# Patient Record
Sex: Female | Born: 1945 | Race: White | Hispanic: No | Marital: Married | Smoking: Never smoker
Health system: Southern US, Community
[De-identification: ages and names within clinical notes are randomized; demographics above are authoritative.]

## PROBLEM LIST (undated history)

## (undated) DIAGNOSIS — E039 Hypothyroidism, unspecified: Secondary | ICD-10-CM

## (undated) DIAGNOSIS — I10 Essential (primary) hypertension: Secondary | ICD-10-CM

## (undated) DIAGNOSIS — H269 Unspecified cataract: Secondary | ICD-10-CM

## (undated) DIAGNOSIS — H35039 Hypertensive retinopathy, unspecified eye: Secondary | ICD-10-CM

## (undated) DIAGNOSIS — M810 Age-related osteoporosis without current pathological fracture: Secondary | ICD-10-CM

## (undated) DIAGNOSIS — N200 Calculus of kidney: Secondary | ICD-10-CM

## (undated) DIAGNOSIS — M1990 Unspecified osteoarthritis, unspecified site: Secondary | ICD-10-CM

## (undated) HISTORY — PX: OTHER SURGICAL HISTORY: SHX169

## (undated) HISTORY — PX: TUBAL LIGATION: SHX77

## (undated) HISTORY — DX: Unspecified cataract: H26.9

## (undated) HISTORY — PX: CATARACT EXTRACTION: SUR2

## (undated) HISTORY — PX: EYE SURGERY: SHX253

## (undated) HISTORY — PX: ABDOMINAL HYSTERECTOMY: SHX81

## (undated) HISTORY — DX: Age-related osteoporosis without current pathological fracture: M81.0

## (undated) HISTORY — PX: COLONOSCOPY: SHX174

## (undated) HISTORY — DX: Hypertensive retinopathy, unspecified eye: H35.039

---

## 1970-10-11 HISTORY — PX: CHOLECYSTECTOMY: SHX55

## 2003-09-17 DIAGNOSIS — R319 Hematuria, unspecified: Secondary | ICD-10-CM | POA: Insufficient documentation

## 2004-12-04 DIAGNOSIS — N951 Menopausal and female climacteric states: Secondary | ICD-10-CM | POA: Insufficient documentation

## 2004-12-04 DIAGNOSIS — M8000XD Age-related osteoporosis with current pathological fracture, unspecified site, subsequent encounter for fracture with routine healing: Secondary | ICD-10-CM | POA: Insufficient documentation

## 2011-05-17 ENCOUNTER — Other Ambulatory Visit: Payer: Self-pay | Admitting: Urology

## 2011-05-17 DIAGNOSIS — N200 Calculus of kidney: Secondary | ICD-10-CM

## 2011-05-31 ENCOUNTER — Other Ambulatory Visit: Payer: Self-pay | Admitting: Urology

## 2011-05-31 DIAGNOSIS — N200 Calculus of kidney: Secondary | ICD-10-CM

## 2011-06-01 ENCOUNTER — Other Ambulatory Visit: Payer: Self-pay | Admitting: Urology

## 2011-06-01 ENCOUNTER — Encounter (HOSPITAL_COMMUNITY): Payer: BC Managed Care – PPO

## 2011-06-01 LAB — CBC
Hemoglobin: 12.3 g/dL (ref 12.0–15.0)
MCH: 29.6 pg (ref 26.0–34.0)
MCV: 87.7 fL (ref 78.0–100.0)
Platelets: 223 10*3/uL (ref 150–400)
RBC: 4.15 MIL/uL (ref 3.87–5.11)
WBC: 7.3 10*3/uL (ref 4.0–10.5)

## 2011-06-01 LAB — SURGICAL PCR SCREEN
MRSA, PCR: POSITIVE — AB
Staphylococcus aureus: POSITIVE — AB

## 2011-06-01 LAB — BASIC METABOLIC PANEL
CO2: 27 mEq/L (ref 19–32)
Calcium: 10 mg/dL (ref 8.4–10.5)
Chloride: 108 mEq/L (ref 96–112)
Glucose, Bld: 67 mg/dL — ABNORMAL LOW (ref 70–99)
Sodium: 144 mEq/L (ref 135–145)

## 2011-06-07 ENCOUNTER — Ambulatory Visit (HOSPITAL_COMMUNITY)
Admission: RE | Admit: 2011-06-07 | Discharge: 2011-06-07 | Disposition: A | Discharge status: Home / Self Care | Payer: BC Managed Care – PPO | Source: Ambulatory Visit | Attending: Urology | Admitting: Urology

## 2011-06-07 ENCOUNTER — Ambulatory Visit (HOSPITAL_COMMUNITY): Payer: BC Managed Care – PPO

## 2011-06-07 ENCOUNTER — Inpatient Hospital Stay (HOSPITAL_COMMUNITY)
Admission: RE | Admit: 2011-06-07 | Discharge: 2011-06-10 | DRG: 305 | Disposition: A | Discharge status: Home / Self Care | Payer: BC Managed Care – PPO | Source: Ambulatory Visit | Attending: Urology | Admitting: Urology

## 2011-06-07 DIAGNOSIS — N200 Calculus of kidney: Secondary | ICD-10-CM

## 2011-06-07 DIAGNOSIS — E7800 Pure hypercholesterolemia, unspecified: Secondary | ICD-10-CM | POA: Diagnosis present

## 2011-06-07 DIAGNOSIS — R339 Retention of urine, unspecified: Secondary | ICD-10-CM | POA: Diagnosis not present

## 2011-06-07 DIAGNOSIS — I10 Essential (primary) hypertension: Secondary | ICD-10-CM | POA: Diagnosis present

## 2011-06-07 DIAGNOSIS — Z01810 Encounter for preprocedural cardiovascular examination: Secondary | ICD-10-CM

## 2011-06-07 DIAGNOSIS — Z01812 Encounter for preprocedural laboratory examination: Secondary | ICD-10-CM

## 2011-06-07 LAB — BASIC METABOLIC PANEL
BUN: 14 mg/dL (ref 6–23)
Chloride: 108 mEq/L (ref 96–112)
GFR calc Af Amer: >60 mL/min (ref >60)
GFR calc non Af Amer: >60 mL/min (ref >60)
Potassium: 4.1 mEq/L (ref 3.5–5.1)
Sodium: 138 mEq/L (ref 135–145)

## 2011-06-07 LAB — TYPE AND SCREEN
ABO/RH(D): A POS
Antibody Screen: NEGATIVE

## 2011-06-07 LAB — ABO/RH: ABO/RH(D): A POS

## 2011-06-07 LAB — CBC
HCT: 29.4 % — ABNORMAL LOW (ref 36.0–46.0)
MCHC: 34.7 g/dL (ref 30.0–36.0)
Platelets: 185 10*3/uL (ref 150–400)
RDW: 13 % (ref 11.5–15.5)
WBC: 12.1 10*3/uL — ABNORMAL HIGH (ref 4.0–10.5)

## 2011-06-07 MED ORDER — IOHEXOL 300 MG/ML  SOLN
100.0000 mL | Freq: Once | INTRAMUSCULAR | Status: AC | PRN
  Administered 2011-06-07: 60 mL via INTRATHECAL

## 2011-06-08 ENCOUNTER — Ambulatory Visit (HOSPITAL_COMMUNITY): Payer: BC Managed Care – PPO

## 2011-06-08 LAB — BASIC METABOLIC PANEL
Chloride: 105 mEq/L (ref 96–112)
GFR calc Af Amer: >60 mL/min (ref >60)
GFR calc non Af Amer: 51 mL/min — ABNORMAL LOW (ref >60)
Glucose, Bld: 197 mg/dL — ABNORMAL HIGH (ref 70–99)
Potassium: 4.5 mEq/L (ref 3.5–5.1)
Sodium: 137 mEq/L (ref 135–145)

## 2011-06-08 LAB — CBC
Hemoglobin: 8.9 g/dL — ABNORMAL LOW (ref 12.0–15.0)
MCHC: 35.6 g/dL (ref 30.0–36.0)
WBC: 9.1 10*3/uL (ref 4.0–10.5)

## 2011-06-25 ENCOUNTER — Other Ambulatory Visit: Payer: Self-pay | Admitting: Urology

## 2011-06-25 ENCOUNTER — Encounter (HOSPITAL_COMMUNITY): Payer: BC Managed Care – PPO

## 2011-06-25 LAB — BASIC METABOLIC PANEL
CO2: 26 mEq/L (ref 19–32)
Glucose, Bld: 72 mg/dL (ref 70–99)
Potassium: 3.9 mEq/L (ref 3.5–5.1)
Sodium: 144 mEq/L (ref 135–145)

## 2011-06-25 LAB — CBC
HCT: 24.7 % — ABNORMAL LOW (ref 36.0–46.0)
Hemoglobin: 8.1 g/dL — ABNORMAL LOW (ref 12.0–15.0)
MCH: 28.2 pg (ref 26.0–34.0)
RBC: 2.87 MIL/uL — ABNORMAL LOW (ref 3.87–5.11)

## 2011-06-30 ENCOUNTER — Ambulatory Visit (HOSPITAL_COMMUNITY)
Admission: RE | Admit: 2011-06-30 | Discharge: 2011-06-30 | Disposition: A | Discharge status: Home / Self Care | Payer: BC Managed Care – PPO | Source: Ambulatory Visit | Attending: Urology | Admitting: Urology

## 2011-06-30 ENCOUNTER — Ambulatory Visit (HOSPITAL_COMMUNITY): Payer: BC Managed Care – PPO

## 2011-06-30 DIAGNOSIS — N200 Calculus of kidney: Secondary | ICD-10-CM | POA: Insufficient documentation

## 2011-06-30 DIAGNOSIS — I10 Essential (primary) hypertension: Secondary | ICD-10-CM | POA: Insufficient documentation

## 2011-06-30 DIAGNOSIS — N1330 Unspecified hydronephrosis: Secondary | ICD-10-CM | POA: Insufficient documentation

## 2011-06-30 DIAGNOSIS — Z01812 Encounter for preprocedural laboratory examination: Secondary | ICD-10-CM | POA: Insufficient documentation

## 2011-06-30 LAB — TYPE AND SCREEN: Antibody Screen: NEGATIVE

## 2011-07-01 NOTE — Op Note (Signed)
NAME:  LATANZA, PFEFFERKORN NO.:  [id number]  MEDICAL RECORD NO.:  [id number]  LOCATION:  DAYL                         FACILITY:  [facility]  PHYSICIAN:  Valetta Fuller, MD    DATE OF BIRTH:  Oct 25, 1945  DATE OF PROCEDURE: DATE OF DISCHARGE:                              OPERATIVE REPORT   PREOPERATIVE DIAGNOSIS:  Left renal calculi.  POSTOPERATIVE DIAGNOSIS:  Left renal calculi.  PROCEDURE PERFORMED:  Second-look nephroscopy with percutaneous nephrolithotomy by a holmium laser lithotripsy.  SURGEON:  Valetta Fuller, MD  ANESTHESIA:  General.  INDICATIONS:  Ms. Bowler is 65 years of age.  The patient was sent to see [place] for further evaluation and treatment of multiple left renal calculi.  The largest stone was approximately 2 cm in her left renal pelvis with mild hydronephrosis.  She had additional stone burden as well.  We felt that given the amount of stone burden she had, she would be a better candidate for percutaneous approach versus lithotripsy.  The patient underwent a percutaneous nephrolithotomy approximately 3 weeks ago.  At the time of surgery, we were able to debulk the stone considerably.  There was some migration of the stone into an upper pole position and unfortunately access was limited and visualization poor near the completion of the procedure.  We felt it would be better to complete the procedure at a later date.  The nephrostomy tube was left and the patient was able to be discharged home.  She does have a double- J stent and as well as a nephrostomy tube that has been plugged for the last several weeks.  Clinically, she has done rather well.  Followup imaging revealed primarily a single, approximately 10 cm fragment now in the upper aspect of the kidney along with a tiny stone in the lower pole.  She presents now for attempted completion of her definitive stone management.  TECHNIQUE AND FINDINGS:  The patient was brought to the operating  room. She had successful induction of general endotracheal anesthesia and was then placed in a prone position and prepped draped in the usual manner. All extremities carefully padded.  She had PAS compression boots and received perioperative gentamicin.  She was prepped draped in this usual manner.  Appropriate surgical time-out was performed.  The nephrostomy tube was removed.  The nephrostomy track was then visualized with a flexible cystoscope.  Visualization was excellent.  In the upper aspect of the pelvis, there was an adherent fragment measuring approximately 10 mm.  A smaller 3 mm stone was encountered in the lower pole, which was basket extracted.  The largest stone was broken up into innumerable pieces utilizing a holmium laser lithotriptor fiber.  The largest 6 to 8 fragments were basket extracted and at the completion of the procedure, it appeared that she only had some tiny 1-2 mm fragments.  The double-J stent was left indwelling. Her nephrostomy tube was removed.  There was no active bleeding or other complications and the patient was brought to recovery room in stable condition.     Valetta Fuller, MD     DSG/MEDQ  D:  06/30/2011  T:  06/30/2011  Job:  [id number]  Electronically Signed by Barron Alvine M.D. on 07/01/2011 04:52:57 PM

## 2011-07-01 NOTE — Op Note (Signed)
NAME:  Meredith Shaw, Meredith Shaw NO.:  [id number]  MEDICAL RECORD NO.:  [id number]  LOCATION:  1442                         FACILITY:  [facility]  PHYSICIAN:  Valetta Fuller, MD    DATE OF BIRTH:  [birth year]  DATE OF PROCEDURE:  06/07/2011 DATE OF DISCHARGE:                              OPERATIVE REPORT   PREOPERATIVE DIAGNOSIS:  Multiple left renal calculi.  POSTOPERATIVE DIAGNOSIS:  Multiple left renal calculi.  PROCEDURE PERFORMED:  Left percutaneous nephrolithotomy.  SURGEON:  Valetta Fuller, MD  ASSISTANT:  Jodi Marble. Fredia Sorrow, MD, from Interventional Radiology.  ANESTHESIA:  General endotracheal.  INDICATIONS:  Meredith Shaw is 65 years of age.  She was sent to see [place] for discussion with regard to management options for multiple left renal calculi.  The patient originally had evaluation at [facility]. She had been complaining of intermittent left flank pain.  A CT at that time showed approximately a 1 x 2 cm stone in her left renal pelvis with mild hydronephrosis and an additional 1 cm stone in the lower pole of her kidney.  Overall, she was felt to have approximately 3 cm stone burden in the left kidney with minimal dilation and some intermittent discomfort.  We discussed with her the options for management.  We felt that the procedure that had the best opportunity to render her stone- free was a percutaneous approach.  We discussed that along with the options of lithotripsy plus or minus double-J stent.  After discussion, she elected to proceed with a percutaneous approach.  She appeared to understand the advantages and disadvantages, potential complications of this.  The morning of the surgery, the patient went to Interventional Radiology where access was obtained.  Dr. Fredia Sorrow did have some difficulty obtaining proper access and there was some extravasation of contrast.  Nephrostomy tube did, however, appeared to be well positioned.  The patient  received perioperative antibiotics with ciprofloxacin and placement of PAS compression boots.  TECHNIQUE AND FINDINGS:  The patient was brought to the operating room where she had successful induction of general endotracheal anesthesia. Foley catheter was placed.  The patient was then placed in the prone position, taking great care to pad all extremities.  She was then prepped and draped in the usual manner.  [facility] then came in to the operating room.  Dr. Fredia Sorrow placed a wire down to the bladder.  A peel-away sheath was placed and a second safety wire was placed.  Fascial dilating balloon was then utilized and the access sheath was placed into the left kidney.  With that, a fair amount of blood was noted emanating from the access sheath.  For that reason, the balloon was reinserted.  This was held with tamponade pressure for several minutes and the sheath was then advanced further.  With deflation of the balloon, there did not appear to be any ongoing significant bleeding, but just some oozing.  The nephroscope was then placed.  The guidewire was followed and we initially encountered the stone in the lower pole.  This was broken up with the lithotrite instrument and pieces were removed with a three-pronged grasper.  The larger stone in the renal pelvis  was then encountered.  The access was just beneath the stone.  Some torquing of the scope and access sheath was necessary.  We were able to debulk the stone, but a portion of the stone did appear to migrate into a more upper pole position and access was not obtainable.  We felt this stone probably could be accessed with flexible instrumentation, but at that point there was a moderate amount of oozing and bleeding and visualization was poor.  For that reason, we felt that that would be better dealt with another day either by lithotripsy or second look.  Over one of the safety wires down to her bladder, a double-J stent  was placed.  A nephrostomy tube was then utilized which was a 20-French Councill tip Foley.  There was some extravasation of contrast on the nephrogram, but the stent and nephrostomy tube appeared to be well positioned.  There did not appear to be any significant ongoing bleeding.  The nephrostomy tube was secured to her skin and hooked up to gravity drainage.  The patient was brought to recovery room in stable condition.  Estimated blood loss was 700 cc.  No other complications or problems occurred.     Valetta Fuller, MD     DSG/MEDQ  D:  06/08/2011  T:  06/09/2011  Job:  [id number]  Electronically Signed by Barron Alvine M.D. on 07/01/2011 04:55:49 PM

## 2011-07-04 NOTE — H&P (Signed)
  NAME:  Meredith Shaw, Meredith Shaw NO.:  [id number]  MEDICAL RECORD NO.:  [id number]  LOCATION:  DAYL                         FACILITY:  [facility]  PHYSICIAN:  Valetta Fuller, MD    DATE OF BIRTH:  04/24/1946  DATE OF ADMISSION:  06/30/2011 DATE OF DISCHARGE:  06/30/2011                             HISTORY & PHYSICAL   ADMISSION DIAGNOSIS:  Left renal calculi.  HISTORY OF PRESENT ILLNESS:  Meredith Shaw was sent to [place] from an outside physician in clinic for consideration for percutaneous management of multiple left renal calculi.  She is 65 years of age.  The patient was noted to have multiple left renal calculi, the largest was approximately 2 cm in size.  We felt the stone burden would be best approached with the percutaneous approach and she accepted that recommendation.  The patient had placement of a percutaneous nephrostomy tube by Interventional Radiology earlier today and is going to undergo a percutaneous nephrolithotomy with admission to the hospital for routine postoperative care.  She has had some mild intermittent left flank discomfort.  She has had no other systemic complaints.  PAST MEDICAL HISTORY:  Notable for; 1. Hypertension. 2. Hypothyroidism.  MEDICATIONS:  Include Vicodin, Levothroid, metoprolol, and variety of supplements which have been on hold.  ALLERGIES:  PENICILLIN, which results in rash.  SOCIAL HISTORY:  Noncontributory.  REVIEW OF SYSTEMS:  Positive for mild left intermittent flank pain, mild bladder overactivity.  No other systemic complaints.  PHYSICAL EXAMINATION:  GENERAL:  The patient is well-developed, well- nourished female in no acute distress.  She is afebrile. VITAL SIGNS:  Her blood pressure was 186/98 with a pulse of 59, respiratory rate was 16. NECK:  Showed no gross JVD. RESPIRATORY:  Effort was normal. HEART:  Regular rate and rhythm. ABDOMEN:  Obese, but soft, nontender.  Left side nephrostomy tube in good  position. EXTREMITIES:  No gross edema.  ASSESSMENT:  Multiple left renal calculi.  The patient has had a percutaneous nephrostomy tube placed.  She will undergo attempted definitive stone management today with admission to the hospital for routine postoperative care.     Valetta Fuller, MD     DSG/MEDQ  D:  07/02/2011  T:  07/02/2011  Job:  [id number]  Electronically Signed by Barron Alvine M.D. on 07/04/2011 07:25:22 PM

## 2011-07-04 NOTE — Discharge Summary (Signed)
  NAME:  Meredith Shaw, Meredith Shaw NO.:  [id number]  MEDICAL RECORD NO.:  [id number]  LOCATION:  1442                         FACILITY:  [facility]  PHYSICIAN:  Valetta Fuller, MD    DATE OF BIRTH:  [birth year]  DATE OF ADMISSION:  06/07/2011 DATE OF DISCHARGE:  06/10/2011                              DISCHARGE SUMMARY   DISCHARGE DIAGNOSES: 1. Left renal calculi (multiple). 2. Acute urinary retention (postoperative). 3. Hypertension.  PROCEDURES PERFORMED: 1. Placement of percutaneous left nephrostomy tube by Interventional     Radiology on June 07, 2011. 2. Left percutaneous nephrolithotomy on June 07, 2011.  HOSPITAL COURSE:  The patient had placement of a percutaneous left nephrostomy tube by Dr. Irish Lack from Interventional Radiology. The patient was subsequently taken to the operating room for attempted left percutaneous nephrolithotomy.  The patient had multiple left renal calculi.  Smaller stones were dealt with effectively.  The largest stone was partially fragmented, but fragment in the upper aspect was unable to be completely dealt with at the initial setting.  We did not feel that visualization with flexible instruments would be feasible due to bleeding and therefore, we felt that she would require a second-look procedure to definitively manage her situation.  The nephrostomy tube was placed.  The patient had a relatively uneventful postoperative course other than ongoing issues with urinary retention.  On postoperative day 1, she still complained of some flank discomfort, but remained afebrile with stable vital signs.  Postoperative hemoglobin was 8.9.  She continued to have excellent urinary output.  Follow-up CT revealed approximately a residual 10-mm fragment in her left kidney. The patient's nephrostomy tube was clamped and she was eventually able to tolerate that.  The patient unfortunately was unable to void successfully after removal of her  Foley catheter.  Flomax was added and the patient was continued on intermittent catheterization.  She continued to not be able to void successfully and therefore, we elected to discharge her on June 10, 2011 with an indwelling Foley catheter with plans to perform a voiding trial in our office in a few days and also to schedule her for a return visit for a second look to definitively manage the residual stone fragment.  DISPOSITION:  The patient was discharged home.  She is to resume normal medications.  She will take Vicodin for pain management.  Arrangements will be made for both follow-up in our office for voiding trial as well as for additional definitive surgical intervention.     Valetta Fuller, MD    DSG/MEDQ  D:  07/02/2011  T:  07/02/2011  Job:  [id number]  Electronically Signed by Barron Alvine M.D. on 07/04/2011 07:25:10 PM

## 2012-05-10 ENCOUNTER — Other Ambulatory Visit (HOSPITAL_COMMUNITY): Payer: Self-pay | Admitting: Orthopaedic Surgery

## 2012-05-16 DIAGNOSIS — N3020 Other chronic cystitis without hematuria: Secondary | ICD-10-CM | POA: Diagnosis not present

## 2012-05-16 DIAGNOSIS — N200 Calculus of kidney: Secondary | ICD-10-CM | POA: Diagnosis not present

## 2012-05-30 ENCOUNTER — Encounter (HOSPITAL_COMMUNITY): Payer: Self-pay | Admitting: Pharmacy Technician

## 2012-06-05 ENCOUNTER — Ambulatory Visit (HOSPITAL_COMMUNITY)
Admission: RE | Admit: 2012-06-05 | Discharge: 2012-06-05 | Disposition: A | Discharge status: Home / Self Care | Payer: BC Managed Care – PPO | Source: Ambulatory Visit | Attending: Orthopaedic Surgery | Admitting: Orthopaedic Surgery

## 2012-06-05 ENCOUNTER — Encounter (HOSPITAL_COMMUNITY): Payer: Self-pay

## 2012-06-05 ENCOUNTER — Encounter (HOSPITAL_COMMUNITY)
Admission: RE | Admit: 2012-06-05 | Discharge: 2012-06-05 | Disposition: A | Discharge status: Home / Self Care | Payer: BC Managed Care – PPO | Source: Ambulatory Visit | Attending: Orthopaedic Surgery | Admitting: Orthopaedic Surgery

## 2012-06-05 DIAGNOSIS — I10 Essential (primary) hypertension: Secondary | ICD-10-CM | POA: Diagnosis not present

## 2012-06-05 DIAGNOSIS — M1710 Unilateral primary osteoarthritis, unspecified knee: Secondary | ICD-10-CM | POA: Diagnosis not present

## 2012-06-05 DIAGNOSIS — Z01810 Encounter for preprocedural cardiovascular examination: Secondary | ICD-10-CM | POA: Diagnosis not present

## 2012-06-05 DIAGNOSIS — Z01812 Encounter for preprocedural laboratory examination: Secondary | ICD-10-CM | POA: Insufficient documentation

## 2012-06-05 DIAGNOSIS — Z01818 Encounter for other preprocedural examination: Secondary | ICD-10-CM | POA: Diagnosis not present

## 2012-06-05 HISTORY — DX: Unspecified osteoarthritis, unspecified site: M19.90

## 2012-06-05 HISTORY — DX: Calculus of kidney: N20.0

## 2012-06-05 HISTORY — DX: Hypothyroidism, unspecified: E03.9

## 2012-06-05 HISTORY — DX: Essential (primary) hypertension: I10

## 2012-06-05 LAB — URINALYSIS, ROUTINE W REFLEX MICROSCOPIC
Glucose, UA: NEGATIVE mg/dL
Ketones, ur: NEGATIVE mg/dL
Nitrite: NEGATIVE
Specific Gravity, Urine: 1.026 (ref 1.005–1.030)
pH: 5 (ref 5.0–8.0)

## 2012-06-05 LAB — URINE MICROSCOPIC-ADD ON

## 2012-06-05 LAB — CBC
HCT: 36 % (ref 36.0–46.0)
Hemoglobin: 12.7 g/dL (ref 12.0–15.0)
MCV: 86.5 fL (ref 78.0–100.0)
RBC: 4.16 MIL/uL (ref 3.87–5.11)
RDW: 13.1 % (ref 11.5–15.5)
WBC: 6.7 10*3/uL (ref 4.0–10.5)

## 2012-06-05 LAB — BASIC METABOLIC PANEL
CO2: 26 mEq/L (ref 19–32)
Chloride: 102 mEq/L (ref 96–112)
Creatinine, Ser: 1.14 mg/dL — ABNORMAL HIGH (ref 0.50–1.10)
GFR calc Af Amer: 57 mL/min — ABNORMAL LOW (ref >90)
Potassium: 4.3 mEq/L (ref 3.5–5.1)
Sodium: 137 mEq/L (ref 135–145)

## 2012-06-05 LAB — PROTIME-INR: INR: 1 (ref 0.00–1.49)

## 2012-06-05 LAB — SURGICAL PCR SCREEN
MRSA, PCR: NEGATIVE
Staphylococcus aureus: NEGATIVE

## 2012-06-05 NOTE — Pre-Procedure Instructions (Signed)
PREOP CBC, BMET, PT, UA, T/S, EKG, CXR WERE DONE TODAY AT [facility]. PREOP TEACHING WAS DONE USING TEACH BACK METHOD.

## 2012-06-05 NOTE — Pre-Procedure Instructions (Signed)
SHERRY AT DR. C. BLACKMAN'S OFFICE NOTIFIED TO ASK DR. BLACKMAN TO REVIEW PT'S URINALYSIS AND BMET REPORTS IN EPIC FOR ABNORMAL'S.

## 2012-06-05 NOTE — Patient Instructions (Signed)
YOUR SURGERY IS SCHEDULED ON:   Friday  8/30 AT 2:36 PM  REPORT TO [place] [facility] AT:  12:00 PM      PHONE # FOR SHORT STAY IS [phone]  DO NOT EAT ANYTHING AFTER MIDNIGHT THE NIGHT BEFORE YOUR SURGERY.   NO FOOD, NO CHEWING GUM, NO MINTS, NO CANDIES, NO CHEWING TOBACCO.  YOU MAY HAVE CLEAR LIQUIDS TO DRINK FROM MIDNIGHT UNTIL 8:30 AM DAY OF SURGERY--LIKE WATER, APPLE JUICE, SODA.  NOTHING TO DRINK AFTER 8:30 AM THE DAY OF SURGERY.  PLEASE TAKE THE FOLLOWING MEDICATIONS THE AM OF YOUR SURGERY WITH A FEW SIPS OF WATER:  LEVOTHYROXINE, METOPROLOL.    IF YOU USE INHALERS--USE YOUR INHALERS THE AM OF YOUR SURGERY AND BRING INHALERS TO THE HOSPITAL -TAKE TO SURGERY.    IF YOU ARE DIABETIC:  DO NOT TAKE ANY DIABETIC MEDICATIONS THE AM OF YOUR SURGERY.  IF YOU TAKE INSULIN IN THE EVENINGS--PLEASE ONLY TAKE 1/2 NORMAL EVENING DOSE THE NIGHT BEFORE YOUR SURGERY.  NO INSULIN THE AM OF YOUR SURGERY.  IF YOU HAVE SLEEP APNEA AND USE CPAP OR BIPAP--PLEASE BRING THE MASK --NOT THE MACHINE-NOT THE TUBING   -JUST THE MASK. DO NOT BRING VALUABLES, MONEY, CREDIT CARDS.  CONTACT LENS, DENTURES / PARTIALS, GLASSES SHOULD NOT BE WORN TO SURGERY AND IN MOST CASES-HEARING AIDS WILL NEED TO BE REMOVED.  BRING YOUR GLASSES CASE, ANY EQUIPMENT NEEDED FOR YOUR CONTACT LENS. FOR PATIENTS ADMITTED TO THE HOSPITAL--CHECK OUT TIME THE DAY OF DISCHARGE IS 11:00 AM.  ALL INPATIENT ROOMS ARE PRIVATE - WITH BATHROOM, TELEPHONE, TELEVISION AND WIFI INTERNET. IF YOU ARE BEING DISCHARGED THE SAME DAY OF YOUR SURGERY--YOU CAN NOT DRIVE YOURSELF HOME--AND SHOULD NOT GO HOME ALONE BY TAXI OR BUS.  NO DRIVING OR OPERATING MACHINERY FOR 24 HOURS FOLLOWING ANESTHESIA / PAIN MEDICATIONS.                            SPECIAL INSTRUCTIONS:  CHLORHEXIDINE SOAP SHOWER (other brand names are Betasept and Hibiclens ) PLEASE SHOWER WITH CHLORHEXIDINE THE NIGHT BEFORE YOUR SURGERY AND THE AM OF YOUR SURGERY. DO NOT USE  CHLORHEXIDINE ON YOUR FACE OR PRIVATE AREAS--YOU MAY USE YOUR NORMAL SOAP THOSE AREAS AND YOUR NORMAL SHAMPOO.  WOMEN SHOULD AVOID SHAVING UNDER ARMS AND SHAVING LEGS 48 HOURS BEFORE USING CHLORHEXIDINE TO AVOID SKIN IRRITATION.  DO NOT USE IF ALLERGIC TO CHLORHEXIDINE.  PLEASE READ OVER ANY  FACT SHEETS THAT YOU WERE GIVEN: MRSA INFORMATION, BLOOD TRANSFUSION INFORMATION

## 2012-06-09 ENCOUNTER — Inpatient Hospital Stay (HOSPITAL_COMMUNITY): Payer: BC Managed Care – PPO | Admitting: Anesthesiology

## 2012-06-09 ENCOUNTER — Encounter (HOSPITAL_COMMUNITY): Payer: Self-pay | Admitting: Anesthesiology

## 2012-06-09 ENCOUNTER — Encounter (HOSPITAL_COMMUNITY): Payer: Self-pay | Admitting: *Deleted

## 2012-06-09 ENCOUNTER — Inpatient Hospital Stay (HOSPITAL_COMMUNITY)
Admission: RE | Admit: 2012-06-09 | Discharge: 2012-06-12 | DRG: 209 | Disposition: A | Discharge status: Home / Self Care | Payer: BC Managed Care – PPO | Source: Ambulatory Visit | Attending: Orthopaedic Surgery | Admitting: Orthopaedic Surgery

## 2012-06-09 ENCOUNTER — Inpatient Hospital Stay (HOSPITAL_COMMUNITY): Payer: BC Managed Care – PPO

## 2012-06-09 ENCOUNTER — Encounter (HOSPITAL_COMMUNITY)
Admission: RE | Disposition: A | Discharge status: Home / Self Care | Payer: Self-pay | Source: Ambulatory Visit | Attending: Orthopaedic Surgery

## 2012-06-09 DIAGNOSIS — D62 Acute posthemorrhagic anemia: Secondary | ICD-10-CM | POA: Diagnosis not present

## 2012-06-09 DIAGNOSIS — E119 Type 2 diabetes mellitus without complications: Secondary | ICD-10-CM | POA: Diagnosis present

## 2012-06-09 DIAGNOSIS — M179 Osteoarthritis of knee, unspecified: Secondary | ICD-10-CM

## 2012-06-09 DIAGNOSIS — I10 Essential (primary) hypertension: Secondary | ICD-10-CM | POA: Diagnosis present

## 2012-06-09 DIAGNOSIS — E039 Hypothyroidism, unspecified: Secondary | ICD-10-CM | POA: Diagnosis present

## 2012-06-09 DIAGNOSIS — K08109 Complete loss of teeth, unspecified cause, unspecified class: Secondary | ICD-10-CM | POA: Diagnosis present

## 2012-06-09 DIAGNOSIS — Z4789 Encounter for other orthopedic aftercare: Secondary | ICD-10-CM | POA: Diagnosis not present

## 2012-06-09 DIAGNOSIS — M1710 Unilateral primary osteoarthritis, unspecified knee: Secondary | ICD-10-CM | POA: Diagnosis not present

## 2012-06-09 DIAGNOSIS — Z96659 Presence of unspecified artificial knee joint: Secondary | ICD-10-CM | POA: Diagnosis not present

## 2012-06-09 DIAGNOSIS — IMO0002 Reserved for concepts with insufficient information to code with codable children: Secondary | ICD-10-CM | POA: Diagnosis not present

## 2012-06-09 DIAGNOSIS — Z79899 Other long term (current) drug therapy: Secondary | ICD-10-CM | POA: Diagnosis not present

## 2012-06-09 DIAGNOSIS — Z87442 Personal history of urinary calculi: Secondary | ICD-10-CM | POA: Diagnosis not present

## 2012-06-09 DIAGNOSIS — M25569 Pain in unspecified knee: Secondary | ICD-10-CM | POA: Diagnosis not present

## 2012-06-09 DIAGNOSIS — G8918 Other acute postprocedural pain: Secondary | ICD-10-CM | POA: Diagnosis not present

## 2012-06-09 HISTORY — PX: TOTAL KNEE ARTHROPLASTY: SHX125

## 2012-06-09 LAB — GLUCOSE, CAPILLARY
Glucose-Capillary: 133 mg/dL — ABNORMAL HIGH (ref 70–99)
Glucose-Capillary: 167 mg/dL — ABNORMAL HIGH (ref 70–99)

## 2012-06-09 LAB — TYPE AND SCREEN: ABO/RH(D): A POS

## 2012-06-09 SURGERY — ARTHROPLASTY, KNEE, TOTAL
Anesthesia: General | Site: Knee | Laterality: Right | Wound class: Clean

## 2012-06-09 MED ORDER — PROPOFOL 10 MG/ML IV EMUL
INTRAVENOUS | Status: DC | PRN
  Administered 2012-06-09: 200 mg via INTRAVENOUS

## 2012-06-09 MED ORDER — SUCCINYLCHOLINE CHLORIDE 20 MG/ML IJ SOLN
INTRAMUSCULAR | Status: DC | PRN
  Administered 2012-06-09: 100 mg via INTRAVENOUS

## 2012-06-09 MED ORDER — OXYCODONE HCL 5 MG PO TABS
5.0000 mg | ORAL_TABLET | ORAL | Status: DC | PRN
  Administered 2012-06-10 – 2012-06-12 (×11): 10 mg via ORAL
  Filled 2012-06-09 (×11): qty 2

## 2012-06-09 MED ORDER — INSULIN ASPART 100 UNIT/ML ~~LOC~~ SOLN
0.0000 [IU] | Freq: Three times a day (TID) | SUBCUTANEOUS | Status: DC
  Administered 2012-06-12: 3 [IU] via SUBCUTANEOUS

## 2012-06-09 MED ORDER — SODIUM CHLORIDE 0.9 % IV SOLN
INTRAVENOUS | Status: DC
  Administered 2012-06-09 – 2012-06-11 (×3): via INTRAVENOUS

## 2012-06-09 MED ORDER — MORPHINE SULFATE 2 MG/ML IJ SOLN: 2.0000 mg | INTRAMUSCULAR | Status: DC | PRN

## 2012-06-09 MED ORDER — LISINOPRIL 20 MG PO TABS
20.0000 mg | ORAL_TABLET | Freq: Every day | ORAL | Status: DC
  Administered 2012-06-09 – 2012-06-12 (×4): 20 mg via ORAL
  Filled 2012-06-09 (×5): qty 1

## 2012-06-09 MED ORDER — PHENOL 1.4 % MT LIQD: 1.0000 | OROMUCOSAL | Status: DC | PRN

## 2012-06-09 MED ORDER — MIDAZOLAM HCL 5 MG/5ML IJ SOLN
INTRAMUSCULAR | Status: DC | PRN
  Administered 2012-06-09: 2 mg via INTRAVENOUS

## 2012-06-09 MED ORDER — CLINDAMYCIN PHOSPHATE 600 MG/50ML IV SOLN
600.0000 mg | Freq: Four times a day (QID) | INTRAVENOUS | Status: AC
  Administered 2012-06-09 – 2012-06-10 (×2): 600 mg via INTRAVENOUS
  Filled 2012-06-09 (×2): qty 50

## 2012-06-09 MED ORDER — LABETALOL HCL 5 MG/ML IV SOLN
INTRAVENOUS | Status: DC | PRN
  Administered 2012-06-09 (×2): 5 mg via INTRAVENOUS

## 2012-06-09 MED ORDER — ROPIVACAINE HCL 5 MG/ML IJ SOLN
INTRAMUSCULAR | Status: DC | PRN
  Administered 2012-06-09: 30 mL

## 2012-06-09 MED ORDER — ONDANSETRON HCL 4 MG/2ML IJ SOLN
4.0000 mg | Freq: Four times a day (QID) | INTRAMUSCULAR | Status: DC | PRN
  Filled 2012-06-09: qty 2

## 2012-06-09 MED ORDER — SODIUM CHLORIDE 0.9 % IR SOLN
Status: DC | PRN
  Administered 2012-06-09: 3000 mL

## 2012-06-09 MED ORDER — METHOCARBAMOL 500 MG PO TABS
500.0000 mg | ORAL_TABLET | Freq: Four times a day (QID) | ORAL | Status: DC | PRN
  Administered 2012-06-10 – 2012-06-12 (×7): 500 mg via ORAL
  Filled 2012-06-09 (×7): qty 1

## 2012-06-09 MED ORDER — METOCLOPRAMIDE HCL 10 MG PO TABS: 5.0000 mg | ORAL_TABLET | Freq: Three times a day (TID) | ORAL | Status: DC | PRN

## 2012-06-09 MED ORDER — METOCLOPRAMIDE HCL 5 MG/ML IJ SOLN
5.0000 mg | Freq: Three times a day (TID) | INTRAMUSCULAR | Status: DC | PRN
  Administered 2012-06-09 – 2012-06-10 (×2): 10 mg via INTRAVENOUS
  Filled 2012-06-09 (×2): qty 2

## 2012-06-09 MED ORDER — ONDANSETRON HCL 4 MG PO TABS: 4.0000 mg | ORAL_TABLET | Freq: Four times a day (QID) | ORAL | Status: DC | PRN

## 2012-06-09 MED ORDER — LACTATED RINGERS IV SOLN
INTRAVENOUS | Status: DC
  Administered 2012-06-09 (×2): via INTRAVENOUS
  Administered 2012-06-09: 1000 mL via INTRAVENOUS

## 2012-06-09 MED ORDER — HYDROMORPHONE HCL PF 1 MG/ML IJ SOLN
0.2500 mg | INTRAMUSCULAR | Status: DC | PRN
  Administered 2012-06-09 (×2): 0.5 mg via INTRAVENOUS

## 2012-06-09 MED ORDER — LEVOTHYROXINE SODIUM 125 MCG PO TABS
125.0000 ug | ORAL_TABLET | Freq: Every day | ORAL | Status: DC
  Administered 2012-06-10 – 2012-06-12 (×3): 125 ug via ORAL
  Filled 2012-06-09 (×5): qty 1

## 2012-06-09 MED ORDER — LIDOCAINE HCL (CARDIAC) 20 MG/ML IV SOLN
INTRAVENOUS | Status: DC | PRN
  Administered 2012-06-09: 60 mg via INTRAVENOUS

## 2012-06-09 MED ORDER — NALOXONE HCL 0.4 MG/ML IJ SOLN: 0.4000 mg | INTRAMUSCULAR | Status: DC | PRN

## 2012-06-09 MED ORDER — METHOCARBAMOL 100 MG/ML IJ SOLN
500.0000 mg | Freq: Four times a day (QID) | INTRAVENOUS | Status: DC | PRN
  Administered 2012-06-09: 500 mg via INTRAVENOUS
  Filled 2012-06-09 (×2): qty 5

## 2012-06-09 MED ORDER — ALUM & MAG HYDROXIDE-SIMETH 200-200-20 MG/5ML PO SUSP: 30.0000 mL | ORAL | Status: DC | PRN

## 2012-06-09 MED ORDER — ONDANSETRON HCL 4 MG/2ML IJ SOLN
INTRAMUSCULAR | Status: DC | PRN
  Administered 2012-06-09: 4 mg via INTRAVENOUS

## 2012-06-09 MED ORDER — ONDANSETRON HCL 4 MG/2ML IJ SOLN
4.0000 mg | Freq: Four times a day (QID) | INTRAMUSCULAR | Status: DC | PRN
  Administered 2012-06-10: 4 mg via INTRAVENOUS

## 2012-06-09 MED ORDER — DIPHENHYDRAMINE HCL 12.5 MG/5ML PO ELIX: 12.5000 mg | ORAL_SOLUTION | ORAL | Status: DC | PRN

## 2012-06-09 MED ORDER — DIPHENHYDRAMINE HCL 50 MG/ML IJ SOLN: 12.5000 mg | Freq: Four times a day (QID) | INTRAMUSCULAR | Status: DC | PRN

## 2012-06-09 MED ORDER — FENTANYL CITRATE 0.05 MG/ML IJ SOLN
INTRAMUSCULAR | Status: DC | PRN
  Administered 2012-06-09 (×5): 50 ug via INTRAVENOUS

## 2012-06-09 MED ORDER — ZOLPIDEM TARTRATE 5 MG PO TABS: 5.0000 mg | ORAL_TABLET | Freq: Every evening | ORAL | Status: DC | PRN

## 2012-06-09 MED ORDER — CLINDAMYCIN PHOSPHATE 900 MG/50ML IV SOLN
900.0000 mg | INTRAVENOUS | Status: AC
  Administered 2012-06-09: 900 mg via INTRAVENOUS

## 2012-06-09 MED ORDER — LORATADINE 10 MG PO TABS
10.0000 mg | ORAL_TABLET | Freq: Every day | ORAL | Status: DC
  Administered 2012-06-09 – 2012-06-11 (×3): 10 mg via ORAL
  Filled 2012-06-09 (×4): qty 1

## 2012-06-09 MED ORDER — LACTATED RINGERS IV SOLN
INTRAVENOUS | Status: DC
  Administered 2012-06-09: 18:00:00 via INTRAVENOUS

## 2012-06-09 MED ORDER — MORPHINE SULFATE (PF) 1 MG/ML IV SOLN
INTRAVENOUS | Status: DC
  Administered 2012-06-09: 5 mg via INTRAVENOUS
  Administered 2012-06-09: 2 mg via INTRAVENOUS
  Administered 2012-06-09: 1 mg via INTRAVENOUS
  Administered 2012-06-10: 13:00:00 via INTRAVENOUS
  Administered 2012-06-10: 7 mg via INTRAVENOUS
  Filled 2012-06-09: qty 25

## 2012-06-09 MED ORDER — KETOROLAC TROMETHAMINE 15 MG/ML IJ SOLN
7.5000 mg | Freq: Four times a day (QID) | INTRAMUSCULAR | Status: AC
  Administered 2012-06-09 – 2012-06-10 (×4): 7.5 mg via INTRAVENOUS
  Filled 2012-06-09 (×3): qty 1

## 2012-06-09 MED ORDER — MIDAZOLAM HCL 2 MG/2ML IJ SOLN
1.0000 mg | INTRAMUSCULAR | Status: DC | PRN
  Administered 2012-06-09: 2 mg via INTRAVENOUS

## 2012-06-09 MED ORDER — VITAMIN D3 25 MCG (1000 UNIT) PO TABS
5000.0000 [IU] | ORAL_TABLET | Freq: Every day | ORAL | Status: DC
  Administered 2012-06-10 – 2012-06-12 (×3): 5000 [IU] via ORAL
  Filled 2012-06-09 (×4): qty 5

## 2012-06-09 MED ORDER — METOPROLOL TARTRATE 100 MG PO TABS
100.0000 mg | ORAL_TABLET | Freq: Two times a day (BID) | ORAL | Status: DC
  Administered 2012-06-09 – 2012-06-12 (×7): 100 mg via ORAL
  Filled 2012-06-09 (×8): qty 1

## 2012-06-09 MED ORDER — FENTANYL CITRATE 0.05 MG/ML IJ SOLN
50.0000 ug | INTRAMUSCULAR | Status: DC | PRN
  Administered 2012-06-09: 50 ug via INTRAVENOUS

## 2012-06-09 MED ORDER — SODIUM CHLORIDE 0.9 % IJ SOLN: 9.0000 mL | INTRAMUSCULAR | Status: DC | PRN

## 2012-06-09 MED ORDER — METFORMIN HCL 500 MG PO TABS
1000.0000 mg | ORAL_TABLET | Freq: Two times a day (BID) | ORAL | Status: DC
  Administered 2012-06-10 – 2012-06-12 (×5): 1000 mg via ORAL
  Filled 2012-06-09 (×8): qty 2

## 2012-06-09 MED ORDER — 0.9 % SODIUM CHLORIDE (POUR BTL) OPTIME
TOPICAL | Status: DC | PRN
  Administered 2012-06-09: 1000 mL

## 2012-06-09 MED ORDER — MORPHINE SULFATE (PF) 1 MG/ML IV SOLN
INTRAVENOUS | Status: AC
  Filled 2012-06-09: qty 25

## 2012-06-09 MED ORDER — DOCUSATE SODIUM 100 MG PO CAPS
100.0000 mg | ORAL_CAPSULE | Freq: Two times a day (BID) | ORAL | Status: DC
  Administered 2012-06-09 – 2012-06-12 (×5): 100 mg via ORAL
  Filled 2012-06-09 (×5): qty 1

## 2012-06-09 MED ORDER — ASPIRIN EC 325 MG PO TBEC
325.0000 mg | DELAYED_RELEASE_TABLET | Freq: Two times a day (BID) | ORAL | Status: DC
  Administered 2012-06-10 – 2012-06-12 (×4): 325 mg via ORAL
  Filled 2012-06-09 (×8): qty 1

## 2012-06-09 MED ORDER — VITAMIN D3 125 MCG (5000 UT) PO CAPS: 5000.0000 mg | ORAL_CAPSULE | Freq: Every day | ORAL | Status: DC

## 2012-06-09 MED ORDER — KETOROLAC TROMETHAMINE 15 MG/ML IJ SOLN
INTRAMUSCULAR | Status: AC
  Filled 2012-06-09: qty 1

## 2012-06-09 MED ORDER — ACETAMINOPHEN 650 MG RE SUPP: 650.0000 mg | Freq: Four times a day (QID) | RECTAL | Status: DC | PRN

## 2012-06-09 MED ORDER — ACETAMINOPHEN 325 MG PO TABS
650.0000 mg | ORAL_TABLET | Freq: Four times a day (QID) | ORAL | Status: DC | PRN
  Administered 2012-06-11: 650 mg via ORAL
  Filled 2012-06-09: qty 2

## 2012-06-09 MED ORDER — HYDROMORPHONE HCL PF 1 MG/ML IJ SOLN
INTRAMUSCULAR | Status: AC
  Filled 2012-06-09: qty 1

## 2012-06-09 MED ORDER — DIPHENHYDRAMINE HCL 12.5 MG/5ML PO ELIX: 12.5000 mg | ORAL_SOLUTION | Freq: Four times a day (QID) | ORAL | Status: DC | PRN

## 2012-06-09 MED ORDER — MENTHOL 3 MG MT LOZG: 1.0000 | LOZENGE | OROMUCOSAL | Status: DC | PRN

## 2012-06-09 MED ORDER — ACETAMINOPHEN 10 MG/ML IV SOLN
INTRAVENOUS | Status: DC | PRN
  Administered 2012-06-09: 1000 mg via INTRAVENOUS

## 2012-06-09 SURGICAL SUPPLY — 62 items
BAG SPEC THK2 15X12 ZIP CLS (MISCELLANEOUS) ×1
BAG ZIPLOCK 12X15 (MISCELLANEOUS) ×2 IMPLANT
BANDAGE ELASTIC 6 VELCRO ST LF (GAUZE/BANDAGES/DRESSINGS) ×2 IMPLANT
BANDAGE ESMARK 6X9 LF (GAUZE/BANDAGES/DRESSINGS) ×1 IMPLANT
BLADE SAG 18X100X1.27 (BLADE) ×2 IMPLANT
BLADE SAW SGTL 13.0X1.19X90.0M (BLADE) ×2 IMPLANT
BLADE SURG SZ11 CARB STEEL (BLADE) ×2 IMPLANT
BNDG CMPR 9X6 STRL LF SNTH (GAUZE/BANDAGES/DRESSINGS) ×1
BNDG ESMARK 6X9 LF (GAUZE/BANDAGES/DRESSINGS) ×2
BOWL SMART MIX CTS (DISPOSABLE) ×2 IMPLANT
CEMENT HV SMART SET (Cement) ×4 IMPLANT
CLOTH BEACON ORANGE TIMEOUT ST (SAFETY) ×2 IMPLANT
CUFF TOURN SGL QUICK 34 (TOURNIQUET CUFF) ×2
CUFF TRNQT CYL 34X4X40X1 (TOURNIQUET CUFF) ×1 IMPLANT
DRAPE EXTREMITY T 121X128X90 (DRAPE) ×2 IMPLANT
DRAPE LG THREE QUARTER DISP (DRAPES) ×2 IMPLANT
DRAPE POUCH INSTRU U-SHP 10X18 (DRAPES) ×2 IMPLANT
DRAPE U-SHAPE 47X51 STRL (DRAPES) ×2 IMPLANT
DRSG PAD ABDOMINAL 8X10 ST (GAUZE/BANDAGES/DRESSINGS) ×2 IMPLANT
DURAPREP 26ML APPLICATOR (WOUND CARE) ×2 IMPLANT
ELECT REM PT RETURN 9FT ADLT (ELECTROSURGICAL) ×2
ELECTRODE REM PT RTRN 9FT ADLT (ELECTROSURGICAL) ×1 IMPLANT
EVACUATOR 1/8 PVC DRAIN (DRAIN) ×2 IMPLANT
FACESHIELD LNG OPTICON STERILE (SAFETY) ×10 IMPLANT
GAUZE XEROFORM 5X9 LF (GAUZE/BANDAGES/DRESSINGS) ×2 IMPLANT
GLOVE BIO SURGEON STRL SZ7.5 (GLOVE) ×2 IMPLANT
GLOVE BIOGEL PI IND STRL 7.5 (GLOVE) ×1 IMPLANT
GLOVE BIOGEL PI IND STRL 8 (GLOVE) ×1 IMPLANT
GLOVE BIOGEL PI INDICATOR 7.5 (GLOVE) ×1
GLOVE BIOGEL PI INDICATOR 8 (GLOVE) ×1
GLOVE ECLIPSE 7.0 STRL STRAW (GLOVE) ×2 IMPLANT
GOWN STRL REIN XL XLG (GOWN DISPOSABLE) ×4 IMPLANT
HANDPIECE INTERPULSE COAX TIP (DISPOSABLE) ×2
IMMOBILIZER KNEE 20 (SOFTGOODS) ×2
IMMOBILIZER KNEE 20 THIGH 36 (SOFTGOODS) IMPLANT
KIT BASIN OR (CUSTOM PROCEDURE TRAY) ×2 IMPLANT
MARKER SPHERE PSV REFLC THRD 5 (MARKER) ×6 IMPLANT
NS IRRIG 1000ML POUR BTL (IV SOLUTION) ×2 IMPLANT
PACK TOTAL JOINT (CUSTOM PROCEDURE TRAY) ×2 IMPLANT
PADDING CAST COTTON 6X4 STRL (CAST SUPPLIES) ×3 IMPLANT
PIN SCHANZ 4MM 130MM (PIN) ×8 IMPLANT
POSITIONER SURGICAL ARM (MISCELLANEOUS) ×2 IMPLANT
SET HNDPC FAN SPRY TIP SCT (DISPOSABLE) ×1 IMPLANT
SET PAD KNEE POSITIONER (MISCELLANEOUS) ×2 IMPLANT
SPONGE GAUZE 4X4 12PLY (GAUZE/BANDAGES/DRESSINGS) ×2 IMPLANT
SPONGE LAP 18X18 X RAY DECT (DISPOSABLE) ×2 IMPLANT
STAPLER VISISTAT 35W (STAPLE) ×2 IMPLANT
STRIP CLOSURE SKIN 1/2X4 (GAUZE/BANDAGES/DRESSINGS) IMPLANT
SUCTION FRAZIER 12FR DISP (SUCTIONS) ×2 IMPLANT
SUT ETHILON 3 0 PS 1 (SUTURE) ×2 IMPLANT
SUT VIC AB 0 CT1 27 (SUTURE) ×4
SUT VIC AB 0 CT1 27XBRD ANTBC (SUTURE) ×2 IMPLANT
SUT VIC AB 1 CT1 27 (SUTURE) ×2
SUT VIC AB 1 CT1 27XBRD ANTBC (SUTURE) ×3 IMPLANT
SUT VIC AB 2-0 CT1 27 (SUTURE) ×4
SUT VIC AB 2-0 CT1 TAPERPNT 27 (SUTURE) ×2 IMPLANT
SUT VLOC 180 0 24IN GS25 (SUTURE) ×2 IMPLANT
TOWEL OR 17X26 10 PK STRL BLUE (TOWEL DISPOSABLE) ×4 IMPLANT
TOWEL OR NON WOVEN STRL DISP B (DISPOSABLE) ×2 IMPLANT
TRAY FOLEY CATH 14FRSI W/METER (CATHETERS) ×2 IMPLANT
WATER STERILE IRR 1500ML POUR (IV SOLUTION) ×3 IMPLANT
WRAP KNEE MAXI GEL POST OP (GAUZE/BANDAGES/DRESSINGS) ×2 IMPLANT

## 2012-06-09 NOTE — H&P (Signed)
Meredith Shaw is an 66 y.o. female.   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  Her pain is daily and worse with activities.  Her mobility is limited.  She wishes to proceed with a right total knee replacement given the failure of conservative treatment.  She understands the risks of blood loss, nerve injury, DVT, infection and fracture.  The goals are decreased pain and improved mobility.  Past Medical History  Diagnosis Date  . Hypothyroidism   . Arthritis     OA AND PAIN BOTH KNEES -RIGHT KNEE HURTS WORSE THAN LEFT  . Hypertension   . Diabetes mellitus     ORAL MEDICATION - NO INSULIN  . Kidney stones     NONE AT PRESENT TIME THAT PT IS AWARE OF    Past Surgical History  Procedure Date  . Percutaneous nephrolithotomy   . 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 Abdominal hysterectomy   . Eye surgery     RIGHT CATARACT EXTRACTON WITH LENS IMPLANT  . Cholecystectomy 1972    History reviewed. No pertinent family history. Social History:  reports that she has never smoked. She has never used smokeless tobacco. She reports that she does not drink alcohol or use illicit drugs.  Allergies:  Allergies  Allergen Reactions  . Penicillins Itching and Rash    Medications Prior to Admission  Medication Sig Dispense Refill  . cetirizine (ZYRTEC) 10 MG tablet Take 10 mg by mouth at bedtime.      . Cholecalciferol (VITAMIN D3) 5000 UNITS CAPS Take 5,000 mg by mouth daily.      . fish oil-omega-3 fatty acids 1000 MG capsule Take 1 g by mouth daily.      Marland Kitchen levothyroxine (SYNTHROID, LEVOTHROID) 125 MCG tablet Take 125 mcg by mouth daily before breakfast.      . lisinopril (PRINIVIL,ZESTRIL) 20 MG tablet Take 20 mg by mouth daily.      . metFORMIN (GLUCOPHAGE) 1000 MG tablet Take 1,000 mg by mouth 2 (two) times daily with a meal.      . metoprolol (LOPRESSOR) 100 MG tablet Take 100 mg by mouth 2 (two) times daily.         Results for orders placed during the hospital encounter of 06/09/12 (from the past 48 hour(s))  GLUCOSE, CAPILLARY     Status: Abnormal   Collection Time   06/09/12 12:30 PM      Component Value Range Comment   Glucose-Capillary 133 (*) 70 - 99 mg/dL    Comment 1 Documented in Chart      No results found.  Review of Systems  All other systems reviewed and are negative.    Blood pressure 172/74, pulse 51, temperature 97.5 F (36.4 C), resp. rate 20, SpO2 100.00%. Physical Exam  Constitutional: She is oriented to person, place, and time. She appears well-developed and well-nourished.  HENT:  Head: Normocephalic and atraumatic.  Eyes: EOM are normal. Pupils are equal, round, and reactive to light.  Neck: Normal range of motion. Neck supple.  Cardiovascular: Normal rate and regular rhythm.   Respiratory: Effort normal and breath sounds normal.  GI: Soft. Bowel sounds are normal.  Musculoskeletal:       Right knee: She exhibits decreased range of motion, swelling, abnormal alignment and bony tenderness. tenderness found. Medial joint line and lateral joint line tenderness noted.  Neurological: She is alert and oriented to person,  place, and time.  Skin: Skin is warm and dry.  Psychiatric: She has a normal mood and affect.     Assessment/Plan End-stage arthritis right knee 1) to the OR today for a right total knee replacement  BLACKMAN,CHRISTOPHER Y 06/09/2012, 2:13 PM

## 2012-06-09 NOTE — Preoperative (Signed)
Beta Blockers   Reason not to administer Beta Blockers:took lopressor this am 

## 2012-06-09 NOTE — Anesthesia Procedure Notes (Signed)
Anesthesia Regional Block:  Femoral nerve block  Pre-Anesthetic Checklist: ,, timeout performed, Correct Patient, Correct Site, Correct Laterality, Correct Procedure, Correct Position, site marked, Risks and benefits discussed,  Surgical consent,  Pre-op evaluation,  At surgeon's request and post-op pain management  Laterality: Right  Prep: chloraprep       Needles:  Injection technique: Single-shot  Needle Type: Stimiplex     Needle Length: 15cm 15 cm Needle Gauge: 21 G    Additional Needles:  Procedures: ultrasound guided and nerve stimulator Femoral nerve block  Nerve Stimulator or Paresthesia:  Response: 0.5 mA,   Additional Responses:   Narrative:  Start time: 06/09/2012 2:15 PM End time: 06/09/2012 2:25 PM  Events: blood aspirated  Performed by: Personally  Anesthesiologist: Gaetano Hawthorne MD  Additional Notes: Patient tolerated the procedure well without complications  Femoral nerve block

## 2012-06-09 NOTE — Anesthesia Postprocedure Evaluation (Signed)
  Anesthesia Post-op Note  Patient: Meredith Shaw  Procedure(s) Performed: Procedure(s) (LRB): TOTAL KNEE ARTHROPLASTY (Right)  Patient Location: PACU  Anesthesia Type: GA combined with regional for post-op pain  Level of Consciousness: awake and alert   Airway and Oxygen Therapy: Patient Spontanous Breathing  Post-op Pain: mild  Post-op Assessment: Post-op Vital signs reviewed, Patient's Cardiovascular Status Stable, Respiratory Function Stable, Patent Airway and No signs of Nausea or vomiting  Post-op Vital Signs: stable  Complications: No apparent anesthesia complications

## 2012-06-09 NOTE — Brief Op Note (Signed)
06/09/2012  5:05 PM  PATIENT:  Meredith Shaw  66 y.o. female  PRE-OPERATIVE DIAGNOSIS:  Severe Osteoarthritis/Degenerative Joint Disease Right Knee  POST-OPERATIVE DIAGNOSIS:  Severe Osteoarthritis/Degenerative Joint Disease Right Knee  PROCEDURE:  Procedure(s) (LRB): TOTAL KNEE ARTHROPLASTY (Right)  SURGEON:  Surgeon(s) and Role:    * Kathryne Hitch, MD - Primary  PHYSICIAN ASSISTANT:   ASSISTANTS: Maud Deed, PA-C    ANESTHESIA:   regional and general  EBL:  Total I/O In: 2800 [I.V.:2800] Out: 350 [Urine:250; Blood:100]  BLOOD ADMINISTERED:none  LOCAL MEDICATIONS USED:  NONE  SPECIMEN:  No Specimen  DISPOSITION OF SPECIMEN:  N/A  COUNTS:  YES  TOURNIQUET:   Total Tourniquet Time Documented: Thigh (Right) - 76 minutes  DICTATION: .Other Dictation: Dictation Number [id number]  PLAN OF CARE: Admit to inpatient   PATIENT DISPOSITION:  PACU - hemodynamically stable.   Delay start of Pharmacological VTE agent (>24hrs) due to surgical blood loss or risk of bleeding: no

## 2012-06-09 NOTE — Progress Notes (Signed)
Patients heart rate fluctuating around 60 bpm.  Patient takes 100 mg lopressor BID.  Called Dr. Ophelia Charter to ask if lopressor should be given tonight or held, he advised to give tonight's dose as it was ordered.  Med given, will continue to monitor.

## 2012-06-09 NOTE — Transfer of Care (Signed)
Immediate Anesthesia Transfer of Care Note  Patient: Meredith Shaw  Procedure(s) Performed: Procedure(s) (LRB): TOTAL KNEE ARTHROPLASTY (Right)  Patient Location: PACU  Anesthesia Type: General  Level of Consciousness: sedated  Airway & Oxygen Therapy: Patient Spontanous Breathing and Patient connected to face mask oxygen  Post-op Assessment: Report given to PACU RN and Post -op Vital signs reviewed and stable  Post vital signs: Reviewed and stable  Complications: No apparent anesthesia complications

## 2012-06-09 NOTE — Anesthesia Preprocedure Evaluation (Addendum)
Anesthesia Evaluation  Patient identified by MRN, date of birth, ID band Patient awake    Reviewed: Allergy & Precautions, H&P , NPO status , Patient's Chart, lab work & pertinent test results, reviewed documented beta blocker date and time   Airway Mallampati: II TM Distance: >3 FB Neck ROM: full    Dental  (+) Edentulous Upper and Edentulous Lower   Pulmonary neg pulmonary ROS,  breath sounds clear to auscultation  Pulmonary exam normal       Cardiovascular Exercise Tolerance: Good hypertension, Pt. on medications and Pt. on home beta blockers negative cardio ROS  Rhythm:regular Rate:Normal     Neuro/Psych negative neurological ROS  negative psych ROS   GI/Hepatic negative GI ROS, Neg liver ROS,   Endo/Other  negative endocrine ROSWell Controlled, Type 2, Oral Hypoglycemic AgentsHypothyroidism   Renal/GU negative Renal ROS  negative genitourinary   Musculoskeletal   Abdominal   Peds  Hematology negative hematology ROS (+)   Anesthesia Other Findings   Reproductive/Obstetrics negative OB ROS                          Anesthesia Physical Anesthesia Plan  ASA: III  Anesthesia Plan: General   Post-op Pain Management:    Induction: Intravenous  Airway Management Planned: Oral ETT  Additional Equipment:   Intra-op Plan:   Post-operative Plan: Extubation in OR  Informed Consent: I have reviewed the patients History and Physical, chart, labs and discussed the procedure including the risks, benefits and alternatives for the proposed anesthesia with the patient or authorized representative who has indicated his/her understanding and acceptance.   Dental Advisory Given  Plan Discussed with: CRNA and Surgeon  Anesthesia Plan Comments:        Anesthesia Quick Evaluation

## 2012-06-10 LAB — GLUCOSE, CAPILLARY
Glucose-Capillary: 119 mg/dL — ABNORMAL HIGH (ref 70–99)
Glucose-Capillary: 162 mg/dL — ABNORMAL HIGH (ref 70–99)

## 2012-06-10 LAB — CBC
HCT: 27.5 % — ABNORMAL LOW (ref 36.0–46.0)
MCHC: 35.6 g/dL (ref 30.0–36.0)
Platelets: 205 10*3/uL (ref 150–400)
RDW: 13 % (ref 11.5–15.5)
WBC: 7.6 10*3/uL (ref 4.0–10.5)

## 2012-06-10 LAB — BASIC METABOLIC PANEL
BUN: 15 mg/dL (ref 6–23)
Chloride: 105 mEq/L (ref 96–112)
GFR calc Af Amer: 73 mL/min — ABNORMAL LOW (ref >90)
GFR calc non Af Amer: 63 mL/min — ABNORMAL LOW (ref >90)
Potassium: 4.5 mEq/L (ref 3.5–5.1)
Sodium: 139 mEq/L (ref 135–145)

## 2012-06-10 NOTE — Progress Notes (Signed)
Subjective: 1 Day Post-Op Procedure(s) (LRB): TOTAL KNEE ARTHROPLASTY (Right) Patient reports pain as moderate.  Asymptomatic acute blood loss anemia from surgery.  Objective: Vital signs in last 24 hours: Temp:  [97.4 F (36.3 C)-98.2 F (36.8 C)] 97.8 F (36.6 C) (08/31 0523) Pulse Rate:  [50-61] 50  (08/31 0523) Resp:  [10-20] 12  (08/31 0800) BP: (129-189)/(74-115) 151/86 mmHg (08/31 0523) SpO2:  [94 %-100 %] 100 % (08/31 0800) Weight:  [77.5 kg (170 lb 13.7 oz)] 77.5 kg (170 lb 13.7 oz) (08/30 2035)  Intake/Output from previous day: 08/30 0701 - 08/31 0700 In: 3736.3 [I.V.:3686.3; IV Piggyback:50] Out: 1625 [Urine:1175; Drains:350; Blood:100] Intake/Output this shift:     [facility] 06/10/12 0535  HGB 9.8*    Basename 06/10/12 0535  WBC 7.6  RBC 3.17*  HCT 27.5*  PLT 205    Basename 06/10/12 0535  NA 139  K 4.5  CL 105  CO2 27  BUN 15  CREATININE 0.93  GLUCOSE 138*  CALCIUM 8.6   No results found for this basename: LABPT:2,INR:2 in the last 72 hours  Sensation intact distally Intact pulses distally Dorsiflexion/Plantar flexion intact Incision: dressing C/D/I Compartment soft  Assessment/Plan: 1 Day Post-Op Procedure(s) (LRB): TOTAL KNEE ARTHROPLASTY (Right) Up with therapy  Solon Alban Y 06/10/2012, 9:11 AM

## 2012-06-10 NOTE — Evaluation (Signed)
Occupational Therapy Evaluation Patient Details [name] MRN: [id number] DOB: [birth year] Today's Date: 06/10/2012 Time: 1445-1500 OT Time Calculation (min): 15 min  OT Assessment / Plan / Recommendation Clinical Impression  Pt is recovering from L TKA.  Progressing well post op day 1.  All education completed.  No further OT needs.    OT Assessment  Patient does not need any further OT services    Follow Up Recommendations  No OT follow up    Barriers to Discharge      Equipment Recommendations  None recommended by OT    Recommendations for Other Services    Frequency       Precautions / Restrictions Precautions Precautions: Knee;Fall Required Braces or Orthoses: Knee Immobilizer - Right Knee Immobilizer - Right: Discontinue once straight leg raise with < 10 degree lag Restrictions Weight Bearing Restrictions: No Other Position/Activity Restrictions: WBAT   Pertinent Vitals/Pain     ADL  Eating/Feeding: Performed;Independent Where Assessed - Eating/Feeding: Chair Grooming: Performed;Wash/dry hands Where Assessed - Grooming: Unsupported standing Upper Body Bathing: Simulated;Set up Where Assessed - Upper Body Bathing: Unsupported sitting Lower Body Bathing: Minimal assistance;Simulated Where Assessed - Lower Body Bathing: Supported sit to stand Upper Body Dressing: Simulated;Set up Where Assessed - Upper Body Dressing: Unsupported sitting Lower Body Dressing: Performed;Minimal assistance Where Assessed - Lower Body Dressing: Supported sit to stand Toilet Transfer: Simulated;Minimal assistance Toilet Transfer Method: Sit to Barista: Raised toilet seat with arms (or 3-in-1 over toilet) Toileting - Clothing Manipulation and Hygiene: Performed;Supervision/safety Where Assessed - Toileting Clothing Manipulation and Hygiene: Sit to stand from 3-in-1 or toilet Equipment Used: Knee Immobilizer;Rolling walker;Gait  belt Transfers/Ambulation Related to ADLs: min assist to ambulate to bathroom ADL Comments: Pt has all equipment at home from caring for her mother who is now deceased.  Instructed pt in use and availability of AE for LB ADL.    OT Diagnosis:    OT Problem List:   OT Treatment Interventions:     OT Goals    Visit Information  Last OT Received On: 06/10/12 Assistance Needed: +1    Subjective Data  Subjective: "I have had some trouble with vomiting." Patient Stated Goal: Home with intermittent assist of family.   Prior Functioning  Vision/Perception  Home Living Lives With: Spouse Available Help at Discharge: Family Type of Home: House Home Access: Stairs to enter Secretary/administrator of Steps: 2 Entrance Stairs-Rails: None Home Layout: One level Bathroom Shower/Tub: Walk-in Contractor: Handicapped height Bathroom Accessibility: Yes How Accessible: Accessible via walker Home Adaptive Equipment: Walker - rolling;Wheelchair - manual;Straight cane;Bedside commode/3-in-1;Grab bars around toilet;Grab bars in shower;Reacher Prior Function Level of Independence: Independent Able to Take Stairs?: Yes Driving: Yes 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) Musician: No difficulties Dominant Hand: Right      Cognition  Overall Cognitive Status: Appears within functional limits for tasks assessed/performed Arousal/Alertness: Awake/alert Orientation Level: Appears intact for tasks assessed Behavior During Session: [facility] for tasks performed    Extremity/Trunk Assessment Right Upper Extremity Assessment RUE ROM/Strength/Tone: Within functional levels Left Upper Extremity Assessment LUE ROM/Strength/Tone: Within functional levels Right Lower Extremity Assessment RLE ROM/Strength/Tone: Deficits RLE ROM/Strength/Tone Deficits: able to perform SLR against gravity, however with some lag, therefore kept KI on for first session.  RLE  Sensation: WFL - Light Touch Left Lower Extremity Assessment LLE ROM/Strength/Tone: WFL for tasks assessed LLE Sensation: WFL - Light Touch Trunk Assessment Trunk Assessment: Normal   Mobility  Shoulder Instructions  Bed Mobility Bed Mobility: Not assessed Supine to Sit: 4: Min assist;HOB elevated Sitting - Scoot to Edge of Bed: 4: Min guard Details for Bed Mobility Assistance: Min assist for RLE out of bed with cues for hand placement on bed to self assist.  Transfers Transfers: Sit to Stand;Stand to Sit Sit to Stand: 4: Min assist;With upper extremity assist;From elevated surface;With armrests;From bed;From chair/3-in-1 Stand to Sit: 4: Min assist;With upper extremity assist;With armrests;To chair/3-in-1 Details for Transfer Assistance: Performed x 2 in order to use 3in1 in restroom with cues for hand placement and LE management when sitting/standing.        Exercise     Balance     End of Session OT - End of Session Activity Tolerance: Patient tolerated treatment well Patient left: in chair;with call bell/phone within reach;with family/visitor present  GO     Evern Bio 06/10/2012, 3:48 PM [phone]

## 2012-06-10 NOTE — Progress Notes (Signed)
Physical Therapy Treatment Patient Details [name] MRN: [id number] DOB: 27-Dec-1945 Today's Date: 06/10/2012 Time: 1308-6578 PT Time Calculation (min): 25 min  PT Assessment / Plan / Recommendation Comments on Treatment Session  Pt progressing very well with ambulation and exercises.  Can perform SLR against gravity and has approx 70-80 deg knee flex.     Follow Up Recommendations  Home health PT    Barriers to Discharge None      Equipment Recommendations  None recommended by OT    Recommendations for Other Services OT consult  Frequency 7X/week   Plan Discharge plan remains appropriate    Precautions / Restrictions Precautions Precautions: Knee;Fall Required Braces or Orthoses: Knee Immobilizer - Right Knee Immobilizer - Right: Discontinue once straight leg raise with < 10 degree lag Restrictions Weight Bearing Restrictions: No Other Position/Activity Restrictions: WBAT   Pertinent Vitals/Pain 4/10 with ambulation, ice applied    Mobility  Bed Mobility Bed Mobility: Supine to Sit;Sit to Supine Supine to Sit: 4: Min guard;HOB flat Sitting - Scoot to Edge of Bed: 4: Min guard Sit to Supine: 4: Min guard;HOB flat Details for Bed Mobility Assistance: Min/guard to ensure safety of RLE into and out of bed.  Min cues for hand placement and technique.  Transfers Transfers: Sit to Stand;Stand to Sit Sit to Stand: 4: Min guard;From elevated surface;With upper extremity assist;From bed Stand to Sit: 4: Min guard;With upper extremity assist;To bed Details for Transfer Assistance: Min cues for hand placement and LE management.  Ambulation/Gait Ambulation/Gait Assistance: 4: Min assist Ambulation Distance (Feet): 100 Feet Assistive device: Rolling walker Ambulation/Gait Assistance Details: Min cues for not stepping too far inside of RW and to maintain upright posture.  Gait Pattern: Step-to pattern;Decreased stride length;Trunk flexed Gait velocity:  decreased Stairs: No Wheelchair Mobility Wheelchair Mobility: No    Exercises Total Joint Exercises Ankle Circles/Pumps: AROM;Both;20 reps Quad Sets: AROM;Right;10 reps Short Arc QuadBarbaraann Boys;Right;10 reps Heel Slides: AAROM;Right;10 reps Hip ABduction/ADduction: AROM;Right;10 reps Straight Leg Raises: AROM;Right;10 reps   PT Diagnosis: Difficulty walking;Generalized weakness;Acute pain  PT Problem List: Decreased strength;Decreased range of motion;Decreased activity tolerance;Decreased balance;Decreased mobility;Decreased knowledge of use of DME;Pain PT Treatment Interventions: DME instruction;Gait training;Stair training;Functional mobility training;Therapeutic activities;Balance training;Therapeutic exercise;Patient/family education   PT Goals Acute Rehab PT Goals PT Goal Formulation: With patient Time For Goal Achievement: 06/13/12 Potential to Achieve Goals: Good Pt will go Supine/Side to Sit: with supervision PT Goal: Supine/Side to Sit - Progress: Progressing toward goal Pt will go Sit to Supine/Side: with supervision PT Goal: Sit to Supine/Side - Progress: Progressing toward goal Pt will go Sit to Stand: with supervision PT Goal: Sit to Stand - Progress: Progressing toward goal Pt will Ambulate: 51 - 150 feet;with supervision;with least restrictive assistive device PT Goal: Ambulate - Progress: Progressing toward goal Pt will Go Up / Down Stairs: 1-2 stairs;with supervision;with least restrictive assistive device PT Goal: Up/Down Stairs - Progress: Goal set today Pt will Perform Home Exercise Program: with supervision, verbal cues required/provided PT Goal: Perform Home Exercise Program - Progress: Progressing toward goal  Visit Information  Last PT Received On: 06/10/12 Assistance Needed: +1    Subjective Data  Subjective: I got sick again after lunch Patient Stated Goal: to get moving   Cognition  Overall Cognitive Status: Appears within functional limits for  tasks assessed/performed Arousal/Alertness: Awake/alert Orientation Level: Appears intact for tasks assessed Behavior During Session: [facility] for tasks performed    Balance     End of Session PT -  End of Session Equipment Utilized During Treatment: Right knee immobilizer Activity Tolerance: Patient tolerated treatment well Patient left: in bed;with call bell/phone within reach;with family/visitor present Nurse Communication: Mobility status   GP     Page, Meribeth Mattes 06/10/2012, 5:07 PM

## 2012-06-10 NOTE — Progress Notes (Signed)
Called MD as pt vomited after taking am BP medicines (Lopressor, Lisinopril). Annell Greening, MD ordered medicines re-given after antiemetic.

## 2012-06-10 NOTE — Evaluation (Signed)
Physical Therapy Evaluation Patient Details [name] MRN: [id number] DOB: 1946/08/31 Today's Date: 06/10/2012 Time: 1220-1247 PT Time Calculation (min): 27 min  PT Assessment / Plan / Recommendation Clinical Impression  Pt presents s/p R TKA POD 1 with decreased strength, ROM and mobiltiy.  Tolerated OOB and ambulation in hallway very well with min assist for safety.  Pt will benefit from skilled PT in acute venue to address deficits.  PT recommends HHPT for follow up therapy at D/C to return pt to prior level of functioning.     PT Assessment  Patient needs continued PT services    Follow Up Recommendations  Home health PT    Barriers to Discharge None      Equipment Recommendations  None recommended by PT    Recommendations for Other Services OT consult   Frequency 7X/week    Precautions / Restrictions Precautions Precautions: Knee Required Braces or Orthoses: Knee Immobilizer - Right Knee Immobilizer - Right: Discontinue once straight leg raise with < 10 degree lag Restrictions Weight Bearing Restrictions: No Other Position/Activity Restrictions: WBAT   Pertinent Vitals/Pain 5/10 following ambulation.  Ice packs applied, repositioned.       Mobility  Bed Mobility Bed Mobility: Supine to Sit;Sitting - Scoot to Edge of Bed Supine to Sit: 4: Min assist;HOB elevated Sitting - Scoot to [place] of Bed: 4: Min guard Details for Bed Mobility Assistance: Min assist for RLE out of bed with cues for hand placement on bed to self assist.  Transfers Transfers: Sit to Stand;Stand to Sit Sit to Stand: 4: Min assist;With upper extremity assist;From elevated surface;With armrests;From bed;From chair/3-in-1 Stand to Sit: 4: Min assist;With upper extremity assist;With armrests;To chair/3-in-1 Details for Transfer Assistance: Performed x 2 in order to use 3in1 in restroom with cues for hand placement and LE management when sitting/standing.  Ambulation/Gait Ambulation/Gait  Assistance: 4: Min assist Ambulation Distance (Feet): 90 Feet (another 10' to restroom. ) Assistive device: Rolling walker Ambulation/Gait Assistance Details: Cues for sequencing/technique with RW and to maintain upright posture.  No c/o dizziness or nausea.  Gait Pattern: Step-to pattern;Decreased stride length;Trunk flexed Gait velocity: decreased Stairs: No Wheelchair Mobility Wheelchair Mobility: No    Exercises     PT Diagnosis: Difficulty walking;Generalized weakness;Acute pain  PT Problem List: Decreased strength;Decreased range of motion;Decreased activity tolerance;Decreased balance;Decreased mobility;Decreased knowledge of use of DME;Pain PT Treatment Interventions: DME instruction;Gait training;Stair training;Functional mobility training;Therapeutic activities;Balance training;Therapeutic exercise;Patient/family education   PT Goals Acute Rehab PT Goals PT Goal Formulation: With patient Time For Goal Achievement: 06/13/12 Potential to Achieve Goals: Good Pt will go Supine/Side to Sit: with supervision PT Goal: Supine/Side to Sit - Progress: Goal set today Pt will go Sit to Supine/Side: with supervision PT Goal: Sit to Supine/Side - Progress: Goal set today Pt will go Sit to Stand: with supervision PT Goal: Sit to Stand - Progress: Goal set today Pt will Ambulate: 51 - 150 feet;with supervision;with least restrictive assistive device PT Goal: Ambulate - Progress: Goal set today Pt will Go Up / Down Stairs: 1-2 stairs;with supervision;with least restrictive assistive device PT Goal: Up/Down Stairs - Progress: Goal set today Pt will Perform Home Exercise Program: with supervision, verbal cues required/provided PT Goal: Perform Home Exercise Program - Progress: Goal set today  Visit Information  Last PT Received On: 06/10/12 Assistance Needed: +1    Subjective Data  Subjective: I'm feeling better now Patient Stated Goal: to get moving   Prior Functioning  Home  Living Lives With:  Spouse Available Help at Discharge: Family Type of Home: House Home Access: Stairs to enter [employer] of Steps: 2 Entrance Stairs-Rails: None Home Layout: One level Bathroom Shower/Tub: Walk-in Contractor: Handicapped height Home Adaptive Equipment: Walker - rolling;Wheelchair - manual;Straight cane;Bedside commode/3-in-1;Grab bars around toilet Prior Function Level of Independence: Independent Able to Take Stairs?: Yes Vocation: Retired Musician: No difficulties    Cognition  Overall Cognitive Status: Appears within functional limits for tasks assessed/performed Arousal/Alertness: Awake/alert Orientation Level: Appears intact for tasks assessed Behavior During Session: Meredith Shaw for tasks performed    Extremity/Trunk Assessment Right Lower Extremity Assessment RLE ROM/Strength/Tone: Deficits RLE ROM/Strength/Tone Deficits: able to perform SLR against gravity, however with some lag, therefore kept KI on for first session.  RLE Sensation: WFL - Light Touch Left Lower Extremity Assessment LLE ROM/Strength/Tone: WFL for tasks assessed LLE Sensation: WFL - Light Touch Trunk Assessment Trunk Assessment: Normal   Balance    End of Session PT - End of Session Equipment Utilized During Treatment: Right knee immobilizer Activity Tolerance: Patient tolerated treatment well Patient left: in chair;with call bell/phone within reach;with family/visitor present Nurse Communication: Mobility status  GP     Meredith Shaw 06/10/2012, 1:49 PM

## 2012-06-10 NOTE — Op Note (Signed)
NAME:  Meredith Shaw, Meredith Shaw NO.:  [id number]  MEDICAL RECORD NO.:  [id number]  LOCATION:  1605                         FACILITY:  [facility]  PHYSICIAN:  Vanita Panda. Magnus Ivan, M.D.DATE OF BIRTH:  09-13-1946  DATE OF PROCEDURE:  06/09/2012 DATE OF DISCHARGE:                              OPERATIVE REPORT   PREOPERATIVE DIAGNOSIS:  Severe end-stage arthritis and degenerative joint disease, right knee.  POSTOPERATIVE DIAGNOSIS:  Severe end-stage arthritis and degenerative joint disease, right knee.  PROCEDURE:  Right total knee arthroplasty.  IMPLANTS:  Stryker triathlon knee with size 3 tibial tray, size 3 femur, 9 mm polyethylene insert, 32 mm patellar button.  SURGEON:  Vanita Panda. Magnus Ivan, M.D.  ASSISTANT:  Wende Neighbors, P.A. who was present and assistance needed throughout the entire case.  ANESTHESIA: 1. Right leg femoral nerve block. 2. General.  TOURNIQUET TIME:  73 minutes.  ESTIMATED BLOOD LOSS:  Less than 200 mL.  COMPLICATIONS:  None.  INDICATIONS:  Ms. Crispen is a 66 year old female with end-stage arthritis involving her right knee.  She has become very painful as it does affect her activities of daily living and quality of life.  Her mobility is limited due to the daily pain.  She has failed injections, anti-inflammatories, rest and time.  She wished to proceed with a total knee arthroplasty given her poor function.  The risks and benefits of surgery were explained to her in great detail, and she did wish to proceed with surgery.  PROCEDURE IN DETAIL:  After informed consent was obtained and appropriate right knee was marked, and anesthesia was obtained as a femoral nerve block.  She was then brought into the operating room, placed on the operative table.  General anesthesia was then obtained. Her right leg was prepped and draped with DuraPrep and sterile drapes including sterile stockinette.  A time-out was called.  She  was identified as the correct patient, correct right knee.  I used an Esmarch wrap for the leg, and tourniquet was inflated to 3 mm of pressure.  A midline incision was then directly made over the patella and carried down to the tibial tubercle and proximally and distally.  I then performed a medial parapatellar arthrotomy, and in the knee joint. There was significant osteophytes throughout the knee and completely worn down cartilage throughout.  First I decided to start with the tibia.  I referenced the first ray and alignment of the tibia using the tibial alignment guide.  I was able to place the cutting block, taken to affect varus and valgus as well as a 0 slope and alignment.  I placed my cutting block and then took 9 mm off the high side using the oscillating saw.  Next, I sized for a size 3 femur after placing the my distal femoral cutting block.  I resected 10 mm.  I then trialed a spacer block for 9 mm and had her at full extension.  She did start with a flexion contracture of at least 5 degrees.  After my distal femoral cut, I set my forward 1 guide based on the epicondylar axis.  I sized this for a size 3, right femur.  I then made  my anterior and posterior cuts, followed by chamfer cuts.  I then made my femoral box cut and drilled lugs for size 3 femur.  I changed my attention back to the tibia and chose a size 3 tibial tray.  We pinned this into place, and then made my keel punched for this with all the trial components in place, I put the knee through range of motion.  I was very pleased with the alignment overall.  I then made a cut 10 mm off of the patella and drilled holes for patella, 32 patellar button.  I then removed all trial components. We copiously irrigated the knee with normal saline solution and then cemented the real size 3 tibia followed by the real size 3 femur.  We placed a fix bearing 9 mm polyethylene, followed by the 32 mm patellar button.  Once the  cement had hardened, we let the tourniquet down and hemostasis was obtained with electrocautery.  I placed a medium Hemovac drain in the arthrotomy, and closed the arthrotomy with interrupted #1 Vicryl suture followed by a running 0 V-Loc suture.  Subcutaneous tissue closed with interrupted 2-0 nylon and staples were used to close the skin.  Well-padded sterile dressings were applied.  She was awakened, extubated, and taken to recovery room in stable condition.  All final counts were correct.  There were no complications noted.     Vanita Panda. Magnus Ivan, M.D.     CYB/MEDQ  D:  06/09/2012  T:  06/10/2012  Job:  [id number]

## 2012-06-11 LAB — CBC
HCT: 28 % — ABNORMAL LOW (ref 36.0–46.0)
Hemoglobin: 10.1 g/dL — ABNORMAL LOW (ref 12.0–15.0)
MCH: 31.2 pg (ref 26.0–34.0)
MCHC: 36.1 g/dL — ABNORMAL HIGH (ref 30.0–36.0)

## 2012-06-11 NOTE — Progress Notes (Signed)
Physical Therapy Treatment Patient Details [name] MRN: [id number] DOB: 02-24-1946 Today's Date: 06/11/2012 Time: 1914-7829 PT Time Calculation (min): 24 min  PT Assessment / Plan / Recommendation Comments on Treatment Session  Pt doing very well with stair training this pm.  States she can use an entrance with no steps, however wanted to practice in case of using stairs and to get into shower.     Follow Up Recommendations  Home health PT    Barriers to Discharge        Equipment Recommendations  None recommended by OT    Recommendations for Other Services    Frequency 7X/week   Plan Discharge plan remains appropriate    Precautions / Restrictions Precautions Precautions: Knee;Fall Required Braces or Orthoses: Knee Immobilizer - Right Knee Immobilizer - Right: Other (comment) Restrictions Weight Bearing Restrictions: No Other Position/Activity Restrictions: WBAT   Pertinent Vitals/Pain 5/10    Mobility  Bed Mobility Bed Mobility: Not assessed Transfers Transfers: Sit to Stand;Stand to Sit Sit to Stand: 4: Min guard;With armrests;From chair/3-in-1 Stand to Sit: 4: Min guard;With armrests;To chair/3-in-1 Details for Transfer Assistance: min cues for hand placement.  Ambulation/Gait Ambulation/Gait Assistance: 5: Supervision Ambulation Distance (Feet): 120 Feet Assistive device: Rolling walker Ambulation/Gait Assistance Details: Min cues for posture.  Doing well with sequencing/technique.  Gait Pattern: Step-to pattern;Decreased stride length;Trunk flexed Gait velocity: decreased Stairs: Yes Stairs Assistance: 4: Min assist Stairs Assistance Details (indicate cue type and reason): cues for sequencing/technique with RW.  Also educated daughter on how to assist pt.  Stair Management Technique: No rails;Step to pattern;Backwards;Forwards;With walker Number of Stairs: 2  Wheelchair Mobility Wheelchair Mobility: No    Exercises Total Joint  Exercises Short Arc Quad: AAROM;Right;10 reps Heel Slides: AAROM;Right;10 reps Hip ABduction/ADduction: AROM;Right;10 reps   PT Diagnosis:    PT Problem List:   PT Treatment Interventions:     PT Goals Acute Rehab PT Goals PT Goal Formulation: With patient Time For Goal Achievement: 06/13/12 Potential to Achieve Goals: Good Pt will go Sit to Stand: with supervision PT Goal: Sit to Stand - Progress: Progressing toward goal Pt will Ambulate: 51 - 150 feet;with modified independence;with least restrictive assistive device PT Goal: Ambulate - Progress: Progressing toward goal Pt will Go Up / Down Stairs: 1-2 stairs;with supervision;with least restrictive assistive device PT Goal: Up/Down Stairs - Progress: Progressing toward goal Pt will Perform Home Exercise Program: with supervision, verbal cues required/provided PT Goal: Perform Home Exercise Program - Progress: Progressing toward goal  Visit Information  Last PT Received On: 06/11/12 Assistance Needed: +1    Subjective Data  Subjective: I will practice the stairs.  Patient Stated Goal: to get moving   Cognition  Overall Cognitive Status: Appears within functional limits for tasks assessed/performed Arousal/Alertness: Awake/alert Orientation Level: Appears intact for tasks assessed Behavior During Session: [facility] for tasks performed    Balance     End of Session PT - End of Session Activity Tolerance: Patient tolerated treatment well Patient left: in chair;with call bell/phone within reach Nurse Communication: Mobility status   GP     Page, Meribeth Mattes 06/11/2012, 2:04 PM

## 2012-06-11 NOTE — Progress Notes (Signed)
Physical Therapy Treatment Patient Details [name] MRN: [id number] DOB: 1946-04-20 Today's Date: 06/11/2012 Time: 4098-1191 PT Time Calculation (min): 30 min  PT Assessment / Plan / Recommendation Comments on Treatment Session  Pt continues to do very well with ambulation and exercises.  Somewhat increased c/o pain today.  Will practice stairs in pm for possible D/C today/tomorrow.     Follow Up Recommendations  Home health PT    Barriers to Discharge        Equipment Recommendations  None recommended by OT    Recommendations for Other Services    Frequency 7X/week   Plan Discharge plan remains appropriate    Precautions / Restrictions Precautions Precautions: Knee;Fall Knee Immobilizer - Right: Other (comment) (D/C'd KI, due to pt performs SLR ) Restrictions Weight Bearing Restrictions: No Other Position/Activity Restrictions: WBAT   Pertinent Vitals/Pain 6/10    Mobility  Bed Mobility Bed Mobility: Supine to Sit;Sit to Supine Supine to Sit: 4: Min guard;HOB flat;With rails Sit to Supine: 4: Min assist;HOB flat Details for Bed Mobility Assistance: Performed sup to sit x 2 in order to get to chair with min/guard to get RLE out of bed, somewhat increased assist to get RLE into bed with cues for technique.  Transfers Transfers: Sit to Stand;Stand to Sit Sit to Stand: 4: Min guard;From elevated surface;From bed Stand to Sit: 4: Min guard;With upper extremity assist;With armrests;To chair/3-in-1 Details for Transfer Assistance: cues for hand placement and LE management when sitting/standing.  Ambulation/Gait Ambulation/Gait Assistance: 5: Supervision Ambulation Distance (Feet): 100 Feet Assistive device: Rolling walker Ambulation/Gait Assistance Details: Min cues for posture Gait Pattern: Step-to pattern;Decreased stride length;Trunk flexed Gait velocity: decreased    Exercises Total Joint Exercises Ankle Circles/Pumps: AROM;Both;20 reps Quad Sets:  AROM;Right;10 reps Straight Leg Raises: AROM;Right;10 reps   PT Diagnosis:    PT Problem List:   PT Treatment Interventions:     PT Goals Acute Rehab PT Goals PT Goal Formulation: With patient Time For Goal Achievement: 06/13/12 Potential to Achieve Goals: Good Pt will go Supine/Side to Sit: with supervision PT Goal: Supine/Side to Sit - Progress: Progressing toward goal Pt will go Sit to Supine/Side: with supervision PT Goal: Sit to Supine/Side - Progress: Progressing toward goal Pt will go Sit to Stand: with supervision PT Goal: Sit to Stand - Progress: Progressing toward goal Pt will Ambulate: 51 - 150 feet;with modified independence;with least restrictive assistive device PT Goal: Ambulate - Progress: Updated due to goal met Pt will Perform Home Exercise Program: with supervision, verbal cues required/provided PT Goal: Perform Home Exercise Program - Progress: Progressing toward goal  Visit Information  Last PT Received On: 06/11/12 Assistance Needed: +1    Subjective Data  Subjective: I'm hurting a little more today Patient Stated Goal: to get moving   Cognition  Overall Cognitive Status: Appears within functional limits for tasks assessed/performed Arousal/Alertness: Awake/alert Orientation Level: Appears intact for tasks assessed Behavior During Session: [facility] for tasks performed    Balance     End of Session PT - End of Session Activity Tolerance: Patient tolerated treatment well Patient left: in chair;with call bell/phone within reach Nurse Communication: Mobility status CPM Right Knee CPM Right Knee: Off   GP     Page, Meribeth Mattes 06/11/2012, 9:25 AM

## 2012-06-11 NOTE — Progress Notes (Signed)
06/11/12 In to see patient to discuss PT recommendation for home health PT.  Pt selected [facility], referral called to [facility] with [facility] and requested paperwork faxed. Jim Like RN CCM [facility].

## 2012-06-11 NOTE — Progress Notes (Signed)
Subjective: 2 Days Post-Op Procedure(s) (LRB): TOTAL KNEE ARTHROPLASTY (Right) Patient reports pain as moderate.    Objective: Vital signs in last 24 hours: Temp:  [97.6 F (36.4 C)-98.8 F (37.1 C)] 98.6 F (37 C) (09/01 0615) Pulse Rate:  [58-65] 65  (09/01 0615) Resp:  [16-18] 18  (09/01 0615) BP: (147-184)/(73-96) 181/80 mmHg (09/01 0615) SpO2:  [99 %-100 %] 100 % (09/01 0615)  Intake/Output from previous day: 08/31 0701 - 09/01 0700 In: 1895.3 [P.O.:360; I.V.:1535.3] Out: 2700 [Urine:1950; Emesis/NG output:750] Intake/Output this shift: Total I/O In: -  Out: 300 [Urine:300]   Basename 06/11/12 0448 06/10/12 0535  HGB 10.1* 9.8*    Basename 06/11/12 0448 06/10/12 0535  WBC 7.6 7.6  RBC 3.24* 3.17*  HCT 28.0* 27.5*  PLT 204 205    Basename 06/10/12 0535  NA 139  K 4.5  CL 105  CO2 27  BUN 15  CREATININE 0.93  GLUCOSE 138*  CALCIUM 8.6   No results found for this basename: LABPT:2,INR:2 in the last 72 hours  Neurologically intact  Assessment/Plan: 2 Days Post-Op Procedure(s) (LRB): TOTAL KNEE ARTHROPLASTY (Right) Up with therapy dressing change, d/c   IV.   Jaydan Meidinger C 06/11/2012, 8:06 AM

## 2012-06-12 LAB — CBC
Hemoglobin: 9.6 g/dL — ABNORMAL LOW (ref 12.0–15.0)
MCH: 30.3 pg (ref 26.0–34.0)
MCV: 86.1 fL (ref 78.0–100.0)
RBC: 3.17 MIL/uL — ABNORMAL LOW (ref 3.87–5.11)

## 2012-06-12 LAB — GLUCOSE, CAPILLARY

## 2012-06-12 MED ORDER — ASPIRIN 325 MG PO TBEC: 325.0000 mg | DELAYED_RELEASE_TABLET | Freq: Two times a day (BID) | ORAL | Status: AC

## 2012-06-12 MED ORDER — OXYCODONE-ACETAMINOPHEN 5-325 MG PO TABS: 1.0000 | ORAL_TABLET | ORAL | Status: AC | PRN

## 2012-06-12 MED ORDER — METHOCARBAMOL 500 MG PO TABS: 500.0000 mg | ORAL_TABLET | Freq: Four times a day (QID) | ORAL | Status: AC | PRN

## 2012-06-12 NOTE — Progress Notes (Signed)
Utilization review completed.  

## 2012-06-12 NOTE — Discharge Summary (Signed)
Patient ID: Meredith Shaw MRN: [id number] DOB/AGE: 1946-10-09 66 y.o.  Admit date: 06/09/2012 Discharge date: 06/12/2012  Admission Diagnoses:  Principal Problem:  *Degenerative arthritis of knee   Discharge Diagnoses:  Same  Past Medical History  Diagnosis Date  . Hypothyroidism   . Arthritis     OA AND PAIN BOTH KNEES -RIGHT KNEE HURTS WORSE THAN LEFT  . Hypertension   . Diabetes mellitus     ORAL MEDICATION - NO INSULIN  . Kidney stones     NONE AT PRESENT TIME THAT PT IS AWARE OF    Surgeries: Procedure(s): TOTAL KNEE ARTHROPLASTY on 06/09/2012   Consultants:    Discharged Condition: Improved  Hospital Course: Meredith Shaw is an 66 y.o. female who was admitted 06/09/2012 for operative treatment ofDegenerative arthritis of knee. Patient has severe unremitting pain that affects sleep, daily activities, and work/hobbies. After pre-op clearance the patient was taken to the operating room on 06/09/2012 and underwent  Procedure(s): TOTAL KNEE ARTHROPLASTY.    Patient was given perioperative antibiotics: Anti-infectives     Start     Dose/Rate Route Frequency Ordered Stop   06/09/12 2100   clindamycin (CLEOCIN) IVPB 600 mg        600 mg 100 mL/hr over 30 Minutes Intravenous Every 6 hours 06/09/12 1856 06/10/12 0352   06/09/12 1213   clindamycin (CLEOCIN) IVPB 900 mg        900 mg 100 mL/hr over 30 Minutes Intravenous 60 min pre-op 06/09/12 1213 06/09/12 1500           Patient was given sequential compression devices, early ambulation, and chemoprophylaxis to prevent DVT.  Patient benefited maximally from hospital stay and there were no complications.    Recent vital signs: Patient Vitals for the past 24 hrs:  BP Temp Temp src Pulse Resp SpO2  06/12/12 0530 155/52 mmHg 98.5 F (36.9 C) Oral 71  18  99 %  06/11/12 2150 170/84 mmHg 98.5 F (36.9 C) Oral 71  18  99 %  06/11/12 0846 - - - - 16  100 %     Recent laboratory studies:  Basename 06/12/12 0353  06/11/12 0448 06/10/12 0535  WBC 9.0 7.6 --  HGB 9.6* 10.1* --  HCT 27.3* 28.0* --  PLT 199 204 --  NA -- -- 139  K -- -- 4.5  CL -- -- 105  CO2 -- -- 27  BUN -- -- 15  CREATININE -- -- 0.93  GLUCOSE -- -- 138*  INR -- -- --  CALCIUM -- -- 8.6     Discharge Medications:   Medication List  As of 06/12/2012  8:24 AM   TAKE these medications         aspirin 325 MG EC tablet   Take 1 tablet (325 mg total) by mouth 2 (two) times daily.      cetirizine 10 MG tablet   Commonly known as: ZYRTEC   Take 10 mg by mouth at bedtime.      fish oil-omega-3 fatty acids 1000 MG capsule   Take 1 g by mouth daily.      levothyroxine 125 MCG tablet   Commonly known as: SYNTHROID, LEVOTHROID   Take 125 mcg by mouth daily before breakfast.      lisinopril 20 MG tablet   Commonly known as: PRINIVIL,ZESTRIL   Take 20 mg by mouth daily.      metFORMIN 1000 MG tablet   Commonly known as: GLUCOPHAGE   Take  1,000 mg by mouth 2 (two) times daily with a meal.      methocarbamol 500 MG tablet   Commonly known as: ROBAXIN   Take 1 tablet (500 mg total) by mouth every 6 (six) hours as needed.      metoprolol 100 MG tablet   Commonly known as: LOPRESSOR   Take 100 mg by mouth 2 (two) times daily.      oxyCODONE-acetaminophen 5-325 MG per tablet   Commonly known as: PERCOCET/ROXICET   Take 1-2 tablets by mouth every 4 (four) hours as needed for pain.      Vitamin D3 5000 UNITS Caps   Take 5,000 mg by mouth daily.            Diagnostic Studies: Dg Chest 2 View  06/05/2012  *RADIOLOGY REPORT*  Clinical Data: 66 year old female preoperative study for knee surgery.  Hypertension.  CHEST - 2 VIEW  Comparison: CT abdomen 06/08/2011.  Findings: Lung volumes within normal limits.  Cardiac size and mediastinal contours are within normal limits.  Visualized tracheal air column is within normal limits.  The lungs are clear.  No pneumothorax or effusion. No acute osseous abnormality identified.   IMPRESSION: No acute cardiopulmonary abnormality.   Original Report Authenticated By: Harley Hallmark, M.D.    X-ray Knee Right Port  06/09/2012  *RADIOLOGY REPORT*  Clinical Data: Post, right total knee arthroplasty.  PORTABLE RIGHT KNEE - 1-2 VIEW 06/09/2012 1724 hours:  Comparison: None.  Findings: Right total knee arthroplasty with anatomic alignment. No acute complicating features.  Surgical drain in place.  IMPRESSION: Anatomic alignment post right total knee arthroplasty without acute complicating features.   Original Report Authenticated By: Arnell Sieving, M.D.     Disposition: 01-Home or Self Care  Discharge Orders    Future Orders Please Complete By Expires   Diet - low sodium heart healthy      Call MD / Call 911      Comments:   If you experience chest pain or shortness of breath, CALL 911 and be transported to the hospital emergency room.  If you develope a fever above 101 F, pus (white drainage) or increased drainage or redness at the wound, or calf pain, call your surgeon's office.   Constipation Prevention      Comments:   Drink plenty of fluids.  Prune juice may be helpful.  You may use a stool softener, such as Colace (over the counter) 100 mg twice a day.  Use MiraLax (over the counter) for constipation as needed.   Increase activity slowly as tolerated      Discharge instructions      Comments:   Increase your activities as comfort allows. You can get your actual incision wet starting 06/14/12, then daily dry dressing. Expect swelling; ice as needed.   Discharge patient         Follow-up Information    Follow up with Kathryne Hitch, MD in 2 weeks.   Contact information:   [facility] [address] [place] [address] [phone]           Signed: Kathryne Hitch 06/12/2012, 8:24 AM

## 2012-06-12 NOTE — Progress Notes (Signed)
Pt to d/c home. AVS reviewed. Pt capable of verbalizing medications and follow-up appointments. Remains hemodynamically stable. No signs and symptoms of distress. Educated pt to return to ER in the case of SOB, dizziness, or chest pain.   

## 2012-06-12 NOTE — Progress Notes (Signed)
Physical Therapy Treatment Patient Details Name: JERICA CREEGAN MRN: [id number] DOB: [birth year] Today's Date: 06/12/2012 Time:  -     PT Assessment / Plan / Recommendation Comments on Treatment Session       Follow Up Recommendations  Home health PT    Barriers to Discharge        Equipment Recommendations  None recommended by OT    Recommendations for Other Services OT consult  Frequency 7X/week   Plan Discharge plan remains appropriate    Precautions / Restrictions Precautions Precautions: Knee;Fall Restrictions Weight Bearing Restrictions: No Other Position/Activity Restrictions: WBAT   Pertinent Vitals/Pain 4/10; premedicated, ice pack provided    Mobility  Bed Mobility Bed Mobility: Not assessed Transfers Transfers: Sit to Stand;Stand to Sit Sit to Stand: 5: Supervision Stand to Sit: 5: Supervision Details for Transfer Assistance: min cues for hand placement.  Ambulation/Gait Ambulation/Gait Assistance: 5: Supervision Assistive device: Rolling walker Gait Pattern: Step-to pattern Gait velocity: decreased Stairs: No    Exercises Total Joint Exercises Ankle Circles/Pumps: AROM;Both;20 reps Quad Sets: AROM;20 reps;Supine;Both Heel Slides: AAROM;20 reps;Right;Supine Straight Leg Raises: AAROM;AROM;20 reps;Supine;Right Long Arc Quad: AAROM;AROM;10 reps;Supine;Right   PT Diagnosis:    PT Problem List:   PT Treatment Interventions:     PT Goals Acute Rehab PT Goals PT Goal Formulation: With patient Time For Goal Achievement: 06/13/12 Potential to Achieve Goals: Good Pt will go Supine/Side to Sit: with supervision Pt will go Sit to Supine/Side: with supervision Pt will go Sit to Stand: with supervision Pt will Ambulate: 51 - 150 feet;with modified independence;with least restrictive assistive device Pt will Go Up / Down Stairs: 1-2 stairs;with supervision;with least restrictive assistive device Pt will Perform Home Exercise Program: with supervision,  verbal cues required/provided  Visit Information  Last PT Received On: 06/12/12 Assistance Needed: +1    Subjective Data  Subjective: I'm going home today Patient Stated Goal: to get moving   Cognition  Overall Cognitive Status: Appears within functional limits for tasks assessed/performed Arousal/Alertness: Awake/alert Orientation Level: Appears intact for tasks assessed Behavior During Session: [facility] for tasks performed    Balance     End of Session PT - End of Session Activity Tolerance: Patient tolerated treatment well Patient left: in chair;with call bell/phone within reach Nurse Communication: Mobility status   GP     Timtohy Broski 06/12/2012, 1:06 PM

## 2012-06-12 NOTE — Progress Notes (Signed)
Patient ID: Meredith Shaw, female   DOB: 08-22-1946, 66 y.o.   MRN: [id number] Looks great.  Will discharge to home today.

## 2012-06-15 ENCOUNTER — Encounter (HOSPITAL_COMMUNITY): Payer: Self-pay | Admitting: Orthopaedic Surgery

## 2012-07-17 ENCOUNTER — Ambulatory Visit: Payer: BC Managed Care – PPO | Attending: Orthopaedic Surgery | Admitting: Physical Therapy

## 2012-07-17 DIAGNOSIS — IMO0001 Reserved for inherently not codable concepts without codable children: Secondary | ICD-10-CM | POA: Insufficient documentation

## 2012-07-17 DIAGNOSIS — M25569 Pain in unspecified knee: Secondary | ICD-10-CM | POA: Diagnosis not present

## 2012-07-17 DIAGNOSIS — M25669 Stiffness of unspecified knee, not elsewhere classified: Secondary | ICD-10-CM | POA: Insufficient documentation

## 2012-07-17 DIAGNOSIS — R5381 Other malaise: Secondary | ICD-10-CM | POA: Insufficient documentation

## 2012-07-17 DIAGNOSIS — Z96659 Presence of unspecified artificial knee joint: Secondary | ICD-10-CM | POA: Diagnosis not present

## 2012-07-19 ENCOUNTER — Ambulatory Visit: Payer: BC Managed Care – PPO | Admitting: *Deleted

## 2012-07-19 DIAGNOSIS — IMO0001 Reserved for inherently not codable concepts without codable children: Secondary | ICD-10-CM | POA: Diagnosis not present

## 2012-07-24 ENCOUNTER — Ambulatory Visit: Payer: BC Managed Care – PPO | Admitting: *Deleted

## 2012-07-24 DIAGNOSIS — IMO0001 Reserved for inherently not codable concepts without codable children: Secondary | ICD-10-CM | POA: Diagnosis not present

## 2012-07-26 ENCOUNTER — Encounter: Payer: BC Managed Care – PPO | Admitting: *Deleted

## 2012-07-28 ENCOUNTER — Ambulatory Visit: Payer: BC Managed Care – PPO | Admitting: *Deleted

## 2012-07-28 DIAGNOSIS — IMO0001 Reserved for inherently not codable concepts without codable children: Secondary | ICD-10-CM | POA: Diagnosis not present

## 2012-07-31 ENCOUNTER — Ambulatory Visit: Payer: BC Managed Care – PPO | Admitting: *Deleted

## 2012-07-31 DIAGNOSIS — IMO0001 Reserved for inherently not codable concepts without codable children: Secondary | ICD-10-CM | POA: Diagnosis not present

## 2012-08-03 ENCOUNTER — Ambulatory Visit: Payer: BC Managed Care – PPO | Admitting: Physical Therapy

## 2012-08-03 DIAGNOSIS — IMO0001 Reserved for inherently not codable concepts without codable children: Secondary | ICD-10-CM | POA: Diagnosis not present

## 2012-08-08 ENCOUNTER — Ambulatory Visit: Payer: BC Managed Care – PPO | Admitting: Physical Therapy

## 2012-08-08 DIAGNOSIS — IMO0001 Reserved for inherently not codable concepts without codable children: Secondary | ICD-10-CM | POA: Diagnosis not present

## 2012-08-10 ENCOUNTER — Ambulatory Visit: Payer: BC Managed Care – PPO | Admitting: Physical Therapy

## 2012-08-10 DIAGNOSIS — IMO0001 Reserved for inherently not codable concepts without codable children: Secondary | ICD-10-CM | POA: Diagnosis not present

## 2012-08-14 ENCOUNTER — Ambulatory Visit: Payer: BC Managed Care – PPO | Attending: Orthopaedic Surgery | Admitting: Physical Therapy

## 2012-08-14 DIAGNOSIS — M25569 Pain in unspecified knee: Secondary | ICD-10-CM | POA: Insufficient documentation

## 2012-08-14 DIAGNOSIS — IMO0001 Reserved for inherently not codable concepts without codable children: Secondary | ICD-10-CM | POA: Insufficient documentation

## 2012-08-14 DIAGNOSIS — Z96659 Presence of unspecified artificial knee joint: Secondary | ICD-10-CM | POA: Insufficient documentation

## 2012-08-14 DIAGNOSIS — R5381 Other malaise: Secondary | ICD-10-CM | POA: Insufficient documentation

## 2012-08-14 DIAGNOSIS — M25669 Stiffness of unspecified knee, not elsewhere classified: Secondary | ICD-10-CM | POA: Insufficient documentation

## 2012-08-16 ENCOUNTER — Ambulatory Visit: Payer: BC Managed Care – PPO | Admitting: Physical Therapy

## 2012-08-21 ENCOUNTER — Ambulatory Visit: Payer: BC Managed Care – PPO | Admitting: Physical Therapy

## 2012-08-22 DIAGNOSIS — R5381 Other malaise: Secondary | ICD-10-CM | POA: Diagnosis not present

## 2012-08-22 DIAGNOSIS — E785 Hyperlipidemia, unspecified: Secondary | ICD-10-CM | POA: Diagnosis not present

## 2012-08-22 DIAGNOSIS — I10 Essential (primary) hypertension: Secondary | ICD-10-CM | POA: Diagnosis not present

## 2012-08-22 DIAGNOSIS — R5383 Other fatigue: Secondary | ICD-10-CM | POA: Diagnosis not present

## 2012-08-22 DIAGNOSIS — E119 Type 2 diabetes mellitus without complications: Secondary | ICD-10-CM | POA: Diagnosis not present

## 2012-08-24 ENCOUNTER — Ambulatory Visit: Payer: BC Managed Care – PPO | Admitting: Physical Therapy

## 2012-12-26 ENCOUNTER — Other Ambulatory Visit: Payer: Self-pay | Admitting: Nurse Practitioner

## 2012-12-26 ENCOUNTER — Other Ambulatory Visit: Payer: Self-pay | Admitting: *Deleted

## 2012-12-26 DIAGNOSIS — E039 Hypothyroidism, unspecified: Secondary | ICD-10-CM

## 2012-12-26 MED ORDER — LEVOTHYROXINE SODIUM 137 MCG PO TABS: 137.0000 ug | ORAL_TABLET | Freq: Every day | ORAL | Status: DC

## 2012-12-26 NOTE — Telephone Encounter (Signed)
Opened in error

## 2012-12-26 NOTE — Telephone Encounter (Signed)
Patient needs to be seen to recheck blood work!

## 2013-01-24 ENCOUNTER — Other Ambulatory Visit: Payer: Self-pay | Admitting: *Deleted

## 2013-01-24 NOTE — Telephone Encounter (Signed)
Last A1C 11/13 

## 2013-01-26 MED ORDER — METFORMIN HCL 1000 MG PO TABS: 1000.0000 mg | ORAL_TABLET | Freq: Two times a day (BID) | ORAL | Status: DC

## 2013-02-12 DIAGNOSIS — M25569 Pain in unspecified knee: Secondary | ICD-10-CM | POA: Diagnosis not present

## 2013-02-12 DIAGNOSIS — M1710 Unilateral primary osteoarthritis, unspecified knee: Secondary | ICD-10-CM | POA: Diagnosis not present

## 2013-02-13 ENCOUNTER — Other Ambulatory Visit: Payer: Self-pay

## 2013-02-13 MED ORDER — LISINOPRIL 20 MG PO TABS: 20.0000 mg | ORAL_TABLET | Freq: Every day | ORAL | Status: DC

## 2013-02-26 ENCOUNTER — Other Ambulatory Visit: Payer: Self-pay

## 2013-02-26 MED ORDER — METFORMIN HCL 1000 MG PO TABS: 1000.0000 mg | ORAL_TABLET | Freq: Two times a day (BID) | ORAL | Status: DC

## 2013-03-06 ENCOUNTER — Other Ambulatory Visit: Payer: Self-pay | Admitting: *Deleted

## 2013-03-06 MED ORDER — CHLORTHALIDONE 25 MG PO TABS: 25.0000 mg | ORAL_TABLET | Freq: Every day | ORAL | Status: DC

## 2013-03-30 ENCOUNTER — Other Ambulatory Visit: Payer: Self-pay

## 2013-03-30 MED ORDER — METOPROLOL TARTRATE 100 MG PO TABS: 100.0000 mg | ORAL_TABLET | Freq: Two times a day (BID) | ORAL | Status: DC

## 2013-04-26 ENCOUNTER — Other Ambulatory Visit: Payer: Self-pay

## 2013-04-26 NOTE — Telephone Encounter (Signed)
Last seen 08/13/13  FPW 

## 2013-04-27 MED ORDER — LISINOPRIL 20 MG PO TABS: 20.0000 mg | ORAL_TABLET | Freq: Every day | ORAL | Status: DC

## 2013-05-04 ENCOUNTER — Other Ambulatory Visit: Payer: Self-pay | Admitting: *Deleted

## 2013-05-04 NOTE — Telephone Encounter (Signed)
Patient has been notified at last refill that NTBS before refilled again. Please advise.

## 2013-05-21 ENCOUNTER — Other Ambulatory Visit: Payer: Self-pay | Admitting: *Deleted

## 2013-05-21 DIAGNOSIS — E039 Hypothyroidism, unspecified: Secondary | ICD-10-CM

## 2013-05-21 MED ORDER — LEVOTHYROXINE SODIUM 137 MCG PO TABS: 137.0000 ug | ORAL_TABLET | Freq: Every day | ORAL | Status: DC

## 2013-05-21 MED ORDER — LISINOPRIL 20 MG PO TABS: 20.0000 mg | ORAL_TABLET | Freq: Every day | ORAL | Status: DC

## 2013-05-21 NOTE — Telephone Encounter (Signed)
LAST THYROID 11/13. LAST OV 1/14.

## 2013-06-04 ENCOUNTER — Other Ambulatory Visit: Payer: Self-pay | Admitting: *Deleted

## 2013-06-04 MED ORDER — METFORMIN HCL 1000 MG PO TABS: 1000.0000 mg | ORAL_TABLET | Freq: Two times a day (BID) | ORAL | Status: DC

## 2013-06-04 NOTE — Telephone Encounter (Signed)
Patient was notified at last refill that NTBS. Please advise 

## 2013-06-25 ENCOUNTER — Other Ambulatory Visit: Payer: Self-pay

## 2013-06-25 DIAGNOSIS — E039 Hypothyroidism, unspecified: Secondary | ICD-10-CM

## 2013-06-25 NOTE — Telephone Encounter (Signed)
Patient needs to be seen. Has exceeded time since last visit. Needs to bring all medications to next appointment.   

## 2013-06-25 NOTE — Telephone Encounter (Signed)
Last seen 10/23/12  FP[facility]   Last thyroid labs 09/01/12

## 2013-06-26 NOTE — Telephone Encounter (Signed)
Patient aware you need to see a pcp

## 2013-06-28 IMAGING — CR DG KNEE 1-2V PORT*R*
1 series · 2 of 2 positions shown · non-contrast
Comparison: None.

CLINICAL DATA: Post, right total knee arthroplasty.

PORTABLE RIGHT KNEE - 1-2 VIEW [DATE]/3917 1432 hours:

[Series 1: AP · right · 2 of 2 slices shown]
[im 1/2]
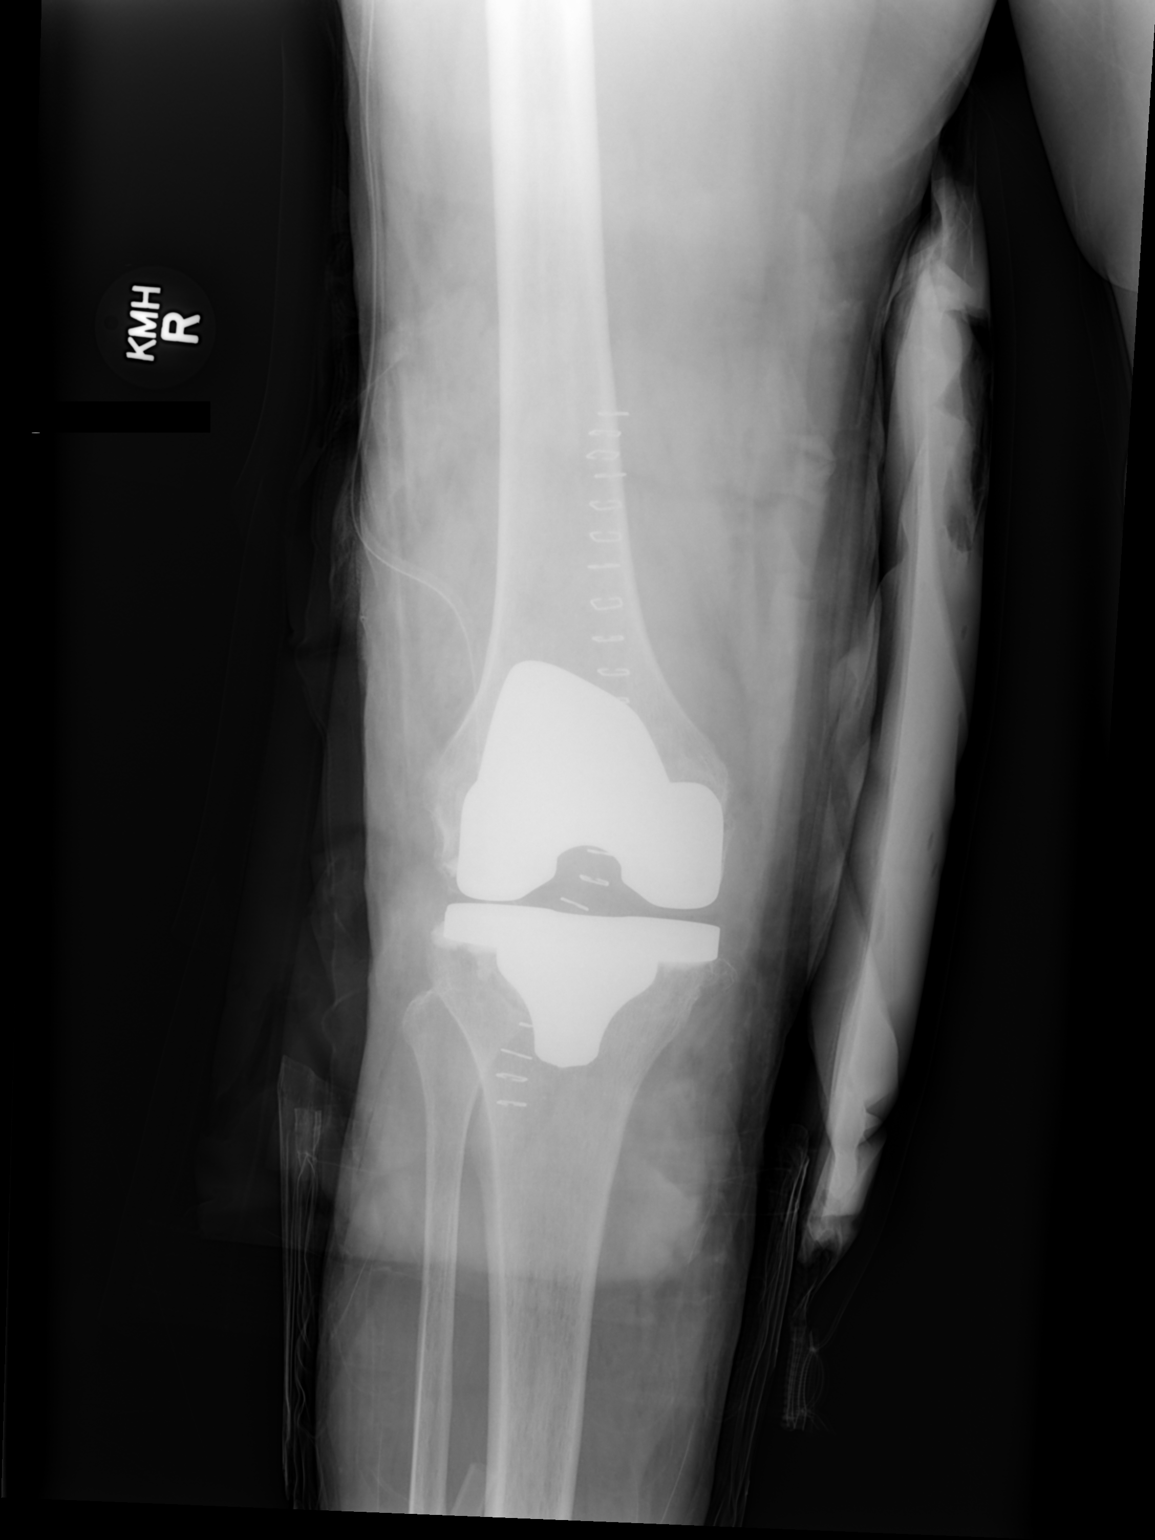
[im 2/2]
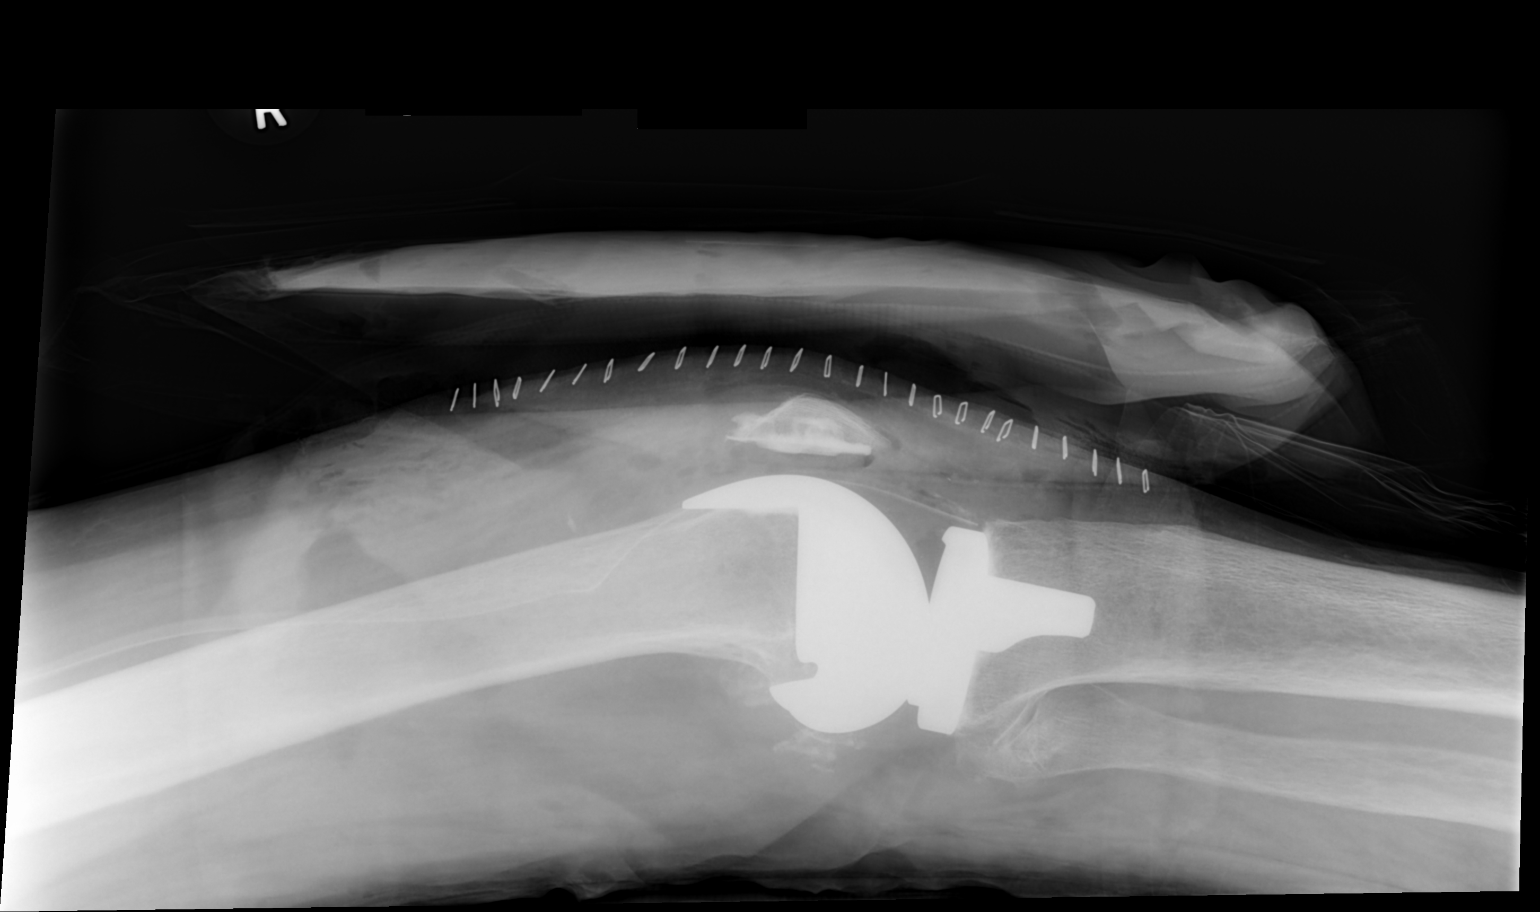

[2 of 2 positions shown; findings below may reference images not displayed]

FINDINGS: Right total knee arthroplasty with anatomic alignment.
No acute complicating features.  Surgical drain in place.
IMPRESSION: Anatomic alignment post right total knee arthroplasty without acute
complicating features.

## 2013-07-03 ENCOUNTER — Encounter: Payer: Self-pay | Admitting: Family Medicine

## 2013-07-03 ENCOUNTER — Ambulatory Visit (INDEPENDENT_AMBULATORY_CARE_PROVIDER_SITE_OTHER): Payer: BC Managed Care – PPO | Admitting: Family Medicine

## 2013-07-03 VITALS — BP 141/86 | HR 76 | Temp 97.3°F | Wt 176.8 lb

## 2013-07-03 DIAGNOSIS — E890 Postprocedural hypothyroidism: Secondary | ICD-10-CM | POA: Insufficient documentation

## 2013-07-03 DIAGNOSIS — E119 Type 2 diabetes mellitus without complications: Secondary | ICD-10-CM | POA: Insufficient documentation

## 2013-07-03 DIAGNOSIS — IMO0002 Reserved for concepts with insufficient information to code with codable children: Secondary | ICD-10-CM | POA: Diagnosis not present

## 2013-07-03 DIAGNOSIS — M129 Arthropathy, unspecified: Secondary | ICD-10-CM | POA: Insufficient documentation

## 2013-07-03 DIAGNOSIS — M1710 Unilateral primary osteoarthritis, unspecified knee: Secondary | ICD-10-CM | POA: Diagnosis not present

## 2013-07-03 DIAGNOSIS — I10 Essential (primary) hypertension: Secondary | ICD-10-CM

## 2013-07-03 DIAGNOSIS — E559 Vitamin D deficiency, unspecified: Secondary | ICD-10-CM | POA: Diagnosis not present

## 2013-07-03 DIAGNOSIS — M1990 Unspecified osteoarthritis, unspecified site: Secondary | ICD-10-CM

## 2013-07-03 DIAGNOSIS — E039 Hypothyroidism, unspecified: Secondary | ICD-10-CM | POA: Diagnosis not present

## 2013-07-03 DIAGNOSIS — M179 Osteoarthritis of knee, unspecified: Secondary | ICD-10-CM

## 2013-07-03 DIAGNOSIS — J309 Allergic rhinitis, unspecified: Secondary | ICD-10-CM

## 2013-07-03 DIAGNOSIS — E785 Hyperlipidemia, unspecified: Secondary | ICD-10-CM | POA: Diagnosis not present

## 2013-07-03 DIAGNOSIS — J302 Other seasonal allergic rhinitis: Secondary | ICD-10-CM

## 2013-07-03 DIAGNOSIS — I152 Hypertension secondary to endocrine disorders: Secondary | ICD-10-CM | POA: Insufficient documentation

## 2013-07-03 DIAGNOSIS — N200 Calculus of kidney: Secondary | ICD-10-CM | POA: Insufficient documentation

## 2013-07-03 DIAGNOSIS — E1159 Type 2 diabetes mellitus with other circulatory complications: Secondary | ICD-10-CM | POA: Insufficient documentation

## 2013-07-03 LAB — POCT GLYCOSYLATED HEMOGLOBIN (HGB A1C): Hemoglobin A1C: 8.7

## 2013-07-03 MED ORDER — CETIRIZINE HCL 10 MG PO TABS: 10.0000 mg | ORAL_TABLET | Freq: Every day | ORAL | Status: DC

## 2013-07-03 MED ORDER — LEVOTHYROXINE SODIUM 137 MCG PO TABS: 137.0000 ug | ORAL_TABLET | Freq: Every day | ORAL | Status: DC

## 2013-07-03 MED ORDER — METOPROLOL TARTRATE 100 MG PO TABS: 100.0000 mg | ORAL_TABLET | Freq: Two times a day (BID) | ORAL | Status: DC

## 2013-07-03 MED ORDER — METFORMIN HCL 1000 MG PO TABS: 1000.0000 mg | ORAL_TABLET | Freq: Two times a day (BID) | ORAL | Status: DC

## 2013-07-03 MED ORDER — LISINOPRIL 20 MG PO TABS: 20.0000 mg | ORAL_TABLET | Freq: Every day | ORAL | Status: DC

## 2013-07-03 MED ORDER — CHLORTHALIDONE 25 MG PO TABS: 25.0000 mg | ORAL_TABLET | Freq: Every day | ORAL | Status: DC

## 2013-07-03 NOTE — Progress Notes (Signed)
Patient ID: Meredith Shaw, female   DOB: 15-Jun-1946, 67 y.o.   MRN: [id number] SUBJECTIVE: CC: Chief Complaint  Patient presents with  . Follow-up    follow up chronic problems   . Medication Refill    needs all refill     HPI: Patient is here for follow up of Diabetes Mellitus/hypertension/hypothyroid: Symptoms evaluated: Denies Nocturia ,Denies Urinary Frequency , denies Blurred vision ,deniesDizziness,denies.Dysuria,denies paresthesias, denies extremity pain or ulcers.Marland Kitchendenies chest pain. has had an annual eye exam. do check the feet. Does check CBGs. Average CBG:120 plus Denies episodes of hypoglycemia. Does have an emergency hypoglycemic plan. admits toCompliance with medications. Denies Problems with medications.  Doing well.  Past Medical History  Diagnosis Date  . Arthritis     OA AND PAIN BOTH KNEES -RIGHT KNEE HURTS WORSE THAN LEFT  . Diabetes mellitus     ORAL MEDICATION - NO INSULIN  . Hypertension   . Kidney stones     NONE AT PRESENT TIME THAT PT IS AWARE OF  . Hypothyroidism    Past Surgical History  Procedure Laterality Date  . Percutaneous nephrolithotomy    . Growth removed from thyroid    . Abdominal hysterectomy    . Eye surgery      RIGHT CATARACT EXTRACTON WITH LENS IMPLANT  . Cholecystectomy  1972  . Total knee arthroplasty  06/09/2012    Procedure: TOTAL KNEE ARTHROPLASTY;  Surgeon: Kathryne Hitch, MD;  Location: [facility];  Service: Orthopedics;  Laterality: Right;  Right Total Knee Arthroplasty   History   Social History  . Marital Status: Married    Spouse Name: N/A    Number of Children: N/A  . Years of Education: N/A   Occupational History  . Not on file.   Social History Main Topics  . Smoking status: Never Smoker   . Smokeless tobacco: Never Used  . Alcohol Use: No  . Drug Use: No  . Sexual Activity: Not on file   Other Topics Concern  . Not on file   Social History Narrative  . No narrative on file   Family  History  Problem Relation Age of Onset  . Stroke Father   . Heart disease Father    Current Outpatient Prescriptions on File Prior to Visit  Medication Sig Dispense Refill  . cetirizine (ZYRTEC) 10 MG tablet Take 10 mg by mouth at bedtime.      . chlorthalidone (HYGROTON) 25 MG tablet Take 1 tablet (25 mg total) by mouth daily.  30 tablet  0  . Cholecalciferol (VITAMIN D3) 5000 UNITS CAPS Take 5,000 mg by mouth daily.      . fish oil-omega-3 fatty acids 1000 MG capsule Take 1 g by mouth daily.      Marland Kitchen levothyroxine (SYNTHROID, LEVOTHROID) 137 MCG tablet Take 1 tablet (137 mcg total) by mouth daily.  30 tablet  0  . lisinopril (PRINIVIL,ZESTRIL) 20 MG tablet Take 1 tablet (20 mg total) by mouth daily.  30 tablet  0  . metFORMIN (GLUCOPHAGE) 1000 MG tablet Take 1 tablet (1,000 mg total) by mouth 2 (two) times daily with a meal.  60 tablet  0  . metoprolol (LOPRESSOR) 100 MG tablet Take 1 tablet (100 mg total) by mouth 2 (two) times daily.  60 tablet  0   No current facility-administered medications on file prior to visit.   Allergies  Allergen Reactions  . Penicillins Itching and Rash    There is no immunization history on file  for this patient. Prior to Admission medications   Medication Sig Start Date End Date Taking? Authorizing Provider  cetirizine (ZYRTEC) 10 MG tablet Take 10 mg by mouth at bedtime.   Yes Historical Provider, MD  chlorthalidone (HYGROTON) 25 MG tablet Take 1 tablet (25 mg total) by mouth daily. 03/06/13  Yes Ileana Ladd, MD  Cholecalciferol (VITAMIN D3) 5000 UNITS CAPS Take 5,000 mg by mouth daily.   Yes Historical Provider, MD  fish oil-omega-3 fatty acids 1000 MG capsule Take 1 g by mouth daily.   Yes Historical Provider, MD  levothyroxine (SYNTHROID, LEVOTHROID) 137 MCG tablet Take 1 tablet (137 mcg total) by mouth daily. 05/21/13  Yes Ileana Ladd, MD  lisinopril (PRINIVIL,ZESTRIL) 20 MG tablet Take 1 tablet (20 mg total) by mouth daily. 05/21/13  Yes Ileana Ladd, MD  metFORMIN (GLUCOPHAGE) 1000 MG tablet Take 1 tablet (1,000 mg total) by mouth 2 (two) times daily with a meal. 06/04/13  Yes Mary-Margaret Daphine Deutscher, FNP  metoprolol (LOPRESSOR) 100 MG tablet Take 1 tablet (100 mg total) by mouth 2 (two) times daily. 03/30/13  Yes Ileana Ladd, MD    ROS: As above in the HPI. All other systems are stable or negative.  OBJECTIVE: APPEARANCE:  Patient in no acute distress.The patient appeared well nourished and normally developed. Acyanotic. Waist: VITAL SIGNS:BP 141/86  Pulse 76  Temp(Src) 97.3 F (36.3 C) (Oral)  Wt 176 lb 12.8 oz (80.196 kg)  BMI 27.68 kg/m2  WF SKIN: warm and  Dry without overt rashes, tattoos and scars  HEAD and Neck: without JVD, Head and scalp: normal Eyes:No scleral icterus. Fundi normal, eye movements normal. Ears: Auricle normal, canal normal, Tympanic membranes normal, insufflation normal. Nose: normal Throat: normal Neck & thyroid: normal  CHEST & LUNGS: Chest wall: normal Lungs: Clear  CVS: Reveals the PMI to be normally located. Regular rhythm, First and Second Heart sounds are normal,  absence of murmurs, rubs or gallops. Peripheral vasculature: Radial pulses: normal Dorsal pedis pulses: normal Posterior pulses: normal  ABDOMEN:  Appearance: normal Benign, no organomegaly, no masses, no Abdominal Aortic enlargement. No Guarding , no rebound. No Bruits. Bowel sounds: normal  RECTAL: N/A GU: N/A  EXTREMETIES: nonedematous.  MUSCULOSKELETAL:  Spine: normal Joints: intact  NEUROLOGIC: oriented to time,place and person; nonfocal. Strength is normal Sensory is normal Reflexes are normal Cranial Nerves are normal. Results for orders placed during the hospital encounter of 06/09/12  GLUCOSE, CAPILLARY      Result Value Range   Glucose-Capillary 133 (*) 70 - 99 mg/dL   Comment 1 Documented in Chart    GLUCOSE, CAPILLARY      Result Value Range   Glucose-Capillary 149 (*) 70 - 99  mg/dL   Comment 1 Documented in Chart     Comment 2 Notify RN    GLUCOSE, CAPILLARY      Result Value Range   Glucose-Capillary 150 (*) 70 - 99 mg/dL   Comment 1 Notify RN     Comment 2 Documented in Chart    CBC      Result Value Range   WBC 7.6  4.0 - 10.5 K/uL   RBC 3.17 (*) 3.87 - 5.11 MIL/uL   Hemoglobin 9.8 (*) 12.0 - 15.0 g/dL   HCT 40.9 (*) 81.1 - 91.4 %   MCV 86.8  78.0 - 100.0 fL   MCH 30.9  26.0 - 34.0 pg   MCHC 35.6  30.0 - 36.0 g/dL   RDW  13.0  11.5 - 15.5 %   Platelets 205  150 - 400 K/uL  BASIC METABOLIC PANEL      Result Value Range   Sodium 139  135 - 145 mEq/L   Potassium 4.5  3.5 - 5.1 mEq/L   Chloride 105  96 - 112 mEq/L   CO2 27  19 - 32 mEq/L   Glucose, Bld 138 (*) 70 - 99 mg/dL   BUN 15  6 - 23 mg/dL   Creatinine, Ser 1.61  0.50 - 1.10 mg/dL   Calcium 8.6  8.4 - 09.6 mg/dL   GFR calc non Af Amer 63 (*) >90 mL/min   GFR calc Af Amer 73 (*) >90 mL/min  GLUCOSE, CAPILLARY      Result Value Range   Glucose-Capillary 167 (*) 70 - 99 mg/dL  GLUCOSE, CAPILLARY      Result Value Range   Glucose-Capillary 119 (*) 70 - 99 mg/dL  GLUCOSE, CAPILLARY      Result Value Range   Glucose-Capillary 162 (*) 70 - 99 mg/dL  CBC      Result Value Range   WBC 7.6  4.0 - 10.5 K/uL   RBC 3.24 (*) 3.87 - 5.11 MIL/uL   Hemoglobin 10.1 (*) 12.0 - 15.0 g/dL   HCT 04.5 (*) 40.9 - 81.1 %   MCV 86.4  78.0 - 100.0 fL   MCH 31.2  26.0 - 34.0 pg   MCHC 36.1 (*) 30.0 - 36.0 g/dL   RDW 91.4  78.2 - 95.6 %   Platelets 204  150 - 400 K/uL  GLUCOSE, CAPILLARY      Result Value Range   Glucose-Capillary 136 (*) 70 - 99 mg/dL  GLUCOSE, CAPILLARY      Result Value Range   Glucose-Capillary 152 (*) 70 - 99 mg/dL  GLUCOSE, CAPILLARY      Result Value Range   Glucose-Capillary 171 (*) 70 - 99 mg/dL  CBC      Result Value Range   WBC 9.0  4.0 - 10.5 K/uL   RBC 3.17 (*) 3.87 - 5.11 MIL/uL   Hemoglobin 9.6 (*) 12.0 - 15.0 g/dL   HCT 21.3 (*) 08.6 - 57.8 %   MCV 86.1  78.0 -  100.0 fL   MCH 30.3  26.0 - 34.0 pg   MCHC 35.2  30.0 - 36.0 g/dL   RDW 46.9  62.9 - 52.8 %   Platelets 199  150 - 400 K/uL  GLUCOSE, CAPILLARY      Result Value Range   Glucose-Capillary 157 (*) 70 - 99 mg/dL  GLUCOSE, CAPILLARY      Result Value Range   Glucose-Capillary 151 (*) 70 - 99 mg/dL   Comment 1 Notify RN     Comment 2 Documented in Chart      ASSESSMENT: Unspecified hypothyroidism - Plan: levothyroxine (SYNTHROID, LEVOTHROID) 137 MCG tablet, TSH  Arthritis  Degenerative arthritis of knee  Type II or unspecified type diabetes mellitus without mention of complication, not stated as uncontrolled - Plan: metFORMIN (GLUCOPHAGE) 1000 MG tablet, POCT glycosylated hemoglobin (Hb A1C), POCT UA - Microalbumin, CMP14+EGFR  Kidney stones  Hypothyroidism  Hypertension - Plan: lisinopril (PRINIVIL,ZESTRIL) 20 MG tablet, metoprolol (LOPRESSOR) 100 MG tablet, chlorthalidone (HYGROTON) 25 MG tablet, CMP14+EGFR  Seasonal allergic rhinitis - Plan: cetirizine (ZYRTEC) 10 MG tablet  Unspecified vitamin D deficiency - Plan: Vit D  25 hydroxy (rtn osteoporosis monitoring)  HLD (hyperlipidemia) - Plan: NMR, lipoprofile  PLAN: Orders Placed This  Encounter  Procedures  . CMP14+EGFR  . NMR, lipoprofile  . TSH  . Vit D  25 hydroxy (rtn osteoporosis monitoring)  . POCT glycosylated hemoglobin (Hb A1C)  . POCT UA - Microalbumin    Meds ordered this encounter  Medications  . lisinopril (PRINIVIL,ZESTRIL) 20 MG tablet    Sig: Take 1 tablet (20 mg total) by mouth daily.    Dispense:  30 tablet    Refill:  11    Now due for office visit.  Marland Kitchen cetirizine (ZYRTEC) 10 MG tablet    Sig: Take 1 tablet (10 mg total) by mouth at bedtime.    Dispense:  30 tablet    Refill:  5  . metoprolol (LOPRESSOR) 100 MG tablet    Sig: Take 1 tablet (100 mg total) by mouth 2 (two) times daily.    Dispense:  60 tablet    Refill:  11  . metFORMIN (GLUCOPHAGE) 1000 MG tablet    Sig: Take 1 tablet  (1,000 mg total) by mouth 2 (two) times daily with a meal.    Dispense:  60 tablet    Refill:  11    Needs glucose level drawn before next refill  . chlorthalidone (HYGROTON) 25 MG tablet    Sig: Take 1 tablet (25 mg total) by mouth daily.    Dispense:  30 tablet    Refill:  11    Must be seen before next refill  . levothyroxine (SYNTHROID, LEVOTHROID) 137 MCG tablet    Sig: Take 1 tablet (137 mcg total) by mouth daily.    Dispense:  30 tablet    Refill:  11    Past due for follow up. Needs to be seen before this Rx runs out.    Order Specific Question:  Supervising Provider    Answer:  Ernestina Penna [[id number]]    Diet exercise and keep active. Annual eye exam. Daily footcare. Regularity of appointments emphasized to monitor diseases and medications.  Return in about 4 months (around 11/02/2013) for Recheck medical problems.  Hiroto Saltzman P. [place] Charon, M.D.

## 2013-07-05 DIAGNOSIS — R82998 Other abnormal findings in urine: Secondary | ICD-10-CM | POA: Diagnosis not present

## 2013-07-05 DIAGNOSIS — N200 Calculus of kidney: Secondary | ICD-10-CM | POA: Diagnosis not present

## 2013-07-05 DIAGNOSIS — N3020 Other chronic cystitis without hematuria: Secondary | ICD-10-CM | POA: Diagnosis not present

## 2013-07-05 LAB — VITAMIN D 25 HYDROXY (VIT D DEFICIENCY, FRACTURES): Vit D, 25-Hydroxy: 47 ng/mL (ref 30.0–100.0)

## 2013-07-05 LAB — CMP14+EGFR
ALT: 26 IU/L (ref 0–32)
AST: 22 IU/L (ref 0–40)
Albumin/Globulin Ratio: 1.8 (ref 1.1–2.5)
Albumin: 4.1 g/dL (ref 3.6–4.8)
Alkaline Phosphatase: 71 IU/L (ref 39–117)
BUN/Creatinine Ratio: 20 (ref 11–26)
BUN: 29 mg/dL — ABNORMAL HIGH (ref 8–27)
CO2: 22 mmol/L (ref 18–29)
Calcium: 9.8 mg/dL (ref 8.6–10.2)
Chloride: 99 mmol/L (ref 97–108)
Creatinine, Ser: 1.43 mg/dL — ABNORMAL HIGH (ref 0.57–1.00)
GFR calc Af Amer: 44 mL/min/{1.73_m2} — ABNORMAL LOW (ref >59)
GFR calc non Af Amer: 38 mL/min/{1.73_m2} — ABNORMAL LOW (ref >59)
Globulin, Total: 2.3 g/dL (ref 1.5–4.5)
Glucose: 335 mg/dL — ABNORMAL HIGH (ref 65–99)
Potassium: 5 mmol/L (ref 3.5–5.2)
Sodium: 140 mmol/L (ref 134–144)
Total Bilirubin: 0.2 mg/dL (ref 0.0–1.2)
Total Protein: 6.4 g/dL (ref 6.0–8.5)

## 2013-07-05 LAB — NMR, LIPOPROFILE
Cholesterol: 214 mg/dL — ABNORMAL HIGH (ref <200)
HDL Cholesterol by NMR: 36 mg/dL — ABNORMAL LOW (ref >=40)
HDL Particle Number: 25.8 umol/L — ABNORMAL LOW (ref >=30.5)
LDL Particle Number: 1984 nmol/L — ABNORMAL HIGH (ref <1000)
LDL Size: 19.5 nm — ABNORMAL LOW (ref >20.5)
LP-IR Score: 77 — ABNORMAL HIGH (ref <=45)
Small LDL Particle Number: 1604 nmol/L — ABNORMAL HIGH (ref <=527)
Triglycerides by NMR: 456 mg/dL — ABNORMAL HIGH (ref <150)

## 2013-07-05 LAB — TSH: TSH: 1.38 u[IU]/mL (ref 0.450–4.500)

## 2013-07-12 ENCOUNTER — Telehealth: Payer: Self-pay | Admitting: Family Medicine

## 2013-07-12 NOTE — Telephone Encounter (Signed)
Pt aware of labs  

## 2013-07-13 ENCOUNTER — Ambulatory Visit: Payer: Self-pay | Admitting: Family Medicine

## 2013-07-26 ENCOUNTER — Ambulatory Visit: Payer: BC Managed Care – PPO

## 2013-08-29 DIAGNOSIS — M25569 Pain in unspecified knee: Secondary | ICD-10-CM | POA: Diagnosis not present

## 2013-08-29 DIAGNOSIS — M1710 Unilateral primary osteoarthritis, unspecified knee: Secondary | ICD-10-CM | POA: Diagnosis not present

## 2014-02-01 ENCOUNTER — Other Ambulatory Visit: Payer: Self-pay | Admitting: Family Medicine

## 2014-02-04 NOTE — Telephone Encounter (Signed)
Call patient : Prescription refilled & sent to pharmacy in [facility]. 

## 2014-02-04 NOTE — Telephone Encounter (Signed)
Last seen 07/03/13  FP[facility]

## 2014-02-26 ENCOUNTER — Other Ambulatory Visit (HOSPITAL_COMMUNITY): Payer: Self-pay | Admitting: Orthopaedic Surgery

## 2014-03-27 ENCOUNTER — Encounter (HOSPITAL_COMMUNITY): Payer: Self-pay | Admitting: Pharmacy Technician

## 2014-04-02 ENCOUNTER — Encounter (HOSPITAL_COMMUNITY)
Admission: RE | Admit: 2014-04-02 | Discharge: 2014-04-02 | Disposition: A | Discharge status: Home / Self Care | Payer: BC Managed Care – PPO | Source: Ambulatory Visit | Attending: Orthopaedic Surgery | Admitting: Orthopaedic Surgery

## 2014-04-02 ENCOUNTER — Encounter (HOSPITAL_COMMUNITY): Payer: Self-pay

## 2014-04-02 ENCOUNTER — Ambulatory Visit (HOSPITAL_COMMUNITY)
Admission: RE | Admit: 2014-04-02 | Discharge: 2014-04-02 | Disposition: A | Discharge status: Home / Self Care | Payer: BC Managed Care – PPO | Source: Ambulatory Visit | Attending: Anesthesiology | Admitting: Anesthesiology

## 2014-04-02 DIAGNOSIS — Z01812 Encounter for preprocedural laboratory examination: Secondary | ICD-10-CM | POA: Insufficient documentation

## 2014-04-02 DIAGNOSIS — E039 Hypothyroidism, unspecified: Secondary | ICD-10-CM | POA: Diagnosis not present

## 2014-04-02 DIAGNOSIS — Z01818 Encounter for other preprocedural examination: Secondary | ICD-10-CM | POA: Diagnosis not present

## 2014-04-02 DIAGNOSIS — Z79899 Other long term (current) drug therapy: Secondary | ICD-10-CM | POA: Diagnosis not present

## 2014-04-02 DIAGNOSIS — IMO0002 Reserved for concepts with insufficient information to code with codable children: Secondary | ICD-10-CM | POA: Diagnosis not present

## 2014-04-02 DIAGNOSIS — M1710 Unilateral primary osteoarthritis, unspecified knee: Secondary | ICD-10-CM | POA: Insufficient documentation

## 2014-04-02 DIAGNOSIS — E119 Type 2 diabetes mellitus without complications: Secondary | ICD-10-CM | POA: Diagnosis not present

## 2014-04-02 DIAGNOSIS — I10 Essential (primary) hypertension: Secondary | ICD-10-CM | POA: Diagnosis not present

## 2014-04-02 LAB — URINE MICROSCOPIC-ADD ON

## 2014-04-02 LAB — CBC
HEMATOCRIT: 35.4 % — AB (ref 36.0–46.0)
HEMOGLOBIN: 12.2 g/dL (ref 12.0–15.0)
MCH: 30.7 pg (ref 26.0–34.0)
MCHC: 34.5 g/dL (ref 30.0–36.0)
MCV: 88.9 fL (ref 78.0–100.0)
Platelets: 215 10*3/uL (ref 150–400)
RBC: 3.98 MIL/uL (ref 3.87–5.11)
RDW: 13.4 % (ref 11.5–15.5)
WBC: 7.2 10*3/uL (ref 4.0–10.5)

## 2014-04-02 LAB — URINALYSIS, ROUTINE W REFLEX MICROSCOPIC
Bilirubin Urine: NEGATIVE
GLUCOSE, UA: NEGATIVE mg/dL
Ketones, ur: NEGATIVE mg/dL
Nitrite: NEGATIVE
Protein, ur: 100 mg/dL — AB
SPECIFIC GRAVITY, URINE: 1.025 (ref 1.005–1.030)
Urobilinogen, UA: 0.2 mg/dL (ref 0.0–1.0)
pH: 5 (ref 5.0–8.0)

## 2014-04-02 LAB — BASIC METABOLIC PANEL
BUN: 26 mg/dL — AB (ref 6–23)
CHLORIDE: 106 meq/L (ref 96–112)
CO2: 24 mEq/L (ref 19–32)
Calcium: 9.5 mg/dL (ref 8.4–10.5)
Creatinine, Ser: 1.3 mg/dL — ABNORMAL HIGH (ref 0.50–1.10)
GFR calc non Af Amer: 41 mL/min — ABNORMAL LOW (ref >90)
GFR, EST AFRICAN AMERICAN: 48 mL/min — AB (ref >90)
Glucose, Bld: 154 mg/dL — ABNORMAL HIGH (ref 70–99)
Potassium: 5 mEq/L (ref 3.7–5.3)
Sodium: 143 mEq/L (ref 137–147)

## 2014-04-02 LAB — TYPE AND SCREEN
ABO/RH(D): A POS
Antibody Screen: NEGATIVE

## 2014-04-02 LAB — SURGICAL PCR SCREEN
MRSA, PCR: NEGATIVE
Staphylococcus aureus: NEGATIVE

## 2014-04-02 LAB — APTT: aPTT: 28 seconds (ref 24–37)

## 2014-04-02 LAB — PROTIME-INR
INR: 0.96 (ref 0.00–1.49)
Prothrombin Time: 12.6 seconds (ref 11.6–15.2)

## 2014-04-02 LAB — ABO/RH: ABO/RH(D): A POS

## 2014-04-02 NOTE — Pre-Procedure Instructions (Signed)
Meredith FiremanMarie S Shaw  04/02/2014   Your procedure is scheduled on: Tuesday, April 09, 2014  [facility] at 10:15 AM.  Call this number if you have problems the morning of surgery: [phone]   Remember:   Do not eat food or drink liquids after midnight Monday, April 08, 2014   Take these medicines the morning of surgery with A SIP OF WATER: cetirizine (ZYRTEC), levothyroxine (SYNTHROID), metoprolol (LOPRESSOR). Stop taking Aspirin, vitamins and herbal medications. Do not take any NSAIDs ie: Ibuprofen, Advil, Naproxen or any medication containing Aspirin. Stop 5 days prior to procedure (Thursday 04/04/14).  Do not wear jewelry, make-up or nail polish.  Do not wear lotions, powders, or perfumes. You may wear deodorant.  Do not shave 48 hours prior to surgery.   Do not bring valuables to the hospital.  [facility] not [facility]  for any belongings or valuables.               Contacts, dentures or bridgework may not be worn into surgery.  Leave suitcase in the car. After surgery it may be brought to your room.  For patients admitted to the hospital, discharge time is determined by your treatment team.               Patients discharged the day of surgery will not be allowed to drive home.  Name and phone number of your driver:   Special Instructions:  Special Instructions:Special Instructions: [facility] - Pre[facility]  Before surgery, you can play an important role.  Because skin is not sterile, your skin needs to be as free of germs as possible.  You can reduce the number of germs on you skin by washing with CHG (chlorahexidine gluconate) soap before surgery.  CHG is an antiseptic cleaner which kills germs and bonds with the skin to continue killing germs even after washing.  Please DO NOT use if you have an allergy to CHG or antibacterial soaps.  If your skin becomes reddened/irritated stop using the CHG and inform your nurse when you arrive at Short  Stay.  Do not shave (including legs and underarms) for at least 48 hours prior to the first CHG shower.  You may shave your face.  Please follow these instructions carefully:   1.  Shower with CHG Soap the night before surgery and the morning of Surgery.  2.  If you choose to wash your hair, wash your hair first as usual with your normal shampoo.  3.  After you shampoo, rinse your hair and body thoroughly to remove the Shampoo.  4.  Use CHG as you would any other liquid soap.  You can apply chg directly  to the skin and wash gently with scrungie or a clean washcloth.  5.  Apply the CHG Soap to your body ONLY FROM THE NECK DOWN.  Do not use on open wounds or open sores.  Avoid contact with your eyes, ears, mouth and genitals (private parts).  Wash genitals (private parts) with your normal soap.  6.  Wash thoroughly, paying special attention to the area where your surgery will be performed.  7.  Thoroughly rinse your body with warm water from the neck down.  8.  DO NOT shower/wash with your normal soap after using and rinsing off the CHG Soap.  9.  Pat yourself dry with a clean towel.            10.  Wear clean pajamas.  11.  Place clean sheets on your bed the night of your first shower and do not sleep with pets.  Day of Surgery  Do not apply any lotions  the morning of surgery.  Please wear clean clothes to the hospital/surgery center.   Please read over the following fact sheets that you were given: Pain Booklet, Coughing and Deep Breathing, Blood Transfusion Information, Total Joint Packet, MRSA Information and Surgical Site Infection Prevention

## 2014-04-02 NOTE — Progress Notes (Signed)
Pt denies SOB, chest pain, and being under the care of a cardiologist. Pt denies having a chest x ray and EKG within the last year. Pt denies having a stress test, echo, and cardiac cath. Spoke with Lauren at Dr. Eliberto IvoryBlackman's office to make MD aware that pt UA was abnormal.

## 2014-04-08 MED ORDER — CLINDAMYCIN PHOSPHATE 900 MG/50ML IV SOLN
900.0000 mg | INTRAVENOUS | Status: AC
  Administered 2014-04-09: 900 mg via INTRAVENOUS
  Filled 2014-04-08: qty 50

## 2014-04-09 ENCOUNTER — Encounter (HOSPITAL_COMMUNITY): Payer: Self-pay | Admitting: Surgery

## 2014-04-09 ENCOUNTER — Encounter (HOSPITAL_COMMUNITY)
Admission: RE | Disposition: A | Discharge status: Home Health | Payer: Self-pay | Source: Ambulatory Visit | Attending: Orthopaedic Surgery

## 2014-04-09 ENCOUNTER — Inpatient Hospital Stay (HOSPITAL_COMMUNITY): Payer: BC Managed Care – PPO | Admitting: Certified Registered Nurse Anesthetist

## 2014-04-09 ENCOUNTER — Inpatient Hospital Stay (HOSPITAL_COMMUNITY): Payer: BC Managed Care – PPO

## 2014-04-09 ENCOUNTER — Encounter (HOSPITAL_COMMUNITY): Payer: BC Managed Care – PPO | Admitting: Certified Registered Nurse Anesthetist

## 2014-04-09 ENCOUNTER — Inpatient Hospital Stay (HOSPITAL_COMMUNITY)
Admission: RE | Admit: 2014-04-09 | Discharge: 2014-04-11 | DRG: 470 | Disposition: A | Discharge status: Home Health | Payer: BC Managed Care – PPO | Source: Ambulatory Visit | Attending: Orthopaedic Surgery | Admitting: Orthopaedic Surgery

## 2014-04-09 DIAGNOSIS — Z96659 Presence of unspecified artificial knee joint: Secondary | ICD-10-CM | POA: Diagnosis not present

## 2014-04-09 DIAGNOSIS — M25569 Pain in unspecified knee: Secondary | ICD-10-CM | POA: Diagnosis not present

## 2014-04-09 DIAGNOSIS — E039 Hypothyroidism, unspecified: Secondary | ICD-10-CM | POA: Diagnosis present

## 2014-04-09 DIAGNOSIS — M1712 Unilateral primary osteoarthritis, left knee: Secondary | ICD-10-CM

## 2014-04-09 DIAGNOSIS — I10 Essential (primary) hypertension: Secondary | ICD-10-CM | POA: Diagnosis present

## 2014-04-09 DIAGNOSIS — N289 Disorder of kidney and ureter, unspecified: Secondary | ICD-10-CM | POA: Diagnosis present

## 2014-04-09 DIAGNOSIS — Z96652 Presence of left artificial knee joint: Secondary | ICD-10-CM

## 2014-04-09 DIAGNOSIS — Z471 Aftercare following joint replacement surgery: Secondary | ICD-10-CM | POA: Diagnosis not present

## 2014-04-09 DIAGNOSIS — M1710 Unilateral primary osteoarthritis, unspecified knee: Principal | ICD-10-CM | POA: Diagnosis present

## 2014-04-09 DIAGNOSIS — E119 Type 2 diabetes mellitus without complications: Secondary | ICD-10-CM | POA: Diagnosis present

## 2014-04-09 DIAGNOSIS — M2540 Effusion, unspecified joint: Secondary | ICD-10-CM | POA: Diagnosis present

## 2014-04-09 DIAGNOSIS — D62 Acute posthemorrhagic anemia: Secondary | ICD-10-CM | POA: Diagnosis not present

## 2014-04-09 DIAGNOSIS — Z880 Allergy status to penicillin: Secondary | ICD-10-CM | POA: Diagnosis not present

## 2014-04-09 DIAGNOSIS — G8918 Other acute postprocedural pain: Secondary | ICD-10-CM | POA: Diagnosis not present

## 2014-04-09 DIAGNOSIS — J438 Other emphysema: Secondary | ICD-10-CM | POA: Diagnosis present

## 2014-04-09 HISTORY — PX: TOTAL KNEE ARTHROPLASTY: SHX125

## 2014-04-09 LAB — GLUCOSE, CAPILLARY
GLUCOSE-CAPILLARY: 299 mg/dL — AB (ref 70–99)
Glucose-Capillary: 229 mg/dL — ABNORMAL HIGH (ref 70–99)
Glucose-Capillary: 151 mg/dL — ABNORMAL HIGH (ref 70–99)
Glucose-Capillary: 171 mg/dL — ABNORMAL HIGH (ref 70–99)
Glucose-Capillary: 230 mg/dL — ABNORMAL HIGH (ref 70–99)

## 2014-04-09 SURGERY — ARTHROPLASTY, KNEE, TOTAL
Anesthesia: General | Site: Knee | Laterality: Left

## 2014-04-09 MED ORDER — DOCUSATE SODIUM 100 MG PO CAPS
100.0000 mg | ORAL_CAPSULE | Freq: Two times a day (BID) | ORAL | Status: DC
  Administered 2014-04-09 – 2014-04-11 (×4): 100 mg via ORAL
  Filled 2014-04-09 (×5): qty 1

## 2014-04-09 MED ORDER — ONDANSETRON HCL 4 MG/2ML IJ SOLN
4.0000 mg | Freq: Four times a day (QID) | INTRAMUSCULAR | Status: DC | PRN
  Administered 2014-04-09: 4 mg via INTRAVENOUS
  Filled 2014-04-09: qty 2

## 2014-04-09 MED ORDER — EPHEDRINE SULFATE 50 MG/ML IJ SOLN
INTRAMUSCULAR | Status: DC | PRN
  Administered 2014-04-09 (×2): 5 mg via INTRAVENOUS
  Administered 2014-04-09: 10 mg via INTRAVENOUS

## 2014-04-09 MED ORDER — SODIUM CHLORIDE 0.9 % IV SOLN
INTRAVENOUS | Status: DC
  Administered 2014-04-09 – 2014-04-10 (×2): via INTRAVENOUS

## 2014-04-09 MED ORDER — PROPOFOL 10 MG/ML IV BOLUS
INTRAVENOUS | Status: DC | PRN
  Administered 2014-04-09: 150 mg via INTRAVENOUS
  Administered 2014-04-09: 50 mg via INTRAVENOUS

## 2014-04-09 MED ORDER — ONDANSETRON HCL 4 MG PO TABS
4.0000 mg | ORAL_TABLET | Freq: Four times a day (QID) | ORAL | Status: DC | PRN
  Administered 2014-04-11: 4 mg via ORAL
  Filled 2014-04-09: qty 1

## 2014-04-09 MED ORDER — NEOSTIGMINE METHYLSULFATE 10 MG/10ML IV SOLN
INTRAVENOUS | Status: AC
  Filled 2014-04-09: qty 1

## 2014-04-09 MED ORDER — MIDAZOLAM HCL 2 MG/2ML IJ SOLN
INTRAMUSCULAR | Status: AC
  Administered 2014-04-09: 2 mg
  Filled 2014-04-09: qty 2

## 2014-04-09 MED ORDER — INSULIN ASPART 100 UNIT/ML ~~LOC~~ SOLN
0.0000 [IU] | Freq: Every day | SUBCUTANEOUS | Status: DC
  Administered 2014-04-09: 3 [IU] via SUBCUTANEOUS
  Administered 2014-04-10: 2 [IU] via SUBCUTANEOUS

## 2014-04-09 MED ORDER — PANTOPRAZOLE SODIUM 40 MG PO TBEC
40.0000 mg | DELAYED_RELEASE_TABLET | Freq: Every day | ORAL | Status: DC
  Administered 2014-04-10 – 2014-04-11 (×2): 40 mg via ORAL
  Filled 2014-04-09 (×2): qty 1

## 2014-04-09 MED ORDER — LACTATED RINGERS IV SOLN
INTRAVENOUS | Status: DC
  Administered 2014-04-09: 11:00:00 via INTRAVENOUS

## 2014-04-09 MED ORDER — MIDAZOLAM HCL 2 MG/2ML IJ SOLN: 2.0000 mg | Freq: Once | INTRAMUSCULAR | Status: DC

## 2014-04-09 MED ORDER — ROCURONIUM BROMIDE 50 MG/5ML IV SOLN
INTRAVENOUS | Status: AC
  Filled 2014-04-09: qty 1

## 2014-04-09 MED ORDER — DIPHENHYDRAMINE HCL 12.5 MG/5ML PO ELIX: 12.5000 mg | ORAL_SOLUTION | ORAL | Status: DC | PRN

## 2014-04-09 MED ORDER — PHENYLEPHRINE 40 MCG/ML (10ML) SYRINGE FOR IV PUSH (FOR BLOOD PRESSURE SUPPORT)
PREFILLED_SYRINGE | INTRAVENOUS | Status: AC
  Filled 2014-04-09: qty 10

## 2014-04-09 MED ORDER — METOCLOPRAMIDE HCL 5 MG/ML IJ SOLN
5.0000 mg | Freq: Three times a day (TID) | INTRAMUSCULAR | Status: DC | PRN
  Administered 2014-04-09: 10 mg via INTRAVENOUS
  Filled 2014-04-09: qty 2

## 2014-04-09 MED ORDER — FENTANYL CITRATE 0.05 MG/ML IJ SOLN
INTRAMUSCULAR | Status: AC
  Administered 2014-04-09: 100 ug via INTRAVENOUS
  Filled 2014-04-09: qty 2

## 2014-04-09 MED ORDER — INSULIN ASPART 100 UNIT/ML ~~LOC~~ SOLN
0.0000 [IU] | Freq: Three times a day (TID) | SUBCUTANEOUS | Status: DC
Start: 2014-04-09 — End: 2014-04-11
  Administered 2014-04-10 – 2014-04-11 (×3): 5 [IU] via SUBCUTANEOUS

## 2014-04-09 MED ORDER — DEXTROSE 5 % IV SOLN
500.0000 mg | Freq: Four times a day (QID) | INTRAVENOUS | Status: DC | PRN
  Filled 2014-04-09: qty 5

## 2014-04-09 MED ORDER — ALUM & MAG HYDROXIDE-SIMETH 200-200-20 MG/5ML PO SUSP: 30.0000 mL | ORAL | Status: DC | PRN

## 2014-04-09 MED ORDER — GLYCOPYRROLATE 0.2 MG/ML IJ SOLN
INTRAMUSCULAR | Status: DC | PRN
  Administered 2014-04-09: 0.2 mg via INTRAVENOUS

## 2014-04-09 MED ORDER — METHOCARBAMOL 500 MG PO TABS
500.0000 mg | ORAL_TABLET | Freq: Four times a day (QID) | ORAL | Status: DC | PRN
  Administered 2014-04-09 – 2014-04-11 (×6): 500 mg via ORAL
  Filled 2014-04-09 (×5): qty 1

## 2014-04-09 MED ORDER — ASPIRIN EC 325 MG PO TBEC
325.0000 mg | DELAYED_RELEASE_TABLET | Freq: Two times a day (BID) | ORAL | Status: DC
  Administered 2014-04-09 – 2014-04-11 (×4): 325 mg via ORAL
  Filled 2014-04-09 (×6): qty 1

## 2014-04-09 MED ORDER — ONDANSETRON HCL 4 MG/2ML IJ SOLN
INTRAMUSCULAR | Status: DC | PRN
  Administered 2014-04-09: 4 mg via INTRAVENOUS

## 2014-04-09 MED ORDER — ARTIFICIAL TEARS OP OINT
TOPICAL_OINTMENT | OPHTHALMIC | Status: DC | PRN
  Administered 2014-04-09: 1 via OPHTHALMIC

## 2014-04-09 MED ORDER — VITAMIN D3 125 MCG (5000 UT) PO CAPS
5000.0000 mg | ORAL_CAPSULE | Freq: Every day | ORAL | Status: DC
Start: 2014-04-09 — End: 2014-04-09

## 2014-04-09 MED ORDER — LEVOTHYROXINE SODIUM 137 MCG PO TABS
137.0000 ug | ORAL_TABLET | Freq: Every day | ORAL | Status: DC
  Administered 2014-04-10 – 2014-04-11 (×2): 137 ug via ORAL
  Filled 2014-04-09 (×3): qty 1

## 2014-04-09 MED ORDER — PHENYLEPHRINE HCL 10 MG/ML IJ SOLN
INTRAMUSCULAR | Status: DC | PRN
  Administered 2014-04-09 (×2): 40 ug via INTRAVENOUS
  Administered 2014-04-09: 80 ug via INTRAVENOUS

## 2014-04-09 MED ORDER — ACETAMINOPHEN 325 MG PO TABS: 650.0000 mg | ORAL_TABLET | Freq: Four times a day (QID) | ORAL | Status: DC | PRN

## 2014-04-09 MED ORDER — POLYETHYLENE GLYCOL 3350 17 G PO PACK: 17.0000 g | PACK | Freq: Every day | ORAL | Status: DC | PRN

## 2014-04-09 MED ORDER — MIDAZOLAM HCL 2 MG/2ML IJ SOLN
INTRAMUSCULAR | Status: AC
  Filled 2014-04-09: qty 2

## 2014-04-09 MED ORDER — LIDOCAINE HCL (CARDIAC) 20 MG/ML IV SOLN
INTRAVENOUS | Status: DC | PRN
  Administered 2014-04-09 (×2): 50 mg via INTRAVENOUS

## 2014-04-09 MED ORDER — METHOCARBAMOL 500 MG PO TABS
ORAL_TABLET | ORAL | Status: AC
  Filled 2014-04-09: qty 1

## 2014-04-09 MED ORDER — SODIUM CHLORIDE 0.9 % IR SOLN
Status: DC | PRN
  Administered 2014-04-09: 3000 mL
  Administered 2014-04-09: 1000 mL

## 2014-04-09 MED ORDER — VITAMIN D3 25 MCG (1000 UNIT) PO TABS
5000.0000 [IU] | ORAL_TABLET | Freq: Every day | ORAL | Status: DC
  Administered 2014-04-10 – 2014-04-11 (×2): 5000 [IU] via ORAL
  Filled 2014-04-09 (×2): qty 5

## 2014-04-09 MED ORDER — PHENOL 1.4 % MT LIQD: 1.0000 | OROMUCOSAL | Status: DC | PRN

## 2014-04-09 MED ORDER — BUPIVACAINE HCL (PF) 0.25 % IJ SOLN
INTRAMUSCULAR | Status: AC
  Filled 2014-04-09: qty 30

## 2014-04-09 MED ORDER — FENTANYL CITRATE 0.05 MG/ML IJ SOLN
100.0000 ug | Freq: Once | INTRAMUSCULAR | Status: AC
  Administered 2014-04-09: 100 ug via INTRAVENOUS

## 2014-04-09 MED ORDER — ARTIFICIAL TEARS OP OINT
TOPICAL_OINTMENT | OPHTHALMIC | Status: AC
  Filled 2014-04-09: qty 3.5

## 2014-04-09 MED ORDER — PROPOFOL 10 MG/ML IV BOLUS
INTRAVENOUS | Status: AC
  Filled 2014-04-09: qty 20

## 2014-04-09 MED ORDER — FENTANYL CITRATE 0.05 MG/ML IJ SOLN
INTRAMUSCULAR | Status: AC
  Filled 2014-04-09: qty 5

## 2014-04-09 MED ORDER — METFORMIN HCL 500 MG PO TABS
1000.0000 mg | ORAL_TABLET | Freq: Two times a day (BID) | ORAL | Status: DC
  Administered 2014-04-09 – 2014-04-11 (×4): 1000 mg via ORAL
  Filled 2014-04-09 (×6): qty 2

## 2014-04-09 MED ORDER — METOPROLOL TARTRATE 100 MG PO TABS
100.0000 mg | ORAL_TABLET | Freq: Two times a day (BID) | ORAL | Status: DC
  Administered 2014-04-09 – 2014-04-11 (×4): 100 mg via ORAL
  Filled 2014-04-09 (×5): qty 1

## 2014-04-09 MED ORDER — MENTHOL 3 MG MT LOZG: 1.0000 | LOZENGE | OROMUCOSAL | Status: DC | PRN

## 2014-04-09 MED ORDER — GLYCOPYRROLATE 0.2 MG/ML IJ SOLN
INTRAMUSCULAR | Status: AC
  Filled 2014-04-09: qty 3

## 2014-04-09 MED ORDER — LACTATED RINGERS IV SOLN
INTRAVENOUS | Status: DC | PRN
  Administered 2014-04-09 (×2): via INTRAVENOUS

## 2014-04-09 MED ORDER — HYDROMORPHONE HCL PF 1 MG/ML IJ SOLN
1.0000 mg | INTRAMUSCULAR | Status: DC | PRN
  Administered 2014-04-09 (×2): 1 mg via INTRAVENOUS
  Filled 2014-04-09 (×2): qty 1

## 2014-04-09 MED ORDER — ACETAMINOPHEN 650 MG RE SUPP: 650.0000 mg | Freq: Four times a day (QID) | RECTAL | Status: DC | PRN

## 2014-04-09 MED ORDER — CLINDAMYCIN PHOSPHATE 600 MG/50ML IV SOLN
600.0000 mg | Freq: Four times a day (QID) | INTRAVENOUS | Status: AC
  Administered 2014-04-09 (×2): 600 mg via INTRAVENOUS
  Filled 2014-04-09 (×2): qty 50

## 2014-04-09 MED ORDER — FENTANYL CITRATE 0.05 MG/ML IJ SOLN
INTRAMUSCULAR | Status: DC | PRN
  Administered 2014-04-09: 100 ug via INTRAVENOUS
  Administered 2014-04-09: 50 ug via INTRAVENOUS

## 2014-04-09 MED ORDER — LIDOCAINE HCL (CARDIAC) 20 MG/ML IV SOLN
INTRAVENOUS | Status: AC
  Filled 2014-04-09: qty 5

## 2014-04-09 MED ORDER — FENTANYL CITRATE 0.05 MG/ML IJ SOLN
INTRAMUSCULAR | Status: AC
  Filled 2014-04-09: qty 2

## 2014-04-09 MED ORDER — OXYCODONE HCL 5 MG PO TABS
5.0000 mg | ORAL_TABLET | ORAL | Status: DC | PRN
  Administered 2014-04-09: 5 mg via ORAL
  Administered 2014-04-10 – 2014-04-11 (×12): 10 mg via ORAL
  Filled 2014-04-09 (×12): qty 2

## 2014-04-09 MED ORDER — ONDANSETRON HCL 4 MG/2ML IJ SOLN
INTRAMUSCULAR | Status: AC
  Filled 2014-04-09: qty 2

## 2014-04-09 MED ORDER — FENTANYL CITRATE 0.05 MG/ML IJ SOLN
25.0000 ug | INTRAMUSCULAR | Status: DC | PRN
  Administered 2014-04-09 (×2): 25 ug via INTRAVENOUS

## 2014-04-09 MED ORDER — STERILE WATER FOR IRRIGATION IR SOLN
Status: DC | PRN
  Administered 2014-04-09: 1000 mL

## 2014-04-09 MED ORDER — METOCLOPRAMIDE HCL 10 MG PO TABS: 5.0000 mg | ORAL_TABLET | Freq: Three times a day (TID) | ORAL | Status: DC | PRN

## 2014-04-09 MED ORDER — TRANEXAMIC ACID 100 MG/ML IV SOLN
1000.0000 mg | INTRAVENOUS | Status: AC
  Administered 2014-04-09: 1000 mg via INTRAVENOUS
  Filled 2014-04-09: qty 10

## 2014-04-09 MED ORDER — OXYCODONE HCL 5 MG PO TABS
ORAL_TABLET | ORAL | Status: AC
  Filled 2014-04-09: qty 1

## 2014-04-09 SURGICAL SUPPLY — 63 items
BANDAGE ELASTIC 6 VELCRO ST LF (GAUZE/BANDAGES/DRESSINGS) ×4 IMPLANT
BANDAGE ESMARK 6X9 LF (GAUZE/BANDAGES/DRESSINGS) ×1 IMPLANT
BLADE SAG 18X100X1.27 (BLADE) ×3 IMPLANT
BNDG CMPR 9X6 STRL LF SNTH (GAUZE/BANDAGES/DRESSINGS) ×1
BNDG ESMARK 6X9 LF (GAUZE/BANDAGES/DRESSINGS) ×3
BOWL SMART MIX CTS (DISPOSABLE) ×2 IMPLANT
CEMENT BONE SIMPLEX SPEEDSET (Cement) ×4 IMPLANT
CLOSURE WOUND 1/2 X4 (GAUZE/BANDAGES/DRESSINGS) ×2
COVER SURGICAL LIGHT HANDLE (MISCELLANEOUS) ×3 IMPLANT
CUFF TOURNIQUET SINGLE 34IN LL (TOURNIQUET CUFF) ×3 IMPLANT
CUFF TOURNIQUET SINGLE 44IN (TOURNIQUET CUFF) IMPLANT
DRAPE INCISE IOBAN 66X45 STRL (DRAPES) ×3 IMPLANT
DRAPE ORTHO SPLIT 77X108 STRL (DRAPES) ×6
DRAPE PROXIMA HALF (DRAPES) ×3 IMPLANT
DRAPE SURG ORHT 6 SPLT 77X108 (DRAPES) ×2 IMPLANT
DRAPE U-SHAPE 47X51 STRL (DRAPES) ×3 IMPLANT
DRSG PAD ABDOMINAL 8X10 ST (GAUZE/BANDAGES/DRESSINGS) ×1 IMPLANT
DURAPREP 26ML APPLICATOR (WOUND CARE) ×3 IMPLANT
ELECT REM PT RETURN 9FT ADLT (ELECTROSURGICAL) ×3
ELECTRODE REM PT RTRN 9FT ADLT (ELECTROSURGICAL) ×1 IMPLANT
EVACUATOR 1/8 PVC DRAIN (DRAIN) ×2 IMPLANT
FACESHIELD WRAPAROUND (MASK) ×15 IMPLANT
FACESHIELD WRAPAROUND OR TEAM (MASK) ×3 IMPLANT
GAUZE XEROFORM 1X8 LF (GAUZE/BANDAGES/DRESSINGS) ×3 IMPLANT
GLOVE BIO SURGEON STRL SZ8 (GLOVE) ×6 IMPLANT
GLOVE BIOGEL PI IND STRL 8 (GLOVE) ×1 IMPLANT
GLOVE BIOGEL PI INDICATOR 8 (GLOVE) ×2
GLOVE ECLIPSE 7.0 STRL STRAW (GLOVE) ×3 IMPLANT
GLOVE ORTHO TXT STRL SZ7.5 (GLOVE) ×5 IMPLANT
GOWN STRL REUS W/ TWL LRG LVL3 (GOWN DISPOSABLE) ×2 IMPLANT
GOWN STRL REUS W/ TWL XL LVL3 (GOWN DISPOSABLE) ×4 IMPLANT
GOWN STRL REUS W/TWL LRG LVL3 (GOWN DISPOSABLE) ×6
GOWN STRL REUS W/TWL XL LVL3 (GOWN DISPOSABLE) ×12
HANDPIECE INTERPULSE COAX TIP (DISPOSABLE) ×3
IMMOBILIZER KNEE 22 UNIV (SOFTGOODS) IMPLANT
KIT BASIN OR (CUSTOM PROCEDURE TRAY) ×3 IMPLANT
KIT ROOM TURNOVER OR (KITS) ×3 IMPLANT
KNEE/VIT E POLY LINER LEVEL 1B ×2 IMPLANT
MANIFOLD NEPTUNE II (INSTRUMENTS) ×3 IMPLANT
NS IRRIG 1000ML POUR BTL (IV SOLUTION) ×3 IMPLANT
PACK TOTAL JOINT (CUSTOM PROCEDURE TRAY) ×3 IMPLANT
PAD ABD 8X10 STRL (GAUZE/BANDAGES/DRESSINGS) ×2 IMPLANT
PAD ARMBOARD 7.5X6 YLW CONV (MISCELLANEOUS) ×4 IMPLANT
PADDING CAST COTTON 6X4 STRL (CAST SUPPLIES) ×3 IMPLANT
SET HNDPC FAN SPRY TIP SCT (DISPOSABLE) IMPLANT
SET PAD KNEE POSITIONER (MISCELLANEOUS) ×3 IMPLANT
SPONGE GAUZE 4X4 12PLY (GAUZE/BANDAGES/DRESSINGS) ×3 IMPLANT
SPONGE GAUZE 4X4 12PLY STER LF (GAUZE/BANDAGES/DRESSINGS) ×2 IMPLANT
STAPLER VISISTAT 35W (STAPLE) IMPLANT
STRIP CLOSURE SKIN 1/2X4 (GAUZE/BANDAGES/DRESSINGS) ×4 IMPLANT
SUCTION FRAZIER TIP 10 FR DISP (SUCTIONS) ×2 IMPLANT
SUT VIC AB 0 CT1 27 (SUTURE) ×6
SUT VIC AB 0 CT1 27XBRD ANBCTR (SUTURE) IMPLANT
SUT VIC AB 1 CT1 27 (SUTURE) ×9
SUT VIC AB 1 CT1 27XBRD ANBCTR (SUTURE) ×2 IMPLANT
SUT VIC AB 2-0 CT1 27 (SUTURE) ×6
SUT VIC AB 2-0 CT1 TAPERPNT 27 (SUTURE) ×2 IMPLANT
SUT VIC AB 4-0 PS2 27 (SUTURE) ×1 IMPLANT
TOWEL OR 17X24 6PK STRL BLUE (TOWEL DISPOSABLE) ×3 IMPLANT
TOWEL OR 17X26 10 PK STRL BLUE (TOWEL DISPOSABLE) ×3 IMPLANT
TRAY FOLEY CATH 16FRSI W/METER (SET/KITS/TRAYS/PACK) IMPLANT
WATER STERILE IRR 1000ML POUR (IV SOLUTION) ×4 IMPLANT
WRAP KNEE MAXI GEL POST OP (GAUZE/BANDAGES/DRESSINGS) ×3 IMPLANT

## 2014-04-09 NOTE — Anesthesia Preprocedure Evaluation (Addendum)
Anesthesia Evaluation  Patient identified by MRN, date of birth, ID band Patient awake    Reviewed: Allergy & Precautions, H&P , NPO status , Patient's Chart, lab work & pertinent test results  Airway Mallampati: II      Dental   Pulmonary neg pulmonary ROS,          Cardiovascular hypertension,     Neuro/Psych    GI/Hepatic Neg liver ROS,   Endo/Other  diabetesHypothyroidism   Renal/GU Renal disease     Musculoskeletal   Abdominal   Peds  Hematology   Anesthesia Other Findings   Reproductive/Obstetrics                          Anesthesia Physical Anesthesia Plan  ASA: III  Anesthesia Plan: General   Post-op Pain Management:    Induction: Intravenous  Airway Management Planned: Oral ETT  Additional Equipment:   Intra-op Plan:   Post-operative Plan: Extubation in OR  Informed Consent: I have reviewed the patients History and Physical, chart, labs and discussed the procedure including the risks, benefits and alternatives for the proposed anesthesia with the patient or authorized representative who has indicated his/her understanding and acceptance.   Dental advisory given  Plan Discussed with: Anesthesiologist and CRNA  Anesthesia Plan Comments:         Anesthesia Quick Evaluation

## 2014-04-09 NOTE — Brief Op Note (Signed)
04/09/2014  3:08 PM  PATIENT:  Meredith Shaw  68 y.o. female  PRE-OPERATIVE DIAGNOSIS:  Osteoarthritis left knee  POST-OPERATIVE DIAGNOSIS:  Osteoarthritis left knee  PROCEDURE:  Procedure(s): LEFT TOTAL KNEE ARTHROPLASTY (Left)  SURGEON:  Surgeon(s) and Role:    * Kathryne Hitchhristopher Y Blackman, MD - Primary  ASSISTANTS: Audry Piliyler Mohr, PA S2   ANESTHESIA:   regional and general  EBL:  Total I/O In: 1500 [I.V.:1500] Out: 50 [Blood:50]  BLOOD ADMINISTERED:none  DRAINS: (medium) Hemovact drain(s) in the knee joint with  Suction Open   LOCAL MEDICATIONS USED:  NONE  SPECIMEN:  No Specimen  DISPOSITION OF SPECIMEN:  N/A  COUNTS:  YES  TOURNIQUET:   Total Tourniquet Time Documented: Thigh (Left) - 56 minutes Total: Thigh (Left) - 56 minutes   DICTATION: .Other Dictation: Dictation Number [id number]  PLAN OF CARE: Admit to inpatient   PATIENT DISPOSITION:  PACU - hemodynamically stable.   Delay start of Pharmacological VTE agent (>24hrs) due to surgical blood loss or risk of bleeding: no

## 2014-04-09 NOTE — H&P (Signed)
TOTAL KNEE ADMISSION H&P  Patient is being admitted for left total knee arthroplasty.  Subjective:  Chief Complaint:left knee pain.  HPI: Meredith Shaw, 68 y.o. female, has a history of pain and functional disability in the left knee due to arthritis and has failed non-surgical conservative treatments for greater than 12 weeks to includeNSAID's and/or analgesics, corticosteriod injections, supervised PT with diminished ADL's post treatment, use of assistive devices and activity modification.  Onset of symptoms was gradual, starting 3 years ago with gradually worsening course since that time. The patient noted no past surgery on the left knee(s).  Patient currently rates pain in the left knee(s) at 9 out of 10 with activity. Patient has night pain, worsening of pain with activity and weight bearing, pain that interferes with activities of daily living, pain with passive range of motion, crepitus and joint swelling.  Patient has evidence of subchondral sclerosis, periarticular osteophytes and joint space narrowing by imaging studies. There is no active infection.  Patient Active Problem List   Diagnosis Date Noted  . Arthritis of left knee 04/09/2014  . Arthritis   . Type II or unspecified type diabetes mellitus without mention of complication, not stated as uncontrolled   . Hypertension   . Kidney stones   . Hypothyroidism   . Degenerative arthritis of knee 06/09/2012   Past Medical History  Diagnosis Date  . Arthritis     OA AND PAIN BOTH KNEES -RIGHT KNEE HURTS WORSE THAN LEFT  . Diabetes mellitus     ORAL MEDICATION - NO INSULIN  . Hypertension   . Kidney stones     NONE AT PRESENT TIME THAT PT IS AWARE OF  . Hypothyroidism     Past Surgical History  Procedure Laterality Date  . Percutaneous nephrolithotomy    . Growth removed from thyroid    . Abdominal hysterectomy    . Eye surgery      RIGHT CATARACT EXTRACTON WITH LENS IMPLANT  . Cholecystectomy  1972  . Total knee  arthroplasty  06/09/2012    Procedure: TOTAL KNEE ARTHROPLASTY;  Surgeon: Kathryne Hitchhristopher Y Jacquan Savas, MD;  Location: [facility];  Service: Orthopedics;  Laterality: Right;  Right Total Knee Arthroplasty  . Tubal ligation    . Colonoscopy      Prescriptions prior to admission  Medication Sig Dispense Refill  . cetirizine (ZYRTEC) 10 MG tablet Take 10 mg by mouth daily.      . Cholecalciferol (VITAMIN D3) 5000 UNITS CAPS Take 5,000 mg by mouth daily.      Marland Kitchen. levothyroxine (SYNTHROID, LEVOTHROID) 137 MCG tablet Take 1 tablet (137 mcg total) by mouth daily.  30 tablet  11  . metFORMIN (GLUCOPHAGE) 1000 MG tablet Take 1 tablet (1,000 mg total) by mouth 2 (two) times daily with a meal.  60 tablet  11  . metoprolol (LOPRESSOR) 100 MG tablet Take 1 tablet (100 mg total) by mouth 2 (two) times daily.  60 tablet  11   Allergies  Allergen Reactions  . Penicillins Itching and Rash    History  Substance Use Topics  . Smoking status: Never Smoker   . Smokeless tobacco: Never Used  . Alcohol Use: No    Family History  Problem Relation Age of Onset  . Stroke Father   . Heart disease Father      Review of Systems  Musculoskeletal: Positive for joint pain.  All other systems reviewed and are negative.   Objective:  Physical Exam  Constitutional: She is  oriented to person, place, and time. She appears well-developed and well-nourished.  HENT:  Head: Normocephalic and atraumatic.  Eyes: EOM are normal. Pupils are equal, round, and reactive to light.  Neck: Normal range of motion. Neck supple.  Cardiovascular: Normal rate and regular rhythm.   Respiratory: Effort normal and breath sounds normal.  GI: Soft. Bowel sounds are normal.  Musculoskeletal:       Left knee: She exhibits decreased range of motion and effusion. Tenderness found. Medial joint line and lateral joint line tenderness noted.  Neurological: She is alert and oriented to person, place, and time.  Skin: Skin is warm and dry.   Psychiatric: She has a normal mood and affect.    Vital signs in last 24 hours: Temp:  [97.5 F (36.4 C)] 97.5 F (36.4 C) (06/30 1028) Pulse Rate:  [63] 63 (06/30 1028) Resp:  [18] 18 (06/30 1028) BP: (157)/(81) 157/81 mmHg (06/30 1028) SpO2:  [100 %] 100 % (06/30 1028)  Labs:   Estimated body mass index is 27.68 kg/(m^2) as calculated from the following:   Height as of 04/02/14: 5\' 7"  (1.702 m).   Weight as of 07/03/13: 80.196 kg (176 lb 12.8 oz).   Imaging Review Plain radiographs demonstrate severe degenerative joint disease of the left knee(s). The overall alignment ismild varus. The bone quality appears to be good for age and reported activity level.  Assessment/Plan:  End stage arthritis, left knee   The patient history, physical examination, clinical judgment of the provider and imaging studies are consistent with end stage degenerative joint disease of the left knee(s) and total knee arthroplasty is deemed medically necessary. The treatment options including medical management, injection therapy arthroscopy and arthroplasty were discussed at length. The risks and benefits of total knee arthroplasty were presented and reviewed. The risks due to aseptic loosening, infection, stiffness, patella tracking problems, thromboembolic complications and other imponderables were discussed. The patient acknowledged the explanation, agreed to proceed with the plan and consent was signed. Patient is being admitted for inpatient treatment for surgery, pain control, PT, OT, prophylactic antibiotics, VTE prophylaxis, progressive ambulation and ADL's and discharge planning. The patient is planning to be discharged home with home health services

## 2014-04-09 NOTE — Anesthesia Postprocedure Evaluation (Signed)
  Anesthesia Post-op Note  Patient: Meredith Shaw  Procedure(s) Performed: Procedure(s): LEFT TOTAL KNEE ARTHROPLASTY (Left)  Patient Location: PACU  Anesthesia Type:GA combined with regional for post-op pain  Level of Consciousness: awake, alert , oriented and patient cooperative  Airway and Oxygen Therapy: Patient Spontanous Breathing and Patient connected to nasal cannula oxygen  Post-op Pain: mild  Post-op Assessment: Post-op Vital signs reviewed, Patient's Cardiovascular Status Stable, Respiratory Function Stable, Patent Airway, No signs of Nausea or vomiting and Pain level controlled  Post-op Vital Signs: Reviewed and stable  Last Vitals:  Filed Vitals:   04/09/14 1624  BP:   Pulse: 67  Temp: 36.3 C  Resp: 13    Complications: No apparent anesthesia complications

## 2014-04-09 NOTE — Progress Notes (Signed)
Orthopedic Tech Progress Note Patient Details:  Jan FiremanMarie S Till 08-08-1946 [id number]  CPM Left Knee CPM Left Knee: On Left Knee Flexion (Degrees): 60 Left Knee Extension (Degrees): 0 Additional Comments: applied ohf to bed   Jennye MoccasinHughes, Anthony Craig 04/09/2014, 5:59 PM

## 2014-04-09 NOTE — Transfer of Care (Signed)
Immediate Anesthesia Transfer of Care Note  Patient: Meredith Shaw  Procedure(s) Performed: Procedure(s): LEFT TOTAL KNEE ARTHROPLASTY (Left)  Patient Location: PACU  Anesthesia Type:General  Level of Consciousness: awake, alert , oriented and patient cooperative  Airway & Oxygen Therapy: Patient Spontanous Breathing and Patient connected to nasal cannula oxygen  Post-op Assessment: Report given to PACU RN and Post -op Vital signs reviewed and stable  Post vital signs: Reviewed and stable  Complications: No apparent anesthesia complications

## 2014-04-09 NOTE — Anesthesia Procedure Notes (Addendum)
Procedure Name: LMA Insertion Date/Time: 04/09/2014 1:20 PM Performed by: Angelica PouSMITH, JENNIFER PIZZICARA Pre-anesthesia Checklist: Patient identified, Patient being monitored, Emergency Drugs available, Timeout performed and Suction available Patient Re-evaluated:Patient Re-evaluated prior to inductionOxygen Delivery Method: Circle system utilized Preoxygenation: Pre-oxygenation with 100% oxygen Intubation Type: IV induction LMA: LMA inserted and LMA with gastric port inserted LMA Size: 4.0 Number of attempts: 1 Placement Confirmation: breath sounds checked- equal and bilateral and positive ETCO2 Tube secured with: Tape Dental Injury: Teeth and Oropharynx as per pre-operative assessment     Anesthesia Regional Block:   Narrative:    Anesthesia Regional Block:  Femoral nerve block  Pre-Anesthetic Checklist: ,, timeout performed, Correct Patient, Correct Site, Correct Laterality, Correct Procedure, Correct Position, site marked, Risks and benefits discussed,  Surgical consent,  Pre-op evaluation,  At surgeon's request and post-op pain management  Laterality: Left  Prep: chloraprep       Needles:   Needle Type: Stimulator Needle - 80          Additional Needles:  Procedures: Doppler guided and nerve stimulator Femoral nerve block  Nerve Stimulator or Paresthesia:  Response: 0.5 mA,   Additional Responses:   Narrative:  Start time: 04/09/2014 12:30 PM End time: 04/09/2014 12:45 PM Injection made incrementally with aspirations every 5 mL.  Performed by: Personally  Anesthesiologist: Dr. Randa EvensEdwards

## 2014-04-09 NOTE — Plan of Care (Signed)
Problem: Consults Goal: Diagnosis- Total Joint Replacement Primary Total Knee Left     

## 2014-04-10 ENCOUNTER — Encounter (HOSPITAL_COMMUNITY): Payer: Self-pay | Admitting: Orthopaedic Surgery

## 2014-04-10 LAB — GLUCOSE, CAPILLARY
GLUCOSE-CAPILLARY: 205 mg/dL — AB (ref 70–99)
Glucose-Capillary: 222 mg/dL — ABNORMAL HIGH (ref 70–99)
Glucose-Capillary: 229 mg/dL — ABNORMAL HIGH (ref 70–99)
Glucose-Capillary: 227 mg/dL — ABNORMAL HIGH (ref 70–99)

## 2014-04-10 LAB — CBC
HCT: 32.2 % — ABNORMAL LOW (ref 36.0–46.0)
HEMOGLOBIN: 11 g/dL — AB (ref 12.0–15.0)
MCH: 30.8 pg (ref 26.0–34.0)
MCHC: 34.2 g/dL (ref 30.0–36.0)
MCV: 90.2 fL (ref 78.0–100.0)
Platelets: 201 10*3/uL (ref 150–400)
RBC: 3.57 MIL/uL — AB (ref 3.87–5.11)
RDW: 13.9 % (ref 11.5–15.5)
WBC: 9.2 10*3/uL (ref 4.0–10.5)

## 2014-04-10 LAB — BASIC METABOLIC PANEL
BUN: 25 mg/dL — AB (ref 6–23)
CO2: 23 meq/L (ref 19–32)
CREATININE: 1.3 mg/dL — AB (ref 0.50–1.10)
Calcium: 8.4 mg/dL (ref 8.4–10.5)
Chloride: 103 mEq/L (ref 96–112)
GFR calc Af Amer: 48 mL/min — ABNORMAL LOW (ref >90)
GFR calc non Af Amer: 41 mL/min — ABNORMAL LOW (ref >90)
Glucose, Bld: 225 mg/dL — ABNORMAL HIGH (ref 70–99)
POTASSIUM: 6 meq/L — AB (ref 3.7–5.3)
Sodium: 140 mEq/L (ref 137–147)

## 2014-04-10 NOTE — Progress Notes (Signed)
Occupational Therapy Evaluation and Discharge Patient Details [name] MRN: [id number] DOB: 09/01/1946 Today's Date: 04/10/2014    History of Present Illness Meredith Shaw is a 68 y.o. Female s/p L TKA. Pt with Hx of R TKA in 2013.    Clinical Impression   PTA pt lived at home with her husband and was independent with ADLs and functional mobility. Pt recalled compensatory techniques she utilized during her previous TKA. Education and training completed for fall prevention, energy conservation, and environmental modifications to increase safety and independence with ADLs with pt teach back. Pt requires assist for LB ADLs, however reports that her husband will assist. Pt has no further acute OT needs.     Follow Up Recommendations  No OT follow up;Supervision/Assistance - 24 hour    Equipment Recommendations  3 in 1 bedside comode       Precautions / Restrictions Precautions Precautions: Fall;Knee Precaution Booklet Issued: Yes (comment) Precaution Comments: Educated pt on incorporating precautions into ADLs Restrictions Weight Bearing Restrictions: No LLE Weight Bearing: Weight bearing as tolerated      Mobility Bed Mobility Overal bed mobility: Needs Assistance Bed Mobility: Supine to Sit     Supine to sit: Supervision;HOB elevated     General bed mobility comments: Not assessed; pt sitting in recliner before/after OT session.  Transfers Overall transfer level: Needs assistance Equipment used: Rolling walker (2 wheeled) Transfers: Sit to/from Stand Sit to Stand: Supervision         General transfer comment: Supervision for safety, however good hand placement and setup of RW for transfers.     Balance Overall balance assessment: Needs assistance Sitting-balance support: No upper extremity supported Sitting balance-Leahy Scale: Good     Standing balance support: Single extremity supported Standing balance-Leahy Scale: Poor                               ADL Overall ADL's : Needs assistance/impaired Eating/Feeding: Independent;Sitting   Grooming: Supervision/safety;Standing   Upper Body Bathing: Set up;Sitting   Lower Body Bathing: Minimal assistance;Sit to/from stand   Upper Body Dressing : Set up;Sitting   Lower Body Dressing: Moderate assistance;Sit to/from stand   Toilet Transfer: Supervision/safety;Ambulation;RW;BSC Toilet Transfer Details (indicate cue type and reason): BSC over toilet Toileting- Clothing Manipulation and Hygiene: Supervision/safety;Sit to/from stand   Tub/ Shower Transfer: Walk-in shower;Supervision/safety;Ambulation;3 in 1;Grab bars;Rolling walker   Functional mobility during ADLs: Supervision/safety;Rolling walker General ADL Comments: Pt requires Min/Mod (A) for LB ADLs however reports that her husband will assist until she is able. Pt overall at Supervision level for functional mobility. Educated pt with pt teach back on fall prevention, energy conservation, and environmental modifications for increased independence and safety with ADLs. Pt has no further OT concerns.      Vision  Pt reports no change from baseline.   No apparent visual deficits.                  Perception Perception Perception Tested?: No   Praxis Praxis Praxis tested?: Within functional limits    Pertinent Vitals/Pain NAD, no c/o pain. Pt reports stiffness in R knee.      Hand Dominance Right   Extremity/Trunk Assessment Upper Extremity Assessment Upper Extremity Assessment: Overall WFL for tasks assessed   Lower Extremity Assessment Lower Extremity Assessment: Defer to PT evaluation    Cervical / Trunk Assessment Cervical / Trunk Assessment: Normal   Communication Communication Communication: No difficulties  Cognition Arousal/Alertness: Awake/alert Behavior During Therapy: WFL for tasks assessed/performed Overall Cognitive Status: Within Functional Limits for tasks assessed                                 Home Living Family/patient expects to be discharged to:: Private residence Living Arrangements: Spouse/significant other Available Help at Discharge: Family;Available 24 hours/day Type of Home: House Home Access: Level entry     Home Layout: One level     Bathroom Shower/Tub: Walk-in Pensions consultantshower;Curtain   Bathroom Toilet: Handicapped height Bathroom Accessibility: Yes How Accessible: Accessible via walker Home Equipment: Walker - standard;Grab bars - tub/shower          Prior Functioning/Environment Level of Independence: Independent                                       End of Session Equipment Utilized During Treatment: Gait belt;Rolling walker CPM Left Knee CPM Left Knee: Off  Activity Tolerance: Patient tolerated treatment well Patient left: in chair;with call bell/phone within reach   Time: 1034-1050 OT Time Calculation (min): 16 min Charges:  OT General Charges $OT Visit: 1 Procedure OT Evaluation $Initial OT Evaluation Tier I: 1 Procedure OT Treatments $Self Care/Home Management : 8-22 mins  Rae LipsMiller, Ngoc Detjen M [phone] 04/10/2014, 10:55 AM

## 2014-04-10 NOTE — Op Note (Signed)
NAME:  Meredith Shaw, Meredith Shaw              ACCOUNT NO.:  [id number]  MEDICAL RECORD NO.:  [id number]  LOCATION:  [id number]                        FACILITY:  [facility]  PHYSICIAN:  Vanita PandaChristopher Y. Magnus IvanBlackman, M.D.DATE OF BIRTH:  Aug 12, 1946  DATE OF PROCEDURE:  04/09/2014 DATE OF DISCHARGE:                              OPERATIVE REPORT   PREOPERATIVE DIAGNOSIS:  Severe end-stage arthritis and degenerative joint disease, left knee.  POSTOPERATIVE DIAGNOSIS:  Severe end-stage arthritis and degenerative joint disease, left knee.  PROCEDURE:  Left total knee arthroplasty.  IMPLANTS:  Stryker triathlon knee with size 3 femur, size 3 tibial tray, size 9 mm polyethylene insert, size 29 patellar button.  SURGEON:  Vanita PandaChristopher Y. Magnus IvanBlackman, M.D.  ASSISTANT:  Audry Piliyler Mohr, physician assistant student.  ANESTHESIA:  ANESTHESIA: 1. Left leg femoral nerve block. 2. General.  TOURNIQUET TIME:  60 minutes.  BLOOD LOSS:  Less than 100 mL.  ANTIBIOTICS:  900 mg IV clindamycin.  COMPLICATIONS:  None.  INDICATIONS:  Ms. Lyman BishopLawrence is a 68 year old female with severe debilitating end-stage arthritis involving her left knee.  She is well known to me.  She has had a right total knee arthroplasty 2 years ago and now with the failure of conservative treatment on her left knee. Her daily pain had decreased quality of life and decreased mobility. She wished proceed with a total knee arthroplasty on the left side.  She understands the risk of acute blood loss anemia, nerve and vessel injury, fracture, infection, and DVT.  She understands the goals are decreased pain, improved mobility, and overall improved quality of life.  PROCEDURE IN DETAIL:  After informed consent was obtained, appropriate left leg was marked, anesthesia was obtained through femoral nerve block.  She was then brought to the operating room, placed supine on the operating table.  General anesthesia was then obtained.  A nonsterile tourniquet  placed around her upper left leg, and her left leg was prepped and draped from the thigh down the ankle with DuraPrep and sterile drapes including sterile stockinette.  Time-out was called to identify correct patient, correct left knee.  We then used an Esmarch to wrap out the leg and tourniquet was inflated to 300 mm of pressure.  We then made a midline incision directly over the patella and carried this proximally and distally.  We dissected down the knee joint and performed a medial parapatellar arthrotomy.  Found a large joint effusion in her knee and significant osteophytes.  We inverted the patella and flexed the knee, removed debris from the knee including loose bodies and remnants from the medial and lateral meniscus, remnants of ACL, PCL, and osteophytes from throughout.  We then addressed the tibia 1st using an extramedullary cutting guide for the tibia, taking out for 0 slope and taking about 9 mm off the high side.  We made our tibial cut.  We then turned attention to the femur.  We used the intramedullary guide for the femur and made our distal femoral cut based off of 5 degrees left and a 10 mm cut.  We made this resection as well and then brought the knee back down in extension.  We placed a 9 mm extension block and I  felt I needed to take 2 more mm off the tibia which we did.  I was then please with her extension gap with those 2 cuts.  We went back to the femur, we put the femoral sizing guide based on the epicondylar axis and chose a size 3 femur with a 4 and 1 cutting block on the femur and made our anterior and posterior cuts followed by our chamfer cuts.  We then made our femoral box cut.  We then back to the tibia and trialed a size for a size 3 tibia.  We made our tibial keel cut and then with the trial tibia and trial size 3 femur, we trialed a 9 mm polyethylene insert.  I was pleased with the range of motion and  stability with that.  We then made our patellar cut  and trialed for size 29 patella after making holes for this.  With all trial components in place, I was pleased with stability and the motion.  I then removed all trial components and copiously irrigated the knee with normal saline solution using pulsatile lavage. We then dried the knee and removed debris from the knee.  We then cemented the real Stryker triathlon fixed bearing tibial tray, size 3, followed by the real size 3 femur.  We removed cement debris and then placed the real 9 mm fix bearing polyethylene insert.  We then cemented the patellar button.  Once all the cement had dried, we let the tourniquet down.  Hemostasis was obtained with electrocautery.  We irrigated the knee again with normal saline solution using pulsatile lavage.  We then placed a medium Hemovac in the arthrotomy and closed the arthrotomy with interrupted #1 Vicryl suture followed by 0 Vicryl in the deep tissue, 2-0 Vicryl in subcutaneous tissue, and interrupted staples on the skin.  Xeroform and well-padded sterile dressing was applied.  All drapes were removed.  She was awakened, extubated, and taken to recovery room stable condition.  All final counts were correct. There were no complications noted.     Vanita Pandahristopher Y. Magnus IvanBlackman, M.D.     CYB/MEDQ  D:  04/09/2014  T:  04/10/2014  Job:  [id number]

## 2014-04-10 NOTE — Care Management Note (Signed)
CARE MANAGEMENT NOTE 04/10/2014  Patient:  Meredith Shaw,Meredith Shaw   Account Number:  [id number]  Date Initiated:  04/10/2014  Documentation initiated by:  Vance PeperBRADY,Infinity Jeffords  Subjective/Objective Assessment:   68 yr old female Shaw/p left total knee arthroplasty.     Action/Plan:   Case manager spoke with patient concerning home heath and DME needs at discharge. Choice offered. Patient states she has used [facility] in the past, will do so now. [facility]. Patient has RW and 3in1.   Anticipated DC Date:  04/11/2014   Anticipated DC Plan:  HOME W HOME HEALTH SERVICES      DC Planning Services  CM consult      [facility]  [facility]  DURABLE MEDICAL EQUIPMENT   Choice offered to / List presented to:  C-1 Patient   DME arranged  CPM      DME agency  [employer]     HH arranged  HH-2 PT      HH agency  [facility]   Status of service:  Completed, signed off

## 2014-04-10 NOTE — Progress Notes (Signed)
Physical Therapy Treatment Patient Details [name] Pote MRN: [id number] DOB: May 19, 1946 Today'Shaw Date: 04/10/2014    History of Present Illness Meredith Shaw. Corzine is a 68 y.o. Female Shaw/p L TKA. Pt with Hx of R TKA in 2013.     PT Comments    Patient motivated and cooperative with therapy. Increased ambulation distance this PM with 1 episode of nausea towards end of session, (-) emesis. Continues to require VC for safety with RW during ambulation/transfers. Provided HEP handout and educated pt on knee precautions/safety. CPM donned in bed post treatment. Pt would benefit from continued skilled therapy to maximize independence prior to discharge.   Follow Up Recommendations  Home health PT;Supervision/Assistance - 24 hour     Equipment Recommendations  Rolling walker with 5" wheels;3in1 (PT)    Recommendations for Other Services       Precautions / Restrictions Precautions Precautions: Knee;Fall Precaution Booklet Issued: Yes (comment) Precaution Comments: Administered HEP/knee precautions handout. Restrictions Weight Bearing Restrictions: No LLE Weight Bearing: Weight bearing as tolerated    Mobility  Bed Mobility               General bed mobility comments: Sitting in recliner upon PT arrival.  Transfers Overall transfer level: Needs assistance Equipment used: Rolling walker (2 wheeled) Transfers: Sit to/from Stand Sit to Stand: Supervision         General transfer comment: Supervision for safety and IV management. Transferred on/off raised toilet seat with supervision. Transferred to EOB with cues for safety.  Ambulation/Gait Ambulation/Gait assistance: Min guard Ambulation Distance (Feet): 140 Feet Assistive device: Rolling walker (2 wheeled) Gait Pattern/deviations: Step-to pattern;Decreased stance time - left   Gait velocity interpretation: Below normal speed for age/gender General Gait Details: Cues to increase knee flexion during swing phase  of gait on LLE for more normalized gait pattern; VC for gait sequencing and RW managment as pt with tendency to pick up RW.   Stairs            Wheelchair Mobility    Modified Rankin (Stroke Patients Only)       Balance Overall balance assessment: Needs assistance Sitting-balance support: No upper extremity supported Sitting balance-Leahy Scale: Good     Standing balance support: Bilateral upper extremity supported;During functional activity Standing balance-Leahy Scale: Poor                      Cognition Arousal/Alertness: Awake/alert Behavior During Therapy: WFL for tasks assessed/performed Overall Cognitive Status: Within Functional Limits for tasks assessed                      Exercises Total Joint Exercises Ankle Circles/Pumps: Both;15 reps Quad Sets: Left;10 reps;Seated (3-5 sec hold) Long Arc Quad: Left;15 reps (Required cues to reduce compensatory technique - pt with tendency to utilize trunk lean posteriorly during exercise.) Knee Flexion: 10 reps;Left (Holding for 30 sec at end range.)    General Comments        Pertinent Vitals/Pain 5/10 pain in L knee. Pt requesting pain medication post treatment and RN, Chari ManningBecca, made aware.    Home Living                      Prior Function            PT Goals (current goals can now be found in the care plan section) Progress towards PT goals: Progressing toward goals    Frequency  7X/week  PT Plan Current plan remains appropriate    Co-evaluation             End of Session Equipment Utilized During Treatment: Gait belt Activity Tolerance: Patient tolerated treatment well;Other (comment) ((+) nausea present towards end of treatment session. (-) emesis. Resolved.) Patient left: in bed;with call bell/phone within reach;with bed alarm set;with family/visitor present     Time: 1225-1313 PT Time Calculation (min): 48 min  Charges:  $Gait Training: 8-22 mins $Therapeutic  Exercise: 8-22 mins $Self Care/Home Management: 8-22                    G CodesAlvie Heidelberg:      Folan, Dorian Duval A 04/10/2014, 2:04 PM Alvie HeidelbergShauna Folan, PT, DPT [phone]

## 2014-04-10 NOTE — Progress Notes (Signed)
Subjective: 1 Day Post-Op Procedure(s) (LRB): LEFT TOTAL KNEE ARTHROPLASTY (Left) Patient reports pain as moderate.  Has been in CPM.  Objective: Vital signs in last 24 hours: Temp:  [97.4 F (36.3 C)-97.7 F (36.5 C)] 97.7 F (36.5 C) (07/01 0445) Pulse Rate:  [52-81] 63 (07/01 0445) Resp:  [8-18] 16 (07/01 0445) BP: (130-167)/(66-95) 135/81 mmHg (07/01 0445) SpO2:  [93 %-100 %] 100 % (07/01 0445)  Intake/Output from previous day: 06/30 0701 - 07/01 0700 In: 2328.8 [P.O.:280; I.V.:2048.8] Out: 460 [Urine:200; Drains:210; Blood:50] Intake/Output this shift:     Recent Labs  04/10/14 0600  HGB 11.0*    Recent Labs  04/10/14 0600  WBC 9.2  RBC 3.57*  HCT 32.2*  PLT 201    Recent Labs  04/10/14 0600  NA 140  K 6.0*  CL 103  CO2 23  BUN 25*  CREATININE 1.30*  GLUCOSE 225*  CALCIUM 8.4   No results found for this basename: LABPT, INR,  in the last 72 hours  Intact pulses distally Dorsiflexion/Plantar flexion intact Incision: dressing C/D/I Compartment soft  Assessment/Plan: 1 Day Post-Op Procedure(s) (LRB): LEFT TOTAL KNEE ARTHROPLASTY (Left) Up with therapy  Tamberly Pomplun Y 04/10/2014, 7:08 AM

## 2014-04-10 NOTE — Evaluation (Signed)
Physical Therapy Evaluation Patient Details [name] MRN: [id number] DOB: 1946/07/19 Today's Date: 04/10/2014   History of Present Illness  pt presents with L TKA.    Clinical Impression  Pt moving great and very motivated.  Pt will need RW and 3-in-1 at D/C.  Will continue to follow.      Follow Up Recommendations Home health PT;Supervision/Assistance - 24 hour    Equipment Recommendations  Rolling walker with 5" wheels;3in1 (PT)    Recommendations for Other Services       Precautions / Restrictions Precautions Precautions: Fall;Knee Precaution Booklet Issued: Yes (comment) Restrictions Weight Bearing Restrictions: Yes LLE Weight Bearing: Weight bearing as tolerated      Mobility  Bed Mobility Overal bed mobility: Needs Assistance Bed Mobility: Supine to Sit     Supine to sit: Supervision;HOB elevated     General bed mobility comments: pt utilizes bed rails to bring trunk up to sitting.  pt states after her last TKA she slept in lift chair initially.    Transfers Overall transfer level: Needs assistance Equipment used: Rolling walker (2 wheeled) Transfers: Sit to/from Stand Sit to Stand: Min guard         General transfer comment: Demos good use of UEs.    Ambulation/Gait Ambulation/Gait assistance: Min guard Ambulation Distance (Feet): 100 Feet Assistive device: Rolling walker (2 wheeled) Gait Pattern/deviations: Step-to pattern;Decreased step length - right;Decreased stance time - left     General Gait Details: cues for gait sequencing and use of RW.    Stairs            Wheelchair Mobility    Modified Rankin (Stroke Patients Only)       Balance Overall balance assessment: Needs assistance Sitting-balance support: No upper extremity supported Sitting balance-Leahy Scale: Good     Standing balance support: Single extremity supported Standing balance-Leahy Scale: Poor                               Pertinent  Vitals/Pain 3-4/10 Premedicated.      Home Living Family/patient expects to be discharged to:: Private residence Living Arrangements: Spouse/significant other Available Help at Discharge: Family;Available 24 hours/day Type of Home: House Home Access: Level entry     Home Layout: One level Home Equipment: Walker - standard      Prior Function Level of Independence: Independent               Hand Dominance        Extremity/Trunk Assessment   Upper Extremity Assessment: Defer to OT evaluation           Lower Extremity Assessment: LLE deficits/detail   LLE Deficits / Details: Limited by post-op pain.  pt able to perform leg lifts, however with extensor lag.    Cervical / Trunk Assessment: Normal  Communication   Communication: No difficulties  Cognition Arousal/Alertness: Awake/alert Behavior During Therapy: WFL for tasks assessed/performed Overall Cognitive Status: Within Functional Limits for tasks assessed                      General Comments      Exercises Total Joint Exercises Ankle Circles/Pumps: AROM;Both;10 reps Straight Leg Raises: AROM;Left;5 reps Long Arc Quad: AROM;Left;10 reps Knee Flexion: AROM;Left;10 reps Goniometric ROM: AROM ~ 10 - 65      Assessment/Plan    PT Assessment Patient needs continued PT services  PT Diagnosis Abnormality of gait;Acute pain  PT Problem List Decreased strength;Decreased balance;Decreased mobility;Decreased knowledge of use of DME;Decreased range of motion;Decreased activity tolerance;Pain  PT Treatment Interventions DME instruction;Gait training;Stair training;Functional mobility training;Therapeutic activities;Therapeutic exercise;Balance training;Patient/family education   PT Goals (Current goals can be found in the Care Plan section) Acute Rehab PT Goals Patient Stated Goal: Back to work PT Goal Formulation: With patient Time For Goal Achievement: 04/17/14 Potential to Achieve Goals: Good     Frequency 7X/week   Barriers to discharge        Co-evaluation               End of Session Equipment Utilized During Treatment: Gait belt Activity Tolerance: Patient tolerated treatment well Patient left: in chair;with call bell/phone within reach Nurse Communication: Mobility status         Time: [id number] PT Time Calculation (min): 31 min   Charges:   PT Evaluation $Initial PT Evaluation Tier I: 1 Procedure PT Treatments $Gait Training: 8-22 mins $Therapeutic Exercise: 8-22 mins   PT G CodesSunny Schlein:          Meredith Shaw F, [place]PT [phone] 04/10/2014, 9:18 AM

## 2014-04-11 LAB — CBC
HCT: 28.5 % — ABNORMAL LOW (ref 36.0–46.0)
Hemoglobin: 9.6 g/dL — ABNORMAL LOW (ref 12.0–15.0)
MCH: 30 pg (ref 26.0–34.0)
MCHC: 33.7 g/dL (ref 30.0–36.0)
MCV: 89.1 fL (ref 78.0–100.0)
Platelets: 177 10*3/uL (ref 150–400)
RBC: 3.2 MIL/uL — ABNORMAL LOW (ref 3.87–5.11)
RDW: 13.9 % (ref 11.5–15.5)
WBC: 10.3 10*3/uL (ref 4.0–10.5)

## 2014-04-11 LAB — GLUCOSE, CAPILLARY: Glucose-Capillary: 244 mg/dL — ABNORMAL HIGH (ref 70–99)

## 2014-04-11 MED ORDER — ASPIRIN 325 MG PO TBEC: 325.0000 mg | DELAYED_RELEASE_TABLET | Freq: Two times a day (BID) | ORAL | Status: DC

## 2014-04-11 MED ORDER — OXYCODONE-ACETAMINOPHEN 5-325 MG PO TABS: 1.0000 | ORAL_TABLET | ORAL | Status: DC | PRN

## 2014-04-11 NOTE — Discharge Summary (Signed)
Patient ID: Meredith Shaw MRN: [id number] DOB/AGE: 68-Sep-1947 68 y.o.  Admit date: 04/09/2014 Discharge date: 04/11/2014  Admission Diagnoses:  Principal Problem:   Arthritis of left knee Active Problems:   Status post total knee replacement   Discharge Diagnoses:  Same  Past Medical History  Diagnosis Date  . Arthritis     OA AND PAIN BOTH KNEES -RIGHT KNEE HURTS WORSE THAN LEFT  . Diabetes mellitus     ORAL MEDICATION - NO INSULIN  . Hypertension   . Kidney stones     NONE AT PRESENT TIME THAT PT IS AWARE OF  . Hypothyroidism     Surgeries: Procedure(s): LEFT TOTAL KNEE ARTHROPLASTY on 04/09/2014   Consultants:    Discharged Condition: Improved  Hospital Course: Meredith FiremanMarie S Hobbs is an 68 y.o. female who was admitted 04/09/2014 for operative treatment ofArthritis of left knee. Patient has severe unremitting pain that affects sleep, daily activities, and work/hobbies. After pre-op clearance the patient was taken to the operating room on 04/09/2014 and underwent  Procedure(s): LEFT TOTAL KNEE ARTHROPLASTY.    Patient was given perioperative antibiotics: Anti-infectives   Start     Dose/Rate Route Frequency Ordered Stop   04/09/14 1700  clindamycin (CLEOCIN) IVPB 600 mg     600 mg 100 mL/hr over 30 Minutes Intravenous Every 6 hours 04/09/14 1649 04/09/14 2322   04/09/14 0600  clindamycin (CLEOCIN) IVPB 900 mg     900 mg 100 mL/hr over 30 Minutes Intravenous On call to O.R. 04/08/14 1419 04/09/14 1312       Patient was given sequential compression devices, early ambulation, and chemoprophylaxis to prevent DVT.  Patient benefited maximally from hospital stay and there were no complications.    Recent vital signs: Patient Vitals for the past 24 hrs:  BP Temp Temp src Pulse Resp SpO2 Height Weight  04/11/14 0503 145/65 mmHg 98.1 F (36.7 C) Oral 72 16 100 % - -  04/11/14 0400 - - - - 18 - - -  04/11/14 0220 - - - - - - 5' 7.01" (1.702 m) 81.194 kg (179 lb)   04/11/14 0000 - - - - 17 - - -  04/10/14 2020 123/63 mmHg 98.1 F (36.7 C) Oral 70 16 100 % - -  04/10/14 2000 - - - - 18 - - -  04/10/14 1305 107/62 mmHg 97.9 F (36.6 C) - 63 - 100 % - -     Recent laboratory studies:  Recent Labs  04/10/14 0600 04/11/14 0400  WBC 9.2 10.3  HGB 11.0* 9.6*  HCT 32.2* 28.5*  PLT 201 177  NA 140  --   K 6.0*  --   CL 103  --   CO2 23  --   BUN 25*  --   CREATININE 1.30*  --   GLUCOSE 225*  --   CALCIUM 8.4  --      Discharge Medications:     Medication List         aspirin 325 MG EC tablet  Take 1 tablet (325 mg total) by mouth 2 (two) times daily after a meal.     cetirizine 10 MG tablet  Commonly known as:  ZYRTEC  Take 10 mg by mouth daily.     levothyroxine 137 MCG tablet  Commonly known as:  SYNTHROID, LEVOTHROID  Take 1 tablet (137 mcg total) by mouth daily.     metFORMIN 1000 MG tablet  Commonly known as:  GLUCOPHAGE  Take 1 tablet (  1,000 mg total) by mouth 2 (two) times daily with a meal.     metoprolol 100 MG tablet  Commonly known as:  LOPRESSOR  Take 1 tablet (100 mg total) by mouth 2 (two) times daily.     oxyCODONE-acetaminophen 5-325 MG per tablet  Commonly known as:  ROXICET  Take 1-2 tablets by mouth every 4 (four) hours as needed for severe pain.     Vitamin D3 5000 UNITS Caps  Take 5,000 mg by mouth daily.        Diagnostic Studies: Dg Chest 2 View  04/02/2014   CLINICAL DATA:  Hypertension.  EXAM: CHEST  2 VIEW  COMPARISON:  June 05, 2012.  FINDINGS: The heart size and mediastinal contours are within normal limits. Both lungs are clear. No pneumothorax or pleural effusion is noted. The visualized skeletal structures are unremarkable.  IMPRESSION: No acute cardiopulmonary abnormality seen.   Electronically Signed   By: Roque Lias M.D.   On: 04/02/2014 15:50   Dg Knee Left Port  04/09/2014   CLINICAL DATA:  post-op total knee; in pacu  EXAM: PORTABLE LEFT KNEE - 1-2 VIEW  COMPARISON:  None.   FINDINGS: Sequelae of recent left total knee arthroplasty in place. The distal femoral and proximal tibial components appear or well seated and articulate normally with 1 another. Skin staples overlie the knee. Scattered soft tissue emphysema compatible with recent surgery. Surgical drain in place. No periprosthetic fracture or other complication.  IMPRESSION: Sequelae of recent left total knee arthroplasty without complication.   Electronically Signed   By: Rise Mu M.D.   On: 04/09/2014 15:51    Disposition: 01-Home or Self Care      Discharge Instructions   Call MD / Call 911    Complete by:  As directed   If you experience chest pain or shortness of breath, CALL 911 and be transported to the hospital emergency room.  If you develope a fever above 101 F, pus (white drainage) or increased drainage or redness at the wound, or calf pain, call your surgeon's office.     Constipation Prevention    Complete by:  As directed   Drink plenty of fluids.  Prune juice may be helpful.  You may use a stool softener, such as Colace (over the counter) 100 mg twice a day.  Use MiraLax (over the counter) for constipation as needed.     Diet - low sodium heart healthy    Complete by:  As directed      Discharge instructions    Complete by:  As directed   Increase activities as comfort allows. Work on bending your knee. Ice and elevation for swelling. Pump your feet several times during the day for circulation. You can get your current dressing wet in the shower. You can start getting your actual incision wet in the shower starting 04/15/14; then new dry dressing daily     Discharge patient    Complete by:  As directed      Increase activity slowly as tolerated    Complete by:  As directed            Follow-up Information   Follow up with [facility]. (Someone from [facility] will contact you concerning start date and time for physical therapy.)    Contact  information:   [address] [place] [address] [phone]       Follow up with Kathryne Hitch, MD In 2 weeks.  Specialty:  Orthopedic Surgery   Contact information:   [address] [place] [address] [phone]        Signed: Kathryne HitchBLACKMAN,Eugean Arnott Y 04/11/2014, 7:05 AM

## 2014-04-11 NOTE — Progress Notes (Signed)
Physical Therapy Treatment Patient Details Name: Jan FiremanMarie S Gaby MRN: [id number] DOB: 1945/12/13 Today's Date: 04/11/2014    History of Present Illness Jan FiremanMarie S. Grunden is a 68 y.o. Female s/p L TKA. Pt with Hx of R TKA in 2013.     PT Comments    Pt moving well today and at most required only guarding during mobility.  Pt able to perform 3-in-1 transfer without A and performed own peri care.  Pt ready for D/C from PT stand point.  Will continue to follow while on acute.    Follow Up Recommendations  Home health PT;Supervision/Assistance - 24 hour     Equipment Recommendations  Rolling walker with 5" wheels;3in1 (PT)    Recommendations for Other Services       Precautions / Restrictions Precautions Precautions: Knee;Fall Restrictions Weight Bearing Restrictions: Yes LLE Weight Bearing: Weight bearing as tolerated    Mobility  Bed Mobility Overal bed mobility: Needs Assistance Bed Mobility: Supine to Sit     Supine to sit: Min assist     General bed mobility comments: pt needs A with L LE only.    Transfers Overall transfer level: Needs assistance Equipment used: Rolling walker (2 wheeled) Transfers: Sit to/from Stand Sit to Stand: Supervision            Ambulation/Gait Ambulation/Gait assistance: Min guard Ambulation Distance (Feet): 150 Feet Assistive device: Rolling walker (2 wheeled) Gait Pattern/deviations: Step-through pattern;Decreased step length - right;Decreased stance time - left;Decreased stride length     General Gait Details: pt with one instance of L knee instability 2/2 to pain, however pt able to maintain balance with use of UEs on RW.     Stairs            Wheelchair Mobility    Modified Rankin (Stroke Patients Only)       Balance                                    Cognition Arousal/Alertness: Awake/alert Behavior During Therapy: WFL for tasks assessed/performed Overall Cognitive Status: Within  Functional Limits for tasks assessed                      Exercises Total Joint Exercises Long Arc Quad: AROM;Left;10 reps Knee Flexion: AROM;Left;10 reps Goniometric ROM: 10 - 75    General Comments        Pertinent Vitals/Pain 6/10.  RN gave pain meds.    Home Living                      Prior Function            PT Goals (current goals can now be found in the care plan section) Acute Rehab PT Goals Time For Goal Achievement: 04/17/14 Potential to Achieve Goals: Good Progress towards PT goals: Progressing toward goals    Frequency  7X/week    PT Plan Current plan remains appropriate    Co-evaluation             End of Session   Activity Tolerance: Patient tolerated treatment well Patient left: in chair;with call bell/phone within reach     Time: 0800-0841 PT Time Calculation (min): 41 min  Charges:  $Gait Training: 23-37 mins $Therapeutic Exercise: 8-22 mins                    G Codes:  Sunny SchleinRitenour, Agastya Meister F, [place] CarolinaPT [phone] 04/11/2014, 9:02 AM

## 2014-04-11 NOTE — Progress Notes (Signed)
Subjective: 2 Days Post-Op Procedure(s) (LRB): LEFT TOTAL KNEE ARTHROPLASTY (Left) Patient reports pain as mild.  Working well with therapy.  Asymptomatic acute blood loss anemia.  Objective: Vital signs in last 24 hours: Temp:  [97.9 F (36.6 C)-98.1 F (36.7 C)] 98.1 F (36.7 C) (07/02 0503) Pulse Rate:  [63-72] 72 (07/02 0503) Resp:  [16-18] 16 (07/02 0503) BP: (107-145)/(62-65) 145/65 mmHg (07/02 0503) SpO2:  [100 %] 100 % (07/02 0503) Weight:  [81.194 kg (179 lb)] 81.194 kg (179 lb) (07/02 0220)  Intake/Output from previous day: 07/01 0701 - 07/02 0700 In: 720 [P.O.:720] Out: -  Intake/Output this shift:     Recent Labs  04/10/14 0600 04/11/14 0400  HGB 11.0* 9.6*    Recent Labs  04/10/14 0600 04/11/14 0400  WBC 9.2 10.3  RBC 3.57* 3.20*  HCT 32.2* 28.5*  PLT 201 177    Recent Labs  04/10/14 0600  NA 140  K 6.0*  CL 103  CO2 23  BUN 25*  CREATININE 1.30*  GLUCOSE 225*  CALCIUM 8.4   No results found for this basename: LABPT, INR,  in the last 72 hours  Sensation intact distally Intact pulses distally Dorsiflexion/Plantar flexion intact Incision: no drainage No cellulitis present Compartment soft  Assessment/Plan: 2 Days Post-Op Procedure(s) (LRB): LEFT TOTAL KNEE ARTHROPLASTY (Left) Discharge home with home health  BLACKMAN,CHRISTOPHER Y 04/11/2014, 7:01 AM

## 2014-05-01 ENCOUNTER — Ambulatory Visit: Payer: BC Managed Care – PPO | Attending: Orthopaedic Surgery | Admitting: Physical Therapy

## 2014-05-01 DIAGNOSIS — M25669 Stiffness of unspecified knee, not elsewhere classified: Secondary | ICD-10-CM | POA: Diagnosis not present

## 2014-05-01 DIAGNOSIS — IMO0001 Reserved for inherently not codable concepts without codable children: Secondary | ICD-10-CM | POA: Insufficient documentation

## 2014-05-01 DIAGNOSIS — R5381 Other malaise: Secondary | ICD-10-CM | POA: Diagnosis not present

## 2014-05-01 DIAGNOSIS — E119 Type 2 diabetes mellitus without complications: Secondary | ICD-10-CM | POA: Insufficient documentation

## 2014-05-01 DIAGNOSIS — R269 Unspecified abnormalities of gait and mobility: Secondary | ICD-10-CM | POA: Diagnosis not present

## 2014-05-01 DIAGNOSIS — Z96659 Presence of unspecified artificial knee joint: Secondary | ICD-10-CM | POA: Diagnosis not present

## 2014-05-01 DIAGNOSIS — M25569 Pain in unspecified knee: Secondary | ICD-10-CM | POA: Insufficient documentation

## 2014-05-01 DIAGNOSIS — I10 Essential (primary) hypertension: Secondary | ICD-10-CM | POA: Insufficient documentation

## 2014-05-03 ENCOUNTER — Ambulatory Visit: Payer: BC Managed Care – PPO | Admitting: *Deleted

## 2014-05-03 DIAGNOSIS — IMO0001 Reserved for inherently not codable concepts without codable children: Secondary | ICD-10-CM | POA: Diagnosis not present

## 2014-05-07 ENCOUNTER — Ambulatory Visit: Payer: BC Managed Care – PPO | Admitting: Physical Therapy

## 2014-05-07 DIAGNOSIS — IMO0001 Reserved for inherently not codable concepts without codable children: Secondary | ICD-10-CM | POA: Diagnosis not present

## 2014-05-09 ENCOUNTER — Ambulatory Visit: Payer: BC Managed Care – PPO | Admitting: Physical Therapy

## 2014-05-09 DIAGNOSIS — IMO0001 Reserved for inherently not codable concepts without codable children: Secondary | ICD-10-CM | POA: Diagnosis not present

## 2014-05-14 ENCOUNTER — Ambulatory Visit: Payer: BC Managed Care – PPO | Attending: Orthopaedic Surgery | Admitting: Physical Therapy

## 2014-05-14 DIAGNOSIS — M25569 Pain in unspecified knee: Secondary | ICD-10-CM | POA: Insufficient documentation

## 2014-05-14 DIAGNOSIS — I10 Essential (primary) hypertension: Secondary | ICD-10-CM | POA: Insufficient documentation

## 2014-05-14 DIAGNOSIS — E119 Type 2 diabetes mellitus without complications: Secondary | ICD-10-CM | POA: Diagnosis not present

## 2014-05-14 DIAGNOSIS — Z96659 Presence of unspecified artificial knee joint: Secondary | ICD-10-CM | POA: Diagnosis not present

## 2014-05-14 DIAGNOSIS — R269 Unspecified abnormalities of gait and mobility: Secondary | ICD-10-CM | POA: Insufficient documentation

## 2014-05-14 DIAGNOSIS — M25669 Stiffness of unspecified knee, not elsewhere classified: Secondary | ICD-10-CM | POA: Insufficient documentation

## 2014-05-14 DIAGNOSIS — IMO0001 Reserved for inherently not codable concepts without codable children: Secondary | ICD-10-CM | POA: Diagnosis not present

## 2014-05-14 DIAGNOSIS — R5381 Other malaise: Secondary | ICD-10-CM | POA: Diagnosis not present

## 2014-05-16 ENCOUNTER — Ambulatory Visit: Payer: BC Managed Care – PPO | Admitting: Physical Therapy

## 2014-05-16 DIAGNOSIS — IMO0001 Reserved for inherently not codable concepts without codable children: Secondary | ICD-10-CM | POA: Diagnosis not present

## 2014-05-17 ENCOUNTER — Encounter: Payer: Self-pay | Admitting: Family Medicine

## 2014-05-17 ENCOUNTER — Ambulatory Visit (INDEPENDENT_AMBULATORY_CARE_PROVIDER_SITE_OTHER): Payer: BC Managed Care – PPO | Admitting: Family Medicine

## 2014-05-17 VITALS — BP 140/76 | HR 62 | Temp 97.6°F | Ht 67.0 in | Wt 172.0 lb

## 2014-05-17 DIAGNOSIS — I10 Essential (primary) hypertension: Secondary | ICD-10-CM

## 2014-05-17 DIAGNOSIS — E039 Hypothyroidism, unspecified: Secondary | ICD-10-CM

## 2014-05-17 DIAGNOSIS — E119 Type 2 diabetes mellitus without complications: Secondary | ICD-10-CM | POA: Diagnosis not present

## 2014-05-17 LAB — POCT GLYCOSYLATED HEMOGLOBIN (HGB A1C): HEMOGLOBIN A1C: 7

## 2014-05-17 NOTE — Progress Notes (Signed)
   Subjective:    Patient ID: Meredith Shaw, female    DOB: 1945-11-01, 68 y.o.   MRN: [id number]  HPI 68 year old female here to followup diabetes. She had her second knee replacement done on June 30 and is currently undergoing physical therapy. She does monitor her sugars at home and generally those of been in the 120 range on random checking. She takes metformin 1000 mg twice a day with no side effects. She is also on lisinopril 20 mg She asked about her thyroid. On review of her chart TSH was checked 2 months ago and was within normal limits but since she's here with will count this is her yearly followup hypothyroid, status post thyroidectomy    Review of Systems  Constitutional: Negative.   HENT: Negative.   Eyes: Negative.   Respiratory: Negative.   Cardiovascular: Negative.   Gastrointestinal: Negative.   Endocrine: Negative.   Genitourinary: Negative.   Musculoskeletal: Positive for myalgias.       Bilateral thumb pain  Skin: Rash: not true rash but healing intertrigo.  Hematological: Negative.   Psychiatric/Behavioral: Negative.        Objective:   Physical Exam  Constitutional: She is oriented to person, place, and time. She appears well-developed and well-nourished.  Eyes: Conjunctivae and EOM are normal.  Neck: Normal range of motion. Neck supple.  Cardiovascular: Normal rate, regular rhythm and normal heart sounds.   Pulmonary/Chest: Effort normal and breath sounds normal.  Abdominal: Soft. Bowel sounds are normal.  Musculoskeletal: Normal range of motion.  Neurological: She is alert and oriented to person, place, and time. She has normal reflexes.  Skin: Skin is warm and dry.  Psychiatric: She has a normal mood and affect. Her behavior is normal. Thought content normal.   BP 140/76  Pulse 62  Temp(Src) 97.6 F (36.4 C) (Oral)  Ht 5\' 7"  (1.702 m)  Wt 172 lb (78.019 kg)  BMI 26.93 kg/m2        Assessment & Plan:  1. Essential hypertension Continue  ACE and beta blpcker  2. Type II or unspecified type diabetes mellitus without mention of complication, not stated as uncontrolled A1C was elevated post hospital check.  Not checked here in 10 months - POCT glycosylated hemoglobin (Hb A1C)  3. Unspecified hypothyroidism  - TSH   Meredith KusterStephen M Pascal Stiggers MD

## 2014-05-17 NOTE — Patient Instructions (Signed)

## 2014-05-18 LAB — TSH: TSH: 4.25 u[IU]/mL (ref 0.450–4.500)

## 2014-05-21 ENCOUNTER — Telehealth: Payer: Self-pay | Admitting: Family Medicine

## 2014-05-21 ENCOUNTER — Ambulatory Visit: Payer: BC Managed Care – PPO | Admitting: Physical Therapy

## 2014-05-21 DIAGNOSIS — IMO0001 Reserved for inherently not codable concepts without codable children: Secondary | ICD-10-CM | POA: Diagnosis not present

## 2014-05-21 NOTE — Telephone Encounter (Signed)
Message copied by Azalee CourseFULP, ASHLEY on Tue May 21, 2014 10:09 AM ------      Message from: Frederica KusterMILLER, STEPHEN M      Created: Mon May 20, 2014  7:10 AM       TSH is good; stay on same dose thyroid ------

## 2014-05-23 ENCOUNTER — Ambulatory Visit: Payer: BC Managed Care – PPO | Admitting: Physical Therapy

## 2014-05-23 DIAGNOSIS — IMO0001 Reserved for inherently not codable concepts without codable children: Secondary | ICD-10-CM | POA: Diagnosis not present

## 2014-05-28 ENCOUNTER — Ambulatory Visit: Payer: BC Managed Care – PPO | Admitting: *Deleted

## 2014-05-28 DIAGNOSIS — IMO0001 Reserved for inherently not codable concepts without codable children: Secondary | ICD-10-CM | POA: Diagnosis not present

## 2014-05-30 ENCOUNTER — Ambulatory Visit: Payer: BC Managed Care – PPO | Admitting: *Deleted

## 2014-05-30 DIAGNOSIS — IMO0001 Reserved for inherently not codable concepts without codable children: Secondary | ICD-10-CM | POA: Diagnosis not present

## 2014-06-04 ENCOUNTER — Ambulatory Visit: Payer: BC Managed Care – PPO | Admitting: Physical Therapy

## 2014-06-04 DIAGNOSIS — IMO0001 Reserved for inherently not codable concepts without codable children: Secondary | ICD-10-CM | POA: Diagnosis not present

## 2014-06-06 ENCOUNTER — Ambulatory Visit: Payer: BC Managed Care – PPO | Admitting: Physical Therapy

## 2014-06-06 DIAGNOSIS — IMO0001 Reserved for inherently not codable concepts without codable children: Secondary | ICD-10-CM | POA: Diagnosis not present

## 2014-06-11 ENCOUNTER — Ambulatory Visit: Payer: BC Managed Care – PPO | Attending: Orthopaedic Surgery | Admitting: Physical Therapy

## 2014-06-11 DIAGNOSIS — M25569 Pain in unspecified knee: Secondary | ICD-10-CM | POA: Insufficient documentation

## 2014-06-11 DIAGNOSIS — R5381 Other malaise: Secondary | ICD-10-CM | POA: Insufficient documentation

## 2014-06-11 DIAGNOSIS — I10 Essential (primary) hypertension: Secondary | ICD-10-CM | POA: Insufficient documentation

## 2014-06-11 DIAGNOSIS — E119 Type 2 diabetes mellitus without complications: Secondary | ICD-10-CM | POA: Diagnosis not present

## 2014-06-11 DIAGNOSIS — Z96659 Presence of unspecified artificial knee joint: Secondary | ICD-10-CM | POA: Insufficient documentation

## 2014-06-11 DIAGNOSIS — R269 Unspecified abnormalities of gait and mobility: Secondary | ICD-10-CM | POA: Insufficient documentation

## 2014-06-11 DIAGNOSIS — M25669 Stiffness of unspecified knee, not elsewhere classified: Secondary | ICD-10-CM | POA: Diagnosis not present

## 2014-06-11 DIAGNOSIS — IMO0001 Reserved for inherently not codable concepts without codable children: Secondary | ICD-10-CM | POA: Insufficient documentation

## 2014-06-13 ENCOUNTER — Ambulatory Visit: Payer: BC Managed Care – PPO | Admitting: Physical Therapy

## 2014-06-13 DIAGNOSIS — IMO0001 Reserved for inherently not codable concepts without codable children: Secondary | ICD-10-CM | POA: Diagnosis not present

## 2014-06-18 ENCOUNTER — Ambulatory Visit: Payer: BC Managed Care – PPO | Admitting: *Deleted

## 2014-06-18 DIAGNOSIS — IMO0001 Reserved for inherently not codable concepts without codable children: Secondary | ICD-10-CM | POA: Diagnosis not present

## 2014-06-20 ENCOUNTER — Ambulatory Visit: Payer: BC Managed Care – PPO | Admitting: Physical Therapy

## 2014-06-20 DIAGNOSIS — IMO0001 Reserved for inherently not codable concepts without codable children: Secondary | ICD-10-CM | POA: Diagnosis not present

## 2014-07-16 ENCOUNTER — Telehealth: Payer: Self-pay | Admitting: Family Medicine

## 2014-07-16 ENCOUNTER — Other Ambulatory Visit: Payer: Self-pay | Admitting: Family Medicine

## 2014-07-26 ENCOUNTER — Other Ambulatory Visit: Payer: Self-pay | Admitting: Nurse Practitioner

## 2014-08-05 ENCOUNTER — Other Ambulatory Visit: Payer: Self-pay | Admitting: Nurse Practitioner

## 2014-08-12 ENCOUNTER — Other Ambulatory Visit: Payer: Self-pay | Admitting: Family Medicine

## 2014-08-12 MED ORDER — LEVOTHYROXINE SODIUM 137 MCG PO TABS: 137.0000 ug | ORAL_TABLET | Freq: Every day | ORAL | Status: DC

## 2014-08-12 MED ORDER — LISINOPRIL 20 MG PO TABS: 20.0000 mg | ORAL_TABLET | Freq: Every day | ORAL | Status: DC

## 2014-08-12 MED ORDER — METOPROLOL TARTRATE 100 MG PO TABS: 100.0000 mg | ORAL_TABLET | Freq: Two times a day (BID) | ORAL | Status: DC

## 2014-08-12 MED ORDER — METFORMIN HCL 1000 MG PO TABS: 1000.0000 mg | ORAL_TABLET | Freq: Two times a day (BID) | ORAL | Status: DC

## 2014-08-12 NOTE — Telephone Encounter (Signed)
done

## 2014-08-17 DIAGNOSIS — Z23 Encounter for immunization: Secondary | ICD-10-CM | POA: Diagnosis not present

## 2014-10-15 ENCOUNTER — Ambulatory Visit: Payer: BC Managed Care – PPO | Admitting: Family Medicine

## 2014-10-24 ENCOUNTER — Other Ambulatory Visit: Payer: Self-pay | Admitting: Family Medicine

## 2014-11-11 ENCOUNTER — Other Ambulatory Visit: Payer: Self-pay | Admitting: Family Medicine

## 2014-11-12 NOTE — Telephone Encounter (Signed)
Last seen 05/17/14 Dr Hyacinth MeekerMiller   Last Hgb A1C 05/17/14  7.0%

## 2014-12-13 ENCOUNTER — Other Ambulatory Visit: Payer: Self-pay | Admitting: Family Medicine

## 2014-12-17 ENCOUNTER — Encounter: Payer: Self-pay | Admitting: Family Medicine

## 2014-12-17 ENCOUNTER — Ambulatory Visit (INDEPENDENT_AMBULATORY_CARE_PROVIDER_SITE_OTHER): Payer: Medicare Other | Admitting: Family Medicine

## 2014-12-17 VITALS — BP 109/73 | HR 61 | Temp 96.9°F | Ht 67.0 in | Wt 167.0 lb

## 2014-12-17 DIAGNOSIS — E119 Type 2 diabetes mellitus without complications: Secondary | ICD-10-CM

## 2014-12-17 DIAGNOSIS — Z23 Encounter for immunization: Secondary | ICD-10-CM

## 2014-12-17 DIAGNOSIS — E039 Hypothyroidism, unspecified: Secondary | ICD-10-CM

## 2014-12-17 DIAGNOSIS — I10 Essential (primary) hypertension: Secondary | ICD-10-CM

## 2014-12-17 DIAGNOSIS — M1990 Unspecified osteoarthritis, unspecified site: Secondary | ICD-10-CM

## 2014-12-17 LAB — POCT GLYCOSYLATED HEMOGLOBIN (HGB A1C): Hemoglobin A1C: 9.9

## 2014-12-17 MED ORDER — METFORMIN HCL 1000 MG PO TABS: 1000.0000 mg | ORAL_TABLET | Freq: Two times a day (BID) | ORAL | Status: DC

## 2014-12-17 MED ORDER — LISINOPRIL 20 MG PO TABS: 20.0000 mg | ORAL_TABLET | Freq: Every day | ORAL | Status: DC

## 2014-12-17 MED ORDER — METOPROLOL TARTRATE 100 MG PO TABS: 100.0000 mg | ORAL_TABLET | Freq: Two times a day (BID) | ORAL | Status: DC

## 2014-12-17 MED ORDER — LEVOTHYROXINE SODIUM 137 MCG PO TABS: 137.0000 ug | ORAL_TABLET | Freq: Every day | ORAL | Status: DC

## 2014-12-17 NOTE — Progress Notes (Signed)
   Subjective:    Patient ID: Meredith Shaw, female    DOB: Jul 01, 1946, 69 y.o.   MRN: [id number]  HPI  69 year old female who is here to follow-up diabetes hypertension and hypothyroid. She recently retired from work and seems to be happy with that. Certainly her blood pressure is much improved. She is checking her blood sugars at home and most of the time fasting sugars are less than 1:30. She denies any foot symptoms. She is now status post bilateral total knee replacements and does well from that standpoint.   Patient Active Problem List   Diagnosis Date Noted  . Arthritis of left knee 04/09/2014  . Status post total knee replacement 04/09/2014  . Arthritis   . Diabetes   . Hypertension   . Kidney stones   . Hypothyroidism   . Degenerative arthritis of knee 06/09/2012   Outpatient Encounter Prescriptions as of 12/17/2014  Medication Sig  . Cholecalciferol (VITAMIN D3) 5000 UNITS CAPS Take 5,000 mg by mouth daily.  Marland Kitchen levothyroxine (SYNTHROID, LEVOTHROID) 137 MCG tablet Take 1 tablet (137 mcg total) by mouth daily before breakfast.  . lisinopril (PRINIVIL,ZESTRIL) 20 MG tablet TAKE ONE (1) TABLET EACH DAY  . metFORMIN (GLUCOPHAGE) 1000 MG tablet Take 1 tablet (1,000 mg total) by mouth 2 (two) times daily with a meal.  . metoprolol (LOPRESSOR) 100 MG tablet Take 1 tablet (100 mg total) by mouth 2 (two) times daily.  . [DISCONTINUED] cetirizine (ZYRTEC) 10 MG tablet Take 10 mg by mouth daily.  . [DISCONTINUED] metFORMIN (GLUCOPHAGE) 1000 MG tablet TAKE ONE TABLET BY MOUTH TWICE DAILY  . [DISCONTINUED] oxyCODONE-acetaminophen (ROXICET) 5-325 MG per tablet Take 1-2 tablets by mouth every 4 (four) hours as needed for severe pain.    Review of Systems     Objective:   Physical Exam   HEENT: Pupils are equal round reactive throat is clear Neck supple without thyromegaly or bruits Chest: Lungs are clear to percussion and auscultation Heart: Normal S1-S2 regular rate and  rhythm Abdomen: Soft without organomegaly Extremities: Feet are normal to gross inspection, pulses are present and symmetric, sensation to monofilament is intact BP 109/73 mmHg  Pulse 61  Temp(Src) 96.9 F (36.1 C) (Oral)  Ht $R'5\' 7"'bJ$  (1.702 m)  Wt 167 lb (75.751 kg)  BMI 26.15 kg/m2       Assessment & Plan:  1. Type 2 diabetes mellitus without complication  - POCT glycosylated hemoglobin (Hb A1C) - POCT UA - Microalbumin  2. Hypothyroidism, unspecified hypothyroidism type  3. Essential hypertension  - Lipid panel - CMP14+EGFR  4. Arthritis   Wardell Honour MD

## 2014-12-17 NOTE — Progress Notes (Signed)
Cost for Zostavax for patient would be $221. Patient declined. Will check cost at future visit.

## 2014-12-17 NOTE — Patient Instructions (Signed)

## 2014-12-18 LAB — CMP14+EGFR
A/G RATIO: 1.5 (ref 1.1–2.5)
ALK PHOS: 130 IU/L — AB (ref 39–117)
ALT: 11 IU/L (ref 0–32)
AST: 11 IU/L (ref 0–40)
Albumin: 3.9 g/dL (ref 3.6–4.8)
BUN/Creatinine Ratio: 21 (ref 11–26)
BUN: 34 mg/dL — ABNORMAL HIGH (ref 8–27)
Bilirubin Total: 0.3 mg/dL (ref 0.0–1.2)
CALCIUM: 9.8 mg/dL (ref 8.7–10.3)
CHLORIDE: 103 mmol/L (ref 97–108)
CO2: 20 mmol/L (ref 18–29)
CREATININE: 1.63 mg/dL — AB (ref 0.57–1.00)
GFR calc Af Amer: 37 mL/min/{1.73_m2} — ABNORMAL LOW (ref >59)
GFR calc non Af Amer: 32 mL/min/{1.73_m2} — ABNORMAL LOW (ref >59)
GLOBULIN, TOTAL: 2.6 g/dL (ref 1.5–4.5)
GLUCOSE: 368 mg/dL — AB (ref 65–99)
Potassium: 5.3 mmol/L — ABNORMAL HIGH (ref 3.5–5.2)
Sodium: 140 mmol/L (ref 134–144)
Total Protein: 6.5 g/dL (ref 6.0–8.5)

## 2014-12-18 LAB — LIPID PANEL
CHOL/HDL RATIO: 7 ratio — AB (ref 0.0–4.4)
CHOLESTEROL TOTAL: 231 mg/dL — AB (ref 100–199)
HDL: 33 mg/dL — ABNORMAL LOW (ref >39)
LDL Calculated: 126 mg/dL — ABNORMAL HIGH (ref 0–99)
TRIGLYCERIDES: 358 mg/dL — AB (ref 0–149)
VLDL Cholesterol Cal: 72 mg/dL — ABNORMAL HIGH (ref 5–40)

## 2014-12-26 ENCOUNTER — Other Ambulatory Visit: Payer: Self-pay | Admitting: Family Medicine

## 2015-02-07 DIAGNOSIS — N390 Urinary tract infection, site not specified: Secondary | ICD-10-CM | POA: Diagnosis not present

## 2015-02-07 DIAGNOSIS — N3020 Other chronic cystitis without hematuria: Secondary | ICD-10-CM | POA: Diagnosis not present

## 2015-02-07 DIAGNOSIS — R351 Nocturia: Secondary | ICD-10-CM | POA: Diagnosis not present

## 2015-02-07 DIAGNOSIS — N200 Calculus of kidney: Secondary | ICD-10-CM | POA: Diagnosis not present

## 2015-03-12 DIAGNOSIS — N3020 Other chronic cystitis without hematuria: Secondary | ICD-10-CM | POA: Diagnosis not present

## 2015-03-12 DIAGNOSIS — N200 Calculus of kidney: Secondary | ICD-10-CM | POA: Diagnosis not present

## 2015-03-12 DIAGNOSIS — N390 Urinary tract infection, site not specified: Secondary | ICD-10-CM | POA: Diagnosis not present

## 2015-03-12 DIAGNOSIS — N1330 Unspecified hydronephrosis: Secondary | ICD-10-CM | POA: Diagnosis not present

## 2015-03-12 DIAGNOSIS — R351 Nocturia: Secondary | ICD-10-CM | POA: Diagnosis not present

## 2015-03-26 DIAGNOSIS — N200 Calculus of kidney: Secondary | ICD-10-CM | POA: Diagnosis not present

## 2015-03-26 DIAGNOSIS — K5730 Diverticulosis of large intestine without perforation or abscess without bleeding: Secondary | ICD-10-CM | POA: Diagnosis not present

## 2015-03-26 DIAGNOSIS — N1330 Unspecified hydronephrosis: Secondary | ICD-10-CM | POA: Diagnosis not present

## 2015-04-08 DIAGNOSIS — N1330 Unspecified hydronephrosis: Secondary | ICD-10-CM | POA: Diagnosis not present

## 2015-04-29 ENCOUNTER — Other Ambulatory Visit: Payer: Self-pay | Admitting: Family Medicine

## 2015-05-09 ENCOUNTER — Other Ambulatory Visit: Payer: Self-pay | Admitting: Family Medicine

## 2015-06-12 ENCOUNTER — Ambulatory Visit (INDEPENDENT_AMBULATORY_CARE_PROVIDER_SITE_OTHER): Payer: Medicare Other

## 2015-06-12 ENCOUNTER — Encounter: Payer: Self-pay | Admitting: Family Medicine

## 2015-06-12 ENCOUNTER — Ambulatory Visit (INDEPENDENT_AMBULATORY_CARE_PROVIDER_SITE_OTHER): Payer: Medicare Other | Admitting: Family Medicine

## 2015-06-12 VITALS — BP 139/84 | HR 53 | Temp 97.5°F | Ht 67.0 in | Wt 165.0 lb

## 2015-06-12 DIAGNOSIS — E119 Type 2 diabetes mellitus without complications: Secondary | ICD-10-CM | POA: Diagnosis not present

## 2015-06-12 DIAGNOSIS — E039 Hypothyroidism, unspecified: Secondary | ICD-10-CM

## 2015-06-12 DIAGNOSIS — Z780 Asymptomatic menopausal state: Secondary | ICD-10-CM | POA: Diagnosis not present

## 2015-06-12 LAB — POCT GLYCOSYLATED HEMOGLOBIN (HGB A1C): Hemoglobin A1C: 6.6

## 2015-06-12 LAB — POCT UA - MICROALBUMIN: Microalbumin Ur, POC: 100 mg/L

## 2015-06-12 NOTE — Progress Notes (Signed)
   Subjective:    Patient ID: Meredith Shaw, female    DOB: 10/13/1945, 69 y.o.   MRN: [id number]  HPI 69 year old female who is here to follow-up diabetes, hypertension, and hypothyroidism. Her last A1c was 9.9 and we suggested better dietary compliance. She checks her sugars at home only once a week and tells me that generally they are less than 140. She denies burning tingling or pain in her lower extremities she does have based on labs from June some degree of chronic kidney disease.  Patient Active Problem List   Diagnosis Date Noted  . Arthritis of left knee 04/09/2014  . Status post total knee replacement 04/09/2014  . Arthritis   . Diabetes   . Hypertension   . Kidney stones   . Hypothyroidism   . Degenerative arthritis of knee 06/09/2012   Outpatient Encounter Prescriptions as of 06/12/2015  Medication Sig  . Cholecalciferol (VITAMIN D3) 5000 UNITS CAPS Take 5,000 mg by mouth daily.  Marland Kitchen levothyroxine (SYNTHROID, LEVOTHROID) 137 MCG tablet TAKE ONE (1) TABLET EACH DAY  . lisinopril (PRINIVIL,ZESTRIL) 20 MG tablet TAKE ONE (1) TABLET EACH DAY  . metFORMIN (GLUCOPHAGE) 1000 MG tablet Take 1 tablet (1,000 mg total) by mouth 2 (two) times daily with a meal.  . metoprolol (LOPRESSOR) 100 MG tablet TAKE ONE TABLET BY MOUTH TWICE DAILY  . [DISCONTINUED] lisinopril (PRINIVIL,ZESTRIL) 20 MG tablet Take 1 tablet (20 mg total) by mouth daily.   No facility-administered encounter medications on file as of 06/12/2015.      Review of Systems  Constitutional: Negative.   HENT: Negative.   Eyes: Negative.   Respiratory: Negative.   Cardiovascular: Negative.   Gastrointestinal: Negative.   Endocrine: Negative.   Genitourinary: Negative.   Hematological: Negative.   Psychiatric/Behavioral: Negative.        Objective:   Physical Exam  Constitutional: She appears well-developed and well-nourished.  Cardiovascular: Normal rate and regular rhythm.   Pulmonary/Chest: Effort normal and  breath sounds normal.          Assessment & Plan:  1. Type 2 diabetes mellitus without complication A1c is pending. If still elevated would probably and another medication to the metformin. - POCT glycosylated hemoglobin (Hb A1C) - POCT UA - Microalbumin  2. Hypothyroidism, unspecified hypothyroidism type TSH was last checked 1 year ago we need to do that today - TSH  3. Post-menopausal She denies symptoms in her hips. She has had 2 knee replacements and does well from that. She does not take calcium or or vitamin D - DG Bone Density; Future Frederica Kuster MD

## 2015-06-12 NOTE — Addendum Note (Signed)
Addended by: Prescott Gum on: 06/12/2015 10:41 AM   Modules accepted: Orders

## 2015-06-13 LAB — MICROALBUMIN, URINE: MICROALBUM., U, RANDOM: 171.5 ug/mL

## 2015-06-13 LAB — TSH: TSH: 0.131 u[IU]/mL — ABNORMAL LOW (ref 0.450–4.500)

## 2015-07-28 ENCOUNTER — Other Ambulatory Visit: Payer: Self-pay | Admitting: Family Medicine

## 2015-08-04 ENCOUNTER — Other Ambulatory Visit: Payer: Self-pay | Admitting: Family Medicine

## 2015-08-28 ENCOUNTER — Ambulatory Visit (INDEPENDENT_AMBULATORY_CARE_PROVIDER_SITE_OTHER): Payer: Medicare Other

## 2015-08-28 DIAGNOSIS — Z23 Encounter for immunization: Secondary | ICD-10-CM

## 2015-09-25 DIAGNOSIS — N390 Urinary tract infection, site not specified: Secondary | ICD-10-CM | POA: Diagnosis not present

## 2015-09-25 DIAGNOSIS — N200 Calculus of kidney: Secondary | ICD-10-CM | POA: Diagnosis not present

## 2015-11-12 ENCOUNTER — Telehealth: Payer: Self-pay | Admitting: Family Medicine

## 2015-11-26 NOTE — Telephone Encounter (Signed)
scheduled

## 2015-12-22 DIAGNOSIS — H5203 Hypermetropia, bilateral: Secondary | ICD-10-CM | POA: Diagnosis not present

## 2015-12-22 DIAGNOSIS — E119 Type 2 diabetes mellitus without complications: Secondary | ICD-10-CM | POA: Diagnosis not present

## 2015-12-23 LAB — HM DIABETES EYE EXAM

## 2016-01-24 ENCOUNTER — Other Ambulatory Visit: Payer: Self-pay | Admitting: Family Medicine

## 2016-01-26 NOTE — Telephone Encounter (Signed)
Last seen 06/12/15  Dr Hyacinth MeekerMiller

## 2016-01-27 MED ORDER — METOPROLOL TARTRATE 100 MG PO TABS: 100.0000 mg | ORAL_TABLET | Freq: Two times a day (BID) | ORAL | Status: DC

## 2016-01-27 MED ORDER — LISINOPRIL 20 MG PO TABS: ORAL_TABLET | ORAL | Status: DC

## 2016-01-27 NOTE — Addendum Note (Signed)
Addended by: Quay BurowPOTTER, JANICE K on: 01/27/2016 08:32 AM   Modules accepted: Orders

## 2016-01-27 NOTE — Addendum Note (Signed)
Addended by: Quay BurowPOTTER, Erinn Huskins K on: 01/27/2016 08:37 AM   Modules accepted: Orders

## 2016-01-31 ENCOUNTER — Other Ambulatory Visit: Payer: Self-pay | Admitting: Family Medicine

## 2016-02-02 ENCOUNTER — Encounter: Payer: Medicare Other | Admitting: *Deleted

## 2016-02-02 DIAGNOSIS — Z1231 Encounter for screening mammogram for malignant neoplasm of breast: Secondary | ICD-10-CM | POA: Diagnosis not present

## 2016-02-02 LAB — HM MAMMOGRAPHY

## 2016-02-02 NOTE — Telephone Encounter (Signed)
Last seen 06/12/15  Dr Miller 

## 2016-02-02 NOTE — Telephone Encounter (Signed)
This patient needs an appointment for follow-up. Please schedule this.

## 2016-02-03 DIAGNOSIS — H35031 Hypertensive retinopathy, right eye: Secondary | ICD-10-CM | POA: Diagnosis not present

## 2016-02-11 ENCOUNTER — Encounter: Payer: Self-pay | Admitting: *Deleted

## 2016-02-14 ENCOUNTER — Encounter (HOSPITAL_COMMUNITY): Payer: Self-pay | Admitting: *Deleted

## 2016-02-14 ENCOUNTER — Emergency Department (HOSPITAL_COMMUNITY)
Admission: EM | Admit: 2016-02-14 | Discharge: 2016-02-14 | Disposition: A | Discharge status: Home / Self Care | Payer: Medicare Other | Attending: Emergency Medicine | Admitting: Emergency Medicine

## 2016-02-14 ENCOUNTER — Emergency Department (HOSPITAL_COMMUNITY): Payer: Medicare Other

## 2016-02-14 DIAGNOSIS — R0789 Other chest pain: Secondary | ICD-10-CM | POA: Insufficient documentation

## 2016-02-14 DIAGNOSIS — I129 Hypertensive chronic kidney disease with stage 1 through stage 4 chronic kidney disease, or unspecified chronic kidney disease: Secondary | ICD-10-CM | POA: Diagnosis not present

## 2016-02-14 DIAGNOSIS — E1122 Type 2 diabetes mellitus with diabetic chronic kidney disease: Secondary | ICD-10-CM | POA: Diagnosis not present

## 2016-02-14 DIAGNOSIS — Z7984 Long term (current) use of oral hypoglycemic drugs: Secondary | ICD-10-CM | POA: Diagnosis not present

## 2016-02-14 DIAGNOSIS — N189 Chronic kidney disease, unspecified: Secondary | ICD-10-CM | POA: Diagnosis not present

## 2016-02-14 DIAGNOSIS — R110 Nausea: Secondary | ICD-10-CM | POA: Diagnosis not present

## 2016-02-14 DIAGNOSIS — R0602 Shortness of breath: Secondary | ICD-10-CM | POA: Insufficient documentation

## 2016-02-14 DIAGNOSIS — R0781 Pleurodynia: Secondary | ICD-10-CM | POA: Diagnosis not present

## 2016-02-14 DIAGNOSIS — Z7982 Long term (current) use of aspirin: Secondary | ICD-10-CM | POA: Insufficient documentation

## 2016-02-14 DIAGNOSIS — E039 Hypothyroidism, unspecified: Secondary | ICD-10-CM | POA: Diagnosis not present

## 2016-02-14 DIAGNOSIS — Z79899 Other long term (current) drug therapy: Secondary | ICD-10-CM | POA: Insufficient documentation

## 2016-02-14 DIAGNOSIS — M545 Low back pain: Secondary | ICD-10-CM | POA: Insufficient documentation

## 2016-02-14 DIAGNOSIS — Z880 Allergy status to penicillin: Secondary | ICD-10-CM | POA: Diagnosis not present

## 2016-02-14 DIAGNOSIS — R079 Chest pain, unspecified: Secondary | ICD-10-CM | POA: Diagnosis present

## 2016-02-14 DIAGNOSIS — Z87442 Personal history of urinary calculi: Secondary | ICD-10-CM | POA: Insufficient documentation

## 2016-02-14 LAB — CBC
HEMATOCRIT: 38.2 % (ref 36.0–46.0)
Hemoglobin: 12.7 g/dL (ref 12.0–15.0)
MCH: 30.3 pg (ref 26.0–34.0)
MCHC: 33.2 g/dL (ref 30.0–36.0)
MCV: 91.2 fL (ref 78.0–100.0)
Platelets: 225 10*3/uL (ref 150–400)
RBC: 4.19 MIL/uL (ref 3.87–5.11)
RDW: 13.9 % (ref 11.5–15.5)
WBC: 7.6 10*3/uL (ref 4.0–10.5)

## 2016-02-14 LAB — BASIC METABOLIC PANEL
Anion gap: 15 (ref 5–15)
BUN: 33 mg/dL — AB (ref 6–20)
CHLORIDE: 106 mmol/L (ref 101–111)
CO2: 18 mmol/L — AB (ref 22–32)
CREATININE: 1.73 mg/dL — AB (ref 0.44–1.00)
Calcium: 9.5 mg/dL (ref 8.9–10.3)
GFR calc Af Amer: 34 mL/min — ABNORMAL LOW (ref >60)
GFR calc non Af Amer: 29 mL/min — ABNORMAL LOW (ref >60)
GLUCOSE: 222 mg/dL — AB (ref 65–99)
POTASSIUM: 4.4 mmol/L (ref 3.5–5.1)
SODIUM: 139 mmol/L (ref 135–145)

## 2016-02-14 LAB — I-STAT TROPONIN, ED: Troponin i, poc: 0 ng/mL (ref 0.00–0.08)

## 2016-02-14 LAB — TROPONIN I

## 2016-02-14 NOTE — ED Notes (Signed)
Pt reports sharp mid chest pains x 1 week, pain increases when breathing. Denies recent cough. Has occ sob. Airway intact and ekg done at triage.

## 2016-02-14 NOTE — Discharge Instructions (Signed)
Chest Wall Pain  Please schedule appt to arrange stress testing with primary care doctor. Come back to ED for passing out, coughing blood, worsening chest pain, vomiting.    Chest wall pain is pain in or around the bones and muscles of your chest. Sometimes, an injury causes this pain. Sometimes, the cause may not be known. This pain may take several weeks or longer to get better. HOME CARE Pay attention to any changes in your symptoms. Take these actions to help with your pain:  Rest as told by your doctor.  Avoid activities that cause pain. Try not to use your chest, belly (abdominal), or side muscles to lift heavy things.  If directed, apply ice to the painful area:  Put ice in a plastic bag.  Place a towel between your skin and the bag.  Leave the ice on for 20 minutes, 2-3 times per day.  Take over-the-counter and prescription medicines only as told by your doctor.  Do not use tobacco products, including cigarettes, chewing tobacco, and e-cigarettes. If you need help quitting, ask your doctor.  Keep all follow-up visits as told by your doctor. This is important. GET HELP IF:  You have a fever.  Your chest pain gets worse.  You have new symptoms. GET HELP RIGHT AWAY IF:  You feel sick to your stomach (nauseous) or you throw up (vomit).  You feel sweaty or light-headed.  You have a cough with phlegm (sputum) or you cough up blood.  You are short of breath.   This information is not intended to replace advice given to you by your health care provider. Make sure you discuss any questions you have with your health care provider.   Document Released: 03/15/2008 Document Revised: 06/18/2015 Document Reviewed: 12/23/2014 Elsevier Interactive Patient Education [employer]2016 [employer]

## 2016-02-14 NOTE — ED Notes (Signed)
MD at bedside. 

## 2016-02-14 NOTE — ED Provider Notes (Signed)
CSN: [id number]     Arrival date & time 02/14/16  1330 History   First MD Initiated Contact with Patient 02/14/16 1633     Chief Complaint  Patient presents with  . Chest Pain     (Consider location/radiation/quality/duration/timing/severity/associated sxs/prior Treatment) Patient is a 70 y.o. female presenting with chest pain. The history is provided by the patient.  Chest Pain Pain location:  Substernal area Pain quality: sharp   Pain radiates to the back: yes (not migrating, not tearing)   Onset quality:  Gradual Duration: 2 weeks. Timing:  Constant Progression:  Unchanged Chronicity:  New Context: breathing and at rest   Context comment:  Awakens her up at night Ineffective treatments: no exertional pain. Associated symptoms: back pain, nausea (rarely) and shortness of breath   Associated symptoms: no abdominal pain, no claudication, no cough, no diaphoresis, no dysphagia, no fatigue, no fever, no headache, no heartburn, no lower extremity edema, no near-syncope, no palpitations, no syncope, not vomiting and no weakness   Associated symptoms comment:  No hemoptysis or sputum Risk factors: diabetes mellitus and hypertension   Risk factors: no coronary artery disease, no high cholesterol, not female, not obese, no prior DVT/PE and no smoking   Mother had cabg in her 69s  Past Medical History  Diagnosis Date  . Arthritis     OA AND PAIN BOTH KNEES -RIGHT KNEE HURTS WORSE THAN LEFT  . Diabetes mellitus     ORAL MEDICATION - NO INSULIN  . Hypertension   . Kidney stones     NONE AT PRESENT TIME THAT PT IS AWARE OF  . Hypothyroidism    Past Surgical History  Procedure Laterality Date  . Percutaneous nephrolithotomy    . Growth removed from thyroid    . Abdominal hysterectomy    . Eye surgery      RIGHT CATARACT EXTRACTON WITH LENS IMPLANT  . Cholecystectomy  1972  . Total knee arthroplasty  06/09/2012    Procedure: TOTAL KNEE ARTHROPLASTY;  Surgeon: Kathryne Hitch,  MD;  Location: [facility];  Service: Orthopedics;  Laterality: Right;  Right Total Knee Arthroplasty  . Tubal ligation    . Colonoscopy    . Total knee arthroplasty Left 04/09/2014    Procedure: LEFT TOTAL KNEE ARTHROPLASTY;  Surgeon: Kathryne Hitch, MD;  Location: [facility];  Service: Orthopedics;  Laterality: Left;   Family History  Problem Relation Age of Onset  . Stroke Father   . Heart disease Father    Social History  Substance Use Topics  . Smoking status: Never Smoker   . Smokeless tobacco: Never Used  . Alcohol Use: No   OB History    No data available     Review of Systems  Constitutional: Negative for fever, chills, diaphoresis and fatigue.  HENT: Negative for congestion and trouble swallowing.   Respiratory: Positive for shortness of breath. Negative for cough and wheezing.   Cardiovascular: Positive for chest pain. Negative for palpitations, claudication, syncope and near-syncope.  Gastrointestinal: Positive for nausea (rarely). Negative for heartburn, vomiting and abdominal pain.  Musculoskeletal: Positive for back pain.  Skin: Negative for rash.  Neurological: Negative for syncope, weakness and headaches.  All other systems reviewed and are negative.     Allergies  Penicillins  Home Medications   Prior to Admission medications   Medication Sig Start Date End Date Taking? Authorizing Provider  aspirin 81 MG tablet Take 81 mg by mouth daily.   Yes Historical Provider, MD  Cholecalciferol (VITAMIN D3) 5000 UNITS CAPS Take 5,000 mg by mouth daily.   Yes Historical Provider, MD  levothyroxine (SYNTHROID, LEVOTHROID) 137 MCG tablet TAKE ONE (1) TABLET EACH DAY 08/04/15  Yes Frederica Kuster, MD  lisinopril (PRINIVIL,ZESTRIL) 20 MG tablet TAKE ONE (1) TABLET EACH DAY 01/27/16  Yes Frederica Kuster, MD  metFORMIN (GLUCOPHAGE) 1000 MG tablet TAKE ONE TABLET BY MOUTH TWICE DAILY 02/02/16  Yes Ernestina Penna, MD  metoprolol (LOPRESSOR) 100 MG tablet Take 1 tablet (100  mg total) by mouth 2 (two) times daily. 01/27/16  Yes Frederica Kuster, MD   BP 162/94 mmHg  Pulse 62  Temp(Src) 97.8 F (36.6 C) (Oral)  Resp 18  Ht 5\' 7"  (1.702 m)  Wt 78.472 kg  BMI 27.09 kg/m2  SpO2 97% Physical Exam  Constitutional: She is oriented to person, place, and time. She appears well-developed and well-nourished. No distress.  HENT:  Head: Normocephalic and atraumatic.  Nose: Nose normal.  Eyes: Conjunctivae are normal. Pupils are equal, round, and reactive to light.  No conj pallor  Neck: Normal range of motion. Neck supple. No tracheal deviation present.  Cardiovascular: Normal rate, regular rhythm and normal heart sounds.   No murmur heard. Pulmonary/Chest: Effort normal and breath sounds normal. No respiratory distress. She has no rales. She exhibits tenderness (bialteral ribs and sternum).  Abdominal: Soft. Bowel sounds are normal. She exhibits no distension and no mass. There is no tenderness.  Musculoskeletal: Normal range of motion. She exhibits no edema.  No lower extremity edema, calf tenderness, warmth, erythema or palpable cords   Neurological: She is alert and oriented to person, place, and time.  Skin: Skin is warm and dry. No rash noted.  Psychiatric: She has a normal mood and affect.  Nursing note and vitals reviewed.   ED Course  Procedures (including critical care time) Labs Review Labs Reviewed  BASIC METABOLIC PANEL - Abnormal; Notable for the following:    CO2 18 (*)    Glucose, Bld 222 (*)    BUN 33 (*)    Creatinine, Ser 1.73 (*)    GFR calc non Af Amer 29 (*)    GFR calc Af Amer 34 (*)    All other components within normal limits  CBC  TROPONIN I  Rosezena Sensor, ED    Imaging Review Dg Chest 2 View  02/14/2016  CLINICAL DATA:  Central chest pain.  Shortness of breath for 1 week. EXAM: CHEST  2 VIEW COMPARISON:  04/02/2014 FINDINGS: Mild elevation of the right hemidiaphragm is unchanged. Both lungs are clear. Heart and  mediastinum are within normal limits. The trachea is midline. Again noted are surgical clips in the lower neck. No pleural effusions. Degenerative changes in the thoracic spine. IMPRESSION: No active cardiopulmonary disease. Electronically Signed   By: Richarda Overlie M.D.   On: 02/14/2016 14:10   I have personally reviewed and evaluated these images and lab results as part of my medical decision-making.   EKG Interpretation   Date/Time:  Saturday Feb 14 2016 13:35:55 EDT Ventricular Rate:  64 PR Interval:  168 QRS Duration: 76 QT Interval:  416 QTC Calculation: 429 R Axis:   11 Text Interpretation:  Normal sinus rhythm Cannot rule out Anterior infarct  , age undetermined Abnormal ECG Confirmed by RAY MD, Duwayne Heck [phone]) on  02/14/2016 5:05:55 PM      MDM   Final diagnoses:  Pleuritic chest pain  CKD (chronic kidney disease), unspecified stage  Repeat sig significant past medical history of diabetes and hypertension emergency department for evaluation of sternal chest pain after she had a mammogram 2 weeks ago. Pain is worse with the palpation move her arms consider an MSK source as diagnosis of exclusion. HEART score four at a minimum. EKG without ischemic insult delta troponin negative recommended admission for stress testing the patient declined and would prefer to have outpatient testing with her PCP. Chest x-ray without wide mediastinum or pneumonia or pneumothorax. Doubt dissection as source of pain, as pain resolved and mediastinum wnl.  I considered PE given pleurisy. Wells score 0. Ordered CTA however patient is unable to get a contrast load due to her kidney disease. No hypoxia or tachypnea hemoptysis or signs of DVT. Patient refuses admission for further PE workup. I had extensive discussions with the patient regarding recommendation of admission. She is aware of the risk of missed ACS and pulmonary embolism.  SHe has good PCP f/u. She will return to ED for worsening of condition.      6:56 PM no chest pain. VSS.  Feeling very well.  Reaffirms that she does not want to be admitted and wants to only have outpt f/u/    Sofie RowerMike Davonte Siebenaler, MD 02/14/16 1900  Margarita Grizzleanielle Ray, MD 02/14/16 2157

## 2016-02-19 ENCOUNTER — Encounter: Payer: Self-pay | Admitting: Family Medicine

## 2016-02-19 ENCOUNTER — Ambulatory Visit (INDEPENDENT_AMBULATORY_CARE_PROVIDER_SITE_OTHER): Payer: Medicare Other | Admitting: Family Medicine

## 2016-02-19 VITALS — BP 188/90 | HR 55 | Temp 98.0°F | Ht 67.0 in | Wt 174.0 lb

## 2016-02-19 DIAGNOSIS — I10 Essential (primary) hypertension: Secondary | ICD-10-CM

## 2016-02-19 DIAGNOSIS — R079 Chest pain, unspecified: Secondary | ICD-10-CM

## 2016-02-19 MED ORDER — PREDNISONE 10 MG PO TABS: ORAL_TABLET | ORAL | Status: DC

## 2016-02-19 MED ORDER — AMLODIPINE BESYLATE 5 MG PO TABS: 5.0000 mg | ORAL_TABLET | Freq: Every day | ORAL | Status: DC

## 2016-02-19 NOTE — Progress Notes (Signed)
   Subjective:    Patient ID: Meredith Shaw, female    DOB: 09/09/1946, 70 y.o.   MRN: [id number]  HPI [facility] She want last Saturday and Dx was CKD and Pleuritic chest pain. Chest soreness is better but she also saw optometry and was told that she had hypertensive retinopathy. Blood pressures have been elevated. Today is 188/90 here at home pressures of been 172/90 range and in the emergency room her pressure was 162/94. Current medications include lisinopril and metoprolol.      Patient Active Problem List   Diagnosis Date Noted  . Arthritis of left knee 04/09/2014  . Status post total knee replacement 04/09/2014  . Arthritis   . Diabetes (HCC)   . Hypertension   . Kidney stones   . Hypothyroidism   . Degenerative arthritis of knee 06/09/2012   Outpatient Encounter Prescriptions as of 02/19/2016  Medication Sig  . aspirin 81 MG tablet Take 81 mg by mouth daily.  . Cholecalciferol (VITAMIN D3) 5000 UNITS CAPS Take 2,000 mg by mouth daily.   Marland Kitchen. levothyroxine (SYNTHROID, LEVOTHROID) 137 MCG tablet TAKE ONE (1) TABLET EACH DAY  . lisinopril (PRINIVIL,ZESTRIL) 20 MG tablet TAKE ONE (1) TABLET EACH DAY  . metFORMIN (GLUCOPHAGE) 1000 MG tablet TAKE ONE TABLET BY MOUTH TWICE DAILY  . metoprolol (LOPRESSOR) 100 MG tablet Take 1 tablet (100 mg total) by mouth 2 (two) times daily.   No facility-administered encounter medications on file as of 02/19/2016.      Review of Systems  Constitutional: Negative.   HENT: Negative.   Eyes: Negative.   Respiratory: Positive for chest tightness (soreness  - better).   Cardiovascular: Negative.   Gastrointestinal: Negative.   Endocrine: Negative.   Genitourinary: Negative.   Musculoskeletal: Negative.   Skin: Negative.   Allergic/Immunologic: Negative.   Neurological: Negative.   Hematological: Negative.   Psychiatric/Behavioral: Negative.        Objective:   Physical Exam  Constitutional:  She appears well-developed and well-nourished.  Cardiovascular: Normal rate, regular rhythm and normal heart sounds.   Pulmonary/Chest: Effort normal and breath sounds normal. She exhibits no tenderness.  Psychiatric: She has a normal mood and affect.    BP 188/90 mmHg  Pulse 55  Temp(Src) 98 F (36.7 C) (Oral)  Ht 5\' 7"  (1.702 m)  Wt 174 lb (78.926 kg)  BMI 27.25 kg/m2       Assessment & Plan:  1. Essential hypertension Blood pressure has not been well controlled. Will add amlodipine 5 mg and follow  2. Chest pain, unspecified chest pain type Chest pain felt to be pleuritic it is improving I will give her a short course of prednisone 4040 4030 2010 mg on consecutive days  Frederica KusterStephen M Marybell Robards MD

## 2016-03-04 ENCOUNTER — Other Ambulatory Visit: Payer: Self-pay | Admitting: Family Medicine

## 2016-04-04 ENCOUNTER — Other Ambulatory Visit: Payer: Self-pay | Admitting: Family Medicine

## 2016-04-19 DIAGNOSIS — E119 Type 2 diabetes mellitus without complications: Secondary | ICD-10-CM | POA: Diagnosis not present

## 2016-04-19 DIAGNOSIS — H35031 Hypertensive retinopathy, right eye: Secondary | ICD-10-CM | POA: Diagnosis not present

## 2016-04-21 ENCOUNTER — Ambulatory Visit (INDEPENDENT_AMBULATORY_CARE_PROVIDER_SITE_OTHER): Payer: Medicare Other | Admitting: Family Medicine

## 2016-04-21 ENCOUNTER — Encounter: Payer: Self-pay | Admitting: Family Medicine

## 2016-04-21 VITALS — BP 139/79 | HR 63 | Temp 97.0°F | Ht 67.0 in | Wt 173.8 lb

## 2016-04-21 DIAGNOSIS — E039 Hypothyroidism, unspecified: Secondary | ICD-10-CM | POA: Diagnosis not present

## 2016-04-21 DIAGNOSIS — E119 Type 2 diabetes mellitus without complications: Secondary | ICD-10-CM

## 2016-04-21 DIAGNOSIS — I10 Essential (primary) hypertension: Secondary | ICD-10-CM | POA: Diagnosis not present

## 2016-04-21 LAB — BAYER DCA HB A1C WAIVED: HB A1C (BAYER DCA - WAIVED): 9 % — ABNORMAL HIGH (ref <7.0)

## 2016-04-21 NOTE — Progress Notes (Signed)
Subjective:    Patient ID: Meredith Shaw, female    DOB: 09-21-1946, 70 y.o.   MRN: [id number]  HPI  Patient is here today for a follow up on diabetes, hypertension, and hypothyroidism.Sugars have been doing better. Blood pressure has come down nicely since initiation of amlodipine. She has no specific complaints today  Review of Systems  Constitutional: Negative.   HENT: Negative.   Eyes: Negative.   Respiratory: Negative.   Cardiovascular: Negative.   Gastrointestinal: Negative.   Endocrine: Negative.   Genitourinary: Negative.   Musculoskeletal: Negative.   Skin: Negative.   Allergic/Immunologic: Negative.   Neurological: Negative.   Hematological: Negative.   Psychiatric/Behavioral: Negative.    Depression screen [facility] 2/9 04/21/2016 02/19/2016 12/17/2014  Decreased Interest 0 0 3  Down, Depressed, Hopeless 0 0 0  PHQ - 2 Score 0 0 3  Altered sleeping - - 0  Tired, decreased energy - - 0  Change in appetite - - 0  Feeling bad or failure about yourself  - - 0  Trouble concentrating - - 0  Moving slowly or fidgety/restless - - 0  Suicidal thoughts - - 0  PHQ-9 Score - - 3        Patient Active Problem List   Diagnosis Date Noted  . Chest pain 02/19/2016  . Arthritis of left knee 04/09/2014  . Status post total knee replacement 04/09/2014  . Arthritis   . Diabetes ([place])   . Hypertension   . Kidney stones   . Hypothyroidism   . Degenerative arthritis of knee 06/09/2012   Outpatient Encounter Prescriptions as of 04/21/2016  Medication Sig  . amLODipine (NORVASC) 5 MG tablet Take 1 tablet (5 mg total) by mouth daily.  Marland Kitchen aspirin 81 MG tablet Take 81 mg by mouth daily.  . Cholecalciferol (VITAMIN D3) 5000 UNITS CAPS Take 2,000 mg by mouth daily.   Marland Kitchen levothyroxine (SYNTHROID, LEVOTHROID) 137 MCG tablet TAKE ONE (1) TABLET EACH DAY  . lisinopril (PRINIVIL,ZESTRIL) 20 MG tablet TAKE ONE (1) TABLET EACH DAY  . metFORMIN (GLUCOPHAGE) 1000 MG tablet TAKE ONE TABLET BY  MOUTH TWICE DAILY  . metoprolol (LOPRESSOR) 100 MG tablet Take 1 tablet (100 mg total) by mouth 2 (two) times daily.  . [DISCONTINUED] predniSONE (DELTASONE) 10 MG tablet Take 22m daily for 4 days, then 382mx 1 day, then 2055m 1 day, then 42m7m1 day.   No facility-administered encounter medications on file as of 04/21/2016.       Objective:   Physical Exam  Constitutional: She is oriented to person, place, and time. She appears well-developed and well-nourished.  Cardiovascular: Normal rate, regular rhythm, normal heart sounds and intact distal pulses.   Pulmonary/Chest: Effort normal and breath sounds normal.  Abdominal: Soft.  Neurological: She is alert and oriented to person, place, and time.  Psychiatric: She has a normal mood and affect. Her behavior is normal.   BP 139/79 mmHg  Pulse 63  Temp(Src) 97 F (36.1 C) (Oral)  Ht 5' 7"  (1.702 m)  Wt 173 lb 12.8 oz (78.835 kg)  BMI 27.21 kg/m2        Assessment & Plan:  1. Essential hypertension Blood pressure is near normal. Continue on same drug regimen - Lipid panel - CMP14+EGFR  2. Hypothyroidism, unspecified hypothyroidism type As H was last checked some time ago. She continues on 137 g thyroid replacement - Thyroid Panel With TSH  3. Type 2 diabetes mellitus without complication, without long-term current  use of insulin (HCC) Last A1c was 6.6 10 months ago - Bayer DCA Hb A1c Waived - Lipid panel - Microalbumin / creatinine urine ratio Wardell Honour MD

## 2016-04-22 LAB — CMP14+EGFR
A/G RATIO: 1.7 (ref 1.2–2.2)
ALK PHOS: 73 IU/L (ref 39–117)
ALT: 12 IU/L (ref 0–32)
AST: 12 IU/L (ref 0–40)
Albumin: 4 g/dL (ref 3.6–4.8)
BILIRUBIN TOTAL: 0.2 mg/dL (ref 0.0–1.2)
BUN/Creatinine Ratio: 20 (ref 12–28)
BUN: 28 mg/dL — ABNORMAL HIGH (ref 8–27)
CALCIUM: 9.4 mg/dL (ref 8.7–10.3)
CHLORIDE: 102 mmol/L (ref 96–106)
CO2: 19 mmol/L (ref 18–29)
Creatinine, Ser: 1.37 mg/dL — ABNORMAL HIGH (ref 0.57–1.00)
GFR calc Af Amer: 45 mL/min/{1.73_m2} — ABNORMAL LOW (ref >59)
GFR, EST NON AFRICAN AMERICAN: 39 mL/min/{1.73_m2} — AB (ref >59)
GLOBULIN, TOTAL: 2.4 g/dL (ref 1.5–4.5)
Glucose: 204 mg/dL — ABNORMAL HIGH (ref 65–99)
POTASSIUM: 4.6 mmol/L (ref 3.5–5.2)
SODIUM: 140 mmol/L (ref 134–144)
Total Protein: 6.4 g/dL (ref 6.0–8.5)

## 2016-04-22 LAB — THYROID PANEL WITH TSH
FREE THYROXINE INDEX: 3.3 (ref 1.2–4.9)
T3 UPTAKE RATIO: 33 % (ref 24–39)
T4 TOTAL: 10.1 ug/dL (ref 4.5–12.0)
TSH: 2.69 u[IU]/mL (ref 0.450–4.500)

## 2016-04-22 LAB — MICROALBUMIN / CREATININE URINE RATIO
Creatinine, Urine: 79.8 mg/dL
MICROALB/CREAT RATIO: 383.1 mg/g{creat} — AB (ref 0.0–30.0)
MICROALBUM., U, RANDOM: 305.7 ug/mL

## 2016-04-22 LAB — LIPID PANEL
CHOL/HDL RATIO: 6.7 ratio — AB (ref 0.0–4.4)
Cholesterol, Total: 221 mg/dL — ABNORMAL HIGH (ref 100–199)
HDL: 33 mg/dL — ABNORMAL LOW (ref >39)
LDL CALC: 119 mg/dL — AB (ref 0–99)
TRIGLYCERIDES: 346 mg/dL — AB (ref 0–149)
VLDL Cholesterol Cal: 69 mg/dL — ABNORMAL HIGH (ref 5–40)

## 2016-04-27 ENCOUNTER — Other Ambulatory Visit: Payer: Self-pay | Admitting: Family Medicine

## 2016-05-10 ENCOUNTER — Encounter: Payer: Self-pay | Admitting: *Deleted

## 2016-08-06 ENCOUNTER — Other Ambulatory Visit: Payer: Self-pay | Admitting: Family Medicine

## 2016-08-11 ENCOUNTER — Other Ambulatory Visit: Payer: Self-pay | Admitting: Family Medicine

## 2016-08-26 ENCOUNTER — Ambulatory Visit (INDEPENDENT_AMBULATORY_CARE_PROVIDER_SITE_OTHER): Payer: Medicare Other

## 2016-08-26 DIAGNOSIS — Z23 Encounter for immunization: Secondary | ICD-10-CM

## 2016-09-09 ENCOUNTER — Other Ambulatory Visit: Payer: Self-pay | Admitting: Family Medicine

## 2016-10-12 ENCOUNTER — Other Ambulatory Visit: Payer: Self-pay | Admitting: Family Medicine

## 2016-10-15 ENCOUNTER — Telehealth: Payer: Self-pay | Admitting: Pharmacist

## 2016-10-15 NOTE — Telephone Encounter (Signed)
Call to make follow up appt with PCP / Dr Hyacinth MeekerMiller.  No ans but LM on VM to call office to make appt.

## 2016-10-19 DIAGNOSIS — L97521 Non-pressure chronic ulcer of other part of left foot limited to breakdown of skin: Secondary | ICD-10-CM | POA: Diagnosis not present

## 2016-11-02 DIAGNOSIS — L97422 Non-pressure chronic ulcer of left heel and midfoot with fat layer exposed: Secondary | ICD-10-CM | POA: Diagnosis not present

## 2016-11-03 ENCOUNTER — Other Ambulatory Visit: Payer: Self-pay | Admitting: Family Medicine

## 2016-11-17 ENCOUNTER — Other Ambulatory Visit: Payer: Self-pay | Admitting: Family Medicine

## 2016-11-18 DIAGNOSIS — L97421 Non-pressure chronic ulcer of left heel and midfoot limited to breakdown of skin: Secondary | ICD-10-CM | POA: Diagnosis not present

## 2016-12-09 DIAGNOSIS — B351 Tinea unguium: Secondary | ICD-10-CM | POA: Diagnosis not present

## 2016-12-09 DIAGNOSIS — L84 Corns and callosities: Secondary | ICD-10-CM | POA: Diagnosis not present

## 2016-12-09 DIAGNOSIS — E1142 Type 2 diabetes mellitus with diabetic polyneuropathy: Secondary | ICD-10-CM | POA: Diagnosis not present

## 2016-12-20 ENCOUNTER — Other Ambulatory Visit: Payer: Self-pay | Admitting: Family Medicine

## 2017-01-20 ENCOUNTER — Other Ambulatory Visit: Payer: Self-pay | Admitting: Family Medicine

## 2017-01-20 NOTE — Telephone Encounter (Signed)
Go ahead and refill medications but let patient know she needs to come in prior to next refill

## 2017-01-20 NOTE — Telephone Encounter (Signed)
Patient aware and virilizes understanding.

## 2017-02-16 ENCOUNTER — Other Ambulatory Visit: Payer: Self-pay | Admitting: Family Medicine

## 2017-02-17 DIAGNOSIS — B351 Tinea unguium: Secondary | ICD-10-CM | POA: Diagnosis not present

## 2017-02-17 DIAGNOSIS — L84 Corns and callosities: Secondary | ICD-10-CM | POA: Diagnosis not present

## 2017-02-17 DIAGNOSIS — E1142 Type 2 diabetes mellitus with diabetic polyneuropathy: Secondary | ICD-10-CM | POA: Diagnosis not present

## 2017-02-17 DIAGNOSIS — M79676 Pain in unspecified toe(s): Secondary | ICD-10-CM | POA: Diagnosis not present

## 2017-03-16 ENCOUNTER — Ambulatory Visit (INDEPENDENT_AMBULATORY_CARE_PROVIDER_SITE_OTHER): Payer: Medicare Other | Admitting: *Deleted

## 2017-03-16 VITALS — BP 152/81 | HR 59 | Ht 65.75 in | Wt 172.0 lb

## 2017-03-16 DIAGNOSIS — Z1211 Encounter for screening for malignant neoplasm of colon: Secondary | ICD-10-CM

## 2017-03-16 DIAGNOSIS — Z0000 Encounter for general adult medical examination without abnormal findings: Secondary | ICD-10-CM

## 2017-03-16 NOTE — Patient Instructions (Addendum)
  [name] , Thank you for taking time to come for your Medicare Wellness Visit. I appreciate your ongoing commitment to your health goals. Please review the following plan we discussed and let me know if I can assist you in the future.   These are the goals we discussed: Goals    . Exercise 3x per week (30 min per time)          Continue to walk to for at least 30 minutes daily. Try to increase to 45 minutes if as tolerated.       Move slowly to avoid falls Keep physical appointment with Rex Krasarol Vincent, MD on 04/01/17 Consider Shingrix (shingles vaccine) and check on the cost at your next visit. Return stool specimen card Will need a tetanus update if you have a dirty injury like a dog bite or a dirty cut.  Bring a copy of your living will so that we can scan it into your chart.   This is a list of the screening recommended for you and due dates:  Health Maintenance  Topic Date Due  .  Hepatitis C: One time screening is recommended by Center for Disease Control  (CDC) for  adults born from [birth year] through 1965.   1946/05/09  . Stool Blood Test  06/08/1996  . Pneumonia vaccines (2 of 2 - PPSV23) 12/17/2015  . Complete foot exam   06/11/2016  . Hemoglobin A1C  10/22/2016  . Eye exam for diabetics  12/22/2016  . Flu Shot  05/11/2017  . Mammogram  02/01/2018  . Tetanus Vaccine  02/09/2019  . DEXA scan (bone density measurement)  Completed

## 2017-03-16 NOTE — Progress Notes (Signed)
Subjective:   Meredith Shaw is a 71 y.o. female who presents for an Initial Medicare Annual Wellness Visit. Mrs. Fortner lives at home with her husband. They have one daughter and one son and 4 grandchildren. She enjoys reading and walks around her yard daily. She was a patient of Dr Rondel Baton but will be transitioning to Dr Oswaldo Done.   Review of Systems     Cardiac Risk Factors include: advanced age (>57men, >78 women);diabetes mellitus   Other systems negative    Reports that her health is about the same as last year.  Objective:    Today's Vitals   03/16/17 1026 03/16/17 1027  BP: (!) 159/89 (!) 152/81  Pulse: 60 (!) 59  Weight: 172 lb (78 kg)   Height: 5' 5.75" (1.67 m)    Body mass index is 27.97 kg/m.   Current Medications (verified) Outpatient Encounter Prescriptions as of 03/16/2017  Medication Sig  . amLODipine (NORVASC) 5 MG tablet TAKE ONE (1) TABLET EACH DAY  . aspirin 81 MG tablet Take 81 mg by mouth daily.  . Cholecalciferol (VITAMIN D3) 5000 UNITS CAPS Take 2,000 mg by mouth daily.   Marland Kitchen levothyroxine (SYNTHROID, LEVOTHROID) 137 MCG tablet TAKE ONE (1) TABLET EACH DAY  . lisinopril (PRINIVIL,ZESTRIL) 20 MG tablet TAKE ONE (1) TABLET EACH DAY  . metFORMIN (GLUCOPHAGE) 1000 MG tablet TAKE ONE TABLET BY MOUTH TWICE DAILY  . metoprolol (LOPRESSOR) 100 MG tablet TAKE ONE TABLET BY MOUTH TWICE DAILY   No facility-administered encounter medications on file as of 03/16/2017.     Allergies (verified) Penicillins   History: Past Medical History:  Diagnosis Date  . Arthritis    OA AND PAIN BOTH KNEES -RIGHT KNEE HURTS WORSE THAN LEFT  . Diabetes mellitus    ORAL MEDICATION - NO INSULIN  . Hypertension   . Hypothyroidism   . Kidney stones    NONE AT PRESENT TIME THAT PT IS AWARE OF   Past Surgical History:  Procedure Laterality Date  . ABDOMINAL HYSTERECTOMY    . CHOLECYSTECTOMY  1972  . COLONOSCOPY    . EYE SURGERY     RIGHT CATARACT EXTRACTON WITH LENS  IMPLANT  . GROWTH REMOVED FROM THYROID    . PERCUTANEOUS NEPHROLITHOTOMY    . TOTAL KNEE ARTHROPLASTY  06/09/2012   Procedure: TOTAL KNEE ARTHROPLASTY;  Surgeon: Kathryne Hitch, MD;  Location: [facility];  Service: Orthopedics;  Laterality: Right;  Right Total Knee Arthroplasty  . TOTAL KNEE ARTHROPLASTY Left 04/09/2014   Procedure: LEFT TOTAL KNEE ARTHROPLASTY;  Surgeon: Kathryne Hitch, MD;  Location: [facility];  Service: Orthopedics;  Laterality: Left;  . TUBAL LIGATION     Family History  Problem Relation Age of Onset  . Heart disease Mother   . Stroke Father   . Heart disease Father   . Healthy Daughter   . Healthy Son    Social History   Occupational History  . Not on file.   Social History Main Topics  . Smoking status: Never Smoker  . Smokeless tobacco: Never Used  . Alcohol use No  . Drug use: No  . Sexual activity: Yes    Tobacco Counseling No tobacco use  Activities of Daily Living In your present state of health, do you have any difficulty performing the following activities: 03/16/2017  Hearing? N  Vision? N  Difficulty concentrating or making decisions? N  Walking or climbing stairs? N  Dressing or bathing? N  Doing errands, shopping? N  Preparing Food and eating ? N  Using the Toilet? N  In the past six months, have you accidently leaked urine? N  Do you have problems with loss of bowel control? N  Managing your Medications? N  Managing your Finances? N  Housekeeping or managing your Housekeeping? N  Some recent data might be hidden    Immunizations and Health Maintenance Immunization History  Administered Date(s) Administered  . Influenza, High Dose Seasonal PF 08/26/2016  . Influenza,inj,Quad PF,36+ Mos 08/28/2015  . Pneumococcal Conjugate-13 12/17/2014   Health Maintenance Due  Topic Date Due  . Hepatitis C Screening  1946/08/22  . COLON CANCER SCREENING ANNUAL FOBT  06/08/1996  . PNA vac Low Risk Adult (2 of 2 - PPSV23) 12/17/2015    . FOOT EXAM  06/11/2016  . HEMOGLOBIN A1C  10/22/2016  . OPHTHALMOLOGY EXAM  12/22/2016    Patient Care Team: Johna Sheriff, MD as PCP - General (Pediatrics) Kathryne Hitch, MD as Consulting Physician (Orthopedic Surgery) Barron Alvine, MD as Consulting Physician (Urology) Kerri Perches, MD as Attending Physician (Optometry)   Reports no hospitalizations, ER visits, or surgeries within the past year.    Assessment:   This is a routine wellness examination for Meredith Shaw.   Hearing/Vision screen No deficit noted during visit.  Eye exam within the past year.   Dietary issues and exercise activities discussed: Current Exercise Habits: Home exercise routine, Type of exercise: walking, Time (Minutes): 30, Frequency (Times/Week): 7, Weekly Exercise (Minutes/Week): 210, Intensity: Mild, Exercise limited by: None identified  Goals    . Exercise 3x per week (30 min per time)          Continue to walk to for at least 30 minutes daily. Try to increase to 45 minutes if as tolerated.      Depression Screen PHQ 2/9 Scores 04/21/2016 02/19/2016 12/17/2014  PHQ - 2 Score 0 0 3  PHQ- 9 Score - - 3    Fall Risk Fall Risk  04/21/2016 02/19/2016 12/17/2014  Falls in the past year? No No No  Patient moves slowly to avoid falls  Cognitive Function: MMSE - Mini Mental State Exam 03/16/2017  Orientation to time 5  Orientation to Place 5  Registration 3  Attention/ Calculation 5  Recall 3  Language- name 2 objects 2  Language- repeat 1  Language- follow 3 step command 3  Language- read & follow direction 1  Write a sentence 1  Copy design 1  Total score 30    Normal exam    Screening Tests Health Maintenance  Topic Date Due  . Hepatitis C Screening  October 23, 1945  . COLON CANCER SCREENING ANNUAL FOBT  06/08/1996  . PNA vac Low Risk Adult (2 of 2 - PPSV23) 12/17/2015  . FOOT EXAM  06/11/2016  . HEMOGLOBIN A1C  10/22/2016  . OPHTHALMOLOGY EXAM  12/22/2016  . INFLUENZA  VACCINE  05/11/2017  . MAMMOGRAM  02/01/2018  . TETANUS/TDAP  02/09/2019  . DEXA SCAN  Completed   No documentation of pneumovax but patient recalls having two pneumonia vaccines.   Plan:   FOBT given today. Return at next visit. Requested copy of Advance Directives Annual physical scheduled with Dr Oswaldo Done for 04/01/17. Will need A1C, microalbumin, and other routine labs Update Tdap if dog bite or dirty wound, otherwise it is due next year Shingrix would cost $164 today. Patient will check on cost at next visit. Recent eye exam with Dr Santiago Bumpers. We will request records  Declined  mammogram for now. Last one was 02/2016. Recommendations are for ever 2 years. Dexa due after 06/11/17    I have personally reviewed and noted the following in the patient's chart:   . Medical and social history . Use of alcohol, tobacco or illicit drugs  . Current medications and supplements . Functional ability and status . Nutritional status . Physical activity . Advanced directives . List of other physicians . Hospitalizations, surgeries, and ER visits in previous 12 months . Vitals . Screenings to include cognitive, depression, and falls . Referrals and appointments  In addition, I have reviewed and discussed with patient certain preventive protocols, quality metrics, and best practice recommendations. A written personalized care plan for preventive services as well as general preventive health recommendations were provided to patient.     Demetrios LollKristen Illene Sweeting, RN  03/16/2017   I have reviewed and agree with the above AWV documentation.   Rex Krasarol Vincent, MD [facility]

## 2017-04-01 ENCOUNTER — Ambulatory Visit (INDEPENDENT_AMBULATORY_CARE_PROVIDER_SITE_OTHER): Payer: Medicare Other | Admitting: Pediatrics

## 2017-04-01 ENCOUNTER — Encounter: Payer: Self-pay | Admitting: Pediatrics

## 2017-04-01 VITALS — BP 180/87 | HR 60 | Temp 97.5°F | Ht 65.75 in | Wt 174.8 lb

## 2017-04-01 DIAGNOSIS — I10 Essential (primary) hypertension: Secondary | ICD-10-CM | POA: Diagnosis not present

## 2017-04-01 DIAGNOSIS — Z0000 Encounter for general adult medical examination without abnormal findings: Secondary | ICD-10-CM | POA: Diagnosis not present

## 2017-04-01 DIAGNOSIS — E1169 Type 2 diabetes mellitus with other specified complication: Secondary | ICD-10-CM | POA: Diagnosis not present

## 2017-04-01 DIAGNOSIS — E119 Type 2 diabetes mellitus without complications: Secondary | ICD-10-CM

## 2017-04-01 DIAGNOSIS — E785 Hyperlipidemia, unspecified: Secondary | ICD-10-CM

## 2017-04-01 DIAGNOSIS — E039 Hypothyroidism, unspecified: Secondary | ICD-10-CM

## 2017-04-01 LAB — BAYER DCA HB A1C WAIVED: HB A1C: 7.2 % — AB (ref <7.0)

## 2017-04-01 NOTE — Progress Notes (Signed)
  Subjective:   Patient ID: Meredith Shaw, female    DOB: 12/05/1945, 71 y.o.   MRN: [id number] CC: Annual Exam  HPI: Meredith Shaw is a 71 y.o. female presenting for Annual Exam feeling well overall Trying to regularly walk No CP or SOB  HTN: says she checks at home but doesn't remember what the numbers are but thinks they were much better  DM2: checks BGLs at home, 120s-130s in the morning  Fam hx: no colon ca or breast ca in family  Relevant past medical, surgical, family and social history reviewed. Allergies and medications reviewed and updated. History  Smoking Status  . Never Smoker  Smokeless Tobacco  . Never Used   ROS: All systems neg other than what is in HPI  Objective:    BP (!) 180/87   Pulse 60   Temp 97.5 F (36.4 C) (Oral)   Ht 5' 5.75" (1.67 m)   Wt 174 lb 12.8 oz (79.3 kg)   BMI 28.43 kg/m   Wt Readings from Last 3 Encounters:  04/01/17 174 lb 12.8 oz (79.3 kg)  03/16/17 172 lb (78 kg)  04/21/16 173 lb 12.8 oz (78.8 kg)   Taken manually 160/85 BP   Gen: NAD, alert, cooperative with exam, NCAT EYES: EOMI, no conjunctival injection, or no icterus ENT:  TMs pearly gray b/l, OP without erythema LYMPH: no cervical LAD CV: NRRR, normal S1/S2, no murmur Resp: CTABL, no wheezes, normal WOB Abd: +BS, soft, NT, mildly distended. no guarding Ext: No edema, warm Neuro: Alert and oriented, strength equal b/l UE and LE, coordination grossly normal MSK: normal muscle bulk  Declined breast exam  Assessment & Plan:  Meredith Shaw was seen today for annual exam.  Diagnoses and all orders for this visit:  Encounter for preventive health examination -     Hepatitis C antibody  Type 2 diabetes mellitus without complication, without long-term current use of insulin (HCC) A1c improved, 7.2 Cont metformin -     Bayer DCA Hb A1c Waived -     Microalbumin / creatinine urine ratio  Essential hypertension Elevated today Cont current meds Check at home,  bring cuff and meds back to clinic -     CMP14+EGFR  Hypothyroidism, unspecified type Stable, check TSH, cont levothyroxine -     TSH  Hyperlipidemia associated with type 2 diabetes mellitus (HCC) Check lipid panel, will need statin with DM2 -     Lipid panel   Follow up plan: Return in about 4 weeks (around 04/29/2017) for htn f/u. Meredith Found, MD [place]

## 2017-04-01 NOTE — Patient Instructions (Signed)
Check blood pressure at home twice a day, bring cuff to clinic next visit

## 2017-04-02 LAB — LIPID PANEL
CHOL/HDL RATIO: 6.5 ratio — AB (ref 0.0–4.4)
Cholesterol, Total: 215 mg/dL — ABNORMAL HIGH (ref 100–199)
HDL: 33 mg/dL — ABNORMAL LOW (ref >39)
LDL Calculated: 123 mg/dL — ABNORMAL HIGH (ref 0–99)
TRIGLYCERIDES: 297 mg/dL — AB (ref 0–149)
VLDL Cholesterol Cal: 59 mg/dL — ABNORMAL HIGH (ref 5–40)

## 2017-04-02 LAB — TSH: TSH: 1.21 u[IU]/mL (ref 0.450–4.500)

## 2017-04-02 LAB — CMP14+EGFR
A/G RATIO: 1.6 (ref 1.2–2.2)
ALK PHOS: 74 IU/L (ref 39–117)
ALT: 11 IU/L (ref 0–32)
AST: 12 IU/L (ref 0–40)
Albumin: 4.2 g/dL (ref 3.5–4.8)
BILIRUBIN TOTAL: 0.2 mg/dL (ref 0.0–1.2)
BUN/Creatinine Ratio: 24 (ref 12–28)
BUN: 35 mg/dL — ABNORMAL HIGH (ref 8–27)
CHLORIDE: 106 mmol/L (ref 96–106)
CO2: 20 mmol/L (ref 20–29)
Calcium: 9.6 mg/dL (ref 8.7–10.3)
Creatinine, Ser: 1.46 mg/dL — ABNORMAL HIGH (ref 0.57–1.00)
GFR calc Af Amer: 42 mL/min/{1.73_m2} — ABNORMAL LOW (ref >59)
GFR calc non Af Amer: 36 mL/min/{1.73_m2} — ABNORMAL LOW (ref >59)
GLOBULIN, TOTAL: 2.6 g/dL (ref 1.5–4.5)
Glucose: 173 mg/dL — ABNORMAL HIGH (ref 65–99)
POTASSIUM: 4.7 mmol/L (ref 3.5–5.2)
SODIUM: 142 mmol/L (ref 134–144)
Total Protein: 6.8 g/dL (ref 6.0–8.5)

## 2017-04-02 LAB — MICROALBUMIN / CREATININE URINE RATIO
Creatinine, Urine: 74.5 mg/dL
MICROALB/CREAT RATIO: 294.1 mg/g{creat} — AB (ref 0.0–30.0)
Microalbumin, Urine: 219.1 ug/mL

## 2017-04-02 LAB — HEPATITIS C ANTIBODY: Hep C Virus Ab: <0.1 s/co ratio (ref 0.0–0.9)

## 2017-04-04 ENCOUNTER — Other Ambulatory Visit: Payer: Self-pay | Admitting: *Deleted

## 2017-04-04 MED ORDER — PRAVASTATIN SODIUM 40 MG PO TABS: 40.0000 mg | ORAL_TABLET | Freq: Every day | ORAL | 1 refills | Status: DC

## 2017-04-19 DIAGNOSIS — I83893 Varicose veins of bilateral lower extremities with other complications: Secondary | ICD-10-CM | POA: Diagnosis not present

## 2017-04-19 DIAGNOSIS — I83813 Varicose veins of bilateral lower extremities with pain: Secondary | ICD-10-CM | POA: Diagnosis not present

## 2017-04-20 ENCOUNTER — Other Ambulatory Visit: Payer: Self-pay | Admitting: Family Medicine

## 2017-04-25 DIAGNOSIS — I83813 Varicose veins of bilateral lower extremities with pain: Secondary | ICD-10-CM | POA: Diagnosis not present

## 2017-04-28 DIAGNOSIS — I83813 Varicose veins of bilateral lower extremities with pain: Secondary | ICD-10-CM | POA: Diagnosis not present

## 2017-04-28 DIAGNOSIS — I8312 Varicose veins of left lower extremity with inflammation: Secondary | ICD-10-CM | POA: Diagnosis not present

## 2017-04-28 DIAGNOSIS — I8311 Varicose veins of right lower extremity with inflammation: Secondary | ICD-10-CM | POA: Diagnosis not present

## 2017-05-04 ENCOUNTER — Ambulatory Visit (INDEPENDENT_AMBULATORY_CARE_PROVIDER_SITE_OTHER): Payer: Medicare Other | Admitting: Pediatrics

## 2017-05-04 ENCOUNTER — Encounter (INDEPENDENT_AMBULATORY_CARE_PROVIDER_SITE_OTHER): Payer: Self-pay

## 2017-05-04 ENCOUNTER — Encounter: Payer: Self-pay | Admitting: Pediatrics

## 2017-05-04 VITALS — BP 175/88 | HR 55 | Temp 97.1°F | Ht 65.75 in | Wt 172.6 lb

## 2017-05-04 DIAGNOSIS — I10 Essential (primary) hypertension: Secondary | ICD-10-CM

## 2017-05-04 MED ORDER — AMLODIPINE BESYLATE 10 MG PO TABS: 10.0000 mg | ORAL_TABLET | Freq: Every day | ORAL | 1 refills | Status: DC

## 2017-05-04 NOTE — Progress Notes (Signed)
  Subjective:   Patient ID: Meredith Shaw, female    DOB: [birth year], 71 y.o.   MRN: [id number] CC: Blood Pressure Check (4 weeks)  HPI: Meredith Shaw is a 71 y.o. female presenting for Blood Pressure Check (4 weeks)  BPs at home 120s/80s at lowest, past few days 140s-150s.70s-80s  No HA, no CP, no SOB  Taking meds daily  Relevant past medical, surgical, family and social history reviewed. Allergies and medications reviewed and updated. History  Smoking Status  . Never Smoker  Smokeless Tobacco  . Never Used   ROS: Per HPI   Objective:    BP (!) 175/88   Pulse (!) 55   Temp (!) 97.1 F (36.2 C) (Oral)   Ht 5' 5.75" (1.67 m)   Wt 172 lb 9.6 oz (78.3 kg)   BMI 28.07 kg/m   Wt Readings from Last 3 Encounters:  05/04/17 172 lb 9.6 oz (78.3 kg)  04/01/17 174 lb 12.8 oz (79.3 kg)  03/16/17 172 lb (78 kg)    Gen: NAD, alert, cooperative with exam, NCAT EYES: EOMI, no conjunctival injection, or no icterus ENT: OP without erythema CV: NRRR, normal S1/S2 Resp: CTABL, no wheezes, normal WOB Abd: +BS, soft, NTND. no guarding or organomegaly Ext: No edema, warm Neuro: Alert and oriented  Assessment & Plan:  Meredith Shaw was seen today for HTN  Diagnoses and all orders for this visit:  Essential hypertension Elevated today Increase amlodipine to 10mg  Cont other meds -     amLODipine (NORVASC) 10 MG tablet; Take 1 tablet (10 mg total) by mouth daily.   Follow up plan: Return in about 2 months (around 07/05/2017). for med f/u, DM2 f/u Rex Krasarol Youssef Footman, MD [facility]

## 2017-05-24 ENCOUNTER — Other Ambulatory Visit: Payer: Self-pay | Admitting: Family Medicine

## 2017-06-02 DIAGNOSIS — I83893 Varicose veins of bilateral lower extremities with other complications: Secondary | ICD-10-CM | POA: Diagnosis not present

## 2017-06-02 DIAGNOSIS — I83813 Varicose veins of bilateral lower extremities with pain: Secondary | ICD-10-CM | POA: Diagnosis not present

## 2017-07-02 ENCOUNTER — Other Ambulatory Visit: Payer: Self-pay | Admitting: Pediatrics

## 2017-07-05 ENCOUNTER — Encounter: Payer: Self-pay | Admitting: Pediatrics

## 2017-07-05 ENCOUNTER — Ambulatory Visit (INDEPENDENT_AMBULATORY_CARE_PROVIDER_SITE_OTHER): Payer: Medicare Other | Admitting: Pediatrics

## 2017-07-05 VITALS — BP 140/84 | HR 67 | Temp 97.5°F | Ht 65.75 in | Wt 176.8 lb

## 2017-07-05 DIAGNOSIS — E039 Hypothyroidism, unspecified: Secondary | ICD-10-CM | POA: Diagnosis not present

## 2017-07-05 DIAGNOSIS — N183 Chronic kidney disease, stage 3 unspecified: Secondary | ICD-10-CM

## 2017-07-05 DIAGNOSIS — I10 Essential (primary) hypertension: Secondary | ICD-10-CM

## 2017-07-05 DIAGNOSIS — Z23 Encounter for immunization: Secondary | ICD-10-CM

## 2017-07-05 DIAGNOSIS — E119 Type 2 diabetes mellitus without complications: Secondary | ICD-10-CM | POA: Diagnosis not present

## 2017-07-05 LAB — BAYER DCA HB A1C WAIVED: HB A1C: 8.1 % — AB (ref <7.0)

## 2017-07-05 NOTE — Progress Notes (Signed)
  Subjective:   Patient ID: Meredith Shaw, female    DOB: 1946/08/28, 71 y.o.   MRN: [id number] CC: Follow-up (2 month) med problems  HPI: Meredith Shaw is a 71 y.o. female presenting for Follow-up (2 month)  HTN: no CP or SOB Checks daily, BPs at home 110s-130s/70s-80s  DM2: on metformin Checks BGLs some, in 100s  HLD: taking cholesterol  Daily, no s/e  Wants to come back to get flu shot with husband later on this month  CKD: follows with Dr Isabel Caprice in [place]  Relevant past medical, surgical, family and social history reviewed. Allergies and medications reviewed and updated. History  Smoking Status  . Never Smoker  Smokeless Tobacco  . Never Used   ROS: Per HPI   Objective:    BP 140/84   Pulse 67   Temp (!) 97.5 F (36.4 C) (Oral)   Ht 5' 5.75" (1.67 m)   Wt 176 lb 12.8 oz (80.2 kg)   BMI 28.75 kg/m   Wt Readings from Last 3 Encounters:  07/05/17 176 lb 12.8 oz (80.2 kg)  05/04/17 172 lb 9.6 oz (78.3 kg)  04/01/17 174 lb 12.8 oz (79.3 kg)    Gen: NAD, alert, cooperative with exam, NCAT EYES: EOMI, no conjunctival injection, or no icterus ENT:  TMs pearly gray b/l, OP without erythema LYMPH: no cervical LAD CV: NRRR, normal S1/S2, no murmur, distal pulses 2+ b/l Resp: CTABL, no wheezes, normal WOB Abd: +BS, NTND. no guarding Ext: No edema, warm Neuro: Alert and oriented, strength equal b/l UE and LE, coordination grossly normal MSK: normal muscle bulk   Assessment & Plan:  Autie was seen today for follow-up multiple med problems  Diagnoses and all orders for this visit:  Type 2 diabetes mellitus without complication, without long-term current use of insulin (HCC) Cont metformin, check A1c -     Bayer DCA Hb A1c Waived -     Basic Metabolic Panel  Stage 3 chronic kidney disease Follows with nephrology -     Basic Metabolic Panel  Need for prophylactic vaccination against Streptococcus pneumoniae (pneumococcus) -     Pneumococcal  polysaccharide vaccine 23-valent greater than or equal to 2yo subcutaneous/IM  Essential hypertension Improved with recheck, much better at home No s/e Always has some elevation in clinic  Hypothyroidism, unspecified type Stable, cont current dose  Follow up plan: 3 mo Rex Kras, MD [place]

## 2017-07-06 LAB — BASIC METABOLIC PANEL
BUN/Creatinine Ratio: 17 (ref 12–28)
BUN: 23 mg/dL (ref 8–27)
CO2: 22 mmol/L (ref 20–29)
CREATININE: 1.36 mg/dL — AB (ref 0.57–1.00)
Calcium: 9.5 mg/dL (ref 8.7–10.3)
Chloride: 103 mmol/L (ref 96–106)
GFR, EST AFRICAN AMERICAN: 45 mL/min/{1.73_m2} — AB (ref >59)
GFR, EST NON AFRICAN AMERICAN: 39 mL/min/{1.73_m2} — AB (ref >59)
Glucose: 223 mg/dL — ABNORMAL HIGH (ref 65–99)
Potassium: 5.1 mmol/L (ref 3.5–5.2)
SODIUM: 142 mmol/L (ref 134–144)

## 2017-07-09 MED ORDER — SITAGLIPTIN PHOSPHATE 50 MG PO TABS: 50.0000 mg | ORAL_TABLET | Freq: Every day | ORAL | 3 refills | Status: DC

## 2017-07-09 NOTE — Addendum Note (Signed)
Addended by: Johna Sheriff on: 07/09/2017 09:27 AM   Modules accepted: Orders

## 2017-07-13 DIAGNOSIS — I8311 Varicose veins of right lower extremity with inflammation: Secondary | ICD-10-CM | POA: Diagnosis not present

## 2017-07-15 DIAGNOSIS — I8311 Varicose veins of right lower extremity with inflammation: Secondary | ICD-10-CM | POA: Diagnosis not present

## 2017-07-27 DIAGNOSIS — I83891 Varicose veins of right lower extremities with other complications: Secondary | ICD-10-CM | POA: Diagnosis not present

## 2017-07-27 DIAGNOSIS — I8311 Varicose veins of right lower extremity with inflammation: Secondary | ICD-10-CM | POA: Diagnosis not present

## 2017-08-09 ENCOUNTER — Other Ambulatory Visit: Payer: Self-pay | Admitting: Pediatrics

## 2017-08-09 DIAGNOSIS — I10 Essential (primary) hypertension: Secondary | ICD-10-CM

## 2017-08-10 DIAGNOSIS — I83812 Varicose veins of left lower extremities with pain: Secondary | ICD-10-CM | POA: Diagnosis not present

## 2017-08-10 DIAGNOSIS — I8312 Varicose veins of left lower extremity with inflammation: Secondary | ICD-10-CM | POA: Diagnosis not present

## 2017-08-16 ENCOUNTER — Ambulatory Visit (INDEPENDENT_AMBULATORY_CARE_PROVIDER_SITE_OTHER): Payer: Medicare Other

## 2017-08-16 DIAGNOSIS — Z23 Encounter for immunization: Secondary | ICD-10-CM | POA: Diagnosis not present

## 2017-08-17 DIAGNOSIS — S91301A Unspecified open wound, right foot, initial encounter: Secondary | ICD-10-CM | POA: Diagnosis not present

## 2017-08-25 DIAGNOSIS — I83891 Varicose veins of right lower extremities with other complications: Secondary | ICD-10-CM | POA: Diagnosis not present

## 2017-08-25 DIAGNOSIS — I8311 Varicose veins of right lower extremity with inflammation: Secondary | ICD-10-CM | POA: Diagnosis not present

## 2017-09-24 DIAGNOSIS — I83892 Varicose veins of left lower extremities with other complications: Secondary | ICD-10-CM | POA: Diagnosis not present

## 2017-09-24 DIAGNOSIS — I8312 Varicose veins of left lower extremity with inflammation: Secondary | ICD-10-CM | POA: Diagnosis not present

## 2017-09-27 DIAGNOSIS — I8312 Varicose veins of left lower extremity with inflammation: Secondary | ICD-10-CM | POA: Diagnosis not present

## 2017-10-03 ENCOUNTER — Other Ambulatory Visit: Payer: Self-pay | Admitting: Pediatrics

## 2017-10-12 DIAGNOSIS — I8312 Varicose veins of left lower extremity with inflammation: Secondary | ICD-10-CM | POA: Diagnosis not present

## 2017-10-12 DIAGNOSIS — I83892 Varicose veins of left lower extremities with other complications: Secondary | ICD-10-CM | POA: Diagnosis not present

## 2017-10-17 ENCOUNTER — Other Ambulatory Visit: Payer: Self-pay | Admitting: Pediatrics

## 2017-10-24 DIAGNOSIS — I8312 Varicose veins of left lower extremity with inflammation: Secondary | ICD-10-CM | POA: Diagnosis not present

## 2017-10-24 DIAGNOSIS — I83892 Varicose veins of left lower extremities with other complications: Secondary | ICD-10-CM | POA: Diagnosis not present

## 2017-11-04 DIAGNOSIS — I8312 Varicose veins of left lower extremity with inflammation: Secondary | ICD-10-CM | POA: Diagnosis not present

## 2017-11-21 DIAGNOSIS — I83892 Varicose veins of left lower extremities with other complications: Secondary | ICD-10-CM | POA: Diagnosis not present

## 2017-11-21 DIAGNOSIS — I8312 Varicose veins of left lower extremity with inflammation: Secondary | ICD-10-CM | POA: Diagnosis not present

## 2017-12-16 DIAGNOSIS — I8311 Varicose veins of right lower extremity with inflammation: Secondary | ICD-10-CM | POA: Diagnosis not present

## 2017-12-26 DIAGNOSIS — N200 Calculus of kidney: Secondary | ICD-10-CM | POA: Diagnosis not present

## 2017-12-26 DIAGNOSIS — N3020 Other chronic cystitis without hematuria: Secondary | ICD-10-CM | POA: Diagnosis not present

## 2017-12-30 ENCOUNTER — Telehealth: Payer: Self-pay | Admitting: Pediatrics

## 2017-12-30 NOTE — Telephone Encounter (Signed)
Appt made

## 2017-12-30 NOTE — Telephone Encounter (Signed)
Needs to be seen. Can you get records from Urology? Where is the pain? Fevers? Getting worse?

## 2017-12-31 ENCOUNTER — Encounter: Payer: Self-pay | Admitting: Family

## 2017-12-31 ENCOUNTER — Ambulatory Visit (INDEPENDENT_AMBULATORY_CARE_PROVIDER_SITE_OTHER): Payer: Medicare Other | Admitting: Family

## 2017-12-31 VITALS — BP 165/81 | HR 60 | Temp 97.3°F | Ht 66.0 in | Wt 172.0 lb

## 2017-12-31 DIAGNOSIS — N3001 Acute cystitis with hematuria: Secondary | ICD-10-CM | POA: Diagnosis not present

## 2017-12-31 DIAGNOSIS — R300 Dysuria: Secondary | ICD-10-CM

## 2017-12-31 MED ORDER — CEFTRIAXONE SODIUM 1 G IJ SOLR
500.0000 mg | Freq: Once | INTRAMUSCULAR | Status: AC
  Administered 2017-12-31: 500 mg via INTRAMUSCULAR

## 2017-12-31 NOTE — Progress Notes (Signed)
   Subjective:    Patient ID: Meredith Shaw, female    DOB: 06-01-1946, 72 y.o.   MRN: [id number]  PT presents to the office today with right flank pain and UTI. PT states she went to her Urologists for this pain on 12/26/17 , because it was very similar to the pain she has had with a kidney stone. She reports she had a CT scan that was negative for a stone. They did a urine culture that was positive [facility] She reports her pain was a 9 out 10, but is now a 4 out 10.  Urinary Tract Infection   This is a new problem. The current episode started in the past 7 days. The problem has been waxing and waning. The quality of the pain is described as burning. The pain is at a severity of 4/10. The pain is mild. There has been no fever. Associated symptoms include flank pain, frequency, nausea and urgency. Pertinent negatives include no hematuria, hesitancy or vomiting. She has tried antibiotics for the symptoms.  Flank Pain       Review of Systems  Gastrointestinal: Positive for nausea. Negative for vomiting.  Genitourinary: Positive for flank pain, frequency and urgency. Negative for hematuria and hesitancy.  All other systems reviewed and are negative.      Objective:   Physical Exam  Constitutional: She is oriented to person, place, and time. She appears well-developed and well-nourished. No distress.  HENT:  Head: Normocephalic and atraumatic.  Right Ear: External ear normal.  Mouth/Throat: Oropharynx is clear and moist.  Eyes: Pupils are equal, round, and reactive to light.  Neck: Normal range of motion. Neck supple. No thyromegaly present.  Cardiovascular: Normal rate, regular rhythm, normal heart sounds and intact distal pulses.  No murmur heard. Pulmonary/Chest: Effort normal and breath sounds normal. No respiratory distress. She has no wheezes.  Abdominal: Soft. Bowel sounds are normal. She exhibits no distension. There is tenderness (mild tenderness).    Musculoskeletal: Normal range of motion. She exhibits no edema or tenderness.  Neurological: She is alert and oriented to person, place, and time.  Skin: Skin is warm and dry.  Psychiatric: She has a normal mood and affect. Her behavior is normal. Judgment and thought content normal.  Vitals reviewed.     BP (!) 165/81   Pulse 60   Temp (!) 97.3 F (36.3 C) (Oral)   Ht 5\' 6"  (1.676 m)   Wt 172 lb (78 kg)   BMI 27.76 kg/m      Assessment & Plan:  1. Dysuria - Urinalysis  2. Acute cystitis with hematuria Continue Macrobid Will given Rocephin today  Force fluids RTO if symptoms worsen or do not improve - cefTRIAXone (ROCEPHIN) injection 500 mg   Jannifer Rodneyhristy Isa Hitz, FNP

## 2017-12-31 NOTE — Patient Instructions (Signed)

## 2018-01-02 ENCOUNTER — Other Ambulatory Visit: Payer: Self-pay | Admitting: Pediatrics

## 2018-01-02 LAB — URINALYSIS
Bilirubin, UA: NEGATIVE
GLUCOSE, UA: NEGATIVE
NITRITE UA: NEGATIVE
Specific Gravity, UA: 1.025 (ref 1.005–1.030)
UUROB: 0.2 mg/dL (ref 0.2–1.0)
pH, UA: 5.5 (ref 5.0–7.5)

## 2018-01-02 NOTE — Telephone Encounter (Signed)
Overdue med problem follow up, needs to be seen

## 2018-01-17 ENCOUNTER — Other Ambulatory Visit: Payer: Self-pay | Admitting: Pediatrics

## 2018-02-03 ENCOUNTER — Other Ambulatory Visit: Payer: Self-pay | Admitting: Pediatrics

## 2018-02-03 DIAGNOSIS — I10 Essential (primary) hypertension: Secondary | ICD-10-CM

## 2018-02-08 HISTORY — PX: OTHER SURGICAL HISTORY: SHX169

## 2018-02-09 ENCOUNTER — Inpatient Hospital Stay (HOSPITAL_COMMUNITY)
Admission: EM | Admit: 2018-02-09 | Discharge: 2018-02-14 | DRG: 481 | Disposition: A | Discharge status: Skilled Nursing Facility | Payer: Medicare Other | Attending: Internal Medicine | Admitting: Internal Medicine

## 2018-02-09 ENCOUNTER — Emergency Department (HOSPITAL_COMMUNITY): Payer: Medicare Other

## 2018-02-09 ENCOUNTER — Other Ambulatory Visit: Payer: Self-pay

## 2018-02-09 ENCOUNTER — Encounter (HOSPITAL_COMMUNITY): Payer: Self-pay

## 2018-02-09 DIAGNOSIS — S72002A Fracture of unspecified part of neck of left femur, initial encounter for closed fracture: Secondary | ICD-10-CM | POA: Diagnosis not present

## 2018-02-09 DIAGNOSIS — Z7982 Long term (current) use of aspirin: Secondary | ICD-10-CM | POA: Diagnosis not present

## 2018-02-09 DIAGNOSIS — E890 Postprocedural hypothyroidism: Secondary | ICD-10-CM | POA: Diagnosis present

## 2018-02-09 DIAGNOSIS — S72142A Displaced intertrochanteric fracture of left femur, initial encounter for closed fracture: Secondary | ICD-10-CM | POA: Diagnosis not present

## 2018-02-09 DIAGNOSIS — I10 Essential (primary) hypertension: Secondary | ICD-10-CM

## 2018-02-09 DIAGNOSIS — Z7984 Long term (current) use of oral hypoglycemic drugs: Secondary | ICD-10-CM | POA: Diagnosis not present

## 2018-02-09 DIAGNOSIS — T148XXA Other injury of unspecified body region, initial encounter: Secondary | ICD-10-CM | POA: Diagnosis not present

## 2018-02-09 DIAGNOSIS — S7292XA Unspecified fracture of left femur, initial encounter for closed fracture: Secondary | ICD-10-CM | POA: Diagnosis not present

## 2018-02-09 DIAGNOSIS — N183 Chronic kidney disease, stage 3 unspecified: Secondary | ICD-10-CM | POA: Diagnosis present

## 2018-02-09 DIAGNOSIS — B9562 Methicillin resistant Staphylococcus aureus infection as the cause of diseases classified elsewhere: Secondary | ICD-10-CM | POA: Diagnosis not present

## 2018-02-09 DIAGNOSIS — R739 Hyperglycemia, unspecified: Secondary | ICD-10-CM

## 2018-02-09 DIAGNOSIS — L8990 Pressure ulcer of unspecified site, unspecified stage: Secondary | ICD-10-CM

## 2018-02-09 DIAGNOSIS — Y92007 Garden or yard of unspecified non-institutional (private) residence as the place of occurrence of the external cause: Secondary | ICD-10-CM

## 2018-02-09 DIAGNOSIS — E039 Hypothyroidism, unspecified: Secondary | ICD-10-CM | POA: Diagnosis present

## 2018-02-09 DIAGNOSIS — R402413 Glasgow coma scale score 13-15, at hospital admission: Secondary | ICD-10-CM | POA: Diagnosis present

## 2018-02-09 DIAGNOSIS — N390 Urinary tract infection, site not specified: Secondary | ICD-10-CM | POA: Diagnosis present

## 2018-02-09 DIAGNOSIS — E782 Mixed hyperlipidemia: Secondary | ICD-10-CM | POA: Diagnosis not present

## 2018-02-09 DIAGNOSIS — I129 Hypertensive chronic kidney disease with stage 1 through stage 4 chronic kidney disease, or unspecified chronic kidney disease: Secondary | ICD-10-CM | POA: Diagnosis present

## 2018-02-09 DIAGNOSIS — E1122 Type 2 diabetes mellitus with diabetic chronic kidney disease: Secondary | ICD-10-CM | POA: Diagnosis not present

## 2018-02-09 DIAGNOSIS — Z79899 Other long term (current) drug therapy: Secondary | ICD-10-CM

## 2018-02-09 DIAGNOSIS — Z96653 Presence of artificial knee joint, bilateral: Secondary | ICD-10-CM | POA: Diagnosis present

## 2018-02-09 DIAGNOSIS — S79919A Unspecified injury of unspecified hip, initial encounter: Secondary | ICD-10-CM | POA: Diagnosis not present

## 2018-02-09 DIAGNOSIS — D62 Acute posthemorrhagic anemia: Secondary | ICD-10-CM | POA: Diagnosis not present

## 2018-02-09 DIAGNOSIS — Z880 Allergy status to penicillin: Secondary | ICD-10-CM

## 2018-02-09 DIAGNOSIS — I152 Hypertension secondary to endocrine disorders: Secondary | ICD-10-CM | POA: Diagnosis present

## 2018-02-09 DIAGNOSIS — M1990 Unspecified osteoarthritis, unspecified site: Secondary | ICD-10-CM | POA: Diagnosis not present

## 2018-02-09 DIAGNOSIS — I700 Atherosclerosis of aorta: Secondary | ICD-10-CM | POA: Diagnosis not present

## 2018-02-09 DIAGNOSIS — S72002S Fracture of unspecified part of neck of left femur, sequela: Secondary | ICD-10-CM | POA: Diagnosis not present

## 2018-02-09 DIAGNOSIS — W1830XA Fall on same level, unspecified, initial encounter: Secondary | ICD-10-CM | POA: Diagnosis present

## 2018-02-09 DIAGNOSIS — E1165 Type 2 diabetes mellitus with hyperglycemia: Secondary | ICD-10-CM | POA: Diagnosis not present

## 2018-02-09 DIAGNOSIS — E1159 Type 2 diabetes mellitus with other circulatory complications: Secondary | ICD-10-CM | POA: Diagnosis present

## 2018-02-09 DIAGNOSIS — S72009A Fracture of unspecified part of neck of unspecified femur, initial encounter for closed fracture: Secondary | ICD-10-CM | POA: Diagnosis present

## 2018-02-09 MED ORDER — FENTANYL CITRATE (PF) 100 MCG/2ML IJ SOLN
50.0000 ug | INTRAMUSCULAR | Status: DC | PRN
  Administered 2018-02-10: 50 ug via INTRAVENOUS
  Filled 2018-02-09: qty 2

## 2018-02-09 MED ORDER — SODIUM CHLORIDE 0.9 % IV SOLN
INTRAVENOUS | Status: DC
  Administered 2018-02-10 – 2018-02-12 (×2): via INTRAVENOUS

## 2018-02-09 MED ORDER — ONDANSETRON HCL 4 MG/2ML IJ SOLN
4.0000 mg | Freq: Once | INTRAMUSCULAR | Status: AC
  Administered 2018-02-10: 4 mg via INTRAVENOUS
  Filled 2018-02-09: qty 2

## 2018-02-09 NOTE — ED Provider Notes (Signed)
[facility] EMERGENCY DEPARTMENT Provider Note   CSN: [id number] Arrival date & time: 02/09/18  2253     History   Chief Complaint Chief Complaint  Patient presents with  . Fall    HPI Meredith Shaw is a 72 y.o. female.  The history is provided by the patient.  Fall  This is a new problem. Episode onset: Just prior to arrival. The problem occurs constantly. The problem has not changed since onset.Pertinent negatives include no chest pain, no abdominal pain, no headaches and no shortness of breath. The symptoms are aggravated by walking. The symptoms are relieved by rest. She has tried rest for the symptoms. The treatment provided mild relief.  pt presents after Mechanical fall.  She reports she was trying to vacuum in her car when she fell landing on her left hip.  No LOC.  No headache.  No neck pain.  No back pain.  Denies chest pain or abdominal pain. She felt well just prior to this happening She has had bilateral knee arthroplasty by Dr. Allie Bossier previously  Past Medical History:  Diagnosis Date  . Arthritis    OA AND PAIN BOTH KNEES -RIGHT KNEE HURTS WORSE THAN LEFT  . Diabetes mellitus    ORAL MEDICATION - NO INSULIN  . Hypertension   . Hypothyroidism   . Kidney stones    NONE AT PRESENT TIME THAT PT IS AWARE OF    Patient Active Problem List   Diagnosis Date Noted  . Chest pain 02/19/2016  . Arthritis of left knee 04/09/2014  . Status post total knee replacement 04/09/2014  . Arthropathy   . Diabetes (HCC)   . Hypertension   . Kidney stones   . Hypothyroidism   . Degenerative arthritis of knee 06/09/2012  . Flatulence, eructation, and gas pain 12/24/2005  . Abdominal or pelvic swelling, mass, or lump, other specified site 12/24/2005  . Osteoporosis 12/04/2004  . Symptomatic menopausal or female climacteric states 12/04/2004  . Arthropathy, lower leg 04/21/2004  . Hematuria 09/17/2003  . Other long term (current) drug therapy  07/05/2002  . Hyperlipidemia, mixed 03/01/2000    Past Surgical History:  Procedure Laterality Date  . ABDOMINAL HYSTERECTOMY    . CHOLECYSTECTOMY  1972  . COLONOSCOPY    . EYE SURGERY     RIGHT CATARACT EXTRACTON WITH LENS IMPLANT  . GROWTH REMOVED FROM THYROID    . PERCUTANEOUS NEPHROLITHOTOMY    . TOTAL KNEE ARTHROPLASTY  06/09/2012   Procedure: TOTAL KNEE ARTHROPLASTY;  Surgeon: Kathryne Hitch, MD;  Location: [facility] ORS;  Service: Orthopedics;  Laterality: Right;  Right Total Knee Arthroplasty  . TOTAL KNEE ARTHROPLASTY Left 04/09/2014   Procedure: LEFT TOTAL KNEE ARTHROPLASTY;  Surgeon: Kathryne Hitch, MD;  Location: [facility] OR;  Service: Orthopedics;  Laterality: Left;  . TUBAL LIGATION       OB History   None      Home Medications    Prior to Admission medications   Medication Sig Start Date End Date Taking? Authorizing Provider  amLODipine (NORVASC) 10 MG tablet TAKE ONE (1) TABLET EACH DAY 02/03/18   Johna Sheriff, MD  aspirin 81 MG tablet Take 81 mg by mouth daily.    [provider]  cholecalciferol (VITAMIN D) 1000 units tablet Take 1,000 Units by mouth daily.    [provider]  levothyroxine (SYNTHROID, LEVOTHROID) 137 MCG tablet TAKE ONE (1) TABLET EACH DAY 01/18/18   Junie Spencer, FNP  lisinopril (PRINIVIL,ZESTRIL) 20 MG tablet TAKE ONE (1) TABLET EACH DAY 01/18/18   Jannifer Rodney A, FNP  metFORMIN (GLUCOPHAGE) 1000 MG tablet TAKE ONE TABLET BY MOUTH TWICE DAILY 01/18/18   Jannifer Rodney A, FNP  metoprolol tartrate (LOPRESSOR) 100 MG tablet TAKE ONE TABLET BY MOUTH TWICE DAILY 05/24/17   Johna Sheriff, MD  pravastatin (PRAVACHOL) 40 MG tablet TAKE ONE TABLET DAILY AT BEDTIME 10/05/17   Johna Sheriff, MD    Family History Family History  Problem Relation Age of Onset  . Heart disease Mother   . Stroke Father   . Heart disease Father   . Healthy Daughter   . Healthy Son     Social History Social History   Tobacco Use    . Smoking status: Never Smoker  . Smokeless tobacco: Never Used  Substance Use Topics  . Alcohol use: No  . Drug use: No     Allergies   Penicillins   Review of Systems Review of Systems  Constitutional: Negative for fever.  Respiratory: Negative for shortness of breath.   Cardiovascular: Negative for chest pain.  Gastrointestinal: Negative for abdominal pain, diarrhea and vomiting.  Musculoskeletal: Positive for arthralgias. Negative for back pain and neck pain.  Neurological: Negative for headaches.  All other systems reviewed and are negative.    Physical Exam Updated Vital Signs BP (!) 175/94   Pulse 83   Temp 98.6 F (37 C) (Oral)   Resp 18   Ht 1.702 m ( )   Wt 77.1 kg (170 lb)   SpO2 99%   BMI 26.63 kg/m   Physical Exam CONSTITUTIONAL: elderly but no acute distress HEAD: Normocephalic/atraumatic EYES: EOMI ENMT: Mucous membranes moist NECK: supple no meningeal signs SPINE/BACK: No cervical spine tenderness CV: S1/S2 noted, no murmurs/rubs/gallops noted LUNGS: Lungs are clear to auscultation bilaterally, no apparent distress ABDOMEN: soft, nontender GU:no cva tenderness NEURO: Pt is awake/alert/appropriate, moves all extremitiesx4.  No facial droop.  Full range of motion of right leg.  Movement of left leg is limited due to hip pain.  She is able to wiggle toes on left foot without difficulty.  No sensory deficit EXTREMITIES: pulses normal/equal, left lower extremity is shortened and externally rotated.  Significant tenderness with range of motion left hip.  All other extremities/joints palpated/ranged and nontender SKIN: warm, color normal PSYCH: no abnormalities of mood noted, alert and oriented to situation   ED Treatments / Results  Labs (all labs ordered are listed, but only abnormal results are displayed) Labs Reviewed  BASIC METABOLIC PANEL - Abnormal; Notable for the following components:      Result Value   CO2 21 (*)    Glucose, Bld  330 (*)    Creatinine, Ser 1.23 (*)    GFR calc non Af Amer 43 (*)    GFR calc Af Amer 50 (*)    All other components within normal limits  CBC WITH DIFFERENTIAL/PLATELET - Abnormal; Notable for the following components:   WBC 16.7 (*)    Neutro Abs 14.5 (*)    All other components within normal limits  PROTIME-INR  CBG MONITORING, ED  TYPE AND SCREEN    EKG EKG Interpretation  Date/Time:  Friday Feb 10 2018 00:51:57 EDT Ventricular Rate:  77 PR Interval:    QRS Duration: 102 QT Interval:  424 QTC Calculation: 480 R Axis:   2 Text Interpretation:  Sinus rhythm Low voltage, precordial leads Interpretation limited secondary to artifact Confirmed by Zadie Rhine (  [id number]) on 02/10/2018 1:00:01 AM   Radiology Dg Chest 1 View  Result Date: 02/10/2018 CLINICAL DATA:  Hip fracture EXAM: CHEST  1 VIEW COMPARISON:  02/14/2016 FINDINGS: No focal opacity or pleural effusion. Borderline cardiomegaly. Mild aortic atherosclerosis. No pneumothorax. IMPRESSION: No active disease. Electronically Signed   By: Jasmine Pang M.D.   On: 02/10/2018 00:37   Dg Hip Unilat With Pelvis 2-3 Views Left  Result Date: 02/10/2018 CLINICAL DATA:  Left hip pain EXAM: DG HIP (WITH OR WITHOUT PELVIS) 2-3V LEFT COMPARISON:  None. FINDINGS: Calcified phleboliths in the pelvis. The SI joints are non widened. Mild right SI joint degenerative change. Pubic symphysis and rami appear intact. Both femoral heads project in joint. Acute comminuted left intertrochanteric fracture with displaced lesser trochanter fracture fragment. Moderate arthritis of the left hip with bony remodeling at the femoral head and neck junction. IMPRESSION: Acute comminuted and displaced left intertrochanteric fracture. Electronically Signed   By: Jasmine Pang M.D.   On: 02/10/2018 00:36    Procedures Procedures    Medications Ordered in ED Medications  0.9 %  sodium chloride infusion ( Intravenous New Bag/Given 02/10/18 0037)  fentaNYL  (SUBLIMAZE) injection 50 mcg (50 mcg Intravenous Given 02/10/18 0047)  ondansetron (ZOFRAN) injection 4 mg (4 mg Intravenous Given 02/10/18 0037)     Initial Impression / Assessment and Plan / ED Course  I have reviewed the triage vital signs and the nursing notes.  Pertinent labs & imaging results that were available during my care of the patient were reviewed by me and considered in my medical decision making (see chart for details).     11:44 PM Strong suspicion for left hip fracture.  Imaging pending 2:14 AM Discussed with Dr. Lajoyce Corners, with orthopedics.  We discussed the findings of left intertrochanteric fracture.  Patient is neurovascularly intact.  A physician from [place] orth[facility] will see patient later this morning.  We will keep patient n.p.o.  Unknown time for operative management.  Discussed with Dr. Toniann Fail for medical admission She is noted to have hyperglycemia, no anion gap, this will need to be addressed Final Clinical Impressions(s) / ED Diagnoses   Final diagnoses:  Closed left hip fracture, initial encounter [facility])  Hyperglycemia    ED Discharge Orders    None       Zadie Rhine, MD 02/10/18 [phone]

## 2018-02-09 NOTE — ED Triage Notes (Signed)
Pt arrives today from home, fell this PM outside, tripped on slippers. Unwittnessed, denies LOC, presents with R hip pain and R hip rotation and shortning. 22 In LAC, received 250 of NS en route. Pt is alert and oriented x4,

## 2018-02-10 ENCOUNTER — Encounter (HOSPITAL_COMMUNITY): Payer: Self-pay | Admitting: Internal Medicine

## 2018-02-10 ENCOUNTER — Other Ambulatory Visit: Payer: Self-pay

## 2018-02-10 ENCOUNTER — Inpatient Hospital Stay (HOSPITAL_COMMUNITY): Payer: Medicare Other | Admitting: Anesthesiology

## 2018-02-10 ENCOUNTER — Inpatient Hospital Stay (HOSPITAL_COMMUNITY): Payer: Medicare Other

## 2018-02-10 ENCOUNTER — Encounter (HOSPITAL_COMMUNITY)
Admission: EM | Disposition: A | Discharge status: Skilled Nursing Facility | Payer: Self-pay | Source: Home / Self Care | Attending: Internal Medicine

## 2018-02-10 DIAGNOSIS — S72002A Fracture of unspecified part of neck of left femur, initial encounter for closed fracture: Secondary | ICD-10-CM | POA: Diagnosis not present

## 2018-02-10 DIAGNOSIS — E1122 Type 2 diabetes mellitus with diabetic chronic kidney disease: Secondary | ICD-10-CM | POA: Diagnosis not present

## 2018-02-10 DIAGNOSIS — N390 Urinary tract infection, site not specified: Secondary | ICD-10-CM | POA: Diagnosis not present

## 2018-02-10 DIAGNOSIS — S72009A Fracture of unspecified part of neck of unspecified femur, initial encounter for closed fracture: Secondary | ICD-10-CM | POA: Diagnosis present

## 2018-02-10 DIAGNOSIS — Z7984 Long term (current) use of oral hypoglycemic drugs: Secondary | ICD-10-CM | POA: Diagnosis not present

## 2018-02-10 DIAGNOSIS — B9562 Methicillin resistant Staphylococcus aureus infection as the cause of diseases classified elsewhere: Secondary | ICD-10-CM | POA: Diagnosis present

## 2018-02-10 DIAGNOSIS — S72142A Displaced intertrochanteric fracture of left femur, initial encounter for closed fracture: Secondary | ICD-10-CM | POA: Diagnosis not present

## 2018-02-10 DIAGNOSIS — Z7982 Long term (current) use of aspirin: Secondary | ICD-10-CM | POA: Diagnosis not present

## 2018-02-10 DIAGNOSIS — S72142D Displaced intertrochanteric fracture of left femur, subsequent encounter for closed fracture with routine healing: Secondary | ICD-10-CM | POA: Diagnosis not present

## 2018-02-10 DIAGNOSIS — E1165 Type 2 diabetes mellitus with hyperglycemia: Secondary | ICD-10-CM | POA: Diagnosis present

## 2018-02-10 DIAGNOSIS — Y92007 Garden or yard of unspecified non-institutional (private) residence as the place of occurrence of the external cause: Secondary | ICD-10-CM | POA: Diagnosis not present

## 2018-02-10 DIAGNOSIS — W1830XA Fall on same level, unspecified, initial encounter: Secondary | ICD-10-CM | POA: Diagnosis present

## 2018-02-10 DIAGNOSIS — R739 Hyperglycemia, unspecified: Secondary | ICD-10-CM | POA: Diagnosis present

## 2018-02-10 DIAGNOSIS — Z880 Allergy status to penicillin: Secondary | ICD-10-CM | POA: Diagnosis not present

## 2018-02-10 DIAGNOSIS — L8990 Pressure ulcer of unspecified site, unspecified stage: Secondary | ICD-10-CM

## 2018-02-10 DIAGNOSIS — N183 Chronic kidney disease, stage 3 unspecified: Secondary | ICD-10-CM | POA: Diagnosis present

## 2018-02-10 DIAGNOSIS — E039 Hypothyroidism, unspecified: Secondary | ICD-10-CM | POA: Diagnosis not present

## 2018-02-10 DIAGNOSIS — Z79899 Other long term (current) drug therapy: Secondary | ICD-10-CM | POA: Diagnosis not present

## 2018-02-10 DIAGNOSIS — Z7401 Bed confinement status: Secondary | ICD-10-CM | POA: Diagnosis not present

## 2018-02-10 DIAGNOSIS — S72002S Fracture of unspecified part of neck of left femur, sequela: Secondary | ICD-10-CM | POA: Diagnosis not present

## 2018-02-10 DIAGNOSIS — I10 Essential (primary) hypertension: Secondary | ICD-10-CM | POA: Diagnosis not present

## 2018-02-10 DIAGNOSIS — E782 Mixed hyperlipidemia: Secondary | ICD-10-CM | POA: Diagnosis not present

## 2018-02-10 DIAGNOSIS — I129 Hypertensive chronic kidney disease with stage 1 through stage 4 chronic kidney disease, or unspecified chronic kidney disease: Secondary | ICD-10-CM | POA: Diagnosis not present

## 2018-02-10 DIAGNOSIS — I700 Atherosclerosis of aorta: Secondary | ICD-10-CM | POA: Diagnosis not present

## 2018-02-10 DIAGNOSIS — M2550 Pain in unspecified joint: Secondary | ICD-10-CM | POA: Diagnosis not present

## 2018-02-10 DIAGNOSIS — D62 Acute posthemorrhagic anemia: Secondary | ICD-10-CM | POA: Diagnosis not present

## 2018-02-10 DIAGNOSIS — R402413 Glasgow coma scale score 13-15, at hospital admission: Secondary | ICD-10-CM | POA: Diagnosis present

## 2018-02-10 DIAGNOSIS — Z96653 Presence of artificial knee joint, bilateral: Secondary | ICD-10-CM | POA: Diagnosis present

## 2018-02-10 DIAGNOSIS — M1990 Unspecified osteoarthritis, unspecified site: Secondary | ICD-10-CM | POA: Diagnosis not present

## 2018-02-10 DIAGNOSIS — W19XXXD Unspecified fall, subsequent encounter: Secondary | ICD-10-CM | POA: Diagnosis not present

## 2018-02-10 HISTORY — PX: INTRAMEDULLARY (IM) NAIL INTERTROCHANTERIC: SHX5875

## 2018-02-10 LAB — BASIC METABOLIC PANEL
ANION GAP: 14 (ref 5–15)
Anion gap: 12 (ref 5–15)
BUN: 20 mg/dL (ref 6–20)
BUN: 19 mg/dL (ref 6–20)
CHLORIDE: 104 mmol/L (ref 101–111)
CO2: 24 mmol/L (ref 22–32)
CO2: 21 mmol/L — AB (ref 22–32)
Calcium: 9.3 mg/dL (ref 8.9–10.3)
Calcium: 8.9 mg/dL (ref 8.9–10.3)
Chloride: 105 mmol/L (ref 101–111)
Creatinine, Ser: 1.23 mg/dL — ABNORMAL HIGH (ref 0.44–1.00)
Creatinine, Ser: 1.2 mg/dL — ABNORMAL HIGH (ref 0.44–1.00)
GFR calc Af Amer: 51 mL/min — ABNORMAL LOW (ref >60)
GFR, EST AFRICAN AMERICAN: 50 mL/min — AB (ref >60)
GFR, EST NON AFRICAN AMERICAN: 44 mL/min — AB (ref >60)
GFR, EST NON AFRICAN AMERICAN: 43 mL/min — AB (ref >60)
GLUCOSE: 308 mg/dL — AB (ref 65–99)
Glucose, Bld: 330 mg/dL — ABNORMAL HIGH (ref 65–99)
POTASSIUM: 4.8 mmol/L (ref 3.5–5.1)
Potassium: 4.2 mmol/L (ref 3.5–5.1)
SODIUM: 140 mmol/L (ref 135–145)
Sodium: 140 mmol/L (ref 135–145)

## 2018-02-10 LAB — CBC
HCT: 33.2 % — ABNORMAL LOW (ref 36.0–46.0)
Hemoglobin: 11.6 g/dL — ABNORMAL LOW (ref 12.0–15.0)
MCH: 30 pg (ref 26.0–34.0)
MCHC: 34.9 g/dL (ref 30.0–36.0)
MCV: 85.8 fL (ref 78.0–100.0)
PLATELETS: 222 10*3/uL (ref 150–400)
RBC: 3.87 MIL/uL (ref 3.87–5.11)
RDW: 13.3 % (ref 11.5–15.5)
WBC: 13.1 10*3/uL — ABNORMAL HIGH (ref 4.0–10.5)

## 2018-02-10 LAB — CBC WITH DIFFERENTIAL/PLATELET
BASOS ABS: 0 10*3/uL (ref 0.0–0.1)
BASOS PCT: 0 %
EOS ABS: 0 10*3/uL (ref 0.0–0.7)
Eosinophils Relative: 0 %
HEMATOCRIT: 37.5 % (ref 36.0–46.0)
HEMOGLOBIN: 13.1 g/dL (ref 12.0–15.0)
Lymphocytes Relative: 8 %
Lymphs Abs: 1.3 10*3/uL (ref 0.7–4.0)
MCH: 30 pg (ref 26.0–34.0)
MCHC: 34.9 g/dL (ref 30.0–36.0)
MCV: 86 fL (ref 78.0–100.0)
MONOS PCT: 5 %
Monocytes Absolute: 0.8 10*3/uL (ref 0.1–1.0)
NEUTROS ABS: 14.5 10*3/uL — AB (ref 1.7–7.7)
NEUTROS PCT: 87 %
Platelets: 219 10*3/uL (ref 150–400)
RBC: 4.36 MIL/uL (ref 3.87–5.11)
RDW: 13.2 % (ref 11.5–15.5)
WBC: 16.7 10*3/uL — AB (ref 4.0–10.5)

## 2018-02-10 LAB — TYPE AND SCREEN
ABO/RH(D): A POS
ANTIBODY SCREEN: NEGATIVE

## 2018-02-10 LAB — URINALYSIS, ROUTINE W REFLEX MICROSCOPIC
BILIRUBIN URINE: NEGATIVE
Glucose, UA: >=500 mg/dL — AB
KETONES UR: 5 mg/dL — AB
NITRITE: NEGATIVE
PROTEIN: 100 mg/dL — AB
Specific Gravity, Urine: 1.015 (ref 1.005–1.030)
pH: 5 (ref 5.0–8.0)

## 2018-02-10 LAB — GLUCOSE, CAPILLARY
GLUCOSE-CAPILLARY: 174 mg/dL — AB (ref 65–99)
GLUCOSE-CAPILLARY: 280 mg/dL — AB (ref 65–99)
Glucose-Capillary: 321 mg/dL — ABNORMAL HIGH (ref 65–99)
Glucose-Capillary: 256 mg/dL — ABNORMAL HIGH (ref 65–99)

## 2018-02-10 LAB — PROTIME-INR
INR: 0.98
PROTHROMBIN TIME: 12.9 s (ref 11.4–15.2)

## 2018-02-10 LAB — SURGICAL PCR SCREEN
MRSA, PCR: NEGATIVE
Staphylococcus aureus: NEGATIVE

## 2018-02-10 SURGERY — FIXATION, FRACTURE, INTERTROCHANTERIC, WITH INTRAMEDULLARY ROD
Anesthesia: General | Laterality: Left

## 2018-02-10 MED ORDER — METOCLOPRAMIDE HCL 5 MG PO TABS: 5.0000 mg | ORAL_TABLET | Freq: Three times a day (TID) | ORAL | Status: DC | PRN

## 2018-02-10 MED ORDER — METHOCARBAMOL 1000 MG/10ML IJ SOLN
500.0000 mg | Freq: Four times a day (QID) | INTRAVENOUS | Status: DC | PRN
  Filled 2018-02-10: qty 5

## 2018-02-10 MED ORDER — ACETAMINOPHEN 325 MG PO TABS: 325.0000 mg | ORAL_TABLET | Freq: Four times a day (QID) | ORAL | Status: DC | PRN

## 2018-02-10 MED ORDER — DEXAMETHASONE SODIUM PHOSPHATE 10 MG/ML IJ SOLN
INTRAMUSCULAR | Status: DC | PRN
  Administered 2018-02-10: 5 mg via INTRAVENOUS

## 2018-02-10 MED ORDER — CEFAZOLIN SODIUM 1 G IJ SOLR
INTRAMUSCULAR | Status: AC
  Filled 2018-02-10: qty 20

## 2018-02-10 MED ORDER — ROCURONIUM BROMIDE 50 MG/5ML IV SOLN
INTRAVENOUS | Status: AC
  Filled 2018-02-10: qty 1

## 2018-02-10 MED ORDER — ALUM & MAG HYDROXIDE-SIMETH 200-200-20 MG/5ML PO SUSP: 30.0000 mL | ORAL | Status: DC | PRN

## 2018-02-10 MED ORDER — ONDANSETRON HCL 4 MG/2ML IJ SOLN: 4.0000 mg | Freq: Four times a day (QID) | INTRAMUSCULAR | Status: DC | PRN

## 2018-02-10 MED ORDER — SCOPOLAMINE 1 MG/3DAYS TD PT72
MEDICATED_PATCH | TRANSDERMAL | Status: AC
  Filled 2018-02-10: qty 1

## 2018-02-10 MED ORDER — HYDROMORPHONE HCL 2 MG/ML IJ SOLN: 0.2500 mg | INTRAMUSCULAR | Status: DC | PRN

## 2018-02-10 MED ORDER — SUGAMMADEX SODIUM 200 MG/2ML IV SOLN
INTRAVENOUS | Status: AC
  Filled 2018-02-10: qty 2

## 2018-02-10 MED ORDER — MORPHINE SULFATE (PF) 2 MG/ML IV SOLN
1.0000 mg | INTRAVENOUS | Status: DC | PRN
  Administered 2018-02-10: 1 mg via INTRAVENOUS
  Administered 2018-02-10: 0.5 mg via INTRAVENOUS
  Administered 2018-02-10 (×3): 1 mg via INTRAVENOUS
  Filled 2018-02-10 (×5): qty 1

## 2018-02-10 MED ORDER — AMLODIPINE BESYLATE 10 MG PO TABS
10.0000 mg | ORAL_TABLET | Freq: Every day | ORAL | Status: DC
  Administered 2018-02-10 – 2018-02-14 (×5): 10 mg via ORAL
  Filled 2018-02-10: qty 2
  Filled 2018-02-10 (×4): qty 1

## 2018-02-10 MED ORDER — OXYCODONE HCL 5 MG PO TABS
10.0000 mg | ORAL_TABLET | ORAL | Status: DC | PRN
  Administered 2018-02-13: 10 mg via ORAL

## 2018-02-10 MED ORDER — EPHEDRINE SULFATE-NACL 50-0.9 MG/10ML-% IV SOSY
PREFILLED_SYRINGE | INTRAVENOUS | Status: DC | PRN
  Administered 2018-02-10 (×2): 10 mg via INTRAVENOUS
  Administered 2018-02-10 (×4): 5 mg via INTRAVENOUS
  Administered 2018-02-10: 10 mg via INTRAVENOUS

## 2018-02-10 MED ORDER — MIDAZOLAM HCL 2 MG/2ML IJ SOLN
INTRAMUSCULAR | Status: DC | PRN
  Administered 2018-02-10: 2 mg via INTRAVENOUS

## 2018-02-10 MED ORDER — MORPHINE SULFATE (PF) 4 MG/ML IV SOLN
0.5000 mg | INTRAVENOUS | Status: DC | PRN
  Administered 2018-02-10: 0.52 mg via INTRAVENOUS
  Filled 2018-02-10: qty 1

## 2018-02-10 MED ORDER — ASPIRIN EC 325 MG PO TBEC
325.0000 mg | DELAYED_RELEASE_TABLET | Freq: Every day | ORAL | Status: DC
  Administered 2018-02-11 – 2018-02-14 (×4): 325 mg via ORAL
  Filled 2018-02-10 (×4): qty 1

## 2018-02-10 MED ORDER — ONDANSETRON HCL 4 MG PO TABS: 4.0000 mg | ORAL_TABLET | Freq: Four times a day (QID) | ORAL | Status: DC | PRN

## 2018-02-10 MED ORDER — CEFAZOLIN SODIUM-DEXTROSE 2-3 GM-%(50ML) IV SOLR
INTRAVENOUS | Status: DC | PRN
  Administered 2018-02-10: 2 g via INTRAVENOUS

## 2018-02-10 MED ORDER — METOPROLOL TARTRATE 100 MG PO TABS
100.0000 mg | ORAL_TABLET | Freq: Two times a day (BID) | ORAL | Status: DC
  Administered 2018-02-10 – 2018-02-14 (×7): 100 mg via ORAL
  Filled 2018-02-10 (×6): qty 1
  Filled 2018-02-10: qty 4
  Filled 2018-02-10: qty 1

## 2018-02-10 MED ORDER — PANTOPRAZOLE SODIUM 40 MG PO TBEC
40.0000 mg | DELAYED_RELEASE_TABLET | Freq: Every day | ORAL | Status: DC
  Administered 2018-02-10 – 2018-02-14 (×5): 40 mg via ORAL
  Filled 2018-02-10 (×5): qty 1

## 2018-02-10 MED ORDER — PROPOFOL 10 MG/ML IV BOLUS
INTRAVENOUS | Status: DC | PRN
  Administered 2018-02-10: 120 mg via INTRAVENOUS

## 2018-02-10 MED ORDER — ROCURONIUM BROMIDE 10 MG/ML (PF) SYRINGE
PREFILLED_SYRINGE | INTRAVENOUS | Status: DC | PRN
  Administered 2018-02-10: 40 mg via INTRAVENOUS

## 2018-02-10 MED ORDER — ADULT MULTIVITAMIN W/MINERALS CH
1.0000 | ORAL_TABLET | Freq: Every day | ORAL | Status: DC
  Administered 2018-02-11 – 2018-02-14 (×4): 1 via ORAL
  Filled 2018-02-10 (×4): qty 1

## 2018-02-10 MED ORDER — LEVOTHYROXINE SODIUM 25 MCG PO TABS
137.0000 ug | ORAL_TABLET | Freq: Every day | ORAL | Status: DC
  Administered 2018-02-10 – 2018-02-14 (×5): 137 ug via ORAL
  Filled 2018-02-10 (×5): qty 1

## 2018-02-10 MED ORDER — METOCLOPRAMIDE HCL 5 MG/ML IJ SOLN: 5.0000 mg | Freq: Three times a day (TID) | INTRAMUSCULAR | Status: DC | PRN

## 2018-02-10 MED ORDER — ONDANSETRON HCL 4 MG/2ML IJ SOLN
INTRAMUSCULAR | Status: AC
  Filled 2018-02-10: qty 2

## 2018-02-10 MED ORDER — OXYCODONE HCL 5 MG PO TABS
5.0000 mg | ORAL_TABLET | ORAL | Status: DC | PRN
  Administered 2018-02-11 (×4): 10 mg via ORAL
  Administered 2018-02-12: 5 mg via ORAL
  Administered 2018-02-12 (×2): 10 mg via ORAL
  Administered 2018-02-12: 5 mg via ORAL
  Administered 2018-02-13: 10 mg via ORAL
  Filled 2018-02-10 (×5): qty 2
  Filled 2018-02-10 (×2): qty 1
  Filled 2018-02-10 (×3): qty 2

## 2018-02-10 MED ORDER — SCOPOLAMINE 1 MG/3DAYS TD PT72
MEDICATED_PATCH | TRANSDERMAL | Status: DC | PRN
  Administered 2018-02-10: 1 via TRANSDERMAL

## 2018-02-10 MED ORDER — DOCUSATE SODIUM 100 MG PO CAPS
100.0000 mg | ORAL_CAPSULE | Freq: Two times a day (BID) | ORAL | Status: DC
  Administered 2018-02-10 – 2018-02-14 (×8): 100 mg via ORAL
  Filled 2018-02-10 (×8): qty 1

## 2018-02-10 MED ORDER — MIDAZOLAM HCL 2 MG/2ML IJ SOLN
INTRAMUSCULAR | Status: AC
  Filled 2018-02-10: qty 2

## 2018-02-10 MED ORDER — MENTHOL 3 MG MT LOZG: 1.0000 | LOZENGE | OROMUCOSAL | Status: DC | PRN

## 2018-02-10 MED ORDER — LIDOCAINE 2% (20 MG/ML) 5 ML SYRINGE
INTRAMUSCULAR | Status: DC | PRN
  Administered 2018-02-10: 80 mg via INTRAVENOUS

## 2018-02-10 MED ORDER — SUGAMMADEX SODIUM 200 MG/2ML IV SOLN
INTRAVENOUS | Status: DC | PRN
  Administered 2018-02-10: 200 mg via INTRAVENOUS

## 2018-02-10 MED ORDER — DEXAMETHASONE SODIUM PHOSPHATE 10 MG/ML IJ SOLN
INTRAMUSCULAR | Status: AC
  Filled 2018-02-10: qty 1

## 2018-02-10 MED ORDER — FENTANYL CITRATE (PF) 250 MCG/5ML IJ SOLN
INTRAMUSCULAR | Status: DC | PRN
  Administered 2018-02-10: 50 ug via INTRAVENOUS
  Administered 2018-02-10: 100 ug via INTRAVENOUS

## 2018-02-10 MED ORDER — GLYCOPYRROLATE 0.2 MG/ML IV SOSY
PREFILLED_SYRINGE | INTRAVENOUS | Status: DC | PRN
  Administered 2018-02-10: .2 mg via INTRAVENOUS

## 2018-02-10 MED ORDER — HYDRALAZINE HCL 20 MG/ML IJ SOLN: 5.0000 mg | INTRAMUSCULAR | Status: DC | PRN

## 2018-02-10 MED ORDER — INSULIN ASPART 100 UNIT/ML ~~LOC~~ SOLN
0.0000 [IU] | SUBCUTANEOUS | Status: DC
  Administered 2018-02-10: 5 [IU] via SUBCUTANEOUS
  Administered 2018-02-10: 2 [IU] via SUBCUTANEOUS
  Administered 2018-02-10 (×2): 7 [IU] via SUBCUTANEOUS
  Administered 2018-02-11 (×2): 3 [IU] via SUBCUTANEOUS
  Administered 2018-02-11 (×2): 7 [IU] via SUBCUTANEOUS
  Administered 2018-02-11: 5 [IU] via SUBCUTANEOUS
  Administered 2018-02-11: 3 [IU] via SUBCUTANEOUS
  Administered 2018-02-12: 5 [IU] via SUBCUTANEOUS
  Administered 2018-02-12 – 2018-02-13 (×9): 3 [IU] via SUBCUTANEOUS
  Administered 2018-02-13: 2 [IU] via SUBCUTANEOUS
  Administered 2018-02-13 – 2018-02-14 (×2): 3 [IU] via SUBCUTANEOUS
  Administered 2018-02-14: 2 [IU] via SUBCUTANEOUS
  Administered 2018-02-14: 1 [IU] via SUBCUTANEOUS
  Administered 2018-02-14: 3 [IU] via SUBCUTANEOUS
  Administered 2018-02-14: 2 [IU] via SUBCUTANEOUS

## 2018-02-10 MED ORDER — PHENOL 1.4 % MT LIQD: 1.0000 | OROMUCOSAL | Status: DC | PRN

## 2018-02-10 MED ORDER — PROMETHAZINE HCL 25 MG/ML IJ SOLN: 6.2500 mg | INTRAMUSCULAR | Status: DC | PRN

## 2018-02-10 MED ORDER — SODIUM CHLORIDE 0.9 % IV SOLN
1.0000 g | INTRAVENOUS | Status: DC
  Administered 2018-02-10 – 2018-02-12 (×3): 1 g via INTRAVENOUS
  Filled 2018-02-10 (×2): qty 10
  Filled 2018-02-10: qty 1

## 2018-02-10 MED ORDER — ONDANSETRON HCL 4 MG/2ML IJ SOLN
INTRAMUSCULAR | Status: DC | PRN
  Administered 2018-02-10: 4 mg via INTRAVENOUS

## 2018-02-10 MED ORDER — DEXTROSE 5 % IV SOLN
INTRAVENOUS | Status: DC | PRN
  Administered 2018-02-10: 75 ug/min via INTRAVENOUS

## 2018-02-10 MED ORDER — HYDROMORPHONE HCL 2 MG/ML IJ SOLN: 0.5000 mg | INTRAMUSCULAR | Status: DC | PRN

## 2018-02-10 MED ORDER — EPHEDRINE 5 MG/ML INJ
INTRAVENOUS | Status: AC
  Filled 2018-02-10: qty 10

## 2018-02-10 MED ORDER — LACTATED RINGERS IV SOLN
INTRAVENOUS | Status: DC
  Administered 2018-02-10 (×2): via INTRAVENOUS

## 2018-02-10 MED ORDER — PHENYLEPHRINE 40 MCG/ML (10ML) SYRINGE FOR IV PUSH (FOR BLOOD PRESSURE SUPPORT)
PREFILLED_SYRINGE | INTRAVENOUS | Status: AC
  Filled 2018-02-10: qty 10

## 2018-02-10 MED ORDER — FENTANYL CITRATE (PF) 250 MCG/5ML IJ SOLN
INTRAMUSCULAR | Status: AC
  Filled 2018-02-10: qty 5

## 2018-02-10 MED ORDER — METHOCARBAMOL 500 MG PO TABS
500.0000 mg | ORAL_TABLET | Freq: Four times a day (QID) | ORAL | Status: DC | PRN
Start: 2018-02-10 — End: 2018-02-14
  Administered 2018-02-11 – 2018-02-13 (×8): 500 mg via ORAL
  Filled 2018-02-10 (×8): qty 1

## 2018-02-10 SURGICAL SUPPLY — 47 items
BIT DRILL LAG SCREW (DRILL) IMPLANT
BLADE SURG 15 STRL LF DISP TIS (BLADE) ×1 IMPLANT
BLADE SURG 15 STRL SS (BLADE) ×3
BNDG GAUZE ELAST 4 BULKY (GAUZE/BANDAGES/DRESSINGS) ×3 IMPLANT
COVER PERINEAL POST (MISCELLANEOUS) ×3 IMPLANT
COVER SURGICAL LIGHT HANDLE (MISCELLANEOUS) ×3 IMPLANT
DRAPE STERI IOBAN 125X83 (DRAPES) ×3 IMPLANT
DRILL LAG SCREW (DRILL) ×3
DRSG MEPILEX BORDER 4X4 (GAUZE/BANDAGES/DRESSINGS) ×2 IMPLANT
DRSG MEPILEX BORDER 4X8 (GAUZE/BANDAGES/DRESSINGS) ×3 IMPLANT
DRSG PAD ABDOMINAL 8X10 ST (GAUZE/BANDAGES/DRESSINGS) ×6 IMPLANT
DURAPREP 26ML APPLICATOR (WOUND CARE) ×3 IMPLANT
ELECT REM PT RETURN 9FT ADLT (ELECTROSURGICAL) ×3
ELECTRODE REM PT RTRN 9FT ADLT (ELECTROSURGICAL) ×1 IMPLANT
FACESHIELD WRAPAROUND (MASK) ×3 IMPLANT
FACESHIELD WRAPAROUND OR TEAM (MASK) ×1 IMPLANT
GAUZE XEROFORM 5X9 LF (GAUZE/BANDAGES/DRESSINGS) ×3 IMPLANT
GLOVE BIO SURGEON STRL SZ8 (GLOVE) ×3 IMPLANT
GLOVE BIOGEL PI IND STRL 8 (GLOVE) ×1 IMPLANT
GLOVE BIOGEL PI INDICATOR 8 (GLOVE) ×2
GLOVE ORTHO TXT STRL SZ7.5 (GLOVE) ×3 IMPLANT
GOWN STRL REUS W/ TWL LRG LVL3 (GOWN DISPOSABLE) ×1 IMPLANT
GOWN STRL REUS W/ TWL XL LVL3 (GOWN DISPOSABLE) ×2 IMPLANT
GOWN STRL REUS W/TWL LRG LVL3 (GOWN DISPOSABLE) ×3
GOWN STRL REUS W/TWL XL LVL3 (GOWN DISPOSABLE) ×6
GUIDEPIN 3.2X17.5 THRD DISP (PIN) ×2 IMPLANT
HIP FRA NAIL LAG SCREW 10.5X90 (Orthopedic Implant) ×3 IMPLANT
HIP FRAC NAIL LEFT 11X360MM (Orthopedic Implant) ×3 IMPLANT
KIT BASIN OR (CUSTOM PROCEDURE TRAY) ×3 IMPLANT
KIT TURNOVER KIT B (KITS) ×3 IMPLANT
LINER BOOT UNIVERSAL DISP (MISCELLANEOUS) ×3 IMPLANT
MANIFOLD NEPTUNE II (INSTRUMENTS) ×3 IMPLANT
NAIL HIP FRAC LEFT 11X360MM (Orthopedic Implant) IMPLANT
NS IRRIG 1000ML POUR BTL (IV SOLUTION) ×3 IMPLANT
PACK GENERAL/GYN (CUSTOM PROCEDURE TRAY) ×3 IMPLANT
PAD ARMBOARD 7.5X6 YLW CONV (MISCELLANEOUS) ×6 IMPLANT
PAD CAST 4YDX4 CTTN HI CHSV (CAST SUPPLIES) ×2 IMPLANT
PADDING CAST COTTON 4X4 STRL (CAST SUPPLIES) ×6
SCREW LAG HIP FRA NAIL 10.5X90 (Orthopedic Implant) IMPLANT
STAPLER VISISTAT 35W (STAPLE) ×3 IMPLANT
SUT VIC AB 0 CT1 27 (SUTURE) ×6
SUT VIC AB 0 CT1 27XBRD ANBCTR (SUTURE) ×2 IMPLANT
SUT VIC AB 2-0 CT1 27 (SUTURE) ×6
SUT VIC AB 2-0 CT1 TAPERPNT 27 (SUTURE) ×2 IMPLANT
TOWEL OR 17X24 6PK STRL BLUE (TOWEL DISPOSABLE) ×3 IMPLANT
TOWEL OR 17X26 10 PK STRL BLUE (TOWEL DISPOSABLE) ×3 IMPLANT
WATER STERILE IRR 1000ML POUR (IV SOLUTION) ×3 IMPLANT

## 2018-02-10 NOTE — Anesthesia Procedure Notes (Signed)
Procedure Name: Intubation Date/Time: 02/10/2018 3:12 PM Performed by: Valda Favia, CRNA Pre-anesthesia Checklist: Patient identified, Emergency Drugs available, Suction available and Patient being monitored Patient Re-evaluated:Patient Re-evaluated prior to induction Oxygen Delivery Method: Circle System Utilized Preoxygenation: Pre-oxygenation with 100% oxygen Induction Type: IV induction Ventilation: Mask ventilation without difficulty and Oral airway inserted - appropriate to patient size Laryngoscope Size: Mac and 4 Grade View: Grade II Tube type: Oral Tube size: 7.0 mm Number of attempts: 1 Airway Equipment and Method: Stylet and Oral airway Placement Confirmation: ETT inserted through vocal cords under direct vision,  positive ETCO2 and breath sounds checked- equal and bilateral Secured at: 21 cm Tube secured with: Tape Dental Injury: Teeth and Oropharynx as per pre-operative assessment

## 2018-02-10 NOTE — H&P (Addendum)
Progress Note         PROGRESS NOTE  Meredith Shaw QIO:[id number] DOB: 05-19-1946 DOA: 02/09/2018 PCP: Johna Sheriff, MD  HPI/Recap of past 24 hours:   HPI: Meredith Shaw is a 73 y.o. female with history of diabetes mellitus type 2, hypertension, hyperlipidemia, hypothyroidism and a fall at home while trying to clean her yard.  Patient denies hitting her head or losing consciousness.  Since patient had persistent pain in the left hip patient came to the ER.  Denies any chest pain shortness of breath or palpitations.  She underwent right hip surgery today     Assessment/Plan: Principal Problem:   Closed left hip fracture (HCC) Active Problems:   Hypertension   Hypothyroidism   Hyperlipidemia, mixed   CKD (chronic kidney disease) stage 3, GFR 30-59 ml/min (HCC)   Controlled type 2 diabetes mellitus with hyperglycemia (HCC)   Hip fracture (HCC)   Pressure injury of skin     1. Left hip with displaced intertrochanteric proximal femur fracture status post mechanical fall -patient is status post left hip surgery with screws and rods placement 2. Hypothyroidism on Synthroid. 3. Hypertension on metoprolol which be continued along with amlodipine.  Will hold lisinopril and continue after surgery.  PRN IV hydralazine. 4. Leukocytosis could be from stress on likely UTI.  Patient is on ceftriaxone.  Follow urine cultures.  Follow CBC. 5. Possible chronic kidney disease stage III follow metabolic panel. 6. Diabetes mellitus type 2 uncontrolled with hyperglycemia -for now I have kept patient on fluids and sliding scale coverage.  Closely follow CBGs.  Follow metabolic panel.  May need long-acting insulin if sugars tend to be remain high.      DVT prophylaxis: SCDs in anticipation of procedure. Code Status: Full code. Family Communication: Discussed with patient. Disposition Plan: Likely SNF. Consults called: Orthopedics   Procedures:  Left hip  surgery  Antimicrobials:  none  Objective: Vitals:   02/10/18 1700 02/10/18 1711 02/10/18 1737 02/10/18 2039  BP: (!) 152/86  (!) 143/89 (!) 115/57  Pulse: 85 83 85 92  Resp: (!) Temp:  98.4 F (36.9 C) (!) 97.3 F (36.3 C) 98 F (36.7 C)  TempSrc:   Oral Oral  SpO2: 99% 98% 97% 96%  Weight:      Height:        Intake/Output Summary (Last 24 hours) at 02/10/2018 2149 Last data filed at 02/10/2018 1628 Gross per 24 hour  Intake 1162.5 ml  Output 100 ml  Net 1062.5 ml   Filed Weights   02/09/18 2253 02/10/18 1329  Weight: 77.1 kg (170 lb) 77.1 kg (170 lb)   Body mass index is 26.62 kg/m.  Exam:   Eyes: Anicteric no pallor.  ENMT: No discharge from the ears eyes nose or mouth. Neck: No mass palpated no JVD appreciated. Respiratory: No rhonchi or crepitations. Cardiovascular: S1-S2 heard no murmurs appreciated. Abdomen: Soft nontender bowel sounds present. Musculoskeletal: No edema.  Pain on moving left hip. Skin: No rash. Neurologic: Alert awake oriented to time place and person.  Moves all extremities.   Psychiatric: Appears normal.  Awake alert and oriented she is sipping on ginger ale    Data Reviewed: CBC: Recent Labs  Lab 02/10/18 0028 02/10/18 0506  WBC 16.7* 13.1*  NEUTROABS 14.5*  --   HGB 13.1 11.6*  HCT 37.5 33.2*  MCV 86.0 85.8  PLT 219 222   Basic Metabolic Panel: Recent Labs  Lab 02/10/18  0028 02/10/18 0506  NA 140 140  K 4.2 4.8  CL 105 104  CO2 21* 24  GLUCOSE 330* 308*  BUN 20 19  CREATININE 1.23* 1.20*  CALCIUM 9.3 8.9   GFR: Estimated Creatinine Clearance: 46 mL/min (A) (by C-G formula based on SCr of 1.2 mg/dL (H)). Liver Function Tests: No results for input(s): AST, ALT, ALKPHOS, BILITOT, PROT, ALBUMIN in the last 168 hours. No results for input(s): LIPASE, AMYLASE in the last 168 hours. No results for input(s): AMMONIA in the last 168 hours. Coagulation Profile: Recent Labs  Lab 02/10/18 0028  INR 0.98    Cardiac Enzymes: No results for input(s): CKTOTAL, CKMB, CKMBINDEX, TROPONINI in the last 168 hours. BNP (last 3 results) No results for input(s): PROBNP in the last 8760 hours. HbA1C: No results for input(s): HGBA1C in the last 72 hours. CBG: Recent Labs  Lab 02/10/18 0409 02/10/18 1205 02/10/18 1741 02/10/18 2041  GLUCAP 321* 174* 256* 280*   Lipid Profile: No results for input(s): CHOL, HDL, LDLCALC, TRIG, CHOLHDL, LDLDIRECT in the last 72 hours. Thyroid Function Tests: No results for input(s): TSH, T4TOTAL, FREET4, T3FREE, THYROIDAB in the last 72 hours. Anemia Panel: No results for input(s): VITAMINB12, FOLATE, FERRITIN, TIBC, IRON, RETICCTPCT in the last 72 hours. Urine analysis:    Component Value Date/Time   COLORURINE YELLOW 02/10/2018 0533   APPEARANCEUR HAZY (A) 02/10/2018 0533   APPEARANCEUR Clear 12/31/2017 0829   LABSPEC 1.015 02/10/2018 0533   PHURINE 5.0 02/10/2018 0533   GLUCOSEU >=500 (A) 02/10/2018 0533   HGBUR MODERATE (A) 02/10/2018 0533   BILIRUBINUR NEGATIVE 02/10/2018 0533   BILIRUBINUR Negative 12/31/2017 0829   KETONESUR 5 (A) 02/10/2018 0533   PROTEINUR 100 (A) 02/10/2018 0533   UROBILINOGEN 0.2 04/02/2014 1424   NITRITE NEGATIVE 02/10/2018 0533   LEUKOCYTESUR LARGE (A) 02/10/2018 0533   LEUKOCYTESUR 2+ (A) 12/31/2017 0829   Sepsis Labs: (procalcitonin:4,lacticidven:4)  ) Recent Results (from the past 240 hour(s))  Surgical pcr screen     Status: None   Collection Time: 02/10/18  5:33 AM  Result Value Ref Range Status   MRSA, PCR NEGATIVE NEGATIVE Final   Staphylococcus aureus NEGATIVE NEGATIVE Final    Comment: (NOTE) The Xpert SA Assay (FDA approved for NASAL specimens in patients 73 years of age and older), is one component of a comprehensive surveillance program. It is not intended to diagnose infection nor to guide or monitor treatment. Performed at [facility], [address], [place], [place] [address]        Studies: Dg Chest 1 View  Result Date: 02/10/2018 CLINICAL DATA:  Hip fracture EXAM: CHEST  1 VIEW COMPARISON:  02/14/2016 FINDINGS: No focal opacity or pleural effusion. Borderline cardiomegaly. Mild aortic atherosclerosis. No pneumothorax. IMPRESSION: No active disease. Electronically Signed   By: Jasmine Pang M.D.   On: 02/10/2018 00:37   Pelvis Portable  Result Date: 02/10/2018 CLINICAL DATA:  71 year old female status post left femur ORIF for intertrochanteric fracture. EXAM: PORTABLE PELVIS 1-2 VIEWS COMPARISON:  Intraoperative images 15 54 hours today and earlier. FINDINGS: Portable AP supine views at 1637 hours. Left femur intramedullary rod redemonstrated with proximal interlocking dynamic hip screw. These traverse the comminuted left femur intertrochanteric fracture with stable appearing alignment to the intraoperative images. Superimposed left total knee arthroplasty. No new No acute osseous abnormality identified. Postoperative changes to the left hip and thigh soft tissues. IMPRESSION: 1. Left femur ORIF with no adverse hardware features. 2. No new osseous  abnormality identified. Electronically Signed   By: Odessa Fleming M.D.   On: 02/10/2018 18:55   Dg C-arm 1-60 Min  Result Date: 02/10/2018 CLINICAL DATA:  Left hip fracture fixation. EXAM: DG C-ARM 61-120 MIN; LEFT FEMUR 2 VIEWS COMPARISON:  Left femur x-rays dated Feb 09, 2018. FLUOROSCOPY TIME:  Fluoroscopy Time:  1 minutes, 36 seconds. FINDINGS: Multiple intraoperative x-rays demonstrate interval cephalomedullary rod fixation of the left intertrochanteric femur fracture. The lesser trochanter remains displaced. Alignment is otherwise anatomic. Moderate left hip osteoarthritis again noted. IMPRESSION: Left intertrochanteric femur fracture ORIF. Electronically Signed   By: Obie Dredge M.D.   On: 02/10/2018 16:20   Dg Hip Unilat With Pelvis 2-3 Views Left  Result Date: 02/10/2018 CLINICAL DATA:  Left hip pain EXAM: DG HIP (WITH OR  WITHOUT PELVIS) 2-3V LEFT COMPARISON:  None. FINDINGS: Calcified phleboliths in the pelvis. The SI joints are non widened. Mild right SI joint degenerative change. Pubic symphysis and rami appear intact. Both femoral heads project in joint. Acute comminuted left intertrochanteric fracture with displaced lesser trochanter fracture fragment. Moderate arthritis of the left hip with bony remodeling at the femoral head and neck junction. IMPRESSION: Acute comminuted and displaced left intertrochanteric fracture. Electronically Signed   By: Jasmine Pang M.D.   On: 02/10/2018 00:36   Dg Femur Min 2 Views Left  Result Date: 02/10/2018 CLINICAL DATA:  Left hip fracture fixation. EXAM: DG C-ARM 61-120 MIN; LEFT FEMUR 2 VIEWS COMPARISON:  Left femur x-rays dated Feb 09, 2018. FLUOROSCOPY TIME:  Fluoroscopy Time:  1 minutes, 36 seconds. FINDINGS: Multiple intraoperative x-rays demonstrate interval cephalomedullary rod fixation of the left intertrochanteric femur fracture. The lesser trochanter remains displaced. Alignment is otherwise anatomic. Moderate left hip osteoarthritis again noted. IMPRESSION: Left intertrochanteric femur fracture ORIF. Electronically Signed   By: Obie Dredge M.D.   On: 02/10/2018 16:20    Scheduled Meds: . amLODipine  10 mg Oral Daily  . [START ON 02/11/2018] aspirin EC  325 mg Oral Q breakfast  . docusate sodium  100 mg Oral BID  . insulin aspart  0-9 Units Subcutaneous Q4H  . levothyroxine  137 mcg Oral QAC breakfast  . metoprolol tartrate  100 mg Oral BID  . [START ON 02/11/2018] multivitamin with minerals  1 tablet Oral Daily  . pantoprazole  40 mg Oral Daily    Continuous Infusions: . sodium chloride 75 mL/hr at 02/10/18 0037  . cefTRIAXone (ROCEPHIN)  IV Stopped (02/10/18 1045)  . lactated ringers 10 mL/hr at 02/10/18 1816  . methocarbamol (ROBAXIN)  IV       LOS: 0 days     Myrtie Neither, MD [facility]  To reach me or the doctor on call, go  to: [email or web address] Password [email or web address]  02/10/2018, 9:49 PM

## 2018-02-10 NOTE — Anesthesia Preprocedure Evaluation (Signed)
Anesthesia Evaluation  Patient identified by MRN, date of birth, ID band Patient awake    Reviewed: Allergy & Precautions, NPO status , Patient's Chart, lab work & pertinent test results  Airway Mallampati: II  TM Distance: >3 FB Neck ROM: Full    Dental no notable dental hx.    Pulmonary neg pulmonary ROS,    Pulmonary exam normal breath sounds clear to auscultation       Cardiovascular hypertension, negative cardio ROS Normal cardiovascular exam Rhythm:Regular Rate:Normal     Neuro/Psych negative neurological ROS  negative psych ROS   GI/Hepatic negative GI ROS, Neg liver ROS,   Endo/Other  diabetesHypothyroidism   Renal/GU negative Renal ROS  negative genitourinary   Musculoskeletal negative musculoskeletal ROS (+)   Abdominal   Peds negative pediatric ROS (+)  Hematology  (+) anemia ,   Anesthesia Other Findings   Reproductive/Obstetrics negative OB ROS                             Anesthesia Physical Anesthesia Plan  ASA: II  Anesthesia Plan: General   Post-op Pain Management:    Induction: Intravenous  PONV Risk Score and Plan: 3 and Ondansetron, Dexamethasone and Treatment may vary due to age or medical condition  Airway Management Planned: Oral ETT  Additional Equipment:   Intra-op Plan:   Post-operative Plan: Extubation in OR  Informed Consent: I have reviewed the patients History and Physical, chart, labs and discussed the procedure including the risks, benefits and alternatives for the proposed anesthesia with the patient or authorized representative who has indicated his/her understanding and acceptance.   Dental advisory given  Plan Discussed with: CRNA and Surgeon  Anesthesia Plan Comments:         Anesthesia Quick Evaluation

## 2018-02-10 NOTE — Progress Notes (Signed)
Inpatient Diabetes Program Recommendations  AACE/ADA: New Consensus Statement on Inpatient Glycemic Control (2015)  Target Ranges:  Prepandial:   less than 140 mg/dL      Peak postprandial:   less than 180 mg/dL (1-2 hours)      Critically ill patients:  140 - 180 mg/dL   Lab Results  Component Value Date   GLUCAP 321 (H) 02/10/2018   HGBA1C 6.6 06/12/2015   Review of Glycemic Control  Diabetes history: DM 2 Outpatient Diabetes medications: Metformin 1000 mg BID Current orders for Inpatient glycemic control: Novolog Sensitive Correction 0-9 units Q4 hours  Inpatient Diabetes Program Recommendations:    Note am glucose not loaded in Epic for 8 am. RN covered 321 glucose with Novolog 7 units. Glucose trends decreased to 174 mg/dl at lunchtime. Will watch trends on current trend. Once patient is on a diet may need titration of insulin.  Thanks,  Christena Deem RN, MSN, BC-ADM, [facility] Inpatient Diabetes Coordinator Team Pager [phone] (8a-5p)

## 2018-02-10 NOTE — Brief Op Note (Signed)
02/10/2018  4:20 PM  PATIENT:  Meredith Shaw  72 y.o. female  PRE-OPERATIVE DIAGNOSIS:  LEFT HIP FRACTURE  POST-OPERATIVE DIAGNOSIS:  LEFT HIP FRACTURE  PROCEDURE:  Procedure(s): INTRAMEDULLARY (IM) NAIL INTERTROCHANTRIC (Left)  SURGEON:  Surgeon(s) and Role:    Kathryne Hitch, MD - Primary  PHYSICIAN ASSISTANT: Rexene Edison, PA-C  ANESTHESIA:   general  EBL:  100 mL   COUNTS:  YES  DICTATION: .Other Dictation: Dictation Number [id number]  PLAN OF CARE: Admit to inpatient   PATIENT DISPOSITION:  PACU - hemodynamically stable.   Delay start of Pharmacological VTE agent (>24hrs) due to surgical blood loss or risk of bleeding: no

## 2018-02-10 NOTE — Progress Notes (Signed)
PROGRESS NOTE  Meredith Shaw ZOX:[id number] DOB: 02/11/1946 DOA: 02/09/2018 PCP: Johna Sheriff, MD  HPI/Recap of past 24 hours:  Shaw:BJYNW S Lawrenceis a 72 y.o.femalewithhistory of diabetes mellitus type 2, hypertension, hyperlipidemia, hypothyroidism and a fall at home while trying to clean her yard. Patient denies hitting her head or losing consciousness. Since patient had persistent pain in the left hip patient came to the ER. Denies any chest pain shortness of breath or palpitations.  She underwent right hip surgery today      Assessment/Plan: Principal Problem:   Closed left hip fracture (HCC) Active Problems:   Hypertension   Hypothyroidism   Hyperlipidemia, mixed   CKD (chronic kidney disease) stage 3, GFR 30-59 ml/min (HCC)   Controlled type 2 diabetes mellitus with hyperglycemia (HCC)   Hip fracture (HCC)   Pressure injury of skin   1. Left hip with displaced intertrochanteric proximal femur fracture status post mechanical fall-patient is status post left hip surgery with screws and rods placement 2. Hypothyroidism on Synthroid. 3. Hypertension on metoprolol which be continued along with amlodipine. Will hold lisinopril and continue after surgery. PRN IV hydralazine. 4. Leukocytosis could be from stress on likely UTI. Patient is on ceftriaxone. Follow urine cultures. Follow CBC. 5. Possible chronic kidney disease stage III follow metabolic panel. 6. Diabetes mellitus type 2 uncontrolled with hyperglycemia-for now I have kept patient on fluids and sliding scale coverage. Closely follow CBGs. Follow metabolic panel. May need long-acting insulin if sugars tend to be remain high.        DVT prophylaxis:SCDs in anticipation of procedure. Code Status:Full code. Family Communication:Discussed with patient. Disposition Plan:Likely SNF. Consults called:Orthopedics   Procedures:  Left hip  surgery  Antimicrobials:  none      Objective: Vitals:   02/10/18 1700 02/10/18 1711 02/10/18 1737 02/10/18 2039  BP: (!) 152/86  (!) 143/89 (!) 115/57  Pulse: 85 83 85 92  Resp: (!) Temp:  98.4 F (36.9 C) (!) 97.3 F (36.3 C) 98 F (36.7 C)  TempSrc:   Oral Oral  SpO2: 99% 98% 97% 96%  Weight:      Height:        Intake/Output Summary (Last 24 hours) at 02/10/2018 2205 Last data filed at 02/10/2018 1628 Gross per 24 hour  Intake 1162.5 ml  Output 100 ml  Net 1062.5 ml   Filed Weights   02/09/18 2253 02/10/18 1329  Weight: 77.1 kg (170 lb) 77.1 kg (170 lb)   Body mass index is 26.62 kg/m.  Exam:    Eyes:Anicteric no pallor. ENMT:No discharge from the ears eyes nose or mouth. Neck:No mass palpated no JVD appreciated. Respiratory:No rhonchi or crepitations. Cardiovascular:S1-S2 heard no murmurs appreciated. Abdomen:Soft nontender bowel sounds present. Musculoskeletal:No edema. Pain on moving left hip. Skin:No rash. Neurologic:Alert awake oriented to time place and person. Moves all extremities.  Psychiatric:Appears normal.  Awake alert and oriented she is sipping on ginger ale      Data Reviewed: CBC: Recent Labs  Lab 02/10/18 0028 02/10/18 0506  WBC 16.7* 13.1*  NEUTROABS 14.5*  --   HGB 13.1 11.6*  HCT 37.5 33.2*  MCV 86.0 85.8  PLT 219 222   Basic Metabolic Panel: Recent Labs  Lab 02/10/18 0028 02/10/18 0506  NA 140 140  K 4.2 4.8  CL 105 104  CO2 21* 24  GLUCOSE 330* 308*  BUN 20 19  CREATININE 1.23* 1.20*  CALCIUM 9.3 8.9   GFR: Estimated  Creatinine Clearance: 46 mL/min (A) (by C-G formula based on SCr of 1.2 mg/dL (H)). Liver Function Tests: No results for input(s): AST, ALT, ALKPHOS, BILITOT, PROT, ALBUMIN in the last 168 hours. No results for input(s): LIPASE, AMYLASE in the last 168 hours. No results for input(s): AMMONIA in the last 168 hours. Coagulation Profile: Recent Labs  Lab  02/10/18 0028  INR 0.98   Cardiac Enzymes: No results for input(s): CKTOTAL, CKMB, CKMBINDEX, TROPONINI in the last 168 hours. BNP (last 3 results) No results for input(s): PROBNP in the last 8760 hours. HbA1C: No results for input(s): HGBA1C in the last 72 hours. CBG: Recent Labs  Lab 02/10/18 0409 02/10/18 1205 02/10/18 1741 02/10/18 2041  GLUCAP 321* 174* 256* 280*   Lipid Profile: No results for input(s): CHOL, HDL, LDLCALC, TRIG, CHOLHDL, LDLDIRECT in the last 72 hours. Thyroid Function Tests: No results for input(s): TSH, T4TOTAL, FREET4, T3FREE, THYROIDAB in the last 72 hours. Anemia Panel: No results for input(s): VITAMINB12, FOLATE, FERRITIN, TIBC, IRON, RETICCTPCT in the last 72 hours. Urine analysis:    Component Value Date/Time   COLORURINE YELLOW 02/10/2018 0533   APPEARANCEUR HAZY (A) 02/10/2018 0533   APPEARANCEUR Clear 12/31/2017 0829   LABSPEC 1.015 02/10/2018 0533   PHURINE 5.0 02/10/2018 0533   GLUCOSEU >=500 (A) 02/10/2018 0533   HGBUR MODERATE (A) 02/10/2018 0533   BILIRUBINUR NEGATIVE 02/10/2018 0533   BILIRUBINUR Negative 12/31/2017 0829   KETONESUR 5 (A) 02/10/2018 0533   PROTEINUR 100 (A) 02/10/2018 0533   UROBILINOGEN 0.2 04/02/2014 1424   NITRITE NEGATIVE 02/10/2018 0533   LEUKOCYTESUR LARGE (A) 02/10/2018 0533   LEUKOCYTESUR 2+ (A) 12/31/2017 0829   Sepsis Labs: (procalcitonin:4,lacticidven:4)  ) Recent Results (from the past 240 hour(s))  Surgical pcr screen     Status: None   Collection Time: 02/10/18  5:33 AM  Result Value Ref Range Status   MRSA, PCR NEGATIVE NEGATIVE Final   Staphylococcus aureus NEGATIVE NEGATIVE Final    Comment: (NOTE) The Xpert SA Assay (FDA approved for NASAL specimens in patients 55 years of age and older), is one component of a comprehensive surveillance program. It is not intended to diagnose infection nor to guide or monitor treatment. Performed at [facility], [address]  [place], [place] [address]       Studies: Dg Chest 1 View  Result Date: 02/10/2018 CLINICAL DATA:  Hip fracture EXAM: CHEST  1 VIEW COMPARISON:  02/14/2016 FINDINGS: No focal opacity or pleural effusion. Borderline cardiomegaly. Mild aortic atherosclerosis. No pneumothorax. IMPRESSION: No active disease. Electronically Signed   By: Jasmine Pang M.D.   On: 02/10/2018 00:37   Pelvis Portable  Result Date: 02/10/2018 CLINICAL DATA:  72 year old female status post left femur ORIF for intertrochanteric fracture. EXAM: PORTABLE PELVIS 1-2 VIEWS COMPARISON:  Intraoperative images 15 54 hours today and earlier. FINDINGS: Portable AP supine views at 1637 hours. Left femur intramedullary rod redemonstrated with proximal interlocking dynamic hip screw. These traverse the comminuted left femur intertrochanteric fracture with stable appearing alignment to the intraoperative images. Superimposed left total knee arthroplasty. No new No acute osseous abnormality identified. Postoperative changes to the left hip and thigh soft tissues. IMPRESSION: 1. Left femur ORIF with no adverse hardware features. 2. No new osseous abnormality identified. Electronically Signed   By: Odessa Fleming M.D.   On: 02/10/2018 18:55   Dg C-arm 1-60 Min  Result Date: 02/10/2018 CLINICAL DATA:  Left hip fracture fixation. EXAM: DG C-ARM 61-120 MIN; LEFT  FEMUR 2 VIEWS COMPARISON:  Left femur x-rays dated Feb 09, 2018. FLUOROSCOPY TIME:  Fluoroscopy Time:  1 minutes, 36 seconds. FINDINGS: Multiple intraoperative x-rays demonstrate interval cephalomedullary rod fixation of the left intertrochanteric femur fracture. The lesser trochanter remains displaced. Alignment is otherwise anatomic. Moderate left hip osteoarthritis again noted. IMPRESSION: Left intertrochanteric femur fracture ORIF. Electronically Signed   By: Obie Dredge M.D.   On: 02/10/2018 16:20   Dg Hip Unilat With Pelvis 2-3 Views Left  Result Date: 02/10/2018 CLINICAL DATA:  Left hip pain  EXAM: DG HIP (WITH OR WITHOUT PELVIS) 2-3V LEFT COMPARISON:  None. FINDINGS: Calcified phleboliths in the pelvis. The SI joints are non widened. Mild right SI joint degenerative change. Pubic symphysis and rami appear intact. Both femoral heads project in joint. Acute comminuted left intertrochanteric fracture with displaced lesser trochanter fracture fragment. Moderate arthritis of the left hip with bony remodeling at the femoral head and neck junction. IMPRESSION: Acute comminuted and displaced left intertrochanteric fracture. Electronically Signed   By: Jasmine Pang M.D.   On: 02/10/2018 00:36   Dg Femur Min 2 Views Left  Result Date: 02/10/2018 CLINICAL DATA:  Left hip fracture fixation. EXAM: DG C-ARM 61-120 MIN; LEFT FEMUR 2 VIEWS COMPARISON:  Left femur x-rays dated Feb 09, 2018. FLUOROSCOPY TIME:  Fluoroscopy Time:  1 minutes, 36 seconds. FINDINGS: Multiple intraoperative x-rays demonstrate interval cephalomedullary rod fixation of the left intertrochanteric femur fracture. The lesser trochanter remains displaced. Alignment is otherwise anatomic. Moderate left hip osteoarthritis again noted. IMPRESSION: Left intertrochanteric femur fracture ORIF. Electronically Signed   By: Obie Dredge M.D.   On: 02/10/2018 16:20    Scheduled Meds: . amLODipine  10 mg Oral Daily  . [START ON 02/11/2018] aspirin EC  325 mg Oral Q breakfast  . docusate sodium  100 mg Oral BID  . insulin aspart  0-9 Units Subcutaneous Q4H  . levothyroxine  137 mcg Oral QAC breakfast  . metoprolol tartrate  100 mg Oral BID  . [START ON 02/11/2018] multivitamin with minerals  1 tablet Oral Daily  . pantoprazole  40 mg Oral Daily    Continuous Infusions: . sodium chloride 75 mL/hr at 02/10/18 0037  . cefTRIAXone (ROCEPHIN)  IV Stopped (02/10/18 1045)  . lactated ringers 10 mL/hr at 02/10/18 1816  . methocarbamol (ROBAXIN)  IV       LOS: 0 days     Myrtie Neither, MD [facility]  To reach me or the doctor on  call, go to: [email or web address] Password [email or web address]  02/10/2018, 10:05 PM

## 2018-02-10 NOTE — Consult Note (Signed)
Reason for Consult: Left hip fracture Referring Physician: EDP Meredith Shaw is an 72 y.o. female.  HPI: The patient is a very pleasant 72 year old female well-known to me.  I performed total knee replacements on her in the past.  Unfortunately she sustained a mechanical fall last evening in her yard landing directly on her left hip.  She had severe pain and inability to ambulate.  She was taken to the [facility] emergency room and found to have intertrochanteric hip fracture.  She is admitted to medicine service and I was consulted as a request from the patient's family since I had taken care of her knees before.  She does report severe left hip pain and denies any other injuries.  She denies any syncopal episode or loss of consciousness.  This was accidental mechanical fall.  Past Medical History:  Diagnosis Date  . Arthritis    OA AND PAIN BOTH KNEES -RIGHT KNEE HURTS WORSE THAN LEFT  . Diabetes mellitus    ORAL MEDICATION - NO INSULIN  . Hypertension   . Hypothyroidism   . Kidney stones    NONE AT PRESENT TIME THAT PT IS AWARE OF    Past Surgical History:  Procedure Laterality Date  . ABDOMINAL HYSTERECTOMY    . CHOLECYSTECTOMY  1972  . COLONOSCOPY    . EYE SURGERY     RIGHT CATARACT EXTRACTON WITH LENS IMPLANT  . GROWTH REMOVED FROM THYROID    . PERCUTANEOUS NEPHROLITHOTOMY    . TOTAL KNEE ARTHROPLASTY  06/09/2012   Procedure: TOTAL KNEE ARTHROPLASTY;  Surgeon: Mcarthur Rossetti, MD;  Location: [facility];  Service: Orthopedics;  Laterality: Right;  Right Total Knee Arthroplasty  . TOTAL KNEE ARTHROPLASTY Left 04/09/2014   Procedure: LEFT TOTAL KNEE ARTHROPLASTY;  Surgeon: Mcarthur Rossetti, MD;  Location: [place];  Service: Orthopedics;  Laterality: Left;  . TUBAL LIGATION      Family History  Problem Relation Age of Onset  . Heart disease Mother   . Stroke Father   . Heart disease Father   . Healthy Daughter   . Healthy Son     Social History:  reports that she  has never smoked. She has never used smokeless tobacco. She reports that she does not drink alcohol or use drugs.  Allergies:  Allergies  Allergen Reactions  . Penicillins Itching and Rash    Medications: I have reviewed the patient's current medications.  Results for orders placed or performed during the hospital encounter of 02/09/18 (from the past 48 hour(s))  Basic metabolic panel     Status: Abnormal   Collection Time: 02/10/18 12:28 AM  Result Value Ref Range   Sodium 140 135 - 145 mmol/L   Potassium 4.2 3.5 - 5.1 mmol/L   Chloride 105 101 - 111 mmol/L   CO2 21 (L) 22 - 32 mmol/L   Glucose, Bld 330 (H) 65 - 99 mg/dL   BUN 20 6 - 20 mg/dL   Creatinine, Ser 1.23 (H) 0.44 - 1.00 mg/dL   Calcium 9.3 8.9 - 10.3 mg/dL   GFR calc non Af Amer 43 (L) >60 mL/min   GFR calc Af Amer 50 (L) >60 mL/min    Comment: (NOTE) The eGFR has been calculated using the CKD EPI equation. This calculation has not been validated in all clinical situations. eGFR's persistently <60 mL/min signify possible Chronic Kidney Disease.    Anion gap 14 5 - 15    Comment: Performed at [facility],  [address], [place], [place] [address]  CBC WITH DIFFERENTIAL     Status: Abnormal   Collection Time: 02/10/18 12:28 AM  Result Value Ref Range   WBC 16.7 (H) 4.0 - 10.5 K/uL   RBC 4.36 3.87 - 5.11 MIL/uL   Hemoglobin 13.1 12.0 - 15.0 g/dL   HCT 37.5 36.0 - 46.0 %   MCV 86.0 78.0 - 100.0 fL   MCH 30.0 26.0 - 34.0 pg   MCHC 34.9 30.0 - 36.0 g/dL   RDW 13.2 11.5 - 15.5 %   Platelets 219 150 - 400 K/uL   Neutrophils Relative % 87 %   Neutro Abs 14.5 (H) 1.7 - 7.7 K/uL   Lymphocytes Relative 8 %   Lymphs Abs 1.3 0.7 - 4.0 K/uL   Monocytes Relative 5 %   Monocytes Absolute 0.8 0.1 - 1.0 K/uL   Eosinophils Relative 0 %   Eosinophils Absolute 0.0 0.0 - 0.7 K/uL   Basophils Relative 0 %   Basophils Absolute 0.0 0.0 - 0.1 K/uL    Comment: Performed at [facility], [place] [address],  [place], [place] [address]  Protime-INR     Status: None   Collection Time: 02/10/18 12:28 AM  Result Value Ref Range   Prothrombin Time 12.9 11.4 - 15.2 seconds   INR 0.98     Comment: Performed at [facility] [address], [place], [place] [address]  Type and screen [place]     Status: None   Collection Time: 02/10/18 12:28 AM  Result Value Ref Range   ABO/RH(D) A POS    Antibody Screen NEG    Sample Expiration      02/13/2018 Performed at [facility], [place] [address], [place], [place] [address]   Glucose, capillary     Status: Abnormal   Collection Time: 02/10/18  4:09 AM  Result Value Ref Range   Glucose-Capillary 321 (H) 65 - 99 mg/dL  CBC     Status: Abnormal   Collection Time: 02/10/18  5:06 AM  Result Value Ref Range   WBC 13.1 (H) 4.0 - 10.5 K/uL   RBC 3.87 3.87 - 5.11 MIL/uL   Hemoglobin 11.6 (L) 12.0 - 15.0 g/dL   HCT 33.2 (L) 36.0 - 46.0 %   MCV 85.8 78.0 - 100.0 fL   MCH 30.0 26.0 - 34.0 pg   MCHC 34.9 30.0 - 36.0 g/dL   RDW 13.3 11.5 - 15.5 %   Platelets 222 150 - 400 K/uL    Comment: Performed at [facility], [place] [address] [place],  [address]  Basic metabolic panel     Status: Abnormal   Collection Time: 02/10/18  5:06 AM  Result Value Ref Range   Sodium 140 135 - 145 mmol/L   Potassium 4.8 3.5 - 5.1 mmol/L   Chloride 104 101 - 111 mmol/L   CO2 24 22 - 32 mmol/L   Glucose, Bld 308 (H) 65 - 99 mg/dL   BUN 19 6 - 20 mg/dL   Creatinine, Ser 1.20 (H) 0.44 - 1.00 mg/dL   Calcium 8.9 8.9 - 10.3 mg/dL   GFR calc non Af Amer 44 (L) >60 mL/min   GFR calc Af Amer 51 (L) >60 mL/min    Comment: (NOTE) The eGFR has been calculated using the CKD EPI equation. This calculation has not been validated in all clinical situations. eGFR's persistently <60 mL/min signify possible Chronic Kidney Disease.  Anion gap 12 5 - 15    Comment: Performed at [facility] [address], [place], [place] [address]  Surgical  pcr screen     Status: None   Collection Time: 02/10/18  5:33 AM  Result Value Ref Range   MRSA, PCR NEGATIVE NEGATIVE   Staphylococcus aureus NEGATIVE NEGATIVE    Comment: (NOTE) The Xpert SA Assay (FDA approved for NASAL specimens in patients 44 years of age and older), is one component of a comprehensive surveillance program. It is not intended to diagnose infection nor to guide or monitor treatment. Performed at [facility], [place] [address] [place], [place] [address]   Urinalysis, Routine w reflex microscopic     Status: Abnormal   Collection Time: 02/10/18  5:33 AM  Result Value Ref Range   Color, Urine YELLOW YELLOW   APPearance HAZY (A) CLEAR   Specific Gravity, Urine 1.015 1.005 - 1.030   pH 5.0 5.0 - 8.0   Glucose, UA >=500 (A) NEGATIVE mg/dL   Hgb urine dipstick MODERATE (A) NEGATIVE   Bilirubin Urine NEGATIVE NEGATIVE   Ketones, ur 5 (A) NEGATIVE mg/dL   Protein, ur 100 (A) NEGATIVE mg/dL   Nitrite NEGATIVE NEGATIVE   Leukocytes, UA LARGE (A) NEGATIVE   RBC / HPF 21-50 0 - 5 RBC/hpf   WBC, UA >50 (H) 0 - 5 WBC/hpf   Bacteria, UA RARE (A) NONE SEEN   Squamous Epithelial / LPF 0-5 0 - 5    Comment: Please note change in reference range. Performed at [facility], [place] [address] [place], [place] [address]   Glucose, capillary     Status: Abnormal   Collection Time: 02/10/18 12:05 PM  Result Value Ref Range   Glucose-Capillary 174 (H) 65 - 99 mg/dL    Dg Chest 1 View  Result Date: 02/10/2018 CLINICAL DATA:  Hip fracture EXAM: CHEST  1 VIEW COMPARISON:  02/14/2016 FINDINGS: No focal opacity or pleural effusion. Borderline cardiomegaly. Mild aortic atherosclerosis. No pneumothorax. IMPRESSION: No active disease. Electronically Signed   By: Donavan Foil M.D.   On: 02/10/2018 00:37   Dg Hip Unilat With Pelvis 2-3 Views Left  Result Date: 02/10/2018 CLINICAL DATA:  Left hip pain EXAM: DG HIP (WITH OR WITHOUT PELVIS) 2-3V LEFT COMPARISON:  None. FINDINGS:  Calcified phleboliths in the pelvis. The SI joints are non widened. Mild right SI joint degenerative change. Pubic symphysis and rami appear intact. Both femoral heads project in joint. Acute comminuted left intertrochanteric fracture with displaced lesser trochanter fracture fragment. Moderate arthritis of the left hip with bony remodeling at the femoral head and neck junction. IMPRESSION: Acute comminuted and displaced left intertrochanteric fracture. Electronically Signed   By: Donavan Foil M.D.   On: 02/10/2018 00:36    Review of Systems  All other systems reviewed and are negative.  Blood pressure (!) 162/87, pulse 79, temperature 98.4 F (36.9 C), temperature source Oral, resp. rate 19, height 5' 7.01" (1.702 m), weight 170 lb (77.1 kg), SpO2 98 %. Physical Exam  Constitutional: She is oriented to person, place, and time. She appears well-developed and well-nourished.  HENT:  Head: Normocephalic and atraumatic.  Neck: Normal range of motion.  Cardiovascular: Normal rate.  Respiratory: Effort normal.  GI: Soft. Bowel sounds are normal.  Musculoskeletal:       Left hip: She exhibits decreased range of motion, decreased strength, tenderness and deformity.  Neurological: She is oriented to person, place, and time.  Skin: Skin is warm and dry.  Psychiatric: She has a normal mood and affect.    Assessment/Plan: Left hip with displaced intertrochanteric proximal femur fracture  The plan will be to proceed to surgery today for surgical fixation of her left proximal femur fracture.  I talked to her and her family in length about the surgery.  Her risk and benefits discussion was had and informed consent was obtained.  I showed the x-rays to the family as well and the scrub of the surgery involves with a hip screw and rod.  Hopefully we can can stabilize the fracture to the point we can allow early weightbearing.  All questions concerns were answered and addressed.  Mcarthur Rossetti 02/10/2018, 2:49 PM

## 2018-02-10 NOTE — H&P (Signed)
History and Physical    Meredith Shaw WUJ:[id number] DOB: [birth year] DOA: 02/09/2018  PCP: Johna Sheriff, MD  Patient coming from: Home.  Chief Complaint: Fall.  HPI: Meredith Shaw is a 72 y.o. female with history of diabetes mellitus type 2, hypertension, hyperlipidemia, hypothyroidism and a fall at home while trying to clean her cough.  Patient denies hitting her head or losing consciousness.  Since patient had persistent pain in the left hip patient came to the ER.  Denies any chest pain shortness of breath or palpitations.  ED Course: In the ER patient's x-rays showed left hip fracture Dr. Lajoyce Corners on-call orthopedic surgeon has been consulted.  Labs show elevated blood sugar and leukocytosis.  UA shows features concerning for UTI.  Review of Systems: As per HPI, rest all negative.   Past Medical History:  Diagnosis Date  . Arthritis    OA AND PAIN BOTH KNEES -RIGHT KNEE HURTS WORSE THAN LEFT  . Diabetes mellitus    ORAL MEDICATION - NO INSULIN  . Hypertension   . Hypothyroidism   . Kidney stones    NONE AT PRESENT TIME THAT PT IS AWARE OF    Past Surgical History:  Procedure Laterality Date  . ABDOMINAL HYSTERECTOMY    . CHOLECYSTECTOMY  1972  . COLONOSCOPY    . EYE SURGERY     RIGHT CATARACT EXTRACTON WITH LENS IMPLANT  . GROWTH REMOVED FROM THYROID    . PERCUTANEOUS NEPHROLITHOTOMY    . TOTAL KNEE ARTHROPLASTY  06/09/2012   Procedure: TOTAL KNEE ARTHROPLASTY;  Surgeon: Kathryne Hitch, MD;  Location: [facility];  Service: Orthopedics;  Laterality: Right;  Right Total Knee Arthroplasty  . TOTAL KNEE ARTHROPLASTY Left 04/09/2014   Procedure: LEFT TOTAL KNEE ARTHROPLASTY;  Surgeon: Kathryne Hitch, MD;  Location: [facility];  Service: Orthopedics;  Laterality: Left;  . TUBAL LIGATION       reports that she has never smoked. She has never used smokeless tobacco. She reports that she does not drink alcohol or use drugs.  Allergies  Allergen Reactions  .  Penicillins Itching and Rash    Family History  Problem Relation Age of Onset  . Heart disease Mother   . Stroke Father   . Heart disease Father   . Healthy Daughter   . Healthy Son     Prior to Admission medications   Medication Sig Start Date End Date Taking? Authorizing Provider  amLODipine (NORVASC) 10 MG tablet TAKE ONE (1) TABLET EACH DAY 02/03/18  Yes Johna Sheriff, MD  aspirin 81 MG tablet Take 81 mg by mouth daily.   Yes [provider]  cholecalciferol (VITAMIN D) 1000 units tablet Take 1,000 Units by mouth daily.   Yes [provider]  levothyroxine (SYNTHROID, LEVOTHROID) 137 MCG tablet TAKE ONE (1) TABLET EACH DAY 01/18/18  Yes Hawks, Christy A, FNP  lisinopril (PRINIVIL,ZESTRIL) 20 MG tablet TAKE ONE (1) TABLET EACH DAY 01/18/18  Yes Hawks, Christy A, FNP  metFORMIN (GLUCOPHAGE) 1000 MG tablet TAKE ONE TABLET BY MOUTH TWICE DAILY 01/18/18  Yes Jannifer Rodney A, FNP  metoprolol tartrate (LOPRESSOR) 100 MG tablet TAKE ONE TABLET BY MOUTH TWICE DAILY 05/24/17  Yes Johna Sheriff, MD  pravastatin (PRAVACHOL) 40 MG tablet TAKE ONE TABLET DAILY AT BEDTIME 10/05/17  Yes Johna Sheriff, MD    Physical Exam: Vitals:   02/10/18 0145 02/10/18 0200 02/10/18 0215 02/10/18 0230  BP: 122/76 140/79 137/84 (!) 172/89  Pulse: 71  76 74 80  Resp: Temp:      TempSrc:      SpO2: 96% 91% 97% 97%  Weight:      Height:          Constitutional: Moderately built and nourished. Vitals:   02/10/18 0145 02/10/18 0200 02/10/18 0215 02/10/18 0230  BP: 122/76 140/79 137/84 (!) 172/89  Pulse: 71 76 74 80  Resp: Temp:      TempSrc:      SpO2: 96% 91% 97% 97%  Weight:      Height:       Eyes: Anicteric no pallor.  ENMT: No discharge from the ears eyes nose or mouth. Neck: No mass palpated no JVD appreciated. Respiratory: No rhonchi or crepitations. Cardiovascular: S1-S2 heard no murmurs appreciated. Abdomen: Soft nontender bowel sounds  present. Musculoskeletal: No edema.  Pain on moving left hip. Skin: No rash. Neurologic: Alert awake oriented to time place and person.  Moves all extremities.   Psychiatric: Appears normal.   Labs on Admission: I have personally reviewed following labs and imaging studies  CBC: Recent Labs  Lab 02/10/18 0028  WBC 16.7*  NEUTROABS 14.5*  HGB 13.1  HCT 37.5  MCV 86.0  PLT 219   Basic Metabolic Panel: Recent Labs  Lab 02/10/18 0028  NA 140  K 4.2  CL 105  CO2 21*  GLUCOSE 330*  BUN 20  CREATININE 1.23*  CALCIUM 9.3   GFR: Estimated Creatinine Clearance: 44.9 mL/min (A) (by C-G formula based on SCr of 1.23 mg/dL (H)). Liver Function Tests: No results for input(s): AST, ALT, ALKPHOS, BILITOT, PROT, ALBUMIN in the last 168 hours. No results for input(s): LIPASE, AMYLASE in the last 168 hours. No results for input(s): AMMONIA in the last 168 hours. Coagulation Profile: Recent Labs  Lab 02/10/18 0028  INR 0.98   Cardiac Enzymes: No results for input(s): CKTOTAL, CKMB, CKMBINDEX, TROPONINI in the last 168 hours. BNP (last 3 results) No results for input(s): PROBNP in the last 8760 hours. HbA1C: No results for input(s): HGBA1C in the last 72 hours. CBG: No results for input(s): GLUCAP in the last 168 hours. Lipid Profile: No results for input(s): CHOL, HDL, LDLCALC, TRIG, CHOLHDL, LDLDIRECT in the last 72 hours. Thyroid Function Tests: No results for input(s): TSH, T4TOTAL, FREET4, T3FREE, THYROIDAB in the last 72 hours. Anemia Panel: No results for input(s): VITAMINB12, FOLATE, FERRITIN, TIBC, IRON, RETICCTPCT in the last 72 hours. Urine analysis:    Component Value Date/Time   COLORURINE YELLOW 04/02/2014 1424   APPEARANCEUR Clear 12/31/2017 0829   LABSPEC 1.025 04/02/2014 1424   PHURINE 5.0 04/02/2014 1424   GLUCOSEU Negative 12/31/2017 0829   HGBUR LARGE (A) 04/02/2014 1424   BILIRUBINUR Negative 12/31/2017 0829   KETONESUR NEGATIVE 04/02/2014 1424    PROTEINUR 2+ (A) 12/31/2017 0829   PROTEINUR 100 (A) 04/02/2014 1424   UROBILINOGEN 0.2 04/02/2014 1424   NITRITE Negative 12/31/2017 0829   NITRITE NEGATIVE 04/02/2014 1424   LEUKOCYTESUR 2+ (A) 12/31/2017 0829   Sepsis Labs: (procalcitonin:4,lacticidven:4) )No results found for this or any previous visit (from the past 240 hour(s)).   Radiological Exams on Admission: Dg Chest 1 View  Result Date: 02/10/2018 CLINICAL DATA:  Hip fracture EXAM: CHEST  1 VIEW COMPARISON:  02/14/2016 FINDINGS: No focal opacity or pleural effusion. Borderline cardiomegaly. Mild aortic atherosclerosis. No pneumothorax. IMPRESSION: No active disease. Electronically Signed   By: Adrian Prows.D.  On: 02/10/2018 00:37   Dg Hip Unilat With Pelvis 2-3 Views Left  Result Date: 02/10/2018 CLINICAL DATA:  Left hip pain EXAM: DG HIP (WITH OR WITHOUT PELVIS) 2-3V LEFT COMPARISON:  None. FINDINGS: Calcified phleboliths in the pelvis. The SI joints are non widened. Mild right SI joint degenerative change. Pubic symphysis and rami appear intact. Both femoral heads project in joint. Acute comminuted left intertrochanteric fracture with displaced lesser trochanter fracture fragment. Moderate arthritis of the left hip with bony remodeling at the femoral head and neck junction. IMPRESSION: Acute comminuted and displaced left intertrochanteric fracture. Electronically Signed   By: Jasmine Pang M.D.   On: 02/10/2018 00:36    EKG: Independently reviewed.  Normal sinus rhythm.  Assessment/Plan Principal Problem:   Closed left hip fracture (HCC) Active Problems:   Hypertension   Hypothyroidism   Hyperlipidemia, mixed   CKD (chronic kidney disease) stage 3, GFR 30-59 ml/min (HCC)   Controlled type 2 diabetes mellitus with hyperglycemia (HCC)   Hip fracture (HCC)    1. Left hip fracture status post mechanical fall -patient is at moderate risk for intermediate risk procedure.  Would keep patient n.p.o. and on pain  relief medication in anticipation of surgery per Dr. Lajoyce Corners on-call orthopedic surgeon was notified. 2. Hypothyroidism on Synthroid. 3. Hypertension on metoprolol which be continued along with amlodipine.  Will hold lisinopril and continue after surgery.  PRN IV hydralazine. 4. Leukocytosis could be from stress on likely UTI.  Patient is on ceftriaxone.  Follow urine cultures.  Follow CBC. 5. Possible chronic kidney disease stage III follow metabolic panel. 6. Diabetes mellitus type 2 uncontrolled with hyperglycemia -for now I have kept patient on fluids and sliding scale coverage.  Closely follow CBGs.  Follow metabolic panel.  May need long-acting insulin if sugars tend to be remain high.   DVT prophylaxis: SCDs in anticipation of procedure. Code Status: Full code. Family Communication: Discussed with patient. Disposition Plan: Likely SNF. Consults called: Orthopedics. Admission status: Inpatient.   Eduard Clos MD [facility] Pager [phone].  If 7PM-7AM, please contact night-coverage [email or web address] Password [email or web address]  02/10/2018, 3:51 AM

## 2018-02-10 NOTE — Transfer of Care (Signed)
Immediate Anesthesia Transfer of Care Note  Patient: Meredith Shaw  Procedure(s) Performed: INTRAMEDULLARY (IM) NAIL INTERTROCHANTRIC (Left )  Patient Location: PACU  Anesthesia Type:General  Level of Consciousness: awake, alert  and oriented  Airway & Oxygen Therapy: Patient Spontanous Breathing and Patient connected to nasal cannula oxygen  Post-op Assessment: Report given to RN and Post -op Vital signs reviewed and stable  Post vital signs: Reviewed and stable  Last Vitals:  Vitals Value Taken Time  BP 157/94 02/10/2018  4:31 PM  Temp 37.1 C 02/10/2018  4:30 PM  Pulse 81 02/10/2018  4:37 PM  Resp 22 02/10/2018  4:37 PM  SpO2 100 % 02/10/2018  4:37 PM  Vitals shown include unvalidated device data.  Last Pain:  Vitals:   02/10/18 1630  TempSrc:   PainSc: 0-No pain      Patients Stated Pain Goal: 0 (02/10/18 0700)  Complications: No apparent anesthesia complications

## 2018-02-10 NOTE — Progress Notes (Signed)
Initial Nutrition Assessment  DOCUMENTATION CODES:   Not applicable  INTERVENTION:   -MVI daily -RD will follow for diet advancement and add supplement as appropriate  NUTRITION DIAGNOSIS:   Increased nutrient needs related to post-op healing as evidenced by estimated needs.  GOAL:   Patient will meet greater than or equal to 90% of their needs  MONITOR:   Diet advancement, Labs, Weight trends, Skin, I & O's  REASON FOR ASSESSMENT:   Consult Assessment of nutrition requirement/status  ASSESSMENT:   Meredith Shaw is a 72 y.o. female with history of diabetes mellitus type 2, hypertension, hyperlipidemia, hypothyroidism and a fall at home while trying to clean her cough.  Patient denies hitting her head or losing consciousness.  Since patient had persistent pain in the left hip patient came to the ER.  Denies any chest pain shortness of breath or palpitations.  Pt admitted with closed lt hip fx. Pt currently NPO for surgery to be done later this afternoon.   Spoke with pt and family members at bedside. Pt reports good appetite PTA, usually consuming 3 meals per day (Breakfast: grits, eggs, and toast, Supper: chicken pot pie or soup and sandwich). Pt denies any weight loss; reports UBW is around 170-175#, which is consistent with wt hx. She denies any changes in mobility prior to hospitalization.   Pt understanding of rationale of NPO diet order. Discussed potential for diet advancement after surgery and importance of good meal intake to promote healing. Pt with no nutritional concerns at this time, but expressed appreciation for visit.   No recent Hgb A1c available (Hgb A1c: 6.6 on 06/12/2015). PTA DM medications 1000 mg metformin BID.   Labs reviewed: CBGS: 321 (inpatient orders for glycemic control are 0-9 units insulin aspart every 4 hours).   NUTRITION - FOCUSED PHYSICAL EXAM:    Most Recent Value  Orbital Region  No depletion  Upper Arm Region  No depletion  Thoracic  and Lumbar Region  No depletion  Buccal Region  No depletion  Temple Region  No depletion  Clavicle Bone Region  No depletion  Clavicle and Acromion Bone Region  No depletion  Scapular Bone Region  No depletion  Dorsal Hand  No depletion  Patellar Region  No depletion  Anterior Thigh Region  No depletion  Posterior Calf Region  No depletion  Edema (RD Assessment)  None  Hair  Reviewed  Eyes  Reviewed  Mouth  Reviewed  Skin  Reviewed  Nails  Reviewed       Diet Order:   Diet Order    None      EDUCATION NEEDS:   Education needs have been addressed  Skin:  Skin Assessment: Skin Integrity Issues: Skin Integrity Issues:: Stage I Stage I: sacrum  Last BM:  02/09/18  Height:   Ht Readings from Last 1 Encounters:  02/09/18  (1.702 m)    Weight:   Wt Readings from Last 1 Encounters:  02/09/18 170 lb (77.1 kg)    Ideal Body Weight:  61.4 kg  BMI:  Body mass index is 26.63 kg/m.  Estimated Nutritional Needs:   Kcal:  1700-1900  Protein:  85-100 grams  Fluid:  1.7-1.9 L    Meredith Shaw, RD, LDN, CDE Pager: [phone] After hours Pager: [phone]

## 2018-02-11 LAB — CBC
HCT: 27.2 % — ABNORMAL LOW (ref 36.0–46.0)
HEMATOCRIT: 25.8 % — AB (ref 36.0–46.0)
Hemoglobin: 9.2 g/dL — ABNORMAL LOW (ref 12.0–15.0)
Hemoglobin: 8.6 g/dL — ABNORMAL LOW (ref 12.0–15.0)
MCH: 28.9 pg (ref 26.0–34.0)
MCH: 29.4 pg (ref 26.0–34.0)
MCHC: 33.8 g/dL (ref 30.0–36.0)
MCHC: 33.3 g/dL (ref 30.0–36.0)
MCV: 85.5 fL (ref 78.0–100.0)
MCV: 88.1 fL (ref 78.0–100.0)
PLATELETS: 203 10*3/uL (ref 150–400)
PLATELETS: 201 10*3/uL (ref 150–400)
RBC: 3.18 MIL/uL — ABNORMAL LOW (ref 3.87–5.11)
RBC: 2.93 MIL/uL — ABNORMAL LOW (ref 3.87–5.11)
RDW: 13.5 % (ref 11.5–15.5)
RDW: 13.8 % (ref 11.5–15.5)
WBC: 10.5 10*3/uL (ref 4.0–10.5)
WBC: 11.9 10*3/uL — AB (ref 4.0–10.5)

## 2018-02-11 LAB — BASIC METABOLIC PANEL
Anion gap: 14 (ref 5–15)
BUN: 19 mg/dL (ref 6–20)
CALCIUM: 8.5 mg/dL — AB (ref 8.9–10.3)
CO2: 20 mmol/L — ABNORMAL LOW (ref 22–32)
CREATININE: 1.38 mg/dL — AB (ref 0.44–1.00)
Chloride: 103 mmol/L (ref 101–111)
GFR calc Af Amer: 43 mL/min — ABNORMAL LOW (ref >60)
GFR, EST NON AFRICAN AMERICAN: 37 mL/min — AB (ref >60)
GLUCOSE: 301 mg/dL — AB (ref 65–99)
Potassium: 4.7 mmol/L (ref 3.5–5.1)
SODIUM: 137 mmol/L (ref 135–145)

## 2018-02-11 LAB — GLUCOSE, CAPILLARY
GLUCOSE-CAPILLARY: 275 mg/dL — AB (ref 65–99)
GLUCOSE-CAPILLARY: 240 mg/dL — AB (ref 65–99)
Glucose-Capillary: 227 mg/dL — ABNORMAL HIGH (ref 65–99)
Glucose-Capillary: 233 mg/dL — ABNORMAL HIGH (ref 65–99)
Glucose-Capillary: 242 mg/dL — ABNORMAL HIGH (ref 65–99)
Glucose-Capillary: 309 mg/dL — ABNORMAL HIGH (ref 65–99)
Glucose-Capillary: 333 mg/dL — ABNORMAL HIGH (ref 65–99)

## 2018-02-11 MED ORDER — SODIUM CHLORIDE 0.9 % IV BOLUS
500.0000 mL | Freq: Once | INTRAVENOUS | Status: AC
  Administered 2018-02-11: 500 mL via INTRAVENOUS

## 2018-02-11 NOTE — Progress Notes (Signed)
02/11/18 1329  PT Visit Information  Last PT Received On 02/11/18  Assistance Needed +2  PT/OT/SLP Co-Evaluation/Treatment Yes  Reason for Co-Treatment For patient/therapist safety;To address functional/ADL transfers  PT goals addressed during session Mobility/safety with mobility;Balance;Proper use of DME  History of Present Illness Pt is a 72 year old woman admitted after falling while cleaning out her car, resulting in L hip fx. Pt underwent IM nail. PMH: B TKAs, HTN, DM, CKD, hypothyroidism.  Precautions  Precautions Fall  Restrictions  Weight Bearing Restrictions Yes  LLE Weight Bearing WBAT  Home Living  Family/patient expects to be discharged to: Private residence  Living Arrangements Spouse/significant other  Available Help at Discharge Family;Available 24 hours/day  Type of Home House  Home Access Stairs to enter  Entrance Stairs-Number of Steps 2  Entrance Stairs-Rails None  Home Layout One level  Bathroom Shower/Tub Walk-in shower  Bathroom Toilet Handicapped height  Home Equipment Walker - 2 wheels;BSC;Shower seat;Cane - single point;Hand held shower head;Other (comment);Grab bars - toilet;Grab bars - tub/shower;Adaptive equipment;Wheelchair - manual (lift chair )  IT trainer  Prior Function  Level of Independence Independent  Communication  Communication No difficulties  Pain Assessment  Pain Assessment 0-10  Pain Score 7  Pain Location LLE   Pain Descriptors / Indicators Aching  Pain Intervention(s) Limited activity within patient's tolerance;Monitored during session;Repositioned  Cognition  Arousal/Alertness Awake/alert  Behavior During Therapy WFL for tasks assessed/performed  Overall Cognitive Status Within Functional Limits for tasks assessed  Upper Extremity Assessment  Upper Extremity Assessment Defer to OT evaluation  Lower Extremity Assessment  Lower Extremity Assessment LLE deficits/detail  LLE Deficits / Details Very  limited weightbearing on LLE secondary to pain. Deficits consistent with post op pain and weakness.   Cervical / Trunk Assessment  Cervical / Trunk Assessment Normal  Bed Mobility  General bed mobility comments pt received in chair  Transfers  Overall transfer level Needs assistance  Equipment used Rolling walker (2 wheeled)  Transfers Sit to/from Stand;Stand Pivot Transfers  Sit to Stand +2 physical assistance;Max assist  Stand pivot transfers +2 physical assistance;Min assist  General transfer comment pt needing 2 person assist to rise and steady, assist to move walker and verbal cues for technique as pt took pivotal steps to [facility] and back to chair, max assist to control descent. Pt shaky throughout transfer.   Ambulation/Gait  Gait velocity Deferred.   Balance  Overall balance assessment Needs assistance  Sitting-balance support No upper extremity supported  Sitting balance-Leahy Scale Fair  Standing balance support Bilateral upper extremity supported;During functional activity  Standing balance-Leahy Scale Poor  Standing balance comment unable to release walker in standing  PT - End of Session  Equipment Utilized During Treatment Gait belt  Activity Tolerance Patient limited by pain  Patient left in chair;with call bell/phone within reach  Nurse Communication Mobility status  PT Assessment  PT Recommendation/Assessment Patient needs continued PT services  PT Visit Diagnosis Other abnormalities of gait and mobility (R26.89);Difficulty in walking, not elsewhere classified (R26.2);Pain  Pain - Right/Left Left  Pain - part of body Leg  PT Problem List Decreased strength;Decreased balance;Decreased range of motion;Decreased activity tolerance;Decreased mobility;Decreased knowledge of use of DME;Decreased knowledge of precautions;Pain  PT Plan  PT Frequency (ACUTE ONLY) Min 3X/week  PT Treatment/Interventions (ACUTE ONLY) Gait training;DME instruction;Functional mobility  training;Therapeutic exercise;Therapeutic activities;Balance training;Patient/family education  AM-PAC PT "6 Clicks" Daily Activity Outcome Measure  Difficulty turning over in bed (including adjusting bedclothes, sheets and blankets)?  1  Difficulty moving from lying on back to sitting on the side of the bed?  1  Difficulty sitting down on and standing up from a chair with arms (e.g., wheelchair, bedside commode, etc,.)? 1  Help needed moving to and from a bed to chair (including a wheelchair)? 3  Help needed walking in hospital room? 2  Help needed climbing 3-5 steps with a railing?  1  6 Click Score 9  Mobility G Code  CL  PT Recommendation  Follow Up Recommendations SNF;Supervision for mobility/OOB  PT equipment None recommended by PT  Individuals Consulted  Consulted and Agree with Results and Recommendations Patient  Acute Rehab PT Goals  Patient Stated Goal to return home  PT Goal Formulation With patient  Time For Goal Achievement 02/25/18  Potential to Achieve Goals Fair  PT Time Calculation  PT Start Time (ACUTE ONLY) 1014  PT Stop Time (ACUTE ONLY) 1039  PT Time Calculation (min) (ACUTE ONLY) 25 min  PT General Charges  $$ ACUTE PT VISIT 1 Visit  PT Evaluation  $PT Eval Moderate Complexity 1 Mod  Written Expression  Dominant Hand Right   Pt s/p surgery above with deficits below. Pt requiring max A +2 for sit<>Stand transfers and min A +2 for stand pivot with RW this session. Pt shaky throughout mobility and mobility limited secondary to pain. Pt wanting to go home at d/c, however, given current mobility deficits feel pt is at an increased risk for falls. Recommend SNF at d/c to increase independence and safety with mobility. Will continue to follow acutely to maximize functional mobility independence and safety.   Gladys Damme, PT, DPT  Acute Rehabilitation Services  Pager: [phone]

## 2018-02-11 NOTE — Anesthesia Postprocedure Evaluation (Signed)
Anesthesia Post Note  Patient: Meredith Shaw  Procedure(s) Performed: INTRAMEDULLARY (IM) NAIL INTERTROCHANTRIC (Left )     Patient location during evaluation: PACU Anesthesia Type: General Level of consciousness: awake and alert Pain management: pain level controlled Vital Signs Assessment: post-procedure vital signs reviewed and stable Respiratory status: spontaneous breathing, nonlabored ventilation, respiratory function stable and patient connected to nasal cannula oxygen Cardiovascular status: blood pressure returned to baseline and stable Postop Assessment: no apparent nausea or vomiting Anesthetic complications: no    Last Vitals:  Vitals:   02/11/18 0433 02/11/18 0748  BP: 139/83 124/71  Pulse: (!) 103 (!) 101  Resp:  16  Temp: 36.8 C 37.2 C  SpO2: 95% 96%    Last Pain:  Vitals:   02/11/18 0905  TempSrc:   PainSc: 4                  [name]

## 2018-02-11 NOTE — Progress Notes (Signed)
Orthopedic Tech Progress Note Patient Details:  Meredith Shaw 1946-04-28 [id number]  Patient ID: Meredith Shaw, female   DOB: October 18, 1945, 72 y.o.   MRN: [id number] Pt cant have ohf due to age restrictions.  Trinna Post 02/11/2018, 7:12 PM

## 2018-02-11 NOTE — Progress Notes (Signed)
Pt's recent BP readings are 82/53 and 86/58. Pt is asymptomatic. MD X. Blount was notified. Per MD - to administer 500cc of Normal Saline. RN will continue to monitor.

## 2018-02-11 NOTE — Progress Notes (Signed)
Patient ID: Meredith Shaw, female   DOB: 1946/05/24, 72 y.o.   MRN: [id number] Postoperative day 1 left hip intramedullary fixation for intertrochanteric hip fracture.  Patient is sitting up in bed without complaints this morning.  Physical therapy progressive ambulation discharge planning as per therapy recommendations.

## 2018-02-11 NOTE — Op Note (Signed)
[name], Meredith Shaw MEDICAL RECORD ZO:[id number] ACCOUNT [id number] DATE OF BIRTH:08/24/1946 FACILITY: [facility] LOCATION: [facility] PHYSICIAN:Candence Sease Aretha Parrot, MD  OPERATIVE REPORT  DATE OF PROCEDURE:  02/10/2018  PREOPERATIVE DIAGNOSIS:  Left hip intertrochanteric proximal femur fracture.  POSTOPERATIVE DIAGNOSIS:  Left hip intertrochanteric proximal femur fracture.  PROCEDURE:  Open reduction and internal fixation of left intertrochanteric hip fracture using intramedullary rod and hip screw.  IMPLANTS:  [employer] Affixus intramedullary nail system with 11 x 360 femoral nail and a 90 mm lag screw.  SURGEON:  Vanita Panda. Manson Passey, MD  ASSISTANT:  Salli Real, PA-C  ANESTHESIA:  General.  ANTIBIOTICS:  2 g IV Ancef.  ESTIMATED BLOOD LOSS:  100 mL.  COMPLICATIONS:  None.  INDICATIONS:  The patient is a 72 year old active female who sustained an unfortunate mechanical fall in her yard yesterday evening.  She denies any syncopal episodes or loss of consciousness.  She had an inability to ambulate after this and was brought  to the [facility] Emergency Room.  X-rays did confirm a comminuted left intertrochanteric proximal femur fracture.  She was admitted to the Medicine Service and cleared for surgery.  She is now presenting for definitive fixation of this fracture.  I had  a long and thorough discussion with her.  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  She understands that hopefully we will just stabilize the  fracture with a rod screw and at a later date can always perform a hip replacement, only if needed.  She said her hip pain has not been significant until last evening following the fracture.  With this surgery, she understands the risk of acute blood  loss anemia, as well as nerve and vessel injury, further fracture, as well as hardware failure and  nonunion.  DESCRIPTION OF PROCEDURE:  After informed consent was obtained for which left hip was marked, she was brought to the operating room where general anesthesia was obtained while she was on her stretcher.  She was then placed supine on the fracture  operating table with a perineal post in place, her left operative leg in in-line skeletal traction and the right operative leg in a well-leg holder with appropriate padding.  We then assessed her fracture under direct fluoroscopy and with traction and  internal rotation reduced it in what we felt was as adequate a position as possible, verifying this in the AP and lateral planes.  We then went back to the AP plane, and keeping an intramedullary nail and its box sterile, we placed this over the femoral  canal.  I was able to get my length off of this, choosing an 11 mm x 360 femoral nail for a left femur.  We then passed this off the back table sterilely.  The left hip was prepped and draped with DuraPrep and sterile drapes.  A time-out was called to  identify correct patient and correct left hip.  We then made an incision just proximal to the tip of the greater trochanter and dissected down to the tip of the greater trochanter.  I could easily palpate this, and under direct fluoroscopy, placed a  temporary guide pin from the tip of the greater trochanter down past the lesser trochanter.  We verified its placement in the AP and lateral planes.  I then used the initiating reamer to open up the femoral canal and removed the pin after that.  We then  placed the long femoral nail down the femoral shaft in the canal in an antegrade fashion without needing to ream due to a wide femoral canal.  We verified its place under direct fluoroscopic guidance.  Then, using the outrigger guide, we made a separate  lateral incision over the midthigh and put a temporary guide pin from the lateral cortex of the femur, traversing the fracture into the femoral head and what we  felt was an adequate position.  We then took a measurement off this, and we drilled for the  depth for a 90 mm lag screw.  We placed a lag screw without difficulty and let some traction off the bed and placed the compression component.  We then secured it from above.  We removed the outrigger guide and all instrumentation.  I put the hip through  internal and external rotation  under direct fluoroscopy, and we were pleased with the reduction.  On one lateral view, I do feel that we were just slightly more anterior than it would normally have, but hopefully it felt like it was a good bone stock  that will be fine.  We then irrigated the soft tissues with normal saline solution.  I closed the deep tissue of both incisions with 0 Vicryl, followed by 2-0 Vicryl subcutaneous tissue and interrupted staples on the skin.  Xeroform and a well-padded  sterile dressing were applied.  She was taken off the fracture table, awakened, extubated, and taken to recovery room in stable condition.  All final counts were correct.  There were no complications noted.    Postoperatively, we will allow attempts at weightbearing as tolerated with early mobilization.    Of note, Edward Jolly, PA-C, assisted the entire case.  The assistant was crucial in facilitating all aspects of this case.  LN/NUANCE  D:02/10/2018 T:02/10/2018 JOB:[id number]

## 2018-02-11 NOTE — Progress Notes (Signed)
Pt's BP 113/60.

## 2018-02-11 NOTE — Evaluation (Signed)
Occupational Therapy Evaluation Patient Details [name] MRN: [id number] DOB: [birth year] Today's Date: 02/11/2018    History of Present Illness Pt is a 72 year old woman admitted after falling while cleaning out her car, resulting in L hip fx. Pt underwent IM nail. PMH: B TKAs, HTN, DM, CKD, hypothyroidism.   Clinical Impression   Pt was independent prior to admission. Currently, pt requires 2 person assist for mobility and set up to total assist for ADL. Recommending continued rehab in SNF upon discharge. Will follow acutely.    Follow Up Recommendations  SNF;Supervision/Assistance - 24 hour    Equipment Recommendations  None recommended by OT    Recommendations for Other Services       Precautions / Restrictions Precautions Precautions: Fall Restrictions Weight Bearing Restrictions: Yes LLE Weight Bearing: Weight bearing as tolerated      Mobility Bed Mobility               General bed mobility comments: pt received in chair  Transfers Overall transfer level: Needs assistance Equipment used: Rolling walker (2 wheeled) Transfers: Sit to/from Stand;Stand Pivot Transfers Sit to Stand: +2 physical assistance;Max assist Stand pivot transfers: +2 physical assistance;Min assist       General transfer comment: pt needing 2 person assist to rise and steady, assist to move walker and verbal cues for technique as pt took pivotal steps to [facility] and back to chair, max assist to control descent     Balance Overall balance assessment: Needs assistance   Sitting balance-Leahy Scale: Fair       Standing balance-Leahy Scale: Poor Standing balance comment: unable to release walker in standing                           ADL either performed or assessed with clinical judgement   ADL Overall ADL's : Needs assistance/impaired Eating/Feeding: Independent;Sitting   Grooming: Set up;Sitting   Upper Body Bathing: Set up;Sitting   Lower Body Bathing:  Total assistance;Sit to/from stand   Upper Body Dressing : Set up;Sitting   Lower Body Dressing: Total assistance;Sit to/from stand   Toilet Transfer: +2 for physical assistance;Minimal assistance;Stand-pivot;BSC;RW   Toileting- Clothing Manipulation and Hygiene: Total assistance;+2 for physical assistance;Sit to/from stand               Vision Baseline Vision/History: Wears glasses Wears Glasses: At all times Patient Visual Report: No change from baseline       Perception     Praxis      Pertinent Vitals/Pain Pain Assessment: 0-10 Pain Score: 7  Pain Location: LLE  Pain Descriptors / Indicators: Aching Pain Intervention(s): Monitored during session;Ice applied;Repositioned     Hand Dominance Right   Extremity/Trunk Assessment Upper Extremity Assessment Upper Extremity Assessment: Overall WFL for tasks assessed(shakey)   Lower Extremity Assessment Lower Extremity Assessment: Defer to PT evaluation       Communication Communication Communication: No difficulties   Cognition Arousal/Alertness: Awake/alert Behavior During Therapy: WFL for tasks assessed/performed Overall Cognitive Status: Within Functional Limits for tasks assessed                                     General Comments       Exercises     Shoulder Instructions      Home Living Family/patient expects to be discharged to:: Private residence Living Arrangements: Spouse/significant other Available  Help at Discharge: Family;Available 24 hours/day Type of Home: House Home Access: Stairs to enter [employer] of Steps: 2 Entrance Stairs-Rails: None Home Layout: One level     Bathroom Shower/Tub: Producer, television/film/video: Handicapped height     Home Equipment: Environmental consultant - 2 wheels;Bedside commode;Shower seat;Cane - single point;Hand held shower head;Other (comment);Grab bars - toilet;Grab bars - tub/shower;Adaptive equipment;Wheelchair - manual(lift  chair) Adaptive Equipment: Ship broker        Prior Functioning/Environment Level of Independence: Independent                 OT Problem List: Decreased strength;Decreased activity tolerance;Impaired balance (sitting and/or standing);Decreased safety awareness;Decreased knowledge of use of DME or AE;Pain;Obesity      OT Treatment/Interventions: Self-care/ADL training;DME and/or AE instruction;Cognitive remediation/compensation;Patient/family education;Balance training;Therapeutic activities    OT Goals(Current goals can be found in the care plan section) Acute Rehab OT Goals Patient Stated Goal: to return home OT Goal Formulation: With patient Time For Goal Achievement: 02/25/18 Potential to Achieve Goals: Good ADL Goals Pt Will Perform Grooming: standing;with min assist Pt Will Perform Lower Body Bathing: with min assist;with adaptive equipment;sit to/from stand Pt Will Perform Lower Body Dressing: sit to/from stand;with min assist;with adaptive equipment Pt Will Transfer to Toilet: with min assist;ambulating;bedside commode Pt Will Perform Toileting - Clothing Manipulation and hygiene: with min assist;sit to/from stand  OT Frequency: Min 2X/week   Barriers to D/C:            Co-evaluation PT/OT/SLP Co-Evaluation/Treatment: Yes Reason for Co-Treatment: For patient/therapist safety   OT goals addressed during session: ADL's and self-care;Proper use of Adaptive equipment and DME      AM-PAC PT "6 Clicks" Daily Activity     Outcome Measure Help from another person eating meals?: None Help from another person taking care of personal grooming?: A Little Help from another person toileting, which includes using toliet, bedpan, or urinal?: Total Help from another person bathing (including washing, rinsing, drying)?: Total Help from another person to put on and taking off regular upper body clothing?: A Little Help from another person to put on and taking off regular  lower body clothing?: Total 6 Click Score: 13   End of Session Equipment Utilized During Treatment: Gait belt;Rolling walker  Activity Tolerance: Patient limited by pain Patient left: in chair;with call bell/phone within reach  OT Visit Diagnosis: Unsteadiness on feet (R26.81);Pain Pain - Right/Left: Left Pain - part of body: Hip                Time: 1610-9604 OT Time Calculation (min): 25 min Charges:  OT General Charges $OT Visit: 1 Visit OT Evaluation $OT Eval Moderate Complexity: 1 Mod G-Codes:     03-03-18 Martie Round, OTR/L Pager: [phone]  Iran Planas, [place] Bailiff 03/03/2018, 11:18 AM

## 2018-02-11 NOTE — Progress Notes (Signed)
[facility]                                                                              Patient DSreya Froio  Shanik Bawa, is a 72 y.o. female, DOB - 11/14/1945, AVW:[id number]  Admit date - 02/09/2018   Admitting Physician Eduard Clos, MD  Outpatient Primary MD for the patient is Johna Sheriff, MD  Outpatient specialists:   LOS - 1  days    Chief Complaint  Patient presents with  . Fall       Brief summary  MAURISHA MONGEAU a 72 y.o.femalewithhistory of diabetes mellitus type 2, hypertension, hyperlipidemia, hypothyroidism presenting left knee pain after mechanical fall during which she sustained displaced left trochanteric femoral fracture for which reason she is admitted   Assessment & Plan    Principal Problem:   Closed left hip fracture (HCC) Active Problems:   Hypertension   Hypothyroidism   Hyperlipidemia, mixed   CKD (chronic kidney disease) stage 3, GFR 30-59 ml/min (HCC)   Controlled type 2 diabetes mellitus with hyperglycemia (HCC)   Hip fracture (HCC)   Pressure injury of skin   #1 left hip displaced intertrochanteric proximal femur fracture: Status post left hip surgery with screws  and rods placement 02/10/2018 Routine postoperative care Supportive care Orthopedist consulted  #2 hypertension: Continue with hypertensive regimen   #3 UTI: Antibiotics   #4 type 2 diabetes: Glycemic control      Code Status: Full code DVT Prophylaxis:  SCD's Family Communication: Discussed in detail with the patient, all imaging results, lab results explained to the patient Disposition Plan: Possible SNF  Time Spent in minutes  35  minutes  Procedures:  Hip surgery 02/10/2018  Consultants:   Orthopedist  Antimicrobials:      Medications  Scheduled Meds: . amLODipine  10 mg Oral Daily  . aspirin EC  325 mg Oral Q breakfast  . docusate sodium  100 mg Oral BID  . insulin aspart  0-9 Units Subcutaneous Q4H  .  levothyroxine  137 mcg Oral QAC breakfast  . metoprolol tartrate  100 mg Oral BID  . multivitamin with minerals  1 tablet Oral Daily  . pantoprazole  40 mg Oral Daily   Continuous Infusions: . sodium chloride 75 mL/hr at 02/10/18 0037  . cefTRIAXone (ROCEPHIN)  IV Stopped (02/11/18 0935)  . lactated ringers 10 mL/hr at 02/10/18 1816  . methocarbamol (ROBAXIN)  IV     PRN Meds:.acetaminophen, alum & mag hydroxide-simeth, hydrALAZINE, HYDROmorphone (DILAUDID) injection, menthol-cetylpyridinium **OR** phenol, methocarbamol **OR** methocarbamol (ROBAXIN)  IV, metoCLOPramide **OR** metoCLOPramide (REGLAN) injection, morphine injection, ondansetron **OR** ondansetron (ZOFRAN) IV, oxyCODONE, oxyCODONE   Antibiotics   Anti-infectives (From admission, onward)   Start     Dose/Rate Route Frequency Ordered Stop   02/10/18 0900  cefTRIAXone (ROCEPHIN) 1 g in sodium chloride 0.9 % 100 mL IVPB     1 g 200 mL/hr over 30 Minutes Intravenous Every 24 hours 02/10/18 0809          Subjective:   Alia Parsley was seen and examined today.  Patient denies dizziness, chest pain, shortness of breath, no fever  or chills Objective:   Vitals:   02/11/18 0034 02/11/18 0433 02/11/18 0748 02/11/18 1142  BP: 108/68 139/83 124/71 110/74  Pulse: (!) 106 (!) 103 (!) 101 (!) 102  Resp:   16 18  Temp: 98.6 F (37 C) 98.3 F (36.8 C) 98.9 F (37.2 C) 98.2 F (36.8 C)  TempSrc: Oral Oral Oral Oral  SpO2: 99% 95% 96% 97%  Weight:      Height:        Intake/Output Summary (Last 24 hours) at 02/11/2018 1228 Last data filed at 02/11/2018 0935 Gross per 24 hour  Intake 1890 ml  Output 625 ml  Net 1265 ml     Wt Readings from Last 3 Encounters:  02/10/18 77.1 kg (170 lb)  12/31/17 78 kg (172 lb)  07/05/17 80.2 kg (176 lb 12.8 oz)     Exam  General: NAD  HEENT: NCAT,  PERRL,MMM  Neck: SUPPLE, (-) JVD  Cardiovascular: RRR, (-) GALLOP, (-) MURMUR  Respiratory: CTA  Gastrointestinal: SOFT,  (-) DISTENSION, BS(+), (_) TENDERNESS  Ext: (-) CYANOSIS, (-) EDEMA  Neuro: A, OX 3  Skin:(-) RASH  Psych:NORMAL AFFECT/MOOD   Data Reviewed:  I have personally reviewed following labs and imaging studies  Micro Results Recent Results (from the past 240 hour(s))  Surgical pcr screen     Status: None   Collection Time: 02/10/18  5:33 AM  Result Value Ref Range Status   MRSA, PCR NEGATIVE NEGATIVE Final   Staphylococcus aureus NEGATIVE NEGATIVE Final    Comment: (NOTE) The Xpert SA Assay (FDA approved for NASAL specimens in patients 63 years of age and older), is one component of a comprehensive surveillance program. It is not intended to diagnose infection nor to guide or monitor treatment. Performed at [facility], [address], [place], [place] [address]   Culture, Urine     Status: None (Preliminary result)   Collection Time: 02/10/18 10:07 AM  Result Value Ref Range Status   Specimen Description URINE, RANDOM  Final   Special Requests NONE  Final   Culture   Final    CULTURE REINCUBATED FOR BETTER GROWTH Performed at [facility], [address] [place], [place] [address]    Report Status PENDING  Incomplete    Radiology Reports Dg Chest 1 View  Result Date: 02/10/2018 CLINICAL DATA:  Hip fracture EXAM: CHEST  1 VIEW COMPARISON:  02/14/2016 FINDINGS: No focal opacity or pleural effusion. Borderline cardiomegaly. Mild aortic atherosclerosis. No pneumothorax. IMPRESSION: No active disease. Electronically Signed   By: Jasmine Pang M.D.   On: 02/10/2018 00:37   Pelvis Portable  Result Date: 02/10/2018 CLINICAL DATA:  72 year old female status post left femur ORIF for intertrochanteric fracture. EXAM: PORTABLE PELVIS 1-2 VIEWS COMPARISON:  Intraoperative images 15 54 hours today and earlier. FINDINGS: Portable AP supine views at 1637 hours. Left femur intramedullary rod redemonstrated with proximal interlocking dynamic hip screw. These traverse the  comminuted left femur intertrochanteric fracture with stable appearing alignment to the intraoperative images. Superimposed left total knee arthroplasty. No new No acute osseous abnormality identified. Postoperative changes to the left hip and thigh soft tissues. IMPRESSION: 1. Left femur ORIF with no adverse hardware features. 2. No new osseous abnormality identified. Electronically Signed   By: Odessa Fleming M.D.   On: 02/10/2018 18:55   Dg C-arm 1-60 Min  Result Date: 02/10/2018 CLINICAL DATA:  Left hip fracture fixation. EXAM: DG C-ARM 61-120 MIN; LEFT FEMUR 2 VIEWS COMPARISON:  Left femur x-rays dated Feb 09, 2018. FLUOROSCOPY TIME:  Fluoroscopy Time:  1 minutes, 36 seconds. FINDINGS: Multiple intraoperative x-rays demonstrate interval cephalomedullary rod fixation of the left intertrochanteric femur fracture. The lesser trochanter remains displaced. Alignment is otherwise anatomic. Moderate left hip osteoarthritis again noted. IMPRESSION: Left intertrochanteric femur fracture ORIF. Electronically Signed   By: Obie Dredge M.D.   On: 02/10/2018 16:20   Dg Hip Unilat With Pelvis 2-3 Views Left  Result Date: 02/10/2018 CLINICAL DATA:  Left hip pain EXAM: DG HIP (WITH OR WITHOUT PELVIS) 2-3V LEFT COMPARISON:  None. FINDINGS: Calcified phleboliths in the pelvis. The SI joints are non widened. Mild right SI joint degenerative change. Pubic symphysis and rami appear intact. Both femoral heads project in joint. Acute comminuted left intertrochanteric fracture with displaced lesser trochanter fracture fragment. Moderate arthritis of the left hip with bony remodeling at the femoral head and neck junction. IMPRESSION: Acute comminuted and displaced left intertrochanteric fracture. Electronically Signed   By: Jasmine Pang M.D.   On: 02/10/2018 00:36   Dg Femur Min 2 Views Left  Result Date: 02/10/2018 CLINICAL DATA:  Left hip fracture fixation. EXAM: DG C-ARM 61-120 MIN; LEFT FEMUR 2 VIEWS COMPARISON:  Left femur  x-rays dated Feb 09, 2018. FLUOROSCOPY TIME:  Fluoroscopy Time:  1 minutes, 36 seconds. FINDINGS: Multiple intraoperative x-rays demonstrate interval cephalomedullary rod fixation of the left intertrochanteric femur fracture. The lesser trochanter remains displaced. Alignment is otherwise anatomic. Moderate left hip osteoarthritis again noted. IMPRESSION: Left intertrochanteric femur fracture ORIF. Electronically Signed   By: Obie Dredge M.D.   On: 02/10/2018 16:20    Lab Data:  CBC: Recent Labs  Lab 02/10/18 0028 02/10/18 0506 02/11/18 0548  WBC 16.7* 13.1* 10.5  NEUTROABS 14.5*  --   --   HGB 13.1 11.6* 9.2*  HCT 37.5 33.2* 27.2*  MCV 86.0 85.8 85.5  PLT 219 222 203   Basic Metabolic Panel: Recent Labs  Lab 02/10/18 0028 02/10/18 0506 02/11/18 0548  NA 140 140 137  K 4.2 4.8 4.7  CL 105 104 103  CO2 21* 24 20*  GLUCOSE 330* 308* 301*  BUN CREATININE 1.23* 1.20* 1.38*  CALCIUM 9.3 8.9 8.5*   GFR: Estimated Creatinine Clearance: 40 mL/min (A) (by C-G formula based on SCr of 1.38 mg/dL (H)). Liver Function Tests: No results for input(s): AST, ALT, ALKPHOS, BILITOT, PROT, ALBUMIN in the last 168 hours. No results for input(s): LIPASE, AMYLASE in the last 168 hours. No results for input(s): AMMONIA in the last 168 hours. Coagulation Profile: Recent Labs  Lab 02/10/18 0028  INR 0.98   Cardiac Enzymes: No results for input(s): CKTOTAL, CKMB, CKMBINDEX, TROPONINI in the last 168 hours. BNP (last 3 results) No results for input(s): PROBNP in the last 8760 hours. HbA1C: No results for input(s): HGBA1C in the last 72 hours. CBG: Recent Labs  Lab 02/10/18 2041 02/11/18 0038 02/11/18 0437 02/11/18 0746 02/11/18 1140  GLUCAP 280* 333* 309* 242* 275*   Lipid Profile: No results for input(s): CHOL, HDL, LDLCALC, TRIG, CHOLHDL, LDLDIRECT in the last 72 hours. Thyroid Function Tests: No results for input(s): TSH, T4TOTAL, FREET4, T3FREE, THYROIDAB in the  last 72 hours. Anemia Panel: No results for input(s): VITAMINB12, FOLATE, FERRITIN, TIBC, IRON, RETICCTPCT in the last 72 hours. Urine analysis:    Component Value Date/Time   COLORURINE YELLOW 02/10/2018 0533   APPEARANCEUR HAZY (A) 02/10/2018 0533   APPEARANCEUR Clear 12/31/2017 0829   LABSPEC 1.015  02/10/2018 0533   PHURINE 5.0 02/10/2018 0533   GLUCOSEU >=500 (A) 02/10/2018 0533   HGBUR MODERATE (A) 02/10/2018 0533   BILIRUBINUR NEGATIVE 02/10/2018 0533   BILIRUBINUR Negative 12/31/2017 0829   KETONESUR 5 (A) 02/10/2018 0533   PROTEINUR 100 (A) 02/10/2018 0533   UROBILINOGEN 0.2 04/02/2014 1424   NITRITE NEGATIVE 02/10/2018 0533   LEUKOCYTESUR LARGE (A) 02/10/2018 0533   LEUKOCYTESUR 2+ (A) 12/31/2017 0829     Jackie Plum M.D. [facility] 02/11/2018, 12:28 PM  Pager: [phone] Between 7am to 7pm - call Pager - [phone]  After 7pm go to [email or web address] - password [email or web address]  Call night coverage person covering after 7pm

## 2018-02-12 ENCOUNTER — Encounter (HOSPITAL_COMMUNITY): Payer: Self-pay | Admitting: *Deleted

## 2018-02-12 LAB — CBC
HCT: 25 % — ABNORMAL LOW (ref 36.0–46.0)
Hemoglobin: 8.4 g/dL — ABNORMAL LOW (ref 12.0–15.0)
MCH: 29.5 pg (ref 26.0–34.0)
MCHC: 33.6 g/dL (ref 30.0–36.0)
MCV: 87.7 fL (ref 78.0–100.0)
Platelets: 176 10*3/uL (ref 150–400)
RBC: 2.85 MIL/uL — ABNORMAL LOW (ref 3.87–5.11)
RDW: 13.8 % (ref 11.5–15.5)
WBC: 12 10*3/uL — ABNORMAL HIGH (ref 4.0–10.5)

## 2018-02-12 LAB — URINE CULTURE: Culture: >=100000 — AB

## 2018-02-12 LAB — BASIC METABOLIC PANEL
Anion gap: 9 (ref 5–15)
BUN: 29 mg/dL — AB (ref 6–20)
CHLORIDE: 106 mmol/L (ref 101–111)
CO2: 24 mmol/L (ref 22–32)
Calcium: 8.4 mg/dL — ABNORMAL LOW (ref 8.9–10.3)
Creatinine, Ser: 1.87 mg/dL — ABNORMAL HIGH (ref 0.44–1.00)
GFR calc non Af Amer: 26 mL/min — ABNORMAL LOW (ref >60)
GFR, EST AFRICAN AMERICAN: 30 mL/min — AB (ref >60)
Glucose, Bld: 225 mg/dL — ABNORMAL HIGH (ref 65–99)
POTASSIUM: 3.9 mmol/L (ref 3.5–5.1)
Sodium: 139 mmol/L (ref 135–145)

## 2018-02-12 LAB — GLUCOSE, CAPILLARY
GLUCOSE-CAPILLARY: 237 mg/dL — AB (ref 65–99)
Glucose-Capillary: 222 mg/dL — ABNORMAL HIGH (ref 65–99)
Glucose-Capillary: 281 mg/dL — ABNORMAL HIGH (ref 65–99)
Glucose-Capillary: 227 mg/dL — ABNORMAL HIGH (ref 65–99)
Glucose-Capillary: 222 mg/dL — ABNORMAL HIGH (ref 65–99)

## 2018-02-12 MED ORDER — INSULIN DETEMIR 100 UNIT/ML ~~LOC~~ SOLN
10.0000 [IU] | Freq: Every day | SUBCUTANEOUS | Status: DC
  Administered 2018-02-12 – 2018-02-13 (×2): 10 [IU] via SUBCUTANEOUS
  Filled 2018-02-12 (×3): qty 0.1

## 2018-02-12 MED ORDER — SODIUM CHLORIDE 0.9 % IV SOLN
500.0000 mg | INTRAVENOUS | Status: DC
  Administered 2018-02-12 – 2018-02-13 (×2): 500 mg via INTRAVENOUS
  Filled 2018-02-12 (×3): qty 500

## 2018-02-12 NOTE — Progress Notes (Signed)
ANTIBIOTIC CONSULT NOTE - INITIAL  Pharmacy Consult for Vanco Indication: MRSA UTI  Allergies  Allergen Reactions  . Penicillins Itching and Rash    Patient Measurements: Height: 5' 7.01" (170.2 cm) Weight: 170 lb (77.1 kg) IBW/kg (Calculated) : 61.62 Adjusted Body Weight:    Vital Signs: Temp: 98.6 F (37 C) (05/05 1305) Temp Source: Oral (05/05 1305) BP: 121/93 (05/05 1305) Pulse Rate: 70 (05/05 1305) Intake/Output from previous day: 05/04 0701 - 05/05 0700 In: 100 [IV Piggyback:100] Out: -  Intake/Output from this shift: Total I/O In: 100 [IV Piggyback:100] Out: -   Labs: Recent Labs    02/10/18 0506 02/11/18 0548 02/11/18 2113 02/12/18 0304  WBC 13.1* 10.5 11.9* 12.0*  HGB 11.6* 9.2* 8.6* 8.4*  PLT 222 203 201 176  CREATININE 1.20* 1.38*  --  1.87*   Estimated Creatinine Clearance: 29.5 mL/min (A) (by C-G formula based on SCr of 1.87 mg/dL (H)). No results for input(s): VANCOTROUGH, VANCOPEAK, VANCORANDOM, GENTTROUGH, GENTPEAK, GENTRANDOM, TOBRATROUGH, TOBRAPEAK, TOBRARND, AMIKACINPEAK, AMIKACINTROU, AMIKACIN in the last 72 hours.   Microbiology:   Medical History: Past Medical History:  Diagnosis Date  . Arthritis    OA AND PAIN BOTH KNEES -RIGHT KNEE HURTS WORSE THAN LEFT  . Diabetes mellitus    ORAL MEDICATION - NO INSULIN  . Hypertension   . Hypothyroidism   . Kidney stones    NONE AT PRESENT TIME THAT PT IS AWARE OF   Assessment: CC/HPI: Fall with L knee pain> displaced left trochanteric femoral fracture. UA shows features concerning for UTI.  PMH: DM, HTN, HLD, hypothyroid, CKD,   Significant events: Status post left hip surgery with screws  and rods placement 02/10/2018  ID: UTI. Afebrile. WBC 12. Discussed with Dr. Julio Sicks. Scr 1.87 with CrCl 29. Symptomatic vs Asymptomatic? Colonized?  - 5/3: UCx: >100,000 MRSA  Vanco 5/5>>  Goal of Therapy:  Vancomycin trough level 10-15 mcg/ml  Plan:  D/c Rocephin Vancomycin  IV q  24h. Short course recommended 3-5 days if treatment continued   Meredith Shaw S. Merilynn Finland, PharmD, BCPS Clinical Staff Pharmacist Pager [phone]  Meredith Shaw 02/12/2018,2:51 PM

## 2018-02-12 NOTE — NC FL2 (Signed)
[place] MEDICAID FL2 LEVEL OF CARE SCREENING TOOL     IDENTIFICATION  Patient [name] Birthdate: May 05, 1946 Sex: female Admission Date (Current Location): 02/09/2018  [facility] and [place] Number:  Producer, television/film/video and Address:  [facility], [address], [place], [place] [address]      Provider Number: [id number]  Attending Physician Name and Address:  Jackie Plum, MD  Relative Name and Phone Number:       Current Level of Care: Hospital Recommended Level of Care:   Prior Approval Number:    Date Approved/Denied:   PASRR Number: [id number]  Discharge Plan: SNF    Current Diagnoses: Patient Active Problem List   Diagnosis Date Noted  . Closed left hip fracture (HCC) 02/10/2018  . CKD (chronic kidney disease) stage 3, GFR 30-59 ml/min (HCC) 02/10/2018  . Controlled type 2 diabetes mellitus with hyperglycemia (HCC) 02/10/2018  . Hip fracture (HCC) 02/10/2018  . Pressure injury of skin 02/10/2018  . Chest pain 02/19/2016  . Arthritis of left knee 04/09/2014  . Status post total knee replacement 04/09/2014  . Arthropathy   . Diabetes (HCC)   . Hypertension   . Kidney stones   . Hypothyroidism   . Degenerative arthritis of knee 06/09/2012  . Flatulence, eructation, and gas pain 12/24/2005  . Abdominal or pelvic swelling, mass, or lump, other specified site 12/24/2005  . Osteoporosis 12/04/2004  . Symptomatic menopausal or female climacteric states 12/04/2004  . Arthropathy, lower leg 04/21/2004  . Hematuria 09/17/2003  . Other long term (current) drug therapy 07/05/2002  . Hyperlipidemia, mixed 03/01/2000    Orientation RESPIRATION BLADDER Height & Weight     Situation, Place, Time, Self  Normal Continent Weight: 170 lb (77.1 kg) Height:  5' 7.01" (170.2 cm)  BEHAVIORAL SYMPTOMS/MOOD NEUROLOGICAL BOWEL NUTRITION STATUS      Continent Diet(heart healthy/carb modified, thin liquids)  AMBULATORY STATUS  COMMUNICATION OF NEEDS Skin   Extensive Assist Verbally PU Stage and Appropriate Care, Surgical wounds(Closed incision left hip, silicone dressing) PU Stage 1 Dressing: (Mid sacrum, foam dressing)                     Personal Care Assistance Level of Assistance  Bathing, Feeding, Dressing Bathing Assistance: Maximum assistance Feeding assistance: Independent Dressing Assistance: Maximum assistance     Functional Limitations Info  Sight, Hearing, Speech Sight Info: Adequate Hearing Info: Adequate Speech Info: Adequate    SPECIAL CARE FACTORS FREQUENCY  PT (By licensed PT), OT (By licensed OT)     PT Frequency: 3x OT Frequency: 3x            Contractures Contractures Info: Not present    Additional Factors Info  Code Status, Allergies Code Status Info: Full Code Allergies Info: Penicillins           Current Medications (02/12/2018):  This is the current hospital active medication list Current Facility-Administered Medications  Medication Dose Route Frequency Provider Last Rate Last Dose  . 0.9 %  sodium chloride infusion   Intravenous Continuous Kathryne Hitch, MD 75 mL/hr at 02/12/18 0423    . acetaminophen (TYLENOL) tablet 325-650 mg  325-650 mg Oral Q6H PRN Kathryne Hitch, MD      . alum & mag hydroxide-simeth (MAALOX/MYLANTA) 200-200-20 MG/5ML suspension 30 mL  30 mL Oral Q4H PRN Kathryne Hitch, MD      . amLODipine (NORVASC) tablet 10 mg  10 mg Oral Daily Magnus Ivan,  Vanita Panda, MD   10 mg at 02/12/18 0905  . aspirin EC tablet 325 mg  325 mg Oral Q breakfast Kathryne Hitch, MD   325 mg at 02/12/18 7829  . cefTRIAXone (ROCEPHIN) 1 g in sodium chloride 0.9 % 100 mL IVPB  1 g Intravenous Q24H Kathryne Hitch, MD   Stopped at 02/12/18 0935  . docusate sodium (COLACE) capsule 100 mg  100 mg Oral BID Kathryne Hitch, MD   100 mg at 02/12/18 0905  . hydrALAZINE (APRESOLINE) injection 5 mg  5 mg Intravenous Q4H PRN  Kathryne Hitch, MD      . HYDROmorphone (DILAUDID) injection 0.5-1 mg  0.5-1 mg Intravenous Q4H PRN Kathryne Hitch, MD      . insulin aspart (novoLOG) injection 0-9 Units  0-9 Units Subcutaneous Q4H Kathryne Hitch, MD   5 Units at 02/12/18 [phone]  . lactated ringers infusion   Intravenous Continuous Kathryne Hitch, MD 10 mL/hr at 02/10/18 1816    . levothyroxine (SYNTHROID, LEVOTHROID) tablet 137 mcg  137 mcg Oral QAC breakfast Kathryne Hitch, MD   137 mcg at 02/12/18 0631  . menthol-cetylpyridinium (CEPACOL) lozenge 3 mg  1 lozenge Oral PRN Kathryne Hitch, MD       Or  . phenol (CHLORASEPTIC) mouth spray 1 spray  1 spray Mouth/Throat PRN Kathryne Hitch, MD      . methocarbamol (ROBAXIN) tablet 500 mg  500 mg Oral Q6H PRN Kathryne Hitch, MD   500 mg at 02/12/18 1033   Or  . methocarbamol (ROBAXIN) 500 mg in dextrose 5 % 50 mL IVPB  500 mg Intravenous Q6H PRN Kathryne Hitch, MD      . metoCLOPramide (REGLAN) tablet 5-10 mg  5-10 mg Oral Q8H PRN Kathryne Hitch, MD       Or  . metoCLOPramide (REGLAN) injection 5-10 mg  5-10 mg Intravenous Q8H PRN Kathryne Hitch, MD      . metoprolol tartrate (LOPRESSOR) tablet 100 mg  100 mg Oral BID Kathryne Hitch, MD   100 mg at 02/12/18 0905  . morphine 2 MG/ML injection 1 mg  1 mg Intravenous Q2H PRN Kathryne Hitch, MD   1 mg at 02/10/18 1816  . multivitamin with minerals tablet 1 tablet  1 tablet Oral Daily Kathryne Hitch, MD   1 tablet at 02/12/18 3086  . ondansetron (ZOFRAN) tablet 4 mg  4 mg Oral Q6H PRN Kathryne Hitch, MD       Or  . ondansetron [facility]) injection 4 mg  4 mg Intravenous Q6H PRN Kathryne Hitch, MD      . oxyCODONE (Oxy IR/ROXICODONE) immediate release tablet 10-15 mg  10-15 mg Oral Q4H PRN Kathryne Hitch, MD      . oxyCODONE (Oxy IR/ROXICODONE) immediate release tablet 5-10 mg  5-10 mg Oral  Q4H PRN Kathryne Hitch, MD   5 mg at 02/12/18 1034  . pantoprazole (PROTONIX) EC tablet 40 mg  40 mg Oral Daily Kathryne Hitch, MD   40 mg at 02/12/18 5784     Discharge Medications: Please see discharge summary for a list of discharge medications.  Relevant Imaging Results:  Relevant Lab Results:   Additional Information SSN; [id number]  Maree Krabbe, LCSW

## 2018-02-12 NOTE — Progress Notes (Signed)
[facility]                                                                              Patient Demographics  Meredith Shaw, is a 72 y.o. female, DOB - Jul 22, 1946, WUJ:[id number]  Admit date - 02/09/2018   Admitting Physician Eduard Clos, MD  Outpatient Primary MD for the patient is Johna Sheriff, MD  Outpatient specialists:   LOS - 2  days    Chief Complaint  Patient presents with  . Fall       Brief summary  Meredith Shaw a 72 y.o.femalewithhistory of diabetes mellitus type 2, hypertension, hyperlipidemia, hypothyroidism presenting left knee pain after mechanical fall during which she sustained displaced left trochanteric femoral fracture for which reason she is admitted   Assessment & Plan    Principal Problem:   Closed left hip fracture (HCC) Active Problems:   Hypertension   Hypothyroidism   Hyperlipidemia, mixed   CKD (chronic kidney disease) stage 3, GFR 30-59 ml/min (HCC)   Controlled type 2 diabetes mellitus with hyperglycemia (HCC)   Hip fracture (HCC)   Pressure injury of skin   #1 left hip displaced intertrochanteric proximal femur fracture: Status post left hip surgery with screws  and rods placement 02/10/2018 Routine postoperative care Supportive care Orthopedist consulted  #2 hypertension: Continue with hypertensive regimen   #3 UTI: Antibiotics   #4 type 2 diabetes: Glycemic control      Code Status: Full code DVT Prophylaxis:  SCD's Family Communication: Discussed in detail with the patient, all imaging results, lab results explained to the patient Disposition Plan: Possible SNF  Time Spent in minutes  35  minutes  Procedures:  Hip surgery 02/10/2018  Consultants:   Orthopedist  Antimicrobials:      Medications  Scheduled Meds: . amLODipine  10 mg Oral Daily  . aspirin EC  325 mg Oral Q breakfast  . docusate sodium  100 mg Oral BID  . insulin aspart  0-9 Units Subcutaneous Q4H  .  levothyroxine  137 mcg Oral QAC breakfast  . metoprolol tartrate  100 mg Oral BID  . multivitamin with minerals  1 tablet Oral Daily  . pantoprazole  40 mg Oral Daily   Continuous Infusions: . sodium chloride 75 mL/hr at 02/12/18 0423  . cefTRIAXone (ROCEPHIN)  IV Stopped (02/12/18 0935)  . lactated ringers 10 mL/hr at 02/10/18 1816  . methocarbamol (ROBAXIN)  IV     PRN Meds:.acetaminophen, alum & mag hydroxide-simeth, hydrALAZINE, HYDROmorphone (DILAUDID) injection, menthol-cetylpyridinium **OR** phenol, methocarbamol **OR** methocarbamol (ROBAXIN)  IV, metoCLOPramide **OR** metoCLOPramide (REGLAN) injection, morphine injection, ondansetron **OR** ondansetron (ZOFRAN) IV, oxyCODONE, oxyCODONE   Antibiotics   Anti-infectives (From admission, onward)   Start     Dose/Rate Route Frequency Ordered Stop   02/10/18 0900  cefTRIAXone (ROCEPHIN) 1 g in sodium chloride 0.9 % 100 mL IVPB     1 g 200 mL/hr over 30 Minutes Intravenous Every 24 hours 02/10/18 0809          Subjective:   Meredith Shaw was seen and examined today.  Patient denies dizziness, chest pain, shortness of breath, no fever  or chills Objective:   Vitals:   02/11/18 2219 02/12/18 0411 02/12/18 1022 02/12/18 1305  BP: 113/60 129/72  (!) 121/93  Pulse: 69 84  70  Resp:    18  Temp:  98.3 F (36.8 C)  98.6 F (37 C)  TempSrc:  Oral  Oral  SpO2:  93% 97% 91%  Weight:      Height:        Intake/Output Summary (Last 24 hours) at 02/12/2018 1414 Last data filed at 02/12/2018 0935 Gross per 24 hour  Intake 100 ml  Output -  Net 100 ml     Wt Readings from Last 3 Encounters:  02/10/18 77.1 kg (170 lb)  12/31/17 78 kg (172 lb)  07/05/17 80.2 kg (176 lb 12.8 oz)     Exam  General: NAD  HEENT: NCAT,  PERRL,MMM  Neck: SUPPLE, (-) JVD  Cardiovascular: RRR, (-) GALLOP, (-) MURMUR  Respiratory: CTA  Gastrointestinal: SOFT, (-) DISTENSION, BS(+), (_) TENDERNESS  Ext: (-) CYANOSIS, (-) EDEMA  Neuro:  A, OX 3  Skin:(-) RASH  Psych:NORMAL AFFECT/MOOD   Data Reviewed:  I have personally reviewed following labs and imaging studies  Micro Results Recent Results (from the past 240 hour(s))  Surgical pcr screen     Status: None   Collection Time: 02/10/18  5:33 AM  Result Value Ref Range Status   MRSA, PCR NEGATIVE NEGATIVE Final   Staphylococcus aureus NEGATIVE NEGATIVE Final    Comment: (NOTE) The Xpert SA Assay (FDA approved for NASAL specimens in patients 25 years of age and older), is one component of a comprehensive surveillance program. It is not intended to diagnose infection nor to guide or monitor treatment. Performed at [facility], [address], [place], [place] [address]   Culture, Urine     Status: Abnormal   Collection Time: 02/10/18 10:07 AM  Result Value Ref Range Status   Specimen Description URINE, RANDOM  Final   Special Requests   Final    NONE Performed at [facility], [address] [place], [place] [address]    Culture (A)  Final    >=100,000 COLONIES/mL METHICILLIN RESISTANT STAPHYLOCOCCUS AUREUS   Report Status 02/12/2018 FINAL  Final   Organism ID, Bacteria METHICILLIN RESISTANT STAPHYLOCOCCUS AUREUS (A)  Final      Susceptibility   Methicillin resistant staphylococcus aureus - MIC*    CIPROFLOXACIN >=8 RESISTANT Resistant     GENTAMICIN <=0.5 SENSITIVE Sensitive     NITROFURANTOIN <=16 SENSITIVE Sensitive     OXACILLIN >=4 RESISTANT Resistant     TETRACYCLINE <=1 SENSITIVE Sensitive     VANCOMYCIN 1 SENSITIVE Sensitive     TRIMETH/SULFA <=10 SENSITIVE Sensitive     CLINDAMYCIN >=8 RESISTANT Resistant     RIFAMPIN <=0.5 SENSITIVE Sensitive     Inducible Clindamycin NEGATIVE Sensitive     * >=100,000 COLONIES/mL METHICILLIN RESISTANT STAPHYLOCOCCUS AUREUS    Radiology Reports Dg Chest 1 View  Result Date: 02/10/2018 CLINICAL DATA:  Hip fracture EXAM: CHEST  1 VIEW COMPARISON:  02/14/2016 FINDINGS: No focal opacity or pleural  effusion. Borderline cardiomegaly. Mild aortic atherosclerosis. No pneumothorax. IMPRESSION: No active disease. Electronically Signed   By: Jasmine Pang M.D.   On: 02/10/2018 00:37   Pelvis Portable  Result Date: 02/10/2018 CLINICAL DATA:  72 year old female status post left femur ORIF for intertrochanteric fracture. EXAM: PORTABLE PELVIS 1-2 VIEWS COMPARISON:  Intraoperative images 15 54 hours today and earlier. FINDINGS: Portable AP supine views  at 1637 hours. Left femur intramedullary rod redemonstrated with proximal interlocking dynamic hip screw. These traverse the comminuted left femur intertrochanteric fracture with stable appearing alignment to the intraoperative images. Superimposed left total knee arthroplasty. No new No acute osseous abnormality identified. Postoperative changes to the left hip and thigh soft tissues. IMPRESSION: 1. Left femur ORIF with no adverse hardware features. 2. No new osseous abnormality identified. Electronically Signed   By: Odessa Fleming M.D.   On: 02/10/2018 18:55   Dg C-arm 1-60 Min  Result Date: 02/10/2018 CLINICAL DATA:  Left hip fracture fixation. EXAM: DG C-ARM 61-120 MIN; LEFT FEMUR 2 VIEWS COMPARISON:  Left femur x-rays dated Feb 09, 2018. FLUOROSCOPY TIME:  Fluoroscopy Time:  1 minutes, 36 seconds. FINDINGS: Multiple intraoperative x-rays demonstrate interval cephalomedullary rod fixation of the left intertrochanteric femur fracture. The lesser trochanter remains displaced. Alignment is otherwise anatomic. Moderate left hip osteoarthritis again noted. IMPRESSION: Left intertrochanteric femur fracture ORIF. Electronically Signed   By: Obie Dredge M.D.   On: 02/10/2018 16:20   Dg Hip Unilat With Pelvis 2-3 Views Left  Result Date: 02/10/2018 CLINICAL DATA:  Left hip pain EXAM: DG HIP (WITH OR WITHOUT PELVIS) 2-3V LEFT COMPARISON:  None. FINDINGS: Calcified phleboliths in the pelvis. The SI joints are non widened. Mild right SI joint degenerative change. Pubic  symphysis and rami appear intact. Both femoral heads project in joint. Acute comminuted left intertrochanteric fracture with displaced lesser trochanter fracture fragment. Moderate arthritis of the left hip with bony remodeling at the femoral head and neck junction. IMPRESSION: Acute comminuted and displaced left intertrochanteric fracture. Electronically Signed   By: Jasmine Pang M.D.   On: 02/10/2018 00:36   Dg Femur Min 2 Views Left  Result Date: 02/10/2018 CLINICAL DATA:  Left hip fracture fixation. EXAM: DG C-ARM 61-120 MIN; LEFT FEMUR 2 VIEWS COMPARISON:  Left femur x-rays dated Feb 09, 2018. FLUOROSCOPY TIME:  Fluoroscopy Time:  1 minutes, 36 seconds. FINDINGS: Multiple intraoperative x-rays demonstrate interval cephalomedullary rod fixation of the left intertrochanteric femur fracture. The lesser trochanter remains displaced. Alignment is otherwise anatomic. Moderate left hip osteoarthritis again noted. IMPRESSION: Left intertrochanteric femur fracture ORIF. Electronically Signed   By: Obie Dredge M.D.   On: 02/10/2018 16:20    Lab Data:  CBC: Recent Labs  Lab 02/10/18 0028 02/10/18 0506 02/11/18 0548 02/11/18 2113 02/12/18 0304  WBC 16.7* 13.1* 10.5 11.9* 12.0*  NEUTROABS 14.5*  --   --   --   --   HGB 13.1 11.6* 9.2* 8.6* 8.4*  HCT 37.5 33.2* 27.2* 25.8* 25.0*  MCV 86.0 85.8 85.5 88.1 87.7  PLT 219 222 203 201 176   Basic Metabolic Panel: Recent Labs  Lab 02/10/18 0028 02/10/18 0506 02/11/18 0548 02/12/18 0304  NA 140 140 137 139  K 4.2 4.8 4.7 3.9  CL 105 104 103 106  CO2 21* 24 20* 24  GLUCOSE 330* 308* 301* 225*  BUN 29*  CREATININE 1.23* 1.20* 1.38* 1.87*  CALCIUM 9.3 8.9 8.5* 8.4*   GFR: Estimated Creatinine Clearance: 29.5 mL/min (A) (by C-G formula based on SCr of 1.87 mg/dL (H)). Liver Function Tests: No results for input(s): AST, ALT, ALKPHOS, BILITOT, PROT, ALBUMIN in the last 168 hours. No results for input(s): LIPASE, AMYLASE in the last  168 hours. No results for input(s): AMMONIA in the last 168 hours. Coagulation Profile: Recent Labs  Lab 02/10/18 0028  INR 0.98   Cardiac Enzymes: No results for input(s):  CKTOTAL, CKMB, CKMBINDEX, TROPONINI in the last 168 hours. BNP (last 3 results) No results for input(s): PROBNP in the last 8760 hours. HbA1C: No results for input(s): HGBA1C in the last 72 hours. CBG: Recent Labs  Lab 02/11/18 1950 02/11/18 2353 02/12/18 0414 02/12/18 0805 02/12/18 1210  GLUCAP 240* 227* 222* 281* 237*   Lipid Profile: No results for input(s): CHOL, HDL, LDLCALC, TRIG, CHOLHDL, LDLDIRECT in the last 72 hours. Thyroid Function Tests: No results for input(s): TSH, T4TOTAL, FREET4, T3FREE, THYROIDAB in the last 72 hours. Anemia Panel: No results for input(s): VITAMINB12, FOLATE, FERRITIN, TIBC, IRON, RETICCTPCT in the last 72 hours. Urine analysis:    Component Value Date/Time   COLORURINE YELLOW 02/10/2018 0533   APPEARANCEUR HAZY (A) 02/10/2018 0533   APPEARANCEUR Clear 12/31/2017 0829   LABSPEC 1.015 02/10/2018 0533   PHURINE 5.0 02/10/2018 0533   GLUCOSEU >=500 (A) 02/10/2018 0533   HGBUR MODERATE (A) 02/10/2018 0533   BILIRUBINUR NEGATIVE 02/10/2018 0533   BILIRUBINUR Negative 12/31/2017 0829   KETONESUR 5 (A) 02/10/2018 0533   PROTEINUR 100 (A) 02/10/2018 0533   UROBILINOGEN 0.2 04/02/2014 1424   NITRITE NEGATIVE 02/10/2018 0533   LEUKOCYTESUR LARGE (A) 02/10/2018 0533   LEUKOCYTESUR 2+ (A) 12/31/2017 0829     Jackie Plum M.D. [facility] 02/12/2018, 2:14 PM  Pager: [phone] Between 7am to 7pm - call Pager - [phone]  After 7pm go to [email or web address] - password [email or web address]  Call night coverage person covering after 7pm

## 2018-02-12 NOTE — Clinical Social Work Note (Signed)
Clinical Social Work Assessment  Patient Details  [name] MRN: [id number] Date of Birth: 1946/07/08  Date of referral:  02/12/18               Reason for consult:  Facility Placement                Permission sought to share information with:  Family Supports Permission granted to share information::  Yes, Verbal Permission Granted  Name::     [name]  Agency::  [facility], [place]  Relationship::  Daughter  Contact Information:  [phone]  Housing/Transportation Living arrangements for the past 2 months:  Single Family Home Source of Information:  Patient, Adult Children Patient Interpreter Needed:  None Criminal Activity/Legal Involvement Pertinent to Current Situation/Hospitalization:  No - Comment as needed Significant Relationships:  Adult Children, Other Family Members, Spouse Lives with:  Spouse Do you feel safe going back to the place where you live?  No Need for family participation in patient care:  No (Coment)  Care giving concerns:  Pt is alert and oriented. Pt lives at home with spouse.    Social Worker assessment / plan:  CSW spoke with pt at bedside. Pt states her spouse is not going to be able to care for her alone. Pt is agreeable to SNF at d/c. Many family members at bedside including daughter [name]) and son Dorise Hiss) who are the main individuals assisting in pt's care--per pt. Per pt and pt's family they would prefer [facility] or [facility]. Family knows staff members at [facility] and will reach out to them. CSW to follow up with facility and pt regarding bed availability once determined.   Employment status:  Retired Database administrator PT Recommendations:  Skilled Nursing Facility Information / Referral to community resources:  Skilled Nursing Facility  Patient/Family's Response to care:  Pt verbalized understanding of CSW role and expressed appreciation for support. Pt denies any concern regarding pt care  at this time.   Patient/Family's Understanding of and Emotional Response to Diagnosis, Current Treatment, and Prognosis:  Pt understanding and realistic regarding physical limitations. Pt understands the need for SNF placement at d/c. Pt agreeable to SNF placement at d/c, at this time. Pt's responses emotionally appropriate during conversation with CSW. Pt denies any concern regarding treatment plan at this time. CSW will continue to provide support and facilitate d/c needs.   Emotional Assessment Appearance:  Appears stated age Attitude/Demeanor/Rapport:  (Patient was appropriate) Affect (typically observed):  Accepting, Appropriate, Calm Orientation:  Oriented to Situation, Oriented to  Time, Oriented to Place, Oriented to Self Alcohol / Substance use:  Not Applicable Psych involvement (Current and /or in the community):  No (Comment)  Discharge Needs  Concerns to be addressed:  Basic Needs, Care Coordination Readmission within the last 30 days:  No Current discharge risk:  Dependent with Mobility Barriers to Discharge:  Continued Medical Work up   [employer], LCSW 02/12/2018, 3:20 PM

## 2018-02-13 ENCOUNTER — Encounter (HOSPITAL_COMMUNITY): Payer: Self-pay | Admitting: Orthopaedic Surgery

## 2018-02-13 DIAGNOSIS — E1165 Type 2 diabetes mellitus with hyperglycemia: Secondary | ICD-10-CM

## 2018-02-13 DIAGNOSIS — N183 Chronic kidney disease, stage 3 (moderate): Secondary | ICD-10-CM

## 2018-02-13 DIAGNOSIS — S72002S Fracture of unspecified part of neck of left femur, sequela: Secondary | ICD-10-CM

## 2018-02-13 LAB — BASIC METABOLIC PANEL
Anion gap: 8 (ref 5–15)
BUN: 24 mg/dL — ABNORMAL HIGH (ref 6–20)
CO2: 23 mmol/L (ref 22–32)
Calcium: 8.6 mg/dL — ABNORMAL LOW (ref 8.9–10.3)
Chloride: 108 mmol/L (ref 101–111)
Creatinine, Ser: 1.49 mg/dL — ABNORMAL HIGH (ref 0.44–1.00)
GFR calc Af Amer: 40 mL/min — ABNORMAL LOW (ref >60)
GFR calc non Af Amer: 34 mL/min — ABNORMAL LOW (ref >60)
Glucose, Bld: 239 mg/dL — ABNORMAL HIGH (ref 65–99)
Potassium: 4.2 mmol/L (ref 3.5–5.1)
Sodium: 139 mmol/L (ref 135–145)

## 2018-02-13 LAB — CBC
HEMATOCRIT: 24.4 % — AB (ref 36.0–46.0)
HEMOGLOBIN: 8 g/dL — AB (ref 12.0–15.0)
MCH: 29.1 pg (ref 26.0–34.0)
MCHC: 32.8 g/dL (ref 30.0–36.0)
MCV: 88.7 fL (ref 78.0–100.0)
Platelets: 190 10*3/uL (ref 150–400)
RBC: 2.75 MIL/uL — AB (ref 3.87–5.11)
RDW: 13.8 % (ref 11.5–15.5)
WBC: 11 10*3/uL — ABNORMAL HIGH (ref 4.0–10.5)

## 2018-02-13 LAB — GLUCOSE, CAPILLARY
GLUCOSE-CAPILLARY: 180 mg/dL — AB (ref 65–99)
GLUCOSE-CAPILLARY: 227 mg/dL — AB (ref 65–99)
GLUCOSE-CAPILLARY: 230 mg/dL — AB (ref 65–99)
GLUCOSE-CAPILLARY: 235 mg/dL — AB (ref 65–99)
GLUCOSE-CAPILLARY: 244 mg/dL — AB (ref 65–99)
Glucose-Capillary: 229 mg/dL — ABNORMAL HIGH (ref 65–99)

## 2018-02-13 MED ORDER — ASPIRIN 325 MG PO TBEC: 325.0000 mg | DELAYED_RELEASE_TABLET | Freq: Every day | ORAL | 0 refills | Status: DC

## 2018-02-13 MED ORDER — OXYCODONE HCL 5 MG PO TABS: 5.0000 mg | ORAL_TABLET | ORAL | 0 refills | Status: DC | PRN

## 2018-02-13 NOTE — Progress Notes (Signed)
Patient ID: Meredith Shaw, female   DOB: [birth year], 72 y.o.   MRN: [id number] No acute changes.  Working slowly with therapy.  Vitals stable.  Denies dizziness.  Hgb 8.0 - acute blood loss anemia from her fracture and surgery.  May need skilled nursing placement.

## 2018-02-13 NOTE — Discharge Instructions (Signed)
Change left hip dressings daily as needed. Full weight as tolerated left hip. Can get incisions wet daily in the shower.

## 2018-02-13 NOTE — Progress Notes (Signed)
[facility]                                                                              Patient Demographics  Meredith Shaw, is a 71 y.o. female, DOB - 1946/09/08, BJY:[id number]  Admit date - 02/09/2018   Admitting Physician Meredith Clos, MD  Outpatient Primary MD for the patient is Meredith Sheriff, MD  Outpatient specialists:   LOS - 3  days    Chief Complaint  Patient presents with  . Fall       Brief summary  Meredith Shaw a 72 y.o.femalewithhistory of diabetes mellitus type 2, hypertension, hyperlipidemia, hypothyroidism presenting left knee pain after mechanical fall during which she sustained displaced left trochanteric femoral fracture for which reason she is admitted   Assessment & Plan    Principal Problem:   Closed left hip fracture (HCC) Active Problems:   Hypertension   Hypothyroidism   Hyperlipidemia, mixed   CKD (chronic kidney disease) stage 3, GFR 30-59 ml/min (HCC)   Controlled type 2 diabetes mellitus with hyperglycemia (HCC)   Hip fracture (HCC)   Pressure injury of skin   Left hip displaced intertrochanteric proximal femur fracture: Status post left hip surgery with screws  and rods placement 02/10/2018 Routine postoperative care Supportive care Orthopedist consulted 02/13/2018: Likely DC to skilled nursing facility with rehab once cleared by orthopedic team.  Hyp ertension:  Continue with hypertensive regimen Continue to optimize.  UTI: 02/13/2018: Urine culture grew MRSA.  I doubt if this is real. -We will repeat urinalysis, urine culture and blood culture.  No systemic symptoms to suggest bacteremia days sitting into the urine.  Will likely discontinue antibiotics.     Diabetes mellitus type 2: -Continue to optimize.    Code Status: Full code DVT Prophylaxis:  SCD's Family Communication: Daughter   Disposition Plan: SNF  Time Spent in minutes  25  minutes  Procedures:  Hip surgery  02/10/2018  Consultants:   Orthopedist  Antimicrobials:   IV vancomycin!     Medications  Scheduled Meds: . amLODipine  10 mg Oral Daily  . aspirin EC  325 mg Oral Q breakfast  . docusate sodium  100 mg Oral BID  . insulin aspart  0-9 Units Subcutaneous Q4H  . insulin detemir  10 Units Subcutaneous QHS  . levothyroxine  137 mcg Oral QAC breakfast  . metoprolol tartrate  100 mg Oral BID  . multivitamin with minerals  1 tablet Oral Daily  . pantoprazole  40 mg Oral Daily   Continuous Infusions: . sodium chloride 75 mL/hr at 02/12/18 0423  . lactated ringers 10 mL/hr at 02/10/18 1816  . methocarbamol (ROBAXIN)  IV    . vancomycin 500 mg (02/13/18 1549)   PRN Meds:.acetaminophen, alum & mag hydroxide-simeth, hydrALAZINE, HYDROmorphone (DILAUDID) injection, menthol-cetylpyridinium **OR** phenol, methocarbamol **OR** methocarbamol (ROBAXIN)  IV, metoCLOPramide **OR** metoCLOPramide (REGLAN) injection, morphine injection, ondansetron **OR** ondansetron (ZOFRAN) IV, oxyCODONE, oxyCODONE   Antibiotics   Anti-infectives (From admission, onward)   Start     Dose/Rate Route Frequency Ordered Stop   02/12/18 1600  vancomycin (VANCOCIN) 500 mg in sodium chloride 0.9 %  100 mL IVPB     500 mg 100 mL/hr over 60 Minutes Intravenous Every 24 hours 02/12/18 1453     02/10/18 0900  cefTRIAXone (ROCEPHIN) 1 g in sodium chloride 0.9 % 100 mL IVPB  Status:  Discontinued     1 g 200 mL/hr over 30 Minutes Intravenous Every 24 hours 02/10/18 0809 02/12/18 1453        Subjective:   Meredith Shaw was seen and examined today.  Patient denies dizziness, chest pain, shortness of breath, no fever or chills Objective:   Vitals:   02/13/18 0446 02/13/18 0905 02/13/18 1458 02/13/18 2034  BP: (!) 146/76 (!) 146/76 127/75 108/67  Pulse: 76  67 72  Resp: 16  18   Temp: 98.3 F (36.8 C)  99 F (37.2 C) 99.7 F (37.6 C)  TempSrc: Oral  Oral Oral  SpO2: 96%  95% 100%  Weight:      Height:         Intake/Output Summary (Last 24 hours) at 02/13/2018 2136 Last data filed at 02/13/2018 1345 Gross per 24 hour  Intake 360 ml  Output 1100 ml  Net -740 ml     Wt Readings from Last 3 Encounters:  02/10/18 77.1 kg (170 lb)  12/31/17 78 kg (172 lb)  07/05/17 80.2 kg (176 lb 12.8 oz)     Exam  General: NAD  HEENT: NCAT,  PERRL,MMM  Neck: SUPPLE, (-) JVD  Cardiovascular: RRR, (-) GALLOP, (-) MURMUR  Respiratory: CTA  Gastrointestinal: SOFT, (-) DISTENSION, BS(+), (_) TENDERNESS  Ext: (-) CYANOSIS, (-) EDEMA  Neuro: A, OX 3  Skin:(-) RASH  Psych:NORMAL AFFECT/MOOD   Data Reviewed:  I have personally reviewed following labs and imaging studies  Micro Results Recent Results (from the past 240 hour(s))  Surgical pcr screen     Status: None   Collection Time: 02/10/18  5:33 AM  Result Value Ref Range Status   MRSA, PCR NEGATIVE NEGATIVE Final   Staphylococcus aureus NEGATIVE NEGATIVE Final    Comment: (NOTE) The Xpert SA Assay (FDA approved for NASAL specimens in patients 93 years of age and older), is one component of a comprehensive surveillance program. It is not intended to diagnose infection nor to guide or monitor treatment. Performed at [facility], [address] [place], [place] [address]   Culture, Urine     Status: Abnormal   Collection Time: 02/10/18 10:07 AM  Result Value Ref Range Status   Specimen Description URINE, RANDOM  Final   Special Requests   Final    NONE Performed at [facility], [address], [place], [place] [address]    Culture (A)  Final    >=100,000 COLONIES/mL METHICILLIN RESISTANT STAPHYLOCOCCUS AUREUS   Report Status 02/12/2018 FINAL  Final   Organism ID, Bacteria METHICILLIN RESISTANT STAPHYLOCOCCUS AUREUS (A)  Final      Susceptibility   Methicillin resistant staphylococcus aureus - MIC*    CIPROFLOXACIN >=8 RESISTANT Resistant     GENTAMICIN <=0.5 SENSITIVE Sensitive     NITROFURANTOIN <=16 SENSITIVE  Sensitive     OXACILLIN >=4 RESISTANT Resistant     TETRACYCLINE <=1 SENSITIVE Sensitive     VANCOMYCIN 1 SENSITIVE Sensitive     TRIMETH/SULFA <=10 SENSITIVE Sensitive     CLINDAMYCIN >=8 RESISTANT Resistant     RIFAMPIN <=0.5 SENSITIVE Sensitive     Inducible Clindamycin NEGATIVE Sensitive     * >=100,000 COLONIES/mL METHICILLIN RESISTANT STAPHYLOCOCCUS AUREUS    Radiology  Reports Dg Chest 1 View  Result Date: 02/10/2018 CLINICAL DATA:  Hip fracture EXAM: CHEST  1 VIEW COMPARISON:  02/14/2016 FINDINGS: No focal opacity or pleural effusion. Borderline cardiomegaly. Mild aortic atherosclerosis. No pneumothorax. IMPRESSION: No active disease. Electronically Signed   By: Jasmine Pang M.D.   On: 02/10/2018 00:37   Pelvis Portable  Result Date: 02/10/2018 CLINICAL DATA:  72 year old female status post left femur ORIF for intertrochanteric fracture. EXAM: PORTABLE PELVIS 1-2 VIEWS COMPARISON:  Intraoperative images 15 54 hours today and earlier. FINDINGS: Portable AP supine views at 1637 hours. Left femur intramedullary rod redemonstrated with proximal interlocking dynamic hip screw. These traverse the comminuted left femur intertrochanteric fracture with stable appearing alignment to the intraoperative images. Superimposed left total knee arthroplasty. No new No acute osseous abnormality identified. Postoperative changes to the left hip and thigh soft tissues. IMPRESSION: 1. Left femur ORIF with no adverse hardware features. 2. No new osseous abnormality identified. Electronically Signed   By: Odessa Fleming M.D.   On: 02/10/2018 18:55   Dg C-arm 1-60 Min  Result Date: 02/10/2018 CLINICAL DATA:  Left hip fracture fixation. EXAM: DG C-ARM 61-120 MIN; LEFT FEMUR 2 VIEWS COMPARISON:  Left femur x-rays dated Feb 09, 2018. FLUOROSCOPY TIME:  Fluoroscopy Time:  1 minutes, 36 seconds. FINDINGS: Multiple intraoperative x-rays demonstrate interval cephalomedullary rod fixation of the left intertrochanteric femur  fracture. The lesser trochanter remains displaced. Alignment is otherwise anatomic. Moderate left hip osteoarthritis again noted. IMPRESSION: Left intertrochanteric femur fracture ORIF. Electronically Signed   By: Obie Dredge M.D.   On: 02/10/2018 16:20   Dg Hip Unilat With Pelvis 2-3 Views Left  Result Date: 02/10/2018 CLINICAL DATA:  Left hip pain EXAM: DG HIP (WITH OR WITHOUT PELVIS) 2-3V LEFT COMPARISON:  None. FINDINGS: Calcified phleboliths in the pelvis. The SI joints are non widened. Mild right SI joint degenerative change. Pubic symphysis and rami appear intact. Both femoral heads project in joint. Acute comminuted left intertrochanteric fracture with displaced lesser trochanter fracture fragment. Moderate arthritis of the left hip with bony remodeling at the femoral head and neck junction. IMPRESSION: Acute comminuted and displaced left intertrochanteric fracture. Electronically Signed   By: Jasmine Pang M.D.   On: 02/10/2018 00:36   Dg Femur Min 2 Views Left  Result Date: 02/10/2018 CLINICAL DATA:  Left hip fracture fixation. EXAM: DG C-ARM 61-120 MIN; LEFT FEMUR 2 VIEWS COMPARISON:  Left femur x-rays dated Feb 09, 2018. FLUOROSCOPY TIME:  Fluoroscopy Time:  1 minutes, 36 seconds. FINDINGS: Multiple intraoperative x-rays demonstrate interval cephalomedullary rod fixation of the left intertrochanteric femur fracture. The lesser trochanter remains displaced. Alignment is otherwise anatomic. Moderate left hip osteoarthritis again noted. IMPRESSION: Left intertrochanteric femur fracture ORIF. Electronically Signed   By: Obie Dredge M.D.   On: 02/10/2018 16:20    Lab Data:  CBC: Recent Labs  Lab 02/10/18 0028 02/10/18 0506 02/11/18 0548 02/11/18 2113 02/12/18 0304 02/13/18 0310  WBC 16.7* 13.1* 10.5 11.9* 12.0* 11.0*  NEUTROABS 14.5*  --   --   --   --   --   HGB 13.1 11.6* 9.2* 8.6* 8.4* 8.0*  HCT 37.5 33.2* 27.2* 25.8* 25.0* 24.4*  MCV 86.0 85.8 85.5 88.1 87.7 88.7  PLT 219  222 203 201 176 190   Basic Metabolic Panel: Recent Labs  Lab 02/10/18 0028 02/10/18 0506 02/11/18 0548 02/12/18 0304 02/13/18 0940  NA 140 140 137 139 139  K 4.2 4.8 4.7 3.9 4.2  CL 105 104  103 106 108  CO2 21* 24 20* 24 23  GLUCOSE 330* 308* 301* 225* 239*  BUN 29* 24*  CREATININE 1.23* 1.20* 1.38* 1.87* 1.49*  CALCIUM 9.3 8.9 8.5* 8.4* 8.6*   GFR: Estimated Creatinine Clearance: 37.1 mL/min (A) (by C-G formula based on SCr of 1.49 mg/dL (H)). Liver Function Tests: No results for input(s): AST, ALT, ALKPHOS, BILITOT, PROT, ALBUMIN in the last 168 hours. No results for input(s): LIPASE, AMYLASE in the last 168 hours. No results for input(s): AMMONIA in the last 168 hours. Coagulation Profile: Recent Labs  Lab 02/10/18 0028  INR 0.98   Cardiac Enzymes: No results for input(s): CKTOTAL, CKMB, CKMBINDEX, TROPONINI in the last 168 hours. BNP (last 3 results) No results for input(s): PROBNP in the last 8760 hours. HbA1C: No results for input(s): HGBA1C in the last 72 hours. CBG: Recent Labs  Lab 02/13/18 0439 02/13/18 0825 02/13/18 1139 02/13/18 1652 02/13/18 2026  GLUCAP 180* 229* 230* 244* 235*   Lipid Profile: No results for input(s): CHOL, HDL, LDLCALC, TRIG, CHOLHDL, LDLDIRECT in the last 72 hours. Thyroid Function Tests: No results for input(s): TSH, T4TOTAL, FREET4, T3FREE, THYROIDAB in the last 72 hours. Anemia Panel: No results for input(s): VITAMINB12, FOLATE, FERRITIN, TIBC, IRON, RETICCTPCT in the last 72 hours. Urine analysis:    Component Value Date/Time   COLORURINE YELLOW 02/10/2018 0533   APPEARANCEUR HAZY (A) 02/10/2018 0533   APPEARANCEUR Clear 12/31/2017 0829   LABSPEC 1.015 02/10/2018 0533   PHURINE 5.0 02/10/2018 0533   GLUCOSEU >=500 (A) 02/10/2018 0533   HGBUR MODERATE (A) 02/10/2018 0533   BILIRUBINUR NEGATIVE 02/10/2018 0533   BILIRUBINUR Negative 12/31/2017 0829   KETONESUR 5 (A) 02/10/2018 0533   PROTEINUR 100 (A)  02/10/2018 0533   UROBILINOGEN 0.2 04/02/2014 1424   NITRITE NEGATIVE 02/10/2018 0533   LEUKOCYTESUR LARGE (A) 02/10/2018 0533   LEUKOCYTESUR 2+ (A) 12/31/2017 4540     Barnetta Chapel M.D. [facility] 02/13/2018, 9:36 PM  Pager: [phone]  Between 7am to 7pm - call Pager.  After 7pm go to [email or web address] - password [email or web address]  Call night coverage person covering after 7pm

## 2018-02-13 NOTE — Progress Notes (Signed)
Physical Therapy Treatment Patient Details Name: TZIREL LEONOR MRN: [id number] DOB: [birth year] Today's Date: 02/13/2018    History of Present Illness Pt is a 72 year old woman admitted after falling while cleaning out her car, resulting in L hip fx. Pt underwent IM nail. PMH: B TKAs, HTN, DM, CKD, hypothyroidism.    PT Comments    Pt is demonstrating ability to assist with mobility but does not initiate well.  PT is giving both verbal and tactile cues and pt is clearly planned appropriately to go to SNF given her level of mobility and independence.  Focus on standing acutely and controlling LLE to move off bed more reliably, and will work toward becoming ambulatory if possible before transition to SNF.   Follow Up Recommendations  SNF     Equipment Recommendations  None recommended by PT    Recommendations for Other Services       Precautions / Restrictions Precautions Precautions: Fall Restrictions Weight Bearing Restrictions: Yes LLE Weight Bearing: Weight bearing as tolerated    Mobility  Bed Mobility Overal bed mobility: Needs Assistance Bed Mobility: Supine to Sit     Supine to sit: Mod assist;HOB elevated     General bed mobility comments: working on scooting   Transfers Overall transfer level: Needs assistance Equipment used: Rolling walker (2 wheeled) Transfers: Sit to/from [employer] Sit to Stand: Mod assist Stand pivot transfers: Max assist;Mod assist       General transfer comment: Pt would not take a step and so PT pivoted her with direct assist.    Ambulation/Gait             General Gait Details: declined to attempt   Stairs             Wheelchair Mobility    Modified Rankin (Stroke Patients Only)       Balance Overall balance assessment: Needs assistance Sitting-balance support: Feet supported Sitting balance-Leahy Scale: Fair     Standing balance support: Bilateral upper extremity supported;During  functional activity Standing balance-Leahy Scale: Poor Standing balance comment: pt is struggling to use walker and to use good hand placement but could assist with this with dense cues and tactile assist                            Cognition Arousal/Alertness: Awake/alert Behavior During Therapy: [facility] for tasks assessed/performed Overall Cognitive Status: Within Functional Limits for tasks assessed                                        Exercises General Exercises - Lower Extremity Ankle Circles/Pumps: AROM;Both;5 reps Quad Sets: AROM;Both;10 reps Hip ABduction/ADduction: AAROM;Both;10 reps    General Comments        Pertinent Vitals/Pain Pain Assessment: 0-10 Pain Score: 7  Pain Location: LLE with movement Pain Descriptors / Indicators: Operative site guarding Pain Intervention(s): Monitored during session;Premedicated before session;Repositioned;Limited activity within patient's tolerance;Patient requesting pain meds-RN notified    Home Living                      Prior Function            PT Goals (current goals can now be found in the care plan section) Acute Rehab PT Goals Patient Stated Goal: to return home Progress towards PT goals: Progressing toward goals  Frequency    Min 3X/week      PT Plan Current plan remains appropriate    Co-evaluation              AM-PAC PT "6 Clicks" Daily Activity  Outcome Measure  Difficulty turning over in bed (including adjusting bedclothes, sheets and blankets)?: Unable Difficulty moving from lying on back to sitting on the side of the bed? : Unable Difficulty sitting down on and standing up from a chair with arms (e.g., wheelchair, bedside commode, etc,.)?: Unable Help needed moving to and from a bed to chair (including a wheelchair)?: A Lot Help needed walking in hospital room?: Total Help needed climbing 3-5 steps with a railing? : Total 6 Click Score: 7    End of  Session Equipment Utilized During Treatment: Gait belt Activity Tolerance: Patient limited by pain Patient left: in chair;with call bell/phone within reach Nurse Communication: Mobility status PT Visit Diagnosis: Other abnormalities of gait and mobility (R26.89);Difficulty in walking, not elsewhere classified (R26.2);Pain Pain - Right/Left: Left Pain - part of body: Leg     Time: 1610-9604 PT Time Calculation (min) (ACUTE ONLY): 33 min  Charges:  $Therapeutic Exercise: 8-22 mins $Therapeutic Activity: 8-22 mins                    G Codes:  Functional Assessment Tool Used: AM-PAC 6 Clicks Basic Mobility     Ivar Drape 02/13/2018, 2:01 PM   Samul Dada, PT MS Acute Rehab Dept. Number: [facility] [id number] and [facility] [phone]

## 2018-02-13 NOTE — Progress Notes (Signed)
Inpatient Diabetes Program Recommendations  AACE/ADA: New Consensus Statement on Inpatient Glycemic Control (2015)  Target Ranges:  Prepandial:   less than 140 mg/dL      Peak postprandial:   less than 180 mg/dL (1-2 hours)      Critically ill patients:  140 - 180 mg/dL   Results for AHNI, BRADWELL (MRN [id number]) as of 02/13/2018 11:15  Ref. Range 02/12/2018 08:05 02/12/2018 12:10 02/12/2018 16:38 02/12/2018 20:09 02/12/2018 23:52 02/13/2018 04:39 02/13/2018 08:25  Glucose-Capillary Latest Ref Range: 65 - 99 mg/dL 409 (H) 811 (H) 914 (H) 222 (H) 227 (H) 180 (H) 229 (H)    Review of Glycemic Control  Diabetes history: DM 2 Outpatient Diabetes medications: Metformin 1000 mg BID Current orders for Inpatient glycemic control: Levemir 10 units qhs, Novolog Sensitive Correction 0-9 units Q4 hours  Inpatient Diabetes Program Recommendations:    Fasting glucose 229 this am after Levemir 10 units given last night at 2307. Please consider increasing Levemir to 12-14 units.  Thanks,  Christena Deem RN, MSN, BC-ADM, [facility] Inpatient Diabetes Coordinator Team Pager [phone] (8a-5p)

## 2018-02-14 ENCOUNTER — Other Ambulatory Visit: Payer: Self-pay | Admitting: Pediatrics

## 2018-02-14 DIAGNOSIS — E118 Type 2 diabetes mellitus with unspecified complications: Secondary | ICD-10-CM | POA: Diagnosis not present

## 2018-02-14 DIAGNOSIS — Z7401 Bed confinement status: Secondary | ICD-10-CM | POA: Diagnosis not present

## 2018-02-14 DIAGNOSIS — I129 Hypertensive chronic kidney disease with stage 1 through stage 4 chronic kidney disease, or unspecified chronic kidney disease: Secondary | ICD-10-CM | POA: Diagnosis not present

## 2018-02-14 DIAGNOSIS — S7292XD Unspecified fracture of left femur, subsequent encounter for closed fracture with routine healing: Secondary | ICD-10-CM | POA: Diagnosis not present

## 2018-02-14 DIAGNOSIS — N183 Chronic kidney disease, stage 3 (moderate): Secondary | ICD-10-CM | POA: Diagnosis not present

## 2018-02-14 DIAGNOSIS — S72142D Displaced intertrochanteric fracture of left femur, subsequent encounter for closed fracture with routine healing: Secondary | ICD-10-CM | POA: Diagnosis not present

## 2018-02-14 DIAGNOSIS — Z7982 Long term (current) use of aspirin: Secondary | ICD-10-CM | POA: Diagnosis not present

## 2018-02-14 DIAGNOSIS — M2550 Pain in unspecified joint: Secondary | ICD-10-CM | POA: Diagnosis not present

## 2018-02-14 DIAGNOSIS — E1165 Type 2 diabetes mellitus with hyperglycemia: Secondary | ICD-10-CM | POA: Diagnosis not present

## 2018-02-14 DIAGNOSIS — E1122 Type 2 diabetes mellitus with diabetic chronic kidney disease: Secondary | ICD-10-CM | POA: Diagnosis not present

## 2018-02-14 DIAGNOSIS — M1990 Unspecified osteoarthritis, unspecified site: Secondary | ICD-10-CM | POA: Diagnosis not present

## 2018-02-14 DIAGNOSIS — E782 Mixed hyperlipidemia: Secondary | ICD-10-CM | POA: Diagnosis not present

## 2018-02-14 DIAGNOSIS — S72002S Fracture of unspecified part of neck of left femur, sequela: Secondary | ICD-10-CM | POA: Diagnosis not present

## 2018-02-14 DIAGNOSIS — E039 Hypothyroidism, unspecified: Secondary | ICD-10-CM | POA: Diagnosis not present

## 2018-02-14 DIAGNOSIS — Z96642 Presence of left artificial hip joint: Secondary | ICD-10-CM | POA: Diagnosis not present

## 2018-02-14 DIAGNOSIS — R52 Pain, unspecified: Secondary | ICD-10-CM | POA: Diagnosis not present

## 2018-02-14 DIAGNOSIS — W19XXXD Unspecified fall, subsequent encounter: Secondary | ICD-10-CM | POA: Diagnosis not present

## 2018-02-14 DIAGNOSIS — Z789 Other specified health status: Secondary | ICD-10-CM | POA: Diagnosis not present

## 2018-02-14 DIAGNOSIS — I10 Essential (primary) hypertension: Secondary | ICD-10-CM | POA: Diagnosis not present

## 2018-02-14 DIAGNOSIS — N3000 Acute cystitis without hematuria: Secondary | ICD-10-CM | POA: Diagnosis not present

## 2018-02-14 LAB — GLUCOSE, CAPILLARY
GLUCOSE-CAPILLARY: 204 mg/dL — AB (ref 65–99)
GLUCOSE-CAPILLARY: 241 mg/dL — AB (ref 65–99)
Glucose-Capillary: 160 mg/dL — ABNORMAL HIGH (ref 65–99)
Glucose-Capillary: 124 mg/dL — ABNORMAL HIGH (ref 65–99)
Glucose-Capillary: 190 mg/dL — ABNORMAL HIGH (ref 65–99)

## 2018-02-14 LAB — BASIC METABOLIC PANEL
Anion gap: 8 (ref 5–15)
BUN: 26 mg/dL — ABNORMAL HIGH (ref 6–20)
CO2: 25 mmol/L (ref 22–32)
Calcium: 8.8 mg/dL — ABNORMAL LOW (ref 8.9–10.3)
Chloride: 109 mmol/L (ref 101–111)
Creatinine, Ser: 1.46 mg/dL — ABNORMAL HIGH (ref 0.44–1.00)
GFR calc Af Amer: 41 mL/min — ABNORMAL LOW (ref >60)
GFR calc non Af Amer: 35 mL/min — ABNORMAL LOW (ref >60)
Glucose, Bld: 151 mg/dL — ABNORMAL HIGH (ref 65–99)
Potassium: 4.1 mmol/L (ref 3.5–5.1)
Sodium: 142 mmol/L (ref 135–145)

## 2018-02-14 LAB — URINALYSIS, ROUTINE W REFLEX MICROSCOPIC
Bilirubin Urine: NEGATIVE
Glucose, UA: NEGATIVE mg/dL
Ketones, ur: NEGATIVE mg/dL
Nitrite: NEGATIVE
Protein, ur: 30 mg/dL — AB
Specific Gravity, Urine: 1.018 (ref 1.005–1.030)
pH: 5 (ref 5.0–8.0)

## 2018-02-14 MED ORDER — AMLODIPINE BESYLATE 10 MG PO TABS: 10.0000 mg | ORAL_TABLET | Freq: Every day | ORAL | 0 refills | Status: DC

## 2018-02-14 MED ORDER — ADULT MULTIVITAMIN W/MINERALS CH: 1.0000 | ORAL_TABLET | Freq: Every day | ORAL | 0 refills | Status: DC

## 2018-02-14 MED ORDER — DOCUSATE SODIUM 100 MG PO CAPS: 100.0000 mg | ORAL_CAPSULE | Freq: Two times a day (BID) | ORAL | 0 refills | Status: DC

## 2018-02-14 MED ORDER — METOPROLOL TARTRATE 100 MG PO TABS: 100.0000 mg | ORAL_TABLET | Freq: Two times a day (BID) | ORAL | 0 refills | Status: DC

## 2018-02-14 NOTE — Care Management Important Message (Signed)
Important Message  Patient Details  [name] MRN: [id number] Date of Birth: [birth year]   Medicare Important Message Given:  Yes    Dorena Bodo 02/14/2018, 3:26 PM

## 2018-02-14 NOTE — Discharge Summary (Signed)
Physician Discharge Summary  Patient ID: Meredith Shaw MRN: [id number] DOB/AGE: 01-14-1946 72 y.o.  Admit date: 02/09/2018 Discharge date: 02/14/2018  Admission Diagnoses:  Discharge Diagnoses:  Principal Problem:   Closed left hip fracture (HCC) Active Problems:   Hypertension   Hypothyroidism   Hyperlipidemia, mixed   CKD (chronic kidney disease) stage 3, GFR 30-59 ml/min (HCC)   Controlled type 2 diabetes mellitus with hyperglycemia (HCC)   Hip fracture (HCC)   Pressure injury of skin   Discharged Condition: stable  Hospital Course: Patient is a 72 year old Caucasian female with past medical history significant for diabetes mellitus type 2, hypertension, hyperlipidemia, and hypothyroidism.  Patient was admitted with the left hip pain following a mechanical fall at home.  Imaging studies revealed displaced left trochanteric femoral fracture.  Left hip displaced intertrochanteric proximal femur fracture: Status post left hip surgery with screws  and rods placement on 02/10/2018 Routine postoperative care Supportive care Orthopedics team directed care  Hyp ertension:  Continue to optimize.  UTI: 02/13/2018: Urine culture grew MRSA.  I doubt this is real. -Will repeat urinalysis, urine culture and blood culture.  No systemic symptoms to suggest bacteremia.  Will discontinue antibiotics.   PCP should follow the results of repeat urinalysis, urine culture and blood culture.   Diabetes mellitus type 2: -Continue to optimize.    Consults: orthopedic surgery  Significant Diagnostic Studies: Hip x-ray revealed acute comminuted and displaced left intertrochanteric fracture.   Discharge Exam: Blood pressure 138/66, pulse 66, temperature 98.9 F (37.2 C), temperature source Oral, resp. rate 17, height 5' 7.01" (1.702 m), weight 77.1 kg (170 lb), SpO2 100 %.   Disposition: Discharge disposition: 03-Skilled Nursing Facility   Discharge Instructions    Call MD for:    Complete by:  As directed    Call MD if symptoms worsen   Diet - low sodium heart healthy   Complete by:  As directed    Diet Carb Modified   Complete by:  As directed    Increase activity slowly   Complete by:  As directed      Allergies as of 02/14/2018      Reactions   Penicillins Itching, Rash      Medication List    STOP taking these medications   aspirin 81 MG tablet Replaced by:  aspirin 325 MG EC tablet   lisinopril 20 MG tablet Commonly known as:  PRINIVIL,ZESTRIL     TAKE these medications   amLODipine 10 MG tablet Commonly known as:  NORVASC Take 1 tablet (10 mg total) by mouth daily. Start taking on:  02/15/2018 What changed:  See the new instructions.   aspirin 325 MG EC tablet Take 1 tablet (325 mg total) by mouth daily with breakfast. Replaces:  aspirin 81 MG tablet   cholecalciferol 1000 units tablet Commonly known as:  VITAMIN D Take 1,000 Units by mouth daily.   docusate sodium 100 MG capsule Commonly known as:  COLACE Take 1 capsule (100 mg total) by mouth 2 (two) times daily.   levothyroxine 137 MCG tablet Commonly known as:  SYNTHROID, LEVOTHROID TAKE ONE (1) TABLET EACH DAY   metFORMIN 1000 MG tablet Commonly known as:  GLUCOPHAGE TAKE ONE TABLET BY MOUTH TWICE DAILY   metoprolol tartrate 100 MG tablet Commonly known as:  LOPRESSOR Take 1 tablet (100 mg total) by mouth 2 (two) times daily.   multivitamin with minerals Tabs tablet Take 1 tablet by mouth daily. Start taking on:  02/15/2018  oxyCODONE 5 MG immediate release tablet Commonly known as:  Oxy IR/ROXICODONE Take 1-2 tablets (5-10 mg total) by mouth every 4 (four) hours as needed for moderate pain (pain score 4-6).   pravastatin 40 MG tablet Commonly known as:  PRAVACHOL TAKE ONE TABLET DAILY AT BEDTIME      Follow-up Information    Kathryne Hitch, MD. Schedule an appointment as soon as possible for a visit in 2 week(s).   Specialty:  Orthopedic Surgery Contact  information: [address] [place] [address] [phone]           Signed: Barnetta Chapel 02/14/2018, 1:55 PM

## 2018-02-14 NOTE — Progress Notes (Signed)
Dr Sallee Provencal paged to notify for the need of completed reconciliation .  Nursing staff unable to print discharge instructions for the patient to go to skilled nursing facility.

## 2018-02-14 NOTE — Social Work (Addendum)
CSW met patient and son at bedside and confirmed accepted bed offer from [facility]. CSW will utilize [facility] for transport when ready. CSW contacted SNF to confirm and that Insurance Auth was initiated. CSW waiting for a call back from SNF.  12:49pm: SNF confirmed bed offer and Insurance Auth. CSW f/u for disposition.   , LCSW Clinical Social Worker [phone]   

## 2018-02-14 NOTE — Clinical Social Work Placement (Signed)
   CLINICAL SOCIAL WORK PLACEMENT  NOTE  Date:  02/14/2018  Patient Details  Name: FALYNN AILEY MRN: [id number] Date of Birth: 18-Mar-1946  Clinical Social Work is seeking post-discharge placement for this patient at the Skilled  Nursing Facility level of care (*CSW will initial, date and re-position this form in  chart as items are completed):  Yes   Patient/family provided with [facility] Clinical Social Work Department's list of facilities offering this level of care within the geographic area requested by the patient (or if unable, by the patient's family).  Yes   Patient/family informed of their freedom to choose among providers that offer the needed level of care, that participate in Medicare, Medicaid or managed care program needed by the patient, have an available bed and are willing to accept the patient.  Yes   Patient/family informed of [facility]'s ownership interest in [facility] and [facility], as well as of the fact that they are under no obligation to receive care at these facilities.  PASRR submitted to EDS on       PASRR number received on 02/12/18     Existing PASRR number confirmed on       FL2 transmitted to all facilities in geographic area requested by pt/family on 02/12/18     FL2 transmitted to all facilities within larger geographic area on       Patient informed that his/her managed care company has contracts with or will negotiate with certain facilities, including the following:        Yes   Patient/family informed of bed offers received.  Patient chooses bed at [facility]     Physician recommends and patient chooses bed at      Patient to be transferred to [facility] on 02/14/18.  Patient to be transferred to facility by [facility]     Patient family notified on 02/14/18 of transfer.  Name of family member notified:  pt responsible for self     PHYSICIAN       Additional Comment:     _______________________________________________ Tresa Moore, LCSW 02/14/2018, 2:34 PM

## 2018-02-14 NOTE — Social Work (Signed)
Clinical Social Worker facilitated patient discharge including contacting patient family and facility to confirm patient discharge plans.  Clinical information faxed to facility and family agreeable with plan.    CSW arranged ambulance transport via [facility] to [facility].    RN to call [phone] to give report prior to discharge. Pt going to Room 504.  Clinical Social Worker will sign off for now as social work intervention is no longer needed. Please consult [place] again if new need arises.  Keene Breath, LCSW Clinical Social Worker [phone]

## 2018-02-14 NOTE — Progress Notes (Addendum)
Report given to Marylene Land, Charity fundraiser.  ETA for [facility] will be around 1600.  Pt and family updated with POC. Pt continue to denied pain and no signs of acute distress noted.  Written paper work and prescriptions will be sent with pt via [facility].

## 2018-02-15 DIAGNOSIS — I10 Essential (primary) hypertension: Secondary | ICD-10-CM | POA: Diagnosis not present

## 2018-02-15 DIAGNOSIS — N3000 Acute cystitis without hematuria: Secondary | ICD-10-CM | POA: Diagnosis not present

## 2018-02-15 DIAGNOSIS — S7292XD Unspecified fracture of left femur, subsequent encounter for closed fracture with routine healing: Secondary | ICD-10-CM | POA: Diagnosis not present

## 2018-02-15 DIAGNOSIS — E118 Type 2 diabetes mellitus with unspecified complications: Secondary | ICD-10-CM | POA: Insufficient documentation

## 2018-02-15 LAB — URINE CULTURE: Culture: NO GROWTH

## 2018-02-18 LAB — CULTURE, BLOOD (ROUTINE X 2)
Culture: NO GROWTH
Culture: NO GROWTH
Special Requests: ADEQUATE
Special Requests: ADEQUATE

## 2018-03-01 DIAGNOSIS — I10 Essential (primary) hypertension: Secondary | ICD-10-CM | POA: Diagnosis not present

## 2018-03-01 DIAGNOSIS — Z789 Other specified health status: Secondary | ICD-10-CM | POA: Diagnosis not present

## 2018-03-01 DIAGNOSIS — Z96642 Presence of left artificial hip joint: Secondary | ICD-10-CM | POA: Diagnosis not present

## 2018-03-01 DIAGNOSIS — R52 Pain, unspecified: Secondary | ICD-10-CM | POA: Diagnosis not present

## 2018-03-07 DIAGNOSIS — E1122 Type 2 diabetes mellitus with diabetic chronic kidney disease: Secondary | ICD-10-CM | POA: Diagnosis not present

## 2018-03-07 DIAGNOSIS — E039 Hypothyroidism, unspecified: Secondary | ICD-10-CM | POA: Diagnosis not present

## 2018-03-07 DIAGNOSIS — I129 Hypertensive chronic kidney disease with stage 1 through stage 4 chronic kidney disease, or unspecified chronic kidney disease: Secondary | ICD-10-CM | POA: Diagnosis not present

## 2018-03-07 DIAGNOSIS — N183 Chronic kidney disease, stage 3 (moderate): Secondary | ICD-10-CM | POA: Diagnosis not present

## 2018-03-07 DIAGNOSIS — Z9181 History of falling: Secondary | ICD-10-CM | POA: Diagnosis not present

## 2018-03-07 DIAGNOSIS — Z7982 Long term (current) use of aspirin: Secondary | ICD-10-CM | POA: Diagnosis not present

## 2018-03-07 DIAGNOSIS — M8000XD Age-related osteoporosis with current pathological fracture, unspecified site, subsequent encounter for fracture with routine healing: Secondary | ICD-10-CM | POA: Diagnosis not present

## 2018-03-07 DIAGNOSIS — E782 Mixed hyperlipidemia: Secondary | ICD-10-CM | POA: Diagnosis not present

## 2018-03-07 DIAGNOSIS — N3000 Acute cystitis without hematuria: Secondary | ICD-10-CM | POA: Diagnosis not present

## 2018-03-07 DIAGNOSIS — S72142D Displaced intertrochanteric fracture of left femur, subsequent encounter for closed fracture with routine healing: Secondary | ICD-10-CM | POA: Diagnosis not present

## 2018-03-07 DIAGNOSIS — M150 Primary generalized (osteo)arthritis: Secondary | ICD-10-CM | POA: Diagnosis not present

## 2018-03-08 ENCOUNTER — Telehealth: Payer: Self-pay | Admitting: Pediatrics

## 2018-03-08 DIAGNOSIS — Z7982 Long term (current) use of aspirin: Secondary | ICD-10-CM | POA: Diagnosis not present

## 2018-03-08 DIAGNOSIS — N183 Chronic kidney disease, stage 3 (moderate): Secondary | ICD-10-CM | POA: Diagnosis not present

## 2018-03-08 DIAGNOSIS — M150 Primary generalized (osteo)arthritis: Secondary | ICD-10-CM | POA: Diagnosis not present

## 2018-03-08 DIAGNOSIS — I129 Hypertensive chronic kidney disease with stage 1 through stage 4 chronic kidney disease, or unspecified chronic kidney disease: Secondary | ICD-10-CM | POA: Diagnosis not present

## 2018-03-08 DIAGNOSIS — E782 Mixed hyperlipidemia: Secondary | ICD-10-CM | POA: Diagnosis not present

## 2018-03-08 DIAGNOSIS — S72142D Displaced intertrochanteric fracture of left femur, subsequent encounter for closed fracture with routine healing: Secondary | ICD-10-CM | POA: Diagnosis not present

## 2018-03-08 DIAGNOSIS — N3000 Acute cystitis without hematuria: Secondary | ICD-10-CM | POA: Diagnosis not present

## 2018-03-08 DIAGNOSIS — E1122 Type 2 diabetes mellitus with diabetic chronic kidney disease: Secondary | ICD-10-CM | POA: Diagnosis not present

## 2018-03-08 DIAGNOSIS — Z9181 History of falling: Secondary | ICD-10-CM | POA: Diagnosis not present

## 2018-03-08 DIAGNOSIS — E039 Hypothyroidism, unspecified: Secondary | ICD-10-CM | POA: Diagnosis not present

## 2018-03-08 DIAGNOSIS — M8000XD Age-related osteoporosis with current pathological fracture, unspecified site, subsequent encounter for fracture with routine healing: Secondary | ICD-10-CM | POA: Diagnosis not present

## 2018-03-08 NOTE — Telephone Encounter (Signed)
appt scheduled for evaluation 

## 2018-03-10 ENCOUNTER — Ambulatory Visit (INDEPENDENT_AMBULATORY_CARE_PROVIDER_SITE_OTHER): Payer: Medicare Other

## 2018-03-10 ENCOUNTER — Encounter: Payer: Self-pay | Admitting: Pediatrics

## 2018-03-10 ENCOUNTER — Ambulatory Visit (INDEPENDENT_AMBULATORY_CARE_PROVIDER_SITE_OTHER): Payer: Medicare Other | Admitting: Pediatrics

## 2018-03-10 VITALS — BP 94/65 | HR 57 | Temp 98.9°F | Ht 67.01 in | Wt 168.0 lb

## 2018-03-10 DIAGNOSIS — E119 Type 2 diabetes mellitus without complications: Secondary | ICD-10-CM | POA: Diagnosis not present

## 2018-03-10 DIAGNOSIS — R319 Hematuria, unspecified: Secondary | ICD-10-CM | POA: Diagnosis not present

## 2018-03-10 DIAGNOSIS — I10 Essential (primary) hypertension: Secondary | ICD-10-CM

## 2018-03-10 DIAGNOSIS — R197 Diarrhea, unspecified: Secondary | ICD-10-CM

## 2018-03-10 DIAGNOSIS — E039 Hypothyroidism, unspecified: Secondary | ICD-10-CM

## 2018-03-10 DIAGNOSIS — Z780 Asymptomatic menopausal state: Secondary | ICD-10-CM | POA: Diagnosis not present

## 2018-03-10 DIAGNOSIS — M810 Age-related osteoporosis without current pathological fracture: Secondary | ICD-10-CM | POA: Diagnosis not present

## 2018-03-10 DIAGNOSIS — M8589 Other specified disorders of bone density and structure, multiple sites: Secondary | ICD-10-CM | POA: Diagnosis not present

## 2018-03-10 DIAGNOSIS — M8000XA Age-related osteoporosis with current pathological fracture, unspecified site, initial encounter for fracture: Secondary | ICD-10-CM

## 2018-03-10 LAB — BAYER DCA HB A1C WAIVED: HB A1C (BAYER DCA - WAIVED): 6.5 % (ref <7.0)

## 2018-03-10 MED ORDER — AMLODIPINE BESYLATE 5 MG PO TABS: 5.0000 mg | ORAL_TABLET | Freq: Every day | ORAL | 5 refills | Status: DC

## 2018-03-10 MED ORDER — LINAGLIPTIN 5 MG PO TABS: 5.0000 mg | ORAL_TABLET | Freq: Every day | ORAL | 3 refills | Status: DC

## 2018-03-10 MED ORDER — ONETOUCH VERIO W/DEVICE KIT: 1.0000 | PACK | Freq: Every day | 0 refills | Status: DC

## 2018-03-10 MED ORDER — GLUCOSE BLOOD VI STRP: ORAL_STRIP | 12 refills | Status: DC

## 2018-03-10 MED ORDER — ONETOUCH ULTRASOFT LANCETS MISC: 12 refills | Status: DC

## 2018-03-10 MED ORDER — METFORMIN HCL ER 500 MG PO TB24: 500.0000 mg | ORAL_TABLET | Freq: Every day | ORAL | 3 refills | Status: DC

## 2018-03-10 NOTE — Addendum Note (Signed)
Addended by: Johna Sheriff on: 03/10/2018 02:58 PM   Modules accepted: Orders

## 2018-03-10 NOTE — Progress Notes (Addendum)
  Subjective:   Patient ID: Meredith Shaw, female    DOB: 1946-02-21, 72 y.o.   MRN: [id number] CC: Diarrhea  HPI: Meredith Shaw is a 72 y.o. female   Osteoporosis: Not on any medication now. Recent fall and hip fracture.  Now in physical therapy through home health.  Strength is been returning, pleased with her progress.  Diarrhea: When she was not on metformin while in the hospital, loose stools after meals improved.  Patient asked about alternative medicine for diabetes.  Loose stools following meals is been ongoing for months.  Diabetes: Taking metformin alone.  Avoiding sugary drinks and foods.  Hypothyroidism: Taking medicine regularly.  Hematuria: While in the hospital she had episode of blood in urine.  Daughter think she was treated for urinary tract infection at the time.  Relevant past medical, surgical, family and social history reviewed. Allergies and medications reviewed and updated. Social History   Tobacco Use  Smoking Status Never Smoker  Smokeless Tobacco Never Used   ROS: Per HPI   Objective:    BP 94/65   Pulse (!) 57   Temp 98.9 F (37.2 C) (Oral)   Ht 5' 7.01" (1.702 m)   Wt 168 lb (76.2 kg)   BMI 26.30 kg/m   Wt Readings from Last 3 Encounters:  03/10/18 168 lb (76.2 kg)  02/10/18 170 lb (77.1 kg)  12/31/17 172 lb (78 kg)    Gen: NAD, alert, cooperative with exam, NCAT EYES: EOMI, no conjunctival injection, or no icterus ENT:  OP without erythema LYMPH: no cervical LAD CV: NRRR, normal S1/S2, no murmur, distal pulses 2+ b/l Resp: CTABL, no wheezes, normal WOB Abd: +BS, soft, NTND.  Ext: No edema, warm Neuro: Alert and oriented Assessment & Plan:  Meredith Shaw was seen today for diarrhea.  Diagnoses and all orders for this visit:  Type 2 diabetes mellitus without complication, without long-term current use of insulin ([place]) Due for A1c.  Stop metformin.  Likely cause of diarrhea.  Januvia $150, Tradjenta $95, both unaffordable.  Will do trial  of extended release Metformin, patient with small amount of weight loss, continue to avoid sugar intake.  Check glucose levels twice a day.  -     linagliptin (TRADJENTA) 5 MG TABS tablet; Take 1 tablet (5 mg total) by mouth daily. -     Bayer DCA Hb A1c Waived  Hematuria, unspecified type Multiple recent UAs with hematuria.  Repeat UA to assure resolution -     Urinalysis, Complete  Osteoporosis with current pathological fracture, unspecified osteoporosis type, initial encounter CKD limits use of bisphosphonates.  Will see if we can get coverage for Prolia. -     VITAMIN D 25 Hydroxy (Vit-D Deficiency, Fractures) -     BMP8+EGFR -     DG WRFM DEXA  Diarrhea, unspecified type Stopping metformin.  Hypertension Blood pressure low today.  Will decrease amlodipine to 5 mg.  Other orders -     Blood Glucose Monitoring Suppl (ONETOUCH VERIO) w/Device KIT; 1 each by Does not apply route daily. -     glucose blood (ONETOUCH VERIO) test strip; Use as instructed -     Lancets (ONETOUCH ULTRASOFT) lancets; Use as instructed   Follow up plan: 4 weeks Meredith Found, MD [place]

## 2018-03-11 LAB — BMP8+EGFR
BUN/Creatinine Ratio: 23 (ref 12–28)
BUN: 32 mg/dL — ABNORMAL HIGH (ref 8–27)
CALCIUM: 9.7 mg/dL (ref 8.7–10.3)
CO2: 19 mmol/L — AB (ref 20–29)
CREATININE: 1.37 mg/dL — AB (ref 0.57–1.00)
Chloride: 106 mmol/L (ref 96–106)
GFR, EST AFRICAN AMERICAN: 45 mL/min/{1.73_m2} — AB (ref >59)
GFR, EST NON AFRICAN AMERICAN: 39 mL/min/{1.73_m2} — AB (ref >59)
Glucose: 227 mg/dL — ABNORMAL HIGH (ref 65–99)
Potassium: 4.6 mmol/L (ref 3.5–5.2)
Sodium: 143 mmol/L (ref 134–144)

## 2018-03-11 LAB — VITAMIN D 25 HYDROXY (VIT D DEFICIENCY, FRACTURES): Vit D, 25-Hydroxy: 40.6 ng/mL (ref 30.0–100.0)

## 2018-03-11 LAB — TSH: TSH: 3.86 u[IU]/mL (ref 0.450–4.500)

## 2018-03-13 DIAGNOSIS — E039 Hypothyroidism, unspecified: Secondary | ICD-10-CM | POA: Diagnosis not present

## 2018-03-13 DIAGNOSIS — N183 Chronic kidney disease, stage 3 (moderate): Secondary | ICD-10-CM | POA: Diagnosis not present

## 2018-03-13 DIAGNOSIS — E782 Mixed hyperlipidemia: Secondary | ICD-10-CM | POA: Diagnosis not present

## 2018-03-13 DIAGNOSIS — E1122 Type 2 diabetes mellitus with diabetic chronic kidney disease: Secondary | ICD-10-CM | POA: Diagnosis not present

## 2018-03-13 DIAGNOSIS — I129 Hypertensive chronic kidney disease with stage 1 through stage 4 chronic kidney disease, or unspecified chronic kidney disease: Secondary | ICD-10-CM | POA: Diagnosis not present

## 2018-03-13 DIAGNOSIS — M150 Primary generalized (osteo)arthritis: Secondary | ICD-10-CM | POA: Diagnosis not present

## 2018-03-13 DIAGNOSIS — M8000XD Age-related osteoporosis with current pathological fracture, unspecified site, subsequent encounter for fracture with routine healing: Secondary | ICD-10-CM | POA: Diagnosis not present

## 2018-03-13 DIAGNOSIS — N3000 Acute cystitis without hematuria: Secondary | ICD-10-CM | POA: Diagnosis not present

## 2018-03-13 DIAGNOSIS — S72142D Displaced intertrochanteric fracture of left femur, subsequent encounter for closed fracture with routine healing: Secondary | ICD-10-CM | POA: Diagnosis not present

## 2018-03-13 DIAGNOSIS — Z9181 History of falling: Secondary | ICD-10-CM | POA: Diagnosis not present

## 2018-03-13 DIAGNOSIS — Z7982 Long term (current) use of aspirin: Secondary | ICD-10-CM | POA: Diagnosis not present

## 2018-03-16 DIAGNOSIS — I129 Hypertensive chronic kidney disease with stage 1 through stage 4 chronic kidney disease, or unspecified chronic kidney disease: Secondary | ICD-10-CM | POA: Diagnosis not present

## 2018-03-16 DIAGNOSIS — S72142D Displaced intertrochanteric fracture of left femur, subsequent encounter for closed fracture with routine healing: Secondary | ICD-10-CM | POA: Diagnosis not present

## 2018-03-16 DIAGNOSIS — E1122 Type 2 diabetes mellitus with diabetic chronic kidney disease: Secondary | ICD-10-CM | POA: Diagnosis not present

## 2018-03-16 DIAGNOSIS — N183 Chronic kidney disease, stage 3 (moderate): Secondary | ICD-10-CM | POA: Diagnosis not present

## 2018-03-16 DIAGNOSIS — M8000XD Age-related osteoporosis with current pathological fracture, unspecified site, subsequent encounter for fracture with routine healing: Secondary | ICD-10-CM | POA: Diagnosis not present

## 2018-03-17 DIAGNOSIS — E039 Hypothyroidism, unspecified: Secondary | ICD-10-CM | POA: Diagnosis not present

## 2018-03-17 DIAGNOSIS — N3000 Acute cystitis without hematuria: Secondary | ICD-10-CM | POA: Diagnosis not present

## 2018-03-17 DIAGNOSIS — S72142D Displaced intertrochanteric fracture of left femur, subsequent encounter for closed fracture with routine healing: Secondary | ICD-10-CM | POA: Diagnosis not present

## 2018-03-17 DIAGNOSIS — Z7982 Long term (current) use of aspirin: Secondary | ICD-10-CM | POA: Diagnosis not present

## 2018-03-17 DIAGNOSIS — I129 Hypertensive chronic kidney disease with stage 1 through stage 4 chronic kidney disease, or unspecified chronic kidney disease: Secondary | ICD-10-CM | POA: Diagnosis not present

## 2018-03-17 DIAGNOSIS — N183 Chronic kidney disease, stage 3 (moderate): Secondary | ICD-10-CM | POA: Diagnosis not present

## 2018-03-17 DIAGNOSIS — Z9181 History of falling: Secondary | ICD-10-CM | POA: Diagnosis not present

## 2018-03-17 DIAGNOSIS — E782 Mixed hyperlipidemia: Secondary | ICD-10-CM | POA: Diagnosis not present

## 2018-03-17 DIAGNOSIS — M150 Primary generalized (osteo)arthritis: Secondary | ICD-10-CM | POA: Diagnosis not present

## 2018-03-17 DIAGNOSIS — M8000XD Age-related osteoporosis with current pathological fracture, unspecified site, subsequent encounter for fracture with routine healing: Secondary | ICD-10-CM | POA: Diagnosis not present

## 2018-03-17 DIAGNOSIS — E1122 Type 2 diabetes mellitus with diabetic chronic kidney disease: Secondary | ICD-10-CM | POA: Diagnosis not present

## 2018-03-20 DIAGNOSIS — E1122 Type 2 diabetes mellitus with diabetic chronic kidney disease: Secondary | ICD-10-CM | POA: Diagnosis not present

## 2018-03-20 DIAGNOSIS — M150 Primary generalized (osteo)arthritis: Secondary | ICD-10-CM | POA: Diagnosis not present

## 2018-03-20 DIAGNOSIS — N3000 Acute cystitis without hematuria: Secondary | ICD-10-CM | POA: Diagnosis not present

## 2018-03-20 DIAGNOSIS — Z7982 Long term (current) use of aspirin: Secondary | ICD-10-CM | POA: Diagnosis not present

## 2018-03-20 DIAGNOSIS — S72142D Displaced intertrochanteric fracture of left femur, subsequent encounter for closed fracture with routine healing: Secondary | ICD-10-CM | POA: Diagnosis not present

## 2018-03-20 DIAGNOSIS — N183 Chronic kidney disease, stage 3 (moderate): Secondary | ICD-10-CM | POA: Diagnosis not present

## 2018-03-20 DIAGNOSIS — I129 Hypertensive chronic kidney disease with stage 1 through stage 4 chronic kidney disease, or unspecified chronic kidney disease: Secondary | ICD-10-CM | POA: Diagnosis not present

## 2018-03-20 DIAGNOSIS — E039 Hypothyroidism, unspecified: Secondary | ICD-10-CM | POA: Diagnosis not present

## 2018-03-20 DIAGNOSIS — M8000XD Age-related osteoporosis with current pathological fracture, unspecified site, subsequent encounter for fracture with routine healing: Secondary | ICD-10-CM | POA: Diagnosis not present

## 2018-03-20 DIAGNOSIS — Z9181 History of falling: Secondary | ICD-10-CM | POA: Diagnosis not present

## 2018-03-20 DIAGNOSIS — E782 Mixed hyperlipidemia: Secondary | ICD-10-CM | POA: Diagnosis not present

## 2018-03-22 DIAGNOSIS — M8000XD Age-related osteoporosis with current pathological fracture, unspecified site, subsequent encounter for fracture with routine healing: Secondary | ICD-10-CM | POA: Diagnosis not present

## 2018-03-22 DIAGNOSIS — N183 Chronic kidney disease, stage 3 (moderate): Secondary | ICD-10-CM | POA: Diagnosis not present

## 2018-03-22 DIAGNOSIS — M150 Primary generalized (osteo)arthritis: Secondary | ICD-10-CM | POA: Diagnosis not present

## 2018-03-22 DIAGNOSIS — Z7982 Long term (current) use of aspirin: Secondary | ICD-10-CM | POA: Diagnosis not present

## 2018-03-22 DIAGNOSIS — S72142D Displaced intertrochanteric fracture of left femur, subsequent encounter for closed fracture with routine healing: Secondary | ICD-10-CM | POA: Diagnosis not present

## 2018-03-22 DIAGNOSIS — E1122 Type 2 diabetes mellitus with diabetic chronic kidney disease: Secondary | ICD-10-CM | POA: Diagnosis not present

## 2018-03-22 DIAGNOSIS — Z9181 History of falling: Secondary | ICD-10-CM | POA: Diagnosis not present

## 2018-03-22 DIAGNOSIS — E782 Mixed hyperlipidemia: Secondary | ICD-10-CM | POA: Diagnosis not present

## 2018-03-22 DIAGNOSIS — N3000 Acute cystitis without hematuria: Secondary | ICD-10-CM | POA: Diagnosis not present

## 2018-03-22 DIAGNOSIS — E039 Hypothyroidism, unspecified: Secondary | ICD-10-CM | POA: Diagnosis not present

## 2018-03-22 DIAGNOSIS — I129 Hypertensive chronic kidney disease with stage 1 through stage 4 chronic kidney disease, or unspecified chronic kidney disease: Secondary | ICD-10-CM | POA: Diagnosis not present

## 2018-03-27 DIAGNOSIS — S72142D Displaced intertrochanteric fracture of left femur, subsequent encounter for closed fracture with routine healing: Secondary | ICD-10-CM | POA: Diagnosis not present

## 2018-03-27 DIAGNOSIS — Z9181 History of falling: Secondary | ICD-10-CM | POA: Diagnosis not present

## 2018-03-27 DIAGNOSIS — E039 Hypothyroidism, unspecified: Secondary | ICD-10-CM | POA: Diagnosis not present

## 2018-03-27 DIAGNOSIS — N183 Chronic kidney disease, stage 3 (moderate): Secondary | ICD-10-CM | POA: Diagnosis not present

## 2018-03-27 DIAGNOSIS — I129 Hypertensive chronic kidney disease with stage 1 through stage 4 chronic kidney disease, or unspecified chronic kidney disease: Secondary | ICD-10-CM | POA: Diagnosis not present

## 2018-03-27 DIAGNOSIS — M150 Primary generalized (osteo)arthritis: Secondary | ICD-10-CM | POA: Diagnosis not present

## 2018-03-27 DIAGNOSIS — Z7982 Long term (current) use of aspirin: Secondary | ICD-10-CM | POA: Diagnosis not present

## 2018-03-27 DIAGNOSIS — E782 Mixed hyperlipidemia: Secondary | ICD-10-CM | POA: Diagnosis not present

## 2018-03-27 DIAGNOSIS — M8000XD Age-related osteoporosis with current pathological fracture, unspecified site, subsequent encounter for fracture with routine healing: Secondary | ICD-10-CM | POA: Diagnosis not present

## 2018-03-27 DIAGNOSIS — N3000 Acute cystitis without hematuria: Secondary | ICD-10-CM | POA: Diagnosis not present

## 2018-03-27 DIAGNOSIS — E1122 Type 2 diabetes mellitus with diabetic chronic kidney disease: Secondary | ICD-10-CM | POA: Diagnosis not present

## 2018-03-29 DIAGNOSIS — M8000XD Age-related osteoporosis with current pathological fracture, unspecified site, subsequent encounter for fracture with routine healing: Secondary | ICD-10-CM | POA: Diagnosis not present

## 2018-03-29 DIAGNOSIS — S72142D Displaced intertrochanteric fracture of left femur, subsequent encounter for closed fracture with routine healing: Secondary | ICD-10-CM | POA: Diagnosis not present

## 2018-03-29 DIAGNOSIS — I129 Hypertensive chronic kidney disease with stage 1 through stage 4 chronic kidney disease, or unspecified chronic kidney disease: Secondary | ICD-10-CM | POA: Diagnosis not present

## 2018-03-29 DIAGNOSIS — N183 Chronic kidney disease, stage 3 (moderate): Secondary | ICD-10-CM | POA: Diagnosis not present

## 2018-03-29 DIAGNOSIS — Z9181 History of falling: Secondary | ICD-10-CM | POA: Diagnosis not present

## 2018-03-29 DIAGNOSIS — Z7982 Long term (current) use of aspirin: Secondary | ICD-10-CM | POA: Diagnosis not present

## 2018-03-29 DIAGNOSIS — E039 Hypothyroidism, unspecified: Secondary | ICD-10-CM | POA: Diagnosis not present

## 2018-03-29 DIAGNOSIS — E1122 Type 2 diabetes mellitus with diabetic chronic kidney disease: Secondary | ICD-10-CM | POA: Diagnosis not present

## 2018-03-29 DIAGNOSIS — N3000 Acute cystitis without hematuria: Secondary | ICD-10-CM | POA: Diagnosis not present

## 2018-03-29 DIAGNOSIS — M150 Primary generalized (osteo)arthritis: Secondary | ICD-10-CM | POA: Diagnosis not present

## 2018-03-29 DIAGNOSIS — E782 Mixed hyperlipidemia: Secondary | ICD-10-CM | POA: Diagnosis not present

## 2018-03-31 ENCOUNTER — Other Ambulatory Visit: Payer: Self-pay | Admitting: Pediatrics

## 2018-04-04 DIAGNOSIS — N3000 Acute cystitis without hematuria: Secondary | ICD-10-CM | POA: Diagnosis not present

## 2018-04-04 DIAGNOSIS — S72142D Displaced intertrochanteric fracture of left femur, subsequent encounter for closed fracture with routine healing: Secondary | ICD-10-CM | POA: Diagnosis not present

## 2018-04-04 DIAGNOSIS — I129 Hypertensive chronic kidney disease with stage 1 through stage 4 chronic kidney disease, or unspecified chronic kidney disease: Secondary | ICD-10-CM | POA: Diagnosis not present

## 2018-04-04 DIAGNOSIS — E039 Hypothyroidism, unspecified: Secondary | ICD-10-CM | POA: Diagnosis not present

## 2018-04-04 DIAGNOSIS — E1122 Type 2 diabetes mellitus with diabetic chronic kidney disease: Secondary | ICD-10-CM | POA: Diagnosis not present

## 2018-04-04 DIAGNOSIS — M150 Primary generalized (osteo)arthritis: Secondary | ICD-10-CM | POA: Diagnosis not present

## 2018-04-04 DIAGNOSIS — N183 Chronic kidney disease, stage 3 (moderate): Secondary | ICD-10-CM | POA: Diagnosis not present

## 2018-04-04 DIAGNOSIS — M8000XD Age-related osteoporosis with current pathological fracture, unspecified site, subsequent encounter for fracture with routine healing: Secondary | ICD-10-CM | POA: Diagnosis not present

## 2018-04-04 DIAGNOSIS — Z7982 Long term (current) use of aspirin: Secondary | ICD-10-CM | POA: Diagnosis not present

## 2018-04-04 DIAGNOSIS — Z9181 History of falling: Secondary | ICD-10-CM | POA: Diagnosis not present

## 2018-04-04 DIAGNOSIS — E782 Mixed hyperlipidemia: Secondary | ICD-10-CM | POA: Diagnosis not present

## 2018-04-05 ENCOUNTER — Encounter: Payer: Self-pay | Admitting: Pediatrics

## 2018-04-05 ENCOUNTER — Ambulatory Visit (INDEPENDENT_AMBULATORY_CARE_PROVIDER_SITE_OTHER): Payer: Medicare Other | Admitting: Pediatrics

## 2018-04-05 VITALS — BP 139/83 | HR 57 | Temp 97.6°F | Ht 67.01 in | Wt 173.8 lb

## 2018-04-05 DIAGNOSIS — M8000XA Age-related osteoporosis with current pathological fracture, unspecified site, initial encounter for fracture: Secondary | ICD-10-CM | POA: Diagnosis not present

## 2018-04-05 DIAGNOSIS — E119 Type 2 diabetes mellitus without complications: Secondary | ICD-10-CM | POA: Diagnosis not present

## 2018-04-05 DIAGNOSIS — I10 Essential (primary) hypertension: Secondary | ICD-10-CM | POA: Diagnosis not present

## 2018-04-05 DIAGNOSIS — M25552 Pain in left hip: Secondary | ICD-10-CM

## 2018-04-05 MED ORDER — METFORMIN HCL ER 500 MG PO TB24: 500.0000 mg | ORAL_TABLET | Freq: Two times a day (BID) | ORAL | 3 refills | Status: DC

## 2018-04-05 NOTE — Progress Notes (Signed)
  Subjective:   Patient ID: Meredith Shaw, female    DOB: 1945/12/15, 72 y.o.   MRN: [id number] CC: Diabetes (4 week recheck)  HPI: Meredith Shaw is a 72 y.o. female   Diabetes: Has been checking her blood sugars at home.  150s to 180s in the morning.  Similar before dinner.  Has been tolerating metformin well.  No diarrhea or stomach symptoms.  Hypertension: Taking blood pressure medicine regularly.  Recent fracture: Still with some hip pain.  Using a walker to get around much of the time.  Sometimes needs a wheelchair in order to be able to get around and complete her ADLs because standing for periods of time increases pain.  She does feel like she is been getting stronger with physical therapy.  Osteoporosis: Due to renal function not a candidate for bisphosphonates.  Relevant past medical, surgical, family and social history reviewed. Allergies and medications reviewed and updated. Social History   Tobacco Use  Smoking Status Never Smoker  Smokeless Tobacco Never Used   ROS: Per HPI   Objective:    BP 139/83   Pulse (!) 57   Temp 97.6 F (36.4 C) (Oral)   Ht 5' 7.01" (1.702 m)   Wt 173 lb 12.8 oz (78.8 kg)   BMI 27.21 kg/m   Wt Readings from Last 3 Encounters:  04/05/18 173 lb 12.8 oz (78.8 kg)  03/10/18 168 lb (76.2 kg)  02/10/18 170 lb (77.1 kg)    Gen: NAD, alert, cooperative with exam, NCAT EYES: EOMI, no conjunctival injection, or no icterus CV: NRRR, normal S1/S2 Resp: CTABL, no wheezes, normal WOB Abd: +BS, soft, NTND. Ext: No edema, warm Neuro: Alert and oriented MSK: normal muscle bulk  Assessment & Plan:  Meredith Shaw was seen today for diabetes.  Diagnoses and all orders for this visit:  Type 2 diabetes mellitus without complication, without long-term current use of insulin (H[facility]) Increase metformin to twice a day, once a day has been well-tolerated.  We will continue to monitor kidney function.  Left hip pain Continue physical therapy, regular  activities. -     For home use only DME standard manual wheelchair with seat cushion  Essential hypertension Stable, continue current medicines  Osteoporosis with current pathological fracture, unspecified osteoporosis type, initial encounter We will see if insurance approves Prolia  Other orders -     metFORMIN (GLUCOPHAGE-XR) 500 MG 24 hr tablet; Take 1 tablet (500 mg total) by mouth 2 (two) times daily.   Follow up plan: Return in about 2 months (around 06/14/2018). Rex Krasarol Kania Regnier, MD [facility]

## 2018-04-07 ENCOUNTER — Ambulatory Visit: Payer: Medicare Other | Admitting: Pediatrics

## 2018-04-11 DIAGNOSIS — M8000XA Age-related osteoporosis with current pathological fracture, unspecified site, initial encounter for fracture: Secondary | ICD-10-CM | POA: Diagnosis not present

## 2018-04-11 DIAGNOSIS — M25552 Pain in left hip: Secondary | ICD-10-CM | POA: Diagnosis not present

## 2018-04-12 DIAGNOSIS — M150 Primary generalized (osteo)arthritis: Secondary | ICD-10-CM | POA: Diagnosis not present

## 2018-04-12 DIAGNOSIS — I129 Hypertensive chronic kidney disease with stage 1 through stage 4 chronic kidney disease, or unspecified chronic kidney disease: Secondary | ICD-10-CM | POA: Diagnosis not present

## 2018-04-12 DIAGNOSIS — E039 Hypothyroidism, unspecified: Secondary | ICD-10-CM | POA: Diagnosis not present

## 2018-04-12 DIAGNOSIS — Z9181 History of falling: Secondary | ICD-10-CM | POA: Diagnosis not present

## 2018-04-12 DIAGNOSIS — N183 Chronic kidney disease, stage 3 (moderate): Secondary | ICD-10-CM | POA: Diagnosis not present

## 2018-04-12 DIAGNOSIS — N3000 Acute cystitis without hematuria: Secondary | ICD-10-CM | POA: Diagnosis not present

## 2018-04-12 DIAGNOSIS — S72142D Displaced intertrochanteric fracture of left femur, subsequent encounter for closed fracture with routine healing: Secondary | ICD-10-CM | POA: Diagnosis not present

## 2018-04-12 DIAGNOSIS — M8000XD Age-related osteoporosis with current pathological fracture, unspecified site, subsequent encounter for fracture with routine healing: Secondary | ICD-10-CM | POA: Diagnosis not present

## 2018-04-12 DIAGNOSIS — Z7982 Long term (current) use of aspirin: Secondary | ICD-10-CM | POA: Diagnosis not present

## 2018-04-12 DIAGNOSIS — E1122 Type 2 diabetes mellitus with diabetic chronic kidney disease: Secondary | ICD-10-CM | POA: Diagnosis not present

## 2018-04-12 DIAGNOSIS — E782 Mixed hyperlipidemia: Secondary | ICD-10-CM | POA: Diagnosis not present

## 2018-04-14 DIAGNOSIS — E782 Mixed hyperlipidemia: Secondary | ICD-10-CM | POA: Diagnosis not present

## 2018-04-14 DIAGNOSIS — M8000XD Age-related osteoporosis with current pathological fracture, unspecified site, subsequent encounter for fracture with routine healing: Secondary | ICD-10-CM | POA: Diagnosis not present

## 2018-04-14 DIAGNOSIS — Z9181 History of falling: Secondary | ICD-10-CM | POA: Diagnosis not present

## 2018-04-14 DIAGNOSIS — N183 Chronic kidney disease, stage 3 (moderate): Secondary | ICD-10-CM | POA: Diagnosis not present

## 2018-04-14 DIAGNOSIS — E039 Hypothyroidism, unspecified: Secondary | ICD-10-CM | POA: Diagnosis not present

## 2018-04-14 DIAGNOSIS — M150 Primary generalized (osteo)arthritis: Secondary | ICD-10-CM | POA: Diagnosis not present

## 2018-04-14 DIAGNOSIS — Z7982 Long term (current) use of aspirin: Secondary | ICD-10-CM | POA: Diagnosis not present

## 2018-04-14 DIAGNOSIS — I129 Hypertensive chronic kidney disease with stage 1 through stage 4 chronic kidney disease, or unspecified chronic kidney disease: Secondary | ICD-10-CM | POA: Diagnosis not present

## 2018-04-14 DIAGNOSIS — S72142D Displaced intertrochanteric fracture of left femur, subsequent encounter for closed fracture with routine healing: Secondary | ICD-10-CM | POA: Diagnosis not present

## 2018-04-14 DIAGNOSIS — E1122 Type 2 diabetes mellitus with diabetic chronic kidney disease: Secondary | ICD-10-CM | POA: Diagnosis not present

## 2018-04-14 DIAGNOSIS — N3000 Acute cystitis without hematuria: Secondary | ICD-10-CM | POA: Diagnosis not present

## 2018-04-17 DIAGNOSIS — Z9181 History of falling: Secondary | ICD-10-CM | POA: Diagnosis not present

## 2018-04-17 DIAGNOSIS — N3000 Acute cystitis without hematuria: Secondary | ICD-10-CM | POA: Diagnosis not present

## 2018-04-17 DIAGNOSIS — E782 Mixed hyperlipidemia: Secondary | ICD-10-CM | POA: Diagnosis not present

## 2018-04-17 DIAGNOSIS — E1122 Type 2 diabetes mellitus with diabetic chronic kidney disease: Secondary | ICD-10-CM | POA: Diagnosis not present

## 2018-04-17 DIAGNOSIS — E039 Hypothyroidism, unspecified: Secondary | ICD-10-CM | POA: Diagnosis not present

## 2018-04-17 DIAGNOSIS — M150 Primary generalized (osteo)arthritis: Secondary | ICD-10-CM | POA: Diagnosis not present

## 2018-04-17 DIAGNOSIS — I129 Hypertensive chronic kidney disease with stage 1 through stage 4 chronic kidney disease, or unspecified chronic kidney disease: Secondary | ICD-10-CM | POA: Diagnosis not present

## 2018-04-17 DIAGNOSIS — N183 Chronic kidney disease, stage 3 (moderate): Secondary | ICD-10-CM | POA: Diagnosis not present

## 2018-04-17 DIAGNOSIS — Z7982 Long term (current) use of aspirin: Secondary | ICD-10-CM | POA: Diagnosis not present

## 2018-04-17 DIAGNOSIS — S72142D Displaced intertrochanteric fracture of left femur, subsequent encounter for closed fracture with routine healing: Secondary | ICD-10-CM | POA: Diagnosis not present

## 2018-04-17 DIAGNOSIS — M8000XD Age-related osteoporosis with current pathological fracture, unspecified site, subsequent encounter for fracture with routine healing: Secondary | ICD-10-CM | POA: Diagnosis not present

## 2018-04-19 DIAGNOSIS — Z9181 History of falling: Secondary | ICD-10-CM | POA: Diagnosis not present

## 2018-04-19 DIAGNOSIS — S72142D Displaced intertrochanteric fracture of left femur, subsequent encounter for closed fracture with routine healing: Secondary | ICD-10-CM | POA: Diagnosis not present

## 2018-04-19 DIAGNOSIS — N183 Chronic kidney disease, stage 3 (moderate): Secondary | ICD-10-CM | POA: Diagnosis not present

## 2018-04-19 DIAGNOSIS — N3000 Acute cystitis without hematuria: Secondary | ICD-10-CM | POA: Diagnosis not present

## 2018-04-19 DIAGNOSIS — M8000XD Age-related osteoporosis with current pathological fracture, unspecified site, subsequent encounter for fracture with routine healing: Secondary | ICD-10-CM | POA: Diagnosis not present

## 2018-04-19 DIAGNOSIS — I129 Hypertensive chronic kidney disease with stage 1 through stage 4 chronic kidney disease, or unspecified chronic kidney disease: Secondary | ICD-10-CM | POA: Diagnosis not present

## 2018-04-19 DIAGNOSIS — M150 Primary generalized (osteo)arthritis: Secondary | ICD-10-CM | POA: Diagnosis not present

## 2018-04-19 DIAGNOSIS — E782 Mixed hyperlipidemia: Secondary | ICD-10-CM | POA: Diagnosis not present

## 2018-04-19 DIAGNOSIS — E039 Hypothyroidism, unspecified: Secondary | ICD-10-CM | POA: Diagnosis not present

## 2018-04-19 DIAGNOSIS — Z7982 Long term (current) use of aspirin: Secondary | ICD-10-CM | POA: Diagnosis not present

## 2018-04-19 DIAGNOSIS — E1122 Type 2 diabetes mellitus with diabetic chronic kidney disease: Secondary | ICD-10-CM | POA: Diagnosis not present

## 2018-04-21 ENCOUNTER — Telehealth: Payer: Self-pay | Admitting: *Deleted

## 2018-04-21 NOTE — Telephone Encounter (Signed)
   Cost to patient would be about $250. Left message with this information at that there are grants available through the [employer] that she may qualify for.

## 2018-04-24 DIAGNOSIS — I129 Hypertensive chronic kidney disease with stage 1 through stage 4 chronic kidney disease, or unspecified chronic kidney disease: Secondary | ICD-10-CM | POA: Diagnosis not present

## 2018-04-24 DIAGNOSIS — E782 Mixed hyperlipidemia: Secondary | ICD-10-CM | POA: Diagnosis not present

## 2018-04-24 DIAGNOSIS — E039 Hypothyroidism, unspecified: Secondary | ICD-10-CM | POA: Diagnosis not present

## 2018-04-24 DIAGNOSIS — S72142D Displaced intertrochanteric fracture of left femur, subsequent encounter for closed fracture with routine healing: Secondary | ICD-10-CM | POA: Diagnosis not present

## 2018-04-24 DIAGNOSIS — E1122 Type 2 diabetes mellitus with diabetic chronic kidney disease: Secondary | ICD-10-CM | POA: Diagnosis not present

## 2018-04-24 DIAGNOSIS — M150 Primary generalized (osteo)arthritis: Secondary | ICD-10-CM | POA: Diagnosis not present

## 2018-04-24 DIAGNOSIS — Z9181 History of falling: Secondary | ICD-10-CM | POA: Diagnosis not present

## 2018-04-24 DIAGNOSIS — N183 Chronic kidney disease, stage 3 (moderate): Secondary | ICD-10-CM | POA: Diagnosis not present

## 2018-04-24 DIAGNOSIS — Z7982 Long term (current) use of aspirin: Secondary | ICD-10-CM | POA: Diagnosis not present

## 2018-04-24 DIAGNOSIS — M8000XD Age-related osteoporosis with current pathological fracture, unspecified site, subsequent encounter for fracture with routine healing: Secondary | ICD-10-CM | POA: Diagnosis not present

## 2018-04-24 DIAGNOSIS — N3000 Acute cystitis without hematuria: Secondary | ICD-10-CM | POA: Diagnosis not present

## 2018-04-26 ENCOUNTER — Other Ambulatory Visit: Payer: Self-pay | Admitting: Pediatrics

## 2018-04-26 ENCOUNTER — Telehealth: Payer: Self-pay | Admitting: Pediatrics

## 2018-04-26 DIAGNOSIS — I129 Hypertensive chronic kidney disease with stage 1 through stage 4 chronic kidney disease, or unspecified chronic kidney disease: Secondary | ICD-10-CM | POA: Diagnosis not present

## 2018-04-26 DIAGNOSIS — M150 Primary generalized (osteo)arthritis: Secondary | ICD-10-CM | POA: Diagnosis not present

## 2018-04-26 DIAGNOSIS — Z9181 History of falling: Secondary | ICD-10-CM | POA: Diagnosis not present

## 2018-04-26 DIAGNOSIS — N3000 Acute cystitis without hematuria: Secondary | ICD-10-CM | POA: Diagnosis not present

## 2018-04-26 DIAGNOSIS — S72142D Displaced intertrochanteric fracture of left femur, subsequent encounter for closed fracture with routine healing: Secondary | ICD-10-CM | POA: Diagnosis not present

## 2018-04-26 DIAGNOSIS — M8000XA Age-related osteoporosis with current pathological fracture, unspecified site, initial encounter for fracture: Secondary | ICD-10-CM

## 2018-04-26 DIAGNOSIS — Z7982 Long term (current) use of aspirin: Secondary | ICD-10-CM | POA: Diagnosis not present

## 2018-04-26 DIAGNOSIS — E039 Hypothyroidism, unspecified: Secondary | ICD-10-CM | POA: Diagnosis not present

## 2018-04-26 DIAGNOSIS — E782 Mixed hyperlipidemia: Secondary | ICD-10-CM | POA: Diagnosis not present

## 2018-04-26 DIAGNOSIS — E1122 Type 2 diabetes mellitus with diabetic chronic kidney disease: Secondary | ICD-10-CM | POA: Diagnosis not present

## 2018-04-26 DIAGNOSIS — N183 Chronic kidney disease, stage 3 (moderate): Secondary | ICD-10-CM | POA: Diagnosis not present

## 2018-04-26 DIAGNOSIS — M8000XD Age-related osteoporosis with current pathological fracture, unspecified site, subsequent encounter for fracture with routine healing: Secondary | ICD-10-CM | POA: Diagnosis not present

## 2018-04-26 NOTE — Telephone Encounter (Signed)
Okay to put in referral. 

## 2018-04-27 ENCOUNTER — Other Ambulatory Visit: Payer: Self-pay

## 2018-04-27 NOTE — Patient Outreach (Signed)
[facility] Care Management  04/27/2018  Meredith Shaw May 10, 1946 [id number]   Medication Adherence call to [name] left a message for patient to call back patient is due on Lisinopril 20 mg and Pravsatatin 40 mg.Mr. Meredith Shaw is showing past due under [employer].   Lillia AbedAna Shaw CPhT Pharmacy Technician [facility] Care Management Direct Dial [phone]  Fax [phone] [email or web address]

## 2018-04-27 NOTE — Telephone Encounter (Signed)
Yes, thanks, physical therapy for balance, recent hip fracture.

## 2018-04-27 NOTE — Telephone Encounter (Signed)
Are we doing PT for hip pain? Please advise?

## 2018-04-27 NOTE — Telephone Encounter (Signed)
Ov 06/06/18

## 2018-04-28 NOTE — Telephone Encounter (Signed)
Meredith Shaw is aware referral placed.

## 2018-05-04 ENCOUNTER — Ambulatory Visit: Payer: Medicare Other | Admitting: Physical Therapy

## 2018-05-08 ENCOUNTER — Other Ambulatory Visit: Payer: Self-pay

## 2018-05-08 ENCOUNTER — Ambulatory Visit: Payer: Medicare Other | Attending: Pediatrics | Admitting: Physical Therapy

## 2018-05-08 ENCOUNTER — Encounter: Payer: Self-pay | Admitting: Physical Therapy

## 2018-05-08 DIAGNOSIS — M6281 Muscle weakness (generalized): Secondary | ICD-10-CM | POA: Diagnosis not present

## 2018-05-08 DIAGNOSIS — M25552 Pain in left hip: Secondary | ICD-10-CM | POA: Insufficient documentation

## 2018-05-08 NOTE — Therapy (Signed)
[facility] [address] [place], [place], [address] Phone: [phone]   Fax:  [phone]  Physical Therapy Evaluation  Patient Details  [name] MRN: [id number] Date of Birth: 03/10/1946 Referring Provider: Rex Krasarol Vincent MD.   Encounter Date: 05/08/2018  PT End of Session - 05/08/18 1023    Visit Number  1    Number of Visits  16    Date for PT Re-Evaluation  08/06/18    PT Start Time  0856    PT Stop Time  0943    PT Time Calculation (min)  47 min    Activity Tolerance  Patient tolerated treatment well    Behavior During Therapy  [facility] for tasks assessed/performed       Past Medical History:  Diagnosis Date  . Arthritis    OA AND PAIN BOTH KNEES -RIGHT KNEE HURTS WORSE THAN LEFT  . Diabetes mellitus    ORAL MEDICATION - NO INSULIN  . Hypertension   . Hypothyroidism   . Kidney stones    NONE AT PRESENT TIME THAT PT IS AWARE OF    Past Surgical History:  Procedure Laterality Date  . ABDOMINAL HYSTERECTOMY    . CHOLECYSTECTOMY  1972  . COLONOSCOPY    . EYE SURGERY     RIGHT CATARACT EXTRACTON WITH LENS IMPLANT  . GROWTH REMOVED FROM THYROID    . INTRAMEDULLARY (IM) NAIL INTERTROCHANTERIC Left 02/10/2018   Procedure: INTRAMEDULLARY (IM) NAIL INTERTROCHANTRIC;  Surgeon: Kathryne HitchBlackman, Christopher Y, MD;  Location: [facility] OR;  Service: Orthopedics;  Laterality: Left;  . PERCUTANEOUS NEPHROLITHOTOMY    . TOTAL KNEE ARTHROPLASTY  06/09/2012   Procedure: TOTAL KNEE ARTHROPLASTY;  Surgeon: Kathryne Hitchhristopher Y Blackman, MD;  Location: [facility] ORS;  Service: Orthopedics;  Laterality: Right;  Right Total Knee Arthroplasty  . TOTAL KNEE ARTHROPLASTY Left 04/09/2014   Procedure: LEFT TOTAL KNEE ARTHROPLASTY;  Surgeon: Kathryne Hitchhristopher Y Blackman, MD;  [facility];  Service: Orthopedics;  Laterality: Left;  . TUBAL LIGATION      There were no vitals filed for this visit.   Subjective Assessment - 05/08/18 1058    Subjective  The patient  reports about 6 weeks ago she was wearing sandels outside and was backing up when the sandel caught and she feel.  She sustained a left hip fracture and then underwent an ORIF.  She presents to the clinic today with a pain-level of 4/10.   She has also noted increased swelling over her left LE since surgery.  Her goal is to walk without an assisitve device again.  Pain increases with increased up time and pain decreases with rest.         Pertinent History  DM; OP.    How long can you walk comfortably?  Short distances with walker.    Patient Stated Goals  See above.    Currently in Pain?  Yes    Pain Score  4     Pain Location  Hip    Pain Orientation  Left    Pain Descriptors / Indicators  Aching    Pain Type  Acute pain    Pain Onset  More than a month ago    Pain Frequency  Constant    Aggravating Factors   See above.    Pain Relieving Factors  See above.         [facility] PT Assessment - 05/08/18 0001      Assessment   Medical Diagnosis  Left hip  fracture; Balance disorder.    Referring Provider  Rex Kras MD.    Onset Date/Surgical Date  -- ~6 weeks ago.      Precautions   Precautions  Fall      Restrictions   Weight Bearing Restrictions  No      Balance Screen   Has the patient fallen in the past 6 months  Yes    How many times?  -- 1.    Has the patient had a decrease in activity level because of a fear of falling?   Yes    Is the patient reluctant to leave their home because of a fear of falling?   No      Prior Function   Level of Independence  Independent      Posture/Postural Control   Posture/Postural Control  No significant limitations      ROM / Strength   AROM / PROM / Strength  AROM;Strength      AROM   Overall AROM Comments  Left hip flexion to 78 degrees.      Strength   Overall Strength Comments  Left hip abduction= 4-/5.      Palpation   Palpation comment  Tender to palpation around left hip incisional site and associated musculature.       Ambulation/Gait   Gait Comments  Minimal Trendelenburg gait pattern.  Patine using a FWW.                Objective measurements completed on examination: See above findings.      [facility] Adult PT Treatment/Exercise - 05/08/18 0001      Modalities   Modalities  Electrical Stimulation;Moist Heat      Moist Heat Therapy   Number Minutes Moist Heat  15 Minutes    Moist Heat Location  -- Left hip.      Programme researcher, broadcasting/film/video Location  -- Left hip.    Electrical Stimulation Action  IFC    Electrical Stimulation Parameters  80-150 Hz x 15 minutes.    Electrical Stimulation Goals  Pain               PT Short Term Goals - 05/08/18 1151      PT SHORT TERM GOAL #1   Title  STG's=LTG's.        PT Long Term Goals - 05/08/18 1152      PT LONG TERM GOAL #1   Title  Ind with an HEP.    Time  8    Period  Weeks    Status  New      PT LONG TERM GOAL #2   Title  5/5 left hip abduction strength to increase stability for functional tasks.    Time  8    Period  Weeks    Status  New      PT LONG TERM GOAL #3   Title  Walk in clinic 500 feet with straight cane.    Time  8    Period  Weeks    Status  New      PT LONG TERM GOAL #4   Title  Perform a reciprocating stair gait with one railing.    Time  8    Period  Weeks    Status  New             Plan - 05/08/18 1147    Clinical Impression Statement  The patient presents to [facility] s/p left hip ORIF.  She is using a FWW.  She has some left hip abduction weakness at this time.  Due to pain and deficits the patient has impaired fucntional mobility.  Patient will benefit from skilled physical therapy intervention to address deficits.      History and Personal Factors relevant to plan of care:  DM; OP.    Clinical Presentation  Stable    Clinical Decision Making  Low    Rehab Potential  Excellent    PT Frequency  2x / week    PT Duration  8 weeks    PT Treatment/Interventions   ADLs/Self Care Home Management;Cryotherapy;Electrical Stimulation;Ultrasound;Functional mobility training;Therapeutic activities;Therapeutic exercise;Neuromuscular re-education;Patient/family education;Manual techniques    PT Next Visit Plan  Left hip strengthening; gait training; Nustep/staionary bike.  Modalites and STW/MPRN.    Consulted and Agree with Plan of Care  Patient       Patient will benefit from skilled therapeutic intervention in order to improve the following deficits and impairments:  Abnormal gait, Decreased activity tolerance, Decreased range of motion, Decreased strength, Pain  Visit Diagnosis: Pain in left hip - Plan: PT plan of care cert/re-cert  Muscle weakness (generalized) - Plan: PT plan of care cert/re-cert     Problem List Patient Active Problem List   Diagnosis Date Noted  . Closed left hip fracture (HCC) 02/10/2018  . CKD (chronic kidney disease) stage 3, GFR 30-59 ml/min (HCC) 02/10/2018  . Controlled type 2 diabetes mellitus with hyperglycemia (HCC) 02/10/2018  . Hip fracture (HCC) 02/10/2018  . Pressure injury of skin 02/10/2018  . Chest pain 02/19/2016  . Arthritis of left knee 04/09/2014  . Status post total knee replacement 04/09/2014  . Arthropathy   . Diabetes (HCC)   . Hypertension   . Kidney stones   . Hypothyroidism   . Degenerative arthritis of knee 06/09/2012  . Flatulence, eructation, and gas pain 12/24/2005  . Abdominal or pelvic swelling, mass, or lump, other specified site 12/24/2005  . Osteoporosis 12/04/2004  . Symptomatic menopausal or female climacteric states 12/04/2004  . Arthropathy, lower leg 04/21/2004  . Hematuria 09/17/2003  . Other long term (current) drug therapy 07/05/2002  . Hyperlipidemia, mixed 03/01/2000    Adisa Litt, [place] MPT 05/08/2018, 11:54 AM  [facility] [address] [place], [place], [address] Phone: [phone]   Fax:  [phone]  Name: AMANDAMARIE FEGGINS MRN: [id number] Date of Birth: [birth year]

## 2018-05-10 ENCOUNTER — Encounter: Payer: Self-pay | Admitting: Physical Therapy

## 2018-05-10 ENCOUNTER — Ambulatory Visit: Payer: Medicare Other | Admitting: Physical Therapy

## 2018-05-10 DIAGNOSIS — M6281 Muscle weakness (generalized): Secondary | ICD-10-CM

## 2018-05-10 DIAGNOSIS — M25552 Pain in left hip: Secondary | ICD-10-CM | POA: Diagnosis not present

## 2018-05-10 NOTE — Therapy (Signed)
[facility] [address] [place], [place], [address] Phone: [phone]   Fax:  [phone]  Physical Therapy Treatment  Patient Details  [name] MRN: [id number] Date of Birth: 04-17-1946 Referring Provider: Rex Krasarol Vincent MD.   Encounter Date: 05/10/2018  PT End of Session - 05/10/18 1210    Visit Number  2    Number of Visits  16    Date for PT Re-Evaluation  08/06/18    PT Start Time  0945    PT Stop Time  1041    PT Time Calculation (min)  56 min       Past Medical History:  Diagnosis Date  . Arthritis    OA AND PAIN BOTH KNEES -RIGHT KNEE HURTS WORSE THAN LEFT  . Diabetes mellitus    ORAL MEDICATION - NO INSULIN  . Hypertension   . Hypothyroidism   . Kidney stones    NONE AT PRESENT TIME THAT PT IS AWARE OF    Past Surgical History:  Procedure Laterality Date  . ABDOMINAL HYSTERECTOMY    . CHOLECYSTECTOMY  1972  . COLONOSCOPY    . EYE SURGERY     RIGHT CATARACT EXTRACTON WITH LENS IMPLANT  . GROWTH REMOVED FROM THYROID    . INTRAMEDULLARY (IM) NAIL INTERTROCHANTERIC Left 02/10/2018   Procedure: INTRAMEDULLARY (IM) NAIL INTERTROCHANTRIC;  Surgeon: Kathryne HitchBlackman, Christopher Y, MD;  Location: [facility] OR;  Service: Orthopedics;  Laterality: Left;  . PERCUTANEOUS NEPHROLITHOTOMY    . TOTAL KNEE ARTHROPLASTY  06/09/2012   Procedure: TOTAL KNEE ARTHROPLASTY;  Surgeon: Kathryne Hitchhristopher Y Blackman, MD;  Location: [facility] ORS;  Service: Orthopedics;  Laterality: Right;  Right Total Knee Arthroplasty  . TOTAL KNEE ARTHROPLASTY Left 04/09/2014   Procedure: LEFT TOTAL KNEE ARTHROPLASTY;  Surgeon: Kathryne Hitchhristopher Y Blackman, MD;  [facility];  Service: Orthopedics;  Laterality: Left;  . TUBAL LIGATION      There were no vitals filed for this visit.  Subjective Assessment - 05/10/18 1210    Subjective  No new complaints.  Patient enjoyed the heat and electrical stimulation.    Currently in Pain?  Yes    Pain Score  4     Pain  Location  Hip    Pain Orientation  Left    Pain Descriptors / Indicators  Aching    Pain Type  Acute pain    Pain Onset  More than a month ago                       [facility] Adult PT Treatment/Exercise - 05/10/18 0001      Exercises   Exercises  Knee/Hip      Knee/Hip Exercises: Aerobic   Nustep  Level 3 x 15 minutes monitored and moving forward x 2 to gently increase hip flexion.      Knee/Hip Exercises: Supine   Other Supine Knee/Hip Exercises  In supine:  Yellow Theraband hip abduction to fatigue x 2 then SLR and supine hip abduction to fatigue x 2 f/b 5# SAQ's x 3 minutes.      Modalities   Modalities  Electrical Stimulation;Moist Heat      Moist Heat Therapy   Number Minutes Moist Heat  20 Minutes    Moist Heat Location  -- Left hip.      Programme researcher, broadcasting/film/videolectrical Stimulation   Electrical Stimulation Location  -- Left hip.    Electrical Stimulation Action  IFC    Electrical Stimulation Parameters  80-150 Hz  x 20 minutes.    Electrical Stimulation Goals  Tone;Pain               PT Short Term Goals - 05/08/18 1151      PT SHORT TERM GOAL #1   Title  STG's=LTG's.        PT Long Term Goals - 05/08/18 1152      PT LONG TERM GOAL #1   Title  Ind with an HEP.    Time  8    Period  Weeks    Status  New      PT LONG TERM GOAL #2   Title  5/5 left hip abduction strength to increase stability for functional tasks.    Time  8    Period  Weeks    Status  New      PT LONG TERM GOAL #3   Title  Walk in clinic 500 feet with straight cane.    Time  8    Period  Weeks    Status  New      PT LONG TERM GOAL #4   Title  Perform a reciprocating stair gait with one railing.    Time  8    Period  Weeks    Status  New            Plan - 05/10/18 1216    Clinical Impression Statement  Excellent job today with ther ex.  Patient felt better after treatment and states the heat and electrical stimulation helps.    PT Treatment/Interventions  ADLs/Self Care Home  Management;Cryotherapy;Electrical Stimulation;Ultrasound;Functional mobility training;Therapeutic activities;Therapeutic exercise;Neuromuscular re-education;Patient/family education;Manual techniques    PT Next Visit Plan  Left hip strengthening; gait training; Nustep/staionary bike.  Modalites and STW/MPRN.    Consulted and Agree with Plan of Care  Patient       Patient will benefit from skilled therapeutic intervention in order to improve the following deficits and impairments:     Visit Diagnosis: Pain in left hip  Muscle weakness (generalized)     Problem List Patient Active Problem List   Diagnosis Date Noted  . Closed left hip fracture (HCC) 02/10/2018  . CKD (chronic kidney disease) stage 3, GFR 30-59 ml/min (HCC) 02/10/2018  . Controlled type 2 diabetes mellitus with hyperglycemia (HCC) 02/10/2018  . Hip fracture (HCC) 02/10/2018  . Pressure injury of skin 02/10/2018  . Chest pain 02/19/2016  . Arthritis of left knee 04/09/2014  . Status post total knee replacement 04/09/2014  . Arthropathy   . Diabetes (HCC)   . Hypertension   . Kidney stones   . Hypothyroidism   . Degenerative arthritis of knee 06/09/2012  . Flatulence, eructation, and gas pain 12/24/2005  . Abdominal or pelvic swelling, mass, or lump, other specified site 12/24/2005  . Osteoporosis 12/04/2004  . Symptomatic menopausal or female climacteric states 12/04/2004  . Arthropathy, lower leg 04/21/2004  . Hematuria 09/17/2003  . Other long term (current) drug therapy 07/05/2002  . Hyperlipidemia, mixed 03/01/2000    Keisha Amer, [place]  MPT 05/10/2018, 12:18 PM  [facility] [address] [place], [place], [address] Phone: [phone]   Fax:  [phone]  [name] MRN: [id number] Date of Birth: Feb 26, 1946

## 2018-05-12 DIAGNOSIS — M8000XA Age-related osteoporosis with current pathological fracture, unspecified site, initial encounter for fracture: Secondary | ICD-10-CM | POA: Diagnosis not present

## 2018-05-12 DIAGNOSIS — M25552 Pain in left hip: Secondary | ICD-10-CM | POA: Diagnosis not present

## 2018-05-16 ENCOUNTER — Encounter: Payer: Self-pay | Admitting: *Deleted

## 2018-05-16 ENCOUNTER — Ambulatory Visit: Payer: Medicare Other | Attending: Pediatrics | Admitting: *Deleted

## 2018-05-16 DIAGNOSIS — M25552 Pain in left hip: Secondary | ICD-10-CM

## 2018-05-16 DIAGNOSIS — M6281 Muscle weakness (generalized): Secondary | ICD-10-CM | POA: Insufficient documentation

## 2018-05-16 NOTE — Therapy (Signed)
[facility] [address] [place], [place], [address] Phone: [phone]   Fax:  [phone]  Physical Therapy Treatment  Patient Details  [name] MRN: [id number] Date of Birth: 1946-01-02 Referring Provider: Rex Kras MD.   Encounter Date: 05/16/2018  PT End of Session - 05/16/18 0937    Visit Number  3    Number of Visits  16    Date for PT Re-Evaluation  08/06/18    PT Start Time  0945    PT Stop Time  1035    PT Time Calculation (min)  50 min       Past Medical History:  Diagnosis Date  . Arthritis    OA AND PAIN BOTH KNEES -RIGHT KNEE HURTS WORSE THAN LEFT  . Diabetes mellitus    ORAL MEDICATION - NO INSULIN  . Hypertension   . Hypothyroidism   . Kidney stones    NONE AT PRESENT TIME THAT PT IS AWARE OF    Past Surgical History:  Procedure Laterality Date  . ABDOMINAL HYSTERECTOMY    . CHOLECYSTECTOMY  1972  . COLONOSCOPY    . EYE SURGERY     RIGHT CATARACT EXTRACTON WITH LENS IMPLANT  . GROWTH REMOVED FROM THYROID    . INTRAMEDULLARY (IM) NAIL INTERTROCHANTERIC Left 02/10/2018   Procedure: INTRAMEDULLARY (IM) NAIL INTERTROCHANTRIC;  Surgeon: Kathryne Hitch, MD;  Location: [facility] OR;  Service: Orthopedics;  Laterality: Left;  . PERCUTANEOUS NEPHROLITHOTOMY    . TOTAL KNEE ARTHROPLASTY  06/09/2012   Procedure: TOTAL KNEE ARTHROPLASTY;  Surgeon: Kathryne Hitch, MD;  Location: [facility];  Service: Orthopedics;  Laterality: Right;  Right Total Knee Arthroplasty  . TOTAL KNEE ARTHROPLASTY Left 04/09/2014   Procedure: LEFT TOTAL KNEE ARTHROPLASTY;  Surgeon: Kathryne Hitch, MD;  Location[facility] OR;  Service: Orthopedics;  Laterality: Left;  . TUBAL LIGATION      There were no vitals filed for this visit.  Subjective Assessment - 05/16/18 0937    Subjective  No new complaints.  Patient enjoyed the heat and electrical stimulation.    Pertinent History  DM; OP.    How long can you walk  comfortably?  Short distances with walker.    Patient Stated Goals  See above.    Currently in Pain?  Yes    Pain Score  4     Pain Location  Hip    Pain Orientation  Left    Pain Descriptors / Indicators  Aching    Pain Type  Acute pain    Pain Onset  More than a month ago    Pain Frequency  Constant                       [facility] Adult PT Treatment/Exercise - 05/16/18 0001      Ambulation/Gait   Ambulation/Gait  Yes    Ambulation Distance (Feet)  200 Feet    Assistive device  Straight cane    Gait Pattern  Decreased arm swing - right;Decreased arm swing - left;Decreased step length - right;Decreased step length - left;Decreased stance time - left    Gait Comments  Minimal Trendelenburg gait pattern.  Patine using a SPC      Exercises   Exercises  Knee/Hip      Knee/Hip Exercises: Aerobic   Nustep  Level 3 x 15 minutes monitored and moving forward x 2 to gently increase hip flexion.      Knee/Hip Exercises:  Standing   Rocker Board  5 minutes      Modalities   Modalities  Electrical Stimulation;Moist Heat      Moist Heat Therapy   Number Minutes Moist Heat  15 Minutes    Moist Heat Location  -- Left hip.      Programme researcher, broadcasting/film/video Location  LT hip  IFC x 15 mins 80-150hz  x 15 mins    Electrical Stimulation Goals  Tone;Pain               PT Short Term Goals - 05/08/18 1151      PT SHORT TERM GOAL #1   Title  STG's=LTG's.        PT Long Term Goals - 05/08/18 1152      PT LONG TERM GOAL #1   Title  Ind with an HEP.    Time  8    Period  Weeks    Status  New      PT LONG TERM GOAL #2   Title  5/5 left hip abduction strength to increase stability for functional tasks.    Time  8    Period  Weeks    Status  New      PT LONG TERM GOAL #3   Title  Walk in clinic 500 feet with straight cane.    Time  8    Period  Weeks    Status  New      PT LONG TERM GOAL #4   Title  Perform a reciprocating stair gait with  one railing.    Time  8    Period  Weeks    Status  New            Plan - 05/16/18 1610    Clinical Impression Statement  Pt arrived today doing fairly well with 5/10hip pain and ambulating with FWW. Gait with SPC was practiced in clinic today with mild trendelenburg gait pattern and shortened stride length, but no LOB.  She did well on rocker board , but foung it challenging to maintain balance. Normal modality response.    Clinical Presentation  Stable    Clinical Decision Making  Low    Rehab Potential  Excellent    PT Frequency  2x / week    PT Duration  8 weeks    PT Treatment/Interventions  ADLs/Self Care Home Management;Cryotherapy;Electrical Stimulation;Ultrasound;Functional mobility training;Therapeutic activities;Therapeutic exercise;Neuromuscular re-education;Patient/family education;Manual techniques    PT Next Visit Plan  Left hip strengthening; gait training; Nustep/staionary bike.  Modalites and STW/MPRN.       Patient will benefit from skilled therapeutic intervention in order to improve the following deficits and impairments:  Abnormal gait, Decreased activity tolerance, Decreased range of motion, Decreased strength, Pain  Visit Diagnosis: Pain in left hip  Muscle weakness (generalized)     Problem List Patient Active Problem List   Diagnosis Date Noted  . Closed left hip fracture (HCC) 02/10/2018  . CKD (chronic kidney disease) stage 3, GFR 30-59 ml/min (HCC) 02/10/2018  . Controlled type 2 diabetes mellitus with hyperglycemia (HCC) 02/10/2018  . Hip fracture (HCC) 02/10/2018  . Pressure injury of skin 02/10/2018  . Chest pain 02/19/2016  . Arthritis of left knee 04/09/2014  . Status post total knee replacement 04/09/2014  . Arthropathy   . Diabetes (HCC)   . Hypertension   . Kidney stones   . Hypothyroidism   . Degenerative arthritis of knee 06/09/2012  . Flatulence, eructation, and gas  pain 12/24/2005  . Abdominal or pelvic swelling, mass, or  lump, other specified site 12/24/2005  . Osteoporosis 12/04/2004  . Symptomatic menopausal or female climacteric states 12/04/2004  . Arthropathy, lower leg 04/21/2004  . Hematuria 09/17/2003  . Other long term (current) drug therapy 07/05/2002  . Hyperlipidemia, mixed 03/01/2000    Dawnna Gritz,CHRIS, PTA 05/16/2018, 11:50 AM  [facility] [address] [place], [place], [address] Phone: [phone]   Fax:  [phone]  [name] MRN: [id number] Date of Birth: 08-10-1946

## 2018-05-18 ENCOUNTER — Ambulatory Visit: Payer: Medicare Other | Admitting: *Deleted

## 2018-05-18 DIAGNOSIS — M25552 Pain in left hip: Secondary | ICD-10-CM

## 2018-05-18 DIAGNOSIS — M6281 Muscle weakness (generalized): Secondary | ICD-10-CM | POA: Diagnosis not present

## 2018-05-18 NOTE — Therapy (Signed)
[facility] [address] [place], [place], [address] Phone: [phone]   Fax:  [phone]  Physical Therapy Treatment  Patient Details  [name] MRN: [id number] Date of Birth: [birth year] Referring Provider: Rex Krasarol Vincent MD.   Encounter Date: 05/18/2018  PT End of Session - 05/18/18 0906    Visit Number  4    Number of Visits  16    Date for PT Re-Evaluation  08/06/18    PT Start Time  0900    PT Stop Time  1000    PT Time Calculation (min)  60 min       Past Medical History:  Diagnosis Date  . Arthritis    OA AND PAIN BOTH KNEES -RIGHT KNEE HURTS WORSE THAN LEFT  . Diabetes mellitus    ORAL MEDICATION - NO INSULIN  . Hypertension   . Hypothyroidism   . Kidney stones    NONE AT PRESENT TIME THAT PT IS AWARE OF    Past Surgical History:  Procedure Laterality Date  . ABDOMINAL HYSTERECTOMY    . CHOLECYSTECTOMY  1972  . COLONOSCOPY    . EYE SURGERY     RIGHT CATARACT EXTRACTON WITH LENS IMPLANT  . GROWTH REMOVED FROM THYROID    . INTRAMEDULLARY (IM) NAIL INTERTROCHANTERIC Left 02/10/2018   Procedure: INTRAMEDULLARY (IM) NAIL INTERTROCHANTRIC;  Surgeon: Kathryne HitchBlackman, Mazel Villela Y, MD;  Location: [facility] OR;  Service: Orthopedics;  Laterality: Left;  . PERCUTANEOUS NEPHROLITHOTOMY    . TOTAL KNEE ARTHROPLASTY  06/09/2012   Procedure: TOTAL KNEE ARTHROPLASTY;  Surgeon: Kathryne Hitchhristopher Y Blackman, MD;  Location: [facility];  Service: Orthopedics;  Laterality: Right;  Right Total Knee Arthroplasty  . TOTAL KNEE ARTHROPLASTY Left 04/09/2014   Procedure: LEFT TOTAL KNEE ARTHROPLASTY;  Surgeon: Kathryne Hitchhristopher Y Blackman, MD;  [facility];  Service: Orthopedics;  Laterality: Left;  . TUBAL LIGATION      There were no vitals filed for this visit.  Subjective Assessment - 05/18/18 0857    Subjective  Did ok after last Rx. Lt hip 4/10 today    Pertinent History  DM; OP.    How long can you walk comfortably?  Short distances with  walker.    Currently in Pain?  Yes    Pain Score  4     Pain Orientation  Left    Pain Descriptors / Indicators  Aching    Pain Type  Acute pain    Pain Onset  More than a month ago    Pain Frequency  Constant                       [facility] Adult PT Treatment/Exercise - 05/18/18 0001      Ambulation/Gait   Ambulation Distance (Feet)  300 Feet    Assistive device  Straight cane    Gait Pattern  Decreased arm swing - right;Decreased arm swing - left;Decreased step length - right;Decreased step length - left;Decreased stance time - left    Gait Comments  Minimal Trendelenburg gait pattern.  Patine using a SPC      Exercises   Exercises  Knee/Hip      Knee/Hip Exercises: Aerobic   Nustep  Level 4 x 15 minutes monitored and moving forward x 2 to gently increase hip flexion.      Knee/Hip Exercises: Standing   Hip Abduction  AROM;Left;Both;10 reps;3 sets    Rocker Board  5 minutes    Other Standing Knee Exercises  stepping strategy to the the LT side 3x10 in // bars with SBA only      Modalities   Modalities  Electrical Stimulation;Moist Heat      Moist Heat Therapy   Number Minutes Moist Heat  15 Minutes    Moist Heat Location  Hip      Electrical Stimulation   Electrical Stimulation Location  LT hip  IFC x 15 mins 80-150hz  x 15 mins    Electrical Stimulation Goals  Tone;Pain                  PT Long Term Goals - 05/18/18 0909      PT LONG TERM GOAL #1   Title  Ind with an HEP.    Time  8    Period  Weeks    Status  On-going      PT LONG TERM GOAL #2   Title  5/5 left hip abduction strength to increase stability for functional tasks.    Time  8    Period  Weeks    Status  On-going      PT LONG TERM GOAL #3   Title  Walk in clinic 500 feet with straight cane.    Time  8    Period  Weeks    Status  On-going      PT LONG TERM GOAL #4   Title  Perform a reciprocating stair gait with one railing.    Period  Weeks    Status  On-going             Plan - 05/18/18 1000    Clinical Impression Statement  Pt arrived today doing fairly well and had no problems after last Rx. She was able to perform all therex today without inreased LT hip pain. Gait with SPC was advanced to 356ft  today with CGA/SBA with no LOB today. Stepping strategy to the LT  was also practiced today in // bars with SBA. LTGs are ongoing at this time due to weakness and balance deficits.    Clinical Presentation  Stable    Clinical Decision Making  Low    Rehab Potential  Excellent    PT Frequency  2x / week    PT Duration  8 weeks    PT Treatment/Interventions  ADLs/Self Care Home Management;Cryotherapy;Electrical Stimulation;Ultrasound;Functional mobility training;Therapeutic activities;Therapeutic exercise;Neuromuscular re-education;Patient/family education;Manual techniques    PT Next Visit Plan  Left hip strengthening; gait training; Nustep/staionary bike.  Modalites and STW/MPRN.    Consulted and Agree with Plan of Care  Patient       Patient will benefit from skilled therapeutic intervention in order to improve the following deficits and impairments:  Abnormal gait, Decreased activity tolerance, Decreased range of motion, Decreased strength, Pain  Visit Diagnosis: Pain in left hip  Muscle weakness (generalized)     Problem List Patient Active Problem List   Diagnosis Date Noted  . Closed left hip fracture (HCC) 02/10/2018  . CKD (chronic kidney disease) stage 3, GFR 30-59 ml/min (HCC) 02/10/2018  . Controlled type 2 diabetes mellitus with hyperglycemia (HCC) 02/10/2018  . Hip fracture (HCC) 02/10/2018  . Pressure injury of skin 02/10/2018  . Chest pain 02/19/2016  . Arthritis of left knee 04/09/2014  . Status post total knee replacement 04/09/2014  . Arthropathy   . Diabetes (HCC)   . Hypertension   . Kidney stones   . Hypothyroidism   . Degenerative arthritis of knee 06/09/2012  . Flatulence, eructation, and  gas pain 12/24/2005   . Abdominal or pelvic swelling, mass, or lump, other specified site 12/24/2005  . Osteoporosis 12/04/2004  . Symptomatic menopausal or female climacteric states 12/04/2004  . Arthropathy, lower leg 04/21/2004  . Hematuria 09/17/2003  . Other long term (current) drug therapy 07/05/2002  . Hyperlipidemia, mixed 03/01/2000    Amonda Brillhart,CHRIS, PTA 05/18/2018, 10:09 AM  [facility] [address] [place], [place], [address] Phone: [phone]   Fax:  [phone]  [name] MRN: [id number] Date of Birth: 03/11/1946

## 2018-05-23 ENCOUNTER — Encounter: Payer: Self-pay | Admitting: Physical Therapy

## 2018-05-23 ENCOUNTER — Ambulatory Visit: Payer: Medicare Other | Admitting: Physical Therapy

## 2018-05-23 DIAGNOSIS — M25552 Pain in left hip: Secondary | ICD-10-CM

## 2018-05-23 DIAGNOSIS — M6281 Muscle weakness (generalized): Secondary | ICD-10-CM

## 2018-05-23 NOTE — Therapy (Signed)
[facility] [address] [place], [place], [address] Phone: [phone]   Fax:  [phone]  Physical Therapy Treatment  Patient Details  [name] MRN: [id number] Date of Birth: 18-Jan-1946 Referring Provider: Rex Kras MD.   Encounter Date: 05/23/2018  PT End of Session - 05/23/18 1122    Visit Number  5    Number of Visits  16    Date for PT Re-Evaluation  08/06/18    PT Start Time  1030    PT Stop Time  1120    PT Time Calculation (min)  50 min    Activity Tolerance  Patient tolerated treatment well    Behavior During Therapy  [facility] for tasks assessed/performed       Past Medical History:  Diagnosis Date  . Arthritis    OA AND PAIN BOTH KNEES -RIGHT KNEE HURTS WORSE THAN LEFT  . Diabetes mellitus    ORAL MEDICATION - NO INSULIN  . Hypertension   . Hypothyroidism   . Kidney stones    NONE AT PRESENT TIME THAT PT IS AWARE OF    Past Surgical History:  Procedure Laterality Date  . ABDOMINAL HYSTERECTOMY    . CHOLECYSTECTOMY  1972  . COLONOSCOPY    . EYE SURGERY     RIGHT CATARACT EXTRACTON WITH LENS IMPLANT  . GROWTH REMOVED FROM THYROID    . INTRAMEDULLARY (IM) NAIL INTERTROCHANTERIC Left 02/10/2018   Procedure: INTRAMEDULLARY (IM) NAIL INTERTROCHANTRIC;  Surgeon: Kathryne Hitch, MD;  Location: [facility] OR;  Service: Orthopedics;  Laterality: Left;  . PERCUTANEOUS NEPHROLITHOTOMY    . TOTAL KNEE ARTHROPLASTY  06/09/2012   Procedure: TOTAL KNEE ARTHROPLASTY;  Surgeon: Kathryne Hitch, MD;  Location: [facility];  Service: Orthopedics;  Laterality: Right;  Right Total Knee Arthroplasty  . TOTAL KNEE ARTHROPLASTY Left 04/09/2014   Procedure: LEFT TOTAL KNEE ARTHROPLASTY;  Surgeon: Kathryne Hitch, MD;  Location: [facility] OR;  Service: Orthopedics;  Laterality: Left;  . TUBAL LIGATION      There were no vitals filed for this visit.  Subjective Assessment - 05/23/18 1044    Subjective  Pt arriving to  therapy reporting she has been workin at home with walking with her straight cane. Pt reporting 4/10 pain.     Pertinent History  DM; OP.    How long can you walk comfortably?  Short distances with walker.    Patient Stated Goals  See above.    Currently in Pain?  Yes    Pain Score  4     Pain Orientation  Left    Pain Descriptors / Indicators  Aching    Pain Type  Acute pain    Pain Onset  More than a month ago                       [facility] Adult PT Treatment/Exercise - 05/23/18 0001      Ambulation/Gait   Ambulation Distance (Feet)  400 Feet    Assistive device  Straight cane    Gait Pattern  Decreased arm swing - right;Decreased arm swing - left;Decreased step length - right;Decreased step length - left;Decreased stance time - left    Gait Comments  Minimal Trendelenburg gait pattern.  Pt using a SPC      Exercises   Exercises  Knee/Hip      Knee/Hip Exercises: Aerobic   Nustep  Level 4 x 10 minutes monitored and moving forward x 2  to gently increase hip flexion.      Knee/Hip Exercises: Standing   Hip Abduction  AROM;Left;Both;10 reps;3 sets    Forward Step Up  10 reps    Functional Squat  2 sets;5 reps    Functional Squat Limitations  using UE support    Rocker Board  2 minutes    Other Standing Knee Exercises  standing alteranate tapping on balance pods x 30 reps with CGA      Knee/Hip Exercises: Seated   Sit to Sand  10 reps;with UE support      Modalities   Modalities  Electrical Stimulation      Moist Heat Therapy   Number Minutes Moist Heat  15 Minutes    Moist Heat Location  Hip      Electrical Stimulation   Electrical Stimulation Location  Left hip    Electrical Stimulation Action  IFC 80-150 Hz x 15 minutes, intensity to tolerance    Electrical Stimulation Goals  Tone;Pain             PT Education - 05/23/18 1101    Education Details  Gait training with straight cane    Person(s) Educated  Patient    Methods   Explanation;Demonstration    Comprehension  Verbalized understanding       PT Short Term Goals - 05/08/18 1151      PT SHORT TERM GOAL #1   Title  STG's=LTG's.        PT Long Term Goals - 05/23/18 1225      PT LONG TERM GOAL #1   Title  Ind with an HEP.    Time  8    Period  Weeks    Status  On-going      PT LONG TERM GOAL #2   Title  5/5 left hip abduction strength to increase stability for functional tasks.    Time  8    Period  Weeks    Status  On-going      PT LONG TERM GOAL #3   Title  Walk in clinic 500 feet with straight cane.    Time  8    Period  Weeks    Status  On-going      PT LONG TERM GOAL #4   Title  Perform a reciprocating stair gait with one railing.    Time  8    Period  Weeks    Status  On-going            Plan - 05/23/18 1125    Clinical Impression Statement  Pt tolerating all exercises well with no increased pain reported. Pt progressing toward increased dyanmic balance. Pt working on side stepping, hip abd and sit to stand techniques to build strength in bilateral LE's. Pt tolerating e-stim and heat at end of session and reporting less pain in her left hip of 2-3/10. Continue skilled PT.     Rehab Potential  Excellent    PT Frequency  2x / week    PT Duration  8 weeks    PT Treatment/Interventions  ADLs/Self Care Home Management;Cryotherapy;Electrical Stimulation;Ultrasound;Functional mobility training;Therapeutic activities;Therapeutic exercise;Neuromuscular re-education;Patient/family education;Manual techniques    PT Next Visit Plan  Left hip strengthening; gait training; Nustep/staionary bike.  Modalites and STW/MPRN.    PT Home Exercise Plan  standing hip abd, gait with a straight cane short distances, hip flexion ( all standing exercises to be done with UE support)    Consulted and Agree with Plan of Care  Patient       Patient will benefit from skilled therapeutic intervention in order to improve the following deficits and  impairments:  Abnormal gait, Decreased activity tolerance, Decreased range of motion, Decreased strength, Pain  Visit Diagnosis: Pain in left hip  Muscle weakness (generalized)     Problem List Patient Active Problem List   Diagnosis Date Noted  . Closed left hip fracture (HCC) 02/10/2018  . CKD (chronic kidney disease) stage 3, GFR 30-59 ml/min (HCC) 02/10/2018  . Controlled type 2 diabetes mellitus with hyperglycemia (HCC) 02/10/2018  . Hip fracture (HCC) 02/10/2018  . Pressure injury of skin 02/10/2018  . Chest pain 02/19/2016  . Arthritis of left knee 04/09/2014  . Status post total knee replacement 04/09/2014  . Arthropathy   . Diabetes (HCC)   . Hypertension   . Kidney stones   . Hypothyroidism   . Degenerative arthritis of knee 06/09/2012  . Flatulence, eructation, and gas pain 12/24/2005  . Abdominal or pelvic swelling, mass, or lump, other specified site 12/24/2005  . Osteoporosis 12/04/2004  . Symptomatic menopausal or female climacteric states 12/04/2004  . Arthropathy, lower leg 04/21/2004  . Hematuria 09/17/2003  . Other long term (current) drug therapy 07/05/2002  . Hyperlipidemia, mixed 03/01/2000    Sharmon LeydenJennifer R Dahmir Epperly, MPT 05/23/2018, 12:29 PM  [facility] [address] [place], [place], [address] Phone: [phone]   Fax:  [phone]  Name: Jan FiremanMarie S Pflum MRN: [id number] Date of Birth: 1945/11/06

## 2018-05-25 ENCOUNTER — Ambulatory Visit (INDEPENDENT_AMBULATORY_CARE_PROVIDER_SITE_OTHER): Payer: Medicare Other

## 2018-05-25 ENCOUNTER — Ambulatory Visit (INDEPENDENT_AMBULATORY_CARE_PROVIDER_SITE_OTHER): Payer: Medicare Other | Admitting: Physician Assistant

## 2018-05-25 ENCOUNTER — Ambulatory Visit (INDEPENDENT_AMBULATORY_CARE_PROVIDER_SITE_OTHER): Payer: Self-pay

## 2018-05-25 ENCOUNTER — Encounter (INDEPENDENT_AMBULATORY_CARE_PROVIDER_SITE_OTHER): Payer: Self-pay | Admitting: Physician Assistant

## 2018-05-25 ENCOUNTER — Encounter: Payer: Self-pay | Admitting: Physical Therapy

## 2018-05-25 ENCOUNTER — Ambulatory Visit: Payer: Medicare Other | Admitting: Physical Therapy

## 2018-05-25 DIAGNOSIS — M25552 Pain in left hip: Secondary | ICD-10-CM | POA: Diagnosis not present

## 2018-05-25 DIAGNOSIS — G8918 Other acute postprocedural pain: Secondary | ICD-10-CM

## 2018-05-25 DIAGNOSIS — M6281 Muscle weakness (generalized): Secondary | ICD-10-CM

## 2018-05-25 DIAGNOSIS — S72142G Displaced intertrochanteric fracture of left femur, subsequent encounter for closed fracture with delayed healing: Secondary | ICD-10-CM

## 2018-05-25 MED ORDER — HYDROCODONE-ACETAMINOPHEN 5-325 MG PO TABS: 1.0000 | ORAL_TABLET | Freq: Four times a day (QID) | ORAL | 0 refills | Status: DC | PRN

## 2018-05-25 NOTE — Progress Notes (Signed)
HPI: [name] returns today now 104 days status post IM nailing of the left hip fracture.  She has been in a skilled facility.  Staples were removed there.  This first time she been seen since discharge from the hospital.  She is working with physical therapy and [facility]  She notes she walks with a walker outside the home and without it inside the home.  She has had no shortness of breath chest pain fevers or chills.  She does have calf pain at night and swelling of her left calf.  She is on 81 mg aspirin daily.  Physical exam: Left calf significant pitting edema and tenderness in the proximal calf.  Surgical incisions are healing well and no signs of infection.  She has fluid maintenance range of motion of the hip without significant pain.  Able to dorsiflex plantarflex the ankle.  Impression: Status post IM nailing left hip intertrochanteric fracture Left calf pain  Plan: At this point time we will send her for a Doppler to rule out DVT.  She will continue to work with physical therapy for range of motion strengthening of the left lower extremity.  Questions encouraged and answered at length.  She understands she will need to go on long-term anticoagulation if she does not fact have a DVT.  Elevation leg encouraged.  Follow-up with us in 1 month for AP left hip and lateral hip.

## 2018-05-25 NOTE — Therapy (Signed)
[facility] [address] [place], [place], [address] Phone: [phone]   Fax:  [phone]  Physical Therapy Treatment  Patient Details  Name: Meredith FiremanMarie S Marulanda MRN: [id number] Date of Birth: 1945/12/23 Referring Provider: Rex Krasarol Vincent MD.   Encounter Date: 05/25/2018  PT End of Session - 05/25/18 1243    Visit Number  6    Number of Visits  16    Date for PT Re-Evaluation  08/06/18    PT Start Time  1030    PT Stop Time  1115    PT Time Calculation (min)  45 min    Activity Tolerance  Patient tolerated treatment well    Behavior During Therapy  [facility] for tasks assessed/performed       Past Medical History:  Diagnosis Date  . Arthritis    OA AND PAIN BOTH KNEES -RIGHT KNEE HURTS WORSE THAN LEFT  . Diabetes mellitus    ORAL MEDICATION - NO INSULIN  . Hypertension   . Hypothyroidism   . Kidney stones    NONE AT PRESENT TIME THAT PT IS AWARE OF    Past Surgical History:  Procedure Laterality Date  . ABDOMINAL HYSTERECTOMY    . CHOLECYSTECTOMY  1972  . COLONOSCOPY    . EYE SURGERY     RIGHT CATARACT EXTRACTON WITH LENS IMPLANT  . GROWTH REMOVED FROM THYROID    . INTRAMEDULLARY (IM) NAIL INTERTROCHANTERIC Left 02/10/2018   Procedure: INTRAMEDULLARY (IM) NAIL INTERTROCHANTRIC;  Surgeon: Kathryne HitchBlackman, Christopher Y, MD;  Location: [facility] OR;  Service: Orthopedics;  Laterality: Left;  . PERCUTANEOUS NEPHROLITHOTOMY    . TOTAL KNEE ARTHROPLASTY  06/09/2012   Procedure: TOTAL KNEE ARTHROPLASTY;  Surgeon: Kathryne Hitchhristopher Y Blackman, MD;  Location: [facility] ORS;  Service: Orthopedics;  Laterality: Right;  Right Total Knee Arthroplasty  . TOTAL KNEE ARTHROPLASTY Left 04/09/2014   Procedure: LEFT TOTAL KNEE ARTHROPLASTY;  Surgeon: Kathryne Hitchhristopher Y Blackman, MD;  [facility];  Service: Orthopedics;  Laterality: Left;  . TUBAL LIGATION      There were no vitals filed for this visit.  Subjective Assessment - 05/25/18 1241    Subjective  Pt arriving to  therapy reporting no pain. Pt amb with a rolling walker and reported walking small distances at home with a straight cane.     Pertinent History  DM; OP.    How long can you walk comfortably?  Short distances with walker.    Patient Stated Goals  See above.    Currently in Pain?  No/denies         [facility] - 05/25/18 0001      Ambulation/Gait   Gait Pattern  Decreased arm swing - right;Decreased arm swing - left;Decreased step length - right;Decreased step length - left;Decreased stance time - left                   [facility] Adult PT Treatment/Exercise - 05/25/18 0001      Ambulation/Gait   Ambulation Distance (Feet)  600 Feet    Assistive device  Straight cane   CGA with gait belt   Ambulation Surface  Unlevel;Outdoor    Gait Comments  Minimal Trendelenburg gait pattern.  Pt using a SPC      Exercises   Exercises  Knee/Hip      Knee/Hip Exercises: Aerobic   Nustep  Level 4 x 13 minutes monitored and moving forward x 2 to gently increase hip flexion.      Knee/Hip Exercises: Standing  Hip Abduction  AROM;Left;Both;10 reps;3 sets    Forward Step Up  10 reps    Functional Squat  2 sets;5 reps    Functional Squat Limitations  using UE support    Other Standing Knee Exercises  standing alteranate tapping on balance pods (3 pods)  x 30 reps with CGA    Other Standing Knee Exercises  braiding with bilateral UE support and gait belt x 20 feet 4 reps      Knee/Hip Exercises: Seated   Sit to Sand  15 reps;with UE support             PT Education - 05/25/18 1242    Education Details  Gait training with a straight cane working on increased step length with step through gait pattern.     Person(s) Educated  Patient    Methods  Explanation;Demonstration;Verbal cues    Comprehension  Returned demonstration;Need further instruction;Verbalized understanding       PT Short Term Goals - 05/08/18 1151      PT SHORT TERM GOAL #1   Title  STG's=LTG's.         PT Long Term Goals - 05/25/18 1245      PT LONG TERM GOAL #1   Title  Ind with an HEP.    Time  8    Period  Weeks    Status  On-going      PT LONG TERM GOAL #2   Title  5/5 left hip abduction strength to increase stability for functional tasks.    Time  8    Period  Weeks    Status  On-going      PT LONG TERM GOAL #3   Title  Walk in clinic 500 feet with straight cane.    Time  8    Period  Weeks    Status  On-going      PT LONG TERM GOAL #4   Title  Perform a reciprocating stair gait with one railing.    Time  8    Period  Weeks    Status  On-going            Plan - 05/25/18 1243    Clinical Impression Statement  Pt tolerating all exercises well, working on standing dynamic balance and gait training more today. Pt required CGA via gait belt when amb on uneven surfaces (sidewalks and paved driveway). Pt also working on stepping on and off curb step with min assist. Continue to progress skilled PT to progress toward PLOF.     Rehab Potential  Excellent    PT Frequency  2x / week    PT Duration  8 weeks    PT Treatment/Interventions  ADLs/Self Care Home Management;Cryotherapy;Electrical Stimulation;Ultrasound;Functional mobility training;Therapeutic activities;Therapeutic exercise;Neuromuscular re-education;Patient/family education;Manual techniques    PT Next Visit Plan  Left hip strengthening; gait training; Nustep/staionary bike.  Modalites and STW/MPRN.    PT Home Exercise Plan  standing hip abd, gait with a straight cane short distances, hip flexion ( all standing exercises to be done with UE support)    Consulted and Agree with Plan of Care  Patient       Patient will benefit from skilled therapeutic intervention in order to improve the following deficits and impairments:  Abnormal gait, Decreased activity tolerance, Decreased range of motion, Decreased strength, Pain  Visit Diagnosis: Pain in left hip  Muscle weakness (generalized)     Problem  List Patient Active Problem List   Diagnosis Date  Noted  . Closed left hip fracture (HCC) 02/10/2018  . CKD (chronic kidney disease) stage 3, GFR 30-59 ml/min (HCC) 02/10/2018  . Controlled type 2 diabetes mellitus with hyperglycemia (HCC) 02/10/2018  . Hip fracture (HCC) 02/10/2018  . Pressure injury of skin 02/10/2018  . Chest pain 02/19/2016  . Arthritis of left knee 04/09/2014  . Status post total knee replacement 04/09/2014  . Arthropathy   . Diabetes (HCC)   . Hypertension   . Kidney stones   . Hypothyroidism   . Degenerative arthritis of knee 06/09/2012  . Flatulence, eructation, and gas pain 12/24/2005  . Abdominal or pelvic swelling, mass, or lump, other specified site 12/24/2005  . Osteoporosis 12/04/2004  . Symptomatic menopausal or female climacteric states 12/04/2004  . Arthropathy, lower leg 04/21/2004  . Hematuria 09/17/2003  . Other long term (current) drug therapy 07/05/2002  . Hyperlipidemia, mixed 03/01/2000    Sharmon Leyden, MPT 05/25/2018, 12:48 PM  [facility] [address] [place], [place], [address] Phone: [phone]   Fax:  [phone]  Name: KRYSTAL TEACHEY MRN: [id number] Date of Birth: 1946/04/04

## 2018-05-26 ENCOUNTER — Ambulatory Visit (HOSPITAL_COMMUNITY)
Admission: RE | Admit: 2018-05-26 | Discharge: 2018-05-26 | Disposition: A | Discharge status: Home / Self Care | Payer: Medicare Other | Source: Ambulatory Visit | Attending: Pediatrics | Admitting: Pediatrics

## 2018-05-26 ENCOUNTER — Telehealth (INDEPENDENT_AMBULATORY_CARE_PROVIDER_SITE_OTHER): Payer: Self-pay

## 2018-05-26 DIAGNOSIS — R600 Localized edema: Secondary | ICD-10-CM | POA: Insufficient documentation

## 2018-05-26 DIAGNOSIS — G8918 Other acute postprocedural pain: Secondary | ICD-10-CM | POA: Insufficient documentation

## 2018-05-26 DIAGNOSIS — M79605 Pain in left leg: Secondary | ICD-10-CM | POA: Insufficient documentation

## 2018-05-26 NOTE — Telephone Encounter (Signed)
FYI  DO we need to do anything?

## 2018-05-26 NOTE — Progress Notes (Signed)
Preliminary notes-- Left lower extremity venous duplex exam completed. Negative for DVT.  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  Severe edema seen at the calf region.  Result called ordering physician's office spoke with Sherrie.  Meredith Shaw (RDMS RVT) 05/26/18 2:05 PM

## 2018-05-26 NOTE — Telephone Encounter (Signed)
Nothing is needed from our standpoint.

## 2018-05-26 NOTE — Telephone Encounter (Signed)
[facility]  Doppler negative for DVT.  Saphenous vein is thrombosed.  Patient was sent home after procedure.

## 2018-05-31 ENCOUNTER — Encounter: Payer: Self-pay | Admitting: Physical Therapy

## 2018-05-31 ENCOUNTER — Ambulatory Visit: Payer: Medicare Other | Admitting: Physical Therapy

## 2018-05-31 DIAGNOSIS — M6281 Muscle weakness (generalized): Secondary | ICD-10-CM | POA: Diagnosis not present

## 2018-05-31 DIAGNOSIS — M25552 Pain in left hip: Secondary | ICD-10-CM | POA: Diagnosis not present

## 2018-05-31 NOTE — Therapy (Signed)
[facility] [address] [place], [place], [address] Phone: [phone]   Fax:  [phone]  Physical Therapy Treatment  Patient Details  Name: CALIANNE LARUE MRN: [id number] Date of Birth: [birth year] Referring Provider: Rex Kras MD.   Encounter Date: 05/31/2018  PT End of Session - 05/31/18 0902    Visit Number  7    Number of Visits  16    Date for PT Re-Evaluation  08/06/18    PT Start Time  0901    PT Stop Time  0947    PT Time Calculation (min)  46 min    Activity Tolerance  Patient tolerated treatment well    Behavior During Therapy  [facility] for tasks assessed/performed       Past Medical History:  Diagnosis Date  . Arthritis    OA AND PAIN BOTH KNEES -RIGHT KNEE HURTS WORSE THAN LEFT  . Diabetes mellitus    ORAL MEDICATION - NO INSULIN  . Hypertension   . Hypothyroidism   . Kidney stones    NONE AT PRESENT TIME THAT PT IS AWARE OF    Past Surgical History:  Procedure Laterality Date  . ABDOMINAL HYSTERECTOMY    . CHOLECYSTECTOMY  1972  . COLONOSCOPY    . EYE SURGERY     RIGHT CATARACT EXTRACTON WITH LENS IMPLANT  . GROWTH REMOVED FROM THYROID    . INTRAMEDULLARY (IM) NAIL INTERTROCHANTERIC Left 02/10/2018   Procedure: INTRAMEDULLARY (IM) NAIL INTERTROCHANTRIC;  Surgeon: Kathryne Hitch, MD;  Location: [facility] OR;  Service: Orthopedics;  Laterality: Left;  . PERCUTANEOUS NEPHROLITHOTOMY    . TOTAL KNEE ARTHROPLASTY  06/09/2012   Procedure: TOTAL KNEE ARTHROPLASTY;  Surgeon: Kathryne Hitch, MD;  Location: [facility];  Service: Orthopedics;  Laterality: Right;  Right Total Knee Arthroplasty  . TOTAL KNEE ARTHROPLASTY Left 04/09/2014   Procedure: LEFT TOTAL KNEE ARTHROPLASTY;  Surgeon: Kathryne Hitch, MD;  Location: [facility] OR;  Service: Orthopedics;  Laterality: Left;  . TUBAL LIGATION      There were no vitals filed for this visit.  Subjective Assessment - 05/31/18 0900    Subjective  Reports having  some pain today.    Pertinent History  DM; OP.    How long can you walk comfortably?  Short distances with walker.    Patient Stated Goals  See above.    Currently in Pain?  Yes    Pain Score  4     Pain Location  Hip    Pain Orientation  Left    Pain Descriptors / Indicators  Throbbing    Pain Type  Acute pain    Pain Onset  More than a month ago         [facility] PT Assessment - 05/31/18 0001      Assessment   Medical Diagnosis  Left hip fracture; Balance disorder.    Onset Date/Surgical Date  02/10/18    Next MD Visit  06/2018      Precautions   Precautions  Fall      Restrictions   Weight Bearing Restrictions  No                   [facility] Adult PT Treatment/Exercise - 05/31/18 0001      Exercises   Exercises  Knee/Hip      Knee/Hip Exercises: Stretches   ITB Stretch  Left;3 reps;30 seconds    Piriformis Stretch  Left;3 reps;30 seconds      Knee/Hip  Exercises: Aerobic   Nustep  L5 x17 min      Knee/Hip Exercises: Standing   Hip Flexion  AROM;Both;2 sets;10 reps;Knee bent    Hip Abduction  AROM;Both;2 sets;10 reps;Knee straight    Hip Extension  AROM;Both;2 sets;10 reps;Knee straight    Forward Step Up  Both;2 sets;10 reps;Hand Hold: 2;Step Height: 6"    Functional Squat  2 sets;20 reps    Functional Squat Limitations  using UE support    SLS  BLE SLS for 5 balance pods touches x5 reps each      Knee/Hip Exercises: Seated   Long Arc Quad  Strengthening;Left;2 sets;10 reps;Weights    Long Arc Quad Weight  3 lbs.    Clamshell with Carman ChingheraBand  Green   x20 reps              PT Short Term Goals - 05/08/18 1151      PT SHORT TERM GOAL #1   Title  STG's=LTG's.        PT Long Term Goals - 05/25/18 1245      PT LONG TERM GOAL #1   Title  Ind with an HEP.    Time  8    Period  Weeks    Status  On-going      PT LONG TERM GOAL #2   Title  5/5 left hip abduction strength to increase stability for functional tasks.    Time  8    Period   Weeks    Status  On-going      PT LONG TERM GOAL #3   Title  Walk in clinic 500 feet with straight cane.    Time  8    Period  Weeks    Status  On-going      PT LONG TERM GOAL #4   Title  Perform a reciprocating stair gait with one railing.    Time  8    Period  Weeks    Status  On-going            Plan - 05/31/18 0955    Clinical Impression Statement  Patient tolerated today's treatment fairly well as she was progressed with more standing exercises. Patient required VCs and demo particularly for squat technique. Glute med weakness in LLE noted today in sitting as well as standing exercises. L hip IR noted with L forward step ups. Tightness of L hip noted with ITB and piriformis stretching.     Rehab Potential  Excellent    PT Frequency  2x / week    PT Duration  8 weeks    PT Treatment/Interventions  ADLs/Self Care Home Management;Cryotherapy;Electrical Stimulation;Ultrasound;Functional mobility training;Therapeutic activities;Therapeutic exercise;Neuromuscular re-education;Patient/family education;Manual techniques    PT Next Visit Plan  Left hip strengthening; gait training; Nustep/staionary bike.  Modalites and STW/MPRN.    PT Home Exercise Plan  standing hip abd, gait with a straight cane short distances, hip flexion ( all standing exercises to be done with UE support)    Consulted and Agree with Plan of Care  Patient       Patient will benefit from skilled therapeutic intervention in order to improve the following deficits and impairments:  Abnormal gait, Decreased activity tolerance, Decreased range of motion, Decreased strength, Pain  Visit Diagnosis: Pain in left hip  Muscle weakness (generalized)     Problem List Patient Active Problem List   Diagnosis Date Noted  . Closed left hip fracture (HCC) 02/10/2018  . CKD (chronic kidney disease) stage  3, GFR 30-59 ml/min (HCC) 02/10/2018  . Controlled type 2 diabetes mellitus with hyperglycemia (HCC) 02/10/2018  .  Hip fracture (HCC) 02/10/2018  . Pressure injury of skin 02/10/2018  . Chest pain 02/19/2016  . Arthritis of left knee 04/09/2014  . Status post total knee replacement 04/09/2014  . Arthropathy   . Diabetes (HCC)   . Hypertension   . Kidney stones   . Hypothyroidism   . Degenerative arthritis of knee 06/09/2012  . Flatulence, eructation, and gas pain 12/24/2005  . Abdominal or pelvic swelling, mass, or lump, other specified site 12/24/2005  . Osteoporosis 12/04/2004  . Symptomatic menopausal or female climacteric states 12/04/2004  . Arthropathy, lower leg 04/21/2004  . Hematuria 09/17/2003  . Other long term (current) drug therapy 07/05/2002  . Hyperlipidemia, mixed 03/01/2000    Marvell FullerKelsey P Kennon, PTA 05/31/2018, 9:59 AM  [facility] [address] [place], [place], [address] Phone: [phone]   Fax:  [phone]  Name: Jan FiremanMarie S Martos MRN: [id number] Date of Birth: 1946-02-01

## 2018-06-01 ENCOUNTER — Encounter: Payer: Self-pay | Admitting: Physical Therapy

## 2018-06-01 ENCOUNTER — Ambulatory Visit: Payer: Medicare Other | Admitting: Physical Therapy

## 2018-06-01 DIAGNOSIS — M6281 Muscle weakness (generalized): Secondary | ICD-10-CM | POA: Diagnosis not present

## 2018-06-01 DIAGNOSIS — M25552 Pain in left hip: Secondary | ICD-10-CM

## 2018-06-01 NOTE — Therapy (Signed)
[facility] [address] [place], [place], [address] Phone: [phone]   Fax:  [phone]  Physical Therapy Treatment  Patient Details  [name] MRN: [id number] Date of Birth: 1946/09/18 Referring Provider: Rex Kras MD.   Encounter Date: 06/01/2018  PT End of Session - 06/01/18 0918    Visit Number  8    Number of Visits  16    Date for PT Re-Evaluation  08/06/18    PT Start Time  0900    PT Stop Time  0950    PT Time Calculation (min)  50 min    Activity Tolerance  Patient tolerated treatment well    Behavior During Therapy  [facility] for tasks assessed/performed       Past Medical History:  Diagnosis Date  . Arthritis    OA AND PAIN BOTH KNEES -RIGHT KNEE HURTS WORSE THAN LEFT  . Diabetes mellitus    ORAL MEDICATION - NO INSULIN  . Hypertension   . Hypothyroidism   . Kidney stones    NONE AT PRESENT TIME THAT PT IS AWARE OF    Past Surgical History:  Procedure Laterality Date  . ABDOMINAL HYSTERECTOMY    . CHOLECYSTECTOMY  1972  . COLONOSCOPY    . EYE SURGERY     RIGHT CATARACT EXTRACTON WITH LENS IMPLANT  . GROWTH REMOVED FROM THYROID    . INTRAMEDULLARY (IM) NAIL INTERTROCHANTERIC Left 02/10/2018   Procedure: INTRAMEDULLARY (IM) NAIL INTERTROCHANTRIC;  Surgeon: Kathryne Hitch, MD;  Location: [facility] OR;  Service: Orthopedics;  Laterality: Left;  . PERCUTANEOUS NEPHROLITHOTOMY    . TOTAL KNEE ARTHROPLASTY  06/09/2012   Procedure: TOTAL KNEE ARTHROPLASTY;  Surgeon: Kathryne Hitch, MD;  Location: [facility];  Service: Orthopedics;  Laterality: Right;  Right Total Knee Arthroplasty  . TOTAL KNEE ARTHROPLASTY Left 04/09/2014   Procedure: LEFT TOTAL KNEE ARTHROPLASTY;  Surgeon: Kathryne Hitch, MD;  Location: [facility] OR;  Service: Orthopedics;  Laterality: Left;  . TUBAL LIGATION      There were no vitals filed for this visit.  Subjective Assessment - 06/01/18 1636    Subjective  Patient reports  feeling "alright."     Pertinent History  DM; OP.    How long can you walk comfortably?  Short distances with walker.    Patient Stated Goals  See above.    Currently in Pain?  Yes    Pain Score  4     Pain Location  Hip    Pain Orientation  Left    Pain Descriptors / Indicators  Tender    Pain Type  Acute pain;Surgical pain    Pain Onset  More than a month ago    Pain Frequency  Constant         [facility] PT Assessment - 06/01/18 0001      Assessment   Medical Diagnosis  Left hip fracture; Balance disorder.    Onset Date/Surgical Date  02/10/18    Next MD Visit  06/2018      Precautions   Precautions  Fall      Restrictions   Weight Bearing Restrictions  No                   [facility] Adult PT Treatment/Exercise - 06/01/18 0001      Exercises   Exercises  Knee/Hip      Knee/Hip Exercises: Aerobic   Nustep  L5 x15 min      Knee/Hip Exercises: Standing  Hip Flexion  AROM;Both;2 sets;10 reps;Knee bent    Hip Abduction  AROM;Both;2 sets;10 reps;Knee straight    Other Standing Knee Exercises  lateral stepping at countertop x2 minutes      Knee/Hip Exercises: Seated   Clamshell with TheraBand  Green   20   Sit to Sand  20 reps;with UE support      Moist Heat Therapy   Number Minutes Moist Heat  15 Minutes    Moist Heat Location  Hip      Electrical Stimulation   Electrical Stimulation Location  Left hip    Electrical Stimulation Action  IFC    Electrical Stimulation Parameters  80-150 hz x15 mins    Electrical Stimulation Goals  Pain               PT Short Term Goals - 05/08/18 1151      PT SHORT TERM GOAL #1   Title  STG's=LTG's.        PT Long Term Goals - 05/25/18 1245      PT LONG TERM GOAL #1   Title  Ind with an HEP.    Time  8    Period  Weeks    Status  On-going      PT LONG TERM GOAL #2   Title  5/5 left hip abduction strength to increase stability for functional tasks.    Time  8    Period  Weeks    Status  On-going       PT LONG TERM GOAL #3   Title  Walk in clinic 500 feet with straight cane.    Time  8    Period  Weeks    Status  On-going      PT LONG TERM GOAL #4   Title  Perform a reciprocating stair gait with one railing.    Time  8    Period  Weeks    Status  On-going            Plan - 06/01/18 0944    Clinical Impression Statement  Patient was able to tolerate treatment well with standing exercises but required multiple cuing for proper form and technique. Patient noted with some hip tenderness after seated clam shells but reported it was tolerable. Patient educated to practice walking within her home without an AD when husband is home to gain more comfort without walker. Patient reported understanding. Normal response to modalities upon removal.    Clinical Presentation  Stable    Clinical Decision Making  Low    Rehab Potential  Excellent    PT Frequency  2x / week    PT Duration  8 weeks    PT Treatment/Interventions  ADLs/Self Care Home Management;Cryotherapy;Electrical Stimulation;Ultrasound;Functional mobility training;Therapeutic activities;Therapeutic exercise;Neuromuscular re-education;Patient/family education;Manual techniques    PT Next Visit Plan  Left hip strengthening; gait training; Nustep/staionary bike.  Modalites and STW/MPRN.    Consulted and Agree with Plan of Care  Patient       Patient will benefit from skilled therapeutic intervention in order to improve the following deficits and impairments:  Abnormal gait, Decreased activity tolerance, Decreased range of motion, Decreased strength, Pain  Visit Diagnosis: Pain in left hip  Muscle weakness (generalized)     Problem List Patient Active Problem List   Diagnosis Date Noted  . Closed left hip fracture (HCC) 02/10/2018  . CKD (chronic kidney disease) stage 3, GFR 30-59 ml/min (HCC) 02/10/2018  . Controlled type 2 diabetes mellitus with  hyperglycemia (HCC) 02/10/2018  . Hip fracture (HCC) 02/10/2018  .  Pressure injury of skin 02/10/2018  . Chest pain 02/19/2016  . Arthritis of left knee 04/09/2014  . Status post total knee replacement 04/09/2014  . Arthropathy   . Diabetes (HCC)   . Hypertension   . Kidney stones   . Hypothyroidism   . Degenerative arthritis of knee 06/09/2012  . Flatulence, eructation, and gas pain 12/24/2005  . Abdominal or pelvic swelling, mass, or lump, other specified site 12/24/2005  . Osteoporosis 12/04/2004  . Symptomatic menopausal or female climacteric states 12/04/2004  . Arthropathy, lower leg 04/21/2004  . Hematuria 09/17/2003  . Other long term (current) drug therapy 07/05/2002  . Hyperlipidemia, mixed 03/01/2000   Guss BundeKrystle Cieara Stierwalt, PT, DPT 06/01/2018, 4:39 PM  [facility] [address] [place], [place], [address] Phone: [phone]   Fax:  [phone]  [name] MRN: [id number] Date of Birth: [birth year]

## 2018-06-05 ENCOUNTER — Ambulatory Visit: Payer: Medicare Other | Admitting: Physical Therapy

## 2018-06-05 DIAGNOSIS — M6281 Muscle weakness (generalized): Secondary | ICD-10-CM | POA: Diagnosis not present

## 2018-06-05 DIAGNOSIS — M25552 Pain in left hip: Secondary | ICD-10-CM | POA: Diagnosis not present

## 2018-06-05 NOTE — Therapy (Signed)
[facility] [address] [place], [place], [address] Phone: [phone]   Fax:  [phone]  Physical Therapy Treatment  Patient Details  [name] MRN: [id number] Date of Birth: [birth year] Referring Provider: Rex Krasarol Vincent MD.   Encounter Date: 06/05/2018  PT End of Session - 06/05/18 1128    Visit Number  9    Number of Visits  16    Date for PT Re-Evaluation  08/06/18    PT Start Time  0945    PT Stop Time  1043    PT Time Calculation (min)  58 min    Activity Tolerance  Patient tolerated treatment well    Behavior During Therapy  [facility] for tasks assessed/performed       Past Medical History:  Diagnosis Date  . Arthritis    OA AND PAIN BOTH KNEES -RIGHT KNEE HURTS WORSE THAN LEFT  . Diabetes mellitus    ORAL MEDICATION - NO INSULIN  . Hypertension   . Hypothyroidism   . Kidney stones    NONE AT PRESENT TIME THAT PT IS AWARE OF    Past Surgical History:  Procedure Laterality Date  . ABDOMINAL HYSTERECTOMY    . CHOLECYSTECTOMY  1972  . COLONOSCOPY    . EYE SURGERY     RIGHT CATARACT EXTRACTON WITH LENS IMPLANT  . GROWTH REMOVED FROM THYROID    . INTRAMEDULLARY (IM) NAIL INTERTROCHANTERIC Left 02/10/2018   Procedure: INTRAMEDULLARY (IM) NAIL INTERTROCHANTRIC;  Surgeon: Kathryne HitchBlackman, Christopher Y, MD;  Location: [facility];  Service: Orthopedics;  Laterality: Left;  . PERCUTANEOUS NEPHROLITHOTOMY    . TOTAL KNEE ARTHROPLASTY  06/09/2012   Procedure: TOTAL KNEE ARTHROPLASTY;  Surgeon: Kathryne Hitchhristopher Y Blackman, MD;  Location: [facility];  Service: Orthopedics;  Laterality: Right;  Right Total Knee Arthroplasty  . TOTAL KNEE ARTHROPLASTY Left 04/09/2014   Procedure: LEFT TOTAL KNEE ARTHROPLASTY;  Surgeon: Kathryne Hitchhristopher Y Blackman, MD;  [facility];  Service: Orthopedics;  Laterality: Left;  . TUBAL LIGATION      There were no vitals filed for this visit.  Subjective Assessment - 06/05/18 1129    Subjective  Doing okay.    Currently in Pain?  Yes    Pain Score  4     Pain Location  Hip    Pain Orientation  Left    Pain Descriptors / Indicators  Tender    Pain Type  Acute pain    Pain Onset  More than a month ago                       [facility] - 06/05/18 0001      Exercises   Exercises  Knee/Hip      Knee/Hip Exercises: Aerobic   Stationary Bike  Seat 6 on Spirit bilke x 15 minutes.      Knee/Hip Exercises: Machines for Strengthening   Cybex Knee Extension  10# x 4 minutes.    Cybex Knee Flexion  30# x 4 minutes.      Modalities   Modalities  Electrical Stimulation;Moist Heat      Moist Heat Therapy   Number Minutes Moist Heat  20 Minutes    Moist Heat Location  --   Left hip.     Programme researcher, broadcasting/film/videolectrical Stimulation   Electrical Stimulation Location  Left hip.    Electrical Stimulation Action  IFC    Electrical Stimulation Parameters  80-150 Hz x 20 minutes.    Lobbyistlectrical  Stimulation Goals  Pain               PT Short Term Goals - 05/08/18 1151      PT SHORT TERM GOAL #1   Title  STG's=LTG's.        PT Long Term Goals - 05/25/18 1245      PT LONG TERM GOAL #1   Title  Ind with an HEP.    Time  8    Period  Weeks    Status  On-going      PT LONG TERM GOAL #2   Title  5/5 left hip abduction strength to increase stability for functional tasks.    Time  8    Period  Weeks    Status  On-going      PT LONG TERM GOAL #3   Title  Walk in clinic 500 feet with straight cane.    Time  8    Period  Weeks    Status  On-going      PT LONG TERM GOAL #4   Title  Perform a reciprocating stair gait with one railing.    Time  8    Period  Weeks    Status  On-going            Plan - 06/05/18 1132    Clinical Impression Statement  Very good job today with patient able to make forward revolutions on stationary bike today.  No pain increase.      PT Treatment/Interventions  ADLs/Self Care Home Management;Cryotherapy;Electrical  Stimulation;Ultrasound;Functional mobility training;Therapeutic activities;Therapeutic exercise;Neuromuscular re-education;Patient/family education;Manual techniques    PT Next Visit Plan  Left hip strengthening; gait training; Nustep/staionary bike.  Modalites and STW/MPRN.    PT Home Exercise Plan  standing hip abd, gait with a straight cane short distances, hip flexion ( all standing exercises to be done with UE support)    Consulted and Agree with Plan of Care  Patient       Patient will benefit from skilled therapeutic intervention in order to improve the following deficits and impairments:  Abnormal gait, Decreased activity tolerance, Decreased range of motion, Decreased strength, Pain  Visit Diagnosis: Pain in left hip  Muscle weakness (generalized)     Problem List Patient Active Problem List   Diagnosis Date Noted  . Closed left hip fracture (HCC) 02/10/2018  . CKD (chronic kidney disease) stage 3, GFR 30-59 ml/min (HCC) 02/10/2018  . Controlled type 2 diabetes mellitus with hyperglycemia (HCC) 02/10/2018  . Hip fracture (HCC) 02/10/2018  . Pressure injury of skin 02/10/2018  . Chest pain 02/19/2016  . Arthritis of left knee 04/09/2014  . Status post total knee replacement 04/09/2014  . Arthropathy   . Diabetes (HCC)   . Hypertension   . Kidney stones   . Hypothyroidism   . Degenerative arthritis of knee 06/09/2012  . Flatulence, eructation, and gas pain 12/24/2005  . Abdominal or pelvic swelling, mass, or lump, other specified site 12/24/2005  . Osteoporosis 12/04/2004  . Symptomatic menopausal or female climacteric states 12/04/2004  . Arthropathy, lower leg 04/21/2004  . Hematuria 09/17/2003  . Other long term (current) drug therapy 07/05/2002  . Hyperlipidemia, mixed 03/01/2000    Ladeidra Borys, [place]  MPT 06/05/2018, 11:34 AM  [facility] [address] [place], [place], [address] Phone: [phone]   Fax:   [phone]  [name] MRN: [id number] Date of Birth: September 23, 1946

## 2018-06-06 ENCOUNTER — Other Ambulatory Visit: Payer: Self-pay | Admitting: Pediatrics

## 2018-06-06 ENCOUNTER — Ambulatory Visit (INDEPENDENT_AMBULATORY_CARE_PROVIDER_SITE_OTHER): Payer: Medicare Other | Admitting: Pediatrics

## 2018-06-06 ENCOUNTER — Telehealth: Payer: Self-pay | Admitting: *Deleted

## 2018-06-06 ENCOUNTER — Encounter: Payer: Self-pay | Admitting: Pediatrics

## 2018-06-06 ENCOUNTER — Encounter: Payer: Self-pay | Admitting: *Deleted

## 2018-06-06 VITALS — BP 139/84 | HR 62 | Temp 97.8°F | Ht 67.01 in | Wt 171.8 lb

## 2018-06-06 DIAGNOSIS — I10 Essential (primary) hypertension: Secondary | ICD-10-CM

## 2018-06-06 DIAGNOSIS — E785 Hyperlipidemia, unspecified: Secondary | ICD-10-CM

## 2018-06-06 DIAGNOSIS — E1169 Type 2 diabetes mellitus with other specified complication: Secondary | ICD-10-CM | POA: Diagnosis not present

## 2018-06-06 DIAGNOSIS — E119 Type 2 diabetes mellitus without complications: Secondary | ICD-10-CM

## 2018-06-06 LAB — BMP8+EGFR
BUN / CREAT RATIO: 18 (ref 12–28)
BUN: 24 mg/dL (ref 8–27)
CHLORIDE: 104 mmol/L (ref 96–106)
CO2: 22 mmol/L (ref 20–29)
Calcium: 9.3 mg/dL (ref 8.7–10.3)
Creatinine, Ser: 1.3 mg/dL — ABNORMAL HIGH (ref 0.57–1.00)
GFR calc Af Amer: 48 mL/min/{1.73_m2} — ABNORMAL LOW (ref >59)
GFR calc non Af Amer: 41 mL/min/{1.73_m2} — ABNORMAL LOW (ref >59)
GLUCOSE: 189 mg/dL — AB (ref 65–99)
Potassium: 3.8 mmol/L (ref 3.5–5.2)
Sodium: 142 mmol/L (ref 134–144)

## 2018-06-06 LAB — BAYER DCA HB A1C WAIVED: HB A1C: 7.4 % — AB (ref <7.0)

## 2018-06-06 MED ORDER — PRAVASTATIN SODIUM 40 MG PO TABS: 40.0000 mg | ORAL_TABLET | Freq: Every day | ORAL | 1 refills | Status: DC

## 2018-06-06 MED ORDER — LISINOPRIL 10 MG PO TABS: 10.0000 mg | ORAL_TABLET | Freq: Every day | ORAL | 0 refills | Status: DC

## 2018-06-06 MED ORDER — METFORMIN HCL ER 500 MG PO TB24: 500.0000 mg | ORAL_TABLET | Freq: Two times a day (BID) | ORAL | 3 refills | Status: DC

## 2018-06-06 MED ORDER — AMLODIPINE BESYLATE 5 MG PO TABS: 5.0000 mg | ORAL_TABLET | Freq: Every day | ORAL | 1 refills | Status: DC

## 2018-06-06 MED ORDER — METOPROLOL TARTRATE 100 MG PO TABS: 100.0000 mg | ORAL_TABLET | Freq: Two times a day (BID) | ORAL | 1 refills | Status: DC

## 2018-06-06 NOTE — Progress Notes (Signed)
  Subjective:   Patient ID: Meredith Shaw, female    DOB: 12/05/1945, 72 y.o.   MRN: [id number] CC: Medical Management of Chronic Issues (2 month)  HPI: Meredith Shaw is a 72 y.o. female   Here with daughter for follow-up.  Osteoporosis: Has had a fracture in the last few months.  Open to doing Prolia.  History of CKD stage III.  Diabetes: Taking medicines regularly.  Hypertension: Taking medicines regularly.  No lightheadedness or dizziness.  Hyperlipidemia: Taking medicine regularly.  Relevant past medical, surgical, family and social history reviewed. Allergies and medications reviewed and updated. Social History   Tobacco Use  Smoking Status Never Smoker  Smokeless Tobacco Never Used   ROS: Per HPI   Objective:    BP 139/84   Pulse 62   Temp 97.8 F (36.6 C) (Oral)   Ht 5' 7.01" (1.702 m)   Wt 171 lb 12.8 oz (77.9 kg)   BMI 26.90 kg/m   Wt Readings from Last 3 Encounters:  06/06/18 171 lb 12.8 oz (77.9 kg)  04/05/18 173 lb 12.8 oz (78.8 kg)  03/10/18 168 lb (76.2 kg)    Gen: NAD, alert, cooperative with exam, NCAT EYES: EOMI, no conjunctival injection, or no icterus ENT:  TMs pearly gray b/l, OP without erythema CV: NRRR, normal S1/S2 Resp: CTABL, no wheezes, normal WOB Abd: +BS, soft, NTND. no guarding or organomegaly Ext: No edema, warm Neuro: Alert and oriented MSK: normal muscle bulk  Assessment & Plan:  Vernice was seen today for medical management of chronic issues.  Diagnoses and all orders for this visit:  Type 2 diabetes mellitus without complication, without long-term current use of insulin (HCC) A1c 7.4.  Continue current medicines. -     Microalbumin / creatinine urine ratio -     BMP8+EGFR -     Bayer DCA Hb A1c Waived -     metFORMIN (GLUCOPHAGE-XR) 500 MG 24 hr tablet; Take 1 tablet (500 mg total) by mouth 2 (two) times daily.  Essential hypertension Stable, no lightheadedness or dizziness on current dosing.  Continue below. -      metoprolol tartrate (LOPRESSOR) 100 MG tablet; Take 1 tablet (100 mg total) by mouth 2 (two) times daily. -     amLODipine (NORVASC) 5 MG tablet; Take 1 tablet (5 mg total) by mouth daily. -     lisinopril (PRINIVIL,ZESTRIL) 10 MG tablet; Take 1 tablet (10 mg total) by mouth daily.  Hyperlipidemia associated with type 2 diabetes mellitus (HCC) Stable, continue below -     pravastatin (PRAVACHOL) 40 MG tablet; Take 1 tablet (40 mg total) by mouth at bedtime.   Follow up plan: Return in about 2 months (around 08/06/2018). Assunta Found, MD [place]

## 2018-06-06 NOTE — Telephone Encounter (Signed)
   Tried to call patient but her voicemail was not setup. She would owe approximately $240. She may qualify for [facility].

## 2018-06-07 LAB — MICROALBUMIN / CREATININE URINE RATIO
CREATININE, UR: 199.3 mg/dL
MICROALBUM., U, RANDOM: 1429.3 ug/mL
Microalb/Creat Ratio: 717.2 mg/g creat — ABNORMAL HIGH (ref 0.0–30.0)

## 2018-06-08 ENCOUNTER — Ambulatory Visit: Payer: Medicare Other | Admitting: Physical Therapy

## 2018-06-08 ENCOUNTER — Encounter: Payer: Self-pay | Admitting: Physical Therapy

## 2018-06-08 DIAGNOSIS — M6281 Muscle weakness (generalized): Secondary | ICD-10-CM

## 2018-06-08 DIAGNOSIS — M25552 Pain in left hip: Secondary | ICD-10-CM | POA: Diagnosis not present

## 2018-06-08 NOTE — Therapy (Signed)
[facility] [place], [place], [address] Phone: [phone]   Fax:  [phone]  Physical Therapy Treatment  Patient Details  [name] MRN: [id number] Date of Birth: [birth year] Referring Provider: Assunta Found MD.   Encounter Date: 06/08/2018  PT End of Session - 06/08/18 0922    Visit Number  10    Number of Visits  16    Date for PT Re-Evaluation  08/06/18    PT Start Time  0859    PT Stop Time  0957    PT Time Calculation (min)  58 min    Activity Tolerance  Patient tolerated treatment well    Behavior During Therapy  [facility] for tasks assessed/performed       Past Medical History:  Diagnosis Date  . Arthritis    OA AND PAIN BOTH KNEES -RIGHT KNEE HURTS WORSE THAN LEFT  . Diabetes mellitus    ORAL MEDICATION - NO INSULIN  . Hypertension   . Hypothyroidism   . Kidney stones    NONE AT PRESENT TIME THAT PT IS AWARE OF    Past Surgical History:  Procedure Laterality Date  . ABDOMINAL HYSTERECTOMY    . CHOLECYSTECTOMY  1972  . COLONOSCOPY    . EYE SURGERY     RIGHT CATARACT EXTRACTON WITH LENS IMPLANT  . GROWTH REMOVED FROM THYROID    . INTRAMEDULLARY (IM) NAIL INTERTROCHANTERIC Left 02/10/2018   Procedure: INTRAMEDULLARY (IM) NAIL INTERTROCHANTRIC;  Surgeon: Mcarthur Rossetti, MD;  Location: [facility];  Service: Orthopedics;  Laterality: Left;  . PERCUTANEOUS NEPHROLITHOTOMY    . TOTAL KNEE ARTHROPLASTY  06/09/2012   Procedure: TOTAL KNEE ARTHROPLASTY;  Surgeon: Mcarthur Rossetti, MD;  Location: [facility];  Service: Orthopedics;  Laterality: Right;  Right Total Knee Arthroplasty  . TOTAL KNEE ARTHROPLASTY Left 04/09/2014   Procedure: LEFT TOTAL KNEE ARTHROPLASTY;  Surgeon: Mcarthur Rossetti, MD;  Location: [place];  Service: Orthopedics;  Laterality: Left;  . TUBAL LIGATION      There were no vitals filed for this visit.  Subjective Assessment - 06/08/18 0907    Subjective  Patient arrived  with minimal discomfort, some ongoing swelling in LE, went to get doppler yet it was negitive, unsure of swelling per MD    Pertinent History  DM; OP.    How long can you walk comfortably?  Short distances with walker.    Patient Stated Goals  See above.    Currently in Pain?  Yes    Pain Score  2     Pain Location  Hip    Pain Orientation  Left    Pain Descriptors / Indicators  Tender;Discomfort    Pain Type  Acute pain    Pain Onset  More than a month ago    Pain Frequency  Constant    Aggravating Factors   prolong activity    Pain Relieving Factors  at rest                       [facility] Adult PT Treatment/Exercise - 06/08/18 0001      Ambulation/Gait   Ambulation/Gait  Yes    Ambulation Distance (Feet)  500 Feet    Assistive device  Straight cane    Gait Pattern  Decreased arm swing - right;Decreased arm swing - left;Decreased step length - right;Decreased step length - left;Decreased stance time - left    Ambulation Surface  Level;Unlevel;Indoor;Outdoor  Gait Comments  Minimal Trendelenburg gait pattern.  Pt using a SPC      Exercises   Exercises  Knee/Hip      Knee/Hip Exercises: Aerobic   Nustep  L5 x15 min UE/LE      Knee/Hip Exercises: Machines for Strengthening   Cybex Knee Extension  10# 3x10    Cybex Knee Flexion  30# 3x10      Knee/Hip Exercises: Standing   Hip Abduction  Stengthening;Both;2 sets;10 reps;Knee straight;Other (comment);Limitations    Abduction Limitations  with yellow t-band    Lateral Step Up  Left;2 sets;10 reps;Step Height: 6"    Forward Step Up  Both;2 sets;10 reps;Hand Hold: 2;Step Height: 6"      Moist Heat Therapy   Number Minutes Moist Heat  15 Minutes    Moist Heat Location  Hip      Electrical Stimulation   Electrical Stimulation Location  Left hip.    Electrical Stimulation Action  IFC    Electrical Stimulation Parameters  80-150hz  x61mn    Electrical Stimulation Goals  Pain               PT Short  Term Goals - 05/08/18 1151      PT SHORT TERM GOAL #1   Title  STG's=LTG's.        PT Long Term Goals - 06/08/18 01478     PT LONG TERM GOAL #1   Title  Ind with an HEP.    Time  8    Period  Weeks    Status  On-going      PT LONG TERM GOAL #2   Title  5/5 left hip abduction strength to increase stability for functional tasks.    Time  8    Period  Weeks    Status  On-going   NT 06/08/18     PT LONG TERM GOAL #3   Title  Walk in clinic 500 feet with straight cane.    Time  8    Period  Weeks    Status  Achieved   06/08/18     PT LONG TERM GOAL #4   Title  Perform a reciprocating stair gait with one railing.    Time  8    Period  Weeks    Status  On-going   non reciprocating 05/29/18           Plan - 06/08/18 0950    Clinical Impression Statement  Patient tolerated treatment well today. Patient progressing with LE strength activities and able to perform gait training with SPC with no difficulty. Patient has some difficulty with stairs and ongoing weakness in hip. Patient met LTG #3 others progressing.     Rehab Potential  Excellent    PT Frequency  2x / week    PT Duration  8 weeks    PT Treatment/Interventions  ADLs/Self Care Home Management;Cryotherapy;Electrical Stimulation;Ultrasound;Functional mobility training;Therapeutic activities;Therapeutic exercise;Neuromuscular re-education;Patient/family education;Manual techniques    PT Next Visit Plan  Left hip strengthening; gait training; Nustep/staionary bike.  Modalites and STW/PRN.    Consulted and Agree with Plan of Care  Patient       Patient will benefit from skilled therapeutic intervention in order to improve the following deficits and impairments:  Abnormal gait, Decreased activity tolerance, Decreased range of motion, Decreased strength, Pain  Visit Diagnosis: Pain in left hip  Muscle weakness (generalized)     Problem List Patient Active Problem List   Diagnosis Date Noted  .  Type 2 diabetes  mellitus with complication, without long-term current use of insulin ([place]) 02/15/2018  . Closed left hip fracture ([place]) 02/10/2018  . CKD (chronic kidney disease) stage 3, GFR 30-59 ml/min (HCC) 02/10/2018  . Controlled type 2 diabetes mellitus with hyperglycemia ([place]) 02/10/2018  . Hip fracture ([place]) 02/10/2018  . Pressure injury of skin 02/10/2018  . Chest pain 02/19/2016  . Arthritis of left knee 04/09/2014  . Status post total knee replacement 04/09/2014  . Arthropathy   . Diabetes ([place])   . Hypertension   . Kidney stones   . Hypothyroidism   . Degenerative arthritis of knee 06/09/2012  . Flatulence, eructation, and gas pain 12/24/2005  . Abdominal or pelvic swelling, mass, or lump, other specified site 12/24/2005  . Osteoporosis 12/04/2004  . Symptomatic menopausal or female climacteric states 12/04/2004  . Arthropathy, lower leg 04/21/2004  . Hematuria 09/17/2003  . Other long term (current) drug therapy 07/05/2002  . Hyperlipidemia, mixed 03/01/2000    Ladean Raya, PTA 06/08/18 10:09 AM  [facility] [place], [place], [address] Phone: [phone]   Fax:  [phone]  [name] MRN: [id number] Date of Birth: [birth year]  Progress Note Reporting Period 05/08/18 to 06/08/18  See note below for Objective Data and Assessment of Progress/Goals.  Excellent progress.  Patient has progressed to stationary bike and has now met LTG #3.  Mali Applegate MPT

## 2018-06-11 ENCOUNTER — Encounter: Payer: Self-pay | Admitting: Pediatrics

## 2018-06-12 DIAGNOSIS — M25552 Pain in left hip: Secondary | ICD-10-CM | POA: Diagnosis not present

## 2018-06-12 DIAGNOSIS — M8000XA Age-related osteoporosis with current pathological fracture, unspecified site, initial encounter for fracture: Secondary | ICD-10-CM | POA: Diagnosis not present

## 2018-06-13 ENCOUNTER — Ambulatory Visit: Payer: Medicare Other | Attending: Pediatrics | Admitting: *Deleted

## 2018-06-13 DIAGNOSIS — M6281 Muscle weakness (generalized): Secondary | ICD-10-CM

## 2018-06-13 DIAGNOSIS — M25552 Pain in left hip: Secondary | ICD-10-CM | POA: Diagnosis not present

## 2018-06-13 NOTE — Therapy (Signed)
[facility] [place], [place], [address] Phone: [phone]   Fax:  [phone]  Physical Therapy Treatment  Patient Details  [name] MRN: [id number] Date of Birth: 30-May-1946 Referring Provider: Assunta Found MD.   Encounter Date: 06/13/2018  PT End of Session - 06/13/18 1050    Visit Number  11    Number of Visits  16    Date for PT Re-Evaluation  08/06/18    PT Start Time  1030    PT Stop Time  1125    PT Time Calculation (min)  55 min       Past Medical History:  Diagnosis Date  . Arthritis    OA AND PAIN BOTH KNEES -RIGHT KNEE HURTS WORSE THAN LEFT  . Diabetes mellitus    ORAL MEDICATION - NO INSULIN  . Hypertension   . Hypothyroidism   . Kidney stones    NONE AT PRESENT TIME THAT PT IS AWARE OF    Past Surgical History:  Procedure Laterality Date  . ABDOMINAL HYSTERECTOMY    . CHOLECYSTECTOMY  1972  . COLONOSCOPY    . EYE SURGERY     RIGHT CATARACT EXTRACTON WITH LENS IMPLANT  . GROWTH REMOVED FROM THYROID    . INTRAMEDULLARY (IM) NAIL INTERTROCHANTERIC Left 02/10/2018   Procedure: INTRAMEDULLARY (IM) NAIL INTERTROCHANTRIC;  Surgeon: Mcarthur Rossetti, MD;  Location: [facility];  Service: Orthopedics;  Laterality: Left;  . PERCUTANEOUS NEPHROLITHOTOMY    . TOTAL KNEE ARTHROPLASTY  06/09/2012   Procedure: TOTAL KNEE ARTHROPLASTY;  Surgeon: Mcarthur Rossetti, MD;  [facility];  Service: Orthopedics;  Laterality: Right;  Right Total Knee Arthroplasty  . TOTAL KNEE ARTHROPLASTY Left 04/09/2014   Procedure: LEFT TOTAL KNEE ARTHROPLASTY;  Surgeon: Mcarthur Rossetti, MD;  Location: [place];  Service: Orthopedics;  Laterality: Left;  . TUBAL LIGATION      There were no vitals filed for this visit.  Subjective Assessment - 06/13/18 1205    Subjective  Did ok after last Rx.  Patient arrived with minimal discomfort,   (Pended)     Pertinent History  DM; OP.  (Pended)     How long can  you walk comfortably?  Short distances with walker.  (Pended)     Patient Stated Goals  See above.  (Pended)     Pain Score  2   (Pended)                        [facility] Adult PT Treatment/Exercise - 06/13/18 0001      Exercises   Exercises  Knee/Hip      Knee/Hip Exercises: Aerobic   Nustep  L5 x15 min UE/LE      Knee/Hip Exercises: Machines for Strengthening   Cybex Knee Extension  10# 3x10    Cybex Knee Flexion  30# 3x10      Knee/Hip Exercises: Standing   Hip Abduction  Stengthening;Both;2 sets;10 reps;Knee straight;Other (comment);Limitations    Abduction Limitations  with yellow t-band    Lateral Step Up  Left;2 sets;10 reps;Step Height: 6"    Forward Step Up  Both;2 sets;10 reps;Hand Hold: 2;Step Height: 6"      Modalities   Modalities  Electrical Stimulation;Moist Heat      Moist Heat Therapy   Number Minutes Moist Heat  15 Minutes    Moist Heat Location  Hip      Electrical Stimulation   Electrical Stimulation Location  Left hip. IFC x 15 mijs 80-150 hz    Electrical Stimulation Goals  Pain               PT Short Term Goals - 05/08/18 1151      PT SHORT TERM GOAL #1   Title  STG's=LTG's.        PT Long Term Goals - 06/08/18 8295      PT LONG TERM GOAL #1   Title  Ind with an HEP.    Time  8    Period  Weeks    Status  On-going      PT LONG TERM GOAL #2   Title  5/5 left hip abduction strength to increase stability for functional tasks.    Time  8    Period  Weeks    Status  On-going   NT 06/08/18     PT LONG TERM GOAL #3   Title  Walk in clinic 500 feet with straight cane.    Time  8    Period  Weeks    Status  Achieved   06/08/18     PT LONG TERM GOAL #4   Title  Perform a reciprocating stair gait with one railing.    Time  8    Period  Weeks    Status  On-going   non reciprocating 05/29/18           Plan - 06/13/18 1258    Clinical Impression Statement  Pt arrived today doing fairly well and feels like she  is progressing with gait and LT LE strength. She was able to complete all therex today with mainly fatigue. She continues to have some difficuty with stairs and ongoing weakness in hip. LTG #3 met    Clinical Presentation  Stable    Rehab Potential  Excellent    PT Frequency  2x / week    PT Duration  8 weeks    PT Treatment/Interventions  ADLs/Self Care Home Management;Cryotherapy;Electrical Stimulation;Ultrasound;Functional mobility training;Therapeutic activities;Therapeutic exercise;Neuromuscular re-education;Patient/family education;Manual techniques    PT Next Visit Plan  Left hip strengthening; gait training; Nustep/staionary bike.  Modalites and STW/PRN.    PT Home Exercise Plan  standing hip abd, gait with a straight cane short distances, hip flexion ( all standing exercises to be done with UE support)    Consulted and Agree with Plan of Care  Patient       Patient will benefit from skilled therapeutic intervention in order to improve the following deficits and impairments:  Abnormal gait, Decreased activity tolerance, Decreased range of motion, Decreased strength, Pain  Visit Diagnosis: Pain in left hip  Muscle weakness (generalized)     Problem List Patient Active Problem List   Diagnosis Date Noted  . Type 2 diabetes mellitus with complication, without long-term current use of insulin ([place]) 02/15/2018  . Closed left hip fracture ([place]) 02/10/2018  . CKD (chronic kidney disease) stage 3, GFR 30-59 ml/min (HCC) 02/10/2018  . Controlled type 2 diabetes mellitus with hyperglycemia ([place]) 02/10/2018  . Hip fracture ([place]) 02/10/2018  . Pressure injury of skin 02/10/2018  . Chest pain 02/19/2016  . Arthritis of left knee 04/09/2014  . Status post total knee replacement 04/09/2014  . Arthropathy   . Diabetes ([place])   . Hypertension   . Kidney stones   . Hypothyroidism   . Degenerative arthritis of knee 06/09/2012  . Flatulence, eructation, and gas pain 12/24/2005  .  Abdominal or pelvic swelling, mass, or  lump, other specified site 12/24/2005  . Osteoporosis 12/04/2004  . Symptomatic menopausal or female climacteric states 12/04/2004  . Arthropathy, lower leg 04/21/2004  . Hematuria 09/17/2003  . Other long term (current) drug therapy 07/05/2002  . Hyperlipidemia, mixed 03/01/2000    Adanely Reynoso,CHRIS, PTA 06/13/2018, 1:24 PM  [facility] [place], [place], [address] Phone: [phone]   Fax:  [phone]  [name] MRN: [id number] Date of Birth: 1946-07-20

## 2018-06-16 NOTE — Telephone Encounter (Signed)
Duplicate encounter

## 2018-06-20 ENCOUNTER — Encounter: Payer: Self-pay | Admitting: Physical Therapy

## 2018-06-20 ENCOUNTER — Ambulatory Visit: Payer: Medicare Other | Admitting: Physical Therapy

## 2018-06-20 DIAGNOSIS — M25552 Pain in left hip: Secondary | ICD-10-CM

## 2018-06-20 DIAGNOSIS — M6281 Muscle weakness (generalized): Secondary | ICD-10-CM

## 2018-06-20 NOTE — Therapy (Addendum)
[facility] [address] [place], [place], [address] Phone: [phone]   Fax:  [phone]  Physical Therapy Treatment  Patient Details  [name] MRN: [id number] Date of Birth: 1946/09/03 Referring Provider: Rex Kras MD.   Encounter Date: 06/20/2018  PT End of Session - 06/20/18 0910    Visit Number  12    Number of Visits  16    Date for PT Re-Evaluation  08/06/18    PT Start Time  0901    PT Stop Time  0948    PT Time Calculation (min)  47 min    Activity Tolerance  Patient tolerated treatment well    Behavior During Therapy  [facility] for tasks assessed/performed       Past Medical History:  Diagnosis Date  . Arthritis    OA AND PAIN BOTH KNEES -RIGHT KNEE HURTS WORSE THAN LEFT  . Diabetes mellitus    ORAL MEDICATION - NO INSULIN  . Hypertension   . Hypothyroidism   . Kidney stones    NONE AT PRESENT TIME THAT PT IS AWARE OF    Past Surgical History:  Procedure Laterality Date  . ABDOMINAL HYSTERECTOMY    . CHOLECYSTECTOMY  1972  . COLONOSCOPY    . EYE SURGERY     RIGHT CATARACT EXTRACTON WITH LENS IMPLANT  . GROWTH REMOVED FROM THYROID    . INTRAMEDULLARY (IM) NAIL INTERTROCHANTERIC Left 02/10/2018   Procedure: INTRAMEDULLARY (IM) NAIL INTERTROCHANTRIC;  Surgeon: Kathryne Hitch, MD;  Location: [facility] OR;  Service: Orthopedics;  Laterality: Left;  . PERCUTANEOUS NEPHROLITHOTOMY    . TOTAL KNEE ARTHROPLASTY  06/09/2012   Procedure: TOTAL KNEE ARTHROPLASTY;  Surgeon: Kathryne Hitch, MD;  Location: [facility];  Service: Orthopedics;  Laterality: Right;  Right Total Knee Arthroplasty  . TOTAL KNEE ARTHROPLASTY Left 04/09/2014   Procedure: LEFT TOTAL KNEE ARTHROPLASTY;  Surgeon: Kathryne Hitch, MD;  Location: [facility] OR;  Service: Orthopedics;  Laterality: Left;  . TUBAL LIGATION      There were no vitals filed for this visit.  Subjective Assessment - 06/20/18 0906    Subjective  Reports that  she had a busy weekend and was up and down and had some pain. Denies any pain upon arrival.    Pertinent History  DM; OP.    How long can you walk comfortably?  Short distances with walker.    Patient Stated Goals  See above.    Currently in Pain?  No/denies         [facility] PT Assessment - 06/20/18 0001      Assessment   Medical Diagnosis  Left hip fracture; Balance disorder.    Onset Date/Surgical Date  02/10/18    Next MD Visit  06/22/2018      Precautions   Precautions  Fall      Restrictions   Weight Bearing Restrictions  No                   [facility] Adult PT Treatment/Exercise - 06/20/18 0001      Knee/Hip Exercises: Aerobic   Nustep  L5 x15 min UE/LE      Knee/Hip Exercises: Machines for Strengthening   Cybex Knee Extension  10# 3x10    Cybex Knee Flexion  30# 3x10      Knee/Hip Exercises: Standing   Hip Flexion  Stengthening;Both;2 sets;10 reps;Knee straight   2#   Hip Abduction  Stengthening;Both;2 sets;10 reps;Knee straight;Other (comment);Limitations  Abduction Limitations  with yellow t-band    Hip Extension  Stengthening;Both;2 sets;10 reps;Knee straight   yellow theraband   Lateral Step Up  Left;2 sets;10 reps;Step Height: 6"    Forward Step Up  Step Height: 6";Left;2 sets;10 reps;Hand Hold: 2      Knee/Hip Exercises: Seated   Sit to Sand  10 reps;with UE support   minimal UE support              PT Short Term Goals - 05/08/18 1151      PT SHORT TERM GOAL #1   Title  STG's=LTG's.        PT Long Term Goals - 06/08/18 1610      PT LONG TERM GOAL #1   Title  Ind with an HEP.    Time  8    Period  Weeks    Status  On-going      PT LONG TERM GOAL #2   Title  5/5 left hip abduction strength to increase stability for functional tasks.    Time  8    Period  Weeks    Status  On-going   NT 06/08/18     PT LONG TERM GOAL #3   Title  Walk in clinic 500 feet with straight cane.    Time  8    Period  Weeks    Status  Achieved    06/08/18     PT LONG TERM GOAL #4   Title  Perform a reciprocating stair gait with one railing.    Time  8    Period  Weeks    Status  On-going   non reciprocating 05/29/18           Plan - 06/20/18 1020    Clinical Impression Statement  Patient tolerated today's treatment well as she arrived with no current L hip pain. Patient feeling more confident with ambulation without AD around her home. Patient guided through resisted LE strengthening without complaints of any exaggerated pain in LEs. Only minimal UE support required for patient to stand from a low seat.     Rehab Potential  Excellent    PT Frequency  2x / week    PT Duration  8 weeks    PT Treatment/Interventions  ADLs/Self Care Home Management;Cryotherapy;Electrical Stimulation;Ultrasound;Functional mobility training;Therapeutic activities;Therapeutic exercise;Neuromuscular re-education;Patient/family education;Manual techniques    PT Next Visit Plan  Continue per MD discretion.    PT Home Exercise Plan  standing hip abd, gait with a straight cane short distances, hip flexion ( all standing exercises to be done with UE support)    Consulted and Agree with Plan of Care  Patient       Patient will benefit from skilled therapeutic intervention in order to improve the following deficits and impairments:  Abnormal gait, Decreased activity tolerance, Decreased range of motion, Decreased strength, Pain  Visit Diagnosis: Pain in left hip  Muscle weakness (generalized)     Problem List Patient Active Problem List   Diagnosis Date Noted  . Type 2 diabetes mellitus with complication, without long-term current use of insulin (HCC) 02/15/2018  . Closed left hip fracture (HCC) 02/10/2018  . CKD (chronic kidney disease) stage 3, GFR 30-59 ml/min (HCC) 02/10/2018  . Controlled type 2 diabetes mellitus with hyperglycemia (HCC) 02/10/2018  . Hip fracture (HCC) 02/10/2018  . Pressure injury of skin 02/10/2018  . Chest pain  02/19/2016  . Arthritis of left knee 04/09/2014  . Status post total knee replacement 04/09/2014  .  Arthropathy   . Diabetes (HCC)   . Hypertension   . Kidney stones   . Hypothyroidism   . Degenerative arthritis of knee 06/09/2012  . Flatulence, eructation, and gas pain 12/24/2005  . Abdominal or pelvic swelling, mass, or lump, other specified site 12/24/2005  . Osteoporosis 12/04/2004  . Symptomatic menopausal or female climacteric states 12/04/2004  . Arthropathy, lower leg 04/21/2004  . Hematuria 09/17/2003  . Other long term (current) drug therapy 07/05/2002  . Hyperlipidemia, mixed 03/01/2000    Marvell Fuller, PTA 06/20/18 1:01 PM   [facility] [address] [place], [place], [address] Phone: [phone]   Fax:  [phone]  [name] MRN: [id number] Date of Birth: 10-Nov-1945

## 2018-06-22 ENCOUNTER — Encounter: Payer: Medicare Other | Admitting: Physical Therapy

## 2018-06-22 ENCOUNTER — Ambulatory Visit (INDEPENDENT_AMBULATORY_CARE_PROVIDER_SITE_OTHER): Payer: Medicare Other | Admitting: Physician Assistant

## 2018-06-22 ENCOUNTER — Encounter (INDEPENDENT_AMBULATORY_CARE_PROVIDER_SITE_OTHER): Payer: Self-pay | Admitting: Physician Assistant

## 2018-06-22 ENCOUNTER — Ambulatory Visit (INDEPENDENT_AMBULATORY_CARE_PROVIDER_SITE_OTHER): Payer: Self-pay

## 2018-06-22 DIAGNOSIS — S72142G Displaced intertrochanteric fracture of left femur, subsequent encounter for closed fracture with delayed healing: Secondary | ICD-10-CM

## 2018-06-22 NOTE — Progress Notes (Signed)
xr

## 2018-06-22 NOTE — Progress Notes (Signed)
Office Visit Note   Patient: Meredith Shaw           Date of Birth: 1946/06/28           MRN: [id number] Visit Date: 06/22/2018              Requested by: Meredith Shaw, Meredith Shaw, Shaw [address] [place], [place] [address] PCP: Meredith Shaw, Meredith Shaw, Shaw   Assessment & Plan: Visit Diagnoses:  1. Displaced intertrochanteric fracture of left femur, subsequent encounter for closed fracture with delayed healing     Plan: Patient is activities as tolerated.  She will follow-up with us if she has any concerns or questions.  Questions were encouraged and answered at length today.  Follow-Up Instructions: Return if symptoms worsen or fail to improve.   Orders:  Orders Placed This Encounter  Procedures  . XR HIP UNILAT W OR W/O PELVIS 2-3 VIEWS LEFT   No orders of the defined types were placed in this encounter.     Procedures: No procedures performed   Clinical Data: No additional findings.   Subjective: Chief Complaint  Patient presents with  . Left Hip - Follow-up    HPI Meredith Shaw returns today follow-up of her left hip fracture.  Again patient sustained a left hip intertrochanteric fracture on 02/09/2018 and underwent left hip open reduction internal fixation of the hip fracture with an IM nail and hip screw on 02/10/2018.  She is here today for follow-up.  She states she is getting better.  She states she has 4 more visits with physical therapy.  She is walking without a cane.  She said no fevers chills shortness of breath chest pain Review of Systems See HPI otherwise negative  Objective: Vital Signs: There were no vitals taken for this visit.  Physical Exam  Constitutional: She is oriented to person, place, and time. She appears well-developed and well-nourished. No distress.  Pulmonary/Chest: Effort normal.  Neurological: She is alert and oriented to person, place, and time.  Skin: She is not diaphoretic.    Ortho Exam Left hip good external rotation.  Internal rotation  is limited.  Left calf supple nontender.  She ambulates without any assistive device with a slight antalgic gait. Specialty Comments:  No specialty comments available.  Imaging: Xr Hip Unilat W Or W/o Pelvis 2-3 Views Left  Result Date: 06/22/2018 AP and lateral views left hip: No hardware failure.  Good consolidation of the hip fracture.  No other bony abnormalities.    PMFS History: Patient Active Problem List   Diagnosis Date Noted  . Type 2 diabetes mellitus with complication, without long-term current use of insulin (HCC) 02/15/2018  . Closed left hip fracture (HCC) 02/10/2018  . CKD (chronic kidney disease) stage 3, GFR 30-59 ml/min (HCC) 02/10/2018  . Controlled type 2 diabetes mellitus with hyperglycemia (HCC) 02/10/2018  . Hip fracture (HCC) 02/10/2018  . Pressure injury of skin 02/10/2018  . Chest pain 02/19/2016  . Arthritis of left knee 04/09/2014  . Status post total knee replacement 04/09/2014  . Arthropathy   . Diabetes (HCC)   . Hypertension   . Kidney stones   . Hypothyroidism   . Degenerative arthritis of knee 06/09/2012  . Flatulence, eructation, and gas pain 12/24/2005  . Abdominal or pelvic swelling, mass, or lump, other specified site 12/24/2005  . Osteoporosis 12/04/2004  . Symptomatic menopausal or female climacteric states 12/04/2004  . Arthropathy, lower leg 04/21/2004  . Hematuria 09/17/2003  . Other long  term (current) drug therapy 07/05/2002  . Hyperlipidemia, mixed 03/01/2000   Past Medical History:  Diagnosis Date  . Arthritis    OA AND PAIN BOTH KNEES -RIGHT KNEE HURTS WORSE THAN LEFT  . Diabetes mellitus    ORAL MEDICATION - NO INSULIN  . Hypertension   . Hypothyroidism   . Kidney stones    NONE AT PRESENT TIME THAT PT IS AWARE OF    Family History  Problem Relation Age of Onset  . Heart disease Mother   . Stroke Father   . Heart disease Father   . Healthy Daughter   . Healthy Son     Past Surgical History:  Procedure  Laterality Date  . ABDOMINAL HYSTERECTOMY    . CHOLECYSTECTOMY  1972  . COLONOSCOPY    . EYE SURGERY     RIGHT CATARACT EXTRACTON WITH LENS IMPLANT  . GROWTH REMOVED FROM THYROID    . INTRAMEDULLARY (IM) NAIL INTERTROCHANTERIC Left 02/10/2018   Procedure: INTRAMEDULLARY (IM) NAIL INTERTROCHANTRIC;  Surgeon: Kathryne Hitch, Shaw;  Location: [facility] OR;  Service: Orthopedics;  Laterality: Left;  . PERCUTANEOUS NEPHROLITHOTOMY    . TOTAL KNEE ARTHROPLASTY  06/09/2012   Procedure: TOTAL KNEE ARTHROPLASTY;  Surgeon: Kathryne Hitch, Shaw;  Location: [facility];  Service: Orthopedics;  Laterality: Right;  Right Total Knee Arthroplasty  . TOTAL KNEE ARTHROPLASTY Left 04/09/2014   Procedure: LEFT TOTAL KNEE ARTHROPLASTY;  Surgeon: Kathryne Hitch, Shaw;  Location: [facility] OR;  Service: Orthopedics;  Laterality: Left;  . TUBAL LIGATION     Social History   Occupational History  . Not on file  Tobacco Use  . Smoking status: Never Smoker  . Smokeless tobacco: Never Used  Substance and Sexual Activity  . Alcohol use: No  . Drug use: No  . Sexual activity: Yes

## 2018-06-27 ENCOUNTER — Ambulatory Visit: Payer: Medicare Other | Admitting: Physical Therapy

## 2018-06-27 ENCOUNTER — Encounter: Payer: Self-pay | Admitting: Physical Therapy

## 2018-06-27 DIAGNOSIS — M6281 Muscle weakness (generalized): Secondary | ICD-10-CM | POA: Diagnosis not present

## 2018-06-27 DIAGNOSIS — M25552 Pain in left hip: Secondary | ICD-10-CM

## 2018-06-27 NOTE — Therapy (Signed)
[facility] [address] [place], [place], [address] Phone: [phone]   Fax:  [phone]  Physical Therapy Treatment  Patient Details  Name: Meredith FiremanMarie S Shaw MRN: [id number] Date of Birth: [birth year] Referring Provider: Rex Krasarol Vincent MD.   Encounter Date: 06/27/2018  PT End of Session - 06/27/18 0855    Visit Number  13    Number of Visits  16    Date for PT Re-Evaluation  08/06/18    PT Start Time  0900    PT Stop Time  0946    PT Time Calculation (min)  46 min    Activity Tolerance  Patient tolerated treatment well    Behavior During Therapy  [facility] for tasks assessed/performed       Past Medical History:  Diagnosis Date  . Arthritis    OA AND PAIN BOTH KNEES -RIGHT KNEE HURTS WORSE THAN LEFT  . Diabetes mellitus    ORAL MEDICATION - NO INSULIN  . Hypertension   . Hypothyroidism   . Kidney stones    NONE AT PRESENT TIME THAT PT IS AWARE OF    Past Surgical History:  Procedure Laterality Date  . ABDOMINAL HYSTERECTOMY    . CHOLECYSTECTOMY  1972  . COLONOSCOPY    . EYE SURGERY     RIGHT CATARACT EXTRACTON WITH LENS IMPLANT  . GROWTH REMOVED FROM THYROID    . INTRAMEDULLARY (IM) NAIL INTERTROCHANTERIC Left 02/10/2018   Procedure: INTRAMEDULLARY (IM) NAIL INTERTROCHANTRIC;  Surgeon: Kathryne HitchBlackman, Christopher Y, MD;  Location: [facility] OR;  Service: Orthopedics;  Laterality: Left;  . PERCUTANEOUS NEPHROLITHOTOMY    . TOTAL KNEE ARTHROPLASTY  06/09/2012   Procedure: TOTAL KNEE ARTHROPLASTY;  Surgeon: Kathryne Hitchhristopher Y Blackman, MD;  Location: [facility];  Service: Orthopedics;  Laterality: Right;  Right Total Knee Arthroplasty  . TOTAL KNEE ARTHROPLASTY Left 04/09/2014   Procedure: LEFT TOTAL KNEE ARTHROPLASTY;  Surgeon: Kathryne Hitchhristopher Y Blackman, MD;  [facility];  Service: Orthopedics;  Laterality: Left;  . TUBAL LIGATION      There were no vitals filed for this visit.  Subjective Assessment - 06/27/18 0855    Subjective  States that she  was discharged from her orthopedic surgeon last week and said that her x rays were good as well.    Pertinent History  DM; OP.    How long can you walk comfortably?  Short distances with walker.    Patient Stated Goals  See above.    Currently in Pain?  No/denies         [facility] - 06/27/18 0001      Assessment   Medical Diagnosis  Left hip fracture; Balance disorder.    Onset Date/Surgical Date  02/10/18    Next MD Visit  None      Precautions   Precautions  Fall      Restrictions   Weight Bearing Restrictions  No                   [facility] Adult PT Treatment/Exercise - 06/27/18 0001      Ambulation/Gait   Ambulation/Gait  Yes    Ambulation/Gait Assistance  6: Modified independent (Device/Increase time)    Ambulation Distance (Feet)  90 Feet    Assistive device  None    Gait Pattern  Step-through pattern;Decreased arm swing - right;Decreased arm swing - left;Decreased step length - left;Decreased stance time - left;Decreased stride length;Decreased hip/knee flexion - left;Antalgic;Lateral trunk lean to left;Narrow base of support  Ambulation Surface  Level;Indoor    Stairs  Yes    Stairs Assistance  6: Modified independent (Device/Increase time)    Stair Management Technique  One rail Right;Alternating pattern;Step to pattern;Forwards    Number of Stairs  4   x2 RT   Height of Stairs  6.5      Exercises   Exercises  Knee/Hip      Knee/Hip Exercises: Aerobic   Nustep  L6 x12 min      Knee/Hip Exercises: Machines for Strengthening   Cybex Knee Extension  20# 2x10 reps    Cybex Knee Flexion  40# 2x10 reps      Knee/Hip Exercises: Standing   Hip Flexion  Stengthening;Both;2 sets;10 reps;Knee straight    Hip Flexion Limitations  3#    Hip Abduction  Stengthening;Both;2 sets;10 reps;Knee straight;Other (comment);Limitations    Abduction Limitations  3#    Hip Extension  Stengthening;Both;2 sets;10 reps;Knee straight    Extension Limitations  3#       Knee/Hip Exercises: Supine   Bridges  Strengthening;20 reps    Other Supine Knee/Hip Exercises  B clam green theraband 2x10 reps      Knee/Hip Exercises: Sidelying   Hip ABduction  Strengthening;Both;2 sets;10 reps             PT Education - 06/27/18 0947    Education Details  HEP- clam with green theraband, hip abduction in SL and standing    Person(s) Educated  Patient    Methods  Explanation;Demonstration;Handout    Comprehension  Verbalized understanding       PT Short Term Goals - 05/08/18 1151      PT SHORT TERM GOAL #1   Title  STG's=LTG's.        PT Long Term Goals - 06/08/18 1610      PT LONG TERM GOAL #1   Title  Ind with an HEP.    Time  8    Period  Weeks    Status  On-going      PT LONG TERM GOAL #2   Title  5/5 left hip abduction strength to increase stability for functional tasks.    Time  8    Period  Weeks    Status  On-going   NT 06/08/18     PT LONG TERM GOAL #3   Title  Walk in clinic 500 feet with straight cane.    Time  8    Period  Weeks    Status  Achieved   06/08/18     PT LONG TERM GOAL #4   Title  Perform a reciprocating stair gait with one railing.    Time  8    Period  Weeks    Status  On-going   non reciprocating 05/29/18           Plan - 06/27/18 0949    Clinical Impression Statement  Patient tolerated today's treatment well as she was released from orthopedic MD. Patient still ambulating into clinic with FWW but reports being able to ambulate with cane as well as without an AD within her home. Increased resistance utilized during today's treatment with only complaints of fatigue reported by patient. Patient initially demonstrated non reciprical stair ambulation with one railing to the right. Patient denied any fear of falling although completed at a slower pace and more carefully. Patient encouraged with gait assesement for increased stride length which she was able to correct. RLE longer than LLE upon assessment  following  gait assessment as greater RLE R knee flexion. More hip abductor strengthening completed and provided in HEP for patient due to hip and pelvic weakness also noted in standing and gait assessment. Patient verbalized understanding of all HEP instructions.    Rehab Potential  Excellent    PT Frequency  2x / week    PT Duration  8 weeks    PT Treatment/Interventions  ADLs/Self Care Home Management;Cryotherapy;Electrical Stimulation;Ultrasound;Functional mobility training;Therapeutic activities;Therapeutic exercise;Neuromuscular re-education;Patient/family education;Manual techniques    PT Next Visit Plan  Finish visits with focus on hip abductor strengthening.    PT Home Exercise Plan  standing hip abd, gait with a straight cane short distances, hip flexion ( all standing exercises to be done with UE support); SL and standing hip abduction, supine clam    Consulted and Agree with Plan of Care  Patient       Patient will benefit from skilled therapeutic intervention in order to improve the following deficits and impairments:  Abnormal gait, Decreased activity tolerance, Decreased range of motion, Decreased strength, Pain  Visit Diagnosis: Pain in left hip  Muscle weakness (generalized)     Problem List Patient Active Problem List   Diagnosis Date Noted  . Type 2 diabetes mellitus with complication, without long-term current use of insulin (HCC) 02/15/2018  . Closed left hip fracture (HCC) 02/10/2018  . CKD (chronic kidney disease) stage 3, GFR 30-59 ml/min (HCC) 02/10/2018  . Controlled type 2 diabetes mellitus with hyperglycemia (HCC) 02/10/2018  . Hip fracture (HCC) 02/10/2018  . Pressure injury of skin 02/10/2018  . Chest pain 02/19/2016  . Arthritis of left knee 04/09/2014  . Status post total knee replacement 04/09/2014  . Arthropathy   . Diabetes (HCC)   . Hypertension   . Kidney stones   . Hypothyroidism   . Degenerative arthritis of knee 06/09/2012  . Flatulence,  eructation, and gas pain 12/24/2005  . Abdominal or pelvic swelling, mass, or lump, other specified site 12/24/2005  . Osteoporosis 12/04/2004  . Symptomatic menopausal or female climacteric states 12/04/2004  . Arthropathy, lower leg 04/21/2004  . Hematuria 09/17/2003  . Other long term (current) drug therapy 07/05/2002  . Hyperlipidemia, mixed 03/01/2000    Marvell Fuller, PTA 06/27/2018, 10:10 AM  [facility] [address] [place], [place], [address] Phone: [phone]   Fax:  [phone]  [name] MRN: [id number] Date of Birth: 03/18/1946

## 2018-06-29 ENCOUNTER — Ambulatory Visit: Payer: Medicare Other | Admitting: Physical Therapy

## 2018-06-29 ENCOUNTER — Other Ambulatory Visit: Payer: Self-pay | Admitting: *Deleted

## 2018-06-29 ENCOUNTER — Encounter: Payer: Self-pay | Admitting: Physical Therapy

## 2018-06-29 ENCOUNTER — Other Ambulatory Visit: Payer: Medicare Other

## 2018-06-29 DIAGNOSIS — Z1211 Encounter for screening for malignant neoplasm of colon: Secondary | ICD-10-CM | POA: Diagnosis not present

## 2018-06-29 DIAGNOSIS — M25552 Pain in left hip: Secondary | ICD-10-CM

## 2018-06-29 DIAGNOSIS — M6281 Muscle weakness (generalized): Secondary | ICD-10-CM | POA: Diagnosis not present

## 2018-06-29 NOTE — Therapy (Signed)
[facility] [address] [place], [place], [address] Phone: [phone]   Fax:  [phone]  Physical Therapy Treatment  Patient Details  [name] MRN: [id number] Date of Birth: 1946/05/04 Referring Provider: Rex Kras MD.   Encounter Date: 06/29/2018  PT End of Session - 06/29/18 0905    Visit Number  14    Number of Visits  16    Date for PT Re-Evaluation  08/06/18    PT Start Time  0904    PT Stop Time  0944    PT Time Calculation (min)  40 min    Activity Tolerance  Patient tolerated treatment well    Behavior During Therapy  [facility] for tasks assessed/performed       Past Medical History:  Diagnosis Date  . Arthritis    OA AND PAIN BOTH KNEES -RIGHT KNEE HURTS WORSE THAN LEFT  . Diabetes mellitus    ORAL MEDICATION - NO INSULIN  . Hypertension   . Hypothyroidism   . Kidney stones    NONE AT PRESENT TIME THAT PT IS AWARE OF    Past Surgical History:  Procedure Laterality Date  . ABDOMINAL HYSTERECTOMY    . CHOLECYSTECTOMY  1972  . COLONOSCOPY    . EYE SURGERY     RIGHT CATARACT EXTRACTON WITH LENS IMPLANT  . GROWTH REMOVED FROM THYROID    . INTRAMEDULLARY (IM) NAIL INTERTROCHANTERIC Left 02/10/2018   Procedure: INTRAMEDULLARY (IM) NAIL INTERTROCHANTRIC;  Surgeon: Kathryne Hitch, MD;  Location: [facility] OR;  Service: Orthopedics;  Laterality: Left;  . PERCUTANEOUS NEPHROLITHOTOMY    . TOTAL KNEE ARTHROPLASTY  06/09/2012   Procedure: TOTAL KNEE ARTHROPLASTY;  Surgeon: Kathryne Hitch, MD;  Location: [facility];  Service: Orthopedics;  Laterality: Right;  Right Total Knee Arthroplasty  . TOTAL KNEE ARTHROPLASTY Left 04/09/2014   Procedure: LEFT TOTAL KNEE ARTHROPLASTY;  Surgeon: Kathryne Hitch, MD;  Location: [facility] OR;  Service: Orthopedics;  Laterality: Left;  . TUBAL LIGATION      There were no vitals filed for this visit.  Subjective Assessment - 06/29/18 0905    Subjective  Reports some  soreness from the SL exercises.    Pertinent History  DM; OP.    How long can you walk comfortably?  Short distances with walker.    Patient Stated Goals  See above.    Currently in Pain?  Yes    Pain Score  4     Pain Location  Hip    Pain Orientation  Left    Pain Descriptors / Indicators  Sore    Pain Type  Acute pain    Pain Onset  More than a month ago         [facility] PT Assessment - 06/29/18 0001      Assessment   Medical Diagnosis  Left hip fracture; Balance disorder.    Onset Date/Surgical Date  02/10/18    Next MD Visit  None      Precautions   Precautions  Fall      Restrictions   Weight Bearing Restrictions  No                   [facility] Adult PT Treatment/Exercise - 06/29/18 0001      Exercises   Exercises  Knee/Hip      Knee/Hip Exercises: Aerobic   Nustep  L6 x14 min      Knee/Hip Exercises: Machines for Strengthening   Cybex Knee  Extension  20# 2x10 reps    Cybex Knee Flexion  40# 2x10 reps      Knee/Hip Exercises: Standing   Heel Raises  Both;20 reps    Heel Raises Limitations  B toe raise x20 reps    Hip Abduction  AROM;Both;2 sets;10 reps;Knee straight    Lateral Step Up  Left;2 sets;10 reps;Step Height: 6"    Forward Step Up  Left;2 sets;10 reps;Hand Hold: 2;Step Height: 6"    Wall Squat  15 reps      Knee/Hip Exercises: Supine   Straight Leg Raises  AROM;Both;2 sets;10 reps    Other Supine Knee/Hip Exercises  B clam red theraband 2x10 reps               PT Short Term Goals - 05/08/18 1151      PT SHORT TERM GOAL #1   Title  STG's=LTG's.        PT Long Term Goals - 06/08/18 [id number]      PT LONG TERM GOAL #1   Title  Ind with an HEP.    Time  8    Period  Weeks    Status  On-going      PT LONG TERM GOAL #2   Title  5/5 left hip abduction strength to increase stability for functional tasks.    Time  8    Period  Weeks    Status  On-going   NT 06/08/18     PT LONG TERM GOAL #3   Title  Walk in clinic 500 feet  with straight cane.    Time  8    Period  Weeks    Status  Achieved   06/08/18     PT LONG TERM GOAL #4   Title  Perform a reciprocating stair gait with one railing.    Time  8    Period  Weeks    Status  On-going   non reciprocating 05/29/18           Plan - 06/29/18 0952    Clinical Impression Statement  Patient tolerated today's treatment other than being sore especially with SL on L hip and from new strengthening exercises. Patient still ambulating into therapy clinic with FWW. Only soreness and fatigue reported by patient during today's therex session. Minimal to moderate VCs and demo required to ensure proper technique of exercises.    Rehab Potential  Excellent    PT Frequency  2x / week    PT Duration  8 weeks    PT Treatment/Interventions  ADLs/Self Care Home Management;Cryotherapy;Electrical Stimulation;Ultrasound;Functional mobility training;Therapeutic activities;Therapeutic exercise;Neuromuscular re-education;Patient/family education;Manual techniques    PT Next Visit Plan  Finish visits with focus on hip abductor strengthening.    PT Home Exercise Plan  standing hip abd, gait with a straight cane short distances, hip flexion ( all standing exercises to be done with UE support); SL and standing hip abduction, supine clam    Consulted and Agree with Plan of Care  Patient       Patient will benefit from skilled therapeutic intervention in order to improve the following deficits and impairments:  Abnormal gait, Decreased activity tolerance, Decreased range of motion, Decreased strength, Pain  Visit Diagnosis: Pain in left hip  Muscle weakness (generalized)     Problem List Patient Active Problem List   Diagnosis Date Noted  . Type 2 diabetes mellitus with complication, without long-term current use of insulin (HCC) 02/15/2018  . Closed left hip fracture (HCC)  02/10/2018  . CKD (chronic kidney disease) stage 3, GFR 30-59 ml/min (HCC) 02/10/2018  . Controlled  type 2 diabetes mellitus with hyperglycemia (HCC) 02/10/2018  . Hip fracture (HCC) 02/10/2018  . Pressure injury of skin 02/10/2018  . Chest pain 02/19/2016  . Arthritis of left knee 04/09/2014  . Status post total knee replacement 04/09/2014  . Arthropathy   . Diabetes (HCC)   . Hypertension   . Kidney stones   . Hypothyroidism   . Degenerative arthritis of knee 06/09/2012  . Flatulence, eructation, and gas pain 12/24/2005  . Abdominal or pelvic swelling, mass, or lump, other specified site 12/24/2005  . Osteoporosis 12/04/2004  . Symptomatic menopausal or female climacteric states 12/04/2004  . Arthropathy, lower leg 04/21/2004  . Hematuria 09/17/2003  . Other long term (current) drug therapy 07/05/2002  . Hyperlipidemia, mixed 03/01/2000    Marvell Fuller, PTA 06/29/2018, 9:55 AM  [facility] [address] [place], [place], [address] Phone: [phone]   Fax:  [phone]  [name] MRN: [id number] Date of Birth: March 15, 1946

## 2018-06-30 LAB — FECAL OCCULT BLOOD, IMMUNOCHEMICAL: FECAL OCCULT BLD: NEGATIVE

## 2018-07-04 ENCOUNTER — Ambulatory Visit: Payer: Medicare Other | Admitting: Physical Therapy

## 2018-07-04 ENCOUNTER — Encounter: Payer: Self-pay | Admitting: Physical Therapy

## 2018-07-04 DIAGNOSIS — M6281 Muscle weakness (generalized): Secondary | ICD-10-CM | POA: Diagnosis not present

## 2018-07-04 DIAGNOSIS — M25552 Pain in left hip: Secondary | ICD-10-CM

## 2018-07-04 NOTE — Therapy (Addendum)
[facility] [address] [place], [place], [address] Phone: [phone]   Fax:  [phone]  Physical Therapy Treatment  Patient Details  Name: DEMI TRIEU MRN: [id number] Date of Birth: Jun 25, 1946 Referring Provider: Rex Kras MD.   Encounter Date: 07/04/2018  PT End of Session - 07/04/18 1021    Visit Number  15    Number of Visits  16    Date for PT Re-Evaluation  08/06/18    PT Start Time  0900    PT Stop Time  0946    PT Time Calculation (min)  46 min    Activity Tolerance  Patient tolerated treatment well    Behavior During Therapy  [facility] for tasks assessed/performed       Past Medical History:  Diagnosis Date  . Arthritis    OA AND PAIN BOTH KNEES -RIGHT KNEE HURTS WORSE THAN LEFT  . Diabetes mellitus    ORAL MEDICATION - NO INSULIN  . Hypertension   . Hypothyroidism   . Kidney stones    NONE AT PRESENT TIME THAT PT IS AWARE OF    Past Surgical History:  Procedure Laterality Date  . ABDOMINAL HYSTERECTOMY    . CHOLECYSTECTOMY  1972  . COLONOSCOPY    . EYE SURGERY     RIGHT CATARACT EXTRACTON WITH LENS IMPLANT  . GROWTH REMOVED FROM THYROID    . INTRAMEDULLARY (IM) NAIL INTERTROCHANTERIC Left 02/10/2018   Procedure: INTRAMEDULLARY (IM) NAIL INTERTROCHANTRIC;  Surgeon: Kathryne Hitch, MD;  [facility];  Service: Orthopedics;  Laterality: Left;  . PERCUTANEOUS NEPHROLITHOTOMY    . TOTAL KNEE ARTHROPLASTY  06/09/2012   Procedure: TOTAL KNEE ARTHROPLASTY;  Surgeon: Kathryne Hitch, MD;  [facility]: [facility];  Service: Orthopedics;  Laterality: Right;  Right Total Knee Arthroplasty  . TOTAL KNEE ARTHROPLASTY Left 04/09/2014   Procedure: LEFT TOTAL KNEE ARTHROPLASTY;  Surgeon: Kathryne Hitch, MD;  Location: [facility] OR;  Service: Orthopedics;  Laterality: Left;  . TUBAL LIGATION      There were no vitals filed for this visit.  Subjective Assessment - 07/04/18 0921    Subjective  Dr. was very  pleaed.  Not using my walker at home.  no pain today.    How long can you walk comfortably?  Short distances with walker.    Patient Stated Goals  See above.    Currently in Pain?  No/denies                       [facility] Adult PT Treatment/Exercise - 07/04/18 0001      Ambulation/Gait   Gait Comments  Gait training with straight cane on right.  Walked up and down stairs with one railing, on uneven terrain over lawn, up and down curbs.      Exercises   Exercises  Knee/Hip      Knee/Hip Exercises: Aerobic   Nustep  Level 5 x 22 minutes.      Knee/Hip Exercises: Machines for Strengthening   Cybex Knee Extension  10# x 4 minutes.    Cybex Knee Flexion  30# x 4 minutes.               PT Short Term Goals - 05/08/18 1151      PT SHORT TERM GOAL #1   Title  STG's=LTG's.        PT Long Term Goals - 07/04/18 1012      PT LONG TERM GOAL #1  Title  Ind with an HEP.    Time  8    Period  Weeks    Status  Achieved      PT LONG TERM GOAL #3   Title  Walk in clinic 500 feet with straight cane.    Time  8    Period  Weeks    Status  Achieved      PT LONG TERM GOAL #4   Title  Perform a reciprocating stair gait with one railing.    Time  8    Period  Weeks    Status  Achieved            Plan - 07/04/18 1010    Clinical Impression Statement  Patient did a great job today with gait training wiht straight cane.  She needed cues for sequence of gait pattern up/down stairs.  Her gait was slow and cautious with a decrease step/stride length.  No lose of balance.    Rehab Potential  Excellent    PT Frequency  2x / week    PT Treatment/Interventions  ADLs/Self Care Home Management;Cryotherapy;Electrical Stimulation;Ultrasound;Functional mobility training;Therapeutic activities;Therapeutic exercise;Neuromuscular re-education;Patient/family education;Manual techniques    PT Next Visit Plan  D/c next visit.  Goal assessment.  Finish visits with focus on hip  abductor strengthening.    PT Home Exercise Plan  standing hip abd, gait with a straight cane short distances, hip flexion ( all standing exercises to be done with UE support); SL and standing hip abduction, supine clam    Consulted and Agree with Plan of Care  Patient       Patient will benefit from skilled therapeutic intervention in order to improve the following deficits and impairments:  Abnormal gait, Decreased activity tolerance, Decreased range of motion, Decreased strength, Pain  Visit Diagnosis: Pain in left hip  Muscle weakness (generalized)     Problem List Patient Active Problem List   Diagnosis Date Noted  . Type 2 diabetes mellitus with complication, without long-term current use of insulin (HCC) 02/15/2018  . Closed left hip fracture (HCC) 02/10/2018  . CKD (chronic kidney disease) stage 3, GFR 30-59 ml/min (HCC) 02/10/2018  . Controlled type 2 diabetes mellitus with hyperglycemia (HCC) 02/10/2018  . Hip fracture (HCC) 02/10/2018  . Pressure injury of skin 02/10/2018  . Chest pain 02/19/2016  . Arthritis of left knee 04/09/2014  . Status post total knee replacement 04/09/2014  . Arthropathy   . Diabetes (HCC)   . Hypertension   . Kidney stones   . Hypothyroidism   . Degenerative arthritis of knee 06/09/2012  . Flatulence, eructation, and gas pain 12/24/2005  . Abdominal or pelvic swelling, mass, or lump, other specified site 12/24/2005  . Osteoporosis 12/04/2004  . Symptomatic menopausal or female climacteric states 12/04/2004  . Arthropathy, lower leg 04/21/2004  . Hematuria 09/17/2003  . Other long term (current) drug therapy 07/05/2002  . Hyperlipidemia, mixed 03/01/2000    APPLEGATE, [place]HAD MPT 07/04/2018, 10:22 AM  [facility] [address] [place], [place], [address] Phone: [phone]   Fax:  [phone]  Name: Jan FiremanMarie S Petrizzo MRN: [id number] Date of Birth: 12-15-1945

## 2018-07-06 ENCOUNTER — Ambulatory Visit: Payer: Medicare Other | Admitting: Physical Therapy

## 2018-07-06 ENCOUNTER — Encounter: Payer: Self-pay | Admitting: Physical Therapy

## 2018-07-06 DIAGNOSIS — M25552 Pain in left hip: Secondary | ICD-10-CM | POA: Diagnosis not present

## 2018-07-06 DIAGNOSIS — M6281 Muscle weakness (generalized): Secondary | ICD-10-CM | POA: Diagnosis not present

## 2018-07-06 NOTE — Therapy (Signed)
[facility] [place], [place], [address] Phone: [phone]   Fax:  [phone]  Physical Therapy Treatment  Patient Details  [name] MRN: [id number] Date of Birth: 05/19/1946 Referring Provider: Assunta Found MD.   Encounter Date: 07/06/2018  PT End of Session - 07/06/18 0940    Visit Number  16    Number of Visits  16    Date for PT Re-Evaluation  08/06/18    PT Start Time  0901    PT Stop Time  0942    PT Time Calculation (min)  41 min    Activity Tolerance  Patient tolerated treatment well    Behavior During Therapy  [facility] for tasks assessed/performed       Past Medical History:  Diagnosis Date  . Arthritis    OA AND PAIN BOTH KNEES -RIGHT KNEE HURTS WORSE THAN LEFT  . Diabetes mellitus    ORAL MEDICATION - NO INSULIN  . Hypertension   . Hypothyroidism   . Kidney stones    NONE AT PRESENT TIME THAT PT IS AWARE OF    Past Surgical History:  Procedure Laterality Date  . ABDOMINAL HYSTERECTOMY    . CHOLECYSTECTOMY  1972  . COLONOSCOPY    . EYE SURGERY     RIGHT CATARACT EXTRACTON WITH LENS IMPLANT  . GROWTH REMOVED FROM THYROID    . INTRAMEDULLARY (IM) NAIL INTERTROCHANTERIC Left 02/10/2018   Procedure: INTRAMEDULLARY (IM) NAIL INTERTROCHANTRIC;  Surgeon: Mcarthur Rossetti, MD;  Location: [place];  Service: Orthopedics;  Laterality: Left;  . PERCUTANEOUS NEPHROLITHOTOMY    . TOTAL KNEE ARTHROPLASTY  06/09/2012   Procedure: TOTAL KNEE ARTHROPLASTY;  Surgeon: Mcarthur Rossetti, MD;  [facility]: [facility];  Service: Orthopedics;  Laterality: Right;  Right Total Knee Arthroplasty  . TOTAL KNEE ARTHROPLASTY Left 04/09/2014   Procedure: LEFT TOTAL KNEE ARTHROPLASTY;  Surgeon: Mcarthur Rossetti, MD;  Location: [place];  Service: Orthopedics;  Laterality: Left;  . TUBAL LIGATION      There were no vitals filed for this visit.  Subjective Assessment - 07/06/18 0908    Subjective  No pain and  doing well overall per reported upon arrival.    Pertinent History  DM; OP.    How long can you walk comfortably?  Short distances with walker.         [facility] PT Assessment - 07/06/18 0001      ROM / Strength   AROM / PROM / Strength  Strength      Strength   Strength Assessment Site  Hip    Right/Left Hip  Left    Left Hip Flexion  4+/5                   [facility] Adult PT Treatment/Exercise - 07/06/18 0001      Knee/Hip Exercises: Aerobic   Nustep  47mn L5 UE/LE, monitored for progression      Knee/Hip Exercises: Machines for Strengthening   Cybex Knee Extension  10# 2x20    Cybex Knee Flexion  30# 2x20      Knee/Hip Exercises: Standing   Hip Abduction  Stengthening;Left;2 sets;10 reps;Knee straight;Limitations    Abduction Limitations  red/yellow t-band    Lateral Step Up  Left;2 sets;10 reps;Step Height: 8"    Forward Step Up  Left;2 sets;10 reps;Step Height: 8"      Knee/Hip Exercises: Supine   Other Supine Knee/Hip Exercises  B clam red theraband x20/ LT  x20      Knee/Hip Exercises: Sidelying   Hip ABduction  Strengthening;Left;2 sets;10 reps               PT Short Term Goals - 05/08/18 1151      PT SHORT TERM GOAL #1   Title  STG's=LTG's.        PT Long Term Goals - 07/06/18 5885      PT LONG TERM GOAL #1   Title  Ind with an HEP.    Time  8    Period  Weeks    Status  Achieved      PT LONG TERM GOAL #2   Title  5/5 left hip abduction strength to increase stability for functional tasks.    Time  8    Period  Weeks    Status  Not Met   4+/5 07/06/18     PT LONG TERM GOAL #3   Title  Walk in clinic 500 feet with straight cane.    Time  8    Period  Weeks    Status  Achieved      PT LONG TERM GOAL #4   Title  Perform a reciprocating stair gait with one railing.    Time  8    Period  Weeks    Status  Achieved            Plan - 07/06/18 0943    Clinical Impression Statement  Patient met all current goals except  strength goal. Patient improved strength abd to 4+/5. Patient independent with HEP.DC today.     Rehab Potential  Excellent    PT Frequency  2x / week    PT Duration  8 weeks    PT Treatment/Interventions  ADLs/Self Care Home Management;Cryotherapy;Electrical Stimulation;Ultrasound;Functional mobility training;Therapeutic activities;Therapeutic exercise;Neuromuscular re-education;Patient/family education;Manual techniques    PT Next Visit Plan  DC    Consulted and Agree with Plan of Care  Patient       Patient will benefit from skilled therapeutic intervention in order to improve the following deficits and impairments:  Abnormal gait, Decreased activity tolerance, Decreased range of motion, Decreased strength, Pain  Visit Diagnosis: Pain in left hip  Muscle weakness (generalized)     Problem List Patient Active Problem List   Diagnosis Date Noted  . Type 2 diabetes mellitus with complication, without long-term current use of insulin ([place]) 02/15/2018  . Closed left hip fracture ([place]) 02/10/2018  . CKD (chronic kidney disease) stage 3, GFR 30-59 ml/min (HCC) 02/10/2018  . Controlled type 2 diabetes mellitus with hyperglycemia ([place]) 02/10/2018  . Hip fracture ([place]) 02/10/2018  . Pressure injury of skin 02/10/2018  . Chest pain 02/19/2016  . Arthritis of left knee 04/09/2014  . Status post total knee replacement 04/09/2014  . Arthropathy   . Diabetes ([place])   . Hypertension   . Kidney stones   . Hypothyroidism   . Degenerative arthritis of knee 06/09/2012  . Flatulence, eructation, and gas pain 12/24/2005  . Abdominal or pelvic swelling, mass, or lump, other specified site 12/24/2005  . Osteoporosis 12/04/2004  . Symptomatic menopausal or female climacteric states 12/04/2004  . Arthropathy, lower leg 04/21/2004  . Hematuria 09/17/2003  . Other long term (current) drug therapy 07/05/2002  . Hyperlipidemia, mixed 03/01/2000    Ladean Raya, PTA 07/06/18 9:46 AM  [facility] [address] [place], [place], [address] Phone: [phone]   Fax:  [phone]  [name]  MRN: [id number] Date of Birth: 1946/01/18  PHYSICAL THERAPY DISCHARGE SUMMARY  Visits from Start of Care: 16.  Current functional level related to goals / functional outcomes: See above.   Remaining deficits: All but LTG #3 unmet.   Education / Equipment: HEP. Plan: Patient agrees to discharge.  Patient goals were partially met. Patient is being discharged due to being pleased with the current functional level.  ?????         Mali Applegate MPT

## 2018-07-12 DIAGNOSIS — M25552 Pain in left hip: Secondary | ICD-10-CM | POA: Diagnosis not present

## 2018-07-12 DIAGNOSIS — M8000XA Age-related osteoporosis with current pathological fracture, unspecified site, initial encounter for fracture: Secondary | ICD-10-CM | POA: Diagnosis not present

## 2018-07-24 ENCOUNTER — Encounter: Payer: Self-pay | Admitting: Pediatrics

## 2018-07-24 DIAGNOSIS — Z1231 Encounter for screening mammogram for malignant neoplasm of breast: Secondary | ICD-10-CM

## 2018-07-24 NOTE — Telephone Encounter (Signed)
I called daughter and she would like a referral to [facility] at [facility]. Referral placed.

## 2018-08-08 ENCOUNTER — Ambulatory Visit (INDEPENDENT_AMBULATORY_CARE_PROVIDER_SITE_OTHER): Payer: Medicare Other | Admitting: *Deleted

## 2018-08-08 VITALS — BP 167/77 | HR 56 | Temp 97.3°F | Ht 67.0 in | Wt 178.0 lb

## 2018-08-08 DIAGNOSIS — Z23 Encounter for immunization: Secondary | ICD-10-CM | POA: Diagnosis not present

## 2018-08-08 DIAGNOSIS — Z0000 Encounter for general adult medical examination without abnormal findings: Secondary | ICD-10-CM

## 2018-08-08 NOTE — Patient Instructions (Signed)
  Meredith Shaw , Thank you for taking time to come for your Medicare Wellness Visit. I appreciate your ongoing commitment to your health goals. Please review the following plan we discussed and let me know if I can assist you in the future.   These are the goals we discussed: Goals    . Exercise 3x per week (30 min per time)     Continue to walk to for at least 30 minutes daily. Try to increase to 45 minutes if as tolerated.    . Prevent falls     Recent fall = get all strength back and walk without a cane.        This is a list of the screening recommended for you and due dates:  Health Maintenance  Topic Date Due  . Eye exam for diabetics  12/22/2016  . Mammogram  02/01/2018  . Flu Shot  05/11/2018  . Colon Cancer Screening  06/30/2019*  . Hemoglobin A1C  12/07/2018  . Tetanus Vaccine  02/09/2019  . Complete foot exam   06/07/2019  . Stool Blood Test  06/30/2019  . DEXA scan (bone density measurement)  Completed  .  Hepatitis C: One time screening is recommended by Center for Disease Control  (CDC) for  adults born from [birth year] through 1965.   Completed  . Pneumonia vaccines  Completed  *Topic was postponed. The date shown is not the original due date.    Keep follow up with Dr Oswaldo Done and other specialist

## 2018-08-08 NOTE — Progress Notes (Addendum)
Subjective:   Meredith Shaw is a 72 y.o. female who presents for Medicare Annual (Subsequent) preventive examination. 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 She enjoys spending time with her family and cross-stitching. For exercise, she has been doing PT twice a week for 30 mins each time. She is regaining strength from a recent fall and left hip surgery. She states that her diet is healthy and she typically eats 3 meals a day. She is active in church and she lives at home with her husband, Berneta Sages. They have 2 children and 4 grandchildren. She does not currently have any pets and fall risks were discussed today. She states that her health is about the same as it was a year ago.    Cardiac Risk Factors include: advanced age (>41mn, >>23women);diabetes mellitus;hypertension     Objective:     Vitals: BP (!) 167/77   Pulse (!) 56   Temp (!) 97.3 F (36.3 C) (Oral)   Ht 5' 7"  (1.702 m)   Wt 178 lb (80.7 kg)   BMI 27.88 kg/m   Body mass index is 27.88 kg/m.  Advanced Directives 08/08/2018 05/23/2018 02/10/2018 03/16/2017 02/14/2016 04/11/2014 04/02/2014  Does Patient Have a Medical Advance Directive? Yes No Yes Yes No Patient has advance directive, copy not in chart Patient has advance directive, copy not in chart  Type of Advance [place]ive [place] Living will Living will  Does patient want to make changes to medical advance directive? No - Patient declined - No - Patient declined No - Patient declined - No change requested No change requested  Copy of [place] Chart? No - copy requested - - No - copy requested - Copy requested from family Copy requested from family  Would patient like information on creating a medical advance directive? - No - Patient declined - - - - -  Pre-existing out of facility DNR order (yellow form or pink MOST form) - - - - - No -      Tobacco Social History   Tobacco Use  Smoking Status Never Smoker  Smokeless Tobacco Never Used     Counseling given: Not Answered   Past Medical History:  Diagnosis Date  . Arthritis    OA AND PAIN BOTH KNEES -RIGHT KNEE HURTS WORSE THAN LEFT  . Cataract   . Diabetes mellitus    ORAL MEDICATION - NO INSULIN  . Hypertension   . Hypothyroidism   . Kidney stones    NONE AT PRESENT TIME THAT PT IS AWARE OF  . Osteoporosis    Past Surgical History:  Procedure Laterality Date  . ABDOMINAL HYSTERECTOMY    . CHOLECYSTECTOMY  1972  . COLONOSCOPY    . EYE SURGERY     RIGHT CATARACT EXTRACTON WITH LENS IMPLANT  . GROWTH REMOVED FROM THYROID    . INTRAMEDULLARY (IM) NAIL INTERTROCHANTERIC Left 02/10/2018   Procedure: INTRAMEDULLARY (IM) NAIL INTERTROCHANTRIC;  Surgeon: BMcarthur Rossetti MD;  Location: [facility]  Service: Orthopedics;  Laterality: Left;  . left hip surgery   02/2018  . PERCUTANEOUS NEPHROLITHOTOMY    . TOTAL KNEE ARTHROPLASTY  06/09/2012   Procedure: TOTAL KNEE ARTHROPLASTY;  Surgeon: CMcarthur Rossetti MD;  Location: [facility];  Service: Orthopedics;  Laterality: Right;  Right Total Knee Arthroplasty  . TOTAL KNEE ARTHROPLASTY Left 04/09/2014   Procedure: LEFT TOTAL KNEE  ARTHROPLASTY;  Surgeon: Mcarthur Rossetti, MD;  Location: [facility];  Service: Orthopedics;  Laterality: Left;  . TUBAL LIGATION     Family History  Problem Relation Age of Onset  . Heart disease Mother   . Stroke Father   . Heart disease Father   . Healthy Daughter   . Healthy Son   . Liver disease Maternal Grandmother        gall stones imbedded in liver - blead to death when in surgery   . Pneumonia Maternal Grandfather   . Diabetes Paternal Grandmother    Social History   Socioeconomic History  . Marital status: Married    Spouse name: Berneta Sages   . Number of children: 2  . Years of education: Not on file  . Highest education level: Not on file  Occupational History  .  Occupation: retired     Fish farm manager: [employer]    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    Comment: bank - 48yr  Social Needs  . Financial resource strain: Not on file  . Food insecurity:    Worry: Not on file    Inability: Not on file  . Transportation needs:    Medical: Not on file    Non-medical: Not on file  Tobacco Use  . Smoking status: Never Smoker  . Smokeless tobacco: Never Used  Substance and Sexual Activity  . Alcohol use: No  . Drug use: No  . Sexual activity: Yes  Lifestyle  . Physical activity:    Days per week: Not on file    Minutes per session: Not on file  . Stress: Not on file  Relationships  . Social connections:    Talks on phone: Not on file    Gets together: Not on file    Attends religious service: Not on file    Active member of club or organization: Not on file    Attends meetings of clubs or organizations: Not on file    Relationship status: Not on file  Other Topics Concern  . Not on file  Social History Narrative  . Not on file    Outpatient Encounter Medications as of 08/08/2018  Medication Sig  . amLODipine (NORVASC) 5 MG tablet Take 1 tablet (5 mg total) by mouth daily.  .Marland Kitchenaspirin 81 MG chewable tablet Chew by mouth daily.  . Blood Glucose Monitoring Suppl (ONETOUCH VERIO) w/Device KIT 1 each by Does not apply route daily.  . cholecalciferol (VITAMIN D) 1000 units tablet Take 1,000 Units by mouth daily.  .Marland Kitchenglucose blood (ONETOUCH VERIO) test strip Use as instructed  . Lancets (ONETOUCH ULTRASOFT) lancets Use as instructed  . levothyroxine (SYNTHROID, LEVOTHROID) 137 MCG tablet TAKE ONE (1) TABLET EACH DAY  . lisinopril (PRINIVIL,ZESTRIL) 10 MG tablet Take 1 tablet (10 mg total) by mouth daily.  . metFORMIN (GLUCOPHAGE-XR) 500 MG 24 hr tablet Take 1 tablet (500 mg total) by mouth 2 (two) times daily.  . metoprolol tartrate (LOPRESSOR) 100 MG tablet Take 1 tablet (100 mg total) by mouth 2 (two) times daily.  . pravastatin (PRAVACHOL) 40 MG tablet Take 1  tablet (40 mg total) by mouth at bedtime.  .Marland Kitchenibuprofen (ADVIL,MOTRIN) 800 MG tablet 1 po bid with food  . [DISCONTINUED] calcium carbonate (CALCIUM 600) 1500 (600 Ca) MG TABS tablet three po qd  . [DISCONTINUED] Multiple Vitamin (MULTIVITAMIN WITH MINERALS) TABS tablet Take 1 tablet by mouth daily.   No facility-administered encounter medications on file as of 08/08/2018.  Activities of Daily Living In your present state of health, do you have any difficulty performing the following activities: 08/08/2018 02/10/2018  Hearing? N N  Vision? Y N  Comment wears Rx glasses  -  Difficulty concentrating or making decisions? N N  Walking or climbing stairs? N Y  Dressing or bathing? N Y  Doing errands, shopping? N Y  Conservation officer, nature and eating ? N -  Using the Toilet? N -  In the past six months, have you accidently leaked urine? N -  Do you have problems with loss of bowel control? N -  Managing your Medications? N -  Managing your Finances? N -  Housekeeping or managing your Housekeeping? N -  Some recent data might be hidden    Patient Care Team: Eustaquio Maize, MD as PCP - General (Pediatrics) Mcarthur Rossetti, MD as Consulting Physician (Orthopedic Surgery) Rana Snare, MD as Consulting Physician (Urology) Corey Harold, MD as Consulting Physician    Assessment:   This is a routine wellness examination for Ragan.  Exercise Activities and Dietary recommendations Current Exercise Habits: Home exercise routine, Type of exercise: Other - see comments(PT and rehab ), Time (Minutes): 30, Frequency (Times/Week): 2, Weekly Exercise (Minutes/Week): 60, Exercise limited by: orthopedic condition(s)  Goals    . Exercise 3x per week (30 min per time)     Continue to walk to for at least 30 minutes daily. Try to increase to 45 minutes if as tolerated.    . Prevent falls     Recent fall = get all strength back and walk without a cane.        Fall Risk Fall Risk  08/08/2018  06/06/2018 04/05/2018 03/10/2018 12/31/2017  Falls in the past year? Yes Yes Yes No No  Number falls in past yr: 1 2 or more 1 - -  Injury with Fall? Yes Yes Yes - -  Risk for fall due to : - History of fall(s);Impaired balance/gait History of fall(s);Impaired balance/gait;Impaired mobility - -  Follow up - Falls evaluation completed;Education provided - - -   Is the patient's home free of loose throw rugs in walkways, pet beds, electrical cords, etc?   Fall risks and hazards were discussed today  Depression Screen PHQ 2/9 Scores 08/08/2018 06/06/2018 04/05/2018 03/10/2018  PHQ - 2 Score 0 0 0 0  PHQ- 9 Score - - - -     Cognitive Function MMSE - Mini Mental State Exam 08/08/2018 03/16/2017  Orientation to time 5 5  Orientation to Place 5 5  Registration 3 3  Attention/ Calculation 4 5  Recall 3 3  Language- name 2 objects 2 2  Language- repeat 1 1  Language- follow 3 step command 3 3  Language- read & follow direction 1 1  Write a sentence 1 1  Copy design 1 1  Total score 29 30        Immunization History  Administered Date(s) Administered  . DT 12/04/2004  . Influenza, High Dose Seasonal PF 08/26/2016, 08/16/2017  . Influenza,inj,Quad PF,6+ Mos 08/28/2015  . Influenza-Unspecified 12/04/2004  . Pneumococcal Conjugate-13 12/04/2004, 12/17/2014  . Pneumococcal Polysaccharide-23 07/05/2017    Qualifies for Shingles Vaccine? Declined   Screening Tests Health Maintenance  Topic Date Due  . OPHTHALMOLOGY EXAM  12/22/2016  . MAMMOGRAM  02/01/2018  . INFLUENZA VACCINE  05/11/2018  . COLONOSCOPY  06/30/2019 (Originally 06/11/2016)  . HEMOGLOBIN A1C  12/07/2018  . TETANUS/TDAP  02/09/2019  . FOOT EXAM  06/07/2019  . COLON CANCER SCREENING ANNUAL FOBT  06/30/2019  . DEXA SCAN  Completed  . Hepatitis C Screening  Completed  . PNA vac Low Risk Adult  Completed    Cancer Screenings: Lung: Low Dose CT Chest recommended if Age 7-80 years, 30 pack-year currently smoking OR have  quit w/in 15years. Patient does not qualify. Breast:  Up to date on Mammogram? Yes   Up to date of Bone Density/Dexa? Yes Colorectal: done = cologaurd  Additional Screenings: declined  Hepatitis C Screening:      Plan:   pt is to keep follow up with Dr Evette Doffing and other specialist Flu shot given today  I have personally reviewed and noted the following in the patient's chart:   . Medical and social history . Use of alcohol, tobacco or illicit drugs  . Current medications and supplements . Functional ability and status . Nutritional status . Physical activity . Advanced directives . List of other physicians . Hospitalizations, surgeries, and ER visits in previous 12 months . Vitals . Screenings to include cognitive, depression, and falls . Referrals and appointments  In addition, I have reviewed and discussed with patient certain preventive protocols, quality metrics, and best practice recommendations. A written personalized care plan for preventive services as well as general preventive health recommendations were provided to patient.     Bullins, Cameron Proud, LPN  [id number]   I have reviewed and agree with the above AWV documentation.  Claretta Fraise, M.D.

## 2018-08-09 ENCOUNTER — Ambulatory Visit (INDEPENDENT_AMBULATORY_CARE_PROVIDER_SITE_OTHER): Payer: Medicare Other | Admitting: Pediatrics

## 2018-08-09 ENCOUNTER — Encounter: Payer: Self-pay | Admitting: Pediatrics

## 2018-08-09 VITALS — BP 136/80 | HR 60 | Temp 97.9°F | Ht 67.0 in | Wt 172.0 lb

## 2018-08-09 DIAGNOSIS — I10 Essential (primary) hypertension: Secondary | ICD-10-CM | POA: Diagnosis not present

## 2018-08-09 DIAGNOSIS — E119 Type 2 diabetes mellitus without complications: Secondary | ICD-10-CM | POA: Diagnosis not present

## 2018-08-09 DIAGNOSIS — M8000XD Age-related osteoporosis with current pathological fracture, unspecified site, subsequent encounter for fracture with routine healing: Secondary | ICD-10-CM

## 2018-08-09 MED ORDER — LISINOPRIL 10 MG PO TABS: 10.0000 mg | ORAL_TABLET | Freq: Every day | ORAL | 1 refills | Status: DC

## 2018-08-09 MED ORDER — METFORMIN HCL ER 500 MG PO TB24: 500.0000 mg | ORAL_TABLET | Freq: Two times a day (BID) | ORAL | 1 refills | Status: DC

## 2018-08-09 NOTE — Progress Notes (Signed)
  Subjective:   Patient ID: [name], female    DOB: Aug 09, 1946, 73 y.o.   MRN: [id number] CC: Medical Management of Chronic Issues  HPI: ARIELLA VOIT is a 72 y.o. female   Osteoporosis: Has high out-of-pocket cost, $240.  Has not yet applied program to see if she would be eligible.  With CKD, would like to avoid bisphosphonate.  Diabetes: Taking metformin regularly.  Avoiding sugary foods.  Hypertension: Taking medicine regularly.  No lightheadedness or dizziness on current dosing.  No chest pain with exertion.  Happens with physical therapy from recent hip fracture.  Says she has been feeling stronger, gait more stable.  Relevant past medical, surgical, family and social history reviewed. Allergies and medications reviewed and updated. Social History   Tobacco Use  Smoking Status Never Smoker  Smokeless Tobacco Never Used   ROS: Per HPI   Objective:    BP 136/80   Pulse 60   Temp 97.9 F (36.6 C) (Oral)   Ht 5\' 7"  (1.702 m)   Wt 172 lb (78 kg)   BMI 26.94 kg/m   Wt Readings from Last 3 Encounters:  08/09/18 172 lb (78 kg)  08/08/18 178 lb (80.7 kg)  06/06/18 171 lb 12.8 oz (77.9 kg)    Gen: NAD, alert, cooperative with exam, NCAT EYES: EOMI, no conjunctival injection, or no icterus CV: NRRR, normal S1/S2, no murmur, distal pulses 2+ b/l Resp: CTABL, no wheezes, normal WOB Abd: +BS, soft, NTND. no guarding or organomegaly Ext: No edema, warm Neuro: Alert and oriented, strength equal b/l UE and LE, coordination grossly normal MSK: normal muscle bulk  Assessment & Plan:  Britten was seen today for medical management of chronic issues.  Diagnoses and all orders for this visit:  Osteoporosis with current pathological fracture with routine healing, unspecified osteoporosis type, subsequent encounter Will mail information about applying for the grant to help with cost of Prolia.  Type 2 diabetes mellitus without complication, without long-term current use  of insulin (HCC) Stable, continue below -     metFORMIN (GLUCOPHAGE-XR) 500 MG 24 hr tablet; Take 1 tablet (500 mg total) by mouth 2 (two) times daily.  Essential hypertension Stable, continue current medicines.  Needs refill on below. -     lisinopril (PRINIVIL,ZESTRIL) 10 MG tablet; Take 1 tablet (10 mg total) by mouth daily.   Follow up plan: Return in about 3 months (around 11/09/2018). Rex Kras, MD [facility]

## 2018-08-12 DIAGNOSIS — M25552 Pain in left hip: Secondary | ICD-10-CM | POA: Diagnosis not present

## 2018-08-12 DIAGNOSIS — M8000XA Age-related osteoporosis with current pathological fracture, unspecified site, initial encounter for fracture: Secondary | ICD-10-CM | POA: Diagnosis not present

## 2018-08-16 ENCOUNTER — Ambulatory Visit
Admission: RE | Admit: 2018-08-16 | Discharge: 2018-08-16 | Disposition: A | Discharge status: Home / Self Care | Payer: Medicare Other | Source: Ambulatory Visit | Attending: Pediatrics | Admitting: Pediatrics

## 2018-08-16 DIAGNOSIS — Z1231 Encounter for screening mammogram for malignant neoplasm of breast: Secondary | ICD-10-CM | POA: Diagnosis not present

## 2018-08-19 ENCOUNTER — Emergency Department (HOSPITAL_COMMUNITY): Payer: Medicare Other

## 2018-08-19 ENCOUNTER — Inpatient Hospital Stay (HOSPITAL_COMMUNITY)
Admission: EM | Admit: 2018-08-19 | Discharge: 2018-08-22 | DRG: 481 | Disposition: A | Discharge status: Skilled Nursing Facility | Payer: Medicare Other | Attending: Internal Medicine | Admitting: Internal Medicine

## 2018-08-19 DIAGNOSIS — Z8249 Family history of ischemic heart disease and other diseases of the circulatory system: Secondary | ICD-10-CM | POA: Diagnosis not present

## 2018-08-19 DIAGNOSIS — Z880 Allergy status to penicillin: Secondary | ICD-10-CM

## 2018-08-19 DIAGNOSIS — Z833 Family history of diabetes mellitus: Secondary | ICD-10-CM

## 2018-08-19 DIAGNOSIS — S199XXA Unspecified injury of neck, initial encounter: Secondary | ICD-10-CM | POA: Diagnosis not present

## 2018-08-19 DIAGNOSIS — E890 Postprocedural hypothyroidism: Secondary | ICD-10-CM | POA: Diagnosis present

## 2018-08-19 DIAGNOSIS — E782 Mixed hyperlipidemia: Secondary | ICD-10-CM | POA: Diagnosis present

## 2018-08-19 DIAGNOSIS — S7291XA Unspecified fracture of right femur, initial encounter for closed fracture: Secondary | ICD-10-CM

## 2018-08-19 DIAGNOSIS — Z791 Long term (current) use of non-steroidal anti-inflammatories (NSAID): Secondary | ICD-10-CM

## 2018-08-19 DIAGNOSIS — E119 Type 2 diabetes mellitus without complications: Secondary | ICD-10-CM

## 2018-08-19 DIAGNOSIS — Z7984 Long term (current) use of oral hypoglycemic drugs: Secondary | ICD-10-CM | POA: Diagnosis not present

## 2018-08-19 DIAGNOSIS — N183 Chronic kidney disease, stage 3 (moderate): Secondary | ICD-10-CM | POA: Diagnosis present

## 2018-08-19 DIAGNOSIS — I129 Hypertensive chronic kidney disease with stage 1 through stage 4 chronic kidney disease, or unspecified chronic kidney disease: Secondary | ICD-10-CM | POA: Diagnosis present

## 2018-08-19 DIAGNOSIS — Z803 Family history of malignant neoplasm of breast: Secondary | ICD-10-CM

## 2018-08-19 DIAGNOSIS — E1122 Type 2 diabetes mellitus with diabetic chronic kidney disease: Secondary | ICD-10-CM | POA: Diagnosis present

## 2018-08-19 DIAGNOSIS — Z9049 Acquired absence of other specified parts of digestive tract: Secondary | ICD-10-CM | POA: Diagnosis not present

## 2018-08-19 DIAGNOSIS — S72001A Fracture of unspecified part of neck of right femur, initial encounter for closed fracture: Secondary | ICD-10-CM | POA: Diagnosis present

## 2018-08-19 DIAGNOSIS — Y92009 Unspecified place in unspecified non-institutional (private) residence as the place of occurrence of the external cause: Secondary | ICD-10-CM

## 2018-08-19 DIAGNOSIS — Z9841 Cataract extraction status, right eye: Secondary | ICD-10-CM | POA: Diagnosis not present

## 2018-08-19 DIAGNOSIS — E039 Hypothyroidism, unspecified: Secondary | ICD-10-CM

## 2018-08-19 DIAGNOSIS — D62 Acute posthemorrhagic anemia: Secondary | ICD-10-CM | POA: Diagnosis not present

## 2018-08-19 DIAGNOSIS — M2550 Pain in unspecified joint: Secondary | ICD-10-CM | POA: Diagnosis not present

## 2018-08-19 DIAGNOSIS — M810 Age-related osteoporosis without current pathological fracture: Secondary | ICD-10-CM | POA: Diagnosis present

## 2018-08-19 DIAGNOSIS — Z823 Family history of stroke: Secondary | ICD-10-CM

## 2018-08-19 DIAGNOSIS — Z96653 Presence of artificial knee joint, bilateral: Secondary | ICD-10-CM | POA: Diagnosis present

## 2018-08-19 DIAGNOSIS — Z961 Presence of intraocular lens: Secondary | ICD-10-CM | POA: Diagnosis present

## 2018-08-19 DIAGNOSIS — S0003XA Contusion of scalp, initial encounter: Secondary | ICD-10-CM | POA: Diagnosis present

## 2018-08-19 DIAGNOSIS — M1990 Unspecified osteoarthritis, unspecified site: Secondary | ICD-10-CM | POA: Diagnosis present

## 2018-08-19 DIAGNOSIS — Z90710 Acquired absence of both cervix and uterus: Secondary | ICD-10-CM

## 2018-08-19 DIAGNOSIS — W1830XA Fall on same level, unspecified, initial encounter: Secondary | ICD-10-CM | POA: Diagnosis present

## 2018-08-19 DIAGNOSIS — S72001D Fracture of unspecified part of neck of right femur, subsequent encounter for closed fracture with routine healing: Secondary | ICD-10-CM | POA: Diagnosis not present

## 2018-08-19 DIAGNOSIS — S299XXA Unspecified injury of thorax, initial encounter: Secondary | ICD-10-CM | POA: Diagnosis not present

## 2018-08-19 DIAGNOSIS — Z79890 Hormone replacement therapy (postmenopausal): Secondary | ICD-10-CM

## 2018-08-19 DIAGNOSIS — I10 Essential (primary) hypertension: Secondary | ICD-10-CM

## 2018-08-19 DIAGNOSIS — Z79899 Other long term (current) drug therapy: Secondary | ICD-10-CM | POA: Diagnosis not present

## 2018-08-19 DIAGNOSIS — Z7982 Long term (current) use of aspirin: Secondary | ICD-10-CM | POA: Diagnosis not present

## 2018-08-19 DIAGNOSIS — S72141A Displaced intertrochanteric fracture of right femur, initial encounter for closed fracture: Secondary | ICD-10-CM

## 2018-08-19 DIAGNOSIS — S72141D Displaced intertrochanteric fracture of right femur, subsequent encounter for closed fracture with routine healing: Secondary | ICD-10-CM | POA: Diagnosis not present

## 2018-08-19 DIAGNOSIS — E559 Vitamin D deficiency, unspecified: Secondary | ICD-10-CM | POA: Diagnosis not present

## 2018-08-19 DIAGNOSIS — E1165 Type 2 diabetes mellitus with hyperglycemia: Secondary | ICD-10-CM | POA: Diagnosis present

## 2018-08-19 DIAGNOSIS — E1159 Type 2 diabetes mellitus with other circulatory complications: Secondary | ICD-10-CM | POA: Diagnosis present

## 2018-08-19 DIAGNOSIS — Z7401 Bed confinement status: Secondary | ICD-10-CM | POA: Diagnosis not present

## 2018-08-19 DIAGNOSIS — Z87442 Personal history of urinary calculi: Secondary | ICD-10-CM

## 2018-08-19 DIAGNOSIS — W19XXXD Unspecified fall, subsequent encounter: Secondary | ICD-10-CM | POA: Diagnosis not present

## 2018-08-19 DIAGNOSIS — S72009A Fracture of unspecified part of neck of unspecified femur, initial encounter for closed fracture: Secondary | ICD-10-CM | POA: Diagnosis present

## 2018-08-19 LAB — I-STAT TROPONIN, ED: TROPONIN I, POC: 0.01 ng/mL (ref 0.00–0.08)

## 2018-08-19 LAB — COMPREHENSIVE METABOLIC PANEL
ALT: 19 U/L (ref 0–44)
AST: 21 U/L (ref 15–41)
Albumin: 3.8 g/dL (ref 3.5–5.0)
Alkaline Phosphatase: 81 U/L (ref 38–126)
Anion gap: 13 (ref 5–15)
BUN: 25 mg/dL — AB (ref 8–23)
CO2: 21 mmol/L — ABNORMAL LOW (ref 22–32)
CREATININE: 1.3 mg/dL — AB (ref 0.44–1.00)
Calcium: 9.9 mg/dL (ref 8.9–10.3)
Chloride: 105 mmol/L (ref 98–111)
GFR calc Af Amer: 46 mL/min — ABNORMAL LOW (ref >60)
GFR, EST NON AFRICAN AMERICAN: 40 mL/min — AB (ref >60)
Glucose, Bld: 365 mg/dL — ABNORMAL HIGH (ref 70–99)
POTASSIUM: 3.7 mmol/L (ref 3.5–5.1)
Sodium: 139 mmol/L (ref 135–145)
TOTAL PROTEIN: 7.2 g/dL (ref 6.5–8.1)
Total Bilirubin: 0.3 mg/dL (ref 0.3–1.2)

## 2018-08-19 LAB — CBC WITH DIFFERENTIAL/PLATELET
Abs Immature Granulocytes: 0.06 10*3/uL (ref 0.00–0.07)
BASOS PCT: 0 %
Basophils Absolute: 0.1 10*3/uL (ref 0.0–0.1)
EOS ABS: 0.1 10*3/uL (ref 0.0–0.5)
Eosinophils Relative: 0 %
HEMATOCRIT: 41.9 % (ref 36.0–46.0)
Hemoglobin: 13.8 g/dL (ref 12.0–15.0)
IMMATURE GRANULOCYTES: 0 %
Lymphocytes Relative: 11 %
Lymphs Abs: 1.4 10*3/uL (ref 0.7–4.0)
MCH: 28.7 pg (ref 26.0–34.0)
MCHC: 32.9 g/dL (ref 30.0–36.0)
MCV: 87.1 fL (ref 80.0–100.0)
MONOS PCT: 5 %
Monocytes Absolute: 0.6 10*3/uL (ref 0.1–1.0)
Neutro Abs: 11.1 10*3/uL — ABNORMAL HIGH (ref 1.7–7.7)
Neutrophils Relative %: 84 %
Platelets: 236 10*3/uL (ref 150–400)
RBC: 4.81 MIL/uL (ref 3.87–5.11)
RDW: 14.3 % (ref 11.5–15.5)
WBC: 13.4 10*3/uL — ABNORMAL HIGH (ref 4.0–10.5)
nRBC: 0 % (ref 0.0–0.2)

## 2018-08-19 MED ORDER — MORPHINE SULFATE (PF) 4 MG/ML IV SOLN
4.0000 mg | Freq: Once | INTRAVENOUS | Status: AC
  Administered 2018-08-19: 4 mg via INTRAVENOUS
  Filled 2018-08-19: qty 1

## 2018-08-19 MED ORDER — HYDROMORPHONE HCL 1 MG/ML IJ SOLN
1.0000 mg | Freq: Once | INTRAMUSCULAR | Status: AC
  Administered 2018-08-19: 1 mg via INTRAVENOUS
  Filled 2018-08-19: qty 1

## 2018-08-19 MED ORDER — ONDANSETRON HCL 4 MG/2ML IJ SOLN
4.0000 mg | Freq: Once | INTRAMUSCULAR | Status: AC
  Administered 2018-08-19: 4 mg via INTRAVENOUS
  Filled 2018-08-19: qty 2

## 2018-08-19 MED ORDER — IOPAMIDOL (ISOVUE-370) INJECTION 76%
100.0000 mL | Freq: Once | INTRAVENOUS | Status: AC | PRN
  Administered 2018-08-19: 100 mL via INTRAVENOUS

## 2018-08-19 NOTE — ED Triage Notes (Signed)
Pt lost her balance and fell at home, states pain to right hip, appears shorter.  Alert and oriented.

## 2018-08-19 NOTE — ED Notes (Signed)
Patient transported to CT 

## 2018-08-19 NOTE — Progress Notes (Signed)
Patient ID: Meredith Shaw, female   DOB: [birth year], 72 y.o.   MRN: [id number] I have seen the patient's x-rays.  She has a right hip intertrochanteric fracture that will need surgical fixation.  She will be put on the OR schedule for tomorrow for sometime late morning vs early afternoon given the amount of cases that are all ready scheduled for tomorrow.

## 2018-08-19 NOTE — ED Notes (Signed)
Admitting provider bedside 

## 2018-08-19 NOTE — H&P (Signed)
[name]:[id number] DOB: May 02, 1946 DOA: 08/19/2018     PCP: Eustaquio Maize, MD   Outpatient Specialists: Orthopedic Dr. Felicie Morn    Patient arrived to ER on 08/19/18 at 2043  Patient coming from: home Lives  With family    Chief Complaint:  Chief Complaint  Patient presents with  . Fall  . Hip Pain    HPI: Meredith Shaw is a 72 y.o. female with medical history significant of diabetes, hypertension, hyperlipidemia, hypothyroidism bilateral knee replacements osteoporosis, CKD, kidney stones    Presented with mechanical fall was at home walking lost balance. She unsure how it happened had a fall onto the right hip severe pain initially unable to wear weight endorses head injury as well not on any blood thinners had prior bilateral knee replacements as well as left hip replacement by Dr. Ninfa Linden 6 m ago   She is able to walk up a flight of stairs able to walk to her mailbox without any chest pain or shortness of breath.  Regarding pertinent Chronic problems:   diabetes on metformin history of hypertension on metoprolol lisinopril and amlodipine   While in ER:  The following Work up has been ordered so far:  Orders Placed This Encounter  Procedures  . CT Head Wo Contrast  . CT Cervical Spine Wo Contrast  . DG Chest 1 View  . DG Hip Unilat W or Wo Pelvis 2-3 Views Right  . CBC with Differential/Platelet  . Comprehensive metabolic panel  . Diet NPO time specified  . Informed Consent Details: Transcribe to consent form and obtain patient signature  . Consult to orthopedic surgery  . Consult to hospitalist  . I-stat troponin, ED  . EKG 12-Lead  . EKG 12-Lead    Following Medications were ordered in ER: Medications  HYDROmorphone (DILAUDID) injection 1 mg (has no administration in time range)  morphine 4 MG/ML injection 4 mg (4 mg Intravenous Given 08/19/18 2109)  ondansetron (ZOFRAN) injection 4 mg (4 mg Intravenous Given 08/19/18 2108)  iopamidol  (ISOVUE-370) 76 % injection 100 mL (100 mLs Intravenous Contrast Given 08/19/18 2129)    Significant initial  Findings: Abnormal Labs Reviewed  CBC WITH DIFFERENTIAL/PLATELET - Abnormal; Notable for the following components:      Result Value   WBC 13.4 (*)    Neutro Abs 11.1 (*)    All other components within normal limits  COMPREHENSIVE METABOLIC PANEL - Abnormal; Notable for the following components:   CO2 21 (*)    Glucose, Bld 365 (*)    BUN 25 (*)    Creatinine, Ser 1.30 (*)    GFR calc non Af Amer 40 (*)    GFR calc Af Amer 46 (*)    All other components within normal limits    Na 139 K 3.7 Glucose 365  Cr    Stable  Lab Results  Component Value Date   CREATININE 1.30 (H) 08/19/2018   CREATININE 1.30 (H) 06/06/2018   CREATININE 1.37 (H) 03/10/2018    trop 0.01  WBC  13.4  HG/HCT   stable,      Component Value Date/Time   HGB 13.8 08/19/2018 2105   HCT 41.9 08/19/2018 2105      Troponin (Point of Care Test) Recent Labs    08/19/18 2114  TROPIPOC 0.01       UA  not ordered  Right Hip acute displaced intertrochanteric fracture of the right hip. CT HEAD no    intracranial  bleeding but Large right posterior scalp hematoma  CXR -  NON acute  CTabd/pelvis - *nonacute  ECG:  Personally reviewed by me showing: HR : 86 Rhythm:  NSR    no evidence of ischemic changes QTC 474     ED Triage Vitals  Enc Vitals Group     BP 08/19/18 2053 (!) 186/96     Pulse Rate 08/19/18 2053 96     Resp 08/19/18 2053 18     Temp 08/19/18 2053 98.7 F (37.1 C)     Temp Source 08/19/18 2053 Oral     SpO2 08/19/18 2053 98 %     Weight 08/19/18 2054 170 lb (77.1 kg)     Height 08/19/18 2054 5' 7"  (1.702 m)     Head Circumference --      Peak Flow --      Pain Score 08/19/18 2054 10     Pain Loc --      Pain Edu? --      Excl. in [place]? --   TMAX(24)@       Latest  Blood pressure (!) 186/96, pulse 96, temperature 98.7 F (37.1 C), temperature source Oral, resp.  rate 18, height 5' 7"  (1.702 m), weight 77.1 kg, SpO2 98 %.     ER Provider Called: Orthopedics   Dr.Blackman  They Recommend n.p.o. postmidnight plan to operate in a.m. Will see in AM  Hospitalist was called for admission for right hip intertrochanteric fracture   Review of Systems:    Pertinent positives include: fall   Constitutional:  No weight loss, night sweats, Fevers, chills, fatigue, weight loss  HEENT:  No headaches, Difficulty swallowing,Tooth/dental problems,Sore throat,  No sneezing, itching, ear ache, nasal congestion, post nasal drip,  Cardio-vascular:  No chest pain, Orthopnea, PND, anasarca, dizziness, palpitations.no Bilateral lower extremity swelling  GI:  No heartburn, indigestion, abdominal pain, nausea, vomiting, diarrhea, change in bowel habits, loss of appetite, melena, blood in stool, hematemesis Resp:  no shortness of breath at rest. No dyspnea on exertion, No excess mucus, no productive cough, No non-productive cough, No coughing up of blood.No change in color of mucus.No wheezing. Skin:  no rash or lesions. No jaundice GU:  no dysuria, change in color of urine, no urgency or frequency. No straining to urinate.  No flank pain.  Musculoskeletal:  No joint pain or no joint swelling. No decreased range of motion. No back pain.  Psych:  No change in mood or affect. No depression or anxiety. No memory loss.  Neuro: no localizing neurological complaints, no tingling, no weakness, no double vision, no gait abnormality, no slurred speech, no confusion  All systems reviewed and apart from [place] all are negative  Past Medical History:   Past Medical History:  Diagnosis Date  . Arthritis    OA AND PAIN BOTH KNEES -RIGHT KNEE HURTS WORSE THAN LEFT  . Cataract   . Diabetes mellitus    ORAL MEDICATION - NO INSULIN  . Hypertension   . Hypothyroidism   . Kidney stones    NONE AT PRESENT TIME THAT PT IS AWARE OF  . Osteoporosis       Past Surgical  History:  Procedure Laterality Date  . ABDOMINAL HYSTERECTOMY    . CHOLECYSTECTOMY  1972  . COLONOSCOPY    . EYE SURGERY     RIGHT CATARACT EXTRACTON WITH LENS IMPLANT  . GROWTH REMOVED FROM THYROID    . INTRAMEDULLARY (IM) NAIL INTERTROCHANTERIC Left 02/10/2018   Procedure:  INTRAMEDULLARY (IM) NAIL INTERTROCHANTRIC;  Surgeon: Mcarthur Rossetti, MD;  Location: [facility];  Service: Orthopedics;  Laterality: Left;  . left hip surgery   02/2018  . PERCUTANEOUS NEPHROLITHOTOMY    . TOTAL KNEE ARTHROPLASTY  06/09/2012   Procedure: TOTAL KNEE ARTHROPLASTY;  Surgeon: Mcarthur Rossetti, MD;  Location: [facility];  Service: Orthopedics;  Laterality: Right;  Right Total Knee Arthroplasty  . TOTAL KNEE ARTHROPLASTY Left 04/09/2014   Procedure: LEFT TOTAL KNEE ARTHROPLASTY;  Surgeon: Mcarthur Rossetti, MD;  Location: [facility];  Service: Orthopedics;  Laterality: Left;  . TUBAL LIGATION      Social History:  Ambulatory with  Kasandra Knudsen,      reports that she has never smoked. She has never used smokeless tobacco. She reports that she does not drink alcohol or use drugs.  Family History:   Family History  Problem Relation Age of Onset  . Heart disease Mother   . Breast cancer Mother        81's  . Stroke Father   . Heart disease Father   . Healthy Daughter   . Healthy Son   . Liver disease Maternal Grandmother        gall stones imbedded in liver - blead to death when in surgery   . Pneumonia Maternal Grandfather   . Diabetes Paternal Grandmother     Allergies: Allergies  Allergen Reactions  . Penicillins Itching and Rash     Prior to Admission medications   Medication Sig Start Date End Date Taking? Authorizing Provider  amLODipine (NORVASC) 5 MG tablet Take 1 tablet (5 mg total) by mouth daily. Patient taking differently: Take 10 mg by mouth daily.  06/06/18  Yes Eustaquio Maize, MD  aspirin 81 MG chewable tablet Chew by mouth daily.   Yes [provider]    cholecalciferol (VITAMIN D) 1000 units tablet Take 5,000 Units by mouth daily.    Yes [provider]  levothyroxine (SYNTHROID, LEVOTHROID) 137 MCG tablet TAKE ONE (1) TABLET EACH DAY Patient taking differently: Take 137 mcg by mouth daily before breakfast.  01/18/18  Yes Hawks, Christy A, FNP  lisinopril (PRINIVIL,ZESTRIL) 10 MG tablet Take 1 tablet (10 mg total) by mouth daily. Patient taking differently: Take 20 mg by mouth daily.  08/09/18  Yes Eustaquio Maize, MD  metFORMIN (GLUCOPHAGE-XR) 500 MG 24 hr tablet Take 1 tablet (500 mg total) by mouth 2 (two) times daily. Patient taking differently: Take 1,000 mg by mouth 2 (two) times daily.  08/09/18  Yes Eustaquio Maize, MD  metoprolol tartrate (LOPRESSOR) 100 MG tablet Take 1 tablet (100 mg total) by mouth 2 (two) times daily. 06/06/18  Yes Eustaquio Maize, MD  naproxen sodium (ALEVE) 220 MG tablet Take 220 mg by mouth 2 (two) times daily as needed (for pain).   Yes [provider]  pravastatin (PRAVACHOL) 40 MG tablet Take 1 tablet (40 mg total) by mouth at bedtime. 06/06/18  Yes Eustaquio Maize, MD  Blood Glucose Monitoring Suppl (ONETOUCH VERIO) w/Device KIT 1 each by Does not apply route daily. 03/10/18   Eustaquio Maize, MD  glucose blood [facility] VERIO) test strip Use as instructed 03/10/18   Eustaquio Maize, MD  Lancets [facility] ULTRASOFT) lancets Use as instructed 03/10/18   Eustaquio Maize, MD   Physical Exam: Blood pressure (!) 186/96, pulse 96, temperature 98.7 F (37.1 C), temperature source Oral, resp. rate 18, height 5' 7"  (1.702 m), weight 77.1 kg,  SpO2 98 %. 1. General:  in No Acute distress  well  -appearing 2. Psychological: Alert and   Oriented 3. Head/ENT:    Dry Mucous Membranes                          Head Non traumatic, neck supple                          Normal  Dentition 4. SKIN:   decreased Skin turgor,  Skin clean Dry and intact no rash 5. Heart: Regular rate and rhythm no  Murmur, no  Rub or gallop 6. Lungs:   no wheezes or crackles   7. Abdomen: Soft, non-tender,  distended  obese  bowel sounds present 8. Lower extremities: no clubbing, cyanosis, or  edema 9. Neurologically Grossly intact, moving all 4 extremities equally  10. MSK: Normal range of motion, except for right leg limited due to pain    LABS:     Recent Labs  Lab 08/19/18 2105  WBC 13.4*  NEUTROABS 11.1*  HGB 13.8  HCT 41.9  MCV 87.1  PLT 329   Basic Metabolic Panel: Recent Labs  Lab 08/19/18 2105  NA 139  K 3.7  CL 105  CO2 21*  GLUCOSE 365*  BUN 25*  CREATININE 1.30*  CALCIUM 9.9      Recent Labs  Lab 08/19/18 2105  AST 21  ALT 19  ALKPHOS 81  BILITOT 0.3  PROT 7.2  ALBUMIN 3.8   No results for input(s): LIPASE, AMYLASE in the last 168 hours. No results for input(s): AMMONIA in the last 168 hours.    HbA1C: No results for input(s): HGBA1C in the last 72 hours. CBG: No results for input(s): GLUCAP in the last 168 hours.    Urine analysis:    Component Value Date/Time   COLORURINE YELLOW 02/13/2018 1335   APPEARANCEUR HAZY (A) 02/13/2018 1335   APPEARANCEUR Clear 12/31/2017 0829   LABSPEC 1.018 02/13/2018 1335   PHURINE 5.0 02/13/2018 1335   GLUCOSEU NEGATIVE 02/13/2018 1335   HGBUR LARGE (A) 02/13/2018 1335   BILIRUBINUR NEGATIVE 02/13/2018 1335   BILIRUBINUR Negative 12/31/2017 0829   KETONESUR NEGATIVE 02/13/2018 1335   PROTEINUR 30 (A) 02/13/2018 1335   UROBILINOGEN 0.2 04/02/2014 1424   NITRITE NEGATIVE 02/13/2018 1335   LEUKOCYTESUR MODERATE (A) 02/13/2018 1335   LEUKOCYTESUR 2+ (A) 12/31/2017 0829      Cultures:    Component Value Date/Time   SDES BLOOD LEFT HAND 02/13/2018 1619   SPECREQUEST  02/13/2018 1619    BOTTLES DRAWN AEROBIC AND ANAEROBIC Blood Culture adequate volume   CULT  02/13/2018 1619    NO GROWTH 5 DAYS Performed at [facility], [place] [address] [place], [place] [address]    REPTSTATUS 02/18/2018 FINAL 02/13/2018 1619       Radiological Exams on Admission: Dg Chest 1 View  Result Date: 08/19/2018 CLINICAL DATA:  [place].  Right hip fracture. EXAM: CHEST  1 VIEW COMPARISON:  02/09/2018 FINDINGS: The heart is enlarged but stable. There is tortuosity and mild ectasia of the thoracic aorta. Prominent mediastinal contours due to the supine position the patient and the AP projection of the film. The lungs are clear. No pneumothorax. Stable eventration right hemidiaphragm. The bony thorax is intact. IMPRESSION: No acute cardiopulmonary findings. Stable cardiac enlargement and prominent mediastinal contours due to the supine position the patient and the AP projection of  the film. Electronically Signed   By: Marijo Sanes M.D.   On: 08/19/2018 21:59   Ct Head Wo Contrast  Result Date: 08/19/2018 CLINICAL DATA:  Fall at home. EXAM: CT HEAD WITHOUT CONTRAST CT CERVICAL SPINE WITHOUT CONTRAST TECHNIQUE: Multidetector CT imaging of the head and cervical spine was performed following the standard protocol without intravenous contrast. Multiplanar CT image reconstructions of the cervical spine were also generated. COMPARISON:  None. FINDINGS: CT HEAD FINDINGS Brain: Mild diffuse cortical atrophy is noted. Mild chronic ischemic white matter disease is noted. No mass effect or midline shift is noted. Ventricular size is within normal limits. There is no evidence of mass lesion, hemorrhage or acute infarction. Vascular: No hyperdense vessel or unexpected calcification. Skull: Normal. Negative for fracture or focal lesion. Sinuses/Orbits: No acute finding. Other: Large right posterior scalp hematoma is noted. CT CERVICAL SPINE FINDINGS Alignment: Grade 1 anterolisthesis of C4-5 is noted secondary to posterior facet joint hypertrophy. Skull base and vertebrae: No acute fracture. No primary bone lesion or focal pathologic process. Soft tissues and spinal canal: No prevertebral fluid or swelling. No visible canal hematoma. Disc levels: Severe  degenerative disc disease is noted at C4-5 and C5-6 with anterior osteophyte formation. Upper chest: Negative. Other: Degenerative changes are seen involving the posterior facet joints bilaterally. IMPRESSION: Mild diffuse cortical atrophy. Mild chronic ischemic white matter disease. Large right posterior scalp hematoma. No acute intracranial abnormality seen. Severe multilevel degenerative disc disease. No acute abnormality seen in the cervical spine. Electronically Signed   By: Marijo Conception, M.D.   On: 08/19/2018 21:55   Ct Cervical Spine Wo Contrast  Result Date: 08/19/2018 CLINICAL DATA:  Fall at home. EXAM: CT HEAD WITHOUT CONTRAST CT CERVICAL SPINE WITHOUT CONTRAST TECHNIQUE: Multidetector CT imaging of the head and cervical spine was performed following the standard protocol without intravenous contrast. Multiplanar CT image reconstructions of the cervical spine were also generated. COMPARISON:  None. FINDINGS: CT HEAD FINDINGS Brain: Mild diffuse cortical atrophy is noted. Mild chronic ischemic white matter disease is noted. No mass effect or midline shift is noted. Ventricular size is within normal limits. There is no evidence of mass lesion, hemorrhage or acute infarction. Vascular: No hyperdense vessel or unexpected calcification. Skull: Normal. Negative for fracture or focal lesion. Sinuses/Orbits: No acute finding. Other: Large right posterior scalp hematoma is noted. CT CERVICAL SPINE FINDINGS Alignment: Grade 1 anterolisthesis of C4-5 is noted secondary to posterior facet joint hypertrophy. Skull base and vertebrae: No acute fracture. No primary bone lesion or focal pathologic process. Soft tissues and spinal canal: No prevertebral fluid or swelling. No visible canal hematoma. Disc levels: Severe degenerative disc disease is noted at C4-5 and C5-6 with anterior osteophyte formation. Upper chest: Negative. Other: Degenerative changes are seen involving the posterior facet joints bilaterally.  IMPRESSION: Mild diffuse cortical atrophy. Mild chronic ischemic white matter disease. Large right posterior scalp hematoma. No acute intracranial abnormality seen. Severe multilevel degenerative disc disease. No acute abnormality seen in the cervical spine. Electronically Signed   By: Marijo Conception, M.D.   On: 08/19/2018 21:55   Dg Hip Unilat W Or Wo Pelvis 2-3 Views Right  Result Date: 08/19/2018 CLINICAL DATA:  [place].  Right hip pain. EXAM: DG HIP (WITH OR WITHOUT PELVIS) 2-3V RIGHT COMPARISON:  06/22/2018 FINDINGS: Stable surgical changes from left hip fracture fixation with a gamma nail and compression screw. Progressive healing changes. Advanced left hip joint degenerative changes. There is a displaced intertrochanteric fracture of the  right hip with a marked varus deformity. The pubic symphysis and SI joints are intact. No pelvic fractures. IMPRESSION: New/acute displaced intertrochanteric fracture of the right hip. Remote healed/healing left hip fracture. No definite pelvic fractures. Electronically Signed   By: Marijo Sanes M.D.   On: 08/19/2018 21:58    Chart has been reviewed    Assessment/Plan   72 y.o. female with medical history significant of diabetes, hypertension, hyperlipidemia, hypothyroidism bilateral knee replacements osteoporosis, CKD, kidney stones  Admitted for right hip intertrochanteric fracture   Present on Admission: . Closed fracture of femur, intertrochanteric, right, initial encounter ([place]) -  - management as per orthopedics,  plan to operate  In afternoon    Keep nothing by mouth post midnight. Patient  not on anticoagulation   antiplatelet agents  on hold Ordered type and screen, Place Foley, order a vitamin D level  Patient at baseline  able to walk a flight of stairs or 100 feet      Patient denies any chest pain or shortness of breath currently and/or with exertion,  ECG showing no evidence of acute ischemia  no known history of coronary artery disease,  COPD Liver failure Positive hx of  CKD - stable at baseline   Given advanced age patient is at least moderate  Risk  which has been discussed with family but at this point no furthther cardiac workup is indicated.     . Hypertension -continue metoprolol otherwise hold other home medications restart when able hold metoprolol while n.p.o. . Hypothyroidism stable restart home medication check TSH . Hyperlipidemia, mixed restart home medications unable to tolerate . CKD (chronic kidney disease) stage 3, GFR 30-59 ml/min (HCC) -chronic currently at baseline avoid nephrotoxic medication . Controlled type 2 diabetes mellitus with hyperglycemia (HCC) -  - Order Sensitive *   SSI     -  check TSH and HgA1C  - Hold by mouth medications     Other plan as per orders.  DVT prophylaxis:  SCD    Code Status:  FULL CODE   as per patient  I had personally discussed CODE STATUS with patient    Family Communication:   Family  at  Bedside  plan of care was discussed with  Son, Daughter    Disposition Plan:     likely will need placement for rehabilitation                                            Would benefit from PT/OT eval prior to DC  Ordered                   Swallow eval - SLP ordered                   Social Work  consulted                                       Consults called: Orthopedics  Admission status:    inpatient     Expect 2 midnight stay secondary to severity of patient's current illness including    Severe Radiological abnormalities including hip fracture and extensive comorbidities including:  DM2   CKD   That are currently affecting medical management.  I expect  patient to be hospitalized for  2 midnights requiring inpatient medical care.  Patient is at high risk for adverse outcome (such as loss of life or disability) if not treated.  Indication for inpatient stay as follows:     severe pain requiring acute inpatient management,  inability to maintain oral  hydration    Need for operative intervention Need for   IV fluids,   IV pain  medications      Level of care      medical floor            Farran Amsden 08/20/2018, 12:25 AM    [facility]  Pager [phone]   after 2 AM please page floor coverage PA If 7AM-7PM, please contact the day team taking care of the patient  [email or web address]  Password [email or web address]

## 2018-08-19 NOTE — ED Provider Notes (Signed)
[facility] EMERGENCY DEPARTMENT Provider Note   CSN: [id number] Arrival date & time: 08/19/18  2043     History   Chief Complaint Chief Complaint  Patient presents with  . Fall  . Hip Pain    HPI Meredith Shaw is a 72 y.o. female history of diabetes, hypertension, hyperlipidemia, here presenting with fall.  Patient states that she was at home when she was walking and loss of balance and fell onto the right hip.  She states that she has severe pain and was unable to bear weight in the right hip afterwards.  Her head as well and had some bleeding and bruising.  Patient denies being on any blood thinners currently.  She had previous left hip replacement by Dr. Ninfa Linden. Tdap up to date   The history is provided by the patient.    Past Medical History:  Diagnosis Date  . Arthritis    OA AND PAIN BOTH KNEES -RIGHT KNEE HURTS WORSE THAN LEFT  . Cataract   . Diabetes mellitus    ORAL MEDICATION - NO INSULIN  . Hypertension   . Hypothyroidism   . Kidney stones    NONE AT PRESENT TIME THAT PT IS AWARE OF  . Osteoporosis     Patient Active Problem List   Diagnosis Date Noted  . Type 2 diabetes mellitus with complication, without long-term current use of insulin ([place]) 02/15/2018  . Closed left hip fracture ([place]) 02/10/2018  . CKD (chronic kidney disease) stage 3, GFR 30-59 ml/min ([facility]) 02/10/2018  . Controlled type 2 diabetes mellitus with hyperglycemia ([place]) 02/10/2018  . Hip fracture ([place]) 02/10/2018  . Pressure injury of skin 02/10/2018  . Chest pain 02/19/2016  . Arthritis of left knee 04/09/2014  . Status post total knee replacement 04/09/2014  . Arthropathy   . Diabetes ([place])   . Hypertension   . Kidney stones   . Hypothyroidism   . Degenerative arthritis of knee 06/09/2012  . Flatulence, eructation, and gas pain 12/24/2005  . Abdominal or pelvic swelling, mass, or lump, other specified site 12/24/2005  . Osteoporosis 12/04/2004  . Symptomatic  menopausal or female climacteric states 12/04/2004  . Arthropathy, lower leg 04/21/2004  . Hematuria 09/17/2003  . Other long term (current) drug therapy 07/05/2002  . Hyperlipidemia, mixed 03/01/2000    Past Surgical History:  Procedure Laterality Date  . ABDOMINAL HYSTERECTOMY    . CHOLECYSTECTOMY  1972  . COLONOSCOPY    . EYE SURGERY     RIGHT CATARACT EXTRACTON WITH LENS IMPLANT  . GROWTH REMOVED FROM THYROID    . INTRAMEDULLARY (IM) NAIL INTERTROCHANTERIC Left 02/10/2018   Procedure: INTRAMEDULLARY (IM) NAIL INTERTROCHANTRIC;  Surgeon: Mcarthur Rossetti, MD;  Location: [place];  Service: Orthopedics;  Laterality: Left;  . left hip surgery   02/2018  . PERCUTANEOUS NEPHROLITHOTOMY    . TOTAL KNEE ARTHROPLASTY  06/09/2012   Procedure: TOTAL KNEE ARTHROPLASTY;  Surgeon: Mcarthur Rossetti, MD;  Location: [facility];  Service: Orthopedics;  Laterality: Right;  Right Total Knee Arthroplasty  . TOTAL KNEE ARTHROPLASTY Left 04/09/2014   Procedure: LEFT TOTAL KNEE ARTHROPLASTY;  Surgeon: Mcarthur Rossetti, MD;  Location: [place];  Service: Orthopedics;  Laterality: Left;  . TUBAL LIGATION       OB History   None      Home Medications    Prior to Admission medications   Medication Sig Start Date End Date Taking? Authorizing Provider  amLODipine (NORVASC) 5 MG tablet  Take 1 tablet (5 mg total) by mouth daily. 06/06/18   Eustaquio Maize, MD  aspirin 81 MG chewable tablet Chew by mouth daily.    [provider]  Blood Glucose Monitoring Suppl (ONETOUCH VERIO) w/Device KIT 1 each by Does not apply route daily. 03/10/18   Eustaquio Maize, MD  cholecalciferol (VITAMIN D) 1000 units tablet Take 1,000 Units by mouth daily.    [provider]  glucose blood (ONETOUCH VERIO) test strip Use as instructed 03/10/18   Eustaquio Maize, MD  ibuprofen (ADVIL,MOTRIN) 800 MG tablet 1 po bid with food 05/12/05   [provider]  Lancets [facility] ULTRASOFT) lancets Use  as instructed 03/10/18   Eustaquio Maize, MD  levothyroxine (SYNTHROID, LEVOTHROID) 137 MCG tablet TAKE ONE (1) TABLET EACH DAY 01/18/18   Hawks, Christy A, FNP  lisinopril (PRINIVIL,ZESTRIL) 10 MG tablet Take 1 tablet (10 mg total) by mouth daily. 08/09/18   Eustaquio Maize, MD  metFORMIN (GLUCOPHAGE-XR) 500 MG 24 hr tablet Take 1 tablet (500 mg total) by mouth 2 (two) times daily. 08/09/18   Eustaquio Maize, MD  metoprolol tartrate (LOPRESSOR) 100 MG tablet Take 1 tablet (100 mg total) by mouth 2 (two) times daily. 06/06/18   Eustaquio Maize, MD  pravastatin (PRAVACHOL) 40 MG tablet Take 1 tablet (40 mg total) by mouth at bedtime. 06/06/18   Eustaquio Maize, MD    Family History Family History  Problem Relation Age of Onset  . Heart disease Mother   . Breast cancer Mother        41's  . Stroke Father   . Heart disease Father   . Healthy Daughter   . Healthy Son   . Liver disease Maternal Grandmother        gall stones imbedded in liver - blead to death when in surgery   . Pneumonia Maternal Grandfather   . Diabetes Paternal Grandmother     Social History Social History   Tobacco Use  . Smoking status: Never Smoker  . Smokeless tobacco: Never Used  Substance Use Topics  . Alcohol use: No  . Drug use: No     Allergies   Penicillins   Review of Systems Review of Systems  Musculoskeletal:       R hip pain      Physical Exam Updated Vital Signs BP (!) 186/96 (BP Location: Right Arm)   Pulse 96   Temp 98.7 F (37.1 C) (Oral)   Resp 18   Ht 5' 7"  (1.702 m)   Wt 77.1 kg   SpO2 98%   BMI 26.63 kg/m   Physical Exam  Constitutional: She is oriented to person, place, and time.  Uncomfortable   HENT:  Mouth/Throat: Oropharynx is clear and moist.  Abrasion R posterior scalp, no obvious laceration.   Eyes: Pupils are equal, round, and reactive to light. Conjunctivae and EOM are normal.  Neck: Normal range of motion. Neck supple.  Cardiovascular: Normal rate,  regular rhythm and normal heart sounds.  Pulmonary/Chest: Effort normal and breath sounds normal. No stridor. No respiratory distress.  Abdominal: Soft. Bowel sounds are normal. She exhibits no distension. There is no tenderness.  Musculoskeletal:  R hip shortened and rotated. Unable to range the hip. No femur or tib/fib tenderness.   Neurological: She is alert and oriented to person, place, and time. No cranial nerve deficit. Coordination normal.  Skin: Skin is warm. Capillary refill takes less than 2 seconds.  Psychiatric:  She has a normal mood and affect.  Nursing note and vitals reviewed.    ED Treatments / Results  Labs (all labs ordered are listed, but only abnormal results are displayed) Labs Reviewed  CBC WITH DIFFERENTIAL/PLATELET - Abnormal; Notable for the following components:      Result Value   WBC 13.4 (*)    Neutro Abs 11.1 (*)    All other components within normal limits  COMPREHENSIVE METABOLIC PANEL - Abnormal; Notable for the following components:   CO2 21 (*)    Glucose, Bld 365 (*)    BUN 25 (*)    Creatinine, Ser 1.30 (*)    GFR calc non Af Amer 40 (*)    GFR calc Af Amer 46 (*)    All other components within normal limits  I-STAT TROPONIN, ED    EKG EKG Interpretation  Date/Time:  Saturday August 19 2018 22:06:46 EST Ventricular Rate:  86 PR Interval:    QRS Duration: 89 QT Interval:  396 QTC Calculation: 474 R Axis:   15 Text Interpretation:  Sinus rhythm No significant change since last tracing Confirmed by Wandra Arthurs [phone]) on 08/19/2018 10:10:35 PM   Radiology Dg Chest 1 View  Result Date: 08/19/2018 CLINICAL DATA:  [place].  Right hip fracture. EXAM: CHEST  1 VIEW COMPARISON:  02/09/2018 FINDINGS: The heart is enlarged but stable. There is tortuosity and mild ectasia of the thoracic aorta. Prominent mediastinal contours due to the supine position the patient and the AP projection of the film. The lungs are clear. No pneumothorax. Stable  eventration right hemidiaphragm. The bony thorax is intact. IMPRESSION: No acute cardiopulmonary findings. Stable cardiac enlargement and prominent mediastinal contours due to the supine position the patient and the AP projection of the film. Electronically Signed   By: Marijo Sanes M.D.   On: 08/19/2018 21:59   Ct Head Wo Contrast  Result Date: 08/19/2018 CLINICAL DATA:  Fall at home. EXAM: CT HEAD WITHOUT CONTRAST CT CERVICAL SPINE WITHOUT CONTRAST TECHNIQUE: Multidetector CT imaging of the head and cervical spine was performed following the standard protocol without intravenous contrast. Multiplanar CT image reconstructions of the cervical spine were also generated. COMPARISON:  None. FINDINGS: CT HEAD FINDINGS Brain: Mild diffuse cortical atrophy is noted. Mild chronic ischemic white matter disease is noted. No mass effect or midline shift is noted. Ventricular size is within normal limits. There is no evidence of mass lesion, hemorrhage or acute infarction. Vascular: No hyperdense vessel or unexpected calcification. Skull: Normal. Negative for fracture or focal lesion. Sinuses/Orbits: No acute finding. Other: Large right posterior scalp hematoma is noted. CT CERVICAL SPINE FINDINGS Alignment: Grade 1 anterolisthesis of C4-5 is noted secondary to posterior facet joint hypertrophy. Skull base and vertebrae: No acute fracture. No primary bone lesion or focal pathologic process. Soft tissues and spinal canal: No prevertebral fluid or swelling. No visible canal hematoma. Disc levels: Severe degenerative disc disease is noted at C4-5 and C5-6 with anterior osteophyte formation. Upper chest: Negative. Other: Degenerative changes are seen involving the posterior facet joints bilaterally. IMPRESSION: Mild diffuse cortical atrophy. Mild chronic ischemic white matter disease. Large right posterior scalp hematoma. No acute intracranial abnormality seen. Severe multilevel degenerative disc disease. No acute abnormality  seen in the cervical spine. Electronically Signed   By: Marijo Conception, M.D.   On: 08/19/2018 21:55   Ct Cervical Spine Wo Contrast  Result Date: 08/19/2018 CLINICAL DATA:  Fall at home. EXAM: CT HEAD WITHOUT CONTRAST CT CERVICAL  SPINE WITHOUT CONTRAST TECHNIQUE: Multidetector CT imaging of the head and cervical spine was performed following the standard protocol without intravenous contrast. Multiplanar CT image reconstructions of the cervical spine were also generated. COMPARISON:  None. FINDINGS: CT HEAD FINDINGS Brain: Mild diffuse cortical atrophy is noted. Mild chronic ischemic white matter disease is noted. No mass effect or midline shift is noted. Ventricular size is within normal limits. There is no evidence of mass lesion, hemorrhage or acute infarction. Vascular: No hyperdense vessel or unexpected calcification. Skull: Normal. Negative for fracture or focal lesion. Sinuses/Orbits: No acute finding. Other: Large right posterior scalp hematoma is noted. CT CERVICAL SPINE FINDINGS Alignment: Grade 1 anterolisthesis of C4-5 is noted secondary to posterior facet joint hypertrophy. Skull base and vertebrae: No acute fracture. No primary bone lesion or focal pathologic process. Soft tissues and spinal canal: No prevertebral fluid or swelling. No visible canal hematoma. Disc levels: Severe degenerative disc disease is noted at C4-5 and C5-6 with anterior osteophyte formation. Upper chest: Negative. Other: Degenerative changes are seen involving the posterior facet joints bilaterally. IMPRESSION: Mild diffuse cortical atrophy. Mild chronic ischemic white matter disease. Large right posterior scalp hematoma. No acute intracranial abnormality seen. Severe multilevel degenerative disc disease. No acute abnormality seen in the cervical spine. Electronically Signed   By: Marijo Conception, M.D.   On: 08/19/2018 21:55   Dg Hip Unilat W Or Wo Pelvis 2-3 Views Right  Result Date: 08/19/2018 CLINICAL DATA:  [place].   Right hip pain. EXAM: DG HIP (WITH OR WITHOUT PELVIS) 2-3V RIGHT COMPARISON:  06/22/2018 FINDINGS: Stable surgical changes from left hip fracture fixation with a gamma nail and compression screw. Progressive healing changes. Advanced left hip joint degenerative changes. There is a displaced intertrochanteric fracture of the right hip with a marked varus deformity. The pubic symphysis and SI joints are intact. No pelvic fractures. IMPRESSION: New/acute displaced intertrochanteric fracture of the right hip. Remote healed/healing left hip fracture. No definite pelvic fractures. Electronically Signed   By: Marijo Sanes M.D.   On: 08/19/2018 21:58    Procedures Procedures (including critical care time)  Medications Ordered in ED Medications  HYDROmorphone (DILAUDID) injection 1 mg (has no administration in time range)  morphine 4 MG/ML injection 4 mg (4 mg Intravenous Given 08/19/18 2109)  ondansetron (ZOFRAN) injection 4 mg (4 mg Intravenous Given 08/19/18 2108)  iopamidol (ISOVUE-370) 76 % injection 100 mL (100 mLs Intravenous Contrast Given 08/19/18 2129)     Initial Impression / Assessment and Plan / ED Course  I have reviewed the triage vital signs and the nursing notes.  Pertinent labs & imaging results that were available during my care of the patient were reviewed by me and considered in my medical decision making (see chart for details).    KARALINE BURESH is a 72 y.o. female here with fall. She had head injury and had R hip pain. I am concerned for possible hip fracture. She is not on blood thinners. Will get preop labs, CT head/neck, R hip xrays.   10:15 PM Xrays showed R intertrochanteric fracture. Dr. Ninfa Linden consulted and will arrange for surgery tomorrow. Hospitalist to admit.    Final Clinical Impressions(s) / ED Diagnoses   Final diagnoses:  None    ED Discharge Orders    None       Drenda Freeze, MD 08/19/18 2216

## 2018-08-19 NOTE — ED Notes (Signed)
Placed pt on PW. 

## 2018-08-20 ENCOUNTER — Inpatient Hospital Stay (HOSPITAL_COMMUNITY): Payer: Medicare Other | Admitting: Anesthesiology

## 2018-08-20 ENCOUNTER — Encounter (HOSPITAL_COMMUNITY)
Admission: EM | Disposition: A | Discharge status: Skilled Nursing Facility | Payer: Self-pay | Source: Home / Self Care | Attending: Internal Medicine

## 2018-08-20 ENCOUNTER — Inpatient Hospital Stay (HOSPITAL_COMMUNITY): Payer: Medicare Other

## 2018-08-20 ENCOUNTER — Encounter (HOSPITAL_COMMUNITY): Payer: Self-pay | Admitting: Certified Registered Nurse Anesthetist

## 2018-08-20 DIAGNOSIS — S72141A Displaced intertrochanteric fracture of right femur, initial encounter for closed fracture: Principal | ICD-10-CM

## 2018-08-20 HISTORY — PX: INTRAMEDULLARY (IM) NAIL INTERTROCHANTERIC: SHX5875

## 2018-08-20 HISTORY — PX: ORIF HIP FRACTURE: SHX2125

## 2018-08-20 LAB — CBC
HCT: 35 % — ABNORMAL LOW (ref 36.0–46.0)
Hemoglobin: 11.8 g/dL — ABNORMAL LOW (ref 12.0–15.0)
MCH: 28.8 pg (ref 26.0–34.0)
MCHC: 33.7 g/dL (ref 30.0–36.0)
MCV: 85.4 fL (ref 80.0–100.0)
PLATELETS: 209 10*3/uL (ref 150–400)
RBC: 4.1 MIL/uL (ref 3.87–5.11)
RDW: 14.2 % (ref 11.5–15.5)
WBC: 11.6 10*3/uL — ABNORMAL HIGH (ref 4.0–10.5)
nRBC: 0 % (ref 0.0–0.2)

## 2018-08-20 LAB — TYPE AND SCREEN
ABO/RH(D): A POS
ANTIBODY SCREEN: NEGATIVE

## 2018-08-20 LAB — BASIC METABOLIC PANEL
Anion gap: 10 (ref 5–15)
BUN: 22 mg/dL (ref 8–23)
CO2: 21 mmol/L — ABNORMAL LOW (ref 22–32)
Calcium: 9.4 mg/dL (ref 8.9–10.3)
Chloride: 107 mmol/L (ref 98–111)
Creatinine, Ser: 1.25 mg/dL — ABNORMAL HIGH (ref 0.44–1.00)
GFR calc Af Amer: 49 mL/min — ABNORMAL LOW (ref >60)
GFR, EST NON AFRICAN AMERICAN: 42 mL/min — AB (ref >60)
GLUCOSE: 406 mg/dL — AB (ref 70–99)
POTASSIUM: 4 mmol/L (ref 3.5–5.1)
SODIUM: 138 mmol/L (ref 135–145)

## 2018-08-20 LAB — GLUCOSE, CAPILLARY
GLUCOSE-CAPILLARY: 309 mg/dL — AB (ref 70–99)
GLUCOSE-CAPILLARY: 246 mg/dL — AB (ref 70–99)
GLUCOSE-CAPILLARY: 233 mg/dL — AB (ref 70–99)
Glucose-Capillary: 279 mg/dL — ABNORMAL HIGH (ref 70–99)
Glucose-Capillary: 256 mg/dL — ABNORMAL HIGH (ref 70–99)
Glucose-Capillary: 259 mg/dL — ABNORMAL HIGH (ref 70–99)
Glucose-Capillary: 244 mg/dL — ABNORMAL HIGH (ref 70–99)

## 2018-08-20 LAB — SURGICAL PCR SCREEN
MRSA, PCR: NEGATIVE
Staphylococcus aureus: NEGATIVE

## 2018-08-20 LAB — HEMOGLOBIN A1C
Hgb A1c MFr Bld: 8.7 % — ABNORMAL HIGH (ref 4.8–5.6)
MEAN PLASMA GLUCOSE: 202.99 mg/dL

## 2018-08-20 LAB — ALBUMIN: ALBUMIN: 3.1 g/dL — AB (ref 3.5–5.0)

## 2018-08-20 SURGERY — FIXATION, FRACTURE, INTERTROCHANTERIC, WITH INTRAMEDULLARY ROD
Anesthesia: Spinal | Site: Leg Upper | Laterality: Right

## 2018-08-20 MED ORDER — INSULIN ASPART 100 UNIT/ML ~~LOC~~ SOLN
SUBCUTANEOUS | Status: AC
  Filled 2018-08-20: qty 1

## 2018-08-20 MED ORDER — SODIUM CHLORIDE 0.9 % IV SOLN
INTRAVENOUS | Status: DC
  Administered 2018-08-20 – 2018-08-22 (×2): via INTRAVENOUS

## 2018-08-20 MED ORDER — PANTOPRAZOLE SODIUM 40 MG PO TBEC
40.0000 mg | DELAYED_RELEASE_TABLET | Freq: Every day | ORAL | Status: DC
  Administered 2018-08-20 – 2018-08-22 (×3): 40 mg via ORAL
  Filled 2018-08-20 (×3): qty 1

## 2018-08-20 MED ORDER — METHOCARBAMOL 1000 MG/10ML IJ SOLN
500.0000 mg | Freq: Four times a day (QID) | INTRAVENOUS | Status: DC | PRN
  Filled 2018-08-20: qty 5

## 2018-08-20 MED ORDER — 0.9 % SODIUM CHLORIDE (POUR BTL) OPTIME
TOPICAL | Status: DC | PRN
  Administered 2018-08-20: 1000 mL

## 2018-08-20 MED ORDER — INSULIN ASPART 100 UNIT/ML ~~LOC~~ SOLN
3.0000 [IU] | Freq: Once | SUBCUTANEOUS | Status: AC
  Administered 2018-08-20: 3 [IU] via INTRAVENOUS

## 2018-08-20 MED ORDER — PHENOL 1.4 % MT LIQD: 1.0000 | OROMUCOSAL | Status: DC | PRN

## 2018-08-20 MED ORDER — ONDANSETRON HCL 4 MG/2ML IJ SOLN: 4.0000 mg | Freq: Once | INTRAMUSCULAR | Status: DC | PRN

## 2018-08-20 MED ORDER — POLYETHYLENE GLYCOL 3350 17 G PO PACK: 17.0000 g | PACK | Freq: Every day | ORAL | Status: DC | PRN

## 2018-08-20 MED ORDER — ONDANSETRON HCL 4 MG PO TABS: 4.0000 mg | ORAL_TABLET | Freq: Four times a day (QID) | ORAL | Status: DC | PRN

## 2018-08-20 MED ORDER — METHOCARBAMOL 500 MG PO TABS
500.0000 mg | ORAL_TABLET | Freq: Four times a day (QID) | ORAL | Status: DC | PRN
  Administered 2018-08-21 – 2018-08-22 (×3): 500 mg via ORAL
  Filled 2018-08-20 (×3): qty 1

## 2018-08-20 MED ORDER — ALUM & MAG HYDROXIDE-SIMETH 200-200-20 MG/5ML PO SUSP: 30.0000 mL | ORAL | Status: DC | PRN

## 2018-08-20 MED ORDER — BUPIVACAINE IN DEXTROSE 0.75-8.25 % IT SOLN
INTRATHECAL | Status: DC | PRN
  Administered 2018-08-20: 1.4 mL via INTRATHECAL

## 2018-08-20 MED ORDER — KETAMINE HCL 50 MG/5ML IJ SOSY
PREFILLED_SYRINGE | INTRAMUSCULAR | Status: AC
  Filled 2018-08-20: qty 5

## 2018-08-20 MED ORDER — MENTHOL 3 MG MT LOZG: 1.0000 | LOZENGE | OROMUCOSAL | Status: DC | PRN

## 2018-08-20 MED ORDER — ALBUMIN HUMAN 5 % IV SOLN
INTRAVENOUS | Status: DC | PRN
  Administered 2018-08-20: 13:00:00 via INTRAVENOUS

## 2018-08-20 MED ORDER — SODIUM CHLORIDE 0.9 % IV SOLN
INTRAVENOUS | Status: DC | PRN
  Administered 2018-08-20: 30 ug/min via INTRAVENOUS

## 2018-08-20 MED ORDER — PROPOFOL 10 MG/ML IV BOLUS
INTRAVENOUS | Status: DC | PRN
  Administered 2018-08-20: 20 mg via INTRAVENOUS

## 2018-08-20 MED ORDER — HYDROCODONE-ACETAMINOPHEN 5-325 MG PO TABS
1.0000 | ORAL_TABLET | Freq: Four times a day (QID) | ORAL | Status: DC | PRN
  Administered 2018-08-20: 1 via ORAL
  Administered 2018-08-20 – 2018-08-22 (×5): 2 via ORAL
  Filled 2018-08-20: qty 2
  Filled 2018-08-20: qty 1
  Filled 2018-08-20 (×4): qty 2

## 2018-08-20 MED ORDER — OXYCODONE HCL 5 MG PO TABS: 10.0000 mg | ORAL_TABLET | ORAL | Status: DC | PRN

## 2018-08-20 MED ORDER — PROPOFOL 500 MG/50ML IV EMUL
INTRAVENOUS | Status: DC | PRN
  Administered 2018-08-20: 50 ug/kg/min via INTRAVENOUS

## 2018-08-20 MED ORDER — DOCUSATE SODIUM 100 MG PO CAPS
100.0000 mg | ORAL_CAPSULE | Freq: Two times a day (BID) | ORAL | Status: DC
  Administered 2018-08-20 – 2018-08-22 (×5): 100 mg via ORAL
  Filled 2018-08-20 (×5): qty 1

## 2018-08-20 MED ORDER — CLINDAMYCIN PHOSPHATE 600 MG/50ML IV SOLN
600.0000 mg | Freq: Four times a day (QID) | INTRAVENOUS | Status: AC
  Administered 2018-08-20 (×2): 600 mg via INTRAVENOUS
  Filled 2018-08-20 (×2): qty 50

## 2018-08-20 MED ORDER — ALBUMIN HUMAN 5 % IV SOLN
INTRAVENOUS | Status: AC
  Filled 2018-08-20: qty 250

## 2018-08-20 MED ORDER — LEVOTHYROXINE SODIUM 25 MCG PO TABS
137.0000 ug | ORAL_TABLET | Freq: Every day | ORAL | Status: DC
  Administered 2018-08-20 – 2018-08-22 (×3): 137 ug via ORAL
  Filled 2018-08-20 (×3): qty 1

## 2018-08-20 MED ORDER — FENTANYL CITRATE (PF) 100 MCG/2ML IJ SOLN: 25.0000 ug | INTRAMUSCULAR | Status: DC | PRN

## 2018-08-20 MED ORDER — ONDANSETRON HCL 4 MG/2ML IJ SOLN
INTRAMUSCULAR | Status: DC | PRN
  Administered 2018-08-20: 4 mg via INTRAVENOUS

## 2018-08-20 MED ORDER — METHOCARBAMOL 500 MG PO TABS
500.0000 mg | ORAL_TABLET | Freq: Four times a day (QID) | ORAL | Status: DC | PRN
  Administered 2018-08-20: 500 mg via ORAL
  Filled 2018-08-20: qty 1

## 2018-08-20 MED ORDER — KETAMINE HCL 10 MG/ML IJ SOLN
INTRAMUSCULAR | Status: DC | PRN
  Administered 2018-08-20: 20 mg via INTRAVENOUS

## 2018-08-20 MED ORDER — PROPOFOL 500 MG/50ML IV EMUL
INTRAVENOUS | Status: AC
  Filled 2018-08-20: qty 50

## 2018-08-20 MED ORDER — INSULIN ASPART 100 UNIT/ML ~~LOC~~ SOLN
0.0000 [IU] | SUBCUTANEOUS | Status: DC
  Administered 2018-08-20 (×2): 5 [IU] via SUBCUTANEOUS
  Administered 2018-08-21 (×3): 3 [IU] via SUBCUTANEOUS
  Administered 2018-08-21 (×3): 5 [IU] via SUBCUTANEOUS
  Administered 2018-08-22: 3 [IU] via SUBCUTANEOUS
  Administered 2018-08-22: 2 [IU] via SUBCUTANEOUS
  Administered 2018-08-22 (×2): 3 [IU] via SUBCUTANEOUS
  Administered 2018-08-22: 2 [IU] via SUBCUTANEOUS
  Filled 2018-08-20: qty 1

## 2018-08-20 MED ORDER — HYDROMORPHONE HCL 1 MG/ML IJ SOLN
0.5000 mg | INTRAMUSCULAR | Status: DC | PRN
  Administered 2018-08-22: 0.5 mg via INTRAVENOUS
  Filled 2018-08-20: qty 1

## 2018-08-20 MED ORDER — LACTATED RINGERS IV SOLN
INTRAVENOUS | Status: DC
  Administered 2018-08-20: 11:00:00 via INTRAVENOUS

## 2018-08-20 MED ORDER — METOCLOPRAMIDE HCL 5 MG PO TABS: 5.0000 mg | ORAL_TABLET | Freq: Three times a day (TID) | ORAL | Status: DC | PRN

## 2018-08-20 MED ORDER — INSULIN ASPART 100 UNIT/ML IV SOLN
3.0000 [IU] | Freq: Once | INTRAVENOUS | Status: AC
  Administered 2018-08-20: 3 [IU] via INTRAVENOUS

## 2018-08-20 MED ORDER — MORPHINE SULFATE (PF) 2 MG/ML IV SOLN
0.5000 mg | INTRAVENOUS | Status: DC | PRN
  Administered 2018-08-20: 0.5 mg via INTRAVENOUS
  Filled 2018-08-20: qty 1

## 2018-08-20 MED ORDER — CLINDAMYCIN PHOSPHATE 900 MG/50ML IV SOLN
INTRAVENOUS | Status: DC | PRN
  Administered 2018-08-20: 900 mg via INTRAVENOUS

## 2018-08-20 MED ORDER — ASPIRIN EC 325 MG PO TBEC
325.0000 mg | DELAYED_RELEASE_TABLET | Freq: Every day | ORAL | Status: DC
  Administered 2018-08-21 – 2018-08-22 (×2): 325 mg via ORAL
  Filled 2018-08-20 (×2): qty 1

## 2018-08-20 MED ORDER — BISACODYL 10 MG RE SUPP: 10.0000 mg | Freq: Every day | RECTAL | Status: DC | PRN

## 2018-08-20 MED ORDER — CLINDAMYCIN PHOSPHATE 900 MG/50ML IV SOLN
INTRAVENOUS | Status: AC
  Filled 2018-08-20: qty 50

## 2018-08-20 MED ORDER — METOCLOPRAMIDE HCL 5 MG/ML IJ SOLN: 5.0000 mg | Freq: Three times a day (TID) | INTRAMUSCULAR | Status: DC | PRN

## 2018-08-20 MED ORDER — METOPROLOL TARTRATE 100 MG PO TABS
100.0000 mg | ORAL_TABLET | Freq: Two times a day (BID) | ORAL | Status: DC
  Administered 2018-08-20 – 2018-08-22 (×6): 100 mg via ORAL
  Filled 2018-08-20 (×6): qty 1

## 2018-08-20 MED ORDER — OXYCODONE HCL 5 MG PO TABS
10.0000 mg | ORAL_TABLET | ORAL | Status: DC | PRN
  Administered 2018-08-21 – 2018-08-22 (×3): 10 mg via ORAL
  Filled 2018-08-20: qty 2

## 2018-08-20 MED ORDER — OXYCODONE HCL 5 MG PO TABS
5.0000 mg | ORAL_TABLET | ORAL | Status: DC | PRN
  Administered 2018-08-20: 5 mg via ORAL
  Administered 2018-08-22: 10 mg via ORAL
  Filled 2018-08-20 (×2): qty 2
  Filled 2018-08-20: qty 1
  Filled 2018-08-20: qty 2

## 2018-08-20 MED ORDER — FENTANYL CITRATE (PF) 250 MCG/5ML IJ SOLN
INTRAMUSCULAR | Status: AC
  Filled 2018-08-20: qty 5

## 2018-08-20 MED ORDER — ACETAMINOPHEN 325 MG PO TABS
325.0000 mg | ORAL_TABLET | Freq: Four times a day (QID) | ORAL | Status: DC | PRN
  Filled 2018-08-20: qty 2

## 2018-08-20 MED ORDER — ONDANSETRON HCL 4 MG/2ML IJ SOLN: 4.0000 mg | Freq: Four times a day (QID) | INTRAMUSCULAR | Status: DC | PRN

## 2018-08-20 SURGICAL SUPPLY — 48 items
BLADE SURG 15 STRL LF DISP TIS (BLADE) ×1 IMPLANT
BLADE SURG 15 STRL SS (BLADE) ×3
BNDG GAUZE ELAST 4 BULKY (GAUZE/BANDAGES/DRESSINGS) ×3 IMPLANT
COVER PERINEAL POST (MISCELLANEOUS) ×3 IMPLANT
COVER SURGICAL LIGHT HANDLE (MISCELLANEOUS) ×3 IMPLANT
COVER WAND RF STERILE (DRAPES) ×3 IMPLANT
DRAPE STERI IOBAN 125X83 (DRAPES) ×3 IMPLANT
DRESSING AQUACEL AG SP 3.5X10 (GAUZE/BANDAGES/DRESSINGS) IMPLANT
DRSG AQUACEL AG SP 3.5X10 (GAUZE/BANDAGES/DRESSINGS) ×3
DRSG MEPILEX BORDER 4X4 (GAUZE/BANDAGES/DRESSINGS) IMPLANT
DRSG MEPILEX BORDER 4X8 (GAUZE/BANDAGES/DRESSINGS) ×3 IMPLANT
DRSG PAD ABDOMINAL 8X10 ST (GAUZE/BANDAGES/DRESSINGS) ×6 IMPLANT
DURAPREP 26ML APPLICATOR (WOUND CARE) ×3 IMPLANT
ELECT REM PT RETURN 9FT ADLT (ELECTROSURGICAL) ×3
ELECTRODE REM PT RTRN 9FT ADLT (ELECTROSURGICAL) ×1 IMPLANT
FACESHIELD WRAPAROUND (MASK) ×3 IMPLANT
FACESHIELD WRAPAROUND OR TEAM (MASK) ×1 IMPLANT
GAUZE XEROFORM 5X9 LF (GAUZE/BANDAGES/DRESSINGS) ×3 IMPLANT
GLOVE BIO SURGEON STRL SZ8 (GLOVE) ×3 IMPLANT
GLOVE BIOGEL PI IND STRL 8 (GLOVE) ×1 IMPLANT
GLOVE BIOGEL PI INDICATOR 8 (GLOVE) ×2
GLOVE ORTHO TXT STRL SZ7.5 (GLOVE) ×3 IMPLANT
GOWN STRL REUS W/ TWL LRG LVL3 (GOWN DISPOSABLE) ×1 IMPLANT
GOWN STRL REUS W/ TWL XL LVL3 (GOWN DISPOSABLE) ×2 IMPLANT
GOWN STRL REUS W/TWL LRG LVL3 (GOWN DISPOSABLE) ×3
GOWN STRL REUS W/TWL XL LVL3 (GOWN DISPOSABLE) ×6
GUIDE PIN 3.2X343 (PIN) ×2
GUIDE PIN 3.2X343MM (PIN) ×6
KIT BASIN OR (CUSTOM PROCEDURE TRAY) ×3 IMPLANT
KIT TURNOVER KIT B (KITS) ×3 IMPLANT
LINER BOOT UNIVERSAL DISP (MISCELLANEOUS) ×3 IMPLANT
MANIFOLD NEPTUNE II (INSTRUMENTS) ×3 IMPLANT
NAIL TRIGEN 10MMX36CM-125 RT (Nail) ×2 IMPLANT
NS IRRIG 1000ML POUR BTL (IV SOLUTION) ×3 IMPLANT
PACK GENERAL/GYN (CUSTOM PROCEDURE TRAY) ×3 IMPLANT
PAD ARMBOARD 7.5X6 YLW CONV (MISCELLANEOUS) ×6 IMPLANT
PAD CAST 4YDX4 CTTN HI CHSV (CAST SUPPLIES) ×2 IMPLANT
PADDING CAST COTTON 4X4 STRL (CAST SUPPLIES) ×6
PIN GUIDE 3.2X343MM (PIN) IMPLANT
SCREW LAG COMPR KIT 95/90 (Screw) ×2 IMPLANT
STAPLER VISISTAT 35W (STAPLE) ×3 IMPLANT
SUT VIC AB 0 CT1 27 (SUTURE) ×6
SUT VIC AB 0 CT1 27XBRD ANBCTR (SUTURE) ×2 IMPLANT
SUT VIC AB 2-0 CT1 27 (SUTURE) ×6
SUT VIC AB 2-0 CT1 TAPERPNT 27 (SUTURE) ×2 IMPLANT
TOWEL OR 17X24 6PK STRL BLUE (TOWEL DISPOSABLE) ×3 IMPLANT
TOWEL OR 17X26 10 PK STRL BLUE (TOWEL DISPOSABLE) ×3 IMPLANT
WATER STERILE IRR 1000ML POUR (IV SOLUTION) ×3 IMPLANT

## 2018-08-20 NOTE — Transfer of Care (Signed)
Immediate Anesthesia Transfer of Care Note  Patient: Meredith Shaw  Procedure(s) Performed: ORIF RIGHT HIP INTERTROC FRACTURE (Right Leg Upper)  Patient Location: PACU  Anesthesia Type:Spinal and MAC combined with regional for post-op pain  Level of Consciousness: awake, alert , oriented and patient cooperative  Airway & Oxygen Therapy: Patient Spontanous Breathing  Post-op Assessment: Report given to RN, Post -op Vital signs reviewed and stable and Patient moving all extremities X 4  Post vital signs: Reviewed and stable  Last Vitals:  Vitals Value Taken Time  BP 89/52 08/20/2018 12:49 PM  Temp    Pulse 53 08/20/2018 12:50 PM  Resp 12 08/20/2018 12:50 PM  SpO2 96 % 08/20/2018 12:50 PM  Vitals shown include unvalidated device data.  Last Pain:  Vitals:   08/20/18 0509  TempSrc:   PainSc: 0-No pain         Complications: No apparent anesthesia complications   Dr. Gifford Shave aware of hypotension and patient on Neo drip in PACU.

## 2018-08-20 NOTE — Anesthesia Postprocedure Evaluation (Signed)
Anesthesia Post Note  Patient: Meredith Shaw  Procedure(s) Performed: ORIF RIGHT HIP INTERTROC FRACTURE (Right Leg Upper)     Patient location during evaluation: PACU Anesthesia Type: Spinal Level of consciousness: oriented, awake and alert and awake Pain management: pain level controlled Vital Signs Assessment: post-procedure vital signs reviewed and stable Respiratory status: spontaneous breathing, respiratory function stable and patient connected to nasal cannula oxygen Cardiovascular status: blood pressure returned to baseline and stable Postop Assessment: no headache, no backache, no apparent nausea or vomiting, patient able to bend at knees and spinal receding Anesthetic complications: no    Last Vitals:  Vitals:   08/20/18 1410 08/20/18 1511  BP: (!) 156/88 (!) 151/70  Pulse: 69 65  Resp:  16  Temp:  36.7 C  SpO2: 96% 92%    Last Pain:  Vitals:   08/20/18 1517  TempSrc:   PainSc: 4                  Cecile Hearing

## 2018-08-20 NOTE — Consult Note (Signed)
Reason for Consult: Right hip fracture Referring Physician: Dr. Darl Householder, EDP  Meredith Shaw is an 72 y.o. female.  HPI: The patient is a 72 year old female actually well-known to me.  I had to fix a left hip fracture on her in May of this year.  She is also had a previous right total knee arthroplasty.  Last evening she was getting back from dinner her husband's open the door for her and the next thing she knows she was on the floor.  She was transported to [facility] emergency room and found to have a right hip intertrochanteric fracture.  Orthopedic surgery was consulted.  She was graciously admitted to the medicine service.  She was worked up for her fall.  She does have bruising and a contusion to her head.  She is awake and alert this morning.  She does report right hip pain.  Having had similar surgery before on her other side she is aware of what the plan will be for her right hip today.  Past Medical History:  Diagnosis Date  . Arthritis    OA AND PAIN BOTH KNEES -RIGHT KNEE HURTS WORSE THAN LEFT  . Cataract   . Diabetes mellitus    ORAL MEDICATION - NO INSULIN  . Hypertension   . Hypothyroidism   . Kidney stones    NONE AT PRESENT TIME THAT PT IS AWARE OF  . Osteoporosis     Past Surgical History:  Procedure Laterality Date  . ABDOMINAL HYSTERECTOMY    . CHOLECYSTECTOMY  1972  . COLONOSCOPY    . EYE SURGERY     RIGHT CATARACT EXTRACTON WITH LENS IMPLANT  . GROWTH REMOVED FROM THYROID    . INTRAMEDULLARY (IM) NAIL INTERTROCHANTERIC Left 02/10/2018   Procedure: INTRAMEDULLARY (IM) NAIL INTERTROCHANTRIC;  Surgeon: Mcarthur Rossetti, MD;  Location: [place];  Service: Orthopedics;  Laterality: Left;  . left hip surgery   02/2018  . PERCUTANEOUS NEPHROLITHOTOMY    . TOTAL KNEE ARTHROPLASTY  06/09/2012   Procedure: TOTAL KNEE ARTHROPLASTY;  Surgeon: Mcarthur Rossetti, MD;  Location: [facility];  Service: Orthopedics;  Laterality: Right;  Right Total Knee Arthroplasty  . TOTAL  KNEE ARTHROPLASTY Left 04/09/2014   Procedure: LEFT TOTAL KNEE ARTHROPLASTY;  Surgeon: Mcarthur Rossetti, MD;  Location: [facility];  Service: Orthopedics;  Laterality: Left;  . TUBAL LIGATION      Family History  Problem Relation Age of Onset  . Heart disease Mother   . Breast cancer Mother        49's  . Stroke Father   . Heart disease Father   . Healthy Daughter   . Healthy Son   . Liver disease Maternal Grandmother        gall stones imbedded in liver - blead to death when in surgery   . Pneumonia Maternal Grandfather   . Diabetes Paternal Grandmother     Social History:  reports that she has never smoked. She has never used smokeless tobacco. She reports that she does not drink alcohol or use drugs.  Allergies:  Allergies  Allergen Reactions  . Penicillins Itching and Rash    Medications: I have reviewed the patient's current medications.  Results for orders placed or performed during the hospital encounter of 08/19/18 (from the past 48 hour(s))  CBC with Differential/Platelet     Status: Abnormal   Collection Time: 08/19/18  9:05 PM  Result Value Ref Range   WBC 13.4 (H) 4.0 - 10.5 K/uL  RBC 4.81 3.87 - 5.11 MIL/uL   Hemoglobin 13.8 12.0 - 15.0 g/dL   HCT 41.9 36.0 - 46.0 %   MCV 87.1 80.0 - 100.0 fL   MCH 28.7 26.0 - 34.0 pg   MCHC 32.9 30.0 - 36.0 g/dL   RDW 14.3 11.5 - 15.5 %   Platelets 236 150 - 400 K/uL   nRBC 0.0 0.0 - 0.2 %   Neutrophils Relative % 84 %   Neutro Abs 11.1 (H) 1.7 - 7.7 K/uL   Lymphocytes Relative 11 %   Lymphs Abs 1.4 0.7 - 4.0 K/uL   Monocytes Relative 5 %   Monocytes Absolute 0.6 0.1 - 1.0 K/uL   Eosinophils Relative 0 %   Eosinophils Absolute 0.1 0.0 - 0.5 K/uL   Basophils Relative 0 %   Basophils Absolute 0.1 0.0 - 0.1 K/uL   Immature Granulocytes 0 %   Abs Immature Granulocytes 0.06 0.00 - 0.07 K/uL    Comment: Performed at [facility] [address], [place], [place] [address]  Comprehensive metabolic panel      Status: Abnormal   Collection Time: 08/19/18  9:05 PM  Result Value Ref Range   Sodium 139 135 - 145 mmol/L   Potassium 3.7 3.5 - 5.1 mmol/L   Chloride 105 98 - 111 mmol/L   CO2 21 (L) 22 - 32 mmol/L   Glucose, Bld 365 (H) 70 - 99 mg/dL   BUN 25 (H) 8 - 23 mg/dL   Creatinine, Ser 1.30 (H) 0.44 - 1.00 mg/dL   Calcium 9.9 8.9 - 10.3 mg/dL   Total Protein 7.2 6.5 - 8.1 g/dL   Albumin 3.8 3.5 - 5.0 g/dL   AST 21 15 - 41 U/L   ALT 19 0 - 44 U/L   Alkaline Phosphatase 81 38 - 126 U/L   Total Bilirubin 0.3 0.3 - 1.2 mg/dL   GFR calc non Af Amer 40 (L) >60 mL/min   GFR calc Af Amer 46 (L) >60 mL/min    Comment: (NOTE) The eGFR has been calculated using the CKD EPI equation. This calculation has not been validated in all clinical situations. eGFR's persistently <60 mL/min signify possible Chronic Kidney Disease.    Anion gap 13 5 - 15    Comment: Performed at [facility] [address], [place], [place] [address]  I-stat troponin, ED     Status: None   Collection Time: 08/19/18  9:14 PM  Result Value Ref Range   Troponin i, poc 0.01 0.00 - 0.08 ng/mL   Comment 3            Comment: Due to the release kinetics of cTnI, a negative result within the first hours of the onset of symptoms does not rule out myocardial infarction with certainty. If myocardial infarction is still suspected, repeat the test at appropriate intervals.   Type and screen     Status: None   Collection Time: 08/20/18 12:40 AM  Result Value Ref Range   ABO/RH(D) A POS    Antibody Screen NEG    Sample Expiration      08/23/2018 Performed at [facility], [place] [address] [place] [address]   CBC     Status: Abnormal   Collection Time: 08/20/18  3:48 AM  Result Value Ref Range   WBC 11.6 (H) 4.0 - 10.5 K/uL   RBC 4.10 3.87 - 5.11 MIL/uL   Hemoglobin 11.8 (L) 12.0 - 15.0 g/dL  HCT 35.0 (L) 36.0 - 46.0 %   MCV 85.4 80.0 - 100.0 fL   MCH 28.8 26.0 - 34.0 pg   MCHC 33.7 30.0 - 36.0 g/dL    RDW 14.2 11.5 - 15.5 %   Platelets 209 150 - 400 K/uL   nRBC 0.0 0.0 - 0.2 %    Comment: Performed at [facility] [address], [place], [place] [address]  Basic metabolic panel     Status: Abnormal   Collection Time: 08/20/18  3:48 AM  Result Value Ref Range   Sodium 138 135 - 145 mmol/L   Potassium 4.0 3.5 - 5.1 mmol/L   Chloride 107 98 - 111 mmol/L   CO2 21 (L) 22 - 32 mmol/L   Glucose, Bld 406 (H) 70 - 99 mg/dL   BUN 22 8 - 23 mg/dL   Creatinine, Ser 1.25 (H) 0.44 - 1.00 mg/dL   Calcium 9.4 8.9 - 10.3 mg/dL   GFR calc non Af Amer 42 (L) >60 mL/min   GFR calc Af Amer 49 (L) >60 mL/min    Comment: (NOTE) The eGFR has been calculated using the CKD EPI equation. This calculation has not been validated in all clinical situations. eGFR's persistently <60 mL/min signify possible Chronic Kidney Disease.    Anion gap 10 5 - 15    Comment: Performed at [facility] [address] [place], [place] [address]  Albumin     Status: Abnormal   Collection Time: 08/20/18  3:48 AM  Result Value Ref Range   Albumin 3.1 (L) 3.5 - 5.0 g/dL    Comment: Performed at [facility] [address] [place], [place] [address]    Dg Chest 1 View  Result Date: 08/19/2018 CLINICAL DATA:  [place].  Right hip fracture. EXAM: CHEST  1 VIEW COMPARISON:  02/09/2018 FINDINGS: The heart is enlarged but stable. There is tortuosity and mild ectasia of the thoracic aorta. Prominent mediastinal contours due to the supine position the patient and the AP projection of the film. The lungs are clear. No pneumothorax. Stable eventration right hemidiaphragm. The bony thorax is intact. IMPRESSION: No acute cardiopulmonary findings. Stable cardiac enlargement and prominent mediastinal contours due to the supine position the patient and the AP projection of the film. Electronically Signed   By: Marijo Sanes M.D.   On: 08/19/2018 21:59   Ct Head Wo Contrast  Result Date: 08/19/2018 CLINICAL DATA:   Fall at home. EXAM: CT HEAD WITHOUT CONTRAST CT CERVICAL SPINE WITHOUT CONTRAST TECHNIQUE: Multidetector CT imaging of the head and cervical spine was performed following the standard protocol without intravenous contrast. Multiplanar CT image reconstructions of the cervical spine were also generated. COMPARISON:  None. FINDINGS: CT HEAD FINDINGS Brain: Mild diffuse cortical atrophy is noted. Mild chronic ischemic white matter disease is noted. No mass effect or midline shift is noted. Ventricular size is within normal limits. There is no evidence of mass lesion, hemorrhage or acute infarction. Vascular: No hyperdense vessel or unexpected calcification. Skull: Normal. Negative for fracture or focal lesion. Sinuses/Orbits: No acute finding. Other: Large right posterior scalp hematoma is noted. CT CERVICAL SPINE FINDINGS Alignment: Grade 1 anterolisthesis of C4-5 is noted secondary to posterior facet joint hypertrophy. Skull base and vertebrae: No acute fracture. No primary bone lesion or focal pathologic process. Soft tissues and spinal canal: No prevertebral fluid or swelling. No visible canal hematoma. Disc levels: Severe degenerative disc disease is noted at C4-5 and C5-6 with  anterior osteophyte formation. Upper chest: Negative. Other: Degenerative changes are seen involving the posterior facet joints bilaterally. IMPRESSION: Mild diffuse cortical atrophy. Mild chronic ischemic white matter disease. Large right posterior scalp hematoma. No acute intracranial abnormality seen. Severe multilevel degenerative disc disease. No acute abnormality seen in the cervical spine. Electronically Signed   By: Marijo Conception, M.D.   On: 08/19/2018 21:55   Ct Cervical Spine Wo Contrast  Result Date: 08/19/2018 CLINICAL DATA:  Fall at home. EXAM: CT HEAD WITHOUT CONTRAST CT CERVICAL SPINE WITHOUT CONTRAST TECHNIQUE: Multidetector CT imaging of the head and cervical spine was performed following the standard protocol without  intravenous contrast. Multiplanar CT image reconstructions of the cervical spine were also generated. COMPARISON:  None. FINDINGS: CT HEAD FINDINGS Brain: Mild diffuse cortical atrophy is noted. Mild chronic ischemic white matter disease is noted. No mass effect or midline shift is noted. Ventricular size is within normal limits. There is no evidence of mass lesion, hemorrhage or acute infarction. Vascular: No hyperdense vessel or unexpected calcification. Skull: Normal. Negative for fracture or focal lesion. Sinuses/Orbits: No acute finding. Other: Large right posterior scalp hematoma is noted. CT CERVICAL SPINE FINDINGS Alignment: Grade 1 anterolisthesis of C4-5 is noted secondary to posterior facet joint hypertrophy. Skull base and vertebrae: No acute fracture. No primary bone lesion or focal pathologic process. Soft tissues and spinal canal: No prevertebral fluid or swelling. No visible canal hematoma. Disc levels: Severe degenerative disc disease is noted at C4-5 and C5-6 with anterior osteophyte formation. Upper chest: Negative. Other: Degenerative changes are seen involving the posterior facet joints bilaterally. IMPRESSION: Mild diffuse cortical atrophy. Mild chronic ischemic white matter disease. Large right posterior scalp hematoma. No acute intracranial abnormality seen. Severe multilevel degenerative disc disease. No acute abnormality seen in the cervical spine. Electronically Signed   By: Marijo Conception, M.D.   On: 08/19/2018 21:55   Dg Hip Unilat W Or Wo Pelvis 2-3 Views Right  Result Date: 08/19/2018 CLINICAL DATA:  [place].  Right hip pain. EXAM: DG HIP (WITH OR WITHOUT PELVIS) 2-3V RIGHT COMPARISON:  06/22/2018 FINDINGS: Stable surgical changes from left hip fracture fixation with a gamma nail and compression screw. Progressive healing changes. Advanced left hip joint degenerative changes. There is a displaced intertrochanteric fracture of the right hip with a marked varus deformity. The pubic  symphysis and SI joints are intact. No pelvic fractures. IMPRESSION: New/acute displaced intertrochanteric fracture of the right hip. Remote healed/healing left hip fracture. No definite pelvic fractures. Electronically Signed   By: Marijo Sanes M.D.   On: 08/19/2018 21:58    Review of Systems  All other systems reviewed and are negative.  Blood pressure (!) 158/86, pulse (!) 108, temperature 98 F (36.7 C), temperature source Oral, resp. rate 12, height 5' 7"  (1.702 m), weight 77.1 kg, SpO2 97 %. Physical Exam  Constitutional: She is oriented to person, place, and time. She appears well-developed and well-nourished.  HENT:  Head: Normocephalic and atraumatic.  Eyes: Pupils are equal, round, and reactive to light. EOM are normal.  Neck: Normal range of motion. Neck supple.  Cardiovascular: Normal rate and regular rhythm.  Respiratory: Effort normal and breath sounds normal.  GI: Soft. Bowel sounds are normal.  Musculoskeletal:       Right hip: She exhibits decreased range of motion, decreased strength, tenderness, bony tenderness and deformity.  Neurological: She is alert and oriented to person, place, and time.  Skin: Skin is warm and dry.  Psychiatric: She  has a normal mood and affect.    Assessment/Plan: Right hip comminuted and displaced intertrochanteric fracture  The plan will be to proceed to surgery today for surgical fixation of her right hip fracture.  The risk and benefits of surgery have been explained in detail.  Again, having had this before she is fully aware of what is involved.  Mcarthur Rossetti 08/20/2018, 8:09 AM

## 2018-08-20 NOTE — Op Note (Signed)
[name], Meredith Shaw MEDICAL RECORD ZO:[id number] ACCOUNT [id number] DATE OF BIRTH:06/06/1946 FACILITY: [facility] LOCATION: [facility] PHYSICIAN:Satrina Magallanes Aretha Parrot, MD  OPERATIVE REPORT  DATE OF PROCEDURE:  08/20/2018  PREOPERATIVE DIAGNOSIS:  Right hip comminuted displaced intertrochanteric proximal femur fracture.  POSTOPERATIVE DIAGNOSIS:  Right hip comminuted displaced intertrochanteric proximal femur fracture.  PROCEDURE:  Open reduction internal fixation of right intertrochanteric hip fracture using intramedullary rod and hip screw construct.  IMPLANTS:  A 10 mm x 36 cm InterTAN Smith and Nephew femoral nail with a 95-90 mm lag compression hip screw.  SURGEON:  Vanita Panda.  Magnus Ivan, MD  ANESTHESIA:  Spinal.  ANTIBIOTICS:  900 mg IV clindamycin.  ESTIMATED BLOOD LOSS:  100 mL.  COMPLICATIONS:  None.  INDICATIONS:  The patient is a 72 year old female who sustained a mechanical fall last evening when she was coming into the house.  She is a diabetic and does have elevated blood glucose.  She was admitted graciously to the medicine service and now  presents for definitive fixation of a displaced right hip intertrochanteric fracture.  She actually had a similar fracture on the left side in May of this year after a mechanical fall as well.  She understands fully the rationale behind surgery such as  this.  Her pain is severe.  She does have a significant deformity of the right hip.  She understands the risk of acute blood loss anemia, nerve or vessel injury as well as infection and DVT.  She understands our goals are to decrease pain, improve  mobility and improve quality of life.  DESCRIPTION OF PROCEDURE:  After informed consent was obtained and appropriate right hip was marked, she was brought to the operating room where spinal anesthesia was obtained while she was on a stretcher.  She was then placed supine on the fracture  table with her right operative leg in in-line  skeletal traction with appropriate traction and internal rotation as well as adduction.  A perineal post was then placed and her left leg was placed in a leg holder with appropriate padding.  We then assessed  her fracture under direct fluoroscopy and we were able to pull it into a reduced position.  We then chose our intramedullary nail, keeping it sterile within the box choosing a 10 mm x 36 cm femoral nail for right hip.  We passed this off, sterilely off  the back table.  Her right hip was then prepped and draped with DuraPrep and sterile drapes.  A time-out was called and she was identified as the correct patient, correct right hip.  I then made a lateral incision just proximal to the greater trochanter  and dissected down to the greater trochanter.  A guidepin was then placed in an antegrade fashion under direct fluoroscopy.  We then used initiating reamer to open up the femoral canal.  We removed the guide pin and then placed a femoral rod without  needing to ream.  Using the outrigger guide, we made a separate lateral incision.  We placed a guide pin through the lateral cortex of the femur traversing the femoral neck into the femoral head  position.  We took a measurement off of this and chose our  95/90 mm lag compression screw construct.  We drilled for these depths for both of these screws and then placed without difficulty.  As we were placing the compression screw component, we left some traction off the bed and could visually under  fluoroscopy see the fracture compressed.  We  then put the hip through range of motion under direct fluoroscopy.  We were pleased with the fracture reduction and positioning of the implants.  We then removed the outrigger guide and irrigated the 2 small  wounds with normal saline solution.  We closed the deep tissue with 0 Vicryl followed by 2-0 Vicryl subcutaneous tissue and interrupted staples on the skin.  Xeroform well-padded sterile dressing was applied.    She  was taken to recovery room in stable condition.    All final counts were correct.  There were no complications noted.  AN/NUANCE  D:08/20/2018 T:08/20/2018 JOB:[id number]

## 2018-08-20 NOTE — Progress Notes (Signed)
     Subjective: Day of Surgery Procedure(s) (LRB): ORIF RIGHT HIP INTERTROC FRACTURE (Right) Pre op for right hip ORIF. Elevated glucose, may need a ssi Patient reports pain as marked.    Objective:   VITALS:  Temp:  [98 F (36.7 C)-98.7 F (37.1 C)] 98 F (36.7 C) (11/10 0355) Pulse Rate:  [87-108] 108 (11/10 0355) Resp:  [12-18] 12 (11/09 2215) BP: (156-186)/(86-96) 158/86 (11/10 0355) SpO2:  [96 %-99 %] 97 % (11/10 0355) Weight:  [77.1 kg] 77.1 kg (11/09 2054)  Neurologically intact ABD soft Neurovascular intact Sensation intact distally Intact pulses distally Dorsiflexion/Plantar flexion intact Compartment soft   LABS Recent Labs    08/19/18 2105 08/20/18 0348  HGB 13.8 11.8*  WBC 13.4* 11.6*  PLT 236 209   Recent Labs    08/19/18 2105 08/20/18 0348  NA 139 138  K 3.7 4.0  CL 105 107  CO2 21* 21*  BUN 25* 22  CREATININE 1.30* 1.25*  GLUCOSE 365* 406*   No results for input(s): LABPT, INR in the last 72 hours.   Assessment/Plan: Day of Surgery Procedure(s) (LRB): ORIF RIGHT HIP INTERTROC FRACTURE (Right)  NPO for surgery today. Right intertrochanteric hip fracture ORIF.  Vira Browns 08/20/2018, 10:35 AMPatient ID: Meredith Shaw, female   DOB: Meredith 17, 1947, 72 y.o.   MRN: [id number]

## 2018-08-20 NOTE — Brief Op Note (Signed)
08/20/2018  12:47 PM  PATIENT:  Meredith Shaw  72 y.o. female  PRE-OPERATIVE DIAGNOSIS:  RIGHT HIP INTERTROC FRACTURE  POST-OPERATIVE DIAGNOSIS:  RIGHT HIP INTERTROC FRACTURE  PROCEDURE:  Procedure(s): ORIF RIGHT HIP INTERTROC FRACTURE (Right)  SURGEON:  Surgeon(s) and Role:    Kathryne Hitch, MD - Primary  ANESTHESIA:   spinal  EBL:  50 mL   COUNTS:  YES  TOURNIQUET:  * No tourniquets in log *  DICTATION: .Other Dictation: Dictation Number [id number]  PLAN OF CARE: Admit to inpatient   PATIENT DISPOSITION:  PACU - hemodynamically stable.   Delay start of Pharmacological VTE agent (>24hrs) due to surgical blood loss or risk of bleeding: no

## 2018-08-20 NOTE — Progress Notes (Signed)
PROGRESS NOTE    Meredith Shaw  ZOX:[id number] DOB: 05-Feb-1946 DOA: 08/19/2018 PCP: Johna Sheriff, MD  Outpatient Specialists:   Brief Narrative:  Patient is a 72 year old female with past medical history significant for diabetes mellitus, hypertension, hyperlipidemia, hypothyroidism, osteoporosis, chronic kidney disease, nephrolithiasis and bilateral knee replacements.  Patient was admitted with the right hip fracture following a fall at home.  Patient has also undergone ORIF.    Assessment & Plan:   Principal Problem:   Closed fracture of femur, intertrochanteric, right, initial encounter (HCC) Active Problems:   Diabetes (HCC)   Hypertension   Hypothyroidism   Hyperlipidemia, mixed   CKD (chronic kidney disease) stage 3, GFR 30-59 ml/min (HCC)   Controlled type 2 diabetes mellitus with hyperglycemia (HCC)   Hip fracture (HCC)   Closed fracture of right femur, intertrochanteric: Patient is status post ORIF. Postop management as per orthopedic team.     Hypertension: Continue to optimize.  Hypothyroidism: Stable. Follow TSH.  Hyperlipidemia, mixed: Continue home medications.  CKD (chronic kidney disease) stage 3: Continue to monitor closely.  Diabetes mellitus type 2:  Continue sensitive SSI.  DVT prophylaxis:  SCD    Code Status:  FULL CODE   as per patient  I had personally discussed CODE STATUS with patient    Family Communication:      Disposition Plan:   This will depend on hospital course.   Consultants:   Orthopedics.  Procedures:   ORIF right hip.  Antimicrobials:   None.   Subjective: No new complaints. No chest pain. No shortness of breath. Pain is controlled.  Objective: Vitals:   08/19/18 2215 08/19/18 2300 08/20/18 0000 08/20/18 0355  BP: (!) 178/93 (!) 156/94 (!) 166/90 (!) 158/86  Pulse: 87 93 89 (!) 108  Resp: 12     Temp:   98 F (36.7 C) 98 F (36.7 C)  TempSrc:   Oral Oral  SpO2: 99% 96% 98% 97%  Weight:       Height:       No intake or output data in the 24 hours ending 08/20/18 1120 Filed Weights   08/19/18 2054  Weight: 77.1 kg    Examination:  General exam: Appears calm and comfortable  Respiratory system: Clear to auscultation. Respiratory effort normal. Cardiovascular system: S1 & S2 heard.  No pedal edema. Gastrointestinal system: Abdomen is nondistended, soft and nontender. No organomegaly or masses felt. Normal bowel sounds heard. Central nervous system: Alert and oriented. No focal neurological deficits. Extremities: No edema.  Data Reviewed: I have personally reviewed following labs and imaging studies  CBC: Recent Labs  Lab 08/19/18 2105 08/20/18 0348  WBC 13.4* 11.6*  NEUTROABS 11.1*  --   HGB 13.8 11.8*  HCT 41.9 35.0*  MCV 87.1 85.4  PLT 236 209   Basic Metabolic Panel: Recent Labs  Lab 08/19/18 2105 08/20/18 0348  NA 139 138  K 3.7 4.0  CL 105 107  CO2 21* 21*  GLUCOSE 365* 406*  BUN 25* 22  CREATININE 1.30* 1.25*  CALCIUM 9.9 9.4   GFR: Estimated Creatinine Clearance: 43.5 mL/min (A) (by C-G formula based on SCr of 1.25 mg/dL (H)). Liver Function Tests: Recent Labs  Lab 08/19/18 2105 08/20/18 0348  AST 21  --   ALT 19  --   ALKPHOS 81  --   BILITOT 0.3  --   PROT 7.2  --   ALBUMIN 3.8 3.1*   No results for input(s): LIPASE, AMYLASE in the last  168 hours. No results for input(s): AMMONIA in the last 168 hours. Coagulation Profile: No results for input(s): INR, PROTIME in the last 168 hours. Cardiac Enzymes: No results for input(s): CKTOTAL, CKMB, CKMBINDEX, TROPONINI in the last 168 hours. BNP (last 3 results) No results for input(s): PROBNP in the last 8760 hours. HbA1C: No results for input(s): HGBA1C in the last 72 hours. CBG: Recent Labs  Lab 08/20/18 1031  GLUCAP 309*   Lipid Profile: No results for input(s): CHOL, HDL, LDLCALC, TRIG, CHOLHDL, LDLDIRECT in the last 72 hours. Thyroid Function Tests: No results for  input(s): TSH, T4TOTAL, FREET4, T3FREE, THYROIDAB in the last 72 hours. Anemia Panel: No results for input(s): VITAMINB12, FOLATE, FERRITIN, TIBC, IRON, RETICCTPCT in the last 72 hours. Urine analysis:    Component Value Date/Time   COLORURINE YELLOW 02/13/2018 1335   APPEARANCEUR HAZY (A) 02/13/2018 1335   APPEARANCEUR Clear 12/31/2017 0829   LABSPEC 1.018 02/13/2018 1335   PHURINE 5.0 02/13/2018 1335   GLUCOSEU NEGATIVE 02/13/2018 1335   HGBUR LARGE (A) 02/13/2018 1335   BILIRUBINUR NEGATIVE 02/13/2018 1335   BILIRUBINUR Negative 12/31/2017 0829   KETONESUR NEGATIVE 02/13/2018 1335   PROTEINUR 30 (A) 02/13/2018 1335   UROBILINOGEN 0.2 04/02/2014 1424   NITRITE NEGATIVE 02/13/2018 1335   LEUKOCYTESUR MODERATE (A) 02/13/2018 1335   LEUKOCYTESUR 2+ (A) 12/31/2017 0829   Sepsis Labs: @LABRCNTIP (procalcitonin:4,lacticidven:4)  )No results found for this or any previous visit (from the past 240 hour(s)).       Radiology Studies: Dg Chest 1 View  Result Date: 08/19/2018 CLINICAL DATA:  Larey Seat.  Right hip fracture. EXAM: CHEST  1 VIEW COMPARISON:  02/09/2018 FINDINGS: The heart is enlarged but stable. There is tortuosity and mild ectasia of the thoracic aorta. Prominent mediastinal contours due to the supine position the patient and the AP projection of the film. The lungs are clear. No pneumothorax. Stable eventration right hemidiaphragm. The bony thorax is intact. IMPRESSION: No acute cardiopulmonary findings. Stable cardiac enlargement and prominent mediastinal contours due to the supine position the patient and the AP projection of the film. Electronically Signed   By: Rudie Meyer M.D.   On: 08/19/2018 21:59   Ct Head Wo Contrast  Result Date: 08/19/2018 CLINICAL DATA:  Fall at home. EXAM: CT HEAD WITHOUT CONTRAST CT CERVICAL SPINE WITHOUT CONTRAST TECHNIQUE: Multidetector CT imaging of the head and cervical spine was performed following the standard protocol without intravenous  contrast. Multiplanar CT image reconstructions of the cervical spine were also generated. COMPARISON:  None. FINDINGS: CT HEAD FINDINGS Brain: Mild diffuse cortical atrophy is noted. Mild chronic ischemic white matter disease is noted. No mass effect or midline shift is noted. Ventricular size is within normal limits. There is no evidence of mass lesion, hemorrhage or acute infarction. Vascular: No hyperdense vessel or unexpected calcification. Skull: Normal. Negative for fracture or focal lesion. Sinuses/Orbits: No acute finding. Other: Large right posterior scalp hematoma is noted. CT CERVICAL SPINE FINDINGS Alignment: Grade 1 anterolisthesis of C4-5 is noted secondary to posterior facet joint hypertrophy. Skull base and vertebrae: No acute fracture. No primary bone lesion or focal pathologic process. Soft tissues and spinal canal: No prevertebral fluid or swelling. No visible canal hematoma. Disc levels: Severe degenerative disc disease is noted at C4-5 and C5-6 with anterior osteophyte formation. Upper chest: Negative. Other: Degenerative changes are seen involving the posterior facet joints bilaterally. IMPRESSION: Mild diffuse cortical atrophy. Mild chronic ischemic white matter disease. Large right posterior scalp hematoma. No acute intracranial  abnormality seen. Severe multilevel degenerative disc disease. No acute abnormality seen in the cervical spine. Electronically Signed   By: Lupita Raider, M.D.   On: 08/19/2018 21:55   Ct Cervical Spine Wo Contrast  Result Date: 08/19/2018 CLINICAL DATA:  Fall at home. EXAM: CT HEAD WITHOUT CONTRAST CT CERVICAL SPINE WITHOUT CONTRAST TECHNIQUE: Multidetector CT imaging of the head and cervical spine was performed following the standard protocol without intravenous contrast. Multiplanar CT image reconstructions of the cervical spine were also generated. COMPARISON:  None. FINDINGS: CT HEAD FINDINGS Brain: Mild diffuse cortical atrophy is noted. Mild chronic  ischemic white matter disease is noted. No mass effect or midline shift is noted. Ventricular size is within normal limits. There is no evidence of mass lesion, hemorrhage or acute infarction. Vascular: No hyperdense vessel or unexpected calcification. Skull: Normal. Negative for fracture or focal lesion. Sinuses/Orbits: No acute finding. Other: Large right posterior scalp hematoma is noted. CT CERVICAL SPINE FINDINGS Alignment: Grade 1 anterolisthesis of C4-5 is noted secondary to posterior facet joint hypertrophy. Skull base and vertebrae: No acute fracture. No primary bone lesion or focal pathologic process. Soft tissues and spinal canal: No prevertebral fluid or swelling. No visible canal hematoma. Disc levels: Severe degenerative disc disease is noted at C4-5 and C5-6 with anterior osteophyte formation. Upper chest: Negative. Other: Degenerative changes are seen involving the posterior facet joints bilaterally. IMPRESSION: Mild diffuse cortical atrophy. Mild chronic ischemic white matter disease. Large right posterior scalp hematoma. No acute intracranial abnormality seen. Severe multilevel degenerative disc disease. No acute abnormality seen in the cervical spine. Electronically Signed   By: Lupita Raider, M.D.   On: 08/19/2018 21:55   Dg Hip Unilat W Or Wo Pelvis 2-3 Views Right  Result Date: 08/19/2018 CLINICAL DATA:  Larey Seat.  Right hip pain. EXAM: DG HIP (WITH OR WITHOUT PELVIS) 2-3V RIGHT COMPARISON:  06/22/2018 FINDINGS: Stable surgical changes from left hip fracture fixation with a gamma nail and compression screw. Progressive healing changes. Advanced left hip joint degenerative changes. There is a displaced intertrochanteric fracture of the right hip with a marked varus deformity. The pubic symphysis and SI joints are intact. No pelvic fractures. IMPRESSION: New/acute displaced intertrochanteric fracture of the right hip. Remote healed/healing left hip fracture. No definite pelvic fractures.  Electronically Signed   By: Rudie Meyer M.D.   On: 08/19/2018 21:58        Scheduled Meds: . [MAR Hold] insulin aspart  0-15 Units Subcutaneous Q4H  . [MAR Hold] levothyroxine  137 mcg Oral QAC breakfast  . [MAR Hold] metoprolol tartrate  100 mg Oral BID   Continuous Infusions: . sodium chloride    . clindamycin    . lactated ringers 10 mL/hr at 08/20/18 1104  . [MAR Hold] methocarbamol (ROBAXIN) IV       LOS: 1 day    Time spent: 25 minutes.    Berton Mount, MD  [facility] Pager #: [phone] 7PM-7AM contact night coverage as above

## 2018-08-20 NOTE — Anesthesia Procedure Notes (Signed)
Spinal  Patient location during procedure: OR Start time: 08/20/2018 11:34 AM End time: 08/20/2018 11:39 AM Staffing Anesthesiologist: Cecile Hearing, MD Performed: anesthesiologist  Preanesthetic Checklist Completed: patient identified, surgical consent, pre-op evaluation, timeout performed, IV checked, risks and benefits discussed and monitors and equipment checked Spinal Block Patient position: right lateral decubitus Prep: site prepped and draped and DuraPrep Patient monitoring: continuous pulse ox and blood pressure Approach: midline Location: L3-4 Injection technique: single-shot Needle Needle type: Pencan  Needle gauge: 24 G Assessment Sensory level: T8 Additional Notes Functioning IV was confirmed and monitors were applied. Sterile prep and drape, including hand hygiene, mask and sterile gloves were used. The patient was positioned and the spine was prepped. The skin was anesthetized with lidocaine.  Free flow of clear CSF was obtained prior to injecting local anesthetic into the CSF.  The spinal needle aspirated freely following injection.  The needle was carefully withdrawn.  The patient tolerated the procedure well. Consent was obtained prior to procedure with all questions answered and concerns addressed. Risks including but not limited to bleeding, infection, nerve damage, paralysis, failed block, inadequate analgesia, allergic reaction, high spinal, itching and headache were discussed and the patient wished to proceed.   Arrie Aran, MD

## 2018-08-20 NOTE — Anesthesia Preprocedure Evaluation (Signed)
Anesthesia Evaluation  Patient identified by MRN, date of birth, ID band Patient awake    Reviewed: Allergy & Precautions, NPO status , Patient's Chart, lab work & pertinent test results, reviewed documented beta blocker date and time   Airway Mallampati: II  TM Distance: <3 FB Neck ROM: Full    Dental  (+) Teeth Intact, Dental Advisory Given   Pulmonary neg pulmonary ROS,    Pulmonary exam normal breath sounds clear to auscultation       Cardiovascular hypertension, Pt. on home beta blockers and Pt. on medications Normal cardiovascular exam Rhythm:Regular Rate:Normal     Neuro/Psych negative neurological ROS     GI/Hepatic negative GI ROS, Neg liver ROS,   Endo/Other  diabetes, Type 2, Oral Hypoglycemic AgentsHypothyroidism   Renal/GU negative Renal ROS     Musculoskeletal  (+) Arthritis , RIGHT HIP INTERTROC FRACTURE   Abdominal   Peds  Hematology negative hematology ROS (+) Plt 209k   Anesthesia Other Findings Day of surgery medications reviewed with the patient.  Reproductive/Obstetrics                             Anesthesia Physical Anesthesia Plan  ASA: III  Anesthesia Plan: Spinal   Post-op Pain Management:    Induction:   PONV Risk Score and Plan: 2 and Propofol infusion and Treatment may vary due to age or medical condition  Airway Management Planned: Natural Airway and Simple Face Mask  Additional Equipment:   Intra-op Plan:   Post-operative Plan:   Informed Consent: I have reviewed the patients History and Physical, chart, labs and discussed the procedure including the risks, benefits and alternatives for the proposed anesthesia with the patient or authorized representative who has indicated his/her understanding and acceptance.   Dental advisory given  Plan Discussed with: CRNA, Anesthesiologist and Surgeon  Anesthesia Plan Comments:         Anesthesia  Quick Evaluation

## 2018-08-21 ENCOUNTER — Other Ambulatory Visit: Payer: Self-pay

## 2018-08-21 ENCOUNTER — Encounter (HOSPITAL_COMMUNITY): Payer: Self-pay | Admitting: General Practice

## 2018-08-21 LAB — CBC
HEMATOCRIT: 30.1 % — AB (ref 36.0–46.0)
HEMOGLOBIN: 10.3 g/dL — AB (ref 12.0–15.0)
MCH: 29.2 pg (ref 26.0–34.0)
MCHC: 34.2 g/dL (ref 30.0–36.0)
MCV: 85.3 fL (ref 80.0–100.0)
Platelets: 184 10*3/uL (ref 150–400)
RBC: 3.53 MIL/uL — ABNORMAL LOW (ref 3.87–5.11)
RDW: 14.5 % (ref 11.5–15.5)
WBC: 10.8 10*3/uL — ABNORMAL HIGH (ref 4.0–10.5)
nRBC: 0 % (ref 0.0–0.2)

## 2018-08-21 LAB — BASIC METABOLIC PANEL
ANION GAP: 9 (ref 5–15)
BUN: 16 mg/dL (ref 8–23)
CHLORIDE: 106 mmol/L (ref 98–111)
CO2: 23 mmol/L (ref 22–32)
Calcium: 9 mg/dL (ref 8.9–10.3)
Creatinine, Ser: 1.2 mg/dL — ABNORMAL HIGH (ref 0.44–1.00)
GFR calc Af Amer: 51 mL/min — ABNORMAL LOW (ref >60)
GFR calc non Af Amer: 44 mL/min — ABNORMAL LOW (ref >60)
Glucose, Bld: 201 mg/dL — ABNORMAL HIGH (ref 70–99)
Potassium: 3.5 mmol/L (ref 3.5–5.1)
Sodium: 138 mmol/L (ref 135–145)

## 2018-08-21 LAB — GLUCOSE, CAPILLARY
GLUCOSE-CAPILLARY: 192 mg/dL — AB (ref 70–99)
GLUCOSE-CAPILLARY: 222 mg/dL — AB (ref 70–99)
Glucose-Capillary: 162 mg/dL — ABNORMAL HIGH (ref 70–99)
Glucose-Capillary: 191 mg/dL — ABNORMAL HIGH (ref 70–99)
Glucose-Capillary: 213 mg/dL — ABNORMAL HIGH (ref 70–99)
Glucose-Capillary: 235 mg/dL — ABNORMAL HIGH (ref 70–99)

## 2018-08-21 LAB — VITAMIN D 25 HYDROXY (VIT D DEFICIENCY, FRACTURES): Vit D, 25-Hydroxy: 22.6 ng/mL — ABNORMAL LOW (ref 30.0–100.0)

## 2018-08-21 LAB — TSH: TSH: 1.295 u[IU]/mL (ref 0.350–4.500)

## 2018-08-21 NOTE — Clinical Social Work Note (Signed)
Clinical Social Work Assessment  Patient Details  [name] MRN: [id number] Date of Birth: 1946/05/12  Date of referral:  08/21/18               Reason for consult:  Discharge Planning                Permission sought to share information with:  Case Manager, Facility Medical sales representative, Family Supports Permission granted to share information::  Yes, Verbal Permission Granted  Name::     [name]  Agency::  SNFs  Relationship::  daughter  Contact Information:  [phone]  Housing/Transportation Living arrangements for the past 2 months:    Source of Information:  Patient Patient Interpreter Needed:  None Criminal Activity/Legal Involvement Pertinent to Current Situation/Hospitalization:  No - Comment as needed Significant Relationships:  Adult Children Lives with:  Self Do you feel safe going back to the place where you live?  No Need for family participation in patient care:  Yes (Comment)  Care giving concerns:  CSW received referral for possible SNF placement at time of discharge. Spoke with patient regarding possibility of SNF placement . Patient's daughter [name] and her husband are currently unable to care for her at their home given patient's current needs and fall risk.  Patient expressed understanding of PT recommendation and are agreeable to SNF placement at time of discharge. CSW to continue to follow and assist with discharge planning needs.     Social Worker assessment / plan:  Spoke with patient concerning possibility of rehab at SNF before returning home. Patient recommended CSW contact her daughter as patient was unable to remember a facility in [place] she was interested in looking at. Patient reports her daughter lives in [place] and she wants to be close to her.    Employment status:  Retired Database administrator PT Recommendations:  Skilled Nursing Facility Information / Referral to community resources:  Skilled  Nursing Facility  Patient/Family's Response to care:  Patient reports she recognizes need for rehab before returning home and are agreeable to a SNF in [place] pending  Preferences from daughter (CSW left voicemail with daughter Teres) . CSW explained insurance authorization process. Patient reported that she wants to get stronger to be able to come back home.    Patient/Family's Understanding of and Emotional Response to Diagnosis, Current Treatment, and Prognosis:  Patient/family is realistic regarding therapy needs and expressed being hopeful for SNF placement in [place]. Patient expressed understanding of CSW role and discharge process as well as medical condition. No questions/concerns about plan or treatment.    Emotional Assessment Appearance:  Appears stated age Attitude/Demeanor/Rapport:  Self-Confident, Gracious Affect (typically observed):  Accepting, Adaptable Orientation:  Oriented to Self, Oriented to Place, Oriented to  Time, Oriented to Situation Alcohol / Substance use:  Not Applicable Psych involvement (Current and /or in the community):  No (Comment)  Discharge Needs  Concerns to be addressed:  Discharge Planning Concerns Readmission within the last 30 days:  No Current discharge risk:  Dependent with Mobility Barriers to Discharge:  Continued Medical Work up   [employer], LCSW 08/21/2018, 12:09 PM

## 2018-08-21 NOTE — Evaluation (Signed)
Occupational Therapy Evaluation Patient Details [name] MRN: [id number] DOB: January 05, 1946 Today's Date: 08/21/2018    History of Present Illness Pt is a 72 yo female admitted after a fall with a R hip fx.  Pt underwent an ORIF of R  hip.  Pt with history of DM, HTN, B TKR, L ORIF from fall in 02/2018.   Clinical Impression   Pt admitted with the above diagnosis and has the deficits below.  Pt would benefit from cont OT to increase independence with basic adls and transfers back to modified independent level so she can return home with her husband.  Feel pt would be too much for husband to handle at home at this point.  Rec SNF.     Follow Up Recommendations  SNF;Supervision/Assistance - 24 hour    Equipment Recommendations  None recommended by OT    Recommendations for Other Services       Precautions / Restrictions Precautions Precautions: Fall Precaution Comments: pt fell in may with fx to LLE Restrictions Weight Bearing Restrictions: Yes RLE Weight Bearing: Weight bearing as tolerated      Mobility Bed Mobility Overal bed mobility: Needs Assistance Bed Mobility: Supine to Sit     Supine to sit: Max assist;HOB elevated     General bed mobility comments: Pt required assist to get R leg to EOB and to push up into full sitting.  Due to pain used bed pad to square up hips on EOB after in sitting.  Transfers Overall transfer level: Needs assistance Equipment used: Rolling walker (2 wheeled) Transfers: Sit to/from [employer] Sit to Stand: Mod assist Stand pivot transfers: Mod assist       General transfer comment: Pt required a great amount of assist to power up.  Pt encouraged to lean forward and really use BUEs and L leg to assist.     Balance Overall balance assessment: Needs assistance Sitting-balance support: Feet supported Sitting balance-Leahy Scale: Good     Standing balance support: Bilateral upper extremity supported;During  functional activity Standing balance-Leahy Scale: Poor Standing balance comment: Pt dependent on outside support to remain standing.                           ADL either performed or assessed with clinical judgement   ADL Overall ADL's : Needs assistance/impaired Eating/Feeding: Independent;Sitting   Grooming: Wash/dry face;Wash/dry hands;Oral care;Set up;Sitting   Upper Body Bathing: Set up;Sitting   Lower Body Bathing: Maximal assistance;Sit to/from stand;Cueing for compensatory techniques   Upper Body Dressing : Set up;Sitting   Lower Body Dressing: Maximal assistance;Sit to/from stand;Cueing for compensatory techniques   Toilet Transfer: Moderate assistance;RW;Stand-pivot;BSC   Toileting- Clothing Manipulation and Hygiene: Maximal assistance;Sit to/from stand Toileting - Clothing Manipulation Details (indicate cue type and reason): Pt stood while therapist cleaned pt.     Functional mobility during ADLs: Moderate assistance;Rolling walker General ADL Comments: Pt limited more with LE adls due to pain and hip surgery. Pt motivated and familiar with adl routine from other hip surgery.     Vision Baseline Vision/History: No visual deficits Patient Visual Report: No change from baseline       Perception Perception Perception Tested?: No   Praxis Praxis Praxis tested?: Not tested    Pertinent Vitals/Pain Pain Assessment: Faces Faces Pain Scale: Hurts even more Pain Location: R hip Pain Descriptors / Indicators: Aching;Operative site guarding;Discomfort Pain Intervention(s): Monitored during session;Patient requesting pain meds-RN notified;Repositioned;Limited activity within  patient's tolerance     Hand Dominance Right   Extremity/Trunk Assessment Upper Extremity Assessment Upper Extremity Assessment: Overall WFL for tasks assessed   Lower Extremity Assessment Lower Extremity Assessment: Defer to PT evaluation   Cervical / Trunk Assessment Cervical  / Trunk Assessment: Normal   Communication Communication Communication: No difficulties   Cognition Arousal/Alertness: Awake/alert Behavior During Therapy: WFL for tasks assessed/performed Overall Cognitive Status: Within Functional Limits for tasks assessed                                     General Comments  Pt shuffles feet when moving.  Encouraged pt to pick feet up when moving an use walker to assist unweighting her legs.      Exercises     Shoulder Instructions      Home Living Family/patient expects to be discharged to:: Private residence Living Arrangements: Spouse/significant other Available Help at Discharge: Family;Available 24 hours/day Type of Home: House Home Access: Stairs to enter [employer] of Steps: 2 Entrance Stairs-Rails: None Home Layout: One level     Bathroom Shower/Tub: Producer, television/film/video: Handicapped height     Home Equipment: Environmental consultant - 2 wheels;Bedside commode;Shower seat;Cane - single point;Hand held shower head;Other (comment);Grab bars - toilet;Grab bars - tub/shower;Adaptive equipment;Wheelchair - Recruitment consultant Equipment: Ship broker        Prior Functioning/Environment Level of Independence: Needs assistance    ADL's / Homemaking Assistance Needed: required assist donning L sock and shoe. Has equipment and knows how to use but husband does it quicker.            OT Problem List: Decreased activity tolerance;Impaired balance (sitting and/or standing);Decreased knowledge of use of DME or AE;Pain      OT Treatment/Interventions: Self-care/ADL training;DME and/or AE instruction;Therapeutic activities    OT Goals(Current goals can be found in the care plan section) Acute Rehab OT Goals Patient Stated Goal: to be independent once again OT Goal Formulation: With patient Time For Goal Achievement: 09/04/18 Potential to Achieve Goals: Good ADL Goals Pt Will Perform Grooming: with  supervision;standing Pt Will Perform Lower Body Bathing: with supervision;with adaptive equipment;sit to/from stand Pt Will Perform Lower Body Dressing: with supervision;with adaptive equipment;sit to/from stand Additional ADL Goal #1: Pt will walk to toilet with walker and toilet on 3:1 over commode with supervision.  OT Frequency: Min 2X/week   Barriers to D/C:            Co-evaluation              AM-PAC PT "6 Clicks" Daily Activity     Outcome Measure Help from another person eating meals?: None Help from another person taking care of personal grooming?: None Help from another person toileting, which includes using toliet, bedpan, or urinal?: A Lot Help from another person bathing (including washing, rinsing, drying)?: A Lot Help from another person to put on and taking off regular upper body clothing?: A Little Help from another person to put on and taking off regular lower body clothing?: A Lot 6 Click Score: 17   End of Session Equipment Utilized During Treatment: Rolling walker Nurse Communication: Mobility status  Activity Tolerance: Patient limited by pain Patient left: in chair;with call bell/phone within reach  OT Visit Diagnosis: Unsteadiness on feet (R26.81);Pain Pain - Right/Left: Right Pain - part of body: Hip  Time: 1031-1056 OT Time Calculation (min): 25 min Charges:  OT General Charges $OT Visit: 1 Visit OT Evaluation $OT Eval Moderate Complexity: 1 Mod OT Treatments $Self Care/Home Management : 8-22 mins  Tory Emerald, OTR/L [phone]  Hope Budds 08/21/2018, 11:15 AM

## 2018-08-21 NOTE — NC FL2 (Signed)
[place] MEDICAID FL2 LEVEL OF CARE SCREENING TOOL     IDENTIFICATION  Patient [name] Birthdate: 1946-09-12 Sex: female Admission Date (Current Location): 08/19/2018  [facility] and [place] Number:  [employer] and Address:  [facility], [address] [place], [place] [address]      Provider Number: [id number]  Attending Physician Name and Address:  Barnetta Chapel, MD  Relative Name and Phone Number:  [name] (daughter) [phone]    Current Level of Care: Hospital Recommended Level of Care: Skilled Nursing Facility Prior Approval Number:    Date Approved/Denied:   PASRR Number: [id number]  Discharge Plan: SNF    Current Diagnoses: Patient Active Problem List   Diagnosis Date Noted  . Closed fracture of femur, intertrochanteric, right, initial encounter (HCC) 08/19/2018  . Type 2 diabetes mellitus with complication, without long-term current use of insulin (HCC) 02/15/2018  . Closed left hip fracture (HCC) 02/10/2018  . CKD (chronic kidney disease) stage 3, GFR 30-59 ml/min (HCC) 02/10/2018  . Controlled type 2 diabetes mellitus with hyperglycemia (HCC) 02/10/2018  . Hip fracture (HCC) 02/10/2018  . Pressure injury of skin 02/10/2018  . Chest pain 02/19/2016  . Arthritis of left knee 04/09/2014  . Status post total knee replacement 04/09/2014  . Arthropathy   . Diabetes (HCC)   . Hypertension   . Kidney stones   . Hypothyroidism   . Degenerative arthritis of knee 06/09/2012  . Flatulence, eructation, and gas pain 12/24/2005  . Abdominal or pelvic swelling, mass, or lump, other specified site 12/24/2005  . Osteoporosis 12/04/2004  . Symptomatic menopausal or female climacteric states 12/04/2004  . Arthropathy, lower leg 04/21/2004  . Hematuria 09/17/2003  . Other long term (current) drug therapy 07/05/2002  . Hyperlipidemia, mixed 03/01/2000    Orientation RESPIRATION BLADDER Height & Weight      Self, Time, Situation, Place  Normal Continent Weight: 170 lb (77.1 kg) Height:  5\' 7"  (170.2 cm)  BEHAVIORAL SYMPTOMS/MOOD NEUROLOGICAL BOWEL NUTRITION STATUS      Continent Diet(see discharge summary)  AMBULATORY STATUS COMMUNICATION OF NEEDS Skin   Limited Assist Verbally Surgical wounds, Skin abrasions(right thigh closed surgical incision, abrasion on right head, rash on buttocks)                       Personal Care Assistance Level of Assistance  Bathing, Feeding, Dressing, Total care Bathing Assistance: Limited assistance Feeding assistance: Independent Dressing Assistance: Limited assistance Total Care Assistance: Limited assistance   Functional Limitations Info  Sight, Hearing, Speech Sight Info: Adequate Hearing Info: Adequate Speech Info: Adequate    SPECIAL CARE FACTORS FREQUENCY  PT (By licensed PT), OT (By licensed OT)     PT Frequency: min 5x weekly OT Frequency: min 2x weekly            Contractures Contractures Info: Not present    Additional Factors Info  Code Status, Allergies Code Status Info: Full Allergies Info: Allergies:  Penicillins           Current Medications (08/21/2018):  This is the current hospital active medication list Current Facility-Administered Medications  Medication Dose Route Frequency Provider Last Rate Last Dose  . 0.9 %  sodium chloride infusion   Intravenous Continuous Kathryne Hitch, MD 100 mL/hr at 08/20/18 1900    . acetaminophen (TYLENOL) tablet 325-650 mg  325-650 mg Oral Q6H PRN Kathryne Hitch, MD      . alum &  mag hydroxide-simeth (MAALOX/MYLANTA) 200-200-20 MG/5ML suspension 30 mL  30 mL Oral Q4H PRN Kathryne Hitch, MD      . aspirin EC tablet 325 mg  325 mg Oral Q breakfast Kathryne Hitch, MD   325 mg at 08/21/18 [phone]  . bisacodyl (DULCOLAX) suppository 10 mg  10 mg Rectal Daily PRN Kathryne Hitch, MD      . docusate sodium (COLACE) capsule 100 mg  100 mg Oral  BID Kathryne Hitch, MD   100 mg at 08/21/18 5409  . HYDROcodone-acetaminophen (NORCO/VICODIN) 5-325 MG per tablet 1-2 tablet  1-2 tablet Oral Q6H PRN Kathryne Hitch, MD   2 tablet at 08/21/18 0529  . HYDROmorphone (DILAUDID) injection 0.5-1 mg  0.5-1 mg Intravenous Q4H PRN Kathryne Hitch, MD      . insulin aspart (novoLOG) injection 0-15 Units  0-15 Units Subcutaneous Q4H Kathryne Hitch, MD   5 Units at 08/21/18 1140  . lactated ringers infusion   Intravenous Continuous Kathryne Hitch, MD 10 mL/hr at 08/20/18 1104    . levothyroxine (SYNTHROID, LEVOTHROID) tablet 137 mcg  137 mcg Oral QAC breakfast Kathryne Hitch, MD   137 mcg at 08/21/18 0529  . menthol-cetylpyridinium (CEPACOL) lozenge 3 mg  1 lozenge Oral PRN Kathryne Hitch, MD       Or  . phenol (CHLORASEPTIC) mouth spray 1 spray  1 spray Mouth/Throat PRN Kathryne Hitch, MD      . methocarbamol (ROBAXIN) tablet 500 mg  500 mg Oral Q6H PRN Kathryne Hitch, MD   500 mg at 08/21/18 1101   Or  . methocarbamol (ROBAXIN) 500 mg in dextrose 5 % 50 mL IVPB  500 mg Intravenous Q6H PRN Kathryne Hitch, MD      . metoCLOPramide (REGLAN) tablet 5-10 mg  5-10 mg Oral Q8H PRN Kathryne Hitch, MD       Or  . metoCLOPramide (REGLAN) injection 5-10 mg  5-10 mg Intravenous Q8H PRN Kathryne Hitch, MD      . metoprolol tartrate (LOPRESSOR) tablet 100 mg  100 mg Oral BID Kathryne Hitch, MD   100 mg at 08/21/18 8119  . morphine 2 MG/ML injection 0.5 mg  0.5 mg Intravenous Q2H PRN Kathryne Hitch, MD   0.5 mg at 08/20/18 1455  . ondansetron (ZOFRAN) tablet 4 mg  4 mg Oral Q6H PRN Kathryne Hitch, MD       Or  . ondansetron [facility]) injection 4 mg  4 mg Intravenous Q6H PRN Kathryne Hitch, MD      . oxyCODONE (Oxy IR/ROXICODONE) immediate release tablet 10-15 mg  10-15 mg Oral Q4H PRN Berton Mount I, MD   10 mg at 08/21/18  1101  . oxyCODONE (Oxy IR/ROXICODONE) immediate release tablet 5-10 mg  5-10 mg Oral Q4H PRN Kathryne Hitch, MD   5 mg at 08/20/18 2050  . pantoprazole (PROTONIX) EC tablet 40 mg  40 mg Oral Daily Kathryne Hitch, MD   40 mg at 08/21/18 0837  . polyethylene glycol (MIRALAX / GLYCOLAX) packet 17 g  17 g Oral Daily PRN Kathryne Hitch, MD         Discharge Medications: Please see discharge summary for a list of discharge medications.  Relevant Imaging Results:  Relevant Lab Results:   Additional Information SSN: [id number]  Gildardo Griffes, LCSW

## 2018-08-21 NOTE — Progress Notes (Signed)
PROGRESS NOTE    Meredith Shaw  ZOX:[id number] DOB: 1946/04/18 DOA: 08/19/2018 PCP: Johna Sheriff, MD  Outpatient Specialists:   Brief Narrative:  Patient is a 72 year old female with past medical history significant for diabetes mellitus, hypertension, hyperlipidemia, hypothyroidism, osteoporosis, chronic kidney disease, nephrolithiasis and bilateral knee replacements.  Patient was admitted with the right hip fracture following a fall at home.  Patient has also undergone ORIF.  08/21/2018: No new complaints.  Pain is controlled.  Orthopedic, PT and OT input appreciated.  For skilled nursing facility disposition.  Assessment & Plan:   Principal Problem:   Closed fracture of femur, intertrochanteric, right, initial encounter (HCC) Active Problems:   Diabetes (HCC)   Hypertension   Hypothyroidism   Hyperlipidemia, mixed   CKD (chronic kidney disease) stage 3, GFR 30-59 ml/min (HCC)   Controlled type 2 diabetes mellitus with hyperglycemia (HCC)   Hip fracture (HCC)   Closed fracture of right femur, intertrochanteric: Patient is status post ORIF. Postop management as per orthopedic team.   Adequate pain control. Skin nursing facility disposition.   Hypertension: Continue to optimize. Last blood pressure check 123/82 mmHg.  Hypothyroidism: Stable. Check TSH.  Hyperlipidemia, mixed: Continue home medications.  CKD (chronic kidney disease) stage 3: Continue to monitor closely.  Diabetes mellitus type 2:  Continue sensitive SSI. Blood sugar ranges from 162 to 213.  DVT prophylaxis:  SCD    Code Status:  FULL CODE   as per patient  I had personally discussed CODE STATUS with patient    Family Communication:      Disposition Plan:   This will depend on hospital course.   Consultants:   Orthopedics.  Procedures:   ORIF right hip.  Antimicrobials:   None.   Subjective: No new complaints. No chest pain. No shortness of breath. Pain is  controlled.  Objective: Vitals:   08/20/18 1946 08/20/18 2356 08/21/18 0352 08/21/18 0834  BP: (!) 179/86 (!) 162/81 137/73 (!) 152/81  Pulse: 77 77 70 71  Resp:      Temp: 98.4 F (36.9 C) 99.5 F (37.5 C) 99.1 F (37.3 C)   TempSrc: Oral Oral Oral   SpO2: 94% 94% 96%   Weight:      Height:        Intake/Output Summary (Last 24 hours) at 08/21/2018 1150 Last data filed at 08/21/2018 0600 Gross per 24 hour  Intake 1373.95 ml  Output 725 ml  Net 648.95 ml   Filed Weights   08/19/18 2054  Weight: 77.1 kg    Examination:  General exam: Appears calm and comfortable  Respiratory system: Clear to auscultation. Respiratory effort normal. Cardiovascular system: S1 & S2 heard.  No pedal edema. Gastrointestinal system: Abdomen is nondistended, soft and nontender. No organomegaly or masses felt. Normal bowel sounds heard. Central nervous system: Alert and oriented. No focal neurological deficits. Extremities: No edema.  Data Reviewed: I have personally reviewed following labs and imaging studies  CBC: Recent Labs  Lab 08/19/18 2105 08/20/18 0348 08/21/18 0302  WBC 13.4* 11.6* 10.8*  NEUTROABS 11.1*  --   --   HGB 13.8 11.8* 10.3*  HCT 41.9 35.0* 30.1*  MCV 87.1 85.4 85.3  PLT 236 209 184   Basic Metabolic Panel: Recent Labs  Lab 08/19/18 2105 08/20/18 0348 08/21/18 0302  NA 139 138 138  K 3.7 4.0 3.5  CL 105 107 106  CO2 21* 21* 23  GLUCOSE 365* 406* 201*  BUN 25* 22 16  CREATININE 1.30*  1.25* 1.20*  CALCIUM 9.9 9.4 9.0   GFR: Estimated Creatinine Clearance: 45.4 mL/min (A) (by C-G formula based on SCr of 1.2 mg/dL (H)). Liver Function Tests: Recent Labs  Lab 08/19/18 2105 08/20/18 0348  AST 21  --   ALT 19  --   ALKPHOS 81  --   BILITOT 0.3  --   PROT 7.2  --   ALBUMIN 3.8 3.1*   No results for input(s): LIPASE, AMYLASE in the last 168 hours. No results for input(s): AMMONIA in the last 168 hours. Coagulation Profile: No results for  input(s): INR, PROTIME in the last 168 hours. Cardiac Enzymes: No results for input(s): CKTOTAL, CKMB, CKMBINDEX, TROPONINI in the last 168 hours. BNP (last 3 results) No results for input(s): PROBNP in the last 8760 hours. HbA1C: Recent Labs    08/20/18 0348  HGBA1C 8.7*   CBG: Recent Labs  Lab 08/20/18 2200 08/21/18 0000 08/21/18 0355 08/21/18 0810 08/21/18 1107  GLUCAP 233* 235* 162* 191* 213*   Lipid Profile: No results for input(s): CHOL, HDL, LDLCALC, TRIG, CHOLHDL, LDLDIRECT in the last 72 hours. Thyroid Function Tests: No results for input(s): TSH, T4TOTAL, FREET4, T3FREE, THYROIDAB in the last 72 hours. Anemia Panel: No results for input(s): VITAMINB12, FOLATE, FERRITIN, TIBC, IRON, RETICCTPCT in the last 72 hours. Urine analysis:    Component Value Date/Time   COLORURINE YELLOW 02/13/2018 1335   APPEARANCEUR HAZY (A) 02/13/2018 1335   APPEARANCEUR Clear 12/31/2017 0829   LABSPEC 1.018 02/13/2018 1335   PHURINE 5.0 02/13/2018 1335   GLUCOSEU NEGATIVE 02/13/2018 1335   HGBUR LARGE (A) 02/13/2018 1335   BILIRUBINUR NEGATIVE 02/13/2018 1335   BILIRUBINUR Negative 12/31/2017 0829   KETONESUR NEGATIVE 02/13/2018 1335   PROTEINUR 30 (A) 02/13/2018 1335   UROBILINOGEN 0.2 04/02/2014 1424   NITRITE NEGATIVE 02/13/2018 1335   LEUKOCYTESUR MODERATE (A) 02/13/2018 1335   LEUKOCYTESUR 2+ (A) 12/31/2017 0829   Sepsis Labs: @LABRCNTIP (procalcitonin:4,lacticidven:4)  ) Recent Results (from the past 240 hour(s))  Surgical pcr screen     Status: None   Collection Time: 08/20/18  9:13 AM  Result Value Ref Range Status   MRSA, PCR NEGATIVE NEGATIVE Final   Staphylococcus aureus NEGATIVE NEGATIVE Final    Comment: (NOTE) The Xpert SA Assay (FDA approved for NASAL specimens in patients 74 years of age and older), is one component of a comprehensive surveillance program. It is not intended to diagnose infection nor to guide or monitor treatment. Performed at [facility], [address], [place], [place] [address]          Radiology Studies: Dg Chest 1 View  Result Date: 08/19/2018 CLINICAL DATA:  Larey Seat.  Right hip fracture. EXAM: CHEST  1 VIEW COMPARISON:  02/09/2018 FINDINGS: The heart is enlarged but stable. There is tortuosity and mild ectasia of the thoracic aorta. Prominent mediastinal contours due to the supine position the patient and the AP projection of the film. The lungs are clear. No pneumothorax. Stable eventration right hemidiaphragm. The bony thorax is intact. IMPRESSION: No acute cardiopulmonary findings. Stable cardiac enlargement and prominent mediastinal contours due to the supine position the patient and the AP projection of the film. Electronically Signed   By: Rudie Meyer M.D.   On: 08/19/2018 21:59   Ct Head Wo Contrast  Result Date: 08/19/2018 CLINICAL DATA:  Fall at home. EXAM: CT HEAD WITHOUT CONTRAST CT CERVICAL SPINE WITHOUT CONTRAST TECHNIQUE: Multidetector CT imaging of the head and cervical spine was performed following  the standard protocol without intravenous contrast. Multiplanar CT image reconstructions of the cervical spine were also generated. COMPARISON:  None. FINDINGS: CT HEAD FINDINGS Brain: Mild diffuse cortical atrophy is noted. Mild chronic ischemic white matter disease is noted. No mass effect or midline shift is noted. Ventricular size is within normal limits. There is no evidence of mass lesion, hemorrhage or acute infarction. Vascular: No hyperdense vessel or unexpected calcification. Skull: Normal. Negative for fracture or focal lesion. Sinuses/Orbits: No acute finding. Other: Large right posterior scalp hematoma is noted. CT CERVICAL SPINE FINDINGS Alignment: Grade 1 anterolisthesis of C4-5 is noted secondary to posterior facet joint hypertrophy. Skull base and vertebrae: No acute fracture. No primary bone lesion or focal pathologic process. Soft tissues and spinal canal: No prevertebral fluid or swelling.  No visible canal hematoma. Disc levels: Severe degenerative disc disease is noted at C4-5 and C5-6 with anterior osteophyte formation. Upper chest: Negative. Other: Degenerative changes are seen involving the posterior facet joints bilaterally. IMPRESSION: Mild diffuse cortical atrophy. Mild chronic ischemic white matter disease. Large right posterior scalp hematoma. No acute intracranial abnormality seen. Severe multilevel degenerative disc disease. No acute abnormality seen in the cervical spine. Electronically Signed   By: Lupita Raider, M.D.   On: 08/19/2018 21:55   Ct Cervical Spine Wo Contrast  Result Date: 08/19/2018 CLINICAL DATA:  Fall at home. EXAM: CT HEAD WITHOUT CONTRAST CT CERVICAL SPINE WITHOUT CONTRAST TECHNIQUE: Multidetector CT imaging of the head and cervical spine was performed following the standard protocol without intravenous contrast. Multiplanar CT image reconstructions of the cervical spine were also generated. COMPARISON:  None. FINDINGS: CT HEAD FINDINGS Brain: Mild diffuse cortical atrophy is noted. Mild chronic ischemic white matter disease is noted. No mass effect or midline shift is noted. Ventricular size is within normal limits. There is no evidence of mass lesion, hemorrhage or acute infarction. Vascular: No hyperdense vessel or unexpected calcification. Skull: Normal. Negative for fracture or focal lesion. Sinuses/Orbits: No acute finding. Other: Large right posterior scalp hematoma is noted. CT CERVICAL SPINE FINDINGS Alignment: Grade 1 anterolisthesis of C4-5 is noted secondary to posterior facet joint hypertrophy. Skull base and vertebrae: No acute fracture. No primary bone lesion or focal pathologic process. Soft tissues and spinal canal: No prevertebral fluid or swelling. No visible canal hematoma. Disc levels: Severe degenerative disc disease is noted at C4-5 and C5-6 with anterior osteophyte formation. Upper chest: Negative. Other: Degenerative changes are seen  involving the posterior facet joints bilaterally. IMPRESSION: Mild diffuse cortical atrophy. Mild chronic ischemic white matter disease. Large right posterior scalp hematoma. No acute intracranial abnormality seen. Severe multilevel degenerative disc disease. No acute abnormality seen in the cervical spine. Electronically Signed   By: Lupita Raider, M.D.   On: 08/19/2018 21:55   Dg C-arm 1-60 Min  Result Date: 08/20/2018 CLINICAL DATA:  Patient status post ORIF proximal right femur fracture. EXAM: RIGHT FEMUR 2 VIEWS; DG C-ARM 61-120 MIN COMPARISON:  Right femur radiograph 08/19/2018 FINDINGS: Patient status post intramedullary rod fixation of right intertrochanteric femur fracture. Improved anatomic alignment. Hardware appears in appropriate position. IMPRESSION: Patient status post ORIF proximal right femur fracture. Electronically Signed   By: Annia Belt M.D.   On: 08/20/2018 14:17   Dg Hip Unilat W Or Wo Pelvis 2-3 Views Right  Result Date: 08/19/2018 CLINICAL DATA:  Larey Seat.  Right hip pain. EXAM: DG HIP (WITH OR WITHOUT PELVIS) 2-3V RIGHT COMPARISON:  06/22/2018 FINDINGS: Stable surgical changes from left hip fracture fixation  with a gamma nail and compression screw. Progressive healing changes. Advanced left hip joint degenerative changes. There is a displaced intertrochanteric fracture of the right hip with a marked varus deformity. The pubic symphysis and SI joints are intact. No pelvic fractures. IMPRESSION: New/acute displaced intertrochanteric fracture of the right hip. Remote healed/healing left hip fracture. No definite pelvic fractures. Electronically Signed   By: Rudie Meyer M.D.   On: 08/19/2018 21:58   Dg Femur, Min 2 Views Right  Result Date: 08/20/2018 CLINICAL DATA:  Patient status post ORIF proximal right femur fracture. EXAM: RIGHT FEMUR 2 VIEWS; DG C-ARM 61-120 MIN COMPARISON:  Right femur radiograph 08/19/2018 FINDINGS: Patient status post intramedullary rod fixation of right  intertrochanteric femur fracture. Improved anatomic alignment. Hardware appears in appropriate position. IMPRESSION: Patient status post ORIF proximal right femur fracture. Electronically Signed   By: Annia Belt M.D.   On: 08/20/2018 14:17        Scheduled Meds: . aspirin EC  325 mg Oral Q breakfast  . docusate sodium  100 mg Oral BID  . insulin aspart  0-15 Units Subcutaneous Q4H  . levothyroxine  137 mcg Oral QAC breakfast  . metoprolol tartrate  100 mg Oral BID  . pantoprazole  40 mg Oral Daily   Continuous Infusions: . sodium chloride 100 mL/hr at 08/20/18 1900  . lactated ringers 10 mL/hr at 08/20/18 1104  . methocarbamol (ROBAXIN) IV       LOS: 2 days    Time spent: 25 minutes.    Berton Mount, MD  [facility] Pager #: [phone] 7PM-7AM contact night coverage as above

## 2018-08-21 NOTE — Progress Notes (Addendum)
Inpatient Diabetes Program Recommendations  AACE/ADA: New Consensus Statement on Inpatient Glycemic Control (2015)  Target Ranges:  Prepandial:   less than 140 mg/dL      Peak postprandial:   less than 180 mg/dL (1-2 hours)      Critically ill patients:  140 - 180 mg/dL   Lab Results  Component Value Date   GLUCAP 213 (H) 08/21/2018   HGBA1C 8.7 (H) 08/20/2018    Review of Glycemic Control Results for Meredith Shaw, Meredith Shaw (MRN [id number]) as of 08/21/2018 13:09  Ref. Range 08/21/2018 00:00 08/21/2018 03:55 08/21/2018 08:10 08/21/2018 11:07  Glucose-Capillary Latest Ref Range: 70 - 99 mg/dL 841 (H) 324 (H) 401 (H) 213 (H)   Diabetes history: Type 2 DM Outpatient Diabetes medications: Metformin 1000 mg BID Current orders for Inpatient glycemic control: Novolog 0-15 units Q4H  Inpatient Diabetes Program Recommendations:    While inpatient, consider starting Lantus 8 units QHS and switching to Novolog 0-15 units TID and Novolog 0-5 units QHS.   Thanks, Lujean Rave, MSN, RNC-OB Diabetes Coordinator [phone] (8a-5p)

## 2018-08-21 NOTE — Discharge Instructions (Signed)
Full weight bearing as tolerated on your right hip. Change the dressing as needed.

## 2018-08-21 NOTE — Progress Notes (Signed)
Subjective: 1 Day Post-Op Procedure(s) (LRB): ORIF RIGHT HIP INTERTROC FRACTURE (Right) Patient reports pain as moderate.  Tolerated surgery well yesterday.  Has been up with therapy.  Short-term SNF has been recommended according to the patient. Mild acute blood loss anemia from her fracture and surgery - tolerating well.  Objective: Vital signs in last 24 hours: Temp:  [97.3 F (36.3 C)-99.5 F (37.5 C)] 99.1 F (37.3 C) (11/11 0352) Pulse Rate:  [54-77] 71 (11/11 0834) Resp:  [12-16] 16 (11/10 1511) BP: (77-179)/(55-88) 152/81 (11/11 0834) SpO2:  [92 %-99 %] 96 % (11/11 0352)  Intake/Output from previous day: 11/10 0701 - 11/11 0700 In: 1374 [I.V.:1324; IV Piggyback:50] Out: 725 [Urine:675; Blood:50] Intake/Output this shift: No intake/output data recorded.  Recent Labs    08/19/18 2105 08/20/18 0348 08/21/18 0302  HGB 13.8 11.8* 10.3*   Recent Labs    08/20/18 0348 08/21/18 0302  WBC 11.6* 10.8*  RBC 4.10 3.53*  HCT 35.0* 30.1*  PLT 209 184   Recent Labs    08/20/18 0348 08/21/18 0302  NA 138 138  K 4.0 3.5  CL 107 106  CO2 21* 23  BUN 22 16  CREATININE 1.25* 1.20*  GLUCOSE 406* 201*  CALCIUM 9.4 9.0   No results for input(s): LABPT, INR in the last 72 hours.  Sensation intact distally Intact pulses distally Dorsiflexion/Plantar flexion intact Incision: scant drainage   Assessment/Plan: 1 Day Post-Op Procedure(s) (LRB): ORIF RIGHT HIP INTERTROC FRACTURE (Right) Up with therapy - WBAT right hip    Kathryne Hitch 08/21/2018, 12:01 PM

## 2018-08-21 NOTE — Plan of Care (Signed)
  Problem: Health Behavior/Discharge Planning: Goal: Ability to manage health-related needs will improve Outcome: Progressing   Problem: Activity: Goal: Risk for activity intolerance will decrease Outcome: Progressing   Problem: Safety: Goal: Ability to remain free from injury will improve Outcome: Progressing   

## 2018-08-21 NOTE — Evaluation (Signed)
Physical Therapy Evaluation Patient Details [name] MRN: [id number] DOB: Mar 01, 1946 Today's Date: 08/21/2018   History of Present Illness  Pt is a 72 yo female admitted after a fall with a R hip fx.  Pt underwent an ORIF of R  hip.  Pt with history of DM, HTN, B TKR, L ORIF from fall in 02/2018.  Clinical Impression   Patient is s/p above surgery resulting in functional limitations due to the deficits listed below (see PT Problem List). Pt has history of falls; Presents to PT with decr functional mobility, decr activity tolerance incr fall risk;  Patient will benefit from skilled PT to increase their independence and safety with mobility to allow discharge to the venue listed below.       Follow Up Recommendations SNF    Equipment Recommendations  Rolling walker with 5" wheels;3in1 (PT)    Recommendations for Other Services       Precautions / Restrictions Precautions Precautions: Fall Precaution Comments: pt fell in may with fx to LLE Restrictions RLE Weight Bearing: Weight bearing as tolerated      Mobility  Bed Mobility                  Transfers Overall transfer level: Needs assistance Equipment used: Rolling walker (2 wheeled) Transfers: Sit to/from Stand Sit to Stand: Mod assist         General transfer comment: Pt required a great amount of assist to power up.  Pt encouraged to lean forward and really use BUEs and L leg to assist.   Ambulation/Gait Ambulation/Gait assistance: Mod assist;+2 safety/equipment Gait Distance (Feet): 15 Feet Assistive device: Rolling walker (2 wheeled) Gait Pattern/deviations: Step-to pattern;Decreased stance time - right     General Gait Details: Cues for sequence and to use RW to unweigh painful RLE in stance; heav dependence on UE suport  Stairs            Wheelchair Mobility    Modified Rankin (Stroke Patients Only)       Balance     Sitting balance-Leahy Scale: Good       Standing  balance-Leahy Scale: Poor Standing balance comment: Pt dependent on outside support to remain standing.                             Pertinent Vitals/Pain Pain Assessment: Faces Faces Pain Scale: Hurts even more Pain Location: R hip Pain Descriptors / Indicators: Aching;Operative site guarding;Discomfort Pain Intervention(s): Monitored during session    Home Living Family/patient expects to be discharged to:: Private residence Living Arrangements: Spouse/significant other Available Help at Discharge: Family;Available 24 hours/day Type of Home: House Home Access: Stairs to enter Entrance Stairs-Rails: None Entrance Stairs-Number of Steps: 2 Home Layout: One level Home Equipment: Walker - 2 wheels;Bedside commode;Shower seat;Cane - single point;Hand held shower head;Other (comment);Grab bars - toilet;Grab bars - tub/shower;Adaptive equipment;Wheelchair - manual      Prior Function Level of Independence: Needs assistance   Gait / Transfers Assistance Needed: Assistive device  ADL's / Homemaking Assistance Needed: required assist donning L sock and shoe. Has equipment and knows how to use but husband does it quicker.        Hand Dominance   Dominant Hand: Right    Extremity/Trunk Assessment   Upper Extremity Assessment Upper Extremity Assessment: Defer to OT evaluation    Lower Extremity Assessment Lower Extremity Assessment: RLE deficits/detail RLE Deficits / Details: Grossly decr aROM  and strength, limited by pain postop RLE: Unable to fully assess due to pain       Communication   Communication: No difficulties  Cognition Arousal/Alertness: Awake/alert Behavior During Therapy: WFL for tasks assessed/performed Overall Cognitive Status: Within Functional Limits for tasks assessed                                        General Comments      Exercises     Assessment/Plan    PT Assessment Patient needs continued PT services  PT  Problem List Decreased strength;Decreased range of motion;Decreased activity tolerance;Decreased balance;Decreased mobility;Decreased coordination;Decreased knowledge of use of DME;Decreased safety awareness;Decreased knowledge of precautions;Pain       PT Treatment Interventions DME instruction;Gait training;Functional mobility training;Therapeutic activities;Stair training;Therapeutic exercise;Patient/family education    PT Goals (Current goals can be found in the Care Plan section)  Acute Rehab PT Goals Patient Stated Goal: to be independent once again PT Goal Formulation: With patient Time For Goal Achievement: 09/04/18 Potential to Achieve Goals: Good    Frequency Min 2X/week   Barriers to discharge Other (comment) Patient is wondering if her SNF stay earler this year effects her eligibility to go to SNF now    Co-evaluation               AM-PAC PT "6 Clicks" Daily Activity  Outcome Measure Difficulty turning over in bed (including adjusting bedclothes, sheets and blankets)?: A Lot Difficulty moving from lying on back to sitting on the side of the bed? : Unable Difficulty sitting down on and standing up from a chair with arms (e.g., wheelchair, bedside commode, etc,.)?: Unable Help needed moving to and from a bed to chair (including a wheelchair)?: A Lot Help needed walking in hospital room?: A Lot Help needed climbing 3-5 steps with a railing? : A Lot 6 Click Score: 10    End of Session Equipment Utilized During Treatment: Gait belt Activity Tolerance: Patient limited by pain;Patient tolerated treatment well Patient left: in chair;with call bell/phone within reach Nurse Communication: Mobility status PT Visit Diagnosis: Unsteadiness on feet (R26.81);Other abnormalities of gait and mobility (R26.89);Repeated falls (R29.6);Muscle weakness (generalized) (M62.81);Pain Pain - Right/Left: Right Pain - part of body: Hip    Time: 1100-1125 PT Time Calculation (min)  (ACUTE ONLY): 25 min   Charges:   PT Evaluation $PT Eval Moderate Complexity: 1 Mod PT Treatments $Gait Training: 8-22 mins        Van Clines, PT  Acute Rehabilitation Services Pager [phone] Office [phone]   Levi Aland 08/21/2018, 2:15 PM

## 2018-08-22 DIAGNOSIS — W19XXXD Unspecified fall, subsequent encounter: Secondary | ICD-10-CM | POA: Diagnosis not present

## 2018-08-22 DIAGNOSIS — Z7982 Long term (current) use of aspirin: Secondary | ICD-10-CM | POA: Diagnosis not present

## 2018-08-22 DIAGNOSIS — E1122 Type 2 diabetes mellitus with diabetic chronic kidney disease: Secondary | ICD-10-CM | POA: Diagnosis not present

## 2018-08-22 DIAGNOSIS — Z7401 Bed confinement status: Secondary | ICD-10-CM | POA: Diagnosis not present

## 2018-08-22 DIAGNOSIS — E559 Vitamin D deficiency, unspecified: Secondary | ICD-10-CM | POA: Diagnosis not present

## 2018-08-22 DIAGNOSIS — E118 Type 2 diabetes mellitus with unspecified complications: Secondary | ICD-10-CM | POA: Diagnosis not present

## 2018-08-22 DIAGNOSIS — I10 Essential (primary) hypertension: Secondary | ICD-10-CM | POA: Diagnosis not present

## 2018-08-22 DIAGNOSIS — M25551 Pain in right hip: Secondary | ICD-10-CM | POA: Diagnosis not present

## 2018-08-22 DIAGNOSIS — S72141D Displaced intertrochanteric fracture of right femur, subsequent encounter for closed fracture with routine healing: Secondary | ICD-10-CM | POA: Diagnosis not present

## 2018-08-22 DIAGNOSIS — S72141A Displaced intertrochanteric fracture of right femur, initial encounter for closed fracture: Secondary | ICD-10-CM | POA: Diagnosis not present

## 2018-08-22 DIAGNOSIS — M8000XA Age-related osteoporosis with current pathological fracture, unspecified site, initial encounter for fracture: Secondary | ICD-10-CM | POA: Diagnosis not present

## 2018-08-22 DIAGNOSIS — M25552 Pain in left hip: Secondary | ICD-10-CM | POA: Diagnosis not present

## 2018-08-22 DIAGNOSIS — Z7984 Long term (current) use of oral hypoglycemic drugs: Secondary | ICD-10-CM | POA: Diagnosis not present

## 2018-08-22 DIAGNOSIS — I129 Hypertensive chronic kidney disease with stage 1 through stage 4 chronic kidney disease, or unspecified chronic kidney disease: Secondary | ICD-10-CM | POA: Diagnosis not present

## 2018-08-22 DIAGNOSIS — M810 Age-related osteoporosis without current pathological fracture: Secondary | ICD-10-CM | POA: Diagnosis not present

## 2018-08-22 DIAGNOSIS — M8000XD Age-related osteoporosis with current pathological fracture, unspecified site, subsequent encounter for fracture with routine healing: Secondary | ICD-10-CM | POA: Diagnosis not present

## 2018-08-22 DIAGNOSIS — M2550 Pain in unspecified joint: Secondary | ICD-10-CM | POA: Diagnosis not present

## 2018-08-22 DIAGNOSIS — E039 Hypothyroidism, unspecified: Secondary | ICD-10-CM | POA: Diagnosis not present

## 2018-08-22 DIAGNOSIS — Z8781 Personal history of (healed) traumatic fracture: Secondary | ICD-10-CM | POA: Diagnosis not present

## 2018-08-22 DIAGNOSIS — K5900 Constipation, unspecified: Secondary | ICD-10-CM | POA: Diagnosis not present

## 2018-08-22 DIAGNOSIS — E782 Mixed hyperlipidemia: Secondary | ICD-10-CM | POA: Diagnosis not present

## 2018-08-22 DIAGNOSIS — N183 Chronic kidney disease, stage 3 (moderate): Secondary | ICD-10-CM | POA: Diagnosis not present

## 2018-08-22 DIAGNOSIS — M1990 Unspecified osteoarthritis, unspecified site: Secondary | ICD-10-CM | POA: Diagnosis not present

## 2018-08-22 LAB — GLUCOSE, CAPILLARY
GLUCOSE-CAPILLARY: 161 mg/dL — AB (ref 70–99)
Glucose-Capillary: 141 mg/dL — ABNORMAL HIGH (ref 70–99)
Glucose-Capillary: 150 mg/dL — ABNORMAL HIGH (ref 70–99)
Glucose-Capillary: 166 mg/dL — ABNORMAL HIGH (ref 70–99)
Glucose-Capillary: 182 mg/dL — ABNORMAL HIGH (ref 70–99)

## 2018-08-22 LAB — BASIC METABOLIC PANEL
ANION GAP: 8 (ref 5–15)
BUN: 22 mg/dL (ref 8–23)
CO2: 24 mmol/L (ref 22–32)
Calcium: 8.6 mg/dL — ABNORMAL LOW (ref 8.9–10.3)
Chloride: 106 mmol/L (ref 98–111)
Creatinine, Ser: 1.39 mg/dL — ABNORMAL HIGH (ref 0.44–1.00)
GFR calc Af Amer: 43 mL/min — ABNORMAL LOW (ref >60)
GFR, EST NON AFRICAN AMERICAN: 37 mL/min — AB (ref >60)
GLUCOSE: 177 mg/dL — AB (ref 70–99)
POTASSIUM: 3.4 mmol/L — AB (ref 3.5–5.1)
Sodium: 138 mmol/L (ref 135–145)

## 2018-08-22 LAB — CBC
HEMATOCRIT: 28.4 % — AB (ref 36.0–46.0)
HEMOGLOBIN: 9.6 g/dL — AB (ref 12.0–15.0)
MCH: 29.4 pg (ref 26.0–34.0)
MCHC: 33.8 g/dL (ref 30.0–36.0)
MCV: 87.1 fL (ref 80.0–100.0)
Platelets: 164 10*3/uL (ref 150–400)
RBC: 3.26 MIL/uL — AB (ref 3.87–5.11)
RDW: 14.4 % (ref 11.5–15.5)
WBC: 10 10*3/uL (ref 4.0–10.5)
nRBC: 0 % (ref 0.0–0.2)

## 2018-08-22 MED ORDER — PRAVASTATIN SODIUM 40 MG PO TABS: 40.0000 mg | ORAL_TABLET | Freq: Every day | ORAL | Status: DC

## 2018-08-22 MED ORDER — AMLODIPINE BESYLATE 10 MG PO TABS
10.0000 mg | ORAL_TABLET | Freq: Every day | ORAL | Status: DC
  Administered 2018-08-22: 10 mg via ORAL
  Filled 2018-08-22: qty 1

## 2018-08-22 MED ORDER — LISINOPRIL 20 MG PO TABS
20.0000 mg | ORAL_TABLET | Freq: Every day | ORAL | Status: DC
  Administered 2018-08-22: 20 mg via ORAL
  Filled 2018-08-22: qty 1

## 2018-08-22 MED ORDER — VITAMIN D 25 MCG (1000 UNIT) PO TABS
5000.0000 [IU] | ORAL_TABLET | Freq: Every day | ORAL | Status: DC
  Administered 2018-08-22: 5000 [IU] via ORAL

## 2018-08-22 MED ORDER — POTASSIUM CHLORIDE CRYS ER 20 MEQ PO TBCR
40.0000 meq | EXTENDED_RELEASE_TABLET | Freq: Once | ORAL | Status: AC
  Administered 2018-08-22: 40 meq via ORAL
  Filled 2018-08-22: qty 2

## 2018-08-22 MED ORDER — HYDROCODONE-ACETAMINOPHEN 5-325 MG PO TABS: 1.0000 | ORAL_TABLET | Freq: Four times a day (QID) | ORAL | 0 refills | Status: AC | PRN

## 2018-08-22 MED ORDER — ASPIRIN 325 MG PO TBEC: 325.0000 mg | DELAYED_RELEASE_TABLET | Freq: Every day | ORAL | 0 refills | Status: AC

## 2018-08-22 NOTE — Clinical Social Work Placement (Signed)
   CLINICAL SOCIAL WORK PLACEMENT  NOTE  Date:  08/22/2018  Patient Details  [name] MRN: [id number] Date of Birth: Jun 22, 1946  Clinical Social Work is seeking post-discharge placement for this patient at the Skilled  Nursing Facility level of care (*CSW will initial, date and re-position this form in  chart as items are completed):  Yes   Patient/family provided with [place] Clinical Social Work Department's list of facilities offering this level of care within the geographic area requested by the patient (or if unable, by the patient's family).  Yes   Patient/family informed of their freedom to choose among providers that offer the needed level of care, that participate in Medicare, Medicaid or managed care program needed by the patient, have an available bed and are willing to accept the patient.  Yes   Patient/family informed of [place]'s ownership interest in [facility] and [facility], as well as of the fact that they are under no obligation to receive care at these facilities.  PASRR submitted to EDS on       PASRR number received on 08/21/18     Existing PASRR number confirmed on       FL2 transmitted to all facilities in geographic area requested by pt/family on 08/21/18     FL2 transmitted to all facilities within larger geographic area on       Patient informed that his/her managed care company has contracts with or will negotiate with certain facilities, including the following:        Yes   Patient/family informed of bed offers received.  Patient chooses bed at [facility]     Physician recommends and patient chooses bed at      Patient to be transferred to [facility] on 08/22/18.  Patient to be transferred to facility by [facility]     Patient family notified on 08/22/18 of transfer.  Name of family member notified:  [name] (daughter) [phone]     PHYSICIAN       Additional Comment:     _______________________________________________ Gildardo Griffes, LCSW 08/22/2018, 4:18 PM

## 2018-08-22 NOTE — Care Management Important Message (Signed)
Important Message  Patient Details  [name] MRN: [id number] Date of Birth: June 23, 1946   Medicare Important Message Given:  Yes    Elliot Cousin, RN 08/22/2018, 3:13 PM

## 2018-08-22 NOTE — Progress Notes (Signed)
RN spoke to [facility] ED liaison and explained the current situation, who stated that pt still needs to go, and that he would reach out to the daughter.

## 2018-08-22 NOTE — Progress Notes (Signed)
Pt daughter called and stated that she did not want her mother moved tonight per her words " it is getting too late to move her." and wants [facility] cancelled and her to leave in the AM. Hospitalist notified.

## 2018-08-22 NOTE — Progress Notes (Signed)
SW told RN to call [facility] at [phone] and see how long they would take if it was going to be a while to tell them to come in the AM. RN called [facility] they stated the pt would be picked up within the hour.

## 2018-08-22 NOTE — Progress Notes (Signed)
Pt signed scripts on the chart along with AVS.

## 2018-08-22 NOTE — Progress Notes (Signed)
PROGRESS NOTE    Meredith Shaw  UXL:[id number] DOB: 08/02/1946 DOA: 08/19/2018 PCP: Johna Sheriff, MD  Brief Narrative:  72 year old with past medical history relevant for osteoarthritis, type 2 diabetes on oral hypoglycemics, hypothyroidism, hypertension, osteoporosis admitted with mechanical fall and found to have right intertrochanteric hip fracture status post ORIF on 08/20/2018.   Assessment & Plan:   Principal Problem:   Closed fracture of femur, intertrochanteric, right, initial encounter (HCC) Active Problems:   Diabetes (HCC)   Hypertension   Hypothyroidism   Hyperlipidemia, mixed   CKD (chronic kidney disease) stage 3, GFR 30-59 ml/min (HCC)   Controlled type 2 diabetes mellitus with hyperglycemia (HCC)   Hip fracture (HCC)   #) Right hip fracture status post ORIF on 08/20/2017: Patient is doing well postoperatively. - Pending skilled nursing facility placement -Pain control - 325 mg daily for prophylaxis  #) Hypothyroidism: -Continue levothyroxine 137 mcg daily  #)  hypertension/hyperlipidemia: -Continue pravastatin 40 mg daily -continue amlodipine 10 mg daily -Continue lisinopril 10 mg daily -Continue metoprolol tartrate 100 mg twice daily  #) Type 2 diabetes: - Hold metformin thousand milligrams twice daily -Sliding scale insulin, SHS  #) Stage III CKD: - Stable, avoid nephrotoxins  Fluids: Tolerating p.o. Electrolytes: Monitor and supplement Nutrition: Carb restricted diet  Prophylaxis: Aspirin  Disposition: Pending skilled nursing facility placement  Full code  Consultants:   Orthopedic surgery, Dr. Magnus Ivan  Procedures:   ORIF of right intertrochanteric hip fracture on 08/20/2018  Antimicrobials:   Perioperative antibiotics   Subjective: This morning patient is sleeping in bed but she reports that she is doing fairly well.  She denies any nausea, vomiting, diarrhea, cough, congestion.  She reports that she is having some pain  in her leg but this is manageable.  Objective: Vitals:   08/21/18 1954 08/22/18 0043 08/22/18 0425 08/22/18 0854  BP: 122/70 125/69 (!) 161/77 138/72  Pulse: 92 60 66 70  Resp: 16 20 14    Temp: 98.6 F (37 C) (!) 97.5 F (36.4 C) 97.8 F (36.6 C) 97.7 F (36.5 C)  TempSrc: Oral Oral Oral Oral  SpO2: 97% 95% 92% 91%  Weight:      Height:        Intake/Output Summary (Last 24 hours) at 08/22/2018 0951 Last data filed at 08/21/2018 1759 Gross per 24 hour  Intake 299.66 ml  Output -  Net 299.66 ml   Filed Weights   08/19/18 2054  Weight: 77.1 kg    Examination:  General exam: Appears calm and comfortable  Respiratory system: Clear to auscultation. Respiratory effort normal. Cardiovascular system: Regular rate and rhythm, no murmurs Gastrointestinal system: Soft, nondistended, no rebound or guarding, plus bowel sounds Central nervous system: Alert and oriented.  Grossly intact, moving all extremities Extremities: No lower extremity edema, right lower extremity limited by pain but intact distal pulses Skin: Incision site is clean dry and intact Psychiatry: Judgement and insight appear normal. Mood & affect appropriate.     Data Reviewed: I have personally reviewed following labs and imaging studies  CBC: Recent Labs  Lab 08/19/18 2105 08/20/18 0348 08/21/18 0302 08/22/18 0223  WBC 13.4* 11.6* 10.8* 10.0  NEUTROABS 11.1*  --   --   --   HGB 13.8 11.8* 10.3* 9.6*  HCT 41.9 35.0* 30.1* 28.4*  MCV 87.1 85.4 85.3 87.1  PLT 236 209 184 164   Basic Metabolic Panel: Recent Labs  Lab 08/19/18 2105 08/20/18 0348 08/21/18 0302 08/22/18 0223  NA 139 138  138 138  K 3.7 4.0 3.5 3.4*  CL 105 107 106 106  CO2 21* 21* 23 24  GLUCOSE 365* 406* 201* 177*  BUN 25* 22 16 22   CREATININE 1.30* 1.25* 1.20* 1.39*  CALCIUM 9.9 9.4 9.0 8.6*   GFR: Estimated Creatinine Clearance: 39.2 mL/min (A) (by C-G formula based on SCr of 1.39 mg/dL (H)). Liver Function Tests: Recent  Labs  Lab 08/19/18 2105 08/20/18 0348  AST 21  --   ALT 19  --   ALKPHOS 81  --   BILITOT 0.3  --   PROT 7.2  --   ALBUMIN 3.8 3.1*   No results for input(s): LIPASE, AMYLASE in the last 168 hours. No results for input(s): AMMONIA in the last 168 hours. Coagulation Profile: No results for input(s): INR, PROTIME in the last 168 hours. Cardiac Enzymes: No results for input(s): CKTOTAL, CKMB, CKMBINDEX, TROPONINI in the last 168 hours. BNP (last 3 results) No results for input(s): PROBNP in the last 8760 hours. HbA1C: Recent Labs    08/20/18 0348  HGBA1C 8.7*   CBG: Recent Labs  Lab 08/21/18 1107 08/21/18 1611 08/21/18 1951 08/22/18 0040 08/22/18 0422  GLUCAP 213* 192* 222* 182* 161*   Lipid Profile: No results for input(s): CHOL, HDL, LDLCALC, TRIG, CHOLHDL, LDLDIRECT in the last 72 hours. Thyroid Function Tests: Recent Labs    08/21/18 1823  TSH 1.295   Anemia Panel: No results for input(s): VITAMINB12, FOLATE, FERRITIN, TIBC, IRON, RETICCTPCT in the last 72 hours. Sepsis Labs: No results for input(s): PROCALCITON, LATICACIDVEN in the last 168 hours.  Recent Results (from the past 240 hour(s))  Surgical pcr screen     Status: None   Collection Time: 08/20/18  9:13 AM  Result Value Ref Range Status   MRSA, PCR NEGATIVE NEGATIVE Final   Staphylococcus aureus NEGATIVE NEGATIVE Final    Comment: (NOTE) The Xpert SA Assay (FDA approved for NASAL specimens in patients 73 years of age and older), is one component of a comprehensive surveillance program. It is not intended to diagnose infection nor to guide or monitor treatment. Performed at [facility], [address] [place], [place] [address]          Radiology Studies: Dg C-arm 1-60 Min  Result Date: 08/20/2018 CLINICAL DATA:  Patient status post ORIF proximal right femur fracture. EXAM: RIGHT FEMUR 2 VIEWS; DG C-ARM 61-120 MIN COMPARISON:  Right femur radiograph 08/19/2018 FINDINGS: Patient  status post intramedullary rod fixation of right intertrochanteric femur fracture. Improved anatomic alignment. Hardware appears in appropriate position. IMPRESSION: Patient status post ORIF proximal right femur fracture. Electronically Signed   By: Annia Belt M.D.   On: 08/20/2018 14:17   Dg Femur, Min 2 Views Right  Result Date: 08/20/2018 CLINICAL DATA:  Patient status post ORIF proximal right femur fracture. EXAM: RIGHT FEMUR 2 VIEWS; DG C-ARM 61-120 MIN COMPARISON:  Right femur radiograph 08/19/2018 FINDINGS: Patient status post intramedullary rod fixation of right intertrochanteric femur fracture. Improved anatomic alignment. Hardware appears in appropriate position. IMPRESSION: Patient status post ORIF proximal right femur fracture. Electronically Signed   By: Annia Belt M.D.   On: 08/20/2018 14:17        Scheduled Meds: . aspirin EC  325 mg Oral Q breakfast  . docusate sodium  100 mg Oral BID  . insulin aspart  0-15 Units Subcutaneous Q4H  . levothyroxine  137 mcg Oral QAC breakfast  . metoprolol tartrate  100 mg Oral BID  .  pantoprazole  40 mg Oral Daily   Continuous Infusions: . sodium chloride 20 mL/hr at 08/21/18 1540  . lactated ringers 10 mL/hr at 08/20/18 1104  . methocarbamol (ROBAXIN) IV       LOS: 3 days    Time spent: 35    Delaine Lame, MD [facility]  If 7PM-7AM, please contact night-coverage [email or web address] Password [facility] 08/22/2018, 9:51 AM

## 2018-08-22 NOTE — Progress Notes (Signed)
Patient will DC to: [facility] Anticipated DC date: 08/22/18 Family notified: Rosey Batheresa Transport by: Sharin MonsPTAR  Per MD [facility]. RN, patient, patient's family, and facility notified of DC. Discharge Summary sent to facility. RN given number for report [phone]. DC packet on chart. Ambulance transport requested for patient.  CSW signing off.  RialtoAshley Yoneko [place], [place] [phone]

## 2018-08-22 NOTE — Progress Notes (Signed)
Patient ID: Meredith FiremanMarie S Shaw, female   DOB: [birth year], 72 y.o.   MRN: [id number] Looks good overall.  Ready for skilled nursing.  Right hip stable.

## 2018-08-22 NOTE — Progress Notes (Signed)
RN called [employer] operational [employer] and gave report to [place], Charity fundraiser. Pt and family notified. I will remove pt IV, daughter helped pt pack up belongings. Awaiting

## 2018-08-22 NOTE — Discharge Summary (Signed)
Physician Discharge Summary  Meredith Shaw WPY:[id number] DOB: September 30, 1946 DOA: 08/19/2018  PCP: Eustaquio Maize, MD  Admit date: 08/19/2018 Discharge date: 08/22/2018  Admitted From: Home Disposition:  SNF Recommendations for Outpatient Follow-up:  1. Follow up with PCP in 1-2 weeks 2. Please obtain BMP/CBC in one week   Home Health: No Equipment/Devices: No  Discharge Condition: stable CODE STATUS: FULL Diet recommendation: Heart Healthy / Carb Modified  Brief/Interim Summary:  #) Right hip fracture status post open reduction internal fixation on 08/20/2018: Patient was admitted after mechanical fall.  She did have an ORIF on 08/20/2018.  She did well postoperatively and was evaluated by physical therapy who recommended skilled nursing facility placement.  She was started on 325 mg daily of aspirin for prophylaxis.  #) Hypothyroid is him: Patient was continued on home levothyroxine.  #) Hypertension/hyper lipidemia: Patient was continued on home pravastatin, amlodipine, lisinopril, metoprolol.   #) Type 2 diabetes: Patient's home metformin was held and she was maintained on sliding scale here.  She may restart her home metformin on discharge.  #) Stage III CKD: This was stable on discharge.  Discharge Diagnoses:  Principal Problem:   Closed fracture of femur, intertrochanteric, right, initial encounter ([place]) Active Problems:   Diabetes ([place])   Hypertension   Hypothyroidism   Hyperlipidemia, mixed   CKD (chronic kidney disease) stage 3, GFR 30-59 ml/min (HCC)   Controlled type 2 diabetes mellitus with hyperglycemia ([place])   Hip fracture [facility]    Discharge Instructions  Discharge Instructions    Call MD for:  difficulty breathing, headache or visual disturbances   Complete by:  As directed    Call MD for:  hives   Complete by:  As directed    Call MD for:  persistant nausea and vomiting   Complete by:  As directed    Call MD for:  redness, tenderness, or signs  of infection (pain, swelling, redness, odor or green/yellow discharge around incision site)   Complete by:  As directed    Call MD for:  severe uncontrolled pain   Complete by:  As directed    Call MD for:  temperature >100.4   Complete by:  As directed    Diet - low sodium heart healthy   Complete by:  As directed    Discharge instructions   Complete by:  As directed    Please follow-up with your primary care doctor in 1 week.   Increase activity slowly   Complete by:  As directed      Allergies as of 08/22/2018      Reactions   Penicillins Itching, Rash      Medication List    STOP taking these medications   aspirin 81 MG chewable tablet Replaced by:  aspirin 325 MG EC tablet     TAKE these medications   amLODipine 10 MG tablet Commonly known as:  NORVASC Take 10 mg by mouth daily.   amLODipine 5 MG tablet Commonly known as:  NORVASC Take 1 tablet (5 mg total) by mouth daily.   aspirin 325 MG EC tablet Take 1 tablet (325 mg total) by mouth daily with breakfast. Start taking on:  08/23/2018 Replaces:  aspirin 81 MG chewable tablet   cholecalciferol 1000 units tablet Commonly known as:  VITAMIN D Take 5,000 Units by mouth daily.   glucose blood test strip Use as instructed   HYDROcodone-acetaminophen 5-325 MG tablet Commonly known as:  NORCO/VICODIN Take 1-2 tablets by mouth every 6 (  six) hours as needed for up to 7 days for moderate pain.   levothyroxine 137 MCG tablet Commonly known as:  SYNTHROID, LEVOTHROID TAKE ONE (1) TABLET EACH DAY What changed:  See the new instructions.   lisinopril 10 MG tablet Commonly known as:  PRINIVIL,ZESTRIL Take 1 tablet (10 mg total) by mouth daily. What changed:  how much to take   metFORMIN 1000 MG tablet Commonly known as:  GLUCOPHAGE Take 1,000 mg by mouth 2 (two) times daily with a meal.   metFORMIN 500 MG 24 hr tablet Commonly known as:  GLUCOPHAGE-XR Take 1 tablet (500 mg total) by mouth 2 (two) times  daily.   metoprolol tartrate 100 MG tablet Commonly known as:  LOPRESSOR Take 1 tablet (100 mg total) by mouth 2 (two) times daily.   naproxen sodium 220 MG tablet Commonly known as:  ALEVE Take 220 mg by mouth 2 (two) times daily as needed (for pain).   onetouch ultrasoft lancets Use as instructed   ONETOUCH VERIO w/Device Kit 1 each by Does not apply route daily.   pravastatin 40 MG tablet Commonly known as:  PRAVACHOL Take 1 tablet (40 mg total) by mouth at bedtime.       Contact information for follow-up providers    Mcarthur Rossetti, MD. Schedule an appointment as soon as possible for a visit in 2 week(s).   Specialty:  Orthopedic Surgery Contact information: [place] [address] [phone]            Contact information for after-discharge care    [facility] .   Service:  Skilled Nursing Contact information: [place] [phone]                 Allergies  Allergen Reactions  . Penicillins Itching and Rash    Consultations:  Orthopedic surgery, Dr. Ninfa Linden   Procedures/Studies: Dg Chest 1 View  Result Date: 08/19/2018 CLINICAL DATA:  [place].  Right hip fracture. EXAM: CHEST  1 VIEW COMPARISON:  02/09/2018 FINDINGS: The heart is enlarged but stable. There is tortuosity and mild ectasia of the thoracic aorta. Prominent mediastinal contours due to the supine position the patient and the AP projection of the film. The lungs are clear. No pneumothorax. Stable eventration right hemidiaphragm. The bony thorax is intact. IMPRESSION: No acute cardiopulmonary findings. Stable cardiac enlargement and prominent mediastinal contours due to the supine position the patient and the AP projection of the film. Electronically Signed   By: Marijo Sanes M.D.   On: 08/19/2018 21:59   Ct Head Wo Contrast  Result Date: 08/19/2018 CLINICAL DATA:   Fall at home. EXAM: CT HEAD WITHOUT CONTRAST CT CERVICAL SPINE WITHOUT CONTRAST TECHNIQUE: Multidetector CT imaging of the head and cervical spine was performed following the standard protocol without intravenous contrast. Multiplanar CT image reconstructions of the cervical spine were also generated. COMPARISON:  None. FINDINGS: CT HEAD FINDINGS Brain: Mild diffuse cortical atrophy is noted. Mild chronic ischemic white matter disease is noted. No mass effect or midline shift is noted. Ventricular size is within normal limits. There is no evidence of mass lesion, hemorrhage or acute infarction. Vascular: No hyperdense vessel or unexpected calcification. Skull: Normal. Negative for fracture or focal lesion. Sinuses/Orbits: No acute finding. Other: Large right posterior scalp hematoma is noted. CT CERVICAL SPINE FINDINGS Alignment: Grade 1 anterolisthesis of C4-5 is noted secondary to posterior facet joint hypertrophy. Skull base and  vertebrae: No acute fracture. No primary bone lesion or focal pathologic process. Soft tissues and spinal canal: No prevertebral fluid or swelling. No visible canal hematoma. Disc levels: Severe degenerative disc disease is noted at C4-5 and C5-6 with anterior osteophyte formation. Upper chest: Negative. Other: Degenerative changes are seen involving the posterior facet joints bilaterally. IMPRESSION: Mild diffuse cortical atrophy. Mild chronic ischemic white matter disease. Large right posterior scalp hematoma. No acute intracranial abnormality seen. Severe multilevel degenerative disc disease. No acute abnormality seen in the cervical spine. Electronically Signed   By: Marijo Conception, M.D.   On: 08/19/2018 21:55   Ct Cervical Spine Wo Contrast  Result Date: 08/19/2018 CLINICAL DATA:  Fall at home. EXAM: CT HEAD WITHOUT CONTRAST CT CERVICAL SPINE WITHOUT CONTRAST TECHNIQUE: Multidetector CT imaging of the head and cervical spine was performed following the standard protocol without  intravenous contrast. Multiplanar CT image reconstructions of the cervical spine were also generated. COMPARISON:  None. FINDINGS: CT HEAD FINDINGS Brain: Mild diffuse cortical atrophy is noted. Mild chronic ischemic white matter disease is noted. No mass effect or midline shift is noted. Ventricular size is within normal limits. There is no evidence of mass lesion, hemorrhage or acute infarction. Vascular: No hyperdense vessel or unexpected calcification. Skull: Normal. Negative for fracture or focal lesion. Sinuses/Orbits: No acute finding. Other: Large right posterior scalp hematoma is noted. CT CERVICAL SPINE FINDINGS Alignment: Grade 1 anterolisthesis of C4-5 is noted secondary to posterior facet joint hypertrophy. Skull base and vertebrae: No acute fracture. No primary bone lesion or focal pathologic process. Soft tissues and spinal canal: No prevertebral fluid or swelling. No visible canal hematoma. Disc levels: Severe degenerative disc disease is noted at C4-5 and C5-6 with anterior osteophyte formation. Upper chest: Negative. Other: Degenerative changes are seen involving the posterior facet joints bilaterally. IMPRESSION: Mild diffuse cortical atrophy. Mild chronic ischemic white matter disease. Large right posterior scalp hematoma. No acute intracranial abnormality seen. Severe multilevel degenerative disc disease. No acute abnormality seen in the cervical spine. Electronically Signed   By: Marijo Conception, M.D.   On: 08/19/2018 21:55   Dg C-arm 1-60 Min  Result Date: 08/20/2018 CLINICAL DATA:  Patient status post ORIF proximal right femur fracture. EXAM: RIGHT FEMUR 2 VIEWS; DG C-ARM 61-120 MIN COMPARISON:  Right femur radiograph 08/19/2018 FINDINGS: Patient status post intramedullary rod fixation of right intertrochanteric femur fracture. Improved anatomic alignment. Hardware appears in appropriate position. IMPRESSION: Patient status post ORIF proximal right femur fracture. Electronically Signed    By: Lovey Newcomer M.D.   On: 08/20/2018 14:17   Dg Hip Unilat W Or Wo Pelvis 2-3 Views Right  Result Date: 08/19/2018 CLINICAL DATA:  [place].  Right hip pain. EXAM: DG HIP (WITH OR WITHOUT PELVIS) 2-3V RIGHT COMPARISON:  06/22/2018 FINDINGS: Stable surgical changes from left hip fracture fixation with a gamma nail and compression screw. Progressive healing changes. Advanced left hip joint degenerative changes. There is a displaced intertrochanteric fracture of the right hip with a marked varus deformity. The pubic symphysis and SI joints are intact. No pelvic fractures. IMPRESSION: New/acute displaced intertrochanteric fracture of the right hip. Remote healed/healing left hip fracture. No definite pelvic fractures. Electronically Signed   By: Marijo Sanes M.D.   On: 08/19/2018 21:58   Dg Femur, Min 2 Views Right  Result Date: 08/20/2018 CLINICAL DATA:  Patient status post ORIF proximal right femur fracture. EXAM: RIGHT FEMUR 2 VIEWS; DG C-ARM 61-120 MIN COMPARISON:  Right femur radiograph 08/19/2018  FINDINGS: Patient status post intramedullary rod fixation of right intertrochanteric femur fracture. Improved anatomic alignment. Hardware appears in appropriate position. IMPRESSION: Patient status post ORIF proximal right femur fracture. Electronically Signed   By: Lovey Newcomer M.D.   On: 08/20/2018 14:17     ORIF of right intertrochanteric hip fracture on 08/20/2018   Subjective:   Discharge Exam: Vitals:   08/22/18 0854 08/22/18 1312  BP: 138/72 (!) 149/91  Pulse: 70 68  Resp:    Temp: 97.7 F (36.5 C) 98.1 F (36.7 C)  SpO2: 91% 98%   Vitals:   08/22/18 0043 08/22/18 0425 08/22/18 0854 08/22/18 1312  BP: 125/69 (!) 161/77 138/72 (!) 149/91  Pulse: 60 66 70 68  Resp: 20 14    Temp: (!) 97.5 F (36.4 C) 97.8 F (36.6 C) 97.7 F (36.5 C) 98.1 F (36.7 C)  TempSrc: Oral Oral Oral Oral  SpO2: 95% 92% 91% 98%  Weight:      Height:        General exam: Appears calm and comfortable   Respiratory system: Clear to auscultation. Respiratory effort normal. Cardiovascular system: Regular rate and rhythm, no murmurs Gastrointestinal system: Soft, nondistended, no rebound or guarding, plus bowel sounds Central nervous system: Alert and oriented.  Grossly intact, moving all extremities Extremities: No lower extremity edema, right lower extremity limited by pain but intact distal pulses Skin: Incision site is clean dry and intact Psychiatry: Judgement and insight appear normal. Mood & affect appropriate.    The results of significant diagnostics from this hospitalization (including imaging, microbiology, ancillary and laboratory) are listed below for reference.     Microbiology: Recent Results (from the past 240 hour(s))  Surgical pcr screen     Status: None   Collection Time: 08/20/18  9:13 AM  Result Value Ref Range Status   MRSA, PCR NEGATIVE NEGATIVE Final   Staphylococcus aureus NEGATIVE NEGATIVE Final    Comment: (NOTE) The Xpert SA Assay (FDA approved for NASAL specimens in patients 33 years of age and older), is one component of a comprehensive surveillance program. It is not intended to diagnose infection nor to guide or monitor treatment. Performed at [facility], [place] [address], [place], [place] [address]      Labs: BNP (last 3 results) No results for input(s): BNP in the last 8760 hours. Basic Metabolic Panel: Recent Labs  Lab 08/19/18 2105 08/20/18 0348 08/21/18 0302 08/22/18 0223  NA 139 138 138 138  K 3.7 4.0 3.5 3.4*  CL 105 107 106 106  CO2 21* 21* 23 24  GLUCOSE 365* 406* 201* 177*  BUN 25* _0 CREATININE 1.30* 1.25* 1.20* 1.39*  CALCIUM 9.9 9.4 9.0 8.6*   Liver Function Tests: Recent Labs  Lab 08/19/18 2105 08/20/18 0348  AST 21  --   ALT 19  --   ALKPHOS 81  --   BILITOT 0.3  --   PROT 7.2  --   ALBUMIN 3.8 3.1*   No results for input(s): LIPASE, AMYLASE in the last 168 hours. No results for input(s): AMMONIA  in the last 168 hours. CBC: Recent Labs  Lab 08/19/18 2105 08/20/18 0348 08/21/18 0302 08/22/18 0223  WBC 13.4* 11.6* 10.8* 10.0  NEUTROABS 11.1*  --   --   --   HGB 13.8 11.8* 10.3* 9.6*  HCT 41.9 35.0* 30.1* 28.4*  MCV 87.1 85.4 85.3 87.1  PLT 236 209 184 164   Cardiac Enzymes: No results for input(s):  CKTOTAL, CKMB, CKMBINDEX, TROPONINI in the last 168 hours. BNP: Invalid input(s): POCBNP CBG: Recent Labs  Lab 08/21/18 1951 08/22/18 0040 08/22/18 0422 08/22/18 0830 08/22/18 1132  GLUCAP 222* 182* 161* 166* 141*   D-Dimer No results for input(s): DDIMER in the last 72 hours. Hgb A1c Recent Labs    08/20/18 0348  HGBA1C 8.7*   Lipid Profile No results for input(s): CHOL, HDL, LDLCALC, TRIG, CHOLHDL, LDLDIRECT in the last 72 hours. Thyroid function studies Recent Labs    08/21/18 1823  TSH 1.295   Anemia work up No results for input(s): VITAMINB12, FOLATE, FERRITIN, TIBC, IRON, RETICCTPCT in the last 72 hours. Urinalysis    Component Value Date/Time   COLORURINE YELLOW 02/13/2018 1335   APPEARANCEUR HAZY (A) 02/13/2018 1335   APPEARANCEUR Clear 12/31/2017 0829   LABSPEC 1.018 02/13/2018 1335   PHURINE 5.0 02/13/2018 1335   GLUCOSEU NEGATIVE 02/13/2018 1335   HGBUR LARGE (A) 02/13/2018 1335   BILIRUBINUR NEGATIVE 02/13/2018 1335   BILIRUBINUR Negative 12/31/2017 0829   KETONESUR NEGATIVE 02/13/2018 1335   PROTEINUR 30 (A) 02/13/2018 1335   UROBILINOGEN 0.2 04/02/2014 1424   NITRITE NEGATIVE 02/13/2018 1335   LEUKOCYTESUR MODERATE (A) 02/13/2018 1335   LEUKOCYTESUR 2+ (A) 12/31/2017 0829   Sepsis Labs Invalid input(s): PROCALCITONIN,  WBC,  LACTICIDVEN Microbiology Recent Results (from the past 240 hour(s))  Surgical pcr screen     Status: None   Collection Time: 08/20/18  9:13 AM  Result Value Ref Range Status   MRSA, PCR NEGATIVE NEGATIVE Final   Staphylococcus aureus NEGATIVE NEGATIVE Final    Comment: (NOTE) The Xpert SA Assay (FDA  approved for NASAL specimens in patients 54 years of age and older), is one component of a comprehensive surveillance program. It is not intended to diagnose infection nor to guide or monitor treatment. Performed at [facility], [place] [address], [place], [place] [address]      Time coordinating discharge: 23  SIGNED:   Cristy Folks, MD  [facility] 08/22/2018, 3:57 PM   If 7PM-7AM, please contact night-coverage [email or web address] Password [email or web address]

## 2018-08-22 NOTE — Progress Notes (Signed)
This RN spoke with Pt's daughter about [facility] transportation. [facility] is at the bedside and Pt will be in route to SNF. Family informed on Pt's destination. [facility]. Family ok with time of discharge. No further concerns.

## 2018-08-23 DIAGNOSIS — M8000XD Age-related osteoporosis with current pathological fracture, unspecified site, subsequent encounter for fracture with routine healing: Secondary | ICD-10-CM | POA: Diagnosis not present

## 2018-08-23 DIAGNOSIS — E118 Type 2 diabetes mellitus with unspecified complications: Secondary | ICD-10-CM | POA: Diagnosis not present

## 2018-08-23 DIAGNOSIS — I10 Essential (primary) hypertension: Secondary | ICD-10-CM | POA: Diagnosis not present

## 2018-08-28 DIAGNOSIS — Z8781 Personal history of (healed) traumatic fracture: Secondary | ICD-10-CM | POA: Diagnosis not present

## 2018-08-28 DIAGNOSIS — I10 Essential (primary) hypertension: Secondary | ICD-10-CM | POA: Diagnosis not present

## 2018-08-28 DIAGNOSIS — M25551 Pain in right hip: Secondary | ICD-10-CM | POA: Diagnosis not present

## 2018-08-28 DIAGNOSIS — K5900 Constipation, unspecified: Secondary | ICD-10-CM | POA: Diagnosis not present

## 2018-09-04 ENCOUNTER — Ambulatory Visit (INDEPENDENT_AMBULATORY_CARE_PROVIDER_SITE_OTHER): Payer: Medicare Other | Admitting: Physician Assistant

## 2018-09-04 ENCOUNTER — Ambulatory Visit (INDEPENDENT_AMBULATORY_CARE_PROVIDER_SITE_OTHER): Payer: Medicare Other

## 2018-09-04 ENCOUNTER — Encounter (INDEPENDENT_AMBULATORY_CARE_PROVIDER_SITE_OTHER): Payer: Self-pay | Admitting: Physician Assistant

## 2018-09-04 DIAGNOSIS — M25551 Pain in right hip: Secondary | ICD-10-CM

## 2018-09-04 NOTE — Progress Notes (Signed)
HPI: [name] returns today 2 weeks status post open reduction internal fixation of a right hip intertrochanteric fracture.  She is at a skilled facility at this point.  She still have moderate pain.  She had no fevers chills.  Physical exam: General well-developed well-nourished female no acute distress mood and affect appropriate.  She is seated in wheelchair. Right lower extremity calf supple nontender.  Able to dorsiflex plantarflex her ankle.  She has discomfort with any attempted internal and external rotation of the hip the hip moves as a unit.  Surgical incisions are well approximated with staples no signs of infection.  Radiographs: Right hip 2 views show overall good position alignment status post IM nailing with compression screw.  No hardware failure distal portion of the IM nail is not visualized.  No other fractures identified.  Impression 2 weeks status post open reduction internal fixation of right intertrochanteric fracture  Plan: Staples removed Steri-Strips applied.  She is able to get the incision wet.  She is weightbearing as tolerated on the right hip.  She will follow-up with us in a month AP and lateral views of the right femur at that time.  Questions were encouraged and answered.

## 2018-09-11 DIAGNOSIS — M25551 Pain in right hip: Secondary | ICD-10-CM | POA: Diagnosis not present

## 2018-09-11 DIAGNOSIS — K5900 Constipation, unspecified: Secondary | ICD-10-CM | POA: Diagnosis not present

## 2018-09-11 DIAGNOSIS — I10 Essential (primary) hypertension: Secondary | ICD-10-CM | POA: Diagnosis not present

## 2018-09-11 DIAGNOSIS — Z8781 Personal history of (healed) traumatic fracture: Secondary | ICD-10-CM | POA: Diagnosis not present

## 2018-09-14 DIAGNOSIS — I129 Hypertensive chronic kidney disease with stage 1 through stage 4 chronic kidney disease, or unspecified chronic kidney disease: Secondary | ICD-10-CM | POA: Diagnosis not present

## 2018-09-14 DIAGNOSIS — Z7984 Long term (current) use of oral hypoglycemic drugs: Secondary | ICD-10-CM | POA: Diagnosis not present

## 2018-09-14 DIAGNOSIS — S72141D Displaced intertrochanteric fracture of right femur, subsequent encounter for closed fracture with routine healing: Secondary | ICD-10-CM | POA: Diagnosis not present

## 2018-09-14 DIAGNOSIS — E782 Mixed hyperlipidemia: Secondary | ICD-10-CM | POA: Diagnosis not present

## 2018-09-14 DIAGNOSIS — N183 Chronic kidney disease, stage 3 (moderate): Secondary | ICD-10-CM | POA: Diagnosis not present

## 2018-09-14 DIAGNOSIS — M810 Age-related osteoporosis without current pathological fracture: Secondary | ICD-10-CM | POA: Diagnosis not present

## 2018-09-14 DIAGNOSIS — Z9181 History of falling: Secondary | ICD-10-CM | POA: Diagnosis not present

## 2018-09-14 DIAGNOSIS — E559 Vitamin D deficiency, unspecified: Secondary | ICD-10-CM | POA: Diagnosis not present

## 2018-09-14 DIAGNOSIS — M1990 Unspecified osteoarthritis, unspecified site: Secondary | ICD-10-CM | POA: Diagnosis not present

## 2018-09-14 DIAGNOSIS — E039 Hypothyroidism, unspecified: Secondary | ICD-10-CM | POA: Diagnosis not present

## 2018-09-14 DIAGNOSIS — E1122 Type 2 diabetes mellitus with diabetic chronic kidney disease: Secondary | ICD-10-CM | POA: Diagnosis not present

## 2018-09-14 DIAGNOSIS — K5900 Constipation, unspecified: Secondary | ICD-10-CM | POA: Diagnosis not present

## 2018-09-15 ENCOUNTER — Telehealth: Payer: Self-pay | Admitting: *Deleted

## 2018-09-15 NOTE — Telephone Encounter (Signed)
Discharged from SNF on 09/13/18. Left message for patient to return my call to discuss transitional care management and to schedule an appointment for a follow up.

## 2018-09-19 DIAGNOSIS — Z7984 Long term (current) use of oral hypoglycemic drugs: Secondary | ICD-10-CM | POA: Diagnosis not present

## 2018-09-19 DIAGNOSIS — E782 Mixed hyperlipidemia: Secondary | ICD-10-CM | POA: Diagnosis not present

## 2018-09-19 DIAGNOSIS — M1990 Unspecified osteoarthritis, unspecified site: Secondary | ICD-10-CM | POA: Diagnosis not present

## 2018-09-19 DIAGNOSIS — K5900 Constipation, unspecified: Secondary | ICD-10-CM | POA: Diagnosis not present

## 2018-09-19 DIAGNOSIS — Z9181 History of falling: Secondary | ICD-10-CM | POA: Diagnosis not present

## 2018-09-19 DIAGNOSIS — S72141D Displaced intertrochanteric fracture of right femur, subsequent encounter for closed fracture with routine healing: Secondary | ICD-10-CM | POA: Diagnosis not present

## 2018-09-19 DIAGNOSIS — M810 Age-related osteoporosis without current pathological fracture: Secondary | ICD-10-CM | POA: Diagnosis not present

## 2018-09-19 DIAGNOSIS — I129 Hypertensive chronic kidney disease with stage 1 through stage 4 chronic kidney disease, or unspecified chronic kidney disease: Secondary | ICD-10-CM | POA: Diagnosis not present

## 2018-09-19 DIAGNOSIS — E039 Hypothyroidism, unspecified: Secondary | ICD-10-CM | POA: Diagnosis not present

## 2018-09-19 DIAGNOSIS — E1122 Type 2 diabetes mellitus with diabetic chronic kidney disease: Secondary | ICD-10-CM | POA: Diagnosis not present

## 2018-09-19 DIAGNOSIS — E559 Vitamin D deficiency, unspecified: Secondary | ICD-10-CM | POA: Diagnosis not present

## 2018-09-19 DIAGNOSIS — N183 Chronic kidney disease, stage 3 (moderate): Secondary | ICD-10-CM | POA: Diagnosis not present

## 2018-09-20 NOTE — Telephone Encounter (Signed)
I spoke with [name] by phone. She states that she is feeling better and that her ortho, Dr Carlis Abbott, is weaning her off of the pain medication. She is using a walker to ambulate at home and still has a home health nurse and PT coming regularly. She lives at home with her husband and he is able to help with ADLs. She has not had any falls since being discharged. She has clear path throughout her home. She also has a raised toilet seat and handles in the shower. States that she has not been able to get in the shower though because of a blister on the back of her heal. Home Health is monitoring that. She is scheduled for a f/u visit with Dr Carlis Abbott on 10/02/18 and has an existing appointment with Dr Evette Doffing in January. She does not wish to schedule a sooner appointment with Dr Evette Doffing at this time but will call back if the need arises.   DISCHARGE INFORMATION Date of Discharge:09/13/18  Discharge Facility: SNF  Principal Discharge Diagnosis: hip fracture  Patient and/or caregiver is knowledgeable of his/her condition(s) and treatment: Yes  MEDICATION RECONCILIATION Current medication list reviewed with patient:Yes  Outpatient Encounter Medications as of 09/15/2018  Medication Sig  . amLODipine (NORVASC) 10 MG tablet Take 10 mg by mouth daily.  Marland Kitchen amLODipine (NORVASC) 5 MG tablet Take 1 tablet (5 mg total) by mouth daily. (Patient not taking: Reported on 08/19/2018)  . aspirin EC 325 MG EC tablet Take 1 tablet (325 mg total) by mouth daily with breakfast.  . Blood Glucose Monitoring Suppl (ONETOUCH VERIO) w/Device KIT 1 each by Does not apply route daily.  . cholecalciferol (VITAMIN D) 1000 units tablet Take 5,000 Units by mouth daily.   Marland Kitchen glucose blood (ONETOUCH VERIO) test strip Use as instructed  . Lancets (ONETOUCH ULTRASOFT) lancets Use as instructed  . levothyroxine (SYNTHROID, LEVOTHROID) 137 MCG tablet TAKE ONE (1) TABLET EACH DAY (Patient taking differently: Take 137 mcg by mouth daily before  breakfast. )  . lisinopril (PRINIVIL,ZESTRIL) 10 MG tablet Take 1 tablet (10 mg total) by mouth daily. (Patient taking differently: Take 20 mg by mouth daily. )  . metFORMIN (GLUCOPHAGE) 1000 MG tablet Take 1,000 mg by mouth 2 (two) times daily with a meal.  . metFORMIN (GLUCOPHAGE-XR) 500 MG 24 hr tablet Take 1 tablet (500 mg total) by mouth 2 (two) times daily. (Patient not taking: Reported on 08/19/2018)  . metoprolol tartrate (LOPRESSOR) 100 MG tablet Take 1 tablet (100 mg total) by mouth 2 (two) times daily.  . naproxen sodium (ALEVE) 220 MG tablet Take 220 mg by mouth 2 (two) times daily as needed (for pain).  . pravastatin (PRAVACHOL) 40 MG tablet Take 1 tablet (40 mg total) by mouth at bedtime.   No facility-administered encounter medications on file as of 09/15/2018.     Discharge Medications reviewed and reconciled with current medications.yes  Patient is able to obtain needed medications:Yes  ACTIVITIES OF DAILY LIVING  Is the patient able to perform his/her own ADLs: Yes.  with help from husband  Patient is receiving home health services: Yes.  Nursing and Physical Therapy  PATIENT EDUCATION Questions/Concerns Discussed: No questions or concerns at this time. Following up regularly with orthopedic.    Chong Sicilian, RN-BC, BSN Nurse Case Manager [facility] Ph: [phone]

## 2018-09-21 ENCOUNTER — Telehealth: Payer: Self-pay | Admitting: Pediatrics

## 2018-09-21 NOTE — Telephone Encounter (Signed)
FYI  A [employer] NP called states that did their once a year home visit and did a quantaflo study that showed moderate PAD, they will being sending over the report shortly for the dr review.

## 2018-09-21 NOTE — Telephone Encounter (Signed)
FYI

## 2018-09-22 DIAGNOSIS — M810 Age-related osteoporosis without current pathological fracture: Secondary | ICD-10-CM | POA: Diagnosis not present

## 2018-09-22 DIAGNOSIS — Z7984 Long term (current) use of oral hypoglycemic drugs: Secondary | ICD-10-CM | POA: Diagnosis not present

## 2018-09-22 DIAGNOSIS — S72141D Displaced intertrochanteric fracture of right femur, subsequent encounter for closed fracture with routine healing: Secondary | ICD-10-CM | POA: Diagnosis not present

## 2018-09-22 DIAGNOSIS — M1990 Unspecified osteoarthritis, unspecified site: Secondary | ICD-10-CM | POA: Diagnosis not present

## 2018-09-22 DIAGNOSIS — E039 Hypothyroidism, unspecified: Secondary | ICD-10-CM | POA: Diagnosis not present

## 2018-09-22 DIAGNOSIS — E782 Mixed hyperlipidemia: Secondary | ICD-10-CM | POA: Diagnosis not present

## 2018-09-22 DIAGNOSIS — Z9181 History of falling: Secondary | ICD-10-CM | POA: Diagnosis not present

## 2018-09-22 DIAGNOSIS — K5900 Constipation, unspecified: Secondary | ICD-10-CM | POA: Diagnosis not present

## 2018-09-22 DIAGNOSIS — E559 Vitamin D deficiency, unspecified: Secondary | ICD-10-CM | POA: Diagnosis not present

## 2018-09-22 DIAGNOSIS — E1122 Type 2 diabetes mellitus with diabetic chronic kidney disease: Secondary | ICD-10-CM | POA: Diagnosis not present

## 2018-09-22 DIAGNOSIS — N183 Chronic kidney disease, stage 3 (moderate): Secondary | ICD-10-CM | POA: Diagnosis not present

## 2018-09-22 DIAGNOSIS — I129 Hypertensive chronic kidney disease with stage 1 through stage 4 chronic kidney disease, or unspecified chronic kidney disease: Secondary | ICD-10-CM | POA: Diagnosis not present

## 2018-09-25 DIAGNOSIS — I129 Hypertensive chronic kidney disease with stage 1 through stage 4 chronic kidney disease, or unspecified chronic kidney disease: Secondary | ICD-10-CM | POA: Diagnosis not present

## 2018-09-25 DIAGNOSIS — E039 Hypothyroidism, unspecified: Secondary | ICD-10-CM | POA: Diagnosis not present

## 2018-09-25 DIAGNOSIS — M1990 Unspecified osteoarthritis, unspecified site: Secondary | ICD-10-CM | POA: Diagnosis not present

## 2018-09-25 DIAGNOSIS — E559 Vitamin D deficiency, unspecified: Secondary | ICD-10-CM | POA: Diagnosis not present

## 2018-09-25 DIAGNOSIS — Z7984 Long term (current) use of oral hypoglycemic drugs: Secondary | ICD-10-CM | POA: Diagnosis not present

## 2018-09-25 DIAGNOSIS — Z9181 History of falling: Secondary | ICD-10-CM | POA: Diagnosis not present

## 2018-09-25 DIAGNOSIS — N183 Chronic kidney disease, stage 3 (moderate): Secondary | ICD-10-CM | POA: Diagnosis not present

## 2018-09-25 DIAGNOSIS — E1122 Type 2 diabetes mellitus with diabetic chronic kidney disease: Secondary | ICD-10-CM | POA: Diagnosis not present

## 2018-09-25 DIAGNOSIS — M810 Age-related osteoporosis without current pathological fracture: Secondary | ICD-10-CM | POA: Diagnosis not present

## 2018-09-25 DIAGNOSIS — E782 Mixed hyperlipidemia: Secondary | ICD-10-CM | POA: Diagnosis not present

## 2018-09-25 DIAGNOSIS — K5900 Constipation, unspecified: Secondary | ICD-10-CM | POA: Diagnosis not present

## 2018-09-25 DIAGNOSIS — S72141D Displaced intertrochanteric fracture of right femur, subsequent encounter for closed fracture with routine healing: Secondary | ICD-10-CM | POA: Diagnosis not present

## 2018-09-26 ENCOUNTER — Ambulatory Visit (INDEPENDENT_AMBULATORY_CARE_PROVIDER_SITE_OTHER): Payer: Medicare Other

## 2018-09-26 DIAGNOSIS — I129 Hypertensive chronic kidney disease with stage 1 through stage 4 chronic kidney disease, or unspecified chronic kidney disease: Secondary | ICD-10-CM | POA: Diagnosis not present

## 2018-09-26 DIAGNOSIS — M810 Age-related osteoporosis without current pathological fracture: Secondary | ICD-10-CM

## 2018-09-26 DIAGNOSIS — K5900 Constipation, unspecified: Secondary | ICD-10-CM

## 2018-09-26 DIAGNOSIS — E1122 Type 2 diabetes mellitus with diabetic chronic kidney disease: Secondary | ICD-10-CM | POA: Diagnosis not present

## 2018-09-26 DIAGNOSIS — N183 Chronic kidney disease, stage 3 (moderate): Secondary | ICD-10-CM | POA: Diagnosis not present

## 2018-09-26 DIAGNOSIS — E559 Vitamin D deficiency, unspecified: Secondary | ICD-10-CM

## 2018-09-26 DIAGNOSIS — S72141D Displaced intertrochanteric fracture of right femur, subsequent encounter for closed fracture with routine healing: Secondary | ICD-10-CM

## 2018-09-26 DIAGNOSIS — Z9181 History of falling: Secondary | ICD-10-CM

## 2018-09-26 DIAGNOSIS — E782 Mixed hyperlipidemia: Secondary | ICD-10-CM

## 2018-09-26 DIAGNOSIS — M1990 Unspecified osteoarthritis, unspecified site: Secondary | ICD-10-CM

## 2018-09-26 DIAGNOSIS — Z7984 Long term (current) use of oral hypoglycemic drugs: Secondary | ICD-10-CM

## 2018-09-26 DIAGNOSIS — E039 Hypothyroidism, unspecified: Secondary | ICD-10-CM

## 2018-09-27 DIAGNOSIS — S72141D Displaced intertrochanteric fracture of right femur, subsequent encounter for closed fracture with routine healing: Secondary | ICD-10-CM | POA: Diagnosis not present

## 2018-09-27 DIAGNOSIS — Z9181 History of falling: Secondary | ICD-10-CM | POA: Diagnosis not present

## 2018-09-27 DIAGNOSIS — Z7984 Long term (current) use of oral hypoglycemic drugs: Secondary | ICD-10-CM | POA: Diagnosis not present

## 2018-09-27 DIAGNOSIS — E782 Mixed hyperlipidemia: Secondary | ICD-10-CM | POA: Diagnosis not present

## 2018-09-27 DIAGNOSIS — E039 Hypothyroidism, unspecified: Secondary | ICD-10-CM | POA: Diagnosis not present

## 2018-09-27 DIAGNOSIS — I129 Hypertensive chronic kidney disease with stage 1 through stage 4 chronic kidney disease, or unspecified chronic kidney disease: Secondary | ICD-10-CM | POA: Diagnosis not present

## 2018-09-27 DIAGNOSIS — E559 Vitamin D deficiency, unspecified: Secondary | ICD-10-CM | POA: Diagnosis not present

## 2018-09-27 DIAGNOSIS — K5900 Constipation, unspecified: Secondary | ICD-10-CM | POA: Diagnosis not present

## 2018-09-27 DIAGNOSIS — N183 Chronic kidney disease, stage 3 (moderate): Secondary | ICD-10-CM | POA: Diagnosis not present

## 2018-09-27 DIAGNOSIS — E1122 Type 2 diabetes mellitus with diabetic chronic kidney disease: Secondary | ICD-10-CM | POA: Diagnosis not present

## 2018-09-27 DIAGNOSIS — M810 Age-related osteoporosis without current pathological fracture: Secondary | ICD-10-CM | POA: Diagnosis not present

## 2018-09-27 DIAGNOSIS — M1990 Unspecified osteoarthritis, unspecified site: Secondary | ICD-10-CM | POA: Diagnosis not present

## 2018-10-02 ENCOUNTER — Ambulatory Visit (INDEPENDENT_AMBULATORY_CARE_PROVIDER_SITE_OTHER): Payer: Medicare Other | Admitting: Physician Assistant

## 2018-10-02 ENCOUNTER — Ambulatory Visit (INDEPENDENT_AMBULATORY_CARE_PROVIDER_SITE_OTHER): Payer: Medicare Other

## 2018-10-02 ENCOUNTER — Encounter (INDEPENDENT_AMBULATORY_CARE_PROVIDER_SITE_OTHER): Payer: Self-pay | Admitting: Physician Assistant

## 2018-10-02 DIAGNOSIS — M25551 Pain in right hip: Secondary | ICD-10-CM | POA: Diagnosis not present

## 2018-10-02 DIAGNOSIS — S72141A Displaced intertrochanteric fracture of right femur, initial encounter for closed fracture: Secondary | ICD-10-CM

## 2018-10-02 MED ORDER — HYDROCODONE-ACETAMINOPHEN 5-325 MG PO TABS: 1.0000 | ORAL_TABLET | ORAL | 0 refills | Status: AC | PRN

## 2018-10-02 NOTE — Progress Notes (Signed)
HPI: [name] returns today 6 weeks status post ORIF of a right hip intertrochanteric fracture.  She is overall trending towards improvement.  Said no fevers chills shortness of breath chest pain.  States incisions of healed well.  She is ambulating with a walker.  She did have a fall in her home when she tripped over a rug but states she does not feel like she hurt anything.  She did have to have her son help her get up.  Physical exam: General well-developed well-nourished female no acute distress mood and affect appropriate Right hip: Good range of motion with internal and external rotation without pain.  Calf supple with minimal discomfort.  She ambulates with a walker and is able to get up and down out of the chair on her own.  Radiographs: AP pelvis and right hip show interval healing. No hardware failure. No acute fracture.   Plan: She will continue to work on range of motion strengthening right hip.  We will see her back in a month obtain AP and lateral views of the right femur at that time.  Questions are encouraged and answered.

## 2018-10-09 ENCOUNTER — Ambulatory Visit: Payer: Medicare Other

## 2018-10-09 DIAGNOSIS — M1990 Unspecified osteoarthritis, unspecified site: Secondary | ICD-10-CM | POA: Diagnosis not present

## 2018-10-09 DIAGNOSIS — Z7984 Long term (current) use of oral hypoglycemic drugs: Secondary | ICD-10-CM | POA: Diagnosis not present

## 2018-10-09 DIAGNOSIS — Z9181 History of falling: Secondary | ICD-10-CM | POA: Diagnosis not present

## 2018-10-09 DIAGNOSIS — M810 Age-related osteoporosis without current pathological fracture: Secondary | ICD-10-CM | POA: Diagnosis not present

## 2018-10-09 DIAGNOSIS — I129 Hypertensive chronic kidney disease with stage 1 through stage 4 chronic kidney disease, or unspecified chronic kidney disease: Secondary | ICD-10-CM | POA: Diagnosis not present

## 2018-10-09 DIAGNOSIS — E039 Hypothyroidism, unspecified: Secondary | ICD-10-CM | POA: Diagnosis not present

## 2018-10-09 DIAGNOSIS — E559 Vitamin D deficiency, unspecified: Secondary | ICD-10-CM | POA: Diagnosis not present

## 2018-10-09 DIAGNOSIS — E782 Mixed hyperlipidemia: Secondary | ICD-10-CM | POA: Diagnosis not present

## 2018-10-09 DIAGNOSIS — K5900 Constipation, unspecified: Secondary | ICD-10-CM | POA: Diagnosis not present

## 2018-10-09 DIAGNOSIS — E1122 Type 2 diabetes mellitus with diabetic chronic kidney disease: Secondary | ICD-10-CM | POA: Diagnosis not present

## 2018-10-09 DIAGNOSIS — S72141D Displaced intertrochanteric fracture of right femur, subsequent encounter for closed fracture with routine healing: Secondary | ICD-10-CM | POA: Diagnosis not present

## 2018-10-09 DIAGNOSIS — N183 Chronic kidney disease, stage 3 (moderate): Secondary | ICD-10-CM | POA: Diagnosis not present

## 2018-10-17 DIAGNOSIS — M810 Age-related osteoporosis without current pathological fracture: Secondary | ICD-10-CM | POA: Diagnosis not present

## 2018-10-17 DIAGNOSIS — E039 Hypothyroidism, unspecified: Secondary | ICD-10-CM | POA: Diagnosis not present

## 2018-10-17 DIAGNOSIS — I129 Hypertensive chronic kidney disease with stage 1 through stage 4 chronic kidney disease, or unspecified chronic kidney disease: Secondary | ICD-10-CM | POA: Diagnosis not present

## 2018-10-17 DIAGNOSIS — N183 Chronic kidney disease, stage 3 (moderate): Secondary | ICD-10-CM | POA: Diagnosis not present

## 2018-10-17 DIAGNOSIS — M1990 Unspecified osteoarthritis, unspecified site: Secondary | ICD-10-CM | POA: Diagnosis not present

## 2018-10-17 DIAGNOSIS — S72141D Displaced intertrochanteric fracture of right femur, subsequent encounter for closed fracture with routine healing: Secondary | ICD-10-CM | POA: Diagnosis not present

## 2018-10-17 DIAGNOSIS — Z7984 Long term (current) use of oral hypoglycemic drugs: Secondary | ICD-10-CM | POA: Diagnosis not present

## 2018-10-17 DIAGNOSIS — E782 Mixed hyperlipidemia: Secondary | ICD-10-CM | POA: Diagnosis not present

## 2018-10-17 DIAGNOSIS — E1122 Type 2 diabetes mellitus with diabetic chronic kidney disease: Secondary | ICD-10-CM | POA: Diagnosis not present

## 2018-10-17 DIAGNOSIS — K5900 Constipation, unspecified: Secondary | ICD-10-CM | POA: Diagnosis not present

## 2018-10-17 DIAGNOSIS — E559 Vitamin D deficiency, unspecified: Secondary | ICD-10-CM | POA: Diagnosis not present

## 2018-10-17 DIAGNOSIS — Z9181 History of falling: Secondary | ICD-10-CM | POA: Diagnosis not present

## 2018-10-18 DIAGNOSIS — I129 Hypertensive chronic kidney disease with stage 1 through stage 4 chronic kidney disease, or unspecified chronic kidney disease: Secondary | ICD-10-CM | POA: Diagnosis not present

## 2018-10-18 DIAGNOSIS — M810 Age-related osteoporosis without current pathological fracture: Secondary | ICD-10-CM | POA: Diagnosis not present

## 2018-10-18 DIAGNOSIS — E039 Hypothyroidism, unspecified: Secondary | ICD-10-CM | POA: Diagnosis not present

## 2018-10-18 DIAGNOSIS — Z9181 History of falling: Secondary | ICD-10-CM | POA: Diagnosis not present

## 2018-10-18 DIAGNOSIS — E559 Vitamin D deficiency, unspecified: Secondary | ICD-10-CM | POA: Diagnosis not present

## 2018-10-18 DIAGNOSIS — Z7984 Long term (current) use of oral hypoglycemic drugs: Secondary | ICD-10-CM | POA: Diagnosis not present

## 2018-10-18 DIAGNOSIS — M1990 Unspecified osteoarthritis, unspecified site: Secondary | ICD-10-CM | POA: Diagnosis not present

## 2018-10-18 DIAGNOSIS — E1122 Type 2 diabetes mellitus with diabetic chronic kidney disease: Secondary | ICD-10-CM | POA: Diagnosis not present

## 2018-10-18 DIAGNOSIS — S72141D Displaced intertrochanteric fracture of right femur, subsequent encounter for closed fracture with routine healing: Secondary | ICD-10-CM | POA: Diagnosis not present

## 2018-10-18 DIAGNOSIS — E782 Mixed hyperlipidemia: Secondary | ICD-10-CM | POA: Diagnosis not present

## 2018-10-18 DIAGNOSIS — K5900 Constipation, unspecified: Secondary | ICD-10-CM | POA: Diagnosis not present

## 2018-10-18 DIAGNOSIS — N183 Chronic kidney disease, stage 3 (moderate): Secondary | ICD-10-CM | POA: Diagnosis not present

## 2018-10-23 DIAGNOSIS — Z9181 History of falling: Secondary | ICD-10-CM | POA: Diagnosis not present

## 2018-10-23 DIAGNOSIS — E782 Mixed hyperlipidemia: Secondary | ICD-10-CM | POA: Diagnosis not present

## 2018-10-23 DIAGNOSIS — K5900 Constipation, unspecified: Secondary | ICD-10-CM | POA: Diagnosis not present

## 2018-10-23 DIAGNOSIS — E559 Vitamin D deficiency, unspecified: Secondary | ICD-10-CM | POA: Diagnosis not present

## 2018-10-23 DIAGNOSIS — M1990 Unspecified osteoarthritis, unspecified site: Secondary | ICD-10-CM | POA: Diagnosis not present

## 2018-10-23 DIAGNOSIS — N183 Chronic kidney disease, stage 3 (moderate): Secondary | ICD-10-CM | POA: Diagnosis not present

## 2018-10-23 DIAGNOSIS — S72141D Displaced intertrochanteric fracture of right femur, subsequent encounter for closed fracture with routine healing: Secondary | ICD-10-CM | POA: Diagnosis not present

## 2018-10-23 DIAGNOSIS — M810 Age-related osteoporosis without current pathological fracture: Secondary | ICD-10-CM | POA: Diagnosis not present

## 2018-10-23 DIAGNOSIS — Z7984 Long term (current) use of oral hypoglycemic drugs: Secondary | ICD-10-CM | POA: Diagnosis not present

## 2018-10-23 DIAGNOSIS — E1122 Type 2 diabetes mellitus with diabetic chronic kidney disease: Secondary | ICD-10-CM | POA: Diagnosis not present

## 2018-10-23 DIAGNOSIS — I129 Hypertensive chronic kidney disease with stage 1 through stage 4 chronic kidney disease, or unspecified chronic kidney disease: Secondary | ICD-10-CM | POA: Diagnosis not present

## 2018-10-23 DIAGNOSIS — E039 Hypothyroidism, unspecified: Secondary | ICD-10-CM | POA: Diagnosis not present

## 2018-10-24 DIAGNOSIS — E039 Hypothyroidism, unspecified: Secondary | ICD-10-CM | POA: Diagnosis not present

## 2018-10-24 DIAGNOSIS — K5900 Constipation, unspecified: Secondary | ICD-10-CM | POA: Diagnosis not present

## 2018-10-24 DIAGNOSIS — E1122 Type 2 diabetes mellitus with diabetic chronic kidney disease: Secondary | ICD-10-CM | POA: Diagnosis not present

## 2018-10-24 DIAGNOSIS — M810 Age-related osteoporosis without current pathological fracture: Secondary | ICD-10-CM | POA: Diagnosis not present

## 2018-10-24 DIAGNOSIS — Z7984 Long term (current) use of oral hypoglycemic drugs: Secondary | ICD-10-CM | POA: Diagnosis not present

## 2018-10-24 DIAGNOSIS — I129 Hypertensive chronic kidney disease with stage 1 through stage 4 chronic kidney disease, or unspecified chronic kidney disease: Secondary | ICD-10-CM | POA: Diagnosis not present

## 2018-10-24 DIAGNOSIS — E559 Vitamin D deficiency, unspecified: Secondary | ICD-10-CM | POA: Diagnosis not present

## 2018-10-24 DIAGNOSIS — M1990 Unspecified osteoarthritis, unspecified site: Secondary | ICD-10-CM | POA: Diagnosis not present

## 2018-10-24 DIAGNOSIS — S72141D Displaced intertrochanteric fracture of right femur, subsequent encounter for closed fracture with routine healing: Secondary | ICD-10-CM | POA: Diagnosis not present

## 2018-10-24 DIAGNOSIS — Z9181 History of falling: Secondary | ICD-10-CM | POA: Diagnosis not present

## 2018-10-24 DIAGNOSIS — E782 Mixed hyperlipidemia: Secondary | ICD-10-CM | POA: Diagnosis not present

## 2018-10-24 DIAGNOSIS — N183 Chronic kidney disease, stage 3 (moderate): Secondary | ICD-10-CM | POA: Diagnosis not present

## 2018-10-26 DIAGNOSIS — H40033 Anatomical narrow angle, bilateral: Secondary | ICD-10-CM | POA: Diagnosis not present

## 2018-10-26 DIAGNOSIS — H2513 Age-related nuclear cataract, bilateral: Secondary | ICD-10-CM | POA: Diagnosis not present

## 2018-10-26 LAB — HM DIABETES EYE EXAM

## 2018-10-30 ENCOUNTER — Encounter (INDEPENDENT_AMBULATORY_CARE_PROVIDER_SITE_OTHER): Payer: Self-pay | Admitting: Physician Assistant

## 2018-10-30 ENCOUNTER — Ambulatory Visit (INDEPENDENT_AMBULATORY_CARE_PROVIDER_SITE_OTHER): Payer: Medicare Other

## 2018-10-30 ENCOUNTER — Ambulatory Visit (INDEPENDENT_AMBULATORY_CARE_PROVIDER_SITE_OTHER): Payer: Medicare Other | Admitting: Physician Assistant

## 2018-10-30 VITALS — Ht 67.0 in | Wt 170.0 lb

## 2018-10-30 DIAGNOSIS — S72141A Displaced intertrochanteric fracture of right femur, initial encounter for closed fracture: Secondary | ICD-10-CM

## 2018-10-30 DIAGNOSIS — M25551 Pain in right hip: Secondary | ICD-10-CM | POA: Diagnosis not present

## 2018-10-30 MED ORDER — HYDROCODONE-ACETAMINOPHEN 5-325 MG PO TABS: 1.0000 | ORAL_TABLET | Freq: Four times a day (QID) | ORAL | 0 refills | Status: DC | PRN

## 2018-10-30 NOTE — Progress Notes (Signed)
Office Visit Note   Patient: Meredith FiremanMarie S Shaw           Date of Birth: [birth year]           MRN: [id number] Visit Date: 10/30/2018              Requested by: Johna SheriffVincent, Carol L, MD [address] [place], [place] [id number] PCP: Johna SheriffVincent, Carol L, MD   Assessment & Plan: Visit Diagnoses:  1. Pain in right hip   2. Closed fracture of femur, intertrochanteric, right, initial encounter [facility](HCC)     Plan: Meredith Shaw wishes to finish up with home health physical therapy and call our office if she wants to get outpatient therapy.  In regards to her left hip if this starts bothering her she will definitely return.  She has any questions concerns in regards to either hip.  She will return the office for follow-up.   Follow-Up Instructions: Return if symptoms worsen or fail to improve.   Orders:  Orders Placed This Encounter  Procedures  . XR HIP UNILAT W OR W/O PELVIS 2-3 VIEWS RIGHT   No orders of the defined types were placed in this encounter.     Procedures: No procedures performed   Clinical Data: No additional findings.   Subjective: Chief Complaint  Patient presents with  . Right Hip - Routine Post Op    S/P ORIF right hip    HPI Meredith Shaw comes in today now 10 weeks status post open reduction internal fixation of a right hip intertrochanteric fracture.  She states overall that she is trending towards improvement.  She is still working with home health physical therapy and is walking with rolling walker.  She is not having any pain in her left hip which she underwent open reduction internal fixation of an intertrochanteric fracture in May 2019 has developed some hip arthritis.  Review of Systems See HPI otherwise negative  Objective: Vital Signs: Ht 5\' 7"  (1.702 m)   Wt 170 lb (77.1 kg)   BMI 26.63 kg/m   Physical Exam Constitutional:      Appearance: Normal appearance.  Pulmonary:     Effort: Pulmonary effort is normal.  Neurological:     Mental Status: She  is alert.  Psychiatric:        Mood and Affect: Mood normal.     Ortho Exam Right calf supple nontender.  Good range of motion of the right hip without pain.  Dorsiflexion plantar flexion right ankle intact. Specialty Comments:  No specialty comments available.  Imaging: Xr Hip Unilat W Or W/o Pelvis 2-3 Views Right  Result Date: 10/30/2018 AP pelvis and lateral view of the right hip: Good consolidation of the right hip fracture.  Status post bilateral hip IM nailing without any evidence of hardware failure.  Left hip with moderate to severe hip arthritis.  Right hip joint appears well preserved.    PMFS History: Patient Active Problem List   Diagnosis Date Noted  . Closed fracture of femur, intertrochanteric, right, initial encounter (HCC) 08/19/2018  . Type 2 diabetes mellitus with complication, without long-term current use of insulin (HCC) 02/15/2018  . Closed left hip fracture (HCC) 02/10/2018  . CKD (chronic kidney disease) stage 3, GFR 30-59 ml/min (HCC) 02/10/2018  . Controlled type 2 diabetes mellitus with hyperglycemia (HCC) 02/10/2018  . Hip fracture (HCC) 02/10/2018  . Pressure injury of skin 02/10/2018  . Chest pain 02/19/2016  . Arthritis of left knee 04/09/2014  . Status  post total knee replacement 04/09/2014  . Arthropathy   . Diabetes (HCC)   . Hypertension   . Kidney stones   . Hypothyroidism   . Degenerative arthritis of knee 06/09/2012  . Flatulence, eructation, and gas pain 12/24/2005  . Abdominal or pelvic swelling, mass, or lump, other specified site 12/24/2005  . Osteoporosis 12/04/2004  . Symptomatic menopausal or female climacteric states 12/04/2004  . Arthropathy, lower leg 04/21/2004  . Hematuria 09/17/2003  . Other long term (current) drug therapy 07/05/2002  . Hyperlipidemia, mixed 03/01/2000   Past Medical History:  Diagnosis Date  . Arthritis    OA AND PAIN BOTH KNEES -RIGHT KNEE HURTS WORSE THAN LEFT  . Cataract   . Diabetes mellitus     ORAL MEDICATION - NO INSULIN  . Hypertension   . Hypothyroidism   . Kidney stones    NONE AT PRESENT TIME THAT PT IS AWARE OF  . Osteoporosis     Family History  Problem Relation Age of Onset  . Heart disease Mother   . Breast cancer Mother        5060's  . Stroke Father   . Heart disease Father   . Healthy Daughter   . Healthy Son   . Liver disease Maternal Grandmother        gall stones imbedded in liver - blead to death when in surgery   . Pneumonia Maternal Grandfather   . Diabetes Paternal Grandmother     Past Surgical History:  Procedure Laterality Date  . ABDOMINAL HYSTERECTOMY    . CHOLECYSTECTOMY  1972  . COLONOSCOPY    . EYE SURGERY     RIGHT CATARACT EXTRACTON WITH LENS IMPLANT  . GROWTH REMOVED FROM THYROID    . INTRAMEDULLARY (IM) NAIL INTERTROCHANTERIC Left 02/10/2018   Procedure: INTRAMEDULLARY (IM) NAIL INTERTROCHANTRIC;  Surgeon: Kathryne HitchBlackman, Christopher Y, MD;  Location: [facility];  Service: Orthopedics;  Laterality: Left;  . INTRAMEDULLARY (IM) NAIL INTERTROCHANTERIC Right 08/20/2018   Procedure: ORIF RIGHT HIP INTERTROC FRACTURE;  Surgeon: Kathryne HitchBlackman, Christopher Y, MD;  Location: [facility] OR;  Service: Orthopedics;  Laterality: Right;  . left hip surgery   02/2018  . ORIF HIP FRACTURE Right 08/20/2018  . PERCUTANEOUS NEPHROLITHOTOMY    . TOTAL KNEE ARTHROPLASTY  06/09/2012   Procedure: TOTAL KNEE ARTHROPLASTY;  Surgeon: Kathryne Hitchhristopher Y Blackman, MD;  Location: [facility];  Service: Orthopedics;  Laterality: Right;  Right Total Knee Arthroplasty  . TOTAL KNEE ARTHROPLASTY Left 04/09/2014   Procedure: LEFT TOTAL KNEE ARTHROPLASTY;  Surgeon: Kathryne Hitchhristopher Y Blackman, MD;  [facility];  Service: Orthopedics;  Laterality: Left;  . TUBAL LIGATION     Social History   Occupational History  . 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 Smoking status: Never Smoker  . Smokeless tobacco: Never Used  Substance  and Sexual Activity  . Alcohol use: No  . Drug use: No  . Sexual activity: Yes

## 2018-11-01 DIAGNOSIS — E1122 Type 2 diabetes mellitus with diabetic chronic kidney disease: Secondary | ICD-10-CM | POA: Diagnosis not present

## 2018-11-01 DIAGNOSIS — S72141D Displaced intertrochanteric fracture of right femur, subsequent encounter for closed fracture with routine healing: Secondary | ICD-10-CM | POA: Diagnosis not present

## 2018-11-01 DIAGNOSIS — E039 Hypothyroidism, unspecified: Secondary | ICD-10-CM | POA: Diagnosis not present

## 2018-11-01 DIAGNOSIS — M810 Age-related osteoporosis without current pathological fracture: Secondary | ICD-10-CM | POA: Diagnosis not present

## 2018-11-01 DIAGNOSIS — M1990 Unspecified osteoarthritis, unspecified site: Secondary | ICD-10-CM | POA: Diagnosis not present

## 2018-11-01 DIAGNOSIS — E559 Vitamin D deficiency, unspecified: Secondary | ICD-10-CM | POA: Diagnosis not present

## 2018-11-01 DIAGNOSIS — K5900 Constipation, unspecified: Secondary | ICD-10-CM | POA: Diagnosis not present

## 2018-11-01 DIAGNOSIS — N183 Chronic kidney disease, stage 3 (moderate): Secondary | ICD-10-CM | POA: Diagnosis not present

## 2018-11-01 DIAGNOSIS — Z9181 History of falling: Secondary | ICD-10-CM | POA: Diagnosis not present

## 2018-11-01 DIAGNOSIS — E782 Mixed hyperlipidemia: Secondary | ICD-10-CM | POA: Diagnosis not present

## 2018-11-01 DIAGNOSIS — Z7984 Long term (current) use of oral hypoglycemic drugs: Secondary | ICD-10-CM | POA: Diagnosis not present

## 2018-11-01 DIAGNOSIS — I129 Hypertensive chronic kidney disease with stage 1 through stage 4 chronic kidney disease, or unspecified chronic kidney disease: Secondary | ICD-10-CM | POA: Diagnosis not present

## 2018-11-03 DIAGNOSIS — E039 Hypothyroidism, unspecified: Secondary | ICD-10-CM | POA: Diagnosis not present

## 2018-11-03 DIAGNOSIS — S72141D Displaced intertrochanteric fracture of right femur, subsequent encounter for closed fracture with routine healing: Secondary | ICD-10-CM | POA: Diagnosis not present

## 2018-11-03 DIAGNOSIS — E782 Mixed hyperlipidemia: Secondary | ICD-10-CM | POA: Diagnosis not present

## 2018-11-03 DIAGNOSIS — M810 Age-related osteoporosis without current pathological fracture: Secondary | ICD-10-CM | POA: Diagnosis not present

## 2018-11-03 DIAGNOSIS — E559 Vitamin D deficiency, unspecified: Secondary | ICD-10-CM | POA: Diagnosis not present

## 2018-11-03 DIAGNOSIS — I129 Hypertensive chronic kidney disease with stage 1 through stage 4 chronic kidney disease, or unspecified chronic kidney disease: Secondary | ICD-10-CM | POA: Diagnosis not present

## 2018-11-03 DIAGNOSIS — Z7984 Long term (current) use of oral hypoglycemic drugs: Secondary | ICD-10-CM | POA: Diagnosis not present

## 2018-11-03 DIAGNOSIS — Z9181 History of falling: Secondary | ICD-10-CM | POA: Diagnosis not present

## 2018-11-03 DIAGNOSIS — N183 Chronic kidney disease, stage 3 (moderate): Secondary | ICD-10-CM | POA: Diagnosis not present

## 2018-11-03 DIAGNOSIS — M1990 Unspecified osteoarthritis, unspecified site: Secondary | ICD-10-CM | POA: Diagnosis not present

## 2018-11-03 DIAGNOSIS — E1122 Type 2 diabetes mellitus with diabetic chronic kidney disease: Secondary | ICD-10-CM | POA: Diagnosis not present

## 2018-11-03 DIAGNOSIS — K5900 Constipation, unspecified: Secondary | ICD-10-CM | POA: Diagnosis not present

## 2018-11-06 DIAGNOSIS — E039 Hypothyroidism, unspecified: Secondary | ICD-10-CM | POA: Diagnosis not present

## 2018-11-06 DIAGNOSIS — Z9181 History of falling: Secondary | ICD-10-CM | POA: Diagnosis not present

## 2018-11-06 DIAGNOSIS — I129 Hypertensive chronic kidney disease with stage 1 through stage 4 chronic kidney disease, or unspecified chronic kidney disease: Secondary | ICD-10-CM | POA: Diagnosis not present

## 2018-11-06 DIAGNOSIS — K5900 Constipation, unspecified: Secondary | ICD-10-CM | POA: Diagnosis not present

## 2018-11-06 DIAGNOSIS — E559 Vitamin D deficiency, unspecified: Secondary | ICD-10-CM | POA: Diagnosis not present

## 2018-11-06 DIAGNOSIS — M810 Age-related osteoporosis without current pathological fracture: Secondary | ICD-10-CM | POA: Diagnosis not present

## 2018-11-06 DIAGNOSIS — S72141D Displaced intertrochanteric fracture of right femur, subsequent encounter for closed fracture with routine healing: Secondary | ICD-10-CM | POA: Diagnosis not present

## 2018-11-06 DIAGNOSIS — E1122 Type 2 diabetes mellitus with diabetic chronic kidney disease: Secondary | ICD-10-CM | POA: Diagnosis not present

## 2018-11-06 DIAGNOSIS — E782 Mixed hyperlipidemia: Secondary | ICD-10-CM | POA: Diagnosis not present

## 2018-11-06 DIAGNOSIS — N183 Chronic kidney disease, stage 3 (moderate): Secondary | ICD-10-CM | POA: Diagnosis not present

## 2018-11-06 DIAGNOSIS — M1990 Unspecified osteoarthritis, unspecified site: Secondary | ICD-10-CM | POA: Diagnosis not present

## 2018-11-06 DIAGNOSIS — Z7984 Long term (current) use of oral hypoglycemic drugs: Secondary | ICD-10-CM | POA: Diagnosis not present

## 2018-11-07 NOTE — Progress Notes (Signed)
[facility] Note  11/10/2018     CHIEF COMPLAINT Patient presents for Retina Evaluation and Diabetic Eye Exam   HISTORY OF PRESENT ILLNESS: Meredith Shaw is a 73 y.o. female who presents to the clinic today for:   HPI    Retina Evaluation    In both eyes.  Associated Symptoms Negative for Flashes, Pain, Trauma, Fever, Weight Loss, Scalp Tenderness, Redness, Floaters, Distortion, Photophobia, Jaw Claudication, Fatigue, Shoulder/Hip pain, Glare and Blind Spot.  Context:  distance vision, mid-range vision and near vision.  Treatments tried include surgery.  Response to treatment was moderate improvement.  I, the attending physician,  performed the HPI with the patient and updated documentation appropriately.          Diabetic Eye Exam    Vision is stable.  Associated Symptoms Negative for Flashes, Pain, Trauma, Fever, Floaters, Redness, Scalp Tenderness, Weight Loss, Fatigue, Jaw Claudication, Photophobia, Distortion, Blind Spot, Glare and Shoulder/Hip pain.  Diabetes characteristics include Type 2.  This started 2 years ago.  Blood sugar level is controlled.  Last A1C 7.  I, the attending physician,  performed the HPI with the patient and updated documentation appropriately.          Comments    Referral of [facility] DR. DR. Marin Comment for retina/Dm  eval . Patient states she has not noticed any visual issues, denies flash,floaters and ocular pain. Pt is DM2 x 2 yrs, Bs are sable per patient , Patient does not monitor BS QD,(last BS unknown)A1C 7.0(11/09/18). Pt is on Metformin. Pt reports she had cataract sx OD appx 5 yrs ago with visual improvement.        Last edited by Bernarda Caffey, MD on 11/12/2018  3:45 PM. (History)    pt states she saw Dr. Marin Comment on Jan 16th for routine eye exam, pt states she was not aware she had a problem before she saw her, pt states she has had cataract sx in her right eye only, around 10 years ago, at [facility], pt states Dr. Marin Comment  told her she saw some blood in her right eye, pt endorses being diabetic and states she takes metformin, she also states she takes medication for blood pressure  Referring physician: Marin Comment [facility] [place], [place], [place] [id number]  HISTORICAL INFORMATION:   Selected notes from the MEDICAL RECORD NUMBER Referred by Dr. Maryjane Hurter for DM exam LEE: 01.16.20 (M. Le) [BCVA: OD: 20/40 OS: 20/50-3] Ocular Hx-PDR OU, macular edema OU, cataracts OU PMH-DM (takes metformin), arthritis, HTN, hypothyroidism    CURRENT MEDICATIONS: No current outpatient medications on file. (Ophthalmic Drugs)   No current facility-administered medications for this visit.  (Ophthalmic Drugs)   Current Outpatient Medications (Other)  Medication Sig  . amLODipine (NORVASC) 5 MG tablet Take 1 tablet (5 mg total) by mouth daily.  . Blood Glucose Monitoring Suppl (ONETOUCH VERIO) w/Device KIT 1 each by Does not apply route daily.  . cholecalciferol (VITAMIN D) 1000 units tablet Take 5,000 Units by mouth daily.   Marland Kitchen glucose blood (ONETOUCH VERIO) test strip Use as instructed  . HYDROcodone-acetaminophen (NORCO) 5-325 MG tablet Take 1-2 tablets by mouth every 6 (six) hours as needed for moderate pain. One to two tabs every 4-6 hours for pain  . Lancets (ONETOUCH ULTRASOFT) lancets Use as instructed  . levothyroxine (SYNTHROID, LEVOTHROID) 137 MCG tablet TAKE ONE (1) TABLET EACH DAY (Patient taking differently: Take 137 mcg by mouth daily  before breakfast. )  . lisinopril (PRINIVIL,ZESTRIL) 10 MG tablet Take 2 tablets (20 mg total) by mouth daily for 30 days.  . metFORMIN (GLUCOPHAGE-XR) 500 MG 24 hr tablet Take 1 tablet (500 mg total) by mouth 2 (two) times daily.  . metoprolol tartrate (LOPRESSOR) 100 MG tablet Take 1 tablet (100 mg total) by mouth 2 (two) times daily.  . naproxen sodium (ALEVE) 220 MG tablet Take 220 mg by mouth 2 (two) times daily as needed (for pain).  . pravastatin (PRAVACHOL) 40 MG tablet Take 1  tablet (40 mg total) by mouth at bedtime.   Current Facility-Administered Medications (Other)  Medication Route  . Bevacizumab (AVASTIN) SOLN 1.25 mg Intravitreal      REVIEW OF SYSTEMS: ROS    Positive for: Musculoskeletal, Endocrine, Eyes   Negative for: Constitutional, Gastrointestinal, Neurological, Skin, Genitourinary, HENT, Cardiovascular, Respiratory, Psychiatric, Allergic/Imm, Heme/Lymph   Last edited by Zenovia Jordan, LPN on 11/29/7586  3:25 AM. (History)       ALLERGIES Allergies  Allergen Reactions  . Penicillins Itching and Rash    PAST MEDICAL HISTORY Past Medical History:  Diagnosis Date  . Arthritis    OA AND PAIN BOTH KNEES -RIGHT KNEE HURTS WORSE THAN LEFT  . Cataract   . Diabetes mellitus    ORAL MEDICATION - NO INSULIN  . Hypertension   . Hypothyroidism   . Kidney stones    NONE AT PRESENT TIME THAT PT IS AWARE OF  . Osteoporosis    Past Surgical History:  Procedure Laterality Date  . ABDOMINAL HYSTERECTOMY    . CATARACT EXTRACTION    . CHOLECYSTECTOMY  1972  . COLONOSCOPY    . EYE SURGERY     RIGHT CATARACT EXTRACTON WITH LENS IMPLANT  . GROWTH REMOVED FROM THYROID    . INTRAMEDULLARY (IM) NAIL INTERTROCHANTERIC Left 02/10/2018   Procedure: INTRAMEDULLARY (IM) NAIL INTERTROCHANTRIC;  Surgeon: Mcarthur Rossetti, MD;  Location: [place];  Service: Orthopedics;  Laterality: Left;  . INTRAMEDULLARY (IM) NAIL INTERTROCHANTERIC Right 08/20/2018   Procedure: ORIF RIGHT HIP INTERTROC FRACTURE;  Surgeon: Mcarthur Rossetti, MD;  Location: [place];  Service: Orthopedics;  Laterality: Right;  . left hip surgery   02/2018  . ORIF HIP FRACTURE Right 08/20/2018  . PERCUTANEOUS NEPHROLITHOTOMY    . TOTAL KNEE ARTHROPLASTY  06/09/2012   Procedure: TOTAL KNEE ARTHROPLASTY;  Surgeon: Mcarthur Rossetti, MD;  Location: [facility];  Service: Orthopedics;  Laterality: Right;  Right Total Knee Arthroplasty  . TOTAL KNEE ARTHROPLASTY Left 04/09/2014    Procedure: LEFT TOTAL KNEE ARTHROPLASTY;  Surgeon: Mcarthur Rossetti, MD;  Location: [place];  Service: Orthopedics;  Laterality: Left;  . TUBAL LIGATION      FAMILY HISTORY Family History  Problem Relation Age of Onset  . Heart disease Mother   . Breast cancer Mother        6's  . Stroke Father   . Heart disease Father   . Healthy Daughter   . Healthy Son   . Liver disease Maternal Grandmother        gall stones imbedded in liver - blead to death when in surgery   . Pneumonia Maternal Grandfather   . Diabetes Paternal Grandmother     SOCIAL HISTORY Social History   Tobacco Use  . Smoking status: Never Smoker  . Smokeless tobacco: Never Used  Substance Use Topics  . Alcohol use: No  . Drug use: No         OPHTHALMIC  EXAM:  Base Eye Exam    Visual Acuity (Snellen - Linear)      Right Left   Dist cc 20/40 -2 20/70   Dist ph cc NI 20/50 +1   Correction:  Glasses       Tonometry (Tonopen, 8:30 AM)      Right Left   Pressure 12 18       Pupils      Dark Light Shape React APD   Right 3 2 Round Brisk None   Left 3 2 Round Brisk None       Visual Fields (Counting fingers)      Left Right    Full Full       Extraocular Movement      Right Left    Full, Ortho Full, Ortho       Neuro/Psych    Oriented x3:  Yes       Dilation    Both eyes:  1.0% Mydriacyl, 2.5% Phenylephrine @ 8:30 AM        Slit Lamp and Fundus Exam    Slit Lamp Exam      Right Left   Lids/Lashes Dermatochalasis - upper lid, Meibomian gland dysfunction Dermatochalasis - upper lid, Meibomian gland dysfunction   Conjunctiva/Sclera White and quiet White and quiet   Cornea 1+ Punctate epithelial erosions, Debris in tear film 1+ Punctate epithelial erosions, Debris in tear film   Anterior Chamber deep and clear deep and clear, narrow temporal angle   Iris Round and dilated, No NVI Round and dilated, No NVI   Lens PC IOL in good position with open PC, trace Posterior capsular  opacification 3+ Nuclear sclerosis, 3+ Cortical cataract, 3+ central Posterior subcapsular cataract   Vitreous mild Vitreous syneresis mild Vitreous syneresis       Fundus Exam      Right Left   Disc mild tilt, temporal PPA Pink and Sharp, temporal Peripapillary atrophy   C/D Ratio 0.5 0.2   Macula Blunted foveal reflex, IRH, +edema superior macula extending into fovea Blunted foveal reflex, Retinal pigment epithelial mottling, No heme or edema   Vessels Vascular attenuation, Tortuous, AV crossing changes Vascular attenuation, Tortuous, AV crossing changes   Periphery Attached, scattered reticular degeneration, few IRH temporally Attached, reticular degeneration          IMAGING AND PROCEDURES  Imaging and Procedures for _0 @  OCT, Retina - OU - Both Eyes       Right Eye Quality was good. Central Foveal Thickness: 484. Progression has no prior data. Findings include abnormal foveal contour, no SRF, intraretinal fluid, cystoid macular edema.   Left Eye Central Foveal Thickness: 275. Progression has no prior data. Findings include normal foveal contour, no IRF, no SRF.   Notes *Images captured and stored on drive  Diagnosis / Impression:  OD: superior BRVO w/ CME OS: NFP; no IRF/SRF  Clinical management:  See below  Abbreviations: NFP - Normal foveal profile. CME - cystoid macular edema. PED - pigment epithelial detachment. IRF - intraretinal fluid. SRF - subretinal fluid. EZ - ellipsoid zone. ERM - epiretinal membrane. ORA - outer retinal atrophy. ORT - outer retinal tubulation. SRHM - subretinal hyper-reflective material        Intravitreal Injection, Pharmacologic Agent - OD - Right Eye       Time Out 11/10/2018. 9:53 AM. Confirmed correct patient, procedure, site, and patient consented.   Anesthesia Topical anesthesia was used. Anesthetic medications included Lidocaine 2%, Proparacaine 0.5%.   Procedure  Preparation included 5% betadine to ocular surface,  eyelid speculum. A 30 gauge needle was used.   Injection:  1.25 mg Bevacizumab (AVASTIN) SOLN   NDC: 16109-604-54, Lot: [id number] , Expiration date: 11/21/2018   Route: Intravitreal, Site: Right Eye, Waste: 0 mL  Post-op Post injection exam found visual acuity of at least counting fingers. The patient tolerated the procedure well. There were no complications. The patient received written and verbal post procedure care education.                 ASSESSMENT/PLAN:    ICD-10-CM   1. Branch retinal vein occlusion of right eye with macular edema H34.8310 Intravitreal Injection, Pharmacologic Agent - OD - Right Eye    Bevacizumab (AVASTIN) SOLN 1.25 mg  2. Retinal edema H35.81 OCT, Retina - OU - Both Eyes  3. Essential hypertension I10   4. Hypertensive retinopathy of both eyes H35.033   5. Combined forms of age-related cataract of left eye H25.812   6. Pseudophakia Z96.1     1,2. BRVO w/ CME OD   - The natural history of retinal vein occlusion and macular edema and treatment options including observation, laser photocoagulation, and intravitreal antiVEGF injection with Avastin and Lucentis and Eylea and intravitreal injection of steroids with triamcinolone and Ozurdex and the complications of these procedures including loss of vision, infection, cataract, glaucoma, and retinal detachment were discussed with patient.  - Specifically discussed findings from [place] / [place] study regarding patient stabilization with anti-VEGF agents and increased potential for visual improvements.  Also discussed need for frequent follow up and potentially multiple injections given the chronic nature of the disease process  - BCVA 20/40-2   - OCT shows CME superior macula extending to fovea  - recommend IVA OD #1 today, 01.31.20  - RBA of procedure discussed, questions answered  - informed consent obtained and signed  - see procedure note  - F/U 4 weeks -- DFE/OCT/possible injection  3,4.  Hypertensive retinopathy OU  - discussed importance of tight BP control  - monitor  5. Mixed form age related cataract OS  - The symptoms of cataract, surgical options, and treatments and risks were discussed with patient.  - discussed diagnosis and progression  - visually significant central PSC  - would likely benefit from cataract extraction and IOL  6. Pseudophakia OD  - s/p CE/IOL  - beautiful surgery, doing well  - done at [facility] around 2010  - monitor   Ophthalmic Meds Ordered this visit:  Meds ordered this encounter  Medications  . Bevacizumab (AVASTIN) SOLN 1.25 mg       Return in about 4 weeks (around 12/08/2018) for Dilated Exam, OCT, Possible Injxn.  There are no Patient Instructions on file for this visit.   Explained the diagnoses, plan, and follow up with the patient and they expressed understanding.  Patient expressed understanding of the importance of proper follow up care.   This document serves as a record of services personally performed by Gardiner Sleeper, MD, PhD. It was created on their behalf by Ernest Mallick, OA, an ophthalmic assistant. The creation of this record is the provider's dictation and/or activities during the visit.    Electronically signed by: Ernest Mallick, OA  01.28.2020 3:48 PM    Gardiner Sleeper, M.D., Ph.D. Diseases & Surgery of the Retina and Vitreous [facility]  I have reviewed the above documentation for accuracy and completeness, and I agree with the above. Sharyon Cable  Coralyn Pear, M.D., Ph.D. 11/12/18 3:48 PM    Abbreviations: M myopia (nearsighted); A astigmatism; H hyperopia (farsighted); P presbyopia; Mrx spectacle prescription;  CTL contact lenses; OD right eye; OS left eye; OU both eyes  XT exotropia; ET esotropia; PEK punctate epithelial keratitis; PEE punctate epithelial erosions; DES dry eye syndrome; MGD meibomian gland dysfunction; ATs artificial tears; PFAT's preservative free artificial  tears; [place] nuclear sclerotic cataract; PSC posterior subcapsular cataract; ERM epi-retinal membrane; PVD posterior vitreous detachment; RD retinal detachment; DM diabetes mellitus; DR diabetic retinopathy; NPDR non-proliferative diabetic retinopathy; PDR proliferative diabetic retinopathy; CSME clinically significant macular edema; DME diabetic macular edema; dbh dot blot hemorrhages; CWS cotton wool spot; POAG primary open angle glaucoma; C/D cup-to-disc ratio; HVF humphrey visual field; GVF goldmann visual field; OCT optical coherence tomography; IOP intraocular pressure; BRVO Branch retinal vein occlusion; CRVO central retinal vein occlusion; CRAO central retinal artery occlusion; BRAO branch retinal artery occlusion; RT retinal tear; SB scleral buckle; PPV pars plana vitrectomy; VH Vitreous hemorrhage; PRP panretinal laser photocoagulation; IVK intravitreal kenalog; VMT vitreomacular traction; MH Macular hole;  NVD neovascularization of the disc; NVE neovascularization elsewhere; AREDS age related eye disease study; ARMD age related macular degeneration; POAG primary open angle glaucoma; EBMD epithelial/anterior basement membrane dystrophy; ACIOL anterior chamber intraocular lens; IOL intraocular lens; PCIOL posterior chamber intraocular lens; Phaco/IOL phacoemulsification with intraocular lens placement; [place] photorefractive keratectomy; LASIK laser assisted in situ keratomileusis; HTN hypertension; DM diabetes mellitus; COPD chronic obstructive pulmonary disease

## 2018-11-09 ENCOUNTER — Ambulatory Visit (INDEPENDENT_AMBULATORY_CARE_PROVIDER_SITE_OTHER): Payer: Medicare Other | Admitting: Family Medicine

## 2018-11-09 ENCOUNTER — Ambulatory Visit: Payer: Medicare Other | Admitting: Pediatrics

## 2018-11-09 ENCOUNTER — Encounter: Payer: Self-pay | Admitting: Family Medicine

## 2018-11-09 VITALS — BP 153/80 | HR 53 | Temp 97.4°F | Ht 67.0 in | Wt 174.0 lb

## 2018-11-09 DIAGNOSIS — E118 Type 2 diabetes mellitus with unspecified complications: Secondary | ICD-10-CM | POA: Diagnosis not present

## 2018-11-09 DIAGNOSIS — E785 Hyperlipidemia, unspecified: Secondary | ICD-10-CM

## 2018-11-09 DIAGNOSIS — Z1212 Encounter for screening for malignant neoplasm of rectum: Secondary | ICD-10-CM

## 2018-11-09 DIAGNOSIS — H3500 Unspecified background retinopathy: Secondary | ICD-10-CM

## 2018-11-09 DIAGNOSIS — E1159 Type 2 diabetes mellitus with other circulatory complications: Secondary | ICD-10-CM

## 2018-11-09 DIAGNOSIS — M8000XD Age-related osteoporosis with current pathological fracture, unspecified site, subsequent encounter for fracture with routine healing: Secondary | ICD-10-CM

## 2018-11-09 DIAGNOSIS — N183 Chronic kidney disease, stage 3 unspecified: Secondary | ICD-10-CM

## 2018-11-09 DIAGNOSIS — I10 Essential (primary) hypertension: Secondary | ICD-10-CM | POA: Diagnosis not present

## 2018-11-09 DIAGNOSIS — Z1211 Encounter for screening for malignant neoplasm of colon: Secondary | ICD-10-CM

## 2018-11-09 DIAGNOSIS — E1169 Type 2 diabetes mellitus with other specified complication: Secondary | ICD-10-CM

## 2018-11-09 DIAGNOSIS — E039 Hypothyroidism, unspecified: Secondary | ICD-10-CM

## 2018-11-09 DIAGNOSIS — I152 Hypertension secondary to endocrine disorders: Secondary | ICD-10-CM

## 2018-11-09 DIAGNOSIS — E11319 Type 2 diabetes mellitus with unspecified diabetic retinopathy without macular edema: Secondary | ICD-10-CM | POA: Insufficient documentation

## 2018-11-09 LAB — BAYER DCA HB A1C WAIVED: HB A1C: 7 % — AB (ref <7.0)

## 2018-11-09 MED ORDER — PRAVASTATIN SODIUM 40 MG PO TABS: 40.0000 mg | ORAL_TABLET | Freq: Every day | ORAL | 1 refills | Status: DC

## 2018-11-09 MED ORDER — LISINOPRIL 10 MG PO TABS: 20.0000 mg | ORAL_TABLET | Freq: Every day | ORAL | 6 refills | Status: DC

## 2018-11-09 MED ORDER — AMLODIPINE BESYLATE 5 MG PO TABS: 5.0000 mg | ORAL_TABLET | Freq: Every day | ORAL | 1 refills | Status: DC

## 2018-11-09 MED ORDER — METOPROLOL TARTRATE 100 MG PO TABS: 100.0000 mg | ORAL_TABLET | Freq: Two times a day (BID) | ORAL | 1 refills | Status: DC

## 2018-11-09 MED ORDER — DENOSUMAB 60 MG/ML ~~LOC~~ SOSY: 60.0000 mg | PREFILLED_SYRINGE | Freq: Once | SUBCUTANEOUS | 0 refills | Status: AC

## 2018-11-09 NOTE — Progress Notes (Signed)
Subjective:    Patient ID: Meredith Shaw, female    DOB: 06-02-1946, 73 y.o.   MRN: [id number]  Chief Complaint:  Medical Management of Chronic Issues   HPI: Meredith Shaw is a 73 y.o. female presenting on 11/09/2018 for Medical Management of Chronic Issues   1. Type 2 diabetes mellitus with complication, without long-term current use of insulin (HCC)  Diabetes mellitus 2 Compliant with meds - Yes Checking CBGs? No Exercising regularly? - PT for recent hip fracture Watching carbohydrate intake? - Yes Neuropathy ? - No Hypoglycemic events - No Pertinent ROS:  Polyuria - No Polydipsia - No Vision problems - Yes, seeing ophthalmology for recent diagnosis of retinopathy   2. Acquired hypothyroidism  Compliant with medications. Denies fatigue, cold or heat intolerance, mood disturbances, dry skin, or hair changes.   3. CKD (chronic kidney disease) stage 3, GFR 30-59 ml/min (HCC)  No decrease in urination. Compliant with HTN and DM medications. Denies increased swelling, anuria, confusion, or weakness.    4. Hypertension associated with type 2 diabetes mellitus ([place])  Complaint with meds - Yes, but has not taken today Checking BP at home ranging 120/80 Exercising Regularly - Yes, PT for recent hip fracture Watching Salt intake - Yes Pertinent ROS:  Headache - No Chest pain - No Dyspnea - No Palpitations - Yes LE edema - Yes, right leg due to recent hip fracture  They report good compliance with medications and can restate their regimen by memory. No medication side effects.  BP Readings from Last 3 Encounters:  11/09/18 (!) 153/80  08/22/18 116/66  08/09/18 136/80     5. Retinopathy  Seeing opthalmology, has an appointment tomorrow.    6. Hyperlipidemia associated with type 2 diabetes mellitus ([place])  Does try to avoid fatty, greasy foods. Does try to exercise on a regular basis. Is attending PT regularly due to recent hip fracture.    7. Osteoporosis with  current pathological fracture with routine healing, unspecified osteoporosis type, subsequent encounter  Closed right hip fracture with surgical repair. Does not take calcium on a regular basis due to history of renal calculi. Does take Vitamin D supplement. Was previously prescribed Prolia but has not initiated therapy. Will reorder today.       Relevant past medical, surgical, family, and social history reviewed and updated as indicated.  Allergies and medications reviewed and updated.   Past Medical History:  Diagnosis Date  . Arthritis    OA AND PAIN BOTH KNEES -RIGHT KNEE HURTS WORSE THAN LEFT  . Cataract   . Diabetes mellitus    ORAL MEDICATION - NO INSULIN  . Hypertension   . Hypothyroidism   . Kidney stones    NONE AT PRESENT TIME THAT PT IS AWARE OF  . Osteoporosis     Past Surgical History:  Procedure Laterality Date  . ABDOMINAL HYSTERECTOMY    . CHOLECYSTECTOMY  1972  . COLONOSCOPY    . EYE SURGERY     RIGHT CATARACT EXTRACTON WITH LENS IMPLANT  . GROWTH REMOVED FROM THYROID    . INTRAMEDULLARY (IM) NAIL INTERTROCHANTERIC Left 02/10/2018   Procedure: INTRAMEDULLARY (IM) NAIL INTERTROCHANTRIC;  Surgeon: Mcarthur Rossetti, MD;  Location: [facility];  Service: Orthopedics;  Laterality: Left;  . INTRAMEDULLARY (IM) NAIL INTERTROCHANTERIC Right 08/20/2018   Procedure: ORIF RIGHT HIP INTERTROC FRACTURE;  Surgeon: Mcarthur Rossetti, MD;  Location: [facility];  Service: Orthopedics;  Laterality: Right;  . left hip surgery  02/2018  . ORIF HIP FRACTURE Right 08/20/2018  . PERCUTANEOUS NEPHROLITHOTOMY    . TOTAL KNEE ARTHROPLASTY  06/09/2012   Procedure: TOTAL KNEE ARTHROPLASTY;  Surgeon: Mcarthur Rossetti, MD;  Location: [facility];  Service: Orthopedics;  Laterality: Right;  Right Total Knee Arthroplasty  . TOTAL KNEE ARTHROPLASTY Left 04/09/2014   Procedure: LEFT TOTAL KNEE ARTHROPLASTY;  Surgeon: Mcarthur Rossetti, MD;  Location: [facility];  Service: Orthopedics;   Laterality: Left;  . TUBAL LIGATION      Social History   Socioeconomic History  . Marital status: Married    Spouse name: Berneta Sages   . Number of children: 2  . Years of education: Not on file  . Highest education level: Not on file  Occupational History  . Occupation: retired     Fish farm manager: [employer]    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    Comment: bank - 46yr  Social Needs  . Financial resource strain: Not on file  . Food insecurity:    Worry: Not on file    Inability: Not on file  . Transportation needs:    Medical: Not on file    Non-medical: Not on file  Tobacco Use  . Smoking status: Never Smoker  . Smokeless tobacco: Never Used  Substance and Sexual Activity  . Alcohol use: No  . Drug use: No  . Sexual activity: Yes  Lifestyle  . Physical activity:    Days per week: Not on file    Minutes per session: Not on file  . Stress: Not on file  Relationships  . Social connections:    Talks on phone: Not on file    Gets together: Not on file    Attends religious service: Not on file    Active member of club or organization: Not on file    Attends meetings of clubs or organizations: Not on file    Relationship status: Not on file  . Intimate partner violence:    Fear of current or ex partner: Not on file    Emotionally abused: Not on file    Physically abused: Not on file    Forced sexual activity: Not on file  Other Topics Concern  . Not on file  Social History Narrative  . Not on file    Outpatient Encounter Medications as of 11/09/2018  Medication Sig  . amLODipine (NORVASC) 5 MG tablet Take 1 tablet (5 mg total) by mouth daily.  . Blood Glucose Monitoring Suppl (ONETOUCH VERIO) w/Device KIT 1 each by Does not apply route daily.  . cholecalciferol (VITAMIN D) 1000 units tablet Take 5,000 Units by mouth daily.   .Marland Kitchenglucose blood (ONETOUCH VERIO) test strip Use as instructed  . HYDROcodone-acetaminophen (NORCO) 5-325 MG tablet Take 1-2 tablets by mouth every 6 (six) hours  as needed for moderate pain. One to two tabs every 4-6 hours for pain  . Lancets (ONETOUCH ULTRASOFT) lancets Use as instructed  . levothyroxine (SYNTHROID, LEVOTHROID) 137 MCG tablet TAKE ONE (1) TABLET EACH DAY (Patient taking differently: Take 137 mcg by mouth daily before breakfast. )  . lisinopril (PRINIVIL,ZESTRIL) 10 MG tablet Take 2 tablets (20 mg total) by mouth daily for 30 days.  . metFORMIN (GLUCOPHAGE-XR) 500 MG 24 hr tablet Take 1 tablet (500 mg total) by mouth 2 (two) times daily.  . metoprolol tartrate (LOPRESSOR) 100 MG tablet Take 1 tablet (100 mg total) by mouth 2 (two) times daily.  . naproxen sodium (ALEVE) 220 MG tablet Take 220 mg  by mouth 2 (two) times daily as needed (for pain).  . pravastatin (PRAVACHOL) 40 MG tablet Take 1 tablet (40 mg total) by mouth at bedtime.  . [DISCONTINUED] amLODipine (NORVASC) 5 MG tablet Take 1 tablet (5 mg total) by mouth daily.  . [DISCONTINUED] lisinopril (PRINIVIL,ZESTRIL) 10 MG tablet Take 1 tablet (10 mg total) by mouth daily. (Patient taking differently: Take 20 mg by mouth daily. )  . [DISCONTINUED] metFORMIN (GLUCOPHAGE) 1000 MG tablet Take 1,000 mg by mouth 2 (two) times daily with a meal.  . [DISCONTINUED] metoprolol tartrate (LOPRESSOR) 100 MG tablet Take 1 tablet (100 mg total) by mouth 2 (two) times daily.  . [DISCONTINUED] pravastatin (PRAVACHOL) 40 MG tablet Take 1 tablet (40 mg total) by mouth at bedtime.  Marland Kitchen denosumab (PROLIA) 60 MG/ML SOSY injection Inject 60 mg into the skin once for 1 dose.  . [DISCONTINUED] amLODipine (NORVASC) 10 MG tablet Take 10 mg by mouth daily.   No facility-administered encounter medications on file as of 11/09/2018.     Allergies  Allergen Reactions  . Penicillins Itching and Rash    Review of Systems  Constitutional: Negative for chills, fatigue and fever.  Eyes: Negative for photophobia and visual disturbance.  Respiratory: Negative for cough, chest tightness, shortness of breath and  wheezing.   Cardiovascular: Positive for leg swelling (right leg, recent hip fracture with surgical repair). Negative for chest pain and palpitations.  Gastrointestinal: Negative for abdominal distention, abdominal pain, anal bleeding, blood in stool, constipation, diarrhea, nausea, rectal pain and vomiting.  Endocrine: Negative for cold intolerance, heat intolerance, polydipsia, polyphagia and polyuria.  Genitourinary: Negative for decreased urine volume and difficulty urinating.  Musculoskeletal: Positive for arthralgias and gait problem (recent hip fracture with surgical repair).  Neurological: Negative for dizziness, tremors, seizures, syncope, facial asymmetry, speech difficulty, weakness, light-headedness, numbness and headaches.  Hematological: Does not bruise/bleed easily.  Psychiatric/Behavioral: Negative for confusion.  All other systems reviewed and are negative.       Objective:    BP (!) 153/80   Pulse (!) 53   Temp (!) 97.4 F (36.3 C) (Oral)   Ht 5' 7"  (1.702 m)   Wt 174 lb (78.9 kg)   BMI 27.25 kg/m    Wt Readings from Last 3 Encounters:  11/09/18 174 lb (78.9 kg)  10/30/18 170 lb (77.1 kg)  08/19/18 170 lb (77.1 kg)    Physical Exam Vitals signs and nursing note reviewed.  Constitutional:      General: She is not in acute distress.    Appearance: Normal appearance. She is well-developed and well-groomed. She is not ill-appearing or toxic-appearing.  HENT:     Head: Normocephalic and atraumatic.     Right Ear: Tympanic membrane, ear canal and external ear normal.     Left Ear: Tympanic membrane, ear canal and external ear normal.     Nose: Nose normal.     Mouth/Throat:     Mouth: Mucous membranes are moist.     Pharynx: Oropharynx is clear. No oropharyngeal exudate or posterior oropharyngeal erythema.  Eyes:     Conjunctiva/sclera: Conjunctivae normal.     Pupils: Pupils are equal, round, and reactive to light.  Neck:     Musculoskeletal: Normal range  of motion and neck supple. No neck rigidity or muscular tenderness.     Thyroid: No thyroid mass, thyromegaly or thyroid tenderness.     Vascular: No carotid bruit or JVD.     Trachea: Trachea and phonation normal.  Cardiovascular:  Rate and Rhythm: Normal rate and regular rhythm.     Pulses: Normal pulses.     Heart sounds: Normal heart sounds. No murmur. No friction rub. No gallop.   Pulmonary:     Effort: Pulmonary effort is normal. No respiratory distress.     Breath sounds: Normal breath sounds.  Abdominal:     General: Bowel sounds are normal. There is no distension.     Palpations: Abdomen is soft.     Tenderness: There is no abdominal tenderness. There is no right CVA tenderness or left CVA tenderness.  Musculoskeletal:        General: Swelling (right lower extremity) present.     Right lower leg: 1+ Edema present.     Left lower leg: No edema.  Lymphadenopathy:     Cervical: No cervical adenopathy.  Skin:    General: Skin is warm and dry.     Capillary Refill: Capillary refill takes less than 2 seconds.  Neurological:     General: No focal deficit present.     Mental Status: She is alert and oriented to person, place, and time.     Cranial Nerves: Cranial nerves are intact.     Sensory: Sensation is intact.     Motor: Motor function is intact.     Coordination: Coordination is intact.     Deep Tendon Reflexes: Reflexes are normal and symmetric.  Psychiatric:        Attention and Perception: Attention and perception normal.        Mood and Affect: Mood normal.        Speech: Speech normal.        Behavior: Behavior normal. Behavior is cooperative.        Thought Content: Thought content normal.        Cognition and Memory: Cognition and memory normal.        Judgment: Judgment normal.     Results for orders placed or performed in visit on 11/02/18  HM DIABETES EYE EXAM  Result Value Ref Range   HM Diabetic Eye Exam Retinopathy (A) No Retinopathy     A1C 7 in  office today.   Pertinent labs & imaging results that were available during my care of the patient were reviewed by me and considered in my medical decision making.  Assessment & Plan:  Binta was seen today for medical management of chronic issues.  Diagnoses and all orders for this visit:  Type 2 diabetes mellitus with complication, without long-term current use of insulin (HCC) Stable, A1C 7 today. Continue medications as prescribed. Labs pending. Diet and exercise encouraged.  -     CMP14+EGFR -     Microalbumin / creatinine urine ratio -     Lipid panel -     Bayer DCA Hb A1c Waived  Acquired hypothyroidism Labs pending. Continue medications as prescribed. Will adjust dose if warranted.  -     TSH  CKD (chronic kidney disease) stage 3, GFR 30-59 ml/min (HCC) Labs pending. Will refer to nephrology if warranted.  -     CBC with Differential/Platelet       -     CMP14+EGFR  Hypertension associated with type 2 diabetes mellitus ([place]) Has not taken blood pressure medications this morning. Blood pressure running 120/80 at home. Labs pending. Return if BP remains elevated at home. Report any new symptoms or worsening blood pressures. Continue medications as prescribed.  -     CBC with Differential/Platelet -  Microalbumin / creatinine urine ratio -     Lipid panel -     TSH -     lisinopril (PRINIVIL,ZESTRIL) 10 MG tablet; Take 2 tablets (20 mg total) by mouth daily for 30 days. -     metoprolol tartrate (LOPRESSOR) 100 MG tablet; Take 1 tablet (100 mg total) by mouth 2 (two) times daily. -     amLODipine (NORVASC) 5 MG tablet; Take 1 tablet (5 mg total) by mouth daily.  Retinopathy Has an appointment with opthalmology tomorrow.   Hyperlipidemia associated with type 2 diabetes mellitus ([place]) Diet and exercise encouraged. Labs pending. Medications as prescribed.  -     pravastatin (PRAVACHOL) 40 MG tablet; Take 1 tablet (40 mg total) by mouth at bedtime.       -     Lipid  panel  Osteoporosis with current pathological fracture with routine healing, unspecified osteoporosis type, subsequent encounter Was prescribed Prolia in the past, has not initiated therapy. Order placed today for initiation of therapy.  -     denosumab (PROLIA) 60 MG/ML SOSY injection; Inject 60 mg into the skin once for 1 dose.  Screening for colorectal cancer -     Cologuard     Continue all other maintenance medications.  Follow up plan: Return in about 3 months (around 02/08/2019), or if symptoms worsen or fail to improve.  Educational handout given for diabetes  The above assessment and management plan was discussed with the patient. The patient verbalized understanding of and has agreed to the management plan. Patient is aware to call the clinic if symptoms persist or worsen. Patient is aware when to return to the clinic for a follow-up visit. Patient educated on when it is appropriate to go to the emergency department.   Monia Pouch, FNP-C [facility] [phone]

## 2018-11-09 NOTE — Patient Instructions (Signed)
Diabetes Basics    Diabetes (diabetes mellitus) is a long-term (chronic) disease. It occurs when the body does not properly use sugar (glucose) that is released from food after you eat.  Diabetes may be caused by one or both of these problems:  · Your pancreas does not make enough of a hormone called insulin.  · Your body does not react in a normal way to insulin that it makes.  Insulin lets sugars (glucose) go into cells in your body. This gives you energy. If you have diabetes, sugars cannot get into cells. This causes high blood sugar (hyperglycemia).  Follow these instructions at home:  How is diabetes treated?  You may need to take insulin or other diabetes medicines daily to keep your blood sugar in balance. Take your diabetes medicines every day as told by your doctor. List your diabetes medicines here:  Diabetes medicines  · Name of medicine: ______________________________  ? Amount (dose): _______________ Time (a.m./p.m.): _______________ Notes: ___________________________________  · Name of medicine: ______________________________  ? Amount (dose): _______________ Time (a.m./p.m.): _______________ Notes: ___________________________________  · Name of medicine: ______________________________  ? Amount (dose): _______________ Time (a.m./p.m.): _______________ Notes: ___________________________________  If you use insulin, you will learn how to give yourself insulin by injection. You may need to adjust the amount based on the food that you eat. List the types of insulin you use here:  Insulin  · Insulin type: ______________________________  ? Amount (dose): _______________ Time (a.m./p.m.): _______________ Notes: ___________________________________  · Insulin type: ______________________________  ? Amount (dose): _______________ Time (a.m./p.m.): _______________ Notes: ___________________________________  · Insulin type: ______________________________  ? Amount (dose): _______________ Time (a.m./p.m.):  _______________ Notes: ___________________________________  · Insulin type: ______________________________  ? Amount (dose): _______________ Time (a.m./p.m.): _______________ Notes: ___________________________________  · Insulin type: ______________________________  ? Amount (dose): _______________ Time (a.m./p.m.): _______________ Notes: ___________________________________  How do I manage my blood sugar?    Check your blood sugar levels using a blood glucose monitor as directed by your doctor.  Your doctor will set treatment goals for you. Generally, you should have these blood sugar levels:  · Before meals (preprandial): 80-130 mg/dL (4.4-7.2 mmol/L).  · After meals (postprandial): below 180 mg/dL (10 mmol/L).  · A1c level: less than 7%.  Write down the times that you will check your blood sugar levels:  Blood sugar checks  · Time: _______________ Notes: ___________________________________  · Time: _______________ Notes: ___________________________________  · Time: _______________ Notes: ___________________________________  · Time: _______________ Notes: ___________________________________  · Time: _______________ Notes: ___________________________________  · Time: _______________ Notes: ___________________________________    What do I need to know about low blood sugar?  Low blood sugar is called hypoglycemia. This is when blood sugar is at or below 70 mg/dL (3.9 mmol/L). Symptoms may include:  · Feeling:  ? Hungry.  ? Worried or nervous (anxious).  ? Sweaty and clammy.  ? Confused.  ? Dizzy.  ? Sleepy.  ? Sick to your stomach (nauseous).  · Having:  ? A fast heartbeat.  ? A headache.  ? A change in your vision.  ? Tingling or no feeling (numbness) around the mouth, lips, or tongue.  ? Jerky movements that you cannot control (seizure).  · Having trouble with:  ? Moving (coordination).  ? Sleeping.  ? Passing out (fainting).  ? Getting upset easily (irritability).  Treating low blood sugar  To treat low blood  sugar, eat or drink something sugary right away. If you can think clearly and swallow safely, follow the 15:15   rule:  · Take 15 grams of a fast-acting carb (carbohydrate). Talk with your doctor about how much you should take.  · Some fast-acting carbs are:  ? Sugar tablets (glucose pills). Take 3-4 glucose pills.  ? 6-8 pieces of hard candy.  ? 4-6 oz (120-150 mL) of fruit juice.  ? 4-6 oz (120-150 mL) of regular (not diet) soda.  ? 1 Tbsp (15 mL) honey or sugar.  · Check your blood sugar 15 minutes after you take the carb.  · If your blood sugar is still at or below 70 mg/dL (3.9 mmol/L), take 15 grams of a carb again.  · If your blood sugar does not go above 70 mg/dL (3.9 mmol/L) after 3 tries, get help right away.  · After your blood sugar goes back to normal, eat a meal or a snack within 1 hour.  Treating very low blood sugar  If your blood sugar is at or below 54 mg/dL (3 mmol/L), you have very low blood sugar (severe hypoglycemia). This is an emergency. Do not wait to see if the symptoms will go away. Get medical help right away. Call your local emergency services (911 in the [place] Do not drive yourself to the hospital.  Questions to ask your health care provider  · Do I need to meet with a diabetes educator?  · What equipment will I need to care for myself at home?  · What diabetes medicines do I need? When should I take them?  · How often do I need to check my blood sugar?  · What number can I call if I have questions?  · When is my next doctor's visit?  · Where can I find a support group for people with diabetes?  Where to find more information  · American Diabetes Association: www.diabetes.org  · American Association of Diabetes Educators: www.diabeteseducator.org/patient-resources  Contact a doctor if:  · Your blood sugar is at or above 240 mg/dL (13.3 mmol/L) for 2 days in a row.  · You have been sick or have had a fever for 2 days or more, and you are not getting better.  · You have any of these  problems for more than 6 hours:  ? You cannot eat or drink.  ? You feel sick to your stomach (nauseous).  ? You throw up (vomit).  ? You have watery poop (diarrhea).  Get help right away if:  · Your blood sugar is lower than 54 mg/dL (3 mmol/L).  · You get confused.  · You have trouble:  ? Thinking clearly.  ? Breathing.  Summary  · Diabetes (diabetes mellitus) is a long-term (chronic) disease. It occurs when the body does not properly use sugar (glucose) that is released from food after digestion.  · Take insulin and diabetes medicines as told.  · Check your blood sugar every day, as often as told.  · Keep all follow-up visits as told by your doctor. This is important.  This information is not intended to replace advice given to you by your health care provider. Make sure you discuss any questions you have with your health care provider.  Document Released: 12/30/2017 Document Revised: 03/20/2018 Document Reviewed: 12/30/2017  Elsevier Interactive Patient Education © 2019 [employer]

## 2018-11-10 ENCOUNTER — Encounter (INDEPENDENT_AMBULATORY_CARE_PROVIDER_SITE_OTHER): Payer: Self-pay | Admitting: Ophthalmology

## 2018-11-10 ENCOUNTER — Ambulatory Visit (INDEPENDENT_AMBULATORY_CARE_PROVIDER_SITE_OTHER): Payer: Medicare Other | Admitting: Ophthalmology

## 2018-11-10 DIAGNOSIS — H3581 Retinal edema: Secondary | ICD-10-CM | POA: Diagnosis not present

## 2018-11-10 DIAGNOSIS — H25812 Combined forms of age-related cataract, left eye: Secondary | ICD-10-CM

## 2018-11-10 DIAGNOSIS — I10 Essential (primary) hypertension: Secondary | ICD-10-CM | POA: Diagnosis not present

## 2018-11-10 DIAGNOSIS — H35033 Hypertensive retinopathy, bilateral: Secondary | ICD-10-CM

## 2018-11-10 DIAGNOSIS — H34831 Tributary (branch) retinal vein occlusion, right eye, with macular edema: Secondary | ICD-10-CM | POA: Diagnosis not present

## 2018-11-10 DIAGNOSIS — Z961 Presence of intraocular lens: Secondary | ICD-10-CM

## 2018-11-10 LAB — LIPID PANEL
Chol/HDL Ratio: 4.3 ratio (ref 0.0–4.4)
Cholesterol, Total: 152 mg/dL (ref 100–199)
HDL: 35 mg/dL — ABNORMAL LOW (ref >39)
LDL CALC: 77 mg/dL (ref 0–99)
Triglycerides: 199 mg/dL — ABNORMAL HIGH (ref 0–149)
VLDL CHOLESTEROL CAL: 40 mg/dL (ref 5–40)

## 2018-11-10 LAB — CMP14+EGFR
ALBUMIN: 4.1 g/dL (ref 3.7–4.7)
ALT: 14 IU/L (ref 0–32)
AST: 14 IU/L (ref 0–40)
Albumin/Globulin Ratio: 1.9 (ref 1.2–2.2)
Alkaline Phosphatase: 79 IU/L (ref 39–117)
BUN / CREAT RATIO: 22 (ref 12–28)
BUN: 25 mg/dL (ref 8–27)
Bilirubin Total: 0.2 mg/dL (ref 0.0–1.2)
CALCIUM: 9.6 mg/dL (ref 8.7–10.3)
CO2: 21 mmol/L (ref 20–29)
Chloride: 103 mmol/L (ref 96–106)
Creatinine, Ser: 1.14 mg/dL — ABNORMAL HIGH (ref 0.57–1.00)
GFR, EST AFRICAN AMERICAN: 56 mL/min/{1.73_m2} — AB (ref >59)
GFR, EST NON AFRICAN AMERICAN: 48 mL/min/{1.73_m2} — AB (ref >59)
GLUCOSE: 218 mg/dL — AB (ref 65–99)
Globulin, Total: 2.2 g/dL (ref 1.5–4.5)
Potassium: 4.6 mmol/L (ref 3.5–5.2)
Sodium: 140 mmol/L (ref 134–144)
TOTAL PROTEIN: 6.3 g/dL (ref 6.0–8.5)

## 2018-11-10 LAB — CBC WITH DIFFERENTIAL/PLATELET
BASOS ABS: 0.1 10*3/uL (ref 0.0–0.2)
Basos: 1 %
EOS (ABSOLUTE): 0.2 10*3/uL (ref 0.0–0.4)
EOS: 3 %
HEMOGLOBIN: 12.2 g/dL (ref 11.1–15.9)
Hematocrit: 35.1 % (ref 34.0–46.6)
IMMATURE GRANS (ABS): 0 10*3/uL (ref 0.0–0.1)
IMMATURE GRANULOCYTES: 0 %
LYMPHS ABS: 1.6 10*3/uL (ref 0.7–3.1)
Lymphs: 18 %
MCH: 30.5 pg (ref 26.6–33.0)
MCHC: 34.8 g/dL (ref 31.5–35.7)
MCV: 88 fL (ref 79–97)
MONOCYTES: 8 %
Monocytes Absolute: 0.7 10*3/uL (ref 0.1–0.9)
NEUTROS PCT: 70 %
Neutrophils Absolute: 6.2 10*3/uL (ref 1.4–7.0)
Platelets: 255 10*3/uL (ref 150–450)
RBC: 4 x10E6/uL (ref 3.77–5.28)
RDW: 13.1 % (ref 11.7–15.4)
WBC: 8.8 10*3/uL (ref 3.4–10.8)

## 2018-11-10 LAB — MICROALBUMIN / CREATININE URINE RATIO
Creatinine, Urine: 156.7 mg/dL
Microalb/Creat Ratio: 446 mg/g creat — ABNORMAL HIGH (ref 0–29)
Microalbumin, Urine: 698.1 ug/mL

## 2018-11-10 LAB — TSH: TSH: 1.55 u[IU]/mL (ref 0.450–4.500)

## 2018-11-10 MED ORDER — BEVACIZUMAB CHEMO INJECTION 1.25MG/0.05ML SYRINGE FOR KALEIDOSCOPE
1.2500 mg | INTRAVITREAL | Status: DC
  Administered 2018-11-10: 1.25 mg via INTRAVITREAL

## 2018-11-15 ENCOUNTER — Ambulatory Visit (INDEPENDENT_AMBULATORY_CARE_PROVIDER_SITE_OTHER): Payer: Medicare Other | Admitting: *Deleted

## 2018-11-15 DIAGNOSIS — M810 Age-related osteoporosis without current pathological fracture: Secondary | ICD-10-CM

## 2018-11-15 DIAGNOSIS — M8000XD Age-related osteoporosis with current pathological fracture, unspecified site, subsequent encounter for fracture with routine healing: Secondary | ICD-10-CM

## 2018-11-15 MED ORDER — DENOSUMAB 60 MG/ML ~~LOC~~ SOSY: 60.0000 mg | PREFILLED_SYRINGE | SUBCUTANEOUS | 0 refills | Status: DC

## 2018-11-15 MED ORDER — DENOSUMAB 60 MG/ML ~~LOC~~ SOSY
60.0000 mg | PREFILLED_SYRINGE | SUBCUTANEOUS | Status: DC
  Administered 2018-11-15 – 2019-05-17 (×2): 60 mg via SUBCUTANEOUS

## 2018-11-15 NOTE — Progress Notes (Signed)
Pt given 1st Prolia inj Tolerated well Pt supplied Cost around $380 Last Dexa 02/2018 Will try to buy and bill next inj

## 2018-12-07 NOTE — Progress Notes (Signed)
[facility] Note  12/08/2018     CHIEF COMPLAINT Patient presents for Retina Follow Up   HISTORY OF PRESENT ILLNESS: Meredith Shaw is a 73 y.o. female who presents to the clinic today for:   HPI    Retina Follow Up    Patient presents with  CRVO/BRVO.  In right eye.  Since onset it is stable.  I, the attending physician,  performed the HPI with the patient and updated documentation appropriately.          Comments    F/U CRVO OD. Patient states her vision is about the same, denies new visual issues/onsets. Pt is ready for tx today if indicted       Last edited by Bernarda Caffey, MD on 12/08/2018  8:48 AM. (History)    pt states she has not noticed a change in her vision  Referring physician: Baruch Gouty, [place], [place] [id number]  HISTORICAL INFORMATION:   Selected notes from the MEDICAL RECORD NUMBER Referred by Dr. Maryjane Hurter for DM exam LEE: 01.16.20 (M. Le) [BCVA: OD: 20/40 OS: 20/50-3] Ocular Hx-PDR OU, macular edema OU, cataracts OU PMH-DM (takes metformin), arthritis, HTN, hypothyroidism    CURRENT MEDICATIONS: No current outpatient medications on file. (Ophthalmic Drugs)   No current facility-administered medications for this visit.  (Ophthalmic Drugs)   Current Outpatient Medications (Other)  Medication Sig  . amLODipine (NORVASC) 5 MG tablet Take 1 tablet (5 mg total) by mouth daily.  . Blood Glucose Monitoring Suppl (ONETOUCH VERIO) w/Device KIT 1 each by Does not apply route daily.  . cholecalciferol (VITAMIN D) 1000 units tablet Take 5,000 Units by mouth daily.   Marland Kitchen denosumab (PROLIA) 60 MG/ML SOSY injection Inject 60 mg into the skin every 6 (six) months.  Marland Kitchen glucose blood (ONETOUCH VERIO) test strip Use as instructed  . HYDROcodone-acetaminophen (NORCO) 5-325 MG tablet Take 1-2 tablets by mouth every 6 (six) hours as needed for moderate pain. One to two tabs every 4-6 hours for pain  . Lancets (ONETOUCH  ULTRASOFT) lancets Use as instructed  . levothyroxine (SYNTHROID, LEVOTHROID) 137 MCG tablet TAKE ONE (1) TABLET EACH DAY (Patient taking differently: Take 137 mcg by mouth daily before breakfast. )  . lisinopril (PRINIVIL,ZESTRIL) 10 MG tablet Take 2 tablets (20 mg total) by mouth daily for 30 days.  . metFORMIN (GLUCOPHAGE-XR) 500 MG 24 hr tablet Take 1 tablet (500 mg total) by mouth 2 (two) times daily.  . metoprolol tartrate (LOPRESSOR) 100 MG tablet Take 1 tablet (100 mg total) by mouth 2 (two) times daily.  . naproxen sodium (ALEVE) 220 MG tablet Take 220 mg by mouth 2 (two) times daily as needed (for pain).  . pravastatin (PRAVACHOL) 40 MG tablet Take 1 tablet (40 mg total) by mouth at bedtime.   Current Facility-Administered Medications (Other)  Medication Route  . Bevacizumab (AVASTIN) SOLN 1.25 mg Intravitreal  . Bevacizumab (AVASTIN) SOLN 1.25 mg Intravitreal  . denosumab (PROLIA) injection 60 mg Subcutaneous      REVIEW OF SYSTEMS: ROS    Positive for: Eyes   Negative for: Constitutional, Gastrointestinal, Neurological, Skin, Genitourinary, Musculoskeletal, HENT, Endocrine, Cardiovascular, Respiratory, Psychiatric, Allergic/Imm, Heme/Lymph   Last edited by Zenovia Jordan, LPN on 06/14/91  3:30 AM. (History)       ALLERGIES Allergies  Allergen Reactions  . Penicillins Itching and Rash    PAST MEDICAL HISTORY Past Medical History:  Diagnosis Date  . Arthritis  OA AND PAIN BOTH KNEES -RIGHT KNEE HURTS WORSE THAN LEFT  . Cataract   . Diabetes mellitus    ORAL MEDICATION - NO INSULIN  . Hypertension   . Hypothyroidism   . Kidney stones    NONE AT PRESENT TIME THAT PT IS AWARE OF  . Osteoporosis    Past Surgical History:  Procedure Laterality Date  . ABDOMINAL HYSTERECTOMY    . CATARACT EXTRACTION    . CHOLECYSTECTOMY  1972  . COLONOSCOPY    . EYE SURGERY     RIGHT CATARACT EXTRACTON WITH LENS IMPLANT  . GROWTH REMOVED FROM THYROID    . INTRAMEDULLARY  (IM) NAIL INTERTROCHANTERIC Left 02/10/2018   Procedure: INTRAMEDULLARY (IM) NAIL INTERTROCHANTRIC;  Surgeon: Mcarthur Rossetti, MD;  Location: [facility];  Service: Orthopedics;  Laterality: Left;  . INTRAMEDULLARY (IM) NAIL INTERTROCHANTERIC Right 08/20/2018   Procedure: ORIF RIGHT HIP INTERTROC FRACTURE;  Surgeon: Mcarthur Rossetti, MD;  Location: [facility];  Service: Orthopedics;  Laterality: Right;  . left hip surgery   02/2018  . ORIF HIP FRACTURE Right 08/20/2018  . PERCUTANEOUS NEPHROLITHOTOMY    . TOTAL KNEE ARTHROPLASTY  06/09/2012   Procedure: TOTAL KNEE ARTHROPLASTY;  Surgeon: Mcarthur Rossetti, MD;  Location: [facility];  Service: Orthopedics;  Laterality: Right;  Right Total Knee Arthroplasty  . TOTAL KNEE ARTHROPLASTY Left 04/09/2014   Procedure: LEFT TOTAL KNEE ARTHROPLASTY;  Surgeon: Mcarthur Rossetti, MD;  Location: [facility];  Service: Orthopedics;  Laterality: Left;  . TUBAL LIGATION      FAMILY HISTORY Family History  Problem Relation Age of Onset  . Heart disease Mother   . Breast cancer Mother        79's  . Stroke Father   . Heart disease Father   . Healthy Daughter   . Healthy Son   . Liver disease Maternal Grandmother        gall stones imbedded in liver - blead to death when in surgery   . Pneumonia Maternal Grandfather   . Diabetes Paternal Grandmother     SOCIAL HISTORY Social History   Tobacco Use  . Smoking status: Never Smoker  . Smokeless tobacco: Never Used  Substance Use Topics  . Alcohol use: No  . Drug use: No         OPHTHALMIC EXAM:  Base Eye Exam    Visual Acuity (Snellen - Linear)      Right Left   Dist cc 20/30 +1 20/80 -1   Dist ph cc NI 20/60   Correction:  Glasses       Tonometry (Tonopen, 8:36 AM)      Right Left   Pressure 14 17       Pupils      Dark Light Shape React APD   Right 3 2 Round Brisk None   Left 3 2 Round Brisk None       Visual Fields (Counting fingers)      Left Right    Full Full        Extraocular Movement      Right Left    Full, Ortho Full, Ortho       Neuro/Psych    Oriented x3:  Yes       Dilation    Both eyes:  1.0% Mydriacyl, 2.5% Phenylephrine @ 8:35 AM        Slit Lamp and Fundus Exam    Slit Lamp Exam      Right Left  Lids/Lashes Dermatochalasis - upper lid, Meibomian gland dysfunction Dermatochalasis - upper lid, Meibomian gland dysfunction   Conjunctiva/Sclera White and quiet White and quiet   Cornea 1+ Punctate epithelial erosions, Debris in tear film 1+ Punctate epithelial erosions, Debris in tear film   Anterior Chamber deep and clear deep and clear, narrow temporal angle   Iris Round and dilated, No NVI Round and dilated, No NVI   Lens PC IOL in good position with open PC, trace Posterior capsular opacification 3+ Nuclear sclerosis, 3+ Cortical cataract, 3+ central Posterior subcapsular cataract   Vitreous mild Vitreous syneresis mild Vitreous syneresis       Fundus Exam      Right Left   Disc mild tilt, temporal PPA Pink and Sharp, temporal Peripapillary atrophy   C/D Ratio 0.5 0.2   Macula Blunted foveal reflex, interval improvement in  IRH and edema  Blunted foveal reflex, Retinal pigment epithelial mottling, No heme or edema   Vessels Vascular attenuation, Tortuous, AV crossing changes Vascular attenuation, Tortuous, AV crossing changes   Periphery Attached, scattered reticular degeneration, few IRH temporally Attached, reticular degeneration          IMAGING AND PROCEDURES  Imaging and Procedures for @TODAY @  OCT, Retina - OU - Both Eyes       Right Eye Quality was good. Central Foveal Thickness: 367. Progression has improved. Findings include abnormal foveal contour, no SRF, intraretinal fluid, cystoid macular edema (Interval improvement in IRF).   Left Eye Central Foveal Thickness: 276. Progression has been stable. Findings include normal foveal contour, no IRF, no SRF.   Notes *Images captured and stored on  drive  Diagnosis / Impression:  OD: superior BRVO w/ CME -- interval improvement in IRF OS: NFP; no IRF/SRF  Clinical management:  See below  Abbreviations: NFP - Normal foveal profile. CME - cystoid macular edema. PED - pigment epithelial detachment. IRF - intraretinal fluid. SRF - subretinal fluid. EZ - ellipsoid zone. ERM - epiretinal membrane. ORA - outer retinal atrophy. ORT - outer retinal tubulation. SRHM - subretinal hyper-reflective material        Intravitreal Injection, Pharmacologic Agent - OD - Right Eye       Time Out 12/08/2018. 9:15 AM. Confirmed correct patient, procedure, site, and patient consented.   Anesthesia Topical anesthesia was used. Anesthetic medications included Lidocaine 2%, Proparacaine 0.5%.   Procedure Preparation included 5% betadine to ocular surface, eyelid speculum. A supplied needle was used.   Injection:  1.25 mg Bevacizumab (AVASTIN) SOLN   NDC: 10932-355-73, Lot: [id number] , Expiration date: 01/31/2019   Route: Intravitreal, Site: Right Eye, Waste: 0 mL  Post-op Post injection exam found visual acuity of at least counting fingers. The patient tolerated the procedure well. There were no complications. The patient received written and verbal post procedure care education.                 ASSESSMENT/PLAN:    ICD-10-CM   1. Branch retinal vein occlusion of right eye with macular edema H34.8310 Intravitreal Injection, Pharmacologic Agent - OD - Right Eye    Bevacizumab (AVASTIN) SOLN 1.25 mg  2. Retinal edema H35.81 OCT, Retina - OU - Both Eyes  3. Essential hypertension I10   4. Hypertensive retinopathy of both eyes H35.033   5. Combined forms of age-related cataract of left eye H25.812   6. Pseudophakia Z96.1     1,2. BRVO w/ CME OD  - s/p IVA OD #1 (01.31.20)  - BCVA improved to 20/30+1   -  OCT shows interval improvement in CME superior macula extending to fovea  - recommend IVA OD #2 today, 02.28.20  - RBA of procedure  discussed, questions answered  - informed consent obtained and signed  - see procedure note  - F/U 4 weeks -- DFE/OCT/possible injection  3,4. Hypertensive retinopathy OU  - discussed importance of tight BP control  - monitor  5. Mixed form age related cataract OS  - The symptoms of cataract, surgical options, and treatments and risks were discussed with patient.  - discussed diagnosis and progression  - visually significant central PSC  - would likely benefit from cataract extraction and IOL  - pt wishes to go to ophthalmologist that son went to (Dr. Nancy Fetter with [facility])  6. Pseudophakia OD  - s/p CE/IOL OD  - beautiful surgery, doing well  - done at [facility] around 2010  - monitor   Ophthalmic Meds Ordered this visit:  Meds ordered this encounter  Medications  . Bevacizumab (AVASTIN) SOLN 1.25 mg       Return in about 4 weeks (around 01/05/2019) for f/u BRVO OD, DFE, OCT.  There are no Patient Instructions on file for this visit.   Explained the diagnoses, plan, and follow up with the patient and they expressed understanding.  Patient expressed understanding of the importance of proper follow up care.   This document serves as a record of services personally performed by Gardiner Sleeper, MD, PhD. It was created on their behalf by Ernest Mallick, OA, an ophthalmic assistant. The creation of this record is the provider's dictation and/or activities during the visit.    Electronically signed by: Ernest Mallick, OA  02.27.2020 11:53 AM     Gardiner Sleeper, M.D., Ph.D. Diseases & Surgery of the Retina and Vitreous [facility]  I have reviewed the above documentation for accuracy and completeness, and I agree with the above. Gardiner Sleeper, M.D., Ph.D. 12/08/18 11:53 AM     Abbreviations: M myopia (nearsighted); A astigmatism; H hyperopia (farsighted); P presbyopia; Mrx spectacle prescription;  CTL contact lenses; OD right eye; OS left eye;  OU both eyes  XT exotropia; ET esotropia; PEK punctate epithelial keratitis; PEE punctate epithelial erosions; DES dry eye syndrome; MGD meibomian gland dysfunction; ATs artificial tears; PFAT's preservative free artificial tears; [place] nuclear sclerotic cataract; PSC posterior subcapsular cataract; ERM epi-retinal membrane; PVD posterior vitreous detachment; RD retinal detachment; DM diabetes mellitus; DR diabetic retinopathy; NPDR non-proliferative diabetic retinopathy; PDR proliferative diabetic retinopathy; CSME clinically significant macular edema; DME diabetic macular edema; dbh dot blot hemorrhages; CWS cotton wool spot; POAG primary open angle glaucoma; C/D cup-to-disc ratio; HVF humphrey visual field; GVF goldmann visual field; OCT optical coherence tomography; IOP intraocular pressure; BRVO Branch retinal vein occlusion; CRVO central retinal vein occlusion; CRAO central retinal artery occlusion; BRAO branch retinal artery occlusion; RT retinal tear; SB scleral buckle; PPV pars plana vitrectomy; VH Vitreous hemorrhage; PRP panretinal laser photocoagulation; IVK intravitreal kenalog; VMT vitreomacular traction; MH Macular hole;  NVD neovascularization of the disc; NVE neovascularization elsewhere; AREDS age related eye disease study; ARMD age related macular degeneration; POAG primary open angle glaucoma; EBMD epithelial/anterior basement membrane dystrophy; ACIOL anterior chamber intraocular lens; IOL intraocular lens; PCIOL posterior chamber intraocular lens; Phaco/IOL phacoemulsification with intraocular lens placement; [place] photorefractive keratectomy; LASIK laser assisted in situ keratomileusis; HTN hypertension; DM diabetes mellitus; COPD chronic obstructive pulmonary disease

## 2018-12-08 ENCOUNTER — Encounter (INDEPENDENT_AMBULATORY_CARE_PROVIDER_SITE_OTHER): Payer: Self-pay | Admitting: Ophthalmology

## 2018-12-08 ENCOUNTER — Ambulatory Visit (INDEPENDENT_AMBULATORY_CARE_PROVIDER_SITE_OTHER): Payer: Medicare Other | Admitting: Ophthalmology

## 2018-12-08 DIAGNOSIS — H34831 Tributary (branch) retinal vein occlusion, right eye, with macular edema: Secondary | ICD-10-CM | POA: Diagnosis not present

## 2018-12-08 DIAGNOSIS — H25812 Combined forms of age-related cataract, left eye: Secondary | ICD-10-CM

## 2018-12-08 DIAGNOSIS — I10 Essential (primary) hypertension: Secondary | ICD-10-CM

## 2018-12-08 DIAGNOSIS — H3581 Retinal edema: Secondary | ICD-10-CM | POA: Diagnosis not present

## 2018-12-08 DIAGNOSIS — H35033 Hypertensive retinopathy, bilateral: Secondary | ICD-10-CM

## 2018-12-08 DIAGNOSIS — Z961 Presence of intraocular lens: Secondary | ICD-10-CM

## 2018-12-08 MED ORDER — BEVACIZUMAB CHEMO INJECTION 1.25MG/0.05ML SYRINGE FOR KALEIDOSCOPE
1.2500 mg | INTRAVITREAL | Status: DC
  Administered 2018-12-08: 1.25 mg via INTRAVITREAL

## 2018-12-28 ENCOUNTER — Other Ambulatory Visit: Payer: Self-pay | Admitting: Family

## 2019-01-05 ENCOUNTER — Encounter (INDEPENDENT_AMBULATORY_CARE_PROVIDER_SITE_OTHER): Payer: Medicare Other | Admitting: Ophthalmology

## 2019-01-22 LAB — COLOGUARD: Cologuard: NEGATIVE

## 2019-02-12 ENCOUNTER — Ambulatory Visit (INDEPENDENT_AMBULATORY_CARE_PROVIDER_SITE_OTHER): Payer: Medicare Other | Admitting: Family Medicine

## 2019-02-12 ENCOUNTER — Encounter: Payer: Self-pay | Admitting: Family Medicine

## 2019-02-12 ENCOUNTER — Other Ambulatory Visit: Payer: Self-pay

## 2019-02-12 DIAGNOSIS — E113511 Type 2 diabetes mellitus with proliferative diabetic retinopathy with macular edema, right eye: Secondary | ICD-10-CM

## 2019-02-12 DIAGNOSIS — E1122 Type 2 diabetes mellitus with diabetic chronic kidney disease: Secondary | ICD-10-CM

## 2019-02-12 DIAGNOSIS — I10 Essential (primary) hypertension: Secondary | ICD-10-CM | POA: Diagnosis not present

## 2019-02-12 DIAGNOSIS — E1159 Type 2 diabetes mellitus with other circulatory complications: Secondary | ICD-10-CM

## 2019-02-12 DIAGNOSIS — I152 Hypertension secondary to endocrine disorders: Secondary | ICD-10-CM

## 2019-02-12 DIAGNOSIS — N183 Chronic kidney disease, stage 3 (moderate): Secondary | ICD-10-CM

## 2019-02-12 NOTE — Progress Notes (Signed)
Virtual Visit via telephone Note Due to COVID-19, visit is conducted virtually and was requested by patient. This visit type was conducted due to national recommendations for restrictions regarding the COVID-19 Pandemic (e.g. social distancing) in an effort to limit this patient's exposure and mitigate transmission in our community. All issues noted in this document were discussed and addressed.  A physical exam was not performed with this format.   I connected with Meredith Shaw on 02/12/19 at 44 by telephone and verified that I am speaking with the correct person using two identifiers. Meredith Shaw is currently located at home and family is currently with them during visit. The provider, Monia Pouch, FNP is located in their office at time of visit.  I discussed the limitations, risks, security and privacy concerns of performing an evaluation and management service by telephone and the availability of in person appointments. I also discussed with the patient that there may be a patient responsible charge related to this service. The patient expressed understanding and agreed to proceed.  Subjective:  Patient ID: Meredith Shaw, female    DOB: 1946/01/14, 73 y.o.   MRN: [id number]  Chief Complaint:  Medical Management of Chronic Issues; Hypertension; Diabetes; Thyroid Problem; and Osteoporosis   HPI: Meredith Shaw is a 73 y.o. female presenting on 02/12/2019 for Medical Management of Chronic Issues; Hypertension; Diabetes; Thyroid Problem; and Osteoporosis   States she is doing very well. She has recovered well from her recent hip fracture. States she is ambulating without difficulties. She did start her Prolia injections and is due to her next injection in August. BS running around 115-120. BP running around 125/80.   Hypertension  This is a chronic problem. The current episode started more than 1 year ago. The problem is unchanged. The problem is controlled. Pertinent negatives  include no anxiety, blurred vision, chest pain, headaches, malaise/fatigue, neck pain, orthopnea, palpitations, peripheral edema, PND or shortness of breath. Risk factors for coronary artery disease include diabetes mellitus and dyslipidemia. Past treatments include ACE inhibitors, calcium channel blockers, beta blockers and lifestyle changes. The current treatment provides significant improvement. There are no compliance problems.  Hypertensive end-organ damage includes kidney disease and retinopathy. Identifiable causes of hypertension include a thyroid problem.  Diabetes  She presents for her follow-up diabetic visit. She has type 2 diabetes mellitus. Her disease course has been stable. There are no hypoglycemic associated symptoms. Pertinent negatives for hypoglycemia include no confusion, dizziness, headaches, nervousness/anxiousness, seizures, speech difficulty or tremors. Pertinent negatives for diabetes include no blurred vision, no chest pain, no fatigue, no foot paresthesias, no foot ulcerations, no polydipsia, no polyphagia, no polyuria, no weakness and no weight loss. There are no hypoglycemic complications. Diabetic complications include retinopathy. Risk factors for coronary artery disease include diabetes mellitus, dyslipidemia and hypertension. Current diabetic treatment includes oral agent (monotherapy). She is compliant with treatment all of the time. Her weight is stable. She is following a generally healthy diet. When asked about meal planning, she reported none. There is no change in her home blood glucose trend. Her breakfast blood glucose range is generally 110-130 mg/dl. Her bedtime blood glucose range is generally 110-130 mg/dl. An ACE inhibitor/angiotensin II receptor blocker is being taken. She sees a podiatrist.Eye exam is current.  Thyroid Problem  Presents for follow-up visit. Patient reports no anxiety, cold intolerance, constipation, depressed mood, diaphoresis, diarrhea, dry  skin, fatigue, hair loss, heat intolerance, hoarse voice, leg swelling, menstrual problem, nail problem, palpitations, tremors, weight gain  or weight loss. The symptoms have been stable.     Relevant past medical, surgical, family, and social history reviewed and updated as indicated.  Allergies and medications reviewed and updated.   Past Medical History:  Diagnosis Date  . Arthritis    OA AND PAIN BOTH KNEES -RIGHT KNEE HURTS WORSE THAN LEFT  . Cataract   . Diabetes mellitus    ORAL MEDICATION - NO INSULIN  . Hypertension   . Hypothyroidism   . Kidney stones    NONE AT PRESENT TIME THAT PT IS AWARE OF  . Osteoporosis     Past Surgical History:  Procedure Laterality Date  . ABDOMINAL HYSTERECTOMY    . CATARACT EXTRACTION    . CHOLECYSTECTOMY  1972  . COLONOSCOPY    . EYE SURGERY     RIGHT CATARACT EXTRACTON WITH LENS IMPLANT  . GROWTH REMOVED FROM THYROID    . INTRAMEDULLARY (IM) NAIL INTERTROCHANTERIC Left 02/10/2018   Procedure: INTRAMEDULLARY (IM) NAIL INTERTROCHANTRIC;  Surgeon: Mcarthur Rossetti, MD;  Location: [facility];  Service: Orthopedics;  Laterality: Left;  . INTRAMEDULLARY (IM) NAIL INTERTROCHANTERIC Right 08/20/2018   Procedure: ORIF RIGHT HIP INTERTROC FRACTURE;  Surgeon: Mcarthur Rossetti, MD;  Location: [facility];  Service: Orthopedics;  Laterality: Right;  . left hip surgery   02/2018  . ORIF HIP FRACTURE Right 08/20/2018  . PERCUTANEOUS NEPHROLITHOTOMY    . TOTAL KNEE ARTHROPLASTY  06/09/2012   Procedure: TOTAL KNEE ARTHROPLASTY;  Surgeon: Mcarthur Rossetti, MD;  Location: [facility];  Service: Orthopedics;  Laterality: Right;  Right Total Knee Arthroplasty  . TOTAL KNEE ARTHROPLASTY Left 04/09/2014   Procedure: LEFT TOTAL KNEE ARTHROPLASTY;  Surgeon: Mcarthur Rossetti, MD;  Location: [facility];  Service: Orthopedics;  Laterality: Left;  . TUBAL LIGATION      Social History   Socioeconomic History  . Marital status: Married    Spouse name: Berneta Sages    . Number of children: 2  . Years of education: Not on file  . Highest education level: Not on file  Occupational History  . Occupation: retired     Fish farm manager: [employer]    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    Comment: bank - 57yr  Social Needs  . Financial resource strain: Not on file  . Food insecurity:    Worry: Not on file    Inability: Not on file  . Transportation needs:    Medical: Not on file    Non-medical: Not on file  Tobacco Use  . Smoking status: Never Smoker  . Smokeless tobacco: Never Used  Substance and Sexual Activity  . Alcohol use: No  . Drug use: No  . Sexual activity: Yes  Lifestyle  . Physical activity:    Days per week: Not on file    Minutes per session: Not on file  . Stress: Not on file  Relationships  . Social connections:    Talks on phone: Not on file    Gets together: Not on file    Attends religious service: Not on file    Active member of club or organization: Not on file    Attends meetings of clubs or organizations: Not on file    Relationship status: Not on file  . Intimate partner violence:    Fear of current or ex partner: Not on file    Emotionally abused: Not on file    Physically abused: Not on file    Forced sexual activity: Not on file  Other  Topics Concern  . Not on file  Social History Narrative  . Not on file    Outpatient Encounter Medications as of 02/12/2019  Medication Sig  . amLODipine (NORVASC) 5 MG tablet Take 1 tablet (5 mg total) by mouth daily.  . Blood Glucose Monitoring Suppl (ONETOUCH VERIO) w/Device KIT 1 each by Does not apply route daily.  . cholecalciferol (VITAMIN D) 1000 units tablet Take 5,000 Units by mouth daily.   Marland Kitchen denosumab (PROLIA) 60 MG/ML SOSY injection Inject 60 mg into the skin every 6 (six) months.  Marland Kitchen glucose blood (ONETOUCH VERIO) test strip Use as instructed  . HYDROcodone-acetaminophen (NORCO) 5-325 MG tablet Take 1-2 tablets by mouth every 6 (six) hours as needed for moderate pain. One to two  tabs every 4-6 hours for pain  . Lancets (ONETOUCH ULTRASOFT) lancets Use as instructed  . levothyroxine (SYNTHROID, LEVOTHROID) 137 MCG tablet TAKE ONE (1) TABLET EACH DAY  . lisinopril (PRINIVIL,ZESTRIL) 10 MG tablet Take 2 tablets (20 mg total) by mouth daily for 30 days.  . metFORMIN (GLUCOPHAGE-XR) 500 MG 24 hr tablet Take 1 tablet (500 mg total) by mouth 2 (two) times daily.  . metoprolol tartrate (LOPRESSOR) 100 MG tablet Take 1 tablet (100 mg total) by mouth 2 (two) times daily.  . naproxen sodium (ALEVE) 220 MG tablet Take 220 mg by mouth 2 (two) times daily as needed (for pain).  . pravastatin (PRAVACHOL) 40 MG tablet Take 1 tablet (40 mg total) by mouth at bedtime.   Facility-Administered Encounter Medications as of 02/12/2019  Medication  . Bevacizumab (AVASTIN) SOLN 1.25 mg  . Bevacizumab (AVASTIN) SOLN 1.25 mg  . denosumab (PROLIA) injection 60 mg    Allergies  Allergen Reactions  . Penicillins Itching and Rash    Review of Systems  Constitutional: Negative for activity change, appetite change, chills, diaphoresis, fatigue, fever, malaise/fatigue, unexpected weight change, weight gain and weight loss.  HENT: Negative for hoarse voice.   Eyes: Negative for blurred vision, photophobia and visual disturbance.  Respiratory: Negative for cough, chest tightness and shortness of breath.   Cardiovascular: Negative for chest pain, palpitations, orthopnea, leg swelling and PND.  Gastrointestinal: Negative for constipation and diarrhea.  Endocrine: Negative for cold intolerance, heat intolerance, polydipsia, polyphagia and polyuria.  Genitourinary: Negative for decreased urine volume, difficulty urinating and menstrual problem.  Musculoskeletal: Negative for arthralgias, back pain, joint swelling, myalgias and neck pain.  Neurological: Negative for dizziness, tremors, seizures, syncope, facial asymmetry, speech difficulty, weakness, light-headedness, numbness and headaches.   Psychiatric/Behavioral: Negative for confusion. The patient is not nervous/anxious.   All other systems reviewed and are negative.        Observations/Objective: No vital signs or physical exam, this was a telephone or virtual health encounter.  Pt alert and oriented, answers all questions appropriately, and able to speak in full sentences.    Assessment and Plan: Meredith Shaw was seen today for medical management of chronic issues, hypertension, diabetes, thyroid problem and osteoporosis.  Diagnoses and all orders for this visit:  Hypertension associated with type 2 diabetes mellitus ([place]) Stable. DASH diet. Exercise. Report any persistent highs or lows. Continue medications as prescribed. Reevaluate in 3 months.  Type 2 diabetes mellitus with right eye affected by proliferative retinopathy and macular edema, without long-term current use of insulin ([facility]) Stable. Compliant with medications. Continue medications as prescribed. Continue to avoid sugary foods and drinks. Report any persistent highs or lows. Will recheck labs in 3 months.   CKD stage 3  due to type 2 diabetes mellitus ([place]) Will check labs in 3 months. Is on ACEI.   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      Follow Up Instructions: Return in about 3 months (around 05/15/2019), or if symptoms worsen or fail to improve, for DM, HTN, CKD.    I discussed the assessment and treatment plan with the patient. The patient was provided an opportunity to ask questions and all were answered. The patient agreed with the plan and demonstrated an understanding of the instructions.   The patient was advised to call back or seek an in-person evaluation if the symptoms worsen or if the condition fails to improve as anticipated.  The above assessment and management plan was discussed with the patient.  The patient verbalized understanding of and has agreed to the management plan. Patient is aware to call the clinic if symptoms persist or worsen. Patient is aware when to return to the clinic for a follow-up visit. Patient educated on when it is appropriate to go to the emergency department.    I provided 25 minutes of non-face-to-face time during this encounter. The call started at 1030. The call ended at 1050. The other time was used for coordination of care.    Monia Pouch, FNP-C [facility] [address] [place], [place] [address] [phone]

## 2019-02-14 NOTE — Progress Notes (Signed)
[facility] Note  02/16/2019     CHIEF COMPLAINT Patient presents for Retina Follow Up   HISTORY OF PRESENT ILLNESS: Meredith Shaw is a 73 y.o. female who presents to the clinic today for:   HPI    Retina Follow Up    Patient presents with  CRVO/BRVO.  In right eye.  This started months ago.  Severity is moderate.  Duration of 10 weeks.  Since onset it is stable.  I, the attending physician,  performed the HPI with the patient and updated documentation appropriately.          Comments    73 y/o female pt here for 10 wk f/u for BRVO w/CME OD.  No change in [place] OU.  Denies pain, flashes, floaters.  No gtts.  Scheduled for cat sx OS w/Dr. Nancy Fetter within the next week or two.       Last edited by Bernarda Caffey, MD on 02/16/2019  8:48 AM. (History)    pt states she is delayed to f/u due to COVID restrictions. she states her vision is about the same since last time, she states Dr. Nancy Fetter is doing cataract sx on Thursday on her left eye  Referring physician: Baruch Gouty, [place], [place] [address]  HISTORICAL INFORMATION:   Selected notes from the MEDICAL RECORD NUMBER Referred by Dr. Maryjane Hurter for DM exam LEE: 01.16.20 (M. Le) [BCVA: OD: 20/40 OS: 20/50-3] Ocular Hx-PDR OU, macular edema OU, cataracts OU PMH-DM (takes metformin), arthritis, HTN, hypothyroidism    CURRENT MEDICATIONS: No current outpatient medications on file. (Ophthalmic Drugs)   No current facility-administered medications for this visit.  (Ophthalmic Drugs)   Current Outpatient Medications (Other)  Medication Sig  . amLODipine (NORVASC) 5 MG tablet Take 1 tablet (5 mg total) by mouth daily.  . Blood Glucose Monitoring Suppl (ONETOUCH VERIO) w/Device KIT 1 each by Does not apply route daily.  . cholecalciferol (VITAMIN D) 1000 units tablet Take 5,000 Units by mouth daily.   Marland Kitchen denosumab (PROLIA) 60 MG/ML SOSY injection Inject 60 mg into the skin every 6 (six) months.  Marland Kitchen  glucose blood (ONETOUCH VERIO) test strip Use as instructed  . HYDROcodone-acetaminophen (NORCO) 5-325 MG tablet Take 1-2 tablets by mouth every 6 (six) hours as needed for moderate pain. One to two tabs every 4-6 hours for pain  . Lancets (ONETOUCH ULTRASOFT) lancets Use as instructed  . levothyroxine (SYNTHROID, LEVOTHROID) 137 MCG tablet TAKE ONE (1) TABLET EACH DAY  . lisinopril (PRINIVIL,ZESTRIL) 10 MG tablet Take 2 tablets (20 mg total) by mouth daily for 30 days.  . metFORMIN (GLUCOPHAGE-XR) 500 MG 24 hr tablet Take 1 tablet (500 mg total) by mouth 2 (two) times daily.  . metoprolol tartrate (LOPRESSOR) 100 MG tablet Take 1 tablet (100 mg total) by mouth 2 (two) times daily.  . naproxen sodium (ALEVE) 220 MG tablet Take 220 mg by mouth 2 (two) times daily as needed (for pain).  . pravastatin (PRAVACHOL) 40 MG tablet Take 1 tablet (40 mg total) by mouth at bedtime.   Current Facility-Administered Medications (Other)  Medication Route  . Bevacizumab (AVASTIN) SOLN 1.25 mg Intravitreal  . Bevacizumab (AVASTIN) SOLN 1.25 mg Intravitreal  . denosumab (PROLIA) injection 60 mg Subcutaneous      REVIEW OF SYSTEMS: ROS    Positive for: Genitourinary, Musculoskeletal, Endocrine, Eyes   Negative for: Constitutional, Gastrointestinal, Neurological, Skin, HENT, Cardiovascular, Respiratory, Psychiatric, Allergic/Imm, Heme/Lymph  Last edited by Matthew Folks, COA on 02/16/2019  8:31 AM. (History)       ALLERGIES Allergies  Allergen Reactions  . Penicillins Itching and Rash    PAST MEDICAL HISTORY Past Medical History:  Diagnosis Date  . Arthritis    OA AND PAIN BOTH KNEES -RIGHT KNEE HURTS WORSE THAN LEFT  . Cataract   . Diabetes mellitus    ORAL MEDICATION - NO INSULIN  . Hypertension   . Hypertensive retinopathy    OU  . Hypothyroidism   . Kidney stones    NONE AT PRESENT TIME THAT PT IS AWARE OF  . Osteoporosis    Past Surgical History:  Procedure Laterality Date  .  ABDOMINAL HYSTERECTOMY    . CATARACT EXTRACTION    . CHOLECYSTECTOMY  1972  . COLONOSCOPY    . EYE SURGERY     RIGHT CATARACT EXTRACTON WITH LENS IMPLANT  . GROWTH REMOVED FROM THYROID    . INTRAMEDULLARY (IM) NAIL INTERTROCHANTERIC Left 02/10/2018   Procedure: INTRAMEDULLARY (IM) NAIL INTERTROCHANTRIC;  Surgeon: Mcarthur Rossetti, MD;  Location: [facility];  Service: Orthopedics;  Laterality: Left;  . INTRAMEDULLARY (IM) NAIL INTERTROCHANTERIC Right 08/20/2018   Procedure: ORIF RIGHT HIP INTERTROC FRACTURE;  Surgeon: Mcarthur Rossetti, MD;  Location: [facility];  Service: Orthopedics;  Laterality: Right;  . left hip surgery   02/2018  . ORIF HIP FRACTURE Right 08/20/2018  . PERCUTANEOUS NEPHROLITHOTOMY    . TOTAL KNEE ARTHROPLASTY  06/09/2012   Procedure: TOTAL KNEE ARTHROPLASTY;  Surgeon: Mcarthur Rossetti, MD;  Location: [facility];  Service: Orthopedics;  Laterality: Right;  Right Total Knee Arthroplasty  . TOTAL KNEE ARTHROPLASTY Left 04/09/2014   Procedure: LEFT TOTAL KNEE ARTHROPLASTY;  Surgeon: Mcarthur Rossetti, MD;  Location: [place];  Service: Orthopedics;  Laterality: Left;  . TUBAL LIGATION      FAMILY HISTORY Family History  Problem Relation Age of Onset  . Heart disease Mother   . Breast cancer Mother        10's  . Stroke Father   . Heart disease Father   . Healthy Daughter   . Healthy Son   . Liver disease Maternal Grandmother        gall stones imbedded in liver - blead to death when in surgery   . Pneumonia Maternal Grandfather   . Diabetes Paternal Grandmother     SOCIAL HISTORY Social History   Tobacco Use  . Smoking status: Never Smoker  . Smokeless tobacco: Never Used  Substance Use Topics  . Alcohol use: No  . Drug use: No         OPHTHALMIC EXAM:  Base Eye Exam    Visual Acuity (Snellen - Linear)      Right Left   Dist cc 20/30 +2 20/60   Dist ph cc NI 20/50   Correction:  Glasses       Tonometry (Tonopen, 8:33 AM)      Right  Left   Pressure 15 17       Pupils      Dark Light Shape React APD   Right 3 2 Round Brisk None   Left 3 2 Round Brisk None       Visual Fields (Counting fingers)      Left Right    Full Full       Extraocular Movement      Right Left    Full, Ortho Full, Ortho  Neuro/Psych    Oriented x3:  Yes   Mood/Affect:  Normal       Dilation    Both eyes:  1.0% Mydriacyl, 2.5% Phenylephrine @ 8:33 AM        Slit Lamp and Fundus Exam    Slit Lamp Exam      Right Left   Lids/Lashes Dermatochalasis - upper lid, Meibomian gland dysfunction Dermatochalasis - upper lid, Meibomian gland dysfunction   Conjunctiva/Sclera White and quiet White and quiet   Cornea 1+ Punctate epithelial erosions, Debris in tear film - improved, +ABMD 1+ Punctate epithelial erosions, Debris in tear film   Anterior Chamber deep and clear deep and clear, narrow temporal angle   Iris Round and dilated, No NVI Round and dilated, No NVI   Lens PC IOL in good position, trace Posterior capsular opacification 3+ Nuclear sclerosis, 3+ Cortical cataract, 3+ central Posterior subcapsular cataract   Vitreous mild Vitreous syneresis mild Vitreous syneresis       Fundus Exam      Right Left   Disc compact, mild tilt, temporal PPA compact, Pink and Sharp, temporal Peripapillary atrophy   C/D Ratio 0.1 0.2   Macula Blunted foveal reflex, persistent edema, improving IRH superior macula  Flat, Blunted foveal reflex, Retinal pigment epithelial mottling, No heme or edema   Vessels Vascular attenuation, Tortuous Vascular attenuation, Tortuous   Periphery Attached, scattered reticular degeneration, few IRH temporally Attached, reticular degeneration, rare MA          IMAGING AND PROCEDURES  Imaging and Procedures for _0 @  OCT, Retina - OU - Both Eyes       Right Eye Quality was good. Central Foveal Thickness: 341. Progression has been stable. Findings include abnormal foveal contour, no SRF, intraretinal  fluid, cystoid macular edema (Persistent IRF).   Left Eye Central Foveal Thickness: 281. Progression has been stable. Findings include normal foveal contour, no IRF, no SRF.   Notes *Images captured and stored on drive  Diagnosis / Impression:  OD: superior BRVO w/ CME -- persistent IRF OS: NFP; no IRF/SRF  Clinical management:  See below  Abbreviations: NFP - Normal foveal profile. CME - cystoid macular edema. PED - pigment epithelial detachment. IRF - intraretinal fluid. SRF - subretinal fluid. EZ - ellipsoid zone. ERM - epiretinal membrane. ORA - outer retinal atrophy. ORT - outer retinal tubulation. SRHM - subretinal hyper-reflective material        Intravitreal Injection, Pharmacologic Agent - OD - Right Eye       Time Out 02/16/2019. 9:01 AM. Confirmed correct patient, procedure, site, and patient consented.   Anesthesia Topical anesthesia was used. Anesthetic medications included Lidocaine 2%, Proparacaine 0.5%.   Procedure Preparation included 5% betadine to ocular surface, eyelid speculum. A supplied needle was used.   Injection:  1.25 mg Bevacizumab (AVASTIN) SOLN   NDC: 76734-193-79, Lot: [id number] , Expiration date: 03/19/2019   Route: Intravitreal, Site: Right Eye, Waste: 0 mg  Post-op Post injection exam found visual acuity of at least counting fingers. The patient tolerated the procedure well. There were no complications. The patient received written and verbal post procedure care education.                 ASSESSMENT/PLAN:    ICD-10-CM   1. Branch retinal vein occlusion of right eye with macular edema H34.8310 Intravitreal Injection, Pharmacologic Agent - OD - Right Eye    Bevacizumab (AVASTIN) SOLN 1.25 mg  2. Retinal edema H35.81 OCT, Retina - OU - Both  Eyes  3. Diabetes mellitus type 2 without retinopathy ([place]) E11.9   4. Essential hypertension I10   5. Hypertensive retinopathy of both eyes H35.033   6. Combined forms of age-related cataract  of left eye H25.812   7. Pseudophakia Z96.1     1,2. BRVO w/ CME OD  - s/p IVA OD #1 (01.31.20), #2 (02.28.20)  - delayed follow up to 10 weeks instead of four due to COVID-19 restrictions  - BCVA relatively stable at 20/30  - OCT shows persistent CME superior macula extending to fovea  - recommend IVA OD #3 today, 05.08.20  - RBA of procedure discussed, questions answered  - informed consent obtained and signed  - see procedure note  - F/U 4 weeks -- DFE/OCT/FA (optos, transit OD)  3. Diabetes mellitus, type 2 without retinopathy  - rare MA noted on exam  - The incidence, risk factors for progression, natural history and treatment options for diabetic retinopathy  were discussed with patient.    - The need for close monitoring of blood glucose, blood pressure, and serum lipids, avoiding cigarette or any type of tobacco, and the need for long term follow up was also discussed with patient.  - will check FA next visit  4,5. Hypertensive retinopathy OU  - discussed importance of tight BP control  - monitor  6. Mixed form age related cataract OS  - The symptoms of cataract, surgical options, and treatments and risks were discussed with patient.  - discussed diagnosis and progression  - visually significant central PSC  - would likely benefit from cataract extraction and IOL  - pt wishes to go to ophthalmologist that son went to (Dr. Nancy Fetter with [facility])  7. Pseudophakia OD  - s/p CE/IOL OD  - beautiful surgery, doing well  - done at [facility] around 2010  - monitor   Ophthalmic Meds Ordered this visit:  Meds ordered this encounter  Medications  . Bevacizumab (AVASTIN) SOLN 1.25 mg       Return in about 4 weeks (around 03/16/2019) for f/u 4 weeks BRVO OD, DFE, OCT, FA (optos, transit OS).  There are no Patient Instructions on file for this visit.   Explained the diagnoses, plan, and follow up with the patient and they expressed understanding.  Patient expressed  understanding of the importance of proper follow up care.   This document serves as a record of services personally performed by Gardiner Sleeper, MD, PhD. It was created on their behalf by Ernest Mallick, OA, an ophthalmic assistant. The creation of this record is the provider's dictation and/or activities during the visit.    Electronically signed by: Ernest Mallick, OA  05.06.2020 9:13 AM    Gardiner Sleeper, M.D., Ph.D. Diseases & Surgery of the Retina and Vitreous [facility]  I have reviewed the above documentation for accuracy and completeness, and I agree with the above. Gardiner Sleeper, M.D., Ph.D. 02/16/19 9:19 AM    Abbreviations: M myopia (nearsighted); A astigmatism; H hyperopia (farsighted); P presbyopia; Mrx spectacle prescription;  CTL contact lenses; OD right eye; OS left eye; OU both eyes  XT exotropia; ET esotropia; PEK punctate epithelial keratitis; PEE punctate epithelial erosions; DES dry eye syndrome; MGD meibomian gland dysfunction; ATs artificial tears; PFAT's preservative free artificial tears; [place] nuclear sclerotic cataract; PSC posterior subcapsular cataract; ERM epi-retinal membrane; PVD posterior vitreous detachment; RD retinal detachment; DM diabetes mellitus; DR diabetic retinopathy; NPDR non-proliferative diabetic retinopathy; PDR proliferative diabetic retinopathy;  CSME clinically significant macular edema; DME diabetic macular edema; dbh dot blot hemorrhages; CWS cotton wool spot; POAG primary open angle glaucoma; C/D cup-to-disc ratio; HVF humphrey visual field; GVF goldmann visual field; OCT optical coherence tomography; IOP intraocular pressure; BRVO Branch retinal vein occlusion; CRVO central retinal vein occlusion; CRAO central retinal artery occlusion; BRAO branch retinal artery occlusion; RT retinal tear; SB scleral buckle; PPV pars plana vitrectomy; VH Vitreous hemorrhage; PRP panretinal laser photocoagulation; IVK intravitreal kenalog; VMT  vitreomacular traction; MH Macular hole;  NVD neovascularization of the disc; NVE neovascularization elsewhere; AREDS age related eye disease study; ARMD age related macular degeneration; POAG primary open angle glaucoma; EBMD epithelial/anterior basement membrane dystrophy; ACIOL anterior chamber intraocular lens; IOL intraocular lens; PCIOL posterior chamber intraocular lens; Phaco/IOL phacoemulsification with intraocular lens placement; [place] photorefractive keratectomy; LASIK laser assisted in situ keratomileusis; HTN hypertension; DM diabetes mellitus; COPD chronic obstructive pulmonary disease

## 2019-02-16 ENCOUNTER — Encounter (INDEPENDENT_AMBULATORY_CARE_PROVIDER_SITE_OTHER): Payer: Self-pay | Admitting: Ophthalmology

## 2019-02-16 ENCOUNTER — Ambulatory Visit (INDEPENDENT_AMBULATORY_CARE_PROVIDER_SITE_OTHER): Payer: Medicare Other | Admitting: Ophthalmology

## 2019-02-16 ENCOUNTER — Other Ambulatory Visit: Payer: Self-pay

## 2019-02-16 DIAGNOSIS — H34831 Tributary (branch) retinal vein occlusion, right eye, with macular edema: Secondary | ICD-10-CM

## 2019-02-16 DIAGNOSIS — E119 Type 2 diabetes mellitus without complications: Secondary | ICD-10-CM | POA: Diagnosis not present

## 2019-02-16 DIAGNOSIS — Z961 Presence of intraocular lens: Secondary | ICD-10-CM

## 2019-02-16 DIAGNOSIS — H25812 Combined forms of age-related cataract, left eye: Secondary | ICD-10-CM

## 2019-02-16 DIAGNOSIS — I10 Essential (primary) hypertension: Secondary | ICD-10-CM

## 2019-02-16 DIAGNOSIS — H3581 Retinal edema: Secondary | ICD-10-CM

## 2019-02-16 DIAGNOSIS — H35033 Hypertensive retinopathy, bilateral: Secondary | ICD-10-CM

## 2019-02-16 MED ORDER — BEVACIZUMAB CHEMO INJECTION 1.25MG/0.05ML SYRINGE FOR KALEIDOSCOPE
1.2500 mg | INTRAVITREAL | Status: AC | PRN
  Administered 2019-02-16: 1.25 mg via INTRAVITREAL

## 2019-02-20 ENCOUNTER — Other Ambulatory Visit: Payer: Self-pay | Admitting: *Deleted

## 2019-03-14 NOTE — Progress Notes (Signed)
[facility] Note  03/16/2019     CHIEF COMPLAINT Patient presents for Retina Follow Up   HISTORY OF PRESENT ILLNESS: Meredith Shaw is a 73 y.o. female who presents to the clinic today for:   HPI    Retina Follow Up    Patient presents with  CRVO/BRVO.  In right eye.  Severity is moderate.  Duration of 4 weeks.  Since onset it is stable.  I, the attending physician,  performed the HPI with the patient and updated documentation appropriately.          Comments    4 week retinal eval-Patient states vision about the same OD. Had CE with IOL OS with Dr. Nancy Fetter about 3 weeks ago. Vision improved OS. On white top drop once daily left eye for s/p cataract surgery. Doesn't remember name of gtt.       Last edited by Bernarda Caffey, MD on 03/16/2019 10:05 AM. (History)    pt states she had cataract sx in the left eye with Dr. Nancy Fetter 3 weeks ago, she states she is pleased with the outcome, she is on one drop one time a day  Referring physician: Baruch Gouty, [place], [place] [id number]  HISTORICAL INFORMATION:   Selected notes from the MEDICAL RECORD NUMBER Referred by Dr. Maryjane Hurter for DM exam LEE: 01.16.20 (M. Le) [BCVA: OD: 20/40 OS: 20/50-3] Ocular Hx-PDR OU, macular edema OU, cataracts OU PMH-DM (takes metformin), arthritis, HTN, hypothyroidism    CURRENT MEDICATIONS: No current outpatient medications on file. (Ophthalmic Drugs)   No current facility-administered medications for this visit.  (Ophthalmic Drugs)   Current Outpatient Medications (Other)  Medication Sig  . amLODipine (NORVASC) 5 MG tablet Take 1 tablet (5 mg total) by mouth daily.  . Blood Glucose Monitoring Suppl (ONETOUCH VERIO) w/Device KIT 1 each by Does not apply route daily.  . cholecalciferol (VITAMIN D) 1000 units tablet Take 5,000 Units by mouth daily.   Marland Kitchen denosumab (PROLIA) 60 MG/ML SOSY injection Inject 60 mg into the skin every 6 (six) months.  Marland Kitchen glucose blood  (ONETOUCH VERIO) test strip Use as instructed  . Lancets (ONETOUCH ULTRASOFT) lancets Use as instructed  . levothyroxine (SYNTHROID, LEVOTHROID) 137 MCG tablet TAKE ONE (1) TABLET EACH DAY  . metFORMIN (GLUCOPHAGE-XR) 500 MG 24 hr tablet Take 1 tablet (500 mg total) by mouth 2 (two) times daily.  . metoprolol tartrate (LOPRESSOR) 100 MG tablet Take 1 tablet (100 mg total) by mouth 2 (two) times daily.  . naproxen sodium (ALEVE) 220 MG tablet Take 220 mg by mouth 2 (two) times daily as needed (for pain).  . pravastatin (PRAVACHOL) 40 MG tablet Take 1 tablet (40 mg total) by mouth at bedtime.  Marland Kitchen lisinopril (PRINIVIL,ZESTRIL) 10 MG tablet Take 2 tablets (20 mg total) by mouth daily for 30 days.   Current Facility-Administered Medications (Other)  Medication Route  . Bevacizumab (AVASTIN) SOLN 1.25 mg Intravitreal  . Bevacizumab (AVASTIN) SOLN 1.25 mg Intravitreal  . denosumab (PROLIA) injection 60 mg Subcutaneous      REVIEW OF SYSTEMS: ROS    Positive for: Genitourinary, Musculoskeletal, Endocrine, Eyes   Negative for: Constitutional, Gastrointestinal, Neurological, Skin, HENT, Cardiovascular, Respiratory, Psychiatric, Allergic/Imm, Heme/Lymph   Last edited by Roselee Nova D on 03/16/2019  8:52 AM. (History)       ALLERGIES Allergies  Allergen Reactions  . Penicillins Itching and Rash    PAST MEDICAL HISTORY Past Medical History:  Diagnosis Date  . Arthritis    OA AND PAIN BOTH KNEES -RIGHT KNEE HURTS WORSE THAN LEFT  . Cataract   . Diabetes mellitus    ORAL MEDICATION - NO INSULIN  . Hypertension   . Hypertensive retinopathy    OU  . Hypothyroidism   . Kidney stones    NONE AT PRESENT TIME THAT PT IS AWARE OF  . Osteoporosis    Past Surgical History:  Procedure Laterality Date  . ABDOMINAL HYSTERECTOMY    . CATARACT EXTRACTION    . CHOLECYSTECTOMY  1972  . COLONOSCOPY    . EYE SURGERY     RIGHT CATARACT EXTRACTON WITH LENS IMPLANT  . GROWTH REMOVED FROM THYROID     . INTRAMEDULLARY (IM) NAIL INTERTROCHANTERIC Left 02/10/2018   Procedure: INTRAMEDULLARY (IM) NAIL INTERTROCHANTRIC;  Surgeon: Mcarthur Rossetti, MD;  Location: [place];  Service: Orthopedics;  Laterality: Left;  . INTRAMEDULLARY (IM) NAIL INTERTROCHANTERIC Right 08/20/2018   Procedure: ORIF RIGHT HIP INTERTROC FRACTURE;  Surgeon: Mcarthur Rossetti, MD;  Location: [place];  Service: Orthopedics;  Laterality: Right;  . left hip surgery   02/2018  . ORIF HIP FRACTURE Right 08/20/2018  . PERCUTANEOUS NEPHROLITHOTOMY    . TOTAL KNEE ARTHROPLASTY  06/09/2012   Procedure: TOTAL KNEE ARTHROPLASTY;  Surgeon: Mcarthur Rossetti, MD;  Location: [facility];  Service: Orthopedics;  Laterality: Right;  Right Total Knee Arthroplasty  . TOTAL KNEE ARTHROPLASTY Left 04/09/2014   Procedure: LEFT TOTAL KNEE ARTHROPLASTY;  Surgeon: Mcarthur Rossetti, MD;  Location: [place];  Service: Orthopedics;  Laterality: Left;  . TUBAL LIGATION      FAMILY HISTORY Family History  Problem Relation Age of Onset  . Heart disease Mother   . Breast cancer Mother        73's  . Stroke Father   . Heart disease Father   . Healthy Daughter   . Healthy Son   . Liver disease Maternal Grandmother        gall stones imbedded in liver - blead to death when in surgery   . Pneumonia Maternal Grandfather   . Diabetes Paternal Grandmother     SOCIAL HISTORY Social History   Tobacco Use  . Smoking status: Never Smoker  . Smokeless tobacco: Never Used  Substance Use Topics  . Alcohol use: No  . Drug use: No         OPHTHALMIC EXAM:  Base Eye Exam    Visual Acuity (Snellen - Linear)      Right Left   Dist [place]  20/20 -1   Dist cc 20/30 -1    Dist ph cc NI        Tonometry (Tonopen, 9:04 AM)      Right Left   Pressure 18 18       Pupils      Dark Light Shape React APD   Right 3 2 Round Brisk None   Left 3 2 Round Brisk None       Visual Fields (Counting fingers)      Left Right    Full Full        Extraocular Movement      Right Left    Full, Ortho Full, Ortho       Neuro/Psych    Oriented x3:  Yes   Mood/Affect:  Normal       Dilation    Both eyes:  1.0% Mydriacyl, 2.5% Phenylephrine @ 9:04 AM  Slit Lamp and Fundus Exam    Slit Lamp Exam      Right Left   Lids/Lashes Dermatochalasis - upper lid, Meibomian gland dysfunction Dermatochalasis - upper lid, Meibomian gland dysfunction   Conjunctiva/Sclera White and quiet White and quiet   Cornea 1+ Punctate epithelial erosions, Debris in tear film - improved, +ABMD 1+ Punctate epithelial erosions, Debris in tear film   Anterior Chamber deep and clear deep and clear, narrow temporal angle   Iris Round and dilated, No NVI Round and dilated, No NVI   Lens PC IOL in good position, trace Posterior capsular opacification PC IOL in good position with open PC   Vitreous mild Vitreous syneresis, Posterior vitreous detachment, silicone oil bubble mild Vitreous syneresis       Fundus Exam      Right Left   Disc compact, mild tilt, temporal PPA compact, Pink and Sharp, temporal Peripapillary atrophy   C/D Ratio 0.1 0.2   Macula Blunted foveal reflex, persistent edema, improving IRH superior macula  Flat, Blunted foveal reflex, Retinal pigment epithelial mottling, No heme or edema   Vessels Vascular attenuation, Tortuous Vascular attenuation, Tortuous   Periphery Attached, scattered reticular degeneration, few IRH temporally Attached, reticular degeneration, rare MA          IMAGING AND PROCEDURES  Imaging and Procedures for @TODAY @  OCT, Retina - OU - Both Eyes       Right Eye Quality was good. Central Foveal Thickness: 296. Progression has improved. Findings include abnormal foveal contour, no SRF, intraretinal fluid, cystoid macular edema (Interval improvement in IRF).   Left Eye Quality was good. Central Foveal Thickness: 280. Progression has been stable. Findings include normal foveal contour, no IRF, no SRF.    Notes *Images captured and stored on drive  Diagnosis / Impression:  OD: superior BRVO w/ CME -- interval improvement in IRF OS: NFP; no IRF/SRF  Clinical management:  See below  Abbreviations: NFP - Normal foveal profile. CME - cystoid macular edema. PED - pigment epithelial detachment. IRF - intraretinal fluid. SRF - subretinal fluid. EZ - ellipsoid zone. ERM - epiretinal membrane. ORA - outer retinal atrophy. ORT - outer retinal tubulation. SRHM - subretinal hyper-reflective material        Fluorescein Angiography Optos (Transit OD)       Right Eye   Progression has no prior data. Early phase findings include vascular perfusion defect, microaneurysm, leakage. Mid/Late phase findings include leakage, vascular perfusion defect, microaneurysm (Focal area of vascular non-perfusion and telangiectatic  vessels superior macula -- consistent with BRVO).   Left Eye   Progression has no prior data. Early phase findings include microaneurysm. Mid/Late phase findings include microaneurysm, leakage.   Notes Images stored on drive;   Impression: OD: superior BRVO; mild to moderate NPDR OS: mild to moderate NPDR         Intravitreal Injection, Pharmacologic Agent - OD - Right Eye       Time Out 03/16/2019. 10:22 AM. Confirmed correct patient, procedure, site, and patient consented.   Anesthesia Topical anesthesia was used. Anesthetic medications included Lidocaine 2%, Proparacaine 0.5%.   Procedure Preparation included 5% betadine to ocular surface, eyelid speculum. A supplied needle was used.   Injection:  1.25 mg Bevacizumab (AVASTIN) SOLN   NDC: 16109-604-54, Lot: [id number] , Expiration date: 05/02/2019   Route: Intravitreal, Site: Right Eye, Waste: 0 mL  Post-op Post injection exam found visual acuity of at least counting fingers. The patient tolerated the procedure well. There were no  complications. The patient received written and verbal post procedure care  education.                 ASSESSMENT/PLAN:    ICD-10-CM   1. Branch retinal vein occlusion of right eye with macular edema H34.8310 Fluorescein Angiography Optos (Transit OD)    Intravitreal Injection, Pharmacologic Agent - OD - Right Eye    Bevacizumab (AVASTIN) SOLN 1.25 mg  2. Retinal edema H35.81 OCT, Retina - OU - Both Eyes  3. 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 Essential hypertension I10   5. Hypertensive retinopathy of both eyes H35.033 Fluorescein Angiography Optos (Transit OD)  6. Combined forms of age-related cataract of left eye H25.812   7. Pseudophakia of both eyes Z96.1     1,2. BRVO w/ CME OD  - s/p IVA OD #1 (01.31.20), #2 (02.28.20), #3 (05.08.20)  - delayed follow up to 10 weeks instead of four from 2.28 to 5.08 due to COVID-19 restrictions  - BCVA relatively stable at 20/30  - OCT shows interval improvement in IRF   - FA today (06.05.20) shows focal area of vascular non-perfusion and telangiectatic vessels superior macula -- consistent with BRVO  - recommend IVA OD #4 today, 06.05.20  - RBA of procedure discussed, questions answered  - informed consent obtained and signed  - see procedure note  - F/U 4 weeks -- DFE/OCT  3. Mild to moderate nonproliferative diabetic retinopathy OU  - The incidence, risk factors for progression, natural history and treatment options for diabetic retinopathy were discussed with patient.    - The need for close monitoring of blood glucose, blood pressure, and serum lipids, avoiding cigarette or any type of tobacco, and the need for long term follow up was also discussed with patient.  - exam with rare, scattered IRH/MA OU  - FA (06.05.20) shows leaking microaneurysms OU; no NV OU  - OCT shows interval improvement in IRF OD (BRVO w/ CME as above)  - monitor  4,5. Hypertensive  retinopathy OU  - discussed importance of tight BP control  - monitor   7. Pseudophakia OU  - s/p CE/IOL OU  - OD: done at [facility] around 2010; OS: Dr. Nancy Fetter (May 2020)  - beautiful surgeries, doing well  - post op management OS per Dr. Nancy Fetter    Ophthalmic Meds Ordered this visit:  Meds ordered this encounter  Medications  . Bevacizumab (AVASTIN) SOLN 1.25 mg       Return in about 4 weeks (around 04/13/2019) for f/u BRVO OD, DFE, OCT.  There are no Patient Instructions on file for this visit.   Explained the diagnoses, plan, and follow up with the patient and they expressed understanding.  Patient expressed understanding of the importance of proper follow up care.   This document serves as a record of services personally performed by Gardiner Sleeper, MD, PhD. It was created on their behalf by Ernest Mallick, OA, an ophthalmic assistant. The creation of this record is the provider's dictation and/or activities during the visit.    Electronically signed by: Ernest Mallick, OA  06.03.2020 12:48 PM    Gardiner Sleeper, M.D., Ph.D. Diseases & Surgery of the Retina and Vitreous [facility]  I have reviewed the above documentation for accuracy and completeness, and I agree with the above. Gardiner Sleeper, M.D., Ph.D. 03/16/19 12:48 PM  Abbreviations: M myopia (nearsighted); A astigmatism; H hyperopia (farsighted); P presbyopia; Mrx spectacle prescription;  CTL contact lenses; OD right eye; OS left eye; OU both eyes  XT exotropia; ET esotropia; PEK punctate epithelial keratitis; PEE punctate epithelial erosions; DES dry eye syndrome; MGD meibomian gland dysfunction; ATs artificial tears; PFAT's preservative free artificial tears; [place] nuclear sclerotic cataract; PSC posterior subcapsular cataract; ERM epi-retinal membrane; PVD posterior vitreous detachment; RD retinal detachment; DM diabetes mellitus; DR diabetic retinopathy; NPDR non-proliferative diabetic  retinopathy; PDR proliferative diabetic retinopathy; CSME clinically significant macular edema; DME diabetic macular edema; dbh dot blot hemorrhages; CWS cotton wool spot; POAG primary open angle glaucoma; C/D cup-to-disc ratio; HVF humphrey visual field; GVF goldmann visual field; OCT optical coherence tomography; IOP intraocular pressure; BRVO Branch retinal vein occlusion; CRVO central retinal vein occlusion; CRAO central retinal artery occlusion; BRAO branch retinal artery occlusion; RT retinal tear; SB scleral buckle; PPV pars plana vitrectomy; VH Vitreous hemorrhage; PRP panretinal laser photocoagulation; IVK intravitreal kenalog; VMT vitreomacular traction; MH Macular hole;  NVD neovascularization of the disc; NVE neovascularization elsewhere; AREDS age related eye disease study; ARMD age related macular degeneration; POAG primary open angle glaucoma; EBMD epithelial/anterior basement membrane dystrophy; ACIOL anterior chamber intraocular lens; IOL intraocular lens; PCIOL posterior chamber intraocular lens; Phaco/IOL phacoemulsification with intraocular lens placement; [place] photorefractive keratectomy; LASIK laser assisted in situ keratomileusis; HTN hypertension; DM diabetes mellitus; COPD chronic obstructive pulmonary disease

## 2019-03-16 ENCOUNTER — Other Ambulatory Visit: Payer: Self-pay

## 2019-03-16 ENCOUNTER — Encounter (INDEPENDENT_AMBULATORY_CARE_PROVIDER_SITE_OTHER): Payer: Self-pay | Admitting: Ophthalmology

## 2019-03-16 ENCOUNTER — Ambulatory Visit (INDEPENDENT_AMBULATORY_CARE_PROVIDER_SITE_OTHER): Payer: Medicare Other | Admitting: Ophthalmology

## 2019-03-16 DIAGNOSIS — H34831 Tributary (branch) retinal vein occlusion, right eye, with macular edema: Secondary | ICD-10-CM | POA: Diagnosis not present

## 2019-03-16 DIAGNOSIS — I10 Essential (primary) hypertension: Secondary | ICD-10-CM

## 2019-03-16 DIAGNOSIS — H35033 Hypertensive retinopathy, bilateral: Secondary | ICD-10-CM | POA: Diagnosis not present

## 2019-03-16 DIAGNOSIS — E113393 Type 2 diabetes mellitus with moderate nonproliferative diabetic retinopathy without macular edema, bilateral: Secondary | ICD-10-CM | POA: Diagnosis not present

## 2019-03-16 DIAGNOSIS — H3581 Retinal edema: Secondary | ICD-10-CM | POA: Diagnosis not present

## 2019-03-16 DIAGNOSIS — Z961 Presence of intraocular lens: Secondary | ICD-10-CM

## 2019-03-16 DIAGNOSIS — H25812 Combined forms of age-related cataract, left eye: Secondary | ICD-10-CM

## 2019-03-16 MED ORDER — BEVACIZUMAB CHEMO INJECTION 1.25MG/0.05ML SYRINGE FOR KALEIDOSCOPE
1.2500 mg | INTRAVITREAL | Status: AC | PRN
  Administered 2019-03-16: 1.25 mg via INTRAVITREAL

## 2019-04-12 NOTE — Progress Notes (Addendum)
[facility] Note  04/13/2019     CHIEF COMPLAINT Patient presents for Retina Follow Up   HISTORY OF PRESENT ILLNESS: Meredith Shaw is a 73 y.o. female who presents to the clinic today for:   HPI    Retina Follow Up    Patient presents with  CRVO/BRVO.  In right eye.  This started 4 weeks ago.  Severity is moderate.  I, the attending physician,  performed the HPI with the patient and updated documentation appropriately.          Comments    Patient her for 4 weeks follow up for BRVO OD. Patient states vision doing good. No eye pain.       Last edited by Bernarda Caffey, MD on 04/13/2019 10:19 AM. (History)    pt states she had cataract sx in the left eye with Dr. Nancy Fetter 3 weeks ago, she states she is pleased with the outcome, she is on one drop one time a day  Referring physician: Baruch Gouty, [place],  [place] [id number]  HISTORICAL INFORMATION:   Selected notes from the MEDICAL RECORD NUMBER Referred by Dr. Maryjane Hurter for DM exam LEE: 01.16.20 (M. Le) [BCVA: OD: 20/40 OS: 20/50-3] Ocular Hx-PDR OU, macular edema OU, cataracts OU PMH-DM (takes metformin), arthritis, HTN, hypothyroidism    CURRENT MEDICATIONS: No current outpatient medications on file. (Ophthalmic Drugs)   No current facility-administered medications for this visit.  (Ophthalmic Drugs)   Current Outpatient Medications (Other)  Medication Sig  . amLODipine (NORVASC) 5 MG tablet Take 1 tablet (5 mg total) by mouth daily.  . Blood Glucose Monitoring Suppl (ONETOUCH VERIO) w/Device KIT 1 each by Does not apply route daily.  . cholecalciferol (VITAMIN D) 1000 units tablet Take 5,000 Units by mouth daily.   Marland Kitchen denosumab (PROLIA) 60 MG/ML SOSY injection Inject 60 mg into the skin every 6 (six) months.  Marland Kitchen glucose blood (ONETOUCH VERIO) test strip Use as instructed  . Lancets (ONETOUCH ULTRASOFT) lancets Use as instructed  . levothyroxine (SYNTHROID, LEVOTHROID) 137 MCG  tablet TAKE ONE (1) TABLET EACH DAY  . lisinopril (PRINIVIL,ZESTRIL) 10 MG tablet Take 2 tablets (20 mg total) by mouth daily for 30 days.  . metFORMIN (GLUCOPHAGE-XR) 500 MG 24 hr tablet Take 1 tablet (500 mg total) by mouth 2 (two) times daily.  . metoprolol tartrate (LOPRESSOR) 100 MG tablet Take 1 tablet (100 mg total) by mouth 2 (two) times daily.  . naproxen sodium (ALEVE) 220 MG tablet Take 220 mg by mouth 2 (two) times daily as needed (for pain).  . pravastatin (PRAVACHOL) 40 MG tablet Take 1 tablet (40 mg total) by mouth at bedtime.   Current Facility-Administered Medications (Other)  Medication Route  . Bevacizumab (AVASTIN) SOLN 1.25 mg Intravitreal  . Bevacizumab (AVASTIN) SOLN 1.25 mg Intravitreal  . denosumab (PROLIA) injection 60 mg Subcutaneous      REVIEW OF SYSTEMS: ROS    Positive for: Genitourinary, Musculoskeletal, Endocrine, Eyes   Negative for: Constitutional, Gastrointestinal, Neurological, Skin, HENT, Cardiovascular, Respiratory, Psychiatric, Allergic/Imm, Heme/Lymph   Last edited by Theodore Demark on 04/13/2019  8:57 AM. (History)       ALLERGIES Allergies  Allergen Reactions  . Penicillins Itching and Rash    PAST MEDICAL HISTORY Past Medical History:  Diagnosis Date  . Arthritis    OA AND PAIN BOTH KNEES -RIGHT KNEE HURTS WORSE THAN LEFT  . Cataract   . Diabetes mellitus  ORAL MEDICATION - NO INSULIN  . Hypertension   . Hypertensive retinopathy    OU  . Hypothyroidism   . Kidney stones    NONE AT PRESENT TIME THAT PT IS AWARE OF  . Osteoporosis    Past Surgical History:  Procedure Laterality Date  . ABDOMINAL HYSTERECTOMY    . CATARACT EXTRACTION    . CHOLECYSTECTOMY  1972  . COLONOSCOPY    . EYE SURGERY     RIGHT CATARACT EXTRACTON WITH LENS IMPLANT  . GROWTH REMOVED FROM THYROID    . INTRAMEDULLARY (IM) NAIL INTERTROCHANTERIC Left 02/10/2018   Procedure: INTRAMEDULLARY (IM) NAIL INTERTROCHANTRIC;  Surgeon: Mcarthur Rossetti, MD;  Location: [facility];  Service: Orthopedics;  Laterality: Left;  . INTRAMEDULLARY (IM) NAIL INTERTROCHANTERIC Right 08/20/2018   Procedure: ORIF RIGHT HIP INTERTROC FRACTURE;  Surgeon: Mcarthur Rossetti, MD;  Location: [facility];  Service: Orthopedics;  Laterality: Right;  . left hip surgery   02/2018  . ORIF HIP FRACTURE Right 08/20/2018  . PERCUTANEOUS NEPHROLITHOTOMY    . TOTAL KNEE ARTHROPLASTY  06/09/2012   Procedure: TOTAL KNEE ARTHROPLASTY;  Surgeon: Mcarthur Rossetti, MD;  Location: [facility];  Service: Orthopedics;  Laterality: Right;  Right Total Knee Arthroplasty  . TOTAL KNEE ARTHROPLASTY Left 04/09/2014   Procedure: LEFT TOTAL KNEE ARTHROPLASTY;  Surgeon: Mcarthur Rossetti, MD;  Location: [facility];  Service: Orthopedics;  Laterality: Left;  . TUBAL LIGATION      FAMILY HISTORY Family History  Problem Relation Age of Onset  . Heart disease Mother   . Breast cancer Mother        22's  . Stroke Father   . Heart disease Father   . Healthy Daughter   . Healthy Son   . Liver disease Maternal Grandmother        gall stones imbedded in liver - blead to death when in surgery   . Pneumonia Maternal Grandfather   . Diabetes Paternal Grandmother     SOCIAL HISTORY Social History   Tobacco Use  . Smoking status: Never Smoker  . Smokeless tobacco: Never Used  Substance Use Topics  . Alcohol use: No  . Drug use: No         OPHTHALMIC EXAM:  Base Eye Exam    Visual Acuity (Snellen - Linear)      Right Left   Dist [place] 20/30 20/20   Dist ph [place] NI        Tonometry (Tonopen, 8:55 AM)      Right Left   Pressure 19 12       Pupils      Dark Light Shape React APD   Right 3 2 Round Brisk None   Left 3 2 Round Brisk None       Visual Fields (Counting fingers)      Left Right    Full Full       Extraocular Movement      Right Left    Full, Ortho Full, Ortho       Neuro/Psych    Oriented x3: Yes   Mood/Affect: Normal       Dilation    Both eyes:  1.0% Mydriacyl, 2.5% Phenylephrine @ 8:55 AM        Slit Lamp and Fundus Exam    Slit Lamp Exam      Right Left   Lids/Lashes Dermatochalasis - upper lid, Meibomian gland dysfunction Dermatochalasis - upper lid, Meibomian gland dysfunction   Conjunctiva/Sclera  White and quiet White and quiet   Cornea 1+ Punctate epithelial erosions, Debris in tear film - improved, +ABMD 1+ Punctate epithelial erosions, Debris in tear film   Anterior Chamber deep and clear deep and clear, narrow temporal angle   Iris Round and dilated, No NVI Round and dilated, No NVI   Lens PC IOL in good position, trace Posterior capsular opacification PC IOL in good position with open PC   Vitreous mild Vitreous syneresis, Posterior vitreous detachment, silicone oil bubble mild Vitreous syneresis       Fundus Exam      Right Left   Disc compact, mild tilt, temporal PPA compact, Pink and Sharp, temporal Peripapillary atrophy   C/D Ratio 0.1 0.2   Macula Blunted foveal reflex, persistent edema, improving IRH superior macula, faint cotton wool spots  Flat, good foveal reflex, Retinal pigment epithelial mottling, No heme or edema   Vessels Vascular attenuation, Tortuous Vascular attenuation, Tortuous   Periphery Attached, scattered reticular degeneration, few IRH temporally-improved Attached, reticular degeneration, rare MA          IMAGING AND PROCEDURES  Imaging and Procedures for @TODAY @  OCT, Retina - OU - Both Eyes       Right Eye Quality was good. Central Foveal Thickness: 290. Progression has improved. Findings include abnormal foveal contour, no SRF, intraretinal fluid, cystoid macular edema (Mild interval improvement in IRF).   Left Eye Quality was good. Central Foveal Thickness: 284. Progression has been stable. Findings include normal foveal contour, no IRF, no SRF, vitreomacular adhesion .   Notes *Images captured and stored on drive  Diagnosis / Impression:  OD: superior BRVO w/ CME -- mild  interval improvement in IRF OS: NFP; no IRF/SRF--stable  Clinical management:  See below  Abbreviations: NFP - Normal foveal profile. CME - cystoid macular edema. PED - pigment epithelial detachment. IRF - intraretinal fluid. SRF - subretinal fluid. EZ - ellipsoid zone. ERM - epiretinal membrane. ORA - outer retinal atrophy. ORT - outer retinal tubulation. SRHM - subretinal hyper-reflective material        Intravitreal Injection, Pharmacologic Agent - OD - Right Eye       Time Out 04/13/2019. 10:32 AM. Confirmed correct patient, procedure, site, and patient consented.   Anesthesia Topical anesthesia was used. Anesthetic medications included Lidocaine 2%, Proparacaine 0.5%.   Procedure Preparation included 5% betadine to ocular surface, eyelid speculum. A supplied needle was used.   Injection:  1.25 mg Bevacizumab (AVASTIN) SOLN   NDC: 58850-277-41, Lot: [id number] , Expiration date: 06/20/2019   Route: Intravitreal, Site: Right Eye, Waste: 0 mL  Post-op Post injection exam found visual acuity of at least counting fingers. The patient tolerated the procedure well. There were no complications. The patient received written and verbal post procedure care education.                 ASSESSMENT/PLAN:    ICD-10-CM   1. Branch retinal vein occlusion of right eye with macular edema  H34.8310 Intravitreal Injection, Pharmacologic Agent - OD - Right Eye    Bevacizumab (AVASTIN) SOLN 1.25 mg  2. Retinal edema  H35.81 OCT, Retina - OU - Both Eyes  3. 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 Essential hypertension  I10   5. Hypertensive retinopathy of both eyes  H35.033   6. Combined forms of age-related cataract of left eye  H25.812   7. Pseudophakia of both  eyes  Z96.1   8. Diabetes mellitus type 2 without retinopathy ([place])  E11.9   9. Pseudophakia  Z96.1     1,2. BRVO w/ CME  OD - s/p IVA OD #1 (01.31.20), #2 (02.28.20), #3 (05.08.20), #4 (06.05.20) - delayed follow up to 10 weeks instead of four from 2.28 to 5.08 due to COVID-19 restrictions - BCVA relatively stable at 20/30 - OCT shows mild interval improvement in IRF  - FA (06.05.20) shows focal area of vascular non-perfusion and telangiectatic vessels superior macula -- consistent with BRVO - recommend IVA OD #5today, 07.03.20 - RBA of procedure discussed, questions answered - informed consent obtained and signed - see procedure note - F/U 4 weeks -- DFE/OCT/possible injectoin  3. Mild to moderate nonproliferative diabetic retinopathy OU  - The incidence, risk factors for progression, natural history and treatment options for diabetic retinopathy were discussed with patient.    - The need for close monitoring of blood glucose, blood pressure, and serum lipids, avoiding cigarette or any type of tobacco, and the need for long term follow up was also discussed with patient.  - exam with rare, scattered IRH/MA OU  - FA (06.05.20) shows leaking microaneurysms OU; no NV OU  - OCT shows interval improvement in IRF OD (BRVO w/ CME as above)  - monitor  4,5. Hypertensive retinopathy OU  - discussed importance of tight BP control  - monitor  6. Pseudophakia OU  - s/p CE/IOL OU  - OD: [facility] around 2010; OS: Dr. Nancy Fetter (May 2020)  - beautiful surgeries, doing well  - completed post op regimen OS  - monitor    Ophthalmic Meds Ordered this visit:  Meds ordered this encounter  Medications  . Bevacizumab (AVASTIN) SOLN 1.25 mg       Return 4-5 weeks, for DFE, OCT.  There are no Patient Instructions on file for this visit.   Explained the diagnoses, plan, and follow up with the patient and they expressed understanding.  Patient expressed understanding of the importance of  proper follow up care.   This document serves as a record of services personally performed by Gardiner Sleeper, MD, PhD. It was created on their behalf by Ernest Mallick, OA, an ophthalmic assistant. The creation of this record is the provider's dictation and/or activities during the visit.    Electronically signed by: Ernest Mallick, OA  07.02.2020 12:01 PM    Gardiner Sleeper, M.D., Ph.D. Diseases & Surgery of the Retina and Vitreous [facility]  I have reviewed the above documentation for accuracy and completeness, and I agree with the above. Gardiner Sleeper, M.D., Ph.D. 04/13/19 12:01 PM    Abbreviations: M myopia (nearsighted); A astigmatism; H hyperopia (farsighted); P presbyopia; Mrx spectacle prescription;  CTL contact lenses; OD right eye; OS left eye; OU both eyes  XT exotropia; ET esotropia; PEK punctate epithelial keratitis; PEE punctate epithelial erosions; DES dry eye syndrome; MGD meibomian gland dysfunction; ATs artificial tears; PFAT's preservative free artificial tears; [place] nuclear sclerotic cataract; PSC posterior subcapsular cataract; ERM epi-retinal membrane; PVD posterior vitreous detachment; RD retinal detachment; DM diabetes mellitus; DR diabetic retinopathy; NPDR non-proliferative diabetic retinopathy; PDR proliferative diabetic retinopathy; CSME clinically significant macular edema; DME diabetic macular edema; dbh dot blot hemorrhages; CWS cotton wool spot; POAG primary open angle glaucoma; C/D cup-to-disc ratio; HVF humphrey visual field; GVF goldmann visual field; OCT optical coherence tomography; IOP intraocular pressure; BRVO Branch retinal vein occlusion; CRVO central retinal vein  occlusion; CRAO central retinal artery occlusion; BRAO branch retinal artery occlusion; RT retinal tear; SB scleral buckle; PPV pars plana vitrectomy; VH Vitreous hemorrhage; PRP panretinal laser photocoagulation; IVK intravitreal kenalog; VMT vitreomacular traction; MH  Macular hole;  NVD neovascularization of the disc; NVE neovascularization elsewhere; AREDS age related eye disease study; ARMD age related macular degeneration; POAG primary open angle glaucoma; EBMD epithelial/anterior basement membrane dystrophy; ACIOL anterior chamber intraocular lens; IOL intraocular lens; PCIOL posterior chamber intraocular lens; Phaco/IOL phacoemulsification with intraocular lens placement; [place] photorefractive keratectomy; LASIK laser assisted in situ keratomileusis; HTN hypertension; DM diabetes mellitus; COPD chronic obstructive pulmonary disease

## 2019-04-13 ENCOUNTER — Encounter (INDEPENDENT_AMBULATORY_CARE_PROVIDER_SITE_OTHER): Payer: Self-pay | Admitting: Ophthalmology

## 2019-04-13 ENCOUNTER — Ambulatory Visit (INDEPENDENT_AMBULATORY_CARE_PROVIDER_SITE_OTHER): Payer: Medicare Other | Admitting: Ophthalmology

## 2019-04-13 ENCOUNTER — Other Ambulatory Visit: Payer: Self-pay

## 2019-04-13 DIAGNOSIS — I10 Essential (primary) hypertension: Secondary | ICD-10-CM

## 2019-04-13 DIAGNOSIS — H35033 Hypertensive retinopathy, bilateral: Secondary | ICD-10-CM

## 2019-04-13 DIAGNOSIS — H3581 Retinal edema: Secondary | ICD-10-CM

## 2019-04-13 DIAGNOSIS — E113393 Type 2 diabetes mellitus with moderate nonproliferative diabetic retinopathy without macular edema, bilateral: Secondary | ICD-10-CM

## 2019-04-13 DIAGNOSIS — H34831 Tributary (branch) retinal vein occlusion, right eye, with macular edema: Secondary | ICD-10-CM | POA: Diagnosis not present

## 2019-04-13 DIAGNOSIS — Z961 Presence of intraocular lens: Secondary | ICD-10-CM

## 2019-04-13 MED ORDER — BEVACIZUMAB CHEMO INJECTION 1.25MG/0.05ML SYRINGE FOR KALEIDOSCOPE
1.2500 mg | INTRAVITREAL | Status: AC | PRN
  Administered 2019-04-13: 12:00:00 1.25 mg via INTRAVITREAL

## 2019-04-13 NOTE — Progress Notes (Deleted)
1,2. BRVO w/ CME OD             - s/p IVA OD #1 (01.31.20), #2 (02.28.20), #3 (05.08.20), #4 (06.05.20)             - delayed follow up to 10 weeks instead of four from 2.28 to 5.08 due to COVID-19 restrictions             - BCVA relatively stable at 20/30             - OCT shows mild interval improvement in IRF              - FA (06.05.20) shows focal area of vascular non-perfusion and telangiectatic vessels superior macula -- consistent with BRVO             - recommend IVA OD #5 today, 07.03.20             - RBA of procedure discussed, questions answered             - informed consent obtained and signed             - see procedure note             - F/U 4 weeks -- DFE/OCT

## 2019-05-10 NOTE — Progress Notes (Addendum)
[facility] Note  05/11/2019     CHIEF COMPLAINT Patient presents for Retina Follow Up   HISTORY OF PRESENT ILLNESS: Meredith Shaw is a 73 y.o. female who presents to the clinic today for:   HPI    Retina Follow Up    Patient presents with  CRVO/BRVO.  In right eye.  Severity is moderate.  Duration of 4 weeks.  Since onset it is stable.  I, the attending physician,  performed the HPI with the patient and updated documentation appropriately.          Comments    Patient states her vision is doing very well and only has some slight discomfort in nasal corner of her right eye.  Patient states nasal corner fills with matter and becomes uncomfortable.  Patient denies eye pain.  Patient denies any new or worsening floaters or fol.       Last edited by Bernarda Caffey, MD on 05/11/2019 11:22 AM. (History)      Referring physician: Baruch Gouty, [place],  [place] [address]  HISTORICAL INFORMATION:   Selected notes from the MEDICAL RECORD NUMBER Referred by Dr. Maryjane Hurter for DM exam LEE: 01.16.20 (M. Le) [BCVA: OD: 20/40 OS: 20/50-3] Ocular Hx-PDR OU, macular edema OU, cataracts OU PMH-DM (takes metformin), arthritis, HTN, hypothyroidism    CURRENT MEDICATIONS: No current outpatient medications on file. (Ophthalmic Drugs)   No current facility-administered medications for this visit.  (Ophthalmic Drugs)   Current Outpatient Medications (Other)  Medication Sig  . amLODipine (NORVASC) 5 MG tablet Take 1 tablet (5 mg total) by mouth daily.  . Blood Glucose Monitoring Suppl (ONETOUCH VERIO) w/Device KIT 1 each by Does not apply route daily.  . cholecalciferol (VITAMIN D) 1000 units tablet Take 5,000 Units by mouth daily.   Marland Kitchen denosumab (PROLIA) 60 MG/ML SOSY injection Inject 60 mg into the skin every 6 (six) months.  Marland Kitchen glucose blood (ONETOUCH VERIO) test strip Use as instructed  . Lancets (ONETOUCH ULTRASOFT) lancets Use as instructed   . levothyroxine (SYNTHROID, LEVOTHROID) 137 MCG tablet TAKE ONE (1) TABLET EACH DAY  . metFORMIN (GLUCOPHAGE-XR) 500 MG 24 hr tablet Take 1 tablet (500 mg total) by mouth 2 (two) times daily.  . metoprolol tartrate (LOPRESSOR) 100 MG tablet Take 1 tablet (100 mg total) by mouth 2 (two) times daily.  . naproxen sodium (ALEVE) 220 MG tablet Take 220 mg by mouth 2 (two) times daily as needed (for pain).  . pravastatin (PRAVACHOL) 40 MG tablet Take 1 tablet (40 mg total) by mouth at bedtime.  Marland Kitchen lisinopril (PRINIVIL,ZESTRIL) 10 MG tablet Take 2 tablets (20 mg total) by mouth daily for 30 days.   Current Facility-Administered Medications (Other)  Medication Route  . Bevacizumab (AVASTIN) SOLN 1.25 mg Intravitreal  . Bevacizumab (AVASTIN) SOLN 1.25 mg Intravitreal  . denosumab (PROLIA) injection 60 mg Subcutaneous      REVIEW OF SYSTEMS: ROS    Positive for: Genitourinary, Musculoskeletal, Endocrine, Eyes   Negative for: Constitutional, Gastrointestinal, Neurological, Skin, HENT, Cardiovascular, Respiratory, Psychiatric, Allergic/Imm, Heme/Lymph   Last edited by Roselee Nova D on 05/11/2019  9:41 AM. (History)       ALLERGIES Allergies  Allergen Reactions  . Penicillins Itching and Rash    PAST MEDICAL HISTORY Past Medical History:  Diagnosis Date  . Arthritis    OA AND PAIN BOTH KNEES -RIGHT KNEE HURTS WORSE THAN LEFT  . Cataract   .  Diabetes mellitus    ORAL MEDICATION - NO INSULIN  . Hypertension   . Hypertensive retinopathy    OU  . Hypothyroidism   . Kidney stones    NONE AT PRESENT TIME THAT PT IS AWARE OF  . Osteoporosis    Past Surgical History:  Procedure Laterality Date  . ABDOMINAL HYSTERECTOMY    . CATARACT EXTRACTION    . CHOLECYSTECTOMY  1972  . COLONOSCOPY    . EYE SURGERY     RIGHT CATARACT EXTRACTON WITH LENS IMPLANT  . GROWTH REMOVED FROM THYROID    . INTRAMEDULLARY (IM) NAIL INTERTROCHANTERIC Left 02/10/2018   Procedure: INTRAMEDULLARY (IM) NAIL  INTERTROCHANTRIC;  Surgeon: Mcarthur Rossetti, MD;  Location: [facility];  Service: Orthopedics;  Laterality: Left;  . INTRAMEDULLARY (IM) NAIL INTERTROCHANTERIC Right 08/20/2018   Procedure: ORIF RIGHT HIP INTERTROC FRACTURE;  Surgeon: Mcarthur Rossetti, MD;  Location: [facility];  Service: Orthopedics;  Laterality: Right;  . left hip surgery   02/2018  . ORIF HIP FRACTURE Right 08/20/2018  . PERCUTANEOUS NEPHROLITHOTOMY    . TOTAL KNEE ARTHROPLASTY  06/09/2012   Procedure: TOTAL KNEE ARTHROPLASTY;  Surgeon: Mcarthur Rossetti, MD;  Location: [facility];  Service: Orthopedics;  Laterality: Right;  Right Total Knee Arthroplasty  . TOTAL KNEE ARTHROPLASTY Left 04/09/2014   Procedure: LEFT TOTAL KNEE ARTHROPLASTY;  Surgeon: Mcarthur Rossetti, MD;  Location: [facility];  Service: Orthopedics;  Laterality: Left;  . TUBAL LIGATION      FAMILY HISTORY Family History  Problem Relation Age of Onset  . Heart disease Mother   . Breast cancer Mother        15's  . Stroke Father   . Heart disease Father   . Healthy Daughter   . Healthy Son   . Liver disease Maternal Grandmother        gall stones imbedded in liver - blead to death when in surgery   . Pneumonia Maternal Grandfather   . Diabetes Paternal Grandmother     SOCIAL HISTORY Social History   Tobacco Use  . Smoking status: Never Smoker  . Smokeless tobacco: Never Used  Substance Use Topics  . Alcohol use: No  . Drug use: No         OPHTHALMIC EXAM:  Base Eye Exam    Visual Acuity (Snellen - Linear)      Right Left   Dist [place] 20/25 -1 20/20 -2   Correction: Glasses       Tonometry (Tonopen, 10:07 AM)      Right Left   Pressure 24 22  Squeezing       Pupils      Dark Light Shape React APD   Right 4 3 Round Brisk 0   Left 4 3 Round Brisk 0       Visual Fields      Left Right    Full Full       Extraocular Movement      Right Left    Full Full       Neuro/Psych    Oriented x3: Yes   Mood/Affect:  Normal       Dilation    Both eyes: 1.0% Mydriacyl, 2.5% Phenylephrine @ 10:07 AM        Slit Lamp and Fundus Exam    Slit Lamp Exam      Right Left   Lids/Lashes Dermatochalasis - upper lid, Meibomian gland dysfunction Dermatochalasis - upper lid, Meibomian gland dysfunction  Conjunctiva/Sclera White and quiet White and quiet   Cornea 1+ Punctate epithelial erosions, Debris in tear film - improved, +ABMD 1+ Punctate epithelial erosions, Debris in tear film   Anterior Chamber deep and clear deep and clear, narrow temporal angle   Iris Round and dilated, No NVI Round and dilated, No NVI   Lens PC IOL in good position, trace Posterior capsular opacification PC IOL in good position with open PC   Vitreous mild Vitreous syneresis, Posterior vitreous detachment, silicone oil bubble mild Vitreous syneresis       Fundus Exam      Right Left   Disc compact, mild tilt, temporal PPA, mild pallor compact, Pink and Sharp, temporal Peripapillary atrophy   C/D Ratio 0.1 0.2   Macula Blunted foveal reflex, persistent edema, improving IRH superior macula, faint cotton wool spots--fading Flat, good foveal reflex, Retinal pigment epithelial mottling, No edema, focal heme temporal macula   Vessels Vascular attenuation, Tortuous Vascular attenuation, Tortuous   Periphery Attached, scattered reticular degeneration, few IRH temporally-improved Attached, reticular degeneration, rare MA          IMAGING AND PROCEDURES  Imaging and Procedures for _0 @  OCT, Retina - OU - Both Eyes       Right Eye Quality was good. Central Foveal Thickness: 270. Progression has improved. Findings include abnormal foveal contour, no SRF, intraretinal fluid, cystoid macular edema (Mild interval improvement in IRF).   Left Eye Quality was good. Central Foveal Thickness: 234. Progression has been stable. Findings include normal foveal contour, no IRF, no SRF, vitreomacular adhesion .   Notes *Images captured and  stored on drive  Diagnosis / Impression:  OD: superior BRVO w/ CME -- mild interval improvement in superior edema, mild interval increase in foveal cysts OS: NFP; no IRF/SRF--stable  Clinical management:  See below  Abbreviations: NFP - Normal foveal profile. CME - cystoid macular edema. PED - pigment epithelial detachment. IRF - intraretinal fluid. SRF - subretinal fluid. EZ - ellipsoid zone. ERM - epiretinal membrane. ORA - outer retinal atrophy. ORT - outer retinal tubulation. SRHM - subretinal hyper-reflective material        Intravitreal Injection, Pharmacologic Agent - OD - Right Eye       Time Out 05/11/2019. 11:30 AM. Confirmed correct patient, procedure, site, and patient consented.   Anesthesia Topical anesthesia was used. Anesthetic medications included Lidocaine 2%, Proparacaine 0.5%.   Procedure Preparation included 5% betadine to ocular surface, eyelid speculum. A 30 gauge needle was used.   Injection:  1.25 mg Bevacizumab (AVASTIN) SOLN   NDC: 14481-856-31, Lot: [id number] , Expiration date: 07/12/2019   Route: Intravitreal, Site: Right Eye, Waste: 0 mL  Post-op Post injection exam found visual acuity of at least counting fingers. The patient tolerated the procedure well. There were no complications. The patient received written and verbal post procedure care education.                 ASSESSMENT/PLAN:    ICD-10-CM   1. Branch retinal vein occlusion of right eye with macular edema  H34.8310 Intravitreal Injection, Pharmacologic Agent - OD - Right Eye    Bevacizumab (AVASTIN) SOLN 1.25 mg  2. Retinal edema  H35.81 OCT, Retina - OU - Both Eyes  3. 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 Essential hypertension  I10   5. Hypertensive retinopathy of both eyes  H35.033   6. Pseudophakia of both eyes  Z96.1     1,2. BRVO w/ CME OD - s/p  IVA OD #1 (01.31.20), #2 (02.28.20), #3 (05.08.20), #4 (06.05.20), #5 (07.03.20) - delayed follow up to 10 weeks instead of four from 2.28 to 5.08 due to COVID-19 restrictions - BCVA slightly improved to 20/25 - OCT shows mild interval improvement in superior edema, mild interval increase in foveal cysts - FA (06.05.20) shows focal area of vascular non-perfusion and telangiectatic vessels superior macula -- consistent with BRVO - recommend IVA OD #6today, 07.31.20 - RBA of procedure discussed, questions answered - informed consent obtained and signed - see procedure note - F/U 4 weeks -- DFE/OCT/possible injectoin  3. Mild to moderate nonproliferative diabetic retinopathy OU  - The incidence, risk factors for progression, natural history and treatment options for diabetic retinopathy were discussed with patient.    - The need for close monitoring of blood glucose, blood pressure, and serum lipids, avoiding cigarette or any type of tobacco, and the need for long term follow up was also discussed with patient.  - exam with rare, scattered IRH/MA OU  - FA (06.05.20) shows leaking microaneurysms OU; no NV OU  - OCT shows interval improvement in IRF OD (BRVO w/ CME as above)  - monitor  4,5. Hypertensive retinopathy OU  - discussed importance of tight BP control  - monitor  6. Pseudophakia OU  - s/p CE/IOL OU  - OD: done at [facility] around 2010; OS: Dr. Nancy Fetter (May 2020)  - beautiful surgeries, doing well  - completed post op regimen OS  - monitor    Ophthalmic Meds Ordered this visit:  Meds ordered this encounter  Medications  . Bevacizumab (AVASTIN) SOLN 1.25 mg       Return in about 4 weeks (around 06/08/2019) for DFE, OCT.  There are no Patient Instructions on file for this visit.   Explained the diagnoses, plan, and follow up with the patient and they expressed  understanding.  Patient expressed understanding of the importance of proper follow up care.   This document serves as a record of services personally performed by Gardiner Sleeper, MD, PhD. It was created on their behalf by Roselee Nova, COMT. The creation of this record is the provider's dictation and/or activities during the visit.  Electronically signed by: Roselee Nova, COMT 05/12/19 12:37 AM   Gardiner Sleeper, M.D., Ph.D. Diseases & Surgery of the Retina and Vitreous [facility]  I have reviewed the above documentation for accuracy and completeness, and I agree with the above. Gardiner Sleeper, M.D., Ph.D. 05/12/19 12:37 AM     Abbreviations: M myopia (nearsighted); A astigmatism; H hyperopia (farsighted); P presbyopia; Mrx spectacle prescription;  CTL contact lenses; OD right eye; OS left eye; OU both eyes  XT exotropia; ET esotropia; PEK punctate epithelial keratitis; PEE punctate epithelial erosions; DES dry eye syndrome; MGD meibomian gland dysfunction; ATs artificial tears; PFAT's preservative free artificial tears; [place] nuclear sclerotic cataract; PSC posterior subcapsular cataract; ERM epi-retinal membrane; PVD posterior vitreous detachment; RD retinal detachment; DM diabetes mellitus; DR diabetic retinopathy; NPDR non-proliferative diabetic retinopathy; PDR proliferative diabetic retinopathy; CSME clinically significant macular edema; DME diabetic macular edema; dbh dot blot hemorrhages; CWS cotton wool spot; POAG primary open angle glaucoma; C/D cup-to-disc ratio; HVF humphrey visual field; GVF goldmann visual field; OCT optical coherence tomography; IOP intraocular pressure; BRVO Branch retinal vein occlusion; CRVO central retinal vein occlusion; CRAO central retinal artery occlusion; BRAO branch retinal artery occlusion; RT retinal tear;  SB scleral buckle; PPV pars plana vitrectomy; VH Vitreous hemorrhage; PRP panretinal laser photocoagulation; IVK intravitreal  kenalog; VMT vitreomacular traction; MH Macular hole;  NVD neovascularization of the disc; NVE neovascularization elsewhere; AREDS age related eye disease study; ARMD age related macular degeneration; POAG primary open angle glaucoma; EBMD epithelial/anterior basement membrane dystrophy; ACIOL anterior chamber intraocular lens; IOL intraocular lens; PCIOL posterior chamber intraocular lens; Phaco/IOL phacoemulsification with intraocular lens placement; [place] photorefractive keratectomy; LASIK laser assisted in situ keratomileusis; HTN hypertension; DM diabetes mellitus; COPD chronic obstructive pulmonary disease

## 2019-05-11 ENCOUNTER — Encounter (INDEPENDENT_AMBULATORY_CARE_PROVIDER_SITE_OTHER): Payer: Self-pay | Admitting: Ophthalmology

## 2019-05-11 ENCOUNTER — Ambulatory Visit (INDEPENDENT_AMBULATORY_CARE_PROVIDER_SITE_OTHER): Payer: Medicare Other | Admitting: Ophthalmology

## 2019-05-11 ENCOUNTER — Other Ambulatory Visit: Payer: Self-pay

## 2019-05-11 DIAGNOSIS — H34831 Tributary (branch) retinal vein occlusion, right eye, with macular edema: Secondary | ICD-10-CM | POA: Diagnosis not present

## 2019-05-11 DIAGNOSIS — H3581 Retinal edema: Secondary | ICD-10-CM | POA: Diagnosis not present

## 2019-05-11 DIAGNOSIS — E113393 Type 2 diabetes mellitus with moderate nonproliferative diabetic retinopathy without macular edema, bilateral: Secondary | ICD-10-CM | POA: Diagnosis not present

## 2019-05-11 DIAGNOSIS — I10 Essential (primary) hypertension: Secondary | ICD-10-CM

## 2019-05-11 DIAGNOSIS — Z961 Presence of intraocular lens: Secondary | ICD-10-CM

## 2019-05-11 DIAGNOSIS — H35033 Hypertensive retinopathy, bilateral: Secondary | ICD-10-CM

## 2019-05-12 MED ORDER — BEVACIZUMAB CHEMO INJECTION 1.25MG/0.05ML SYRINGE FOR KALEIDOSCOPE
1.2500 mg | INTRAVITREAL | Status: AC | PRN
  Administered 2019-05-12: 1.25 mg via INTRAVITREAL

## 2019-05-14 ENCOUNTER — Telehealth: Payer: Self-pay | Admitting: *Deleted

## 2019-05-14 ENCOUNTER — Encounter: Payer: Self-pay | Admitting: Family Medicine

## 2019-05-14 ENCOUNTER — Ambulatory Visit (INDEPENDENT_AMBULATORY_CARE_PROVIDER_SITE_OTHER): Payer: Medicare Other | Admitting: Family Medicine

## 2019-05-14 ENCOUNTER — Other Ambulatory Visit: Payer: Self-pay

## 2019-05-14 VITALS — BP 154/83 | Temp 97.5°F | Ht 67.0 in | Wt 174.0 lb

## 2019-05-14 DIAGNOSIS — E118 Type 2 diabetes mellitus with unspecified complications: Secondary | ICD-10-CM

## 2019-05-14 DIAGNOSIS — E890 Postprocedural hypothyroidism: Secondary | ICD-10-CM | POA: Diagnosis not present

## 2019-05-14 DIAGNOSIS — E785 Hyperlipidemia, unspecified: Secondary | ICD-10-CM

## 2019-05-14 DIAGNOSIS — E113511 Type 2 diabetes mellitus with proliferative diabetic retinopathy with macular edema, right eye: Secondary | ICD-10-CM

## 2019-05-14 DIAGNOSIS — N183 Chronic kidney disease, stage 3 (moderate): Secondary | ICD-10-CM

## 2019-05-14 DIAGNOSIS — I10 Essential (primary) hypertension: Secondary | ICD-10-CM

## 2019-05-14 DIAGNOSIS — E1122 Type 2 diabetes mellitus with diabetic chronic kidney disease: Secondary | ICD-10-CM | POA: Diagnosis not present

## 2019-05-14 DIAGNOSIS — E1159 Type 2 diabetes mellitus with other circulatory complications: Secondary | ICD-10-CM | POA: Diagnosis not present

## 2019-05-14 DIAGNOSIS — E1169 Type 2 diabetes mellitus with other specified complication: Secondary | ICD-10-CM | POA: Insufficient documentation

## 2019-05-14 LAB — BAYER DCA HB A1C WAIVED: HB A1C (BAYER DCA - WAIVED): 8.8 % — ABNORMAL HIGH (ref <7.0)

## 2019-05-14 MED ORDER — PRAVASTATIN SODIUM 40 MG PO TABS: 40.0000 mg | ORAL_TABLET | Freq: Every day | ORAL | 1 refills | Status: DC

## 2019-05-14 MED ORDER — LEVOTHYROXINE SODIUM 137 MCG PO TABS: ORAL_TABLET | ORAL | 3 refills | Status: DC

## 2019-05-14 MED ORDER — METOPROLOL TARTRATE 100 MG PO TABS: 100.0000 mg | ORAL_TABLET | Freq: Two times a day (BID) | ORAL | 1 refills | Status: DC

## 2019-05-14 MED ORDER — LISINOPRIL 20 MG PO TABS: 20.0000 mg | ORAL_TABLET | Freq: Every day | ORAL | 3 refills | Status: DC

## 2019-05-14 MED ORDER — LISINOPRIL 10 MG PO TABS: 20.0000 mg | ORAL_TABLET | Freq: Every day | ORAL | 6 refills | Status: DC

## 2019-05-14 MED ORDER — AMLODIPINE BESYLATE 5 MG PO TABS: 5.0000 mg | ORAL_TABLET | Freq: Every day | ORAL | 1 refills | Status: DC

## 2019-05-14 MED ORDER — METFORMIN HCL ER (MOD) 1000 MG PO TB24: 1000.0000 mg | ORAL_TABLET | Freq: Two times a day (BID) | ORAL | 1 refills | Status: DC

## 2019-05-14 MED ORDER — METFORMIN HCL ER 500 MG PO TB24: 500.0000 mg | ORAL_TABLET | Freq: Two times a day (BID) | ORAL | 1 refills | Status: DC

## 2019-05-14 NOTE — Patient Instructions (Signed)
It was a pleasure seeing you today, Meredith Shaw.  Information regarding what we discussed is included in this packet.  Please make an appointment to see me in 3 months.   You had labs performed today.  You will be contacted with the abnormal results once they are available, usually in the next 3 business days for routine lab work.  If you had STI testing, a pap smear, or a biopsy performed, expect to be contacted in about 7-10 days. If results are normal, you will not be notified.   Continue to monitor your blood sugars as we discussed and record them. Bring the log to your next appointment.  Take your medications as directed.    Goal Blood glucose:    Fasting (before meals) = 80 to 130   Within 2 hours of eating = less than 180   Understanding your Hemoglobin A1c:     Diabetes Mellitus and Nutrition    I think that you would greatly benefit from seeing a nutritionist. If this is something you are interested in, please call Dr Gerilyn PilgrimSykes at [phone] to schedule an appointment.   When you have diabetes (diabetes mellitus), it is very important to have healthy eating habits because your blood sugar (glucose) levels are greatly affected by what you eat and drink. Eating healthy foods in the appropriate amounts, at about the same times every day, can help you:  Control your blood glucose.  Lower your risk of heart disease.  Improve your blood pressure.  Reach or maintain a healthy weight.  Every person with diabetes is different, and each person has different needs for a meal plan. Your health care provider may recommend that you work with a diet and nutrition specialist (dietitian) to make a meal plan that is best for you. Your meal plan may vary depending on factors such as:  The calories you need.  The medicines you take.  Your weight.  Your blood glucose, blood pressure, and cholesterol levels.  Your activity level.  Other health conditions you have, such as heart or kidney  disease.  How do carbohydrates affect me? Carbohydrates affect your blood glucose level more than any other type of food. Eating carbohydrates naturally increases the amount of glucose in your blood. Carbohydrate counting is a method for keeping track of how many carbohydrates you eat. Counting carbohydrates is important to keep your blood glucose at a healthy level, especially if you use insulin or take certain oral diabetes medicines. It is important to know how many carbohydrates you can safely have in each meal. This is different for every person. Your dietitian can help you calculate how many carbohydrates you should have at each meal and for snack. Foods that contain carbohydrates include:  Bread, cereal, rice, pasta, and crackers.  Potatoes and corn.  Peas, beans, and lentils.  Milk and yogurt.  Fruit and juice.  Desserts, such as cakes, cookies, ice cream, and candy.  How does alcohol affect me? Alcohol can cause a sudden decrease in blood glucose (hypoglycemia), especially if you use insulin or take certain oral diabetes medicines. Hypoglycemia can be a life-threatening condition. Symptoms of hypoglycemia (sleepiness, dizziness, and confusion) are similar to symptoms of having too much alcohol. If your health care provider says that alcohol is safe for you, follow these guidelines:  Limit alcohol intake to no more than 1 drink per day for nonpregnant women and 2 drinks per day for men. One drink equals 12 oz of beer, 5 oz of wine, or  1 oz of hard liquor.  Do not drink on an empty stomach.  Keep yourself hydrated with water, diet soda, or unsweetened iced tea.  Keep in mind that regular soda, juice, and other mixers may contain a lot of sugar and must be counted as carbohydrates.  What are tips for following this plan?  Reading food labels  Start by checking the serving size on the label. The amount of calories, carbohydrates, fats, and other nutrients listed on the label  are based on one serving of the food. Many foods contain more than one serving per package.  Check the total grams (g) of carbohydrates in one serving. You can calculate the number of servings of carbohydrates in one serving by dividing the total carbohydrates by 15. For example, if a food has 30 g of total carbohydrates, it would be equal to 2 servings of carbohydrates.  Check the number of grams (g) of saturated and trans fats in one serving. Choose foods that have low or no amount of these fats.  Check the number of milligrams (mg) of sodium in one serving. Most people should limit total sodium intake to less than 2,300 mg per day.  Always check the nutrition information of foods labeled as "low-fat" or "nonfat". These foods may be higher in added sugar or refined carbohydrates and should be avoided.  Talk to your dietitian to identify your daily goals for nutrients listed on the label.  Shopping  Avoid buying canned, premade, or processed foods. These foods tend to be high in fat, sodium, and added sugar.  Shop around the outside edge of the grocery store. This includes fresh fruits and vegetables, bulk grains, fresh meats, and fresh dairy.  Cooking  Use low-heat cooking methods, such as baking, instead of high-heat cooking methods like deep frying.  Cook using healthy oils, such as olive, canola, or sunflower oil.  Avoid cooking with butter, cream, or high-fat meats.  Meal planning  Eat meals and snacks regularly, preferably at the same times every day. Avoid going long periods of time without eating.  Eat foods high in fiber, such as fresh fruits, vegetables, beans, and whole grains. Talk to your dietitian about how many servings of carbohydrates you can eat at each meal.  Eat 4-6 ounces of lean protein each day, such as lean meat, chicken, fish, eggs, or tofu. 1 ounce is equal to 1 ounce of meat, chicken, or fish, 1 egg, or 1/4 cup of tofu.  Eat some foods each day that  contain healthy fats, such as avocado, nuts, seeds, and fish.  Lifestyle   Check your blood glucose regularly.  Exercise at least 30 minutes 5 or more days each week, or as told by your health care provider.  Take medicines as told by your health care provider.  Do not use any products that contain nicotine or tobacco, such as cigarettes and e-cigarettes. If you need help quitting, ask your health care provider.  Work with a Veterinary surgeoncounselor or diabetes educator to identify strategies to manage stress and any emotional and social challenges.   What are some questions to ask my health care provider?  Do I need to meet with a diabetes educator?  Do I need to meet with a dietitian?  What number can I call if I have questions?  When are the best times to check my blood glucose?   Where to find more information:  American Diabetes Association: diabetes.org/food-and-fitness/food  Academy of Nutrition and Dietetics: https://www.vargas.com/www.eatright.org/resources/health/diseases-and-conditions/diabetes  [employer] Institute  of Diabetes and Digestive and Kidney Diseases (NIH): ContactWire.be   Summary  A healthy meal plan will help you control your blood glucose and maintain a healthy lifestyle.  Working with a diet and nutrition specialist (dietitian) can help you make a meal plan that is best for you.  Keep in mind that carbohydrates and alcohol have immediate effects on your blood glucose levels. It is important to count carbohydrates and to use alcohol carefully. This information is not intended to replace advice given to you by your health care provider. Make sure you discuss any questions you have with your health care provider. Document Released: 06/24/2005 Document Revised: 11/01/2016 Document Reviewed: 11/01/2016 Elsevier Interactive Patient Education  [employer].     In a few days you may receive a survey in the mail  or online from [employer] regarding your visit with [place] today. Please take a moment to fill this out. Your feedback is very important to our office. It can help [place] better understand your needs as well as improve your experience and satisfaction. Thank you for taking your time to complete it. We care about you.  Because of recent events of COVID-19 ("Coronavirus"), please follow CDC recommendations:   1. Wash your hand frequently 2. Avoid touching your face 3. Stay away from people who are sick 4. If you have symptoms such as fever, cough, shortness of breath then call your healthcare provider for further guidance 5. If you are sick, STAY AT HOME, unless otherwise directed by your healthcare provider. 6. Follow directions from state and national officials regarding staying safe    Please feel free to call our office if any questions or concerns arise.  Warm Regards, Monia Pouch, FNP-C [facility] [address] [place], [place] [address] [phone]

## 2019-05-14 NOTE — Progress Notes (Addendum)
Subjective:  Patient ID: Meredith Shaw, female    DOB: 1946/09/24, 73 y.o.   MRN: [id number]  Patient Care Team: Baruch Gouty, FNP as PCP - General (Family Medicine) Mcarthur Rossetti, MD as Consulting Physician (Orthopedic Surgery) Rana Snare, MD as Consulting Physician (Urology) Corey Harold, MD as Consulting Physician   Chief Complaint:  Medical Management of Chronic Issues, Hypertension, Diabetes, and Hypothyroidism   HPI: Meredith Shaw is a 73 y.o. female presenting on 05/14/2019 for Medical Management of Chronic Issues, Hypertension, Diabetes, and Hypothyroidism   1. Type 2 diabetes mellitus with complication, without long-term current use of insulin ([place])   Meredith Shaw is a 73 y.o. female who presents for an follow up evaluation of Type 2 diabetes mellitus.  Current symptoms/problems include hyperglycemia and have been waxing and waning. Symptoms have been present for several days. Patient denies foot ulcerations, hypoglycemia , increased appetite, nausea, paresthesia of the feet, polydipsia, polyuria, visual disturbances, vomiting and weight loss.  Current diabetic medications include oral agent (monotherapy): Glucophage XR.  Compliant with meds - Yes  Current monitoring regimen: home blood tests - daily Home blood sugar records: pt did not bring today Any episodes of hypoglycemia? no  Known diabetic complications: retinopathy and cardiovascular disease Cardiovascular risk factors: advanced age (older than 80 for men, 59 for women), diabetes mellitus, dyslipidemia, hypertension, microalbuminuria, and sedentary lifestyle Eye exam current (within one year): yes Podiatry yearly?  No Weight trend: stable Prior visit with CDE: no Current diet: in general, a "healthy" diet  , well balanced Current exercise: walking  PNA Vaccine UTD?  Yes Hep B Vaccine?  No Tdap Vaccine UTD?  No  Is She on ACE inhibitor or angiotensin II receptor blocker?  Yes   lisinopril (Zestril)  Is She on statin? Yes pravastatin Is She on ASA 81 mg daily?  No   2. CKD stage 3 due to type 2 diabetes mellitus (HCC)  No change in urine output, no swelling / edema, confusion, fatigue, or weakness.   3. Hypertension associated with type 2 diabetes mellitus ([place])  Complaint with meds - Yes Current Medications - metoprolol, amlodipine, lisinoptil Checking BP at home ranging 120/80, 130/80 Exercising Regularly - Yes Watching Salt intake - Yes Pertinent ROS:  Headache - No Fatigue - No Visual Disturbances - Yes, has retinopathy, sees opthalmology every 4 weeks.  Chest pain - No Dyspnea - No Palpitations - No LE edema - No They report good compliance with medications and can restate their regimen by memory. No medication side effects.  Family, social, and smoking history reviewed.   BP Readings from Last 3 Encounters:  05/14/19 (!) 154/83  11/09/18 (!) 153/80  08/22/18 116/66   CMP Latest Ref Rng & Units 11/09/2018 08/22/2018 08/21/2018  Glucose 65 - 99 mg/dL 218(H) 177(H) 201(H)  BUN 8 - 27 mg/dL 25 22 16   Creatinine 0.57 - 1.00 mg/dL 1.14(H) 1.39(H) 1.20(H)  Sodium 134 - 144 mmol/L 140 138 138  Potassium 3.5 - 5.2 mmol/L 4.6 3.4(L) 3.5  Chloride 96 - 106 mmol/L 103 106 106  CO2 20 - 29 mmol/L 21 24 23   Calcium 8.7 - 10.3 mg/dL 9.6 8.6(L) 9.0  Total Protein 6.0 - 8.5 g/dL 6.3 - -  Total Bilirubin 0.0 - 1.2 mg/dL 0.2 - -  Alkaline Phos 39 - 117 IU/L 79 - -  AST 0 - 40 IU/L 14 - -  ALT 0 - 32 IU/L 14 - -  4. Postoperative hypothyroidism  Compliant with medications - Yes Current medications - Synthroid 137 mcg Adverse side effects - No Weight -  Filed Weights   05/14/19 0801  Weight: 174 lb (78.9 kg)  Stable Bowel habit changes - No Heat or cold intolerance - No Mood changes - No Changes in sleep habits - No Fatigue - No Skin, hair, or nail changes - No Tremor - No Palpitations - No Edema - No Shortness of breath - No Changes in  menses - N/A  Lab Results  Component Value Date   TSH 1.550 11/09/2018     5. Hyperlipidemia associated with type 2 diabetes mellitus ([place])  Compliant with medications - Yes Current medications - pravastatin Side effects from medications - No Diet - generally healthy Exercise - walking daily  Lab Results  Component Value Date   CHOL 152 11/09/2018   HDL 35 (L) 11/09/2018   LDLCALC 77 11/09/2018   TRIG 199 (H) 11/09/2018   CHOLHDL 4.3 11/09/2018     Family and personal medical history reviewed. Smoking and ETOH history reviewed.    6. 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 Coralyn Pear. Sees him every 4 weeks for injections. No increase in visual disturbances.      Relevant past medical, surgical, family, and social history reviewed and updated as indicated.  Allergies and medications reviewed and updated. Date reviewed: Chart in Epic.   Past Medical History:  Diagnosis Date  . Arthritis    OA AND PAIN BOTH KNEES -RIGHT KNEE HURTS WORSE THAN LEFT  . Cataract   . Diabetes mellitus    ORAL MEDICATION - NO INSULIN  . Hypertension   . Hypertensive retinopathy    OU  . Hypothyroidism   . Kidney stones    NONE AT PRESENT TIME THAT PT IS AWARE OF  . Osteoporosis     Past Surgical History:  Procedure Laterality Date  . ABDOMINAL HYSTERECTOMY    . CATARACT EXTRACTION    . CHOLECYSTECTOMY  1972  . COLONOSCOPY    . EYE SURGERY     RIGHT CATARACT EXTRACTON WITH LENS IMPLANT  . GROWTH REMOVED FROM THYROID    . INTRAMEDULLARY (IM) NAIL INTERTROCHANTERIC Left 02/10/2018   Procedure: INTRAMEDULLARY (IM) NAIL INTERTROCHANTRIC;  Surgeon: Mcarthur Rossetti, MD;  Location: [place];  Service: Orthopedics;  Laterality: Left;  . INTRAMEDULLARY (IM) NAIL INTERTROCHANTERIC Right 08/20/2018   Procedure: ORIF RIGHT HIP INTERTROC FRACTURE;  Surgeon: Mcarthur Rossetti, MD;  Location: [facility];   Service: Orthopedics;  Laterality: Right;  . left hip surgery   02/2018  . ORIF HIP FRACTURE Right 08/20/2018  . PERCUTANEOUS NEPHROLITHOTOMY    . TOTAL KNEE ARTHROPLASTY  06/09/2012   Procedure: TOTAL KNEE ARTHROPLASTY;  Surgeon: Mcarthur Rossetti, MD;  Location: [facility];  Service: Orthopedics;  Laterality: Right;  Right Total Knee Arthroplasty  . TOTAL KNEE ARTHROPLASTY Left 04/09/2014   Procedure: LEFT TOTAL KNEE ARTHROPLASTY;  Surgeon: Mcarthur Rossetti, MD;  Location: [place];  Service: Orthopedics;  Laterality: Left;  . TUBAL LIGATION      Social History   Socioeconomic History  . Marital status: Married    Spouse name: Berneta Sages   . Number of children: 2  . Years of education: Not on file  . Highest education level: Not on file  Occupational History  . Occupation: retired     Fish farm manager: [employer]    Comment: worked  10 years    Comment: bank - 45yr  Social Needs  . Financial resource strain: Not on file  . Food insecurity    Worry: Not on file    Inability: Not on file  . Transportation needs    Medical: Not on file    Non-medical: Not on file  Tobacco Use  . Smoking status: Never Smoker  . Smokeless tobacco: Never Used  Substance and Sexual Activity  . Alcohol use: No  . Drug use: No  . Sexual activity: Yes  Lifestyle  . Physical activity    Days per week: Not on file    Minutes per session: Not on file  . Stress: Not on file  Relationships  . Social [place]: Not on file    Gets together: Not on file    Attends religious service: Not on file    Active member of club or organization: Not on file    Attends meetings of clubs or organizations: Not on file    Relationship status: Not on file  . Intimate partner violence    Fear of current or ex partner: Not on file    Emotionally abused: Not on file    Physically abused: Not on file    Forced sexual activity: Not on file  Other Topics Concern  . Not on file  Social History Narrative   . Not on file    Outpatient Encounter Medications as of 05/14/2019  Medication Sig  . amLODipine (NORVASC) 5 MG tablet Take 1 tablet (5 mg total) by mouth daily.  . Blood Glucose Monitoring Suppl (ONETOUCH VERIO) w/Device KIT 1 each by Does not apply route daily.  . cholecalciferol (VITAMIN D) 1000 units tablet Take 5,000 Units by mouth daily.   .Marland Kitchendenosumab (PROLIA) 60 MG/ML SOSY injection Inject 60 mg into the skin every 6 (six) months.  .Marland Kitchenglucose blood (ONETOUCH VERIO) test strip Use as instructed  . Lancets (ONETOUCH ULTRASOFT) lancets Use as instructed  . levothyroxine (SYNTHROID) 137 MCG tablet TAKE ONE (1) TABLET EACH DAY  . metoprolol tartrate (LOPRESSOR) 100 MG tablet Take 1 tablet (100 mg total) by mouth 2 (two) times daily.  . naproxen sodium (ALEVE) 220 MG tablet Take 220 mg by mouth 2 (two) times daily as needed (for pain).  . pravastatin (PRAVACHOL) 40 MG tablet Take 1 tablet (40 mg total) by mouth at bedtime.  . [DISCONTINUED] amLODipine (NORVASC) 5 MG tablet Take 1 tablet (5 mg total) by mouth daily.  . [DISCONTINUED] levothyroxine (SYNTHROID, LEVOTHROID) 137 MCG tablet TAKE ONE (1) TABLET EACH DAY  . [DISCONTINUED] lisinopril (PRINIVIL,ZESTRIL) 10 MG tablet Take 2 tablets (20 mg total) by mouth daily for 30 days.  . [DISCONTINUED] lisinopril (ZESTRIL) 10 MG tablet Take 2 tablets (20 mg total) by mouth daily.  . [DISCONTINUED] metFORMIN (GLUCOPHAGE-XR) 500 MG 24 hr tablet Take 1 tablet (500 mg total) by mouth 2 (two) times daily.  . [DISCONTINUED] metFORMIN (GLUCOPHAGE-XR) 500 MG 24 hr tablet Take 1 tablet (500 mg total) by mouth 2 (two) times daily.  . [DISCONTINUED] metoprolol tartrate (LOPRESSOR) 100 MG tablet Take 1 tablet (100 mg total) by mouth 2 (two) times daily.  . [DISCONTINUED] pravastatin (PRAVACHOL) 40 MG tablet Take 1 tablet (40 mg total) by mouth at bedtime.  .Marland Kitchenlisinopril (ZESTRIL) 20 MG tablet Take 1 tablet (20 mg total) by mouth daily.  . metFORMIN (GLUMETZA)  1000 MG (MOD) 24 hr tablet Take 1 tablet (1,000 mg  total) by mouth 2 (two) times daily with a meal.   Facility-Administered Encounter Medications as of 05/14/2019  Medication  . Bevacizumab (AVASTIN) SOLN 1.25 mg  . Bevacizumab (AVASTIN) SOLN 1.25 mg  . denosumab (PROLIA) injection 60 mg    Allergies  Allergen Reactions  . Penicillins Itching and Rash    Review of Systems  Constitutional: Negative for activity change, appetite change, chills, fatigue and fever.  HENT: Negative.   Eyes: Positive for visual disturbance. Negative for photophobia.  Respiratory: Negative for cough, chest tightness, shortness of breath and wheezing.   Cardiovascular: Negative for chest pain, palpitations and leg swelling.  Gastrointestinal: Negative for abdominal pain, anal bleeding, blood in stool, constipation, diarrhea, nausea and vomiting.  Endocrine: Negative.  Negative for cold intolerance, heat intolerance, polydipsia, polyphagia and polyuria.  Genitourinary: Negative for decreased urine volume, difficulty urinating, dysuria, frequency and urgency.  Musculoskeletal: Positive for arthralgias (from hip fracture, healing well, doing great with recovery). Negative for myalgias.  Skin: Negative.  Negative for color change and pallor.  Allergic/Immunologic: Negative.   Neurological: Negative for dizziness, tremors, syncope, facial asymmetry, speech difficulty, weakness, light-headedness, numbness and headaches.  Hematological: Negative.  Does not bruise/bleed easily.  Psychiatric/Behavioral: Negative for confusion, decreased concentration, hallucinations, sleep disturbance and suicidal ideas. The patient is not nervous/anxious.   All other systems reviewed and are negative.       Objective:  BP (!) 154/83   Temp (!) 97.5 F (36.4 C) (Oral)   Ht 5' 7"  (1.702 m)   Wt 174 lb (78.9 kg)   BMI 27.25 kg/m    Wt Readings from Last 3 Encounters:  05/14/19 174 lb (78.9 kg)  11/09/18 174 lb (78.9 kg)   10/30/18 170 lb (77.1 kg)    Physical Exam Vitals signs and nursing note reviewed.  Constitutional:      General: She is not in acute distress.    Appearance: Normal appearance. She is well-developed, well-groomed and overweight. She is not ill-appearing, toxic-appearing or diaphoretic.  HENT:     Head: Normocephalic and atraumatic.     Jaw: There is normal jaw occlusion.     Right Ear: Hearing, tympanic membrane, ear canal and external ear normal.     Left Ear: Hearing, tympanic membrane, ear canal and external ear normal.     Nose: Nose normal.     Mouth/Throat:     Lips: Pink.     Mouth: Mucous membranes are moist.     Pharynx: Oropharynx is clear. Uvula midline.  Eyes:     General: Lids are normal.     Extraocular Movements: Extraocular movements intact.     Conjunctiva/sclera: Conjunctivae normal.     Pupils: Pupils are equal, round, and reactive to light.  Neck:     Musculoskeletal: Normal range of motion and neck supple.     Thyroid: No thyroid mass, thyromegaly or thyroid tenderness.     Vascular: No carotid bruit or JVD.     Trachea: Trachea and phonation normal.  Cardiovascular:     Rate and Rhythm: Normal rate and regular rhythm.     Chest Wall: PMI is not displaced.     Pulses: Normal pulses.     Heart sounds: Normal heart sounds. No murmur. No friction rub. No gallop.   Pulmonary:     Effort: Pulmonary effort is normal. No respiratory distress.     Breath sounds: Normal breath sounds. No wheezing.  Abdominal:     General: Bowel sounds are normal. There  is no distension or abdominal bruit.     Palpations: Abdomen is soft. There is no hepatomegaly or splenomegaly.     Tenderness: There is no abdominal tenderness. There is no right CVA tenderness or left CVA tenderness.     Hernia: No hernia is present.  Musculoskeletal:     Right hip: She exhibits decreased range of motion, decreased strength and tenderness. She exhibits no bony tenderness, no swelling, no  crepitus, no deformity and no laceration.     Lumbar back: Normal.     Right lower leg: No edema.     Left lower leg: No edema.     Comments: Using walker when out in public. No walker needed at home. Continued PT at home.   Lymphadenopathy:     Cervical: No cervical adenopathy.  Skin:    General: Skin is warm and dry.     Capillary Refill: Capillary refill takes less than 2 seconds.     Coloration: Skin is not cyanotic, jaundiced or pale.     Findings: No rash.  Neurological:     General: No focal deficit present.     Mental Status: She is alert and oriented to person, place, and time.     Cranial Nerves: Cranial nerves are intact.     Sensory: Sensation is intact.     Motor: Motor function is intact.     Coordination: Coordination is intact.     Gait: Gait is intact.     Deep Tendon Reflexes: Reflexes are normal and symmetric.  Psychiatric:        Attention and Perception: Attention and perception normal.        Mood and Affect: Mood and affect normal.        Speech: Speech normal.        Behavior: Behavior normal. Behavior is cooperative.        Thought Content: Thought content normal.        Cognition and Memory: Cognition and memory normal.        Judgment: Judgment normal.     Results for orders placed or performed in visit on 02/28/19  Cologuard  Result Value Ref Range   Cologuard Negative Negative       Pertinent labs & imaging results that were available during my care of the patient were reviewed by me and considered in my medical decision making.  Assessment & Plan:  Beau was seen today for medical management of chronic issues, hypertension, diabetes and hypothyroidism.  Diagnoses and all orders for this visit:  Type 2 diabetes mellitus with complication, without long-term current use of insulin (HCC) Lab Results  Component Value Date   HGBA1C 7.0 (H) 11/09/2018   HGBA1C 8.7 (H) 08/20/2018   HGBA1C 7.4 (H) 06/06/2018     Diabetes Control:  poor, A1C  8.8 today  Medications: increase metformin to 1000 mg twice daily Home glucose monitoring: daily Patient is currently taking a statin. Patient is taking an ACE-inhibitor/ARB. Pt is not taking ASA 81 mg daily.  Last foot exam: today Last diabetic eye exam: opthalmology every 4 weeks Urine Microalbumin/Creat Ratio: 156.7 Instruction/counseling given: reminded to bring blood glucose meter & log to each visit, reminded to bring medications to each visit, discussed foot care, discussed the need for weight loss, discussed diet and provided printed educational material       -     Bayer DCA Hb A1c Waived -     Cancel: Microalbumin / creatinine urine ratio -  Lipid panel -     CMP14+EGFR -     metFORMIN (GLUMETZA) 1000 MG (MOD) 24 hr tablet; Take 1 tablet (1,000 mg total) by mouth 2 (two) times daily with a meal.  CKD stage 3 due to type 2 diabetes mellitus ([place]) Labs pending. Report any new symptoms.  -     Cancel: Microalbumin / creatinine urine ratio -     CMP14+EGFR -     CBC with Differential/Platelet  Hypertension associated with type 2 diabetes mellitus ([place]) BP poorly controlled in office, well controlled at hoe.  No changes in regimen. Daily blood pressure log given with instructions on how to fill out and told to bring to next visit. Gaol BP 130/80. Pt aware to report any persistent high or low readings. DASH diet and exercise encouraged. Exercise at least 150 minutes per week and increase as tolerated. Goal BMI > 25. Stress management encouraged. Smoking cessation discussed. Avoid excessive alcohol. Avoid NSAID's. Avoid more than 2000 mg of sodium daily. Medications as prescribed. Follow up as scheduled.  -     Cancel: Microalbumin / creatinine urine ratio -     Lipid panel -     Thyroid Panel With TSH -     CMP14+EGFR -     CBC with Differential/Platelet -     amLODipine (NORVASC) 5 MG tablet; Take 1 tablet (5 mg total) by mouth daily. -     metoprolol tartrate (LOPRESSOR) 100 MG  tablet; Take 1 tablet (100 mg total) by mouth 2 (two) times daily. -     lisinopril (ZESTRIL) 20 MG tablet; Take 1 tablet (20 mg total) by mouth daily.  Postoperative hypothyroidism Thyroid disease has been well controlled. Labs are pending. Adjustments to regimen will be made if warranted. Make sure to take medications on an empty stomach with a full glass of water. Make sure to avoid vitamins or supplements for at least 4 hours before and 4 hours after taking medications. Repeat labs in 3 months if adjustments are made and in 6 months if stable.   -     Thyroid Panel With TSH -     levothyroxine (SYNTHROID) 137 MCG tablet; TAKE ONE (1) TABLET EACH DAY  Hyperlipidemia associated with type 2 diabetes mellitus ([place]) Diet encouraged - increase intake of fresh fruits and vegetables, increase intake of lean proteins. Bake, broil, or grill foods. Avoid fried, greasy, and fatty foods. Avoid fast foods. Increase intake of fiber-rich whole grains.  Exercise encouraged - at least 150 minutes per week and advance as tolerated.  Goal BMI < 25.  Medications as prescribed. Follow up in 3-6 months as discussed.  -     Lipid panel -     pravastatin (PRAVACHOL) 40 MG tablet; Take 1 tablet (40 mg total) by mouth at bedtime.  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  Follow up plan: Return in about 3 months (around 08/14/2019), or if symptoms worsen or fail to improve, for DM, HTN.  Continue healthy lifestyle choices, including diet (rich in fruits, vegetables, and lean proteins, and low in salt and simple carbohydrates) and exercise (at least 30 minutes of moderate physical activity daily).  Educational handout given for Survey, DM, COVID-19  The above assessment and management plan was discussed with the patient. The patient verbalized understanding of and has agreed to the management  plan. Patient is aware to call  the clinic if symptoms persist or worsen. Patient is aware when to return to the clinic for a follow-up visit. Patient educated on when it is appropriate to go to the emergency department.   Monia Pouch, FNP-C [facility] [phone] 05/14/19

## 2019-05-14 NOTE — Telephone Encounter (Signed)
Insurance verfication submitted for [employer]

## 2019-05-15 ENCOUNTER — Other Ambulatory Visit: Payer: Self-pay

## 2019-05-15 ENCOUNTER — Ambulatory Visit: Payer: Medicare Other | Admitting: Family Medicine

## 2019-05-15 LAB — CBC WITH DIFFERENTIAL/PLATELET
Basophils Absolute: 0.1 10*3/uL (ref 0.0–0.2)
Basos: 1 %
EOS (ABSOLUTE): 0.2 10*3/uL (ref 0.0–0.4)
Eos: 3 %
Hematocrit: 38.1 % (ref 34.0–46.6)
Hemoglobin: 12.8 g/dL (ref 11.1–15.9)
Immature Grans (Abs): 0.1 10*3/uL (ref 0.0–0.1)
Immature Granulocytes: 1 %
Lymphocytes Absolute: 2.3 10*3/uL (ref 0.7–3.1)
Lymphs: 27 %
MCH: 30.3 pg (ref 26.6–33.0)
MCHC: 33.6 g/dL (ref 31.5–35.7)
MCV: 90 fL (ref 79–97)
Monocytes Absolute: 0.7 10*3/uL (ref 0.1–0.9)
Monocytes: 8 %
Neutrophils Absolute: 5.3 10*3/uL (ref 1.4–7.0)
Neutrophils: 60 %
Platelets: 228 10*3/uL (ref 150–450)
RBC: 4.22 x10E6/uL (ref 3.77–5.28)
RDW: 13.8 % (ref 11.7–15.4)
WBC: 8.6 10*3/uL (ref 3.4–10.8)

## 2019-05-15 LAB — CMP14+EGFR
ALT: 14 IU/L (ref 0–32)
AST: 14 IU/L (ref 0–40)
Albumin/Globulin Ratio: 1.6 (ref 1.2–2.2)
Albumin: 3.9 g/dL (ref 3.7–4.7)
Alkaline Phosphatase: 59 IU/L (ref 39–117)
BUN/Creatinine Ratio: 22 (ref 12–28)
BUN: 30 mg/dL — ABNORMAL HIGH (ref 8–27)
Bilirubin Total: 0.2 mg/dL (ref 0.0–1.2)
CO2: 19 mmol/L — ABNORMAL LOW (ref 20–29)
Calcium: 9.4 mg/dL (ref 8.7–10.3)
Chloride: 106 mmol/L (ref 96–106)
Creatinine, Ser: 1.39 mg/dL — ABNORMAL HIGH (ref 0.57–1.00)
GFR calc Af Amer: 44 mL/min/{1.73_m2} — ABNORMAL LOW (ref >59)
GFR calc non Af Amer: 38 mL/min/{1.73_m2} — ABNORMAL LOW (ref >59)
Globulin, Total: 2.5 g/dL (ref 1.5–4.5)
Glucose: 247 mg/dL — ABNORMAL HIGH (ref 65–99)
Potassium: 4.3 mmol/L (ref 3.5–5.2)
Sodium: 141 mmol/L (ref 134–144)
Total Protein: 6.4 g/dL (ref 6.0–8.5)

## 2019-05-15 LAB — LIPID PANEL
Chol/HDL Ratio: 4.7 ratio — ABNORMAL HIGH (ref 0.0–4.4)
Cholesterol, Total: 166 mg/dL (ref 100–199)
HDL: 35 mg/dL — ABNORMAL LOW (ref >39)
LDL Calculated: 79 mg/dL (ref 0–99)
Triglycerides: 259 mg/dL — ABNORMAL HIGH (ref 0–149)
VLDL Cholesterol Cal: 52 mg/dL — ABNORMAL HIGH (ref 5–40)

## 2019-05-15 LAB — THYROID PANEL WITH TSH
Free Thyroxine Index: 3.1 (ref 1.2–4.9)
T3 Uptake Ratio: 32 % (ref 24–39)
T4, Total: 9.8 ug/dL (ref 4.5–12.0)
TSH: 0.177 u[IU]/mL — ABNORMAL LOW (ref 0.450–4.500)

## 2019-05-15 MED ORDER — LEVOTHYROXINE SODIUM 137 MCG PO TABS: 137.0000 ug | ORAL_TABLET | ORAL | 3 refills | Status: DC

## 2019-05-15 MED ORDER — ICOSAPENT ETHYL 1 G PO CAPS: 2.0000 g | ORAL_CAPSULE | Freq: Two times a day (BID) | ORAL | 3 refills | Status: DC

## 2019-05-15 MED ORDER — LEVOTHYROXINE SODIUM 112 MCG PO TABS: 112.0000 ug | ORAL_TABLET | ORAL | 3 refills | Status: DC

## 2019-05-15 NOTE — Addendum Note (Signed)
Addended by: Baruch Gouty on: 05/15/2019 01:24 PM   Modules accepted: Orders

## 2019-05-16 ENCOUNTER — Ambulatory Visit: Payer: Medicare Other | Admitting: *Deleted

## 2019-05-16 NOTE — Progress Notes (Signed)
Erroneous encounter Pt too early for inj

## 2019-05-17 ENCOUNTER — Ambulatory Visit (INDEPENDENT_AMBULATORY_CARE_PROVIDER_SITE_OTHER): Payer: Medicare Other | Admitting: *Deleted

## 2019-05-17 ENCOUNTER — Other Ambulatory Visit: Payer: Self-pay

## 2019-05-17 DIAGNOSIS — M8000XD Age-related osteoporosis with current pathological fracture, unspecified site, subsequent encounter for fracture with routine healing: Secondary | ICD-10-CM

## 2019-05-17 DIAGNOSIS — M810 Age-related osteoporosis without current pathological fracture: Secondary | ICD-10-CM

## 2019-05-17 NOTE — Progress Notes (Signed)
Pt given Prolia inj Tolerated well Buy and bill 

## 2019-05-31 ENCOUNTER — Encounter: Payer: Self-pay | Admitting: *Deleted

## 2019-06-04 ENCOUNTER — Encounter: Payer: Self-pay | Admitting: Family Medicine

## 2019-06-07 NOTE — Progress Notes (Addendum)
[facility] Note  06/08/2019     CHIEF COMPLAINT Patient presents for Retina Follow Up   HISTORY OF PRESENT ILLNESS: Meredith Shaw is a 73 y.o. female who presents to the clinic today for:   HPI    Retina Follow Up    Patient presents with  CRVO/BRVO.  In right eye.  This started weeks ago.  Severity is moderate.  Duration of weeks.  Since onset it is stable.  I, the attending physician,  performed the HPI with the patient and updated documentation appropriately.          Comments    Patient states vision is doing well.  She denies eye pain or discomfort and denies any new or worsening floaters or fol OU.       Last edited by Bernarda Caffey, MD on 06/08/2019  8:05 AM. (History)    pt states she is doing well, she states she has gotten new glasses since her last visit  Referring physician: Baruch Gouty, [place],  [place] [address]  HISTORICAL INFORMATION:   Selected notes from the MEDICAL RECORD NUMBER Referred by Dr. Maryjane Hurter for DM exam LEE: 01.16.20 (M. Le) [BCVA: OD: 20/40 OS: 20/50-3] Ocular Hx-PDR OU, macular edema OU, cataracts OU PMH-DM (takes metformin), arthritis, HTN, hypothyroidism    CURRENT MEDICATIONS: No current outpatient medications on file. (Ophthalmic Drugs)   No current facility-administered medications for this visit.  (Ophthalmic Drugs)   Current Outpatient Medications (Other)  Medication Sig  . amLODipine (NORVASC) 5 MG tablet Take 1 tablet (5 mg total) by mouth daily.  . Blood Glucose Monitoring Suppl (ONETOUCH VERIO) w/Device KIT 1 each by Does not apply route daily.  . cholecalciferol (VITAMIN D) 1000 units tablet Take 5,000 Units by mouth daily.   Marland Kitchen denosumab (PROLIA) 60 MG/ML SOSY injection Inject 60 mg into the skin every 6 (six) months.  Marland Kitchen glucose blood (ONETOUCH VERIO) test strip Use as instructed  . Icosapent Ethyl 1 g CAPS Take 2 capsules (2 g total) by mouth 2 (two) times daily with a  meal.  . Lancets (ONETOUCH ULTRASOFT) lancets Use as instructed  . levothyroxine (SYNTHROID) 112 MCG tablet Take 1 tablet (112 mcg total) by mouth every other day. Before breakfast  . levothyroxine (SYNTHROID) 137 MCG tablet Take 1 tablet (137 mcg total) by mouth every other day. Before breakfast  . lisinopril (ZESTRIL) 20 MG tablet Take 1 tablet (20 mg total) by mouth daily.  . metFORMIN (GLUMETZA) 1000 MG (MOD) 24 hr tablet Take 1 tablet (1,000 mg total) by mouth 2 (two) times daily with a meal.  . metoprolol tartrate (LOPRESSOR) 100 MG tablet Take 1 tablet (100 mg total) by mouth 2 (two) times daily.  . naproxen sodium (ALEVE) 220 MG tablet Take 220 mg by mouth 2 (two) times daily as needed (for pain).  . pravastatin (PRAVACHOL) 40 MG tablet Take 1 tablet (40 mg total) by mouth at bedtime.   Current Facility-Administered Medications (Other)  Medication Route  . Bevacizumab (AVASTIN) SOLN 1.25 mg Intravitreal  . Bevacizumab (AVASTIN) SOLN 1.25 mg Intravitreal  . denosumab (PROLIA) injection 60 mg Subcutaneous      REVIEW OF SYSTEMS: ROS    Positive for: Genitourinary, Musculoskeletal, Endocrine, Eyes   Negative for: Constitutional, Gastrointestinal, Neurological, Skin, HENT, Cardiovascular, Respiratory, Psychiatric, Allergic/Imm, Heme/Lymph   Last edited by Doneen Poisson on 06/08/2019  7:51 AM. (History)  ALLERGIES Allergies  Allergen Reactions  . Penicillins Itching and Rash    PAST MEDICAL HISTORY Past Medical History:  Diagnosis Date  . Arthritis    OA AND PAIN BOTH KNEES -RIGHT KNEE HURTS WORSE THAN LEFT  . Cataract   . Diabetes mellitus    ORAL MEDICATION - NO INSULIN  . Hypertension   . Hypertensive retinopathy    OU  . Hypothyroidism   . Kidney stones    NONE AT PRESENT TIME THAT PT IS AWARE OF  . Osteoporosis    Past Surgical History:  Procedure Laterality Date  . ABDOMINAL HYSTERECTOMY    . CATARACT EXTRACTION    . CHOLECYSTECTOMY  1972  .  COLONOSCOPY    . EYE SURGERY     RIGHT CATARACT EXTRACTON WITH LENS IMPLANT  . GROWTH REMOVED FROM THYROID    . INTRAMEDULLARY (IM) NAIL INTERTROCHANTERIC Left 02/10/2018   Procedure: INTRAMEDULLARY (IM) NAIL INTERTROCHANTRIC;  Surgeon: Mcarthur Rossetti, MD;  Location: [place];  Service: Orthopedics;  Laterality: Left;  . INTRAMEDULLARY (IM) NAIL INTERTROCHANTERIC Right 08/20/2018   Procedure: ORIF RIGHT HIP INTERTROC FRACTURE;  Surgeon: Mcarthur Rossetti, MD;  Location: [place];  Service: Orthopedics;  Laterality: Right;  . left hip surgery   02/2018  . ORIF HIP FRACTURE Right 08/20/2018  . PERCUTANEOUS NEPHROLITHOTOMY    . TOTAL KNEE ARTHROPLASTY  06/09/2012   Procedure: TOTAL KNEE ARTHROPLASTY;  Surgeon: Mcarthur Rossetti, MD;  Location: [facility];  Service: Orthopedics;  Laterality: Right;  Right Total Knee Arthroplasty  . TOTAL KNEE ARTHROPLASTY Left 04/09/2014   Procedure: LEFT TOTAL KNEE ARTHROPLASTY;  Surgeon: Mcarthur Rossetti, MD;  Location: [place];  Service: Orthopedics;  Laterality: Left;  . TUBAL LIGATION      FAMILY HISTORY Family History  Problem Relation Age of Onset  . Heart disease Mother   . Breast cancer Mother        79's  . Stroke Father   . Heart disease Father   . Healthy Daughter   . Healthy Son   . Liver disease Maternal Grandmother        gall stones imbedded in liver - blead to death when in surgery   . Pneumonia Maternal Grandfather   . Diabetes Paternal Grandmother     SOCIAL HISTORY Social History   Tobacco Use  . Smoking status: Never Smoker  . Smokeless tobacco: Never Used  Substance Use Topics  . Alcohol use: No  . Drug use: No         OPHTHALMIC EXAM:  Base Eye Exam    Visual Acuity (Snellen - Linear)      Right Left   Dist cc 20/20 -3 20/20 -2   Correction: Glasses       Tonometry (Tonopen, 7:54 AM)      Right Left   Pressure 14 15       Pupils      Dark Light Shape React APD   Right 4 3 Round Brisk 0    Left 4 3 Round Brisk 0       Visual Fields      Left Right    Full Full       Extraocular Movement      Right Left    Full Full       Neuro/Psych    Oriented x3: Yes   Mood/Affect: Normal       Dilation    Both eyes: 1.0% Mydriacyl, 2.5% Phenylephrine @ 7:54  AM        Slit Lamp and Fundus Exam    Slit Lamp Exam      Right Left   Lids/Lashes Dermatochalasis - upper lid, Meibomian gland dysfunction Dermatochalasis - upper lid, Meibomian gland dysfunction   Conjunctiva/Sclera White and quiet White and quiet   Cornea 1+ Punctate epithelial erosions, Debris in tear film - improved, +ABMD 1+ Punctate epithelial erosions, Debris in tear film   Anterior Chamber deep and clear deep and clear, narrow temporal angle   Iris Round and dilated, No NVI Round and dilated, No NVI   Lens PC IOL in good position, trace Posterior capsular opacification PC IOL in good position with open PC   Vitreous mild Vitreous syneresis, Posterior vitreous detachment, silicone oil bubble mild Vitreous syneresis       Fundus Exam      Right Left   Disc compact, mild tilt, temporal PPA, mild pallor compact, Pink and Sharp, temporal Peripapillary atrophy   C/D Ratio 0.1 0.2   Macula good foveal reflex, persistent edema, improving IRH and CWS superior macula Flat, good foveal reflex, Retinal pigment epithelial mottling, No edema, rare MA temporal macula   Vessels Vascular attenuation, Tortuous Vascular attenuation, Tortuous   Periphery Attached, scattered reticular degeneration, temporal IRH resolving Attached, reticular degeneration, mild, scattered MA, focal pigment clumping temporal periphery        Refraction    Wearing Rx      Sphere Cylinder Axis Add   Right [place] +0.50 020 +2.50   Left [place]   +2.50   Type: prog          IMAGING AND PROCEDURES  Imaging and Procedures for @TODAY @  OCT, Retina - OU - Both Eyes       Right Eye Quality was good. Central Foveal Thickness: 288. Progression  has improved. Findings include abnormal foveal contour, no SRF, intraretinal fluid, cystoid macular edema (Mild interval improvement in foveal profile and superior IRF/IRHM).   Left Eye Quality was good. Central Foveal Thickness: 286. Progression has been stable. Findings include normal foveal contour, no IRF, no SRF, vitreomacular adhesion .   Notes *Images captured and stored on drive  Diagnosis / Impression:  OD: superior BRVO w/ CME -- Mild interval improvement in foveal profile and superior IRF/IRHM OS: NFP; no IRF/SRF--stable  Clinical management:  See below  Abbreviations: NFP - Normal foveal profile. CME - cystoid macular edema. PED - pigment epithelial detachment. IRF - intraretinal fluid. SRF - subretinal fluid. EZ - ellipsoid zone. ERM - epiretinal membrane. ORA - outer retinal atrophy. ORT - outer retinal tubulation. SRHM - subretinal hyper-reflective material        Intravitreal Injection, Pharmacologic Agent - OD - Right Eye       Time Out 06/08/2019. 8:07 AM. Confirmed correct patient, procedure, site, and patient consented.   Anesthesia Topical anesthesia was used. Anesthetic medications included Lidocaine 2%, Proparacaine 0.5%.   Procedure Preparation included 5% betadine to ocular surface, eyelid speculum. A supplied needle was used.   Injection:  1.25 mg Bevacizumab (AVASTIN) SOLN   NDC: 69629-528-41, Lot: [id number] , Expiration date: 07/25/2019   Route: Intravitreal, Site: Right Eye, Waste: 0 mg  Post-op Post injection exam found visual acuity of at least counting fingers. The patient tolerated the procedure well. There were no complications. The patient received written and verbal post procedure care education.                 ASSESSMENT/PLAN:    ICD-10-CM  1. Branch retinal vein occlusion of right eye with macular edema  H34.8310 Intravitreal Injection, Pharmacologic Agent - OD - Right Eye    Bevacizumab (AVASTIN) SOLN 1.25 mg  2. Retinal  edema  H35.81 OCT, Retina - OU - Both Eyes  3. 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 Essential hypertension  I10   5. Hypertensive retinopathy of both eyes  H35.033   6. Pseudophakia of both eyes  Z96.1     1,2. BRVO w/ CME OD - s/p IVA OD #1 (01.31.20), #2 (02.28.20), #3 (05.08.20), #4 (06.05.20), #5 (07.03.20), #6 (07.31.20) - delayed follow up to 10 weeks instead of four from 2.28 to 5.08 due to COVID-19 restrictions - FA (06.05.20) shows focal area of vascular non-perfusion and telangiectatic vessels superior macula -- consistent with BRVO - BCVA improved to 20/20 - OCT shows mild interval improvement in foveal profile and superior IRF/IRHM - recommend IVA OD #7today, 08.28.20 with extension to 5 weeks - RBA of procedure discussed, questions answered - informed consent obtained and signed - see procedure note - F/U 5 weeks -- DFE/OCT/possible injectoin  3. Mild to moderate nonproliferative diabetic retinopathy OU  - The incidence, risk factors for progression, natural history and treatment options for diabetic retinopathy were discussed with patient.    - The need for close monitoring of blood glucose, blood pressure, and serum lipids, avoiding cigarette or any type of tobacco, and the need for long term follow up was also discussed with patient.  - exam with rare, scattered IRH/MA OU  - FA (06.05.20) shows leaking microaneurysms OU; no NV OU  - OCT shows interval improvement in IRF OD (BRVO w/ CME as above)  - monitor  4,5. Hypertensive retinopathy OU  - discussed importance of tight BP control  - monitor  6. Pseudophakia OU  - s/p CE/IOL OU  - OD: done at [facility] around 2010; OS: Dr. Nancy Fetter (May 2020)  - beautiful surgeries, doing well  - completed  post op regimen OS and updated glasses as of last visit  - monitor    Ophthalmic Meds Ordered this visit:  Meds ordered this encounter  Medications  . Bevacizumab (AVASTIN) SOLN 1.25 mg       Return in about 5 weeks (around 07/13/2019) for BRVO OD - Dilated Exam, OCT.  There are no Patient Instructions on file for this visit.   Explained the diagnoses, plan, and follow up with the patient and they expressed understanding.  Patient expressed understanding of the importance of proper follow up care.   This document serves as a record of services personally performed by Gardiner Sleeper, MD, PhD. It was created on their behalf by Ernest Mallick, OA, an ophthalmic assistant. The creation of this record is the provider's dictation and/or activities during the visit.    Electronically signed by: Ernest Mallick, OA 08.27.2020 9:11 AM    Gardiner Sleeper, M.D., Ph.D. Diseases & Surgery of the Retina and Vitreous [facility]  I have reviewed the above documentation for accuracy and completeness, and I agree with the above. Gardiner Sleeper, M.D., Ph.D. 06/08/19 9:11 AM     Abbreviations: M myopia (nearsighted); A astigmatism; H hyperopia (farsighted); P presbyopia; Mrx spectacle prescription;  CTL contact lenses; OD right eye; OS left eye; OU both eyes  XT exotropia; ET esotropia; PEK punctate epithelial keratitis; PEE punctate epithelial erosions; DES dry  eye syndrome; MGD meibomian gland dysfunction; ATs artificial tears; PFAT's preservative free artificial tears; [place] nuclear sclerotic cataract; PSC posterior subcapsular cataract; ERM epi-retinal membrane; PVD posterior vitreous detachment; RD retinal detachment; DM diabetes mellitus; DR diabetic retinopathy; NPDR non-proliferative diabetic retinopathy; PDR proliferative diabetic retinopathy; CSME clinically significant macular edema; DME diabetic macular edema; dbh dot blot hemorrhages; CWS cotton wool spot; POAG primary  open angle glaucoma; C/D cup-to-disc ratio; HVF humphrey visual field; GVF goldmann visual field; OCT optical coherence tomography; IOP intraocular pressure; BRVO Branch retinal vein occlusion; CRVO central retinal vein occlusion; CRAO central retinal artery occlusion; BRAO branch retinal artery occlusion; RT retinal tear; SB scleral buckle; PPV pars plana vitrectomy; VH Vitreous hemorrhage; PRP panretinal laser photocoagulation; IVK intravitreal kenalog; VMT vitreomacular traction; MH Macular hole;  NVD neovascularization of the disc; NVE neovascularization elsewhere; AREDS age related eye disease study; ARMD age related macular degeneration; POAG primary open angle glaucoma; EBMD epithelial/anterior basement membrane dystrophy; ACIOL anterior chamber intraocular lens; IOL intraocular lens; PCIOL posterior chamber intraocular lens; Phaco/IOL phacoemulsification with intraocular lens placement; [place] photorefractive keratectomy; LASIK laser assisted in situ keratomileusis; HTN hypertension; DM diabetes mellitus; COPD chronic obstructive pulmonary disease

## 2019-06-08 ENCOUNTER — Ambulatory Visit (INDEPENDENT_AMBULATORY_CARE_PROVIDER_SITE_OTHER): Payer: Medicare Other | Admitting: Ophthalmology

## 2019-06-08 ENCOUNTER — Other Ambulatory Visit: Payer: Self-pay

## 2019-06-08 ENCOUNTER — Encounter (INDEPENDENT_AMBULATORY_CARE_PROVIDER_SITE_OTHER): Payer: Self-pay | Admitting: Ophthalmology

## 2019-06-08 DIAGNOSIS — E113393 Type 2 diabetes mellitus with moderate nonproliferative diabetic retinopathy without macular edema, bilateral: Secondary | ICD-10-CM | POA: Diagnosis not present

## 2019-06-08 DIAGNOSIS — H3581 Retinal edema: Secondary | ICD-10-CM

## 2019-06-08 DIAGNOSIS — Z961 Presence of intraocular lens: Secondary | ICD-10-CM

## 2019-06-08 DIAGNOSIS — H34831 Tributary (branch) retinal vein occlusion, right eye, with macular edema: Secondary | ICD-10-CM

## 2019-06-08 DIAGNOSIS — H35033 Hypertensive retinopathy, bilateral: Secondary | ICD-10-CM

## 2019-06-08 DIAGNOSIS — I10 Essential (primary) hypertension: Secondary | ICD-10-CM | POA: Diagnosis not present

## 2019-06-08 MED ORDER — BEVACIZUMAB CHEMO INJECTION 1.25MG/0.05ML SYRINGE FOR KALEIDOSCOPE
1.2500 mg | INTRAVITREAL | Status: AC | PRN
  Administered 2019-06-08: 1.25 mg via INTRAVITREAL

## 2019-07-11 NOTE — Progress Notes (Addendum)
[facility] Note  07/13/2019     CHIEF COMPLAINT Patient presents for Retina Follow Up   HISTORY OF PRESENT ILLNESS: Meredith Shaw is a 73 y.o. female who presents to the clinic today for:   HPI    Retina Follow Up    Patient presents with  Other.  In right eye.  Severity is mild.  Duration of 5 weeks.  Since onset it is stable.  I, the attending physician,  performed the HPI with the patient and updated documentation appropriately.          Comments    5 Week Retina follow up for BRVO OD. Patient states no vision changes noticed since last visit.        Last edited by Bernarda Caffey, MD on 07/13/2019  8:43 AM. (History)    pt states she feels like the vision in her right eye is better than last time  Referring physician: Baruch Gouty, [place],  [place] [address]  HISTORICAL INFORMATION:   Selected notes from the MEDICAL RECORD NUMBER Referred by Dr. Maryjane Hurter for DM exam LEE: 01.16.20 (M. Le) [BCVA: OD: 20/40 OS: 20/50-3] Ocular Hx-PDR OU, macular edema OU, cataracts OU PMH-DM (takes metformin), arthritis, HTN, hypothyroidism    CURRENT MEDICATIONS: No current outpatient medications on file. (Ophthalmic Drugs)   No current facility-administered medications for this visit.  (Ophthalmic Drugs)   Current Outpatient Medications (Other)  Medication Sig  . amLODipine (NORVASC) 5 MG tablet Take 1 tablet (5 mg total) by mouth daily.  . Blood Glucose Monitoring Suppl (ONETOUCH VERIO) w/Device KIT 1 each by Does not apply route daily.  . cholecalciferol (VITAMIN D) 1000 units tablet Take 5,000 Units by mouth daily.   Marland Kitchen denosumab (PROLIA) 60 MG/ML SOSY injection Inject 60 mg into the skin every 6 (six) months.  Marland Kitchen glucose blood (ONETOUCH VERIO) test strip Use as instructed  . Icosapent Ethyl 1 g CAPS Take 2 capsules (2 g total) by mouth 2 (two) times daily with a meal.  . Lancets (ONETOUCH ULTRASOFT) lancets Use as instructed  .  lisinopril (ZESTRIL) 20 MG tablet Take 1 tablet (20 mg total) by mouth daily.  . metFORMIN (GLUMETZA) 1000 MG (MOD) 24 hr tablet Take 1 tablet (1,000 mg total) by mouth 2 (two) times daily with a meal.  . metoprolol tartrate (LOPRESSOR) 100 MG tablet Take 1 tablet (100 mg total) by mouth 2 (two) times daily.  . naproxen sodium (ALEVE) 220 MG tablet Take 220 mg by mouth 2 (two) times daily as needed (for pain).  . pravastatin (PRAVACHOL) 40 MG tablet Take 1 tablet (40 mg total) by mouth at bedtime.  Marland Kitchen levothyroxine (SYNTHROID) 112 MCG tablet Take 1 tablet (112 mcg total) by mouth every other day. Before breakfast  . levothyroxine (SYNTHROID) 137 MCG tablet Take 1 tablet (137 mcg total) by mouth every other day. Before breakfast   Current Facility-Administered Medications (Other)  Medication Route  . Bevacizumab (AVASTIN) SOLN 1.25 mg Intravitreal  . Bevacizumab (AVASTIN) SOLN 1.25 mg Intravitreal  . denosumab (PROLIA) injection 60 mg Subcutaneous      REVIEW OF SYSTEMS: ROS    Positive for: Genitourinary, Musculoskeletal, Endocrine, Eyes   Negative for: Constitutional, Gastrointestinal, Neurological, Skin, HENT, Cardiovascular, Respiratory, Psychiatric, Allergic/Imm, Heme/Lymph   Last edited by Elmore Guise, COT on 07/13/2019  8:31 AM. (History)       ALLERGIES Allergies  Allergen Reactions  . Penicillins Itching  and Rash    PAST MEDICAL HISTORY Past Medical History:  Diagnosis Date  . Arthritis    OA AND PAIN BOTH KNEES -RIGHT KNEE HURTS WORSE THAN LEFT  . Cataract   . Diabetes mellitus    ORAL MEDICATION - NO INSULIN  . Hypertension   . Hypertensive retinopathy    OU  . Hypothyroidism   . Kidney stones    NONE AT PRESENT TIME THAT PT IS AWARE OF  . Osteoporosis    Past Surgical History:  Procedure Laterality Date  . ABDOMINAL HYSTERECTOMY    . CATARACT EXTRACTION    . CHOLECYSTECTOMY  1972  . COLONOSCOPY    . EYE SURGERY     RIGHT CATARACT EXTRACTON WITH LENS  IMPLANT  . GROWTH REMOVED FROM THYROID    . INTRAMEDULLARY (IM) NAIL INTERTROCHANTERIC Left 02/10/2018   Procedure: INTRAMEDULLARY (IM) NAIL INTERTROCHANTRIC;  Surgeon: Mcarthur Rossetti, MD;  Location: [facility];  Service: Orthopedics;  Laterality: Left;  . INTRAMEDULLARY (IM) NAIL INTERTROCHANTERIC Right 08/20/2018   Procedure: ORIF RIGHT HIP INTERTROC FRACTURE;  Surgeon: Mcarthur Rossetti, MD;  Location: [facility];  Service: Orthopedics;  Laterality: Right;  . left hip surgery   02/2018  . ORIF HIP FRACTURE Right 08/20/2018  . PERCUTANEOUS NEPHROLITHOTOMY    . TOTAL KNEE ARTHROPLASTY  06/09/2012   Procedure: TOTAL KNEE ARTHROPLASTY;  Surgeon: Mcarthur Rossetti, MD;  Location: [facility];  Service: Orthopedics;  Laterality: Right;  Right Total Knee Arthroplasty  . TOTAL KNEE ARTHROPLASTY Left 04/09/2014   Procedure: LEFT TOTAL KNEE ARTHROPLASTY;  Surgeon: Mcarthur Rossetti, MD;  Location: [facility];  Service: Orthopedics;  Laterality: Left;  . TUBAL LIGATION      FAMILY HISTORY Family History  Problem Relation Age of Onset  . Heart disease Mother   . Breast cancer Mother        35's  . Stroke Father   . Heart disease Father   . Healthy Daughter   . Healthy Son   . Liver disease Maternal Grandmother        gall stones imbedded in liver - blead to death when in surgery   . Pneumonia Maternal Grandfather   . Diabetes Paternal Grandmother     SOCIAL HISTORY Social History   Tobacco Use  . Smoking status: Never Smoker  . Smokeless tobacco: Never Used  Substance Use Topics  . Alcohol use: No  . Drug use: No         OPHTHALMIC EXAM:  Base Eye Exam    Visual Acuity (Snellen - Linear)      Right Left   Dist cc 20/25-2 20/20-1   Dist ph cc 20/NI    Correction: Glasses       Tonometry (Tonopen, 8:33 AM)      Right Left   Pressure 17 15       Pupils      Dark Light Shape React APD   Right 3 2 Round Brisk None   Left 3 2 Round Brisk None       Visual Fields  (Counting fingers)      Left Right    Full Full       Extraocular Movement      Right Left    Full, Ortho Full, Ortho       Neuro/Psych    Oriented x3: Yes   Mood/Affect: Normal       Dilation    Both eyes: 1.0% Mydriacyl, 2.5% Phenylephrine @ 8:33  AM        Slit Lamp and Fundus Exam    Slit Lamp Exam      Right Left   Lids/Lashes Dermatochalasis - upper lid, Meibomian gland dysfunction Dermatochalasis - upper lid, Meibomian gland dysfunction   Conjunctiva/Sclera White and quiet White and quiet   Cornea 1+ Punctate epithelial erosions, Debris in tear film - improved, +ABMD 1+ Punctate epithelial erosions, Debris in tear film   Anterior Chamber deep and clear deep and clear, narrow temporal angle   Iris Round and dilated, No NVI Round and dilated, No NVI   Lens PC IOL in good position, trace Posterior capsular opacification PC IOL in good position with open PC   Vitreous mild Vitreous syneresis, Posterior vitreous detachment, silicone oil bubble mild Vitreous syneresis       Fundus Exam      Right Left   Disc compact, mild tilt, temporal PPA, mild pallor compact, Pink and Sharp, temporal Peripapillary atrophy   C/D Ratio 0.1 0.2   Macula blunted foveal reflex, increased edema, improving IRH and CWS superior macula Flat, good foveal reflex, Retinal pigment epithelial mottling, No edema, rare MA temporal macula   Vessels Vascular attenuation, Tortuous Vascular attenuation, Tortuous   Periphery Attached, scattered reticular degeneration, temporal IRH resolving Attached, reticular degeneration, mild, scattered MA, focal pigment clumping temporal periphery        Refraction    Wearing Rx      Sphere Cylinder Axis Add   Right [place] +0.50 020 +2.50   Left [place]   +2.50   Type: prog          IMAGING AND PROCEDURES  Imaging and Procedures for @TODAY @  OCT, Retina - OU - Both Eyes       Right Eye Quality was good. Central Foveal Thickness: 332. Progression has  worsened. Findings include abnormal foveal contour, no SRF, intraretinal fluid, cystoid macular edema (Mild interval increase in IRF/IRHM).   Left Eye Quality was good. Central Foveal Thickness: 281. Progression has been stable. Findings include normal foveal contour, no IRF, no SRF, vitreomacular adhesion .   Notes *Images captured and stored on drive  Diagnosis / Impression:  OD: superior BRVO w/ CME -- Mild interval increase in IRF/IRHM OS: NFP; no IRF/SRF--stable  Clinical management:  See below  Abbreviations: NFP - Normal foveal profile. CME - cystoid macular edema. PED - pigment epithelial detachment. IRF - intraretinal fluid. SRF - subretinal fluid. EZ - ellipsoid zone. ERM - epiretinal membrane. ORA - outer retinal atrophy. ORT - outer retinal tubulation. SRHM - subretinal hyper-reflective material        Intravitreal Injection, Pharmacologic Agent - OD - Right Eye       Time Out 07/13/2019. 8:33 AM. Confirmed correct patient, procedure, site, and patient consented.   Anesthesia Topical anesthesia was used. Anesthetic medications included Lidocaine 2%, Proparacaine 0.5%.   Procedure Preparation included 5% betadine to ocular surface, eyelid speculum. A supplied needle was used.   Injection:  1.25 mg Bevacizumab (AVASTIN) SOLN   NDC: 51761-607-37, Lot: [id number] , Expiration date: 09/27/2019   Route: Intravitreal, Site: Right Eye, Waste: 0 mL  Post-op Post injection exam found visual acuity of at least counting fingers. The patient tolerated the procedure well. There were no complications. The patient received written and verbal post procedure care education.                 ASSESSMENT/PLAN:    ICD-10-CM   1. Branch retinal vein occlusion of right  eye with macular edema  H34.8310 Intravitreal Injection, Pharmacologic Agent - OD - Right Eye    Bevacizumab (AVASTIN) SOLN 1.25 mg  2. Retinal edema  H35.81 OCT, Retina - OU - Both Eyes  3. 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 Essential hypertension  I10   5. Hypertensive retinopathy of both eyes  H35.033   6. Pseudophakia of both eyes  Z96.1     1,2. BRVO w/ CME OD - s/p IVA OD #1 (01.31.20), #2 (02.28.20), #3 (05.08.20), #4 (06.05.20), #5 (07.03.20), #6 (07.31.20), # 7(08.28.20) - delayed follow up to 10 weeks instead of four from 2.28 to 5.08 due to COVID-19 restrictions - FA (06.05.20) shows focal area of vascular non-perfusion and telangiectatic vessels superior macula -- consistent with BRVO - BCVA 20/25 OD - OCT shows mild interval increase in IRF/IRHM - recommend IVA OD #8today, 10.02.20 w/ dec interval back to 4 wks - RBA of procedure discussed, questions answered - informed consent obtained and signed - see procedure note - F/U 4 weeks -- DFE/OCT/possible injectoin  3. Mild to moderate nonproliferative diabetic retinopathy OU  - The incidence, risk factors for progression, natural history and treatment options for diabetic retinopathy were discussed with patient.    - The need for close monitoring of blood glucose, blood pressure, and serum lipids, avoiding cigarette or any type of tobacco, and the need for long term follow up was also discussed with patient.  - exam with rare, scattered IRH/MA OU  - FA (06.05.20) shows leaking microaneurysms OU; no NV OU  - OCT shows interval improvement in IRF OD (BRVO w/ CME as above)  - monitor  4,5. Hypertensive retinopathy OU  - discussed importance of tight BP control  - monitor  6. Pseudophakia OU  - s/p CE/IOL OU  - OD: done at [facility] around 2010; OS: Dr. Nancy Fetter (May 2020)  - beautiful surgeries, doing well  - completed post op regimen OS and updated glasses as of last visit  -  monitor    Ophthalmic Meds Ordered this visit:  Meds ordered this encounter  Medications  . Bevacizumab (AVASTIN) SOLN 1.25 mg       Return in about 4 weeks (around 08/10/2019) for f/u BRVO OD, DFE, OCT.  There are no Patient Instructions on file for this visit.   Explained the diagnoses, plan, and follow up with the patient and they expressed understanding.  Patient expressed understanding of the importance of proper follow up care.   This document serves as a record of services personally performed by Gardiner Sleeper, MD, PhD. It was created on their behalf by Roselee Nova, COMT. The creation of this record is the provider's dictation and/or activities during the visit.  Electronically signed by: Roselee Nova, COMT 07/13/19 12:57 PM   This document serves as a record of services personally performed by Gardiner Sleeper, MD, PhD. It was created on their behalf by Ernest Mallick, OA, an ophthalmic assistant. The creation of this record is the provider's dictation and/or activities during the visit.    Electronically signed by: Ernest Mallick, OA  10.02.2020 12:57 PM    Gardiner Sleeper, M.D., Ph.D. Diseases & Surgery of the Retina and Vitreous [facility] 07/13/19  I have reviewed the above documentation for accuracy and completeness, and I agree with the above. Gardiner Sleeper, M.D., Ph.D. 07/13/19 12:57 PM  Abbreviations: M myopia (nearsighted); A astigmatism; H hyperopia (farsighted); P presbyopia; Mrx spectacle prescription;  CTL contact lenses; OD right eye; OS left eye; OU both eyes  XT exotropia; ET esotropia; PEK punctate epithelial keratitis; PEE punctate epithelial erosions; DES dry eye syndrome; MGD meibomian gland dysfunction; ATs artificial tears; PFAT's preservative free artificial tears; [place] nuclear sclerotic cataract; PSC posterior subcapsular cataract; ERM epi-retinal membrane; PVD posterior vitreous detachment; RD retinal detachment; DM  diabetes mellitus; DR diabetic retinopathy; NPDR non-proliferative diabetic retinopathy; PDR proliferative diabetic retinopathy; CSME clinically significant macular edema; DME diabetic macular edema; dbh dot blot hemorrhages; CWS cotton wool spot; POAG primary open angle glaucoma; C/D cup-to-disc ratio; HVF humphrey visual field; GVF goldmann visual field; OCT optical coherence tomography; IOP intraocular pressure; BRVO Branch retinal vein occlusion; CRVO central retinal vein occlusion; CRAO central retinal artery occlusion; BRAO branch retinal artery occlusion; RT retinal tear; SB scleral buckle; PPV pars plana vitrectomy; VH Vitreous hemorrhage; PRP panretinal laser photocoagulation; IVK intravitreal kenalog; VMT vitreomacular traction; MH Macular hole;  NVD neovascularization of the disc; NVE neovascularization elsewhere; AREDS age related eye disease study; ARMD age related macular degeneration; POAG primary open angle glaucoma; EBMD epithelial/anterior basement membrane dystrophy; ACIOL anterior chamber intraocular lens; IOL intraocular lens; PCIOL posterior chamber intraocular lens; Phaco/IOL phacoemulsification with intraocular lens placement; [place] photorefractive keratectomy; LASIK laser assisted in situ keratomileusis; HTN hypertension; DM diabetes mellitus; COPD chronic obstructive pulmonary disease

## 2019-07-13 ENCOUNTER — Encounter (INDEPENDENT_AMBULATORY_CARE_PROVIDER_SITE_OTHER): Payer: Self-pay | Admitting: Ophthalmology

## 2019-07-13 ENCOUNTER — Ambulatory Visit (INDEPENDENT_AMBULATORY_CARE_PROVIDER_SITE_OTHER): Payer: Medicare Other | Admitting: Ophthalmology

## 2019-07-13 ENCOUNTER — Other Ambulatory Visit: Payer: Self-pay

## 2019-07-13 DIAGNOSIS — I10 Essential (primary) hypertension: Secondary | ICD-10-CM | POA: Diagnosis not present

## 2019-07-13 DIAGNOSIS — H35033 Hypertensive retinopathy, bilateral: Secondary | ICD-10-CM

## 2019-07-13 DIAGNOSIS — H3581 Retinal edema: Secondary | ICD-10-CM | POA: Diagnosis not present

## 2019-07-13 DIAGNOSIS — H34831 Tributary (branch) retinal vein occlusion, right eye, with macular edema: Secondary | ICD-10-CM | POA: Diagnosis not present

## 2019-07-13 DIAGNOSIS — E113393 Type 2 diabetes mellitus with moderate nonproliferative diabetic retinopathy without macular edema, bilateral: Secondary | ICD-10-CM

## 2019-07-13 DIAGNOSIS — Z961 Presence of intraocular lens: Secondary | ICD-10-CM

## 2019-07-13 MED ORDER — BEVACIZUMAB CHEMO INJECTION 1.25MG/0.05ML SYRINGE FOR KALEIDOSCOPE
1.2500 mg | INTRAVITREAL | Status: AC | PRN
  Administered 2019-07-13: 13:00:00 1.25 mg via INTRAVITREAL

## 2019-08-09 NOTE — Progress Notes (Addendum)
[facility] Note  08/10/2019     CHIEF COMPLAINT Patient presents for Retina Follow Up   HISTORY OF PRESENT ILLNESS: Meredith Shaw is a 73 y.o. female who presents to the clinic today for:   HPI    Retina Follow Up    Patient presents with  CRVO/BRVO.  In right eye.  This started months ago.  Severity is moderate.  Duration of 4 weeks.  Since onset it is stable.  I, the attending physician,  performed the HPI with the patient and updated documentation appropriately.          Comments    73 y/o female pt here for 4 wk f/u for BRVO w/CME OD.  No change in [place] OU.  Denies pain, flashes, floaters.  AT prn OU.  BS 110 a few days ago.  Last A1C 8.0.  BP has been doing well.       Last edited by Bernarda Caffey, MD on 08/10/2019  9:27 AM. (History)    pt states vision stable  Referring physician: Marin Comment [address], [place],  [place] [phone]  HISTORICAL INFORMATION:   Selected notes from the MEDICAL RECORD NUMBER Referred by Dr. Maryjane Hurter for DM exam LEE: 01.16.20 (M. Le) [BCVA: OD: 20/40 OS: 20/50-3] Ocular Hx-PDR OU, macular edema OU, cataracts OU PMH-DM (takes metformin), arthritis, HTN, hypothyroidism    CURRENT MEDICATIONS: No current outpatient medications on file. (Ophthalmic Drugs)   No current facility-administered medications for this visit.  (Ophthalmic Drugs)   Current Outpatient Medications (Other)  Medication Sig  . amLODipine (NORVASC) 5 MG tablet Take 1 tablet (5 mg total) by mouth daily.  . Blood Glucose Monitoring Suppl (ONETOUCH VERIO) w/Device KIT 1 each by Does not apply route daily.  . cholecalciferol (VITAMIN D) 1000 units tablet Take 5,000 Units by mouth daily.   Marland Kitchen denosumab (PROLIA) 60 MG/ML SOSY injection Inject 60 mg into the skin every 6 (six) months.  Marland Kitchen glucose blood (ONETOUCH VERIO) test strip Use as instructed  . Icosapent Ethyl 1 g CAPS Take 2 capsules (2 g total) by mouth 2 (two) times daily with a  meal.  . Lancets (ONETOUCH ULTRASOFT) lancets Use as instructed  . lisinopril (ZESTRIL) 20 MG tablet Take 1 tablet (20 mg total) by mouth daily.  . metFORMIN (GLUCOPHAGE-XR) 500 MG 24 hr tablet   . metFORMIN (GLUMETZA) 1000 MG (MOD) 24 hr tablet Take 1 tablet (1,000 mg total) by mouth 2 (two) times daily with a meal.  . metoprolol tartrate (LOPRESSOR) 100 MG tablet Take 1 tablet (100 mg total) by mouth 2 (two) times daily.  . naproxen sodium (ALEVE) 220 MG tablet Take 220 mg by mouth 2 (two) times daily as needed (for pain).  . pravastatin (PRAVACHOL) 40 MG tablet Take 1 tablet (40 mg total) by mouth at bedtime.  Marland Kitchen levothyroxine (SYNTHROID) 112 MCG tablet Take 1 tablet (112 mcg total) by mouth every other day. Before breakfast  . levothyroxine (SYNTHROID) 137 MCG tablet Take 1 tablet (137 mcg total) by mouth every other day. Before breakfast   Current Facility-Administered Medications (Other)  Medication Route  . Bevacizumab (AVASTIN) SOLN 1.25 mg Intravitreal  . Bevacizumab (AVASTIN) SOLN 1.25 mg Intravitreal  . denosumab (PROLIA) injection 60 mg Subcutaneous      REVIEW OF SYSTEMS: ROS    Positive for: Genitourinary, Musculoskeletal, Endocrine, Eyes   Negative for: Constitutional, Gastrointestinal, Neurological, Skin, HENT, Cardiovascular, Respiratory, Psychiatric, Allergic/Imm, Heme/Lymph  Last edited by Matthew Folks, COA on 08/10/2019  9:11 AM. (History)       ALLERGIES Allergies  Allergen Reactions  . Penicillins Itching and Rash    PAST MEDICAL HISTORY Past Medical History:  Diagnosis Date  . Arthritis    OA AND PAIN BOTH KNEES -RIGHT KNEE HURTS WORSE THAN LEFT  . Cataract   . Diabetes mellitus    ORAL MEDICATION - NO INSULIN  . Hypertension   . Hypertensive retinopathy    OU  . Hypothyroidism   . Kidney stones    NONE AT PRESENT TIME THAT PT IS AWARE OF  . Osteoporosis    Past Surgical History:  Procedure Laterality Date  . ABDOMINAL HYSTERECTOMY    .  CATARACT EXTRACTION    . CHOLECYSTECTOMY  1972  . COLONOSCOPY    . EYE SURGERY     RIGHT CATARACT EXTRACTON WITH LENS IMPLANT  . GROWTH REMOVED FROM THYROID    . INTRAMEDULLARY (IM) NAIL INTERTROCHANTERIC Left 02/10/2018   Procedure: INTRAMEDULLARY (IM) NAIL INTERTROCHANTRIC;  Surgeon: Mcarthur Rossetti, MD;  Location: [facility];  Service: Orthopedics;  Laterality: Left;  . INTRAMEDULLARY (IM) NAIL INTERTROCHANTERIC Right 08/20/2018   Procedure: ORIF RIGHT HIP INTERTROC FRACTURE;  Surgeon: Mcarthur Rossetti, MD;  Location: [facility];  Service: Orthopedics;  Laterality: Right;  . left hip surgery   02/2018  . ORIF HIP FRACTURE Right 08/20/2018  . PERCUTANEOUS NEPHROLITHOTOMY    . TOTAL KNEE ARTHROPLASTY  06/09/2012   Procedure: TOTAL KNEE ARTHROPLASTY;  Surgeon: Mcarthur Rossetti, MD;  Location: [facility];  Service: Orthopedics;  Laterality: Right;  Right Total Knee Arthroplasty  . TOTAL KNEE ARTHROPLASTY Left 04/09/2014   Procedure: LEFT TOTAL KNEE ARTHROPLASTY;  Surgeon: Mcarthur Rossetti, MD;  Location: [facility];  Service: Orthopedics;  Laterality: Left;  . TUBAL LIGATION      FAMILY HISTORY Family History  Problem Relation Age of Onset  . Heart disease Mother   . Breast cancer Mother        79's  . Stroke Father   . Heart disease Father   . Healthy Daughter   . Healthy Son   . Liver disease Maternal Grandmother        gall stones imbedded in liver - blead to death when in surgery   . Pneumonia Maternal Grandfather   . Diabetes Paternal Grandmother     SOCIAL HISTORY Social History   Tobacco Use  . Smoking status: Never Smoker  . Smokeless tobacco: Never Used  Substance Use Topics  . Alcohol use: No  . Drug use: No         OPHTHALMIC EXAM:  Base Eye Exam    Visual Acuity (Snellen - Linear)      Right Left   Dist cc 20/30 +2 20/20   Dist ph cc NI    Correction: Glasses       Tonometry (Tonopen, 9:13 AM)      Right Left   Pressure 15 15        Pupils      Dark Light Shape React APD   Right 3 2 Round Brisk None   Left 3 2 Round Brisk None       Visual Fields (Counting fingers)      Left Right    Full Full       Extraocular Movement      Right Left    Full, Ortho Full, Ortho  Neuro/Psych    Oriented x3: Yes   Mood/Affect: Normal       Dilation    Both eyes: 1.0% Mydriacyl, 2.5% Phenylephrine @ 9:13 AM        Slit Lamp and Fundus Exam    Slit Lamp Exam      Right Left   Lids/Lashes Dermatochalasis - upper lid, Meibomian gland dysfunction Dermatochalasis - upper lid, Meibomian gland dysfunction   Conjunctiva/Sclera White and quiet White and quiet   Cornea 1+ Punctate epithelial erosions, Debris in tear film - improved, +ABMD 1+ Punctate epithelial erosions, Debris in tear film   Anterior Chamber deep and clear deep and clear, narrow temporal angle   Iris Round and dilated, No NVI Round and dilated, No NVI   Lens PC IOL in good position, trace Posterior capsular opacification PC IOL in good position with open PC   Vitreous mild Vitreous syneresis, Posterior vitreous detachment, silicone oil bubble mild Vitreous syneresis       Fundus Exam      Right Left   Disc compact, mild tilt, temporal PPA, mild pallor compact, Pink and Sharp, temporal Peripapillary atrophy   C/D Ratio 0.1 0.2   Macula blunted foveal reflex, edema improved, improving IRH and CWS superior macula Flat, good foveal reflex, Retinal pigment epithelial mottling, No edema, rare MA temporal macula   Vessels Vascular attenuation, Tortuous Vascular attenuation, Tortuous   Periphery Attached, scattered reticular degeneration, temporal IRH resolving Attached, reticular degeneration, mild, scattered MA/DBH, focal pigment clumping temporal periphery          IMAGING AND PROCEDURES  Imaging and Procedures for _0 @  OCT, Retina - OU - Both Eyes       Right Eye Quality was good. Central Foveal Thickness: 301. Progression has improved.  Findings include abnormal foveal contour, no SRF, intraretinal fluid, cystoid macular edema (Mild interval improvement in IRF/IRHM).   Left Eye Quality was good. Central Foveal Thickness: 278. Progression has been stable. Findings include normal foveal contour, no IRF, no SRF, vitreomacular adhesion .   Notes *Images captured and stored on drive  Diagnosis / Impression:  OD: superior BRVO w/ CME -- Mild interval improvement in IRF/IRHM OS: NFP; no IRF/SRF--stable  Clinical management:  See below  Abbreviations: NFP - Normal foveal profile. CME - cystoid macular edema. PED - pigment epithelial detachment. IRF - intraretinal fluid. SRF - subretinal fluid. EZ - ellipsoid zone. ERM - epiretinal membrane. ORA - outer retinal atrophy. ORT - outer retinal tubulation. SRHM - subretinal hyper-reflective material        Intravitreal Injection, Pharmacologic Agent - OD - Right Eye       Time Out 08/10/2019. 9:38 AM. Confirmed correct patient, procedure, site, and patient consented.   Anesthesia Topical anesthesia was used. Anesthetic medications included Lidocaine 2%, Proparacaine 0.5%.   Procedure Preparation included 5% betadine to ocular surface, eyelid speculum. A supplied needle was used.   Injection:  1.25 mg Bevacizumab (AVASTIN) SOLN   NDC: 49702-637-85, Lot: [id number] , Expiration date: 09/26/2019   Route: Intravitreal, Site: Right Eye, Waste: 0 mg  Post-op Post injection exam found visual acuity of at least counting fingers. The patient tolerated the procedure well. There were no complications. The patient received written and verbal post procedure care education.                 ASSESSMENT/PLAN:    ICD-10-CM   1. Branch retinal vein occlusion of right eye with macular edema  H34.8310 Intravitreal Injection, Pharmacologic Agent -  OD - Right Eye    Bevacizumab (AVASTIN) SOLN 1.25 mg  2. Retinal edema  H35.81 OCT, Retina - OU - Both Eyes  3. 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 Essential hypertension  I10   5. Hypertensive retinopathy of both eyes  H35.033   6. Pseudophakia of both eyes  Z96.1     1,2. BRVO w/ CME OD - s/p IVA OD #1 (01.31.20), #2 (02.28.20), #3 (05.08.20), #4 (06.05.20), #5 (07.03.20), #6 (07.31.20), # 7(08.28.20), #8 (10.02.20) - delayed follow up to 10 weeks instead of four from 2.28 to 5.08 due to COVID-19 restrictions - FA (06.05.20) shows focal area of vascular non-perfusion and telangiectatic vessels superior macula -- consistent with BRVO - BCVA 20/30 OD - OCT shows mild interval improvement in IRF/IRHM - recommend IVA OD #9today, 10.30.20 w/ f/u in 4 wks - RBA of procedure discussed, questions answered - informed consent obtained and signed - see procedure note - F/U 4 weeks -- DFE/OCT/possible injectoin  3. Mild to moderate nonproliferative diabetic retinopathy OU  - exam with rare, scattered IRH/MA OU  - FA (06.05.20) shows leaking microaneurysms OU; no NV OU  - OCT shows interval improvement in IRF OD (BRVO w/ CME as above)  - monitor  4,5. Hypertensive retinopathy OU  - discussed importance of tight BP control  - monitor  6. Pseudophakia OU  - s/p CE/IOL OU  - OD: done at [facility] around 2010; OS: Dr. Nancy Fetter (May 2020)  - beautiful surgeries, doing well  - completed post op regimen OS and updated glasses as of last visit  - monitor    Ophthalmic Meds Ordered this visit:  Meds ordered this encounter  Medications  . Bevacizumab (AVASTIN) SOLN 1.25 mg       Return in about 4 weeks (around 09/07/2019) for BRVO OD - Dilated Exam, OCT, Possible Injxn.  There are no Patient Instructions on file for this visit.   Explained the diagnoses, plan, and follow up with the  patient and they expressed understanding.  Patient expressed understanding of the importance of proper follow up care.   This document serves as a record of services personally performed by Gardiner Sleeper, MD, PhD. It was created on their behalf by Ernest Mallick, OA, an ophthalmic assistant. The creation of this record is the provider's dictation and/or activities during the visit.    Electronically signed by: Ernest Mallick, OA 10.29.2020 9:47 AM    Gardiner Sleeper, M.D., Ph.D. Diseases & Surgery of the Retina and Vitreous [facility]  I have reviewed the above documentation for accuracy and completeness, and I agree with the above. Gardiner Sleeper, M.D., Ph.D. 08/10/19 9:47 AM    Abbreviations: M myopia (nearsighted); A astigmatism; H hyperopia (farsighted); P presbyopia; Mrx spectacle prescription;  CTL contact lenses; OD right eye; OS left eye; OU both eyes  XT exotropia; ET esotropia; PEK punctate epithelial keratitis; PEE punctate epithelial erosions; DES dry eye syndrome; MGD meibomian gland dysfunction; ATs artificial tears; PFAT's preservative free artificial tears; [place] nuclear sclerotic cataract; PSC posterior subcapsular cataract; ERM epi-retinal membrane; PVD posterior vitreous detachment; RD retinal detachment; DM diabetes mellitus; DR diabetic retinopathy; NPDR non-proliferative diabetic retinopathy; PDR proliferative diabetic retinopathy; CSME clinically significant macular edema; DME diabetic macular edema; dbh dot blot hemorrhages; CWS cotton wool spot; POAG primary open angle glaucoma; C/D cup-to-disc ratio; HVF humphrey visual field;  GVF goldmann visual field; OCT optical coherence tomography; IOP intraocular pressure; BRVO Branch retinal vein occlusion; CRVO central retinal vein occlusion; CRAO central retinal artery occlusion; BRAO branch retinal artery occlusion; RT retinal tear; SB scleral buckle; PPV pars plana vitrectomy; VH Vitreous hemorrhage; PRP  panretinal laser photocoagulation; IVK intravitreal kenalog; VMT vitreomacular traction; MH Macular hole;  NVD neovascularization of the disc; NVE neovascularization elsewhere; AREDS age related eye disease study; ARMD age related macular degeneration; POAG primary open angle glaucoma; EBMD epithelial/anterior basement membrane dystrophy; ACIOL anterior chamber intraocular lens; IOL intraocular lens; PCIOL posterior chamber intraocular lens; Phaco/IOL phacoemulsification with intraocular lens placement; [place] photorefractive keratectomy; LASIK laser assisted in situ keratomileusis; HTN hypertension; DM diabetes mellitus; COPD chronic obstructive pulmonary disease

## 2019-08-10 ENCOUNTER — Encounter (INDEPENDENT_AMBULATORY_CARE_PROVIDER_SITE_OTHER): Payer: Self-pay | Admitting: Ophthalmology

## 2019-08-10 ENCOUNTER — Encounter (INDEPENDENT_AMBULATORY_CARE_PROVIDER_SITE_OTHER): Payer: Medicare Other | Admitting: Ophthalmology

## 2019-08-10 ENCOUNTER — Ambulatory Visit (INDEPENDENT_AMBULATORY_CARE_PROVIDER_SITE_OTHER): Payer: Medicare Other | Admitting: Ophthalmology

## 2019-08-10 DIAGNOSIS — H35033 Hypertensive retinopathy, bilateral: Secondary | ICD-10-CM

## 2019-08-10 DIAGNOSIS — I10 Essential (primary) hypertension: Secondary | ICD-10-CM

## 2019-08-10 DIAGNOSIS — H34831 Tributary (branch) retinal vein occlusion, right eye, with macular edema: Secondary | ICD-10-CM

## 2019-08-10 DIAGNOSIS — H3581 Retinal edema: Secondary | ICD-10-CM | POA: Diagnosis not present

## 2019-08-10 DIAGNOSIS — Z961 Presence of intraocular lens: Secondary | ICD-10-CM

## 2019-08-10 DIAGNOSIS — E113393 Type 2 diabetes mellitus with moderate nonproliferative diabetic retinopathy without macular edema, bilateral: Secondary | ICD-10-CM | POA: Diagnosis not present

## 2019-08-10 MED ORDER — BEVACIZUMAB CHEMO INJECTION 1.25MG/0.05ML SYRINGE FOR KALEIDOSCOPE
1.2500 mg | INTRAVITREAL | Status: AC | PRN
  Administered 2019-08-10: 1.25 mg via INTRAVITREAL

## 2019-08-15 ENCOUNTER — Ambulatory Visit (INDEPENDENT_AMBULATORY_CARE_PROVIDER_SITE_OTHER): Payer: Medicare Other | Admitting: Family Medicine

## 2019-08-15 ENCOUNTER — Other Ambulatory Visit: Payer: Self-pay

## 2019-08-15 ENCOUNTER — Encounter: Payer: Self-pay | Admitting: Family Medicine

## 2019-08-15 VITALS — BP 158/83 | HR 60 | Temp 97.8°F | Resp 20 | Ht 67.0 in | Wt 175.0 lb

## 2019-08-15 DIAGNOSIS — E1159 Type 2 diabetes mellitus with other circulatory complications: Secondary | ICD-10-CM | POA: Diagnosis not present

## 2019-08-15 DIAGNOSIS — I10 Essential (primary) hypertension: Secondary | ICD-10-CM

## 2019-08-15 DIAGNOSIS — E039 Hypothyroidism, unspecified: Secondary | ICD-10-CM

## 2019-08-15 DIAGNOSIS — E1122 Type 2 diabetes mellitus with diabetic chronic kidney disease: Secondary | ICD-10-CM

## 2019-08-15 DIAGNOSIS — E1169 Type 2 diabetes mellitus with other specified complication: Secondary | ICD-10-CM | POA: Diagnosis not present

## 2019-08-15 DIAGNOSIS — N183 Chronic kidney disease, stage 3 unspecified: Secondary | ICD-10-CM

## 2019-08-15 DIAGNOSIS — E785 Hyperlipidemia, unspecified: Secondary | ICD-10-CM

## 2019-08-15 DIAGNOSIS — Z23 Encounter for immunization: Secondary | ICD-10-CM | POA: Diagnosis not present

## 2019-08-15 DIAGNOSIS — E118 Type 2 diabetes mellitus with unspecified complications: Secondary | ICD-10-CM | POA: Diagnosis not present

## 2019-08-15 LAB — BAYER DCA HB A1C WAIVED: HB A1C (BAYER DCA - WAIVED): 8.9 % — ABNORMAL HIGH (ref <7.0)

## 2019-08-15 MED ORDER — METFORMIN HCL 1000 MG PO TABS: 1000.0000 mg | ORAL_TABLET | Freq: Two times a day (BID) | ORAL | 3 refills | Status: DC

## 2019-08-15 MED ORDER — CANAGLIFLOZIN 100 MG PO TABS: 100.0000 mg | ORAL_TABLET | Freq: Every day | ORAL | 5 refills | Status: DC

## 2019-08-15 MED ORDER — METFORMIN HCL 500 MG PO TABS: 500.0000 mg | ORAL_TABLET | Freq: Two times a day (BID) | ORAL | 2 refills | Status: DC

## 2019-08-15 MED ORDER — PRAVASTATIN SODIUM 40 MG PO TABS: 40.0000 mg | ORAL_TABLET | Freq: Every day | ORAL | 2 refills | Status: DC

## 2019-08-15 NOTE — Patient Instructions (Signed)
Hypothyroidism  Hypothyroidism is when the thyroid gland does not make enough of certain hormones (it is underactive). The thyroid gland is a small gland located in the lower front part of the neck, just in front of the windpipe (trachea). This gland makes hormones that help control how the body uses food for energy (metabolism) as well as how the heart and brain function. These hormones also play a role in keeping your bones strong. When the thyroid is underactive, it produces too little of the hormones thyroxine (T4) and triiodothyronine (T3). What are the causes? This condition may be caused by:  Hashimoto's disease. This is a disease in which the body's disease-fighting system (immune system) attacks the thyroid gland. This is the most common cause.  Viral infections.  Pregnancy.  Certain medicines.  Birth defects.  Past radiation treatments to the head or neck for cancer.  Past treatment with radioactive iodine.  Past exposure to radiation in the environment.  Past surgical removal of part or all of the thyroid.  Problems with a gland in the center of the brain (pituitary gland).  Lack of enough iodine in the diet. What increases the risk? You are more likely to develop this condition if:  You are female.  You have a family history of thyroid conditions.  You use a medicine called lithium.  You take medicines that affect the immune system (immunosuppressants). What are the signs or symptoms? Symptoms of this condition include:  Feeling as though you have no energy (lethargy).  Not being able to tolerate cold.  Weight gain that is not explained by a change in diet or exercise habits.  Lack of appetite.  Dry skin.  Coarse hair.  Menstrual irregularity.  Slowing of thought processes.  Constipation.  Sadness or depression. How is this diagnosed? This condition may be diagnosed based on:  Your symptoms, your medical history, and a physical exam.  Blood  tests. You may also have imaging tests, such as an ultrasound or MRI. How is this treated? This condition is treated with medicine that replaces the thyroid hormones that your body does not make. After you begin treatment, it may take several weeks for symptoms to go away. Follow these instructions at home:  Take over-the-counter and prescription medicines only as told by your health care provider.  If you start taking any new medicines, tell your health care provider.  Keep all follow-up visits as told by your health care provider. This is important. ? As your condition improves, your dosage of thyroid hormone medicine may change. ? You will need to have blood tests regularly so that your health care provider can monitor your condition. Contact a health care provider if:  Your symptoms do not get better with treatment.  You are taking thyroid replacement medicine and you: ? Sweat a lot. ? Have tremors. ? Feel anxious. ? Lose weight rapidly. ? Cannot tolerate heat. ? Have emotional swings. ? Have diarrhea. ? Feel weak. Get help right away if you have:  Chest pain.  An irregular heartbeat.  A rapid heartbeat.  Difficulty breathing. Summary  Hypothyroidism is when the thyroid gland does not make enough of certain hormones (it is underactive).  When the thyroid is underactive, it produces too little of the hormones thyroxine (T4) and triiodothyronine (T3).  The most common cause is Hashimoto's disease, a disease in which the body's disease-fighting system (immune system) attacks the thyroid gland. The condition can also be caused by viral infections, medicine, pregnancy, or past   radiation treatment to the head or neck.  Symptoms may include weight gain, dry skin, constipation, feeling as though you do not have energy, and not being able to tolerate cold.  This condition is treated with medicine to replace the thyroid hormones that your body does not make. This information  is not intended to replace advice given to you by your health care provider. Make sure you discuss any questions you have with your health care provider. Document Released: 09/27/2005 Document Revised: 09/09/2017 Document Reviewed: 09/07/2017 Elsevier Patient Education  2020 [employer]  

## 2019-08-15 NOTE — Progress Notes (Signed)
Subjective:  Patient ID: Meredith Shaw, female    DOB: [birth year], 73 y.o.   MRN: [id number]  Patient Care Team: Baruch Gouty, FNP as PCP - General (Family Medicine) Mcarthur Rossetti, MD as Consulting Physician (Orthopedic Surgery) Rana Snare, MD as Consulting Physician (Urology) Corey Harold, MD as Consulting Physician   Chief Complaint:  Medical Management of Chronic Issues (3 mo ), Hyperlipidemia, Diabetes, Hypertension, and Hypothyroidism   HPI: Meredith Shaw is a 73 y.o. female presenting on 08/15/2019 for Medical Management of Chronic Issues (3 mo ), Hyperlipidemia, Diabetes, Hypertension, and Hypothyroidism   1. Type 2 diabetes mellitus with complication, without long-term current use of insulin (HCC) Pt presents for follow up evaluation of Type 2 diabetes mellitus.  Current symptoms include hyperglycemia. Patient denies foot ulcerations, hypoglycemia , increased appetite, nausea, paresthesia of the feet, polydipsia, polyuria, visual disturbances, vomiting and weight loss.  Current diabetic medications include metformin 500 mg twice daily. Pt reports she tried to increase to 1000 mg twice daily and was unable to tolerate the change.  Compliant with meds - Yes  Current monitoring regimen: home blood tests - a few times weekly Home blood sugar records: trend: fluctuating a lot Any episodes of hypoglycemia? no  Known diabetic complications: nephropathy, retinopathy and cardiovascular disease Cardiovascular risk factors: diabetes mellitus, dyslipidemia and hypertension Eye exam current (within one year): yes Podiatry yearly?  No Weight trend: stable Current diet: well balanced Current exercise: walking  PNA Vaccine UTD?  Yes Hep B Vaccine?  Yes Tdap Vaccine UTD?  No Urine microalbumin UTD? Yes  Is She on ACE inhibitor or angiotensin II receptor blocker?  Yes, Lisinopril Is She on statin? Yes pravastatin Is She on ASA 81 mg daily?  No  2.  Hypertension associated with type 2 diabetes mellitus ([place]) Complaint with meds - Yes Current Medications - Norvasc, Lisinopril Checking BP at home ranging 130/70 Exercising Regularly - walks Watching Salt intake - Yes Pertinent ROS:  Headache - No Fatigue - No Visual Disturbances - at times, followed by opthalmology  Chest pain - No Dyspnea - No Palpitations - No LE edema - No They report good compliance with medications and can restate their regimen by memory. No medication side effects.  Family, social, and smoking history reviewed.   BP Readings from Last 3 Encounters:  08/15/19 (!) 158/83  05/14/19 (!) 154/83  11/09/18 (!) 153/80   CMP Latest Ref Rng & Units 05/14/2019 11/09/2018 08/22/2018  Glucose 65 - 99 mg/dL 247(H) 218(H) 177(H)  BUN 8 - 27 mg/dL 30(H) 25 22  Creatinine 0.57 - 1.00 mg/dL 1.39(H) 1.14(H) 1.39(H)  Sodium 134 - 144 mmol/L 141 140 138  Potassium 3.5 - 5.2 mmol/L 4.3 4.6 3.4(L)  Chloride 96 - 106 mmol/L 106 103 106  CO2 20 - 29 mmol/L 19(L) 21 24  Calcium 8.7 - 10.3 mg/dL 9.4 9.6 8.6(L)  Total Protein 6.0 - 8.5 g/dL 6.4 6.3 -  Total Bilirubin 0.0 - 1.2 mg/dL 0.2 0.2 -  Alkaline Phos 39 - 117 IU/L 59 79 -  AST 0 - 40 IU/L 14 14 -  ALT 0 - 32 IU/L 14 14 -      3. Hyperlipidemia associated with type 2 diabetes mellitus ([place]) Compliant with medications - Yes Current medications - pravastatin Side effects from medications - No Diet - generally balanced Exercise - walks  Lab Results  Component Value Date   CHOL 166 05/14/2019   HDL 35 (L)  05/14/2019   LDLCALC 79 05/14/2019   TRIG 259 (H) 05/14/2019   CHOLHDL 4.7 (H) 05/14/2019     Family and personal medical history reviewed. Smoking and ETOH history reviewed.    4. Acquired hypothyroidism Compliant with medications - Yes Current medications - Synthroid 137 mbg Adverse side effects - No Weight - stable  Bowel habit changes - No Heat or cold intolerance - No Mood changes - No Changes in  sleep habits - No Fatigue - No Skin, hair, or nail changes - No Tremor - No Palpitations - No Edema - No Shortness of breath - No  Lab Results  Component Value Date   TSH 0.177 (L) 05/14/2019     5. CKD stage 3 due to type 2 diabetes mellitus ([place]) Denies decreased urine output, swelling, weakness, confusion, or fatigue. No hematuria or dark colored urine.      Relevant past medical, surgical, family, and social history reviewed and updated as indicated.  Allergies and medications reviewed and updated. Date reviewed: Chart in Epic.   Past Medical History:  Diagnosis Date  . Arthritis    OA AND PAIN BOTH KNEES -RIGHT KNEE HURTS WORSE THAN LEFT  . Cataract   . Diabetes mellitus    ORAL MEDICATION - NO INSULIN  . Hypertension   . Hypertensive retinopathy    OU  . Hypothyroidism   . Kidney stones    NONE AT PRESENT TIME THAT PT IS AWARE OF  . Osteoporosis     Past Surgical History:  Procedure Laterality Date  . ABDOMINAL HYSTERECTOMY    . CATARACT EXTRACTION    . CHOLECYSTECTOMY  1972  . COLONOSCOPY    . EYE SURGERY     RIGHT CATARACT EXTRACTON WITH LENS IMPLANT  . GROWTH REMOVED FROM THYROID    . INTRAMEDULLARY (IM) NAIL INTERTROCHANTERIC Left 02/10/2018   Procedure: INTRAMEDULLARY (IM) NAIL INTERTROCHANTRIC;  Surgeon: Mcarthur Rossetti, MD;  Location: [facility];  Service: Orthopedics;  Laterality: Left;  . INTRAMEDULLARY (IM) NAIL INTERTROCHANTERIC Right 08/20/2018   Procedure: ORIF RIGHT HIP INTERTROC FRACTURE;  Surgeon: Mcarthur Rossetti, MD;  Location: [facility];  Service: Orthopedics;  Laterality: Right;  . left hip surgery   02/2018  . ORIF HIP FRACTURE Right 08/20/2018  . PERCUTANEOUS NEPHROLITHOTOMY    . TOTAL KNEE ARTHROPLASTY  06/09/2012   Procedure: TOTAL KNEE ARTHROPLASTY;  Surgeon: Mcarthur Rossetti, MD;  Location: [facility];  Service: Orthopedics;  Laterality: Right;  Right Total Knee Arthroplasty  . TOTAL KNEE ARTHROPLASTY Left 04/09/2014    Procedure: LEFT TOTAL KNEE ARTHROPLASTY;  Surgeon: Mcarthur Rossetti, MD;  Location: [facility];  Service: Orthopedics;  Laterality: Left;  . TUBAL LIGATION      Social History   Socioeconomic History  . Marital status: Married    Spouse name: Berneta Sages   . Number of children: 2  . Years of education: Not on file  . Highest education level: Not on file  Occupational History  . Occupation: retired     Fish farm manager: [employer]    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    Comment: bank - 18yr  Social Needs  . Financial resource strain: Not on file  . Food insecurity    Worry: Not on file    Inability: Not on file  . Transportation needs    Medical: Not on file    Non-medical: Not on file  Tobacco Use  . Smoking status: Never Smoker  . Smokeless tobacco: Never Used  Substance and Sexual  Activity  . Alcohol use: No  . Drug use: No  . Sexual activity: Yes  Lifestyle  . Physical activity    Days per week: Not on file    Minutes per session: Not on file  . Stress: Not on file  Relationships  . Social Herbalist on phone: Not on file    Gets together: Not on file    Attends religious service: Not on file    Active member of club or organization: Not on file    Attends meetings of clubs or organizations: Not on file    Relationship status: Not on file  . Intimate partner violence    Fear of current or ex partner: Not on file    Emotionally abused: Not on file    Physically abused: Not on file    Forced sexual activity: Not on file  Other Topics Concern  . Not on file  Social History Narrative  . Not on file    Outpatient Encounter Medications as of 08/15/2019  Medication Sig  . amLODipine (NORVASC) 5 MG tablet Take 1 tablet (5 mg total) by mouth daily.  . Blood Glucose Monitoring Suppl (ONETOUCH VERIO) w/Device KIT 1 each by Does not apply route daily.  . cholecalciferol (VITAMIN D) 1000 units tablet Take 5,000 Units by mouth daily.   Marland Kitchen denosumab (PROLIA) 60 MG/ML SOSY injection  Inject 60 mg into the skin every 6 (six) months.  Marland Kitchen glucose blood (ONETOUCH VERIO) test strip Use as instructed  . Lancets (ONETOUCH ULTRASOFT) lancets Use as instructed  . levothyroxine (SYNTHROID) 137 MCG tablet Take 1 tablet (137 mcg total) by mouth every other day. Before breakfast  . lisinopril (ZESTRIL) 20 MG tablet Take 1 tablet (20 mg total) by mouth daily.  . metFORMIN (GLUCOPHAGE) 500 MG tablet Take 1 tablet (500 mg total) by mouth 2 (two) times daily with a meal.  . metoprolol tartrate (LOPRESSOR) 100 MG tablet Take 1 tablet (100 mg total) by mouth 2 (two) times daily.  . naproxen sodium (ALEVE) 220 MG tablet Take 220 mg by mouth 2 (two) times daily as needed (for pain).  . pravastatin (PRAVACHOL) 40 MG tablet Take 1 tablet (40 mg total) by mouth at bedtime.  . [DISCONTINUED] metFORMIN (GLUCOPHAGE) 500 MG tablet Take 500 mg by mouth 2 (two) times daily with a meal.  . [DISCONTINUED] pravastatin (PRAVACHOL) 40 MG tablet Take 1 tablet (40 mg total) by mouth at bedtime.  . canagliflozin (INVOKANA) 100 MG TABS tablet Take 1 tablet (100 mg total) by mouth daily before breakfast.  . [DISCONTINUED] Icosapent Ethyl 1 g CAPS Take 2 capsules (2 g total) by mouth 2 (two) times daily with a meal.  . [DISCONTINUED] levothyroxine (SYNTHROID) 112 MCG tablet Take 1 tablet (112 mcg total) by mouth every other day. Before breakfast  . [DISCONTINUED] metFORMIN (GLUCOPHAGE) 1000 MG tablet Take 1 tablet (1,000 mg total) by mouth 2 (two) times daily with a meal.  . [DISCONTINUED] metFORMIN (GLUCOPHAGE-XR) 500 MG 24 hr tablet   . [DISCONTINUED] metFORMIN (GLUMETZA) 1000 MG (MOD) 24 hr tablet Take 1 tablet (1,000 mg total) by mouth 2 (two) times daily with a meal.   Facility-Administered Encounter Medications as of 08/15/2019  Medication  . Bevacizumab (AVASTIN) SOLN 1.25 mg  . Bevacizumab (AVASTIN) SOLN 1.25 mg  . denosumab (PROLIA) injection 60 mg    Allergies  Allergen Reactions  . Penicillins  Itching and Rash    Review of Systems  Constitutional:  Negative for activity change, appetite change, chills, diaphoresis, fatigue, fever and unexpected weight change.  HENT: Negative.   Eyes: Positive for visual disturbance. Negative for photophobia.  Respiratory: Negative for cough, chest tightness and shortness of breath.   Cardiovascular: Negative for chest pain, palpitations and leg swelling.  Gastrointestinal: Negative for abdominal distention, abdominal pain, anal bleeding, blood in stool, constipation, diarrhea, nausea, rectal pain and vomiting.  Endocrine: Negative.  Negative for cold intolerance, heat intolerance, polydipsia, polyphagia and polyuria.  Genitourinary: Negative for decreased urine volume, difficulty urinating, dysuria, frequency and urgency.  Musculoskeletal: Positive for arthralgias (from right hip fracture, minimal, doing great). Negative for gait problem and myalgias.  Skin: Negative.   Allergic/Immunologic: Negative.   Neurological: Negative for dizziness, tremors, seizures, syncope, facial asymmetry, speech difficulty, weakness, light-headedness, numbness and headaches.  Hematological: Negative.   Psychiatric/Behavioral: Negative for confusion, hallucinations, sleep disturbance and suicidal ideas.  All other systems reviewed and are negative.       Objective:  BP (!) 158/83   Pulse 60   Temp 97.8 F (36.6 C)   Resp 20   Ht 5' 7"  (1.702 m)   Wt 175 lb (79.4 kg)   SpO2 97%   BMI 27.41 kg/m    Wt Readings from Last 3 Encounters:  08/15/19 175 lb (79.4 kg)  05/14/19 174 lb (78.9 kg)  11/09/18 174 lb (78.9 kg)    Physical Exam Vitals signs and nursing note reviewed.  Constitutional:      General: She is not in acute distress.    Appearance: Normal appearance. She is well-developed and well-groomed. She is not ill-appearing, toxic-appearing or diaphoretic.  HENT:     Head: Normocephalic and atraumatic.     Jaw: There is normal jaw occlusion.      Right Ear: Hearing, tympanic membrane, ear canal and external ear normal.     Left Ear: Hearing, ear canal and external ear normal.     Nose: Nose normal.     Mouth/Throat:     Lips: Pink.     Mouth: Mucous membranes are moist.     Pharynx: Oropharynx is clear. Uvula midline.  Eyes:     General: Lids are normal.     Extraocular Movements: Extraocular movements intact.     Conjunctiva/sclera: Conjunctivae normal.     Pupils: Pupils are equal, round, and reactive to light.  Neck:     Musculoskeletal: Normal range of motion and neck supple.     Thyroid: No thyroid mass, thyromegaly or thyroid tenderness.     Vascular: No carotid bruit or JVD.     Trachea: Trachea and phonation normal.  Cardiovascular:     Rate and Rhythm: Normal rate and regular rhythm.     Chest Wall: PMI is not displaced.     Pulses: Normal pulses.     Heart sounds: Normal heart sounds. No murmur. No friction rub. No gallop.   Pulmonary:     Effort: Pulmonary effort is normal. No respiratory distress.     Breath sounds: Normal breath sounds. No wheezing.  Abdominal:     General: Bowel sounds are normal. There is no distension or abdominal bruit.     Palpations: Abdomen is soft. There is no hepatomegaly or splenomegaly.     Tenderness: There is no abdominal tenderness. There is no right CVA tenderness or left CVA tenderness.     Hernia: No hernia is present.  Musculoskeletal:     Right hip: She exhibits decreased range of motion. She  exhibits normal strength, no tenderness, no bony tenderness, no swelling, no crepitus, no deformity and no laceration.     Right lower leg: No edema.     Left lower leg: No edema.  Lymphadenopathy:     Cervical: No cervical adenopathy.  Skin:    General: Skin is warm and dry.     Capillary Refill: Capillary refill takes less than 2 seconds.     Coloration: Skin is not cyanotic, jaundiced or pale.     Findings: No rash.  Neurological:     General: No focal deficit present.      Mental Status: She is alert and oriented to person, place, and time.     Cranial Nerves: Cranial nerves are intact. No cranial nerve deficit.     Sensory: Sensation is intact. No sensory deficit.     Motor: Motor function is intact. No weakness.     Coordination: Coordination is intact. Coordination normal.     Gait: Gait is intact. Gait normal.     Deep Tendon Reflexes: Reflexes are normal and symmetric. Reflexes normal.  Psychiatric:        Attention and Perception: Attention and perception normal.        Mood and Affect: Mood and affect normal.        Speech: Speech normal.        Behavior: Behavior normal. Behavior is cooperative.        Thought Content: Thought content normal.        Cognition and Memory: Cognition and memory normal.        Judgment: Judgment normal.     Results for orders placed or performed in visit on 05/14/19  Bayer DCA Hb A1c Waived  Result Value Ref Range   HB A1C (BAYER DCA - WAIVED) 8.8 (H) <7.0 %  Lipid panel  Result Value Ref Range   Cholesterol, Total 166 100 - 199 mg/dL   Triglycerides 259 (H) 0 - 149 mg/dL   HDL 35 (L) >39 mg/dL   VLDL Cholesterol Cal 52 (H) 5 - 40 mg/dL   LDL Calculated 79 0 - 99 mg/dL   Chol/HDL Ratio 4.7 (H) 0.0 - 4.4 ratio  Thyroid Panel With TSH  Result Value Ref Range   TSH 0.177 (L) 0.450 - 4.500 uIU/mL   T4, Total 9.8 4.5 - 12.0 ug/dL   T3 Uptake Ratio 32 24 - 39 %   Free Thyroxine Index 3.1 1.2 - 4.9  CMP14+EGFR  Result Value Ref Range   Glucose 247 (H) 65 - 99 mg/dL   BUN 30 (H) 8 - 27 mg/dL   Creatinine, Ser 1.39 (H) 0.57 - 1.00 mg/dL   GFR calc non Af Amer 38 (L) >59 mL/min/1.73   GFR calc Af Amer 44 (L) >59 mL/min/1.73   BUN/Creatinine Ratio 22 12 - 28   Sodium 141 134 - 144 mmol/L   Potassium 4.3 3.5 - 5.2 mmol/L   Chloride 106 96 - 106 mmol/L   CO2 19 (L) 20 - 29 mmol/L   Calcium 9.4 8.7 - 10.3 mg/dL   Total Protein 6.4 6.0 - 8.5 g/dL   Albumin 3.9 3.7 - 4.7 g/dL   Globulin, Total 2.5 1.5 - 4.5 g/dL    Albumin/Globulin Ratio 1.6 1.2 - 2.2   Bilirubin Total 0.2 0.0 - 1.2 mg/dL   Alkaline Phosphatase 59 39 - 117 IU/L   AST 14 0 - 40 IU/L   ALT 14 0 - 32 IU/L  CBC with Differential/Platelet  Result Value Ref Range   WBC 8.6 3.4 - 10.8 x10E3/uL   RBC 4.22 3.77 - 5.28 x10E6/uL   Hemoglobin 12.8 11.1 - 15.9 g/dL   Hematocrit 38.1 34.0 - 46.6 %   MCV 90 79 - 97 fL   MCH 30.3 26.6 - 33.0 pg   MCHC 33.6 31.5 - 35.7 g/dL   RDW 13.8 11.7 - 15.4 %   Platelets 228 150 - 450 x10E3/uL   Neutrophils 60 Not Estab. %   Lymphs 27 Not Estab. %   Monocytes 8 Not Estab. %   Eos 3 Not Estab. %   Basos 1 Not Estab. %   Neutrophils Absolute 5.3 1.4 - 7.0 x10E3/uL   Lymphocytes Absolute 2.3 0.7 - 3.1 x10E3/uL   Monocytes Absolute 0.7 0.1 - 0.9 x10E3/uL   EOS (ABSOLUTE) 0.2 0.0 - 0.4 x10E3/uL   Basophils Absolute 0.1 0.0 - 0.2 x10E3/uL   Immature Granulocytes 1 Not Estab. %   Immature Grans (Abs) 0.1 0.0 - 0.1 x10E3/uL       Pertinent labs & imaging results that were available during my care of the patient were reviewed by me and considered in my medical decision making.  Assessment & Plan:  Meredith Shaw was seen today for medical management of chronic issues, hyperlipidemia, diabetes, hypertension and hypothyroidism.  Diagnoses and all orders for this visit:  Type 2 diabetes mellitus with complication, without long-term current use of insulin (HCC) A1C 8.9 today. The importance of diet and exercise discussed during OV. Pt unable to tolerate Metformin at doses higher than 500 mg twice daily. Will add invokana 100 mg daily. Follow up in 3 months for reevaluation.  -     CBC with Differential/Platelet -     Bayer DCA Hb A1c Waived -     metFORMIN (GLUCOPHAGE) 500 MG tablet; Take 1 tablet (500 mg total) by mouth 2 (two) times daily with a meal. -     canagliflozin (INVOKANA) 100 MG TABS tablet; Take 1 tablet (100 mg total) by mouth daily before breakfast.  Hypertension associated with type 2 diabetes  mellitus ([place]) BP well controlled at home, log provided by pt and scanned into chart. Changes were not made in regimen today. Goal BP is 130/80. Pt aware to report any persistent high or low readings. DASH diet and exercise encouraged. Exercise at least 150 minutes per week and increase as tolerated. Goal BMI > 25. Stress management encouraged. Avoid nicotine and tobacco product use. Avoid excessive alcohol and NSAID's. Avoid more than 2000 mg of sodium daily. Medications as prescribed. Follow up as scheduled.  -     CBC with Differential/Platelet -     CMP14+EGFR  Hyperlipidemia associated with type 2 diabetes mellitus ([place]) Diet encouraged - increase intake of fresh fruits and vegetables, increase intake of lean proteins. Bake, broil, or grill foods. Avoid fried, greasy, and fatty foods. Avoid fast foods. Increase intake of fiber-rich whole grains. Exercise encouraged - at least 150 minutes per week and advance as tolerated.  Goal BMI < 25. Continue medications as prescribed. Follow up in 3-6 months as discussed.  -     CBC with Differential/Platelet -     Lipid panel -     pravastatin (PRAVACHOL) 40 MG tablet; Take 1 tablet (40 mg total) by mouth at bedtime.  Acquired hypothyroidism Thyroid disease has been fairly controlled. Labs are pending. Adjustments to regimen will be made if warranted. Make sure to take medications on an empty stomach with a full  glass of water. Make sure to avoid vitamins or supplements for at least 4 hours before and 4 hours after taking medications. Repeat labs in 3 months if adjustments are made and in 6 months if stable.   -     CBC with Differential/Platelet -     Thyroid Panel With TSH  CKD stage 3 due to type 2 diabetes mellitus ([place]) Labs pending. Report any new or concerning symptoms.  -     CBC with Differential/Platelet -     CMP14+EGFR  Need for immunization against influenza -     Flu Vaccine QUAD High Dose([place])     Continue all other maintenance  medications.  Follow up plan: Return in about 3 months (around 11/15/2019), or if symptoms worsen or fail to improve, for DM.  Continue healthy lifestyle choices, including diet (rich in fruits, vegetables, and lean proteins, and low in salt and simple carbohydrates) and exercise (at least 30 minutes of moderate physical activity daily).  Educational handout given for hypothyroidism  The above assessment and management plan was discussed with the patient. The patient verbalized understanding of and has agreed to the management plan. Patient is aware to call the clinic if they develop any new symptoms or if symptoms persist or worsen. Patient is aware when to return to the clinic for a follow-up visit. Patient educated on when it is appropriate to go to the emergency department.   Monia Pouch, FNP-C [facility] [phone]

## 2019-08-16 ENCOUNTER — Ambulatory Visit (INDEPENDENT_AMBULATORY_CARE_PROVIDER_SITE_OTHER): Payer: Medicare Other | Admitting: *Deleted

## 2019-08-16 DIAGNOSIS — Z0000 Encounter for general adult medical examination without abnormal findings: Secondary | ICD-10-CM | POA: Diagnosis not present

## 2019-08-16 LAB — THYROID PANEL WITH TSH
Free Thyroxine Index: 2.6 (ref 1.2–4.9)
T3 Uptake Ratio: 30 % (ref 24–39)
T4, Total: 8.8 ug/dL (ref 4.5–12.0)
TSH: 0.616 u[IU]/mL (ref 0.450–4.500)

## 2019-08-16 LAB — CMP14+EGFR
ALT: 16 IU/L (ref 0–32)
AST: 18 IU/L (ref 0–40)
Albumin/Globulin Ratio: 1.5 (ref 1.2–2.2)
Albumin: 3.9 g/dL (ref 3.7–4.7)
Alkaline Phosphatase: 64 IU/L (ref 39–117)
BUN/Creatinine Ratio: 21 (ref 12–28)
BUN: 27 mg/dL (ref 8–27)
Bilirubin Total: 0.3 mg/dL (ref 0.0–1.2)
CO2: 19 mmol/L — ABNORMAL LOW (ref 20–29)
Calcium: 9 mg/dL (ref 8.7–10.3)
Chloride: 106 mmol/L (ref 96–106)
Creatinine, Ser: 1.29 mg/dL — ABNORMAL HIGH (ref 0.57–1.00)
GFR calc Af Amer: 47 mL/min/{1.73_m2} — ABNORMAL LOW (ref >59)
GFR calc non Af Amer: 41 mL/min/{1.73_m2} — ABNORMAL LOW (ref >59)
Globulin, Total: 2.6 g/dL (ref 1.5–4.5)
Glucose: 336 mg/dL — ABNORMAL HIGH (ref 65–99)
Potassium: 3.9 mmol/L (ref 3.5–5.2)
Sodium: 141 mmol/L (ref 134–144)
Total Protein: 6.5 g/dL (ref 6.0–8.5)

## 2019-08-16 LAB — CBC WITH DIFFERENTIAL/PLATELET
Basophils Absolute: 0.1 10*3/uL (ref 0.0–0.2)
Basos: 1 %
EOS (ABSOLUTE): 0.3 10*3/uL (ref 0.0–0.4)
Eos: 3 %
Hematocrit: 41.2 % (ref 34.0–46.6)
Hemoglobin: 13.7 g/dL (ref 11.1–15.9)
Immature Grans (Abs): 0 10*3/uL (ref 0.0–0.1)
Immature Granulocytes: 0 %
Lymphocytes Absolute: 2.2 10*3/uL (ref 0.7–3.1)
Lymphs: 23 %
MCH: 30.6 pg (ref 26.6–33.0)
MCHC: 33.3 g/dL (ref 31.5–35.7)
MCV: 92 fL (ref 79–97)
Monocytes Absolute: 0.7 10*3/uL (ref 0.1–0.9)
Monocytes: 7 %
Neutrophils Absolute: 6.1 10*3/uL (ref 1.4–7.0)
Neutrophils: 66 %
Platelets: 246 10*3/uL (ref 150–450)
RBC: 4.48 x10E6/uL (ref 3.77–5.28)
RDW: 13.5 % (ref 11.7–15.4)
WBC: 9.5 10*3/uL (ref 3.4–10.8)

## 2019-08-16 LAB — LIPID PANEL
Chol/HDL Ratio: 4.3 ratio (ref 0.0–4.4)
Cholesterol, Total: 165 mg/dL (ref 100–199)
HDL: 38 mg/dL — ABNORMAL LOW (ref >39)
LDL Chol Calc (NIH): 86 mg/dL (ref 0–99)
Triglycerides: 244 mg/dL — ABNORMAL HIGH (ref 0–149)
VLDL Cholesterol Cal: 41 mg/dL — ABNORMAL HIGH (ref 5–40)

## 2019-08-16 NOTE — Patient Instructions (Signed)
Preventive Care 38 Years and Older, Female Preventive care refers to lifestyle choices and visits with your health care provider that can promote health and wellness. This includes:  A yearly physical exam. This is also called an annual well check.  Regular dental and eye exams.  Immunizations.  Screening for certain conditions.  Healthy lifestyle choices, such as diet and exercise. What can I expect for my preventive care visit? Physical exam Your health care provider will check:  Height and weight. These may be used to calculate body mass index (BMI), which is a measurement that tells if you are at a healthy weight.  Heart rate and blood pressure.  Your skin for abnormal spots. Counseling Your health care provider may ask you questions about:  Alcohol, tobacco, and drug use.  Emotional well-being.  Home and relationship well-being.  Sexual activity.  Eating habits.  History of falls.  Memory and ability to understand (cognition).  Work and work Statistician.  Pregnancy and menstrual history. What immunizations do I need?  Influenza (flu) vaccine  This is recommended every year. Tetanus, diphtheria, and pertussis (Tdap) vaccine  You may need a Td booster every 10 years. Varicella (chickenpox) vaccine  You may need this vaccine if you have not already been vaccinated. Zoster (shingles) vaccine  You may need this after age 33. Pneumococcal conjugate (PCV13) vaccine  One dose is recommended after age 33. Pneumococcal polysaccharide (PPSV23) vaccine  One dose is recommended after age 72. Measles, mumps, and rubella (MMR) vaccine  You may need at least one dose of MMR if you were born in 1957 or later. You may also need a second dose. Meningococcal conjugate (MenACWY) vaccine  You may need this if you have certain conditions. Hepatitis A vaccine  You may need this if you have certain conditions or if you travel or work in places where you may be exposed  to hepatitis A. Hepatitis B vaccine  You may need this if you have certain conditions or if you travel or work in places where you may be exposed to hepatitis B. Haemophilus influenzae type b (Hib) vaccine  You may need this if you have certain conditions. You may receive vaccines as individual doses or as more than one vaccine together in one shot (combination vaccines). Talk with your health care provider about the risks and benefits of combination vaccines. What tests do I need? Blood tests  Lipid and cholesterol levels. These may be checked every 5 years, or more frequently depending on your overall health.  Hepatitis C test.  Hepatitis B test. Screening  Lung cancer screening. You may have this screening every year starting at age 39 if you have a 30-pack-year history of smoking and currently smoke or have quit within the past 15 years.  Colorectal cancer screening. All adults should have this screening starting at age 36 and continuing until age 15. Your health care provider may recommend screening at age 23 if you are at increased risk. You will have tests every 1-10 years, depending on your results and the type of screening test.  Diabetes screening. This is done by checking your blood sugar (glucose) after you have not eaten for a while (fasting). You may have this done every 1-3 years.  Mammogram. This may be done every 1-2 years. Talk with your health care provider about how often you should have regular mammograms.  BRCA-related cancer screening. This may be done if you have a family history of breast, ovarian, tubal, or peritoneal cancers.  Other tests  Sexually transmitted disease (STD) testing.  Bone density scan. This is done to screen for osteoporosis. You may have this done starting at age 76. Follow these instructions at home: Eating and drinking  Eat a diet that includes fresh fruits and vegetables, whole grains, lean protein, and low-fat dairy products. Limit  your intake of foods with high amounts of sugar, saturated fats, and salt.  Take vitamin and mineral supplements as recommended by your health care provider.  Do not drink alcohol if your health care provider tells you not to drink.  If you drink alcohol: ? Limit how much you have to 0-1 drink a day. ? Be aware of how much alcohol is in your drink. In the [place] one drink equals one 12 oz bottle of beer (355 mL), one 5 oz glass of wine (148 mL), or one 1 oz glass of hard liquor (44 mL). Lifestyle  Take daily care of your teeth and gums.  Stay active. Exercise for at least 30 minutes on 5 or more days each week.  Do not use any products that contain nicotine or tobacco, such as cigarettes, e-cigarettes, and chewing tobacco. If you need help quitting, ask your health care provider.  If you are sexually active, practice safe sex. Use a condom or other form of protection in order to prevent STIs (sexually transmitted infections).  Talk with your health care provider about taking a low-dose aspirin or statin. What's next?  Go to your health care provider once a year for a well check visit.  Ask your health care provider how often you should have your eyes and teeth checked.  Stay up to date on all vaccines. This information is not intended to replace advice given to you by your health care provider. Make sure you discuss any questions you have with your health care provider. Document Released: 10/24/2015 Document Revised: 09/21/2018 Document Reviewed: 09/21/2018 Elsevier Patient Education  2020 [employer].

## 2019-08-16 NOTE — Progress Notes (Addendum)
MEDICARE ANNUAL WELLNESS VISIT  08/16/2019  Telephone Visit Disclaimer This Medicare AWV was conducted by telephone due to national recommendations for restrictions regarding the COVID-19 Pandemic (e.g. social distancing).  I verified, using two identifiers, that I am speaking with Meredith Shaw or their authorized healthcare agent. I discussed the limitations, risks, security, and privacy concerns of performing an evaluation and management service by telephone and the potential availability of an in-person appointment in the future. The patient expressed understanding and agreed to proceed.   Subjective:  TWANIA BUJAK is a 73 y.o. female patient of Rakes, Connye Burkitt, FNP who had a Medicare Annual Wellness Visit today via telephone. Yezenia is Retired and lives with their spouse. she has 2 children. she reports that she is socially active and does interact with friends/family regularly. she is minimally physically active and enjoys reading and spending time with her family.  Patient Care Team: Baruch Gouty, FNP as PCP - General (Family Medicine) Mcarthur Rossetti, MD as Consulting Physician (Orthopedic Surgery) Rana Snare, MD as Consulting Physician (Urology) Corey Harold, MD as Consulting Physician  Advanced Directives 08/16/2019 08/21/2018 08/08/2018 05/23/2018 02/10/2018 03/16/2017 02/14/2016  Does Patient Have a Medical Advance Directive? Yes Yes Yes No Yes Yes No  Type of Paramedic of [place];Living will [place];Living will [place];Living will - Living will [place];Living will -  Does patient want to make changes to medical advance directive? No - Patient declined No - Patient declined No - Patient declined - No - Patient declined No - Patient declined -  Copy of [place] in Chart? No - copy requested No - copy requested No - copy requested - - No - copy requested -  Would  patient like information on creating a medical advance directive? - - - No - Patient declined - - -  Pre-existing out of facility DNR order (yellow form or pink MOST form) - - - - - - -    Hospital Utilization Over the Past 12 Months: # of hospitalizations or ER visits: 1 # of surgeries: 2  Review of Systems    Patient reports that her overall health is unchanged compared to last year.  History obtained from chart review  Patient Reported Readings (BP, Pulse, CBG, Weight, etc) none  Pain Assessment Pain : No/denies pain     Current Medications & Allergies (verified) Allergies as of 08/16/2019      Reactions   Penicillins Itching, Rash      Medication List       Accurate as of August 16, 2019 10:57 AM. If you have any questions, ask your nurse or doctor.        amLODipine 5 MG tablet Commonly known as: NORVASC Take 1 tablet (5 mg total) by mouth daily.   canagliflozin 100 MG Tabs tablet Commonly known as: INVOKANA Take 1 tablet (100 mg total) by mouth daily before breakfast.   cholecalciferol 1000 units tablet Commonly known as: VITAMIN D Take 5,000 Units by mouth daily.   denosumab 60 MG/ML Sosy injection Commonly known as: PROLIA Inject 60 mg into the skin every 6 (six) months.   glucose blood test strip Commonly known as: OneTouch Verio Use as instructed   levothyroxine 137 MCG tablet Commonly known as: Synthroid Take 1 tablet (137 mcg total) by mouth every other day. Before breakfast   lisinopril 20 MG tablet Commonly known as: ZESTRIL Take  1 tablet (20 mg total) by mouth daily.   metFORMIN 500 MG tablet Commonly known as: GLUCOPHAGE Take 1 tablet (500 mg total) by mouth 2 (two) times daily with a meal.   metoprolol tartrate 100 MG tablet Commonly known as: LOPRESSOR Take 1 tablet (100 mg total) by mouth 2 (two) times daily.   naproxen sodium 220 MG tablet Commonly known as: ALEVE Take 220 mg by mouth 2 (two) times daily as needed (for pain).    onetouch ultrasoft lancets Use as instructed   OneTouch Verio w/Device Kit 1 each by Does not apply route daily.   pravastatin 40 MG tablet Commonly known as: PRAVACHOL Take 1 tablet (40 mg total) by mouth at bedtime.       History (reviewed): Past Medical History:  Diagnosis Date  . Arthritis    OA AND PAIN BOTH KNEES -RIGHT KNEE HURTS WORSE THAN LEFT  . Cataract   . Diabetes mellitus    ORAL MEDICATION - NO INSULIN  . Hypertension   . Hypertensive retinopathy    OU  . Hypothyroidism   . Kidney stones    NONE AT PRESENT TIME THAT PT IS AWARE OF  . Osteoporosis    Past Surgical History:  Procedure Laterality Date  . ABDOMINAL HYSTERECTOMY    . CATARACT EXTRACTION    . CHOLECYSTECTOMY  1972  . COLONOSCOPY    . EYE SURGERY     RIGHT CATARACT EXTRACTON WITH LENS IMPLANT  . GROWTH REMOVED FROM THYROID    . INTRAMEDULLARY (IM) NAIL INTERTROCHANTERIC Left 02/10/2018   Procedure: INTRAMEDULLARY (IM) NAIL INTERTROCHANTRIC;  Surgeon: Mcarthur Rossetti, MD;  Location: [facility];  Service: Orthopedics;  Laterality: Left;  . INTRAMEDULLARY (IM) NAIL INTERTROCHANTERIC Right 08/20/2018   Procedure: ORIF RIGHT HIP INTERTROC FRACTURE;  Surgeon: Mcarthur Rossetti, MD;  Location: [facility];  Service: Orthopedics;  Laterality: Right;  . left hip surgery   02/2018  . ORIF HIP FRACTURE Right 08/20/2018  . PERCUTANEOUS NEPHROLITHOTOMY    . TOTAL KNEE ARTHROPLASTY  06/09/2012   Procedure: TOTAL KNEE ARTHROPLASTY;  Surgeon: Mcarthur Rossetti, MD;  Location: [facility];  Service: Orthopedics;  Laterality: Right;  Right Total Knee Arthroplasty  . TOTAL KNEE ARTHROPLASTY Left 04/09/2014   Procedure: LEFT TOTAL KNEE ARTHROPLASTY;  Surgeon: Mcarthur Rossetti, MD;  Location: [facility];  Service: Orthopedics;  Laterality: Left;  . TUBAL LIGATION     Family History  Problem Relation Age of Onset  . Heart disease Mother   . Breast cancer Mother        98's  . Stroke Father   . Heart  disease Father   . Healthy Daughter   . Healthy Son   . Liver disease Maternal Grandmother        gall stones imbedded in liver - blead to death when in surgery   . Pneumonia Maternal Grandfather   . Diabetes Paternal Grandmother    Social History   Socioeconomic History  . Marital status: Married    Spouse name: Berneta Sages   . Number of children: 2  . Years of education: 14  . Highest education level: Some college, no degree  Occupational History  . Occupation: retired     Fish farm manager: [employer]    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    Comment: bank - 38yr  Social Needs  . Financial resource strain: Not hard at all  . Food insecurity    Worry: Never true    Inability: Never true  .  Transportation needs    Medical: No    Non-medical: No  Tobacco Use  . Smoking status: Never Smoker  . Smokeless tobacco: Never Used  Substance and Sexual Activity  . Alcohol use: No  . Drug use: No  . Sexual activity: Yes    Birth control/protection: Surgical  Lifestyle  . Physical activity    Days per week: 7 days    Minutes per session: 30 min  . Stress: Not at all  Relationships  . Social connections    Talks on phone: More than three times a week    Gets together: More than three times a week    Attends religious service: More than 4 times per year    Active member of club or organization: Yes    Attends meetings of clubs or organizations: More than 4 times per year    Relationship status: Married  Other Topics Concern  . Not on file  Social History Narrative  . Not on file    Activities of Daily Living In your present state of health, do you have any difficulty performing the following activities: 08/16/2019 08/21/2018  Hearing? N -  Vision? N -  Comment wears glasses-currently going to the eye doctor every 4 weeks for injections in the right eye -  Difficulty concentrating or making decisions? N -  Walking or climbing stairs? N -  Comment pt has had to have surgery on both hips in the  past 18 months due to falls, recently stopped using walker and is able to ambulate without use of walker/cane -  Dressing or bathing? N -  Doing errands, shopping? Tempie Donning  Comment husband or daughter will usually bring her to all appointments or to run errands -  Conservation officer, nature and eating ? Y -  Comment husband helps prepare meals-she can feed herself -  Using the Toilet? N -  In the past six months, have you accidently leaked urine? N -  Do you have problems with loss of bowel control? N -  Managing your Medications? N -  Managing your Finances? N -  Housekeeping or managing your Housekeeping? Y -  Comment husband helps with some of the housekeeping -  Some recent data might be hidden    Patient Education/ Literacy How often do you need to have someone help you when you read instructions, pamphlets, or other written materials from your doctor or pharmacy?: 1 - Never What is the last grade level you completed in school?: some college-[employer]  Exercise Current Exercise Habits: Home exercise routine, Type of exercise: walking, Time (Minutes): 30, Frequency (Times/Week): 7, Weekly Exercise (Minutes/Week): 210, Intensity: Mild, Exercise limited by: orthopedic condition(s)  Diet Patient reports consuming 2 meals a day and 1 snack(s) a day Patient reports that her primary diet is: Regular Patient reports that she does have regular access to food.   Depression Screen PHQ 2/9 Scores 08/16/2019 08/15/2019 05/14/2019 11/09/2018 08/09/2018 08/08/2018 06/06/2018  PHQ - 2 Score 0 0 0 0 0 0 0  PHQ- 9 Score - - - - - - -     Fall Risk Fall Risk  08/16/2019 08/15/2019 05/14/2019 11/09/2018 08/09/2018  Falls in the past year? 1 1 0 1 Yes  Number falls in past yr: 0 1 - 1 1  Injury with Fall? 1 1 - 1 Yes  Comment - - - May 2019 & November 2019 right hip fracture/left hip fracture -  Risk Factor Category  - - - - High  Fall Risk  Risk for fall due to : Impaired mobility History of fall(s);Orthopedic  patient - - History of fall(s)  Follow up Falls prevention discussed Falls prevention discussed - - -  Comment Get rid of all throw rugs in the house, adequate lighting in the walkways and grab bars in the bathroom - - - -     Objective:  Meredith Shaw seemed alert and oriented and she participated appropriately during our telephone visit.  Blood Pressure Weight BMI  BP Readings from Last 3 Encounters:  08/15/19 (!) 158/83  05/14/19 (!) 154/83  11/09/18 (!) 153/80   Wt Readings from Last 3 Encounters:  08/15/19 175 lb (79.4 kg)  05/14/19 174 lb (78.9 kg)  11/09/18 174 lb (78.9 kg)   BMI Readings from Last 1 Encounters:  08/15/19 27.41 kg/m    *Unable to obtain current vital signs, weight, and BMI due to telephone visit type  Hearing/Vision  . Laretta did not seem to have difficulty with hearing/understanding during the telephone conversation . Reports that she has had a formal eye exam by an eye care professional within the past year . Reports that she has not had a formal hearing evaluation within the past year *Unable to fully assess hearing and vision during telephone visit type  Cognitive Function: 6CIT Screen 08/16/2019  What Year? 0 points  What month? 0 points  What time? 0 points  Count back from 20 2 points  Months in reverse 0 points  Repeat phrase 0 points  Total Score 2   (Normal:0-7, Significant for Dysfunction: >8)  Normal Cognitive Function Screening: Yes   Immunization & Health Maintenance Record Immunization History  Administered Date(s) Administered  . DT 12/04/2004  . Fluad Quad(high Dose 65+) 08/15/2019  . Influenza, High Dose Seasonal PF 08/26/2016, 08/16/2017, 08/08/2018  . Influenza,inj,Quad PF,6+ Mos 08/28/2015  . Influenza-Unspecified 12/04/2004  . Pneumococcal Conjugate-13 12/04/2004, 12/17/2014  . Pneumococcal Polysaccharide-23 07/05/2017    Health Maintenance  Topic Date Due  . COLON CANCER SCREENING ANNUAL FOBT  02/12/2020  (Originally 06/30/2019)  . TETANUS/TDAP  05/13/2020 (Originally 02/09/2019)  . OPHTHALMOLOGY EXAM  10/27/2019  . HEMOGLOBIN A1C  02/12/2020  . FOOT EXAM  05/13/2020  . MAMMOGRAM  08/16/2020  . COLONOSCOPY  01/14/2029  . INFLUENZA VACCINE  Completed  . DEXA SCAN  Completed  . Hepatitis C Screening  Completed  . PNA vac Low Risk Adult  Completed       Assessment  This is a routine wellness examination for Meredith Shaw.  Health Maintenance: Due or Overdue There are no preventive care reminders to display for this patient.  Meredith Shaw does not need a referral for Community Assistance: Care Management:   no Social Work:    no Prescription Assistance:  no Nutrition/Diabetes Education:  no   Plan:  Personalized Goals Goals Addressed            This Visit's Progress   . DIET - INCREASE WATER INTAKE       Try to drink 6-8 glasses of water daily      Personalized Health Maintenance & Screening Recommendations  Td vaccine Advanced directives: has an advanced directive - a copy HAS NOT been provided. Shingles vaccine  Lung Cancer Screening Recommended: no (Low Dose CT Chest recommended if Age 21-80 years, 30 pack-year currently smoking OR have quit w/in past 15 years) Hepatitis C Screening recommended: no HIV Screening recommended: no  Advanced Directives: Written information was not prepared per patient's  request.  Referrals & Orders No orders of the defined types were placed in this encounter.   Follow-up Plan . Follow-up with Baruch Gouty, FNP as planned . Consider TDAP and Shingles vaccines at your next visit with your PCP . Bring a copy of your Advanced Directives in for our records   I have personally reviewed and noted the following in the patient's chart:   . Medical and social history . Use of alcohol, tobacco or illicit drugs  . Current medications and supplements . Functional ability and status . Nutritional status . Physical activity .  Advanced directives . List of other physicians . Hospitalizations, surgeries, and ER visits in previous 12 months . Vitals . Screenings to include cognitive, depression, and falls . Referrals and appointments  In addition, I have reviewed and discussed with Meredith Shaw certain preventive protocols, quality metrics, and best practice recommendations. A written personalized care plan for preventive services as well as general preventive health recommendations is available and can be mailed to the patient at her request.      Sefora Tietje, Donny Pique, LPN  93/11/[id number]  I have reviewed and agree with the above AWV documentation.   Mary-Margaret Hassell Done, FNP

## 2019-08-28 NOTE — Progress Notes (Signed)
[facility] Note  09/10/2019     CHIEF COMPLAINT Patient presents for Retina Follow Up   HISTORY OF PRESENT ILLNESS: Meredith Shaw is a 73 y.o. female who presents to the clinic today for:   HPI    Retina Follow Up    Patient presents with  CRVO/BRVO.  In right eye.  This started 4 weeks ago.  Severity is moderate.  I, the attending physician,  performed the HPI with the patient and updated documentation appropriately.          Comments    Patient here for 4 weeks retina follow up for BRVO OD. Patient states vision doing good. No eye pain.       Last edited by Bernarda Caffey, MD on 09/10/2019 10:08 AM. (History)    pt states she cannot tell if her vision has changed since her last visit  Referring physician: Marin Comment [place], [place],  [place] [address]  HISTORICAL INFORMATION:   Selected notes from the MEDICAL RECORD NUMBER Referred by Dr. Maryjane Hurter for DM exam LEE: 01.16.20 (M. Le) [BCVA: OD: 20/40 OS: 20/50-3] Ocular Hx-PDR OU, macular edema OU, cataracts OU PMH-DM (takes metformin), arthritis, HTN, hypothyroidism    CURRENT MEDICATIONS: No current outpatient medications on file. (Ophthalmic Drugs)   No current facility-administered medications for this visit.  (Ophthalmic Drugs)   Current Outpatient Medications (Other)  Medication Sig  . amLODipine (NORVASC) 5 MG tablet Take 1 tablet (5 mg total) by mouth daily.  . Blood Glucose Monitoring Suppl (ONETOUCH VERIO) w/Device KIT 1 each by Does not apply route daily.  . canagliflozin (INVOKANA) 100 MG TABS tablet Take 1 tablet (100 mg total) by mouth daily before breakfast.  . cholecalciferol (VITAMIN D) 1000 units tablet Take 5,000 Units by mouth daily.   Marland Kitchen denosumab (PROLIA) 60 MG/ML SOSY injection Inject 60 mg into the skin every 6 (six) months.  Marland Kitchen glucose blood (ONETOUCH VERIO) test strip Use as instructed  . Lancets (ONETOUCH ULTRASOFT) lancets Use as instructed  .  levothyroxine (SYNTHROID) 137 MCG tablet Take 1 tablet (137 mcg total) by mouth every other day. Before breakfast  . lisinopril (ZESTRIL) 20 MG tablet Take 1 tablet (20 mg total) by mouth daily.  . metFORMIN (GLUCOPHAGE) 500 MG tablet Take 1 tablet (500 mg total) by mouth 2 (two) times daily with a meal.  . metoprolol tartrate (LOPRESSOR) 100 MG tablet Take 1 tablet (100 mg total) by mouth 2 (two) times daily.  . naproxen sodium (ALEVE) 220 MG tablet Take 220 mg by mouth 2 (two) times daily as needed (for pain).  . pravastatin (PRAVACHOL) 40 MG tablet Take 1 tablet (40 mg total) by mouth at bedtime.   Current Facility-Administered Medications (Other)  Medication Route  . Bevacizumab (AVASTIN) SOLN 1.25 mg Intravitreal  . Bevacizumab (AVASTIN) SOLN 1.25 mg Intravitreal  . denosumab (PROLIA) injection 60 mg Subcutaneous      REVIEW OF SYSTEMS: ROS    Positive for: Genitourinary, Musculoskeletal, Endocrine, Eyes   Negative for: Constitutional, Gastrointestinal, Neurological, Skin, HENT, Cardiovascular, Respiratory, Psychiatric, Allergic/Imm, Heme/Lymph   Last edited by Theodore Demark, COA on 09/10/2019  9:20 AM. (History)       ALLERGIES Allergies  Allergen Reactions  . Penicillins Itching and Rash    PAST MEDICAL HISTORY Past Medical History:  Diagnosis Date  . Arthritis    OA AND PAIN BOTH KNEES -RIGHT KNEE HURTS WORSE THAN LEFT  .  Cataract   . Diabetes mellitus    ORAL MEDICATION - NO INSULIN  . Hypertension   . Hypertensive retinopathy    OU  . Hypothyroidism   . Kidney stones    NONE AT PRESENT TIME THAT PT IS AWARE OF  . Osteoporosis    Past Surgical History:  Procedure Laterality Date  . ABDOMINAL HYSTERECTOMY    . CATARACT EXTRACTION    . CHOLECYSTECTOMY  1972  . COLONOSCOPY    . EYE SURGERY     RIGHT CATARACT EXTRACTON WITH LENS IMPLANT  . GROWTH REMOVED FROM THYROID    . INTRAMEDULLARY (IM) NAIL INTERTROCHANTERIC Left 02/10/2018   Procedure:  INTRAMEDULLARY (IM) NAIL INTERTROCHANTRIC;  Surgeon: Mcarthur Rossetti, MD;  Location: [facility];  Service: Orthopedics;  Laterality: Left;  . INTRAMEDULLARY (IM) NAIL INTERTROCHANTERIC Right 08/20/2018   Procedure: ORIF RIGHT HIP INTERTROC FRACTURE;  Surgeon: Mcarthur Rossetti, MD;  Location: [facility];  Service: Orthopedics;  Laterality: Right;  . left hip surgery   02/2018  . ORIF HIP FRACTURE Right 08/20/2018  . PERCUTANEOUS NEPHROLITHOTOMY    . TOTAL KNEE ARTHROPLASTY  06/09/2012   Procedure: TOTAL KNEE ARTHROPLASTY;  Surgeon: Mcarthur Rossetti, MD;  Location: [facility];  Service: Orthopedics;  Laterality: Right;  Right Total Knee Arthroplasty  . TOTAL KNEE ARTHROPLASTY Left 04/09/2014   Procedure: LEFT TOTAL KNEE ARTHROPLASTY;  Surgeon: Mcarthur Rossetti, MD;  Location: [facility];  Service: Orthopedics;  Laterality: Left;  . TUBAL LIGATION      FAMILY HISTORY Family History  Problem Relation Age of Onset  . Heart disease Mother   . Breast cancer Mother        48's  . Stroke Father   . Heart disease Father   . Healthy Daughter   . Healthy Son   . Liver disease Maternal Grandmother        gall stones imbedded in liver - blead to death when in surgery   . Pneumonia Maternal Grandfather   . Diabetes Paternal Grandmother     SOCIAL HISTORY Social History   Tobacco Use  . Smoking status: Never Smoker  . Smokeless tobacco: Never Used  Substance Use Topics  . Alcohol use: No  . Drug use: No         OPHTHALMIC EXAM:  Base Eye Exam    Visual Acuity (Snellen - Linear)      Right Left   Dist cc 20/30 -1 20/25   Dist ph cc NI NI   Correction: Glasses       Tonometry (Tonopen, 9:16 AM)      Right Left   Pressure 17 16       Pupils      Dark Light Shape React APD   Right 3 2 Round Brisk None   Left 3 2 Round Brisk None       Visual Fields (Counting fingers)      Left Right    Full Full       Extraocular Movement      Right Left    Full, Ortho Full,  Ortho       Neuro/Psych    Oriented x3: Yes   Mood/Affect: Normal       Dilation    Both eyes: 1.0% Mydriacyl, 2.5% Phenylephrine @ 9:16 AM        Slit Lamp and Fundus Exam    Slit Lamp Exam      Right Left   Lids/Lashes Dermatochalasis - upper  lid, Meibomian gland dysfunction Dermatochalasis - upper lid, Meibomian gland dysfunction   Conjunctiva/Sclera White and quiet White and quiet   Cornea 1+ Punctate epithelial erosions, Debris in tear film - improved, +ABMD 1+ Punctate epithelial erosions, Debris in tear film   Anterior Chamber deep and clear deep and clear, narrow temporal angle   Iris Round and dilated, No NVI Round and dilated, No NVI   Lens PC IOL in good position, trace Posterior capsular opacification PC IOL in good position with open PC   Vitreous mild Vitreous syneresis, Posterior vitreous detachment, silicone oil bubble mild Vitreous syneresis       Fundus Exam      Right Left   Disc compact, mild tilt, temporal PPA, mild pallor compact, Pink and Sharp, temporal Peripapillary atrophy   C/D Ratio 0.1 0.2   Macula blunted foveal reflex, edema improved, improving IRH and CWS superior macula Flat, good foveal reflex, Retinal pigment epithelial mottling, No edema, rare MA temporal macula   Vessels Vascular attenuation, Tortuous Vascular attenuation, Tortuous   Periphery Attached, scattered reticular degeneration, temporal IRH resolving Attached, reticular degeneration, mild, scattered MA/DBH, focal pigment clumping temporal periphery        Refraction    Wearing Rx      Sphere Cylinder Axis Add   Right [place] +0.50 020 +2.50   Left +0.25 +0.25 130 +2.50   Type: prog  New glasses          IMAGING AND PROCEDURES  Imaging and Procedures for @TODAY @  OCT, Retina - OU - Both Eyes       Right Eye Quality was good. Central Foveal Thickness: 289. Progression has improved. Findings include abnormal foveal contour, no SRF, intraretinal fluid, cystoid macular  edema (interval improvement in IRF/IRHM).   Left Eye Quality was good. Central Foveal Thickness: 282. Progression has been stable. Findings include normal foveal contour, no IRF, no SRF, vitreomacular adhesion .   Notes *Images captured and stored on drive  Diagnosis / Impression:  OD: superior BRVO w/ CME -- interval improvement in IRF/IRHM OS: NFP; no IRF/SRF--stable  Clinical management:  See below  Abbreviations: NFP - Normal foveal profile. CME - cystoid macular edema. PED - pigment epithelial detachment. IRF - intraretinal fluid. SRF - subretinal fluid. EZ - ellipsoid zone. ERM - epiretinal membrane. ORA - outer retinal atrophy. ORT - outer retinal tubulation. SRHM - subretinal hyper-reflective material        Intravitreal Injection, Pharmacologic Agent - OD - Right Eye       Time Out 09/10/2019. 9:01 AM. Confirmed correct patient, procedure, site, and patient consented.   Anesthesia Topical anesthesia was used. Anesthetic medications included Lidocaine 2%, Proparacaine 0.5%.   Procedure Preparation included 5% betadine to ocular surface, eyelid speculum. A 30 gauge needle was used.   Injection:  1.25 mg Bevacizumab (AVASTIN) SOLN   NDC: 53299-242-68, Lot: [id number] , Expiration date: 11/16/2019   Route: Intravitreal, Site: Right Eye, Waste: 0 mL  Post-op Post injection exam found visual acuity of at least counting fingers. The patient tolerated the procedure well. There were no complications. The patient received written and verbal post procedure care education.                 ASSESSMENT/PLAN:    ICD-10-CM   1. Branch retinal vein occlusion of right eye with macular edema  H34.8310 Intravitreal Injection, Pharmacologic Agent - OD - Right Eye    Bevacizumab (AVASTIN) SOLN 1.25 mg  2. Retinal edema  H35.81 OCT,  Retina - OU - Both Eyes  3. Moderate nonproliferative diabetic retinopathy of both eyes without macular edema associated with type 2 diabetes  mellitus ([place])  K12.2449   4. Essential hypertension  I10   5. Hypertensive retinopathy of both eyes  H35.033   6. Pseudophakia of both eyes  Z96.1   7. Pseudophakia  Z96.1   8. Diabetes mellitus type 2 without retinopathy ([place])  E11.9   9. Combined forms of age-related cataract of left eye  H25.812     1,2. BRVO w/ CME OD - s/p IVA OD #1 (01.31.20), #2 (02.28.20), #3 (05.08.20), #4 (06.05.20), #5 (07.03.20), #6 (07.31.20), # 7(08.28.20), #8 (10.02.20), #9 (10.30.20) - delayed follow up to 10 weeks instead of four from 2.28 to 5.08 due to COVID-19 restrictions - FA (06.05.20) shows focal area of vascular non-perfusion and telangiectatic vessels superior macula -- consistent with BRVO - BCVA 20/30 OD -- stable from prior - OCT shows mild interval improvement in IRF/IRHM - recommend IVA OD #10today, 11.30.20 w/ f/u in 4 wks - RBA of procedure discussed, questions answered - informed consent obtained and signed - see procedure note - F/U 4 weeks -- DFE/OCT/possible injectoin  3. Mild to moderate nonproliferative diabetic retinopathy OU  - exam with rare, scattered IRH/MA OU  - FA (06.05.20) shows leaking microaneurysms OU; no NV OU  - OCT shows interval improvement in IRF OD (BRVO w/ CME as above)  - monitor  4,5. Hypertensive retinopathy OU  - discussed importance of tight BP control  - monitor  6. Pseudophakia OU  - s/p CE/IOL OU  - OD: done at [facility] around 2010; OS: Dr. Nancy Fetter (May 2020)  - beautiful surgeries, doing well  - monitor    Ophthalmic Meds Ordered this visit:  Meds ordered this encounter  Medications  . Bevacizumab (AVASTIN) SOLN 1.25 mg       Return in about 4 weeks (around 10/08/2019) for f/u BRVO OD, DFE, OCT.  There are no Patient Instructions on file for this visit.   Explained the diagnoses, plan, and follow up with the  patient and they expressed understanding.  Patient expressed understanding of the importance of proper follow up care.   This document serves as a record of services personally performed by Gardiner Sleeper, MD, PhD. It was created on their behalf by Leeann Must, [place], a certified ophthalmic assistant. The creation of this record is the provider's dictation and/or activities during the visit.    Electronically signed by: Leeann Must, COA @TODAY @ 12:36 PM   This document serves as a record of services personally performed by Gardiner Sleeper, MD, PhD. It was created on their behalf by Ernest Mallick, OA, an ophthalmic assistant. The creation of this record is the provider's dictation and/or activities during the visit.    Electronically signed by: Ernest Mallick, OA 11.30.2020 12:36 PM   Gardiner Sleeper, M.D., Ph.D. Diseases & Surgery of the Retina and Vitreous [facility]  I have reviewed the above documentation for accuracy and completeness, and I agree with the above. Gardiner Sleeper, M.D., Ph.D. 09/10/19 12:36 PM    Abbreviations: M myopia (nearsighted); A astigmatism; H hyperopia (farsighted); P presbyopia; Mrx spectacle prescription;  CTL contact lenses; OD right eye; OS left eye; OU both eyes  XT exotropia; ET esotropia; PEK punctate epithelial keratitis; PEE punctate epithelial erosions; DES dry eye syndrome; MGD meibomian gland dysfunction; ATs artificial tears; PFAT's preservative free artificial tears; [place] nuclear sclerotic  cataract; PSC posterior subcapsular cataract; ERM epi-retinal membrane; PVD posterior vitreous detachment; RD retinal detachment; DM diabetes mellitus; DR diabetic retinopathy; NPDR non-proliferative diabetic retinopathy; PDR proliferative diabetic retinopathy; CSME clinically significant macular edema; DME diabetic macular edema; dbh dot blot hemorrhages; CWS cotton wool spot; POAG primary open angle glaucoma; C/D cup-to-disc ratio; HVF  humphrey visual field; GVF goldmann visual field; OCT optical coherence tomography; IOP intraocular pressure; BRVO Branch retinal vein occlusion; CRVO central retinal vein occlusion; CRAO central retinal artery occlusion; BRAO branch retinal artery occlusion; RT retinal tear; SB scleral buckle; PPV pars plana vitrectomy; VH Vitreous hemorrhage; PRP panretinal laser photocoagulation; IVK intravitreal kenalog; VMT vitreomacular traction; MH Macular hole;  NVD neovascularization of the disc; NVE neovascularization elsewhere; AREDS age related eye disease study; ARMD age related macular degeneration; POAG primary open angle glaucoma; EBMD epithelial/anterior basement membrane dystrophy; ACIOL anterior chamber intraocular lens; IOL intraocular lens; PCIOL posterior chamber intraocular lens; Phaco/IOL phacoemulsification with intraocular lens placement; [place] photorefractive keratectomy; LASIK laser assisted in situ keratomileusis; HTN hypertension; DM diabetes mellitus; COPD chronic obstructive pulmonary disease

## 2019-09-07 IMAGING — CT CT CERVICAL SPINE W/O CM
4 of 7 series · 13 of 33 positions shown, 14 images · non-contrast
Comparison: None.

CLINICAL DATA: Fall at home.

EXAM:
CT HEAD WITHOUT CONTRAST
CT CERVICAL SPINE WITHOUT CONTRAST
TECHNIQUE: Multidetector CT imaging of the head and cervical spine was
performed following the standard protocol without intravenous
contrast. Multiplanar CT image reconstructions of the cervical spine
were also generated.

[Series 9: c_spine 2.0 st · axial · 0.35mm/px · z∈[-284,-152]mm · 4 of 112 slices shown, 5 images]
[im 23/112  soft-tissue]
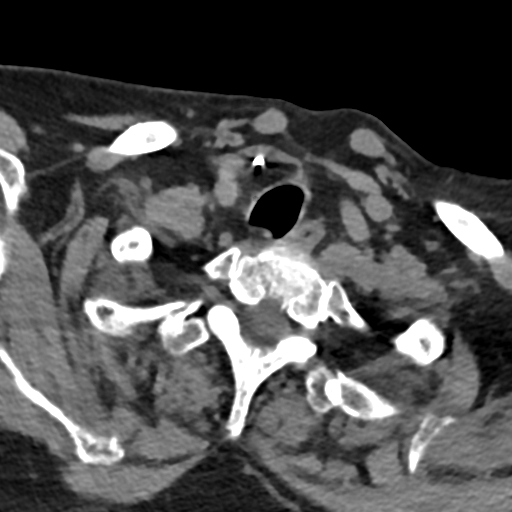
[im 23/112  bone]
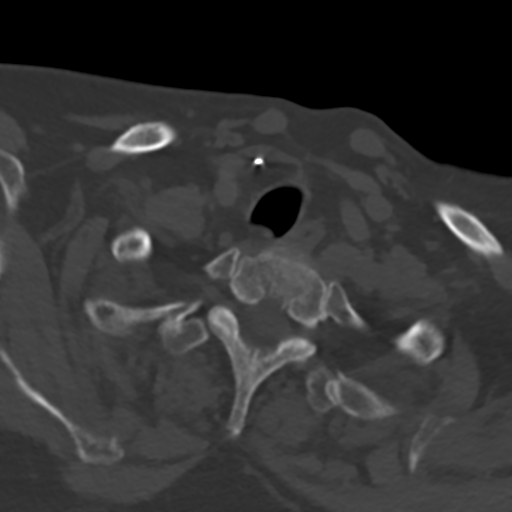
[im 45/112  bone]
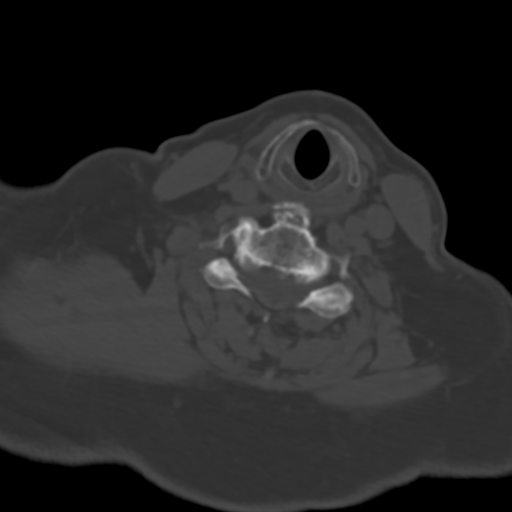
[im 67/112  bone]
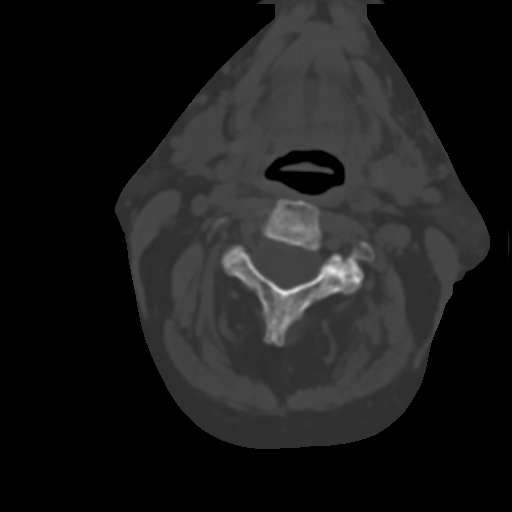
[im 89/112  bone]
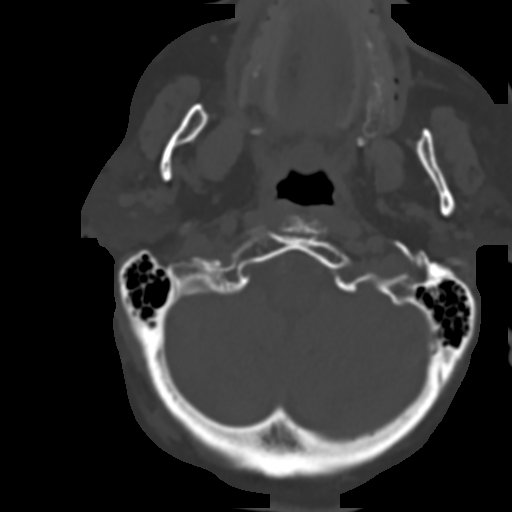

[Series 10: coronal bone · coronal · 0.23mm/px · 1 of 61 slices shown]
[im 31/61  bone]
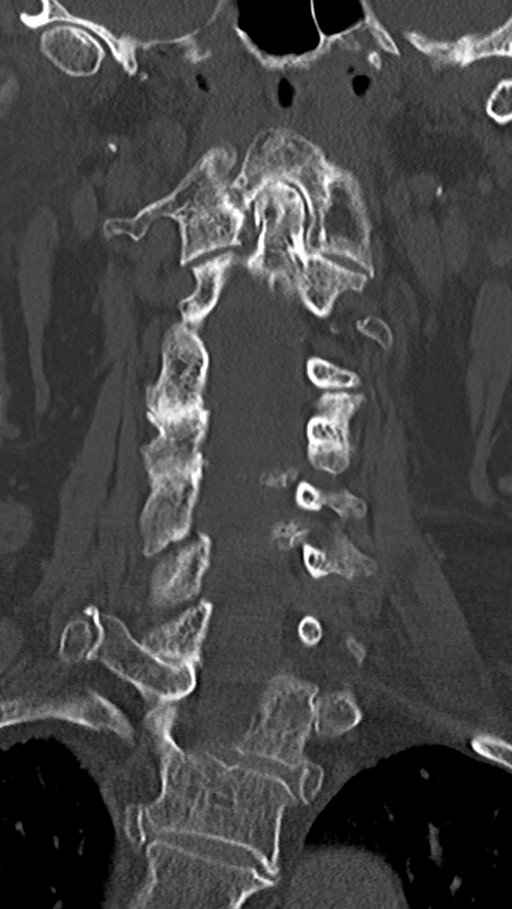

[Series 11: sagittal bone · sagittal · 0.33mm/px · 4 of 61 slices shown]
[im 13/61  bone]
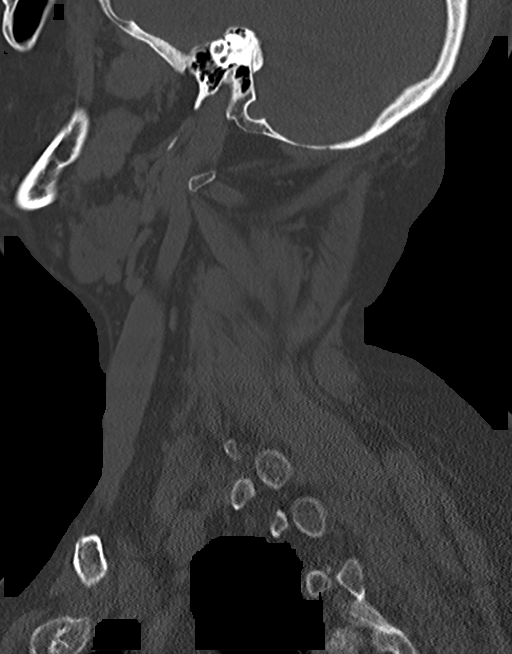
[im 25/61  bone]
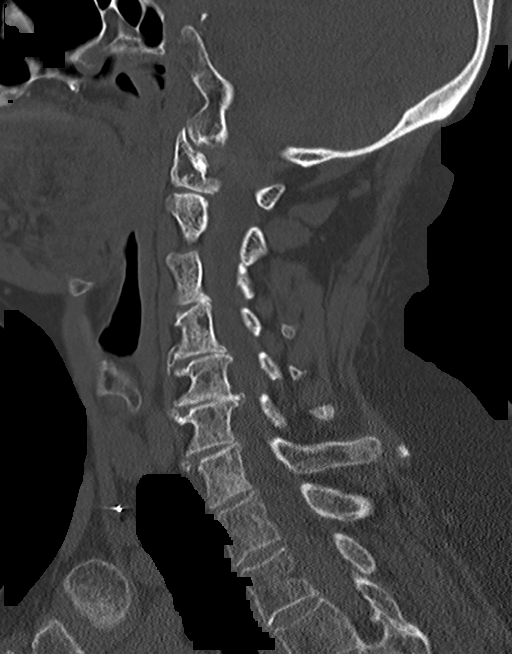
[im 37/61  bone]
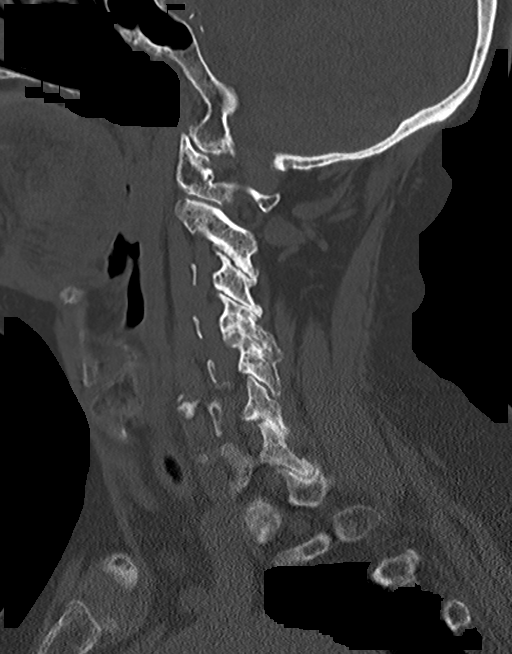
[im 49/61  bone]
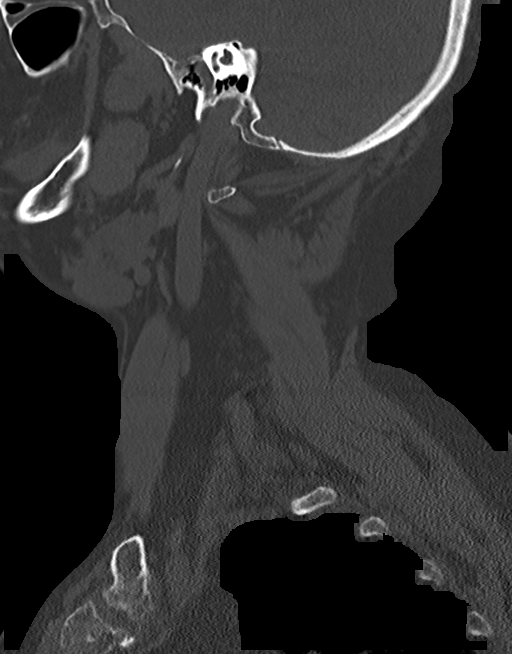

[Series 13: orthogonal axial st · axial · 0.21mm/px · z∈[-300,-187]mm · 4 of 98 slices shown]
[im 20/98  bone]
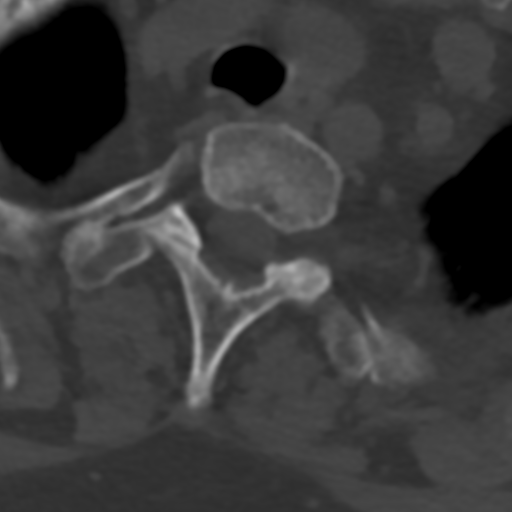
[im 39/98  bone]
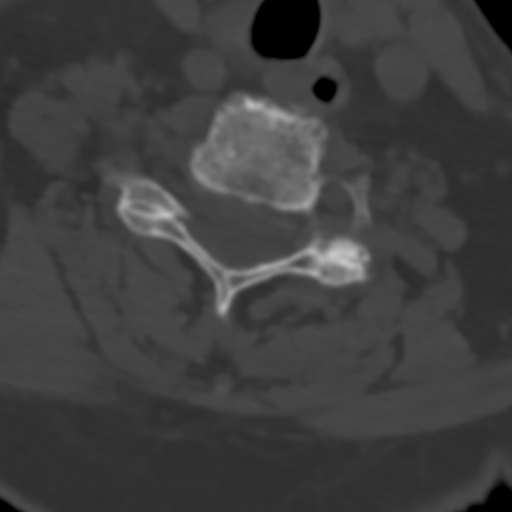
[im 59/98  bone]
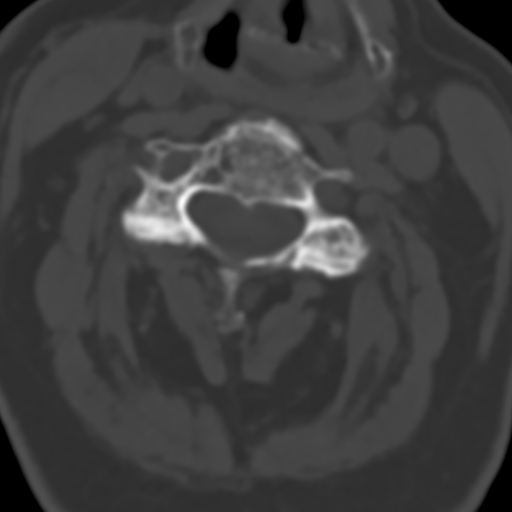
[im 78/98  bone]
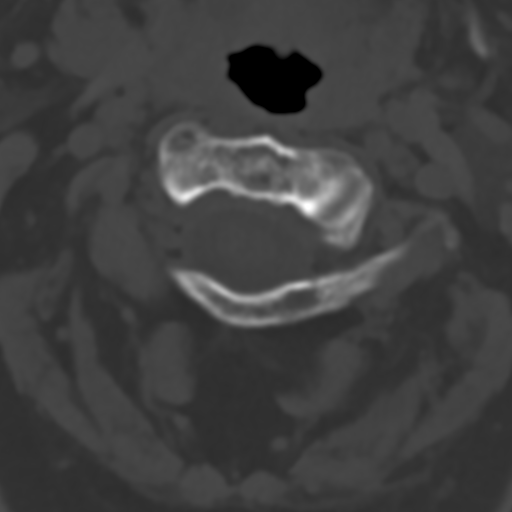

[13 of 33 positions shown; findings below may reference images not displayed]

FINDINGS: CT HEAD FINDINGS

Brain: Mild diffuse cortical atrophy is noted. Mild chronic ischemic
white matter disease is noted. No mass effect or midline shift is
noted. Ventricular size is within normal limits. There is no
evidence of mass lesion, hemorrhage or acute infarction.

Vascular: No hyperdense vessel or unexpected calcification.

Skull: Normal. Negative for fracture or focal lesion.

Sinuses/Orbits: No acute finding.

Other: Large right posterior scalp hematoma is noted.

CT CERVICAL SPINE FINDINGS

Alignment: Grade 1 anterolisthesis of C4-5 is noted secondary to
posterior facet joint hypertrophy.

Skull base and vertebrae: No acute fracture. No primary bone lesion
or focal pathologic process.

Soft tissues and spinal canal: No prevertebral fluid or swelling. No
visible canal hematoma.

Disc levels: Severe degenerative disc disease is noted at C4-5 and
C5-6 with anterior osteophyte formation.

Upper chest: Negative.

Other: Degenerative changes are seen involving the posterior facet
joints bilaterally.
IMPRESSION: Mild diffuse cortical atrophy. Mild chronic ischemic white matter
disease. Large right posterior scalp hematoma. No acute intracranial
abnormality seen.

Severe multilevel degenerative disc disease. No acute abnormality
seen in the cervical spine.

## 2019-09-08 IMAGING — RF DG C-ARM 61-120 MIN
1 series · 4 of 4 positions shown · non-contrast
Comparison: Right femur radiograph 08/19/2018

CLINICAL DATA: Patient status post ORIF proximal right femur
fracture.

EXAM:
RIGHT FEMUR 2 VIEWS; DG C-ARM 61-120 MIN

[Series 1: run · 4 of 4 slices shown]
[im 1/4]
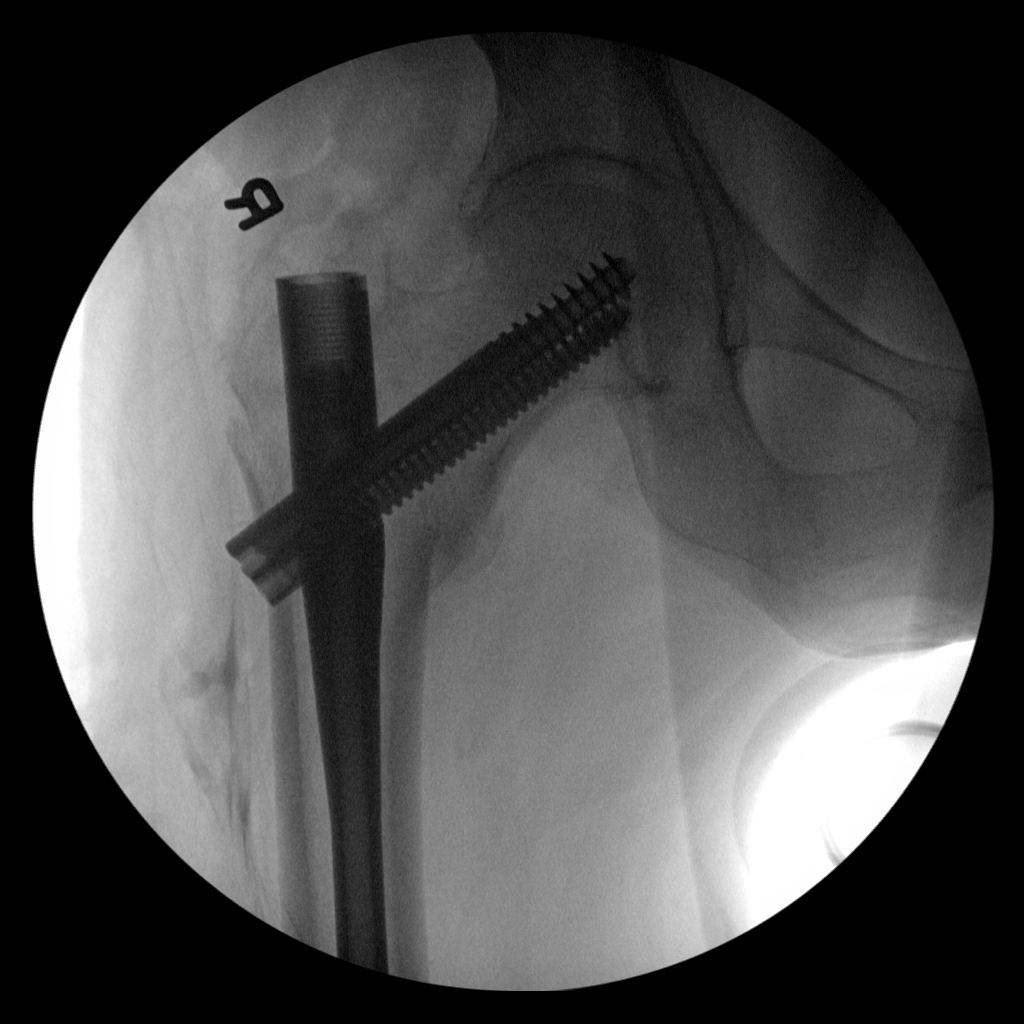
[im 2/4]
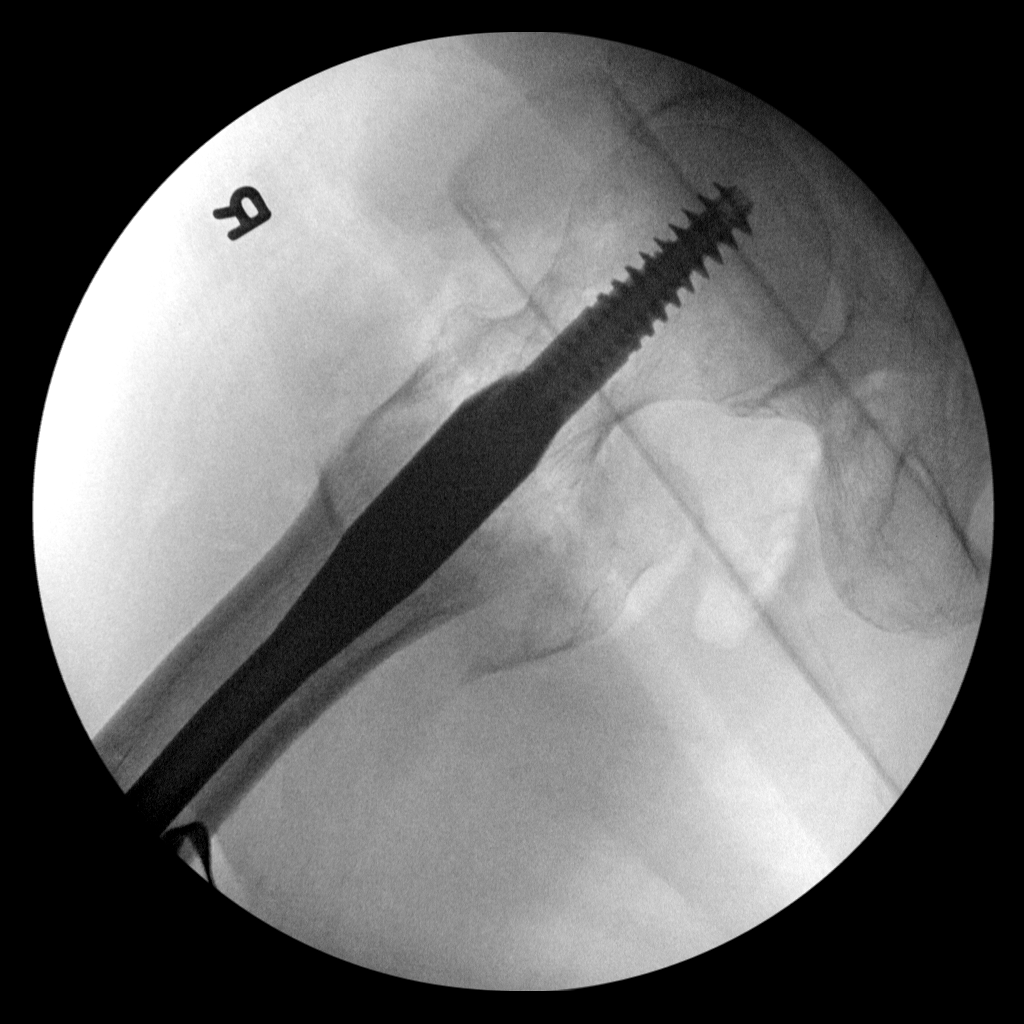
[im 3/4]
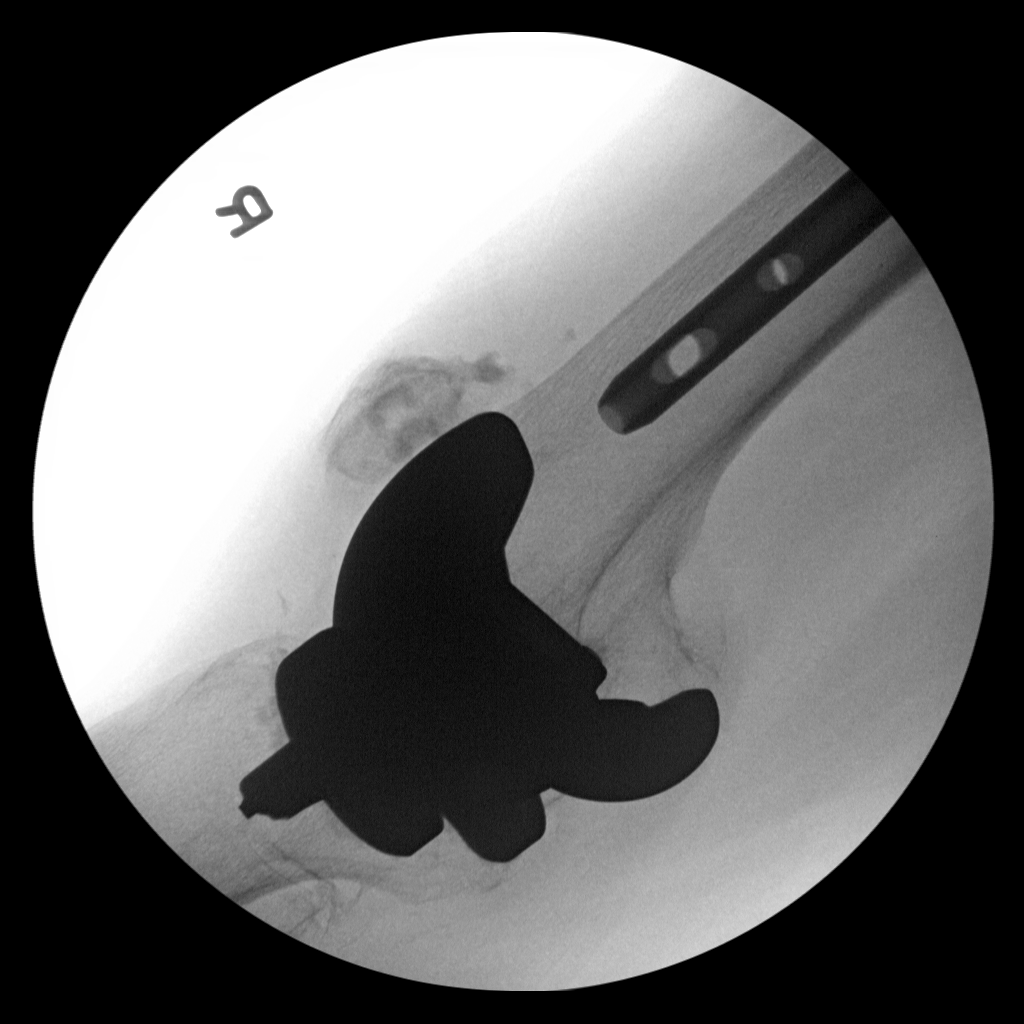
[im 4/4]
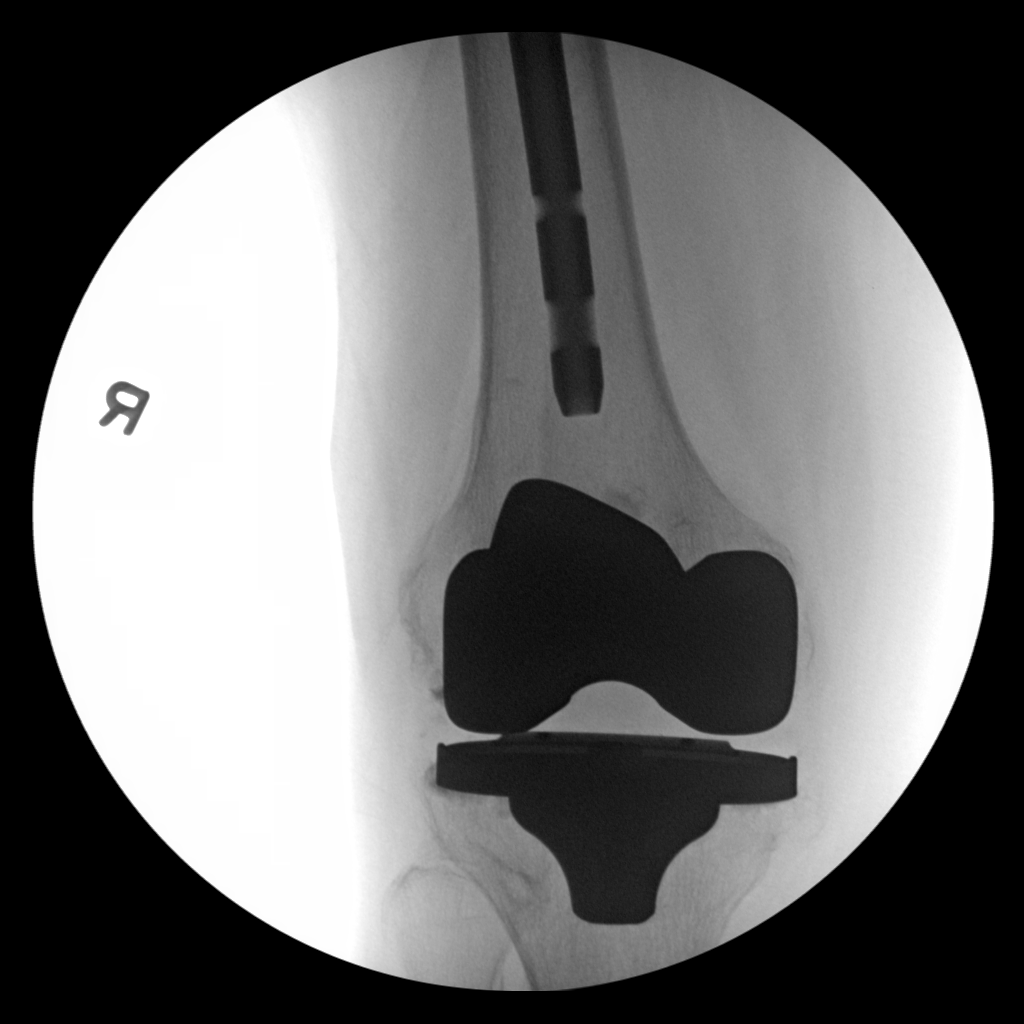

[4 of 4 positions shown; findings below may reference images not displayed]

FINDINGS: Patient status post intramedullary rod fixation of right
intertrochanteric femur fracture. Improved anatomic alignment.
Hardware appears in appropriate position.
IMPRESSION: Patient status post ORIF proximal right femur fracture.

## 2019-09-10 ENCOUNTER — Encounter (INDEPENDENT_AMBULATORY_CARE_PROVIDER_SITE_OTHER): Payer: Self-pay | Admitting: Ophthalmology

## 2019-09-10 ENCOUNTER — Other Ambulatory Visit: Payer: Self-pay

## 2019-09-10 ENCOUNTER — Ambulatory Visit (INDEPENDENT_AMBULATORY_CARE_PROVIDER_SITE_OTHER): Payer: Medicare Other | Admitting: Ophthalmology

## 2019-09-10 DIAGNOSIS — H34831 Tributary (branch) retinal vein occlusion, right eye, with macular edema: Secondary | ICD-10-CM

## 2019-09-10 DIAGNOSIS — E113393 Type 2 diabetes mellitus with moderate nonproliferative diabetic retinopathy without macular edema, bilateral: Secondary | ICD-10-CM | POA: Diagnosis not present

## 2019-09-10 DIAGNOSIS — H3581 Retinal edema: Secondary | ICD-10-CM | POA: Diagnosis not present

## 2019-09-10 DIAGNOSIS — I10 Essential (primary) hypertension: Secondary | ICD-10-CM

## 2019-09-10 DIAGNOSIS — Z961 Presence of intraocular lens: Secondary | ICD-10-CM

## 2019-09-10 DIAGNOSIS — H35033 Hypertensive retinopathy, bilateral: Secondary | ICD-10-CM

## 2019-09-10 DIAGNOSIS — E119 Type 2 diabetes mellitus without complications: Secondary | ICD-10-CM

## 2019-09-10 DIAGNOSIS — H25812 Combined forms of age-related cataract, left eye: Secondary | ICD-10-CM

## 2019-09-10 MED ORDER — BEVACIZUMAB CHEMO INJECTION 1.25MG/0.05ML SYRINGE FOR KALEIDOSCOPE
1.2500 mg | INTRAVITREAL | Status: AC | PRN
  Administered 2019-09-10: 1.25 mg via INTRAVITREAL

## 2019-10-14 ENCOUNTER — Encounter: Payer: Self-pay | Admitting: Family Medicine

## 2019-10-15 ENCOUNTER — Other Ambulatory Visit: Payer: Self-pay | Admitting: Family Medicine

## 2019-10-15 DIAGNOSIS — E118 Type 2 diabetes mellitus with unspecified complications: Secondary | ICD-10-CM

## 2019-10-15 DIAGNOSIS — B379 Candidiasis, unspecified: Secondary | ICD-10-CM

## 2019-10-15 DIAGNOSIS — Z1231 Encounter for screening mammogram for malignant neoplasm of breast: Secondary | ICD-10-CM

## 2019-10-15 MED ORDER — STEGLATRO 5 MG PO TABS: 5.0000 mg | ORAL_TABLET | Freq: Every day | ORAL | 4 refills | Status: DC

## 2019-10-15 MED ORDER — FARXIGA 5 MG PO TABS: 5.0000 mg | ORAL_TABLET | Freq: Every day | ORAL | 4 refills | Status: DC

## 2019-10-15 MED ORDER — FLUCONAZOLE 150 MG PO TABS: 150.0000 mg | ORAL_TABLET | Freq: Once | ORAL | 1 refills | Status: AC

## 2019-10-15 NOTE — Progress Notes (Signed)
can

## 2019-10-15 NOTE — Progress Notes (Addendum)
[facility] Note  10/18/2019     CHIEF COMPLAINT Patient presents for Retina Follow Up   HISTORY OF PRESENT ILLNESS: Meredith Shaw is a 74 y.o. female who presents to the clinic today for:   HPI    Retina Follow Up    Patient presents with  CRVO/BRVO.  In right eye.  This started weeks ago.  Severity is moderate.  Duration of weeks.  Since onset it is stable.  I, the attending physician,  performed the HPI with the patient and updated documentation appropriately.          Comments    Pt states her vision is the same OU.  Denies eye pain or discomfort and denies any new or worsening floaters or fol OU.       Last edited by Bernarda Caffey, MD on 10/18/2019 11:04 AM. (History)    pt states there is no change in her vision since her last visit  Referring physician: Baruch Gouty, [place],  [place] [id number]  HISTORICAL INFORMATION:   Selected notes from the MEDICAL RECORD NUMBER Referred by Dr. Maryjane Hurter for DM exam LEE: 01.16.20 (M. Le) [BCVA: OD: 20/40 OS: 20/50-3] Ocular Hx-PDR OU, macular edema OU, cataracts OU PMH-DM (takes metformin), arthritis, HTN, hypothyroidism    CURRENT MEDICATIONS: No current outpatient medications on file. (Ophthalmic Drugs)   No current facility-administered medications for this visit. (Ophthalmic Drugs)   Current Outpatient Medications (Other)  Medication Sig  . amLODipine (NORVASC) 5 MG tablet Take 1 tablet (5 mg total) by mouth daily.  . Blood Glucose Monitoring Suppl (ONETOUCH VERIO) w/Device KIT 1 each by Does not apply route daily.  . cholecalciferol (VITAMIN D) 1000 units tablet Take 5,000 Units by mouth daily.   . dapagliflozin propanediol (FARXIGA) 5 MG TABS tablet Take 5 mg by mouth daily before breakfast.  . denosumab (PROLIA) 60 MG/ML SOSY injection Inject 60 mg into the skin every 6 (six) months.  Marland Kitchen glucose blood (ONETOUCH VERIO) test strip Use as instructed  . Lancets (ONETOUCH  ULTRASOFT) lancets Use as instructed  . levothyroxine (SYNTHROID) 137 MCG tablet Take 1 tablet (137 mcg total) by mouth every other day. Before breakfast  . lisinopril (ZESTRIL) 20 MG tablet Take 1 tablet (20 mg total) by mouth daily.  . metFORMIN (GLUCOPHAGE) 500 MG tablet Take 1 tablet (500 mg total) by mouth 2 (two) times daily with a meal.  . metoprolol tartrate (LOPRESSOR) 100 MG tablet Take 1 tablet (100 mg total) by mouth 2 (two) times daily.  . naproxen sodium (ALEVE) 220 MG tablet Take 220 mg by mouth 2 (two) times daily as needed (for pain).  . pravastatin (PRAVACHOL) 40 MG tablet Take 1 tablet (40 mg total) by mouth at bedtime.   Current Facility-Administered Medications (Other)  Medication Route  . Bevacizumab (AVASTIN) SOLN 1.25 mg Intravitreal  . Bevacizumab (AVASTIN) SOLN 1.25 mg Intravitreal  . denosumab (PROLIA) injection 60 mg Subcutaneous      REVIEW OF SYSTEMS: ROS    Positive for: Genitourinary, Musculoskeletal, Endocrine, Eyes   Negative for: Constitutional, Gastrointestinal, Neurological, Skin, HENT, Cardiovascular, Respiratory, Psychiatric, Allergic/Imm, Heme/Lymph   Last edited by Doneen Poisson on 10/18/2019 10:00 AM. (History)       ALLERGIES Allergies  Allergen Reactions  . Penicillins Itching and Rash    PAST MEDICAL HISTORY Past Medical History:  Diagnosis Date  . Arthritis    OA AND PAIN BOTH KNEES -  RIGHT KNEE HURTS WORSE THAN LEFT  . Cataract   . Diabetes mellitus    ORAL MEDICATION - NO INSULIN  . Hypertension   . Hypertensive retinopathy    OU  . Hypothyroidism   . Kidney stones    NONE AT PRESENT TIME THAT PT IS AWARE OF  . Osteoporosis    Past Surgical History:  Procedure Laterality Date  . ABDOMINAL HYSTERECTOMY    . CATARACT EXTRACTION    . CHOLECYSTECTOMY  1972  . COLONOSCOPY    . EYE SURGERY     RIGHT CATARACT EXTRACTON WITH LENS IMPLANT  . GROWTH REMOVED FROM THYROID    . INTRAMEDULLARY (IM) NAIL INTERTROCHANTERIC Left  02/10/2018   Procedure: INTRAMEDULLARY (IM) NAIL INTERTROCHANTRIC;  Surgeon: Mcarthur Rossetti, MD;  Location: [facility];  Service: Orthopedics;  Laterality: Left;  . INTRAMEDULLARY (IM) NAIL INTERTROCHANTERIC Right 08/20/2018   Procedure: ORIF RIGHT HIP INTERTROC FRACTURE;  Surgeon: Mcarthur Rossetti, MD;  Location: [facility];  Service: Orthopedics;  Laterality: Right;  . left hip surgery   02/2018  . ORIF HIP FRACTURE Right 08/20/2018  . PERCUTANEOUS NEPHROLITHOTOMY    . TOTAL KNEE ARTHROPLASTY  06/09/2012   Procedure: TOTAL KNEE ARTHROPLASTY;  Surgeon: Mcarthur Rossetti, MD;  Location: [facility];  Service: Orthopedics;  Laterality: Right;  Right Total Knee Arthroplasty  . TOTAL KNEE ARTHROPLASTY Left 04/09/2014   Procedure: LEFT TOTAL KNEE ARTHROPLASTY;  Surgeon: Mcarthur Rossetti, MD;  Location: [facility];  Service: Orthopedics;  Laterality: Left;  . TUBAL LIGATION      FAMILY HISTORY Family History  Problem Relation Age of Onset  . Heart disease Mother   . Breast cancer Mother        75's  . Stroke Father   . Heart disease Father   . Healthy Daughter   . Healthy Son   . Liver disease Maternal Grandmother        gall stones imbedded in liver - blead to death when in surgery   . Pneumonia Maternal Grandfather   . Diabetes Paternal Grandmother     SOCIAL HISTORY Social History   Tobacco Use  . Smoking status: Never Smoker  . Smokeless tobacco: Never Used  Substance Use Topics  . Alcohol use: No  . Drug use: No         OPHTHALMIC EXAM:  Base Eye Exam    Visual Acuity (Snellen - Linear)      Right Left   Dist cc 20/30 20/20 -1   Dist ph cc NI    Correction: Glasses       Tonometry (Tonopen, 10:02 AM)      Right Left   Pressure 16 14       Pupils      Dark Light Shape React APD   Right 4 3 Round Brisk 0   Left 4 3 Round Brisk 0       Visual Fields      Left Right    Full Full       Extraocular Movement      Right Left    Full Full        Neuro/Psych    Oriented x3: Yes   Mood/Affect: Normal       Dilation    Both eyes: 1.0% Mydriacyl, 2.5% Phenylephrine @ 10:02 AM        Slit Lamp and Fundus Exam    Slit Lamp Exam      Right Left  Lids/Lashes Dermatochalasis - upper lid, Meibomian gland dysfunction Dermatochalasis - upper lid, Meibomian gland dysfunction   Conjunctiva/Sclera White and quiet White and quiet   Cornea 1+ Punctate epithelial erosions, Debris in tear film - improved, +ABMD 1+ Punctate epithelial erosions, Debris in tear film   Anterior Chamber deep and clear deep and clear, narrow temporal angle   Iris Round and dilated, No NVI Round and dilated, No NVI   Lens PC IOL in good position, trace Posterior capsular opacification PC IOL in good position with open PC   Vitreous mild Vitreous syneresis, Posterior vitreous detachment, silicone oil bubble mild Vitreous syneresis       Fundus Exam      Right Left   Disc compact, mild tilt, temporal PPA, mild pallor compact, Pink and Sharp, temporal Peripapillary atrophy   C/D Ratio 0.1 0.2   Macula blunted foveal reflex, edema improved, improving IRH and CWS superior macula Flat, good foveal reflex, Retinal pigment epithelial mottling, No edema, rare MA temporal macula   Vessels Vascular attenuation, Tortuous Vascular attenuation, Tortuous   Periphery Attached, scattered reticular degeneration, temporal IRH resolving Attached, reticular degeneration, mild, scattered MA/DBH, focal pigment clumping temporal periphery        Refraction    Wearing Rx      Sphere Cylinder Axis Add   Right [place] +0.50 020 +2.50   Left +0.25 +0.25 130 +2.50   Type: prog          IMAGING AND PROCEDURES  Imaging and Procedures for _0 @  OCT, Retina - OU - Both Eyes       Right Eye Quality was good. Central Foveal Thickness: 288. Progression has improved. Findings include abnormal foveal contour, no SRF, intraretinal fluid, cystoid macular edema (Mild interval improvement  in IRF/IRHM).   Left Eye Quality was good. Central Foveal Thickness: 279. Progression has been stable. Findings include normal foveal contour, no IRF, no SRF, vitreomacular adhesion .   Notes *Images captured and stored on drive  Diagnosis / Impression:  OD: superior BRVO w/ CME -- interval improvement in IRF/IRHM OS: NFP; no IRF/SRF--stable  Clinical management:  See below  Abbreviations: NFP - Normal foveal profile. CME - cystoid macular edema. PED - pigment epithelial detachment. IRF - intraretinal fluid. SRF - subretinal fluid. EZ - ellipsoid zone. ERM - epiretinal membrane. ORA - outer retinal atrophy. ORT - outer retinal tubulation. SRHM - subretinal hyper-reflective material        Intravitreal Injection, Pharmacologic Agent - OD - Right Eye       Time Out 10/18/2019. 11:24 AM. Confirmed correct patient, procedure, site, and patient consented.   Anesthesia Topical anesthesia was used. Anesthetic medications included Lidocaine 2%, Proparacaine 0.5%.   Procedure Preparation included 5% betadine to ocular surface, eyelid speculum. A 30 gauge needle was used.   Injection:  1.25 mg Bevacizumab (AVASTIN) SOLN   NDC: 37169-678-93, Lot: [id number] , Expiration date: 12/11/2019   Route: Intravitreal, Site: Right Eye, Waste: 0 mL  Post-op Post injection exam found visual acuity of at least counting fingers. The patient tolerated the procedure well. There were no complications. The patient received written and verbal post procedure care education.                 ASSESSMENT/PLAN:    ICD-10-CM   1. Branch retinal vein occlusion of right eye with macular edema  H34.8310 Intravitreal Injection, Pharmacologic Agent - OD - Right Eye    Bevacizumab (AVASTIN) SOLN 1.25 mg  2. Retinal edema  H35.81 OCT, Retina - OU - Both Eyes  3. 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  Essential hypertension  I10   5. Hypertensive retinopathy of both eyes  H35.033   6. Pseudophakia of both eyes  Z96.1     1,2. BRVO w/ CME OD - s/p IVA OD #1 (01.31.20), #2 (02.28.20), #3 (05.08.20), #4 (06.05.20), #5 (07.03.20), #6 (07.31.20), # 7(08.28.20), #8 (10.02.20), #9 (10.30.20), #10 (11.30.20) - FA (06.05.20) shows focal area of vascular non-perfusion and telangiectatic vessels superior macula -- consistent with BRVO - BCVA 20/30 OD -- stable from prior - OCT shows mild interval improvement in IRF/IRHM  - discussed possible resistance to Avastin, very slow progress, and possibility of switching medication - recommend IVA OD #11today, 01.07.20 w/ f/u in 4 wks - RBA of procedure discussed, questions answered - informed consent obtained and signed - see procedure note  - Eylea[employer] benefits investigation started 01.07.21 - F/U 4 weeks -- DFE/OCT/possible injectoin  3. Mild to moderate nonproliferative diabetic retinopathy OU  - exam with rare, scattered IRH/MA OU  - FA (06.05.20) shows leaking microaneurysms OU; no NV OU  - OCT shows interval improvement in IRF OD (BRVO w/ CME as above)  - monitor  4,5. Hypertensive retinopathy OU  - discussed importance of tight BP control  - monitor  6. Pseudophakia OU  - s/p CE/IOL OU  - OD: done at [facility] around 2010; OS: Dr. Nancy Fetter (May 2020)  - beautiful surgeries, doing well  - monitor  Ophthalmic Meds Ordered this visit:  Meds ordered this encounter  Medications  . Bevacizumab (AVASTIN) SOLN 1.25 mg      Return in about 4 weeks (around 11/15/2019) for f/u BRVO OD, DFE, OCT.  There are no Patient Instructions on file for this visit.   Explained the diagnoses, plan, and follow up with the patient and they expressed understanding.  Patient expressed understanding of the importance of proper follow up care.   This  document serves as a record of services personally performed by Gardiner Sleeper, MD, PhD. It was created on their behalf by Leeann Must, [place], a certified ophthalmic assistant. The creation of this record is the provider's dictation and/or activities during the visit.    Electronically signed by: Leeann Must, COA _0 @ 9:56 PM   This document serves as a record of services personally performed by Gardiner Sleeper, MD, PhD. It was created on their behalf by Ernest Mallick, OA, an ophthalmic assistant. The creation of this record is the provider's dictation and/or activities during the visit.    Electronically signed by: Ernest Mallick, OA 01.07.2021 9:56 PM   Gardiner Sleeper, M.D., Ph.D. Diseases & Surgery of the Retina and Vitreous [facility]  I have reviewed the above documentation for accuracy and completeness, and I agree with the above. Gardiner Sleeper, M.D., Ph.D. 10/18/19 9:56 PM   Abbreviations: M myopia (nearsighted); A astigmatism; H hyperopia (farsighted); P presbyopia; Mrx spectacle prescription;  CTL contact lenses; OD right eye; OS left eye; OU both eyes  XT exotropia; ET esotropia; PEK punctate epithelial keratitis; PEE punctate epithelial erosions; DES dry eye syndrome; MGD meibomian gland dysfunction; ATs artificial tears; PFAT's preservative free artificial tears; [place] nuclear sclerotic cataract; PSC posterior subcapsular cataract; ERM epi-retinal membrane; PVD posterior vitreous detachment; RD retinal detachment; DM diabetes mellitus; DR diabetic retinopathy; NPDR non-proliferative diabetic retinopathy; PDR proliferative  diabetic retinopathy; CSME clinically significant macular edema; DME diabetic macular edema; dbh dot blot hemorrhages; CWS cotton wool spot; POAG primary open angle glaucoma; C/D cup-to-disc ratio; HVF humphrey visual field; GVF goldmann visual field; OCT optical coherence tomography; IOP intraocular pressure; BRVO Branch retinal vein  occlusion; CRVO central retinal vein occlusion; CRAO central retinal artery occlusion; BRAO branch retinal artery occlusion; RT retinal tear; SB scleral buckle; PPV pars plana vitrectomy; VH Vitreous hemorrhage; PRP panretinal laser photocoagulation; IVK intravitreal kenalog; VMT vitreomacular traction; MH Macular hole;  NVD neovascularization of the disc; NVE neovascularization elsewhere; AREDS age related eye disease study; ARMD age related macular degeneration; POAG primary open angle glaucoma; EBMD epithelial/anterior basement membrane dystrophy; ACIOL anterior chamber intraocular lens; IOL intraocular lens; PCIOL posterior chamber intraocular lens; Phaco/IOL phacoemulsification with intraocular lens placement; [place] photorefractive keratectomy; LASIK laser assisted in situ keratomileusis; HTN hypertension; DM diabetes mellitus; COPD chronic obstructive pulmonary disease

## 2019-10-16 ENCOUNTER — Encounter (INDEPENDENT_AMBULATORY_CARE_PROVIDER_SITE_OTHER): Payer: Medicare Other | Admitting: Ophthalmology

## 2019-10-18 ENCOUNTER — Encounter (INDEPENDENT_AMBULATORY_CARE_PROVIDER_SITE_OTHER): Payer: Self-pay | Admitting: Ophthalmology

## 2019-10-18 ENCOUNTER — Ambulatory Visit (INDEPENDENT_AMBULATORY_CARE_PROVIDER_SITE_OTHER): Payer: Medicare Other | Admitting: Ophthalmology

## 2019-10-18 DIAGNOSIS — H34831 Tributary (branch) retinal vein occlusion, right eye, with macular edema: Secondary | ICD-10-CM

## 2019-10-18 DIAGNOSIS — H35033 Hypertensive retinopathy, bilateral: Secondary | ICD-10-CM

## 2019-10-18 DIAGNOSIS — H3581 Retinal edema: Secondary | ICD-10-CM

## 2019-10-18 DIAGNOSIS — Z961 Presence of intraocular lens: Secondary | ICD-10-CM

## 2019-10-18 DIAGNOSIS — I10 Essential (primary) hypertension: Secondary | ICD-10-CM

## 2019-10-18 DIAGNOSIS — E113393 Type 2 diabetes mellitus with moderate nonproliferative diabetic retinopathy without macular edema, bilateral: Secondary | ICD-10-CM | POA: Diagnosis not present

## 2019-10-18 MED ORDER — BEVACIZUMAB CHEMO INJECTION 1.25MG/0.05ML SYRINGE FOR KALEIDOSCOPE
1.2500 mg | INTRAVITREAL | Status: AC | PRN
  Administered 2019-10-18: 1.25 mg via INTRAVITREAL

## 2019-11-02 ENCOUNTER — Ambulatory Visit: Payer: Medicare Other | Attending: Internal Medicine

## 2019-11-02 DIAGNOSIS — Z23 Encounter for immunization: Secondary | ICD-10-CM

## 2019-11-02 NOTE — Progress Notes (Signed)
   Covid-19 Vaccination Clinic  Name:  Meredith Shaw    MRN: [id number] DOB: Nov 01, 1945  11/02/2019  Meredith Shaw was observed post Covid-19 immunization for 15 minutes without incidence. She was provided with Vaccine Information Sheet and instruction to access the V-Safe system.   Meredith Shaw was instructed to call 911 with any severe reactions post vaccine: Marland Kitchen Difficulty breathing  . Swelling of your face and throat  . A fast heartbeat  . A bad rash all over your body  . Dizziness and weakness    Immunizations Administered    Name Date Dose VIS Date Route   Pfizer COVID-19 Vaccine 11/02/2019 10:37 AM 0.3 mL 09/21/2019 Intramuscular   Manufacturer: [employer], [employer]   Lot: [id number]   NDC: [address]-3

## 2019-11-05 ENCOUNTER — Ambulatory Visit
Admission: RE | Admit: 2019-11-05 | Discharge: 2019-11-05 | Disposition: A | Discharge status: Home / Self Care | Payer: Medicare Other | Source: Ambulatory Visit | Attending: Family Medicine | Admitting: Family Medicine

## 2019-11-05 DIAGNOSIS — Z1231 Encounter for screening mammogram for malignant neoplasm of breast: Secondary | ICD-10-CM | POA: Diagnosis present

## 2019-11-13 NOTE — Progress Notes (Addendum)
[facility] Note  11/16/2019     CHIEF COMPLAINT Patient presents for Retina Follow Up   HISTORY OF PRESENT ILLNESS: Meredith Shaw is a 74 y.o. female who presents to the clinic today for:   HPI    Retina Follow Up    Patient presents with  CRVO/BRVO.  In right eye.  This started months ago.  Severity is moderate.  Duration of 4 weeks.  Since onset it is stable.  I, the attending physician,  performed the HPI with the patient and updated documentation appropriately.          Comments    74 y/o female pt here for 4 wk f/u for BRVO w/mac edema OD.  No change in [place] OU.  Denies pain, flashes, but has been seeing an occasional small black floater OD.  No gtts.       Last edited by Bernarda Caffey, MD on 11/16/2019 12:40 PM. (History)    pt states there is no change in her vision since her last visit. Occasional small floater OD.  Referring physician: Baruch Gouty, [address] [place],  [place] [address]  HISTORICAL INFORMATION:   Selected notes from the MEDICAL RECORD NUMBER Referred by Dr. Maryjane Hurter for DM exam LEE: 01.16.20 (M. Le) [BCVA: OD: 20/40 OS: 20/50-3] Ocular Hx-PDR OU, macular edema OU, cataracts OU PMH-DM (takes metformin), arthritis, HTN, hypothyroidism    CURRENT MEDICATIONS: No current outpatient medications on file. (Ophthalmic Drugs)   No current facility-administered medications for this visit. (Ophthalmic Drugs)   Current Outpatient Medications (Other)  Medication Sig  . amLODipine (NORVASC) 5 MG tablet Take 1 tablet (5 mg total) by mouth daily.  . Blood Glucose Monitoring Suppl (ONETOUCH VERIO) w/Device KIT 1 each by Does not apply route daily.  . canagliflozin (INVOKANA) 100 MG TABS tablet Take 1 tablet (100 mg total) by mouth daily before breakfast.  . cholecalciferol (VITAMIN D) 1000 units tablet Take 5,000 Units by mouth daily.   Marland Kitchen denosumab (PROLIA) 60 MG/ML SOSY injection Inject 60 mg into the skin every 6 (six)  months.  Marland Kitchen glucose blood (ONETOUCH VERIO) test strip Use as instructed  . Lancets (ONETOUCH ULTRASOFT) lancets Use as instructed  . levothyroxine (SYNTHROID) 137 MCG tablet Take 1 tablet (137 mcg total) by mouth every other day. Before breakfast  . lisinopril (ZESTRIL) 20 MG tablet Take 1 tablet (20 mg total) by mouth daily.  . metFORMIN (GLUCOPHAGE) 500 MG tablet Take 1 tablet (500 mg total) by mouth 2 (two) times daily with a meal.  . metoprolol tartrate (LOPRESSOR) 100 MG tablet Take 1 tablet (100 mg total) by mouth 2 (two) times daily.  . naproxen sodium (ALEVE) 220 MG tablet Take 220 mg by mouth 2 (two) times daily as needed (for pain).  . pravastatin (PRAVACHOL) 40 MG tablet Take 1 tablet (40 mg total) by mouth at bedtime.   Current Facility-Administered Medications (Other)  Medication Route  . Bevacizumab (AVASTIN) SOLN 1.25 mg Intravitreal  . denosumab (PROLIA) injection 60 mg Subcutaneous      REVIEW OF SYSTEMS: ROS    Positive for: Genitourinary, Musculoskeletal, Endocrine, Eyes   Negative for: Constitutional, Gastrointestinal, Neurological, Skin, HENT, Cardiovascular, Respiratory, Psychiatric, Allergic/Imm, Heme/Lymph   Last edited by Matthew Folks, COA on 11/16/2019  9:43 AM. (History)       ALLERGIES Allergies  Allergen Reactions  . Penicillins Itching and Rash    PAST MEDICAL HISTORY Past Medical History:  Diagnosis Date  . Arthritis    OA AND PAIN BOTH KNEES -RIGHT KNEE HURTS WORSE THAN LEFT  . Cataract   . Diabetes mellitus    ORAL MEDICATION - NO INSULIN  . Hypertension   . Hypertensive retinopathy    OU  . Hypothyroidism   . Kidney stones    NONE AT PRESENT TIME THAT PT IS AWARE OF  . Osteoporosis    Past Surgical History:  Procedure Laterality Date  . ABDOMINAL HYSTERECTOMY    . CATARACT EXTRACTION    . CHOLECYSTECTOMY  1972  . COLONOSCOPY    . EYE SURGERY     RIGHT CATARACT EXTRACTON WITH LENS IMPLANT  . GROWTH REMOVED FROM THYROID    .  INTRAMEDULLARY (IM) NAIL INTERTROCHANTERIC Left 02/10/2018   Procedure: INTRAMEDULLARY (IM) NAIL INTERTROCHANTRIC;  Surgeon: Mcarthur Rossetti, MD;  Location: [facility];  Service: Orthopedics;  Laterality: Left;  . INTRAMEDULLARY (IM) NAIL INTERTROCHANTERIC Right 08/20/2018   Procedure: ORIF RIGHT HIP INTERTROC FRACTURE;  Surgeon: Mcarthur Rossetti, MD;  Location: [facility];  Service: Orthopedics;  Laterality: Right;  . left hip surgery   02/2018  . ORIF HIP FRACTURE Right 08/20/2018  . PERCUTANEOUS NEPHROLITHOTOMY    . TOTAL KNEE ARTHROPLASTY  06/09/2012   Procedure: TOTAL KNEE ARTHROPLASTY;  Surgeon: Mcarthur Rossetti, MD;  Location: [facility];  Service: Orthopedics;  Laterality: Right;  Right Total Knee Arthroplasty  . TOTAL KNEE ARTHROPLASTY Left 04/09/2014   Procedure: LEFT TOTAL KNEE ARTHROPLASTY;  Surgeon: Mcarthur Rossetti, MD;  Location: [facility];  Service: Orthopedics;  Laterality: Left;  . TUBAL LIGATION      FAMILY HISTORY Family History  Problem Relation Age of Onset  . Heart disease Mother   . Breast cancer Mother        40's  . Stroke Father   . Heart disease Father   . Healthy Daughter   . Healthy Son   . Liver disease Maternal Grandmother        gall stones imbedded in liver - blead to death when in surgery   . Pneumonia Maternal Grandfather   . Diabetes Paternal Grandmother     SOCIAL HISTORY Social History   Tobacco Use  . Smoking status: Never Smoker  . Smokeless tobacco: Never Used  Substance Use Topics  . Alcohol use: No  . Drug use: No         OPHTHALMIC EXAM:  Base Eye Exam    Visual Acuity (Snellen - Linear)      Right Left   Dist cc 20/25 -2 20/20 -2   Dist ph cc NI    Correction: Glasses       Tonometry (Tonopen, 9:45 AM)      Right Left   Pressure 16 14       Pupils      Dark Light Shape React APD   Right 4 3 Round Brisk None   Left 4 3 Round Brisk None       Visual Fields (Counting fingers)      Left Right    Full  Full       Extraocular Movement      Right Left    Full, Ortho Full, Ortho       Neuro/Psych    Oriented x3: Yes   Mood/Affect: Normal       Dilation    Both eyes: 1.0% Mydriacyl, 2.5% Phenylephrine @ 9:45 AM        Slit Lamp  and Fundus Exam    Slit Lamp Exam      Right Left   Lids/Lashes Dermatochalasis - upper lid, Meibomian gland dysfunction Dermatochalasis - upper lid, Meibomian gland dysfunction   Conjunctiva/Sclera White and quiet White and quiet   Cornea 1+ Punctate epithelial erosions, Debris in tear film - improved, +ABMD 1+ Punctate epithelial erosions, Debris in tear film   Anterior Chamber deep and clear deep and clear, narrow temporal angle   Iris Round and dilated, No NVI Round and dilated, No NVI   Lens PC IOL in good position, trace Posterior capsular opacification PC IOL in good position with open PC   Vitreous mild Vitreous syneresis, Posterior vitreous detachment, silicone oil bubble mild Vitreous syneresis       Fundus Exam      Right Left   Disc compact, mild tilt, temporal PPA, mild pallor compact, Pink and Sharp, temporal Peripapillary atrophy   C/D Ratio 0.1 0.2   Macula blunted foveal reflex, edema improved, improving IRH and CWS superior macula good foveal reflex, Retinal pigment epithelial mottling, No edema, rare MA temporal macula   Vessels Vascular attenuation, Tortuous Vascular attenuation, Tortuous   Periphery Attached, scattered reticular degeneration, temporal IRH resolving Attached, reticular degeneration, mild, scattered MA/DBH, focal pigment clumping temporal periphery          IMAGING AND PROCEDURES  Imaging and Procedures for _0 @  OCT, Retina - OU - Both Eyes       Right Eye Quality was good. Central Foveal Thickness: 278. Progression has improved. Findings include abnormal foveal contour, no SRF, intraretinal fluid, cystoid macular edema (Mild interval improvement in IRF/IRHM, superior macula).   Left Eye Quality was  good. Central Foveal Thickness: 278. Progression has been stable. Findings include normal foveal contour, no IRF, no SRF, vitreomacular adhesion .   Notes *Images captured and stored on drive  Diagnosis / Impression:  OD: superior BRVO w/ CME -- mild interval improvement in IRF/IRHM, superior macula OS: NFP; no IRF/SRF--stable  Clinical management:  See below  Abbreviations: NFP - Normal foveal profile. CME - cystoid macular edema. PED - pigment epithelial detachment. IRF - intraretinal fluid. SRF - subretinal fluid. EZ - ellipsoid zone. ERM - epiretinal membrane. ORA - outer retinal atrophy. ORT - outer retinal tubulation. SRHM - subretinal hyper-reflective material        Intravitreal Injection, Pharmacologic Agent - OD - Right Eye       Time Out 11/16/2019. 10:24 AM. Confirmed correct patient, procedure, site, and patient consented.   Anesthesia Topical anesthesia was used. Anesthetic medications included Lidocaine 2%, Proparacaine 0.5%.   Procedure Preparation included 5% betadine to ocular surface, eyelid speculum. A supplied needle was used.   Injection:  1.25 mg Bevacizumab (AVASTIN) SOLN   NDC: 00938-182-99, Lot: [id number] , Expiration date: 12/19/2019   Route: Intravitreal, Site: Right Eye, Waste: 0 mL  Post-op Post injection exam found visual acuity of at least counting fingers. The patient tolerated the procedure well. There were no complications. The patient received written and verbal post procedure care education.                 ASSESSMENT/PLAN:    ICD-10-CM   1. Branch retinal vein occlusion of right eye with macular edema  H34.8310 Intravitreal Injection, Pharmacologic Agent - OD - Right Eye    Bevacizumab (AVASTIN) SOLN 1.25 mg  2. Retinal edema  H35.81 OCT, Retina - OU - Both Eyes  3. 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 Essential  hypertension  I10   5. Hypertensive retinopathy of both eyes  H35.033   6. Pseudophakia of both eyes  Z96.1     1,2. BRVO w/ CME OD - s/p IVA OD #1 (01.31.20), #2 (02.28.20), #3 (05.08.20), #4 (06.05.20), #5 (07.03.20), #6 (07.31.20), # 7(08.28.20), #8 (10.02.20), #9 (10.30.20), #10 (11.30.20), #11 (01.07.21) - FA (06.05.20) shows focal area of vascular non-perfusion and telangiectatic vessels superior macula -- consistent with BRVO - BCVA 20/25-5 OD -- improved from prior - OCT shows mild interval improvement in IRF/IRHM, superior macula  - discussed possible resistance to Avastin, very slow progress, and possibility of switching medication - recommend IVA OD #12today, 02.02.21 w/ f/u in 4 wks - RBA of procedure discussed, questions answered - see procedure note  - [employer] benefits investigation started 01.07.21 -- still pending for 2021 -- [employer] Days approved, but now awaiting [facility] approval  - informed consent for IVA OD signed and scanned on 01.08.21 - F/U 4 weeks -- DFE/OCT/possible injectoin  3. Mild to moderate nonproliferative diabetic retinopathy OU  - exam with rare, scattered IRH/MA OU  - FA (06.05.20) shows leaking microaneurysms OU; no NV OU  - OCT shows interval improvement in IRF OD (BRVO w/ CME as above)  - monitor  4,5. Hypertensive retinopathy OU  - discussed importance of tight BP control  - monitor  6. Pseudophakia OU  - s/p CE/IOL OU  - OD: done at [facility] around 2010; OS: Dr. Nancy Fetter (May 2020)  - beautiful surgeries, doing well  - monitor  Ophthalmic Meds Ordered this visit:  Meds ordered this encounter  Medications  . Bevacizumab (AVASTIN) SOLN 1.25 mg      Return in about 4 weeks (around 12/14/2019) for DFE, OCT.  There are no Patient Instructions on file for this visit.   Explained the diagnoses, plan, and follow up with the patient and they expressed  understanding.  Patient expressed understanding of the importance of proper follow up care.   This document serves as a record of services personally performed by Gardiner Sleeper, MD, PhD. It was created on their behalf by Ernest Mallick, OA, an ophthalmic assistant. The creation of this record is the provider's dictation and/or activities during the visit.    Electronically signed by: Ernest Mallick, OA 02.02.2021 12:43 PM   Gardiner Sleeper, M.D., Ph.D. Diseases & Surgery of the Retina and Vitreous [facility]  I have reviewed the above documentation for accuracy and completeness, and I agree with the above. Gardiner Sleeper, M.D., Ph.D. 11/16/19 12:43 PM   Abbreviations: M myopia (nearsighted); A astigmatism; H hyperopia (farsighted); P presbyopia; Mrx spectacle prescription;  CTL contact lenses; OD right eye; OS left eye; OU both eyes  XT exotropia; ET esotropia; PEK punctate epithelial keratitis; PEE punctate epithelial erosions; DES dry eye syndrome; MGD meibomian gland dysfunction; ATs artificial tears; PFAT's preservative free artificial tears; [place] nuclear sclerotic cataract; PSC posterior subcapsular cataract; ERM epi-retinal membrane; PVD posterior vitreous detachment; RD retinal detachment; DM diabetes mellitus; DR diabetic retinopathy; NPDR non-proliferative diabetic retinopathy; PDR proliferative diabetic retinopathy; CSME clinically significant macular edema; DME diabetic macular edema; dbh dot blot hemorrhages; CWS cotton wool spot; POAG primary open angle glaucoma; C/D cup-to-disc ratio; HVF humphrey visual field; GVF goldmann visual field; OCT optical coherence tomography; IOP intraocular pressure; BRVO Branch retinal vein occlusion; CRVO central retinal vein occlusion; CRAO central retinal artery  occlusion; BRAO branch retinal artery occlusion; RT retinal tear; SB scleral buckle; PPV pars plana vitrectomy; VH Vitreous hemorrhage; PRP panretinal laser  photocoagulation; IVK intravitreal kenalog; VMT vitreomacular traction; MH Macular hole;  NVD neovascularization of the disc; NVE neovascularization elsewhere; AREDS age related eye disease study; ARMD age related macular degeneration; POAG primary open angle glaucoma; EBMD epithelial/anterior basement membrane dystrophy; ACIOL anterior chamber intraocular lens; IOL intraocular lens; PCIOL posterior chamber intraocular lens; Phaco/IOL phacoemulsification with intraocular lens placement; [place] photorefractive keratectomy; LASIK laser assisted in situ keratomileusis; HTN hypertension; DM diabetes mellitus; COPD chronic obstructive pulmonary disease

## 2019-11-14 ENCOUNTER — Other Ambulatory Visit: Payer: Self-pay

## 2019-11-15 ENCOUNTER — Ambulatory Visit (INDEPENDENT_AMBULATORY_CARE_PROVIDER_SITE_OTHER): Payer: Medicare Other | Admitting: Family Medicine

## 2019-11-15 ENCOUNTER — Encounter: Payer: Self-pay | Admitting: Family Medicine

## 2019-11-15 VITALS — BP 124/70 | HR 49 | Temp 96.9°F | Ht 67.0 in | Wt 169.2 lb

## 2019-11-15 DIAGNOSIS — E1159 Type 2 diabetes mellitus with other circulatory complications: Secondary | ICD-10-CM

## 2019-11-15 DIAGNOSIS — E785 Hyperlipidemia, unspecified: Secondary | ICD-10-CM

## 2019-11-15 DIAGNOSIS — N183 Chronic kidney disease, stage 3 unspecified: Secondary | ICD-10-CM

## 2019-11-15 DIAGNOSIS — E1122 Type 2 diabetes mellitus with diabetic chronic kidney disease: Secondary | ICD-10-CM | POA: Diagnosis not present

## 2019-11-15 DIAGNOSIS — E113511 Type 2 diabetes mellitus with proliferative diabetic retinopathy with macular edema, right eye: Secondary | ICD-10-CM

## 2019-11-15 DIAGNOSIS — E118 Type 2 diabetes mellitus with unspecified complications: Secondary | ICD-10-CM

## 2019-11-15 DIAGNOSIS — I152 Hypertension secondary to endocrine disorders: Secondary | ICD-10-CM

## 2019-11-15 DIAGNOSIS — E1169 Type 2 diabetes mellitus with other specified complication: Secondary | ICD-10-CM | POA: Diagnosis not present

## 2019-11-15 DIAGNOSIS — I10 Essential (primary) hypertension: Secondary | ICD-10-CM

## 2019-11-15 DIAGNOSIS — E039 Hypothyroidism, unspecified: Secondary | ICD-10-CM

## 2019-11-15 LAB — BAYER DCA HB A1C WAIVED: HB A1C (BAYER DCA - WAIVED): 9.3 % — ABNORMAL HIGH (ref <7.0)

## 2019-11-15 MED ORDER — CANAGLIFLOZIN 100 MG PO TABS: 100.0000 mg | ORAL_TABLET | Freq: Every day | ORAL | 5 refills | Status: DC

## 2019-11-15 NOTE — Patient Instructions (Addendum)
Continue to monitor your blood sugars as we discussed and record them. Bring the log to your next appointment.  Take your medications as directed.    Goal Blood glucose:    Fasting (before meals) = 80 to 130   Within 2 hours of eating = less than 180   Understanding your Hemoglobin A1c:     Diabetes Mellitus and Nutrition    I think that you would greatly benefit from seeing a nutritionist. If this is something you are interested in, please call Dr Sykes at [phone] to schedule an appointment.   When you have diabetes (diabetes mellitus), it is very important to have healthy eating habits because your blood sugar (glucose) levels are greatly affected by what you eat and drink. Eating healthy foods in the appropriate amounts, at about the same times every day, can help you:  Control your blood glucose.  Lower your risk of heart disease.  Improve your blood pressure.  Reach or maintain a healthy weight.  Every person with diabetes is different, and each person has different needs for a meal plan. Your health care provider may recommend that you work with a diet and nutrition specialist (dietitian) to make a meal plan that is best for you. Your meal plan may vary depending on factors such as:  The calories you need.  The medicines you take.  Your weight.  Your blood glucose, blood pressure, and cholesterol levels.  Your activity level.  Other health conditions you have, such as heart or kidney disease.  How do carbohydrates affect me? Carbohydrates affect your blood glucose level more than any other type of food. Eating carbohydrates naturally increases the amount of glucose in your blood. Carbohydrate counting is a method for keeping track of how many carbohydrates you eat. Counting carbohydrates is important to keep your blood glucose at a healthy level, especially if you use insulin or take certain oral diabetes medicines. It is important to know how many  carbohydrates you can safely have in each meal. This is different for every person. Your dietitian can help you calculate how many carbohydrates you should have at each meal and for snack. Foods that contain carbohydrates include:  Bread, cereal, rice, pasta, and crackers.  Potatoes and corn.  Peas, beans, and lentils.  Milk and yogurt.  Fruit and juice.  Desserts, such as cakes, cookies, ice cream, and candy.  How does alcohol affect me? Alcohol can cause a sudden decrease in blood glucose (hypoglycemia), especially if you use insulin or take certain oral diabetes medicines. Hypoglycemia can be a life-threatening condition. Symptoms of hypoglycemia (sleepiness, dizziness, and confusion) are similar to symptoms of having too much alcohol. If your health care provider says that alcohol is safe for you, follow these guidelines:  Limit alcohol intake to no more than 1 drink per day for nonpregnant women and 2 drinks per day for men. One drink equals 12 oz of beer, 5 oz of wine, or 1 oz of hard liquor.  Do not drink on an empty stomach.  Keep yourself hydrated with water, diet soda, or unsweetened iced tea.  Keep in mind that regular soda, juice, and other mixers may contain a lot of sugar and must be counted as carbohydrates.  What are tips for following this plan?  Reading food labels  Start by checking the serving size on the label. The amount of calories, carbohydrates, fats, and other nutrients listed on the label are based on one serving of the food. Many foods   contain more than one serving per package.  Check the total grams (g) of carbohydrates in one serving. You can calculate the number of servings of carbohydrates in one serving by dividing the total carbohydrates by 15. For example, if a food has 30 g of total carbohydrates, it would be equal to 2 servings of carbohydrates.  Check the number of grams (g) of saturated and trans fats in one serving. Choose foods that have  low or no amount of these fats.  Check the number of milligrams (mg) of sodium in one serving. Most people should limit total sodium intake to less than 2,300 mg per day.  Always check the nutrition information of foods labeled as "low-fat" or "nonfat". These foods may be higher in added sugar or refined carbohydrates and should be avoided.  Talk to your dietitian to identify your daily goals for nutrients listed on the label.  Shopping  Avoid buying canned, premade, or processed foods. These foods tend to be high in fat, sodium, and added sugar.  Shop around the outside edge of the grocery store. This includes fresh fruits and vegetables, bulk grains, fresh meats, and fresh dairy.  Cooking  Use low-heat cooking methods, such as baking, instead of high-heat cooking methods like deep frying.  Cook using healthy oils, such as olive, canola, or sunflower oil.  Avoid cooking with butter, cream, or high-fat meats.  Meal planning  Eat meals and snacks regularly, preferably at the same times every day. Avoid going long periods of time without eating.  Eat foods high in fiber, such as fresh fruits, vegetables, beans, and whole grains. Talk to your dietitian about how many servings of carbohydrates you can eat at each meal.  Eat 4-6 ounces of lean protein each day, such as lean meat, chicken, fish, eggs, or tofu. 1 ounce is equal to 1 ounce of meat, chicken, or fish, 1 egg, or 1/4 cup of tofu.  Eat some foods each day that contain healthy fats, such as avocado, nuts, seeds, and fish.  Lifestyle   Check your blood glucose regularly.  Exercise at least 30 minutes 5 or more days each week, or as told by your health care provider.  Take medicines as told by your health care provider.  Do not use any products that contain nicotine or tobacco, such as cigarettes and e-cigarettes. If you need help quitting, ask your health care provider.  Work with a counselor or diabetes educator to  identify strategies to manage stress and any emotional and social challenges.   What are some questions to ask my health care provider?  Do I need to meet with a diabetes educator?  Do I need to meet with a dietitian?  What number can I call if I have questions?  When are the best times to check my blood glucose?   Where to find more information:  American Diabetes Association: diabetes.org/food-and-fitness/food  Academy of Nutrition and Dietetics: www.eatright.org/resources/health/diseases-and-conditions/diabetes  National Institute of Diabetes and Digestive and Kidney Diseases (NIH): www.niddk.nih.gov/health-information/diabetes/overview/diet-eating-physical-activity   Summary  A healthy meal plan will help you control your blood glucose and maintain a healthy lifestyle.  Working with a diet and nutrition specialist (dietitian) can help you make a meal plan that is best for you.  Keep in mind that carbohydrates and alcohol have immediate effects on your blood glucose levels. It is important to count carbohydrates and to use alcohol carefully. This information is not intended to replace advice given to you by your health care provider.   Make sure you discuss any questions you have with your health care provider. Document Released: 06/24/2005 Document Revised: 11/01/2016 Document Reviewed: 11/01/2016 Elsevier Interactive Patient Education  2018 [employer].    Canagliflozin oral tablets What is this medicine? CANAGLIFLOZIN (KAN a gli FLOE zin) helps to treat type 2 diabetes. It helps to control blood sugar. Treatment is combined with diet and exercise. This drug may also reduce the risk of heart attack, stroke, or death if you have type 2 diabetes and risk factors for heart disease. If you have diabetic kidney disease with a certain amount of protein in the urine, this drug may reduce your risk of end stage kidney disease (ESKD), worsened kidney function, and hospitalization  for heart failure. This medicine may be used for other purposes; ask your health care provider or pharmacist if you have questions. COMMON BRAND NAME(S): Invokana What should I tell my health care provider before I take this medicine? They need to know if you have any of these conditions:  artery disease  dehydration  diabetic ketoacidosis  diet low in salt  eating less due to illness, surgery, dieting, or any other reason  foot sores  having surgery  high cholesterol  high levels of potassium in the blood  history of amputation  history of pancreatitis or pancreas problems  history of yeast infection of the penis or vagina  if you often drink alcohol  infections in the bladder, kidneys, or urinary tract  kidney disease  liver disease  low blood pressure  nerve damage  on hemodialysis  problems urinating  type 1 diabetes  uncircumcised female  an unusual or allergic reaction to canagliflozin, other medicines, foods, dyes, or preservatives  pregnant or trying to get pregnant  breast-feeding How should I use this medicine? Take this medicine by mouth with a glass of water. Follow the directions on the prescription label. Take it before the first meal of the day. Take your dose at the same time each day. Do not take more often than directed. Do not stop taking except on your doctor's advice. A special MedGuide will be given to you by the pharmacist with each prescription and refill. Be sure to read this information carefully each time. Talk to your pediatrician regarding the use of this medicine in children. Special care may be needed. Overdosage: If you think you have taken too much of this medicine contact a poison control center or emergency room at once. NOTE: This medicine is only for you. Do not share this medicine with others. What if I miss a dose? If you miss a dose, take it as soon as you can. If it is almost time for your next dose, take only that  dose. Do not take double or extra doses. What may interact with this medicine? Do not take this medicine with any of the following medications:  gatifloxacin This medicine may also interact with the following medications:  alcohol  certain medicines for blood pressure, heart disease  digoxin  diuretics  insulin  nateglinide  phenobarbital  phenytoin  repaglinide  rifampin  ritonavir  sulfonylureas like glimepiride, glipizide, glyburide This list may not describe all possible interactions. Give your health care provider a list of all the medicines, herbs, non-prescription drugs, or dietary supplements you use. Also tell them if you smoke, drink alcohol, or use illegal drugs. Some items may interact with your medicine. What should I watch for while using this medicine? Visit your doctor or health care professional for regular  checks on your progress. This medicine can cause a serious condition in which there is too much acid in the blood. If you develop nausea, vomiting, stomach pain, unusual tiredness, or breathing problems, stop taking this medicine and call your doctor right away. If possible, use a ketone dipstick to check for ketones in your urine. A test called the HbA1C (A1C) will be monitored. This is a simple blood test. It measures your blood sugar control over the last 2 to 3 months. You will receive this test every 3 to 6 months. Learn how to check your blood sugar. Learn the symptoms of low and high blood sugar and how to manage them. Always carry a quick-source of sugar with you in case you have symptoms of low blood sugar. Examples include hard sugar candy or glucose tablets. Make sure others know that you can choke if you eat or drink when you develop serious symptoms of low blood sugar, such as seizures or unconsciousness. They must get medical help at once. Tell your doctor or health care professional if you have high blood sugar. You might need to change the dose  of your medicine. If you are sick or exercising more than usual, you might need to change the dose of your medicine. Do not skip meals. Ask your doctor or health care professional if you should avoid alcohol. Many nonprescription cough and cold products contain sugar or alcohol. These can affect blood sugar. Wear a medical ID bracelet or chain, and carry a card that describes your disease and details of your medicine and dosage times. What side effects may I notice from receiving this medicine? Side effects that you should report to your doctor or health care professional as soon as possible:  allergic reactions like skin rash, itching or hives, swelling of the face, lips, or tongue  breathing problems  chest pain  dizziness  feeling faint or lightheaded, falls  muscle weakness  nausea, vomiting, unusual stomach upset or pain  new pain or tenderness, change in skin color, sores or ulcers, or infection in legs or feet  penile discharge, itching, or pain in men  signs and symptoms of a genital infection, such as fever; tenderness, redness, or swelling in the genitals or area from the genitals to the back of the rectum  signs and symptoms of low blood sugar such as feeling anxious, confusion, dizziness, increased hunger, unusually weak or tired, sweating, shakiness, cold, irritable, headache, blurred vision, fast heartbeat, loss of consciousness  signs and symptoms of a urinary tract infection, such as fever, chills, a burning feeling when urinating, blood in the urine, back pain  tingling or numbness in the hands, legs, or feet  trouble passing urine or change in the amount of urine, including an urgent need to urinate more often, in larger amounts, or at night  unusual tiredness  vaginal discharge, itching, or odor in women Side effects that usually do not require medical attention (report to your doctor or health care professional if they continue or are bothersome):  mild  increase in urination  thirsty This list may not describe all possible side effects. Call your doctor for medical advice about side effects. You may report side effects to FDA at 1-800-FDA-1088. Where should I keep my medicine? Keep out of the reach of children. Store at room temperature between 20 and 25 degrees C (68 and 77 degrees F). Throw away any unused medicine after the expiration date. NOTE: This sheet is a summary. It may not  cover all possible information. If you have questions about this medicine, talk to your doctor, pharmacist, or health care provider.  2020 Elsevier/Gold Standard (2019-06-01 13:58:06)

## 2019-11-15 NOTE — Progress Notes (Signed)
Subjective:  Patient ID: Meredith Shaw, female    DOB: Jul 23, 1946, 74 y.o.   MRN: [id number]  Patient Care Team: Baruch Gouty, FNP as PCP - General (Family Medicine) Mcarthur Rossetti, MD as Consulting Physician (Orthopedic Surgery) Rana Snare, MD as Consulting Physician (Urology) Corey Harold, MD as Consulting Physician   Chief Complaint:  Medical Management of Chronic Issues, Diabetes, Hypothyroidism, Hypertension, and Hyperlipidemia   HPI: Meredith Shaw is a 74 y.o. female presenting on 11/15/2019 for Medical Management of Chronic Issues, Diabetes, Hypothyroidism, Hypertension, and Hyperlipidemia   1. Hyperlipidemia associated with type 2 diabetes mellitus ([place]) Compliant with medications - Yes Current medications - pravastatin Side effects from medications - No Diet - generally healthy, does snack a lot Exercise - none  Lab Results  Component Value Date   CHOL 165 08/15/2019   HDL 38 (L) 08/15/2019   LDLCALC 86 08/15/2019   TRIG 244 (H) 08/15/2019   CHOLHDL 4.3 08/15/2019     Family and personal medical history reviewed. Smoking and ETOH history reviewed.    2. Type 2 diabetes mellitus with complication, without long-term current use of insulin (HCC) Pt presents for follow up evaluation of Type 2 diabetes mellitus.  Current symptoms include none. Patient denies foot ulcerations, hyperglycemia, hypoglycemia , increased appetite, nausea, paresthesia of the feet, polydipsia, polyuria, vomiting and weight loss.  Current diabetic medications include metformin 500 mg twice daily. Was supposed to be taking Farxiga 5 mg daily, but is not.  Compliant with meds - Yes  Current monitoring regimen: home blood tests - several times weekly Home blood sugar records: did not bring today Any episodes of hypoglycemia? no  Known diabetic complications: nephropathy, retinopathy and cardiovascular disease Cardiovascular risk factors: advanced age (older than 28 for men,  75 for women), diabetes mellitus, dyslipidemia, hypertension and sedentary lifestyle Eye exam current (within one year): yes Podiatry yearly?  Yes Weight trend: stable Current diet: in general, a "healthy" diet   Current exercise: none  PNA Vaccine UTD?  Yes Hep B Vaccine?  Yes Tdap Vaccine UTD?  No Urine microalbumin UTD? Yes  Is She on ACE inhibitor or angiotensin II receptor blocker?  Yes, lisinopril Is She on statin? Yes pravastatin Is She on ASA 81 mg daily?  No    3. CKD stage 3 due to type 2 diabetes mellitus (HCC) No changes in urinary output. No edema, confusion, weakness, or fatigue.   4. Hypertension associated with type 2 diabetes mellitus ([place]) Complaint with meds - Yes Current Medications - lisinopril, Amlodipine Checking BP at home - No Exercising Regularly - No Watching Salt intake - Yes Pertinent ROS:  Headache - No Fatigue - No Visual Disturbances - Yes Chest pain - No Dyspnea - No Palpitations - No LE edema - No They report good compliance with medications and can restate their regimen by memory. No medication side effects.  Family, social, and smoking history reviewed.   BP Readings from Last 3 Encounters:  11/15/19 124/70  08/15/19 (!) 158/83  05/14/19 (!) 154/83   CMP Latest Ref Rng & Units 08/15/2019 05/14/2019 11/09/2018  Glucose 65 - 99 mg/dL 336(H) 247(H) 218(H)  BUN 8 - 27 mg/dL 27 30(H) 25  Creatinine 0.57 - 1.00 mg/dL 1.29(H) 1.39(H) 1.14(H)  Sodium 134 - 144 mmol/L 141 141 140  Potassium 3.5 - 5.2 mmol/L 3.9 4.3 4.6  Chloride 96 - 106 mmol/L 106 106 103  CO2 20 - 29 mmol/L 19(L) 19(L) 21  Calcium 8.7 - 10.3 mg/dL 9.0 9.4 9.6  Total Protein 6.0 - 8.5 g/dL 6.5 6.4 6.3  Total Bilirubin 0.0 - 1.2 mg/dL 0.3 0.2 0.2  Alkaline Phos 39 - 117 IU/L 64 59 79  AST 0 - 40 IU/L _0 ALT 0 - 32 IU/L _1 5. 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 Coralyn Pear and is currently undergoing treatment in office every 4 weeks in right eye. Has follow up on Friday. Does have visula changes in right eye.   6. Acquired hypothyroidism Compliant with medications - Yes Current medications - Synthroid 137 mcg Adverse side effects - No Weight - stable  Bowel habit changes - No Heat or cold intolerance - No Mood changes - No Changes in sleep habits - No Fatigue - No Skin, hair, or nail changes - No Tremor - No Palpitations - No Edema - No Shortness of breath - No  Lab Results  Component Value Date   TSH 0.616 08/15/2019      Relevant past medical, surgical, family, and social history reviewed and updated as indicated.  Allergies and medications reviewed and updated. Date reviewed: Chart in Epic.   Past Medical History:  Diagnosis Date  . Arthritis    OA AND PAIN BOTH KNEES -RIGHT KNEE HURTS WORSE THAN LEFT  . Cataract   . Diabetes mellitus    ORAL MEDICATION - NO INSULIN  . Hypertension   . Hypertensive retinopathy    OU  . Hypothyroidism   . Kidney stones    NONE AT PRESENT TIME THAT PT IS AWARE OF  . Osteoporosis     Past Surgical History:  Procedure Laterality Date  . ABDOMINAL HYSTERECTOMY    . CATARACT EXTRACTION    . CHOLECYSTECTOMY  1972  . COLONOSCOPY    . EYE SURGERY     RIGHT CATARACT EXTRACTON WITH LENS IMPLANT  . GROWTH REMOVED FROM THYROID    . INTRAMEDULLARY (IM) NAIL INTERTROCHANTERIC Left 02/10/2018   Procedure: INTRAMEDULLARY (IM) NAIL INTERTROCHANTRIC;  Surgeon: Mcarthur Rossetti, MD;  Location: [facility];  Service: Orthopedics;  Laterality: Left;  . INTRAMEDULLARY (IM) NAIL INTERTROCHANTERIC Right 08/20/2018   Procedure: ORIF RIGHT HIP INTERTROC FRACTURE;  Surgeon: Mcarthur Rossetti, MD;  Location: [facility];  Service: Orthopedics;  Laterality: Right;  . left hip surgery   02/2018  . ORIF HIP FRACTURE Right 08/20/2018  . PERCUTANEOUS NEPHROLITHOTOMY    . TOTAL KNEE ARTHROPLASTY  06/09/2012    Procedure: TOTAL KNEE ARTHROPLASTY;  Surgeon: Mcarthur Rossetti, MD;  Location: [facility];  Service: Orthopedics;  Laterality: Right;  Right Total Knee Arthroplasty  . TOTAL KNEE ARTHROPLASTY Left 04/09/2014   Procedure: LEFT TOTAL KNEE ARTHROPLASTY;  Surgeon: Mcarthur Rossetti, MD;  Location: [facility];  Service: Orthopedics;  Laterality: Left;  . TUBAL LIGATION      Social History   Socioeconomic History  . Marital status: Significant Other    Spouse name: Berneta Sages   . Number of children: 2  . Years of education: 41  . Highest education level: Some college, no degree  Occupational History  . Occupation: retired     Fish farm manager: [employer]    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    Comment: bank - 65yr  Tobacco Use  . Smoking status: Never Smoker  . Smokeless tobacco: Never Used  Substance and Sexual Activity  . Alcohol use:  No  . Drug use: No  . Sexual activity: Yes    Birth control/protection: Surgical  Other Topics Concern  . Not on file  Social History Narrative  . Not on file   Social Determinants of Health   Financial Resource Strain: Low Risk   . Difficulty of Paying Living Expenses: Not hard at all  Food Insecurity: No Food Insecurity  . Worried About Charity fundraiser in the Last Year: Never true  . Ran Out of Food in the Last Year: Never true  Transportation Needs: No Transportation Needs  . Lack of Transportation (Medical): No  . Lack of Transportation (Non-Medical): No  Physical Activity: Sufficiently Active  . Days of Exercise per Week: 7 days  . Minutes of Exercise per Session: 30 min  Stress: No Stress Concern Present  . Feeling of Stress : Not at all  Social Connections: Not Isolated  . Frequency of Communication with Friends and Family: More than three times a week  . Frequency of Social Gatherings with Friends and Family: More than three times a week  . Attends Religious Services: More than 4 times per year  . Active Member of Clubs or Organizations: Yes  .  Attends Archivist Meetings: More than 4 times per year  . Marital Status: Married  Human resources officer Violence: Not At Risk  . Fear of Current or Ex-Partner: No  . Emotionally Abused: No  . Physically Abused: No  . Sexually Abused: No    Outpatient Encounter Medications as of 11/15/2019  Medication Sig  . amLODipine (NORVASC) 5 MG tablet Take 1 tablet (5 mg total) by mouth daily.  . Blood Glucose Monitoring Suppl (ONETOUCH VERIO) w/Device KIT 1 each by Does not apply route daily.  . cholecalciferol (VITAMIN D) 1000 units tablet Take 5,000 Units by mouth daily.   Marland Kitchen denosumab (PROLIA) 60 MG/ML SOSY injection Inject 60 mg into the skin every 6 (six) months.  Marland Kitchen glucose blood (ONETOUCH VERIO) test strip Use as instructed  . Lancets (ONETOUCH ULTRASOFT) lancets Use as instructed  . levothyroxine (SYNTHROID) 137 MCG tablet Take 1 tablet (137 mcg total) by mouth every other day. Before breakfast  . lisinopril (ZESTRIL) 20 MG tablet Take 1 tablet (20 mg total) by mouth daily.  . metFORMIN (GLUCOPHAGE) 500 MG tablet Take 1 tablet (500 mg total) by mouth 2 (two) times daily with a meal.  . metoprolol tartrate (LOPRESSOR) 100 MG tablet Take 1 tablet (100 mg total) by mouth 2 (two) times daily.  . naproxen sodium (ALEVE) 220 MG tablet Take 220 mg by mouth 2 (two) times daily as needed (for pain).  . pravastatin (PRAVACHOL) 40 MG tablet Take 1 tablet (40 mg total) by mouth at bedtime.  . canagliflozin (INVOKANA) 100 MG TABS tablet Take 1 tablet (100 mg total) by mouth daily before breakfast.  . [DISCONTINUED] dapagliflozin propanediol (FARXIGA) 5 MG TABS tablet Take 5 mg by mouth daily before breakfast. (Patient not taking: Reported on 11/15/2019)   Facility-Administered Encounter Medications as of 11/15/2019  Medication  . Bevacizumab (AVASTIN) SOLN 1.25 mg  . denosumab (PROLIA) injection 60 mg  . [DISCONTINUED] Bevacizumab (AVASTIN) SOLN 1.25 mg    Allergies  Allergen Reactions  .  Penicillins Itching and Rash    Review of Systems  Constitutional: Negative for activity change, appetite change, chills, diaphoresis, fatigue, fever and unexpected weight change.  HENT: Negative.   Eyes: Positive for visual disturbance. Negative for photophobia.  Respiratory: Negative for cough, chest  tightness and shortness of breath.   Cardiovascular: Negative for chest pain, palpitations and leg swelling.  Gastrointestinal: Negative for abdominal pain, blood in stool, constipation, diarrhea, nausea and vomiting.  Endocrine: Negative.  Negative for cold intolerance, heat intolerance, polydipsia, polyphagia and polyuria.  Genitourinary: Negative for decreased urine volume, difficulty urinating, dysuria, frequency and urgency.  Musculoskeletal: Positive for arthralgias (right hip, minimal) and gait problem (doing great, uses walker when out to prevent falling, no need for walker at home). Negative for myalgias.  Skin: Negative.   Allergic/Immunologic: Negative.   Neurological: Negative for dizziness, tremors, seizures, syncope, facial asymmetry, speech difficulty, weakness, light-headedness, numbness and headaches.  Hematological: Negative.   Psychiatric/Behavioral: Negative for confusion, hallucinations, sleep disturbance and suicidal ideas.  All other systems reviewed and are negative.       Objective:  BP 124/70   Pulse (!) 49   Temp (!) 96.9 F (36.1 C)   Ht 5' 7" (1.702 m)   Wt 169 lb 4 oz (76.8 kg)   SpO2 100%   BMI 26.51 kg/m    Wt Readings from Last 3 Encounters:  11/15/19 169 lb 4 oz (76.8 kg)  08/15/19 175 lb (79.4 kg)  05/14/19 174 lb (78.9 kg)    Physical Exam Vitals and nursing note reviewed.  Constitutional:      General: She is not in acute distress.    Appearance: Normal appearance. She is well-developed, well-groomed and overweight. She is not ill-appearing, toxic-appearing or diaphoretic.  HENT:     Head: Normocephalic and atraumatic.     Jaw: There  is normal jaw occlusion.     Right Ear: Hearing normal.     Left Ear: Hearing normal.     Nose: Nose normal.     Mouth/Throat:     Lips: Pink.     Mouth: Mucous membranes are moist.     Pharynx: Oropharynx is clear. Uvula midline.  Eyes:     General: Lids are normal.     Extraocular Movements: Extraocular movements intact.     Conjunctiva/sclera: Conjunctivae normal.     Pupils: Pupils are equal, round, and reactive to light.  Neck:     Thyroid: No thyroid mass, thyromegaly or thyroid tenderness.     Vascular: No carotid bruit or JVD.     Trachea: Trachea and phonation normal.  Cardiovascular:     Rate and Rhythm: Normal rate and regular rhythm.     Chest Wall: PMI is not displaced.     Pulses: Normal pulses.     Heart sounds: Normal heart sounds. No murmur. No friction rub. No gallop.   Pulmonary:     Effort: Pulmonary effort is normal. No respiratory distress.     Breath sounds: Normal breath sounds. No wheezing.  Abdominal:     General: Bowel sounds are normal. There is no distension or abdominal bruit.     Palpations: Abdomen is soft. There is no hepatomegaly or splenomegaly.     Tenderness: There is no abdominal tenderness. There is no right CVA tenderness or left CVA tenderness.     Hernia: No hernia is present.  Musculoskeletal:     Cervical back: Normal range of motion and neck supple.     Right hip: No tenderness. Decreased range of motion (due to previous fracture and surgical repair).     Right lower leg: No edema.     Left lower leg: No edema.  Lymphadenopathy:     Cervical: No cervical adenopathy.  Skin:  General: Skin is warm and dry.     Capillary Refill: Capillary refill takes less than 2 seconds.     Coloration: Skin is not cyanotic, jaundiced or pale.     Findings: No rash.  Neurological:     General: No focal deficit present.     Mental Status: She is alert and oriented to person, place, and time.     Cranial Nerves: Cranial nerves are intact. No  cranial nerve deficit.     Sensory: Sensation is intact. No sensory deficit.     Motor: Motor function is intact. No weakness.     Coordination: Coordination is intact. Coordination normal.     Gait: Gait abnormal (uses walker due to previous hip fracture, does not want to fall when out).     Deep Tendon Reflexes: Reflexes are normal and symmetric. Reflexes normal.  Psychiatric:        Attention and Perception: Attention and perception normal.        Mood and Affect: Mood and affect normal.        Speech: Speech normal.        Behavior: Behavior normal. Behavior is cooperative.        Thought Content: Thought content normal.        Cognition and Memory: Cognition and memory normal.        Judgment: Judgment normal.     Results for orders placed or performed in visit on 08/15/19  CBC with Differential/Platelet  Result Value Ref Range   WBC 9.5 3.4 - 10.8 x10E3/uL   RBC 4.48 3.77 - 5.28 x10E6/uL   Hemoglobin 13.7 11.1 - 15.9 g/dL   Hematocrit 41.2 34.0 - 46.6 %   MCV 92 79 - 97 fL   MCH 30.6 26.6 - 33.0 pg   MCHC 33.3 31.5 - 35.7 g/dL   RDW 13.5 11.7 - 15.4 %   Platelets 246 150 - 450 x10E3/uL   Neutrophils 66 Not Estab. %   Lymphs 23 Not Estab. %   Monocytes 7 Not Estab. %   Eos 3 Not Estab. %   Basos 1 Not Estab. %   Neutrophils Absolute 6.1 1.4 - 7.0 x10E3/uL   Lymphocytes Absolute 2.2 0.7 - 3.1 x10E3/uL   Monocytes Absolute 0.7 0.1 - 0.9 x10E3/uL   EOS (ABSOLUTE) 0.3 0.0 - 0.4 x10E3/uL   Basophils Absolute 0.1 0.0 - 0.2 x10E3/uL   Immature Granulocytes 0 Not Estab. %   Immature Grans (Abs) 0.0 0.0 - 0.1 x10E3/uL  CMP14+EGFR  Result Value Ref Range   Glucose 336 (H) 65 - 99 mg/dL   BUN 27 8 - 27 mg/dL   Creatinine, Ser 1.29 (H) 0.57 - 1.00 mg/dL   GFR calc non Af Amer 41 (L) >59 mL/min/1.73   GFR calc Af Amer 47 (L) >59 mL/min/1.73   BUN/Creatinine Ratio 21 12 - 28   Sodium 141 134 - 144 mmol/L   Potassium 3.9 3.5 - 5.2 mmol/L   Chloride 106 96 - 106 mmol/L   CO2  19 (L) 20 - 29 mmol/L   Calcium 9.0 8.7 - 10.3 mg/dL   Total Protein 6.5 6.0 - 8.5 g/dL   Albumin 3.9 3.7 - 4.7 g/dL   Globulin, Total 2.6 1.5 - 4.5 g/dL   Albumin/Globulin Ratio 1.5 1.2 - 2.2   Bilirubin Total 0.3 0.0 - 1.2 mg/dL   Alkaline Phosphatase 64 39 - 117 IU/L   AST 18 0 - 40 IU/L   ALT 16 0 -  32 IU/L  Lipid panel  Result Value Ref Range   Cholesterol, Total 165 100 - 199 mg/dL   Triglycerides 244 (H) 0 - 149 mg/dL   HDL 38 (L) >39 mg/dL   VLDL Cholesterol Cal 41 (H) 5 - 40 mg/dL   LDL Chol Calc (NIH) 86 0 - 99 mg/dL   Chol/HDL Ratio 4.3 0.0 - 4.4 ratio  Thyroid Panel With TSH  Result Value Ref Range   TSH 0.616 0.450 - 4.500 uIU/mL   T4, Total 8.8 4.5 - 12.0 ug/dL   T3 Uptake Ratio 30 24 - 39 %   Free Thyroxine Index 2.6 1.2 - 4.9  Bayer DCA Hb A1c Waived  Result Value Ref Range   HB A1C (BAYER DCA - WAIVED) 8.9 (H) <7.0 %       Pertinent labs & imaging results that were available during my care of the patient were reviewed by me and considered in my medical decision making.  Assessment & Plan:  Meredith Shaw was seen today for medical management of chronic issues, diabetes, hypothyroidism, hypertension and hyperlipidemia.  Diagnoses and all orders for this visit:  Hyperlipidemia associated with type 2 diabetes mellitus ([place]) Diet encouraged - increase intake of fresh fruits and vegetables, increase intake of lean proteins. Bake, broil, or grill foods. Avoid fried, greasy, and fatty foods. Avoid fast foods. Increase intake of fiber-rich whole grains. Exercise encouraged - at least 150 minutes per week and advance as tolerated.  Goal BMI < 25. Continue medications as prescribed. Follow up in 3-6 months as discussed.  -     Lipid panel  Type 2 diabetes mellitus with complication, without long-term current use of insulin (HCC) A1C 9.3 in office. Pt states she has not been taking [place]. She can not tolerate increased metformin doses due to nausea and diarrhea. Will add  invokana today. Pt provided samples to last until next visit., 100 mg every morning. Will recheck A1C in 3 months and adjust therapy if necessary. Labs pending. Diet and exercise encouraged.  -     CBC with Differential/Platelet -     CMP14+EGFR -     Lipid panel -     Thyroid Panel With TSH -     Microalbumin / creatinine urine ratio -     Bayer DCA Hb A1c Waived -     canagliflozin (INVOKANA) 100 MG TABS tablet; Take 1 tablet (100 mg total) by mouth daily before breakfast.  CKD stage 3 due to type 2 diabetes mellitus ([place]) No changes in urine output. Labs pending. No confusion, weakness, or fatigue.  -     CBC with Differential/Platelet -     CMP14+EGFR  Hypertension associated with type 2 diabetes mellitus (HCC) BP well controlled. Changes were not made in regimen today. Goal BP is 130/80. Pt aware to report any persistent high or low readings. DASH diet and exercise encouraged. Exercise at least 150 minutes per week and increase as tolerated. Goal BMI > 25. Stress management encouraged. Avoid nicotine and tobacco product use. Avoid excessive alcohol and NSAID's. Avoid more than 2000 mg of sodium daily. Medications as prescribed. Follow up as scheduled.  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 Coralyn Pear on a regular basis. Has been seeing every 4 weeks, will continue.   Acquired hypothyroidism Thyroid disease has been well controlled. Labs are pending. Adjustments to regimen will be made if warranted. Make sure to take medications on an empty stomach with a full glass of water. Make sure to avoid vitamins or supplements for at least 4 hours before and 4 hours after taking medications. Repeat labs in 3 months if adjustments are made and in 6 months if stable.    -     Thyroid Panel With TSH     Continue all other maintenance medications.  Follow up plan: Return in about 3 months (around 02/12/2020), or if symptoms worsen or fail to improve, for DM.  Continue healthy lifestyle choices, including diet (rich in fruits, vegetables, and lean proteins, and low in salt and simple carbohydrates) and exercise (at least 30 minutes of moderate physical activity daily).  Educational handout given for DM  The above assessment and management plan was discussed with the patient. The patient verbalized understanding of and has agreed to the management plan. Patient is aware to call the clinic if they develop any new symptoms or if symptoms persist or worsen. Patient is aware when to return to the clinic for a follow-up visit. Patient educated on when it is appropriate to go to the emergency department.   Monia Pouch, FNP-C [facility] [phone]

## 2019-11-16 ENCOUNTER — Encounter (INDEPENDENT_AMBULATORY_CARE_PROVIDER_SITE_OTHER): Payer: Self-pay | Admitting: Ophthalmology

## 2019-11-16 ENCOUNTER — Other Ambulatory Visit: Payer: Self-pay

## 2019-11-16 ENCOUNTER — Ambulatory Visit (INDEPENDENT_AMBULATORY_CARE_PROVIDER_SITE_OTHER): Payer: Medicare Other | Admitting: Ophthalmology

## 2019-11-16 DIAGNOSIS — I10 Essential (primary) hypertension: Secondary | ICD-10-CM

## 2019-11-16 DIAGNOSIS — Z961 Presence of intraocular lens: Secondary | ICD-10-CM

## 2019-11-16 DIAGNOSIS — E113393 Type 2 diabetes mellitus with moderate nonproliferative diabetic retinopathy without macular edema, bilateral: Secondary | ICD-10-CM

## 2019-11-16 DIAGNOSIS — H35033 Hypertensive retinopathy, bilateral: Secondary | ICD-10-CM

## 2019-11-16 DIAGNOSIS — H3581 Retinal edema: Secondary | ICD-10-CM

## 2019-11-16 DIAGNOSIS — H34831 Tributary (branch) retinal vein occlusion, right eye, with macular edema: Secondary | ICD-10-CM

## 2019-11-16 LAB — CMP14+EGFR
ALT: 12 IU/L (ref 0–32)
AST: 13 IU/L (ref 0–40)
Albumin/Globulin Ratio: 1.6 (ref 1.2–2.2)
Albumin: 3.9 g/dL (ref 3.7–4.7)
Alkaline Phosphatase: 71 IU/L (ref 39–117)
BUN/Creatinine Ratio: 19 (ref 12–28)
BUN: 30 mg/dL — ABNORMAL HIGH (ref 8–27)
Bilirubin Total: 0.3 mg/dL (ref 0.0–1.2)
CO2: 19 mmol/L — ABNORMAL LOW (ref 20–29)
Calcium: 9.6 mg/dL (ref 8.7–10.3)
Chloride: 105 mmol/L (ref 96–106)
Creatinine, Ser: 1.54 mg/dL — ABNORMAL HIGH (ref 0.57–1.00)
GFR calc Af Amer: 38 mL/min/{1.73_m2} — ABNORMAL LOW (ref >59)
GFR calc non Af Amer: 33 mL/min/{1.73_m2} — ABNORMAL LOW (ref >59)
Globulin, Total: 2.5 g/dL (ref 1.5–4.5)
Glucose: 237 mg/dL — ABNORMAL HIGH (ref 65–99)
Potassium: 4.8 mmol/L (ref 3.5–5.2)
Sodium: 141 mmol/L (ref 134–144)
Total Protein: 6.4 g/dL (ref 6.0–8.5)

## 2019-11-16 LAB — CBC WITH DIFFERENTIAL/PLATELET
Basophils Absolute: 0.1 10*3/uL (ref 0.0–0.2)
Basos: 1 %
EOS (ABSOLUTE): 0.4 10*3/uL (ref 0.0–0.4)
Eos: 5 %
Hematocrit: 39.1 % (ref 34.0–46.6)
Hemoglobin: 13.6 g/dL (ref 11.1–15.9)
Immature Grans (Abs): 0.1 10*3/uL (ref 0.0–0.1)
Immature Granulocytes: 1 %
Lymphocytes Absolute: 2.6 10*3/uL (ref 0.7–3.1)
Lymphs: 28 %
MCH: 31.6 pg (ref 26.6–33.0)
MCHC: 34.8 g/dL (ref 31.5–35.7)
MCV: 91 fL (ref 79–97)
Monocytes Absolute: 0.7 10*3/uL (ref 0.1–0.9)
Monocytes: 8 %
Neutrophils Absolute: 5.2 10*3/uL (ref 1.4–7.0)
Neutrophils: 57 %
Platelets: 233 10*3/uL (ref 150–450)
RBC: 4.31 x10E6/uL (ref 3.77–5.28)
RDW: 13.3 % (ref 11.7–15.4)
WBC: 9 10*3/uL (ref 3.4–10.8)

## 2019-11-16 LAB — LIPID PANEL
Chol/HDL Ratio: 4.9 ratio — ABNORMAL HIGH (ref 0.0–4.4)
Cholesterol, Total: 163 mg/dL (ref 100–199)
HDL: 33 mg/dL — ABNORMAL LOW (ref >39)
LDL Chol Calc (NIH): 83 mg/dL (ref 0–99)
Triglycerides: 286 mg/dL — ABNORMAL HIGH (ref 0–149)
VLDL Cholesterol Cal: 47 mg/dL — ABNORMAL HIGH (ref 5–40)

## 2019-11-16 LAB — THYROID PANEL WITH TSH
Free Thyroxine Index: 2.5 (ref 1.2–4.9)
T3 Uptake Ratio: 30 % (ref 24–39)
T4, Total: 8.2 ug/dL (ref 4.5–12.0)
TSH: 0.18 u[IU]/mL — ABNORMAL LOW (ref 0.450–4.500)

## 2019-11-16 MED ORDER — BEVACIZUMAB CHEMO INJECTION 1.25MG/0.05ML SYRINGE FOR KALEIDOSCOPE
1.2500 mg | INTRAVITREAL | Status: AC | PRN
  Administered 2019-11-16: 13:00:00 1.25 mg via INTRAVITREAL

## 2019-11-17 ENCOUNTER — Encounter: Payer: Self-pay | Admitting: Family Medicine

## 2019-11-19 ENCOUNTER — Other Ambulatory Visit: Payer: Medicare Other

## 2019-11-19 ENCOUNTER — Other Ambulatory Visit: Payer: Self-pay | Admitting: Family Medicine

## 2019-11-19 ENCOUNTER — Encounter: Payer: Self-pay | Admitting: Family Medicine

## 2019-11-19 DIAGNOSIS — E1122 Type 2 diabetes mellitus with diabetic chronic kidney disease: Secondary | ICD-10-CM

## 2019-11-19 DIAGNOSIS — E118 Type 2 diabetes mellitus with unspecified complications: Secondary | ICD-10-CM

## 2019-11-19 DIAGNOSIS — N183 Chronic kidney disease, stage 3 unspecified: Secondary | ICD-10-CM

## 2019-11-19 DIAGNOSIS — E890 Postprocedural hypothyroidism: Secondary | ICD-10-CM

## 2019-11-19 MED ORDER — FARXIGA 5 MG PO TABS: 5.0000 mg | ORAL_TABLET | Freq: Every day | ORAL | 2 refills | Status: AC

## 2019-11-19 MED ORDER — LEVOTHYROXINE SODIUM 125 MCG PO TABS: 125.0000 ug | ORAL_TABLET | Freq: Every day | ORAL | 3 refills | Status: DC

## 2019-11-19 MED ORDER — SEMAGLUTIDE 3 MG PO TABS: 3.0000 mg | ORAL_TABLET | Freq: Every day | ORAL | 4 refills | Status: DC

## 2019-11-20 ENCOUNTER — Telehealth: Payer: Self-pay | Admitting: Family Medicine

## 2019-11-20 LAB — BMP8+EGFR
BUN/Creatinine Ratio: 27 (ref 12–28)
BUN: 26 mg/dL (ref 8–27)
CO2: 21 mmol/L (ref 20–29)
Calcium: 9.5 mg/dL (ref 8.7–10.3)
Chloride: 107 mmol/L — ABNORMAL HIGH (ref 96–106)
Creatinine, Ser: 0.98 mg/dL (ref 0.57–1.00)
GFR calc Af Amer: 66 mL/min/{1.73_m2} (ref >59)
GFR calc non Af Amer: 57 mL/min/{1.73_m2} — ABNORMAL LOW (ref >59)
Glucose: 226 mg/dL — ABNORMAL HIGH (ref 65–99)
Potassium: 4.6 mmol/L (ref 3.5–5.2)
Sodium: 143 mmol/L (ref 134–144)

## 2019-11-20 NOTE — Telephone Encounter (Signed)
Patient aware of lab results.

## 2019-11-23 ENCOUNTER — Ambulatory Visit: Payer: Medicare Other | Attending: Internal Medicine

## 2019-11-23 DIAGNOSIS — Z23 Encounter for immunization: Secondary | ICD-10-CM

## 2019-11-23 NOTE — Progress Notes (Signed)
   Covid-19 Vaccination Clinic  Name:  Meredith Shaw    MRN: [id number] DOB: 01-21-1946  11/23/2019  [name] was observed post Covid-19 immunization for 15 minutes without incidence. She was provided with Vaccine Information Sheet and instruction to access the V-Safe system.   [name] was instructed to call 911 with any severe reactions post vaccine: Marland Kitchen Difficulty breathing  . Swelling of your face and throat  . A fast heartbeat  . A bad rash all over your body  . Dizziness and weakness    Immunizations Administered    Name Date Dose VIS Date Route   Pfizer COVID-19 Vaccine 11/23/2019  9:33 AM 0.3 mL 09/21/2019 Intramuscular   Manufacturer: [employer], [employer]   Lot: [id number]   NDC: [address]-8

## 2019-11-26 ENCOUNTER — Telehealth: Payer: Self-pay | Admitting: Family Medicine

## 2019-11-27 NOTE — Telephone Encounter (Signed)
Will you check on this please?  

## 2019-11-30 NOTE — Telephone Encounter (Signed)
Checking in for an update on prolia. Please call back

## 2019-12-04 NOTE — Telephone Encounter (Signed)
No answer, voicemail not set up. 

## 2019-12-07 ENCOUNTER — Encounter: Payer: Self-pay | Admitting: Family Medicine

## 2019-12-07 NOTE — Telephone Encounter (Signed)
Left message to call back.  I have spoken with the Prolia rep and the cost of this medication and administration will be approximately $270 for the patient.  It is buy and bill.  The medication has been ordered from physician services.  I am waiting on the patient to return my call to scheduled their appointment late next week to give [place] time to receive the medication.

## 2019-12-11 NOTE — Progress Notes (Signed)
[facility] Note  12/14/2019     CHIEF COMPLAINT Patient presents for Retina Follow Up   HISTORY OF PRESENT ILLNESS: Meredith Shaw is a 74 y.o. female who presents to the clinic today for:   HPI    Retina Follow Up    Patient presents with  CRVO/BRVO.  In right eye.  This started 4 weeks ago.  Severity is moderate.  I, the attending physician,  performed the HPI with the patient and updated documentation appropriately.          Comments    Patient here for 4 weeks retina follow up for BRVO with CME OU. Patient states vision doing good. No eye pain.        Last edited by Bernarda Caffey, MD on 12/14/2019  9:44 AM. (History)    pt states   Referring physician: Baruch Gouty, [place],  [place] [address]  HISTORICAL INFORMATION:   Selected notes from the MEDICAL RECORD NUMBER Referred by Dr. Maryjane Hurter for DM exam LEE: 01.16.20 (M. Le) [BCVA: OD: 20/40 OS: 20/50-3] Ocular Hx-PDR OU, macular edema OU, cataracts OU PMH-DM (takes metformin), arthritis, HTN, hypothyroidism    CURRENT MEDICATIONS: No current outpatient medications on file. (Ophthalmic Drugs)   No current facility-administered medications for this visit. (Ophthalmic Drugs)   Current Outpatient Medications (Other)  Medication Sig  . amLODipine (NORVASC) 5 MG tablet Take 1 tablet (5 mg total) by mouth daily.  . Blood Glucose Monitoring Suppl (ONETOUCH VERIO) w/Device KIT 1 each by Does not apply route daily.  . cholecalciferol (VITAMIN D) 1000 units tablet Take 5,000 Units by mouth daily.   . dapagliflozin propanediol (FARXIGA) 5 MG TABS tablet Take 5 mg by mouth daily before breakfast.  . denosumab (PROLIA) 60 MG/ML SOSY injection Inject 60 mg into the skin every 6 (six) months.  Marland Kitchen glucose blood (ONETOUCH VERIO) test strip Use as instructed  . Lancets (ONETOUCH ULTRASOFT) lancets Use as instructed  . levothyroxine (SYNTHROID) 125 MCG tablet Take 1 tablet (125 mcg total)  by mouth daily.  Marland Kitchen lisinopril (ZESTRIL) 20 MG tablet Take 1 tablet (20 mg total) by mouth daily.  . metFORMIN (GLUCOPHAGE) 500 MG tablet Take 1 tablet (500 mg total) by mouth 2 (two) times daily with a meal.  . metoprolol tartrate (LOPRESSOR) 100 MG tablet Take 1 tablet (100 mg total) by mouth 2 (two) times daily.  . naproxen sodium (ALEVE) 220 MG tablet Take 220 mg by mouth 2 (two) times daily as needed (for pain).  . pravastatin (PRAVACHOL) 40 MG tablet Take 1 tablet (40 mg total) by mouth at bedtime.  . Semaglutide 3 MG TABS Take 3 mg by mouth daily.   Current Facility-Administered Medications (Other)  Medication Route  . Bevacizumab (AVASTIN) SOLN 1.25 mg Intravitreal  . denosumab (PROLIA) injection 60 mg Subcutaneous      REVIEW OF SYSTEMS: ROS    Positive for: Genitourinary, Musculoskeletal, Endocrine, Eyes   Negative for: Constitutional, Gastrointestinal, Neurological, Skin, HENT, Cardiovascular, Respiratory, Psychiatric, Allergic/Imm, Heme/Lymph   Last edited by Theodore Demark, COA on 12/14/2019  9:22 AM. (History)       ALLERGIES Allergies  Allergen Reactions  . Penicillins Itching and Rash    PAST MEDICAL HISTORY Past Medical History:  Diagnosis Date  . Arthritis    OA AND PAIN BOTH KNEES -RIGHT KNEE HURTS WORSE THAN LEFT  . Cataract   . Diabetes mellitus    ORAL  MEDICATION - NO INSULIN  . Hypertension   . Hypertensive retinopathy    OU  . Hypothyroidism   . Kidney stones    NONE AT PRESENT TIME THAT PT IS AWARE OF  . Osteoporosis    Past Surgical History:  Procedure Laterality Date  . ABDOMINAL HYSTERECTOMY    . CATARACT EXTRACTION    . CHOLECYSTECTOMY  1972  . COLONOSCOPY    . EYE SURGERY     RIGHT CATARACT EXTRACTON WITH LENS IMPLANT  . GROWTH REMOVED FROM THYROID    . INTRAMEDULLARY (IM) NAIL INTERTROCHANTERIC Left 02/10/2018   Procedure: INTRAMEDULLARY (IM) NAIL INTERTROCHANTRIC;  Surgeon: Mcarthur Rossetti, MD;  Location: [facility];  Service:  Orthopedics;  Laterality: Left;  . INTRAMEDULLARY (IM) NAIL INTERTROCHANTERIC Right 08/20/2018   Procedure: ORIF RIGHT HIP INTERTROC FRACTURE;  Surgeon: Mcarthur Rossetti, MD;  Location: [facility];  Service: Orthopedics;  Laterality: Right;  . left hip surgery   02/2018  . ORIF HIP FRACTURE Right 08/20/2018  . PERCUTANEOUS NEPHROLITHOTOMY    . TOTAL KNEE ARTHROPLASTY  06/09/2012   Procedure: TOTAL KNEE ARTHROPLASTY;  Surgeon: Mcarthur Rossetti, MD;  Location: [facility];  Service: Orthopedics;  Laterality: Right;  Right Total Knee Arthroplasty  . TOTAL KNEE ARTHROPLASTY Left 04/09/2014   Procedure: LEFT TOTAL KNEE ARTHROPLASTY;  Surgeon: Mcarthur Rossetti, MD;  Location: [facility];  Service: Orthopedics;  Laterality: Left;  . TUBAL LIGATION      FAMILY HISTORY Family History  Problem Relation Age of Onset  . Heart disease Mother   . Breast cancer Mother        30's  . Stroke Father   . Heart disease Father   . Healthy Daughter   . Healthy Son   . Liver disease Maternal Grandmother        gall stones imbedded in liver - blead to death when in surgery   . Pneumonia Maternal Grandfather   . Diabetes Paternal Grandmother     SOCIAL HISTORY Social History   Tobacco Use  . Smoking status: Never Smoker  . Smokeless tobacco: Never Used  Substance Use Topics  . Alcohol use: No  . Drug use: No         OPHTHALMIC EXAM:  Base Eye Exam    Visual Acuity (Snellen - Linear)      Right Left   Dist cc 20/25 20/20   Dist ph cc NI    Correction: Glasses       Tonometry (Tonopen, 9:20 AM)      Right Left   Pressure 20 17       Pupils      Dark Light Shape React APD   Right 4 3 Round Brisk None   Left 4 3 Round Brisk None       Visual Fields (Counting fingers)      Left Right    Full Full       Extraocular Movement      Right Left    Full, Ortho Full, Ortho       Neuro/Psych    Oriented x3: Yes   Mood/Affect: Normal       Dilation    Both eyes: 1.0%  Mydriacyl, 2.5% Phenylephrine @ 9:20 AM        Slit Lamp and Fundus Exam    Slit Lamp Exam      Right Left   Lids/Lashes Dermatochalasis - upper lid, Meibomian gland dysfunction Dermatochalasis - upper lid, Meibomian gland dysfunction  Conjunctiva/Sclera White and quiet White and quiet   Cornea 1+ Punctate epithelial erosions, Debris in tear film - improved, +ABMD 1+ Punctate epithelial erosions, Debris in tear film   Anterior Chamber deep and clear deep and clear, narrow temporal angle   Iris Round and dilated, No NVI Round and dilated, No NVI   Lens PC IOL in good position, trace Posterior capsular opacification PC IOL in good position with open PC   Vitreous mild Vitreous syneresis, Posterior vitreous detachment, silicone oil bubble mild Vitreous syneresis       Fundus Exam      Right Left   Disc compact, mild tilt, temporal PPA, mild pallor compact, Pink and Sharp, temporal Peripapillary atrophy   C/D Ratio 0.1 0.3   Macula blunted foveal reflex, persistent edema, improving IRH and CWS superior macula good foveal reflex, Retinal pigment epithelial mottling, No edema, rare MA temporal macula   Vessels Vascular attenuation, Tortuous Vascular attenuation, Tortuous   Periphery Attached, scattered reticular degeneration, temporal IRH resolved Attached, reticular degeneration, mild, scattered MA/DBH, focal pigment clumping temporal periphery        Refraction    Wearing Rx      Sphere Cylinder Axis Add   Right [place] +0.50 020 +2.50   Left +0.25 +0.25 130 +2.50   Type: prog          IMAGING AND PROCEDURES  Imaging and Procedures for @TODAY @  OCT, Retina - OU - Both Eyes       Right Eye Quality was good. Central Foveal Thickness: 295. Progression has worsened. Findings include abnormal foveal contour, no SRF, intraretinal fluid, cystoid macular edema (Mild interval increase in IRF/central cyst).   Left Eye Quality was good. Central Foveal Thickness: 285. Progression has  been stable. Findings include normal foveal contour, no IRF, no SRF, vitreomacular adhesion .   Notes *Images captured and stored on drive  Diagnosis / Impression:  OD: superior BRVO w/ CME -- Mild interval increase in IRF/central cyst OS: NFP; no IRF/SRF--stable  Clinical management:  See below  Abbreviations: NFP - Normal foveal profile. CME - cystoid macular edema. PED - pigment epithelial detachment. IRF - intraretinal fluid. SRF - subretinal fluid. EZ - ellipsoid zone. ERM - epiretinal membrane. ORA - outer retinal atrophy. ORT - outer retinal tubulation. SRHM - subretinal hyper-reflective material        Intravitreal Injection, Pharmacologic Agent - OD - Right Eye       Time Out 12/14/2019. 9:47 AM. Confirmed correct patient, procedure, site, and patient consented.   Anesthesia Topical anesthesia was used. Anesthetic medications included Lidocaine 2%, Proparacaine 0.5%.   Procedure Preparation included 5% betadine to ocular surface, eyelid speculum. A supplied (32 g) needle was used.   Injection:  2 mg aflibercept Alfonse Flavors) SOLN   NDC: A3590391, Lot: [id number], Expiration date: 05/06/2020   Route: Intravitreal, Site: Right Eye, Waste: 0.05 mL  Post-op Post injection exam found visual acuity of at least counting fingers. The patient tolerated the procedure well. There were no complications. The patient received written and verbal post procedure care education.                 ASSESSMENT/PLAN:    ICD-10-CM   1. Branch retinal vein occlusion of right eye with macular edema  H34.8310 Intravitreal Injection, Pharmacologic Agent - OD - Right Eye    aflibercept (EYLEA) SOLN 2 mg  2. Retinal edema  H35.81 OCT, Retina - OU - Both Eyes  3. 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 Essential hypertension  I10   5. Hypertensive retinopathy of both eyes  H35.033   6.  Pseudophakia of both eyes  Z96.1     1,2. BRVO w/ CME OD - s/p IVA OD #1 (01.31.20), #2 (02.28.20), #3 (05.08.20), #4 (06.05.20), #5 (07.03.20), #6 (07.31.20), # 7(08.28.20), #8 (10.02.20), #9 (10.30.20), #10 (11.30.20), #11 (01.07.21), #12 (02.02.21) - FA (06.05.20) shows focal area of vascular non-perfusion and telangiectatic vessels superior macula -- consistent with BRVO - BCVA 20/25 OD -- stable - OCT shows mild interval increase in IRF/IRHM, superior macula  - discussed possible resistance to Avastin, very slow progress, and possibility of switching medication - recommend IVE OD #1today, 03.05.21 w/ f/u in 4 wks - RBA of procedure discussed, questions answered - see procedure note  - Eylea4U benefits investigation started 01.07.21 -- approved for 2021  - informed consent for IVA OD signed and scanned on 01.08.21  - Eylea informed consent form signed and scanned on 03.05.21 (OD) - F/U 4 weeks -- DFE/OCT/possible injectoin  3. Mild to moderate nonproliferative diabetic retinopathy OU  - exam with rare, scattered IRH/MA OU  - FA (06.05.20) shows leaking microaneurysms OU; no NV OU  - OCT shows interval increase in IRF OD (BRVO w/ CME as above)  - monitor  4,5. Hypertensive retinopathy OU  - discussed importance of tight BP control  - monitor  6. Pseudophakia OU  - s/p CE/IOL OU  - OD: done at [facility] around 2010; OS: Dr. Nancy Fetter (May 2020)  - beautiful surgeries, doing well  - monitor  Ophthalmic Meds Ordered this visit:  Meds ordered this encounter  Medications  . aflibercept (EYLEA) SOLN 2 mg      Return in 4 weeks (on 01/11/2020) for f/u BRVO OD, DFE, OCT.  There are no Patient Instructions on file for this visit.   Explained the diagnoses, plan, and follow up with the patient and they expressed understanding.  Patient expressed understanding of the importance of proper  follow up care.   This document serves as a record of services personally performed by Gardiner Sleeper, MD, PhD. It was created on their behalf by Ernest Mallick, OA, an ophthalmic assistant. The creation of this record is the provider's dictation and/or activities during the visit.    Electronically signed by: Ernest Mallick, OA 03.02.2021 11:44 PM   Gardiner Sleeper, M.D., Ph.D. Diseases & Surgery of the Retina and Vitreous [facility]  I have reviewed the above documentation for accuracy and completeness, and I agree with the above. Gardiner Sleeper, M.D., Ph.D. 12/15/19 11:44 PM    Abbreviations: M myopia (nearsighted); A astigmatism; H hyperopia (farsighted); P presbyopia; Mrx spectacle prescription;  CTL contact lenses; OD right eye; OS left eye; OU both eyes  XT exotropia; ET esotropia; PEK punctate epithelial keratitis; PEE punctate epithelial erosions; DES dry eye syndrome; MGD meibomian gland dysfunction; ATs artificial tears; PFAT's preservative free artificial tears; [place] nuclear sclerotic cataract; PSC posterior subcapsular cataract; ERM epi-retinal membrane; PVD posterior vitreous detachment; RD retinal detachment; DM diabetes mellitus; DR diabetic retinopathy; NPDR non-proliferative diabetic retinopathy; PDR proliferative diabetic retinopathy; CSME clinically significant macular edema; DME diabetic macular edema; dbh dot blot hemorrhages; CWS cotton wool spot; POAG primary open angle glaucoma; C/D cup-to-disc ratio; HVF humphrey visual field; GVF goldmann visual field; OCT optical coherence tomography; IOP intraocular pressure; BRVO Branch retinal vein  occlusion; CRVO central retinal vein occlusion; CRAO central retinal artery occlusion; BRAO branch retinal artery occlusion; RT retinal tear; SB scleral buckle; PPV pars plana vitrectomy; VH Vitreous hemorrhage; PRP panretinal laser photocoagulation; IVK intravitreal kenalog; VMT vitreomacular traction; MH Macular hole;   NVD neovascularization of the disc; NVE neovascularization elsewhere; AREDS age related eye disease study; ARMD age related macular degeneration; POAG primary open angle glaucoma; EBMD epithelial/anterior basement membrane dystrophy; ACIOL anterior chamber intraocular lens; IOL intraocular lens; PCIOL posterior chamber intraocular lens; Phaco/IOL phacoemulsification with intraocular lens placement; [employer] photorefractive keratectomy; LASIK laser assisted in situ keratomileusis; HTN hypertension; DM diabetes mellitus; COPD chronic obstructive pulmonary disease

## 2019-12-12 ENCOUNTER — Other Ambulatory Visit: Payer: Self-pay

## 2019-12-12 NOTE — Telephone Encounter (Signed)
Appointment scheduled for Prolia shot on 12/13/2019.

## 2019-12-13 ENCOUNTER — Ambulatory Visit (INDEPENDENT_AMBULATORY_CARE_PROVIDER_SITE_OTHER): Payer: Medicare Other

## 2019-12-13 DIAGNOSIS — M810 Age-related osteoporosis without current pathological fracture: Secondary | ICD-10-CM | POA: Diagnosis not present

## 2019-12-13 DIAGNOSIS — M8000XD Age-related osteoporosis with current pathological fracture, unspecified site, subsequent encounter for fracture with routine healing: Secondary | ICD-10-CM

## 2019-12-13 MED ORDER — DENOSUMAB 60 MG/ML ~~LOC~~ SOSY
60.0000 mg | PREFILLED_SYRINGE | Freq: Once | SUBCUTANEOUS | Status: AC
  Administered 2019-12-13: 60 mg via SUBCUTANEOUS

## 2019-12-13 NOTE — Progress Notes (Signed)
Prolia shot given to left upper arm. Patient tolerated well. Buy & Annette Stable

## 2019-12-14 ENCOUNTER — Ambulatory Visit (INDEPENDENT_AMBULATORY_CARE_PROVIDER_SITE_OTHER): Payer: Medicare Other | Admitting: Ophthalmology

## 2019-12-14 ENCOUNTER — Encounter (INDEPENDENT_AMBULATORY_CARE_PROVIDER_SITE_OTHER): Payer: Self-pay | Admitting: Ophthalmology

## 2019-12-14 DIAGNOSIS — H3581 Retinal edema: Secondary | ICD-10-CM | POA: Diagnosis not present

## 2019-12-14 DIAGNOSIS — H35033 Hypertensive retinopathy, bilateral: Secondary | ICD-10-CM | POA: Diagnosis not present

## 2019-12-14 DIAGNOSIS — H34831 Tributary (branch) retinal vein occlusion, right eye, with macular edema: Secondary | ICD-10-CM

## 2019-12-14 DIAGNOSIS — E113393 Type 2 diabetes mellitus with moderate nonproliferative diabetic retinopathy without macular edema, bilateral: Secondary | ICD-10-CM

## 2019-12-14 DIAGNOSIS — Z961 Presence of intraocular lens: Secondary | ICD-10-CM

## 2019-12-14 DIAGNOSIS — I10 Essential (primary) hypertension: Secondary | ICD-10-CM | POA: Diagnosis not present

## 2019-12-15 MED ORDER — AFLIBERCEPT 2MG/0.05ML IZ SOLN FOR KALEIDOSCOPE
2.0000 mg | INTRAVITREAL | Status: AC | PRN
  Administered 2019-12-15: 2 mg via INTRAVITREAL

## 2020-01-09 ENCOUNTER — Other Ambulatory Visit: Payer: Self-pay | Admitting: Family Medicine

## 2020-01-09 DIAGNOSIS — E1159 Type 2 diabetes mellitus with other circulatory complications: Secondary | ICD-10-CM

## 2020-01-10 ENCOUNTER — Encounter: Payer: Self-pay | Admitting: *Deleted

## 2020-01-10 NOTE — Progress Notes (Signed)
[facility] Note  01/11/2020     CHIEF COMPLAINT Patient presents for Retina Follow Up   HISTORY OF PRESENT ILLNESS: Meredith Shaw is a 74 y.o. female who presents to the clinic today for:   HPI    Retina Follow Up    Patient presents with  CRVO/BRVO.  In right eye.  This started weeks ago.  Severity is moderate.  Duration of weeks.  Since onset it is stable.  I, the attending physician,  performed the HPI with the patient and updated documentation appropriately.          Comments    BS: 110 2 weeks ago Pt states vision is about the same OU.  Pt denies eye pain or discomfort.  Pt denies any new or worsening floaters or fol OU.       Last edited by Bernarda Caffey, MD on 01/11/2020 10:23 AM. (History)    pt states   Referring physician: Baruch Gouty, [place],  [place] [id number]  HISTORICAL INFORMATION:   Selected notes from the MEDICAL RECORD NUMBER Referred by Dr. Maryjane Hurter for DM exam LEE: 01.16.20 (M. Le) [BCVA: OD: 20/40 OS: 20/50-3] Ocular Hx-PDR OU, macular edema OU, cataracts OU PMH-DM (takes metformin), arthritis, HTN, hypothyroidism    CURRENT MEDICATIONS: No current outpatient medications on file. (Ophthalmic Drugs)   No current facility-administered medications for this visit. (Ophthalmic Drugs)   Current Outpatient Medications (Other)  Medication Sig  . amLODipine (NORVASC) 5 MG tablet TAKE ONE (1) TABLET EACH DAY  . Blood Glucose Monitoring Suppl (ONETOUCH VERIO) w/Device KIT 1 each by Does not apply route daily.  . cholecalciferol (VITAMIN D) 1000 units tablet Take 5,000 Units by mouth daily.   . dapagliflozin propanediol (FARXIGA) 5 MG TABS tablet Take 5 mg by mouth daily before breakfast.  . denosumab (PROLIA) 60 MG/ML SOSY injection Inject 60 mg into the skin every 6 (six) months.  Marland Kitchen glucose blood (ONETOUCH VERIO) test strip Use as instructed  . Lancets (ONETOUCH ULTRASOFT) lancets Use as instructed  .  levothyroxine (SYNTHROID) 125 MCG tablet Take 1 tablet (125 mcg total) by mouth daily.  Marland Kitchen lisinopril (ZESTRIL) 20 MG tablet Take 1 tablet (20 mg total) by mouth daily.  . metFORMIN (GLUCOPHAGE) 500 MG tablet Take 1 tablet (500 mg total) by mouth 2 (two) times daily with a meal.  . metoprolol tartrate (LOPRESSOR) 100 MG tablet TAKE ONE TABLET BY MOUTH TWICE DAILY  . naproxen sodium (ALEVE) 220 MG tablet Take 220 mg by mouth 2 (two) times daily as needed (for pain).  . pravastatin (PRAVACHOL) 40 MG tablet Take 1 tablet (40 mg total) by mouth at bedtime.  . Semaglutide 3 MG TABS Take 3 mg by mouth daily.   Current Facility-Administered Medications (Other)  Medication Route  . Bevacizumab (AVASTIN) SOLN 1.25 mg Intravitreal  . denosumab (PROLIA) injection 60 mg Subcutaneous      REVIEW OF SYSTEMS: ROS    Positive for: Genitourinary, Musculoskeletal, Endocrine, Eyes   Negative for: Constitutional, Gastrointestinal, Neurological, Skin, HENT, Cardiovascular, Respiratory, Psychiatric, Allergic/Imm, Heme/Lymph   Last edited by Doneen Poisson on 01/11/2020  8:46 AM. (History)       ALLERGIES Allergies  Allergen Reactions  . Penicillins Itching and Rash    PAST MEDICAL HISTORY Past Medical History:  Diagnosis Date  . Arthritis    OA AND PAIN BOTH KNEES -RIGHT KNEE HURTS WORSE THAN LEFT  . Cataract   .  Diabetes mellitus    ORAL MEDICATION - NO INSULIN  . Hypertension   . Hypertensive retinopathy    OU  . Hypothyroidism   . Kidney stones    NONE AT PRESENT TIME THAT PT IS AWARE OF  . Osteoporosis    Past Surgical History:  Procedure Laterality Date  . ABDOMINAL HYSTERECTOMY    . CATARACT EXTRACTION    . CHOLECYSTECTOMY  1972  . COLONOSCOPY    . EYE SURGERY     RIGHT CATARACT EXTRACTON WITH LENS IMPLANT  . GROWTH REMOVED FROM THYROID    . INTRAMEDULLARY (IM) NAIL INTERTROCHANTERIC Left 02/10/2018   Procedure: INTRAMEDULLARY (IM) NAIL INTERTROCHANTRIC;  Surgeon: Mcarthur Rossetti, MD;  Location: [facility];  Service: Orthopedics;  Laterality: Left;  . INTRAMEDULLARY (IM) NAIL INTERTROCHANTERIC Right 08/20/2018   Procedure: ORIF RIGHT HIP INTERTROC FRACTURE;  Surgeon: Mcarthur Rossetti, MD;  Location: [facility];  Service: Orthopedics;  Laterality: Right;  . left hip surgery   02/2018  . ORIF HIP FRACTURE Right 08/20/2018  . PERCUTANEOUS NEPHROLITHOTOMY    . TOTAL KNEE ARTHROPLASTY  06/09/2012   Procedure: TOTAL KNEE ARTHROPLASTY;  Surgeon: Mcarthur Rossetti, MD;  Location: [facility];  Service: Orthopedics;  Laterality: Right;  Right Total Knee Arthroplasty  . TOTAL KNEE ARTHROPLASTY Left 04/09/2014   Procedure: LEFT TOTAL KNEE ARTHROPLASTY;  Surgeon: Mcarthur Rossetti, MD;  Location: [facility];  Service: Orthopedics;  Laterality: Left;  . TUBAL LIGATION      FAMILY HISTORY Family History  Problem Relation Age of Onset  . Heart disease Mother   . Breast cancer Mother        37's  . Stroke Father   . Heart disease Father   . Healthy Daughter   . Healthy Son   . Liver disease Maternal Grandmother        gall stones imbedded in liver - blead to death when in surgery   . Pneumonia Maternal Grandfather   . Diabetes Paternal Grandmother     SOCIAL HISTORY Social History   Tobacco Use  . Smoking status: Never Smoker  . Smokeless tobacco: Never Used  Substance Use Topics  . Alcohol use: No  . Drug use: No         OPHTHALMIC EXAM:  Base Eye Exam    Visual Acuity (Snellen - Linear)      Right Left   Dist cc 20/25 -2 20/20 -2   Correction: Glasses       Tonometry (Tonopen, 8:48 AM)      Right Left   Pressure 16 17       Pupils      Dark Light Shape React APD   Right 4 3 Round Brisk 0   Left 4 3 Round Brisk 0       Visual Fields      Left Right    Full Full       Extraocular Movement      Right Left    Full Full       Neuro/Psych    Oriented x3: Yes   Mood/Affect: Normal       Dilation    Both eyes: 1.0% Mydriacyl,  2.5% Phenylephrine @ 8:48 AM        Slit Lamp and Fundus Exam    Slit Lamp Exam      Right Left   Lids/Lashes Dermatochalasis - upper lid, Meibomian gland dysfunction Dermatochalasis - upper lid, Meibomian gland dysfunction   Conjunctiva/Sclera  White and quiet White and quiet   Cornea 1+ Punctate epithelial erosions, Debris in tear film - improved, +ABMD 1+ Punctate epithelial erosions, Debris in tear film   Anterior Chamber deep and clear deep and clear, narrow temporal angle   Iris Round and dilated, No NVI Round and dilated, No NVI   Lens PC IOL in good position, trace Posterior capsular opacification PC IOL in good position with open PC   Vitreous mild Vitreous syneresis, Posterior vitreous detachment, silicone oil bubble mild Vitreous syneresis       Fundus Exam      Right Left   Disc compact, mild tilt, temporal PPA, mild pallor compact, Pink and Sharp, temporal Peripapillary atrophy   C/D Ratio 0.1 0.3   Macula blunted foveal reflex, persistent edema, improving IRH, CWS, and exudate superior macula good foveal reflex, Retinal pigment epithelial mottling, No edema, rare MA temporal macula   Vessels Vascular attenuation, Tortuous Vascular attenuation, Tortuous   Periphery Attached, scattered reticular degeneration, temporal IRH resolved Attached, reticular degeneration, mild, scattered MA/DBH, focal pigment clumping temporal periphery        Refraction    Wearing Rx      Sphere Cylinder Axis Add   Right [place] +0.50 020 +2.50   Left +0.25 +0.25 130 +2.50   Type: prog          IMAGING AND PROCEDURES  Imaging and Procedures for @TODAY @  OCT, Retina - OU - Both Eyes       Right Eye Quality was good. Central Foveal Thickness: 272. Progression has improved. Findings include abnormal foveal contour, no SRF, intraretinal fluid, cystoid macular edema (Mild interval improvement in central cyst and superior IRF).   Left Eye Quality was good. Central Foveal Thickness: 281.  Progression has been stable. Findings include normal foveal contour, no IRF, no SRF, vitreomacular adhesion .   Notes *Images captured and stored on drive  Diagnosis / Impression:  OD: superior BRVO w/ CME -- Mild interval improvement in central cyst and superior IRF OS: NFP; no IRF/SRF--stable  Clinical management:  See below  Abbreviations: NFP - Normal foveal profile. CME - cystoid macular edema. PED - pigment epithelial detachment. IRF - intraretinal fluid. SRF - subretinal fluid. EZ - ellipsoid zone. ERM - epiretinal membrane. ORA - outer retinal atrophy. ORT - outer retinal tubulation. SRHM - subretinal hyper-reflective material        Intravitreal Injection, Pharmacologic Agent - OD - Right Eye       Time Out 01/11/2020. 8:49 AM. Confirmed correct patient, procedure, site, and patient consented.   Anesthesia Topical anesthesia was used. Anesthetic medications included Lidocaine 2%, Proparacaine 0.5%.   Procedure Preparation included 5% betadine to ocular surface, eyelid speculum. A (32g) needle was used.   Injection:  2 mg aflibercept Alfonse Flavors) SOLN   NDC: [id number], Lot: [id number], Expiration date: 03/07/2020   Route: Intravitreal, Site: Right Eye, Waste: 0.05 mL  Post-op Post injection exam found visual acuity of at least counting fingers. The patient tolerated the procedure well. There were no complications. The patient received written and verbal post procedure care education.                 ASSESSMENT/PLAN:    ICD-10-CM   1. Branch retinal vein occlusion of right eye with macular edema  H34.8310 Intravitreal Injection, Pharmacologic Agent - OD - Right Eye    aflibercept (EYLEA) SOLN 2 mg  2. Retinal edema  H35.81 OCT, Retina - OU - Both Eyes  3.  Moderate nonproliferative diabetic retinopathy of both eyes without macular edema associated with type 2 diabetes mellitus ([place])  I43.3295   4. Essential hypertension  I10   5. Hypertensive retinopathy of both  eyes  H35.033   6. Pseudophakia of both eyes  Z96.1     1,2. BRVO w/ CME OD - s/p IVA OD #1 (01.31.20), #2 (02.28.20), #3 (05.08.20), #4 (06.05.20), #5 (07.03.20), #6 (07.31.20), # 7(08.28.20), #8 (10.02.20), #9 (10.30.20), #10 (11.30.20), #11 (01.07.21), #12 (02.02.21)  - discussed possible resistance to Avastin, very slow progress, and possibility of switching medication  - s/p IVE OD #1 (03.05.21) - FA (06.05.20) shows focal area of vascular non-perfusion and telangiectatic vessels superior macula -- consistent with BRVO - BCVA 20/25 OD -- stable - OCT shows mild interval improvement in central cyst and superior IRF - recommend IVE OD #2today, 04.02.21 w/ f/u in 4 wks - RBA of procedure discussed, questions answered - see procedure note  - Eylea4U benefits investigation started 01.07.21 -- approved for 2021  - informed consent for IVA OD signed and scanned on 01.08.21  - Eylea informed consent form signed and scanned on 03.05.21 (OD) - F/U 4 weeks -- DFE/OCT/possible injectoin  3. Mild to moderate nonproliferative diabetic retinopathy OU  - exam with rare, scattered IRH/MA OU  - FA (06.05.20) shows leaking microaneurysms OU; no NV OU  - OCT shows interval increase in IRF OD (BRVO w/ CME as above)  - monitor  4,5. Hypertensive retinopathy OU  - discussed importance of tight BP control  - monitor  6. Pseudophakia OU  - s/p CE/IOL OU  - OD: done at [facility] around 2010; OS: Dr. Nancy Fetter (May 2020)  - beautiful surgeries, doing well  - monitor  Ophthalmic Meds Ordered this visit:  Meds ordered this encounter  Medications  . aflibercept (EYLEA) SOLN 2 mg      Return in about 4 weeks (around 02/08/2020) for BRVO OD, Dilated exam, OCT.  There are no Patient Instructions on file for this visit.   Explained the diagnoses, plan, and follow up with the patient and they expressed  understanding.  Patient expressed understanding of the importance of proper follow up care.   This document serves as a record of services personally performed by Gardiner Sleeper, MD, PhD. It was created on their behalf by Ernest Mallick, OA, an ophthalmic assistant. The creation of this record is the provider's dictation and/or activities during the visit.    Electronically signed by: Ernest Mallick, OA 04.01.2021 12:53 PM   Gardiner Sleeper, M.D., Ph.D. Diseases & Surgery of the Retina and Vitreous [facility]  I have reviewed the above documentation for accuracy and completeness, and I agree with the above. Gardiner Sleeper, M.D., Ph.D. 01/11/20 12:54 PM   Abbreviations: M myopia (nearsighted); A astigmatism; H hyperopia (farsighted); P presbyopia; Mrx spectacle prescription;  CTL contact lenses; OD right eye; OS left eye; OU both eyes  XT exotropia; ET esotropia; PEK punctate epithelial keratitis; PEE punctate epithelial erosions; DES dry eye syndrome; MGD meibomian gland dysfunction; ATs artificial tears; PFAT's preservative free artificial tears; [place] nuclear sclerotic cataract; PSC posterior subcapsular cataract; ERM epi-retinal membrane; PVD posterior vitreous detachment; RD retinal detachment; DM diabetes mellitus; DR diabetic retinopathy; NPDR non-proliferative diabetic retinopathy; PDR proliferative diabetic retinopathy; CSME clinically significant macular edema; DME diabetic macular edema; dbh dot blot hemorrhages; CWS cotton wool spot; POAG primary open angle glaucoma; C/D cup-to-disc ratio; HVF humphrey visual field; GVF goldmann  visual field; OCT optical coherence tomography; IOP intraocular pressure; BRVO Branch retinal vein occlusion; CRVO central retinal vein occlusion; CRAO central retinal artery occlusion; BRAO branch retinal artery occlusion; RT retinal tear; SB scleral buckle; PPV pars plana vitrectomy; VH Vitreous hemorrhage; PRP panretinal laser  photocoagulation; IVK intravitreal kenalog; VMT vitreomacular traction; MH Macular hole;  NVD neovascularization of the disc; NVE neovascularization elsewhere; AREDS age related eye disease study; ARMD age related macular degeneration; POAG primary open angle glaucoma; EBMD epithelial/anterior basement membrane dystrophy; ACIOL anterior chamber intraocular lens; IOL intraocular lens; PCIOL posterior chamber intraocular lens; Phaco/IOL phacoemulsification with intraocular lens placement; [place] photorefractive keratectomy; LASIK laser assisted in situ keratomileusis; HTN hypertension; DM diabetes mellitus; COPD chronic obstructive pulmonary disease

## 2020-01-11 ENCOUNTER — Ambulatory Visit (INDEPENDENT_AMBULATORY_CARE_PROVIDER_SITE_OTHER): Payer: Medicare Other | Admitting: Ophthalmology

## 2020-01-11 ENCOUNTER — Encounter (INDEPENDENT_AMBULATORY_CARE_PROVIDER_SITE_OTHER): Payer: Self-pay | Admitting: Ophthalmology

## 2020-01-11 DIAGNOSIS — H3581 Retinal edema: Secondary | ICD-10-CM | POA: Diagnosis not present

## 2020-01-11 DIAGNOSIS — H34831 Tributary (branch) retinal vein occlusion, right eye, with macular edema: Secondary | ICD-10-CM | POA: Diagnosis not present

## 2020-01-11 DIAGNOSIS — I10 Essential (primary) hypertension: Secondary | ICD-10-CM

## 2020-01-11 DIAGNOSIS — E113393 Type 2 diabetes mellitus with moderate nonproliferative diabetic retinopathy without macular edema, bilateral: Secondary | ICD-10-CM | POA: Diagnosis not present

## 2020-01-11 DIAGNOSIS — Z961 Presence of intraocular lens: Secondary | ICD-10-CM

## 2020-01-11 DIAGNOSIS — H35033 Hypertensive retinopathy, bilateral: Secondary | ICD-10-CM

## 2020-01-11 MED ORDER — AFLIBERCEPT 2MG/0.05ML IZ SOLN FOR KALEIDOSCOPE
2.0000 mg | INTRAVITREAL | Status: AC | PRN
  Administered 2020-01-11: 13:00:00 2 mg via INTRAVITREAL

## 2020-02-06 NOTE — Progress Notes (Signed)
[facility] Note  02/08/2020     CHIEF COMPLAINT Patient presents for Retina Follow Up   HISTORY OF PRESENT ILLNESS: Meredith Shaw is a 74 y.o. female who presents to the clinic today for:   HPI    Retina Follow Up    Patient presents with  CRVO/BRVO.  In right eye.  This started 4 weeks ago.  Severity is moderate.  I, the attending physician,  performed the HPI with the patient and updated documentation appropriately.          Comments    Patient here for 4 weeks retina follow up for BRVO OD. Patient states vision doing good. No eye pain.        Last edited by Bernarda Caffey, MD on 02/08/2020 11:00 AM. (History)    pt states   Referring physician: Baruch Gouty, FNP No address on file  HISTORICAL INFORMATION:   Selected notes from the MEDICAL RECORD NUMBER Referred by Dr. Maryjane Hurter for DM exam LEE: 01.16.20 (M. Le) [BCVA: OD: 20/40 OS: 20/50-3] Ocular Hx-PDR OU, macular edema OU, cataracts OU PMH-DM (takes metformin), arthritis, HTN, hypothyroidism    CURRENT MEDICATIONS: No current outpatient medications on file. (Ophthalmic Drugs)   No current facility-administered medications for this visit. (Ophthalmic Drugs)   Current Outpatient Medications (Other)  Medication Sig  . amLODipine (NORVASC) 5 MG tablet TAKE ONE (1) TABLET EACH DAY  . Blood Glucose Monitoring Suppl (ONETOUCH VERIO) w/Device KIT 1 each by Does not apply route daily.  . cholecalciferol (VITAMIN D) 1000 units tablet Take 5,000 Units by mouth daily.   . dapagliflozin propanediol (FARXIGA) 5 MG TABS tablet Take 5 mg by mouth daily before breakfast.  . denosumab (PROLIA) 60 MG/ML SOSY injection Inject 60 mg into the skin every 6 (six) months.  Marland Kitchen glucose blood (ONETOUCH VERIO) test strip Use as instructed  . Lancets (ONETOUCH ULTRASOFT) lancets Use as instructed  . levothyroxine (SYNTHROID) 125 MCG tablet Take 1 tablet (125 mcg total) by mouth daily.  Marland Kitchen lisinopril (ZESTRIL)  20 MG tablet Take 1 tablet (20 mg total) by mouth daily.  . metFORMIN (GLUCOPHAGE) 500 MG tablet Take 1 tablet (500 mg total) by mouth 2 (two) times daily with a meal.  . metoprolol tartrate (LOPRESSOR) 100 MG tablet TAKE ONE TABLET BY MOUTH TWICE DAILY  . naproxen sodium (ALEVE) 220 MG tablet Take 220 mg by mouth 2 (two) times daily as needed (for pain).  . pravastatin (PRAVACHOL) 40 MG tablet Take 1 tablet (40 mg total) by mouth at bedtime.  . Semaglutide 3 MG TABS Take 3 mg by mouth daily.   Current Facility-Administered Medications (Other)  Medication Route  . Bevacizumab (AVASTIN) SOLN 1.25 mg Intravitreal  . denosumab (PROLIA) injection 60 mg Subcutaneous      REVIEW OF SYSTEMS: ROS    Positive for: Genitourinary, Musculoskeletal, Endocrine, Eyes   Negative for: Constitutional, Gastrointestinal, Neurological, Skin, HENT, Cardiovascular, Respiratory, Psychiatric, Allergic/Imm, Heme/Lymph   Last edited by Theodore Demark, COA on 02/08/2020  8:48 AM. (History)       ALLERGIES Allergies  Allergen Reactions  . Penicillins Itching and Rash    PAST MEDICAL HISTORY Past Medical History:  Diagnosis Date  . Arthritis    OA AND PAIN BOTH KNEES -RIGHT KNEE HURTS WORSE THAN LEFT  . Cataract   . Diabetes mellitus    ORAL MEDICATION - NO INSULIN  . Hypertension   . Hypertensive retinopathy  OU  . Hypothyroidism   . Kidney stones    NONE AT PRESENT TIME THAT PT IS AWARE OF  . Osteoporosis    Past Surgical History:  Procedure Laterality Date  . ABDOMINAL HYSTERECTOMY    . CATARACT EXTRACTION    . CHOLECYSTECTOMY  1972  . COLONOSCOPY    . EYE SURGERY     RIGHT CATARACT EXTRACTON WITH LENS IMPLANT  . GROWTH REMOVED FROM THYROID    . INTRAMEDULLARY (IM) NAIL INTERTROCHANTERIC Left 02/10/2018   Procedure: INTRAMEDULLARY (IM) NAIL INTERTROCHANTRIC;  Surgeon: Mcarthur Rossetti, MD;  Location: [place];  Service: Orthopedics;  Laterality: Left;  . INTRAMEDULLARY (IM) NAIL  INTERTROCHANTERIC Right 08/20/2018   Procedure: ORIF RIGHT HIP INTERTROC FRACTURE;  Surgeon: Mcarthur Rossetti, MD;  Location: [place];  Service: Orthopedics;  Laterality: Right;  . left hip surgery   02/2018  . ORIF HIP FRACTURE Right 08/20/2018  . PERCUTANEOUS NEPHROLITHOTOMY    . TOTAL KNEE ARTHROPLASTY  06/09/2012   Procedure: TOTAL KNEE ARTHROPLASTY;  Surgeon: Mcarthur Rossetti, MD;  Location: [facility];  Service: Orthopedics;  Laterality: Right;  Right Total Knee Arthroplasty  . TOTAL KNEE ARTHROPLASTY Left 04/09/2014   Procedure: LEFT TOTAL KNEE ARTHROPLASTY;  Surgeon: Mcarthur Rossetti, MD;  Location: [place];  Service: Orthopedics;  Laterality: Left;  . TUBAL LIGATION      FAMILY HISTORY Family History  Problem Relation Age of Onset  . Heart disease Mother   . Breast cancer Mother        48's  . Stroke Father   . Heart disease Father   . Healthy Daughter   . Healthy Son   . Liver disease Maternal Grandmother        gall stones imbedded in liver - blead to death when in surgery   . Pneumonia Maternal Grandfather   . Diabetes Paternal Grandmother     SOCIAL HISTORY Social History   Tobacco Use  . Smoking status: Never Smoker  . Smokeless tobacco: Never Used  Substance Use Topics  . Alcohol use: No  . Drug use: No         OPHTHALMIC EXAM:  Base Eye Exam    Visual Acuity (Snellen - Linear)      Right Left   Dist cc 20/25 20/20   Dist ph cc NI    Correction: Glasses       Tonometry (Tonopen, 8:46 AM)      Right Left   Pressure 14 13       Pupils      Dark Light Shape React APD   Right 4 3 Round Brisk None   Left 4 3 Round Brisk None       Visual Fields (Counting fingers)      Left Right    Full Full       Extraocular Movement      Right Left    Full, Ortho Full, Ortho       Neuro/Psych    Oriented x3: Yes   Mood/Affect: Normal       Dilation    Both eyes: 1.0% Mydriacyl, 2.5% Phenylephrine @ 8:46 AM        Slit Lamp and  Fundus Exam    Slit Lamp Exam      Right Left   Lids/Lashes Dermatochalasis - upper lid, Meibomian gland dysfunction Dermatochalasis - upper lid, Meibomian gland dysfunction   Conjunctiva/Sclera White and quiet White and quiet   Cornea 1+ Punctate epithelial  erosions, Debris in tear film - improved, +ABMD 1+ Punctate epithelial erosions, Debris in tear film   Anterior Chamber deep and clear deep and clear, narrow temporal angle   Iris Round and dilated, No NVI Round and dilated, No NVI   Lens PC IOL in good position, trace Posterior capsular opacification PC IOL in good position with open PC   Vitreous mild Vitreous syneresis, Posterior vitreous detachment, silicone oil bubble mild Vitreous syneresis       Fundus Exam      Right Left   Disc compact, mild tilt, temporal PPA, mild pallor compact, Pink and Sharp, temporal Peripapillary atrophy   C/D Ratio 0.1 0.3   Macula blunted foveal reflex, persistent edema, improving IRH, and exudate superior macula good foveal reflex, Retinal pigment epithelial mottling, No edema, rare MA temporal macula   Vessels Vascular attenuation, Tortuous Vascular attenuation, Tortuous   Periphery Attached, scattered reticular degeneration, temporal IRH resolved Attached, reticular degeneration, mild, scattered MA/DBH, focal pigment clumping temporal periphery        Refraction    Wearing Rx      Sphere Cylinder Axis Add   Right [place] +0.50 020 +2.50   Left +0.25 +0.25 130 +2.50   Type: prog          IMAGING AND PROCEDURES  Imaging and Procedures for _0 @  OCT, Retina - OU - Both Eyes       Right Eye Quality was good. Central Foveal Thickness: 266. Progression has improved. Findings include abnormal foveal contour, no SRF, intraretinal fluid, cystoid macular edema (Mild interval improvement in central cysts and superior IRF).   Left Eye Quality was good. Central Foveal Thickness: 277. Progression has been stable. Findings include normal foveal  contour, no IRF, no SRF, vitreomacular adhesion .   Notes *Images captured and stored on drive  Diagnosis / Impression:  OD: superior BRVO w/ CME -- Mild interval improvement in central cysts and superior IRF OS: NFP; no IRF/SRF--stable  Clinical management:  See below  Abbreviations: NFP - Normal foveal profile. CME - cystoid macular edema. PED - pigment epithelial detachment. IRF - intraretinal fluid. SRF - subretinal fluid. EZ - ellipsoid zone. ERM - epiretinal membrane. ORA - outer retinal atrophy. ORT - outer retinal tubulation. SRHM - subretinal hyper-reflective material        Intravitreal Injection, Pharmacologic Agent - OD - Right Eye       Time Out 02/08/2020. 9:40 AM. Confirmed correct patient, procedure, site, and patient consented.   Anesthesia Topical anesthesia was used. Anesthetic medications included Lidocaine 2%, Proparacaine 0.5%.   Procedure Preparation included 5% betadine to ocular surface, eyelid speculum. A (33g) needle was used.   Injection:  2 mg aflibercept Alfonse Flavors) SOLN   NDC: [id number], Lot: [id number], Expiration date: 07/07/2020   Route: Intravitreal, Site: Right Eye, Waste: 0.05 mL  Post-op Post injection exam found visual acuity of at least counting fingers. The patient tolerated the procedure well. There were no complications. The patient received written and verbal post procedure care education.                 ASSESSMENT/PLAN:    ICD-10-CM   1. Branch retinal vein occlusion of right eye with macular edema  H34.8310 Intravitreal Injection, Pharmacologic Agent - OD - Right Eye    aflibercept (EYLEA) SOLN 2 mg  2. Retinal edema  H35.81 OCT, Retina - OU - Both Eyes  3. 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 Essential hypertension  I10   5. Hypertensive retinopathy of both eyes  H35.033   6. Pseudophakia of both eyes  Z96.1      1,2. BRVO w/ CME OD - s/p IVA OD #1 (01.31.20), #2 (02.28.20), #3 (05.08.20), #4 (06.05.20), #5 (07.03.20), #6 (07.31.20), # 7(08.28.20), #8 (10.02.20), #9 (10.30.20), #10 (11.30.20), #11 (01.07.21), #12 (02.02.21)  - discussed possible resistance to Avastin, very slow progress, and possibility of switching medication  - s/p IVE OD #1 (03.05.21), #2 (04.02.21) - FA (06.05.20) shows focal area of vascular non-perfusion and telangiectatic vessels superior macula -- consistent with BRVO - BCVA 20/25 OD -- stable - OCT shows mild interval improvement in central cyst and superior IRF - recommend IVE OD #3today, 04.30.21 w/ f/u in 4 wks - RBA of procedure discussed, questions answered - see procedure note  - Eylea4U benefits investigation started 01.07.21 -- approved for 2021  - informed consent for IVA OD signed and scanned on 01.08.21  - Eylea informed consent form signed and scanned on 03.05.21 (OD) - F/U 4 weeks -- DFE/OCT/possible injectoin  3. Mild to moderate nonproliferative diabetic retinopathy OU  - exam with rare, scattered IRH/MA OU  - FA (06.05.20) shows leaking microaneurysms OU; no NV OU  - OCT shows interval increase in IRF OD (BRVO w/ CME as above)  - monitor  4,5. Hypertensive retinopathy OU  - discussed importance of tight BP control  - monitor  6. Pseudophakia OU  - s/p CE/IOL OU  - OD: done at [facility] around 2010; OS: Dr. Nancy Fetter (May 2020)  - beautiful surgeries, doing well  - monitor  Ophthalmic Meds Ordered this visit:  Meds ordered this encounter  Medications  . aflibercept (EYLEA) SOLN 2 mg      Return in about 4 weeks (around 03/07/2020) for BRVO w/ CME OD, Dilated exam, OCT.  There are no Patient Instructions on file for this visit.   Explained the diagnoses, plan, and follow up with the patient and they expressed understanding.  Patient expressed  understanding of the importance of proper follow up care.   This document serves as a record of services personally performed by Gardiner Sleeper, MD, PhD. It was created on their behalf by Ernest Mallick, OA, an ophthalmic assistant. The creation of this record is the provider's dictation and/or activities during the visit.    Electronically signed by: Ernest Mallick, OA 04.28.2021 12:18 PM   Gardiner Sleeper, M.D., Ph.D. Diseases & Surgery of the Retina and Vitreous [facility]  I have reviewed the above documentation for accuracy and completeness, and I agree with the above. Gardiner Sleeper, M.D., Ph.D. 02/08/20 12:18 PM   Abbreviations: M myopia (nearsighted); A astigmatism; H hyperopia (farsighted); P presbyopia; Mrx spectacle prescription;  CTL contact lenses; OD right eye; OS left eye; OU both eyes  XT exotropia; ET esotropia; PEK punctate epithelial keratitis; PEE punctate epithelial erosions; DES dry eye syndrome; MGD meibomian gland dysfunction; ATs artificial tears; PFAT's preservative free artificial tears; [place] nuclear sclerotic cataract; PSC posterior subcapsular cataract; ERM epi-retinal membrane; PVD posterior vitreous detachment; RD retinal detachment; DM diabetes mellitus; DR diabetic retinopathy; NPDR non-proliferative diabetic retinopathy; PDR proliferative diabetic retinopathy; CSME clinically significant macular edema; DME diabetic macular edema; dbh dot blot hemorrhages; CWS cotton wool spot; POAG primary open angle glaucoma; C/D cup-to-disc ratio; HVF humphrey visual field; GVF goldmann visual field; OCT optical coherence tomography; IOP intraocular pressure;  BRVO Branch retinal vein occlusion; CRVO central retinal vein occlusion; CRAO central retinal artery occlusion; BRAO branch retinal artery occlusion; RT retinal tear; SB scleral buckle; PPV pars plana vitrectomy; VH Vitreous hemorrhage; PRP panretinal laser photocoagulation; IVK intravitreal kenalog; VMT  vitreomacular traction; MH Macular hole;  NVD neovascularization of the disc; NVE neovascularization elsewhere; AREDS age related eye disease study; ARMD age related macular degeneration; POAG primary open angle glaucoma; EBMD epithelial/anterior basement membrane dystrophy; ACIOL anterior chamber intraocular lens; IOL intraocular lens; PCIOL posterior chamber intraocular lens; Phaco/IOL phacoemulsification with intraocular lens placement; [employer] photorefractive keratectomy; LASIK laser assisted in situ keratomileusis; HTN hypertension; DM diabetes mellitus; COPD chronic obstructive pulmonary disease

## 2020-02-08 ENCOUNTER — Encounter (INDEPENDENT_AMBULATORY_CARE_PROVIDER_SITE_OTHER): Payer: Self-pay | Admitting: Ophthalmology

## 2020-02-08 ENCOUNTER — Ambulatory Visit (INDEPENDENT_AMBULATORY_CARE_PROVIDER_SITE_OTHER): Payer: Medicare Other | Admitting: Ophthalmology

## 2020-02-08 DIAGNOSIS — H3581 Retinal edema: Secondary | ICD-10-CM

## 2020-02-08 DIAGNOSIS — I10 Essential (primary) hypertension: Secondary | ICD-10-CM

## 2020-02-08 DIAGNOSIS — H35033 Hypertensive retinopathy, bilateral: Secondary | ICD-10-CM | POA: Diagnosis not present

## 2020-02-08 DIAGNOSIS — H34831 Tributary (branch) retinal vein occlusion, right eye, with macular edema: Secondary | ICD-10-CM | POA: Diagnosis not present

## 2020-02-08 DIAGNOSIS — Z961 Presence of intraocular lens: Secondary | ICD-10-CM

## 2020-02-08 DIAGNOSIS — E113393 Type 2 diabetes mellitus with moderate nonproliferative diabetic retinopathy without macular edema, bilateral: Secondary | ICD-10-CM

## 2020-02-08 MED ORDER — AFLIBERCEPT 2MG/0.05ML IZ SOLN FOR KALEIDOSCOPE
2.0000 mg | INTRAVITREAL | Status: AC | PRN
  Administered 2020-02-08: 2 mg via INTRAVITREAL

## 2020-02-21 ENCOUNTER — Ambulatory Visit (INDEPENDENT_AMBULATORY_CARE_PROVIDER_SITE_OTHER): Payer: Medicare Other | Admitting: Family Medicine

## 2020-02-21 ENCOUNTER — Ambulatory Visit: Payer: Medicare Other | Admitting: Family Medicine

## 2020-02-21 ENCOUNTER — Other Ambulatory Visit: Payer: Self-pay

## 2020-02-21 ENCOUNTER — Encounter: Payer: Self-pay | Admitting: Family Medicine

## 2020-02-21 VITALS — BP 173/90 | HR 59 | Temp 98.7°F | Ht 67.0 in | Wt 172.0 lb

## 2020-02-21 DIAGNOSIS — E039 Hypothyroidism, unspecified: Secondary | ICD-10-CM

## 2020-02-21 DIAGNOSIS — I152 Hypertension secondary to endocrine disorders: Secondary | ICD-10-CM

## 2020-02-21 DIAGNOSIS — E785 Hyperlipidemia, unspecified: Secondary | ICD-10-CM

## 2020-02-21 DIAGNOSIS — E113511 Type 2 diabetes mellitus with proliferative diabetic retinopathy with macular edema, right eye: Secondary | ICD-10-CM

## 2020-02-21 DIAGNOSIS — I10 Essential (primary) hypertension: Secondary | ICD-10-CM | POA: Diagnosis not present

## 2020-02-21 DIAGNOSIS — E1122 Type 2 diabetes mellitus with diabetic chronic kidney disease: Secondary | ICD-10-CM

## 2020-02-21 DIAGNOSIS — N183 Chronic kidney disease, stage 3 unspecified: Secondary | ICD-10-CM

## 2020-02-21 DIAGNOSIS — E1159 Type 2 diabetes mellitus with other circulatory complications: Secondary | ICD-10-CM

## 2020-02-21 DIAGNOSIS — E118 Type 2 diabetes mellitus with unspecified complications: Secondary | ICD-10-CM | POA: Diagnosis not present

## 2020-02-21 DIAGNOSIS — E1169 Type 2 diabetes mellitus with other specified complication: Secondary | ICD-10-CM

## 2020-02-21 LAB — BAYER DCA HB A1C WAIVED: HB A1C (BAYER DCA - WAIVED): 9.4 % — ABNORMAL HIGH (ref <7.0)

## 2020-02-21 MED ORDER — RYBELSUS 7 MG PO TABS: 7.0000 mg | ORAL_TABLET | Freq: Every day | ORAL | 3 refills | Status: DC

## 2020-02-21 NOTE — Progress Notes (Signed)
BP (!) 173/90   Pulse (!) 59   Temp 98.7 F (37.1 C)   Ht _0  (1.702 m)   Wt 172 lb (78 kg)   SpO2 98%   BMI 26.94 kg/m    Subjective:   Patient ID: Meredith Shaw, female    DOB: 1946-06-30, 74 y.o.   MRN: [id number]  HPI: Meredith Shaw is a 74 y.o. female presenting on 02/21/2020 for Medical Management of Chronic Issues and Diabetes   HPI Type 2 diabetes mellitus Patient comes in today for recheck of his diabetes. Patient has been currently taking metformin and Rybelsus 3 mg. Patient is currently on an ACE inhibitor/ARB. Patient has not seen an ophthalmologist this year. Patient denies any issues with their feet. The symptom started onset as an adult CKD and retinopathy and hyperlipidemia and hypertension ARE RELATED TO DM.  Patient's A1c is 9.4 today  Hypertension Patient is currently on amlodipine 5 mg and lisinopril 20 mg and metoprolol, and their blood pressure today is 173/90, patient swears up and down that her blood sugar runs normal at home in the 130s.  She will bring records when she comes back of her blood pressures.. Patient denies any lightheadedness or dizziness. Patient denies headaches, blurred vision, chest pains, shortness of breath, or weakness. Denies any side effects from medication and is content with current medication.  Hyperlipidemia Patient is coming in for recheck of his hyperlipidemia. The patient is currently taking pravastatin. They deny any issues with myalgias or history of liver damage from it. They deny any focal numbness or weakness or chest pain.   Relevant past medical, surgical, family and social history reviewed and updated as indicated. Interim medical history since our last visit reviewed. Allergies and medications reviewed and updated.  Review of Systems  Constitutional: Negative for fever.  Eyes: Negative for visual disturbance.  Respiratory: Negative for chest tightness and shortness of breath.   Cardiovascular: Negative for  chest pain and leg swelling.  Gastrointestinal: Negative for abdominal pain, diarrhea and nausea.  Musculoskeletal: Negative for back pain and gait problem.  Skin: Negative for rash.  Neurological: Negative for light-headedness and headaches.  Psychiatric/Behavioral: Negative for agitation and behavioral problems.  All other systems reviewed and are negative.   Per HPI unless specifically indicated above   Allergies as of 02/21/2020      Reactions   Penicillins Itching, Rash      Medication List       Accurate as of Feb 21, 2020 10:11 AM. If you have any questions, ask your nurse or doctor.        amLODipine 5 MG tablet Commonly known as: NORVASC TAKE ONE (1) TABLET EACH DAY   aspirin EC 81 MG tablet Take 81 mg by mouth daily.   cholecalciferol 1000 units tablet Commonly known as: VITAMIN D Take 5,000 Units by mouth daily.   denosumab 60 MG/ML Sosy injection Commonly known as: PROLIA Inject 60 mg into the skin every 6 (six) months.   glucose blood test strip Commonly known as: OneTouch Verio Use as instructed   levothyroxine 125 MCG tablet Commonly known as: SYNTHROID Take 1 tablet (125 mcg total) by mouth daily.   lisinopril 20 MG tablet Commonly known as: ZESTRIL Take 1 tablet (20 mg total) by mouth daily.   metFORMIN 500 MG tablet Commonly known as: GLUCOPHAGE Take 1 tablet (500 mg total) by mouth 2 (two) times daily with a meal.   metoprolol tartrate 100 MG tablet Commonly  known as: LOPRESSOR TAKE ONE TABLET BY MOUTH TWICE DAILY   naproxen sodium 220 MG tablet Commonly known as: ALEVE Take 220 mg by mouth 2 (two) times daily as needed (for pain).   onetouch ultrasoft lancets Use as instructed   OneTouch Verio w/Device Kit 1 each by Does not apply route daily.   pravastatin 40 MG tablet Commonly known as: PRAVACHOL Take 1 tablet (40 mg total) by mouth at bedtime.   Rybelsus 7 MG Tabs Generic drug: Semaglutide Take 7 mg by mouth daily. What  changed:   medication strength  how much to take Changed by: Fransisca Kaufmann Marilena Trevathan, MD        Objective:   BP (!) 173/90   Pulse (!) 59   Temp 98.7 F (37.1 C)   Ht _0  (1.702 m)   Wt 172 lb (78 kg)   SpO2 98%   BMI 26.94 kg/m   Wt Readings from Last 3 Encounters:  02/21/20 172 lb (78 kg)  11/15/19 169 lb 4 oz (76.8 kg)  08/15/19 175 lb (79.4 kg)    Physical Exam Vitals and nursing note reviewed.  Constitutional:      General: She is not in acute distress.    Appearance: She is well-developed. She is not diaphoretic.  Eyes:     Conjunctiva/sclera: Conjunctivae normal.  Cardiovascular:     Rate and Rhythm: Normal rate and regular rhythm.     Heart sounds: Normal heart sounds. No murmur.  Pulmonary:     Effort: Pulmonary effort is normal. No respiratory distress.     Breath sounds: Normal breath sounds. No wheezing.  Musculoskeletal:        General: No tenderness. Normal range of motion.  Skin:    General: Skin is warm and dry.     Findings: No rash.  Neurological:     Mental Status: She is alert and oriented to person, place, and time.     Coordination: Coordination normal.  Psychiatric:        Behavior: Behavior normal.       Assessment & Plan:   Problem List Items Addressed This Visit      Cardiovascular and Mediastinum   Hypertension associated with type 2 diabetes mellitus (HCC)   Relevant Medications   aspirin EC 81 MG tablet   Semaglutide (RYBELSUS) 7 MG TABS   Other Relevant Orders   CMP14+EGFR     Endocrine   CKD stage 3 due to type 2 diabetes mellitus (HCC)   Relevant Medications   aspirin EC 81 MG tablet   Semaglutide (RYBELSUS) 7 MG TABS   Type 2 diabetes mellitus with complication, without long-term current use of insulin (HCC) - Primary   Relevant Medications   aspirin EC 81 MG tablet   Semaglutide (RYBELSUS) 7 MG TABS   Other Relevant Orders   Bayer DCA Hb A1c Waived   CMP14+EGFR   Diabetic retinopathy (HCC)   Relevant  Medications   aspirin EC 81 MG tablet   Semaglutide (RYBELSUS) 7 MG TABS   Hyperlipidemia associated with type 2 diabetes mellitus (HCC)   Relevant Medications   aspirin EC 81 MG tablet   Semaglutide (RYBELSUS) 7 MG TABS   Acquired hypothyroidism   Relevant Orders   TSH      A1c is 9.4 today, she will increase her dose to 7 mg, gave sample of the 3 mg tablets and she can take 2 of those until she can get prescription assistance.  Blood pressure elevated,  she will keep a log on it. Follow up plan: Return in about 3 months (around 05/23/2020), or if symptoms worsen or fail to improve, for Recheck with new PCP Mary-Margaret diabetes and hypertension.  Counseling provided for all of the vaccine components Orders Placed This Encounter  Procedures  . Bayer DCA Hb A1c Waived  . CMP14+EGFR  . TSH    Caryl Pina, MD [facility] 02/21/2020, 10:11 AM

## 2020-02-22 ENCOUNTER — Telehealth: Payer: Self-pay | Admitting: Nurse Practitioner

## 2020-02-22 ENCOUNTER — Other Ambulatory Visit: Payer: Self-pay | Admitting: *Deleted

## 2020-02-22 DIAGNOSIS — R7989 Other specified abnormal findings of blood chemistry: Secondary | ICD-10-CM

## 2020-02-22 LAB — CMP14+EGFR
ALT: 11 IU/L (ref 0–32)
AST: 16 IU/L (ref 0–40)
Albumin/Globulin Ratio: 1.6 (ref 1.2–2.2)
Albumin: 4.1 g/dL (ref 3.7–4.7)
Alkaline Phosphatase: 64 IU/L (ref 39–117)
BUN/Creatinine Ratio: 26 (ref 12–28)
BUN: 39 mg/dL — ABNORMAL HIGH (ref 8–27)
Bilirubin Total: <0.2 mg/dL (ref 0.0–1.2)
CO2: 20 mmol/L (ref 20–29)
Calcium: 9.8 mg/dL (ref 8.7–10.3)
Chloride: 104 mmol/L (ref 96–106)
Creatinine, Ser: 1.51 mg/dL — ABNORMAL HIGH (ref 0.57–1.00)
GFR calc Af Amer: 39 mL/min/{1.73_m2} — ABNORMAL LOW (ref >59)
GFR calc non Af Amer: 34 mL/min/{1.73_m2} — ABNORMAL LOW (ref >59)
Globulin, Total: 2.5 g/dL (ref 1.5–4.5)
Glucose: 346 mg/dL — ABNORMAL HIGH (ref 65–99)
Potassium: 4.5 mmol/L (ref 3.5–5.2)
Sodium: 140 mmol/L (ref 134–144)
Total Protein: 6.6 g/dL (ref 6.0–8.5)

## 2020-02-22 LAB — TSH: TSH: 0.693 u[IU]/mL (ref 0.450–4.500)

## 2020-02-22 NOTE — Telephone Encounter (Signed)
Aware of results. 

## 2020-02-24 ENCOUNTER — Encounter: Payer: Self-pay | Admitting: Family Medicine

## 2020-02-25 ENCOUNTER — Telehealth: Payer: Self-pay | Admitting: Pharmacist

## 2020-02-25 NOTE — Telephone Encounter (Signed)
Successful outreach to patient's daughter, [name], and patient's spouse.  Patient recently started increased dose of Rybelsus 7mg  daily on Thursday 5/13.  Patient reports this dose has made patient very sick over the weekend (FRI-SUN).  She reports N/V/D. She is requesting medication switch. Patient to hold medication until f/u PharmD appt tomorrow.  Will discuss GLP1 injectables per patient request (trulicity).

## 2020-02-26 ENCOUNTER — Ambulatory Visit: Payer: Medicare Other | Admitting: Pharmacist

## 2020-02-26 ENCOUNTER — Other Ambulatory Visit: Payer: Self-pay

## 2020-02-26 DIAGNOSIS — E118 Type 2 diabetes mellitus with unspecified complications: Secondary | ICD-10-CM

## 2020-02-26 DIAGNOSIS — R7989 Other specified abnormal findings of blood chemistry: Secondary | ICD-10-CM | POA: Diagnosis not present

## 2020-02-26 MED ORDER — METFORMIN HCL 500 MG PO TABS: 500.0000 mg | ORAL_TABLET | Freq: Two times a day (BID) | ORAL | 2 refills | Status: DC

## 2020-02-26 NOTE — Progress Notes (Signed)
     02/26/2020 [name] MRN: [id number] DOB: 09/30/1946  Referred by: Bennie Pierini, FNP Reason for referral : Diabetes  S:  63 yoF Presents for diabetes evaluation, education, and management accompanied by husband. Patient was referred and last seen by Primary Care Provider on 02/21/20.  Insurance coverage/medication affordability: [employer] medicare   Patient reports adherence with medications.  Current diabetes medications include: Metformin, Rybelsus  Current hypertension medications include: amlodipine, lisinopril, metoprolol  Current hyperlipidemia medications include: pravastatin   Patient denies hypoglycemic events.  Patient-reported exercise habits: n/a  O: Lab Results  Component Value Date   HGBA1C 9.4 (H) 02/21/2020  Lipid Panel     Component Value Date/Time   CHOL 163 11/15/2019 0859   TRIG 286 (H) 11/15/2019 0859   TRIG 456 (H) 07/03/2013 1504   HDL 33 (L) 11/15/2019 0859   HDL 36 (L) 07/03/2013 1504   CHOLHDL 4.9 (H) 11/15/2019 0859   LDLCALC 83 11/15/2019 0859   LDLCALC NOTES: 07/03/2013 1504   Home fasting blood sugars: 150-200  2 hour post-meal/random blood sugars: n/a   A/P:  Diabetes T2DM currently uncontrolled. Patient is able to verbalize appropriate hypoglycemia management plan. Patient is adherent with medication. Control is suboptimal due to medication side effects (Rybelsus made pt n/v x3 days).  -Discontinued GLP-1 RYBELSUS due to N/V, intolerance noted in the EMR  --Started GLP-1 Ozempic (generic name: semglutide)  Instructions: Inject 0.25 mg into the skin weekly for 2-4 weeks, then transition patient to Trulicity 0.75mg  sq weekly.    -Application for patient assistance to be submitted to [employer] for Trulicity  -Would favor Trulicity in this patient based on delivery of auto-inejctor pen and less reported GI side effects  -NO Trulicity sample was available today, therefore Ozempic low dose was started to provide  adequate coverage  -Patient was able to self-inject Ozempic at today's visit, spouse was also involved in her care  -Extensively discussed pathophysiology of diabetes, recommended lifestyle interventions, dietary effects on blood sugar control  -Counseled on s/sx of and management of hypoglycemia  -Next A1C anticipated July 2021.  -Will also attempt to apply for [employer] financial [employer]  Written patient instructions provided.  Total time in face to face counseling 31 minutes.   Follow up Pharmacist TELEPHONE Clinic Visit in 2 WEEKS    Kieth Brightly, PharmD, BCPS Clinical Pharmacist, [facility]  II Phone [phone]

## 2020-02-27 LAB — CMP14+EGFR
ALT: 16 IU/L (ref 0–32)
AST: 17 IU/L (ref 0–40)
Albumin/Globulin Ratio: 1.5 (ref 1.2–2.2)
Albumin: 3.8 g/dL (ref 3.7–4.7)
Alkaline Phosphatase: 72 IU/L (ref 48–121)
BUN/Creatinine Ratio: 25 (ref 12–28)
BUN: 43 mg/dL — ABNORMAL HIGH (ref 8–27)
Bilirubin Total: 0.3 mg/dL (ref 0.0–1.2)
CO2: 20 mmol/L (ref 20–29)
Calcium: 8 mg/dL — ABNORMAL LOW (ref 8.7–10.3)
Chloride: 104 mmol/L (ref 96–106)
Creatinine, Ser: 1.72 mg/dL — ABNORMAL HIGH (ref 0.57–1.00)
GFR calc Af Amer: 34 mL/min/{1.73_m2} — ABNORMAL LOW (ref >59)
GFR calc non Af Amer: 29 mL/min/{1.73_m2} — ABNORMAL LOW (ref >59)
Globulin, Total: 2.5 g/dL (ref 1.5–4.5)
Glucose: 305 mg/dL — ABNORMAL HIGH (ref 65–99)
Potassium: 4.3 mmol/L (ref 3.5–5.2)
Sodium: 137 mmol/L (ref 134–144)
Total Protein: 6.3 g/dL (ref 6.0–8.5)

## 2020-02-28 ENCOUNTER — Other Ambulatory Visit: Payer: Self-pay | Admitting: Family Medicine

## 2020-02-28 DIAGNOSIS — E1122 Type 2 diabetes mellitus with diabetic chronic kidney disease: Secondary | ICD-10-CM

## 2020-03-04 ENCOUNTER — Other Ambulatory Visit: Payer: Self-pay | Admitting: *Deleted

## 2020-03-04 DIAGNOSIS — E1159 Type 2 diabetes mellitus with other circulatory complications: Secondary | ICD-10-CM

## 2020-03-04 MED ORDER — LISINOPRIL 20 MG PO TABS: 20.0000 mg | ORAL_TABLET | Freq: Every day | ORAL | 1 refills | Status: DC

## 2020-03-13 NOTE — Progress Notes (Signed)
[facility] Note  03/14/2020     CHIEF COMPLAINT Patient presents for Retina Follow Up   HISTORY OF PRESENT ILLNESS: Meredith Shaw is a 74 y.o. female who presents to the clinic today for:   HPI    Retina Follow Up    Patient presents with  CRVO/BRVO.  In right eye.  Severity is moderate.  Duration of 5 weeks.  Since onset it is stable.  I, the attending physician,  performed the HPI with the patient and updated documentation appropriately.          Comments    Pt states vision has been "good" since last visit, she denies fol, floaters or eye pain, no gtts       Last edited by Bernarda Caffey, MD on 03/14/2020 12:54 PM. (History)    pt states   Referring physician: Baruch Gouty, FNP No address on file  HISTORICAL INFORMATION:   Selected notes from the [facility] Referred by Dr. Maryjane Hurter for DM exam LEE: 01.16.20 ([facility]. [facility]) [BCVA: OD: 20/40 OS: 20/50-3] Ocular Hx-PDR OU, macular edema OU, cataracts OU PMH-DM (takes metformin), arthritis, HTN, hypothyroidism    CURRENT MEDICATIONS: No current outpatient medications on file. (Ophthalmic Drugs)   No current facility-administered medications for this visit. (Ophthalmic Drugs)   Current Outpatient Medications (Other)  Medication Sig   fexofenadine (ALLEGRA) 180 MG tablet 1 by mouth q day   glimepiride (AMARYL) 1 MG tablet 1 by mouth q day   ibuprofen (ADVIL) 800 MG tablet 1 po bid with food   amLODipine (NORVASC) 5 MG tablet TAKE ONE (1) TABLET EACH DAY   aspirin EC 81 MG tablet Take 81 mg by mouth daily.   Blood Glucose Monitoring Suppl (ONETOUCH VERIO) w/Device KIT 1 each by Does not apply route daily.   cholecalciferol (VITAMIN D) 1000 units tablet Take 5,000 Units by mouth daily.    denosumab (PROLIA) 60 MG/ML SOSY injection Inject 60 mg into the skin every 6 (six) months.   Dulaglutide (TRULICITY) 2.40 XB/3.5HG SOPN Inject 0.75 mg into the skin.   glucose blood  (ONETOUCH VERIO) test strip Use as instructed   Lancets (ONETOUCH ULTRASOFT) lancets Use as instructed   levothyroxine (SYNTHROID) 125 MCG tablet Take 1 tablet (125 mcg total) by mouth daily.   lisinopril (ZESTRIL) 20 MG tablet Take 1 tablet (20 mg total) by mouth daily.   metFORMIN (GLUCOPHAGE) 500 MG tablet Take 1 tablet (500 mg total) by mouth 2 (two) times daily with a meal.   metoprolol tartrate (LOPRESSOR) 100 MG tablet TAKE ONE TABLET BY MOUTH TWICE DAILY   naproxen sodium (ALEVE) 220 MG tablet Take 220 mg by mouth 2 (two) times daily as needed (for pain).   pravastatin (PRAVACHOL) 40 MG tablet Take 1 tablet (40 mg total) by mouth at bedtime.   Semaglutide,0.25 or 0.5MG/DOS, (OZEMPIC, 0.25 OR 0.5 MG/DOSE,) 2 MG/1.5ML SOPN Inject into the skin.   Current Facility-Administered Medications (Other)  Medication Route   Bevacizumab (AVASTIN) SOLN 1.25 mg Intravitreal   denosumab (PROLIA) injection 60 mg Subcutaneous      REVIEW OF SYSTEMS: ROS    Positive for: Endocrine, Cardiovascular, Eyes   Negative for: Constitutional, Gastrointestinal, Neurological, Skin, Genitourinary, Musculoskeletal, HENT, Respiratory, Psychiatric, Allergic/Imm, Heme/Lymph   Last edited by Debbrah Alar, COT on 03/14/2020  9:20 AM. (History)       ALLERGIES Allergies  Allergen Reactions   Semaglutide Nausea And Vomiting  PO Rybelsus   Farxiga [Dapagliflozin]     Yeast infections   Penicillins Itching and Rash    PAST MEDICAL HISTORY Past Medical History:  Diagnosis Date   Arthritis    OA AND PAIN BOTH KNEES -RIGHT KNEE HURTS WORSE THAN LEFT   Cataract    Diabetes mellitus    ORAL MEDICATION - NO INSULIN   Hypertension    Hypertensive retinopathy    OU   Hypothyroidism    Kidney stones    NONE AT PRESENT TIME THAT PT IS AWARE OF   Osteoporosis    Past Surgical History:  Procedure Laterality Date   ABDOMINAL HYSTERECTOMY     CATARACT EXTRACTION      CHOLECYSTECTOMY  1972   COLONOSCOPY     EYE SURGERY     RIGHT CATARACT EXTRACTON WITH LENS IMPLANT   GROWTH REMOVED FROM THYROID     INTRAMEDULLARY (IM) NAIL INTERTROCHANTERIC Left 02/10/2018   Procedure: INTRAMEDULLARY (IM) NAIL INTERTROCHANTRIC;  Surgeon: Mcarthur Rossetti, MD;  Location: [facility];  Service: Orthopedics;  Laterality: Left;   INTRAMEDULLARY (IM) NAIL INTERTROCHANTERIC Right 08/20/2018   Procedure: ORIF RIGHT HIP INTERTROC FRACTURE;  Surgeon: Mcarthur Rossetti, MD;  Location: [facility];  Service: Orthopedics;  Laterality: Right;   left hip surgery   02/2018   ORIF HIP FRACTURE Right 08/20/2018   PERCUTANEOUS NEPHROLITHOTOMY     TOTAL KNEE ARTHROPLASTY  06/09/2012   Procedure: TOTAL KNEE ARTHROPLASTY;  Surgeon: Mcarthur Rossetti, MD;  Location: [facility];  Service: Orthopedics;  Laterality: Right;  Right Total Knee Arthroplasty   TOTAL KNEE ARTHROPLASTY Left 04/09/2014   Procedure: LEFT TOTAL KNEE ARTHROPLASTY;  Surgeon: Mcarthur Rossetti, MD;  Location: [facility];  Service: Orthopedics;  Laterality: Left;   TUBAL LIGATION      FAMILY HISTORY Family History  Problem Relation Age of Onset   Heart disease Mother    Breast cancer Mother        34's   Stroke Father    Heart disease Father    Healthy Daughter    Healthy Son    Liver disease Maternal Grandmother        gall stones imbedded in liver - blead to death when in surgery    Pneumonia Maternal Grandfather    Diabetes Paternal Grandmother     SOCIAL HISTORY Social History   Tobacco Use   Smoking status: Never Smoker   Smokeless tobacco: Never Used  Substance Use Topics   Alcohol use: No   Drug use: No         OPHTHALMIC EXAM:  Base Eye Exam    Visual Acuity (Snellen - Linear)      Right Left   Dist cc 20/25 +2 20/20   Dist ph cc NI    Correction: Glasses       Tonometry (Tonopen, 9:25 AM)      Right Left   Pressure 20 18       Pupils      Dark Light Shape  React APD   Right 4 2 Round Brisk None   Left 4 2 Round Brisk None       Visual Fields (Counting fingers)      Left Right    Full Full       Extraocular Movement      Right Left    Full, Ortho Full, Ortho       Neuro/Psych    Oriented x3: Yes   Mood/Affect: Normal  Dilation    Both eyes: 1.0% Mydriacyl, 2.5% Phenylephrine @ 9:25 AM        Slit Lamp and Fundus Exam    Slit Lamp Exam      Right Left   Lids/Lashes Dermatochalasis - upper lid, Meibomian gland dysfunction Dermatochalasis - upper lid, Meibomian gland dysfunction   Conjunctiva/Sclera White and quiet White and quiet   Cornea 1+ Punctate epithelial erosions, Debris in tear film - improved, +ABMD 1+ Punctate epithelial erosions, Debris in tear film   Anterior Chamber deep and clear deep and clear, narrow temporal angle   Iris Round and dilated, No NVI Round and dilated, No NVI   Lens PC IOL in good position, trace Posterior capsular opacification PC IOL in good position with open PC   Vitreous mild Vitreous syneresis, Posterior vitreous detachment, silicone oil bubble mild Vitreous syneresis       Fundus Exam      Right Left   Disc compact, mild tilt, temporal PPA, mild pallor compact, Pink and Sharp, temporal Peripapillary atrophy   C/D Ratio 0.1 0.3   Macula blunted foveal reflex, persistent edema-mild, improving IRH and exudate superior macula good foveal reflex, Retinal pigment epithelial mottling, No edema, rare MA temporal macula   Vessels Vascular attenuation, Tortuous Vascular attenuation, Tortuous   Periphery Attached, scattered reticular degeneration Attached, reticular degeneration, mild, scattered MA/DBH, focal pigment clumping temporal periphery          IMAGING AND PROCEDURES  Imaging and Procedures for @TODAY @  OCT, Retina - OU - Both Eyes       Right Eye Quality was good. Central Foveal Thickness: 266. Progression has been stable. Findings include no SRF, intraretinal fluid, cystoid  macular edema, normal foveal contour, intraretinal hyper-reflective material (Persistent IRFsuperior macular ).   Left Eye Quality was good. Central Foveal Thickness: 279. Progression has been stable. Findings include normal foveal contour, no IRF, no SRF, vitreomacular adhesion .   Notes *Images captured and stored on drive  Diagnosis / Impression:  OD: superior BRVO w/ CME -- Persistent IRFsuperior macular   OS: NFP; no IRF/SRF--stable  Clinical management:  See below  Abbreviations: NFP - Normal foveal profile. CME - cystoid macular edema. PED - pigment epithelial detachment. IRF - intraretinal fluid. SRF - subretinal fluid. EZ - ellipsoid zone. ERM - epiretinal membrane. ORA - outer retinal atrophy. ORT - outer retinal tubulation. SRHM - subretinal hyper-reflective material        Intravitreal Injection, Pharmacologic Agent - OD - Right Eye       Time Out 03/14/2020. 9:35 AM. Confirmed correct patient, procedure, site, and patient consented.   Anesthesia Topical anesthesia was used. Anesthetic medications included Lidocaine 2%, Proparacaine 0.5%.   Procedure Preparation included 5% betadine to ocular surface, eyelid speculum. A (32g) needle was used.   Injection:  2 mg aflibercept Alfonse Flavors) SOLN   NDC: A3590391, Lot: [id number], Expiration date: 07/07/2020   Route: Intravitreal, Site: Right Eye, Waste: 0.05 mL  Post-op Post injection exam found visual acuity of at least counting fingers. The patient tolerated the procedure well. There were no complications. The patient received written and verbal post procedure care education.                 ASSESSMENT/PLAN:    ICD-10-CM   1. Branch retinal vein occlusion of right eye with macular edema  H34.8310 Intravitreal Injection, Pharmacologic Agent - OD - Right Eye    aflibercept (EYLEA) SOLN 2 mg  2. Retinal edema  H35.81  OCT, Retina - OU - Both Eyes  3. 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 Essential hypertension  I10   5. Hypertensive retinopathy of both eyes  H35.033   6. Pseudophakia of both eyes  Z96.1     1,2. BRVO w/ CME OD - s/p IVA OD #1 (01.31.20), #2 (02.28.20), #3 (05.08.20), #4 (06.05.20), #5 (07.03.20), #6 (07.31.20), # 7(08.28.20), #8 (10.02.20), #9 (10.30.20), #10 (11.30.20), #11 (01.07.21), #12 (02.02.21)  - discussed possible resistance to Avastin, very slow progress, and possibility of switching medication  - s/p IVE OD #1 (03.05.21), #2 (04.02.21),#3 (04.30.21) - FA (06.05.20) shows focal area of vascular non-perfusion and telangiectatic vessels superior macula -- consistent with BRVO - BCVA 20/25 OD -- stable - OCT shows persistent central cyst and superior IRF - recommend IVE OD #4today, 06.04.21 w/ f/u in 4-5 wks - RBA of procedure discussed, questions answered - see procedure note  - Eylea4U benefits investigation started 01.07.21 -- approved for 2021  - informed consent for IVA OD signed and scanned on 01.08.21  - Eylea informed consent form signed and scanned on 03.05.21 (OD) - F/U 4 weeks -- DFE/OCT/possible injectoin  3. Mild to moderate nonproliferative diabetic retinopathy OU  - exam with rare, scattered IRH/MA OU  - FA (06.05.20) shows leaking microaneurysms OU; no NV OU  - OCT shows interval increase in IRF OD (BRVO w/ CME as above)  - monitor  4,5. Hypertensive retinopathy OU  - discussed importance of tight BP control  - monitor  6. Pseudophakia OU  - s/p CE/IOL OU  - OD: done at [facility] around 2010; OS: Dr. Nancy Fetter (May 2020)  - beautiful surgeries, doing well  - monitor  Ophthalmic Meds Ordered this visit:  Meds ordered this encounter  Medications   aflibercept (EYLEA) SOLN 2 mg      Return for f/u 4-5 weeks, BRVO OD, DFE, OCT.  There are no  Patient Instructions on file for this visit.  This document serves as a record of services personally performed by Gardiner Sleeper, MD, PhD. It was created on their behalf by Ernest Mallick, OA, an ophthalmic assistant. The creation of this record is the provider's dictation and/or activities during the visit.    Electronically signed by: Ernest Mallick, OA 06.03.2021 12:55 PM  Gardiner Sleeper, M.D., Ph.D. Diseases & Surgery of the Retina and Vitreous [facility]  I have reviewed the above documentation for accuracy and completeness, and I agree with the above. Gardiner Sleeper, M.D., Ph.D. 03/14/20 12:55 PM   Abbreviations: M myopia (nearsighted); A astigmatism; H hyperopia (farsighted); P presbyopia; Mrx spectacle prescription;  CTL contact lenses; OD right eye; OS left eye; OU both eyes  XT exotropia; ET esotropia; PEK punctate epithelial keratitis; PEE punctate epithelial erosions; DES dry eye syndrome; MGD meibomian gland dysfunction; ATs artificial tears; PFAT's preservative free artificial tears; [place] nuclear sclerotic cataract; PSC posterior subcapsular cataract; ERM epi-retinal membrane; PVD posterior vitreous detachment; RD retinal detachment; DM diabetes mellitus; DR diabetic retinopathy; NPDR non-proliferative diabetic retinopathy; PDR proliferative diabetic retinopathy; CSME clinically significant macular edema; DME diabetic macular edema; dbh dot blot hemorrhages; CWS cotton wool spot; POAG primary open angle glaucoma; C/D cup-to-disc ratio; HVF humphrey visual field; GVF goldmann visual field; OCT optical coherence tomography; IOP intraocular pressure; BRVO Branch retinal vein occlusion; CRVO central retinal vein occlusion; CRAO central retinal artery occlusion; BRAO  branch retinal artery occlusion; RT retinal tear; SB scleral buckle; PPV pars plana vitrectomy; VH Vitreous hemorrhage; PRP panretinal laser photocoagulation; IVK intravitreal kenalog; VMT vitreomacular  traction; MH Macular hole;  NVD neovascularization of the disc; NVE neovascularization elsewhere; AREDS age related eye disease study; ARMD age related macular degeneration; POAG primary open angle glaucoma; EBMD epithelial/anterior basement membrane dystrophy; ACIOL anterior chamber intraocular lens; IOL intraocular lens; PCIOL posterior chamber intraocular lens; Phaco/IOL phacoemulsification with intraocular lens placement; [place] photorefractive keratectomy; LASIK laser assisted in situ keratomileusis; HTN hypertension; DM diabetes mellitus; COPD chronic obstructive pulmonary disease

## 2020-03-14 ENCOUNTER — Ambulatory Visit (INDEPENDENT_AMBULATORY_CARE_PROVIDER_SITE_OTHER): Payer: Medicare Other | Admitting: Ophthalmology

## 2020-03-14 ENCOUNTER — Other Ambulatory Visit: Payer: Self-pay

## 2020-03-14 ENCOUNTER — Encounter (INDEPENDENT_AMBULATORY_CARE_PROVIDER_SITE_OTHER): Payer: Self-pay | Admitting: Ophthalmology

## 2020-03-14 DIAGNOSIS — E113393 Type 2 diabetes mellitus with moderate nonproliferative diabetic retinopathy without macular edema, bilateral: Secondary | ICD-10-CM | POA: Diagnosis not present

## 2020-03-14 DIAGNOSIS — H34831 Tributary (branch) retinal vein occlusion, right eye, with macular edema: Secondary | ICD-10-CM | POA: Diagnosis not present

## 2020-03-14 DIAGNOSIS — Z961 Presence of intraocular lens: Secondary | ICD-10-CM

## 2020-03-14 DIAGNOSIS — I10 Essential (primary) hypertension: Secondary | ICD-10-CM

## 2020-03-14 DIAGNOSIS — H3581 Retinal edema: Secondary | ICD-10-CM | POA: Diagnosis not present

## 2020-03-14 DIAGNOSIS — H35033 Hypertensive retinopathy, bilateral: Secondary | ICD-10-CM | POA: Diagnosis not present

## 2020-03-14 MED ORDER — AFLIBERCEPT 2MG/0.05ML IZ SOLN FOR KALEIDOSCOPE
2.0000 mg | INTRAVITREAL | Status: AC | PRN
  Administered 2020-03-14: 2 mg via INTRAVITREAL

## 2020-03-23 ENCOUNTER — Emergency Department (HOSPITAL_COMMUNITY): Payer: Medicare Other

## 2020-03-23 ENCOUNTER — Encounter (HOSPITAL_COMMUNITY)
Admission: EM | Disposition: A | Discharge status: Rehabilitation Facility | Payer: Self-pay | Source: Home / Self Care | Attending: Neurology

## 2020-03-23 ENCOUNTER — Inpatient Hospital Stay (HOSPITAL_COMMUNITY)
Admission: EM | Admit: 2020-03-23 | Discharge: 2020-04-11 | DRG: 023 | Disposition: A | Discharge status: Rehabilitation Facility | Payer: Medicare Other | Attending: Neurology | Admitting: Neurology

## 2020-03-23 ENCOUNTER — Inpatient Hospital Stay (HOSPITAL_COMMUNITY): Payer: Medicare Other | Admitting: Anesthesiology

## 2020-03-23 DIAGNOSIS — T17908A Unspecified foreign body in respiratory tract, part unspecified causing other injury, initial encounter: Secondary | ICD-10-CM | POA: Diagnosis not present

## 2020-03-23 DIAGNOSIS — R319 Hematuria, unspecified: Secondary | ICD-10-CM | POA: Diagnosis not present

## 2020-03-23 DIAGNOSIS — I69391 Dysphagia following cerebral infarction: Secondary | ICD-10-CM | POA: Diagnosis not present

## 2020-03-23 DIAGNOSIS — I340 Nonrheumatic mitral (valve) insufficiency: Secondary | ICD-10-CM | POA: Diagnosis not present

## 2020-03-23 DIAGNOSIS — E785 Hyperlipidemia, unspecified: Secondary | ICD-10-CM

## 2020-03-23 DIAGNOSIS — Z833 Family history of diabetes mellitus: Secondary | ICD-10-CM

## 2020-03-23 DIAGNOSIS — Z803 Family history of malignant neoplasm of breast: Secondary | ICD-10-CM

## 2020-03-23 DIAGNOSIS — R414 Neurologic neglect syndrome: Secondary | ICD-10-CM | POA: Diagnosis not present

## 2020-03-23 DIAGNOSIS — Z7982 Long term (current) use of aspirin: Secondary | ICD-10-CM

## 2020-03-23 DIAGNOSIS — Z823 Family history of stroke: Secondary | ICD-10-CM

## 2020-03-23 DIAGNOSIS — R001 Bradycardia, unspecified: Secondary | ICD-10-CM | POA: Diagnosis not present

## 2020-03-23 DIAGNOSIS — R259 Unspecified abnormal involuntary movements: Secondary | ICD-10-CM | POA: Diagnosis not present

## 2020-03-23 DIAGNOSIS — Z961 Presence of intraocular lens: Secondary | ICD-10-CM

## 2020-03-23 DIAGNOSIS — N182 Chronic kidney disease, stage 2 (mild): Secondary | ICD-10-CM | POA: Diagnosis present

## 2020-03-23 DIAGNOSIS — K5901 Slow transit constipation: Secondary | ICD-10-CM

## 2020-03-23 DIAGNOSIS — N390 Urinary tract infection, site not specified: Secondary | ICD-10-CM | POA: Diagnosis not present

## 2020-03-23 DIAGNOSIS — R1312 Dysphagia, oropharyngeal phase: Secondary | ICD-10-CM | POA: Diagnosis not present

## 2020-03-23 DIAGNOSIS — I639 Cerebral infarction, unspecified: Secondary | ICD-10-CM | POA: Diagnosis not present

## 2020-03-23 DIAGNOSIS — M810 Age-related osteoporosis without current pathological fracture: Secondary | ICD-10-CM | POA: Diagnosis present

## 2020-03-23 DIAGNOSIS — R29810 Facial weakness: Secondary | ICD-10-CM | POA: Diagnosis present

## 2020-03-23 DIAGNOSIS — E11311 Type 2 diabetes mellitus with unspecified diabetic retinopathy with macular edema: Secondary | ICD-10-CM | POA: Diagnosis present

## 2020-03-23 DIAGNOSIS — I6601 Occlusion and stenosis of right middle cerebral artery: Secondary | ICD-10-CM | POA: Diagnosis not present

## 2020-03-23 DIAGNOSIS — Z888 Allergy status to other drugs, medicaments and biological substances status: Secondary | ICD-10-CM

## 2020-03-23 DIAGNOSIS — N3091 Cystitis, unspecified with hematuria: Secondary | ICD-10-CM

## 2020-03-23 DIAGNOSIS — I63511 Cerebral infarction due to unspecified occlusion or stenosis of right middle cerebral artery: Secondary | ICD-10-CM | POA: Diagnosis present

## 2020-03-23 DIAGNOSIS — N183 Chronic kidney disease, stage 3 unspecified: Secondary | ICD-10-CM | POA: Diagnosis not present

## 2020-03-23 DIAGNOSIS — E1165 Type 2 diabetes mellitus with hyperglycemia: Secondary | ICD-10-CM | POA: Diagnosis present

## 2020-03-23 DIAGNOSIS — G936 Cerebral edema: Secondary | ICD-10-CM | POA: Diagnosis not present

## 2020-03-23 DIAGNOSIS — E039 Hypothyroidism, unspecified: Secondary | ICD-10-CM | POA: Diagnosis not present

## 2020-03-23 DIAGNOSIS — Z978 Presence of other specified devices: Secondary | ICD-10-CM

## 2020-03-23 DIAGNOSIS — I63411 Cerebral infarction due to embolism of right middle cerebral artery: Principal | ICD-10-CM | POA: Diagnosis present

## 2020-03-23 DIAGNOSIS — Z880 Allergy status to penicillin: Secondary | ICD-10-CM

## 2020-03-23 DIAGNOSIS — Z8616 Personal history of COVID-19: Secondary | ICD-10-CM | POA: Diagnosis not present

## 2020-03-23 DIAGNOSIS — J9600 Acute respiratory failure, unspecified whether with hypoxia or hypercapnia: Secondary | ICD-10-CM | POA: Diagnosis not present

## 2020-03-23 DIAGNOSIS — N136 Pyonephrosis: Secondary | ICD-10-CM | POA: Diagnosis present

## 2020-03-23 DIAGNOSIS — G9341 Metabolic encephalopathy: Secondary | ICD-10-CM | POA: Diagnosis not present

## 2020-03-23 DIAGNOSIS — R29818 Other symptoms and signs involving the nervous system: Secondary | ICD-10-CM | POA: Diagnosis not present

## 2020-03-23 DIAGNOSIS — I10 Essential (primary) hypertension: Secondary | ICD-10-CM

## 2020-03-23 DIAGNOSIS — D72829 Elevated white blood cell count, unspecified: Secondary | ICD-10-CM | POA: Diagnosis not present

## 2020-03-23 DIAGNOSIS — Z20822 Contact with and (suspected) exposure to covid-19: Secondary | ICD-10-CM | POA: Diagnosis not present

## 2020-03-23 DIAGNOSIS — R29705 NIHSS score 5: Secondary | ICD-10-CM | POA: Diagnosis not present

## 2020-03-23 DIAGNOSIS — N179 Acute kidney failure, unspecified: Secondary | ICD-10-CM | POA: Diagnosis not present

## 2020-03-23 DIAGNOSIS — I152 Hypertension secondary to endocrine disorders: Secondary | ICD-10-CM | POA: Diagnosis present

## 2020-03-23 DIAGNOSIS — R7989 Other specified abnormal findings of blood chemistry: Secondary | ICD-10-CM | POA: Diagnosis not present

## 2020-03-23 DIAGNOSIS — Z87442 Personal history of urinary calculi: Secondary | ICD-10-CM

## 2020-03-23 DIAGNOSIS — R1110 Vomiting, unspecified: Secondary | ICD-10-CM

## 2020-03-23 DIAGNOSIS — R270 Ataxia, unspecified: Secondary | ICD-10-CM | POA: Diagnosis present

## 2020-03-23 DIAGNOSIS — J9601 Acute respiratory failure with hypoxia: Secondary | ICD-10-CM | POA: Diagnosis not present

## 2020-03-23 DIAGNOSIS — I129 Hypertensive chronic kidney disease with stage 1 through stage 4 chronic kidney disease, or unspecified chronic kidney disease: Secondary | ICD-10-CM | POA: Diagnosis present

## 2020-03-23 DIAGNOSIS — A499 Bacterial infection, unspecified: Secondary | ICD-10-CM | POA: Diagnosis not present

## 2020-03-23 DIAGNOSIS — E7800 Pure hypercholesterolemia, unspecified: Secondary | ICD-10-CM | POA: Diagnosis not present

## 2020-03-23 DIAGNOSIS — I058 Other rheumatic mitral valve diseases: Secondary | ICD-10-CM | POA: Diagnosis not present

## 2020-03-23 DIAGNOSIS — H35039 Hypertensive retinopathy, unspecified eye: Secondary | ICD-10-CM | POA: Diagnosis present

## 2020-03-23 DIAGNOSIS — B9620 Unspecified Escherichia coli [E. coli] as the cause of diseases classified elsewhere: Secondary | ICD-10-CM | POA: Diagnosis present

## 2020-03-23 DIAGNOSIS — Z79899 Other long term (current) drug therapy: Secondary | ICD-10-CM

## 2020-03-23 DIAGNOSIS — B961 Klebsiella pneumoniae [K. pneumoniae] as the cause of diseases classified elsewhere: Secondary | ICD-10-CM | POA: Diagnosis present

## 2020-03-23 DIAGNOSIS — N1330 Unspecified hydronephrosis: Secondary | ICD-10-CM | POA: Diagnosis not present

## 2020-03-23 DIAGNOSIS — A419 Sepsis, unspecified organism: Secondary | ICD-10-CM | POA: Diagnosis not present

## 2020-03-23 DIAGNOSIS — R471 Dysarthria and anarthria: Secondary | ICD-10-CM | POA: Diagnosis present

## 2020-03-23 DIAGNOSIS — E876 Hypokalemia: Secondary | ICD-10-CM | POA: Diagnosis not present

## 2020-03-23 DIAGNOSIS — E118 Type 2 diabetes mellitus with unspecified complications: Secondary | ICD-10-CM | POA: Diagnosis not present

## 2020-03-23 DIAGNOSIS — E1142 Type 2 diabetes mellitus with diabetic polyneuropathy: Secondary | ICD-10-CM | POA: Diagnosis present

## 2020-03-23 DIAGNOSIS — G8194 Hemiplegia, unspecified affecting left nondominant side: Secondary | ICD-10-CM | POA: Diagnosis present

## 2020-03-23 DIAGNOSIS — R000 Tachycardia, unspecified: Secondary | ICD-10-CM | POA: Diagnosis not present

## 2020-03-23 DIAGNOSIS — J9602 Acute respiratory failure with hypercapnia: Secondary | ICD-10-CM | POA: Diagnosis not present

## 2020-03-23 DIAGNOSIS — E1169 Type 2 diabetes mellitus with other specified complication: Secondary | ICD-10-CM | POA: Diagnosis not present

## 2020-03-23 DIAGNOSIS — E1159 Type 2 diabetes mellitus with other circulatory complications: Secondary | ICD-10-CM | POA: Diagnosis present

## 2020-03-23 DIAGNOSIS — Z79890 Hormone replacement therapy (postmenopausal): Secondary | ICD-10-CM

## 2020-03-23 DIAGNOSIS — Z7984 Long term (current) use of oral hypoglycemic drugs: Secondary | ICD-10-CM

## 2020-03-23 DIAGNOSIS — R509 Fever, unspecified: Secondary | ICD-10-CM | POA: Diagnosis not present

## 2020-03-23 DIAGNOSIS — N3001 Acute cystitis with hematuria: Secondary | ICD-10-CM | POA: Diagnosis not present

## 2020-03-23 DIAGNOSIS — Z9841 Cataract extraction status, right eye: Secondary | ICD-10-CM

## 2020-03-23 DIAGNOSIS — E1122 Type 2 diabetes mellitus with diabetic chronic kidney disease: Secondary | ICD-10-CM | POA: Diagnosis not present

## 2020-03-23 DIAGNOSIS — I6389 Other cerebral infarction: Secondary | ICD-10-CM | POA: Diagnosis not present

## 2020-03-23 DIAGNOSIS — Z8249 Family history of ischemic heart disease and other diseases of the circulatory system: Secondary | ICD-10-CM

## 2020-03-23 DIAGNOSIS — R310 Gross hematuria: Secondary | ICD-10-CM | POA: Diagnosis not present

## 2020-03-23 DIAGNOSIS — Z791 Long term (current) use of non-steroidal anti-inflammatories (NSAID): Secondary | ICD-10-CM

## 2020-03-23 DIAGNOSIS — Z96653 Presence of artificial knee joint, bilateral: Secondary | ICD-10-CM

## 2020-03-23 DIAGNOSIS — R4701 Aphasia: Secondary | ICD-10-CM | POA: Diagnosis not present

## 2020-03-23 DIAGNOSIS — J9690 Respiratory failure, unspecified, unspecified whether with hypoxia or hypercapnia: Secondary | ICD-10-CM

## 2020-03-23 DIAGNOSIS — R531 Weakness: Secondary | ICD-10-CM | POA: Diagnosis not present

## 2020-03-23 HISTORY — PX: RADIOLOGY WITH ANESTHESIA: SHX6223

## 2020-03-23 LAB — I-STAT CHEM 8, ED
BUN: 39 mg/dL — ABNORMAL HIGH (ref 8–23)
Calcium, Ion: 1.14 mmol/L — ABNORMAL LOW (ref 1.15–1.40)
Chloride: 105 mmol/L (ref 98–111)
Creatinine, Ser: 1.6 mg/dL — ABNORMAL HIGH (ref 0.44–1.00)
Glucose, Bld: 302 mg/dL — ABNORMAL HIGH (ref 70–99)
HCT: 38 % (ref 36.0–46.0)
Hemoglobin: 12.9 g/dL (ref 12.0–15.0)
Potassium: 4.2 mmol/L (ref 3.5–5.1)
Sodium: 142 mmol/L (ref 135–145)
TCO2: 21 mmol/L — ABNORMAL LOW (ref 22–32)

## 2020-03-23 LAB — COMPREHENSIVE METABOLIC PANEL
ALT: 15 U/L (ref 0–44)
AST: 15 U/L (ref 15–41)
Albumin: 3.4 g/dL — ABNORMAL LOW (ref 3.5–5.0)
Alkaline Phosphatase: 54 U/L (ref 38–126)
Anion gap: 13 (ref 5–15)
BUN: 41 mg/dL — ABNORMAL HIGH (ref 8–23)
CO2: 21 mmol/L — ABNORMAL LOW (ref 22–32)
Calcium: 9.3 mg/dL (ref 8.9–10.3)
Chloride: 104 mmol/L (ref 98–111)
Creatinine, Ser: 1.7 mg/dL — ABNORMAL HIGH (ref 0.44–1.00)
GFR calc Af Amer: 34 mL/min — ABNORMAL LOW (ref >60)
GFR calc non Af Amer: 29 mL/min — ABNORMAL LOW (ref >60)
Glucose, Bld: 316 mg/dL — ABNORMAL HIGH (ref 70–99)
Potassium: 4.2 mmol/L (ref 3.5–5.1)
Sodium: 138 mmol/L (ref 135–145)
Total Bilirubin: 0.9 mg/dL (ref 0.3–1.2)
Total Protein: 6.9 g/dL (ref 6.5–8.1)

## 2020-03-23 LAB — PROTIME-INR
INR: 1 (ref 0.8–1.2)
Prothrombin Time: 12.9 seconds (ref 11.4–15.2)

## 2020-03-23 LAB — DIFFERENTIAL
Abs Immature Granulocytes: 0.03 10*3/uL (ref 0.00–0.07)
Basophils Absolute: 0.1 10*3/uL (ref 0.0–0.1)
Basophils Relative: 1 %
Eosinophils Absolute: 0.3 10*3/uL (ref 0.0–0.5)
Eosinophils Relative: 3 %
Immature Granulocytes: 0 %
Lymphocytes Relative: 20 %
Lymphs Abs: 1.9 10*3/uL (ref 0.7–4.0)
Monocytes Absolute: 0.6 10*3/uL (ref 0.1–1.0)
Monocytes Relative: 7 %
Neutro Abs: 6.5 10*3/uL (ref 1.7–7.7)
Neutrophils Relative %: 69 %

## 2020-03-23 LAB — CBC
HCT: 40.8 % (ref 36.0–46.0)
Hemoglobin: 13.4 g/dL (ref 12.0–15.0)
MCH: 29.5 pg (ref 26.0–34.0)
MCHC: 32.8 g/dL (ref 30.0–36.0)
MCV: 89.7 fL (ref 80.0–100.0)
Platelets: 222 10*3/uL (ref 150–400)
RBC: 4.55 MIL/uL (ref 3.87–5.11)
RDW: 13.6 % (ref 11.5–15.5)
WBC: 9.4 10*3/uL (ref 4.0–10.5)
nRBC: 0 % (ref 0.0–0.2)

## 2020-03-23 LAB — APTT: aPTT: 27 seconds (ref 24–36)

## 2020-03-23 SURGERY — IR WITH ANESTHESIA
Anesthesia: General

## 2020-03-23 MED ORDER — CLEVIDIPINE BUTYRATE 0.5 MG/ML IV EMUL
INTRAVENOUS | Status: AC
  Filled 2020-03-23: qty 50

## 2020-03-23 MED ORDER — LABETALOL HCL 5 MG/ML IV SOLN
INTRAVENOUS | Status: AC
  Filled 2020-03-23: qty 4

## 2020-03-23 MED ORDER — IOHEXOL 350 MG/ML SOLN
80.0000 mL | Freq: Once | INTRAVENOUS | Status: AC | PRN
  Administered 2020-03-23: 80 mL via INTRAVENOUS

## 2020-03-23 MED ORDER — ALTEPLASE (STROKE) FULL DOSE INFUSION
0.9000 mg/kg | Freq: Once | INTRAVENOUS | Status: AC
  Administered 2020-03-23: 62.4 mg via INTRAVENOUS
  Filled 2020-03-23: qty 100

## 2020-03-23 MED ORDER — SODIUM CHLORIDE 0.9% FLUSH
3.0000 mL | Freq: Once | INTRAVENOUS | Status: AC
Start: 2020-03-23 — End: 2020-03-24
  Administered 2020-03-24: 3 mL via INTRAVENOUS

## 2020-03-23 MED ORDER — SODIUM CHLORIDE 0.9 % IV SOLN
50.0000 mL | Freq: Once | INTRAVENOUS | Status: AC
  Administered 2020-03-24: 50 mL via INTRAVENOUS

## 2020-03-23 NOTE — ED Notes (Signed)
Onset 8:50p pts husband called son to say pt has facial droop, slurred speech, left arm weakness, and words did not make any since.  Slurred speech has improved.

## 2020-03-23 NOTE — ED Notes (Signed)
Receiving pt in CT from triage

## 2020-03-23 NOTE — Anesthesia Preprocedure Evaluation (Addendum)
Anesthesia Evaluation  Patient identified by MRN, date of birth, ID band Patient confused    Reviewed: Allergy & Precautions, H&P , NPO status , Patient's Chart, lab work & pertinent test results  Airway Mallampati: III  TM Distance: >3 FB Neck ROM: Full    Dental no notable dental hx. (+) Edentulous Upper, Edentulous Lower, Dental Advisory Given   Pulmonary neg pulmonary ROS,    Pulmonary exam normal breath sounds clear to auscultation       Cardiovascular hypertension, Pt. on medications  Rhythm:Regular Rate:Normal     Neuro/Psych CVA, Residual Symptoms negative psych ROS   GI/Hepatic negative GI ROS, Neg liver ROS,   Endo/Other  diabetes, Type 2, Oral Hypoglycemic AgentsHypothyroidism   Renal/GU negative Renal ROS  negative genitourinary   Musculoskeletal  (+) Arthritis , Osteoarthritis,    Abdominal   Peds  Hematology negative hematology ROS (+)   Anesthesia Other Findings   Reproductive/Obstetrics negative OB ROS                            Anesthesia Physical Anesthesia Plan  ASA: III and emergent  Anesthesia Plan: General   Post-op Pain Management:    Induction: Intravenous, Rapid sequence and Cricoid pressure planned  PONV Risk Score and Plan: 3 and Ondansetron and Treatment may vary due to age or medical condition  Airway Management Planned: Oral ETT  Additional Equipment: Arterial line  Intra-op Plan:   Post-operative Plan: Possible Post-op intubation/ventilation and Extubation in OR  Informed Consent: I have reviewed the patients History and Physical, chart, labs and discussed the procedure including the risks, benefits and alternatives for the proposed anesthesia with the patient or authorized representative who has indicated his/her understanding and acceptance.     Dental advisory given  Plan Discussed with: CRNA  Anesthesia Plan Comments:         Anesthesia Quick Evaluation

## 2020-03-23 NOTE — ED Notes (Signed)
TPA bolus 6.9 followed by 62.4 infusion

## 2020-03-23 NOTE — ED Provider Notes (Signed)
[facility] EMERGENCY DEPARTMENT Provider Note   CSN: [id number] Arrival date & time: 03/23/20  2207     History Chief Complaint  Patient presents with  . Code Stroke    Meredith Shaw is a 74 y.o. female.  The history is provided by the patient and medical records.    74 y.o. F with hx of arthritis, DM, HTN, hypothyroidism, osteoporosis, presenting to the ED for a code stroke.  Husband called son tonight at 8:50PM and reported patient had slurred speech, left arm weakness, and left facial droop.  She had been fine earlier in the day.  Patient denies any history of stroke in the past.  States prior to today she has been doing well, no real medical issues aside from hematuria.  She does take daily ASA, no other anticoagulation.  Past Medical History:  Diagnosis Date  . Arthritis    OA AND PAIN BOTH KNEES -RIGHT KNEE HURTS WORSE THAN LEFT  . Cataract   . Diabetes mellitus    ORAL MEDICATION - NO INSULIN  . Hypertension   . Hypertensive retinopathy    OU  . Hypothyroidism   . Kidney stones    NONE AT PRESENT TIME THAT PT IS AWARE OF  . Osteoporosis     Patient Active Problem List   Diagnosis Date Noted  . Acquired hypothyroidism 08/15/2019  . Hyperlipidemia associated with type 2 diabetes mellitus ([place]) 05/14/2019  . Diabetic retinopathy ([place]) 11/09/2018  . Closed fracture of femur, intertrochanteric, right, initial encounter ([place]) 08/19/2018  . Type 2 diabetes mellitus with complication, without long-term current use of insulin ([place]) 02/15/2018  . Closed left hip fracture ([place]) 02/10/2018  . CKD stage 3 due to type 2 diabetes mellitus ([place]) 02/10/2018  . Hip fracture ([place]) 02/10/2018  . Pressure injury of skin 02/10/2018  . Arthritis of left knee 04/09/2014  . Status post total knee replacement 04/09/2014  . Arthropathy   . Hypertension associated with type 2 diabetes mellitus ([place])   . Kidney stones   . Postablative hypothyroidism   .  Degenerative arthritis of knee 06/09/2012  . Flatulence, eructation, and gas pain 12/24/2005  . Abdominal or pelvic swelling, mass, or lump, other specified site 12/24/2005  . Age-related osteoporosis with current pathological fracture with routine healing 12/04/2004  . Symptomatic menopausal or female climacteric states 12/04/2004  . Arthropathy, lower leg 04/21/2004  . Hematuria 09/17/2003  . Other long term (current) drug therapy 07/05/2002    Past Surgical History:  Procedure Laterality Date  . ABDOMINAL HYSTERECTOMY    . CATARACT EXTRACTION    . CHOLECYSTECTOMY  1972  . COLONOSCOPY    . EYE SURGERY     RIGHT CATARACT EXTRACTON WITH LENS IMPLANT  . GROWTH REMOVED FROM THYROID    . INTRAMEDULLARY (IM) NAIL INTERTROCHANTERIC Left 02/10/2018   Procedure: INTRAMEDULLARY (IM) NAIL INTERTROCHANTRIC;  Surgeon: Mcarthur Rossetti, MD;  Location: [facility];  Service: Orthopedics;  Laterality: Left;  . INTRAMEDULLARY (IM) NAIL INTERTROCHANTERIC Right 08/20/2018   Procedure: ORIF RIGHT HIP INTERTROC FRACTURE;  Surgeon: Mcarthur Rossetti, MD;  Location: [facility];  Service: Orthopedics;  Laterality: Right;  . left hip surgery   02/2018  . ORIF HIP FRACTURE Right 08/20/2018  . PERCUTANEOUS NEPHROLITHOTOMY    . TOTAL KNEE ARTHROPLASTY  06/09/2012   Procedure: TOTAL KNEE ARTHROPLASTY;  Surgeon: Mcarthur Rossetti, MD;  Location: [facility];  Service: Orthopedics;  Laterality: Right;  Right Total Knee Arthroplasty  . TOTAL KNEE  ARTHROPLASTY Left 04/09/2014   Procedure: LEFT TOTAL KNEE ARTHROPLASTY;  Surgeon: Mcarthur Rossetti, MD;  Location: [facility];  Service: Orthopedics;  Laterality: Left;  . TUBAL LIGATION       OB History   No obstetric history on file.     Family History  Problem Relation Age of Onset  . Heart disease Mother   . Breast cancer Mother        81's  . Stroke Father   . Heart disease Father   . Healthy Daughter   . Healthy Son   . Liver disease Maternal  Grandmother        gall stones imbedded in liver - blead to death when in surgery   . Pneumonia Maternal Grandfather   . Diabetes Paternal Grandmother     Social History   Tobacco Use  . Smoking status: Never Smoker  . Smokeless tobacco: Never Used  Vaping Use  . Vaping Use: Never used  Substance Use Topics  . Alcohol use: No  . Drug use: No    Home Medications Prior to Admission medications   Medication Sig Start Date End Date Taking? Authorizing Provider  amLODipine (NORVASC) 5 MG tablet TAKE ONE (1) TABLET EACH DAY 01/09/20   Baruch Gouty, FNP  aspirin EC 81 MG tablet Take 81 mg by mouth daily.    [provider]  Blood Glucose Monitoring Suppl (ONETOUCH VERIO) w/Device KIT 1 each by Does not apply route daily. 03/10/18   Eustaquio Maize, MD  cholecalciferol (VITAMIN D) 1000 units tablet Take 5,000 Units by mouth daily.     [provider]  denosumab (PROLIA) 60 MG/ML SOSY injection Inject 60 mg into the skin every 6 (six) months. 11/15/18   Baruch Gouty, FNP  Dulaglutide (TRULICITY) 8.91 QX/4.5WT SOPN Inject 0.75 mg into the skin.    [provider]  fexofenadine (ALLEGRA) 180 MG tablet 1 by mouth q day 12/27/05   [provider]  glimepiride (AMARYL) 1 MG tablet 1 by mouth q day 12/27/05   [provider]  glucose blood (ONETOUCH VERIO) test strip Use as instructed 03/10/18   Eustaquio Maize, MD  ibuprofen (ADVIL) 800 MG tablet 1 po bid with food 05/12/05   [provider]  Lancets Glory Rosebush ULTRASOFT) lancets Use as instructed 03/10/18   Eustaquio Maize, MD  levothyroxine (SYNTHROID) 125 MCG tablet Take 1 tablet (125 mcg total) by mouth daily. 11/19/19   Baruch Gouty, FNP  lisinopril (ZESTRIL) 20 MG tablet Take 1 tablet (20 mg total) by mouth daily. 03/04/20   Hassell Done Mary-Margaret, FNP  metFORMIN (GLUCOPHAGE) 500 MG tablet Take 1 tablet (500 mg total) by mouth 2 (two) times daily with a meal. 02/26/20 05/26/20  Dettinger, Fransisca Kaufmann, MD  metoprolol tartrate (LOPRESSOR) 100 MG tablet TAKE ONE TABLET BY MOUTH TWICE DAILY 01/09/20   Rakes, Connye Burkitt, FNP  naproxen sodium (ALEVE) 220 MG tablet Take 220 mg by mouth 2 (two) times daily as needed (for pain).    [provider]  pravastatin (PRAVACHOL) 40 MG tablet Take 1 tablet (40 mg total) by mouth at bedtime. 08/15/19   Baruch Gouty, FNP  Semaglutide,0.25 or 0.5MG/DOS, (OZEMPIC, 0.25 OR 0.5 MG/DOSE,) 2 MG/1.5ML SOPN Inject into the skin.    [provider]    Allergies    Semaglutide, Farxiga [dapagliflozin], and Penicillins  Review of Systems   Review of Systems  Neurological: Positive for facial asymmetry, speech difficulty and  weakness.  All other systems reviewed and are negative.   Physical Exam Updated Vital Signs BP (!) 121/106 (BP Location: Left Arm)   Pulse 90   Temp 98.2 F (36.8 C) (Oral)   Resp 18   SpO2 98%   Physical Exam Vitals and nursing note reviewed.  Constitutional:      Appearance: She is well-developed.  HENT:     Head: Normocephalic and atraumatic.  Eyes:     Conjunctiva/sclera: Conjunctivae normal.     Pupils: Pupils are equal, round, and reactive to light.  Cardiovascular:     Rate and Rhythm: Normal rate and regular rhythm.     Heart sounds: Normal heart sounds.  Pulmonary:     Effort: Pulmonary effort is normal. No respiratory distress.     Breath sounds: Normal breath sounds. No rhonchi.  Abdominal:     General: Bowel sounds are normal.     Palpations: Abdomen is soft.     Tenderness: There is no abdominal tenderness. There is no rebound.  Musculoskeletal:        General: Normal range of motion.     Cervical back: Normal range of motion.  Skin:    General: Skin is warm and dry.  Neurological:     Mental Status: She is alert and oriented to person, place, and time.     Comments: Awake, alert, oriented, able to answer some questions but speech is a little difficult to understand, left sided weakness  compared with right, subtle left facial droop     ED Results / Procedures / Treatments   Labs (all labs ordered are listed, but only abnormal results are displayed) Labs Reviewed  COMPREHENSIVE METABOLIC PANEL - Abnormal; Notable for the following components:      Result Value   CO2 21 (*)    Glucose, Bld 316 (*)    BUN 41 (*)    Creatinine, Ser 1.70 (*)    Albumin 3.4 (*)    GFR calc non Af Amer 29 (*)    GFR calc Af Amer 34 (*)    All other components within normal limits  I-STAT CHEM 8, ED - Abnormal; Notable for the following components:   BUN 39 (*)    Creatinine, Ser 1.60 (*)    Glucose, Bld 302 (*)    Calcium, Ion 1.14 (*)    TCO2 21 (*)    All other components within normal limits  SARS CORONAVIRUS 2 BY RT PCR (HOSPITAL ORDER, [place] [facility])  PROTIME-INR  APTT  CBC  DIFFERENTIAL  CBG MONITORING, ED    EKG None  Radiology CT ANGIO HEAD W OR WO CONTRAST  Result Date: 03/23/2020 CLINICAL DATA:  Left arm weakness and facial droop EXAM: CT ANGIOGRAPHY HEAD AND NECK TECHNIQUE: Multidetector CT imaging of the head and neck was performed using the standard protocol during bolus administration of intravenous contrast. Multiplanar CT image reconstructions and MIPs were obtained to evaluate the vascular anatomy. Carotid stenosis measurements (when applicable) are obtained utilizing NASCET criteria, using the distal internal carotid diameter as the denominator. CONTRAST:  52m OMNIPAQUE IOHEXOL 350 MG/ML SOLN COMPARISON:  None. FINDINGS: CTA NECK FINDINGS SKELETON: There is no bony spinal canal stenosis. No lytic or blastic lesion. OTHER NECK: Normal pharynx, larynx and major salivary glands. No cervical lymphadenopathy. Unremarkable thyroid gland. UPPER CHEST: No pneumothorax or pleural effusion. No nodules or masses. AORTIC ARCH: There is no calcific atherosclerosis of the aortic arch. There is no aneurysm, dissection  or hemodynamically significant  stenosis of the visualized portion of the aorta. Conventional 3 vessel aortic branching pattern. The visualized proximal subclavian arteries are widely patent. RIGHT CAROTID SYSTEM: Normal without aneurysm, dissection or stenosis. LEFT CAROTID SYSTEM: Normal without aneurysm, dissection or stenosis. VERTEBRAL ARTERIES: Left dominant configuration. Both origins are clearly patent. There is no dissection, occlusion or flow-limiting stenosis to the skull base (V1-V3 segments). CTA HEAD FINDINGS POSTERIOR CIRCULATION: --Vertebral arteries: Normal V4 segments. --Inferior cerebellar arteries: Normal. --Basilar artery: Normal. --Superior cerebellar arteries: Normal. --Posterior cerebral arteries (PCA): Normal. ANTERIOR CIRCULATION: --Intracranial internal carotid arteries: Normal. --Anterior cerebral arteries (ACA): Normal. Both A1 segments are present. Patent anterior communicating artery (a-comm). --Middle cerebral arteries (MCA): There is a distal right MCA M1 segment occlusion. Left MCA is normal. VENOUS SINUSES: As permitted by contrast timing, patent. ANATOMIC VARIANTS: Fetal origin of the right posterior cerebral artery. There is moderate collateral flow in the right MCA territory. Review of the MIP images confirms the above findings. IMPRESSION: 1. Distal right MCA M1 segment occlusion with moderate collateral flow in the right MCA territory. 2. No other intracranial arterial occlusion or high-grade stenosis. 3. Normal cervical carotid and vertebral arteries. Critical Value/emergent results were called by telephone at the time of interpretation on 03/23/2020 at 11:13 pm to provider Samara Snide , who verbally acknowledged these results. Electronically Signed   By: Ulyses Jarred M.D.   On: 03/23/2020 23:17   CT ANGIO NECK W OR WO CONTRAST  Result Date: 03/23/2020 CLINICAL DATA:  Left arm weakness and facial droop EXAM: CT ANGIOGRAPHY HEAD AND NECK TECHNIQUE: Multidetector CT imaging of the head and neck was  performed using the standard protocol during bolus administration of intravenous contrast. Multiplanar CT image reconstructions and MIPs were obtained to evaluate the vascular anatomy. Carotid stenosis measurements (when applicable) are obtained utilizing NASCET criteria, using the distal internal carotid diameter as the denominator. CONTRAST:  64m OMNIPAQUE IOHEXOL 350 MG/ML SOLN COMPARISON:  None. FINDINGS: CTA NECK FINDINGS SKELETON: There is no bony spinal canal stenosis. No lytic or blastic lesion. OTHER NECK: Normal pharynx, larynx and major salivary glands. No cervical lymphadenopathy. Unremarkable thyroid gland. UPPER CHEST: No pneumothorax or pleural effusion. No nodules or masses. AORTIC ARCH: There is no calcific atherosclerosis of the aortic arch. There is no aneurysm, dissection or hemodynamically significant stenosis of the visualized portion of the aorta. Conventional 3 vessel aortic branching pattern. The visualized proximal subclavian arteries are widely patent. RIGHT CAROTID SYSTEM: Normal without aneurysm, dissection or stenosis. LEFT CAROTID SYSTEM: Normal without aneurysm, dissection or stenosis. VERTEBRAL ARTERIES: Left dominant configuration. Both origins are clearly patent. There is no dissection, occlusion or flow-limiting stenosis to the skull base (V1-V3 segments). CTA HEAD FINDINGS POSTERIOR CIRCULATION: --Vertebral arteries: Normal V4 segments. --Inferior cerebellar arteries: Normal. --Basilar artery: Normal. --Superior cerebellar arteries: Normal. --Posterior cerebral arteries (PCA): Normal. ANTERIOR CIRCULATION: --Intracranial internal carotid arteries: Normal. --Anterior cerebral arteries (ACA): Normal. Both A1 segments are present. Patent anterior communicating artery (a-comm). --Middle cerebral arteries (MCA): There is a distal right MCA M1 segment occlusion. Left MCA is normal. VENOUS SINUSES: As permitted by contrast timing, patent. ANATOMIC VARIANTS: Fetal origin of the right  posterior cerebral artery. There is moderate collateral flow in the right MCA territory. Review of the MIP images confirms the above findings. IMPRESSION: 1. Distal right MCA M1 segment occlusion with moderate collateral flow in the right MCA territory. 2. No other intracranial arterial occlusion or high-grade stenosis. 3. Normal cervical carotid and vertebral arteries. Critical  Value/emergent results were called by telephone at the time of interpretation on 03/23/2020 at 11:13 pm to provider Samara Snide , who verbally acknowledged these results. Electronically Signed   By: Ulyses Jarred M.D.   On: 03/23/2020 23:17   CT HEAD CODE STROKE WO CONTRAST  Result Date: 03/23/2020 CLINICAL DATA:  Code stroke.  Left arm weakness and facial droop. EXAM: CT HEAD WITHOUT CONTRAST TECHNIQUE: Contiguous axial images were obtained from the base of the skull through the vertex without intravenous contrast. COMPARISON:  None. FINDINGS: Brain: There is no mass, hemorrhage or extra-axial collection. The size and configuration of the ventricles and extra-axial CSF spaces are normal. There is hypoattenuation of the periventricular white matter, most commonly indicating chronic ischemic microangiopathy. Vascular: No abnormal hyperdensity of the major intracranial arteries or dural venous sinuses. No intracranial atherosclerosis. Skull: The visualized skull base, calvarium and extracranial soft tissues are normal. Sinuses/Orbits: No fluid levels or advanced mucosal thickening of the visualized paranasal sinuses. No mastoid or middle ear effusion. The orbits are normal. ASPECTS [facility] Stroke Program Early CT Score) - Ganglionic level infarction (caudate, lentiform nuclei, internal capsule, insula, M1-M3 cortex): 7 - Supraganglionic infarction (M4-M6 cortex): 3 Total score (0-10 with 10 being normal): 10 IMPRESSION: 1. No acute intracranial abnormality 2. ASPECTS is 10. These results were communicated to Dr. Karena Addison Aroor at 10:41  pm on 03/23/2020 by text page via the [facility] messaging system. Electronically Signed   By: Ulyses Jarred M.D.   On: 03/23/2020 22:41    Procedures Procedures (including critical care time)  CRITICAL CARE Performed by: Larene Pickett   Total critical care time: 45 minutes  Critical care time was exclusive of separately billable procedures and treating other patients.  Critical care was necessary to treat or prevent imminent or life-threatening deterioration.  Critical care was time spent personally by me on the following activities: development of treatment plan with patient and/or surrogate as well as nursing, discussions with consultants, evaluation of patient's response to treatment, examination of patient, obtaining history from patient or surrogate, ordering and performing treatments and interventions, ordering and review of laboratory studies, ordering and review of radiographic studies, pulse oximetry and re-evaluation of patient's condition.   Medications Ordered in ED Medications  sodium chloride flush (NS) 0.9 % injection 3 mL (has no administration in time range)  alteplase (ACTIVASE) 1 mg/mL infusion 69.3 mg (62.4 mg Intravenous New Bag/Given 03/23/20 2252)    Followed by  0.9 %  sodium chloride infusion (has no administration in time range)  clevidipine (CLEVIPREX) 0.5 MG/ML infusion (has no administration in time range)  labetalol (NORMODYNE) 5 MG/ML injection (has no administration in time range)  iohexol (OMNIPAQUE) 350 MG/ML injection 80 mL (80 mLs Intravenous Contrast Given 03/23/20 2249)    ED Course  I have reviewed the triage vital signs and the nursing notes.  Pertinent labs & imaging results that were available during my care of the patient were reviewed by me and considered in my medical decision making (see chart for details).    MDM Rules/Calculators/A&P  74 y.o. F presenting to the ED as code stroke.  Husband alerted son to slurred speech, left sided  weakness, and left facial droop at 8:50PM.  Code stroke was initiated in triage.  Initial NIH score of 5.  CT head without findings.  CTA does reveal LVO-- distal right MCA M1 occlusion.  tpa has been given.  BP fairly well controlled, cleviprex started as well.  Patient does have some  bloody urine here in ED but states that is not new.  She is scheduled to see urology soon about this.  Symptoms did improve after tpa initiated.  Left sided weakness improving, speech more clear, left facial droop has resolved.  Patient taken to IR for further management.  Neurology will admit.  Final Clinical Impression(s) / ED Diagnoses Final diagnoses:  Acute ischemic right MCA stroke [facility])    Rx / DC Orders ED Discharge Orders    None       Larene Pickett, PA-C 03/24/20 0355    Orpah Greek, MD 04/08/20 [phone]

## 2020-03-23 NOTE — Progress Notes (Signed)
Pharmacist Code Stroke Response  Notified to mix tPA at 2243 by Dr. Laurence Slate Delivered tPA to RN at 2248  tPA dose = 6.9 mg bolus over 1 minute followed by 62.4mg  for a total dose of 69.3 mg over 1 hour  Issues/delays encountered (if applicable): n/a  Georgina Pillion, PharmD, BCPS  10:54 PM

## 2020-03-24 ENCOUNTER — Inpatient Hospital Stay (HOSPITAL_COMMUNITY): Payer: Medicare Other

## 2020-03-24 ENCOUNTER — Encounter (HOSPITAL_COMMUNITY): Payer: Self-pay | Admitting: Interventional Radiology

## 2020-03-24 DIAGNOSIS — J9600 Acute respiratory failure, unspecified whether with hypoxia or hypercapnia: Secondary | ICD-10-CM | POA: Diagnosis not present

## 2020-03-24 DIAGNOSIS — N182 Chronic kidney disease, stage 2 (mild): Secondary | ICD-10-CM | POA: Diagnosis present

## 2020-03-24 DIAGNOSIS — I129 Hypertensive chronic kidney disease with stage 1 through stage 4 chronic kidney disease, or unspecified chronic kidney disease: Secondary | ICD-10-CM | POA: Diagnosis not present

## 2020-03-24 DIAGNOSIS — N179 Acute kidney failure, unspecified: Secondary | ICD-10-CM | POA: Diagnosis not present

## 2020-03-24 DIAGNOSIS — Z87442 Personal history of urinary calculi: Secondary | ICD-10-CM | POA: Diagnosis not present

## 2020-03-24 DIAGNOSIS — E876 Hypokalemia: Secondary | ICD-10-CM | POA: Diagnosis not present

## 2020-03-24 DIAGNOSIS — E039 Hypothyroidism, unspecified: Secondary | ICD-10-CM | POA: Diagnosis not present

## 2020-03-24 DIAGNOSIS — N183 Chronic kidney disease, stage 3 unspecified: Secondary | ICD-10-CM | POA: Diagnosis not present

## 2020-03-24 DIAGNOSIS — E118 Type 2 diabetes mellitus with unspecified complications: Secondary | ICD-10-CM | POA: Diagnosis not present

## 2020-03-24 DIAGNOSIS — I63511 Cerebral infarction due to unspecified occlusion or stenosis of right middle cerebral artery: Secondary | ICD-10-CM | POA: Diagnosis present

## 2020-03-24 DIAGNOSIS — A419 Sepsis, unspecified organism: Secondary | ICD-10-CM | POA: Diagnosis not present

## 2020-03-24 DIAGNOSIS — N3091 Cystitis, unspecified with hematuria: Secondary | ICD-10-CM | POA: Diagnosis not present

## 2020-03-24 DIAGNOSIS — E1142 Type 2 diabetes mellitus with diabetic polyneuropathy: Secondary | ICD-10-CM | POA: Diagnosis not present

## 2020-03-24 DIAGNOSIS — G9341 Metabolic encephalopathy: Secondary | ICD-10-CM | POA: Diagnosis not present

## 2020-03-24 DIAGNOSIS — G8110 Spastic hemiplegia affecting unspecified side: Secondary | ICD-10-CM | POA: Diagnosis not present

## 2020-03-24 DIAGNOSIS — J9602 Acute respiratory failure with hypercapnia: Secondary | ICD-10-CM

## 2020-03-24 DIAGNOSIS — J9811 Atelectasis: Secondary | ICD-10-CM | POA: Diagnosis not present

## 2020-03-24 DIAGNOSIS — E1122 Type 2 diabetes mellitus with diabetic chronic kidney disease: Secondary | ICD-10-CM | POA: Diagnosis not present

## 2020-03-24 DIAGNOSIS — I6389 Other cerebral infarction: Secondary | ICD-10-CM

## 2020-03-24 DIAGNOSIS — H35039 Hypertensive retinopathy, unspecified eye: Secondary | ICD-10-CM | POA: Diagnosis present

## 2020-03-24 DIAGNOSIS — I6601 Occlusion and stenosis of right middle cerebral artery: Secondary | ICD-10-CM | POA: Diagnosis present

## 2020-03-24 DIAGNOSIS — E785 Hyperlipidemia, unspecified: Secondary | ICD-10-CM | POA: Diagnosis not present

## 2020-03-24 DIAGNOSIS — I69320 Aphasia following cerebral infarction: Secondary | ICD-10-CM | POA: Diagnosis not present

## 2020-03-24 DIAGNOSIS — D631 Anemia in chronic kidney disease: Secondary | ICD-10-CM | POA: Diagnosis not present

## 2020-03-24 DIAGNOSIS — R414 Neurologic neglect syndrome: Secondary | ICD-10-CM | POA: Diagnosis not present

## 2020-03-24 DIAGNOSIS — E1169 Type 2 diabetes mellitus with other specified complication: Secondary | ICD-10-CM | POA: Diagnosis not present

## 2020-03-24 DIAGNOSIS — R29810 Facial weakness: Secondary | ICD-10-CM | POA: Diagnosis present

## 2020-03-24 DIAGNOSIS — E1165 Type 2 diabetes mellitus with hyperglycemia: Secondary | ICD-10-CM | POA: Diagnosis not present

## 2020-03-24 DIAGNOSIS — N3001 Acute cystitis with hematuria: Secondary | ICD-10-CM | POA: Diagnosis not present

## 2020-03-24 DIAGNOSIS — I63311 Cerebral infarction due to thrombosis of right middle cerebral artery: Secondary | ICD-10-CM | POA: Diagnosis not present

## 2020-03-24 DIAGNOSIS — M810 Age-related osteoporosis without current pathological fracture: Secondary | ICD-10-CM | POA: Diagnosis not present

## 2020-03-24 DIAGNOSIS — N136 Pyonephrosis: Secondary | ICD-10-CM | POA: Diagnosis present

## 2020-03-24 DIAGNOSIS — J9601 Acute respiratory failure with hypoxia: Secondary | ICD-10-CM | POA: Diagnosis not present

## 2020-03-24 DIAGNOSIS — I69354 Hemiplegia and hemiparesis following cerebral infarction affecting left non-dominant side: Secondary | ICD-10-CM | POA: Diagnosis not present

## 2020-03-24 DIAGNOSIS — I10 Essential (primary) hypertension: Secondary | ICD-10-CM | POA: Diagnosis not present

## 2020-03-24 DIAGNOSIS — R519 Headache, unspecified: Secondary | ICD-10-CM | POA: Diagnosis not present

## 2020-03-24 DIAGNOSIS — R509 Fever, unspecified: Secondary | ICD-10-CM | POA: Diagnosis not present

## 2020-03-24 DIAGNOSIS — R1110 Vomiting, unspecified: Secondary | ICD-10-CM | POA: Diagnosis not present

## 2020-03-24 DIAGNOSIS — K5900 Constipation, unspecified: Secondary | ICD-10-CM | POA: Diagnosis not present

## 2020-03-24 DIAGNOSIS — I639 Cerebral infarction, unspecified: Secondary | ICD-10-CM | POA: Diagnosis not present

## 2020-03-24 DIAGNOSIS — I69391 Dysphagia following cerebral infarction: Secondary | ICD-10-CM | POA: Diagnosis not present

## 2020-03-24 DIAGNOSIS — G8114 Spastic hemiplegia affecting left nondominant side: Secondary | ICD-10-CM | POA: Diagnosis not present

## 2020-03-24 DIAGNOSIS — L89151 Pressure ulcer of sacral region, stage 1: Secondary | ICD-10-CM | POA: Diagnosis not present

## 2020-03-24 DIAGNOSIS — N1330 Unspecified hydronephrosis: Secondary | ICD-10-CM | POA: Diagnosis not present

## 2020-03-24 DIAGNOSIS — R0989 Other specified symptoms and signs involving the circulatory and respiratory systems: Secondary | ICD-10-CM | POA: Diagnosis not present

## 2020-03-24 DIAGNOSIS — T17908A Unspecified foreign body in respiratory tract, part unspecified causing other injury, initial encounter: Secondary | ICD-10-CM

## 2020-03-24 DIAGNOSIS — E11319 Type 2 diabetes mellitus with unspecified diabetic retinopathy without macular edema: Secondary | ICD-10-CM | POA: Diagnosis not present

## 2020-03-24 DIAGNOSIS — D72829 Elevated white blood cell count, unspecified: Secondary | ICD-10-CM | POA: Diagnosis not present

## 2020-03-24 DIAGNOSIS — I058 Other rheumatic mitral valve diseases: Secondary | ICD-10-CM | POA: Diagnosis not present

## 2020-03-24 DIAGNOSIS — L89321 Pressure ulcer of left buttock, stage 1: Secondary | ICD-10-CM | POA: Diagnosis not present

## 2020-03-24 DIAGNOSIS — K5901 Slow transit constipation: Secondary | ICD-10-CM | POA: Diagnosis not present

## 2020-03-24 DIAGNOSIS — A499 Bacterial infection, unspecified: Secondary | ICD-10-CM | POA: Diagnosis not present

## 2020-03-24 DIAGNOSIS — R310 Gross hematuria: Secondary | ICD-10-CM | POA: Diagnosis not present

## 2020-03-24 DIAGNOSIS — I69322 Dysarthria following cerebral infarction: Secondary | ICD-10-CM | POA: Diagnosis not present

## 2020-03-24 DIAGNOSIS — J811 Chronic pulmonary edema: Secondary | ICD-10-CM | POA: Diagnosis not present

## 2020-03-24 DIAGNOSIS — R29705 NIHSS score 5: Secondary | ICD-10-CM | POA: Diagnosis present

## 2020-03-24 DIAGNOSIS — G8194 Hemiplegia, unspecified affecting left nondominant side: Secondary | ICD-10-CM | POA: Diagnosis present

## 2020-03-24 DIAGNOSIS — R259 Unspecified abnormal involuntary movements: Secondary | ICD-10-CM | POA: Diagnosis not present

## 2020-03-24 DIAGNOSIS — N1831 Chronic kidney disease, stage 3a: Secondary | ICD-10-CM | POA: Diagnosis not present

## 2020-03-24 DIAGNOSIS — R1312 Dysphagia, oropharyngeal phase: Secondary | ICD-10-CM | POA: Diagnosis not present

## 2020-03-24 DIAGNOSIS — J810 Acute pulmonary edema: Secondary | ICD-10-CM | POA: Diagnosis not present

## 2020-03-24 DIAGNOSIS — R4701 Aphasia: Secondary | ICD-10-CM | POA: Diagnosis present

## 2020-03-24 DIAGNOSIS — J939 Pneumothorax, unspecified: Secondary | ICD-10-CM | POA: Diagnosis not present

## 2020-03-24 DIAGNOSIS — Z978 Presence of other specified devices: Secondary | ICD-10-CM

## 2020-03-24 DIAGNOSIS — Z8249 Family history of ischemic heart disease and other diseases of the circulatory system: Secondary | ICD-10-CM | POA: Diagnosis not present

## 2020-03-24 DIAGNOSIS — B961 Klebsiella pneumoniae [K. pneumoniae] as the cause of diseases classified elsewhere: Secondary | ICD-10-CM | POA: Diagnosis present

## 2020-03-24 DIAGNOSIS — Z8616 Personal history of COVID-19: Secondary | ICD-10-CM | POA: Diagnosis not present

## 2020-03-24 DIAGNOSIS — N390 Urinary tract infection, site not specified: Secondary | ICD-10-CM | POA: Diagnosis not present

## 2020-03-24 DIAGNOSIS — Z79899 Other long term (current) drug therapy: Secondary | ICD-10-CM | POA: Diagnosis not present

## 2020-03-24 DIAGNOSIS — E11311 Type 2 diabetes mellitus with unspecified diabetic retinopathy with macular edema: Secondary | ICD-10-CM | POA: Diagnosis present

## 2020-03-24 DIAGNOSIS — I340 Nonrheumatic mitral (valve) insufficiency: Secondary | ICD-10-CM | POA: Diagnosis not present

## 2020-03-24 DIAGNOSIS — I63411 Cerebral infarction due to embolism of right middle cerebral artery: Secondary | ICD-10-CM | POA: Diagnosis not present

## 2020-03-24 HISTORY — PX: IR CT HEAD LTD: IMG2386

## 2020-03-24 HISTORY — PX: IR PERCUTANEOUS ART THROMBECTOMY/INFUSION INTRACRANIAL INC DIAG ANGIO: IMG6087

## 2020-03-24 LAB — CBC WITH DIFFERENTIAL/PLATELET
Abs Immature Granulocytes: 0.05 10*3/uL (ref 0.00–0.07)
Basophils Absolute: 0 10*3/uL (ref 0.0–0.1)
Basophils Relative: 0 %
Eosinophils Absolute: 0 10*3/uL (ref 0.0–0.5)
Eosinophils Relative: 0 %
HCT: 34.8 % — ABNORMAL LOW (ref 36.0–46.0)
Hemoglobin: 12.1 g/dL (ref 12.0–15.0)
Immature Granulocytes: 0 %
Lymphocytes Relative: 10 %
Lymphs Abs: 1.2 10*3/uL (ref 0.7–4.0)
MCH: 30.6 pg (ref 26.0–34.0)
MCHC: 34.8 g/dL (ref 30.0–36.0)
MCV: 87.9 fL (ref 80.0–100.0)
Monocytes Absolute: 0.9 10*3/uL (ref 0.1–1.0)
Monocytes Relative: 7 %
Neutro Abs: 10 10*3/uL — ABNORMAL HIGH (ref 1.7–7.7)
Neutrophils Relative %: 83 %
Platelets: 224 10*3/uL (ref 150–400)
RBC: 3.96 MIL/uL (ref 3.87–5.11)
RDW: 13.5 % (ref 11.5–15.5)
WBC: 12.2 10*3/uL — ABNORMAL HIGH (ref 4.0–10.5)
nRBC: 0 % (ref 0.0–0.2)

## 2020-03-24 LAB — COMPREHENSIVE METABOLIC PANEL
ALT: 15 U/L (ref 0–44)
AST: 21 U/L (ref 15–41)
Albumin: 3 g/dL — ABNORMAL LOW (ref 3.5–5.0)
Alkaline Phosphatase: 52 U/L (ref 38–126)
Anion gap: 13 (ref 5–15)
BUN: 41 mg/dL — ABNORMAL HIGH (ref 8–23)
CO2: 17 mmol/L — ABNORMAL LOW (ref 22–32)
Calcium: 8.6 mg/dL — ABNORMAL LOW (ref 8.9–10.3)
Chloride: 108 mmol/L (ref 98–111)
Creatinine, Ser: 1.79 mg/dL — ABNORMAL HIGH (ref 0.44–1.00)
GFR calc Af Amer: 32 mL/min — ABNORMAL LOW (ref >60)
GFR calc non Af Amer: 28 mL/min — ABNORMAL LOW (ref >60)
Glucose, Bld: 298 mg/dL — ABNORMAL HIGH (ref 70–99)
Potassium: 4 mmol/L (ref 3.5–5.1)
Sodium: 138 mmol/L (ref 135–145)
Total Bilirubin: 0.4 mg/dL (ref 0.3–1.2)
Total Protein: 6.4 g/dL — ABNORMAL LOW (ref 6.5–8.1)

## 2020-03-24 LAB — GLUCOSE, CAPILLARY
Glucose-Capillary: 284 mg/dL — ABNORMAL HIGH (ref 70–99)
Glucose-Capillary: 364 mg/dL — ABNORMAL HIGH (ref 70–99)
Glucose-Capillary: 259 mg/dL — ABNORMAL HIGH (ref 70–99)
Glucose-Capillary: 277 mg/dL — ABNORMAL HIGH (ref 70–99)
Glucose-Capillary: 283 mg/dL — ABNORMAL HIGH (ref 70–99)
Glucose-Capillary: 336 mg/dL — ABNORMAL HIGH (ref 70–99)
Glucose-Capillary: 287 mg/dL — ABNORMAL HIGH (ref 70–99)
Glucose-Capillary: 235 mg/dL — ABNORMAL HIGH (ref 70–99)
Glucose-Capillary: 187 mg/dL — ABNORMAL HIGH (ref 70–99)
Glucose-Capillary: 222 mg/dL — ABNORMAL HIGH (ref 70–99)

## 2020-03-24 LAB — CBC
HCT: 35.3 % — ABNORMAL LOW (ref 36.0–46.0)
Hemoglobin: 12 g/dL (ref 12.0–15.0)
MCH: 29.8 pg (ref 26.0–34.0)
MCHC: 34 g/dL (ref 30.0–36.0)
MCV: 87.6 fL (ref 80.0–100.0)
Platelets: 216 10*3/uL (ref 150–400)
RBC: 4.03 MIL/uL (ref 3.87–5.11)
RDW: 13.6 % (ref 11.5–15.5)
WBC: 18.6 10*3/uL — ABNORMAL HIGH (ref 4.0–10.5)
nRBC: 0 % (ref 0.0–0.2)

## 2020-03-24 LAB — POCT I-STAT 7, (LYTES, BLD GAS, ICA,H+H)
Acid-base deficit: 4 mmol/L — ABNORMAL HIGH (ref 0.0–2.0)
Acid-base deficit: 6 mmol/L — ABNORMAL HIGH (ref 0.0–2.0)
Bicarbonate: 21.2 mmol/L (ref 20.0–28.0)
Bicarbonate: 17.1 mmol/L — ABNORMAL LOW (ref 20.0–28.0)
Calcium, Ion: 1.21 mmol/L (ref 1.15–1.40)
Calcium, Ion: 1.23 mmol/L (ref 1.15–1.40)
HCT: 33 % — ABNORMAL LOW (ref 36.0–46.0)
HCT: 31 % — ABNORMAL LOW (ref 36.0–46.0)
Hemoglobin: 11.2 g/dL — ABNORMAL LOW (ref 12.0–15.0)
Hemoglobin: 10.5 g/dL — ABNORMAL LOW (ref 12.0–15.0)
O2 Saturation: 99 %
O2 Saturation: 97 %
Patient temperature: 98.2
Patient temperature: 102.3
Potassium: 3.8 mmol/L (ref 3.5–5.1)
Potassium: 3.9 mmol/L (ref 3.5–5.1)
Sodium: 139 mmol/L (ref 135–145)
Sodium: 143 mmol/L (ref 135–145)
TCO2: 22 mmol/L (ref 22–32)
TCO2: 18 mmol/L — ABNORMAL LOW (ref 22–32)
pCO2 arterial: 38.7 mmHg (ref 32.0–48.0)
pCO2 arterial: 29.4 mmHg — ABNORMAL LOW (ref 32.0–48.0)
pH, Arterial: 7.346 — ABNORMAL LOW (ref 7.350–7.450)
pH, Arterial: 7.383 (ref 7.350–7.450)
pO2, Arterial: 157 mmHg — ABNORMAL HIGH (ref 83.0–108.0)
pO2, Arterial: 96 mmHg (ref 83.0–108.0)

## 2020-03-24 LAB — AMYLASE: Amylase: 98 U/L (ref 28–100)

## 2020-03-24 LAB — URINALYSIS, ROUTINE W REFLEX MICROSCOPIC
Bilirubin Urine: NEGATIVE
Glucose, UA: >=500 mg/dL — AB
Ketones, ur: NEGATIVE mg/dL
Nitrite: NEGATIVE
Protein, ur: 100 mg/dL — AB
RBC / HPF: >50 RBC/hpf — ABNORMAL HIGH (ref 0–5)
Specific Gravity, Urine: 1.025 (ref 1.005–1.030)
WBC, UA: >50 WBC/hpf — ABNORMAL HIGH (ref 0–5)
pH: 5 (ref 5.0–8.0)

## 2020-03-24 LAB — BASIC METABOLIC PANEL
Anion gap: 11 (ref 5–15)
BUN: 39 mg/dL — ABNORMAL HIGH (ref 8–23)
CO2: 19 mmol/L — ABNORMAL LOW (ref 22–32)
Calcium: 8.3 mg/dL — ABNORMAL LOW (ref 8.9–10.3)
Chloride: 106 mmol/L (ref 98–111)
Creatinine, Ser: 1.54 mg/dL — ABNORMAL HIGH (ref 0.44–1.00)
GFR calc Af Amer: 38 mL/min — ABNORMAL LOW (ref >60)
GFR calc non Af Amer: 33 mL/min — ABNORMAL LOW (ref >60)
Glucose, Bld: 308 mg/dL — ABNORMAL HIGH (ref 70–99)
Potassium: 4 mmol/L (ref 3.5–5.1)
Sodium: 136 mmol/L (ref 135–145)

## 2020-03-24 LAB — TRIGLYCERIDES: Triglycerides: 605 mg/dL — ABNORMAL HIGH (ref <150)

## 2020-03-24 LAB — LACTIC ACID, PLASMA
Lactic Acid, Venous: 4.1 mmol/L (ref 0.5–1.9)
Lactic Acid, Venous: 3.2 mmol/L (ref 0.5–1.9)

## 2020-03-24 LAB — LIPID PANEL
Cholesterol: 156 mg/dL (ref 0–200)
HDL: 27 mg/dL — ABNORMAL LOW (ref >40)
LDL Cholesterol: UNDETERMINED mg/dL (ref 0–99)
Total CHOL/HDL Ratio: 5.8 RATIO
Triglycerides: 618 mg/dL — ABNORMAL HIGH (ref <150)
VLDL: UNDETERMINED mg/dL (ref 0–40)

## 2020-03-24 LAB — PROCALCITONIN: Procalcitonin: 0.24 ng/mL

## 2020-03-24 LAB — PROTIME-INR
INR: 1.1 (ref 0.8–1.2)
Prothrombin Time: 13.9 seconds (ref 11.4–15.2)

## 2020-03-24 LAB — LIPASE, BLOOD: Lipase: 27 U/L (ref 11–51)

## 2020-03-24 LAB — HEMOGLOBIN A1C
Hgb A1c MFr Bld: 8.9 % — ABNORMAL HIGH (ref 4.8–5.6)
Mean Plasma Glucose: 208.73 mg/dL

## 2020-03-24 LAB — TROPONIN I (HIGH SENSITIVITY): Troponin I (High Sensitivity): 28 ng/L — ABNORMAL HIGH (ref <18)

## 2020-03-24 LAB — ECHOCARDIOGRAM COMPLETE: Weight: 2716.07 oz

## 2020-03-24 LAB — MRSA PCR SCREENING: MRSA by PCR: NEGATIVE

## 2020-03-24 LAB — SARS CORONAVIRUS 2 BY RT PCR (HOSPITAL ORDER, PERFORMED IN ~~LOC~~ HOSPITAL LAB): SARS Coronavirus 2: NEGATIVE

## 2020-03-24 LAB — LDL CHOLESTEROL, DIRECT: Direct LDL: 63.8 mg/dL (ref 0–99)

## 2020-03-24 MED ORDER — SUCCINYLCHOLINE CHLORIDE 20 MG/ML IJ SOLN
INTRAMUSCULAR | Status: DC | PRN
  Administered 2020-03-23: 100 mg via INTRAVENOUS

## 2020-03-24 MED ORDER — SUGAMMADEX SODIUM 200 MG/2ML IV SOLN
INTRAVENOUS | Status: DC | PRN
Start: 2020-03-24 — End: 2020-03-24
  Administered 2020-03-24: 200 mg via INTRAVENOUS

## 2020-03-24 MED ORDER — PERFLUTREN LIPID MICROSPHERE
1.0000 mL | INTRAVENOUS | Status: AC | PRN
  Administered 2020-03-24: 3 mL via INTRAVENOUS
  Filled 2020-03-24: qty 10

## 2020-03-24 MED ORDER — SENNOSIDES-DOCUSATE SODIUM 8.6-50 MG PO TABS: 1.0000 | ORAL_TABLET | Freq: Every evening | ORAL | Status: DC | PRN

## 2020-03-24 MED ORDER — PANTOPRAZOLE SODIUM 40 MG IV SOLR
40.0000 mg | Freq: Two times a day (BID) | INTRAVENOUS | Status: DC
  Administered 2020-03-24 – 2020-03-26 (×4): 40 mg via INTRAVENOUS
  Filled 2020-03-24 (×4): qty 40

## 2020-03-24 MED ORDER — LACTATED RINGERS IV BOLUS
1000.0000 mL | Freq: Once | INTRAVENOUS | Status: AC
  Administered 2020-03-24: 1000 mL via INTRAVENOUS

## 2020-03-24 MED ORDER — SODIUM CHLORIDE 0.9 % IV SOLN: INTRAVENOUS | Status: DC

## 2020-03-24 MED ORDER — FENTANYL CITRATE (PF) 100 MCG/2ML IJ SOLN
25.0000 ug | INTRAMUSCULAR | Status: DC | PRN
  Administered 2020-03-24: 100 ug via INTRAVENOUS

## 2020-03-24 MED ORDER — ACETAMINOPHEN 650 MG RE SUPP
650.0000 mg | RECTAL | Status: DC | PRN
  Administered 2020-03-24 – 2020-03-25 (×3): 650 mg via RECTAL
  Filled 2020-03-24 (×3): qty 1

## 2020-03-24 MED ORDER — PROPOFOL 10 MG/ML IV BOLUS
INTRAVENOUS | Status: DC | PRN
  Administered 2020-03-23: 110 mg via INTRAVENOUS
  Administered 2020-03-24: 40 mg via INTRAVENOUS

## 2020-03-24 MED ORDER — ACETAMINOPHEN 160 MG/5ML PO SOLN: 650.0000 mg | ORAL | Status: DC | PRN

## 2020-03-24 MED ORDER — FENTANYL CITRATE (PF) 100 MCG/2ML IJ SOLN
INTRAMUSCULAR | Status: DC | PRN
  Administered 2020-03-24: 100 ug via INTRAVENOUS

## 2020-03-24 MED ORDER — ACETAMINOPHEN 325 MG PO TABS: 650.0000 mg | ORAL_TABLET | ORAL | Status: DC | PRN

## 2020-03-24 MED ORDER — INSULIN ASPART 100 UNIT/ML ~~LOC~~ SOLN
SUBCUTANEOUS | Status: DC | PRN
Start: 2020-03-24 — End: 2020-03-24
  Administered 2020-03-24: 6 [IU] via SUBCUTANEOUS

## 2020-03-24 MED ORDER — ONDANSETRON HCL 4 MG/2ML IJ SOLN: 4.0000 mg | Freq: Four times a day (QID) | INTRAMUSCULAR | Status: DC | PRN

## 2020-03-24 MED ORDER — VANCOMYCIN HCL 1000 MG IV SOLR
INTRAVENOUS | Status: DC | PRN
  Administered 2020-03-23: 1000 mg via INTRAVENOUS

## 2020-03-24 MED ORDER — INSULIN ASPART 100 UNIT/ML ~~LOC~~ SOLN
SUBCUTANEOUS | Status: AC
  Filled 2020-03-24: qty 1

## 2020-03-24 MED ORDER — ACETAMINOPHEN 160 MG/5ML PO SOLN
650.0000 mg | ORAL | Status: DC | PRN
  Administered 2020-03-27 – 2020-03-29 (×2): 650 mg
  Filled 2020-03-24 (×2): qty 20.3

## 2020-03-24 MED ORDER — DEXTROSE IN LACTATED RINGERS 5 % IV SOLN: INTRAVENOUS | Status: DC

## 2020-03-24 MED ORDER — LABETALOL HCL 5 MG/ML IV SOLN
INTRAVENOUS | Status: DC | PRN
  Administered 2020-03-24: 10 mg via INTRAVENOUS

## 2020-03-24 MED ORDER — NITROGLYCERIN 1 MG/10 ML FOR IR/CATH LAB
INTRA_ARTERIAL | Status: AC
  Filled 2020-03-24: qty 10

## 2020-03-24 MED ORDER — CISATRACURIUM BESYLATE (PF) 10 MG/5ML IV SOLN
INTRAVENOUS | Status: DC | PRN
  Administered 2020-03-24: 10 mg via INTRAVENOUS

## 2020-03-24 MED ORDER — PROPOFOL 500 MG/50ML IV EMUL
INTRAVENOUS | Status: DC | PRN
Start: 2020-03-24 — End: 2020-03-24
  Administered 2020-03-24: 50 ug/kg/min via INTRAVENOUS

## 2020-03-24 MED ORDER — CISATRACURIUM BESYLATE 20 MG/10ML IV SOLN
INTRAVENOUS | Status: AC
  Filled 2020-03-24: qty 10

## 2020-03-24 MED ORDER — ORAL CARE MOUTH RINSE
15.0000 mL | OROMUCOSAL | Status: DC
  Administered 2020-03-24 (×2): 15 mL via OROMUCOSAL

## 2020-03-24 MED ORDER — FENTANYL CITRATE (PF) 100 MCG/2ML IJ SOLN
INTRAMUSCULAR | Status: AC
  Filled 2020-03-24: qty 2

## 2020-03-24 MED ORDER — STROKE: EARLY STAGES OF RECOVERY BOOK: Freq: Once | Status: AC

## 2020-03-24 MED ORDER — INSULIN ASPART 100 UNIT/ML ~~LOC~~ SOLN: 0.0000 [IU] | SUBCUTANEOUS | Status: DC

## 2020-03-24 MED ORDER — SODIUM CHLORIDE (PF) 0.9 % IJ SOLN
INTRAVENOUS | Status: AC | PRN
  Administered 2020-03-24 (×2): 25 ug via INTRA_ARTERIAL

## 2020-03-24 MED ORDER — CLEVIDIPINE BUTYRATE 0.5 MG/ML IV EMUL
INTRAVENOUS | Status: AC
  Filled 2020-03-24: qty 50

## 2020-03-24 MED ORDER — CHLORHEXIDINE GLUCONATE 0.12% ORAL RINSE (MEDLINE KIT)
15.0000 mL | Freq: Two times a day (BID) | OROMUCOSAL | Status: DC
  Administered 2020-03-24: 15 mL via OROMUCOSAL

## 2020-03-24 MED ORDER — ACETAMINOPHEN 650 MG RE SUPP: 650.0000 mg | RECTAL | Status: DC | PRN

## 2020-03-24 MED ORDER — ONDANSETRON HCL 4 MG/2ML IJ SOLN
INTRAMUSCULAR | Status: DC | PRN
  Administered 2020-03-24: 4 mg via INTRAVENOUS

## 2020-03-24 MED ORDER — PHENYLEPHRINE HCL-NACL 10-0.9 MG/250ML-% IV SOLN
INTRAVENOUS | Status: DC | PRN
  Administered 2020-03-24: 50 ug/min via INTRAVENOUS

## 2020-03-24 MED ORDER — INSULIN ASPART 100 UNIT/ML ~~LOC~~ SOLN
0.0000 [IU] | SUBCUTANEOUS | Status: DC
  Administered 2020-03-24: 7 [IU] via SUBCUTANEOUS
  Administered 2020-03-24 (×2): 15 [IU] via SUBCUTANEOUS
  Administered 2020-03-24: 7 [IU] via SUBCUTANEOUS
  Administered 2020-03-24: 4 [IU] via SUBCUTANEOUS
  Administered 2020-03-25: 7 [IU] via SUBCUTANEOUS
  Administered 2020-03-25: 3 [IU] via SUBCUTANEOUS
  Administered 2020-03-25: 7 [IU] via SUBCUTANEOUS
  Administered 2020-03-25: 3 [IU] via SUBCUTANEOUS
  Administered 2020-03-25: 4 [IU] via SUBCUTANEOUS
  Administered 2020-03-25 – 2020-03-26 (×3): 3 [IU] via SUBCUTANEOUS
  Administered 2020-03-27: 7 [IU] via SUBCUTANEOUS
  Administered 2020-03-27: 11 [IU] via SUBCUTANEOUS
  Administered 2020-03-27: 15 [IU] via SUBCUTANEOUS
  Administered 2020-03-27: 7 [IU] via SUBCUTANEOUS
  Administered 2020-03-27 (×2): 11 [IU] via SUBCUTANEOUS
  Administered 2020-03-27: 4 [IU] via SUBCUTANEOUS
  Administered 2020-03-28: 7 [IU] via SUBCUTANEOUS
  Administered 2020-03-28: 15 [IU] via SUBCUTANEOUS
  Administered 2020-03-28 (×2): 11 [IU] via SUBCUTANEOUS
  Administered 2020-03-28: 7 [IU] via SUBCUTANEOUS
  Administered 2020-03-28 – 2020-03-29 (×3): 15 [IU] via SUBCUTANEOUS
  Administered 2020-03-29: 11 [IU] via SUBCUTANEOUS
  Administered 2020-03-29 (×2): 20 [IU] via SUBCUTANEOUS
  Administered 2020-03-30: 15 [IU] via SUBCUTANEOUS
  Administered 2020-03-30: 11 [IU] via SUBCUTANEOUS
  Administered 2020-03-30 (×2): 15 [IU] via SUBCUTANEOUS
  Administered 2020-03-30: 20 [IU] via SUBCUTANEOUS
  Administered 2020-03-30: 15 [IU] via SUBCUTANEOUS
  Administered 2020-03-30 – 2020-03-31 (×3): 11 [IU] via SUBCUTANEOUS
  Administered 2020-03-31 (×2): 15 [IU] via SUBCUTANEOUS
  Administered 2020-03-31: 20 [IU] via SUBCUTANEOUS
  Administered 2020-04-01: 11 [IU] via SUBCUTANEOUS
  Administered 2020-04-01: 15 [IU] via SUBCUTANEOUS
  Administered 2020-04-01: 11 [IU] via SUBCUTANEOUS
  Administered 2020-04-01 (×2): 7 [IU] via SUBCUTANEOUS
  Administered 2020-04-01 – 2020-04-02 (×3): 15 [IU] via SUBCUTANEOUS
  Administered 2020-04-02: 7 [IU] via SUBCUTANEOUS
  Administered 2020-04-02: 15 [IU] via SUBCUTANEOUS
  Administered 2020-04-02 – 2020-04-03 (×2): 7 [IU] via SUBCUTANEOUS
  Administered 2020-04-03: 4 [IU] via SUBCUTANEOUS
  Administered 2020-04-03 (×2): 7 [IU] via SUBCUTANEOUS
  Administered 2020-04-03: 11 [IU] via SUBCUTANEOUS
  Administered 2020-04-03 – 2020-04-04 (×3): 7 [IU] via SUBCUTANEOUS
  Administered 2020-04-04: 4 [IU] via SUBCUTANEOUS
  Administered 2020-04-04 (×2): 7 [IU] via SUBCUTANEOUS
  Administered 2020-04-04: 4 [IU] via SUBCUTANEOUS
  Administered 2020-04-05: 7 [IU] via SUBCUTANEOUS
  Administered 2020-04-05 (×2): 3 [IU] via SUBCUTANEOUS
  Administered 2020-04-05: 4 [IU] via SUBCUTANEOUS
  Administered 2020-04-05 (×2): 7 [IU] via SUBCUTANEOUS
  Administered 2020-04-06 (×2): 4 [IU] via SUBCUTANEOUS

## 2020-03-24 MED ORDER — SODIUM CHLORIDE 0.9 % IV SOLN
2.0000 g | INTRAVENOUS | Status: DC
  Administered 2020-03-24: 2 g via INTRAVENOUS
  Filled 2020-03-24: qty 2

## 2020-03-24 MED ORDER — POLYETHYLENE GLYCOL 3350 17 G PO PACK: 17.0000 g | PACK | Freq: Every day | ORAL | Status: DC

## 2020-03-24 MED ORDER — SODIUM CHLORIDE 0.9 % IV SOLN
INTRAVENOUS | Status: DC | PRN
Start: 2020-03-24 — End: 2020-03-24

## 2020-03-24 MED ORDER — FENTANYL CITRATE (PF) 100 MCG/2ML IJ SOLN: 25.0000 ug | INTRAMUSCULAR | Status: DC | PRN

## 2020-03-24 MED ORDER — LACTATED RINGERS IV SOLN: INTRAVENOUS | Status: DC | PRN

## 2020-03-24 MED ORDER — IOHEXOL 300 MG/ML  SOLN
150.0000 mL | Freq: Once | INTRAMUSCULAR | Status: AC | PRN
  Administered 2020-03-24: 85 mL via INTRA_ARTERIAL

## 2020-03-24 MED ORDER — ROCURONIUM BROMIDE 10 MG/ML (PF) SYRINGE
PREFILLED_SYRINGE | INTRAVENOUS | Status: DC | PRN
  Administered 2020-03-23: 50 mg via INTRAVENOUS
  Administered 2020-03-24: 15 mg via INTRAVENOUS
  Administered 2020-03-24: 10 mg via INTRAVENOUS

## 2020-03-24 MED ORDER — PANTOPRAZOLE SODIUM 40 MG IV SOLR
40.0000 mg | Freq: Every day | INTRAVENOUS | Status: DC
  Administered 2020-03-24: 40 mg via INTRAVENOUS

## 2020-03-24 MED ORDER — CLEVIDIPINE BUTYRATE 0.5 MG/ML IV EMUL
INTRAVENOUS | Status: DC | PRN
Start: 2020-03-24 — End: 2020-03-24
  Administered 2020-03-24: 2 mg/h via INTRAVENOUS

## 2020-03-24 MED ORDER — LIDOCAINE 2% (20 MG/ML) 5 ML SYRINGE
INTRAMUSCULAR | Status: DC | PRN
  Administered 2020-03-23: 60 mg via INTRAVENOUS

## 2020-03-24 MED ORDER — CLEVIDIPINE BUTYRATE 0.5 MG/ML IV EMUL
0.0000 mg/h | INTRAVENOUS | Status: AC
  Administered 2020-03-24: 7 mg/h via INTRAVENOUS
  Administered 2020-03-24: 5 mg/h via INTRAVENOUS
  Administered 2020-03-24: 16 mg/h via INTRAVENOUS
  Filled 2020-03-24 (×7): qty 50

## 2020-03-24 MED ORDER — CHLORHEXIDINE GLUCONATE CLOTH 2 % EX PADS
6.0000 | MEDICATED_PAD | Freq: Every day | CUTANEOUS | Status: DC
  Administered 2020-03-25 – 2020-04-11 (×18): 6 via TOPICAL

## 2020-03-24 MED ORDER — DOCUSATE SODIUM 50 MG/5ML PO LIQD: 100.0000 mg | Freq: Two times a day (BID) | ORAL | Status: DC

## 2020-03-24 MED ORDER — ONDANSETRON HCL 4 MG/2ML IJ SOLN
INTRAMUSCULAR | Status: AC
  Administered 2020-03-24: 4 mg
  Filled 2020-03-24: qty 2

## 2020-03-24 MED ORDER — PROPOFOL 1000 MG/100ML IV EMUL
5.0000 ug/kg/min | INTRAVENOUS | Status: DC
  Administered 2020-03-24: 20 ug/kg/min via INTRAVENOUS
  Filled 2020-03-24: qty 100

## 2020-03-24 MED ORDER — VANCOMYCIN HCL IN DEXTROSE 1-5 GM/200ML-% IV SOLN
INTRAVENOUS | Status: AC
  Filled 2020-03-24: qty 200

## 2020-03-24 NOTE — Progress Notes (Signed)
Lower extremity venous has been completed.   Preliminary results in CV Proc.   Blanch Media 03/24/2020 2:19 PM

## 2020-03-24 NOTE — Progress Notes (Signed)
SLP Cancellation Note  Patient Details [name] MRN: [id number] DOB: [birth year]   Cancelled treatment:       Reason Eval/Treat Not Completed: Medical issues which prohibited therapy. Pt on vent this am. Will f/u for cognitive evaluation.    Mahala Menghini., M.A. CCC-SLP Acute Rehabilitation Services Pager [phone] Office [phone]  03/24/2020, 7:00 AM

## 2020-03-24 NOTE — Significant Event (Signed)
Rapid Response Event Note   Code Stroke called at 2230. Pt with L sided weakness, facial droop and slurred speech. Initial NIH 5, tPA started at 2252, neuro changed noted with NIH of 9 at 2320. CTA showing R MCA M1 occlusion.  2325:  IR team activated  2331: pt arrived in IR bay 8, groin shaved, pulses marked. Belongings given to family.  2348: tPA completed 2351: Pt intubated by anesthesia 2354: Pt arrived in IR suite   Meredith Shaw

## 2020-03-24 NOTE — Progress Notes (Signed)
NAME:  Meredith Shaw, MRN:  [id number], DOB:  16-Dec-1945, LOS: 0 ADMISSION DATE:  03/23/2020, CONSULTATION DATE: 03/24/2020 REFERRING MD:  Aroor, CHIEF COMPLAINT: Right MCA M1 stroke  Brief History   74 year old female presented as code stroke, CT head negative, CTA showed right MCA M1 segment occlusion.  Status post TPA and stentriever on 03/24/2020.  History of present illness   74 year old female with past medical history of diabetes, hypertension who was doing well all day on Sunday.  Husband noted at 8:45 PM that patient has slurry speech.  Patient called his son who arrived in 15 minutes and noticed the facial droop, slurred speech as well as left upper extremity weakness.  The brought her to emergency department via POV.  History was taken from son and husband at the bedside who reported patient did not have any other symptoms throughout the day including fever, chills, nausea, vomiting, headache.  She also does not have any prior history of CVAs, CAD.  In the ED her vital signs were unremarkable, lab work-up showed blood glucose 316 with anion gap of 13, creatinine 1.70, normal LFTs and CBC.  INR was 1.0.  Screening SARS-CoV-2 negative.  CT head was negative CTA showed distal right MCA M1 segment occlusion with moderate collateral flow.  Patient received TPA and afterwards thrombectomy via stentriever by IR.  Patient was seen and examined in the neuro ICU after the procedure, she remains intubated and on propofol 50.  She appears to be saturating well and her FiO2 was decreased to 50%.  ABG shows 7.3 4/38/157 on FiO2 50%.  No history of tobacco use or any lung parenchymal disease, never used any inhalers.  Past Medical History  Hypertension, diabetes, hypothyroidism, hypertensive retinopathy  Significant Hospital Events   tPA movement at 2252 on 03/23/2020 S/P RT CCA angiogram followed by complete revascularization of occluded RT MCA M 1 seg with x 1 pass with 28mm x 37 mm embotrap  retriver and x 1 pass with solitaire56mm x 40 mm X retriver and penumbra aspiration  Achieving a TICI 3 revascularization.  Consults:  Neurology Interventional radiology  Procedures:  S/P RT CCA angiogram followed by complete revascularization of occluded RT MCA M 1 seg with x 1 pass with 46mm x 37 mm embotrap retriver and x 1 pass with solitaire74mm x 40 mm X retriver and penumbra aspiration  Achieving a TICI 3 revascularization.  Significant Diagnostic Tests:  CT head 6/13: No acute intracranial abnormality CT angio head and neck:  1. Distal right MCA M1 segment occlusion with moderate collateral flow in the right MCA territory. 2. No other intracranial arterial occlusion or high-grade stenosis. 3. Normal cervical carotid and vertebral arteries CT head 6/14 > neg.  Micro Data:    Antimicrobials:  none  Interim history/subjective:  Weaning on PSV 8/5.  Opens eyes with frequent verbal stimuli.  Objective   Blood pressure (!) 128/58, pulse 86, temperature 98.4 F (36.9 C), temperature source Axillary, resp. rate 13, weight 77 kg, SpO2 100 %.    Vent Mode: PRVC FiO2 (%):  [40 %-100 %] 40 % Set Rate:  [15 bmp] 15 bmp Vt Set:  [500 mL] 500 mL PEEP:  [5 cmH20] 5 cmH20 Plateau Pressure:  [11 cmH20-18 cmH20] 11 cmH20   Intake/Output Summary (Last 24 hours) at 03/24/2020 0748 Last data filed at 03/24/2020 0700 Gross per 24 hour  Intake 1333.71 ml  Output 150 ml  Net 1183.71 ml   Filed Weights   03/23/20 2233  Weight: 77 kg    Examination: General: Adult female, resting in bed, in NAD. Neuro: Sedated but opens eyes to verbal cues. HEENT: Ashley/AT. Sclerae anicteric. ETT in place. Cardiovascular: RRR, no M/R/G.  Lungs: Respirations even and unlabored.  CTA bilaterally, No W/R/R. Abdomen: BS x 4, soft, NT/ND.  Musculoskeletal: No gross deformities, no edema.  Skin: Intact, warm, no rashes.   Assessment & Plan:  74 year old female with past medical history of hypertension,  diabetes, hypertensive retinopathy was brought to emergency department by POV 10 PM after she was noted to have slurring of speech, left facial droop and left upper extremity weakness the time of onset at 8:45 PM.  Status post TPA.  CT head and neck showed right MCA M1 occlusion and patient underwent stentriever by IR.   Need for mechanical ventilation post procedure: - Continue weaning for hopeful extubation shortly. - Bronchial hygiene.  Right MCA stroke - Status post TPA, stentriever by IR. - Stroke workup per neuro.  Diabetes. - Continue SSI, change to resistant scale. - Hold home metformin, glimepiride, dulaglutide.  Check HbA1C. Hold metformin and start ISS for now.  Best practice:  Diet: NPO for now Pain/Anxiety/Delirium protocol (if indicated): Fentanyl prn, propofol VAP protocol (if indicated): ordered DVT prophylaxis: per primary team GI prophylaxis: Protonix Glucose control: Insulin SS Mobility: with PT once extubated Code Status: Full Family Communication: Per primary Disposition: remains in Neuro ICU  Critical care time: 30 min    Rutherford Guys, [place] [facility] 03/24/2020, 8:01 AM

## 2020-03-24 NOTE — Progress Notes (Signed)
eLink Physician-Brief Progress Note Patient [name] DOB: April 23, 1946 MRN: [id number]   Date of Service  03/24/2020  HPI/Events of Note  RN requests R soft wrist restraint to maintain patient safety.   eICU Interventions  Ordered restraint.     Intervention Category Minor Interventions: Agitation / anxiety - evaluation and management  Janae Bridgeman 03/24/2020, 4:42 AM

## 2020-03-24 NOTE — Progress Notes (Signed)
1800:Critical care at bedside assessing patient and updated patient's husband.  New orders received.    Neurology paged regarding patient's neuro status.  Patient is harder to arouse and require repeated painful stimulus to react. Neurologist updated and would like MRI.  Night shift RN updated.

## 2020-03-24 NOTE — Progress Notes (Signed)
PT Cancellation Note  Patient Details Name: RIHANA KIDDY MRN: [id number] DOB: [birth year]   Cancelled Treatment:    Reason Eval/Treat Not Completed: Active bedrest order .  Pt with stroke s/p tPA and IR.  She is less than 24 hours here.  Pt on strict bedrest per orders.  I will check in with neurologist and see when she is stable enough to start to mobilize (likely not until later today or tomorrow.    Thanks,  Corinna Capra, PT, DPT  Acute Rehabilitation [phone] pager #[phone] office      [name] B Elke Holtry 03/24/2020, 9:03 AM

## 2020-03-24 NOTE — Progress Notes (Signed)
CRITICAL VALUE ALERT  Critical Value:  Lactic acid 4.1  Date & Time Notied:  03/24/2020 1630  Provider Notified: CCM PA  Orders Received/Actions taken: No new orders at this time.

## 2020-03-24 NOTE — Progress Notes (Signed)
Inpatient Diabetes Program Recommendations  AACE/ADA: New Consensus Statement on Inpatient Glycemic Control (2015)  Target Ranges:  Prepandial:   less than 140 mg/dL      Peak postprandial:   less than 180 mg/dL (1-2 hours)      Critically ill patients:  140 - 180 mg/dL   Lab Results  Component Value Date   GLUCAP 364 (H) 03/24/2020   HGBA1C 8.9 (H) 03/24/2020    Review of Glycemic Control  Results for GAVRIELA, CASHIN (MRN [id number]) as of 03/24/2020 08:37  Ref. Range 03/24/2020 04:43 03/24/2020 08:07  Glucose-Capillary Latest Ref Range: 70 - 99 mg/dL 289 (H) 791 (H)    Diabetes history:  DM2  Outpatient Diabetes medications:  Ozempic 0.25 or 0.5 mg daily  Metformin 500 mg qam Amaryl 1 mg daily Trulicity 0.75 mg weekly  Current orders for Inpatient glycemic control:  Novolog 0-20 every 4 hours  Inpatient Diabetes Program Recommendations:     Lantus 12 units daily (0.15 units/kg)  Will continue to follow while inpatient.  Thank you, Dulce Sellar, RN, BSN Diabetes Coordinator Inpatient Diabetes Program [phone] (team pager from 8a-5p)

## 2020-03-24 NOTE — Progress Notes (Signed)
°  Echocardiogram 2D Echocardiogram has been performed.  Carvel Huskins G Alessa Mazur 03/24/2020, 10:05 AM

## 2020-03-24 NOTE — Progress Notes (Signed)
STROKE TEAM PROGRESS NOTE   INTERVAL HISTORY RN and echo tech as well as Dr. Marchelle Gearingamaswamy are at the bedside. Pt still intubated, but following simple commands on the right, still flaccid on the left. Right gaze preference. CCM planing for extubation today.   Vitals:   03/24/20 0645 03/24/20 0700 03/24/20 0726 03/24/20 0800  BP: (!) 121/91 114/66 (!) 128/58 115/66  Pulse: 87 88 86 91  Resp: 15 13  14   Temp:      TempSrc:      SpO2: 100% 100%  100%  Weight:        CBC:  Recent Labs  Lab 03/23/20 2227 03/23/20 2229 03/24/20 0337 03/24/20 0450  WBC 9.4  --   --  12.2*  NEUTROABS 6.5  --   --  10.0*  HGB 13.4   < > 11.2* 12.1  HCT 40.8   < > 33.0* 34.8*  MCV 89.7  --   --  87.9  PLT 222  --   --  224   < > = values in this interval not displayed.    Basic Metabolic Panel:  Recent Labs  Lab 03/23/20 2227 03/23/20 2227 03/23/20 2229 03/23/20 2229 03/24/20 0337 03/24/20 0450  NA 138   < > 142   < > 139 136  K 4.2   < > 4.2   < > 3.8 4.0  CL 104   < > 105  --   --  106  CO2 21*  --   --   --   --  19*  GLUCOSE 316*   < > 302*  --   --  308*  BUN 41*   < > 39*  --   --  39*  CREATININE 1.70*   < > 1.60*  --   --  1.54*  CALCIUM 9.3  --   --   --   --  8.3*   < > = values in this interval not displayed.   Lipid Panel:     Component Value Date/Time   CHOL 156 03/24/2020 0450   CHOL 163 11/15/2019 0859   TRIG 618 (H) 03/24/2020 0450   TRIG 605 (H) 03/24/2020 0450   TRIG 456 (H) 07/03/2013 1504   HDL 27 (L) 03/24/2020 0450   HDL 33 (L) 11/15/2019 0859   HDL 36 (L) 07/03/2013 1504   CHOLHDL 5.8 03/24/2020 0450   VLDL UNABLE TO CALCULATE IF TRIGLYCERIDE OVER 400 mg/dL 35/57/322006/14/2021 25420450   LDLCALC UNABLE TO CALCULATE IF TRIGLYCERIDE OVER 400 mg/dL 70/62/376206/14/2021 83150450   LDLCALC 83 11/15/2019 0859   LDLCALC NOTES: 07/03/2013 1504   HgbA1c:  Lab Results  Component Value Date   HGBA1C 8.9 (H) 03/24/2020   Urine Drug Screen: No results found for: LABOPIA, COCAINSCRNUR,  LABBENZ, AMPHETMU, THCU, LABBARB  Alcohol Level No results found for: ETH  IMAGING past 24 hours CT ANGIO HEAD W OR WO CONTRAST  Result Date: 03/23/2020 CLINICAL DATA:  Left arm weakness and facial droop EXAM: CT ANGIOGRAPHY HEAD AND NECK TECHNIQUE: Multidetector CT imaging of the head and neck was performed using the standard protocol during bolus administration of intravenous contrast. Multiplanar CT image reconstructions and MIPs were obtained to evaluate the vascular anatomy. Carotid stenosis measurements (when applicable) are obtained utilizing NASCET criteria, using the distal internal carotid diameter as the denominator. CONTRAST:  80mL OMNIPAQUE IOHEXOL 350 MG/ML SOLN COMPARISON:  None. FINDINGS: CTA NECK FINDINGS SKELETON: There is no bony spinal canal stenosis. No lytic or  blastic lesion. OTHER NECK: Normal pharynx, larynx and major salivary glands. No cervical lymphadenopathy. Unremarkable thyroid gland. UPPER CHEST: No pneumothorax or pleural effusion. No nodules or masses. AORTIC ARCH: There is no calcific atherosclerosis of the aortic arch. There is no aneurysm, dissection or hemodynamically significant stenosis of the visualized portion of the aorta. Conventional 3 vessel aortic branching pattern. The visualized proximal subclavian arteries are widely patent. RIGHT CAROTID SYSTEM: Normal without aneurysm, dissection or stenosis. LEFT CAROTID SYSTEM: Normal without aneurysm, dissection or stenosis. VERTEBRAL ARTERIES: Left dominant configuration. Both origins are clearly patent. There is no dissection, occlusion or flow-limiting stenosis to the skull base (V1-V3 segments). CTA HEAD FINDINGS POSTERIOR CIRCULATION: --Vertebral arteries: Normal V4 segments. --Inferior cerebellar arteries: Normal. --Basilar artery: Normal. --Superior cerebellar arteries: Normal. --Posterior cerebral arteries (PCA): Normal. ANTERIOR CIRCULATION: --Intracranial internal carotid arteries: Normal. --Anterior cerebral  arteries (ACA): Normal. Both A1 segments are present. Patent anterior communicating artery (a-comm). --Middle cerebral arteries (MCA): There is a distal right MCA M1 segment occlusion. Left MCA is normal. VENOUS SINUSES: As permitted by contrast timing, patent. ANATOMIC VARIANTS: Fetal origin of the right posterior cerebral artery. There is moderate collateral flow in the right MCA territory. Review of the MIP images confirms the above findings. IMPRESSION: 1. Distal right MCA M1 segment occlusion with moderate collateral flow in the right MCA territory. 2. No other intracranial arterial occlusion or high-grade stenosis. 3. Normal cervical carotid and vertebral arteries. Critical Value/emergent results were called by telephone at the time of interpretation on 03/23/2020 at 11:13 pm to provider Samara Snide , who verbally acknowledged these results. Electronically Signed   By: Ulyses Jarred M.D.   On: 03/23/2020 23:17   CT HEAD WO CONTRAST  Result Date: 03/24/2020 CLINICAL DATA:  Status post thrombectomy EXAM: CT HEAD WITHOUT CONTRAST TECHNIQUE: Contiguous axial images were obtained from the base of the skull through the vertex without intravenous contrast. COMPARISON:  None. FINDINGS: Brain: There is no mass, hemorrhage or extra-axial collection. The size and configuration of the ventricles and extra-axial CSF spaces are normal. There is hypoattenuation of the white matter, most commonly indicating chronic small vessel disease. Vascular: Expected intravascular enhancement. Skull: The visualized skull base, calvarium and extracranial soft tissues are normal. Sinuses/Orbits: No fluid levels or advanced mucosal thickening of the visualized paranasal sinuses. No mastoid or middle ear effusion. The orbits are normal. IMPRESSION: 1. No acute intracranial abnormality. 2. Chronic small vessel disease. Electronically Signed   By: Ulyses Jarred M.D.   On: 03/24/2020 02:34   CT ANGIO NECK W OR WO CONTRAST  Result  Date: 03/23/2020 CLINICAL DATA:  Left arm weakness and facial droop EXAM: CT ANGIOGRAPHY HEAD AND NECK TECHNIQUE: Multidetector CT imaging of the head and neck was performed using the standard protocol during bolus administration of intravenous contrast. Multiplanar CT image reconstructions and MIPs were obtained to evaluate the vascular anatomy. Carotid stenosis measurements (when applicable) are obtained utilizing NASCET criteria, using the distal internal carotid diameter as the denominator. CONTRAST:  70mL OMNIPAQUE IOHEXOL 350 MG/ML SOLN COMPARISON:  None. FINDINGS: CTA NECK FINDINGS SKELETON: There is no bony spinal canal stenosis. No lytic or blastic lesion. OTHER NECK: Normal pharynx, larynx and major salivary glands. No cervical lymphadenopathy. Unremarkable thyroid gland. UPPER CHEST: No pneumothorax or pleural effusion. No nodules or masses. AORTIC ARCH: There is no calcific atherosclerosis of the aortic arch. There is no aneurysm, dissection or hemodynamically significant stenosis of the visualized portion of the aorta. Conventional 3 vessel aortic branching pattern.  The visualized proximal subclavian arteries are widely patent. RIGHT CAROTID SYSTEM: Normal without aneurysm, dissection or stenosis. LEFT CAROTID SYSTEM: Normal without aneurysm, dissection or stenosis. VERTEBRAL ARTERIES: Left dominant configuration. Both origins are clearly patent. There is no dissection, occlusion or flow-limiting stenosis to the skull base (V1-V3 segments). CTA HEAD FINDINGS POSTERIOR CIRCULATION: --Vertebral arteries: Normal V4 segments. --Inferior cerebellar arteries: Normal. --Basilar artery: Normal. --Superior cerebellar arteries: Normal. --Posterior cerebral arteries (PCA): Normal. ANTERIOR CIRCULATION: --Intracranial internal carotid arteries: Normal. --Anterior cerebral arteries (ACA): Normal. Both A1 segments are present. Patent anterior communicating artery (a-comm). --Middle cerebral arteries (MCA): There is a  distal right MCA M1 segment occlusion. Left MCA is normal. VENOUS SINUSES: As permitted by contrast timing, patent. ANATOMIC VARIANTS: Fetal origin of the right posterior cerebral artery. There is moderate collateral flow in the right MCA territory. Review of the MIP images confirms the above findings. IMPRESSION: 1. Distal right MCA M1 segment occlusion with moderate collateral flow in the right MCA territory. 2. No other intracranial arterial occlusion or high-grade stenosis. 3. Normal cervical carotid and vertebral arteries. Critical Value/emergent results were called by telephone at the time of interpretation on 03/23/2020 at 11:13 pm to provider Arther Dames , who verbally acknowledged these results. Electronically Signed   By: Deatra Robinson M.D.   On: 03/23/2020 23:17   DG CHEST PORT 1 VIEW  Result Date: 03/24/2020 CLINICAL DATA:  Endotracheal tube placement. EXAM: PORTABLE CHEST 1 VIEW COMPARISON:  August 19, 2018 FINDINGS: The endotracheal tube terminates above the carina by approximately 3 cm. The heart size is unchanged from prior study. The lung volumes are low. Aortic calcifications are noted. There is no pneumothorax. Bibasilar atelectasis is noted. IMPRESSION: 1. Endotracheal tube as above. 2. Low lung volumes with bibasilar atelectasis. Electronically Signed   By: Katherine Mantle M.D.   On: 03/24/2020 03:54   CT HEAD CODE STROKE WO CONTRAST  Result Date: 03/23/2020 CLINICAL DATA:  Code stroke.  Left arm weakness and facial droop. EXAM: CT HEAD WITHOUT CONTRAST TECHNIQUE: Contiguous axial images were obtained from the base of the skull through the vertex without intravenous contrast. COMPARISON:  None. FINDINGS: Brain: There is no mass, hemorrhage or extra-axial collection. The size and configuration of the ventricles and extra-axial CSF spaces are normal. There is hypoattenuation of the periventricular white matter, most commonly indicating chronic ischemic microangiopathy. Vascular: No  abnormal hyperdensity of the major intracranial arteries or dural venous sinuses. No intracranial atherosclerosis. Skull: The visualized skull base, calvarium and extracranial soft tissues are normal. Sinuses/Orbits: No fluid levels or advanced mucosal thickening of the visualized paranasal sinuses. No mastoid or middle ear effusion. The orbits are normal. ASPECTS [facility] Stroke Program Early CT Score) - Ganglionic level infarction (caudate, lentiform nuclei, internal capsule, insula, M1-M3 cortex): 7 - Supraganglionic infarction (M4-M6 cortex): 3 Total score (0-10 with 10 being normal): 10 IMPRESSION: 1. No acute intracranial abnormality 2. ASPECTS is 10. These results were communicated to Dr. Georgiana Spinner Aroor at 10:41 pm on 03/23/2020 by text page via the [facility] messaging system. Electronically Signed   By: Deatra Robinson M.D.   On: 03/23/2020 22:41    PHYSICAL EXAM  Temp:  [98.2 F (36.8 C)-99.9 F (37.7 C)] 99.9 F (37.7 C) (06/14 0800) Pulse Rate:  [76-113] 97 (06/14 0900) Resp:  [13-26] 16 (06/14 0900) BP: (92-187)/(58-157) 112/70 (06/14 0900) SpO2:  [94 %-100 %] 100 % (06/14 0900) Arterial Line BP: (110-198)/(53-89) 133/59 (06/14 0900) FiO2 (%):  [40 %-100 %] 40 % (06/14  3614) Weight:  [77 kg] 77 kg (06/13 2233)  General - Well nourished, well developed, intubated on low dose sedation.  Ophthalmologic - fundi not visualized due to noncooperation.  Cardiovascular - Regular rhythm but mild tachycardia.  Neuro - intubated on low dose propofol, eyes close but open on voice, following all simple commands. With eye opening, eyes in right gaze preference position, barely cross midline, blinking to visual threat on the right but not on the left, tracking on the right, PERRL. Corneal reflex present bilaterally, gag and cough present. Breathing over the vent.  Facial symmetry not able to test due to ET tube.  Tongue protrusion not cooperative. Spontaneous movement of RUE and RLE against gravity,  purposeful. No spontaneous movement of LUE and LLE. On pain stimulation, mild withdraw of LUE and LLE. DTR 1+ and no babinski. Sensation, coordination and gait not tested.   ASSESSMENT/PLAN Ms. PAIZLEIGH WILDS is a 74 y.o. female with history of HTN, HLD, DB presenting with slurred speech, L sided weakness and ataxia. Received IV tPA 03/23/2020 at 2252. Worsening in ED. CTA confirmed distal R M1 occlusion. Taken to IR.    Stroke:  R MCA infarct s/p tPA and IR w/ TICI3 revascularization, infarct embolic secondary to unknown source  Code Stroke CT head No acute abnormality. ASPECTS 10.     CTA head & neck distal R M2 occlusion w/ moderate collateral flow R MCA territory.   Cerebral angio angio w/ TICI3 revascularization R M1 occlusion  W/ embotrap, solitaire, penumbra  Post IR CT no ICH or mass effect   CT head no acute abnormality. Small vessel disease.   MRI  pending  MRA  pending  LE Doppler  No DVT  2D Echo EF 70-75%. No source of embolus   TG>500. Direct LDL 63.8  HgbA1c 8.9  SCDs for VTE prophylaxis  aspirin 81 mg daily prior to admission, now on No antithrombotic s/p tPA within 24 hours  Therapy recommendations:  pending   Disposition:  pending   Acute Respiratory Failure  Intubated for IR, left intubated d/t slow response to reversal and involuntary flapping of RUE   Weaning  Hope for extubation today  CCM onboard  Hypertension  Home meds:  amlodipine 5, lisinopril 20, metoprolol 100 bid  . On Cleviprex . BP goal 120-140 for 24hrs following IR procedure  . Long-term BP goal normotensive  Hyperlipidemia  Home meds:  pravachol 40  TG>500. Direct LDL 63.8, goal < 70  Add statin once po access  Continue statin at discharge  Diabetes type II Uncontrolled  Home meds:  trulicity 0.75, glimepiride 1, metformin 500 bid, ozempic 0.25-0.5  HgbA1c 8.9, goal < 7.0  On lantus 12u daily  CBGs  SSI  Dysphagia . Secondary to stroke . NPO . Speech  on board   Other Stroke Risk Factors  Advanced age  Family hx stroke (father)  Other Active Problems  Leukocytosis, WBC 12.2  CRF, Cre 1.6->1.54  Hospital day # 0  This patient is critically ill due to right MCA stroke status post TPA, right MCA occlusion status post EVT, respiratory failure, tachycardia and at significant risk of neurological worsening, death form recurrent stroke, hemorrhagic conversion, cerebral edema, brain herniation, heart failure, seizure. This patient's care requires constant monitoring of vital signs, hemodynamics, respiratory and cardiac monitoring, review of multiple databases, neurological assessment, discussion with family, other specialists and medical decision making of high complexity. I spent 40 minutes of neurocritical care time in the care of  this patient.  I have discussed with Dr. Marchelle Gearing.  Marvel Plan, MD PhD Stroke Neurology 03/24/2020 7:46 PM   To contact Stroke Continuity provider, please refer to [email or web address]. After hours, contact General Neurology

## 2020-03-24 NOTE — Procedures (Signed)
Extubation Procedure Note  Patient Details:   [name] DOB: May 20, 1946 MRN: [id number]   Airway Documentation:    Vent end date: 03/24/20 Vent end time: 1040   Evaluation  O2 sats: stable throughout Complications: No apparent complications Patient did tolerate procedure well. Bilateral Breath Sounds: Clear   Yes   Patient extubated to 4lnc. Vital signs stable at this time. No complications. RN at bedside. RT will continue to monitor.  Ave Filter 03/24/2020, 10:44 AM

## 2020-03-24 NOTE — Consult Note (Signed)
NAME:  Meredith Shaw, MRN:  [id number], DOB:  01-Aug-1946, LOS: 0 ADMISSION DATE:  03/23/2020, CONSULTATION DATE: 03/24/2020 REFERRING MD:  Aroor, CHIEF COMPLAINT: Right MCA M1 stroke  Brief History   74 year old female presented as code stroke, CT head negative, CTA showed right MCA M1 segment occlusion.  Status post TPA and stentriever on 03/24/2020.  History of present illness   74 year old female with past medical history of diabetes, hypertension who was doing well all day on Sunday.  Husband noted at 8:45 PM that patient has slurry speech.  Patient called his son who arrived in 15 minutes and noticed the facial droop, slurred speech as well as left upper extremity weakness.  The brought her to emergency department via POV.  History was taken from son and husband at the bedside who reported patient did not have any other symptoms throughout the day including fever, chills, nausea, vomiting, headache.  She also does not have any prior history of CVAs, CAD.  In the ED her vital signs were unremarkable, lab work-up showed blood glucose 316 with anion gap of 13, creatinine 1.70, normal LFTs and CBC.  INR was 1.0.  Screening SARS-CoV-2 negative.  CT head was negative CTA showed distal right MCA M1 segment occlusion with moderate collateral flow.  Patient received TPA and afterwards thrombectomy via stentriever by IR.  Patient was seen and examined in the neuro ICU after the procedure, she remains intubated and on propofol 50.  She appears to be saturating well and her FiO2 was decreased to 50%.  ABG shows 7.3 4/38/157 on FiO2 50%.  No history of tobacco use or any lung parenchymal disease, never used any inhalers.  Past Medical History  Hypertension, diabetes, hypothyroidism, hypertensive retinopathy  Significant Hospital Events   tPA movement at 2252 on 03/23/2020 S/P RT CCA angiogram followed by complete revascularization of occluded RT MCA M 1 seg with x 1 pass with 77m x 37 mm embotrap  retriver and x 1 pass with [id number] 40 mm X retriver and penumbra aspiration  Achieving a TICI 3 revascularization.  Consults:  Neurology Interventional radiology  Procedures:  S/P RT CCA angiogram followed by complete revascularization of occluded RT MCA M 1 seg with x 1 pass with 26m68m 37 mm embotrap retriver and x 1 pass with [id number] mm X retriver and penumbra aspiration  Achieving a TICI 3 revascularization.  Significant Diagnostic Tests:  CT head 6/13: No acute intracranial abnormality CT angio head and neck:  1. Distal right MCA M1 segment occlusion with moderate collateral flow in the right MCA territory. 2. No other intracranial arterial occlusion or high-grade stenosis. 3. Normal cervical carotid and vertebral arteries  Micro Data:    Antimicrobials:  none  Interim history/subjective:    Objective   Blood pressure 140/73, pulse 90, temperature 98.2 F (36.8 C), temperature source Oral, resp. rate (!) 21, weight 77 kg, SpO2 100 %.    Vent Mode: PRVC FiO2 (%):  [40 %-100 %] 40 % Set Rate:  [15 bmp] 15 bmp Vt Set:  [500 mL] 500 mL PEEP:  [5 cmH20] 5 cmH20 Plateau Pressure:  [18 cmH20] 18 cmH20   Intake/Output Summary (Last 24 hours) at 03/24/2020 0359 Last data filed at 03/24/2020 0202 Gross per 24 hour  Intake 1250 ml  Output --  Net 1250 ml   Filed Weights   03/23/20 2233  Weight: 77 kg    Examination: General: Intubated and sedated with propofol, appears comfortable on  ventilator HENT: No obvious deformity, unable to assess EOM, pupils are equal and reactive Lungs: Clear to auscultation bilaterally, no wheezing or rhonchi Cardiovascular: S1 plus S2 +0 Abdomen: Soft nontender nondistended Extremities: Dorsalis pedis palpable in both lower extremities Neuro: Patient is initiating breaths on ventilator, unable to assess EOM, pupils are equal and reactive to light, unable to assess the rest. GU:   Resolved Hospital Problem list      Assessment & Plan:  74 year old female with past medical history of hypertension, diabetes, hypertensive retinopathy was brought to emergency department by POV 10 PM after she was noted to have slurring of speech, left facial droop and left upper extremity weakness the time of onset at 8:45 PM.  Status post TPA.  CT head and neck showed right MCA M1 occlusion and patient underwent stentriever by IR.  Currently she is intubated as she was slow to wake up after the procedure.  Currently on Cardene drip pressure monitoring with arterial line.  Need for mechanical ventilation post procedure:  Likely slow to wake up from anesthesia No known lung parenchymal disease with normal exam Requiring minimal vent support with reassuring oxygenation and ventilation 7.34/38/157 on FiO2 50% RASS goal of 0, will use fentanyl as needed and propofol gtt. SAT/SBT this a.m. depending on her mental status.  Right MCA stroke: Status post TPA, stentriever by IR Clinically blood pressure goal is being directed by the radiology, using Cleviprex Management per neurology  Diabetes;  Check HbA1C Hold metformin and start ISS for now.  Best practice:  Diet: NPO for now Pain/Anxiety/Delirium protocol (if indicated): Fentanyl prn, propofol VAP protocol (if indicated): ordered DVT prophylaxis: per primary team GI prophylaxis: Protonix Glucose control: Insulin SS Mobility: with PT once extubated Code Status: Full Family Communication: d/w Son and husband at bedside Disposition: remains in Neuro ICU  Labs   CBC: Recent Labs  Lab 03/23/20 2227 03/23/20 2229 03/24/20 0337  WBC 9.4  --   --   NEUTROABS 6.5  --   --   HGB 13.4 12.9 11.2*  HCT 40.8 38.0 33.0*  MCV 89.7  --   --   PLT 222  --   --     Basic Metabolic Panel: Recent Labs  Lab 03/23/20 2227 03/23/20 2229 03/24/20 0337  NA 138 142 139  K 4.2 4.2 3.8  CL 104 105  --   CO2 21*  --   --   GLUCOSE 316* 302*  --   BUN 41* 39*  --    CREATININE 1.70* 1.60*  --   CALCIUM 9.3  --   --    GFR: Estimated Creatinine Clearance: 33.5 mL/min (A) (by C-G formula based on SCr of 1.6 mg/dL (H)). Recent Labs  Lab 03/23/20 2227  WBC 9.4    Liver Function Tests: Recent Labs  Lab 03/23/20 2227  AST 15  ALT 15  ALKPHOS 54  BILITOT 0.9  PROT 6.9  ALBUMIN 3.4*   No results for input(s): LIPASE, AMYLASE in the last 168 hours. No results for input(s): AMMONIA in the last 168 hours.  ABG    Component Value Date/Time   PHART 7.346 (L) 03/24/2020 0337   PCO2ART 38.7 03/24/2020 0337   PO2ART 157 (H) 03/24/2020 0337   HCO3 21.2 03/24/2020 0337   TCO2 22 03/24/2020 0337   ACIDBASEDEF 4.0 (H) 03/24/2020 0337   O2SAT 99.0 03/24/2020 0337     Coagulation Profile: Recent Labs  Lab 03/23/20 2227  INR 1.0  Cardiac Enzymes: No results for input(s): CKTOTAL, CKMB, CKMBINDEX, TROPONINI in the last 168 hours.  HbA1C: HB A1C (BAYER DCA - WAIVED)  Date/Time Value Ref Range Status  02/21/2020 09:09 AM 9.4 (H) <7.0 % Final    Comment:                                          Diabetic Adult            <7.0                                       Healthy Adult        4.3 - 5.7                                                           (DCCT/NGSP) American Diabetes Association's Summary of Glycemic Recommendations for Adults with Diabetes: Hemoglobin A1c <7.0%. More stringent glycemic goals (A1c <6.0%) may further reduce complications at the cost of increased risk of hypoglycemia.   11/15/2019 08:59 AM 9.3 (H) <7.0 % Final    Comment:                                          Diabetic Adult            <7.0                                       Healthy Adult        4.3 - 5.7                                                           (DCCT/NGSP) American Diabetes Association's Summary of Glycemic Recommendations for Adults with Diabetes: Hemoglobin A1c <7.0%. More stringent glycemic goals (A1c <6.0%) may further reduce  complications at the cost of increased risk of hypoglycemia.     CBG: No results for input(s): GLUCAP in the last 168 hours.  Review of Systems:   A complete review of systems done with the help of her husband and son at the bedside which was negative. Patient is unable to participate in the review of system due to mechanical ventilation and sedation.  Past Medical History  She,  has a past medical history of Arthritis, Cataract, Diabetes mellitus, Hypertension, Hypertensive retinopathy, Hypothyroidism, Kidney stones, and Osteoporosis.   Surgical History    Past Surgical History:  Procedure Laterality Date  . ABDOMINAL HYSTERECTOMY    . CATARACT EXTRACTION    . CHOLECYSTECTOMY  1972  . COLONOSCOPY    . EYE SURGERY     RIGHT CATARACT EXTRACTON WITH LENS IMPLANT  . GROWTH REMOVED FROM THYROID    . INTRAMEDULLARY (IM) NAIL INTERTROCHANTERIC Left 02/10/2018   Procedure:  INTRAMEDULLARY (IM) NAIL INTERTROCHANTRIC;  Surgeon: Mcarthur Rossetti, MD;  Location: [facility];  Service: Orthopedics;  Laterality: Left;  . INTRAMEDULLARY (IM) NAIL INTERTROCHANTERIC Right 08/20/2018   Procedure: ORIF RIGHT HIP INTERTROC FRACTURE;  Surgeon: Mcarthur Rossetti, MD;  Location: [facility];  Service: Orthopedics;  Laterality: Right;  . left hip surgery   02/2018  . ORIF HIP FRACTURE Right 08/20/2018  . PERCUTANEOUS NEPHROLITHOTOMY    . TOTAL KNEE ARTHROPLASTY  06/09/2012   Procedure: TOTAL KNEE ARTHROPLASTY;  Surgeon: Mcarthur Rossetti, MD;  Location: [facility];  Service: Orthopedics;  Laterality: Right;  Right Total Knee Arthroplasty  . TOTAL KNEE ARTHROPLASTY Left 04/09/2014   Procedure: LEFT TOTAL KNEE ARTHROPLASTY;  Surgeon: Mcarthur Rossetti, MD;  Location: [facility];  Service: Orthopedics;  Laterality: Left;  . TUBAL LIGATION       Social History   reports that she has never smoked. She has never used smokeless tobacco. She reports that she does not drink alcohol and does not use drugs.    Family History   Her family history includes Breast cancer in her mother; Diabetes in her paternal grandmother; Healthy in her daughter and son; Heart disease in her father and mother; Liver disease in her maternal grandmother; Pneumonia in her maternal grandfather; Stroke in her father.   Allergies Allergies  Allergen Reactions  . Semaglutide Nausea And Vomiting    PO Rybelsus  . Farxiga [Dapagliflozin]     Yeast infections  . Penicillins Itching and Rash     Home Medications  Prior to Admission medications   Medication Sig Start Date End Date Taking? Authorizing Provider  amLODipine (NORVASC) 5 MG tablet TAKE ONE (1) TABLET EACH DAY 01/09/20   Baruch Gouty, FNP  aspirin EC 81 MG tablet Take 81 mg by mouth daily.    [provider]  Blood Glucose Monitoring Suppl (ONETOUCH VERIO) w/Device KIT 1 each by Does not apply route daily. 03/10/18   Eustaquio Maize, MD  cholecalciferol (VITAMIN D) 1000 units tablet Take 5,000 Units by mouth daily.     [provider]  denosumab (PROLIA) 60 MG/ML SOSY injection Inject 60 mg into the skin every 6 (six) months. 11/15/18   Baruch Gouty, FNP  Dulaglutide (TRULICITY) 2.45 YK/9.9IP SOPN Inject 0.75 mg into the skin.    [provider]  fexofenadine (ALLEGRA) 180 MG tablet 1 by mouth q day 12/27/05   [provider]  glimepiride (AMARYL) 1 MG tablet 1 by mouth q day 12/27/05   [provider]  glucose blood (ONETOUCH VERIO) test strip Use as instructed 03/10/18   Eustaquio Maize, MD  ibuprofen (ADVIL) 800 MG tablet 1 po bid with food 05/12/05   [provider]  Lancets Glory Rosebush ULTRASOFT) lancets Use as instructed 03/10/18   Eustaquio Maize, MD  levothyroxine (SYNTHROID) 125 MCG tablet Take 1 tablet (125 mcg total) by mouth daily. 11/19/19   Baruch Gouty, FNP  lisinopril (ZESTRIL) 20 MG tablet Take 1 tablet (20 mg total) by mouth daily. 03/04/20   Hassell Done Mary-Margaret, FNP  metFORMIN (GLUCOPHAGE)  500 MG tablet Take 1 tablet (500 mg total) by mouth 2 (two) times daily with a meal. 02/26/20 05/26/20  Dettinger, Fransisca Kaufmann, MD  metoprolol tartrate (LOPRESSOR) 100 MG tablet TAKE ONE TABLET BY MOUTH TWICE DAILY 01/09/20   Rakes, Connye Burkitt, FNP  naproxen sodium (ALEVE) 220 MG tablet Take 220 mg by mouth 2 (two) times daily as needed (for pain).  [provider]  pravastatin (PRAVACHOL) 40 MG tablet Take 1 tablet (40 mg total) by mouth at bedtime. 08/15/19   Baruch Gouty, FNP  Semaglutide,0.25 or 0.5MG/DOS, (OZEMPIC, 0.25 OR 0.5 MG/DOSE,) 2 MG/1.5ML SOPN Inject into the skin.    [provider]     Critical care time: 35 min

## 2020-03-24 NOTE — Progress Notes (Signed)
eLink Physician-Brief Progress Note Patient [name] DOB: Apr 04, 1946 MRN: [id number]   Date of Service  03/24/2020  HPI/Events of Note  Nursing reports that the patient is less responsive than earlier in the day. Poor cough and poor secretion mobilization. Increased O2 requirement (from 2 L/min --> 4 L/min). Last ABG at 7 PM looks OK. Episode of bradycardia earlier in the shift. Now with irregular HR with ventricular rate = 120.  eICU Interventions  Plan: 1. Will ask ground team to evaluate the patient at bedside.      Intervention Category Major Interventions: Change in mental status - evaluation and management Intermediate Interventions: Other:  Lenell Antu 03/24/2020, 8:44 PM

## 2020-03-24 NOTE — Progress Notes (Signed)
OT Cancellation Note  Patient Details [name] MRN: [id number] DOB: 23-Aug-1946   Cancelled Treatment:    Reason Eval/Treat Not Completed: Active bedrest order.  Will reattempt.  Eber Jones., OTR/L Acute Rehabilitation Services Pager [phone] Office [phone]   Jeani Hawking M 03/24/2020, 1:24 PM

## 2020-03-24 NOTE — Progress Notes (Signed)
Patient was transported from CT to 4N20 on the ventilator with no complications.

## 2020-03-24 NOTE — Progress Notes (Signed)
Patient ID: Meredith Shaw, female   DOB: [birth year], 74 y.o.   MRN: [id number] INR. 73  Y RT H F MRSS 0  LSW 850 pm. New onset of Lt sided weakness , facial droop.  CT brain NO ICH . ASPECTS 10 . CTA occluded RT MCA distal M1 seg.  received TPA.  Endovascular treatment to revascularize RT MCA D/W son and husband.Reasons,procedure and alternatives were reviewed . Risks of ICH of 10 % ,worsening neuro condition death and inability to revascularize reviewed. The both expressed understanding . Patient had provide informed consent prior to the procedure. S.Dontravious Camille MD Repeat CT brain. No ICH or mass effect seen. S.Constantina Laseter MD

## 2020-03-24 NOTE — ED Notes (Signed)
Pt returned from CT, pt placed on purewick, noted small amount of red colored urine, pt states she has been having this, that she is due to see the kidney doctor for evaluation. States she also has kidney stones.

## 2020-03-24 NOTE — Procedures (Signed)
S/P RT CCA angiogram followed by complete revascularization of occluded RT MCA M 1 seg with x 1 pass with 22mm x 37 mm embotrap retriver and x 1 pass with solitaire60mm x 40 mm X retriver and penumbra aspiration  Achieving a TICI 3 revascularization. Post CT brain neg for ICH or mass effect. RT CFA puncture  closed with an  69F angioseal.RT groin soft . Distal pulses DP s palpable RT > LT . Both PTs dopplerable.. Patient left intubated as slow to respond after gas reversal and involuntary flapping of her  RT upper extremity. Minimal movement noted in the Lt LE.Marland Kitchen Pupils 89mm R briskly reactive. \Will obtain CT brain to evaluate for changes. D/W Dr Malena Peer. S.Roseana Rhine MD

## 2020-03-24 NOTE — Sedation Documentation (Signed)
Groin and pulses assessed at bedside upon arrival to 4N20 with Gardiner Ramus, RN. No change, see flowsheet.

## 2020-03-24 NOTE — H&P (Signed)
Chief Complaint: Left-sided weakness and slurred speech  History obtained from: Patient and Chart  HPI:                                                                                                                                       Meredith Shaw is a 74 y.o. female with past medical history significant for diabetes mellitus, hypertension, hypothyroidism brought to the emergency department by her son for sudden onset slurred speech and left-sided weakness. Code stroke initiated by EDP on triage.  The patient was her normal self until 8:50 PM when she suddenly felt slurred speech, left-sided weakness.  Her husband called patient's son prior to the emergency department.  On initial evaluation patient's NIH stroke scale was 5 for slurred speech, left facial droop, left upper extremity weakness and ataxia and left lower extremity drift.  At the time no obvious neglect was appreciated.  CT head was obtained showed no acute findings, ASPECTS 10-TPA was administered after obtaining the patient's consent.  Upon reassessment, patient's NIHSS worsened to 9 due to obvious visual and sensory hemineglect.  CT angiogram confirmed right distal M1 occlusion and patient was taken for mechanical thrombectomy.    Date last known well: 03/23/2020 Time last known well: 8:50 PM tPA Given: Yes NIHSS: 5>>9 Baseline MRS 0   Past Medical History:  Diagnosis Date   Arthritis    OA AND PAIN BOTH KNEES -RIGHT KNEE HURTS WORSE THAN LEFT   Cataract    Diabetes mellitus    ORAL MEDICATION - NO INSULIN   Hypertension    Hypertensive retinopathy    OU   Hypothyroidism    Kidney stones    NONE AT PRESENT TIME THAT PT IS AWARE OF   Osteoporosis     Past Surgical History:  Procedure Laterality Date   ABDOMINAL HYSTERECTOMY     CATARACT EXTRACTION     CHOLECYSTECTOMY  1972   COLONOSCOPY     EYE SURGERY     RIGHT CATARACT EXTRACTON WITH LENS IMPLANT   GROWTH REMOVED FROM THYROID     INTRAMEDULLARY  (IM) NAIL INTERTROCHANTERIC Left 02/10/2018   Procedure: INTRAMEDULLARY (IM) NAIL INTERTROCHANTRIC;  Surgeon: Kathryne Hitch, MD;  Location: [facility] OR;  Service: Orthopedics;  Laterality: Left;   INTRAMEDULLARY (IM) NAIL INTERTROCHANTERIC Right 08/20/2018   Procedure: ORIF RIGHT HIP INTERTROC FRACTURE;  Surgeon: Kathryne Hitch, MD;  Location: [facility] OR;  Service: Orthopedics;  Laterality: Right;   left hip surgery   02/2018   ORIF HIP FRACTURE Right 08/20/2018   PERCUTANEOUS NEPHROLITHOTOMY     TOTAL KNEE ARTHROPLASTY  06/09/2012   Procedure: TOTAL KNEE ARTHROPLASTY;  Surgeon: Kathryne Hitch, MD;  Location: [facility];  Service: Orthopedics;  Laterality: Right;  Right Total Knee Arthroplasty   TOTAL KNEE ARTHROPLASTY Left 04/09/2014   Procedure: LEFT TOTAL KNEE ARTHROPLASTY;  Surgeon: Kathryne Hitch, MD;  Location: [facility] OR;  Service: Orthopedics;  Laterality: Left;   TUBAL LIGATION      Family History  Problem Relation Age of Onset   Heart disease Mother    Breast cancer Mother        9360's   Stroke Father    Heart disease Father    Healthy Daughter    Healthy Son    Liver disease Maternal Grandmother        gall stones imbedded in liver - blead to death when in surgery    Pneumonia Maternal Grandfather    Diabetes Paternal Grandmother    Social History:  reports that she has never smoked. She has never used smokeless tobacco. She reports that she does not drink alcohol and does not use drugs.  Allergies:  Allergies  Allergen Reactions   Semaglutide Nausea And Vomiting    PO Rybelsus   Farxiga [Dapagliflozin]     Yeast infections   Penicillins Itching and Rash    Medications:                                                                                                                        I reviewed home medications   ROS:                                                                                                                                      14 systems reviewed and negative except above   Examination:                                                                                                      General: Appears well-developed  Psych: Affect appropriate to situation Eyes: No scleral injection HENT: No OP obstrucion Head: Normocephalic.  Cardiovascular: Normal rate and regular rhythm. Respiratory: Effort normal and breath sounds normal to anterior ascultation GI: Soft.  No distension. There is no tenderness.  Skin: WDI    Neurological Examination Mental Status: Alert, oriented, thought  content appropriate.  Speech fluent without evidence of aphasia.  Mild dysarthria.  Able to follow 3 step commands without difficulty. Cranial Nerves: II: Visual fields : Left homonymous hemianopsia III,IV, VI: ptosis not present, extra-ocular motions intact bilaterally, pupils equal, round, reactive to light and accommodation V,VII: Left facial droop, facial light touch sensation normal bilaterally VIII: hearing normal bilaterally IX,X: uvula rises symmetrically XI: bilateral shoulder shrug XII: midline tongue extension Motor: Right : Upper extremity   5/5    Left:     Upper extremity   4/5, pronator drift  Lower extremity   5/5     Lower extremity   4/5 Tone and bulk:normal tone throughout; no atrophy noted Sensory: Sensory neglect to light touch on the left upper and lower extremity Deep Tendon Reflexes: 2+ and symmetric throughout Plantars: Right: downgoing   Left: downgoing Cerebellar: Impaired finger-to-nose and left upper extremity Gait: normal gait and station     Lab Results: Basic Metabolic Panel: Recent Labs  Lab 03/23/20 Feb 18, 2226 03/23/20 02/19/2228  NA 138 142  K 4.2 4.2  CL 104 105  CO2 21*  --   GLUCOSE 316* 302*  BUN 41* 39*  CREATININE 1.70* 1.60*  CALCIUM 9.3  --     CBC: Recent Labs  Lab 03/23/20 Feb 18, 2226 03/23/20 2229  WBC 9.4  --   NEUTROABS 6.5  --   HGB 13.4 12.9  HCT 40.8 38.0  MCV 89.7  --    PLT 222  --     Coagulation Studies: Recent Labs    03/23/20 Feb 18, 2226  LABPROT 12.9  INR 1.0    Imaging: CT ANGIO HEAD W OR WO CONTRAST  Result Date: 03/23/2020 CLINICAL DATA:  Left arm weakness and facial droop EXAM: CT ANGIOGRAPHY HEAD AND NECK TECHNIQUE: Multidetector CT imaging of the head and neck was performed using the standard protocol during bolus administration of intravenous contrast. Multiplanar CT image reconstructions and MIPs were obtained to evaluate the vascular anatomy. Carotid stenosis measurements (when applicable) are obtained utilizing NASCET criteria, using the distal internal carotid diameter as the denominator. CONTRAST:  45mL OMNIPAQUE IOHEXOL 350 MG/ML SOLN COMPARISON:  None. FINDINGS: CTA NECK FINDINGS SKELETON: There is no bony spinal canal stenosis. No lytic or blastic lesion. OTHER NECK: Normal pharynx, larynx and major salivary glands. No cervical lymphadenopathy. Unremarkable thyroid gland. UPPER CHEST: No pneumothorax or pleural effusion. No nodules or masses. AORTIC ARCH: There is no calcific atherosclerosis of the aortic arch. There is no aneurysm, dissection or hemodynamically significant stenosis of the visualized portion of the aorta. Conventional 3 vessel aortic branching pattern. The visualized proximal subclavian arteries are widely patent. RIGHT CAROTID SYSTEM: Normal without aneurysm, dissection or stenosis. LEFT CAROTID SYSTEM: Normal without aneurysm, dissection or stenosis. VERTEBRAL ARTERIES: Left dominant configuration. Both origins are clearly patent. There is no dissection, occlusion or flow-limiting stenosis to the skull base (V1-V3 segments). CTA HEAD FINDINGS POSTERIOR CIRCULATION: --Vertebral arteries: Normal V4 segments. --Inferior cerebellar arteries: Normal. --Basilar artery: Normal. --Superior cerebellar arteries: Normal. --Posterior cerebral arteries (PCA): Normal. ANTERIOR CIRCULATION: --Intracranial internal carotid arteries: Normal.  --Anterior cerebral arteries (ACA): Normal. Both A1 segments are present. Patent anterior communicating artery (a-comm). --Middle cerebral arteries (MCA): There is a distal right MCA M1 segment occlusion. Left MCA is normal. VENOUS SINUSES: As permitted by contrast timing, patent. ANATOMIC VARIANTS: Fetal origin of the right posterior cerebral artery. There is moderate collateral flow in the right MCA territory. Review of the MIP images confirms the above findings. IMPRESSION: 1. Distal  right MCA M1 segment occlusion with moderate collateral flow in the right MCA territory. 2. No other intracranial arterial occlusion or high-grade stenosis. 3. Normal cervical carotid and vertebral arteries. Critical Value/emergent results were called by telephone at the time of interpretation on 03/23/2020 at 11:13 pm to provider Arther Dames , who verbally acknowledged these results. Electronically Signed   By: Deatra Robinson M.D.   On: 03/23/2020 23:17   CT HEAD WO CONTRAST  Result Date: 03/24/2020 CLINICAL DATA:  Status post thrombectomy EXAM: CT HEAD WITHOUT CONTRAST TECHNIQUE: Contiguous axial images were obtained from the base of the skull through the vertex without intravenous contrast. COMPARISON:  None. FINDINGS: Brain: There is no mass, hemorrhage or extra-axial collection. The size and configuration of the ventricles and extra-axial CSF spaces are normal. There is hypoattenuation of the white matter, most commonly indicating chronic small vessel disease. Vascular: Expected intravascular enhancement. Skull: The visualized skull base, calvarium and extracranial soft tissues are normal. Sinuses/Orbits: No fluid levels or advanced mucosal thickening of the visualized paranasal sinuses. No mastoid or middle ear effusion. The orbits are normal. IMPRESSION: 1. No acute intracranial abnormality. 2. Chronic small vessel disease. Electronically Signed   By: Deatra Robinson M.D.   On: 03/24/2020 02:34   CT ANGIO NECK W OR WO  CONTRAST  Result Date: 03/23/2020 CLINICAL DATA:  Left arm weakness and facial droop EXAM: CT ANGIOGRAPHY HEAD AND NECK TECHNIQUE: Multidetector CT imaging of the head and neck was performed using the standard protocol during bolus administration of intravenous contrast. Multiplanar CT image reconstructions and MIPs were obtained to evaluate the vascular anatomy. Carotid stenosis measurements (when applicable) are obtained utilizing NASCET criteria, using the distal internal carotid diameter as the denominator. CONTRAST:  35mL OMNIPAQUE IOHEXOL 350 MG/ML SOLN COMPARISON:  None. FINDINGS: CTA NECK FINDINGS SKELETON: There is no bony spinal canal stenosis. No lytic or blastic lesion. OTHER NECK: Normal pharynx, larynx and major salivary glands. No cervical lymphadenopathy. Unremarkable thyroid gland. UPPER CHEST: No pneumothorax or pleural effusion. No nodules or masses. AORTIC ARCH: There is no calcific atherosclerosis of the aortic arch. There is no aneurysm, dissection or hemodynamically significant stenosis of the visualized portion of the aorta. Conventional 3 vessel aortic branching pattern. The visualized proximal subclavian arteries are widely patent. RIGHT CAROTID SYSTEM: Normal without aneurysm, dissection or stenosis. LEFT CAROTID SYSTEM: Normal without aneurysm, dissection or stenosis. VERTEBRAL ARTERIES: Left dominant configuration. Both origins are clearly patent. There is no dissection, occlusion or flow-limiting stenosis to the skull base (V1-V3 segments). CTA HEAD FINDINGS POSTERIOR CIRCULATION: --Vertebral arteries: Normal V4 segments. --Inferior cerebellar arteries: Normal. --Basilar artery: Normal. --Superior cerebellar arteries: Normal. --Posterior cerebral arteries (PCA): Normal. ANTERIOR CIRCULATION: --Intracranial internal carotid arteries: Normal. --Anterior cerebral arteries (ACA): Normal. Both A1 segments are present. Patent anterior communicating artery (a-comm). --Middle cerebral  arteries (MCA): There is a distal right MCA M1 segment occlusion. Left MCA is normal. VENOUS SINUSES: As permitted by contrast timing, patent. ANATOMIC VARIANTS: Fetal origin of the right posterior cerebral artery. There is moderate collateral flow in the right MCA territory. Review of the MIP images confirms the above findings. IMPRESSION: 1. Distal right MCA M1 segment occlusion with moderate collateral flow in the right MCA territory. 2. No other intracranial arterial occlusion or high-grade stenosis. 3. Normal cervical carotid and vertebral arteries. Critical Value/emergent results were called by telephone at the time of interpretation on 03/23/2020 at 11:13 pm to provider Arther Dames , who verbally acknowledged these results. Electronically Signed  By: Deatra Robinson M.D.   On: 03/23/2020 23:17   CT HEAD CODE STROKE WO CONTRAST  Result Date: 03/23/2020 CLINICAL DATA:  Code stroke.  Left arm weakness and facial droop. EXAM: CT HEAD WITHOUT CONTRAST TECHNIQUE: Contiguous axial images were obtained from the base of the skull through the vertex without intravenous contrast. COMPARISON:  None. FINDINGS: Brain: There is no mass, hemorrhage or extra-axial collection. The size and configuration of the ventricles and extra-axial CSF spaces are normal. There is hypoattenuation of the periventricular white matter, most commonly indicating chronic ischemic microangiopathy. Vascular: No abnormal hyperdensity of the major intracranial arteries or dural venous sinuses. No intracranial atherosclerosis. Skull: The visualized skull base, calvarium and extracranial soft tissues are normal. Sinuses/Orbits: No fluid levels or advanced mucosal thickening of the visualized paranasal sinuses. No mastoid or middle ear effusion. The orbits are normal. ASPECTS [facility] Stroke Program Early CT Score) - Ganglionic level infarction (caudate, lentiform nuclei, internal capsule, insula, M1-M3 cortex): 7 - Supraganglionic infarction  (M4-M6 cortex): 3 Total score (0-10 with 10 being normal): 10 IMPRESSION: 1. No acute intracranial abnormality 2. ASPECTS is 10. These results were communicated to Dr. Georgiana Spinner Jacalynn Buzzell at 10:41 pm on 03/23/2020 by text page via the [facility] messaging system. Electronically Signed   By: Deatra Robinson M.D.   On: 03/23/2020 22:41     ASSESSMENT AND PLAN   74 y.o. female with past medical history significant for diabetes mellitus, hypertension, hypothyroidism brought to the emergency department by her son for sudden onset slurred speech and left-sided weakness.    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   #Admit to neuro ICU  # MRI of the brain without contrast #Transthoracic Echo  #No antiplatelets for 24 hours post TPA, repeat CT head in 24 hours to rule out bleed #Atorvastatin 40 mg daily # BP goal: 120 -140 systolic blood pressure # HBAIC and Lipid profile # Telemetry monitoring # Frequent neuro checks #stroke swallow screen  Acute respiratory failure -Management per PCCM  Hypertension -BP goal of 10/31/1938 systolic post IR  Type 2 diabetes mellitus -Hold Metformin -Sliding scale insulin  DVT prophylaxis: SCD Full code    This patient is neurologically critically ill due to acute ischemic stroke s/p tPA and EMT.  He is at risk for significant risk of neurological worsening from cerebral edema,  death from brain herniation, heart failure, hemorrhagic conversion, infection, respiratory failure and seizure. This patient's care requires constant monitoring of vital signs, hemodynamics, respiratory and cardiac monitoring, review of multiple databases, neurological assessment, discussion with family, other specialists and medical decision making of high complexity.  I spent 55  minutes of neurocritical time in the care of this patient.         Nikitia Asbill [facility] Pager Number  [phone]

## 2020-03-24 NOTE — Anesthesia Procedure Notes (Signed)
Arterial Line Insertion Start/End6/14/2021 12:09 AM, 03/24/2020 12:12 AM Performed by: Gaynelle Adu, MD  Patient location: OR. Preanesthetic checklist: patient identified, IV checked, site marked, risks and benefits discussed, surgical consent, monitors and equipment checked, pre-op evaluation, timeout performed and anesthesia consent Lidocaine 1% used for infiltration Left, radial was placed Catheter size: 20 Fr Hand hygiene performed , maximum sterile barriers used  and Seldinger technique used  Attempts: 1 Procedure performed without using ultrasound guided technique. Following insertion, dressing applied and Biopatch. Post procedure assessment: normal and unchanged

## 2020-03-24 NOTE — Sedation Documentation (Signed)
Pt taken to CT for [place] [facility] CT.

## 2020-03-24 NOTE — Sedation Documentation (Signed)
Pt taken to 4N20 for admission.

## 2020-03-24 NOTE — Progress Notes (Signed)
Referring Physician(s): Code Stroke- Aroor, Sushanth R  Supervising Physician: Luanne Bras  Patient Status:  [facility] - In-pt  Chief Complaint: None- intubated with sedation  Subjective:  History of acute CVA s/p cerebral arteriogram with emergent mechanical thrombectomy of right MCA M1 occlusion achieving a TICI 3 revascularization via right femoral approach 03/24/2020 by Dr. Estanislado Pandy. Patient laying in bed intubated with sedation. She opens eyes to voice and follows simple commands. Denies headache. Can spontaneously move right side, no spontaneous movements of left side. Right groin puncture site c/d/i.   Allergies: Semaglutide, Farxiga [dapagliflozin], and Penicillins  Medications: Prior to Admission medications   Medication Sig Start Date End Date Taking? Authorizing Provider  amLODipine (NORVASC) 5 MG tablet TAKE ONE (1) TABLET EACH DAY 01/09/20   Baruch Gouty, FNP  aspirin EC 81 MG tablet Take 81 mg by mouth daily.    [provider]  Blood Glucose Monitoring Suppl (ONETOUCH VERIO) w/Device KIT 1 each by Does not apply route daily. 03/10/18   Eustaquio Maize, MD  cholecalciferol (VITAMIN D) 1000 units tablet Take 5,000 Units by mouth daily.     [provider]  denosumab (PROLIA) 60 MG/ML SOSY injection Inject 60 mg into the skin every 6 (six) months. 11/15/18   Baruch Gouty, FNP  Dulaglutide (TRULICITY) 2.22 LN/9.8XQ SOPN Inject 0.75 mg into the skin.    [provider]  glimepiride (AMARYL) 1 MG tablet 1 by mouth q day 12/27/05   [provider]  glucose blood (ONETOUCH VERIO) test strip Use as instructed 03/10/18   Eustaquio Maize, MD  ibuprofen (ADVIL) 800 MG tablet 1 po bid with food 05/12/05   [provider]  Lancets Glory Rosebush ULTRASOFT) lancets Use as instructed 03/10/18   Eustaquio Maize, MD  levothyroxine (SYNTHROID) 125 MCG tablet Take 1 tablet (125 mcg total) by mouth daily. 11/19/19   Baruch Gouty, FNP  lisinopril  (ZESTRIL) 20 MG tablet Take 1 tablet (20 mg total) by mouth daily. 03/04/20   Hassell Done Mary-Margaret, FNP  metFORMIN (GLUCOPHAGE) 500 MG tablet Take 1 tablet (500 mg total) by mouth 2 (two) times daily with a meal. Patient taking differently: Take 500 mg by mouth daily with breakfast.  02/26/20 05/26/20  Dettinger, Fransisca Kaufmann, MD  metoprolol tartrate (LOPRESSOR) 100 MG tablet TAKE ONE TABLET BY MOUTH TWICE DAILY 01/09/20   Rakes, Connye Burkitt, FNP  naproxen sodium (ALEVE) 220 MG tablet Take 220 mg by mouth 2 (two) times daily as needed (for pain).    [provider]  pravastatin (PRAVACHOL) 40 MG tablet Take 1 tablet (40 mg total) by mouth at bedtime. 08/15/19   Baruch Gouty, FNP  Semaglutide,0.25 or 0.5MG/DOS, (OZEMPIC, 0.25 OR 0.5 MG/DOSE,) 2 MG/1.5ML SOPN Inject into the skin.    [provider]     Vital Signs: BP 112/70   Pulse 97   Temp 99.9 F (37.7 C) (Axillary)   Resp 16   Wt 169 lb 12.1 oz (77 kg)   SpO2 100%   BMI 26.59 kg/m   Physical Exam Vitals and nursing note reviewed.  Constitutional:      General: She is not in acute distress.    Comments: Intubated with sedation.  Pulmonary:     Effort: Pulmonary effort is normal. No respiratory distress.     Comments: Intubated with sedation. Skin:    General: Skin is warm and dry.     Comments: Right groin puncture site soft without active  bleeding or hematoma.  Neurological:     Comments: Intubated with sedation. She opens eyes to voice and follows simple commands. PERRL bilaterally. Can spontaneously move right side, no spontaneous movements of left side. Distal pulses (DPs) 1+ bilaterally.     Imaging: CT ANGIO HEAD W OR WO CONTRAST  Result Date: 03/23/2020 CLINICAL DATA:  Left arm weakness and facial droop EXAM: CT ANGIOGRAPHY HEAD AND NECK TECHNIQUE: Multidetector CT imaging of the head and neck was performed using the standard protocol during bolus administration of intravenous contrast. Multiplanar CT  image reconstructions and MIPs were obtained to evaluate the vascular anatomy. Carotid stenosis measurements (when applicable) are obtained utilizing NASCET criteria, using the distal internal carotid diameter as the denominator. CONTRAST:  60m OMNIPAQUE IOHEXOL 350 MG/ML SOLN COMPARISON:  None. FINDINGS: CTA NECK FINDINGS SKELETON: There is no bony spinal canal stenosis. No lytic or blastic lesion. OTHER NECK: Normal pharynx, larynx and major salivary glands. No cervical lymphadenopathy. Unremarkable thyroid gland. UPPER CHEST: No pneumothorax or pleural effusion. No nodules or masses. AORTIC ARCH: There is no calcific atherosclerosis of the aortic arch. There is no aneurysm, dissection or hemodynamically significant stenosis of the visualized portion of the aorta. Conventional 3 vessel aortic branching pattern. The visualized proximal subclavian arteries are widely patent. RIGHT CAROTID SYSTEM: Normal without aneurysm, dissection or stenosis. LEFT CAROTID SYSTEM: Normal without aneurysm, dissection or stenosis. VERTEBRAL ARTERIES: Left dominant configuration. Both origins are clearly patent. There is no dissection, occlusion or flow-limiting stenosis to the skull base (V1-V3 segments). CTA HEAD FINDINGS POSTERIOR CIRCULATION: --Vertebral arteries: Normal V4 segments. --Inferior cerebellar arteries: Normal. --Basilar artery: Normal. --Superior cerebellar arteries: Normal. --Posterior cerebral arteries (PCA): Normal. ANTERIOR CIRCULATION: --Intracranial internal carotid arteries: Normal. --Anterior cerebral arteries (ACA): Normal. Both A1 segments are present. Patent anterior communicating artery (a-comm). --Middle cerebral arteries (MCA): There is a distal right MCA M1 segment occlusion. Left MCA is normal. VENOUS SINUSES: As permitted by contrast timing, patent. ANATOMIC VARIANTS: Fetal origin of the right posterior cerebral artery. There is moderate collateral flow in the right MCA territory. Review of the MIP  images confirms the above findings. IMPRESSION: 1. Distal right MCA M1 segment occlusion with moderate collateral flow in the right MCA territory. 2. No other intracranial arterial occlusion or high-grade stenosis. 3. Normal cervical carotid and vertebral arteries. Critical Value/emergent results were called by telephone at the time of interpretation on 03/23/2020 at 11:13 pm to provider SSamara Snide, who verbally acknowledged these results. Electronically Signed   By: KUlyses JarredM.D.   On: 03/23/2020 23:17   CT HEAD WO CONTRAST  Result Date: 03/24/2020 CLINICAL DATA:  Status post thrombectomy EXAM: CT HEAD WITHOUT CONTRAST TECHNIQUE: Contiguous axial images were obtained from the base of the skull through the vertex without intravenous contrast. COMPARISON:  None. FINDINGS: Brain: There is no mass, hemorrhage or extra-axial collection. The size and configuration of the ventricles and extra-axial CSF spaces are normal. There is hypoattenuation of the white matter, most commonly indicating chronic small vessel disease. Vascular: Expected intravascular enhancement. Skull: The visualized skull base, calvarium and extracranial soft tissues are normal. Sinuses/Orbits: No fluid levels or advanced mucosal thickening of the visualized paranasal sinuses. No mastoid or middle ear effusion. The orbits are normal. IMPRESSION: 1. No acute intracranial abnormality. 2. Chronic small vessel disease. Electronically Signed   By: KUlyses JarredM.D.   On: 03/24/2020 02:34   CT ANGIO NECK W OR WO CONTRAST  Result Date: 03/23/2020 CLINICAL DATA:  Left arm  weakness and facial droop EXAM: CT ANGIOGRAPHY HEAD AND NECK TECHNIQUE: Multidetector CT imaging of the head and neck was performed using the standard protocol during bolus administration of intravenous contrast. Multiplanar CT image reconstructions and MIPs were obtained to evaluate the vascular anatomy. Carotid stenosis measurements (when applicable) are obtained  utilizing NASCET criteria, using the distal internal carotid diameter as the denominator. CONTRAST:  56m OMNIPAQUE IOHEXOL 350 MG/ML SOLN COMPARISON:  None. FINDINGS: CTA NECK FINDINGS SKELETON: There is no bony spinal canal stenosis. No lytic or blastic lesion. OTHER NECK: Normal pharynx, larynx and major salivary glands. No cervical lymphadenopathy. Unremarkable thyroid gland. UPPER CHEST: No pneumothorax or pleural effusion. No nodules or masses. AORTIC ARCH: There is no calcific atherosclerosis of the aortic arch. There is no aneurysm, dissection or hemodynamically significant stenosis of the visualized portion of the aorta. Conventional 3 vessel aortic branching pattern. The visualized proximal subclavian arteries are widely patent. RIGHT CAROTID SYSTEM: Normal without aneurysm, dissection or stenosis. LEFT CAROTID SYSTEM: Normal without aneurysm, dissection or stenosis. VERTEBRAL ARTERIES: Left dominant configuration. Both origins are clearly patent. There is no dissection, occlusion or flow-limiting stenosis to the skull base (V1-V3 segments). CTA HEAD FINDINGS POSTERIOR CIRCULATION: --Vertebral arteries: Normal V4 segments. --Inferior cerebellar arteries: Normal. --Basilar artery: Normal. --Superior cerebellar arteries: Normal. --Posterior cerebral arteries (PCA): Normal. ANTERIOR CIRCULATION: --Intracranial internal carotid arteries: Normal. --Anterior cerebral arteries (ACA): Normal. Both A1 segments are present. Patent anterior communicating artery (a-comm). --Middle cerebral arteries (MCA): There is a distal right MCA M1 segment occlusion. Left MCA is normal. VENOUS SINUSES: As permitted by contrast timing, patent. ANATOMIC VARIANTS: Fetal origin of the right posterior cerebral artery. There is moderate collateral flow in the right MCA territory. Review of the MIP images confirms the above findings. IMPRESSION: 1. Distal right MCA M1 segment occlusion with moderate collateral flow in the right MCA  territory. 2. No other intracranial arterial occlusion or high-grade stenosis. 3. Normal cervical carotid and vertebral arteries. Critical Value/emergent results were called by telephone at the time of interpretation on 03/23/2020 at 11:13 pm to provider SSamara Snide, who verbally acknowledged these results. Electronically Signed   By: KUlyses JarredM.D.   On: 03/23/2020 23:17   DG CHEST PORT 1 VIEW  Result Date: 03/24/2020 CLINICAL DATA:  Endotracheal tube placement. EXAM: PORTABLE CHEST 1 VIEW COMPARISON:  August 19, 2018 FINDINGS: The endotracheal tube terminates above the carina by approximately 3 cm. The heart size is unchanged from prior study. The lung volumes are low. Aortic calcifications are noted. There is no pneumothorax. Bibasilar atelectasis is noted. IMPRESSION: 1. Endotracheal tube as above. 2. Low lung volumes with bibasilar atelectasis. Electronically Signed   By: CConstance HolsterM.D.   On: 03/24/2020 03:54   CT HEAD CODE STROKE WO CONTRAST  Result Date: 03/23/2020 CLINICAL DATA:  Code stroke.  Left arm weakness and facial droop. EXAM: CT HEAD WITHOUT CONTRAST TECHNIQUE: Contiguous axial images were obtained from the base of the skull through the vertex without intravenous contrast. COMPARISON:  None. FINDINGS: Brain: There is no mass, hemorrhage or extra-axial collection. The size and configuration of the ventricles and extra-axial CSF spaces are normal. There is hypoattenuation of the periventricular white matter, most commonly indicating chronic ischemic microangiopathy. Vascular: No abnormal hyperdensity of the major intracranial arteries or dural venous sinuses. No intracranial atherosclerosis. Skull: The visualized skull base, calvarium and extracranial soft tissues are normal. Sinuses/Orbits: No fluid levels or advanced mucosal thickening of the visualized paranasal sinuses. No mastoid or  middle ear effusion. The orbits are normal. ASPECTS [facility] Stroke Program Early CT  Score) - Ganglionic level infarction (caudate, lentiform nuclei, internal capsule, insula, M1-M3 cortex): 7 - Supraganglionic infarction (M4-M6 cortex): 3 Total score (0-10 with 10 being normal): 10 IMPRESSION: 1. No acute intracranial abnormality 2. ASPECTS is 10. These results were communicated to Dr. Karena Addison Aroor at 10:41 pm on 03/23/2020 by text page via the [facility] messaging system. Electronically Signed   By: Ulyses Jarred M.D.   On: 03/23/2020 22:41    Labs:  CBC: Recent Labs    08/15/19 0834 08/15/19 0834 11/15/19 0859 03/23/20 2227 03/23/20 2229 03/24/20 0337 03/24/20 0450  WBC 9.5  --  9.0 9.4  --   --  12.2*  HGB 13.7   < > 13.6 13.4 12.9 11.2* 12.1  HCT 41.2   < > 39.1 40.8 38.0 33.0* 34.8*  PLT 246  --  233 222  --   --  224   < > = values in this interval not displayed.    COAGS: Recent Labs    03/23/20 2227  INR 1.0  APTT 27    BMP: Recent Labs    02/21/20 1009 02/21/20 1009 02/26/20 1019 02/26/20 1019 03/23/20 2227 03/23/20 2229 03/24/20 0337 03/24/20 0450  NA 140   < > 137  --  138 142 139 136  K 4.5   < > 4.3   < > 4.2 4.2 3.8 4.0  CL 104   < > 104  --  104 105  --  106  CO2 20  --  20  --  21*  --   --  19*  GLUCOSE 346*   < > 305*  --  316* 302*  --  308*  BUN 39*   < > 43*  --  41* 39*  --  39*  CALCIUM 9.8  --  8.0*  --  9.3  --   --  8.3*  CREATININE 1.51*   < > 1.72*  --  1.70* 1.60*  --  1.54*  GFRNONAA 34*  --  29*  --  29*  --   --  33*  GFRAA 39*  --  34*  --  34*  --   --  38*   < > = values in this interval not displayed.    LIVER FUNCTION TESTS: Recent Labs    11/15/19 0859 02/21/20 1009 02/26/20 1019 03/23/20 2227  BILITOT 0.3 <0.2 0.3 0.9  AST 13 16 17 15   ALT 12 11 16 15   ALKPHOS 71 64 72 54  PROT 6.4 6.6 6.3 6.9  ALBUMIN 3.9 4.1 3.8 3.4*    Assessment and Plan:  History of acute CVA s/p cerebral arteriogram with emergent mechanical thrombectomy of right MCA M1 occlusion achieving a TICI 3 revascularization via  right femoral approach 03/24/2020 by Dr. Estanislado Pandy. Patient's condition stable- remains intubated/sedated but opens eyes to voice and follows simple commands, can spontaneously move right side but no spontaneous movements of left side. Right groin puncture site stable, distal pulses (DPs) 1+ bilaterally. Further plans per neurology/CCM- appreciate and agree with management. NIR to follow.   Electronically Signed: Earley Abide, PA-C 03/24/2020, 9:51 AM   I spent a total of 25 Minutes at the the patient's bedside AND on the patient's hospital floor or unit, greater than 50% of which was counseling/coordinating care for CVA s/p revascularization.

## 2020-03-24 NOTE — Progress Notes (Signed)
eLink Physician-Brief Progress Note Patient [name] DOB: 27-May-1946 MRN: [id number]   Date of Service  03/24/2020  HPI/Events of Note  RN requests propofol order for this ventilated patient. Confirmed that she does have fentanyl order - she does.   eICU Interventions  Ordered propofol drip.     Intervention Category Minor Interventions: Agitation / anxiety - evaluation and management  Janae Bridgeman 03/24/2020, 3:47 AM

## 2020-03-24 NOTE — Progress Notes (Signed)
Pt was extubated earlier today, however, after extubation, pt had large coffee ground emesis, zofran administrated. However, pt was lethargic and groggy, had CT repeat showed no bleeding but large right MCA developing infarct. CXR no obvious aspiration. KUB showed moderate right and mild left hydronephrosis. Pt also developed fever in pm with Tmax 102.3. blood culture and urine culture sent. UA showed WBC > 50, concerning for UTI and urosepsis. CCM Dr. Marchelle Gearing started cefepime. Pt had further worsening mental status, likely due to fever and sepsis, but will do MRI and MRA ASAP for evaluation. Discussed with Dr. Marchelle Gearing that pt has high risk to be re-intubated. Husband updated by CCM. Will need close monitoring.   Marvel Plan, MD PhD Stroke Neurology 03/24/2020 7:52 PM

## 2020-03-24 NOTE — Progress Notes (Addendum)
1130: Patient vomited large amount.  Neurology and critical care MD notified.  Patient is lethargic but no neuro change noted by RN.  STAT head CT ordered by neurologist.  MRI to be done in PM.  Critical care would like NG tube for decompression.  NG tube insertion unsuccessful by bedside.  Critical care medicine notified.  No new orders at this time.  RN to continue to monitor.

## 2020-03-24 NOTE — Transfer of Care (Signed)
Immediate Anesthesia Transfer of Care Note  Patient: Meredith Shaw  Procedure(s) Performed: IR WITH ANESTHESIA (N/A )  Patient Location: ICU  Anesthesia Type:General  Level of Consciousness: Patient remains intubated per anesthesia plan. Patient not following commands, bleeding s/p TPA in airway.    Airway & Oxygen Therapy: Patient remains intubated per anesthesia plan and Patient placed on Ventilator (see vital sign flow sheet for setting)  Post-op Assessment: Report given to RN and Post -op Vital signs reviewed and stable  Post vital signs: Reviewed and stable  Last Vitals:  Vitals Value Taken Time  BP    Temp    Pulse 87 03/24/20 0241  Resp 15 03/24/20 0241  SpO2 100 % 03/24/20 0241  Vitals shown include unvalidated device data.  Last Pain:  Vitals:   03/23/20 2213  TempSrc: Oral         Complications: No complications documented.

## 2020-03-24 NOTE — Progress Notes (Addendum)
Brief Progress Note  Emesis Pt had an episode of large-volume dark emesis this AM. Currently she is hemodynamically stable and resting comfortably in bed. Abdomen is soft with no peritoneal signs. Abdomen initially non-tender on exam. On repeat exam, patient is endorsing diffuse tenderness to palpation.   I spoke with husband to discuss history of emesis or GIB. He reports that she has had rare occasions of vomiting, at most every 4 months, and never had dark-colored or bloody emesis. Denies blood in stool or urine. He reports that she is following with a nephrologist, possibly for diabetic nephropathy.   Concern for acute surgical intraabdominal process is low, though exam is limited by altered mental status. Given that pt received TPA, there is concern for possible GI bleed. Also considering other causes of emesis/abdominal pain such as pancreatitis, ischemia, obstruction. Per RN, NG tube insertion for decompression was not successful. Will check labs and basic emesis workup, including CBC, lipase, amylase, LFTs and check abdominal X-ray.  Hold on NG tube for now. If vomiting persists, may need VIR consult for placement.  Markus Jarvis, MS4

## 2020-03-24 NOTE — ED Notes (Signed)
Arrived in [place]  In [place]

## 2020-03-24 NOTE — Anesthesia Postprocedure Evaluation (Signed)
Anesthesia Post Note  Patient: Meredith Shaw  Procedure(s) Performed: IR WITH ANESTHESIA (N/A )     Patient location during evaluation: SICU Anesthesia Type: General Level of consciousness: sedated Pain management: pain level controlled Vital Signs Assessment: post-procedure vital signs reviewed and stable Respiratory status: patient remains intubated per anesthesia plan Cardiovascular status: stable Postop Assessment: no apparent nausea or vomiting Anesthetic complications: no   No complications documented.  Last Vitals:  Vitals:   03/24/20 0645 03/24/20 0700  BP: (!) 121/91 114/66  Pulse: 87 88  Resp: 15 13  Temp:    SpO2: 100% 100%    Last Pain:  Vitals:   03/24/20 0400  TempSrc: Axillary                 Griffin Dewilde,W. EDMOND

## 2020-03-24 NOTE — ED Notes (Signed)
2 rings removed, glasses, upper/lower dentures, shoes, socks, and clothing given to the patients son. Family brought to IR to speak with doctor

## 2020-03-24 NOTE — Progress Notes (Signed)
Patient taken to MRI per MD request from day shift. Pt less responsive than during the day and not following commands consistently.   Attempted MRI and pt needed a higher 02 requirement from 2L to 4L and satting in upper 80's when flat. She also sounded very gurgly and had snoring respirations.   In the MRI her heart rate dropped to low 40's and I aborted the MRI. Paged neuro and CCM with findings and ground team to look at her.   Will continue to monitor.  Sherrie George, RN BSN, ME

## 2020-03-24 NOTE — Progress Notes (Addendum)
Re round - coming off cleviprex  = Vomited earlier. CT head without change but large MCA Right side - No Midline shioft  - Labs show - AXR without ileus but has hydronephrosis - AT bedside - now febrile 102.93F. WBC 18k and rising. Lactate 4, AKI worse, Bic down - cXR without consolidation  _ bdside exam - > no distress but ongoing encephalopathy  - echo ? Hypertrophic   Assessment ACute enceph - waxing and waning due to stroke - high risk for midline shift + now sepsis Metabolic abnormalitis - AKI worse, lactic acidosis, concern for hydronephrosis SEpsis - UTI  V aspiration (MRSA PCR - negative)  Plan   - 1L fluid bolus and then d5LR  - check abg - chck PCT  - check trop - check UA and culture - check blood culture - get renal  US  - insert foley - start cefepime - monitor closely for intubation risk   CCM time additionmal 40 min D/w Dr Roda Shutters of stroke service      SIGNATURE    Dr. Kalman Shan, M.D., F.C.C.P,  Pulmonary and Critical Care Medicine Staff Physician, [facility] Director - Interstitial Lung Disease  Program  Pulmonary Fibrosis [facility] [place], [place], [address]  Pager: [phone], If no answer or between  15:00h - 7:00h: call [phone] Telephone: [phone]  6:13 PM 03/24/2020   Antibiotics Given (last 72 hours)    None      Allergies  Allergen Reactions  . Semaglutide Nausea And Vomiting    PO Rybelsus  . Farxiga [Dapagliflozin]     Yeast infections  . Penicillins Itching and Rash     LABS    PULMONARY Recent Labs  Lab 03/23/20 2229 03/24/20 0337  PHART  --  7.346*  PCO2ART  --  38.7  PO2ART  --  157*  HCO3  --  21.2  TCO2 21* 22  O2SAT  --  99.0    CBC Recent Labs  Lab 03/23/20 2227 03/23/20 2229 03/24/20 0337 03/24/20 0450 03/24/20 1527  HGB 13.4   < > 11.2* 12.1 12.0  HCT 40.8   < > 33.0* 34.8* 35.3*  WBC 9.4  --   --  12.2* 18.6*  PLT  222  --   --  224 216   < > = values in this interval not displayed.    COAGULATION Recent Labs  Lab 03/23/20 2227 03/24/20 1527  INR 1.0 1.1    CARDIAC  No results for input(s): TROPONINI in the last 168 hours. No results for input(s): PROBNP in the last 168 hours.   CHEMISTRY Recent Labs  Lab 03/23/20 2227 03/23/20 2227 03/23/20 2229 03/23/20 2229 03/24/20 0337 03/24/20 0337 03/24/20 0450 03/24/20 1527  NA 138  --  142  --  139  --  136 138  K 4.2   < > 4.2   < > 3.8   < > 4.0 4.0  CL 104  --  105  --   --   --  106 108  CO2 21*  --   --   --   --   --  19* 17*  GLUCOSE 316*  --  302*  --   --   --  308* 298*  BUN 41*  --  39*  --   --   --  39* 41*  CREATININE 1.70*  --  1.60*  --   --   --  1.54* 1.79*  CALCIUM 9.3  --   --   --   --   --  8.3* 8.6*   < > = values in this interval not displayed.   Estimated Creatinine Clearance: 30 mL/min (A) (by C-G formula based on SCr of 1.79 mg/dL (H)).   LIVER Recent Labs  Lab 03/23/20 2227 03/24/20 1527  AST 15 21  ALT 15 15  ALKPHOS 54 52  BILITOT 0.9 0.4  PROT 6.9 6.4*  ALBUMIN 3.4* 3.0*  INR 1.0 1.1     INFECTIOUS Recent Labs  Lab 03/24/20 1527  LATICACIDVEN 4.1*     ENDOCRINE CBG (last 3)  Recent Labs    03/24/20 0807 03/24/20 1146 03/24/20 1555  GLUCAP 364* 336* 235*         IMAGING x48h  - image(s) personally visualized  -   highlighted in bold CT ANGIO HEAD W OR WO CONTRAST  Result Date: 03/23/2020 CLINICAL DATA:  Left arm weakness and facial droop EXAM: CT ANGIOGRAPHY HEAD AND NECK TECHNIQUE: Multidetector CT imaging of the head and neck was performed using the standard protocol during bolus administration of intravenous contrast. Multiplanar CT image reconstructions and MIPs were obtained to evaluate the vascular anatomy. Carotid stenosis measurements (when applicable) are obtained utilizing NASCET criteria, using the distal internal carotid diameter as the denominator. CONTRAST:   33mL OMNIPAQUE IOHEXOL 350 MG/ML SOLN COMPARISON:  None. FINDINGS: CTA NECK FINDINGS SKELETON: There is no bony spinal canal stenosis. No lytic or blastic lesion. OTHER NECK: Normal pharynx, larynx and major salivary glands. No cervical lymphadenopathy. Unremarkable thyroid gland. UPPER CHEST: No pneumothorax or pleural effusion. No nodules or masses. AORTIC ARCH: There is no calcific atherosclerosis of the aortic arch. There is no aneurysm, dissection or hemodynamically significant stenosis of the visualized portion of the aorta. Conventional 3 vessel aortic branching pattern. The visualized proximal subclavian arteries are widely patent. RIGHT CAROTID SYSTEM: Normal without aneurysm, dissection or stenosis. LEFT CAROTID SYSTEM: Normal without aneurysm, dissection or stenosis. VERTEBRAL ARTERIES: Left dominant configuration. Both origins are clearly patent. There is no dissection, occlusion or flow-limiting stenosis to the skull base (V1-V3 segments). CTA HEAD FINDINGS POSTERIOR CIRCULATION: --Vertebral arteries: Normal V4 segments. --Inferior cerebellar arteries: Normal. --Basilar artery: Normal. --Superior cerebellar arteries: Normal. --Posterior cerebral arteries (PCA): Normal. ANTERIOR CIRCULATION: --Intracranial internal carotid arteries: Normal. --Anterior cerebral arteries (ACA): Normal. Both A1 segments are present. Patent anterior communicating artery (a-comm). --Middle cerebral arteries (MCA): There is a distal right MCA M1 segment occlusion. Left MCA is normal. VENOUS SINUSES: As permitted by contrast timing, patent. ANATOMIC VARIANTS: Fetal origin of the right posterior cerebral artery. There is moderate collateral flow in the right MCA territory. Review of the MIP images confirms the above findings. IMPRESSION: 1. Distal right MCA M1 segment occlusion with moderate collateral flow in the right MCA territory. 2. No other intracranial arterial occlusion or high-grade stenosis. 3. Normal cervical carotid  and vertebral arteries. Critical Value/emergent results were called by telephone at the time of interpretation on 03/23/2020 at 11:13 pm to provider Arther Dames , who verbally acknowledged these results. Electronically Signed   By: Deatra Robinson M.D.   On: 03/23/2020 23:17   DG Abd 1 View  Result Date: 03/24/2020 CLINICAL DATA:  Vomiting. EXAM: ABDOMEN - 1 VIEW COMPARISON:  Abdominal x-ray dated September 25, 2015. FINDINGS: The bowel gas pattern is normal. No radio-opaque calculi or other significant radiographic abnormality are seen. Excreted contrast in the renal collecting systems, ureters, and bladder. Moderate  right and mild left hydroureteronephrosis. No acute osseous abnormality. IMPRESSION: 1. No obstruction. 2. Moderate right and mild left hydroureteronephrosis. Electronically Signed   By: Obie Dredge M.D.   On: 03/24/2020 16:42   CT HEAD WO CONTRAST  Result Date: 03/24/2020 CLINICAL DATA:  Stroke. Left arm weakness and facial droop. Right M1 occlusion yesterday with subsequent vascular intervention. EXAM: CT HEAD WITHOUT CONTRAST TECHNIQUE: Contiguous axial images were obtained from the base of the skull through the vertex without intravenous contrast. COMPARISON:  Head CT earlier same day FINDINGS: Brain: Developing low-density with loss of gray-white differentiation throughout the right middle cerebral artery territory affecting the insula, frontal operculum and right frontoparietal cortical and subcortical brain. Sparing of the basal ganglia. No evidence of hemorrhage or midline shift. Chronic small-vessel ischemic changes elsewhere affect the cerebral hemispheric white matter. Old right cerebellar infarction. No hydrocephalus. No extra-axial collection. Vascular: There is atherosclerotic calcification of the major vessels at the base of the brain. Skull: Negative Sinuses/Orbits: Clear/normal Other: None IMPRESSION: Changes of acute infarction now evident by CT affecting the right middle  cerebral artery territory with sparing of the basal ganglia. No hemorrhage. No midline shift. Electronically Signed   By: Paulina Fusi M.D.   On: 03/24/2020 12:32   CT HEAD WO CONTRAST  Result Date: 03/24/2020 CLINICAL DATA:  Status post thrombectomy EXAM: CT HEAD WITHOUT CONTRAST TECHNIQUE: Contiguous axial images were obtained from the base of the skull through the vertex without intravenous contrast. COMPARISON:  None. FINDINGS: Brain: There is no mass, hemorrhage or extra-axial collection. The size and configuration of the ventricles and extra-axial CSF spaces are normal. There is hypoattenuation of the white matter, most commonly indicating chronic small vessel disease. Vascular: Expected intravascular enhancement. Skull: The visualized skull base, calvarium and extracranial soft tissues are normal. Sinuses/Orbits: No fluid levels or advanced mucosal thickening of the visualized paranasal sinuses. No mastoid or middle ear effusion. The orbits are normal. IMPRESSION: 1. No acute intracranial abnormality. 2. Chronic small vessel disease. Electronically Signed   By: Deatra Robinson M.D.   On: 03/24/2020 02:34   CT ANGIO NECK W OR WO CONTRAST  Result Date: 03/23/2020 CLINICAL DATA:  Left arm weakness and facial droop EXAM: CT ANGIOGRAPHY HEAD AND NECK TECHNIQUE: Multidetector CT imaging of the head and neck was performed using the standard protocol during bolus administration of intravenous contrast. Multiplanar CT image reconstructions and MIPs were obtained to evaluate the vascular anatomy. Carotid stenosis measurements (when applicable) are obtained utilizing NASCET criteria, using the distal internal carotid diameter as the denominator. CONTRAST:  80mL OMNIPAQUE IOHEXOL 350 MG/ML SOLN COMPARISON:  None. FINDINGS: CTA NECK FINDINGS SKELETON: There is no bony spinal canal stenosis. No lytic or blastic lesion. OTHER NECK: Normal pharynx, larynx and major salivary glands. No cervical lymphadenopathy.  Unremarkable thyroid gland. UPPER CHEST: No pneumothorax or pleural effusion. No nodules or masses. AORTIC ARCH: There is no calcific atherosclerosis of the aortic arch. There is no aneurysm, dissection or hemodynamically significant stenosis of the visualized portion of the aorta. Conventional 3 vessel aortic branching pattern. The visualized proximal subclavian arteries are widely patent. RIGHT CAROTID SYSTEM: Normal without aneurysm, dissection or stenosis. LEFT CAROTID SYSTEM: Normal without aneurysm, dissection or stenosis. VERTEBRAL ARTERIES: Left dominant configuration. Both origins are clearly patent. There is no dissection, occlusion or flow-limiting stenosis to the skull base (V1-V3 segments). CTA HEAD FINDINGS POSTERIOR CIRCULATION: --Vertebral arteries: Normal V4 segments. --Inferior cerebellar arteries: Normal. --Basilar artery: Normal. --Superior cerebellar arteries: Normal. --Posterior cerebral  arteries (PCA): Normal. ANTERIOR CIRCULATION: --Intracranial internal carotid arteries: Normal. --Anterior cerebral arteries (ACA): Normal. Both A1 segments are present. Patent anterior communicating artery (a-comm). --Middle cerebral arteries (MCA): There is a distal right MCA M1 segment occlusion. Left MCA is normal. VENOUS SINUSES: As permitted by contrast timing, patent. ANATOMIC VARIANTS: Fetal origin of the right posterior cerebral artery. There is moderate collateral flow in the right MCA territory. Review of the MIP images confirms the above findings. IMPRESSION: 1. Distal right MCA M1 segment occlusion with moderate collateral flow in the right MCA territory. 2. No other intracranial arterial occlusion or high-grade stenosis. 3. Normal cervical carotid and vertebral arteries. Critical Value/emergent results were called by telephone at the time of interpretation on 03/23/2020 at 11:13 pm to provider Arther Dames , who verbally acknowledged these results. Electronically Signed   By: Deatra Robinson M.D.    On: 03/23/2020 23:17   DG CHEST PORT 1 VIEW  Result Date: 03/24/2020 CLINICAL DATA:  Stroke.  Aspiration. EXAM: PORTABLE CHEST 1 VIEW COMPARISON:  Earlier same day FINDINGS: Endotracheal tube is been removed. Heart size remains normal. No pulmonary edema. The patient has not taken a deep inspiration. There may be mild atelectasis at the right lung base but there is no dense consolidation or lobar collapse. No effusion. Surgical clips again noted at the right thoracic inlet. IMPRESSION: Extubated. Mild atelectasis at the right lung base. No dense consolidation or lobar collapse. Electronically Signed   By: Paulina Fusi M.D.   On: 03/24/2020 13:07   DG CHEST PORT 1 VIEW  Result Date: 03/24/2020 CLINICAL DATA:  Endotracheal tube placement. EXAM: PORTABLE CHEST 1 VIEW COMPARISON:  August 19, 2018 FINDINGS: The endotracheal tube terminates above the carina by approximately 3 cm. The heart size is unchanged from prior study. The lung volumes are low. Aortic calcifications are noted. There is no pneumothorax. Bibasilar atelectasis is noted. IMPRESSION: 1. Endotracheal tube as above. 2. Low lung volumes with bibasilar atelectasis. Electronically Signed   By: Katherine Mantle M.D.   On: 03/24/2020 03:54   ECHOCARDIOGRAM COMPLETE  Result Date: 03/24/2020    ECHOCARDIOGRAM REPORT   Patient Name:   Meredith Shaw Date of Exam: 03/24/2020 Medical Rec #:  [id number]        Height:       67.0 in Accession #:    [id number]       Weight:       169.8 lb Date of Birth:  April 03, 1946        BSA:          1.886 m Patient Age:    73 years         BP:           115/66 mmHg Patient Gender: F                HR:           85 bpm. Exam Location:  Inpatient Procedure: 2D Echo, Cardiac Doppler, Color Doppler and Intracardiac            Opacification Agent Indications:    Stroke 434.91 / I163.9  History:        Patient has no prior history of Echocardiogram examinations.                 Risk Factors:Hypertension and Diabetes.  Hypothyrodism.  Sonographer:    Tiffany Dance Referring Phys: [id number] Dara Lords AROOR  Sonographer Comments: Echo performed with patient supine and on artificial  respirator. IMPRESSIONS  1. Left ventricular ejection fraction, by estimation, is 70 to 75%. The left ventricle has hyperdynamic function with intracavitary gradient up to 32 mmHg. The left ventricle has no regional wall motion abnormalities. There is mild left ventricular hypertrophy. Left ventricular diastolic parameters are indeterminate.  2. Right ventricular systolic function is normal. The right ventricular size is normal. Tricuspid regurgitation signal is inadequate for assessing PA pressure.  3. The aortic valve is tricuspid. Aortic valve regurgitation is not visualized. Mild aortic valve sclerosis is present, with no evidence of aortic valve stenosis.  4. Aortic dilatation noted. 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  5. The inferior vena cava is normal in size with greater than 50% respiratory variability, suggesting right atrial pressure of 3 mmHg.  6. Appears to be echodensity attached to left atrial side of posterior leaflet of mitral valve (best seen on image 3), but notably only seen on parasternal long axis images. Recomend further evaluation with with TEE to assess for cardioembolic source of  CVA. The mitral valve is degenerative. Trivial mitral valve regurgitation. FINDINGS  Left Ventricle: Hyperdynamic LV function with intracavitary gradient 32 mmHg. Left ventricular ejection fraction, by estimation, is 70 to 75%. The left ventricle has hyperdynamic function. The left ventricle has no regional wall motion abnormalities. Definity contrast agent was given IV to delineate the left ventricular endocardial borders. The left ventricular internal cavity size was normal in size. There is mild left ventricular hypertrophy. Left ventricular diastolic parameters are indeterminate. Right Ventricle: The right ventricular  size is normal. Right vetricular wall thickness was not assessed. Right ventricular systolic function is normal. Tricuspid regurgitation signal is inadequate for assessing PA pressure. Left Atrium: Left atrial size was normal in size. Right Atrium: Right atrial size was normal in size. Pericardium: There is no evidence of pericardial effusion. Mitral Valve: Possible echodensity attached to left atrial side of posterior leaflet of mitral valve (best seen on image 3), but notably only seen on parasternal long axis images. Recomend further evaluation with with TEE to rule out cardioembolic source  of CVA. The mitral valve is degenerative in appearance. Trivial mitral valve regurgitation. Tricuspid Valve: The tricuspid valve is normal in structure. Tricuspid valve regurgitation is not demonstrated. Aortic Valve: The aortic valve is tricuspid. Aortic valve regurgitation is not visualized. Mild aortic valve sclerosis is present, with no evidence of aortic valve stenosis. Pulmonic Valve: The pulmonic valve was not well visualized. Pulmonic valve regurgitation is not visualized. Aorta: Aortic dilatation noted. 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 Venous: The inferior vena cava is normal in size with greater than 50% respiratory variability, suggesting right atrial pressure of 3 mmHg. IAS/Shunts: The interatrial septum was not well visualized.  LEFT VENTRICLE PLAX 2D LVIDd:         4.00 cm LVIDs:         2.90 cm LV PW:         1.10 cm LV IVS:        1.00 cm LVOT diam:     1.80 cm LV SV:         86 LV SV Index:   46 LVOT Area:     2.54 cm  RIGHT VENTRICLE             IVC RV Basal diam:  2.10 cm     IVC diam: 1.60 cm RV S prime:     19.60 cm/s TAPSE (M-mode): 1.7 cm  LEFT ATRIUM             Index       RIGHT ATRIUM          Index LA diam:        3.40 cm 1.80 cm/m  RA Area:     7.76 cm LA Vol (A2C):   42.5 ml 22.53 ml/m RA Volume:   12.30 ml 6.52 ml/m LA Vol (A4C):   29.3 ml 15.53 ml/m LA  Biplane Vol: 37.4 ml 19.83 ml/m  AORTIC VALVE LVOT Vmax:   176.00 cm/s LVOT Vmean:  123.000 cm/s LVOT VTI:    0.338 m  AORTA Ao Root diam: 3.10 cm Ao Asc diam:  3.60 cm MITRAL VALVE MV Area (PHT): 2.60 cm     SHUNTS MV Decel Time: 292 msec     Systemic VTI:  0.34 m MV E velocity: 62.80 cm/s   Systemic Diam: 1.80 cm MV A velocity: 104.00 cm/s MV E/A ratio:  0.60 Oswaldo Milian MD Electronically signed by Oswaldo Milian MD Signature Date/Time: 03/24/2020/10:42:15 AM    Final    CT HEAD CODE STROKE WO CONTRAST  Result Date: 03/23/2020 CLINICAL DATA:  Code stroke.  Left arm weakness and facial droop. EXAM: CT HEAD WITHOUT CONTRAST TECHNIQUE: Contiguous axial images were obtained from the base of the skull through the vertex without intravenous contrast. COMPARISON:  None. FINDINGS: Brain: There is no mass, hemorrhage or extra-axial collection. The size and configuration of the ventricles and extra-axial CSF spaces are normal. There is hypoattenuation of the periventricular white matter, most commonly indicating chronic ischemic microangiopathy. Vascular: No abnormal hyperdensity of the major intracranial arteries or dural venous sinuses. No intracranial atherosclerosis. Skull: The visualized skull base, calvarium and extracranial soft tissues are normal. Sinuses/Orbits: No fluid levels or advanced mucosal thickening of the visualized paranasal sinuses. No mastoid or middle ear effusion. The orbits are normal. ASPECTS [facility] Stroke Program Early CT Score) - Ganglionic level infarction (caudate, lentiform nuclei, internal capsule, insula, M1-M3 cortex): 7 - Supraganglionic infarction (M4-M6 cortex): 3 Total score (0-10 with 10 being normal): 10 IMPRESSION: 1. No acute intracranial abnormality 2. ASPECTS is 10. These results were communicated to Dr. Karena Addison Aroor at 10:41 pm on 03/23/2020 by text page via the [facility] messaging system. Electronically Signed   By: Ulyses Jarred M.D.   On: 03/23/2020 22:41    VAS [place] LOWER EXTREMITY VENOUS (DVT)  Result Date: 03/24/2020  Lower Venous DVTStudy Indications: Stroke.  Limitations: Patient positioning. Comparison Study: 05/26/18 previous Performing Technologist: Abram Sander RVS  Examination Guidelines: A complete evaluation includes B-mode imaging, spectral Doppler, color Doppler, and power Doppler as needed of all accessible portions of each vessel. Bilateral testing is considered an integral part of a complete examination. Limited examinations for reoccurring indications may be performed as noted. The reflux portion of the exam is performed with the patient in reverse Trendelenburg.  +---------+---------------+---------+-----------+----------+--------------+ RIGHT    CompressibilityPhasicitySpontaneityPropertiesThrombus Aging +---------+---------------+---------+-----------+----------+--------------+ CFV      Full           Yes      Yes                                 +---------+---------------+---------+-----------+----------+--------------+ SFJ      Full                                                        +---------+---------------+---------+-----------+----------+--------------+  FV Prox  Full                                                        +---------+---------------+---------+-----------+----------+--------------+ FV Mid   Full                                                        +---------+---------------+---------+-----------+----------+--------------+ FV DistalFull                                                        +---------+---------------+---------+-----------+----------+--------------+ PFV      Full                                                        +---------+---------------+---------+-----------+----------+--------------+ POP      Full           Yes      Yes                                 +---------+---------------+---------+-----------+----------+--------------+ PTV      Full                                                         +---------+---------------+---------+-----------+----------+--------------+ PERO     Full                                                        +---------+---------------+---------+-----------+----------+--------------+   +---------+---------------+---------+-----------+----------+--------------+ LEFT     CompressibilityPhasicitySpontaneityPropertiesThrombus Aging +---------+---------------+---------+-----------+----------+--------------+ CFV      Full           Yes      Yes                                 +---------+---------------+---------+-----------+----------+--------------+ SFJ      Full                                                        +---------+---------------+---------+-----------+----------+--------------+ FV Prox  Full                                                        +---------+---------------+---------+-----------+----------+--------------+  FV Mid   Full                                                        +---------+---------------+---------+-----------+----------+--------------+ FV Distal               Yes      Yes                                 +---------+---------------+---------+-----------+----------+--------------+ PFV      Full                                                        +---------+---------------+---------+-----------+----------+--------------+ POP      Full           Yes      Yes                                 +---------+---------------+---------+-----------+----------+--------------+ PTV      Full                                                        +---------+---------------+---------+-----------+----------+--------------+ PERO                                                  Not visualized +---------+---------------+---------+-----------+----------+--------------+     Summary: BILATERAL: - No evidence of deep vein thrombosis seen  in the lower extremities, bilaterally. -   *See table(s) above for measurements and observations. Electronically signed by Gretta Began MD on 03/24/2020 at 5:33:26 PM.    Final

## 2020-03-24 NOTE — Progress Notes (Signed)
Called to bedside to check on patient's lethargy.  Possibly increasingly lethargic today.  Has been following very basic commands  per my chart review.   She became bradycardic in MRI today, aborted.   Now she is on 3L [place], no resp distress, protecting airway, but unclear if she is following command.  She does squeeze my hand when prompted but does not let go when prompted.  Moves eyelids with instruction but does not open.  R gaze preference.  Moves feet, possibly on commands, but has also been intermittently moving feet.   Appears to be newly septic, UTI, hydro seen on axr.   US renal pending to eval for obstructing hydronephrosis.  UOP good.    Started abtx earlier.   Discussed with Dr. Otelia Limes.  Will do CT head now.   Hold off on intubation for now.  If worsens or not protecting airway will intubate.

## 2020-03-24 NOTE — Anesthesia Procedure Notes (Addendum)
Procedure Name: Intubation Date/Time: 03/23/2020 11:51 PM Performed by: Edmonia Caprio, CRNA Pre-anesthesia Checklist: Patient identified, Emergency Drugs available, Patient being monitored, Suction available and Timeout performed Patient Re-evaluated:Patient Re-evaluated prior to induction Oxygen Delivery Method: Circle system utilized Preoxygenation: Pre-oxygenation with 100% oxygen Induction Type: IV induction, Rapid sequence and Cricoid Pressure applied Laryngoscope Size: Glidescope and 3 Grade View: Grade I Tube type: Oral Tube size: 7.5 mm Number of attempts: 1 Airway Equipment and Method: Video-laryngoscopy and Stylet Placement Confirmation: positive ETCO2,  ETT inserted through vocal cords under direct vision and breath sounds checked- equal and bilateral Secured at: 22 cm Tube secured with: Tape Dental Injury: Bloody posterior oropharynx  Comments: Blood in back of throat upon insertion of glidescope, prior to any airway manipulation by me.  Patient had just removed her dentures ? Cause of bleeding?

## 2020-03-24 NOTE — Progress Notes (Signed)
Unable to perform MRI due to bradycardia. Will obtain CT head.   Discussed with CCM, who is also evaluating her other acute medical comorbidities.   Electronically signed: Dr. Caryl Pina

## 2020-03-25 ENCOUNTER — Inpatient Hospital Stay (HOSPITAL_COMMUNITY): Payer: Medicare Other

## 2020-03-25 DIAGNOSIS — R000 Tachycardia, unspecified: Secondary | ICD-10-CM

## 2020-03-25 DIAGNOSIS — A419 Sepsis, unspecified organism: Secondary | ICD-10-CM

## 2020-03-25 DIAGNOSIS — N1330 Unspecified hydronephrosis: Secondary | ICD-10-CM

## 2020-03-25 DIAGNOSIS — N390 Urinary tract infection, site not specified: Secondary | ICD-10-CM

## 2020-03-25 DIAGNOSIS — I058 Other rheumatic mitral valve diseases: Secondary | ICD-10-CM

## 2020-03-25 DIAGNOSIS — G936 Cerebral edema: Secondary | ICD-10-CM

## 2020-03-25 DIAGNOSIS — D72829 Elevated white blood cell count, unspecified: Secondary | ICD-10-CM

## 2020-03-25 DIAGNOSIS — I6601 Occlusion and stenosis of right middle cerebral artery: Secondary | ICD-10-CM

## 2020-03-25 LAB — COMPREHENSIVE METABOLIC PANEL
ALT: 13 U/L (ref 0–44)
AST: 20 U/L (ref 15–41)
Albumin: 2.6 g/dL — ABNORMAL LOW (ref 3.5–5.0)
Alkaline Phosphatase: 45 U/L (ref 38–126)
Anion gap: 11 (ref 5–15)
BUN: 33 mg/dL — ABNORMAL HIGH (ref 8–23)
CO2: 19 mmol/L — ABNORMAL LOW (ref 22–32)
Calcium: 8.5 mg/dL — ABNORMAL LOW (ref 8.9–10.3)
Chloride: 109 mmol/L (ref 98–111)
Creatinine, Ser: 1.7 mg/dL — ABNORMAL HIGH (ref 0.44–1.00)
GFR calc Af Amer: 34 mL/min — ABNORMAL LOW (ref >60)
GFR calc non Af Amer: 29 mL/min — ABNORMAL LOW (ref >60)
Glucose, Bld: 248 mg/dL — ABNORMAL HIGH (ref 70–99)
Potassium: 3.9 mmol/L (ref 3.5–5.1)
Sodium: 139 mmol/L (ref 135–145)
Total Bilirubin: 0.6 mg/dL (ref 0.3–1.2)
Total Protein: 5.9 g/dL — ABNORMAL LOW (ref 6.5–8.1)

## 2020-03-25 LAB — PROCALCITONIN: Procalcitonin: 0.35 ng/mL

## 2020-03-25 LAB — GLUCOSE, CAPILLARY
Glucose-Capillary: 129 mg/dL — ABNORMAL HIGH (ref 70–99)
Glucose-Capillary: 135 mg/dL — ABNORMAL HIGH (ref 70–99)
Glucose-Capillary: 142 mg/dL — ABNORMAL HIGH (ref 70–99)
Glucose-Capillary: 171 mg/dL — ABNORMAL HIGH (ref 70–99)
Glucose-Capillary: 201 mg/dL — ABNORMAL HIGH (ref 70–99)
Glucose-Capillary: 217 mg/dL — ABNORMAL HIGH (ref 70–99)

## 2020-03-25 LAB — CBC
HCT: 32.8 % — ABNORMAL LOW (ref 36.0–46.0)
Hemoglobin: 11.1 g/dL — ABNORMAL LOW (ref 12.0–15.0)
MCH: 29.7 pg (ref 26.0–34.0)
MCHC: 33.8 g/dL (ref 30.0–36.0)
MCV: 87.7 fL (ref 80.0–100.0)
Platelets: 187 10*3/uL (ref 150–400)
RBC: 3.74 MIL/uL — ABNORMAL LOW (ref 3.87–5.11)
RDW: 13.8 % (ref 11.5–15.5)
WBC: 14.3 10*3/uL — ABNORMAL HIGH (ref 4.0–10.5)
nRBC: 0 % (ref 0.0–0.2)

## 2020-03-25 LAB — LACTIC ACID, PLASMA
Lactic Acid, Venous: 2.6 mmol/L (ref 0.5–1.9)
Lactic Acid, Venous: 1.8 mmol/L (ref 0.5–1.9)

## 2020-03-25 LAB — TROPONIN I (HIGH SENSITIVITY): Troponin I (High Sensitivity): 45 ng/L — ABNORMAL HIGH (ref <18)

## 2020-03-25 MED ORDER — ACETAMINOPHEN 10 MG/ML IV SOLN
1000.0000 mg | Freq: Four times a day (QID) | INTRAVENOUS | Status: AC
  Administered 2020-03-25 – 2020-03-26 (×4): 1000 mg via INTRAVENOUS
  Filled 2020-03-25 (×4): qty 100

## 2020-03-25 MED ORDER — SODIUM CHLORIDE 0.9 % IV SOLN: INTRAVENOUS | Status: DC

## 2020-03-25 MED ORDER — VITAL 1.5 CAL PO LIQD
1000.0000 mL | ORAL | Status: DC
  Administered 2020-03-26 – 2020-03-31 (×7): 1000 mL
  Filled 2020-03-25 (×9): qty 1000

## 2020-03-25 MED ORDER — ASPIRIN 300 MG RE SUPP
300.0000 mg | Freq: Every day | RECTAL | Status: DC
  Administered 2020-03-25 – 2020-03-26 (×2): 300 mg via RECTAL
  Filled 2020-03-25 (×2): qty 1

## 2020-03-25 MED ORDER — VITAL HIGH PROTEIN PO LIQD: 1000.0000 mL | ORAL | Status: DC

## 2020-03-25 MED ORDER — CLEVIDIPINE BUTYRATE 0.5 MG/ML IV EMUL
0.0000 mg/h | INTRAVENOUS | Status: DC
  Administered 2020-03-25 (×2): 6 mg/h via INTRAVENOUS
  Filled 2020-03-25: qty 50

## 2020-03-25 MED ORDER — SODIUM CHLORIDE 0.9 % IV SOLN
1.0000 g | Freq: Two times a day (BID) | INTRAVENOUS | Status: DC
  Administered 2020-03-25 – 2020-03-27 (×6): 1 g via INTRAVENOUS
  Filled 2020-03-25 (×8): qty 1

## 2020-03-25 MED ORDER — ORAL CARE MOUTH RINSE
15.0000 mL | Freq: Two times a day (BID) | OROMUCOSAL | Status: DC
  Administered 2020-03-25 – 2020-04-11 (×35): 15 mL via OROMUCOSAL

## 2020-03-25 MED ORDER — METOPROLOL TARTRATE 5 MG/5ML IV SOLN
2.5000 mg | INTRAVENOUS | Status: DC | PRN
  Administered 2020-03-25 (×2): 2.5 mg via INTRAVENOUS
  Administered 2020-03-27: 5 mg via INTRAVENOUS
  Administered 2020-03-28: 2.5 mg via INTRAVENOUS
  Filled 2020-03-25 (×4): qty 5

## 2020-03-25 MED ORDER — PRO-STAT SUGAR FREE PO LIQD
30.0000 mL | Freq: Two times a day (BID) | ORAL | Status: DC
  Administered 2020-03-26 – 2020-04-01 (×12): 30 mL
  Filled 2020-03-25 (×12): qty 30

## 2020-03-25 NOTE — Progress Notes (Signed)
eLink Physician-Brief Progress Note Patient [name] DOB: [birth year] MRN: [id number]   Date of Service  03/25/2020  HPI/Events of Note  Episodes of tachycardia into 120's.   eICU Interventions  Plan: 1. Metoprolol 2.5 mg IV Q 3 hours PRN HR > 115.      Intervention Category Major Interventions: Arrhythmia - evaluation and management  Victory Strollo [place] 03/25/2020, 3:38 AM

## 2020-03-25 NOTE — Progress Notes (Signed)
Inpatient Diabetes Program Recommendations  AACE/ADA: New Consensus Statement on Inpatient Glycemic Control (2015)  Target Ranges:  Prepandial:   less than 140 mg/dL      Peak postprandial:   less than 180 mg/dL (1-2 hours)      Critically ill patients:  140 - 180 mg/dL   Lab Results  Component Value Date   GLUCAP 201 (H) 03/25/2020   HGBA1C 8.9 (H) 03/24/2020    Review of Glycemic Control Results for Meredith Shaw, Meredith Shaw (MRN [id number]) as of 03/25/2020 07:53  Ref. Range 03/24/2020 04:43 03/24/2020 08:07 03/24/2020 11:46  Glucose-Capillary Latest Ref Range: 70 - 99 mg/dL 625 (H) 638 (H) 937 (H)   Results for Meredith Shaw, Meredith Shaw (MRN [id number]) as of 03/25/2020 07:53  Ref. Range 03/24/2020 15:55 03/24/2020 19:15 03/24/2020 23:08 03/25/2020 03:17 03/25/2020 07:49  Glucose-Capillary Latest Ref Range: 70 - 99 mg/dL 572 (H) 620 (H) 355 (H) 217 (H) 201 (H)   Diabetes history:  DM2  Outpatient Diabetes medications:  Ozempic 0.25 or 0.5 mg daily  Metformin 500 mg qam Amaryl 1 mg daily Trulicity 0.75 mg weekly  Current orders for Inpatient glycemic control:  Novolog 0-20 every 4 hours  Inpatient Diabetes Program Recommendations:     Lantus 8 units daily (0.1 units/kg)  Will continue to follow while inpatient.  Thank you, Dulce Sellar, RN, BSN Diabetes Coordinator Inpatient Diabetes Program [phone] (team pager from 8a-5p)

## 2020-03-25 NOTE — Progress Notes (Signed)
Brief Progress Note  Patient remains stable. Sats are stable at 96-100 on 4L [place]. She is opening her eyes with spontaneous movement of her extremities. Gurgling sound noted, persistent since AM.  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 Will need to re-intubate if decompensates - EEG showed no definitive seizures but with epileptogenic activity likely secondary to infarct.   Possible MV vegetation - Cardiology c/s, appreciate recs - Will need to get TEE and possible ID consult, per cards  Elevated Lactate, Fever Lactate has been downtrending. Now at 1.8 from 2.6. Last temp was 100 this AM.  - Continue meropenem - Continue to trend temps. Tylenol IV q6  If unsuccessful NGT placement again today, will have Cortrak done tomorrow (6/16). Will initiate feeds per RD note at that time.

## 2020-03-25 NOTE — Progress Notes (Signed)
SLP Cancellation Note  Patient Details [name] MRN: [id number] DOB: 1946-06-18   Cancelled treatment:       Reason Eval/Treat Not Completed: Patient not medically ready. RN just completed NTS, sats still at 90%. She says she is not following commands well either and recommends holding evaluation for now. Will f/u for readiness.   Mahala Menghini., M.A. CCC-SLP Acute Rehabilitation Services Pager [phone] Office [phone]  03/25/2020, 8:17 AM

## 2020-03-25 NOTE — Progress Notes (Signed)
eLink Physician-Brief Progress Note Patient [name] DOB: 20-Nov-1945 MRN: [id number]   Date of Service  03/25/2020  HPI/Events of Note  Multiple issues: 1. Poor cough - request for NT suctioning PRN and 2. Request to renew order for Cleviprex.   eICU Interventions  Plan: 1. NT suction PRN.  1. Will renew order for Cleviprex IV infusion.      Intervention Category Major Interventions: Other:  Lenell Antu 03/25/2020, 4:31 AM

## 2020-03-25 NOTE — Progress Notes (Signed)
Referring Physician(s): Code Stroke- Aroor, Sushanth R  Supervising Physician: Luanne Bras  Patient Status:  [facility] - In-pt  Chief Complaint: None- obtunded  Subjective:  History of acute CVA s/p cerebral arteriogram with emergent mechanical thrombectomy of right MCA M1 occlusion achieving a TICI 3 revascularization via right femoral approach 03/24/2020 by Dr. Estanislado Pandy. Patient laying in bed, she has been extubated but is obtunded- does not respond to voice or painful stimuli. Right side moves spontaneously but no spontaneous movements of left side. Right groin puncture site c/d/i. Daughter at bedside.   Allergies: Semaglutide, Farxiga [dapagliflozin], and Penicillins  Medications: Prior to Admission medications   Medication Sig Start Date End Date Taking? Authorizing Provider  amLODipine (NORVASC) 5 MG tablet TAKE ONE (1) TABLET EACH DAY Patient taking differently: Take 5 mg by mouth daily.  01/09/20  Yes Rakes, Connye Burkitt, FNP  aspirin EC 81 MG tablet Take 81 mg by mouth daily.   Yes [provider]  cholecalciferol (VITAMIN D) 1000 units tablet Take 5,000 Units by mouth daily.    Yes [provider]  denosumab (PROLIA) 60 MG/ML SOSY injection Inject 60 mg into the skin every 6 (six) months. 11/15/18  Yes Rakes, Connye Burkitt, FNP  Dulaglutide (TRULICITY) 1.30 QM/5.7QI SOPN Inject 0.75 mg into the skin once a week.    Yes [provider]  glimepiride (AMARYL) 1 MG tablet Take 1 mg by mouth daily with breakfast.  12/27/05  Yes [provider]  ibuprofen (ADVIL) 200 MG tablet Take 400 mg by mouth 2 (two) times daily as needed for moderate pain.  05/12/05  Yes [provider]  levothyroxine (SYNTHROID) 125 MCG tablet Take 1 tablet (125 mcg total) by mouth daily. 11/19/19  Yes Rakes, Connye Burkitt, FNP  lisinopril (ZESTRIL) 20 MG tablet Take 1 tablet (20 mg total) by mouth daily. 03/04/20  Yes Hassell Done, Mary-Margaret, FNP  metFORMIN (GLUCOPHAGE) 500 MG  tablet Take 1 tablet (500 mg total) by mouth 2 (two) times daily with a meal. Patient taking differently: Take 500 mg by mouth in the morning and at bedtime.  02/26/20 05/26/20 Yes Dettinger, Fransisca Kaufmann, MD  metoprolol tartrate (LOPRESSOR) 100 MG tablet TAKE ONE TABLET BY MOUTH TWICE DAILY Patient taking differently: Take 100 mg by mouth 2 (two) times daily.  01/09/20  Yes Rakes, Connye Burkitt, FNP  naproxen sodium (ALEVE) 220 MG tablet Take 220 mg by mouth 2 (two) times daily as needed (for pain).   Yes [provider]  Omega-3 Fatty Acids (FISH OIL PO) Take 1 capsule by mouth daily.   Yes [provider]  pravastatin (PRAVACHOL) 40 MG tablet Take 1 tablet (40 mg total) by mouth at bedtime. 08/15/19  Yes Rakes, Connye Burkitt, FNP  Blood Glucose Monitoring Suppl (ONETOUCH VERIO) w/Device KIT 1 each by Does not apply route daily. 03/10/18   Eustaquio Maize, MD  glucose blood [facility] VERIO) test strip Use as instructed 03/10/18   Eustaquio Maize, MD  Lancets [employer] ULTRASOFT) lancets Use as instructed 03/10/18   Eustaquio Maize, MD     Vital Signs: BP 133/64   Pulse 99   Temp (!) 101.7 F (38.7 C) Comment: tylenol given  Resp 18   Wt 169 lb 12.1 oz (77 kg)   SpO2 90%   BMI 26.59 kg/m   Physical Exam Vitals and nursing note reviewed.  Constitutional:      General: She is not in acute distress.    Comments: Obtunded.  Pulmonary:     Effort: Pulmonary effort is normal. No respiratory distress.  Skin:    General: Skin is warm and dry.     Comments: Right groin puncture site soft without active bleeding or hematoma.  Neurological:     Comments: Obtunded- she does not respond to voice or painful stimuli and does not follow simple commands. PERRL bilaterally. Right side moves spontaneously but no spontaneous movements of left side. Distal pulses (DPs) 1+ bilaterally.     Imaging: CT ANGIO HEAD W OR WO CONTRAST  Result Date: 03/23/2020 CLINICAL DATA:  Left arm weakness and  facial droop EXAM: CT ANGIOGRAPHY HEAD AND NECK TECHNIQUE: Multidetector CT imaging of the head and neck was performed using the standard protocol during bolus administration of intravenous contrast. Multiplanar CT image reconstructions and MIPs were obtained to evaluate the vascular anatomy. Carotid stenosis measurements (when applicable) are obtained utilizing NASCET criteria, using the distal internal carotid diameter as the denominator. CONTRAST:  32m OMNIPAQUE IOHEXOL 350 MG/ML SOLN COMPARISON:  None. FINDINGS: CTA NECK FINDINGS SKELETON: There is no bony spinal canal stenosis. No lytic or blastic lesion. OTHER NECK: Normal pharynx, larynx and major salivary glands. No cervical lymphadenopathy. Unremarkable thyroid gland. UPPER CHEST: No pneumothorax or pleural effusion. No nodules or masses. AORTIC ARCH: There is no calcific atherosclerosis of the aortic arch. There is no aneurysm, dissection or hemodynamically significant stenosis of the visualized portion of the aorta. Conventional 3 vessel aortic branching pattern. The visualized proximal subclavian arteries are widely patent. RIGHT CAROTID SYSTEM: Normal without aneurysm, dissection or stenosis. LEFT CAROTID SYSTEM: Normal without aneurysm, dissection or stenosis. VERTEBRAL ARTERIES: Left dominant configuration. Both origins are clearly patent. There is no dissection, occlusion or flow-limiting stenosis to the skull base (V1-V3 segments). CTA HEAD FINDINGS POSTERIOR CIRCULATION: --Vertebral arteries: Normal V4 segments. --Inferior cerebellar arteries: Normal. --Basilar artery: Normal. --Superior cerebellar arteries: Normal. --Posterior cerebral arteries (PCA): Normal. ANTERIOR CIRCULATION: --Intracranial internal carotid arteries: Normal. --Anterior cerebral arteries (ACA): Normal. Both A1 segments are present. Patent anterior communicating artery (a-comm). --Middle cerebral arteries (MCA): There is a distal right MCA M1 segment occlusion. Left MCA is  normal. VENOUS SINUSES: As permitted by contrast timing, patent. ANATOMIC VARIANTS: Fetal origin of the right posterior cerebral artery. There is moderate collateral flow in the right MCA territory. Review of the MIP images confirms the above findings. IMPRESSION: 1. Distal right MCA M1 segment occlusion with moderate collateral flow in the right MCA territory. 2. No other intracranial arterial occlusion or high-grade stenosis. 3. Normal cervical carotid and vertebral arteries. Critical Value/emergent results were called by telephone at the time of interpretation on 03/23/2020 at 11:13 pm to provider SSamara Snide, who verbally acknowledged these results. Electronically Signed   By: KUlyses JarredM.D.   On: 03/23/2020 23:17   DG Abd 1 View  Result Date: 03/24/2020 CLINICAL DATA:  Vomiting. EXAM: ABDOMEN - 1 VIEW COMPARISON:  Abdominal x-ray dated September 25, 2015. FINDINGS: The bowel gas pattern is normal. No radio-opaque calculi or other significant radiographic abnormality are seen. Excreted contrast in the renal collecting systems, ureters, and bladder. Moderate right and mild left hydroureteronephrosis. No acute osseous abnormality. IMPRESSION: 1. No obstruction. 2. Moderate right and mild left hydroureteronephrosis. Electronically Signed   By: WTitus DubinM.D.   On: 03/24/2020 16:42   CT HEAD WO CONTRAST  Result Date: 03/24/2020 CLINICAL DATA:  Stroke follow-up. Encephalopathy. Right MCA thrombectomy 03/24/2020 EXAM: CT HEAD WITHOUT CONTRAST TECHNIQUE: Contiguous axial images were obtained from  the base of the skull through the vertex without intravenous contrast. COMPARISON:  CT head 03/24/2020 at 1201 hours. CT head 03/24/2020 at 0224 hours. FINDINGS: Brain: Progressive edema right MCA territory consistent with acute infarction. This involves the insula, superior temporal lobe, and frontal parietal cortex. No associated hemorrhage or midline shift. Low-density in the anterior frontal lobes  bilaterally is symmetric and is felt to be due to artifact from motion and beam hardening. Low-density in the left frontal white matter compatible with chronic ischemia. Ventricle size normal. Vascular: Hyperdensity in the left M2 branch in the sylvian fissure compatible with thrombosis. Negative for hyperdense M1 segment. Skull: No focal skeletal lesion. Sinuses/Orbits: Paranasal sinuses clear. Bilateral cataract extraction. Other: None IMPRESSION: Progressive low-density in the right MCA territory compatible with acute infarct. Sparing of the basal ganglia. No associated hemorrhage. Hyperdense right M2 segment sylvian fissure compatible with thrombosis. Electronically Signed   By: Franchot Gallo M.D.   On: 03/24/2020 21:59   CT HEAD WO CONTRAST  Result Date: 03/24/2020 CLINICAL DATA:  Stroke. Left arm weakness and facial droop. Right M1 occlusion yesterday with subsequent vascular intervention. EXAM: CT HEAD WITHOUT CONTRAST TECHNIQUE: Contiguous axial images were obtained from the base of the skull through the vertex without intravenous contrast. COMPARISON:  Head CT earlier same day FINDINGS: Brain: Developing low-density with loss of gray-white differentiation throughout the right middle cerebral artery territory affecting the insula, frontal operculum and right frontoparietal cortical and subcortical brain. Sparing of the basal ganglia. No evidence of hemorrhage or midline shift. Chronic small-vessel ischemic changes elsewhere affect the cerebral hemispheric white matter. Old right cerebellar infarction. No hydrocephalus. No extra-axial collection. Vascular: There is atherosclerotic calcification of the major vessels at the base of the brain. Skull: Negative Sinuses/Orbits: Clear/normal Other: None IMPRESSION: Changes of acute infarction now evident by CT affecting the right middle cerebral artery territory with sparing of the basal ganglia. No hemorrhage. No midline shift. Electronically Signed   By: Nelson Chimes M.D.   On: 03/24/2020 12:32   CT HEAD WO CONTRAST  Result Date: 03/24/2020 CLINICAL DATA:  Status post thrombectomy EXAM: CT HEAD WITHOUT CONTRAST TECHNIQUE: Contiguous axial images were obtained from the base of the skull through the vertex without intravenous contrast. COMPARISON:  None. FINDINGS: Brain: There is no mass, hemorrhage or extra-axial collection. The size and configuration of the ventricles and extra-axial CSF spaces are normal. There is hypoattenuation of the white matter, most commonly indicating chronic small vessel disease. Vascular: Expected intravascular enhancement. Skull: The visualized skull base, calvarium and extracranial soft tissues are normal. Sinuses/Orbits: No fluid levels or advanced mucosal thickening of the visualized paranasal sinuses. No mastoid or middle ear effusion. The orbits are normal. IMPRESSION: 1. No acute intracranial abnormality. 2. Chronic small vessel disease. Electronically Signed   By: Ulyses Jarred M.D.   On: 03/24/2020 02:34   CT ANGIO NECK W OR WO CONTRAST  Result Date: 03/23/2020 CLINICAL DATA:  Left arm weakness and facial droop EXAM: CT ANGIOGRAPHY HEAD AND NECK TECHNIQUE: Multidetector CT imaging of the head and neck was performed using the standard protocol during bolus administration of intravenous contrast. Multiplanar CT image reconstructions and MIPs were obtained to evaluate the vascular anatomy. Carotid stenosis measurements (when applicable) are obtained utilizing NASCET criteria, using the distal internal carotid diameter as the denominator. CONTRAST:  65m OMNIPAQUE IOHEXOL 350 MG/ML SOLN COMPARISON:  None. FINDINGS: CTA NECK FINDINGS SKELETON: There is no bony spinal canal stenosis. No lytic or blastic lesion. OTHER  NECK: Normal pharynx, larynx and major salivary glands. No cervical lymphadenopathy. Unremarkable thyroid gland. UPPER CHEST: No pneumothorax or pleural effusion. No nodules or masses. AORTIC ARCH: There is no calcific  atherosclerosis of the aortic arch. There is no aneurysm, dissection or hemodynamically significant stenosis of the visualized portion of the aorta. Conventional 3 vessel aortic branching pattern. The visualized proximal subclavian arteries are widely patent. RIGHT CAROTID SYSTEM: Normal without aneurysm, dissection or stenosis. LEFT CAROTID SYSTEM: Normal without aneurysm, dissection or stenosis. VERTEBRAL ARTERIES: Left dominant configuration. Both origins are clearly patent. There is no dissection, occlusion or flow-limiting stenosis to the skull base (V1-V3 segments). CTA HEAD FINDINGS POSTERIOR CIRCULATION: --Vertebral arteries: Normal V4 segments. --Inferior cerebellar arteries: Normal. --Basilar artery: Normal. --Superior cerebellar arteries: Normal. --Posterior cerebral arteries (PCA): Normal. ANTERIOR CIRCULATION: --Intracranial internal carotid arteries: Normal. --Anterior cerebral arteries (ACA): Normal. Both A1 segments are present. Patent anterior communicating artery (a-comm). --Middle cerebral arteries (MCA): There is a distal right MCA M1 segment occlusion. Left MCA is normal. VENOUS SINUSES: As permitted by contrast timing, patent. ANATOMIC VARIANTS: Fetal origin of the right posterior cerebral artery. There is moderate collateral flow in the right MCA territory. Review of the MIP images confirms the above findings. IMPRESSION: 1. Distal right MCA M1 segment occlusion with moderate collateral flow in the right MCA territory. 2. No other intracranial arterial occlusion or high-grade stenosis. 3. Normal cervical carotid and vertebral arteries. Critical Value/emergent results were called by telephone at the time of interpretation on 03/23/2020 at 11:13 pm to provider Samara Snide , who verbally acknowledged these results. Electronically Signed   By: Ulyses Jarred M.D.   On: 03/23/2020 23:17   US RENAL  Result Date: 03/24/2020 CLINICAL DATA:  Acute kidney injury.  Elevated BUN and creatinine. EXAM:  RENAL / URINARY TRACT ULTRASOUND COMPLETE COMPARISON:  None. FINDINGS: Right Kidney: Renal measurements: 9.7 x 4.7 x 4.2 cm. = volume: 97 mL. Normal echogenicity. Upper pole exophytic cyst measures 3.5 x 3.2 x 3.6 cm. No hydronephrosis Left Kidney: Renal measurements: 9.7 x 5.5 x 4.8 cm = volume: 132.8 mL. Normal echogenicity. No mass or hydronephrosis. Inferior pole cyst measures 1.8 x 1.7 x 1.6 cm. Bladder: Collapsed urinary bladder is not visualized. Other: None. IMPRESSION: 1. No acute abnormality. 2. Asymmetric right atrophy. 3. Bilateral kidney cysts. Electronically Signed   By: Kerby Moors M.D.   On: 03/24/2020 22:54   DG CHEST PORT 1 VIEW  Result Date: 03/24/2020 CLINICAL DATA:  Stroke.  Aspiration. EXAM: PORTABLE CHEST 1 VIEW COMPARISON:  Earlier same day FINDINGS: Endotracheal tube is been removed. Heart size remains normal. No pulmonary edema. The patient has not taken a deep inspiration. There may be mild atelectasis at the right lung base but there is no dense consolidation or lobar collapse. No effusion. Surgical clips again noted at the right thoracic inlet. IMPRESSION: Extubated. Mild atelectasis at the right lung base. No dense consolidation or lobar collapse. Electronically Signed   By: Nelson Chimes M.D.   On: 03/24/2020 13:07   DG CHEST PORT 1 VIEW  Result Date: 03/24/2020 CLINICAL DATA:  Endotracheal tube placement. EXAM: PORTABLE CHEST 1 VIEW COMPARISON:  August 19, 2018 FINDINGS: The endotracheal tube terminates above the carina by approximately 3 cm. The heart size is unchanged from prior study. The lung volumes are low. Aortic calcifications are noted. There is no pneumothorax. Bibasilar atelectasis is noted. IMPRESSION: 1. Endotracheal tube as above. 2. Low lung volumes with bibasilar atelectasis. Electronically Signed   By:  Constance Holster M.D.   On: 03/24/2020 03:54   ECHOCARDIOGRAM COMPLETE  Result Date: 03/24/2020    ECHOCARDIOGRAM REPORT   Patient Name:   ELLAMAE LYBECK Date of Exam: 03/24/2020 Medical Rec #:  [id number]        Height:       67.0 in Accession #:    [id number]       Weight:       169.8 lb Date of Birth:  [birth year]        BSA:          1.886 m Patient Age:    74 years         BP:           115/66 mmHg Patient Gender: F                HR:           85 bpm. Exam Location:  Inpatient Procedure: 2D Echo, Cardiac Doppler, Color Doppler and Intracardiac            Opacification Agent Indications:    Stroke 434.91 / I163.9  History:        Patient has no prior history of Echocardiogram examinations.                 Risk Factors:Hypertension and Diabetes. Hypothyrodism.  Sonographer:    Tiffany Dance Referring Phys: [id number] R AROOR  Sonographer Comments: Echo performed with patient supine and on artificial respirator. IMPRESSIONS  1. Left ventricular ejection fraction, by estimation, is 70 to 75%. The left ventricle has hyperdynamic function with intracavitary gradient up to 32 mmHg. The left ventricle has no regional wall motion abnormalities. There is mild left ventricular hypertrophy. Left ventricular diastolic parameters are indeterminate.  2. Right ventricular systolic function is normal. The right ventricular size is normal. Tricuspid regurgitation signal is inadequate for assessing PA pressure.  3. The aortic valve is tricuspid. Aortic valve regurgitation is not visualized. Mild aortic valve sclerosis is present, with no evidence of aortic valve stenosis.  4. Aortic dilatation noted. 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  5. The inferior vena cava is normal in size with greater than 50% respiratory variability, suggesting right atrial pressure of 3 mmHg.  6. Appears to be echodensity attached to left atrial side of posterior leaflet of mitral valve (best seen on image 3), but notably only seen on parasternal long axis images. Recomend further evaluation with with TEE to assess for cardioembolic source of  CVA. The mitral  valve is degenerative. Trivial mitral valve regurgitation. FINDINGS  Left Ventricle: Hyperdynamic LV function with intracavitary gradient 32 mmHg. Left ventricular ejection fraction, by estimation, is 70 to 75%. The left ventricle has hyperdynamic function. The left ventricle has no regional wall motion abnormalities. Definity contrast agent was given IV to delineate the left ventricular endocardial borders. The left ventricular internal cavity size was normal in size. There is mild left ventricular hypertrophy. Left ventricular diastolic parameters are indeterminate. Right Ventricle: The right ventricular size is normal. Right vetricular wall thickness was not assessed. Right ventricular systolic function is normal. Tricuspid regurgitation signal is inadequate for assessing PA pressure. Left Atrium: Left atrial size was normal in size. Right Atrium: Right atrial size was normal in size. Pericardium: There is no evidence of pericardial effusion. Mitral Valve: Possible echodensity attached to left atrial side of posterior leaflet of mitral valve (best seen on image 3), but notably only seen  on parasternal long axis images. Recomend further evaluation with with TEE to rule out cardioembolic source  of CVA. The mitral valve is degenerative in appearance. Trivial mitral valve regurgitation. Tricuspid Valve: The tricuspid valve is normal in structure. Tricuspid valve regurgitation is not demonstrated. Aortic Valve: The aortic valve is tricuspid. Aortic valve regurgitation is not visualized. Mild aortic valve sclerosis is present, with no evidence of aortic valve stenosis. Pulmonic Valve: The pulmonic valve was not well visualized. Pulmonic valve regurgitation is not visualized. Aorta: Aortic dilatation noted. 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 Venous: The inferior vena cava is normal in size with greater than 50% respiratory variability, suggesting right atrial pressure of 3 mmHg.  IAS/Shunts: The interatrial septum was not well visualized.  LEFT VENTRICLE PLAX 2D LVIDd:         4.00 cm LVIDs:         2.90 cm LV PW:         1.10 cm LV IVS:        1.00 cm LVOT diam:     1.80 cm LV SV:         86 LV SV Index:   46 LVOT Area:     2.54 cm  RIGHT VENTRICLE             IVC RV Basal diam:  2.10 cm     IVC diam: 1.60 cm RV S prime:     19.60 cm/s TAPSE (M-mode): 1.7 cm LEFT ATRIUM             Index       RIGHT ATRIUM          Index LA diam:        3.40 cm 1.80 cm/m  RA Area:     7.76 cm LA Vol (A2C):   42.5 ml 22.53 ml/m RA Volume:   12.30 ml 6.52 ml/m LA Vol (A4C):   29.3 ml 15.53 ml/m LA Biplane Vol: 37.4 ml 19.83 ml/m  AORTIC VALVE LVOT Vmax:   176.00 cm/s LVOT Vmean:  123.000 cm/s LVOT VTI:    0.338 m  AORTA Ao Root diam: 3.10 cm Ao Asc diam:  3.60 cm MITRAL VALVE MV Area (PHT): 2.60 cm     SHUNTS MV Decel Time: 292 msec     Systemic VTI:  0.34 m MV E velocity: 62.80 cm/s   Systemic Diam: 1.80 cm MV A velocity: 104.00 cm/s MV E/A ratio:  0.60 Oswaldo Milian MD Electronically signed by Oswaldo Milian MD Signature Date/Time: 03/24/2020/10:42:15 AM    Final    CT HEAD CODE STROKE WO CONTRAST  Result Date: 03/23/2020 CLINICAL DATA:  Code stroke.  Left arm weakness and facial droop. EXAM: CT HEAD WITHOUT CONTRAST TECHNIQUE: Contiguous axial images were obtained from the base of the skull through the vertex without intravenous contrast. COMPARISON:  None. FINDINGS: Brain: There is no mass, hemorrhage or extra-axial collection. The size and configuration of the ventricles and extra-axial CSF spaces are normal. There is hypoattenuation of the periventricular white matter, most commonly indicating chronic ischemic microangiopathy. Vascular: No abnormal hyperdensity of the major intracranial arteries or dural venous sinuses. No intracranial atherosclerosis. Skull: The visualized skull base, calvarium and extracranial soft tissues are normal. Sinuses/Orbits: No fluid levels or advanced  mucosal thickening of the visualized paranasal sinuses. No mastoid or middle ear effusion. The orbits are normal. ASPECTS [facility] Stroke Program Early CT Score) - Ganglionic level infarction (caudate, lentiform nuclei, internal capsule,  insula, M1-M3 cortex): 7 - Supraganglionic infarction (M4-M6 cortex): 3 Total score (0-10 with 10 being normal): 10 IMPRESSION: 1. No acute intracranial abnormality 2. ASPECTS is 10. These results were communicated to Dr. Karena Addison Aroor at 10:41 pm on 03/23/2020 by text page via the [facility] messaging system. Electronically Signed   By: Ulyses Jarred M.D.   On: 03/23/2020 22:41   VAS [place] LOWER EXTREMITY VENOUS (DVT)  Result Date: 03/24/2020  Lower Venous DVTStudy Indications: Stroke.  Limitations: Patient positioning. Comparison Study: 05/26/18 previous Performing Technologist: Abram Sander RVS  Examination Guidelines: A complete evaluation includes B-mode imaging, spectral Doppler, color Doppler, and power Doppler as needed of all accessible portions of each vessel. Bilateral testing is considered an integral part of a complete examination. Limited examinations for reoccurring indications may be performed as noted. The reflux portion of the exam is performed with the patient in reverse Trendelenburg.  +---------+---------------+---------+-----------+----------+--------------+ RIGHT    CompressibilityPhasicitySpontaneityPropertiesThrombus Aging +---------+---------------+---------+-----------+----------+--------------+ CFV      Full           Yes      Yes                                 +---------+---------------+---------+-----------+----------+--------------+ SFJ      Full                                                        +---------+---------------+---------+-----------+----------+--------------+ FV Prox  Full                                                        +---------+---------------+---------+-----------+----------+--------------+ FV Mid    Full                                                        +---------+---------------+---------+-----------+----------+--------------+ FV DistalFull                                                        +---------+---------------+---------+-----------+----------+--------------+ PFV      Full                                                        +---------+---------------+---------+-----------+----------+--------------+ POP      Full           Yes      Yes                                 +---------+---------------+---------+-----------+----------+--------------+ PTV      Full                                                        +---------+---------------+---------+-----------+----------+--------------+  PERO     Full                                                        +---------+---------------+---------+-----------+----------+--------------+   +---------+---------------+---------+-----------+----------+--------------+ LEFT     CompressibilityPhasicitySpontaneityPropertiesThrombus Aging +---------+---------------+---------+-----------+----------+--------------+ CFV      Full           Yes      Yes                                 +---------+---------------+---------+-----------+----------+--------------+ SFJ      Full                                                        +---------+---------------+---------+-----------+----------+--------------+ FV Prox  Full                                                        +---------+---------------+---------+-----------+----------+--------------+ FV Mid   Full                                                        +---------+---------------+---------+-----------+----------+--------------+ FV Distal               Yes      Yes                                 +---------+---------------+---------+-----------+----------+--------------+ PFV      Full                                                         +---------+---------------+---------+-----------+----------+--------------+ POP      Full           Yes      Yes                                 +---------+---------------+---------+-----------+----------+--------------+ PTV      Full                                                        +---------+---------------+---------+-----------+----------+--------------+ PERO  Not visualized +---------+---------------+---------+-----------+----------+--------------+     Summary: BILATERAL: - No evidence of deep vein thrombosis seen in the lower extremities, bilaterally. -   *See table(s) above for measurements and observations. Electronically signed by Curt Jews MD on 03/24/2020 at 5:33:26 PM.    Final     Labs:  CBC: Recent Labs    03/23/20 2227 03/23/20 2229 03/24/20 0450 03/24/20 1527 03/24/20 1859 03/25/20 0055  WBC 9.4  --  12.2* 18.6*  --  14.3*  HGB 13.4   < > 12.1 12.0 10.5* 11.1*  HCT 40.8   < > 34.8* 35.3* 31.0* 32.8*  PLT 222  --  224 216  --  187   < > = values in this interval not displayed.    COAGS: Recent Labs    03/23/20 2227 03/24/20 1527  INR 1.0 1.1  APTT 27  --     BMP: Recent Labs    03/23/20 2227 03/23/20 2227 03/23/20 2229 03/24/20 0337 03/24/20 0450 03/24/20 1527 03/24/20 1859 03/25/20 0055  NA 138   < > 142   < > 136 138 143 139  K 4.2   < > 4.2   < > 4.0 4.0 3.9 3.9  CL 104   < > 105  --  106 108  --  109  CO2 21*  --   --   --  19* 17*  --  19*  GLUCOSE 316*   < > 302*  --  308* 298*  --  248*  BUN 41*   < > 39*  --  39* 41*  --  33*  CALCIUM 9.3  --   --   --  8.3* 8.6*  --  8.5*  CREATININE 1.70*   < > 1.60*  --  1.54* 1.79*  --  1.70*  GFRNONAA 29*  --   --   --  33* 28*  --  29*  GFRAA 34*  --   --   --  38* 32*  --  34*   < > = values in this interval not displayed.    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 Estanislado Pandy. Patient's condition stable- extubated but obtunded, does not respond to voice/painful stimuli and does not follow simple commands, right side moves spontaneously but no spontaneous movements of right side. Right groin puncture site stable, distal pulses (DPs) 1+ bilaterally. For MRI vs CT today. Further plans per neurology/CCM- appreciate and agree with management. Please call NIR with questions/concerns.   Electronically Signed: Earley Abide, PA-C 03/25/2020, 10:06 AM   I spent a total of 25 Minutes at the the patient's bedside AND on the patient's hospital floor or unit, greater than 50% of which was counseling/coordinating care for CVA s/p revascularization.

## 2020-03-25 NOTE — Consult Note (Addendum)
Cardiology Consultation:   Patient ID: Meredith Shaw; [id number]; 10-03-1946   Admit date: 03/23/2020 Date of Consult: 03/25/2020  Primary Care Provider: Chevis Pretty, [place] Primary Cardiologist: New to [facility]- Dr. Claiborne Billings  Patient Profile:   Meredith Shaw is a 74 y.o. female with a hx of DM2, HTN, and hypothyroidism who is being seen today for the evaluation of mitral valve vegetation at the request of Dr. Erlinda Hong.  History of Present Illness:   Meredith Shaw is a 74yo F has a hx as stated above who presented to [facility] on 03/23/20 with acute onset of left sided weakness and slurred speech. Code Stroke called by ED physician. HPI obtained from chart reviewed given patients acute status. In the ED, vitals were stable. Creatinine was elevated at 1.70. COVID negative. CT was obtained which showed no acute findings. TPA was administered after consent. Unfortunately her NIHSS worsened at which time CT angiogram confirmed right distal M1 occlusion. She was then taken for mechanical thrombectomy per IR. Pt was intubated for procedure. Plan per neurology was to obtain MRI/MRA which is pending. LE doppler studies obtained which were negative. Echocardiogram was performed 03/24/20 which showed echodensity attached to left atrial side of posterior leaflet of mitral valve (best seen on image 3), but notably only seen on parasternal long axis images. Recommendations were for further evaluation with with TEE to assess for cardioembolic source of CVA. The mitral valve is degenerative with trivial mitral valve regurgitation.   On my assessment, patient is arousable but minimally responsive to questioning. She has been extubated with stable vitals. hsT found to be 28>>45 however unable to determine is patient is symptomatic with chest pain or other anginal symptoms. Likely elevation is in the setting of acute CVA, demand ischemia. Initial lactic acid elevated at 4.1 which has down trended to 2.6 today. WBC  stabilizing as well at 14.3 from 18.6.   Cardiology has been asked to follow given MV vegetation.   Past Medical History:  Diagnosis Date  . Arthritis    OA AND PAIN BOTH KNEES -RIGHT KNEE HURTS WORSE THAN LEFT  . Cataract   . Diabetes mellitus    ORAL MEDICATION - NO INSULIN  . Hypertension   . Hypertensive retinopathy    OU  . Hypothyroidism   . Kidney stones    NONE AT PRESENT TIME THAT PT IS AWARE OF  . Osteoporosis     Past Surgical History:  Procedure Laterality Date  . ABDOMINAL HYSTERECTOMY    . CATARACT EXTRACTION    . CHOLECYSTECTOMY  1972  . COLONOSCOPY    . EYE SURGERY     RIGHT CATARACT EXTRACTON WITH LENS IMPLANT  . GROWTH REMOVED FROM THYROID    . INTRAMEDULLARY (IM) NAIL INTERTROCHANTERIC Left 02/10/2018   Procedure: INTRAMEDULLARY (IM) NAIL INTERTROCHANTRIC;  Surgeon: Mcarthur Rossetti, MD;  Location: [place];  Service: Orthopedics;  Laterality: Left;  . INTRAMEDULLARY (IM) NAIL INTERTROCHANTERIC Right 08/20/2018   Procedure: ORIF RIGHT HIP INTERTROC FRACTURE;  Surgeon: Mcarthur Rossetti, MD;  Location: [place];  Service: Orthopedics;  Laterality: Right;  . left hip surgery   02/2018  . ORIF HIP FRACTURE Right 08/20/2018  . PERCUTANEOUS NEPHROLITHOTOMY    . RADIOLOGY WITH ANESTHESIA N/A 03/23/2020   Procedure: IR WITH ANESTHESIA;  Surgeon: Luanne Bras, MD;  Location: [place];  Service: Radiology;  Laterality: N/A;  . TOTAL KNEE ARTHROPLASTY  06/09/2012   Procedure: TOTAL KNEE ARTHROPLASTY;  Surgeon: Mcarthur Rossetti, MD;  Location: [facility];  Service: Orthopedics;  Laterality: Right;  Right Total Knee Arthroplasty  . TOTAL KNEE ARTHROPLASTY Left 04/09/2014   Procedure: LEFT TOTAL KNEE ARTHROPLASTY;  Surgeon: Mcarthur Rossetti, MD;  Location: [place];  Service: Orthopedics;  Laterality: Left;  . TUBAL LIGATION       Prior to Admission medications   Medication Sig Start Date End Date Taking? Authorizing Provider  amLODipine (NORVASC) 5 MG  tablet TAKE ONE (1) TABLET EACH DAY Patient taking differently: Take 5 mg by mouth daily.  01/09/20  Yes Rakes, Connye Burkitt, FNP  aspirin EC 81 MG tablet Take 81 mg by mouth daily.   Yes [provider]  cholecalciferol (VITAMIN D) 1000 units tablet Take 5,000 Units by mouth daily.    Yes [provider]  denosumab (PROLIA) 60 MG/ML SOSY injection Inject 60 mg into the skin every 6 (six) months. 11/15/18  Yes Rakes, Connye Burkitt, FNP  Dulaglutide (TRULICITY) 2.83 TD/1.7OH SOPN Inject 0.75 mg into the skin once a week.    Yes [provider]  glimepiride (AMARYL) 1 MG tablet Take 1 mg by mouth daily with breakfast.  12/27/05  Yes [provider]  ibuprofen (ADVIL) 200 MG tablet Take 400 mg by mouth 2 (two) times daily as needed for moderate pain.  05/12/05  Yes [provider]  levothyroxine (SYNTHROID) 125 MCG tablet Take 1 tablet (125 mcg total) by mouth daily. 11/19/19  Yes Rakes, Connye Burkitt, FNP  lisinopril (ZESTRIL) 20 MG tablet Take 1 tablet (20 mg total) by mouth daily. 03/04/20  Yes Hassell Done, Mary-Margaret, FNP  metFORMIN (GLUCOPHAGE) 500 MG tablet Take 1 tablet (500 mg total) by mouth 2 (two) times daily with a meal. Patient taking differently: Take 500 mg by mouth in the morning and at bedtime.  02/26/20 05/26/20 Yes Dettinger, Fransisca Kaufmann, MD  metoprolol tartrate (LOPRESSOR) 100 MG tablet TAKE ONE TABLET BY MOUTH TWICE DAILY Patient taking differently: Take 100 mg by mouth 2 (two) times daily.  01/09/20  Yes Rakes, Connye Burkitt, FNP  naproxen sodium (ALEVE) 220 MG tablet Take 220 mg by mouth 2 (two) times daily as needed (for pain).   Yes [provider]  Omega-3 Fatty Acids (FISH OIL PO) Take 1 capsule by mouth daily.   Yes [provider]  pravastatin (PRAVACHOL) 40 MG tablet Take 1 tablet (40 mg total) by mouth at bedtime. 08/15/19  Yes Rakes, Connye Burkitt, FNP  Blood Glucose Monitoring Suppl (ONETOUCH VERIO) w/Device KIT 1 each by Does not apply route daily.  03/10/18   Eustaquio Maize, MD  glucose blood [facility] VERIO) test strip Use as instructed 03/10/18   Eustaquio Maize, MD  Lancets [facility] ULTRASOFT) lancets Use as instructed 03/10/18   Eustaquio Maize, MD    Inpatient Medications: Scheduled Meds: . aspirin  300 mg Rectal Daily  . Chlorhexidine Gluconate Cloth  6 each Topical Daily  . docusate  100 mg Oral BID  . feeding supplement (PRO-STAT SUGAR FREE 64)  30 mL Per Tube BID  . insulin aspart  0-20 Units Subcutaneous Q4H  . mouth rinse  15 mL Mouth Rinse BID  . pantoprazole (PROTONIX) IV  40 mg Intravenous Q12H  . polyethylene glycol  17 g Oral Daily   Continuous Infusions: . sodium chloride 100 mL/hr at 03/25/20 1300  . acetaminophen Stopped (03/25/20 1233)  . clevidipine Stopped (03/25/20 0956)  . [START ON 03/26/2020] feeding supplement (VITAL 1.5 CAL)    . meropenem (MERREM)  IV Stopped (03/25/20 0338)   PRN Meds: acetaminophen **OR** acetaminophen (TYLENOL) oral liquid 160 mg/5 mL **OR** acetaminophen, metoprolol tartrate, ondansetron (ZOFRAN) IV, senna-docusate  Allergies:    Allergies  Allergen Reactions  . Semaglutide Nausea And Vomiting    PO Rybelsus  . Farxiga [Dapagliflozin]     Yeast infections  . Penicillins Itching and Rash    Has tolerated Cefazlin and Ceftriaxone in past     Social History:   Social History   Socioeconomic History  . Marital status: Married    Spouse name: Berneta Sages   . Number of children: 2  . Years of education: 36  . Highest education level: Some college, no degree  Occupational History  . Occupation: retired     Fish farm manager: [employer]    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    Comment: bank - 47yr  Tobacco Use  . Smoking status: Never Smoker  . Smokeless tobacco: Never Used  Vaping Use  . Vaping Use: Never used  Substance and Sexual Activity  . Alcohol use: No  . Drug use: No  . Sexual activity: Yes    Birth control/protection: Surgical  Other Topics Concern  . Not on file  Social  History Narrative  . Not on file   Social Determinants of Health   Financial Resource Strain: Low Risk   . Difficulty of Paying Living Expenses: Not hard at all  Food Insecurity: No Food Insecurity  . Worried About RCharity fundraiserin the Last Year: Never true  . Ran Out of Food in the Last Year: Never true  Transportation Needs: No Transportation Needs  . Lack of Transportation (Medical): No  . Lack of Transportation (Non-Medical): No  Physical Activity: Sufficiently Active  . Days of Exercise per Week: 7 days  . Minutes of Exercise per Session: 30 min  Stress: No Stress Concern Present  . Feeling of Stress : Not at all  Social Connections: Socially Integrated  . Frequency of Communication with Friends and Family: More than three times a week  . Frequency of Social Gatherings with Friends and Family: More than three times a week  . Attends Religious Services: More than 4 times per year  . Active Member of Clubs or Organizations: Yes  . Attends CArchivistMeetings: More than 4 times per year  . Marital Status: Married  IHuman resources officerViolence: Not At Risk  . Fear of Current or Ex-Partner: No  . Emotionally Abused: No  . Physically Abused: No  . Sexually Abused: No    Family History:   Family History  Problem Relation Age of Onset  . Heart disease Mother   . Breast cancer Mother        648's . Stroke Father   . Heart disease Father   . Healthy Daughter   . Healthy Son   . Liver disease Maternal Grandmother        gall stones imbedded in liver - blead to death when in surgery   . Pneumonia Maternal Grandfather   . Diabetes Paternal Grandmother    Family Status:  Family Status  Relation Name Status  . Mother  Deceased at age 438      multiple health problems  . Father  Deceased at age 74      MI and stroke  . Daughter  Alive  . Son  Alive  . MGM  Deceased  . MGF  Deceased at age 74 . PGM  Deceased  .  PGF  Deceased    ROS:  Please see the  history of present illness.  All other ROS reviewed and negative.     Physical Exam/Data:   Vitals:   03/25/20 1000 03/25/20 1100 03/25/20 1200 03/25/20 1300  BP: (!) 135/91 (!) 145/70 131/75 (!) 151/75  Pulse: 96 (!) 111 98 99  Resp: 18 (!) 21 20 (!) 23  Temp:   100 F (37.8 C)   TempSrc:      SpO2: 96% 98% 100% 97%  Weight:        Intake/Output Summary (Last 24 hours) at 03/25/2020 1340 Last data filed at 03/25/2020 1300 Gross per 24 hour  Intake 3929.77 ml  Output 1825 ml  Net 2104.77 ml   Filed Weights   03/23/20 2233  Weight: 77 kg   Body mass index is 26.59 kg/m.   General: Well developed, well nourished, NAD Neck: Negative for carotid bruits. No JVD Lungs: Expiratory wheezing. Breathing is unlabored. Cardiovascular: RRR with S1 S2. No murmurs.  Abdomen: Soft, non-tender, non-distended. No obvious abdominal masses. Extremities: No edema. Radial pulses 2+ bilaterally Neuro: Unresponsive  Psych: Unresponsive   EKG:  The EKG was personally reviewed and demonstrates: 03/24/20 ST with HR 118bpm with baseline wonder. Non-specific TW abnormalities.  Telemetry:  Telemetry was personally reviewed and demonstrates: 03/25/20 NSR HR 80s   Relevant CV Studies:  Echocardiogram 03/24/20:  1. Left ventricular ejection fraction, by estimation, is 70 to 75%. The  left ventricle has hyperdynamic function with intracavitary gradient up to  32 mmHg. The left ventricle has no regional wall motion abnormalities.  There is mild left ventricular  hypertrophy. Left ventricular diastolic parameters are indeterminate.  2. Right ventricular systolic function is normal. The right ventricular  size is normal. Tricuspid regurgitation signal is inadequate for assessing  PA pressure.  3. The aortic valve is tricuspid. Aortic valve regurgitation is not  visualized. Mild aortic valve sclerosis is present, with no evidence of  aortic valve stenosis.  4. Aortic dilatation noted. 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  5. The inferior vena cava is normal in size with greater than 50%  respiratory variability, suggesting right atrial pressure of 3 mmHg.  6. Appears to be echodensity attached to left atrial side of posterior  leaflet of mitral valve (best seen on image 3), but notably only seen on  parasternal long axis images. Recomend further evaluation with with TEE to  assess for cardioembolic source of  CVA. The mitral valve is degenerative. Trivial mitral valve  regurgitation.   Laboratory Data:  Chemistry Recent Labs  Lab 03/24/20 0450 03/24/20 0450 03/24/20 1527 03/24/20 1859 03/25/20 0055  NA 136   < > 138 143 139  K 4.0   < > 4.0 3.9 3.9  CL 106  --  108  --  109  CO2 19*  --  17*  --  19*  GLUCOSE 308*  --  298*  --  248*  BUN 39*  --  41*  --  33*  CREATININE 1.54*  --  1.79*  --  1.70*  CALCIUM 8.3*  --  8.6*  --  8.5*  GFRNONAA 33*  --  28*  --  29*  GFRAA 38*  --  32*  --  34*  ANIONGAP 11  --  13  --  11   < > = values in this interval not displayed.    Total Protein  Date Value Ref  Range Status  03/25/2020 5.9 (L) 6.5 - 8.1 g/dL Final  02/26/2020 6.3 6.0 - 8.5 g/dL Final   Albumin  Date Value Ref Range Status  03/25/2020 2.6 (L) 3.5 - 5.0 g/dL Final  02/26/2020 3.8 3.7 - 4.7 g/dL Final   AST  Date Value Ref Range Status  03/25/2020 20 15 - 41 U/L Final   ALT  Date Value Ref Range Status  03/25/2020 13 0 - 44 U/L Final   Alkaline Phosphatase  Date Value Ref Range Status  03/25/2020 45 38 - 126 U/L Final   Total Bilirubin  Date Value Ref Range Status  03/25/2020 0.6 0.3 - 1.2 mg/dL Final   Bilirubin Total  Date Value Ref Range Status  02/26/2020 0.3 0.0 - 1.2 mg/dL Final   Hematology Recent Labs  Lab 03/24/20 0450 03/24/20 0450 03/24/20 1527 03/24/20 1859 03/25/20 0055  WBC 12.2*  --  18.6*  --  14.3*  RBC 3.96  --  4.03  --  3.74*  HGB 12.1   < > 12.0 10.5* 11.1*  HCT 34.8*   < >  35.3* 31.0* 32.8*  MCV 87.9  --  87.6  --  87.7  MCH 30.6  --  29.8  --  29.7  MCHC 34.8  --  34.0  --  33.8  RDW 13.5  --  13.6  --  13.8  PLT 224  --  216  --  187   < > = values in this interval not displayed.   Cardiac EnzymesNo results for input(s): TROPONINI in the last 168 hours. No results for input(s): TROPIPOC in the last 168 hours.  BNPNo results for input(s): BNP, PROBNP in the last 168 hours.  DDimer No results for input(s): DDIMER in the last 168 hours. TSH:  Lab Results  Component Value Date   TSH 0.693 02/21/2020   Lipids: Lab Results  Component Value Date   CHOL 156 03/24/2020   HDL 27 (L) 03/24/2020   LDLCALC UNABLE TO CALCULATE IF TRIGLYCERIDE OVER 400 mg/dL 03/24/2020   LDLDIRECT 63.8 03/24/2020   TRIG 618 (H) 03/24/2020   TRIG 605 (H) 03/24/2020   CHOLHDL 5.8 03/24/2020   HgbA1c: Lab Results  Component Value Date   HGBA1C 8.9 (H) 03/24/2020    Radiology/Studies:  CT ANGIO HEAD W OR WO CONTRAST  Result Date: 03/23/2020 CLINICAL DATA:  Left arm weakness and facial droop EXAM: CT ANGIOGRAPHY HEAD AND NECK TECHNIQUE: Multidetector CT imaging of the head and neck was performed using the standard protocol during bolus administration of intravenous contrast. Multiplanar CT image reconstructions and MIPs were obtained to evaluate the vascular anatomy. Carotid stenosis measurements (when applicable) are obtained utilizing NASCET criteria, using the distal internal carotid diameter as the denominator. CONTRAST:  35m OMNIPAQUE IOHEXOL 350 MG/ML SOLN COMPARISON:  None. FINDINGS: CTA NECK FINDINGS SKELETON: There is no bony spinal canal stenosis. No lytic or blastic lesion. OTHER NECK: Normal pharynx, larynx and major salivary glands. No cervical lymphadenopathy. Unremarkable thyroid gland. UPPER CHEST: No pneumothorax or pleural effusion. No nodules or masses. AORTIC ARCH: There is no calcific atherosclerosis of the aortic arch. There is no aneurysm, dissection or  hemodynamically significant stenosis of the visualized portion of the aorta. Conventional 3 vessel aortic branching pattern. The visualized proximal subclavian arteries are widely patent. RIGHT CAROTID SYSTEM: Normal without aneurysm, dissection or stenosis. LEFT CAROTID SYSTEM: Normal without aneurysm, dissection or stenosis. VERTEBRAL ARTERIES: Left dominant configuration. Both origins are clearly patent. There is no dissection,  occlusion or flow-limiting stenosis to the skull base (V1-V3 segments). CTA HEAD FINDINGS POSTERIOR CIRCULATION: --Vertebral arteries: Normal V4 segments. --Inferior cerebellar arteries: Normal. --Basilar artery: Normal. --Superior cerebellar arteries: Normal. --Posterior cerebral arteries (PCA): Normal. ANTERIOR CIRCULATION: --Intracranial internal carotid arteries: Normal. --Anterior cerebral arteries (ACA): Normal. Both A1 segments are present. Patent anterior communicating artery (a-comm). --Middle cerebral arteries (MCA): There is a distal right MCA M1 segment occlusion. Left MCA is normal. VENOUS SINUSES: As permitted by contrast timing, patent. ANATOMIC VARIANTS: Fetal origin of the right posterior cerebral artery. There is moderate collateral flow in the right MCA territory. Review of the MIP images confirms the above findings. IMPRESSION: 1. Distal right MCA M1 segment occlusion with moderate collateral flow in the right MCA territory. 2. No other intracranial arterial occlusion or high-grade stenosis. 3. Normal cervical carotid and vertebral arteries. Critical Value/emergent results were called by telephone at the time of interpretation on 03/23/2020 at 11:13 pm to provider Samara Snide , who verbally acknowledged these results. Electronically Signed   By: Ulyses Jarred M.D.   On: 03/23/2020 23:17   DG Abd 1 View  Result Date: 03/24/2020 CLINICAL DATA:  Vomiting. EXAM: ABDOMEN - 1 VIEW COMPARISON:  Abdominal x-ray dated September 25, 2015. FINDINGS: The bowel gas pattern is  normal. No radio-opaque calculi or other significant radiographic abnormality are seen. Excreted contrast in the renal collecting systems, ureters, and bladder. Moderate right and mild left hydroureteronephrosis. No acute osseous abnormality. IMPRESSION: 1. No obstruction. 2. Moderate right and mild left hydroureteronephrosis. Electronically Signed   By: Titus Dubin M.D.   On: 03/24/2020 16:42   CT HEAD WO CONTRAST  Result Date: 03/24/2020 CLINICAL DATA:  Stroke follow-up. Encephalopathy. Right MCA thrombectomy 03/24/2020 EXAM: CT HEAD WITHOUT CONTRAST TECHNIQUE: Contiguous axial images were obtained from the base of the skull through the vertex without intravenous contrast. COMPARISON:  CT head 03/24/2020 at 1201 hours. CT head 03/24/2020 at 0224 hours. FINDINGS: Brain: Progressive edema right MCA territory consistent with acute infarction. This involves the insula, superior temporal lobe, and frontal parietal cortex. No associated hemorrhage or midline shift. Low-density in the anterior frontal lobes bilaterally is symmetric and is felt to be due to artifact from motion and beam hardening. Low-density in the left frontal white matter compatible with chronic ischemia. Ventricle size normal. Vascular: Hyperdensity in the left M2 branch in the sylvian fissure compatible with thrombosis. Negative for hyperdense M1 segment. Skull: No focal skeletal lesion. Sinuses/Orbits: Paranasal sinuses clear. Bilateral cataract extraction. Other: None IMPRESSION: Progressive low-density in the right MCA territory compatible with acute infarct. Sparing of the basal ganglia. No associated hemorrhage. Hyperdense right M2 segment sylvian fissure compatible with thrombosis. Electronically Signed   By: Franchot Gallo M.D.   On: 03/24/2020 21:59   CT HEAD WO CONTRAST  Result Date: 03/24/2020 CLINICAL DATA:  Stroke. Left arm weakness and facial droop. Right M1 occlusion yesterday with subsequent vascular intervention. EXAM: CT  HEAD WITHOUT CONTRAST TECHNIQUE: Contiguous axial images were obtained from the base of the skull through the vertex without intravenous contrast. COMPARISON:  Head CT earlier same day FINDINGS: Brain: Developing low-density with loss of gray-white differentiation throughout the right middle cerebral artery territory affecting the insula, frontal operculum and right frontoparietal cortical and subcortical brain. Sparing of the basal ganglia. No evidence of hemorrhage or midline shift. Chronic small-vessel ischemic changes elsewhere affect the cerebral hemispheric white matter. Old right cerebellar infarction. No hydrocephalus. No extra-axial collection. Vascular: There is atherosclerotic calcification of the major  vessels at the base of the brain. Skull: Negative Sinuses/Orbits: Clear/normal Other: None IMPRESSION: Changes of acute infarction now evident by CT affecting the right middle cerebral artery territory with sparing of the basal ganglia. No hemorrhage. No midline shift. Electronically Signed   By: Nelson Chimes M.D.   On: 03/24/2020 12:32   CT HEAD WO CONTRAST  Result Date: 03/24/2020 CLINICAL DATA:  Status post thrombectomy EXAM: CT HEAD WITHOUT CONTRAST TECHNIQUE: Contiguous axial images were obtained from the base of the skull through the vertex without intravenous contrast. COMPARISON:  None. FINDINGS: Brain: There is no mass, hemorrhage or extra-axial collection. The size and configuration of the ventricles and extra-axial CSF spaces are normal. There is hypoattenuation of the white matter, most commonly indicating chronic small vessel disease. Vascular: Expected intravascular enhancement. Skull: The visualized skull base, calvarium and extracranial soft tissues are normal. Sinuses/Orbits: No fluid levels or advanced mucosal thickening of the visualized paranasal sinuses. No mastoid or middle ear effusion. The orbits are normal. IMPRESSION: 1. No acute intracranial abnormality. 2. Chronic small  vessel disease. Electronically Signed   By: Ulyses Jarred M.D.   On: 03/24/2020 02:34   CT ANGIO NECK W OR WO CONTRAST  Result Date: 03/23/2020 CLINICAL DATA:  Left arm weakness and facial droop EXAM: CT ANGIOGRAPHY HEAD AND NECK TECHNIQUE: Multidetector CT imaging of the head and neck was performed using the standard protocol during bolus administration of intravenous contrast. Multiplanar CT image reconstructions and MIPs were obtained to evaluate the vascular anatomy. Carotid stenosis measurements (when applicable) are obtained utilizing NASCET criteria, using the distal internal carotid diameter as the denominator. CONTRAST:  35m OMNIPAQUE IOHEXOL 350 MG/ML SOLN COMPARISON:  None. FINDINGS: CTA NECK FINDINGS SKELETON: There is no bony spinal canal stenosis. No lytic or blastic lesion. OTHER NECK: Normal pharynx, larynx and major salivary glands. No cervical lymphadenopathy. Unremarkable thyroid gland. UPPER CHEST: No pneumothorax or pleural effusion. No nodules or masses. AORTIC ARCH: There is no calcific atherosclerosis of the aortic arch. There is no aneurysm, dissection or hemodynamically significant stenosis of the visualized portion of the aorta. Conventional 3 vessel aortic branching pattern. The visualized proximal subclavian arteries are widely patent. RIGHT CAROTID SYSTEM: Normal without aneurysm, dissection or stenosis. LEFT CAROTID SYSTEM: Normal without aneurysm, dissection or stenosis. VERTEBRAL ARTERIES: Left dominant configuration. Both origins are clearly patent. There is no dissection, occlusion or flow-limiting stenosis to the skull base (V1-V3 segments). CTA HEAD FINDINGS POSTERIOR CIRCULATION: --Vertebral arteries: Normal V4 segments. --Inferior cerebellar arteries: Normal. --Basilar artery: Normal. --Superior cerebellar arteries: Normal. --Posterior cerebral arteries (PCA): Normal. ANTERIOR CIRCULATION: --Intracranial internal carotid arteries: Normal. --Anterior cerebral arteries  (ACA): Normal. Both A1 segments are present. Patent anterior communicating artery (a-comm). --Middle cerebral arteries (MCA): There is a distal right MCA M1 segment occlusion. Left MCA is normal. VENOUS SINUSES: As permitted by contrast timing, patent. ANATOMIC VARIANTS: Fetal origin of the right posterior cerebral artery. There is moderate collateral flow in the right MCA territory. Review of the MIP images confirms the above findings. IMPRESSION: 1. Distal right MCA M1 segment occlusion with moderate collateral flow in the right MCA territory. 2. No other intracranial arterial occlusion or high-grade stenosis. 3. Normal cervical carotid and vertebral arteries. Critical Value/emergent results were called by telephone at the time of interpretation on 03/23/2020 at 11:13 pm to provider SSamara Snide, who verbally acknowledged these results. Electronically Signed   By: KUlyses JarredM.D.   On: 03/23/2020 23:17   [place]  Result Date:  03/24/2020 CLINICAL DATA:  Acute kidney injury.  Elevated BUN and creatinine. EXAM: RENAL / URINARY TRACT ULTRASOUND COMPLETE COMPARISON:  None. FINDINGS: Right Kidney: Renal measurements: 9.7 x 4.7 x 4.2 cm. = volume: 97 mL. Normal echogenicity. Upper pole exophytic cyst measures 3.5 x 3.2 x 3.6 cm. No hydronephrosis Left Kidney: Renal measurements: 9.7 x 5.5 x 4.8 cm = volume: 132.8 mL. Normal echogenicity. No mass or hydronephrosis. Inferior pole cyst measures 1.8 x 1.7 x 1.6 cm. Bladder: Collapsed urinary bladder is not visualized. Other: None. IMPRESSION: 1. No acute abnormality. 2. Asymmetric right atrophy. 3. Bilateral kidney cysts. Electronically Signed   By: Kerby Moors M.D.   On: 03/24/2020 22:54   DG CHEST PORT 1 VIEW  Result Date: 03/25/2020 CLINICAL DATA:  Fever and vomiting.  Concern for aspiration. EXAM: PORTABLE CHEST 1 VIEW COMPARISON:  Chest x-ray from yesterday. FINDINGS: The heart size and mediastinal contours are within normal limits. Normal pulmonary  vascularity. Persistent low lung volumes and chronic elevation of the right hemidiaphragm with unchanged mild right basilar atelectasis. No focal consolidation, pleural effusion, or pneumothorax. No acute osseous abnormality. IMPRESSION: 1. Persistent low lung volumes and mild right basilar atelectasis. No active disease. Electronically Signed   By: Titus Dubin M.D.   On: 03/25/2020 10:05   DG CHEST PORT 1 VIEW  Result Date: 03/24/2020 CLINICAL DATA:  Stroke.  Aspiration. EXAM: PORTABLE CHEST 1 VIEW COMPARISON:  Earlier same day FINDINGS: Endotracheal tube is been removed. Heart size remains normal. No pulmonary edema. The patient has not taken a deep inspiration. There may be mild atelectasis at the right lung base but there is no dense consolidation or lobar collapse. No effusion. Surgical clips again noted at the right thoracic inlet. IMPRESSION: Extubated. Mild atelectasis at the right lung base. No dense consolidation or lobar collapse. Electronically Signed   By: Nelson Chimes M.D.   On: 03/24/2020 13:07   DG CHEST PORT 1 VIEW  Result Date: 03/24/2020 CLINICAL DATA:  Endotracheal tube placement. EXAM: PORTABLE CHEST 1 VIEW COMPARISON:  August 19, 2018 FINDINGS: The endotracheal tube terminates above the carina by approximately 3 cm. The heart size is unchanged from prior study. The lung volumes are low. Aortic calcifications are noted. There is no pneumothorax. Bibasilar atelectasis is noted. IMPRESSION: 1. Endotracheal tube as above. 2. Low lung volumes with bibasilar atelectasis. Electronically Signed   By: Constance Holster M.D.   On: 03/24/2020 03:54   ECHOCARDIOGRAM COMPLETE  Result Date: 03/24/2020    ECHOCARDIOGRAM REPORT   Patient Name:   ROXANNE PANEK Date of Exam: 03/24/2020 Medical Rec #:  [id number]        Height:       67.0 in Accession #:    [id number]       Weight:       169.8 lb Date of Birth:  [birth year]        BSA:          1.886 m Patient Age:    81 years         BP:            115/66 mmHg Patient Gender: F                HR:           85 bpm. Exam Location:  Inpatient Procedure: 2D Echo, Cardiac Doppler, Color Doppler and Intracardiac            Opacification Agent Indications:  Stroke 434.91 / I163.9  History:        Patient has no prior history of Echocardiogram examinations.                 Risk Factors:Hypertension and Diabetes. Hypothyrodism.  Sonographer:    Tiffany Dance Referring Phys: [id number] R AROOR  Sonographer Comments: Echo performed with patient supine and on artificial respirator. IMPRESSIONS  1. Left ventricular ejection fraction, by estimation, is 70 to 75%. The left ventricle has hyperdynamic function with intracavitary gradient up to 32 mmHg. The left ventricle has no regional wall motion abnormalities. There is mild left ventricular hypertrophy. Left ventricular diastolic parameters are indeterminate.  2. Right ventricular systolic function is normal. The right ventricular size is normal. Tricuspid regurgitation signal is inadequate for assessing PA pressure.  3. The aortic valve is tricuspid. Aortic valve regurgitation is not visualized. Mild aortic valve sclerosis is present, with no evidence of aortic valve stenosis.  4. Aortic dilatation noted. 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  5. The inferior vena cava is normal in size with greater than 50% respiratory variability, suggesting right atrial pressure of 3 mmHg.  6. Appears to be echodensity attached to left atrial side of posterior leaflet of mitral valve (best seen on image 3), but notably only seen on parasternal long axis images. Recomend further evaluation with with TEE to assess for cardioembolic source of  CVA. The mitral valve is degenerative. Trivial mitral valve regurgitation. FINDINGS  Left Ventricle: Hyperdynamic LV function with intracavitary gradient 32 mmHg. Left ventricular ejection fraction, by estimation, is 70 to 75%. The left ventricle has hyperdynamic  function. The left ventricle has no regional wall motion abnormalities. Definity contrast agent was given IV to delineate the left ventricular endocardial borders. The left ventricular internal cavity size was normal in size. There is mild left ventricular hypertrophy. Left ventricular diastolic parameters are indeterminate. Right Ventricle: The right ventricular size is normal. Right vetricular wall thickness was not assessed. Right ventricular systolic function is normal. Tricuspid regurgitation signal is inadequate for assessing PA pressure. Left Atrium: Left atrial size was normal in size. Right Atrium: Right atrial size was normal in size. Pericardium: There is no evidence of pericardial effusion. Mitral Valve: Possible echodensity attached to left atrial side of posterior leaflet of mitral valve (best seen on image 3), but notably only seen on parasternal long axis images. Recomend further evaluation with with TEE to rule out cardioembolic source  of CVA. The mitral valve is degenerative in appearance. Trivial mitral valve regurgitation. Tricuspid Valve: The tricuspid valve is normal in structure. Tricuspid valve regurgitation is not demonstrated. Aortic Valve: The aortic valve is tricuspid. Aortic valve regurgitation is not visualized. Mild aortic valve sclerosis is present, with no evidence of aortic valve stenosis. Pulmonic Valve: The pulmonic valve was not well visualized. Pulmonic valve regurgitation is not visualized. Aorta: Aortic dilatation noted. 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 Venous: The inferior vena cava is normal in size with greater than 50% respiratory variability, suggesting right atrial pressure of 3 mmHg. IAS/Shunts: The interatrial septum was not well visualized.  LEFT VENTRICLE PLAX 2D LVIDd:         4.00 cm LVIDs:         2.90 cm LV PW:         1.10 cm LV IVS:        1.00 cm LVOT diam:     1.80 cm LV SV:  86 LV SV Index:   46 LVOT Area:     2.54 cm   RIGHT VENTRICLE             IVC RV Basal diam:  2.10 cm     IVC diam: 1.60 cm RV S prime:     19.60 cm/s TAPSE (M-mode): 1.7 cm LEFT ATRIUM             Index       RIGHT ATRIUM          Index LA diam:        3.40 cm 1.80 cm/m  RA Area:     7.76 cm LA Vol (A2C):   42.5 ml 22.53 ml/m RA Volume:   12.30 ml 6.52 ml/m LA Vol (A4C):   29.3 ml 15.53 ml/m LA Biplane Vol: 37.4 ml 19.83 ml/m  AORTIC VALVE LVOT Vmax:   176.00 cm/s LVOT Vmean:  123.000 cm/s LVOT VTI:    0.338 m  AORTA Ao Root diam: 3.10 cm Ao Asc diam:  3.60 cm MITRAL VALVE MV Area (PHT): 2.60 cm     SHUNTS MV Decel Time: 292 msec     Systemic VTI:  0.34 m MV E velocity: 62.80 cm/s   Systemic Diam: 1.80 cm MV A velocity: 104.00 cm/s MV E/A ratio:  0.60 Oswaldo Milian MD Electronically signed by Oswaldo Milian MD Signature Date/Time: 03/24/2020/10:42:15 AM    Final    CT HEAD CODE STROKE WO CONTRAST  Result Date: 03/23/2020 CLINICAL DATA:  Code stroke.  Left arm weakness and facial droop. EXAM: CT HEAD WITHOUT CONTRAST TECHNIQUE: Contiguous axial images were obtained from the base of the skull through the vertex without intravenous contrast. COMPARISON:  None. FINDINGS: Brain: There is no mass, hemorrhage or extra-axial collection. The size and configuration of the ventricles and extra-axial CSF spaces are normal. There is hypoattenuation of the periventricular white matter, most commonly indicating chronic ischemic microangiopathy. Vascular: No abnormal hyperdensity of the major intracranial arteries or dural venous sinuses. No intracranial atherosclerosis. Skull: The visualized skull base, calvarium and extracranial soft tissues are normal. Sinuses/Orbits: No fluid levels or advanced mucosal thickening of the visualized paranasal sinuses. No mastoid or middle ear effusion. The orbits are normal. ASPECTS [facility] Stroke Program Early CT Score) - Ganglionic level infarction (caudate, lentiform nuclei, internal capsule, insula, M1-M3  cortex): 7 - Supraganglionic infarction (M4-M6 cortex): 3 Total score (0-10 with 10 being normal): 10 IMPRESSION: 1. No acute intracranial abnormality 2. ASPECTS is 10. These results were communicated to Dr. Karena Addison Aroor at 10:41 pm on 03/23/2020 by text page via the [facility] messaging system. Electronically Signed   By: Ulyses Jarred M.D.   On: 03/23/2020 22:41   VAS [place] LOWER EXTREMITY VENOUS (DVT)  Result Date: 03/24/2020  Lower Venous DVTStudy Indications: Stroke.  Limitations: Patient positioning. Comparison Study: 05/26/18 previous Performing Technologist: Abram Sander RVS  Examination Guidelines: A complete evaluation includes B-mode imaging, spectral Doppler, color Doppler, and power Doppler as needed of all accessible portions of each vessel. Bilateral testing is considered an integral part of a complete examination. Limited examinations for reoccurring indications may be performed as noted. The reflux portion of the exam is performed with the patient in reverse Trendelenburg.  +---------+---------------+---------+-----------+----------+--------------+ RIGHT    CompressibilityPhasicitySpontaneityPropertiesThrombus Aging +---------+---------------+---------+-----------+----------+--------------+ CFV      Full           Yes      Yes                                 +---------+---------------+---------+-----------+----------+--------------+  SFJ      Full                                                        +---------+---------------+---------+-----------+----------+--------------+ FV Prox  Full                                                        +---------+---------------+---------+-----------+----------+--------------+ FV Mid   Full                                                        +---------+---------------+---------+-----------+----------+--------------+ FV DistalFull                                                         +---------+---------------+---------+-----------+----------+--------------+ PFV      Full                                                        +---------+---------------+---------+-----------+----------+--------------+ POP      Full           Yes      Yes                                 +---------+---------------+---------+-----------+----------+--------------+ PTV      Full                                                        +---------+---------------+---------+-----------+----------+--------------+ PERO     Full                                                        +---------+---------------+---------+-----------+----------+--------------+   +---------+---------------+---------+-----------+----------+--------------+ LEFT     CompressibilityPhasicitySpontaneityPropertiesThrombus Aging +---------+---------------+---------+-----------+----------+--------------+ CFV      Full           Yes      Yes                                 +---------+---------------+---------+-----------+----------+--------------+ SFJ      Full                                                        +---------+---------------+---------+-----------+----------+--------------+  FV Prox  Full                                                        +---------+---------------+---------+-----------+----------+--------------+ FV Mid   Full                                                        +---------+---------------+---------+-----------+----------+--------------+ FV Distal               Yes      Yes                                 +---------+---------------+---------+-----------+----------+--------------+ PFV      Full                                                        +---------+---------------+---------+-----------+----------+--------------+ POP      Full           Yes      Yes                                  +---------+---------------+---------+-----------+----------+--------------+ PTV      Full                                                        +---------+---------------+---------+-----------+----------+--------------+ PERO                                                  Not visualized +---------+---------------+---------+-----------+----------+--------------+     Summary: BILATERAL: - No evidence of deep vein thrombosis seen in the lower extremities, bilaterally. -   *See table(s) above for measurements and observations. Electronically signed by Curt Jews MD on 03/24/2020 at 5:33:26 PM.    Final    Assessment and Plan:   1. MV vegetation: -Pt presented to [facility] on 03/23/20 with acute onset of left sided weakness and slurred speech. Code Stroke called by ED physician. HPI obtained from chart reviewed given patients acute status. She was last seen normal approximately 8:50pm on day of presentation when she had sudden acute onset of slurred speech and left sided weakness.  -CT was obtained which showed no acute findings>>TPA was administered after consent. Unfortunately her NIHSS worsened. At that time CT angiogram confirmed right distal M1 occlusion. She was then taken for mechanical thrombectomy per IR. Pt was intubated for procedure and has failed to meet extubation criteria and remains intubated at this time.  -Plan per neurology was to obtain MRI/MRA which is pending. LE doppler studies obtained which were negative. -Echocardiogram was performed 03/24/20  which showed echodensity attached to left atrial side of posterior leaflet of mitral valve (best seen on image 3), but notably only seen on parasternal long axis images. Recomend further evaluation with with TEE to assess for cardioembolic source of CVA. The mitral valve is degenerative with trivial mitral valve regurgitation.  -Recommend proceeding with TEE for further evaluation of echodense mass  -Consider ID consult for suspected  endocarditits  2. 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  -Management per neurology  3. Acute respiratory failure: -Intubated for IR procedure>>currenlty extubated and maintaining adequate saturations  -Management per PCCM/neurology  4. HTN: -Stable, 151/75>131/75>145/70 -PTA amlodipine, lisinopril, metoprolol  -Currently Cleviprex  5. HLD: -Triglycerides found to be markedly elevated, >500 -LDL, 63.8 with goal <70 -Plan to add statin once able to take PO   6. DM2: -HbA1c, 8.9  -SSI while inpatient status -PTA trulicity, glimepiride, metformin, ozempic       For questions or updates, please contact [facility] Please consult [email or web address] for contact info under Cardiology/STEMI.   Lyndel Safe NP-C [facility] Pager: [phone] 03/25/2020 1:40 PM   Patient seen and examined. Agree with assessment and plan.  Ms. Oaklyn Jakubek is a 74 year old female who has a history of diabetes mellitus, hypertension, hypothyroidism as well as hyperlipidemia.  She presented to [facility] on March 23, 2020 after developing acute onset of left-sided weakness and slurred speech.  TPA was administered and CT initially did not show acute findings but subsequently on repeat confirmed distal right MCA M1 segment occlusion with moderate collateral flow in the right MCA territory.  Cerebral angio showed TICI3 revascularization  M1 occlusion.  Subsequent imaging showed progressive right MCA territory infarct with basal ganglia sparing.  She developed respiratory distress, fever, UTI and sepsis and is now on antibiotic therapy with meropenem following initial therapy with [place] cefepime.  To assess for embolic etiology, 2D  echo Doppler study has demonstrated dynamic LV function with EF 70 to 75%.  There is evidence for an echodensity attached to the left atrial side of the posterior leaflet of the mitral valve.  There was trivial mitral regurgitation.  Patient required intubation for her IR intervention but continued to be in it until yesterday when she was extubated and is now utilizing a nasal trumpet.  There was no history of a prodrome of significant fevers prior to presentation.  Presently telemetry reveals sinus rhythm in the 80s.  Blood pressure is stable.  Presently she is not responsive.  I did not appreciate any carotid bruits.  There was no wheezing.  Rhythm was regular without significant murmur.  There was no gallop.  Abdomen was soft nontender.  She was mildly edematous in her extremities.  He had been on amlodipine, lisinopril and metoprolol  which are currently on hold and she was receiving Cleviprex.  We will schedule patient for TEE for further assessment of her mitral valve echodense structure on the atrial side of her posterior atrial leaflet.  No definitive stigmata of endocarditis on exam.  Once she is able to take oral medicines, will change pravastatin to rosuvastatin 40 mg or atorvastatin 80 mg and consider fenofibrate or Vascepa in light of marked hypertriglyceridemia.  Follow-up amylase to make certain there is no development  of pancreatitis with her marked triglyceride elevation.  Will need more optimal diabetic management.  Consider ID evaluation.   Troy Sine, MD, [facility] 03/25/2020 3:11 PM

## 2020-03-25 NOTE — Progress Notes (Signed)
NAME:  LAURENE MELENDREZ, MRN:  [id number], DOB:  09-Mar-1946, LOS: 1 ADMISSION DATE:  03/23/2020, CONSULTATION DATE: 03/24/2020 REFERRING MD:  Aroor, CHIEF COMPLAINT: Right MCA M1 stroke  Brief History   74 year old female presented as code stroke, CT head negative, CTA showed right MCA M1 segment occlusion.  Status post TPA and stentriever on 03/24/2020. Course complicated by sepsis, thought to be urosepsis.   Past Medical History  Hypertension, diabetes, hypothyroidism, hypertensive retinopathy  Significant Hospital Events   tPA movement at 2252 on 03/23/2020 S/P RT CCA angiogram followed by complete revascularization of occluded RT MCA M 1 seg with x 1 pass with 41mm x 37 mm embotrap retriver and x 1 pass with solitaire78mm x 40 mm X retriver and penumbra aspiration  Achieving a TICI 3 revascularization.  6/14 - Episode of dark emesis, lethargic. Found to have UTI with lactate to 4.1. Started on Abx.  Consults:  Neurology Interventional radiology  Procedures:  S/P RT CCA angiogram followed by complete revascularization of occluded RT MCA M 1 seg with x 1 pass with 20mm x 37 mm embotrap retriver and x 1 pass with solitaire80mm x 40 mm X retriver and penumbra aspiration  Achieving a TICI 3 revascularization.  Significant Diagnostic Tests:  CT head 6/13: No acute intracranial abnormality  CT angio head and neck:  1. Distal right MCA M1 segment occlusion with moderate collateral flow in the right MCA territory. 2. No other intracranial arterial occlusion or high-grade stenosis. 3. Normal cervical carotid and vertebral arteries  CT head 6/14 > neg.  X-ray Abdomen 6/14 > moderate right and mild left hydroureteronephrosis. No obstruction  CT head 6/14 > progressive right MCA infarct. No hemorrhage. Hyperdense R M2 segment sylvian fissure compatible with thrombosis  US Renal 6/14 > Asymmetric right atrophy, bilateral kidney cysts  Echo 6/14 > LVEF 70-75%. Echodensity to L atrial side of  MV c/f vegetation. Recommend TEE  Micro Data:  MRSA 6/14 > neg Urine cx 6/14 >  Blood cx 6/14 >   Antimicrobials:  Cefepime 6/14 > 6/15 Meropenem 6/15 >   Interim history/subjective:  Overnight, patient developed progressive lethargy. She also developed poor cough and secretion mobilization. She was taken for MRI but developed increased O2 requirement (2L-->4L). Also became bradycardic to low 40s. MRI was discontinued. Pt developed several other episodes of tachycardia to 120s and received metoprolol.  This morning, she remains somnolent. Not responding to commands.  Objective   Blood pressure 137/68, pulse (!) 112, temperature (!) 101.7 F (38.7 C), resp. rate 20, weight 77 kg, SpO2 91 %.    Vent Mode: CPAP;PSV PEEP:  [5 cmH20] 5 cmH20 Pressure Support:  [5 cmH20] 5 cmH20   Intake/Output Summary (Last 24 hours) at 03/25/2020 0831 Last data filed at 03/25/2020 0800 Gross per 24 hour  Intake 3412.14 ml  Output 1825 ml  Net 1587.14 ml   Filed Weights   03/23/20 2233  Weight: 77 kg    Examination: General: Adult female, resting in bed, in NAD. Neuro: Sedated but opens eyes to verbal cues. On re-exam, moving extremities spontaneously, opening eyes and awake, more alert than yesterday HEENT: [place]/AT. Sclerae anicteric. ETT in place. Cardiovascular: RRR, no M/R/G.  Lungs: Respirations even and unlabored.  CTA bilaterally, No W/R/R. Abdomen: BS x 4, soft, NT/ND.  Musculoskeletal: No gross deformities, no edema.  Skin: Intact, warm, no rashes.   Assessment & Plan:  Elevated lactate, fever New onset sepsis found on 6/14. Lactate 4.1, now 2.6. WBC  downtrending at 14.3 (<--18.6). Last ABG normal. Tmax 102.8. Concern for urosepsis vs. Aspiration pneumonia secondary to vomiting event. - Meropenem 1g IV q12h - Trend lactate - IV tylenol. Temp goal of 99.5, trend temp curve - Will switch D5LR to NS  Need for mechanical ventilation post procedure: S/p extubation on 6/14. Currently  on 4L by [place]. Mental status is waxing and waning. - Monitor closely for intubation risk. - Bronchial hygiene.  Right MCA stroke  Status post TPA, stentriever by IR.  - Stroke workup per neuro. - Cleviprex - BP control - amlodipine, lisinopril, metoprolol  AKI Cr 1.7, though downtrending from 1.79. Husband reports that pt was scheduled to see a nephrologist. No hydronephrosis on renal ultrasound. - I/Os - Trend renal indices  Mitral Valve vegetation, found on echo 6/14 - C/s cards for TEE to check for cardioembolic source  Diabetes. HbA1c 8.9 - Continue SSI, resistant scale. - Hold home metformin, glimepiride, dulaglutide.  Best practice:  Diet: NPO for now Pain/Anxiety/Delirium protocol (if indicated): Fentanyl prn, propofol VAP protocol (if indicated): ordered DVT prophylaxis: per primary team GI prophylaxis: Protonix Glucose control: Insulin SS Mobility: PT Code Status: Full Family Communication: Per primary Disposition: remains in Neuro ICU  Critical care time: 30 min   Acquanetta Sit, MS4

## 2020-03-25 NOTE — Procedures (Signed)
Patient [name]  MRN: [id number]  Epilepsy Attending: Charlsie Quest  Referring Physician/Provider: Dr. Marvel Plan Date: 03/25/2020 Duration: 25.29 minutes  Patient history: 74 year old female presenting with slurred speech, left-sided weakness and ataxia.  MRI brain showed right MCA infarct.  EEG evaluate for seizures.  Level of alertness: Awake, asleep   AEDs during EEG study: None  Technical aspects: This EEG study was done with scalp electrodes positioned according to the 10-20 International system of electrode placement. Electrical activity was acquired at a sampling rate of 500Hz  and reviewed with a high frequency filter of 70Hz  and a low frequency filter of 1Hz . EEG data were recorded continuously and digitally stored.   Description: During awake state, no clear posterior dominant rhythm was seen.  Sleep was characterized by vertex, maximal frontocentral region.  EEG showed continuous generalized and lateralized right hemisphere 3 to 6 Hz theta-delta slowing.  Sharp waves were also seen in right frontal region which appeared rhythmic without any clear evolution when patient was stimulated at the end of the study.  Hyperventilation and photic stimulation were not performed.     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  The sharp waves appeared rhythmic when patient was stimulated without any clear evolution and is more likely related to arousal.  No definite seizures were seen during the study. 

## 2020-03-25 NOTE — Progress Notes (Signed)
Verbal order from Dr. Corliss Skains for SBP <160.

## 2020-03-25 NOTE — Progress Notes (Signed)
Pharmacy Antibiotic Note  Meredith Shaw is a 74 y.o. female admitted on 03/23/2020 with UTI.  Pharmacy has been consulted for meropenem dosing.  Plan: Meropenem 1gm IV q12 hours F/u renal function, cultures and clinical course  Weight: 77 kg (169 lb 12.1 oz)  Temp (24hrs), Avg:100.6 F (38.1 C), Min:98.4 F (36.9 C), Max:102.8 F (39.3 C)  Recent Labs  Lab 03/23/20 2227 03/23/20 2229 03/24/20 0450 03/24/20 1527 03/24/20 1929 03/25/20 0055  WBC 9.4  --  12.2* 18.6*  --  14.3*  CREATININE 1.70* 1.60* 1.54* 1.79*  --  1.70*  LATICACIDVEN  --   --   --  4.1* 3.2*  --     Estimated Creatinine Clearance: 31.5 mL/min (A) (by C-G formula based on SCr of 1.7 mg/dL (H)).    Allergies  Allergen Reactions  . Semaglutide Nausea And Vomiting    PO Rybelsus  . Farxiga [Dapagliflozin]     Yeast infections  . Penicillins Itching and Rash    Has tolerated Cefazlin and Ceftriaxone in past       Thank you for allowing pharmacy to be a part of this patient's care.  Talbert Cage Poteet 03/25/2020 1:41 AM

## 2020-03-25 NOTE — Progress Notes (Signed)
Initial Nutrition Assessment  RD working remotely.  DOCUMENTATION CODES:   Not applicable  INTERVENTION:  - once enteral access obtained: Vital 1.5 @ 55 ml/hr with 30 ml prostat BID. - this regimen will provide 2180 kcal, 119 grams protein, and 1008 ml free water.  - free water flush, if desired, to be per Neurology.    NUTRITION DIAGNOSIS:   Increased nutrient needs related to acute illness as evidenced by estimated needs.  GOAL:   Patient will meet greater than or equal to 90% of their needs  MONITOR:   Labs, Weight trends  REASON FOR ASSESSMENT:   Consult Enteral/tube feeding initiation and management  ASSESSMENT:   74 y.o. female with medical history of DM, HTN, and hypothyroidism. She presented to the ED due to sudden onset of slurred speech and L-sided weakness around 8PM on 6/14. Code Stroke was initiated in the ED. CT showed no acute findings. TPA was administered. CT angiogram showed R distal M1 occlusion and patient went for mechanical thrombectomy.  RD working remotely. No phones in patients' rooms on 4N. Flow sheet documentation indicates that patient is disoriented x4, has slurred speech, and is unable to follow commands.  RN currently off the floor, but able to talk with another RN on the phone who is aware of patient. She reports that NGT attempted several times yesterday but that all attempts were unsuccessful d/t meeting resistance in the nares. Plan yesterday was for NGT to LIS d/t episode of vomiting   RN reports that small bore NGT now desired for TF. Discussed the option of consulting Diagnostic Radiology to place tube under fluoro today vs wait for Cortrak service 6/16. RN plans to pass message on to patient's RN.   Per chart review, weight on 6/13 was 170 lb and weight has been stable since 08/09/2018.   POD #1 emergent mechanical thrombectomy of R MCA M1 occlusion; recascularization. Patient was extubated yesterday at 1045.   Labs reviewed; CBGs:  217, 201, 171 mg/d,, BUN: 33 mg/dl, creatinine: 1.7 mg/dl, Ca: 8.5 mg/dl, GFR: 29 ml/min. Medications reviewed; 100 mg colace BID, sliding scale novolog, 40 mg IV protonix BID, 17 g miralax/day. IVF; NS @ 100 ml/hr. Drip; cleveprex off as of 1000 today   NUTRITION - FOCUSED PHYSICAL EXAM:  unable to complete at this time.   Diet Order:   Diet Order            Diet NPO time specified  Diet effective now                 EDUCATION NEEDS:   No education needs have been identified at this time  Skin:  Skin Assessment: Skin Integrity Issues: Skin Integrity Issues:: Incisions Incisions: R groin (6/14)  Last BM:  PTA/unknown  Height:   Ht Readings from Last 1 Encounters:  02/21/20 5\' 7"  (1.702 m)    Weight:   Wt Readings from Last 1 Encounters:  03/23/20 77 kg     Estimated Nutritional Needs:  Kcal:  2155-2310 kcal Protein:  115-125 grams Fluid:  >/= 2.2 L/day     06-11-1976, MS, RD, LDN, CNSC Inpatient Clinical Dietitian RD pager # available in [facility]  After hours/weekend pager # available in [facility]

## 2020-03-25 NOTE — Progress Notes (Signed)
OT Cancellation Note  Patient Details [name] MRN: [id number] DOB: [birth year]   Cancelled Treatment:    Reason Eval/Treat Not Completed: Active bedrest order;Patient not medically ready .  Pt remains on bedrest, and per RN, not following commands and having a difficult time managing secretions.  Will reattempt when medically stable.   Eber Jones., OTR/L Acute Rehabilitation Services Pager [phone] Office [phone]   Jeani Hawking M 03/25/2020, 10:11 AM

## 2020-03-25 NOTE — Progress Notes (Signed)
EEG Completed; Results Pending  

## 2020-03-25 NOTE — Progress Notes (Addendum)
NAME:  ELSPETH BLUCHER, MRN:  [id number], DOB:  1945-10-19, LOS: 1 ADMISSION DATE:  03/23/2020, CONSULTATION DATE: 03/24/2020 REFERRING MD:  [place]or, CHIEF COMPLAINT: Right MCA M1 stroke  Brief History   74 year old female presented as code stroke, CT head negative, CTA showed right MCA M1 segment occlusion.  Status post TPA and stentriever on 03/24/2020.  History of present illness   74 year old female with past medical history of diabetes, hypertension who was doing well all day on Sunday.  Husband noted at 8:45 PM that patient has slurry speech.  Patient called his son who arrived in 15 minutes and noticed the facial droop, slurred speech as well as left upper extremity weakness.  The brought her to emergency department via POV.  History was taken from son and husband at the bedside who reported patient did not have any other symptoms throughout the day including fever, chills, nausea, vomiting, headache.  She also does not have any prior history of CVAs, CAD.  In the ED her vital signs were unremarkable, lab work-up showed blood glucose 316 with anion gap of 13, creatinine 1.70, normal LFTs and CBC.  INR was 1.0.  Screening SARS-CoV-2 negative.  CT head was negative CTA showed distal right MCA M1 segment occlusion with moderate collateral flow.  Patient received TPA and afterwards thrombectomy via stentriever by IR.  Patient was seen and examined in the neuro ICU after the procedure, she remains intubated and on propofol 50.  She appears to be saturating well and her FiO2 was decreased to 50%.  ABG shows 7.3 4/38/157 on FiO2 50%.  No history of tobacco use or any lung parenchymal disease, never used any inhalers.  Past Medical History  Hypertension, diabetes, hypothyroidism, hypertensive retinopathy  Significant Hospital Events   tPA movement at 2252 on 03/23/2020 S/P RT CCA angiogram followed by complete revascularization of occluded RT MCA M 1 seg with x 1 pass with 54mm x 37 mm embotrap  retriver and x 1 pass with solitaire51mm x 40 mm X retriver and penumbra aspiration  Achieving a TICI 3 revascularization.  Consults:  Neurology Interventional radiology  Procedures:  S/P RT CCA angiogram followed by complete revascularization of occluded RT MCA M 1 seg with x 1 pass with 43mm x 37 mm embotrap retriver and x 1 pass with solitaire38mm x 40 mm X retriver and penumbra aspiration  Achieving a TICI 3 revascularization.  Significant Diagnostic Tests:  CT head 6/13: No acute intracranial abnormality CT angio head and neck:  1. Distal right MCA M1 segment occlusion with moderate collateral flow in the right MCA territory. 2. No other intracranial arterial occlusion or high-grade stenosis. 3. Normal cervical carotid and vertebral arteries CT head 6/14 > neg.  03/24/2020 Echo Left ventricular ejection fraction, by estimation, is 70 to 75%. The left ventricle has hyperdynamic function with intracavitary gradient up to 32 mmHg. The left ventricle has no regional wall motion abnormalities. There is mild left ventricular hypertrophy. Left ventricular diastolic parameters are indeterminate. Right ventricular systolic function is normal. The right ventricular size is normal.  Tricuspid regurgitation signal is inadequate for assessing PA pressure. The aortic valve is tricuspid. Aortic valve regurgitation is not visualized. Mild aortic valvesclerosis is present, with no evidence of aortic valve stenosis.  Aortic dilatation noted.  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 The inferior vena cava is normal in size with greater than 50% respiratory variability, suggesting right atrial pressure of 3 mmHg. Appears to be echodensity  attached to left atrial side of posterior leaflet of mitral valve (best seen on image 3), but notably only seen on parasternal long axis images. Recomend further evaluation with with TEE to assess for cardioembolic source of CVA.  The mitral  valve is degenerative. Trivial mitral valve regurgitation.  6/14 Renal US No acute abnormality. Asymmetric right atrophy. Bilateral kidney cysts.  Micro Data:  6/14 Blood Cx>> 6/14 Urine Cx>>  Antimicrobials:  Merrem 6/14>>  Interim history/subjective:  Extubated 6/14, but vomited, developed fever of 102, tachy and lactate of 4 evening of 6/14 IVF, ABX started  and CX done 6/14 6/15.>>WBC of 14.3, AKI ( creatinine of 1.70 ( down from 1.78) Remains febrile, WBC 14.3, No CXR, weak cough requiring prn NTS On 4 L [place] with sats ranging from 90-95 while I was in the room Protecting airway for now, low threshold to re-intubate if needed   Objective   Blood pressure 137/68, pulse (!) 112, temperature (!) 101.7 F (38.7 C), resp. rate 20, weight 77 kg, SpO2 91 %.    Vent Mode: CPAP;PSV PEEP:  [5 cmH20] 5 cmH20 Pressure Support:  [5 cmH20] 5 cmH20   Intake/Output Summary (Last 24 hours) at 03/25/2020 0837 Last data filed at 03/25/2020 0800 Gross per 24 hour  Intake 3412.14 ml  Output 1825 ml  Net 1587.14 ml   Filed Weights   03/23/20 2233  Weight: 77 kg    Examination: General: Adult female, resting in bed, in NAD. Neuro: Eyes closed, holding daughters hand, follows few  commands on R, flaccid on L, right gaze preference HEENT: /AT. Sclerae anicteric. No LAD Cardiovascular: S1, S2, RRR, no M/R/G. Tachy per tele Lungs: Bilateral chest excursion,Respirations even and unlabored. Few rhonchi noted , No W/R/R. Abdomen: BS x 4, soft, NT/ND.  Musculoskeletal: No obvious  deformities, no edema.  Skin: Intact, warm, no rashes, or lesions.PIV x 3   Assessment & Plan:  74 year old female with past medical history of hypertension, diabetes, hypertensive retinopathy was brought to emergency department by POV 10 PM after she was noted to have slurring of speech, left facial droop and left upper extremity weakness the time of onset at 8:45 PM.  Status post TPA.  CT head and neck showed  right MCA M1 occlusion and patient underwent stentriever by IR.   FUO of 102 after vomiting event post extubation 6/14 with ? aspiration Lactate bump to 4 ( 6/14) Plan Continue Merrem Follow Micro Trend lactate  D5LR at 100 until lactate normalizes Trend WBC and fever curve Re-culture as is clinically appropriate Trend CXR ( ? Aspiration)  Need for mechanical ventilation post procedure: Extubated 6/14 to 4 L  Weak Cough requiring NTS suctioning Plan Continue NTS prn Titrate oxygen for sats > 92 Low threshold for intubation if no longer protecting airway CXR Now Trend CXR prn  AKI Creatinine down trending but still 1.7 Per daughter was having renal issues  Had appointment with nephrology scheduled for  Today Renal US with No Acute issues/ obstruction + for bilateral kidney cysts Plan Trend BMET Follow I&O Avoid nephrotoxic medications Maintain renal perfusion   Right MCA stroke - Status post TPA, stentriever by IR. - Stroke workup per neuro.  ? Vegetation mitral valve per Echo 6/14 Plan Cards consult for  TEE to eval for possible source of stroke  Diabetes. - Continue SSI, change to resistant scale. - Hold home metformin, glimepiride, dulaglutide.  Check HbA1C. Hold metformin and start ISS for now.  Best practice:  Diet:  NPO for now Pain/Anxiety/Delirium protocol (if indicated): Fentanyl prn, propofol VAP protocol (if indicated): ordered DVT prophylaxis: per primary team GI prophylaxis: Protonix Glucose control: Insulin SS Mobility: with PT once extubated Code Status: Full Family Communication: Per primary Disposition: remains in Neuro ICU  Critical care time: 32 min   Magdalen Spatz, MSN, AGACNP-BC [place] for personal pager PCCM on call pager [phone] 03/25/2020, 8:37 AM

## 2020-03-25 NOTE — Progress Notes (Signed)
STROKE TEAM PROGRESS NOTE   INTERVAL HISTORY Daughter at bedside. Pt has extubated yesterday but has difficulty controlling secretions, now on nasal trumpet. Still has gurgling sound in the throat. Daughter is POA, OK with re-intubation if needed. She is on meropenem for UTI, fever and sepsis. Not able to do MRI yet, but CT repeat showed large MCA infarct with reoccluded MCA.    Vitals:   03/25/20 0700 03/25/20 0730 03/25/20 0800 03/25/20 0830  BP: 139/69 (S) (!) 144/68 137/68 133/64  Pulse: (!) 126 (!) 122 (!) 112 99  Resp: (!) 24 (!) Temp:   (!) 101.7 F (38.7 C)   TempSrc:      SpO2: 93% 92% 91% 90%  Weight:       CBC:  Recent Labs  Lab 03/23/20 2227 03/23/20 2229 03/24/20 0450 03/24/20 0450 03/24/20 1527 03/24/20 1527 03/24/20 1859 03/25/20 0055  WBC 9.4   < > 12.2*   < > 18.6*  --   --  14.3*  NEUTROABS 6.5  --  10.0*  --   --   --   --   --   HGB 13.4   < > 12.1   < > 12.0   < > 10.5* 11.1*  HCT 40.8   < > 34.8*   < > 35.3*   < > 31.0* 32.8*  MCV 89.7   < > 87.9   < > 87.6  --   --  87.7  PLT 222   < > 224   < > 216  --   --  187   < > = values in this interval not displayed.   Basic Metabolic Panel:  Recent Labs  Lab 03/24/20 1527 03/24/20 1527 03/24/20 1859 03/25/20 0055  NA 138   < > 143 139  K 4.0   < > 3.9 3.9  CL 108  --   --  109  CO2 17*  --   --  19*  GLUCOSE 298*  --   --  248*  BUN 41*  --   --  33*  CREATININE 1.79*  --   --  1.70*  CALCIUM 8.6*  --   --  8.5*   < > = values in this interval not displayed.   Lipid Panel:     Component Value Date/Time   CHOL 156 03/24/2020 0450   CHOL 163 11/15/2019 0859   TRIG 618 (H) 03/24/2020 0450   TRIG 605 (H) 03/24/2020 0450   TRIG 456 (H) 07/03/2013 1504   HDL 27 (L) 03/24/2020 0450   HDL 33 (L) 11/15/2019 0859   HDL 36 (L) 07/03/2013 1504   CHOLHDL 5.8 03/24/2020 0450   VLDL UNABLE TO CALCULATE IF TRIGLYCERIDE OVER 400 mg/dL 75/64/3329 5188   LDLCALC UNABLE TO CALCULATE IF  TRIGLYCERIDE OVER 400 mg/dL 41/66/0630 1601   LDLCALC 83 11/15/2019 0859   LDLCALC NOTES: 07/03/2013 1504   HgbA1c:  Lab Results  Component Value Date   HGBA1C 8.9 (H) 03/24/2020   Urine Drug Screen: No results found for: LABOPIA, COCAINSCRNUR, LABBENZ, AMPHETMU, THCU, LABBARB  Alcohol Level No results found for: ETH  IMAGING past 24 hours DG Abd 1 View  Result Date: 03/24/2020 CLINICAL DATA:  Vomiting. EXAM: ABDOMEN - 1 VIEW COMPARISON:  Abdominal x-ray dated September 25, 2015. FINDINGS: The bowel gas pattern is normal. No radio-opaque calculi or other significant radiographic abnormality are seen. Excreted contrast in the renal collecting systems, ureters, and bladder. Moderate  right and mild left hydroureteronephrosis. No acute osseous abnormality. IMPRESSION: 1. No obstruction. 2. Moderate right and mild left hydroureteronephrosis. Electronically Signed   By: Obie Dredge M.D.   On: 03/24/2020 16:42   CT HEAD WO CONTRAST  Result Date: 03/24/2020 CLINICAL DATA:  Stroke follow-up. Encephalopathy. Right MCA thrombectomy 03/24/2020 EXAM: CT HEAD WITHOUT CONTRAST TECHNIQUE: Contiguous axial images were obtained from the base of the skull through the vertex without intravenous contrast. COMPARISON:  CT head 03/24/2020 at 1201 hours. CT head 03/24/2020 at 0224 hours. FINDINGS: Brain: Progressive edema right MCA territory consistent with acute infarction. This involves the insula, superior temporal lobe, and frontal parietal cortex. No associated hemorrhage or midline shift. Low-density in the anterior frontal lobes bilaterally is symmetric and is felt to be due to artifact from motion and beam hardening. Low-density in the left frontal white matter compatible with chronic ischemia. Ventricle size normal. Vascular: Hyperdensity in the left M2 branch in the sylvian fissure compatible with thrombosis. Negative for hyperdense M1 segment. Skull: No focal skeletal lesion. Sinuses/Orbits: Paranasal  sinuses clear. Bilateral cataract extraction. Other: None IMPRESSION: Progressive low-density in the right MCA territory compatible with acute infarct. Sparing of the basal ganglia. No associated hemorrhage. Hyperdense right M2 segment sylvian fissure compatible with thrombosis. Electronically Signed   By: Marlan Palau M.D.   On: 03/24/2020 21:59   CT HEAD WO CONTRAST  Result Date: 03/24/2020 CLINICAL DATA:  Stroke. Left arm weakness and facial droop. Right M1 occlusion yesterday with subsequent vascular intervention. EXAM: CT HEAD WITHOUT CONTRAST TECHNIQUE: Contiguous axial images were obtained from the base of the skull through the vertex without intravenous contrast. COMPARISON:  Head CT earlier same day FINDINGS: Brain: Developing low-density with loss of gray-white differentiation throughout the right middle cerebral artery territory affecting the insula, frontal operculum and right frontoparietal cortical and subcortical brain. Sparing of the basal ganglia. No evidence of hemorrhage or midline shift. Chronic small-vessel ischemic changes elsewhere affect the cerebral hemispheric white matter. Old right cerebellar infarction. No hydrocephalus. No extra-axial collection. Vascular: There is atherosclerotic calcification of the major vessels at the base of the brain. Skull: Negative Sinuses/Orbits: Clear/normal Other: None IMPRESSION: Changes of acute infarction now evident by CT affecting the right middle cerebral artery territory with sparing of the basal ganglia. No hemorrhage. No midline shift. Electronically Signed   By: Paulina Fusi M.D.   On: 03/24/2020 12:32   US RENAL  Result Date: 03/24/2020 CLINICAL DATA:  Acute kidney injury.  Elevated BUN and creatinine. EXAM: RENAL / URINARY TRACT ULTRASOUND COMPLETE COMPARISON:  None. FINDINGS: Right Kidney: Renal measurements: 9.7 x 4.7 x 4.2 cm. = volume: 97 mL. Normal echogenicity. Upper pole exophytic cyst measures 3.5 x 3.2 x 3.6 cm. No  hydronephrosis Left Kidney: Renal measurements: 9.7 x 5.5 x 4.8 cm = volume: 132.8 mL. Normal echogenicity. No mass or hydronephrosis. Inferior pole cyst measures 1.8 x 1.7 x 1.6 cm. Bladder: Collapsed urinary bladder is not visualized. Other: None. IMPRESSION: 1. No acute abnormality. 2. Asymmetric right atrophy. 3. Bilateral kidney cysts. Electronically Signed   By: Signa Kell M.D.   On: 03/24/2020 22:54   DG CHEST PORT 1 VIEW  Result Date: 03/24/2020 CLINICAL DATA:  Stroke.  Aspiration. EXAM: PORTABLE CHEST 1 VIEW COMPARISON:  Earlier same day FINDINGS: Endotracheal tube is been removed. Heart size remains normal. No pulmonary edema. The patient has not taken a deep inspiration. There may be mild atelectasis at the right lung base but there is no  dense consolidation or lobar collapse. No effusion. Surgical clips again noted at the right thoracic inlet. IMPRESSION: Extubated. Mild atelectasis at the right lung base. No dense consolidation or lobar collapse. Electronically Signed   By: Paulina Fusi M.D.   On: 03/24/2020 13:07   VAS [place] LOWER EXTREMITY VENOUS (DVT)  Result Date: 03/24/2020  Lower Venous DVTStudy Indications: Stroke.  Limitations: Patient positioning. Comparison Study: 05/26/18 previous Performing Technologist: Blanch Media RVS  Examination Guidelines: A complete evaluation includes B-mode imaging, spectral Doppler, color Doppler, and power Doppler as needed of all accessible portions of each vessel. Bilateral testing is considered an integral part of a complete examination. Limited examinations for reoccurring indications may be performed as noted. The reflux portion of the exam is performed with the patient in reverse Trendelenburg.  +---------+---------------+---------+-----------+----------+--------------+ RIGHT    CompressibilityPhasicitySpontaneityPropertiesThrombus Aging +---------+---------------+---------+-----------+----------+--------------+ CFV      Full           Yes       Yes                                 +---------+---------------+---------+-----------+----------+--------------+ SFJ      Full                                                        +---------+---------------+---------+-----------+----------+--------------+ FV Prox  Full                                                        +---------+---------------+---------+-----------+----------+--------------+ FV Mid   Full                                                        +---------+---------------+---------+-----------+----------+--------------+ FV DistalFull                                                        +---------+---------------+---------+-----------+----------+--------------+ PFV      Full                                                        +---------+---------------+---------+-----------+----------+--------------+ POP      Full           Yes      Yes                                 +---------+---------------+---------+-----------+----------+--------------+ PTV      Full                                                        +---------+---------------+---------+-----------+----------+--------------+  PERO     Full                                                        +---------+---------------+---------+-----------+----------+--------------+   +---------+---------------+---------+-----------+----------+--------------+ LEFT     CompressibilityPhasicitySpontaneityPropertiesThrombus Aging +---------+---------------+---------+-----------+----------+--------------+ CFV      Full           Yes      Yes                                 +---------+---------------+---------+-----------+----------+--------------+ SFJ      Full                                                        +---------+---------------+---------+-----------+----------+--------------+ FV Prox  Full                                                         +---------+---------------+---------+-----------+----------+--------------+ FV Mid   Full                                                        +---------+---------------+---------+-----------+----------+--------------+ FV Distal               Yes      Yes                                 +---------+---------------+---------+-----------+----------+--------------+ PFV      Full                                                        +---------+---------------+---------+-----------+----------+--------------+ POP      Full           Yes      Yes                                 +---------+---------------+---------+-----------+----------+--------------+ PTV      Full                                                        +---------+---------------+---------+-----------+----------+--------------+ PERO  Not visualized +---------+---------------+---------+-----------+----------+--------------+     Summary: BILATERAL: - No evidence of deep vein thrombosis seen in the lower extremities, bilaterally. -   *See table(s) above for measurements and observations. Electronically signed by Gretta Beganodd Early MD on 03/24/2020 at 5:33:26 PM.    Final     PHYSICAL EXAM   Temp:  [98.7 F (37.1 C)-102.8 F (39.3 C)] 101.7 F (38.7 C) (06/15 0800) Pulse Rate:  [33-129] 99 (06/15 0830) Resp:  [16-27] 18 (06/15 0830) BP: (110-150)/(45-89) 133/64 (06/15 0830) SpO2:  [90 %-100 %] 90 % (06/15 0830) Arterial Line BP: (142)/(58) 142/58 (06/14 1100)  General - Well nourished, well developed, on nasal trumpet, obtunded.  Ophthalmologic - fundi not visualized due to noncooperation.  Cardiovascular - Regular rhythm but tachycardia.  Neuro - on nasal trumpet, obtunded, eyes closed, not open with voice, barely open with painful stimuli. Moaning to pain but nonverbal, not following commands. Eyes right gaze preference, not crossing midline, spontaneous  rolling eyes on the right direction. PERRL. Not blinking to visual threat bilaterally. Left facial droop. Tongue protrusion not cooperative. LUE flaccid, mild withdraw to pain. LLE 3-/5 on pain stimulation. RUE spontaneous against gravity and purposeful. RLE 3/5 on pain stimulation. Sensation, coordination and gait not tested.   ASSESSMENT/PLAN Ms. Jan FiremanMarie S Penna is a 74 y.o. female with history of HTN, HLD, DB presenting with slurred speech, L sided weakness and ataxia. Received IV tPA 03/23/2020 at 2252. Worsening in ED. CTA confirmed distal R M1 occlusion. Taken to IR.    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 ASPECTS 10.     CTA head & neck distal R M2 occlusion w/ moderate collateral flow R MCA territory.   Cerebral angio angio w/ TICI3 revascularization R M1 occlusion   Post IR CT no ICH or mass effect   CT head 6/14 0234 no acute abnormality. Small vessel disease.   CT head 6/14 1232 R MCA territory infarct sparing basal ganglia  CT head 6/14 2159 progressive R MCA territory infarct w/ basal ganglia sparing. Hyperdense R M2  MRI  pending  MRA  pending  EEG pending   LE Doppler  No DVT  2D Echo EF 70-75%. No obvious source of embolus. ?Echodensity LA side of MV -> recommend TEE  TG>500. Direct LDL 63.8  HgbA1c 8.9  SCDs for VTE prophylaxis  aspirin 81 mg daily prior to admission, now on aspirin 300 mg suppository daily.   Therapy recommendations:  pending   Disposition:  pending   Acute Respiratory Failure  Intubated for IR, left intubated d/t slow response to reversal and involuntary flapping of RUE   Extubated 6/14 followed by coffee ground emesis   Weak cough requiring NTS suctioning  On nasal trumpet  High risk for re-intubation  CCM onboard  Encephalopathy Emesis post extubation   Fever Hydronephrosis  Coffee ground emesis s/p extubation  TMax 102.8->101.7  Hemodynamically stable  KUB w/o ileus, + hydroureteronephrosis   UA w/ WBC > 50 & UCx pending    BCx pending    Trop 28->45   Lactic acid 4.1->3.2->2.6  Cefepime 6/14>>6/15  Meropenem 6/15>>  Hypertension  Home meds:  amlodipine 5, lisinopril 20, metoprolol 100 bid  . On Cleviprex -> wean off if able . SBP goal now < 180 . Long-term BP goal normotensive  Hyperlipidemia  Home meds:  pravachol 40  TG>500. Direct LDL 63.8, goal < 70  Add statin once po access  Continue statin at discharge  Diabetes type II Uncontrolled  Home meds:  trulicity 0.75, glimepiride 1, metformin 500 bid, ozempic 0.25-0.5  HgbA1c 8.9, goal < 7.0  Hyperglycemia -> off D5  CBGs  SSI  Close PCP follow up for better DM control  Dysphagia . Secondary to stroke . NPO . For Cortrak placement tomorrow and TF . Speech on board   Other Stroke Risk Factors  Advanced age  Family hx stroke (father)  Other Active Problems  Leukocytosis, WBC 12.2->14.3  AKI on CRF, Cre 1.6->1.54->1.7. renal US +B cysts, no obstruction   Tachycardia - likely due to fever  Hospital day # 1  Patient continues to be critically ill for the last 24 hours, has developed respiratory distress, fever, UTI and sepsis, worsening right MCA infarct with reocclusion of right MCA, cerebral edema, continues to have obtundation, tachycardia, and I have adjusted IVF and BP goal, ordered EEG, had long discussion with daughter. I discussed with Dr. Marchelle Gearing.   This patient is critically ill due to right MCA stroke status post TPA, right MCA occlusion status post EVT, respiratory failure, tachycardia and at significant risk of neurological worsening, death form recurrent stroke, hemorrhagic conversion, cerebral edema, brain herniation, heart failure, seizure. This patient's care requires constant monitoring of vital signs, hemodynamics,  respiratory and cardiac monitoring, review of multiple databases, neurological assessment, discussion with family, other specialists and medical decision making of high complexity. I spent 40 minutes of neurocritical care time in the care of this patient.  I had long discussion with daughter at bedside, updated pt current condition, treatment plan and potential prognosis, and answered all the questions. She expressed understanding and appreciation.    Marvel Plan, MD PhD Stroke Neurology 03/25/2020 10:06 AM   To contact Stroke Continuity provider, please refer to [email or web address]. After hours, contact General Neurology

## 2020-03-25 NOTE — Progress Notes (Signed)
PT Cancellation Note  Patient Details [name] MRN: [id number] DOB: 01-22-1946   Cancelled Treatment:    Reason Eval/Treat Not Completed: Active bedrest order.  See OT note.  Thanks,  Corinna Capra, PT, DPT  Acute Rehabilitation [phone] pager #[phone] office       [name] [name] 03/25/2020, 10:40 AM

## 2020-03-25 NOTE — Plan of Care (Signed)
  Problem: Education: Goal: Knowledge of disease or condition will improve Outcome: Progressing Goal: Knowledge of secondary prevention will improve Outcome: Progressing Goal: Knowledge of patient specific risk factors addressed and post discharge goals established will improve Outcome: Progressing Goal: Individualized Educational Video(s) Outcome: Progressing   Problem: Coping: Goal: Will identify appropriate support needs Outcome: Progressing   Problem: Health Behavior/Discharge Planning: Goal: Ability to manage health-related needs will improve Outcome: Progressing   Problem: Nutrition: Goal: Risk of aspiration will decrease Outcome: Progressing   Problem: Ischemic Stroke/TIA Tissue Perfusion: Goal: Complications of ischemic stroke/TIA will be minimized Outcome: Progressing   Problem: Education: Goal: Knowledge of General Education information will improve Description: Including pain rating scale, medication(s)/side effects and non-pharmacologic comfort measures Outcome: Progressing   Problem: Clinical Measurements: Goal: Ability to maintain clinical measurements within normal limits will improve Outcome: Progressing Goal: Cardiovascular complication will be avoided Outcome: Progressing   Problem: Coping: Goal: Level of anxiety will decrease Outcome: Progressing   Problem: Elimination: Goal: Will not experience complications related to bowel motility Outcome: Progressing Goal: Will not experience complications related to urinary retention Outcome: Progressing   Problem: Pain Managment: Goal: General experience of comfort will improve Outcome: Progressing   Problem: Safety: Goal: Ability to remain free from injury will improve Outcome: Progressing   Problem: Skin Integrity: Goal: Risk for impaired skin integrity will decrease Outcome: Progressing

## 2020-03-26 ENCOUNTER — Inpatient Hospital Stay (HOSPITAL_COMMUNITY): Payer: Medicare Other | Admitting: Certified Registered"

## 2020-03-26 ENCOUNTER — Inpatient Hospital Stay (HOSPITAL_COMMUNITY): Payer: Medicare Other

## 2020-03-26 ENCOUNTER — Encounter (HOSPITAL_COMMUNITY)
Admission: EM | Disposition: A | Discharge status: Rehabilitation Facility | Payer: Self-pay | Source: Home / Self Care | Attending: Neurology

## 2020-03-26 DIAGNOSIS — I340 Nonrheumatic mitral (valve) insufficiency: Secondary | ICD-10-CM

## 2020-03-26 DIAGNOSIS — R509 Fever, unspecified: Secondary | ICD-10-CM

## 2020-03-26 DIAGNOSIS — I639 Cerebral infarction, unspecified: Secondary | ICD-10-CM

## 2020-03-26 DIAGNOSIS — E7800 Pure hypercholesterolemia, unspecified: Secondary | ICD-10-CM

## 2020-03-26 DIAGNOSIS — I10 Essential (primary) hypertension: Secondary | ICD-10-CM

## 2020-03-26 DIAGNOSIS — E1165 Type 2 diabetes mellitus with hyperglycemia: Secondary | ICD-10-CM

## 2020-03-26 HISTORY — PX: BUBBLE STUDY: SHX6837

## 2020-03-26 HISTORY — PX: TEE WITHOUT CARDIOVERSION: SHX5443

## 2020-03-26 LAB — GLUCOSE, CAPILLARY
Glucose-Capillary: 120 mg/dL — ABNORMAL HIGH (ref 70–99)
Glucose-Capillary: 127 mg/dL — ABNORMAL HIGH (ref 70–99)
Glucose-Capillary: 111 mg/dL — ABNORMAL HIGH (ref 70–99)
Glucose-Capillary: 131 mg/dL — ABNORMAL HIGH (ref 70–99)
Glucose-Capillary: 119 mg/dL — ABNORMAL HIGH (ref 70–99)
Glucose-Capillary: 156 mg/dL — ABNORMAL HIGH (ref 70–99)

## 2020-03-26 LAB — COMPREHENSIVE METABOLIC PANEL
ALT: 14 U/L (ref 0–44)
AST: 21 U/L (ref 15–41)
Albumin: 2.4 g/dL — ABNORMAL LOW (ref 3.5–5.0)
Alkaline Phosphatase: 50 U/L (ref 38–126)
Anion gap: 11 (ref 5–15)
BUN: 20 mg/dL (ref 8–23)
CO2: 19 mmol/L — ABNORMAL LOW (ref 22–32)
Calcium: 7.7 mg/dL — ABNORMAL LOW (ref 8.9–10.3)
Chloride: 111 mmol/L (ref 98–111)
Creatinine, Ser: 1.37 mg/dL — ABNORMAL HIGH (ref 0.44–1.00)
GFR calc Af Amer: 44 mL/min — ABNORMAL LOW (ref >60)
GFR calc non Af Amer: 38 mL/min — ABNORMAL LOW (ref >60)
Glucose, Bld: 138 mg/dL — ABNORMAL HIGH (ref 70–99)
Potassium: 3.5 mmol/L (ref 3.5–5.1)
Sodium: 141 mmol/L (ref 135–145)
Total Bilirubin: 0.9 mg/dL (ref 0.3–1.2)
Total Protein: 5.6 g/dL — ABNORMAL LOW (ref 6.5–8.1)

## 2020-03-26 LAB — URINE CULTURE: Culture: >=100000 — AB

## 2020-03-26 LAB — PROCALCITONIN: Procalcitonin: 0.34 ng/mL

## 2020-03-26 LAB — CBC
HCT: 30.7 % — ABNORMAL LOW (ref 36.0–46.0)
Hemoglobin: 10.2 g/dL — ABNORMAL LOW (ref 12.0–15.0)
MCH: 29.6 pg (ref 26.0–34.0)
MCHC: 33.2 g/dL (ref 30.0–36.0)
MCV: 89 fL (ref 80.0–100.0)
Platelets: 190 10*3/uL (ref 150–400)
RBC: 3.45 MIL/uL — ABNORMAL LOW (ref 3.87–5.11)
RDW: 14.1 % (ref 11.5–15.5)
WBC: 11.6 10*3/uL — ABNORMAL HIGH (ref 4.0–10.5)
nRBC: 0 % (ref 0.0–0.2)

## 2020-03-26 LAB — PROTIME-INR
INR: 1.1 (ref 0.8–1.2)
Prothrombin Time: 14.2 seconds (ref 11.4–15.2)

## 2020-03-26 LAB — MAGNESIUM: Magnesium: 1 mg/dL — ABNORMAL LOW (ref 1.7–2.4)

## 2020-03-26 SURGERY — ECHOCARDIOGRAM, TRANSESOPHAGEAL
Anesthesia: Monitor Anesthesia Care

## 2020-03-26 MED ORDER — SODIUM CHLORIDE 0.9 % IV SOLN
INTRAVENOUS | Status: DC | PRN
Start: 2020-03-26 — End: 2020-03-26

## 2020-03-26 MED ORDER — ASPIRIN EC 325 MG PO TBEC: 325.0000 mg | DELAYED_RELEASE_TABLET | Freq: Every day | ORAL | Status: DC

## 2020-03-26 MED ORDER — AMLODIPINE BESYLATE 10 MG PO TABS
10.0000 mg | ORAL_TABLET | Freq: Every day | ORAL | Status: DC
  Administered 2020-03-26 – 2020-04-05 (×11): 10 mg
  Filled 2020-03-26 (×11): qty 1

## 2020-03-26 MED ORDER — PRAVASTATIN SODIUM 40 MG PO TABS
40.0000 mg | ORAL_TABLET | Freq: Every day | ORAL | Status: DC
  Administered 2020-03-27 – 2020-04-05 (×10): 40 mg
  Filled 2020-03-26 (×10): qty 1

## 2020-03-26 MED ORDER — ASPIRIN 325 MG PO TABS
325.0000 mg | ORAL_TABLET | Freq: Every day | ORAL | Status: DC
  Administered 2020-03-26 – 2020-04-05 (×11): 325 mg
  Filled 2020-03-26 (×11): qty 1

## 2020-03-26 MED ORDER — HEPARIN SODIUM (PORCINE) 5000 UNIT/ML IJ SOLN
5000.0000 [IU] | Freq: Three times a day (TID) | INTRAMUSCULAR | Status: DC
  Administered 2020-03-26 – 2020-04-11 (×48): 5000 [IU] via SUBCUTANEOUS
  Filled 2020-03-26 (×48): qty 1

## 2020-03-26 MED ORDER — MAGNESIUM SULFATE 4 GM/100ML IV SOLN
4.0000 g | Freq: Once | INTRAVENOUS | Status: AC
  Administered 2020-03-26: 4 g via INTRAVENOUS
  Filled 2020-03-26: qty 100

## 2020-03-26 MED ORDER — SODIUM CHLORIDE 0.9 % IV SOLN: INTRAVENOUS | Status: DC

## 2020-03-26 MED ORDER — CLOPIDOGREL BISULFATE 75 MG PO TABS
75.0000 mg | ORAL_TABLET | Freq: Every day | ORAL | Status: DC
  Administered 2020-03-26 – 2020-04-05 (×11): 75 mg
  Filled 2020-03-26 (×12): qty 1

## 2020-03-26 MED ORDER — DEXTROSE IN LACTATED RINGERS 5 % IV SOLN: INTRAVENOUS | Status: DC

## 2020-03-26 MED ORDER — CLOPIDOGREL BISULFATE 75 MG PO TABS: 75.0000 mg | ORAL_TABLET | Freq: Every day | ORAL | Status: DC

## 2020-03-26 MED ORDER — FENTANYL CITRATE (PF) 100 MCG/2ML IJ SOLN: 25.0000 ug | INTRAMUSCULAR | Status: DC | PRN

## 2020-03-26 MED ORDER — ONDANSETRON HCL 4 MG/2ML IJ SOLN: 4.0000 mg | Freq: Once | INTRAMUSCULAR | Status: DC | PRN

## 2020-03-26 MED ORDER — DOCUSATE SODIUM 50 MG/5ML PO LIQD
100.0000 mg | Freq: Two times a day (BID) | ORAL | Status: DC
  Administered 2020-03-26 – 2020-04-05 (×17): 100 mg
  Filled 2020-03-26 (×19): qty 10

## 2020-03-26 MED ORDER — PROPOFOL 10 MG/ML IV BOLUS
INTRAVENOUS | Status: DC | PRN
Start: 2020-03-26 — End: 2020-03-26
  Administered 2020-03-26: 10 mg via INTRAVENOUS

## 2020-03-26 MED ORDER — PANTOPRAZOLE SODIUM 40 MG PO PACK
40.0000 mg | PACK | Freq: Every day | ORAL | Status: DC
  Administered 2020-03-26 – 2020-04-04 (×10): 40 mg
  Filled 2020-03-26 (×11): qty 20

## 2020-03-26 MED ORDER — PRAVASTATIN SODIUM 40 MG PO TABS: 40.0000 mg | ORAL_TABLET | Freq: Every day | ORAL | Status: DC

## 2020-03-26 MED ORDER — POLYETHYLENE GLYCOL 3350 17 G PO PACK
17.0000 g | PACK | Freq: Every day | ORAL | Status: DC
  Administered 2020-03-27 – 2020-04-05 (×8): 17 g
  Filled 2020-03-26 (×9): qty 1

## 2020-03-26 MED ORDER — PROPOFOL 500 MG/50ML IV EMUL
INTRAVENOUS | Status: DC | PRN
Start: 2020-03-26 — End: 2020-03-26
  Administered 2020-03-26: 150 ug/kg/min via INTRAVENOUS

## 2020-03-26 NOTE — Anesthesia Preprocedure Evaluation (Signed)
Anesthesia Evaluation  Patient identified by MRN, date of birth, ID band Patient awake    Reviewed: Allergy & Precautions, NPO status , Patient's Chart, lab work & pertinent test results, reviewed documented beta blocker date and time   Airway Mallampati: II  TM Distance: >3 FB Neck ROM: Full    Dental  (+) Dental Advisory Given, Edentulous Lower, Edentulous Upper   Pulmonary neg pulmonary ROS,  ARF s/p IR procedure, extubated 6/14, known post-intubation emesis. Now requiring nasal trumpet.   Pulmonary exam normal breath sounds clear to auscultation       Cardiovascular hypertension, Pt. on home beta blockers and Pt. on medications Normal cardiovascular exam+ Valvular Problems/Murmurs (MV vegetation)  Rhythm:Regular Rate:Normal     Neuro/Psych Acute R MCA infarct s/p tPA and IR revascularization CVA, Residual Symptoms    GI/Hepatic negative GI ROS, Neg liver ROS,   Endo/Other  diabetes, Type 2, Oral Hypoglycemic AgentsHypothyroidism   Renal/GU Renal InsufficiencyRenal disease     Musculoskeletal  (+) Arthritis ,   Abdominal   Peds  Hematology  (+) Blood dyscrasia, anemia ,   Anesthesia Other Findings Day of surgery medications reviewed with the patient.  Reproductive/Obstetrics                             Anesthesia Physical Anesthesia Plan  ASA: III  Anesthesia Plan: MAC   Post-op Pain Management:    Induction: Intravenous  PONV Risk Score and Plan: 2 and Propofol infusion and Treatment may vary due to age or medical condition  Airway Management Planned: Nasal Cannula and Natural Airway  Additional Equipment:   Intra-op Plan:   Post-operative Plan:   Informed Consent: I have reviewed the patients History and Physical, chart, labs and discussed the procedure including the risks, benefits and alternatives for the proposed anesthesia with the patient or authorized representative  who has indicated his/her understanding and acceptance.     Dental advisory given  Plan Discussed with: CRNA and Anesthesiologist  Anesthesia Plan Comments:         Anesthesia Quick Evaluation

## 2020-03-26 NOTE — Anesthesia Procedure Notes (Signed)
Procedure Name: MAC Date/Time: 03/26/2020 1:21 PM Performed by: Amadeo Garnet, CRNA Pre-anesthesia Checklist: Patient identified, Emergency Drugs available, Suction available and Patient being monitored Patient Re-evaluated:Patient Re-evaluated prior to induction Oxygen Delivery Method: Nasal cannula Preoxygenation: Pre-oxygenation with 100% oxygen Induction Type: IV induction Placement Confirmation: positive ETCO2 Dental Injury: Teeth and Oropharynx as per pre-operative assessment

## 2020-03-26 NOTE — CV Procedure (Signed)
     Transesophageal Echocardiogram Note  TENAYA HILYER [id number] 23-Jun-1946  Procedure: Transesophageal Echocardiogram Indications: stroke, suspicion for a mitral valve mass  Procedure Details Consent: Obtained Time Out: Verified patient identification, verified procedure, site/side was marked, verified correct patient position, special equipment/implants available, Radiology Safety Procedures followed,  medications/allergies/relevent history reviewed, required imaging and test results available.  Performed  Medications: During this procedure the patient is administered a IV propofol and lidocaine administered by anesthesia staff for deep sedation.  The patient's heart rate, blood pressure, and oxygen saturation are monitored continuously during the procedure. The period of conscious sedation is 30 minutes, of which I was present face-to-face 100% of this time.    Left Ventrical:  LVEF 65-70%  Mitral Valve: mild MR, no vegetation or masses  Aortic Valve: no AI, no vegetation  Tricuspid Valve: mild TR, no vegetation  Pulmonic Valve: no PR, no vegetation  Left Atrium/ Left atrial appendage: no thrombus, normal velocities  Atrial septum: no PFO or ASD, negative bubble study  Aorta: mild plaque  Complications: No apparent complications Patient did tolerate procedure well.  Tobias Alexander, MD, [place] 03/26/2020, 3:04 PM

## 2020-03-26 NOTE — Progress Notes (Addendum)
STROKE TEAM PROGRESS NOTE   INTERVAL HISTORY Pt back from TEE. RN at bedside. As per RN, pt was more awake alert this am and able to follow simple commands. However, at this time, pt was sleepy drowsy and difficulty arouse, likely related to sedation from TEE.   Vitals:   03/26/20 0730 03/26/20 0800 03/26/20 0900 03/26/20 1000  BP: (!) 159/87 (!) 193/78 128/67 133/76  Pulse: (!) 104 100 86 85  Resp: (!) 23 20 18 18   Temp:  99.6 F (37.6 C)    TempSrc:  Axillary    SpO2: 99% 99% 95% 98%  Weight:       CBC:  Recent Labs  Lab 03/23/20 2227 03/23/20 2229 03/24/20 0450 03/24/20 1527 03/25/20 0055 03/26/20 0727  WBC 9.4   < > 12.2*   < > 14.3* 11.6*  NEUTROABS 6.5  --  10.0*  --   --   --   HGB 13.4   < > 12.1   < > 11.1* 10.2*  HCT 40.8   < > 34.8*   < > 32.8* 30.7*  MCV 89.7   < > 87.9   < > 87.7 89.0  PLT 222   < > 224   < > 187 190   < > = values in this interval not displayed.   Basic Metabolic Panel:  Recent Labs  Lab 03/25/20 0055 03/26/20 0727  NA 139 141  K 3.9 3.5  CL 109 111  CO2 19* 19*  GLUCOSE 248* 138*  BUN 33* 20  CREATININE 1.70* 1.37*  CALCIUM 8.5* 7.7*  MG  --  1.0*   Lipid Panel:     Component Value Date/Time   CHOL 156 03/24/2020 0450   CHOL 163 11/15/2019 0859   TRIG 618 (H) 03/24/2020 0450   TRIG 605 (H) 03/24/2020 0450   TRIG 456 (H) 07/03/2013 1504   HDL 27 (L) 03/24/2020 0450   HDL 33 (L) 11/15/2019 0859   HDL 36 (L) 07/03/2013 1504   CHOLHDL 5.8 03/24/2020 0450   VLDL UNABLE TO CALCULATE IF TRIGLYCERIDE OVER 400 mg/dL 16/10/960406/14/2021 [id number]   LDLCALC UNABLE TO CALCULATE IF TRIGLYCERIDE OVER 400 mg/dL 81/19/147806/14/2021 [id number]   LDLCALC 83 11/15/2019 0859   LDLCALC NOTES: 07/03/2013 1504   HgbA1c:  Lab Results  Component Value Date   HGBA1C 8.9 (H) 03/24/2020   Urine Drug Screen: No results found for: LABOPIA, COCAINSCRNUR, LABBENZ, AMPHETMU, THCU, LABBARB  Alcohol Level No results found for: ETH  IMAGING past 24 hours MR ANGIO HEAD WO  CONTRAST  Result Date: 03/25/2020 CLINICAL DATA:  Follow-up examination for acute stroke. EXAM: MRA HEAD WITHOUT CONTRAST TECHNIQUE: Angiographic images of the Circle of Willis were obtained using MRA technique without intravenous contrast. COMPARISON:  Prior brain MRI from earlier the same day as well as recent CTA from 03/23/2020. FINDINGS: ANTERIOR CIRCULATION: Examination degraded by motion artifact, limiting assessment. Distal cervical segments of the internal carotid arteries are widely patent with symmetric antegrade flow. Petrous, cavernous, and supraclinoid ICAs patent without appreciable stenosis or other abnormality. A1 segments patent bilaterally. Normal anterior communicating artery complex. Anterior cerebral arteries remain widely patent to their distal aspects without stenosis. Right M1 segment patent proximally, but poorly visualized at its mid and distal aspect, and suspected to be reoccluded since prior catheter directed intervention. Right MCA bifurcation not well seen. Few small attenuated branches remain patent within the distal right MCA distribution, relatively stable from initial CTA. Left M1 segment widely patent. Normal left  MCA bifurcation. Distal left MCA branches well perfused. POSTERIOR CIRCULATION: Vertebral arteries patent to the vertebrobasilar junction without stenosis. Patent right PICA. Left PICA not seen. Basilar patent to its distal aspect without stenosis. Superior cerebral arteries grossly patent proximally. Left PCA supplied via the basilar. Predominant fetal type origin of the right PCA. PCAs remain patent to their distal aspects without stenosis. No appreciable intracranial aneurysm. IMPRESSION: 1. Technically limited exam due to motion artifact. 2. Probable reocclusion of the distal right M1 segment since recent catheter directed revascularization. Few attenuated small vessels remain patent within the right MCA distribution distally. Overall, appearance is similar as  compared to initial presenting CTA. 3. No other intracranial large vessel occlusion or high-grade stenosis. Electronically Signed   By: Rise Mu M.D.   On: 03/25/2020 21:08   MR BRAIN WO CONTRAST  Result Date: 03/25/2020 CLINICAL DATA:  Follow-up examination for acute stroke. EXAM: MRI HEAD WITHOUT CONTRAST TECHNIQUE: Multiplanar, multiecho pulse sequences of the brain and surrounding structures were obtained without intravenous contrast. COMPARISON:  Prior head CT from 03/24/2013 as well as previous studies. FINDINGS: Brain: Examination moderately degraded by motion artifact. Generalized age-related cerebral atrophy. Patchy T2/FLAIR hyperintensity within the periventricular and deep white matter both cerebral hemispheres as well as the pons, most consistent with chronic microvascular ischemic disease. Few scatter remote lacunar infarcts noted about the bilateral basal ganglia and thalami. Small remote right cerebellar infarct with associated chronic hemosiderin staining noted. Large confluent area of restricted diffusion seen involving the majority of the right MCA distribution, consistent with large acute right MCA territory infarct. Associated gyral swelling and edema throughout the area of infarction. Regional mass effect without significant right-to-left midline shift. Basilar cisterns remain widely patent. Associated petechial hemorrhage throughout the area of infarction without frank hemorrhagic transformation. Additional scattered patchy small volume foci of acute ischemia seen involving the right PCA territory, with involvement of the splenium, right thalamus, and right temporal occipital region. This is likely based on the predominant fetal type origin right PCA seen on corresponding CTA and MRA. Additional punctate focus of acute ischemia seen at the posterior left frontal periventricular white matter (series 2, image 25). No mass lesion or hydrocephalus. No extra-axial fluid collection.  Pituitary gland suprasellar region within normal limits. Midline structures intact. Vascular: Loss of normal flow void within the distal right M1 segment extending into right M2 branches at the sylvian fissure. Associated susceptibility artifact seen on SWI sequence (series 7, image 33), consistent with thrombus. Finding consistent with reocclusion of previously revascularized right MCA, also seen on corresponding MRA. Major intracranial vascular flow voids otherwise maintained. Skull and upper cervical spine: Craniocervical junction within normal limits. Bone marrow signal intensity normal. No scalp soft tissue abnormality. Sinuses/Orbits: Patient status post bilateral ocular lens replacement. Globes and orbital soft tissues demonstrate no acute finding. Paranasal sinuses are largely clear. No significant mastoid effusion. Other: None. IMPRESSION: 1. 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 Associated petechial hemorrhage without frank hemorrhagic transformation. 2. Additional scattered small volume acute ischemic nonhemorrhagic right PCA territory infarcts, likely based on the fetal type origin right PCA as seen on corresponding MRA and CTA. 3. Additional punctate focus of acute ischemia involving the posterior left frontal periventricular white matter. 4. Loss of normal flow void with susceptibility artifact involving distal right M1/M2 branch, consistent with reocclusion. Finding consistent with findings seen on corresponding MRA. 5. Underlying age-related cerebral atrophy with chronic microvascular ischemic disease, with a few scattered remote lacunar infarcts involving  the bilateral basal ganglia and thalami. Electronically Signed   By: Rise Mu M.D.   On: 03/25/2020 21:28   DG CHEST PORT 1 VIEW  Result Date: 03/26/2020 CLINICAL DATA:  Code stroke EXAM: PORTABLE CHEST 1 VIEW COMPARISON:  03/25/2020 FINDINGS: Cardiomediastinal contours and hilar structures are stable  with central pulmonary vascular engorgement, heart size accentuated by low volumes and portable technique. No consolidation. No pleural effusion. Suspect subsegmental RIGHT lower lobe atelectasis as before. Visualized skeletal structures are unremarkable. IMPRESSION: No acute cardiopulmonary disease. Mild RIGHT basilar atelectasis similar to prior studies. Electronically Signed   By: Donzetta Kohut M.D.   On: 03/26/2020 08:32   EEG adult  Result Date: 03/25/2020 Charlsie Quest, MD     03/25/2020  1:44 PM Patient Name: CLOIS MONTAVON MRN: [id number] Epilepsy Attending: Charlsie Quest Referring Physician/Provider: Dr. Marvel Plan Date: 03/25/2020 Duration: 25.29 minutes Patient history: 74 year old female presenting with slurred speech, left-sided weakness and ataxia.  MRI brain showed right MCA infarct.  EEG evaluate for seizures. Level of alertness: Awake, asleep AEDs during EEG study: None Technical aspects: This EEG study was done with scalp electrodes positioned according to the 10-20 International system of electrode placement. Electrical activity was acquired at a sampling rate of 500Hz  and reviewed with a high frequency filter of 70Hz  and a low frequency filter of 1Hz . EEG data were recorded continuously and digitally stored. Description: During awake state, no clear posterior dominant rhythm was seen.  Sleep was characterized by vertex, maximal frontocentral region.  EEG showed continuous generalized and lateralized right hemisphere 3 to 6 Hz theta-delta slowing.  Sharp waves were also seen in right frontal region which appeared rhythmic without any clear evolution when patient was stimulated at the end of the study.  Hyperventilation and photic stimulation were not performed.   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  The sharp waves appeared rhythmic when patient was stimulated without any clear evolution and is more likely related to arousal.  No definite seizures were seen during the study.    PHYSICAL EXAM    Temp:  [99.4 F (37.4 C)-100 F (37.8 C)] 99.6 F (37.6 C) (06/16 0800) Pulse Rate:  [80-117] 85 (06/16 1000) Resp:  [16-27] 18 (06/16 1000) BP: (126-193)/(65-95) 133/76 (06/16 1000) SpO2:  [94 %-100 %] 98 % (06/16 1000) Weight:  [82.4 kg] 82.4 kg (06/16 0437)  General - Well nourished, well developed, drowsy sleepy.  Ophthalmologic - fundi not visualized due to noncooperation.  Cardiovascular - Regular rhythm and rate.  Neuro - drowsy sleepy, eyes closed, barely open with voice stimuli. Moaning to touch, intangible words, not following commands. Eyes right gaze preference, not crossing midline, spontaneous rolling eyes on the right direction. PERRL. Not blinking to visual threat bilaterally. Left facial droop. Tongue protrusion not cooperative. LUE flaccid, mild withdraw to pain. LLE 2/5 withdraw on pain stimulation. RUE spontaneous against gravity and purposeful. RLE 3/5 on pain stimulation. Sensation, coordination and gait not tested.   ASSESSMENT/PLAN Ms. ELIA NUNLEY is a 74 y.o. female with history of HTN, HLD, DB presenting with slurred speech, L sided weakness and ataxia. Received IV tPA 03/23/2020 at 2252. Worsening in ED. CTA confirmed distal R M1 occlusion. Taken to IR.    Stroke:  R MCA infarct  s/p tPA and IR w/ TICI3 revascularization but re-occluded on follow up imaging, infarct embolic secondary to unknown source  Code Stroke CT head No acute abnormality. ASPECTS 10.     CTA head & neck distal R M2 occlusion w/ moderate collateral flow R MCA territory.   Cerebral angio angio w/ TICI3 revascularization R M1 occlusion   Post IR CT no ICH or mass effect   CT head 6/14 0234 no  acute abnormality. Small vessel disease.   CT head 6/14 1232 R MCA territory infarct sparing basal ganglia  CT head 6/14 2159 progressive R MCA territory infarct w/ basal ganglia sparing. Hyperdense R M2  MRI  Large R MCA territory infarct w/ petechial hemorrhage. Scattered small R PCA territory infarcts. Punctate poster L periventricular WM infarct. Old B basal ganglia and thalamic lacunes.   MRA  Reocclusion distal R M1  EEG R frontal sharp waves, generalized and lateralized R brain slowing  LE Doppler  No DVT  2D Echo EF 70-75%. No obvious source of embolus. ?Echodensity L side of MV  TEE normal MV, EF 70-75%, no PFO  Will consider loop recorder once stable before d/c  TG>500->pending, Direct LDL 63.8  HgbA1c 8.9  Heparin IV for VTE prophylaxis  aspirin 81 mg daily prior to admission, now on ASA    Therapy recommendations:  pending   Disposition:  pending   Acute Respiratory Failure  Intubated for IR, left intubated d/t slow response to reversal and involuntary flapping of RUE   Extubated 6/14 followed by coffee ground emesis   Bronchial hygeine  Was on nasal trumpet -> now off  Still high risk for decline  CCM following  Encephalopathy Emesis post extubation  Fever, Elevated Lactate  Coffee ground emesis s/p extubation  TMax 102.8->101.7->afebrile   Hemodynamically stable  KUB w/o ileus, + hydroureteronephrosis   UA w/ WBC > 50 & UCx E coli  BCx no growth thus far   Trop 28->45   Lactic acid 4.1->3.2->2.6  Cefepime 6/14>>6/15  Meropenem 6/15>>  Hypertension  Home meds:  amlodipine 5, lisinopril 20, metoprolol 100 bid  . On Cleviprex -> off  . SBP goal now < 180 . On amlodipine 10 . Long-term BP goal normotensive  Hyperlipidemia  Home meds:  pravachol 40  TG>500->pending, Direct LDL 63.8, goal < 70  Resumed pravastatin 40  Continue statin at discharge  Diabetes type II Uncontrolled  Home meds:  trulicity 1.61, glimepiride 1,  metformin 500 bid, ozempic 0.25-0.5  HgbA1c 8.9, goal < 7.0  Hyperglycemia -> off D5  On TF  CBGs  SSI  Close PCP follow up for better DM control  Dysphagia . Secondary to stroke . NPO . Cortrak placed . TF @ 55 and NS @ 20 . Speech on board   Other Stroke Risk Factors  Advanced age  Family hx stroke (father)  Other Active Problems  Leukocytosis, WBC 12.2->14.3->11.6  AKI on CRF, Cre 1.6->1.54->1.7->1.37. renal US +B cysts, no obstruction   Hospital day # 2  Patient continues to be critically ill for the last 24 hours, has large right MCA infarct confirmed on MRI and reocclusion of MCA confirmed on MRA, continues to have drowsy sleepy and left hemiparesis, and high risk for respiratory decline, and I have resumed part of BP home meds and statin after cortrak placed, started DAPT for stroke prevention, will consider loop once stable.   This patient is critically ill due to right MCA stroke status post TPA, right MCA occlusion  status post EVT, respiratory failure, tachycardia and at significant risk of neurological worsening, death form recurrent stroke, hemorrhagic conversion, cerebral edema, brain herniation, heart failure, seizure. This patient's care requires constant monitoring of vital signs, hemodynamics, respiratory and cardiac monitoring, review of multiple databases, neurological assessment, discussion with family, other specialists and medical decision making of high complexity. I spent 35 minutes of neurocritical care time in the care of this patient.   Marvel Plan, MD PhD Stroke Neurology 03/26/2020 10:49 AM   To contact Stroke Continuity provider, please refer to [email or web address]. After hours, contact General Neurology

## 2020-03-26 NOTE — Anesthesia Postprocedure Evaluation (Signed)
Anesthesia Post Note  Patient: Meredith Shaw  Procedure(s) Performed: TRANSESOPHAGEAL ECHOCARDIOGRAM (TEE) (N/A ) BUBBLE STUDY     Patient location during evaluation: PACU Anesthesia Type: MAC Level of consciousness: awake and alert Pain management: pain level controlled Vital Signs Assessment: post-procedure vital signs reviewed and stable Respiratory status: spontaneous breathing, nonlabored ventilation, respiratory function stable and patient connected to nasal cannula oxygen Cardiovascular status: stable and blood pressure returned to baseline Postop Assessment: no apparent nausea or vomiting Anesthetic complications: no   No complications documented.  Last Vitals:  Vitals:   03/26/20 1500 03/26/20 1600  BP: (!) 160/88 (!) 150/86  Pulse: 97 98  Resp: (!) 24 19  Temp:    SpO2: 96% 97%    Last Pain:  Vitals:   03/26/20 1600  TempSrc:   PainSc: 0-No pain                 Catalina Gravel

## 2020-03-26 NOTE — Progress Notes (Signed)
  Echocardiogram Echocardiogram Transesophageal has been performed.  Meredith Shaw 03/26/2020, 1:53 PM

## 2020-03-26 NOTE — Progress Notes (Signed)
NAME:  Meredith Shaw, MRN:  [id number], DOB:  02-12-1946, LOS: 2 ADMISSION DATE:  03/23/2020, CONSULTATION DATE: 03/24/2020 REFERRING MD:  A[place]or, CHIEF COMPLAINT: Right MCA M1 stroke  Brief History   74 year old female presented as code stroke, CT head negative, CTA showed right MCA M1 segment occlusion.  Status post TPA and stentriever on 03/24/2020. Course complicated by sepsis. Found to have UTI, treated with antibiotics.  Past Medical History  Hypertension, diabetes, hypothyroidism, hypertensive retinopathy  Significant Hospital Events   tPA movement at 2252 on 03/23/2020 S/P RT CCA angiogram followed by complete revascularization of occluded RT MCA M 1 seg with x 1 pass with 85mm x 37 mm embotrap retriver and x 1 pass with solitaire50mm x 40 mm X retriver and penumbra aspiration  Achieving a TICI 3 revascularization.  6/14 - Episode of dark emesis, lethargic. Suspected to have UTI with lactate to 4.1. Started on Abx.  6/16 - mental status improved, pt awake and responsive to commands. Urine cx growing E coli. Continuing meropenem  Consults:  Neurology Interventional radiology  Procedures:  S/P RT CCA angiogram followed by complete revascularization of occluded RT MCA M 1 seg with x 1 pass with 20mm x 37 mm embotrap retriver and x 1 pass with solitaire61mm x 40 mm X retriver and penumbra aspiration  Achieving a TICI 3 revascularization.  Significant Diagnostic Tests:  CT head 6/13: No acute intracranial abnormality  CT angio head and neck:  1. Distal right MCA M1 segment occlusion with moderate collateral flow in the right MCA territory. 2. No other intracranial arterial occlusion or high-grade stenosis. 3. Normal cervical carotid and vertebral arteries  CT head 6/14 > neg.  X-ray Abdomen 6/14 > moderate right and mild left hydroureteronephrosis. No obstruction  CT head 6/14 > progressive right MCA infarct. No hemorrhage. Hyperdense R M2 segment sylvian fissure compatible with  thrombosis  US Renal 6/14 > Asymmetric right atrophy, bilateral kidney cysts  Echo 6/14 > LVEF 70-75%. Echodensity to L atrial side of MV c/f vegetation. Recommend TEE  MRI 6/15 > Large evolving ischemic R MCA territory infarct. No hemorraghic transformation.  MRA > Reocclusion of R M1 segment  Micro Data:  MRSA 6/14 > neg Urine cx 6/14 > E coli Blood cx 6/14 > neg  Antimicrobials:  Cefepime 6/14 > 6/15 Meropenem 6/15 >   Interim history/subjective:  No acute events overnight. Patient has been significantly more awake and alert since yesterday afternoon. She is denying pain and denies abdominal discomfort.   Urine culture grew E coli. Still on meropenem.  Objective   Blood pressure (!) 159/87, pulse (!) 104, temperature 99.6 F (37.6 C), temperature source Axillary, resp. rate (!) 23, weight 82.4 kg, SpO2 99 %.        Intake/Output Summary (Last 24 hours) at 03/26/2020 0933 Last data filed at 03/26/2020 0700 Gross per 24 hour  Intake 2513.47 ml  Output 1600 ml  Net 913.47 ml   Filed Weights   03/23/20 2233 03/26/20 0437  Weight: 77 kg 82.4 kg    Examination: General: Adult female, resting in bed, in NAD. Neuro: Awake and alert. Moves R side spontaneously and has repetitive movements of hitting bed with R arm. Opening eyes and following commands.  HEENT: [place]/AT. Sclerae anicteric. ETT in place. Cardiovascular: RRR, no M/R/G.  Lungs: Respirations even and unlabored.  CTA bilaterally, No W/R/R. Abdomen: BS x 4, soft, NT/ND.  Musculoskeletal: No gross deformities, no edema.  Skin: Intact, warm, no rashes.  Assessment & Plan:  Elevated lactate, fever New onset sepsis found on 6/14. Lactate originally 4.1, most recent at 1.6. WBC downtrending at 11.6 (<--14.3). Last ABG normal. Tmax 102.8. With urine culture growing E coli, most likely 2/2 UTI.   - Meropenem 1g IV q12h - IV tylenol. Temp goal of 99.5, trend temp curve - IVF - normal saline  Need for mechanical  ventilation post procedure: S/p extubation on 6/14. Currently on 2L by [place]. Mental status improved today. - Monitor closely for intubation risk. - Bronchial hygiene.  Right MCA stroke  Status post TPA, stentriever by IR. MRI and MRA showing evolving ischemic R MCA infarct.   - Continue stroke management per neuro. - Cleviprex off - BP control - amlodipine, lisinopril, metoprolol  AKI Cr improved today at 1.37 from 1.7. Husband reports that pt was scheduled to see a nephrologist. No hydronephrosis on renal ultrasound. 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 HbA1c 8.9 - Continue SSI, resistant scale. - Hold home metformin, glimepiride, dulaglutide.  Best practice:  Diet: NPO for now. Will initiate TF following cortrak today Pain/Anxiety/Delirium protocol (if indicated): Fentanyl prn, propofol VAP protocol (if indicated): ordered DVT prophylaxis: per primary team GI prophylaxis: Protonix Glucose control: Insulin SS Mobility: PT Code Status: Full Family Communication: updated daughter at bedside Disposition: remains in Neuro ICU  Critical care time:    Markus Jarvis, MS4

## 2020-03-26 NOTE — Evaluation (Signed)
Occupational Therapy Evaluation Patient Details [name] MRN: [id number] DOB: 11/03/1945 Today's Date: 03/26/2020    History of Present Illness 74 y.o female admitted with sudden onset Lt sided weakness and surred speech.  Tpa was administered.  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 pt extubated 6/14, but had increasing lethsrgy > possibly septic due to UTI.  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  PMH includes: hypertensive retinopathy, DM, arthritis, s/p TKA, s/p ORIF Rt hip, s/p ORIF LT hip.   Clinical Impression   PTA pt living with husband, reports she was independent for BADL/IADL. At time of eval, pt presents with dense L inattention, L hemiplegia, decreased activity tolerance, and decreased cognition. Pt completed bed mobility and sit <> stands with max A +2. Once seated EOB, pt began mildly pushing with RUE which improved when RUE was engaged in task. Pt completed x2 sit <> stands this session to scoot up in bed. She requires multimodal cueing and increased time due to cognitive involvement. No active or purposeful movement was noted on LUE. When pt was shown her L hand she denied it being her own hand. Pt weaned to RA in session with VSS. Encouraged pt cough and sit upright to promote secretion mobilization. Given current status and previous functional level, recommend CIR at d/c for continued functional progression of BADL. Will continue to follow per POC listed below.     Follow Up Recommendations  CIR    Equipment Recommendations  Wheelchair (measurements OT);Wheelchair cushion (measurements OT);Hospital bed    Recommendations for Other Services Rehab consult     Precautions / Restrictions Precautions Precautions: Fall Precaution Comments: L hemi/neglect Restrictions Weight Bearing Restrictions: No      Mobility Bed Mobility Overal bed  mobility: Needs Assistance Bed Mobility: Supine to Sit;Sit to Supine     Supine to sit: Max assist;HOB elevated;+2 for physical assistance Sit to supine: Max assist;+2 for physical assistance;HOB elevated      Transfers Overall transfer level: Needs assistance Equipment used: 1 person hand held assist;2 person hand held assist Transfers: Sit to/from Stand Sit to Stand: Max assist;+2 physical assistance;From elevated surface         General transfer comment: pt performs 1 sit to stand with maxA and L knee block, 2nd sit to stand with maxA x2    Balance Overall balance assessment: Needs assistance Sitting-balance support: Bilateral upper extremity supported;Feet supported Sitting balance-Leahy Scale: Zero Sitting balance - Comments: mod-maxA at times due to L lateral and posterior lean, pt pushing mildly to left with RUE Postural control: Left lateral lean;Posterior lean Standing balance support: Bilateral upper extremity supported Standing balance-Leahy Scale: Zero Standing balance comment: max-totalA to maintain static standing                           ADL either performed or assessed with clinical judgement   ADL Overall ADL's : Needs assistance/impaired Eating/Feeding: NPO Eating/Feeding Details (indicate cue type and reason): [employer] Grooming: Maximal assistance;Brushing hair;Sitting Grooming Details (indicate cue type and reason): able to brush small section on R side of hair while seated EOB. Difficulty crossing midline to L without assist, cannot reach back Upper Body Bathing: Maximal assistance;Sitting   Lower Body Bathing: Total assistance;Sitting/lateral leans;Sit to/from stand   Upper Body Dressing : Maximal assistance;Sitting   Lower Body Dressing: Total assistance;Sitting/lateral leans;Sit to/from stand Lower  Body Dressing Details (indicate cue type and reason): recieves LB dressing assist at baseline Toilet Transfer: Moderate assistance;+2 for  physical assistance;+2 for safety/equipment   Toileting- Clothing Manipulation and Hygiene: Maximal assistance       Functional mobility during ADLs: Maximal assistance;+2 for physical assistance;+2 for safety/equipment       Vision Baseline Vision/History: Wears glasses Wears Glasses: At all times Vision Assessment?: Vision impaired- to be further tested in functional context;Yes Ocular Range of Motion: Restricted on the left;Impaired-to be further tested in functional context Alignment/Gaze Preference: Gaze right;Head turned Tracking/Visual Pursuits: Decreased smoothness of eye movement to LEFT superior field;Decreased smoothness of eye movement to LEFT inferior field;Impaired - to be further tested in functional context Convergence: Impaired - to be further tested in functional context Additional Comments: pt presenting with dense L sided inattention. When shown her L hand she stated it was therapists hand not her own, until cued to look her at her distinct finger nail polish. Pt did not functionally attend to L side of body in session.     Perception     Praxis      Pertinent Vitals/Pain Pain Assessment: No/denies pain     Hand Dominance Right   Extremity/Trunk Assessment Upper Extremity Assessment Upper Extremity Assessment: LUE deficits/detail;RUE deficits/detail RUE Deficits / Details: rhythmic uncontrollable jerking movements, pt looks as if she is tapping hand on mattress LUE Deficits / Details: flaccid, no active movement noted. Dense inattention also noted   Lower Extremity Assessment Lower Extremity Assessment: Defer to PT evaluation RLE Deficits / Details: grossly 4-/5, PT does note tremoring of RLE at rest, intermittent rhythmic tapping of R side pt reports is unintentional RLE Sensation: WNL LLE Deficits / Details: pt with 3-/5 R knee and hip flexion, other motion appears non-purposeful and automatic, limited AROM noted. Pt with some increase in adductor tone,  PROM WFL LLE Sensation: decreased light touch (no response to pain)   Cervical / Trunk Assessment Cervical / Trunk Assessment: Kyphotic   Communication Communication Communication: Other (comment);Expressive difficulties (slurred, slowed speech)   Cognition Arousal/Alertness: Awake/alert Behavior During Therapy: WFL for tasks assessed/performed Overall Cognitive Status: Impaired/Different from baseline Area of Impairment: Attention;Memory;Following commands;Safety/judgement;Awareness;Problem solving                   Current Attention Level: Sustained Memory: Decreased recall of precautions;Decreased short-term memory Following Commands: Follows one step commands consistently;Follows one step commands with increased time Safety/Judgement: Decreased awareness of safety;Decreased awareness of deficits Awareness: Intellectual Problem Solving: Slow processing;Requires verbal cues General Comments: pt with poor awareness into current deficits, needs frequent cues for safety. Difficulty following one step commands without multimodal cues and redirection   General Comments  VSS on 1L [place], PT weans pt to room air with mobility and stable sats, 1L [place] reapplied at end of session    Exercises     Shoulder Instructions      Home Living Family/patient expects to be discharged to:: Private residence Living Arrangements: Spouse/significant other Available Help at Discharge: Family;Available 24 hours/day Type of Home: House Home Access: Stairs to enter [employer] of Steps: 2 Entrance Stairs-Rails: None Home Layout: One level     Bathroom Shower/Tub: Occupational psychologist: Handicapped height     Home Equipment: Environmental consultant - 2 wheels;Bedside commode;Shower seat;Cane - single point;Hand held shower head;Grab bars - toilet;Grab bars - tub/shower;Wheelchair - manual   Additional Comments: DME info obtained from prior admission, needs to be confirmed  Prior  Functioning/Environment Level of Independence: Needs assistance  Gait / Transfers Assistance Needed: pt reports ambulating without assistive device ADL's / Homemaking Assistance Needed: pt requires assistance to don socks            OT Problem List: Decreased strength;Impaired vision/perception;Decreased knowledge of use of DME or AE;Impaired tone;Decreased coordination;Decreased activity tolerance;Decreased cognition;Impaired UE functional use;Impaired balance (sitting and/or standing);Decreased safety awareness;Impaired sensation      OT Treatment/Interventions: Self-care/ADL training;Therapeutic exercise;Visual/perceptual remediation/compensation;Patient/family education;Neuromuscular education;Balance training;Therapeutic activities;Energy conservation;DME and/or AE instruction;Cognitive remediation/compensation    OT Goals(Current goals can be found in the care plan section) Acute Rehab OT Goals Patient Stated Goal: return to independence OT Goal Formulation: With patient Time For Goal Achievement: 04/09/20 Potential to Achieve Goals: Good  OT Frequency: Min 2X/week   Barriers to D/C:            Co-evaluation PT/OT/SLP Co-Evaluation/Treatment: Yes Reason for Co-Treatment: For patient/therapist safety;To address functional/ADL transfers   OT goals addressed during session: ADL's and self-care;Proper use of Adaptive equipment and DME;Strengthening/ROM      AM-PAC OT "6 Clicks" Daily Activity     Outcome Measure Help from another person eating meals?: Total Help from another person taking care of personal grooming?: A Lot Help from another person toileting, which includes using toliet, bedpan, or urinal?: Total Help from another person bathing (including washing, rinsing, drying)?: Total Help from another person to put on and taking off regular upper body clothing?: A Lot Help from another person to put on and taking off regular lower body clothing?: Total 6 Click Score:  8   End of Session Nurse Communication: Mobility status  Activity Tolerance: Patient tolerated treatment well Patient left: in bed;with call bell/phone within reach  OT Visit Diagnosis: Unsteadiness on feet (R26.81);Other abnormalities of gait and mobility (R26.89);Other symptoms and signs involving the nervous system (R29.898);Other symptoms and signs involving cognitive function;Hemiplegia and hemiparesis Hemiplegia - Right/Left: Left Hemiplegia - dominant/non-dominant: Non-Dominant Hemiplegia - caused by: Cerebral infarction                Time: 0254-2706 OT Time Calculation (min): 33 min Charges:  OT General Charges $OT Visit: 1 Visit OT Evaluation $OT Eval Moderate Complexity: 1 Mod  Dalphine Handing, MSOT, OTR/L Acute Rehabilitation Services [facility] Office Number: [phone] Pager: [phone]  Dalphine Handing 03/26/2020, 6:27 PM

## 2020-03-26 NOTE — Progress Notes (Signed)
SLP Cancellation Note  Patient Details [name] MRN: [id number] DOB: [birth year]   Cancelled treatment:       Reason Eval/Treat Not Completed: Patient at procedure or test/unavailable    Mahala Menghini., M.A. CCC-SLP Acute Rehabilitation Services Pager [phone] Office [phone]  03/26/2020, 11:07 AM

## 2020-03-26 NOTE — Progress Notes (Signed)
    [facility] has been requested to perform a transesophageal echocardiogram on Meredith Shaw for stroke.  After careful review of history and examination, the risks and benefits of transesophageal echocardiogram have been explained including risks of esophageal damage, perforation (1:10,000 risk), bleeding, pharyngeal hematoma as well as other potential complications associated with conscious sedation including aspiration, arrhythmia, respiratory failure and death. Alternatives to treatment were discussed, questions were answered. Patient and family are willing to proceed.   Georgie Chard, NP  03/26/2020 8:29 AM

## 2020-03-26 NOTE — Progress Notes (Addendum)
Progress Note  Patient [name] Date of Encounter: 03/26/2020  Primary Cardiologist: New to [facility]- Dr. Tresa Endo  Subjective   Pt more alert today. Daughter at bedside. Ok to proceed with TEE today. All questions answered   Inpatient Medications    Scheduled Meds: . aspirin  300 mg Rectal Daily  . Chlorhexidine Gluconate Cloth  6 each Topical Daily  . docusate  100 mg Oral BID  . feeding supplement (PRO-STAT SUGAR FREE 64)  30 mL Per Tube BID  . insulin aspart  0-20 Units Subcutaneous Q4H  . mouth rinse  15 mL Mouth Rinse BID  . pantoprazole (PROTONIX) IV  40 mg Intravenous Q12H  . polyethylene glycol  17 g Oral Daily   Continuous Infusions: . sodium chloride 100 mL/hr at 03/26/20 0700  . feeding supplement (VITAL 1.5 CAL)    . meropenem (MERREM) IV Stopped (03/26/20 0140)   PRN Meds: acetaminophen **OR** acetaminophen (TYLENOL) oral liquid 160 mg/5 mL **OR** acetaminophen, metoprolol tartrate, ondansetron (ZOFRAN) IV, senna-docusate   Vital Signs    Vitals:   03/26/20 0530 03/26/20 0600 03/26/20 0630 03/26/20 0700  BP: (!) 148/73 (!) 148/77 (!) 144/89 (!) 157/93  Pulse: 81 80 (!) 103 100  Resp: 17 19 (!) 25 (!) 24  Temp:      TempSrc:      SpO2: 100% 99% 100% 99%  Weight:        Intake/Output Summary (Last 24 hours) at 03/26/2020 0744 Last data filed at 03/26/2020 0700 Gross per 24 hour  Intake 2834.87 ml  Output 1600 ml  Net 1234.87 ml   Filed Weights   03/23/20 2233 03/26/20 0437  Weight: 77 kg 82.4 kg    Physical Exam   General: Well developed, well nourished, NAD Neck: Negative for carotid bruits. No JVD Lungs:Clear to ausculation bilaterally. No wheezes, rales, or rhonchi. Breathing is unlabored. Cardiovascular: RRR with S1 S2. No murmurs Abdomen: Soft, non-tender, non-distended. No obvious abdominal masses. Extremities: No edema. Radial pulses 2+ bilaterally Neuro: Alert and oriented. No focal deficits. No facial asymmetry. MAE  spontaneously. Psych: Responds to questions appropriately with normal affect.    Labs    Chemistry Recent Labs  Lab 03/23/20 2227 03/23/20 2229 03/24/20 0450 03/24/20 0450 03/24/20 1527 03/24/20 1859 03/25/20 0055  NA 138   < > 136   < > 138 143 139  K 4.2   < > 4.0   < > 4.0 3.9 3.9  CL 104   < > 106  --  108  --  109  CO2 21*   < > 19*  --  17*  --  19*  GLUCOSE 316*   < > 308*  --  298*  --  248*  BUN 41*   < > 39*  --  41*  --  33*  CREATININE 1.70*   < > 1.54*  --  1.79*  --  1.70*  CALCIUM 9.3   < > 8.3*  --  8.6*  --  8.5*  PROT 6.9  --   --   --  6.4*  --  5.9*  ALBUMIN 3.4*  --   --   --  3.0*  --  2.6*  AST 15  --   --   --  21  --  20  ALT 15  --   --   --  15  --  13  ALKPHOS 54  --   --   --  52  --  45  BILITOT 0.9  --   --   --  0.4  --  0.6  GFRNONAA 29*   < > 33*  --  28*  --  29*  GFRAA 34*   < > 38*  --  32*  --  34*  ANIONGAP 13   < > 11  --  13  --  11   < > = values in this interval not displayed.     Hematology Recent Labs  Lab 03/24/20 0450 03/24/20 0450 03/24/20 1527 03/24/20 1859 03/25/20 0055  WBC 12.2*  --  18.6*  --  14.3*  RBC 3.96  --  4.03  --  3.74*  HGB 12.1   < > 12.0 10.5* 11.1*  HCT 34.8*   < > 35.3* 31.0* 32.8*  MCV 87.9  --  87.6  --  87.7  MCH 30.6  --  29.8  --  29.7  MCHC 34.8  --  34.0  --  33.8  RDW 13.5  --  13.6  --  13.8  PLT 224  --  216  --  187   < > = values in this interval not displayed.    Cardiac EnzymesNo results for input(s): TROPONINI in the last 168 hours. No results for input(s): TROPIPOC in the last 168 hours.   BNPNo results for input(s): BNP, PROBNP in the last 168 hours.   DDimer No results for input(s): DDIMER in the last 168 hours.   Radiology    DG Abd 1 View  Result Date: 03/24/2020 CLINICAL DATA:  Vomiting. EXAM: ABDOMEN - 1 VIEW COMPARISON:  Abdominal x-ray dated September 25, 2015. FINDINGS: The bowel gas pattern is normal. No radio-opaque calculi or other significant radiographic  abnormality are seen. Excreted contrast in the renal collecting systems, ureters, and bladder. Moderate right and mild left hydroureteronephrosis. No acute osseous abnormality. IMPRESSION: 1. No obstruction. 2. Moderate right and mild left hydroureteronephrosis. Electronically Signed   By: Titus Dubin M.D.   On: 03/24/2020 16:42   CT HEAD WO CONTRAST  Result Date: 03/24/2020 CLINICAL DATA:  Stroke follow-up. Encephalopathy. Right MCA thrombectomy 03/24/2020 EXAM: CT HEAD WITHOUT CONTRAST TECHNIQUE: Contiguous axial images were obtained from the base of the skull through the vertex without intravenous contrast. COMPARISON:  CT head 03/24/2020 at 1201 hours. CT head 03/24/2020 at 0224 hours. FINDINGS: Brain: Progressive edema right MCA territory consistent with acute infarction. This involves the insula, superior temporal lobe, and frontal parietal cortex. No associated hemorrhage or midline shift. Low-density in the anterior frontal lobes bilaterally is symmetric and is felt to be due to artifact from motion and beam hardening. Low-density in the left frontal white matter compatible with chronic ischemia. Ventricle size normal. Vascular: Hyperdensity in the left M2 branch in the sylvian fissure compatible with thrombosis. Negative for hyperdense M1 segment. Skull: No focal skeletal lesion. Sinuses/Orbits: Paranasal sinuses clear. Bilateral cataract extraction. Other: None IMPRESSION: Progressive low-density in the right MCA territory compatible with acute infarct. Sparing of the basal ganglia. No associated hemorrhage. Hyperdense right M2 segment sylvian fissure compatible with thrombosis. Electronically Signed   By: Franchot Gallo M.D.   On: 03/24/2020 21:59   CT HEAD WO CONTRAST  Result Date: 03/24/2020 CLINICAL DATA:  Stroke. Left arm weakness and facial droop. Right M1 occlusion yesterday with subsequent vascular intervention. EXAM: CT HEAD WITHOUT CONTRAST TECHNIQUE: Contiguous axial images were  obtained from the base of the skull through the vertex without intravenous  contrast. COMPARISON:  Head CT earlier same day FINDINGS: Brain: Developing low-density with loss of gray-white differentiation throughout the right middle cerebral artery territory affecting the insula, frontal operculum and right frontoparietal cortical and subcortical brain. Sparing of the basal ganglia. No evidence of hemorrhage or midline shift. Chronic small-vessel ischemic changes elsewhere affect the cerebral hemispheric white matter. Old right cerebellar infarction. No hydrocephalus. No extra-axial collection. Vascular: There is atherosclerotic calcification of the major vessels at the base of the brain. Skull: Negative Sinuses/Orbits: Clear/normal Other: None IMPRESSION: Changes of acute infarction now evident by CT affecting the right middle cerebral artery territory with sparing of the basal ganglia. No hemorrhage. No midline shift. Electronically Signed   By: Paulina Fusi M.D.   On: 03/24/2020 12:32   MR ANGIO HEAD WO CONTRAST  Result Date: 03/25/2020 CLINICAL DATA:  Follow-up examination for acute stroke. EXAM: MRA HEAD WITHOUT CONTRAST TECHNIQUE: Angiographic images of the Circle of Willis were obtained using MRA technique without intravenous contrast. COMPARISON:  Prior brain MRI from earlier the same day as well as recent CTA from 03/23/2020. FINDINGS: ANTERIOR CIRCULATION: Examination degraded by motion artifact, limiting assessment. Distal cervical segments of the internal carotid arteries are widely patent with symmetric antegrade flow. Petrous, cavernous, and supraclinoid ICAs patent without appreciable stenosis or other abnormality. A1 segments patent bilaterally. Normal anterior communicating artery complex. Anterior cerebral arteries remain widely patent to their distal aspects without stenosis. Right M1 segment patent proximally, but poorly visualized at its mid and distal aspect, and suspected to be reoccluded  since prior catheter directed intervention. Right MCA bifurcation not well seen. Few small attenuated branches remain patent within the distal right MCA distribution, relatively stable from initial CTA. Left M1 segment widely patent. Normal left MCA bifurcation. Distal left MCA branches well perfused. POSTERIOR CIRCULATION: Vertebral arteries patent to the vertebrobasilar junction without stenosis. Patent right PICA. Left PICA not seen. Basilar patent to its distal aspect without stenosis. Superior cerebral arteries grossly patent proximally. Left PCA supplied via the basilar. Predominant fetal type origin of the right PCA. PCAs remain patent to their distal aspects without stenosis. No appreciable intracranial aneurysm. IMPRESSION: 1. Technically limited exam due to motion artifact. 2. Probable reocclusion of the distal right M1 segment since recent catheter directed revascularization. Few attenuated small vessels remain patent within the right MCA distribution distally. Overall, appearance is similar as compared to initial presenting CTA. 3. No other intracranial large vessel occlusion or high-grade stenosis. Electronically Signed   By: Rise Mu M.D.   On: 03/25/2020 21:08   MR BRAIN WO CONTRAST  Result Date: 03/25/2020 CLINICAL DATA:  Follow-up examination for acute stroke. EXAM: MRI HEAD WITHOUT CONTRAST TECHNIQUE: Multiplanar, multiecho pulse sequences of the brain and surrounding structures were obtained without intravenous contrast. COMPARISON:  Prior head CT from 03/24/2013 as well as previous studies. FINDINGS: Brain: Examination moderately degraded by motion artifact. Generalized age-related cerebral atrophy. Patchy T2/FLAIR hyperintensity within the periventricular and deep white matter both cerebral hemispheres as well as the pons, most consistent with chronic microvascular ischemic disease. Few scatter remote lacunar infarcts noted about the bilateral basal ganglia and thalami. Small  remote right cerebellar infarct with associated chronic hemosiderin staining noted. Large confluent area of restricted diffusion seen involving the majority of the right MCA distribution, consistent with large acute right MCA territory infarct. Associated gyral swelling and edema throughout the area of infarction. Regional mass effect without significant right-to-left midline shift. Basilar cisterns remain widely patent. Associated petechial hemorrhage throughout the  area of infarction without frank hemorrhagic transformation. Additional scattered patchy small volume foci of acute ischemia seen involving the right PCA territory, with involvement of the splenium, right thalamus, and right temporal occipital region. This is likely based on the predominant fetal type origin right PCA seen on corresponding CTA and MRA. Additional punctate focus of acute ischemia seen at the posterior left frontal periventricular white matter (series 2, image 25). No mass lesion or hydrocephalus. No extra-axial fluid collection. Pituitary gland suprasellar region within normal limits. Midline structures intact. Vascular: Loss of normal flow void within the distal right M1 segment extending into right M2 branches at the sylvian fissure. Associated susceptibility artifact seen on SWI sequence (series 7, image 33), consistent with thrombus. Finding consistent with reocclusion of previously revascularized right MCA, also seen on corresponding MRA. Major intracranial vascular flow voids otherwise maintained. Skull and upper cervical spine: Craniocervical junction within normal limits. Bone marrow signal intensity normal. No scalp soft tissue abnormality. Sinuses/Orbits: Patient status post bilateral ocular lens replacement. Globes and orbital soft tissues demonstrate no acute finding. Paranasal sinuses are largely clear. No significant mastoid effusion. Other: None. IMPRESSION: 1. 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 Associated petechial hemorrhage without frank hemorrhagic transformation. 2. Additional scattered small volume acute ischemic nonhemorrhagic right PCA territory infarcts, likely based on the fetal type origin right PCA as seen on corresponding MRA and CTA. 3. Additional punctate focus of acute ischemia involving the posterior left frontal periventricular white matter. 4. Loss of normal flow void with susceptibility artifact involving distal right M1/M2 branch, consistent with reocclusion. Finding consistent with findings seen on corresponding MRA. 5. Underlying age-related cerebral atrophy with chronic microvascular ischemic disease, with a few scattered remote lacunar infarcts involving the bilateral basal ganglia and thalami. Electronically Signed   By: Rise Mu M.D.   On: 03/25/2020 21:28   US RENAL  Result Date: 03/24/2020 CLINICAL DATA:  Acute kidney injury.  Elevated BUN and creatinine. EXAM: RENAL / URINARY TRACT ULTRASOUND COMPLETE COMPARISON:  None. FINDINGS: Right Kidney: Renal measurements: 9.7 x 4.7 x 4.2 cm. = volume: 97 mL. Normal echogenicity. Upper pole exophytic cyst measures 3.5 x 3.2 x 3.6 cm. No hydronephrosis Left Kidney: Renal measurements: 9.7 x 5.5 x 4.8 cm = volume: 132.8 mL. Normal echogenicity. No mass or hydronephrosis. Inferior pole cyst measures 1.8 x 1.7 x 1.6 cm. Bladder: Collapsed urinary bladder is not visualized. Other: None. IMPRESSION: 1. No acute abnormality. 2. Asymmetric right atrophy. 3. Bilateral kidney cysts. Electronically Signed   By: Signa Kell M.D.   On: 03/24/2020 22:54   DG CHEST PORT 1 VIEW  Result Date: 03/25/2020 CLINICAL DATA:  Fever and vomiting.  Concern for aspiration. EXAM: PORTABLE CHEST 1 VIEW COMPARISON:  Chest x-ray from yesterday. FINDINGS: The heart size and mediastinal contours are within normal limits. Normal pulmonary vascularity. Persistent low lung volumes and chronic elevation of the right hemidiaphragm with unchanged  mild right basilar atelectasis. No focal consolidation, pleural effusion, or pneumothorax. No acute osseous abnormality. IMPRESSION: 1. Persistent low lung volumes and mild right basilar atelectasis. No active disease. Electronically Signed   By: Obie Dredge M.D.   On: 03/25/2020 10:05   DG CHEST PORT 1 VIEW  Result Date: 03/24/2020 CLINICAL DATA:  Stroke.  Aspiration. EXAM: PORTABLE CHEST 1 VIEW COMPARISON:  Earlier same day FINDINGS: Endotracheal tube is been removed. Heart size remains normal. No pulmonary edema. The patient has not taken a deep inspiration. There may be mild  atelectasis at the right lung base but there is no dense consolidation or lobar collapse. No effusion. Surgical clips again noted at the right thoracic inlet. IMPRESSION: Extubated. Mild atelectasis at the right lung base. No dense consolidation or lobar collapse. Electronically Signed   By: Paulina Fusi M.D.   On: 03/24/2020 13:07   EEG adult  Result Date: 03/25/2020 Charlsie Quest, MD     03/25/2020  1:44 PM Patient [name] MRN: [id number] Epilepsy Attending: Charlsie Quest Referring Physician/Provider: Dr. Marvel Plan Date: 03/25/2020 Duration: 25.29 minutes Patient history: 74 year old female presenting with slurred speech, left-sided weakness and ataxia.  MRI brain showed right MCA infarct.  EEG evaluate for seizures. Level of alertness: Awake, asleep AEDs during EEG study: None Technical aspects: This EEG study was done with scalp electrodes positioned according to the 10-20 International system of electrode placement. Electrical activity was acquired at a sampling rate of 500Hz  and reviewed with a high frequency filter of 70Hz  and a low frequency filter of 1Hz . EEG data were recorded continuously and digitally stored. Description: During awake state, no clear posterior dominant rhythm was seen.  Sleep was characterized by vertex, maximal frontocentral region.  EEG showed continuous generalized and  lateralized right hemisphere 3 to 6 Hz theta-delta slowing.  Sharp waves were also seen in right frontal region which appeared rhythmic without any clear evolution when patient was stimulated at the end of the study.  Hyperventilation and photic stimulation were not performed.   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  The sharp waves appeared rhythmic when patient was stimulated without any clear evolution and is more likely related to arousal.  No definite seizures were seen during the study.   ECHOCARDIOGRAM COMPLETE  Result Date: 03/24/2020    ECHOCARDIOGRAM REPORT   Patient Name:   Meredith Shaw Date of Exam: 03/24/2020 Medical Rec #:  03/26/2020        Height:       67.0 in Accession #:    [name]       Weight:       169.8 lb Date of Birth:  12-10-1945        BSA:          1.886 m Patient Age:    73 years         BP:           115/66 mmHg Patient Gender: F                HR:           85 bpm. Exam Location:  Inpatient Procedure: 2D Echo, Cardiac Doppler, Color Doppler and Intracardiac            Opacification Agent Indications:    Stroke 434.91 / I163.9  History:        Patient has no prior history of Echocardiogram examinations.                 Risk Factors:Hypertension and Diabetes. Hypothyrodism.  Sonographer:    Tiffany Dance Referring Phys: [id number] R AROOR  Sonographer Comments: Echo performed with patient supine and on artificial respirator. IMPRESSIONS  1. Left ventricular ejection fraction, by estimation, is 70 to 75%. The left ventricle has hyperdynamic function with intracavitary gradient  up to 32 mmHg. The left ventricle has no regional wall motion abnormalities. There is mild left ventricular hypertrophy. Left  ventricular diastolic parameters are indeterminate.  2. Right ventricular systolic function is normal. The right ventricular size is normal. Tricuspid regurgitation signal is inadequate for assessing PA pressure.  3. The aortic valve is tricuspid. Aortic valve regurgitation is not visualized. Mild aortic valve sclerosis is present, with no evidence of aortic valve stenosis.  4. Aortic dilatation noted. 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  5. The inferior vena cava is normal in size with greater than 50% respiratory variability, suggesting right atrial pressure of 3 mmHg.  6. Appears to be echodensity attached to left atrial side of posterior leaflet of mitral valve (best seen on image 3), but notably only seen on parasternal long axis images. Recomend further evaluation with with TEE to assess for cardioembolic source of  CVA. The mitral valve is degenerative. Trivial mitral valve regurgitation. FINDINGS  Left Ventricle: Hyperdynamic LV function with intracavitary gradient 32 mmHg. Left ventricular ejection fraction, by estimation, is 70 to 75%. The left ventricle has hyperdynamic function. The left ventricle has no regional wall motion abnormalities. Definity contrast agent was given IV to delineate the left ventricular endocardial borders. The left ventricular internal cavity size was normal in size. There is mild left ventricular hypertrophy. Left ventricular diastolic parameters are indeterminate. Right Ventricle: The right ventricular size is normal. Right vetricular wall thickness was not assessed. Right ventricular systolic function is normal. Tricuspid regurgitation signal is inadequate for assessing PA pressure. Left Atrium: Left atrial size was normal in size. Right Atrium: Right atrial size was normal in size. Pericardium: There is no evidence of pericardial effusion. Mitral Valve: Possible echodensity attached to left atrial side of posterior leaflet of mitral valve (best  seen on image 3), but notably only seen on parasternal long axis images. Recomend further evaluation with with TEE to rule out cardioembolic source  of CVA. The mitral valve is degenerative in appearance. Trivial mitral valve regurgitation. Tricuspid Valve: The tricuspid valve is normal in structure. Tricuspid valve regurgitation is not demonstrated. Aortic Valve: The aortic valve is tricuspid. Aortic valve regurgitation is not visualized. Mild aortic valve sclerosis is present, with no evidence of aortic valve stenosis. Pulmonic Valve: The pulmonic valve was not well visualized. Pulmonic valve regurgitation is not visualized. Aorta: Aortic dilatation noted. 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 Venous: The inferior vena cava is normal in size with greater than 50% respiratory variability, suggesting right atrial pressure of 3 mmHg. IAS/Shunts: The interatrial septum was not well visualized.  LEFT VENTRICLE PLAX 2D LVIDd:         4.00 cm LVIDs:         2.90 cm LV PW:         1.10 cm LV IVS:        1.00 cm LVOT diam:     1.80 cm LV SV:         86 LV SV Index:   46 LVOT Area:     2.54 cm  RIGHT VENTRICLE             IVC RV Basal diam:  2.10 cm     IVC diam: 1.60 cm RV S prime:     19.60 cm/s TAPSE (M-mode): 1.7 cm LEFT ATRIUM             Index       RIGHT  ATRIUM          Index LA diam:        3.40 cm 1.80 cm/m  RA Area:     7.76 cm LA Vol (A2C):   42.5 ml 22.53 ml/m RA Volume:   12.30 ml 6.52 ml/m LA Vol (A4C):   29.3 ml 15.53 ml/m LA Biplane Vol: 37.4 ml 19.83 ml/m  AORTIC VALVE LVOT Vmax:   176.00 cm/s LVOT Vmean:  123.000 cm/s LVOT VTI:    0.338 m  AORTA Ao Root diam: 3.10 cm Ao Asc diam:  3.60 cm MITRAL VALVE MV Area (PHT): 2.60 cm     SHUNTS MV Decel Time: 292 msec     Systemic VTI:  0.34 m MV E velocity: 62.80 cm/s   Systemic Diam: 1.80 cm MV A velocity: 104.00 cm/s MV E/A ratio:  0.60 Epifanio Lescheshristopher Schumann MD Electronically signed by Epifanio Lescheshristopher Schumann MD Signature Date/Time:  03/24/2020/10:42:15 AM    Final    VAS US LOWER EXTREMITY VENOUS (DVT)  Result Date: 03/24/2020  Lower Venous DVTStudy Indications: Stroke.  Limitations: Patient positioning. Comparison Study: 05/26/18 previous Performing Technologist: Blanch MediaMegan Riddle RVS  Examination Guidelines: A complete evaluation includes B-mode imaging, spectral Doppler, color Doppler, and power Doppler as needed of all accessible portions of each vessel. Bilateral testing is considered an integral part of a complete examination. Limited examinations for reoccurring indications may be performed as noted. The reflux portion of the exam is performed with the patient in reverse Trendelenburg.  +---------+---------------+---------+-----------+----------+--------------+ RIGHT    CompressibilityPhasicitySpontaneityPropertiesThrombus Aging +---------+---------------+---------+-----------+----------+--------------+ CFV      Full           Yes      Yes                                 +---------+---------------+---------+-----------+----------+--------------+ SFJ      Full                                                        +---------+---------------+---------+-----------+----------+--------------+ FV Prox  Full                                                        +---------+---------------+---------+-----------+----------+--------------+ FV Mid   Full                                                        +---------+---------------+---------+-----------+----------+--------------+ FV DistalFull                                                        +---------+---------------+---------+-----------+----------+--------------+ PFV      Full                                                        +---------+---------------+---------+-----------+----------+--------------+  POP      Full           Yes      Yes                                  +---------+---------------+---------+-----------+----------+--------------+ PTV      Full                                                        +---------+---------------+---------+-----------+----------+--------------+ PERO     Full                                                        +---------+---------------+---------+-----------+----------+--------------+   +---------+---------------+---------+-----------+----------+--------------+ LEFT     CompressibilityPhasicitySpontaneityPropertiesThrombus Aging +---------+---------------+---------+-----------+----------+--------------+ CFV      Full           Yes      Yes                                 +---------+---------------+---------+-----------+----------+--------------+ SFJ      Full                                                        +---------+---------------+---------+-----------+----------+--------------+ FV Prox  Full                                                        +---------+---------------+---------+-----------+----------+--------------+ FV Mid   Full                                                        +---------+---------------+---------+-----------+----------+--------------+ FV Distal               Yes      Yes                                 +---------+---------------+---------+-----------+----------+--------------+ PFV      Full                                                        +---------+---------------+---------+-----------+----------+--------------+ POP      Full           Yes      Yes                                 +---------+---------------+---------+-----------+----------+--------------+  PTV      Full                                                        +---------+---------------+---------+-----------+----------+--------------+ PERO                                                  Not visualized  +---------+---------------+---------+-----------+----------+--------------+     Summary: BILATERAL: - No evidence of deep vein thrombosis seen in the lower extremities, bilaterally. -   *See table(s) above for measurements and observations. Electronically signed by Gretta Began MD on 03/24/2020 at 5:33:26 PM.    Final    Telemetry    03/26/20 NSR - Personally Reviewed  ECG    No new tracings as of 03/26/20 - Personally Reviewed  Cardiac Studies   Echocardiogram 03/24/20:  1. Left ventricular ejection fraction, by estimation, is 70 to 75%. The  left ventricle has hyperdynamic function with intracavitary gradient up to  32 mmHg. The left ventricle has no regional wall motion abnormalities.  There is mild left ventricular  hypertrophy. Left ventricular diastolic parameters are indeterminate.  2. Right ventricular systolic function is normal. The right ventricular  size is normal. Tricuspid regurgitation signal is inadequate for assessing  PA pressure.  3. The aortic valve is tricuspid. Aortic valve regurgitation is not  visualized. Mild aortic valve sclerosis is present, with no evidence of  aortic valve stenosis.  4. Aortic dilatation noted. 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  5. The inferior vena cava is normal in size with greater than 50%  respiratory variability, suggesting right atrial pressure of 3 mmHg.  6. Appears to be echodensity attached to left atrial side of posterior  leaflet of mitral valve (best seen on image 3), but notably only seen on  parasternal long axis images. Recomend further evaluation with with TEE to  assess for cardioembolic source of  CVA. The mitral valve is degenerative. Trivial mitral valve  regurgitation.   Patient Profile     74 y.o. female with a hx of DM2, HTN, and hypothyroidism who is being seen today for the evaluation of mitral valve vegetation at the request of Dr. Roda Shutters.  Assessment & Plan    1. MV  vegetation: -Pt presented to [facility] 03/23/20 with acute onset of left sided weakness and slurred speech. Code Stroke called by ED physician. HPI obtained from chart reviewed given patients acute status. She was last seen normal approximately 8:50pm on day of presentation when she had sudden acute onset of slurred speech and left sided weakness.  -CT was obtained which showed no acute findings>>TPA was administered after consent. Unfortunately her NIHSS worsened. At that time CT angiogram confirmed right distal M1 occlusion. She was then taken for mechanical thrombectomy per IR. Pt was intubated for procedure and has failed to meet extubation criteria and remains intubated at this time.  -Plan per neurology was to obtain MRI/MRA which is pending. LE doppler studies obtained which were negative. -Echocardiogram was performed 03/24/20 which showed echodensity attached to left atrial side of posterior leaflet of mitral valve (best seen on image 3), but notably only seen on parasternal  long axis images. Recomend further evaluation with with TEE to assess for cardioembolic source of CVA. The mitral valve is degenerative with trivial mitral valve regurgitation.  -TEE scheduled for today>>will need to contact next of kin for consent. -Consider ID consult for ? endocarditits  2. 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  -Management per neurology  3. Acute respiratory failure: -Intubated for IR procedure>>currenlty extubated and maintaining adequate saturations  -Management per PCCM/neurology  4. HTN: -Stable, 157/93>144/89>148/77 -PTA amlodipine, lisinopril, metoprolol  -Currently Cleviprex  5. HLD: -Triglycerides found to be  markedly elevated, >500 -LDL, 63.8 with goal <70 -Plan to add statin once able to take PO  -Consider Vescepa for hypertrig  6. DM2: -HbA1c, 8.9  -SSI while inpatient status -PTA trulicity, glimepiride, metformin, ozempic    Signed, Georgie Chard NP-C [facility] Pager: [phone] 03/26/2020, 7:44 AM      Patient seen and examined. Agree with assessment and plan.  TEE performed today by Dr. Delton See.  This revealed hyperdynamic LV function with EF at 70 to 75%.  There was mild mitral regurgitation without evidence for vegetation or masses.  Specifically, there was no mass or vegetation identified on the mitral valve or other valves.  Mild dilation of the left atrium.  There was no thrombus in the left atrial appendage.  A bubble study was negative.  Wall motion was normal.  There was mild concentric LVH.  There is evidence for mild aortic regurgitation without aortic stenosis.  There is mild grade 2 plaque.  Based on the echocardiographic findings, doubt cardiac etiology for thromboembolic stroke.  Maintaining sinus rhythm. Pt more alert. Now post procedure sitting up with assistance with physical therapy. Left side neglect; not moving R arm and minimal left leg.  Will sign off.   Lennette Bihari, MD, [facility] 03/26/2020 3:46 PM    For questions or updates, please contact   Please consult [email or web address] for contact info under Cardiology/STEMI.

## 2020-03-26 NOTE — Transfer of Care (Signed)
Immediate Anesthesia Transfer of Care Note  Patient: Meredith Shaw  Procedure(s) Performed: TRANSESOPHAGEAL ECHOCARDIOGRAM (TEE) (N/A ) BUBBLE STUDY  Patient Location: Endoscopy Unit  Anesthesia Type:MAC  Level of Consciousness: drowsy  Airway & Oxygen Therapy: Patient Spontanous Breathing and Patient connected to nasal cannula oxygen  Post-op Assessment: Report given to RN, Post -op Vital signs reviewed and stable and Patient moving all extremities  Post vital signs: Reviewed and stable  Last Vitals:  Vitals Value Taken Time  BP 159/95 03/26/20 1355  Temp    Pulse 211 03/26/20 1355  Resp 23 03/26/20 1356  SpO2 100 % 03/26/20 1355  Vitals shown include unvalidated device data.  Last Pain:  Vitals:   03/26/20 1110  TempSrc:   PainSc: 0-No pain         Complications: No complications documented.

## 2020-03-26 NOTE — Procedures (Signed)
Cortrak  Person Inserting Tube:  Kayal Mula C, RD Tube Type:  Cortrak - 43 inches Tube Location:  Left nare Initial Placement:  Stomach Secured by: Bridle Technique Used to Measure Tube Placement:  Documented cm marking at nare/ corner of mouth Cortrak Secured At:  62 cm    Cortrak Tube Team Note:  Consult received to place a Cortrak feeding tube.   No x-ray is required. RN may begin using tube.   If the tube becomes dislodged please keep the tube and contact the Cortrak team at [email or web address] (password [email or web address]) for replacement.  If after hours and replacement cannot be delayed, place a NG tube and confirm placement with an abdominal x-ray.    Tiron Suski P., RD, LDN, CNSC See AMiON for contact information    

## 2020-03-26 NOTE — Evaluation (Signed)
Physical Therapy Evaluation Patient Details [name] MRN: [id number] DOB: [birth year] Today's Date: 03/26/2020   History of Present Illness  74 y.o female admitted with sudden onset Lt sided weakness and surred speech.  Tpa was administered.  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 pt extubated 6/14, but had increasing lethsrgy > possibly septic due to UTI.  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  PMH includes: hypertensive retinopathy, DM, arthritis, s/p TKA, s/p ORIF Rt hip, s/p ORIF LT hip.  Clinical Impression  Pt presents to PT with deficits in functional mobility, gait, balance, endurance, strength, power, motor control, coordination. Pt currently requires physical assistance to perform all functional mobility with significant L weakness and with some pushing to left with R side in sitting. Pt with R gaze preference, able to track to midline and does cross midline with use of RUE when combing hair during session. Pt is able to identify her own hand with cueing but has no sensation to light touch in left side. Pt will continue to benefit from acute PT POC to improve mobility quality and to reduce falls risk. PT recommends the patient receive high intensity inpatient PT services at the time of discharge to aide in a return to independent mobility.    Follow Up Recommendations CIR    Equipment Recommendations  Hospital bed (mechanical lift if D/C home today)    Recommendations for Other Services Rehab consult     Precautions / Restrictions Precautions Precautions: Fall Restrictions Weight Bearing Restrictions: No      Mobility  Bed Mobility Overal bed mobility: Needs Assistance Bed Mobility: Supine to Sit;Sit to Supine     Supine to sit: Max assist;HOB elevated;+2 for physical assistance Sit to supine: Max assist;+2 for physical assistance;HOB  elevated      Transfers Overall transfer level: Needs assistance Equipment used: 1 person hand held assist;2 person hand held assist Transfers: Sit to/from Stand Sit to Stand: Max assist;+2 physical assistance;From elevated surface         General transfer comment: pt performs 1 sit to stand with maxA and L knee block, 2nd sit to stand with maxA x2  Ambulation/Gait                Stairs            Wheelchair Mobility    Modified Rankin (Stroke Patients Only) Modified Rankin (Stroke Patients Only) Pre-Morbid Rankin Score: No symptoms Modified Rankin: Severe disability     Balance Overall balance assessment: Needs assistance Sitting-balance support: Bilateral upper extremity supported;Feet supported Sitting balance-Leahy Scale: Zero Sitting balance - Comments: mod-maxA at times due to L lateral and posterior lean, pt pushing mildly to left with RUE Postural control: Left lateral lean;Posterior lean Standing balance support: Bilateral upper extremity supported Standing balance-Leahy Scale: Zero Standing balance comment: max-totalA to maintain static standing                             Pertinent Vitals/Pain Pain Assessment: No/denies pain    Home Living Family/patient expects to be discharged to:: Private residence Living Arrangements: Spouse/significant other Available Help at Discharge: Family;Available 24 hours/day Type of Home: House Home Access: Stairs to enter Entrance Stairs-Rails: None Entrance Stairs-Number of Steps: 2 Home Layout: One level Home Equipment: Walker - 2 wheels;Bedside commode;Shower seat;Cane - single point;Hand held shower head;Grab bars -  toilet;Grab bars - tub/shower;Wheelchair - manual Additional Comments: DME info obtained from prior admission, needs to be confirmed    Prior Function Level of Independence: Needs assistance   Gait / Transfers Assistance Needed: pt reports ambulating without assistive  device  ADL's / Homemaking Assistance Needed: pt requires assistance to don socks        Hand Dominance   Dominant Hand: Right    Extremity/Trunk Assessment   Upper Extremity Assessment Upper Extremity Assessment: Defer to OT evaluation    Lower Extremity Assessment Lower Extremity Assessment: RLE deficits/detail;LLE deficits/detail RLE Deficits / Details: grossly 4-/5, PT does note tremoring of RLE at rest, intermittent rhythmic tapping of R side pt reports is unintentional RLE Sensation: WNL LLE Deficits / Details: pt with 3-/5 R knee and hip flexion, other motion appears non-purposeful and automatic, limited AROM noted. Pt with some increase in adductor tone, PROM WFL LLE Sensation: decreased light touch (no response to pain)    Cervical / Trunk Assessment Cervical / Trunk Assessment: Kyphotic  Communication   Communication: Other (comment) (slowed speech and communication)  Cognition Arousal/Alertness: Awake/alert Behavior During Therapy: WFL for tasks assessed/performed Overall Cognitive Status: Impaired/Different from baseline Area of Impairment: Attention;Memory;Following commands;Safety/judgement;Awareness;Problem solving                   Current Attention Level: Sustained Memory: Decreased recall of precautions;Decreased short-term memory Following Commands: Follows one step commands consistently;Follows one step commands with increased time Safety/Judgement: Decreased awareness of safety;Decreased awareness of deficits Awareness: Intellectual Problem Solving: Slow processing;Requires verbal cues        General Comments General comments (skin integrity, edema, etc.): VSS on 1L [place], PT weans pt to room air with mobility and stable sats, 1L [place] reapplied at end of session    Exercises     Assessment/Plan    PT Assessment Patient needs continued PT services  PT Problem List Decreased strength;Decreased activity tolerance;Decreased balance;Decreased  mobility;Decreased coordination;Decreased cognition;Decreased knowledge of use of DME;Decreased knowledge of precautions;Decreased safety awareness;Cardiopulmonary status limiting activity;Impaired sensation;Impaired tone       PT Treatment Interventions DME instruction;Gait training;Stair training;Functional mobility training;Therapeutic activities;Balance training;Therapeutic exercise;Neuromuscular re-education;Cognitive remediation;Patient/family education;Wheelchair mobility training    PT Goals (Current goals can be found in the Care Plan section)  Acute Rehab PT Goals Patient Stated Goal: To improve mobility PT Goal Formulation: With patient Time For Goal Achievement: 04/09/20 Potential to Achieve Goals: Good    Frequency Min 4X/week   Barriers to discharge        Co-evaluation               AM-PAC PT "6 Clicks" Mobility  Outcome Measure Help needed turning from your back to your side while in a flat bed without using bedrails?: Total Help needed moving from lying on your back to sitting on the side of a flat bed without using bedrails?: Total Help needed moving to and from a bed to a chair (including a wheelchair)?: Total Help needed standing up from a chair using your arms (e.g., wheelchair or bedside chair)?: Total Help needed to walk in hospital room?: Total Help needed climbing 3-5 steps with a railing? : Total 6 Click Score: 6    End of Session Equipment Utilized During Treatment: Oxygen Activity Tolerance: Patient tolerated treatment well Patient left: in bed;with call bell/phone within reach;with bed alarm set Nurse Communication: Mobility status;Need for lift equipment PT Visit Diagnosis: Other abnormalities of gait and mobility (R26.89);Muscle weakness (generalized) (M62.81);History of falling (Z91.81);Other symptoms  and signs involving the nervous system (R29.898)    Time: 6384-6659 PT Time Calculation (min) (ACUTE ONLY): 27 min   Charges:   PT  Evaluation $PT Eval Moderate Complexity: 1 Mod          Zenaida Niece, PT, DPT Acute Rehabilitation Pager: [phone]   Zenaida Niece 03/26/2020, 4:55 PM

## 2020-03-26 NOTE — Progress Notes (Signed)
Brief Progress Note  Pt is back from TEE and resting comfortably. Awake and alert, with mental status improved from yesterday.   Mitral Valve echogenicity on echo TEE showing LVEF 70-75% with hyperdynamic function. Mildly dilated L atrium. No valvular disease detected. No evidence of mitral valve vegetation, normal mobility of leaflets, and mild valve regurgitation.  - Will defer to Cardiology for further workup   Acute Respiratory Failure - Continue to wean O2 requirement. Currently on 2L   [place] MS4

## 2020-03-27 DIAGNOSIS — I63411 Cerebral infarction due to embolism of right middle cerebral artery: Principal | ICD-10-CM

## 2020-03-27 DIAGNOSIS — J9601 Acute respiratory failure with hypoxia: Secondary | ICD-10-CM

## 2020-03-27 DIAGNOSIS — E876 Hypokalemia: Secondary | ICD-10-CM

## 2020-03-27 DIAGNOSIS — I63511 Cerebral infarction due to unspecified occlusion or stenosis of right middle cerebral artery: Secondary | ICD-10-CM

## 2020-03-27 DIAGNOSIS — N179 Acute kidney failure, unspecified: Secondary | ICD-10-CM | POA: Diagnosis not present

## 2020-03-27 LAB — TRIGLYCERIDES: Triglycerides: 247 mg/dL — ABNORMAL HIGH (ref <150)

## 2020-03-27 LAB — CBC
HCT: 31.3 % — ABNORMAL LOW (ref 36.0–46.0)
Hemoglobin: 10.4 g/dL — ABNORMAL LOW (ref 12.0–15.0)
MCH: 29.4 pg (ref 26.0–34.0)
MCHC: 33.2 g/dL (ref 30.0–36.0)
MCV: 88.4 fL (ref 80.0–100.0)
Platelets: 207 10*3/uL (ref 150–400)
RBC: 3.54 MIL/uL — ABNORMAL LOW (ref 3.87–5.11)
RDW: 13.5 % (ref 11.5–15.5)
WBC: 9.2 10*3/uL (ref 4.0–10.5)
nRBC: 0 % (ref 0.0–0.2)

## 2020-03-27 LAB — COMPREHENSIVE METABOLIC PANEL
ALT: 14 U/L (ref 0–44)
AST: 27 U/L (ref 15–41)
Albumin: 2.3 g/dL — ABNORMAL LOW (ref 3.5–5.0)
Alkaline Phosphatase: 58 U/L (ref 38–126)
Anion gap: 9 (ref 5–15)
BUN: 24 mg/dL — ABNORMAL HIGH (ref 8–23)
CO2: 21 mmol/L — ABNORMAL LOW (ref 22–32)
Calcium: 7.9 mg/dL — ABNORMAL LOW (ref 8.9–10.3)
Chloride: 110 mmol/L (ref 98–111)
Creatinine, Ser: 1.23 mg/dL — ABNORMAL HIGH (ref 0.44–1.00)
GFR calc Af Amer: 50 mL/min — ABNORMAL LOW (ref >60)
GFR calc non Af Amer: 43 mL/min — ABNORMAL LOW (ref >60)
Glucose, Bld: 238 mg/dL — ABNORMAL HIGH (ref 70–99)
Potassium: 3.4 mmol/L — ABNORMAL LOW (ref 3.5–5.1)
Sodium: 140 mmol/L (ref 135–145)
Total Bilirubin: 0.7 mg/dL (ref 0.3–1.2)
Total Protein: 5.7 g/dL — ABNORMAL LOW (ref 6.5–8.1)

## 2020-03-27 LAB — MAGNESIUM: Magnesium: 2.1 mg/dL (ref 1.7–2.4)

## 2020-03-27 LAB — GLUCOSE, CAPILLARY
Glucose-Capillary: 223 mg/dL — ABNORMAL HIGH (ref 70–99)
Glucose-Capillary: 238 mg/dL — ABNORMAL HIGH (ref 70–99)
Glucose-Capillary: 277 mg/dL — ABNORMAL HIGH (ref 70–99)
Glucose-Capillary: 286 mg/dL — ABNORMAL HIGH (ref 70–99)
Glucose-Capillary: 289 mg/dL — ABNORMAL HIGH (ref 70–99)
Glucose-Capillary: 318 mg/dL — ABNORMAL HIGH (ref 70–99)

## 2020-03-27 LAB — PHOSPHORUS: Phosphorus: 2.2 mg/dL — ABNORMAL LOW (ref 2.5–4.6)

## 2020-03-27 MED ORDER — CEPHALEXIN 500 MG PO CAPS
500.0000 mg | ORAL_CAPSULE | Freq: Two times a day (BID) | ORAL | Status: DC
  Administered 2020-03-27 – 2020-03-31 (×9): 500 mg
  Filled 2020-03-27 (×18): qty 1

## 2020-03-27 MED ORDER — INSULIN DETEMIR 100 UNIT/ML ~~LOC~~ SOLN
10.0000 [IU] | Freq: Every day | SUBCUTANEOUS | Status: DC
  Administered 2020-03-27 – 2020-03-28 (×2): 10 [IU] via SUBCUTANEOUS
  Filled 2020-03-27 (×3): qty 0.1

## 2020-03-27 MED ORDER — POTASSIUM CHLORIDE 20 MEQ/15ML (10%) PO SOLN
40.0000 meq | ORAL | Status: AC
  Administered 2020-03-27 (×2): 40 meq via ORAL
  Filled 2020-03-27 (×2): qty 30

## 2020-03-27 MED ORDER — CEPHALEXIN 500 MG PO CAPS: 500.0000 mg | ORAL_CAPSULE | Freq: Four times a day (QID) | ORAL | Status: DC

## 2020-03-27 MED ORDER — EZETIMIBE 10 MG PO TABS
10.0000 mg | ORAL_TABLET | Freq: Every day | ORAL | Status: DC
  Administered 2020-03-27 – 2020-04-05 (×10): 10 mg
  Filled 2020-03-27 (×10): qty 1

## 2020-03-27 MED ORDER — POTASSIUM PHOSPHATES 15 MMOLE/5ML IV SOLN
10.0000 mmol | Freq: Once | INTRAVENOUS | Status: AC
  Administered 2020-03-27: 10 mmol via INTRAVENOUS
  Filled 2020-03-27: qty 3.33

## 2020-03-27 MED ORDER — SENNOSIDES-DOCUSATE SODIUM 8.6-50 MG PO TABS
1.0000 | ORAL_TABLET | Freq: Every evening | ORAL | Status: DC | PRN
  Filled 2020-03-27: qty 1

## 2020-03-27 NOTE — Progress Notes (Signed)
STROKE TEAM PROGRESS NOTE   INTERVAL HISTORY RN at bedside. Pt more awake alert today and eyes open, following commands, still lethargic and left sided hemiparesis, arm more than leg. On TF now. TEE yesterday unremarkable.    Vitals:   03/27/20 0700 03/27/20 0800 03/27/20 0900 03/27/20 1000  BP: 114/90 (!) 125/110 (!) 143/116 (!) 151/94  Pulse: 90 93 (!) 132 86  Resp: (!) 24 (!) 22 15 20   Temp:  99.1 F (37.3 C)    TempSrc:  Axillary    SpO2: 96% 97% 99% 97%  Weight:       CBC:  Recent Labs  Lab 03/23/20 2227 03/23/20 2229 03/24/20 0450 03/24/20 1527 03/26/20 0727 03/27/20 0707  WBC 9.4   < > 12.2*   < > 11.6* 9.2  NEUTROABS 6.5  --  10.0*  --   --   --   HGB 13.4   < > 12.1   < > 10.2* 10.4*  HCT 40.8   < > 34.8*   < > 30.7* 31.3*  MCV 89.7   < > 87.9   < > 89.0 88.4  PLT 222   < > 224   < > 190 207   < > = values in this interval not displayed.   Basic Metabolic Panel:  Recent Labs  Lab 03/26/20 0727 03/27/20 0707  NA 141 140  K 3.5 3.4*  CL 111 110  CO2 19* 21*  GLUCOSE 138* 238*  BUN 20 24*  CREATININE 1.37* 1.23*  CALCIUM 7.7* 7.9*  MG 1.0* 2.1  PHOS  --  2.2*   Lipid Panel:     Component Value Date/Time   CHOL 156 03/24/2020 0450   CHOL 163 11/15/2019 0859   TRIG 247 (H) 03/27/2020 0707   TRIG 456 (H) 07/03/2013 1504   HDL 27 (L) 03/24/2020 0450   HDL 33 (L) 11/15/2019 0859   HDL 36 (L) 07/03/2013 1504   CHOLHDL 5.8 03/24/2020 0450   VLDL UNABLE TO CALCULATE IF TRIGLYCERIDE OVER 400 mg/dL 03/26/2020 63/33/5456   LDLCALC UNABLE TO CALCULATE IF TRIGLYCERIDE OVER 400 mg/dL 2563 89/37/3428   LDLCALC 83 11/15/2019 0859   LDLCALC NOTES: 07/03/2013 1504   HgbA1c:  Lab Results  Component Value Date   HGBA1C 8.9 (H) 03/24/2020   Urine Drug Screen: No results found for: LABOPIA, COCAINSCRNUR, LABBENZ, AMPHETMU, THCU, LABBARB  Alcohol Level No results found for: [facility]  IMAGING past 24 hours ECHO TEE  Result Date: 03/26/2020    TRANSESOPHOGEAL ECHO REPORT    Patient Name:   BRENAE LASECKI Date of Exam: 03/26/2020 Medical Rec #:  03/28/2020        Height:       67.0 in Accession #:    [id number]       Weight:       181.7 lb Date of Birth:  [birth year]        BSA:          1.941 m Patient Age:    74 years         BP:           163/82 mmHg Patient Gender: F                HR:           92 bpm. Exam Location:  Inpatient Procedure: 2D Echo, Cardiac Doppler, Color Doppler and Saline Contrast Bubble            Study Indications:  Stroke  History:         Patient has prior history of Echocardiogram examinations, most                  recent 03/24/2020. Risk Factors:Hypertension and Diabetes. CKD.  Sonographer:     Clayton Lefort RDCS (AE) Referring Phys:  [place] Diagnosing Phys: Ena Dawley MD PROCEDURE: After discussion of the risks and benefits of a TEE, an informed consent was obtained from the patient. The transesophogeal probe was passed without difficulty through the esophogus of the patient. Sedation performed by different physician. The patient was monitored while under deep sedation. Anesthestetic sedation was provided intravenously by Anesthesiology: 207.76mg  of Propofol. Image quality was adequate. The patient's vital signs; including heart rate, blood pressure, and oxygen saturation; remained stable throughout the procedure. The patient developed no complications during the procedure. IMPRESSIONS  1. No mass or vegetations were identified on the mitral or other valves. No thrombus in the left atrial appendage. Negative bubble study.  2. Left ventricular ejection fraction, by estimation, is 70 to 75%. The left ventricle has hyperdynamic function. The left ventricle has no regional wall motion abnormalities. There is mild concentric left ventricular hypertrophy. Left ventricular diastolic function could not be evaluated.  3. Right ventricular systolic function is normal. The right ventricular size is normal.  4. Left atrial size was mildly dilated.  No left atrial/left atrial appendage thrombus was detected. The LAA emptying velocity was 60 cm/s.  5. The mitral valve is normal in structure. Mild mitral valve regurgitation. No evidence of mitral stenosis.  6. The aortic valve is normal in structure. Aortic valve regurgitation is mild. No aortic stenosis is present.  7. There is mild (Grade II) plaque.  8. The inferior vena cava is normal in size with greater than 50% respiratory variability, suggesting right atrial pressure of 3 mmHg.  9. Agitated saline contrast bubble study was negative, with no evidence of any interatrial shunt. Conclusion(s)/Recommendation(s): No LA/LAA thrombus identified. Negative bubble study for interatrial shunt. No intracardiac source of embolism detected on this on this transesophageal echocardiogram. FINDINGS  Left Ventricle: Left ventricular ejection fraction, by estimation, is 70 to 75%. The left ventricle has hyperdynamic function. The left ventricle has no regional wall motion abnormalities. The left ventricular internal cavity size was normal in size. There is mild concentric left ventricular hypertrophy. Left ventricular diastolic function could not be evaluated. Right Ventricle: The right ventricular size is normal. No increase in right ventricular wall thickness. Right ventricular systolic function is normal. Left Atrium: Left atrial size was mildly dilated. No left atrial/left atrial appendage thrombus was detected. The LAA emptying velocity was 60 cm/s. Right Atrium: Right atrial size was normal in size. Pericardium: There is no evidence of pericardial effusion. Mitral Valve: The mitral valve is normal in structure. Normal mobility of the mitral valve leaflets. Mild mitral valve regurgitation. No evidence of mitral valve stenosis. There is no evidence of mitral valve vegetation. Tricuspid Valve: The tricuspid valve is normal in structure. Tricuspid valve regurgitation is mild . No evidence of tricuspid stenosis. There is no  evidence of tricuspid valve vegetation. Aortic Valve: The aortic valve is normal in structure. Aortic valve regurgitation is mild. No aortic stenosis is present. There is no evidence of aortic valve vegetation. Pulmonic Valve: The pulmonic valve was normal in structure. Pulmonic valve regurgitation is not visualized. No evidence of pulmonic stenosis. There is no evidence of pulmonic valve vegetation. Aorta: The aortic root is normal  in size and structure. There is mild (Grade II) plaque. Venous: The inferior vena cava is normal in size with greater than 50% respiratory variability, suggesting right atrial pressure of 3 mmHg. IAS/Shunts: No atrial level shunt detected by color flow Doppler. Agitated saline contrast was given intravenously to evaluate for intracardiac shunting. Agitated saline contrast bubble study was negative, with no evidence of any interatrial shunt. Tobias Alexander MD Electronically signed by Tobias Alexander MD Signature Date/Time: 03/26/2020/3:24:18 PM    Final     PHYSICAL EXAM    Temp:  [97.2 F (36.2 C)-100.7 F (38.2 C)] 99.1 F (37.3 C) (06/17 0800) Pulse Rate:  [78-132] 86 (06/17 1000) Resp:  [15-26] 20 (06/17 1000) BP: (107-179)/(64-126) 151/94 (06/17 1000) SpO2:  [93 %-100 %] 97 % (06/17 1000) Weight:  [81.6 kg] 81.6 kg (06/17 0325)  General - Well nourished, well developed, lethargic.  Ophthalmologic - fundi not visualized due to noncooperation.  Cardiovascular - Regular rhythm and rate.  Neuro - lethargic but eyes open, awake, alert, orientated to place, age and time. Eyes right gaze preference, not crossing midline, PERRL. Left hemianopia and left neglect. Left facial droop. Tongue protrusion not cooperative. LUE flaccid but 2/5 withdraw to pain. LLE 3-/5 withdraw on pain stimulation. RUE spontaneous against gravity and purposeful. RLE at least 3/5 on pain stimulation. Sensation subjectively symmetrical face and BUEs, but decreased on the LLE comparing with RLE,  coordination intact right FTN and gait not tested.   ASSESSMENT/PLAN Ms. TEENA MANGUS is a 74 y.o. female with history of HTN, HLD, DB presenting with slurred speech, L sided weakness and ataxia. Received IV tPA 03/23/2020 at 2252. Worsening in ED. CTA confirmed distal R M1 occlusion. Taken to IR.    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 ASPECTS 10.     CTA head & neck distal R M2 occlusion w/ moderate collateral flow R MCA territory.   Cerebral angio angio w/ TICI3 revascularization R M1 occlusion   Post IR CT no ICH or mass effect   CT head 6/14 0234 no acute abnormality. Small vessel disease.   CT head 6/14 1232 R MCA territory infarct sparing basal ganglia  CT head 6/14 2159 progressive R MCA territory infarct w/ basal ganglia sparing. Hyperdense R M2  MRI  Large R MCA territory infarct w/ petechial hemorrhage. Scattered small R PCA territory infarcts. Punctate poster L periventricular WM infarct. Old B basal ganglia and thalamic lacunes.   MRA  Reocclusion distal R M1  EEG R frontal sharp waves, generalized and lateralized R brain slowing  LE Doppler  No DVT  2D Echo EF 70-75%. No obvious source of embolus. ?Echodensity L side of MV  TEE normal MV, EF 70-75%, no PFO  Will need loop recorder before d/c to CIR  TG>500->247, Direct LDL 63.8  HgbA1c 8.9  Heparin 5000 units sq tid for VTE prophylaxis  aspirin 81 mg daily prior to admission, now on ASA 325 & Plavix 75 DAPT for 3 months and then plavix alone given right MCA reocclusion.   Therapy recommendations:  CIR. Consult placed  Disposition:  pending   Acute Respiratory Failure  Intubated for IR, left intubated d/t slow response to reversal and involuntary flapping of RUE   Extubated 6/14 followed by coffee ground emesis   Bronchial hygeine  Was on nasal  trumpet ->  now off  Monitoring for reintubation, though improved -> much stabilized now  Encephalopathy Emesis post extubation  Fever, Elevated Lactate  Coffee ground emesis s/p extubation  TMax 102.8->101.7->100.7   Hemodynamically stable  KUB w/o ileus, + hydroureteronephrosis   UA w/ WBC > 50 & UCx E coli  BCx no growth   Trop 28->45   Lactic acid 4.1->3.2->2.6  Cefepime 6/14>>6/15  Meropenem 6/15>>  Hypertension  Home meds:  amlodipine 5, lisinopril 20, metoprolol 100 bid  . On Cleviprex -> off  . SBP goal now < 180 . On amlodipine 10 . Long-term BP goal 130-150 given right MCA reocclusion  Hyperlipidemia  Home meds:  pravachol 40  TG>500->247, Direct LDL 63.8, goal < 70  Resumed pravastatin 40  Add zetia 10  Continue statin and zetia at discharge  Diabetes type II Uncontrolled  Home meds:  trulicity 0.75, glimepiride 1, metformin 500 bid, ozempic 0.25-0.5  HgbA1c 8.9, goal < 7.0  Hyperglycemia -> off D5  On TF  CBGs  SSI  Close PCP follow up for better DM control  Dysphagia . Secondary to stroke . NPO . Cortrak placed . TF @ 55 and NS @ 20 . Speech on board   Other Stroke Risk Factors  Advanced age  Family hx stroke (father)  Other Active Problems  Leukocytosis, WBC 12.2->14.3->11.6->9.2  AKI on CRF, Cre 1.6->1.54->1.7->1.37->1.23. renal US +B cysts, no obstruction. Plans for OP nephrology PTA.   Hypokalemia 3.4 - supplement  Hospital day # 3  This patient is critically ill due to right MCA stroke status post TPA, right MCA occlusion status post EVT, respiratory failure, tachycardia and at significant risk of neurological worsening, death form recurrent stroke, hemorrhagic conversion, cerebral edema, brain herniation, heart failure, seizure. This patient's care requires constant monitoring of vital signs, hemodynamics, respiratory and cardiac monitoring, review of multiple databases, neurological assessment, discussion with  family, other specialists and medical decision making of high complexity. I spent 35 minutes of neurocritical care time in the care of this patient.   Marvel Plan, MD PhD Stroke Neurology 03/27/2020 10:36 AM   To contact Stroke Continuity provider, please refer to [email or web address]. After hours, contact General Neurology

## 2020-03-27 NOTE — Progress Notes (Signed)
Rehab Admissions Coordinator Note:  Per PT and OT recommendation, this patient was screened by Cheri Rous for appropriateness for an Inpatient Acute Rehab Consult.  At this time, we are recommending an Inpatient Rehab consult. AC will contact attending service to request consult order.   Cheri Rous 03/27/2020, 9:17 AM  I can be reached at [phone].

## 2020-03-27 NOTE — Consult Note (Signed)
Physical Medicine and Rehabilitation Consult   Reason for Consult: Stroke with functional deficits Referring Physician: Dr. Erlinda Hong   HPI: Meredith Shaw is a 74 y.o. female with history of T2DM, HTN with retinopathy  who was admitted on 03/23/20 with slurred speech and left sided weakness. CT head negative and tPA administered. CTA angio revealed distal R-MCA M1 occlusion with moderate collateral flow in R-MCA. She underwent cerebral angio with revascularization of R-MCA M1 segment by Dr. Estanislado Pandy.  She tolerated extubation without difficulty but later that evening had coffee ground emesis with fever and worsening of MS. Due to concerns of urosepsis, she was started on IV  Meropenum. Repeat CT head showed progresive R-MCA infarct with evidence of reocclusion. 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  BLE dopplers were negative for DVT.  2D echo showed EF 70-75% with possible echodensity attached to posterior mitral valve.  TEE ordered for work up and was negative for vegetations or thrombus and bubble study negative.   EEG ordered due to lethargy and showed sharp waves in right frontal region and slow, generalized lateralization in right hemisphere due to underlying infarct.  On DAPT for embolic stroke of unknown origin and plans for loop recorder in the future.  Lethargy resolving with improvement in mentation--for MBS. She continues to have limitations due to left hemiparesis with ataxia,  L-HH with right gaze preference and signs of dysphagia. CIR recommended due to functional decline.    Review of Systems  Constitutional: Negative for chills and fever.  HENT: Negative for hearing loss and  tinnitus.   Eyes: Negative for blurred vision and double vision.  Respiratory: Negative for cough and shortness of breath.   Cardiovascular: Negative for chest pain and palpitations.  Gastrointestinal: Negative for constipation, heartburn and nausea.  Genitourinary: Negative for dysuria and urgency.  Musculoskeletal: Negative for joint pain and myalgias.  Skin: Negative for rash.  Neurological: Positive for focal weakness. Negative for dizziness and headaches.  Psychiatric/Behavioral: Negative for depression. The patient is not nervous/anxious.     Past Medical History:  Diagnosis Date  . Arthritis    OA AND PAIN BOTH KNEES -RIGHT KNEE HURTS WORSE THAN LEFT  . Cataract   . Diabetes mellitus    ORAL MEDICATION - NO INSULIN  . Hypertension   . Hypertensive retinopathy    OU  . Hypothyroidism   . Kidney stones    NONE AT PRESENT TIME THAT PT IS AWARE OF  . Osteoporosis     Past Surgical History:  Procedure Laterality Date  . ABDOMINAL HYSTERECTOMY    . CATARACT EXTRACTION    . CHOLECYSTECTOMY  1972  . COLONOSCOPY    . EYE SURGERY     RIGHT CATARACT EXTRACTON WITH LENS IMPLANT  . GROWTH REMOVED FROM THYROID    . INTRAMEDULLARY (IM) NAIL INTERTROCHANTERIC Left 02/10/2018   Procedure: INTRAMEDULLARY (IM) NAIL INTERTROCHANTRIC;  Surgeon: Mcarthur Rossetti, MD;  Location: [facility];  Service: Orthopedics;  Laterality: Left;  . INTRAMEDULLARY (IM) NAIL INTERTROCHANTERIC Right 08/20/2018   Procedure: ORIF RIGHT HIP INTERTROC FRACTURE;  Surgeon: Mcarthur Rossetti, MD;  Location: [facility];  Service: Orthopedics;  Laterality: Right;  . IR CT HEAD LTD  03/24/2020  . IR PERCUTANEOUS ART THROMBECTOMY/INFUSION  INTRACRANIAL INC DIAG ANGIO  03/24/2020  . left hip surgery   02/2018  . ORIF HIP FRACTURE Right 08/20/2018  . PERCUTANEOUS NEPHROLITHOTOMY    . RADIOLOGY WITH ANESTHESIA N/A 03/23/2020   Procedure: IR WITH ANESTHESIA;  Surgeon: Luanne Bras, MD;  Location: [facility];  Service:  Radiology;  Laterality: N/A;  . TOTAL KNEE ARTHROPLASTY  06/09/2012   Procedure: TOTAL KNEE ARTHROPLASTY;  Surgeon: Mcarthur Rossetti, MD;  Location: [facility];  Service: Orthopedics;  Laterality: Right;  Right Total Knee Arthroplasty  . TOTAL KNEE ARTHROPLASTY Left 04/09/2014   Procedure: LEFT TOTAL KNEE ARTHROPLASTY;  Surgeon: Mcarthur Rossetti, MD;  Location: [facility];  Service: Orthopedics;  Laterality: Left;  . TUBAL LIGATION      Family History  Problem Relation Age of Onset  . Heart disease Mother   . Breast cancer Mother        21's  . Stroke Father   . Heart disease Father   . Healthy Daughter   . Healthy Son   . Liver disease Maternal Grandmother        gall stones imbedded in liver - blead to death when in surgery   . Pneumonia Maternal Grandfather   . Diabetes Paternal Grandmother    Social History:  reports that she has never smoked. She has never used smokeless tobacco. She reports that she does not drink alcohol and does not use drugs. Allergies:  Allergies  Allergen Reactions  . Semaglutide Nausea And Vomiting    PO Rybelsus  . Farxiga [Dapagliflozin]     Yeast infections  . Penicillins Itching and Rash    Has tolerated Cefazlin and Ceftriaxone in past    Facility-Administered Medications Prior to Admission  Medication Dose Route Frequency Provider Last Rate Last Admin  . Bevacizumab (AVASTIN) SOLN 1.25 mg  1.25 mg Intravitreal  Bernarda Caffey, MD   1.25 mg at 12/08/18 1150  . denosumab (PROLIA) injection 60 mg  60 mg Subcutaneous Q6 months Baruch Gouty, FNP   60 mg at 05/17/19 1313   Medications Prior to Admission  Medication Sig Dispense Refill  . amLODipine (NORVASC) 5 MG tablet TAKE ONE (1) TABLET EACH DAY (Patient taking differently: Take 5 mg by mouth daily. ) 90 tablet 0  . aspirin EC 81 MG tablet Take 81 mg by mouth daily.    . cholecalciferol (VITAMIN D) 1000 units tablet Take 5,000 Units by mouth daily.     Marland Kitchen denosumab (PROLIA) 60 MG/ML SOSY  injection Inject 60 mg into the skin every 6 (six) months. 1 mL 0  . Dulaglutide (TRULICITY) 2.77 AJ/2.8NO SOPN Inject 0.75 mg into the skin once a week.     Marland Kitchen glimepiride (AMARYL) 1 MG tablet Take 1 mg by mouth daily with breakfast.     . ibuprofen (ADVIL) 200 MG tablet Take 400 mg by mouth 2 (two) times daily as needed for moderate pain.     Marland Kitchen levothyroxine (SYNTHROID) 125 MCG tablet Take 1 tablet (125 mcg total) by mouth daily. 90 tablet 3  . lisinopril (ZESTRIL) 20 MG tablet Take 1 tablet (20 mg total) by mouth daily. 90 tablet 1  . metFORMIN (GLUCOPHAGE) 500 MG tablet Take 1 tablet (500 mg total) by mouth 2 (two) times daily with a meal. (Patient taking differently: Take 500 mg by mouth in the morning and at bedtime. ) 180 tablet 2  . metoprolol tartrate (LOPRESSOR) 100 MG tablet TAKE ONE TABLET BY MOUTH TWICE DAILY (Patient taking differently:  Take 100 mg by mouth 2 (two) times daily. ) 180 tablet 0  . naproxen sodium (ALEVE) 220 MG tablet Take 220 mg by mouth 2 (two) times daily as needed (for pain).    . Omega-3 Fatty Acids (FISH OIL PO) Take 1 capsule by mouth daily.    . pravastatin (PRAVACHOL) 40 MG tablet Take 1 tablet (40 mg total) by mouth at bedtime. 90 tablet 2  . Blood Glucose Monitoring Suppl (ONETOUCH VERIO) w/Device KIT 1 each by Does not apply route daily. 1 kit 0  . glucose blood (ONETOUCH VERIO) test strip Use as instructed 100 each 12  . Lancets (ONETOUCH ULTRASOFT) lancets Use as instructed 100 each 12    Home: Home Living Family/patient expects to be discharged to:: Private residence Living Arrangements: Spouse/significant other Available Help at Discharge: Family, Available 24 hours/day Type of Home: House Home Access: Stairs to enter Technical brewer of Steps: 2 Entrance Stairs-Rails: None Home Layout: One level Bathroom Shower/Tub: Multimedia programmer: Handicapped height Home Equipment: Environmental consultant - 2 wheels, Bedside commode, Shower seat, Cane -  single point, Hand held shower head, Grab bars - toilet, Grab bars - tub/shower, Wheelchair - manual Additional Comments: DME info obtained from prior admission, needs to be confirmed  Functional History: Prior Function Level of Independence: Needs assistance Gait / Transfers Assistance Needed: pt reports ambulating without assistive device ADL's / Homemaking Assistance Needed: pt requires assistance to don socks Functional Status:  Mobility: Bed Mobility Overal bed mobility: Needs Assistance Bed Mobility: Supine to Sit, Sit to Supine Supine to sit: Max assist, HOB elevated, +2 for physical assistance Sit to supine: Max assist, +2 for physical assistance, HOB elevated Transfers Overall transfer level: Needs assistance Equipment used: 1 person hand held assist, 2 person hand held assist Transfers: Sit to/from Stand Sit to Stand: Max assist, +2 physical assistance, From elevated surface General transfer comment: pt performs 1 sit to stand with maxA and L knee block, 2nd sit to stand with maxA x2      ADL: ADL Overall ADL's : Needs assistance/impaired Eating/Feeding: NPO Eating/Feeding Details (indicate cue type and reason): coretrak Grooming: Maximal assistance, Brushing hair, Sitting Grooming Details (indicate cue type and reason): able to brush small section on R side of hair while seated EOB. Difficulty crossing midline to L without assist, cannot reach back Upper Body Bathing: Maximal assistance, Sitting Lower Body Bathing: Total assistance, Sitting/lateral leans, Sit to/from stand Upper Body Dressing : Maximal assistance, Sitting Lower Body Dressing: Total assistance, Sitting/lateral leans, Sit to/from stand Lower Body Dressing Details (indicate cue type and reason): recieves LB dressing assist at baseline Toilet Transfer: Moderate assistance, +2 for physical assistance, +2 for safety/equipment Toileting- Clothing Manipulation and Hygiene: Maximal assistance Functional  mobility during ADLs: Maximal assistance, +2 for physical assistance, +2 for safety/equipment  Cognition: Cognition Overall Cognitive Status: Impaired/Different from baseline Orientation Level: Oriented X4 Cognition Arousal/Alertness: Awake/alert Behavior During Therapy: WFL for tasks assessed/performed Overall Cognitive Status: Impaired/Different from baseline Area of Impairment: Attention, Memory, Following commands, Safety/judgement, Awareness, Problem solving Current Attention Level: Sustained Memory: Decreased recall of precautions, Decreased short-term memory Following Commands: Follows one step commands consistently, Follows one step commands with increased time Safety/Judgement: Decreased awareness of safety, Decreased awareness of deficits Awareness: Intellectual Problem Solving: Slow processing, Requires verbal cues General Comments: pt with poor awareness into current deficits, needs frequent cues for safety. Difficulty following one step commands without multimodal cues and redirection  Blood pressure (!) 145/83, pulse 98, temperature 98.3 F (  36.8 C), temperature source Oral, resp. rate (!) 26, weight 81.6 kg, SpO2 97 %. Physical Exam   General: Alert and oriented x 3, No apparent distress HEENT: Head is normocephalic, atraumatic, Left sided neglect, sclera anicteric, oral mucosa pink and moist, dentition intact, ext ear canals clear, NGT in place.  Neck: Supple without JVD or lymphadenopathy Heart: Reg rate and rhythm. No murmurs rubs or gallops Chest: CTA bilaterally without wheezes, rales, or rhonchi; no distress Abdomen: Soft, non-tender, non-distended, bowel sounds positive. Extremities: No clubbing, cyanosis, or edema. Pulses are 2+ Skin: Clean and intact without signs of breakdown Neuro: Pt is cognitively appropriate with normal insight, memory, and awareness. Slow speech, fatigued. Left sided neglect. Cranial nerves 2-12 are intact. Decreased sensation in left  lower extremity. 5/5 strength on right side. 0/5 right SA, 2/5 EF, 1/5 EE, 0/5 WF and hand grip. LLE: 2/5 HF, KE, 0/5 DF/PF.  Musculoskeletal: Full ROM, No pain with AROM or PROM in the neck, trunk, or extremities. Posture appropriate Psych: Pt's affect is appropriate. Pt is cooperative   Results for orders placed or performed during the hospital encounter of 03/23/20 (from the past 24 hour(s))  Glucose, capillary     Status: Abnormal   Collection Time: 03/26/20  3:19 PM  Result Value Ref Range   Glucose-Capillary 131 (H) 70 - 99 mg/dL   Comment 1 Notify RN    Comment 2 Document in Chart   Glucose, capillary     Status: Abnormal   Collection Time: 03/26/20  7:20 PM  Result Value Ref Range   Glucose-Capillary 119 (H) 70 - 99 mg/dL  Glucose, capillary     Status: Abnormal   Collection Time: 03/26/20 11:22 PM  Result Value Ref Range   Glucose-Capillary 156 (H) 70 - 99 mg/dL  Glucose, capillary     Status: Abnormal   Collection Time: 03/27/20  3:14 AM  Result Value Ref Range   Glucose-Capillary 223 (H) 70 - 99 mg/dL  CBC     Status: Abnormal   Collection Time: 03/27/20  7:07 AM  Result Value Ref Range   WBC 9.2 4.0 - 10.5 K/uL   RBC 3.54 (L) 3.87 - 5.11 MIL/uL   Hemoglobin 10.4 (L) 12.0 - 15.0 g/dL   HCT 31.3 (L) 36 - 46 %   MCV 88.4 80.0 - 100.0 fL   MCH 29.4 26.0 - 34.0 pg   MCHC 33.2 30.0 - 36.0 g/dL   RDW 13.5 11.5 - 15.5 %   Platelets 207 150 - 400 K/uL   nRBC 0.0 0.0 - 0.2 %  Comprehensive metabolic panel     Status: Abnormal   Collection Time: 03/27/20  7:07 AM  Result Value Ref Range   Sodium 140 135 - 145 mmol/L   Potassium 3.4 (L) 3.5 - 5.1 mmol/L   Chloride 110 98 - 111 mmol/L   CO2 21 (L) 22 - 32 mmol/L   Glucose, Bld 238 (H) 70 - 99 mg/dL   BUN 24 (H) 8 - 23 mg/dL   Creatinine, Ser 1.23 (H) 0.44 - 1.00 mg/dL   Calcium 7.9 (L) 8.9 - 10.3 mg/dL   Total Protein 5.7 (L) 6.5 - 8.1 g/dL   Albumin 2.3 (L) 3.5 - 5.0 g/dL   AST 27 15 - 41 U/L   ALT 14 0 - 44 U/L    Alkaline Phosphatase 58 38 - 126 U/L   Total Bilirubin 0.7 0.3 - 1.2 mg/dL   GFR calc non Af Amer 43 (L) >  60 mL/min   GFR calc Af Amer 50 (L) >60 mL/min   Anion gap 9 5 - 15  Phosphorus     Status: Abnormal   Collection Time: 03/27/20  7:07 AM  Result Value Ref Range   Phosphorus 2.2 (L) 2.5 - 4.6 mg/dL  Magnesium     Status: None   Collection Time: 03/27/20  7:07 AM  Result Value Ref Range   Magnesium 2.1 1.7 - 2.4 mg/dL  Triglycerides     Status: Abnormal   Collection Time: 03/27/20  7:07 AM  Result Value Ref Range   Triglycerides 247 (H) <150 mg/dL  Glucose, capillary     Status: Abnormal   Collection Time: 03/27/20  7:44 AM  Result Value Ref Range   Glucose-Capillary 238 (H) 70 - 99 mg/dL  Glucose, capillary     Status: Abnormal   Collection Time: 03/27/20 11:22 AM  Result Value Ref Range   Glucose-Capillary 318 (H) 70 - 99 mg/dL   MR ANGIO HEAD WO CONTRAST  Result Date: 03/25/2020 CLINICAL DATA:  Follow-up examination for acute stroke. EXAM: MRA HEAD WITHOUT CONTRAST TECHNIQUE: Angiographic images of the Circle of Willis were obtained using MRA technique without intravenous contrast. COMPARISON:  Prior brain MRI from earlier the same day as well as recent CTA from 03/23/2020. FINDINGS: ANTERIOR CIRCULATION: Examination degraded by motion artifact, limiting assessment. Distal cervical segments of the internal carotid arteries are widely patent with symmetric antegrade flow. Petrous, cavernous, and supraclinoid ICAs patent without appreciable stenosis or other abnormality. A1 segments patent bilaterally. Normal anterior communicating artery complex. Anterior cerebral arteries remain widely patent to their distal aspects without stenosis. Right M1 segment patent proximally, but poorly visualized at its mid and distal aspect, and suspected to be reoccluded since prior catheter directed intervention. Right MCA bifurcation not well seen. Few small attenuated branches remain patent within  the distal right MCA distribution, relatively stable from initial CTA. Left M1 segment widely patent. Normal left MCA bifurcation. Distal left MCA branches well perfused. POSTERIOR CIRCULATION: Vertebral arteries patent to the vertebrobasilar junction without stenosis. Patent right PICA. Left PICA not seen. Basilar patent to its distal aspect without stenosis. Superior cerebral arteries grossly patent proximally. Left PCA supplied via the basilar. Predominant fetal type origin of the right PCA. PCAs remain patent to their distal aspects without stenosis. No appreciable intracranial aneurysm. IMPRESSION: 1. Technically limited exam due to motion artifact. 2. Probable reocclusion of the distal right M1 segment since recent catheter directed revascularization. Few attenuated small vessels remain patent within the right MCA distribution distally. Overall, appearance is similar as compared to initial presenting CTA. 3. No other intracranial large vessel occlusion or high-grade stenosis. Electronically Signed   By: Jeannine Boga M.D.   On: 03/25/2020 21:08   MR BRAIN WO CONTRAST  Result Date: 03/25/2020 CLINICAL DATA:  Follow-up examination for acute stroke. EXAM: MRI HEAD WITHOUT CONTRAST TECHNIQUE: Multiplanar, multiecho pulse sequences of the brain and surrounding structures were obtained without intravenous contrast. COMPARISON:  Prior head CT from 03/24/2013 as well as previous studies. FINDINGS: Brain: Examination moderately degraded by motion artifact. Generalized age-related cerebral atrophy. Patchy T2/FLAIR hyperintensity within the periventricular and deep white matter both cerebral hemispheres as well as the pons, most consistent with chronic microvascular ischemic disease. Few scatter remote lacunar infarcts noted about the bilateral basal ganglia and thalami. Small remote right cerebellar infarct with associated chronic hemosiderin staining noted. Large confluent area of restricted diffusion seen  involving the majority of the right  MCA distribution, consistent with large acute right MCA territory infarct. Associated gyral swelling and edema throughout the area of infarction. Regional mass effect without significant right-to-left midline shift. Basilar cisterns remain widely patent. Associated petechial hemorrhage throughout the area of infarction without frank hemorrhagic transformation. Additional scattered patchy small volume foci of acute ischemia seen involving the right PCA territory, with involvement of the splenium, right thalamus, and right temporal occipital region. This is likely based on the predominant fetal type origin right PCA seen on corresponding CTA and MRA. Additional punctate focus of acute ischemia seen at the posterior left frontal periventricular white matter (series 2, image 25). No mass lesion or hydrocephalus. No extra-axial fluid collection. Pituitary gland suprasellar region within normal limits. Midline structures intact. Vascular: Loss of normal flow void within the distal right M1 segment extending into right M2 branches at the sylvian fissure. Associated susceptibility artifact seen on SWI sequence (series 7, image 33), consistent with thrombus. Finding consistent with reocclusion of previously revascularized right MCA, also seen on corresponding MRA. Major intracranial vascular flow voids otherwise maintained. Skull and upper cervical spine: Craniocervical junction within normal limits. Bone marrow signal intensity normal. No scalp soft tissue abnormality. Sinuses/Orbits: Patient status post bilateral ocular lens replacement. Globes and orbital soft tissues demonstrate no acute finding. Paranasal sinuses are largely clear. No significant mastoid effusion. Other: None. IMPRESSION: 1. 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 Associated petechial hemorrhage without frank hemorrhagic transformation. 2. Additional scattered small volume acute ischemic  nonhemorrhagic right PCA territory infarcts, likely based on the fetal type origin right PCA as seen on corresponding MRA and CTA. 3. Additional punctate focus of acute ischemia involving the posterior left frontal periventricular white matter. 4. Loss of normal flow void with susceptibility artifact involving distal right M1/M2 branch, consistent with reocclusion. Finding consistent with findings seen on corresponding MRA. 5. Underlying age-related cerebral atrophy with chronic microvascular ischemic disease, with a few scattered remote lacunar infarcts involving the bilateral basal ganglia and thalami. Electronically Signed   By: Jeannine Boga M.D.   On: 03/25/2020 21:28   DG CHEST PORT 1 VIEW  Result Date: 03/26/2020 CLINICAL DATA:  Code stroke EXAM: PORTABLE CHEST 1 VIEW COMPARISON:  03/25/2020 FINDINGS: Cardiomediastinal contours and hilar structures are stable with central pulmonary vascular engorgement, heart size accentuated by low volumes and portable technique. No consolidation. No pleural effusion. Suspect subsegmental RIGHT lower lobe atelectasis as before. Visualized skeletal structures are unremarkable. IMPRESSION: No acute cardiopulmonary disease. Mild RIGHT basilar atelectasis similar to prior studies. Electronically Signed   By: Zetta Bills M.D.   On: 03/26/2020 08:32   EEG adult  Result Date: 03/25/2020 Lora Havens, MD     03/25/2020  1:44 PM Patient [name] MRN: [id number] Epilepsy Attending: Lora Havens Referring Physician/Provider: Dr. Rosalin Hawking Date: 03/25/2020 Duration: 25.29 minutes Patient history: 74 year old female presenting with slurred speech, left-sided weakness and ataxia.  MRI brain showed right MCA infarct.  EEG evaluate for seizures. Level of alertness: Awake, asleep AEDs during EEG study: None Technical aspects: This EEG study was done with scalp electrodes positioned according to the 10-20 International system of electrode placement.  Electrical activity was acquired at a sampling rate of 500Hz  and reviewed with a high frequency filter of 70Hz  and a low frequency filter of 1Hz . EEG data were recorded continuously and digitally stored. Description: During awake state, no clear posterior dominant rhythm was seen.  Sleep was characterized by vertex, maximal frontocentral region.  EEG showed continuous generalized and lateralized right hemisphere 3 to 6 Hz theta-delta slowing.  Sharp waves were also seen in right frontal region which appeared rhythmic without any clear evolution when patient was stimulated at the end of the study.  Hyperventilation and photic stimulation were not performed.   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  The sharp waves appeared rhythmic when patient was stimulated without any clear evolution and is more likely related to arousal.  No definite seizures were seen during the study.Lora Havens   ECHO TEE  Result Date: 03/26/2020    TRANSESOPHOGEAL ECHO REPORT   Patient Name:   Meredith Shaw Date of Exam: 03/26/2020 Medical Rec #:  [id number]        Height:       67.0 in Accession #:    [id number]       Weight:       181.7 lb Date of Birth:  [birth year]        BSA:          1.941 m Patient Age:    29 years         BP:           163/82 mmHg Patient Gender: F                HR:           92 bpm. Exam Location:  Inpatient Procedure: 2D Echo, Cardiac Doppler, Color Doppler and Saline Contrast Bubble            Study Indications:     Stroke  History:         Patient has prior history of Echocardiogram examinations, most                  recent 03/24/2020. Risk Factors:Hypertension and Diabetes. CKD.  Sonographer:     Clayton Lefort RDCS (AE) Referring Phys:  [place] Diagnosing Phys: Ena Dawley MD PROCEDURE: After discussion of the risks and benefits of a TEE, an informed consent was obtained from the patient. The transesophogeal probe was passed without difficulty through the esophogus of the patient. Sedation performed by different physician. The patient was monitored while under deep sedation. Anesthestetic sedation was provided intravenously by Anesthesiology: 207.35m of Propofol. Image quality was adequate. The patient's vital signs; including heart rate, blood pressure, and oxygen saturation; remained stable throughout the procedure. The patient developed no complications during the procedure. IMPRESSIONS  1. No mass or vegetations were identified on the mitral or other valves. No thrombus in the left atrial appendage. Negative bubble study.  2. Left ventricular ejection fraction, by estimation, is 70 to 75%. The left ventricle has hyperdynamic function. The left ventricle has no regional wall motion abnormalities. There is mild concentric left ventricular hypertrophy. Left ventricular diastolic function could not be evaluated.  3. Right ventricular systolic function is normal. The right ventricular size is normal.  4. Left atrial size was mildly dilated. No left atrial/left atrial appendage thrombus was detected. The LAA emptying velocity was 60 cm/s.  5. The mitral valve is normal in structure. Mild mitral valve regurgitation. No evidence of mitral stenosis.  6. The aortic valve is normal in structure. Aortic valve regurgitation is mild. No aortic stenosis is present.  7.  There is mild (Grade II) plaque.  8. The inferior vena cava is normal in size with greater than 50% respiratory variability, suggesting right atrial pressure of 3 mmHg.  9. Agitated saline contrast bubble study was negative, with no evidence of any interatrial shunt. Conclusion(s)/Recommendation(s): No LA/LAA thrombus identified. Negative bubble study for interatrial shunt. No  intracardiac source of embolism detected on this on this transesophageal echocardiogram. FINDINGS  Left Ventricle: Left ventricular ejection fraction, by estimation, is 70 to 75%. The left ventricle has hyperdynamic function. The left ventricle has no regional wall motion abnormalities. The left ventricular internal cavity size was normal in size. There is mild concentric left ventricular hypertrophy. Left ventricular diastolic function could not be evaluated. Right Ventricle: The right ventricular size is normal. No increase in right ventricular wall thickness. Right ventricular systolic function is normal. Left Atrium: Left atrial size was mildly dilated. No left atrial/left atrial appendage thrombus was detected. The LAA emptying velocity was 60 cm/s. Right Atrium: Right atrial size was normal in size. Pericardium: There is no evidence of pericardial effusion. Mitral Valve: The mitral valve is normal in structure. Normal mobility of the mitral valve leaflets. Mild mitral valve regurgitation. No evidence of mitral valve stenosis. There is no evidence of mitral valve vegetation. Tricuspid Valve: The tricuspid valve is normal in structure. Tricuspid valve regurgitation is mild . No evidence of tricuspid stenosis. There is no evidence of tricuspid valve vegetation. Aortic Valve: The aortic valve is normal in structure. Aortic valve regurgitation is mild. No aortic stenosis is present. There is no evidence of aortic valve vegetation. Pulmonic Valve: The pulmonic valve was normal in structure. Pulmonic valve regurgitation is not visualized. No evidence of pulmonic stenosis. There is no evidence of pulmonic valve vegetation. Aorta: The aortic root is normal in size and structure. There is mild (Grade II) plaque. Venous: The inferior vena cava is normal in size with greater than 50% respiratory variability, suggesting right atrial pressure of 3 mmHg. IAS/Shunts: No atrial level shunt detected by color flow Doppler.  Agitated saline contrast was given intravenously to evaluate for intracardiac shunting. Agitated saline contrast bubble study was negative, with no evidence of any interatrial shunt. Ena Dawley MD Electronically signed by Ena Dawley MD Signature Date/Time: 03/26/2020/3:24:18 PM    Final     Assessment/Plan: Diagnosis: R MCA infarct s/p tPA.  1. Does the need for close, 24 hr/day medical supervision in concert with the patient's rehab needs make it unreasonable for this patient to be served in a less intensive setting? Yes 2. Co-Morbidities requiring supervision/potential complications: HTN, HLD, Left sided weakness, ataxia, dysphagia 3. Due to bladder management, bowel management, safety, skin/wound care, disease management, medication administration, pain management and patient education, does the patient require 24 hr/day rehab nursing? Yes 4. Does the patient require coordinated care of a physician, rehab nurse, therapy disciplines of PT, OT, SLP to address physical and functional deficits in the context of the above medical diagnosis(es)? Yes Addressing deficits in the following areas: balance, endurance, locomotion, strength, transferring, bowel/bladder control and bathing 5. Can the patient actively participate in an intensive therapy program of at least 3 hrs of therapy per day at least 5 days per week? Yes 6. The potential for patient to make measurable gains while on inpatient rehab is excellent 7. Anticipated functional outcomes upon discharge from inpatient rehab are min assist  with PT, min assist with OT, supervision with SLP. 8. Estimated rehab length of stay to reach the above functional goals  is: 16-20 days 9. Anticipated discharge destination: Home 10. Overall Rehab/Functional Prognosis: excellent  RECOMMENDATIONS: This patient's condition is appropriate for continued rehabilitative care in the following setting: CIR Patient has agreed to participate in recommended  program. Yes Note that insurance prior authorization may be required for reimbursement for recommended care.  Comment: Mrs. Dingley would be an excellent CIR candidate. She lives with her husband. Her son lives very close by and her daughter is also a great source of support. Thank you for this consult. We will continue to follow in Mrs. Davitt's care.   Bary Leriche, PA-C 03/27/2020   I have personally performed a face to face diagnostic evaluation, including, but not limited to relevant history and physical exam findings, of this patient and developed relevant assessment and plan.  Additionally, I have reviewed and concur with the physician assistant's documentation above.  The patient's status has not changed. The original post admission physician evaluation remains appropriate, and any changes from the pre-admission screening or documentation from the acute chart are noted above.   Leeroy Cha, MD

## 2020-03-27 NOTE — Progress Notes (Signed)
Physical Therapy Treatment Patient Details [name] MRN: [id number] DOB: 09-24-1946 Today's Date: 03/27/2020    History of Present Illness 74 y.o female admitted with sudden onset Lt sided weakness and surred speech.  Tpa was administered.  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 pt extubated 6/14, but had increasing lethsrgy > possibly septic due to UTI.  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  PMH includes: hypertensive retinopathy, DM, arthritis, s/p TKA, s/p ORIF Rt hip, s/p ORIF LT hip.    PT Comments    Pt tolerates treatment well, performing multiple transfers during session. Pt demonstrates the ability to maintain standing position for multiple minutes with PT physical assistance. Pt does have a preference to lean left, pushing left at times with RUE. PT provides education to family on D/C recommendations and on the need for family to address the patient on the left side to encourage left attention. Pt will continue to benefit from acute PT POC to improve mobility and to reduce falls risk. PT continues to recommend high intensity inpatient PT services to aide in a return to modI mobility.   Follow Up Recommendations  CIR     Equipment Recommendations  Hospital bed (mechanical lift if D/C home today)    Recommendations for Other Services       Precautions / Restrictions Precautions Precautions: Fall Precaution Comments: L hemi/neglect Restrictions Weight Bearing Restrictions: No    Mobility  Bed Mobility Overal bed mobility: Needs Assistance Bed Mobility: Supine to Sit     Supine to sit: Max assist;HOB elevated        Transfers Overall transfer level: Needs assistance Equipment used: 1 person hand held assist Transfers: Sit to/from [facility] Sit to Stand: Max assist Stand pivot transfers: Max assist       General  transfer comment: pt performs 3 sit to stand and one stand pivot transfer, all requiring maxA and L knee block. Pt does maintain standing for 2 minutes with mod-maxA at edge of recline to allow for pericare  Ambulation/Gait                 Stairs             Wheelchair Mobility    Modified Rankin (Stroke Patients Only) Modified Rankin (Stroke Patients Only) Pre-Morbid Rankin Score: No symptoms Modified Rankin: Severe disability     Balance Overall balance assessment: Needs assistance Sitting-balance support: Feet supported;Single extremity supported Sitting balance-Leahy Scale: Poor Sitting balance - Comments: mod-maxA, pushes some to left through RUE when WB through that extremity Postural control: Left lateral lean Standing balance support: Bilateral upper extremity supported Standing balance-Leahy Scale: Zero Standing balance comment: mod-maxA for static standing                            Cognition Arousal/Alertness: Awake/alert Behavior During Therapy: Flat affect Overall Cognitive Status: Impaired/Different from baseline Area of Impairment: Attention;Memory;Following commands;Safety/judgement;Awareness;Problem solving                   Current Attention Level: Sustained Memory: Decreased recall of precautions;Decreased short-term memory Following Commands: Follows one step commands with increased time Safety/Judgement: Decreased awareness of safety;Decreased awareness of deficits Awareness: Intellectual Problem Solving: Slow processing;Requires verbal cues General Comments: pt with poor awareness of L side      Exercises  General Comments General comments (skin integrity, edema, etc.): VSS on RA      Pertinent Vitals/Pain Pain Assessment: Faces Faces Pain Scale: Hurts little more Pain Location: L groin with mobility Pain Descriptors / Indicators: Grimacing Pain Intervention(s): Monitored during session    Home Living                       Prior Function            PT Goals (current goals can now be found in the care plan section) Acute Rehab PT Goals Patient Stated Goal: return to independence Progress towards PT goals: Progressing toward goals    Frequency    Min 4X/week      PT Plan Current plan remains appropriate    Co-evaluation              AM-PAC PT "6 Clicks" Mobility   Outcome Measure  Help needed turning from your back to your side while in a flat bed without using bedrails?: Total Help needed moving from lying on your back to sitting on the side of a flat bed without using bedrails?: Total Help needed moving to and from a bed to a chair (including a wheelchair)?: Total Help needed standing up from a chair using your arms (e.g., wheelchair or bedside chair)?: Total Help needed to walk in hospital room?: Total Help needed climbing 3-5 steps with a railing? : Total 6 Click Score: 6    End of Session   Activity Tolerance: Patient tolerated treatment well Patient left: in chair;with call bell/phone within reach;with chair alarm set;with nursing/sitter in room Nurse Communication: Mobility status;Need for lift equipment PT Visit Diagnosis: Other abnormalities of gait and mobility (R26.89);Muscle weakness (generalized) (M62.81);History of falling (Z91.81);Other symptoms and signs involving the nervous system (R29.898)     Time: 1610-9604 PT Time Calculation (min) (ACUTE ONLY): 40 min  Charges:  $Therapeutic Activity: 38-52 mins                     Arlyss Gandy, PT, DPT Acute Rehabilitation Pager: [phone]    Arlyss Gandy 03/27/2020, 12:47 PM

## 2020-03-27 NOTE — Progress Notes (Addendum)
NAME:  Meredith Shaw, MRN:  [id number], DOB:  22-Jun-1946, LOS: 3 ADMISSION DATE:  03/23/2020, CONSULTATION DATE: 03/24/2020 REFERRING MD:  A[place]or, CHIEF COMPLAINT: Right MCA M1 stroke  Brief History   74 year old female presented as code stroke, CT head negative, CTA showed right MCA M1 segment occlusion.  Status post TPA and stentriever on 03/24/2020. Course complicated by sepsis. Found to have UTI, being treated with antibiotics.  Past Medical History  Hypertension, diabetes, hypothyroidism, hypertensive retinopathy  Significant Hospital Events   tPA movement at 2252 on 03/23/2020 S/P RT CCA angiogram followed by complete revascularization of occluded RT MCA M 1 seg with x 1 pass with 61mm x 37 mm embotrap retriver and x 1 pass with solitaire44mm x 40 mm X retriver and penumbra aspiration  Achieving a TICI 3 revascularization.  6/14 - Episode of dark emesis, lethargic. Suspected to have UTI with lactate to 4.1. Started on Abx.  6/16 - mental status improved, pt awake and responsive to commands. Urine cx growing E coli. Continuing meropenem  Consults:  Neurology Interventional radiology  Procedures:  S/P RT CCA angiogram followed by complete revascularization of occluded RT MCA M 1 seg with x 1 pass with 42mm x 37 mm embotrap retriver and x 1 pass with solitaire42mm x 40 mm X retriver and penumbra aspiration  Achieving a TICI 3 revascularization.  Significant Diagnostic Tests:  CT head 6/13: No acute intracranial abnormality  CT angio head and neck:  1. Distal right MCA M1 segment occlusion with moderate collateral flow in the right MCA territory. 2. No other intracranial arterial occlusion or high-grade stenosis. 3. Normal cervical carotid and vertebral arteries  CT head 6/14 > neg.  X-ray Abdomen 6/14 > moderate right and mild left hydroureteronephrosis. No obstruction  CT head 6/14 > progressive right MCA infarct. No hemorrhage. Hyperdense R M2 segment sylvian fissure compatible  with thrombosis  US Renal 6/14 > Asymmetric right atrophy, bilateral kidney cysts  Echo 6/14 > LVEF 70-75%. Echodensity to L atrial side of MV c/f vegetation. Recommend TEE  MRI 6/15 > Large evolving ischemic R MCA territory infarct. No hemorraghic transformation.  MRA > Reocclusion of R M1 segment  TEE 6/16 > TEE showing LVEF 70-75% with hyperdynamic function. Mildly dilated L atrium. No valvular disease detected. No evidence of mitral valve vegetation, normal mobility of leaflets, and mild valve regurgitation.   Micro Data:  MRSA 6/14 > neg Urine cx 6/14 > E coli Blood cx 6/14 > neg  Antimicrobials:  Cefepime 6/14 > 6/15 Meropenem 6/15 >   Interim history/subjective:  No acute events overnight. Patient is now on room air and breathing comfortably with good sats. Cortrak feeding tube is also in place. Pt reports that her mouth is dry. Denies any pain or discomfort.  Objective   Blood pressure 107/64, pulse 91, temperature 98.2 F (36.8 C), temperature source Oral, resp. rate 20, weight 81.6 kg, SpO2 97 %.        Intake/Output Summary (Last 24 hours) at 03/27/2020 0758 Last data filed at 03/27/2020 0700 Gross per 24 hour  Intake 1666.82 ml  Output 1710 ml  Net -43.18 ml   Filed Weights   03/23/20 2233 03/26/20 0437 03/27/20 0325  Weight: 77 kg 82.4 kg 81.6 kg    Examination: General: Adult female, resting in bed, in NAD. Cortrak in place. Neuro: Awake, alert and oriented to person, place, and time. Moves R side spontaneously and has repetitive movements of hitting bed with R arm. Opening  eyes and following commands. 4/5 strength in RUL and RLE. Minimal movement of LUE. Unable to move LLE or toes on left side. Good tone. HEENT: [place]/AT. Sclerae anicteric. On room air Cardiovascular: RRR, no M/R/G.  Lungs: Respirations even and unlabored.  CTA bilaterally, no rhonchi, crackles Abdomen: active BS, soft, NT/ND.  Musculoskeletal: No gross deformities, no edema.  Skin: Intact,  warm, no rashes.   Assessment & Plan:  Elevated lactate, fever New onset sepsis found on 6/14. Resolving. Lactate originally 4.1, most recent at 1.6. WBC normalized today at 9.2. Last ABG normal. Tmax 100.7 and receiving IV tylenol. With urine culture growing E coli, most likely 2/2 UTI.   - Meropenem 1g IV q12h - IV tylenol. Temp goal of 99.5, trend temp curve - IVF - normal saline 55mL/hr  Need for mechanical ventilation post procedure: S/p extubation on 6/14. Currently on room air with sats 93-97. Mental status improved today. - Monitor closely for intubation risk, though improved. - Bronchial hygiene.  Right MCA stroke  Status post TPA, stentriever by IR. MRI and MRA showing evolving ischemic R MCA infarct.   - Continue stroke management per neuro. - BP control - amlodipine, lisinopril, metoprolol  AKI Cr improved today at 1.37 from 1.7. Husband reports that pt was scheduled to see a nephrologist. No hydronephrosis on renal ultrasound. - I/Os - Trend renal indices  Echodensity on MV found on echo 6/14, c/f vegetatoin TEE yesterday showed no MV vegetation with mild mitral valve regurgitation.  - F/u per cards - Possible ID consult  Diabetes. HbA1c 8.9 - Continue SSI, resistant scale. - Hold home metformin, glimepiride, dulaglutide.  Best practice:  Diet: TF via Cortrak Pain/Anxiety/Delirium protocol (if indicated): na VAP protocol (if indicated): ordered DVT prophylaxis: per primary team GI prophylaxis: Protonix Glucose control: Insulin SSI Mobility: PT Code Status: Full Family Communication: updated daughter at bedside Disposition: remains in Neuro ICU  Critical care time:    Markus Jarvis, MS4

## 2020-03-27 NOTE — Evaluation (Signed)
Clinical/Bedside Swallow Evaluation Patient Details  [name] MRN: [id number] Date of Birth: [birth year]  Today's Date: 03/27/2020 Time: SLP Start Time (ACUTE ONLY): 1032 SLP Stop Time (ACUTE ONLY): 1049 SLP Time Calculation (min) (ACUTE ONLY): 17 min  Past Medical History:  Past Medical History:  Diagnosis Date  . Arthritis    OA AND PAIN BOTH KNEES -RIGHT KNEE HURTS WORSE THAN LEFT  . Cataract   . Diabetes mellitus    ORAL MEDICATION - NO INSULIN  . Hypertension   . Hypertensive retinopathy    OU  . Hypothyroidism   . Kidney stones    NONE AT PRESENT TIME THAT PT IS AWARE OF  . Osteoporosis    Past Surgical History:  Past Surgical History:  Procedure Laterality Date  . ABDOMINAL HYSTERECTOMY    . CATARACT EXTRACTION    . CHOLECYSTECTOMY  1972  . COLONOSCOPY    . EYE SURGERY     RIGHT CATARACT EXTRACTON WITH LENS IMPLANT  . GROWTH REMOVED FROM THYROID    . INTRAMEDULLARY (IM) NAIL INTERTROCHANTERIC Left 02/10/2018   Procedure: INTRAMEDULLARY (IM) NAIL INTERTROCHANTRIC;  Surgeon: Mcarthur Rossetti, MD;  Location: [facility];  Service: Orthopedics;  Laterality: Left;  . INTRAMEDULLARY (IM) NAIL INTERTROCHANTERIC Right 08/20/2018   Procedure: ORIF RIGHT HIP INTERTROC FRACTURE;  Surgeon: Mcarthur Rossetti, MD;  Location: [facility];  Service: Orthopedics;  Laterality: Right;  . left hip surgery   02/2018  . ORIF HIP FRACTURE Right 08/20/2018  . PERCUTANEOUS NEPHROLITHOTOMY    . RADIOLOGY WITH ANESTHESIA N/A 03/23/2020   Procedure: IR WITH ANESTHESIA;  Surgeon: Luanne Bras, MD;  Location: [facility];  Service: Radiology;  Laterality: N/A;  . TOTAL KNEE ARTHROPLASTY  06/09/2012   Procedure: TOTAL KNEE ARTHROPLASTY;  Surgeon: Mcarthur Rossetti, MD;  Location: [facility];  Service: Orthopedics;  Laterality: Right;  Right Total Knee Arthroplasty  . TOTAL KNEE ARTHROPLASTY Left 04/09/2014   Procedure: LEFT TOTAL KNEE ARTHROPLASTY;  Surgeon: Mcarthur Rossetti, MD;   Location: [facility];  Service: Orthopedics;  Laterality: Left;  . TUBAL LIGATION     HPI:  74 year old female presented as code stroke, CT head negative, CTA showed right MCA M1 segment occlusion.  Status post TPA and stentriever on 03/24/2020. Course complicated by sepsis. Found to have UTI, being treated with antibiotics. MRI 6/15 revealed large R MCA CVA.  CXR 6/16 with no acute findings   Assessment / Plan / Recommendation Clinical Impression  Pt presents with clinical indicators of pharyngeal dysphagia in setting of large R MCA CVA.  Pt required 2 swallows with both puree and thin liquid consistencies.  There was cough x1 following thin liquid on 1 of 10+ trials.  Pt endorsed some liquid sticking in throat.  Pt exhibited adequate oral clearance of puree.  With regular solids, pt was unable to orally manipulate and bolus was removed from oral cavity.  Pt wears dentures but they are not present.  Husband will bring dentures from home.    Recommend allowing pt small sips of water for comfort after oral care, with supervision, in moderation, when fully awake and sitting upright.    Recommend instrumental evaluation prior to initiation of PO diet.   Pt was able to follow direction without difficulty.  Pt participated in simple conversation with SLP and husband upon his arrival.  Dysarthria noted in speech.  Pt denies difficulty with word finding.  Formal evaluation of cognitive linguistic and motor speech function to follow.  SLP Visit Diagnosis: Dysphagia, oropharyngeal phase (R13.12)    Aspiration Risk  Moderate aspiration risk    Diet Recommendation NPO;Alternative means - temporary;Free water protocol after oral care   Liquid Administration via: Cup;Straw Medication Administration: Via alternative means Supervision: Full supervision/cueing for compensatory strategies Compensations: Slow rate;Small sips/bites Postural Changes: Seated upright at 90 degrees    Other  Recommendations Oral  Care Recommendations: Oral care QID;Oral care prior to ice chip/H20   Follow up Recommendations  (ST at next level of care.)      Frequency and Duration  (TBD)          Prognosis Prognosis for Safe Diet Advancement: Good      Swallow Study   General Date of Onset: 03/23/20 HPI: 74 year old female presented as code stroke, CT head negative, CTA showed right MCA M1 segment occlusion.  Status post TPA and stentriever on 03/24/2020. Course complicated by sepsis. Found to have UTI, being treated with antibiotics. MRI 6/15 revealed large R MCA CVA.  CXR 6/16 with no acute findings Type of Study: Bedside Swallow Evaluation Previous Swallow Assessment: none Diet Prior to this Study: NPO;NG Tube Temperature Spikes Noted: No Respiratory Status: Room air History of Recent Intubation: Yes Length of Intubations (days): 1 days Date extubated: 03/24/20 Behavior/Cognition: Alert;Cooperative;Pleasant mood Oral Cavity Assessment: Within Functional Limits Oral Care Completed by SLP: Recent completion by staff Oral Cavity - Dentition: Edentulous (husband will bring dentures) Self-Feeding Abilities: Needs assist Patient Positioning: Upright in bed Volitional Cough:  (Fair) Volitional Swallow: Able to elicit    Oral/Motor/Sensory Function Overall Oral Motor/Sensory Function: Moderate impairment Facial ROM: Reduced right Facial Symmetry: Abnormal symmetry right Lingual ROM: Reduced right Lingual Symmetry: Abnormal symmetry right Lingual Strength: Reduced (significantly) Velum: Within Functional Limits Mandible:  (Reduced excursion)   Ice Chips Ice chips: Not tested   Thin Liquid Thin Liquid: Impaired Presentation: Cup;Spoon;Straw Oral Phase Functional Implications: Left anterior spillage Pharyngeal  Phase Impairments: Cough - Immediate;Multiple swallows    Nectar Thick Nectar Thick Liquid: Not tested   Honey Thick Honey Thick Liquid: Not tested   Puree Puree: Impaired Presentation:  Spoon Pharyngeal Phase Impairments: Multiple swallows   Solid     Solid: Impaired Oral Phase Impairments: Poor awareness of bolus;Impaired mastication Other Comments: removed from oral cavity      Kerrie Pleasure, MA, CCC-SLP Acute Rehabilitation Services Office: [phone]  03/27/2020,11:11 AM

## 2020-03-28 ENCOUNTER — Inpatient Hospital Stay (HOSPITAL_COMMUNITY): Payer: Medicare Other

## 2020-03-28 LAB — GLUCOSE, CAPILLARY
Glucose-Capillary: 231 mg/dL — ABNORMAL HIGH (ref 70–99)
Glucose-Capillary: 231 mg/dL — ABNORMAL HIGH (ref 70–99)
Glucose-Capillary: 281 mg/dL — ABNORMAL HIGH (ref 70–99)
Glucose-Capillary: 296 mg/dL — ABNORMAL HIGH (ref 70–99)
Glucose-Capillary: 304 mg/dL — ABNORMAL HIGH (ref 70–99)
Glucose-Capillary: 306 mg/dL — ABNORMAL HIGH (ref 70–99)

## 2020-03-28 LAB — CBC
HCT: 33.7 % — ABNORMAL LOW (ref 36.0–46.0)
Hemoglobin: 11.2 g/dL — ABNORMAL LOW (ref 12.0–15.0)
MCH: 29.4 pg (ref 26.0–34.0)
MCHC: 33.2 g/dL (ref 30.0–36.0)
MCV: 88.5 fL (ref 80.0–100.0)
Platelets: 249 10*3/uL (ref 150–400)
RBC: 3.81 MIL/uL — ABNORMAL LOW (ref 3.87–5.11)
RDW: 13.5 % (ref 11.5–15.5)
WBC: 10.3 10*3/uL (ref 4.0–10.5)
nRBC: 0 % (ref 0.0–0.2)

## 2020-03-28 LAB — BASIC METABOLIC PANEL
Anion gap: 10 (ref 5–15)
BUN: 22 mg/dL (ref 8–23)
CO2: 22 mmol/L (ref 22–32)
Calcium: 8.4 mg/dL — ABNORMAL LOW (ref 8.9–10.3)
Chloride: 108 mmol/L (ref 98–111)
Creatinine, Ser: 1.12 mg/dL — ABNORMAL HIGH (ref 0.44–1.00)
GFR calc Af Amer: 56 mL/min — ABNORMAL LOW (ref >60)
GFR calc non Af Amer: 49 mL/min — ABNORMAL LOW (ref >60)
Glucose, Bld: 293 mg/dL — ABNORMAL HIGH (ref 70–99)
Potassium: 4.4 mmol/L (ref 3.5–5.1)
Sodium: 140 mmol/L (ref 135–145)

## 2020-03-28 LAB — PHOSPHORUS: Phosphorus: 2.3 mg/dL — ABNORMAL LOW (ref 2.5–4.6)

## 2020-03-28 LAB — MAGNESIUM: Magnesium: 1.8 mg/dL (ref 1.7–2.4)

## 2020-03-28 MED ORDER — HYDRALAZINE HCL 20 MG/ML IJ SOLN
INTRAMUSCULAR | Status: AC
  Filled 2020-03-28: qty 1

## 2020-03-28 MED ORDER — HYDRALAZINE HCL 20 MG/ML IJ SOLN
5.0000 mg | Freq: Once | INTRAMUSCULAR | Status: AC
  Administered 2020-03-28: 5 mg via INTRAVENOUS

## 2020-03-28 MED ORDER — AMANTADINE HCL 50 MG/5ML PO SYRP
100.0000 mg | ORAL_SOLUTION | Freq: Two times a day (BID) | ORAL | Status: DC
  Administered 2020-03-28: 100 mg via ORAL
  Filled 2020-03-28 (×2): qty 10

## 2020-03-28 NOTE — Progress Notes (Signed)
TEE 03/26/20:  1. No mass or vegetations were identified on the mitral or other valves.  No thrombus in the left atrial appendage. Negative bubble study.  2. Left ventricular ejection fraction, by estimation, is 70 to 75%. The  left ventricle has hyperdynamic function. The left ventricle has no  regional wall motion abnormalities. There is mild concentric left  ventricular hypertrophy. Left ventricular  diastolic function could not be evaluated.  3. Right ventricular systolic function is normal. The right ventricular  size is normal.  4. Left atrial size was mildly dilated. No left atrial/left atrial  appendage thrombus was detected. The LAA emptying velocity was 60 cm/s.  5. The mitral valve is normal in structure. Mild mitral valve  regurgitation. No evidence of mitral stenosis.  6. The aortic valve is normal in structure. Aortic valve regurgitation is  mild. No aortic stenosis is present.  7. There is mild (Grade II) plaque.  8. The inferior vena cava is normal in size with greater than 50%  respiratory variability, suggesting right atrial pressure of 3 mmHg.  9. Agitated saline contrast bubble study was negative, with no evidence  of any interatrial shunt.   [facility] will sign off.   Medication Recommendations:  Per primary team  Other recommendations (labs, testing, etc): N/A Follow up as an outpatient:  PRN with cardiology

## 2020-03-28 NOTE — Progress Notes (Signed)
Spoke with Dr. Otelia Limes about NIH score change greater than 4.  RN notes no changes in patient status since last assessment this a.m.; No new orders at this time, will continue to monitor patient.

## 2020-03-28 NOTE — Progress Notes (Signed)
Modified Barium Swallow Progress Note  Patient Details  [name] MRN: [id number] Date of Birth: May 10, 1946  Today's Date: 03/28/2020  Modified Barium Swallow completed.  Full report located under Chart Review in the Imaging Section.  Brief recommendations include the following:  Clinical Impression  Pt presents with inconsistent aspiration across thin and thickened liquid consistencies which appears to be related to a combination of decreased attention to boluses in the setting of lethargy and overt cognitive impairment as well as decreased oral control of boluses resulting from oral motor weakness.  Aspiration was sensed and pt did intermittently exhibit the abiltiy to clear penetrates from the airway; however, due to pt's inconsistent presentation on today's study, diet initiation is not recommended at this time.  Instead, I would suggest beginning trials of purees and nectar thick liquids via teaspoon with speech therapy only.  Prognosis for advancement is good with improving alertness and mentation.     Swallow Evaluation Recommendations       SLP Diet Recommendations: NPO;Alternative means - temporary       Medication Administration: Via alternative means               Oral Care Recommendations: Oral care QID        Maxyne Derocher, Melanee Spry 03/28/2020,2:24 PM

## 2020-03-28 NOTE — Progress Notes (Signed)
Physical Therapy Treatment Patient Details [name] MRN: [id number] DOB: Nov 26, 1945 Today's Date: 03/28/2020    History of Present Illness 74 y.o female admitted with sudden onset Lt sided weakness and surred speech.  Tpa was administered.  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 pt extubated 6/14, but had increasing lethsrgy > possibly septic due to UTI.  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  PMH includes: hypertensive retinopathy, DM, arthritis, s/p TKA, s/p ORIF Rt hip, s/p ORIF LT hip.    PT Comments    Today's skilled session continued to focus on mobility progression. Pt sleeping soundly on arrival today with increased time needed to arouse pt for participation in session- verbal and tactile stimulus needed including sternal rub. Once pt opened her eyes she was able to engage with PTA questions. Pt was quick to fatigue with session, falling asleep at edge of bed with session. Limited progress this session due pt fatigue. Acute PT to continue during pt's hospital stay.    Follow Up Recommendations  CIR     Equipment Recommendations  Hospital bed;Other (comment) (mechanical lift if d/c home)    Recommendations for Other Services Rehab consult     Precautions / Restrictions Precautions Precautions: Fall Precaution Comments: L hemi/neglect    Mobility  Bed Mobility Overal bed mobility: Needs Assistance Bed Mobility: Supine to Sit;Sit to Supine     Supine to sit: Mod assist;Max assist;+2 for physical assistance;HOB elevated;+2 for safety/equipment Sit to supine: Max assist;+2 for physical assistance;HOB elevated;+2 for safety/equipment   General bed mobility comments: once pt was able to arouse required mod/max assist of 2 for supine to edge of bed. pads under pt used to scoot hips toward edge of bed. pt with significant left push with right UE.  worked on right UE elbow propping, rocking ant/post and reaching with right UE to work on midline orientation. pt fatiguing quickly needing rest breaks by propping on rehab tech behind her. after ~10 minutes edge of bed pt began to nod off again. pt returned to supine with max/total assist of 2. rolling left and right x 1 each with max assist for pad repositioning, then total assist of 2 to scoot toward HOB. Pt left with HOB 30 degrees and all needs in reach.      Modified Rankin (Stroke Patients Only) Modified Rankin (Stroke Patients Only) Pre-Morbid Rankin Score: No symptoms Modified Rankin: Severe disability     Cognition Arousal/Alertness: Lethargic Behavior During Therapy: Flat affect Overall Cognitive Status: Impaired/Different from baseline Area of Impairment: Attention;Memory;Following commands;Safety/judgement;Awareness;Problem solving                   Current Attention Level: Sustained Memory: Decreased recall of precautions;Decreased short-term memory Following Commands: Follows one step commands with increased time;Follows one step commands inconsistently Safety/Judgement: Decreased awareness of safety;Decreased awareness of deficits Awareness: Intellectual Problem Solving: Slow processing;Requires verbal cues General Comments: pt with poor awareness of L side             Pertinent Vitals/Pain Pain Assessment: No/denies pain           PT Goals (current goals can now be found in the care plan section) Acute Rehab PT Goals Patient Stated Goal: return to independence PT Goal Formulation: With patient Time For Goal Achievement: 04/09/20 Potential to Achieve Goals: Good Progress towards PT goals: Progressing toward goals    Frequency  Min 4X/week      PT Plan Current plan remains appropriate       AM-PAC PT "6 Clicks" Mobility   Outcome Measure  Help needed turning from your back to your side while in a flat bed without using bedrails?:  Total Help needed moving from lying on your back to sitting on the side of a flat bed without using bedrails?: Total Help needed moving to and from a bed to a chair (including a wheelchair)?: Total Help needed standing up from a chair using your arms (e.g., wheelchair or bedside chair)?: Total Help needed to walk in hospital room?: Total Help needed climbing 3-5 steps with a railing? : Total 6 Click Score: 6    End of Session   Activity Tolerance: Patient limited by lethargy;Patient tolerated treatment well Patient left: in bed;with call bell/phone within reach;with bed alarm set;with family/visitor present Nurse Communication: Mobility status;Need for lift equipment PT Visit Diagnosis: Other abnormalities of gait and mobility (R26.89);Muscle weakness (generalized) (M62.81);History of falling (Z91.81);Other symptoms and signs involving the nervous system (R29.898)     Time: 2122-4825 PT Time Calculation (min) (ACUTE ONLY): 31 min  Charges:  $Therapeutic Activity: 8-22 mins $Neuromuscular Re-education: 8-22 mins                    Willow Ora, PTA, [facility] Acute Rehab Services Office- [phone] 03/28/20, 1:30 PM  Willow Ora 03/28/2020, 1:30 PM

## 2020-03-28 NOTE — Progress Notes (Signed)
STROKE TEAM PROGRESS NOTE   INTERVAL HISTORY No family at bedside. Pt drowsy sleepy and difficult arouse, however, able to answer all orientation questions with eyes closed. Able to follow simple commands on the right although slow responding. Will add amantadine.   Vitals:   03/27/20 2326 03/28/20 0145 03/28/20 0401 03/28/20 0457  BP: (!) 172/96 (!) 159/89 (!) 162/96   Pulse: (!) 109 (!) 109 (!) 104   Resp: 20 18 18    Temp: 98.1 F (36.7 C) 98.1 F (36.7 C) 99.2 F (37.3 C)   TempSrc: Oral  Oral   SpO2: 96% 98% 98%   Weight:    82.7 kg   CBC:  Recent Labs  Lab 03/23/20 2227 03/23/20 2229 03/24/20 0450 03/24/20 1527 03/27/20 0707 03/28/20 0408  WBC 9.4   < > 12.2*   < > 9.2 10.3  NEUTROABS 6.5  --  10.0*  --   --   --   HGB 13.4   < > 12.1   < > 10.4* 11.2*  HCT 40.8   < > 34.8*   < > 31.3* 33.7*  MCV 89.7   < > 87.9   < > 88.4 88.5  PLT 222   < > 224   < > 207 249   < > = values in this interval not displayed.   Basic Metabolic Panel:  Recent Labs  Lab 03/27/20 0707 03/28/20 0408  NA 140 140  K 3.4* 4.4  CL 110 108  CO2 21* 22  GLUCOSE 238* 293*  BUN 24* 22  CREATININE 1.23* 1.12*  CALCIUM 7.9* 8.4*  MG 2.1 1.8  PHOS 2.2* 2.3*   Lipid Panel:     Component Value Date/Time   CHOL 156 03/24/2020 0450   CHOL 163 11/15/2019 0859   TRIG 247 (H) 03/27/2020 0707   TRIG 456 (H) 07/03/2013 1504   HDL 27 (L) 03/24/2020 0450   HDL 33 (L) 11/15/2019 0859   HDL 36 (L) 07/03/2013 1504   CHOLHDL 5.8 03/24/2020 0450   VLDL UNABLE TO CALCULATE IF TRIGLYCERIDE OVER 400 mg/dL 03/26/2020 94/85/4627   LDLCALC UNABLE TO CALCULATE IF TRIGLYCERIDE OVER 400 mg/dL 0350 09/38/1829   LDLCALC 83 11/15/2019 0859   LDLCALC NOTES: 07/03/2013 1504   HgbA1c:  Lab Results  Component Value Date   HGBA1C 8.9 (H) 03/24/2020   Urine Drug Screen: No results found for: LABOPIA, COCAINSCRNUR, LABBENZ, AMPHETMU, THCU, LABBARB  Alcohol Level No results found for: ETH  IMAGING past 24  hours No results found.  PHYSICAL EXAM    Temp:  [97.9 F (36.6 C)-99.4 F (37.4 C)] 99.2 F (37.3 C) (06/18 0401) Pulse Rate:  [86-132] 104 (06/18 0401) Resp:  [12-26] 18 (06/18 0401) BP: (114-177)/(83-125) 162/96 (06/18 0401) SpO2:  [96 %-100 %] 98 % (06/18 0401) Weight:  [82.7 kg] 82.7 kg (06/18 0457)  General - Well nourished, well developed, drowsy sleepy.  Ophthalmologic - fundi not visualized due to noncooperation.  Cardiovascular - Regular rhythm and rate.  Neuro - drowsy sleepy, eyes closed, barely open with voice stimuli. Moaning to stimuli, however, able to answer orientation with eyes closed, able to follow simple commands on the right. Eyes right gaze preference, not crossing midline. PERRL. Not blinking to visual threat bilaterally. Left facial droop. Tongue protrusion not cooperative. LUE flaccid, mild withdraw to pain. LLE 2/5 withdraw on pain stimulation. RUE spontaneous against gravity and purposeful. RLE 3/5 on pain stimulation. Sensation, coordination and gait not tested   ASSESSMENT/PLAN Ms.  MARCIANNA DAILY is a 74 y.o. female with history of HTN, HLD, DB presenting with slurred speech, L sided weakness and ataxia. Received IV tPA 03/23/2020 at 2252. Worsening in ED. CTA confirmed distal R M1 occlusion. Taken to IR.    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 ASPECTS 10.     CTA head & neck distal R M2 occlusion w/ moderate collateral flow R MCA territory.   Cerebral angio angio w/ TICI3 revascularization R M1 occlusion   Post IR CT no ICH or mass effect   CT head 6/14 0234 no acute abnormality. Small vessel disease.   CT head 6/14 1232 R MCA territory infarct sparing basal ganglia  CT head 6/14 2159 progressive R MCA territory infarct w/ basal ganglia sparing. Hyperdense R M2  MRI  Large R MCA territory  infarct w/ petechial hemorrhage. Scattered small R PCA territory infarcts. Punctate poster L periventricular WM infarct. Old B basal ganglia and thalamic lacunes.   MRA  Reocclusion distal R M1  EEG R frontal sharp waves, generalized and lateralized R brain slowing  LE Doppler  No DVT  2D Echo EF 70-75%. No obvious source of embolus. ?Echodensity L side of MV  TEE normal MV, EF 70-75%, no PFO  Will need loop recorder before d/c to CIR  TG>500->247, Direct LDL 63.8  HgbA1c 8.9  Heparin 5000 units sq tid for VTE prophylaxis  aspirin 81 mg daily prior to admission, now on ASA 325 & Plavix 75 DAPT for 3 months and then plavix alone given right MCA reocclusion.   Therapy recommendations:  CIR. Consult placed  Disposition:  pending   Acute Respiratory Failure  Intubated for IR, left intubated d/t slow response to reversal and involuntary flapping of RUE   Extubated 6/14 followed by coffee ground emesis   Bronchial hygeine  Was on nasal trumpet -> now off  Encephalopathy Emesis post extubation  Fever, Elevated Lactate  Coffee ground emesis s/p extubation  TMax 102.8->101.7->100.7->afebrile  Hemodynamically stable  KUB w/o ileus, + hydroureteronephrosis   UA w/ WBC > 50 & UCx E coli  BCx no growth   Trop 28->45   Lactic acid 4.1->3.2->2.6->1.8  Cefepime 6/14>>6/15  Meropenem 6/15>>6/17  Hypertension  Home meds:  amlodipine 5, lisinopril 20, metoprolol 100 bid  . On Cleviprex -> off  . SBP goal now < 180 . On amlodipine 10 . Long-term BP goal 130-150 given right MCA reocclusion  Hyperlipidemia  Home meds:  pravachol 40  TG>500->247, Direct LDL 63.8, goal < 70  Resumed pravastatin 40  Add zetia 10  Continue statin and zetia at discharge  Diabetes type II Uncontrolled  Home meds:  trulicity 1.93, glimepiride 1, metformin 500 bid, ozempic 0.25-0.5  HgbA1c 8.9, goal < 7.0  Hyperglycemia -> off D5 - persists - 238->293  On  TF  CBGs  SSI  Will consult diabetic coordinator   Close PCP follow up for better DM control  Dysphagia . Secondary to stroke . NPO . Cortrak placed . TF @ 55 and NS @ 20 . Speech on board   Lethargy   Fluctuate MS prior  Add amantadine  Other Stroke Risk Factors  Advanced age  Family hx stroke (father)  Other Active Problems  Leukocytosis, WBC 12.2->14.3->11.6->9.2->10.3  AKI on CRF, Cre 1.6->1.54->1.7->1.37->1.23->1.12 renal US +B cysts, no obstruction. Plans for OP  nephrology PTA.   Hypokalemia 3.4 - supplement - 4.4  Hypophosphatemia - 2.3  Mild anemia - 11.2   Hospital day # 4  Marvel Plan, MD PhD Stroke Neurology 03/28/2020 11:44 PM      To contact Stroke Continuity provider, please refer to [email or web address]. After hours, contact General Neurology

## 2020-03-28 NOTE — Evaluation (Signed)
Speech Language Pathology Evaluation Patient Details [name] MRN: [id number] DOB: 05-19-1946 Today's Date: 03/28/2020 Time: 4580-9983 SLP Time Calculation (min) (ACUTE ONLY): 12 min  Problem List:  Patient Active Problem List   Diagnosis Date Noted  . AKI (acute kidney injury) (HCC)   . Acute ischemic right MCA stroke (HCC) 03/24/2020  . 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 Acute respiratory failure (HCC)   . Acquired hypothyroidism 08/15/2019  . Hyperlipidemia associated with type 2 diabetes mellitus (HCC) 05/14/2019  . Diabetic retinopathy (HCC) 11/09/2018  . Closed fracture of femur, intertrochanteric, right, initial encounter (HCC) 08/19/2018  . Type 2 diabetes mellitus with complication, without long-term current use of insulin (HCC) 02/15/2018  . Closed left hip fracture (HCC) 02/10/2018  . CKD stage 3 due to type 2 diabetes mellitus (HCC) 02/10/2018  . Hip fracture (HCC) 02/10/2018  . Pressure injury of skin 02/10/2018  . Arthritis of left knee 04/09/2014  . Status post total knee replacement 04/09/2014  . Arthropathy   . Hypertension associated with type 2 diabetes mellitus (HCC)   . Kidney stones   . Postablative hypothyroidism   . Degenerative arthritis of knee 06/09/2012  . Flatulence, eructation, and gas pain 12/24/2005  . Abdominal or pelvic swelling, mass, or lump, other specified site 12/24/2005  . Age-related osteoporosis with current pathological fracture with routine healing 12/04/2004  . Symptomatic menopausal or female climacteric states 12/04/2004  . Arthropathy, lower leg 04/21/2004  . Hematuria 09/17/2003  . Other long term (current) drug therapy 07/05/2002   Past Medical History:  Past Medical History:  Diagnosis Date  . Arthritis    OA AND PAIN BOTH KNEES -RIGHT KNEE HURTS WORSE THAN LEFT  . Cataract   . Diabetes mellitus    ORAL MEDICATION - NO INSULIN  . Hypertension   . Hypertensive retinopathy    OU  .  Hypothyroidism   . Kidney stones    NONE AT PRESENT TIME THAT PT IS AWARE OF  . Osteoporosis    Past Surgical History:  Past Surgical History:  Procedure Laterality Date  . ABDOMINAL HYSTERECTOMY    . BUBBLE STUDY  03/26/2020   Procedure: BUBBLE STUDY;  Surgeon: Lars Masson, MD;  Location: [facility] ENDOSCOPY;  Service: Cardiovascular;;  . CATARACT EXTRACTION    . CHOLECYSTECTOMY  1972  . COLONOSCOPY    . EYE SURGERY     RIGHT CATARACT EXTRACTON WITH LENS IMPLANT  . GROWTH REMOVED FROM THYROID    . INTRAMEDULLARY (IM) NAIL INTERTROCHANTERIC Left 02/10/2018   Procedure: INTRAMEDULLARY (IM) NAIL INTERTROCHANTRIC;  Surgeon: Kathryne Hitch, MD;  Location: [facility] OR;  Service: Orthopedics;  Laterality: Left;  . INTRAMEDULLARY (IM) NAIL INTERTROCHANTERIC Right 08/20/2018   Procedure: ORIF RIGHT HIP INTERTROC FRACTURE;  Surgeon: Kathryne Hitch, MD;  Location: [facility] OR;  Service: Orthopedics;  Laterality: Right;  . IR CT HEAD LTD  03/24/2020  . IR PERCUTANEOUS ART THROMBECTOMY/INFUSION INTRACRANIAL INC DIAG ANGIO  03/24/2020  . left hip surgery   02/2018  . ORIF HIP FRACTURE Right 08/20/2018  . PERCUTANEOUS NEPHROLITHOTOMY    . RADIOLOGY WITH ANESTHESIA N/A 03/23/2020   Procedure: IR WITH ANESTHESIA;  Surgeon: Julieanne Cotton, MD;  Location: [facility] OR;  Service: Radiology;  Laterality: N/A;  . TEE WITHOUT CARDIOVERSION N/A 03/26/2020   Procedure: TRANSESOPHAGEAL ECHOCARDIOGRAM (TEE);  Surgeon: Lars Masson, MD;  Location: [facility] ENDOSCOPY;  Service: Cardiovascular;  Laterality: N/A;  . TOTAL KNEE ARTHROPLASTY  06/09/2012   Procedure: TOTAL KNEE  ARTHROPLASTY;  Surgeon: Mcarthur Rossetti, MD;  Location: [facility] ORS;  Service: Orthopedics;  Laterality: Right;  Right Total Knee Arthroplasty  . TOTAL KNEE ARTHROPLASTY Left 04/09/2014   Procedure: LEFT TOTAL KNEE ARTHROPLASTY;  Surgeon: Mcarthur Rossetti, MD;  Location: [facility];  Service: Orthopedics;  Laterality: Left;  . TUBAL LIGATION      HPI:  74 y.o female admitted with sudden onset Lt sided weakness and surred speech.  Tpa was administered.  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 pt extubated 6/14, but had increasing lethsrgy > possibly septic due to UTI.  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     Assessment / Plan / Recommendation Clinical Impression   Pt presents with a moderate dysarthria resulting from left sided oral motor weakness which leads to imprecise articulation of consonants and decreased speech intelligibility at the short phrase level.  Pt was very lethargic during today's evaluation and she presents with decreased sustained attention to tasks which impact all higher level cognitive processes.  Pt exhibits right gaze preference as well but she was able to cross midline during functional 1 step commands.  Pt was oriented x4 (within one day for exact date).  As a result, pt would benefit from skilled ST while inpatient in order to maximize functional independence and reduce burden of care prior to discharge.  I agree that pt would benefit from CIR level therapies post acute care.  MBS scheduled for 1200 this afternoon to determine readiness for PO diet initiation.      SLP Assessment  SLP Recommendation/Assessment: Patient needs continued Speech Lanaguage Pathology Services SLP Visit Diagnosis: Dysarthria and anarthria (R47.1);Cognitive communication deficit (R41.841)    Follow Up Recommendations  Inpatient Rehab    Frequency and Duration min 3x week         SLP Evaluation Cognition  Overall Cognitive Status: Impaired/Different from baseline Arousal/Alertness: Lethargic Orientation Level: Oriented to person;Oriented to place;Oriented to situation Attention: Sustained Sustained Attention: Impaired Sustained Attention Impairment: Verbal basic;Functional basic Safety/Judgment: Impaired        Comprehension  Auditory Comprehension Overall Auditory Comprehension: Appears within functional limits for tasks assessed    Expression Expression Primary Mode of Expression: Verbal Verbal Expression Overall Verbal Expression: Appears within functional limits for tasks assessed   Oral / Motor  Oral Motor/Sensory Function Overall Oral Motor/Sensory Function: Moderate impairment Facial ROM: Reduced left Facial Symmetry: Abnormal symmetry left Facial Strength: Reduced left Lingual ROM: Reduced left Lingual Symmetry: Abnormal symmetry left Lingual Strength: Reduced Motor Speech Overall Motor Speech: Impaired Respiration: Within functional limits Phonation: Normal Resonance: Within functional limits Articulation: Impaired Level of Impairment: Phrase Intelligibility: Intelligibility reduced Phrase: 50-74% accurate Motor Planning: Witnin functional limits   GO                    Mannix Kroeker, Selinda Orion 03/28/2020, 9:07 AM

## 2020-03-28 NOTE — Progress Notes (Signed)
Inpatient Diabetes Program Recommendations  AACE/ADA: New Consensus Statement on Inpatient Glycemic Control (2015)  Target Ranges:  Prepandial:   less than 140 mg/dL      Peak postprandial:   less than 180 mg/dL (1-2 hours)      Critically ill patients:  140 - 180 mg/dL   Results for PRESLYN, Meredith Shaw (MRN [id number]) as of 03/28/2020 07:14  Ref. Range 03/26/2020 23:22 03/27/2020 03:14 03/27/2020 07:44 03/27/2020 11:22 03/27/2020 17:35 03/27/2020 20:04  Glucose-Capillary Latest Ref Range: 70 - 99 mg/dL 401 (H)  4 units NOVOLOG  223 (H)  7 units NOVOLOG  238 (H)  7 units NOVOLOG  318 (H)  15 units NOVOLOG  289 (H)  11 units NOVOLOG  286 (H)  11 units NOVOLOG +  10 units LEVEMIR   Results for [name], [name] (MRN [id number]) as of 03/28/2020 07:14  Ref. Range 03/27/2020 23:30 03/28/2020 03:53  Glucose-Capillary Latest Ref Range: 70 - 99 mg/dL 403 (H)  11 units NOVOLOG  281 (H)  11 units NOVOLOG      Home DM Meds: Trulicity 0.75 mg Qweek        Amaryl 1 mg Daily        Metformin 500 mg BID  Current Orders: Levemir 10 units QHS      Novolog Resistant Correction Scale/ SSI (0-20 units) Q4 hours    PCP: Dr. Louanne Skye with [facility]--last seen 02/21/2020--Patient was started on Trulicity 0.75 mg Qweek       MD- Note Levemir 10 units QHS started last PM.  CBG remains >250 this AM.  Note patient receiving Tube Feeds at 55cc/hr which may be contributing to elevated CBGs as well.  Please consider the following:  1. Increase Levemir to 15 units QHS (0.2 units/kg)  2. Start Novolog Tube Feed Coverage in addition to the current Novolog SSI patient is receiving to cover the Carbohydrates in the tube feedings: Novolog 4 units Q4 hours to start HOLD if tube feeds HELD for any reason      --Will follow patient during hospitalization--  Ambrose Finland RN, MSN, CDE Diabetes Coordinator Inpatient Glycemic Control Team Team Pager:  [phone] (8a-5p)

## 2020-03-29 ENCOUNTER — Inpatient Hospital Stay (HOSPITAL_COMMUNITY): Payer: Medicare Other

## 2020-03-29 LAB — CBC
HCT: 35.6 % — ABNORMAL LOW (ref 36.0–46.0)
Hemoglobin: 11.6 g/dL — ABNORMAL LOW (ref 12.0–15.0)
MCH: 29 pg (ref 26.0–34.0)
MCHC: 32.6 g/dL (ref 30.0–36.0)
MCV: 89 fL (ref 80.0–100.0)
Platelets: 305 10*3/uL (ref 150–400)
RBC: 4 MIL/uL (ref 3.87–5.11)
RDW: 13.3 % (ref 11.5–15.5)
WBC: 11.1 10*3/uL — ABNORMAL HIGH (ref 4.0–10.5)
nRBC: 0 % (ref 0.0–0.2)

## 2020-03-29 LAB — BASIC METABOLIC PANEL
Anion gap: 11 (ref 5–15)
BUN: 30 mg/dL — ABNORMAL HIGH (ref 8–23)
CO2: 22 mmol/L (ref 22–32)
Calcium: 8.6 mg/dL — ABNORMAL LOW (ref 8.9–10.3)
Chloride: 104 mmol/L (ref 98–111)
Creatinine, Ser: 1.15 mg/dL — ABNORMAL HIGH (ref 0.44–1.00)
GFR calc Af Amer: 55 mL/min — ABNORMAL LOW (ref >60)
GFR calc non Af Amer: 47 mL/min — ABNORMAL LOW (ref >60)
Glucose, Bld: 387 mg/dL — ABNORMAL HIGH (ref 70–99)
Potassium: 4.3 mmol/L (ref 3.5–5.1)
Sodium: 137 mmol/L (ref 135–145)

## 2020-03-29 LAB — PHOSPHORUS: Phosphorus: 2.4 mg/dL — ABNORMAL LOW (ref 2.5–4.6)

## 2020-03-29 LAB — GLUCOSE, CAPILLARY
Glucose-Capillary: 383 mg/dL — ABNORMAL HIGH (ref 70–99)
Glucose-Capillary: 303 mg/dL — ABNORMAL HIGH (ref 70–99)
Glucose-Capillary: 335 mg/dL — ABNORMAL HIGH (ref 70–99)
Glucose-Capillary: 367 mg/dL — ABNORMAL HIGH (ref 70–99)
Glucose-Capillary: 294 mg/dL — ABNORMAL HIGH (ref 70–99)

## 2020-03-29 LAB — CULTURE, BLOOD (ROUTINE X 2)
Culture: NO GROWTH
Special Requests: ADEQUATE

## 2020-03-29 MED ORDER — AMANTADINE HCL 50 MG/5ML PO SYRP
100.0000 mg | ORAL_SOLUTION | Freq: Two times a day (BID) | ORAL | Status: DC
  Administered 2020-03-29 – 2020-03-31 (×5): 100 mg
  Filled 2020-03-29 (×6): qty 10

## 2020-03-29 MED ORDER — INSULIN DETEMIR 100 UNIT/ML ~~LOC~~ SOLN
10.0000 [IU] | Freq: Two times a day (BID) | SUBCUTANEOUS | Status: DC
  Administered 2020-03-29 – 2020-04-01 (×7): 10 [IU] via SUBCUTANEOUS
  Filled 2020-03-29 (×8): qty 0.1

## 2020-03-29 MED ORDER — LORAZEPAM 2 MG/ML IJ SOLN
2.0000 mg | Freq: Once | INTRAMUSCULAR | Status: AC
  Administered 2020-03-29: 2 mg via INTRAVENOUS
  Filled 2020-03-29: qty 1

## 2020-03-29 NOTE — Progress Notes (Addendum)
Inpatient Diabetes Program Recommendations  AACE/ADA: New Consensus Statement on Inpatient Glycemic Control (2015)  Target Ranges:  Prepandial:   less than 140 mg/dL      Peak postprandial:   less than 180 mg/dL (1-2 hours)      Critically ill patients:  140 - 180 mg/dL   Lab Results  Component Value Date   GLUCAP 303 (H) 03/29/2020   HGBA1C 8.9 (H) 03/24/2020    Review of Glycemic Control Results for Meredith Shaw, Meredith Shaw (MRN [id number]) as of 03/29/2020 10:10  Ref. Range 03/28/2020 19:22 03/28/2020 23:31 03/29/2020 04:14 03/29/2020 07:26  Glucose-Capillary Latest Ref Range: 70 - 99 mg/dL 409 (H) 811 (H) 914 (H) 303 (H)   Home DM Meds: Trulicity 0.75 mg Qweek                              Amaryl 1 mg Daily                              Metformin 500 mg BID  Current Orders: Levemir 10 units QHS                            Novolog Resistant Correction Scale/ SSI (0-20 units) Q4 hours Vital 1.5 cal @55  ml/hr  PCP: Dr. with [facility]--last seen 02/21/2020--Patient was started on Trulicity 0.75 mg Qweek   MD- Note Levemir 10 units QHS started last PM.  CBG remains >250 this AM.  Note patient receiving Tube Feeds at 55cc/hr which may be contributing to elevated CBGs as well.  Please consider the following:  1. Increase Levemir to 10 units BID  2. Start Novolog Tube Feed Coverage in addition to the current Novolog SSI patient is receiving to cover the Carbohydrates in the tube feedings: Novolog 4 units Q4 hours to start HOLD if tube feeds HELD for any reason    Thanks, 02/23/2020, MSN, RNC-OB Diabetes Coordinator [phone] (8a-5p)

## 2020-03-29 NOTE — Progress Notes (Signed)
Inpatient Rehab Admissions:  Inpatient Rehab Consult received.  I met with pt and husband, Berneta Sages, at the bedside for rehabilitation assessment and to discuss goals and expectations of an inpatient rehab admission.  Pt appears to be an appropriate candidate for CIR. Husband acknowledged understanding of CIR goals and expectations.  Both pt and husband interested in [place].  Will begin insurance authorization for potential CIR admission.  Signed: Gayland Curry, [place], [place] Admissions Coordinator [phone]

## 2020-03-29 NOTE — PMR Pre-admission (Addendum)
PMR Admission Coordinator Pre-Admission Assessment  Patient: Meredith Shaw is an [age] y.o., female MRN: [id number] DOB: 13-Sep-1946 Height:   Weight: 83.6 kg              Insurance Information HMO: yes    PPO:      PCP:      IPA:      80/20:      OTHER:  PRIMARY: U[employer] Medicare      Policy#: [id number]      Subscriber: patient CM [name] with expedited appeal optum/[employer] medicare. Initially denied and then approved on appeal      Phone#: [phone] for [facility] continued stay     Fax#: [phone] Pre-Cert#: [id number]  Approved for 7 days  With f/u with [facility]  Employer:  Benefits:  Phone #: [phone]     Name: 6/23 Eff. Date: 01/10/2020     Deduct: none      Out of Pocket Max: $3600      Life Max: none  CIR: $295 co pay per day days 1 until 5      SNF: no copay days 1 until 20:$184 co pay per day days 21 until 40; no copay days 41 until 100 Outpatient: $30 per visit     Co-Pay: visits per medical neccesity Home Health: 100%      Co-Pay: visits per medical neccesity DME: 80%     Co-Pay: 20% Providers: in-network  SECONDARY:   none   Financial Counselor:       Phone#:   The Data processing manager"Data Collection Information Summary" for patients in Inpatient Rehabilitation Facilities with attached "Privacy Act Statement-Health Care Records" was provided and verbally reviewed with: Family  Emergency Contact Information Contact Information    Name Relation Home Work Meredith Shaw   Friberg,Meredith Shaw [phone]  [phone]   Meredith Shaw Daughter   [phone]   Meredith EatonLawrence, Meredith Shaw   [phone]     Current Medical History  Patient Admitting Diagnosis: R MCA infarct s/p TPA  History of Present Illness:   74 year old right handed with history of type 2 diabetes mellitus complicated by retinopathy and hypertension.    Presented 03/23/2020 with slurred speech and left-sided weakness.  CT of the head was negative and TPA was administered.  CTA angiogram revealed distal right MCA M1 occlusion  with moderate collateral flow and right MCA.  She underwent cerebral angiogram with revascularization per interventional radiology.  She did require short-term intubation.  Patient did have episodes of coffee-ground emesis low-grade fever worsening of mental status.  Due to concerns for urosepsis she was started on IV meropenem for a short time.  Repeat CT of the head showed progressive right MCA infarct with evidence of reocclusion.  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  Bilateral lower extremity Dopplers negative.  Echocardiogram with ejection fraction of 70-75% with possible echodensity attached to the posterior mitral valve.  TEE ordered for work-up was negative for vegetation or thrombus and bubble study negative.  EEG completed due to lethargy showed sharp waves in right frontal region and slow generalized lateralization in right hemisphere due to underlying infarct.  No definite seizure identified.  Urology was consulted 04/07/2020 Dr. Annabell HowellsWrenn for bouts of hematuria felt to be related to anticoagulation with  noted urine cultures Klebsiella and placed on Ancef.  A recent renal ultrasound was completed during hospital work-up showed no mass or hydronephrosis.  Patient had been on aspirin and Plavix for CVA prophylaxis Plavix discontinued due to bouts of hematuria.  Patient has been cleared for subcutaneous heparin for DVT prophylaxis.  Dysphagia #1 honey thick liquids and nasogastric tube removed 04/05/2020.  LOOP recorder to be placed today prior to admit to CIR.  Complete NIHSS TOTAL: 19 Glasgow Coma Scale Score: 15  Past Medical History  Past Medical History:  Diagnosis Date  . Arthritis    OA  AND PAIN BOTH KNEES -RIGHT KNEE HURTS WORSE THAN LEFT  . Cataract   . Diabetes mellitus    ORAL MEDICATION - NO INSULIN  . Hypertension   . Hypertensive retinopathy    OU  . Hypothyroidism   . Kidney stones    NONE AT PRESENT TIME THAT PT IS AWARE OF  . Osteoporosis     Family History  family history includes Breast cancer in her mother; Diabetes in her paternal grandmother; Healthy in her daughter and Shaw; Heart disease in her father and mother; Liver disease in her maternal grandmother; Pneumonia in her maternal grandfather; Stroke in her father.  Prior Rehab/Hospitalizations:  Has the patient had prior rehab or hospitalizations prior to admission? yes  Has the patient had major surgery during 100 days prior to admission? Yes  Current Medications   Current Facility-Administered Medications:  .  0.9 %  sodium chloride infusion, , Intravenous, Continuous, Marvel Plan, MD, Last Rate: 75 mL/hr at 04/11/20 0615, New Bag at 04/11/20 0615 .  0.9 %  sodium chloride infusion, , Intravenous, PRN, Marvel Plan, MD, Last Rate: 10 mL/hr at 04/10/20 0515, Rate Verify at 04/10/20 0515 .  acetaminophen (TYLENOL) tablet 650 mg, 650 mg, Oral, Q4H PRN **OR** acetaminophen (TYLENOL) 160 MG/5ML solution 650 mg, 650 mg, Per Tube, Q4H PRN, 650 mg at 03/29/20 1409 **OR** acetaminophen (TYLENOL) suppository 650 mg, 650 mg, Rectal, Q4H PRN, Lars Masson, MD, 650 mg at 03/25/20 0753 .  amantadine (SYMMETREL) 50 MG/5ML solution 150 mg, 150 mg, Oral, Daily, Marvel Plan, MD, 150 mg at 04/11/20 0515 .  amLODipine (NORVASC) tablet 10 mg, 10 mg, Oral, Daily, Marvel Plan, MD, 10 mg at 04/11/20 1002 .  aspirin EC tablet 81 mg, 81 mg, Oral, Daily, Marvel Plan, MD, 81 mg at 04/11/20 1002 .  bisacodyl (DULCOLAX) EC tablet 5 mg, 5 mg, Oral, Daily, Marvel Plan, MD, 5 mg at 04/11/20 1002 .  ceFAZolin (ANCEF) IVPB 1 g/50 mL premix, 1 g, Intravenous, Q8H, Marvel Plan, MD, Last Rate: 100 mL/hr at 04/11/20 0522, 1 g at  04/11/20 0522 .  Chlorhexidine Gluconate Cloth 2 % PADS 6 each, 6 each, Topical, Daily, Lars Masson, MD, 6 each at 04/11/20 1011 .  ezetimibe (ZETIA) tablet 10 mg, 10 mg, Oral, Daily, Marvel Plan, MD, 10 mg at 04/11/20 1002 .  heparin injection 5,000 Units, 5,000 Units, Subcutaneous, Q8H, Marvel Plan, MD, 5,000 Units at 04/11/20 0518 .  insulin aspart (novoLOG) injection 0-5 Units, 0-5 Units, Subcutaneous, QHS, Marvel Plan, MD, 3 Units at 04/09/20 2245 .  insulin aspart (novoLOG) injection 0-9 Units, 0-9 Units, Subcutaneous, TID WC, Marvel Plan, MD, 3 Units at 04/11/20 [phone] .  insulin detemir (LEVEMIR) injection 10 Units, 10 Units, Subcutaneous, BID, Biby, Sharon L, NP, 10 Units at 04/11/20 1003 .  MEDLINE mouth rinse, 15 mL, Mouth Rinse, BID, Delton See Faustino Congress, MD, 15  mL at 04/11/20 1011 .  metoprolol tartrate (LOPRESSOR) injection 2.5-5 mg, 2.5-5 mg, Intravenous, Q3H PRN, Dorothy Spark, MD, 2.5 mg at 03/28/20 0143 .  metoprolol tartrate (LOPRESSOR) tablet 25 mg, 25 mg, Oral, BID, Rosalin Hawking, MD, 25 mg at 04/11/20 1002 .  multivitamin with minerals tablet 1 tablet, 1 tablet, Oral, Daily, Garvin Fila, MD, 1 tablet at 04/11/20 1002 .  ondansetron (ZOFRAN) injection 4 mg, 4 mg, Intravenous, Q6H PRN, Dorothy Spark, MD .  pantoprazole (PROTONIX) EC tablet 40 mg, 40 mg, Oral, Daily, Rosalin Hawking, MD, 40 mg at 04/11/20 1002 .  polyethylene glycol (MIRALAX / GLYCOLAX) packet 17 g, 17 g, Oral, Daily, Rosalin Hawking, MD, 17 g at 04/11/20 1003 .  pravastatin (PRAVACHOL) tablet 40 mg, 40 mg, Oral, q1800, Rosalin Hawking, MD, 40 mg at 04/10/20 1757 .  Resource ThickenUp Clear, , Oral, PRN, Garvin Fila, MD .  senna-docusate (Senokot-S) tablet 1 tablet, 1 tablet, Oral, QHS PRN, Rosalin Hawking, MD  Patients Current Diet:  Diet Order            DIET - DYS 1 Room service appropriate? Yes with Assist; Fluid consistency: Honey Thick  Diet effective now                 Precautions /  Restrictions Precautions Precautions: Fall Precaution Comments: L hemi and neglect (getting closer to inattention vs neglect) Restrictions Weight Bearing Restrictions: No   Has the patient had 2 or more falls or a fall with injury in the past year?No  Prior Activity Level Community (5-7x/wk): driving, gets out of house to eat with husband. Sedentary  Prior Functional Level Prior Function Level of Independence: Needs assistance Gait / Transfers Assistance Needed: pt reports ambulating without assistive device ADL's / Homemaking Assistance Needed: pt requires assistance to don socks  Self Care: Did the patient need help bathing, dressing, using the toilet or eating?  Independent  Indoor Mobility: Did the patient need assistance with walking from room to room (with or without device)? Independent  Stairs: Did the patient need assistance with internal or external stairs (with or without device)? Independent  Functional Cognition: Did the patient need help planning regular tasks such as shopping or remembering to take medications? Independent  Home Assistive Devices / Equipment Home Equipment: Environmental consultant - 2 wheels, Bedside commode, Shower seat, Cane - single point, Hand held shower head, Grab bars - toilet, Grab bars - tub/shower, Wheelchair - manual  Prior Device Use: Indicate devices/aids used by the patient prior to current illness, exacerbation or injury? None of the above  Current Functional Level Cognition  Arousal/Alertness: Lethargic Overall Cognitive Status: Impaired/Different from baseline Current Attention Level: Sustained Orientation Level: Oriented X4 Following Commands: Follows one step commands with increased time, Follows one step commands consistently Safety/Judgement: Decreased awareness of deficits General Comments: continues to improve with following commands, communication, and visual tracking to midline and across midline especially during functional  task Attention: Sustained Sustained Attention: Impaired Sustained Attention Impairment: Verbal basic, Functional basic Safety/Judgment: Impaired    Extremity Assessment (includes Sensation/Coordination)  Upper Extremity Assessment: LUE deficits/detail, RUE deficits/detail RUE Deficits / Details: rhythmic uncontrollable jerking movements, pt looks as if she is tapping hand on mattress LUE Deficits / Details: flaccid, no active movement noted. Dense inattention also noted  Lower Extremity Assessment: Defer to PT evaluation RLE Deficits / Details: grossly 4-/5, PT does note tremoring of RLE at rest, intermittent rhythmic tapping of R side pt reports is unintentional RLE  Sensation: WNL LLE Deficits / Details: pt with 3-/5 R knee and hip flexion, other motion appears non-purposeful and automatic, limited AROM noted. Pt with some increase in adductor tone, PROM WFL LLE Sensation: decreased light touch (no response to pain)    ADLs  Overall ADL's : Needs assistance/impaired Eating/Feeding: NPO Eating/Feeding Details (indicate cue type and reason): coretrak Grooming: Maximal assistance, Brushing hair, Sitting Grooming Details (indicate cue type and reason): Unable to reach back of head to date Upper Body Bathing: Sitting, Maximal assistance Upper Body Bathing Details (indicate cue type and reason): Sitting EOB applying lotion, increased tactile cues to attend to L side (max A to maintain upright positioning due to decreased knowledge of midline and L neglect) Lower Body Bathing: Sitting/lateral leans, Maximal assistance, Cueing for safety, Cueing for compensatory techniques Lower Body Bathing Details (indicate cue type and reason): Sitting EOB applying lotion, increased tactile cues to attend to L side (min A to open container) Upper Body Dressing : Maximal assistance, Sitting Lower Body Dressing: Total assistance, Sitting/lateral leans, Sit to/from stand Lower Body Dressing Details (indicate  cue type and reason): recieves LB dressing assist at baseline Toilet Transfer: Maximal assistance, +2 for physical assistance, +2 for safety/equipment Toilet Transfer Details (indicate cue type and reason): stand pivot simulated with recliner Toileting- Clothing Manipulation and Hygiene: Bed level, Total assistance Toileting - Clothing Manipulation Details (indicate cue type and reason): for posterior pericare post BM Functional mobility during ADLs: Moderate assistance, Maximal assistance, +2 for physical assistance, +2 for safety/equipment General ADL Comments: pt is continuing to demonstrate improvments, with increased ability to attend to the L as well as center herself in midline or increase sitting balance outside base of support, with multimodal cues, pt able to correct with min-mod A sitting EOB before transferring to recliner    Mobility  Overal bed mobility: Needs Assistance Bed Mobility: Supine to Sit Rolling: Mod assist, Max assist Sidelying to sit: Max assist Supine to sit: Max assist, Mod assist, +2 for physical assistance Sit to supine: Total assist, +2 for physical assistance General bed mobility comments: pt assisted more with elevating trunk into sitting vs previous session; multimodal cues for sequencing and assist to bring L LE/hips to EOB and to elevate trunk     Transfers  Overall transfer level: Needs assistance Equipment used:  (+2 face to face with gait belt and bed pad ) Transfer via Lift Equipment: Stedy Transfers: Sit to/from Stand, [employer] Transfers Sit to Stand: Mod assist, +2 physical assistance Stand pivot transfers: Max assist, +2 physical assistance  Lateral/Scoot Transfers: Max assist, +2 physical assistance General transfer comment: cues for hand placement, LE positioning, and trunk flexion prior to standing and sequencing and upright posture while pivoting bed to recliner; pt requires assist to power up into standing and bed pad utilized to facilitate  hip extension; assistance to pivot both feet    Ambulation / Gait / Stairs / Wheelchair Mobility  Ambulation/Gait General Gait Details: unable    Posture / Balance Dynamic Sitting Balance Sitting balance - Comments: worked extensively on sitting balance EOB; pt able to correct to midline posture with multimodal cues; pt recognizes when she is leaning but does not correct posture without cues  Balance Overall balance assessment: Needs assistance Sitting-balance support: Single extremity supported, Feet supported, Bilateral upper extremity supported Sitting balance-Leahy Scale: Poor Sitting balance - Comments: worked extensively on sitting balance EOB; pt able to correct to midline posture with multimodal cues; pt recognizes when she is leaning but  does not correct posture without cues  Postural control: Left lateral lean, Posterior lean Standing balance support: Bilateral upper extremity supported, During functional activity Standing balance-Leahy Scale: Poor Standing balance comment: reliant on external assist    Special needs/care consideration   Designated visitor Tonnette Zwiebel, husband, Diamantina Monks and Shaw, Windy Fast Hgb A1c 8.9 Loop recorder to be placed on 7/2 prior to admit to CIR Indwelling catheter placed 6/14 MBS 6/23 recommended Dysphagia 1 with nectar thick and meds crushed with puree   Previous Home Environment  Living Arrangements: Spouse/significant other  Lives With: Spouse Available Help at Discharge: Family, Available 24 hours/day Type of Home: House Home Layout: One level Home Access: Level entry Entrance Stairs-Rails: None Entrance Stairs-Number of Steps: 2 Bathroom Shower/Tub: Health visitor: Handicapped height Bathroom Accessibility: Yes How Accessible: Accessible via wheelchair, Accessible via walker Home Care Services: No Additional Comments: DME info obtained from prior admission, needs to be confirmed  Discharge Living  Setting Plans for Discharge Living Setting: Patient's home Type of Home at Discharge: House Discharge Home Layout: One level Discharge Home Access: Level entry Discharge Bathroom Shower/Tub: Walk-in shower Discharge Bathroom Toilet: Handicapped height Discharge Bathroom Accessibility: Yes How Accessible: Accessible via wheelchair, Accessible via walker  Social/Family/Support Systems Patient Roles: Spouse Anticipated Caregiver: Donavan Burnet, daughter, Iola Turri, Shaw Anticipated Caregiver's Contact Information: [phone], [phone] Caregiver Availability: 24/7 Discharge Plan Discussed with Primary Caregiver: Yes Is Caregiver In Agreement with Plan?: Yes Does Caregiver/Family have Issues with Lodging/Transportation while Pt is in Rehab?: No  Goals Patient/Family Goal for Rehab: Min/Mod A: PT/OT, Supervision: ST Expected length of stay: 17-20 days Pt/Family Agrees to Admission and willing to participate: Yes Program Orientation Provided & Reviewed with Pt/Caregiver Including Roles  & Responsibilities: Yes  Decrease burden of Care through IP rehab admission: n/a  Possible need for SNF placement upon discharge:If patient does not meet goals, family would look into SNF  Patient Condition: This patient's medical and functional status has changed since the consult dated: 03/27/2020 in which the Rehabilitation Physician determined and documented that the patient's condition is appropriate for intensive rehabilitative care in an inpatient rehabilitation facility. See "History of Present Illness" (above) for medical update. Functional changes are: overall max assist. Patient's medical and functional status update has been discussed with the Rehabilitation physician and patient remains appropriate for inpatient rehabilitation. Will admit to inpatient rehab today.  Preadmission Screen Completed By:  Clois Dupes, RN, 04/11/2020 11:06  AM ______________________________________________________________________   Discussed status with Dr. Allena Katz on 04/11/2020 at  1108 and received approval for admission today.  Admission Coordinator: Ottie Glazier RN MSN Clois Dupes, time 9381 Date 04/11/2020

## 2020-03-29 NOTE — Progress Notes (Signed)
Stat EEG complete, Results pending  Ordered by Dr. Otelia Limes.

## 2020-03-29 NOTE — Progress Notes (Signed)
STROKE TEAM PROGRESS NOTE   INTERVAL HISTORY Daughters at the bedside.  She reports that the patient is overall unchanged although the daughter reports she seems more alert and awake today.  She was started on amantadine yesterday.  Appears the patient has had episodic but frequent movements of the right leg and right upper extremity.  This started after the onset of the patient stroke.  Vitals:   03/29/20 0500 03/29/20 0748 03/29/20 1124 03/29/20 1545  BP:  (!) 145/133 (!) 169/99 135/85  Pulse:  (!) 106 (!) 109 99  Resp:  20 16 20   Temp:  98.8 F (37.1 C) 99.2 F (37.3 C) 97.9 F (36.6 C)  TempSrc:  Oral Oral Oral  SpO2:  100% 96% 97%  Weight: 81.7 kg      CBC:  Recent Labs  Lab 03/23/20 2227 03/23/20 2229 03/24/20 0450 03/24/20 1527 03/28/20 0408 03/29/20 0437  WBC 9.4   < > 12.2*   < > 10.3 11.1*  NEUTROABS 6.5  --  10.0*  --   --   --   HGB 13.4   < > 12.1   < > 11.2* 11.6*  HCT 40.8   < > 34.8*   < > 33.7* 35.6*  MCV 89.7   < > 87.9   < > 88.5 89.0  PLT 222   < > 224   < > 249 305   < > = values in this interval not displayed.   Basic Metabolic Panel:  Recent Labs  Lab 03/27/20 0707 03/27/20 0707 03/28/20 0408 03/29/20 0437  NA 140   < > 140 137  K 3.4*   < > 4.4 4.3  CL 110   < > 108 104  CO2 21*   < > 22 22  GLUCOSE 238*   < > 293* 387*  BUN 24*   < > 22 30*  CREATININE 1.23*   < > 1.12* 1.15*  CALCIUM 7.9*   < > 8.4* 8.6*  MG 2.1  --  1.8  --   PHOS 2.2*   < > 2.3* 2.4*   < > = values in this interval not displayed.   Lipid Panel:     Component Value Date/Time   CHOL 156 03/24/2020 0450   CHOL 163 11/15/2019 0859   TRIG 247 (H) 03/27/2020 0707   TRIG 456 (H) 07/03/2013 1504   HDL 27 (Meredith) 03/24/2020 0450   HDL 33 (Meredith) 11/15/2019 0859   HDL 36 (Meredith) 07/03/2013 1504   CHOLHDL 5.8 03/24/2020 0450   VLDL UNABLE TO CALCULATE IF TRIGLYCERIDE OVER 400 mg/dL 03/26/2020 60/07/9322   LDLCALC UNABLE TO CALCULATE IF TRIGLYCERIDE OVER 400 mg/dL 5573 22/11/5425    LDLCALC 83 11/15/2019 0859   LDLCALC NOTES: 07/03/2013 1504   HgbA1c:  Lab Results  Component Value Date   HGBA1C 8.9 (H) 03/24/2020   Urine Drug Screen: No results found for: LABOPIA, COCAINSCRNUR, LABBENZ, AMPHETMU, THCU, LABBARB  Alcohol Level No results found for: ETH  IMAGING past 24 hours No results found.  PHYSICAL EXAM    Temp:  [97.9 F (36.6 C)-99.5 F (37.5 C)] 97.9 F (36.6 C) (06/19 1545) Pulse Rate:  [95-109] 99 (06/19 1545) Resp:  [16-20] 20 (06/19 1545) BP: (128-186)/(60-133) 135/85 (06/19 1545) SpO2:  [94 %-100 %] 97 % (06/19 1545) Weight:  [81.7 kg] 81.7 kg (06/19 0500)  General - Well nourished, well developed, drowsy sleepy.  Ophthalmologic - fundi not visualized due to noncooperation.  Cardiovascular - Regular  rhythm and rate.  Neuro - drowsy sleepy, eyes closed, barely open with voice stimuli. Moaning to stimuli, however, able to answer orientation with eyes closed, able to follow simple commands on the right. Eyes right gaze preference, not crossing midline. PERRL. Not blinking to visual threat bilaterally. Left facial droop. Tongue protrusion not cooperative. LUE flaccid, mild withdraw to pain. LLE 1/5 withdraw on pain stimulation. RUE spontaneous against gravity and purposeful. RLE 3/5 on pain stimulation.  She is noted to have frequent flexion movement of the right upper extremity and right leg.  It seems semirhythmic and most consistent with a periodic limb movements.  Sensation, coordination and gait not tested   ASSESSMENT/PLAN [name] is a 74 y.o. female with history of HTN, HLD, DB presenting with slurred speech, Meredith Shaw. Received IV tPA 03/23/2020 at 2252. Worsening in ED. CTA confirmed distal R M1 occlusion. Taken to IR.    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  ASPECTS 10.     CTA head & neck distal R M2 occlusion w/ moderate collateral flow R MCA territory.   Cerebral angio angio w/ TICI3 revascularization R M1 occlusion   Post IR CT no ICH or mass effect   CT head 6/14 0234 no acute abnormality. Small vessel disease.   CT head 6/14 1232 R MCA territory infarct sparing basal ganglia  CT head 6/14 2159 progressive R MCA territory infarct w/ basal ganglia sparing. Hyperdense R M2  MRI  Large R MCA territory infarct w/ petechial hemorrhage. Scattered small R PCA territory infarcts. Punctate poster Meredith periventricular WM infarct. Old B basal ganglia and thalamic lacunes.   MRA  Reocclusion distal R M1  EEG R frontal sharp waves, generalized and lateralized R brain slowing  LE Doppler  No DVT  2D Echo EF 70-75%. No obvious source of embolus. ?Echodensity Meredith side of MV  TEE normal MV, EF 70-75%, no PFO  Will need loop recorder before d/c to CIR  TG>500->247, Direct LDL 63.8  HgbA1c 8.9  Heparin 5000 units sq tid for VTE prophylaxis  aspirin 81 mg daily prior to admission, now on ASA 325 & Plavix 75 DAPT for 3 months and then plavix alone given right MCA reocclusion.   Therapy recommendations:  CIR. Consult placed  Disposition:  pending   Acute Respiratory Failure  Intubated for IR, left intubated d/t slow response to reversal and involuntary flapping of RUE   Extubated 6/14 followed by coffee ground emesis   Bronchial hygeine  Was on nasal trumpet -> now off  Encephalopathy Emesis post extubation  Fever, Elevated Lactate  Coffee ground emesis s/p extubation  TMax 102.8->101.7->100.7->afebrile  Hemodynamically stable  KUB w/o ileus, + hydroureteronephrosis   UA w/ WBC > 50 & UCx E coli  BCx no growth   Trop 28->45   Lactic acid 4.1->3.2->2.6->1.8  Cefepime 6/14>>6/15  Meropenem 6/15>>6/17  Hypertension  Home meds:  amlodipine 5, lisinopril 20, metoprolol 100 bid  . On Cleviprex -> off  . SBP goal now <  180 . On amlodipine 10 . Long-term BP goal 130-150 given right MCA reocclusion  Hyperlipidemia  Home meds:  pravachol 40  TG>500->247, Direct LDL 63.8, goal < 70  Resumed pravastatin 40  Add zetia 10  Continue statin and zetia at discharge  Diabetes type II Uncontrolled  Home  meds:  trulicity 6.62, glimepiride 1, metformin 500 bid, ozempic 0.25-0.5  HgbA1c 8.9, goal < 7.0  Hyperglycemia -> off D5 - persists - 238->293  On TF  CBGs  SSI  Diabetic coordinator consulted -> Levemir increased to Bid as per recommendation.  Close PCP follow up for better DM control  Dysphagia . Secondary to stroke . NPO . Cortrak placed . TF @ 55 and NS @ 20 . Speech on board   Lethargy   Fluctuate MS prior  Add amantadine  Other Stroke Risk Factors  Advanced age  Family hx stroke (father)  Other Active Problems  Leukocytosis, WBC 12.2->14.3->11.6->9.2->10.3->11.1  AKI on CRF, Cre 1.6->1.54->1.7->1.37->1.23->1.12->1.15 renal US +B cysts, no obstruction. Plans for OP nephrology PTA.   Hypokalemia 3.4 - supplement - 4.4->4.3  Hypophosphatemia - 2.3->2.4  Mild anemia - 11.2->11.6  Episodic movement of the right side Repeat EEG  Hospital day # 5      To contact Stroke Continuity provider, please refer to [email or web address] After hours, contact General Neurology

## 2020-03-30 DIAGNOSIS — R259 Unspecified abnormal involuntary movements: Secondary | ICD-10-CM

## 2020-03-30 LAB — CULTURE, BLOOD (ROUTINE X 2)
Culture: NO GROWTH
Special Requests: ADEQUATE

## 2020-03-30 LAB — GLUCOSE, CAPILLARY
Glucose-Capillary: 261 mg/dL — ABNORMAL HIGH (ref 70–99)
Glucose-Capillary: 284 mg/dL — ABNORMAL HIGH (ref 70–99)
Glucose-Capillary: 304 mg/dL — ABNORMAL HIGH (ref 70–99)
Glucose-Capillary: 326 mg/dL — ABNORMAL HIGH (ref 70–99)
Glucose-Capillary: 336 mg/dL — ABNORMAL HIGH (ref 70–99)
Glucose-Capillary: 338 mg/dL — ABNORMAL HIGH (ref 70–99)
Glucose-Capillary: 353 mg/dL — ABNORMAL HIGH (ref 70–99)

## 2020-03-30 LAB — PHOSPHORUS: Phosphorus: 1.5 mg/dL — ABNORMAL LOW (ref 2.5–4.6)

## 2020-03-30 MED ORDER — LISINOPRIL 20 MG PO TABS
20.0000 mg | ORAL_TABLET | Freq: Every day | ORAL | Status: DC
  Filled 2020-03-30: qty 1

## 2020-03-30 MED ORDER — SODIUM PHOSPHATES 45 MMOLE/15ML IV SOLN
30.0000 mmol | Freq: Once | INTRAVENOUS | Status: AC
  Administered 2020-03-30: 30 mmol via INTRAVENOUS
  Filled 2020-03-30: qty 10

## 2020-03-30 MED ORDER — ROPINIROLE HCL 0.25 MG PO TABS
0.2500 mg | ORAL_TABLET | Freq: Two times a day (BID) | ORAL | Status: DC
  Administered 2020-03-30 – 2020-04-04 (×11): 0.25 mg via ORAL
  Filled 2020-03-30 (×12): qty 1

## 2020-03-30 MED ORDER — LISINOPRIL 20 MG PO TABS
20.0000 mg | ORAL_TABLET | Freq: Every day | ORAL | Status: DC
  Administered 2020-03-31 – 2020-04-05 (×6): 20 mg
  Filled 2020-03-30 (×7): qty 1

## 2020-03-30 NOTE — Progress Notes (Signed)
STROKE TEAM PROGRESS NOTE   INTERVAL HISTORY The husband is at the bedside.  He reports she has been resting today.  She is still having a lot of jerky movements of the right lower extremity although the right upper extremity is better.   Vitals:   03/30/20 0447 03/30/20 0836 03/30/20 1235 03/30/20 1642  BP: (!) 163/140 (!) 189/81 (!) 149/87 131/81  Pulse: (!) 108 80 (!) 108 96  Resp: 19 20 20 18   Temp: 99.2 F (37.3 C) 98 F (36.7 C) 98.2 F (36.8 C) 98.8 F (37.1 C)  TempSrc: Oral Oral Oral Oral  SpO2: 98% 97% 98% 99%  Weight:       CBC:  Recent Labs  Lab 03/23/20 2227 03/23/20 2229 03/24/20 0450 03/24/20 1527 03/28/20 0408 03/29/20 0437  WBC 9.4   < > 12.2*   < > 10.3 11.1*  NEUTROABS 6.5  --  10.0*  --   --   --   HGB 13.4   < > 12.1   < > 11.2* 11.6*  HCT 40.8   < > 34.8*   < > 33.7* 35.6*  MCV 89.7   < > 87.9   < > 88.5 89.0  PLT 222   < > 224   < > 249 305   < > = values in this interval not displayed.   Basic Metabolic Panel:  Recent Labs  Lab 03/27/20 0707 03/27/20 0707 03/28/20 0408 03/28/20 0408 03/29/20 0437 03/30/20 1111  NA 140   < > 140  --  137  --   K 3.4*   < > 4.4  --  4.3  --   CL 110   < > 108  --  104  --   CO2 21*   < > 22  --  22  --   GLUCOSE 238*   < > 293*  --  387*  --   BUN 24*   < > 22  --  30*  --   CREATININE 1.23*   < > 1.12*  --  1.15*  --   CALCIUM 7.9*   < > 8.4*  --  8.6*  --   MG 2.1  --  1.8  --   --   --   PHOS 2.2*   < > 2.3*   < > 2.4* 1.5*   < > = values in this interval not displayed.   Lipid Panel:     Component Value Date/Time   CHOL 156 03/24/2020 0450   CHOL 163 11/15/2019 0859   TRIG 247 (H) 03/27/2020 0707   TRIG 456 (H) 07/03/2013 1504   HDL 27 (L) 03/24/2020 0450   HDL 33 (L) 11/15/2019 0859   HDL 36 (L) 07/03/2013 1504   CHOLHDL 5.8 03/24/2020 0450   VLDL UNABLE TO CALCULATE IF TRIGLYCERIDE OVER 400 mg/dL 03/26/2020 99/83/3825   LDLCALC UNABLE TO CALCULATE IF TRIGLYCERIDE OVER 400 mg/dL 0539  76/73/4193   LDLCALC 83 11/15/2019 0859   LDLCALC NOTES: 07/03/2013 1504   HgbA1c:  Lab Results  Component Value Date   HGBA1C 8.9 (H) 03/24/2020   Urine Drug Screen: No results found for: LABOPIA, COCAINSCRNUR, LABBENZ, AMPHETMU, THCU, LABBARB  Alcohol Level No results found for: [facility]  IMAGING past 24 hours  EEG adult  Result Date: 03/30/2020 04/01/2020, MD     03/30/2020  8:56 AM Patient [name] MRN: [name] Epilepsy Attending: [id number] Referring Physician/Provider: Dr Charlsie Quest Date:  03/29/2020 Duration:  22.29 mins Patient history: 73yo F with R MCA infarct, noted to have fluctuation mental status and movements of right leg concerning for seizure. EEG to evaluate for seizure Level of alertness: Awake AEDs during EEG study: None Technical aspects: This EEG study was done with scalp electrodes positioned according to the 10-20 International system of electrode placement. Electrical activity was acquired at a sampling rate of 500Hz  and reviewed with a high frequency filter of 70Hz  and a low frequency filter of 1Hz . EEG data were recorded continuously and digitally stored. Description: The posterior dominant rhythm consists of 9Hz  activity of moderate voltage (25-35 uV) seen predominantly in posterior head regions, symmetric and reactive to eye opening and eye closing. EEG showed continuous 3 to 6 Hz theta-delta slowing in right fronto-centro-temporal region. Intermittent generalized 3-5hz  theta-delta slowing was also noted Hyperventilation and photic stimulation were not performed.   During the study, patient was noted to have repeated right leg kicking movements. Concomitant eeg before, during and after the event didn't show any eeg change to suggest seizure. ABNORMALITY -Continuous slow,  Right fronto-centro-temporal region - Intermittent slow, generalized IMPRESSION: This study is suggestive of cortical dysfunction in right fronto-centro-temporal region consistent with  underlying infarct. Additionally there is evidence of mild diffuse encephalopathy, non specific etiology. No seizures or epileptiform discharges were seen throughout the recording. Multiple episodes of right leg kicking movements were captured without concomitant eeg change and were NOT epileptic Priyanka O Yadav    PHYSICAL EXAM    Temp:  [97.6 F (36.4 C)-99.2 F (37.3 C)] 98.8 F (37.1 C) (06/20 1642) Pulse Rate:  [80-108] 96 (06/20 1642) Resp:  [18-20] 18 (06/20 1642) BP: (131-189)/(81-140) 131/81 (06/20 1642) SpO2:  [95 %-100 %] 99 % (06/20 1642) Weight:  [80.8 kg] 80.8 kg (06/20 0100)  General - Well nourished, well developed, drowsy sleepy.  She is noted to have a lot of jerking of the right lower extremity.  She is snoring and the clearly has significant witnessed apneas while sleeping.  Ophthalmologic - fundi not visualized due to noncooperation.  Cardiovascular - Regular rhythm and rate.  Neuro - drowsy sleepy, eyes closed, barely open with voice stimuli. Moaning to stimuli, however, able to answer orientation with eyes closed, able to follow simple commands on the right. Eyes right gaze preference, not crossing midline. PERRL. Not blinking to visual threat bilaterally. Left facial droop. Tongue protrusion not cooperative. LUE flaccid, mild withdraw to pain. LLE 1/5 withdraw on pain stimulation. RUE spontaneous against gravity and purposeful. RLE 3/5 on pain stimulation.  She is noted to have frequent flexion movement of the right upper extremity and right leg.  It seems semirhythmic and most consistent with a periodic limb movements.  Sensation, coordination and gait not tested   ASSESSMENT/PLAN Meredith Shaw is a 74 y.o. female with history of HTN, HLD, DB presenting with slurred speech, L sided weakness and ataxia. Received IV tPA 03/23/2020 at 2252. Worsening in ED. CTA confirmed distal R M1 occlusion. Taken to IR.    Stroke:  R MCA infarct s/p tPA and IR w/ TICI3  revascularization but re-occluded on follow up imaging, infarct embolic secondary to unknown source  Code Stroke CT head No acute abnormality. ASPECTS 10.     CTA head & neck distal R M2 occlusion w/ moderate collateral flow R MCA territory.   Cerebral angio angio w/ TICI3 revascularization R M1 occlusion   Post IR CT no ICH or mass effect   CT  head 6/14 0234 no acute abnormality. Small vessel disease.   CT head 6/14 1232 R MCA territory infarct sparing basal ganglia  CT head 6/14 2159 progressive R MCA territory infarct w/ basal ganglia sparing. Hyperdense R M2  MRI  Large R MCA territory infarct w/ petechial hemorrhage. Scattered small R PCA territory infarcts. Punctate poster L periventricular WM infarct. Old B basal ganglia and thalamic lacunes.   MRA  Reocclusion distal R M1  EEG R frontal sharp waves, generalized and lateralized R brain slowing  LE Doppler  No DVT  2D Echo EF 70-75%. No obvious source of embolus. ?Echodensity L side of MV  TEE normal MV, EF 70-75%, no PFO  Will need loop recorder before d/c to CIR  TG>500->247, Direct LDL 63.8  HgbA1c 8.9  Heparin 5000 units sq tid for VTE prophylaxis  aspirin 81 mg daily prior to admission, now on ASA 325 & Plavix 75 DAPT for 3 months and then plavix alone given right MCA reocclusion.   Therapy recommendations:  CIR. Consult placed  Disposition:  pending   Acute Respiratory Failure  Intubated for IR, left intubated d/t slow response to reversal and involuntary flapping of RUE   Extubated 6/14 followed by coffee ground emesis   Bronchial hygeine  Was on nasal trumpet -> now off  Encephalopathy Emesis post extubation  Fever, Elevated Lactate  Coffee ground emesis s/p extubation  TMax 102.8->101.7->100.7->afebrile  Hemodynamically stable  KUB w/o ileus, + hydroureteronephrosis   UA w/ WBC > 50 & UCx E coli  BCx no growth   Trop 28->45   Lactic acid 4.1->3.2->2.6->1.8  Cefepime  6/14>>6/15  Meropenem 6/15>>6/17  Hypertension  Home meds:  amlodipine 5, lisinopril 20, metoprolol 100 bid  . On Cleviprex -> off  . SBP goal now < 180 . On amlodipine 10 . Long-term BP goal 130-150 given right MCA reocclusion (SBP 131 - 189 today)  Hyperlipidemia  Home meds:  pravachol 40  TG>500->247, Direct LDL 63.8, goal < 70  Resumed pravastatin 40  Add zetia 10  Continue statin and zetia at discharge  Diabetes type II Uncontrolled  Home meds:  trulicity 0.75, glimepiride 1, metformin 500 bid, ozempic 0.25-0.5  HgbA1c 8.9, goal < 7.0  Hyperglycemia -> off D5 - persists - 238->293  On TF  CBGs  SSI  Diabetic coordinator consulted -> Levemir increased to Bid as per recommendation.  Close PCP follow up for better DM control  Dysphagia . Secondary to stroke . NPO . Cortrak placed . TF @ 55 and NS @ 20 . Speech on board   Lethargy   Fluctuate MS prior  Add amantadine  Suspect the patient likely has sleep apnea.  AutoPap will be used at nighttime.  Other Stroke Risk Factors  Advanced age  Family hx stroke (father)  Other Active Problems  Leukocytosis, WBC 12.2->14.3->11.6->9.2->10.3->11.1  AKI on CRF, Cre 1.6->1.54->1.7->1.37->1.23->1.12->1.15 renal US +B cysts, no obstruction. Plans for OP nephrology PTA.   Hypokalemia 3.4 - supplement - 4.4->4.3  Hypophosphatemia - 2.3->2.4->1.5 (spoke with pharmacy - they will supplement phosphorus - recheck in AM with magnesium level))  Mild anemia - 11.2->11.6  Episodic movement of the right side Repeat EEG 03/30/20  ABNORMALITY -Continuous slow,  Right fronto-centro-temporal region - Intermittent slow, generalized IMPRESSION: This study is suggestive of cortical dysfunction in right fronto-centro-temporal region consistent with underlying infarct. Additionally there is evidence of mild diffuse encephalopathy, non specific etiology. No seizures or epileptiform discharges were seen throughout the  recording. Multiple episodes  of right leg kicking movements were captured without concomitant eeg change and were NOT epileptic I suspect this is a severe case of restless leg syndrome.  The patient will be started on Requip twice a day and a ferritin level will be checked.   Hospital day # 6      To contact Stroke Continuity provider, please refer to [email or web address] After hours, contact General Neurology

## 2020-03-30 NOTE — Progress Notes (Signed)
Physical Therapy Treatment Patient Details [name] MRN: [id number] DOB: 24-May-1946 Today's Date: 03/30/2020    History of Present Illness 74 y.o female admitted with sudden onset Lt sided weakness and surred speech.  Tpa was administered.  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 pt extubated 6/14, but had increasing lethsrgy > possibly septic due to UTI.  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  PMH includes: hypertensive retinopathy, DM, arthritis, s/p TKA, s/p ORIF Rt hip, s/p ORIF LT hip.    PT Comments    Pt presented in bed, eyes closed, minimally responsive to verbal and tactile cues. Pt able to state name and birthday and daughters name. Pt continues to not attend to left side, but was more responsive with melodic tone of voice (ie singing pt name). Pt required max(A) +2 to complete bed mobility, sit EOB and complete seated weight shift with L UE approximation. Will continue to follow acutely.    Follow Up Recommendations  CIR     Equipment Recommendations  Hospital bed;Other (comment) (mechanical lift if d/c home, other equipment TBD)    Recommendations for Other Services       Precautions / Restrictions Precautions Precautions: Fall Precaution Comments: L hemi/neglect    Mobility  Bed Mobility Overal bed mobility: Needs Assistance Bed Mobility: Supine to Sit;Sit to Supine Rolling: Max assist   Supine to sit: HOB elevated;+2 for physical assistance;Max assist Sit to supine: Max assist;+2 for physical assistance;HOB elevated   General bed mobility comments: Pt required max(A) +2 to roll to bilateral sides to change pads. Pt required max(A)+2 to transfer to EOB with assist for Bil LE and trunk. Able to approximate LUE and complete weight shfiting in seated max(A).  Transfers                 General transfer comment:  unable  Ambulation/Gait             General Gait Details: unable   Stairs             Wheelchair Mobility    Modified Rankin (Stroke Patients Only) Modified Rankin (Stroke Patients Only) Pre-Morbid Rankin Score: No symptoms Modified Rankin: Severe disability     Balance Overall balance assessment: Needs assistance Sitting-balance support: Single extremity supported;Bilateral upper extremity supported Sitting balance-Leahy Scale: Poor Sitting balance - Comments: max(A) to sit EOB and maintain sitting, able to approximate LUE for brief peroids of time with weight shifting (pt had difficulty remainng out of flexor tone) Postural control: Left lateral lean   Standing balance-Leahy Scale: Zero                              Cognition Arousal/Alertness: Lethargic Behavior During Therapy: Flat affect Overall Cognitive Status: Impaired/Different from baseline Area of Impairment: Attention;Memory;Following commands;Safety/judgement;Awareness;Problem solving                   Current Attention Level: Sustained Memory: Decreased recall of precautions;Decreased short-term memory Following Commands: Follows one step commands with increased time;Follows one step commands inconsistently Safety/Judgement: Decreased awareness of safety;Decreased awareness of deficits Awareness: Intellectual Problem Solving: Slow processing;Requires verbal cues General Comments: pt with poor awareness of L side, required verbal and tactile cues to attend to left side, pt was unable to attend to left side despite cueing. Pt struggled to keep eyes opne during session,  appeared to respond to change in tone of voice (ie more responsive to melodic tones, maybe music could assist next session?)      Exercises Other Exercises Other Exercises: Weight shifting side to side with LUE approximated    General Comments General comments (skin integrity, edema, etc.): VSS on RA, pt appeared  to become slightly more alert with more of a melodic tone of voice (ie singing name). Pt had difficulty staying out of UE flexor synergy during session      Pertinent Vitals/Pain Faces Pain Scale: Hurts a little bit Pain Location: With moving L UE and L LE Pain Descriptors / Indicators: Grimacing Pain Intervention(s): Limited activity within patient's tolerance;Monitored during session    Home Living                      Prior Function            PT Goals (current goals can now be found in the care plan section) Acute Rehab PT Goals Time For Goal Achievement: 04/09/20 Potential to Achieve Goals: Good Progress towards PT goals: Progressing toward goals    Frequency    Min 4X/week      PT Plan Current plan remains appropriate    Co-evaluation              AM-PAC PT "6 Clicks" Mobility   Outcome Measure  Help needed turning from your back to your side while in a flat bed without using bedrails?: Total Help needed moving from lying on your back to sitting on the side of a flat bed without using bedrails?: Total Help needed moving to and from a bed to a chair (including a wheelchair)?: Total Help needed standing up from a chair using your arms (e.g., wheelchair or bedside chair)?: Total Help needed to walk in hospital room?: Total Help needed climbing 3-5 steps with a railing? : Total 6 Click Score: 6    End of Session Equipment Utilized During Treatment: Oxygen Activity Tolerance: Patient limited by lethargy;Patient tolerated treatment well Patient left: in bed;with call bell/phone within reach;with bed alarm set Nurse Communication: Mobility status       Time: 6269-4854 PT Time Calculation (min) (ACUTE ONLY): 27 min  Charges:  $Therapeutic Activity: 8-22 mins $Neuromuscular Re-education: 8-22 mins                     Sanjuana Letters SPT 03/30/2020    Sanjuana Letters 03/30/2020, 5:30 PM

## 2020-03-30 NOTE — Procedures (Addendum)
Patient [name]  MRN: [id number]  Epilepsy Attending: Charlsie Quest  Referring Physician/Provider: Dr Beryle Beams Date: 03/29/2020 Duration:  22.29 mins  Patient history: 73yo F with R MCA infarct, noted to have fluctuation mental status and movements of right leg concerning for seizure. EEG to evaluate for seizure  Level of alertness: Awake  AEDs during EEG study: None  Technical aspects: This EEG study was done with scalp electrodes positioned according to the 10-20 International system of electrode placement. Electrical activity was acquired at a sampling rate of 500Hz  and reviewed with a high frequency filter of 70Hz  and a low frequency filter of 1Hz . EEG data were recorded continuously and digitally stored.   Description: The posterior dominant rhythm consists of 9Hz  activity of moderate voltage (25-35 uV) seen predominantly in posterior head regions, symmetric and reactive to eye opening and eye closing. EEG showed continuous 3 to 6 Hz theta-delta slowing in right fronto-centro-temporal region. Intermittent generalized 3-5hz  theta-delta slowing was also noted Hyperventilation and photic stimulation were not performed.     During the study, patient was noted to have repeated right leg kicking movements. Concomitant eeg before, during and after the event didn't show any eeg change to suggest seizure.   ABNORMALITY -Continuous slow,  Right fronto-centro-temporal region - Intermittent slow, generalized  IMPRESSION: This study is suggestive of cortical dysfunction in right fronto-centro-temporal region consistent with underlying infarct. Additionally there is evidence of mild diffuse encephalopathy, non specific etiology. No seizures or epileptiform discharges were seen throughout the recording.  Multiple episodes of right leg kicking movements were captured without concomitant eeg change and were NOT epileptic     Meredith Shaw 

## 2020-03-31 LAB — GLUCOSE, CAPILLARY
Glucose-Capillary: 279 mg/dL — ABNORMAL HIGH (ref 70–99)
Glucose-Capillary: 294 mg/dL — ABNORMAL HIGH (ref 70–99)
Glucose-Capillary: 299 mg/dL — ABNORMAL HIGH (ref 70–99)
Glucose-Capillary: 303 mg/dL — ABNORMAL HIGH (ref 70–99)
Glucose-Capillary: 329 mg/dL — ABNORMAL HIGH (ref 70–99)
Glucose-Capillary: 334 mg/dL — ABNORMAL HIGH (ref 70–99)
Glucose-Capillary: 368 mg/dL — ABNORMAL HIGH (ref 70–99)

## 2020-03-31 LAB — MAGNESIUM: Magnesium: 2 mg/dL (ref 1.7–2.4)

## 2020-03-31 LAB — FERRITIN: Ferritin: 129 ng/mL (ref 11–307)

## 2020-03-31 LAB — PHOSPHORUS: Phosphorus: 4.6 mg/dL (ref 2.5–4.6)

## 2020-03-31 MED ORDER — INSULIN ASPART 100 UNIT/ML ~~LOC~~ SOLN
4.0000 [IU] | SUBCUTANEOUS | Status: DC
  Administered 2020-03-31 – 2020-04-03 (×17): 4 [IU] via SUBCUTANEOUS

## 2020-03-31 MED ORDER — AMANTADINE HCL 50 MG/5ML PO SYRP
150.0000 mg | ORAL_SOLUTION | ORAL | Status: DC
  Administered 2020-03-31 – 2020-04-05 (×11): 150 mg
  Filled 2020-03-31 (×13): qty 15

## 2020-03-31 NOTE — Progress Notes (Signed)
STROKE TEAM PROGRESS NOTE   INTERVAL HISTORY The husband and daughter is at the bedside. She is very sleepy following "therapy" administered by the daughter.  Vital signs are stable.   Vitals:   03/30/20 1946 03/30/20 2349 03/31/20 0409 03/31/20 0807  BP: (!) 169/80 (!) 168/78 (!) 169/86 (!) 111/56  Pulse: (!) 102 99  90  Resp: 18 20 20 20   Temp: 99.1 F (37.3 C) 98.2 F (36.8 C) 98.5 F (36.9 C) 99 F (37.2 C)  TempSrc: Axillary Axillary Axillary Oral  SpO2: 93% 92% 99% 95%  Weight:       CBC:  Recent Labs  Lab 03/28/20 0408 03/29/20 0437  WBC 10.3 11.1*  HGB 11.2* 11.6*  HCT 33.7* 35.6*  MCV 88.5 89.0  PLT 249 196   Basic Metabolic Panel:  Recent Labs  Lab 03/28/20 0408 03/28/20 0408 03/29/20 0437 03/29/20 0437 03/30/20 1111 03/31/20 0454  NA 140  --  137  --   --   --   K 4.4  --  4.3  --   --   --   CL 108  --  104  --   --   --   CO2 22  --  22  --   --   --   GLUCOSE 293*  --  387*  --   --   --   BUN 22  --  30*  --   --   --   CREATININE 1.12*  --  1.15*  --   --   --   CALCIUM 8.4*  --  8.6*  --   --   --   MG 1.8  --   --   --   --  2.0  PHOS 2.3*   < > 2.4*   < > 1.5* 4.6   < > = values in this interval not displayed.    IMAGING past 24 hours No results found.  PHYSICAL EXAM     General - Well nourished, well developed, drowsy sleepy.   Marland Kitchen  Ophthalmologic - fundi not visualized due to noncooperation.  Cardiovascular - Regular rhythm and rate.  Neuro - drowsy sleepy, eyes closed, barely open with voice stimuli. Moaning to stimuli, however, able to answer orientation with eyes closed, able to follow simple commands on the right. Eyes right gaze preference, not crossing midline. PERRL. Not blinking to visual threat bilaterally. Left facial droop. Tongue protrusion not cooperative. LUE flaccid, mild withdraw to pain. LLE 1/5 withdraw on pain stimulation. RUE spontaneous against gravity and purposeful. RLE 3/5 on pain stimulation.  She is noted  to have frequent flexion movement of the right upper extremity and right leg.  It seems semirhythmic and most consistent with a periodic limb movements.  Sensation, coordination and gait not tested   ASSESSMENT/PLAN [name] is a 74 y.o. female with history of HTN, HLD, DB presenting with slurred speech, L sided weakness and ataxia. Received IV tPA 03/23/2020 at 2252. Worsening in ED. CTA confirmed distal R M1 occlusion. Taken to IR.    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 ASPECTS 10.     CTA head & neck distal R M2 occlusion w/ moderate collateral flow R MCA territory.   Cerebral angio angio w/ TICI3 revascularization R M1 occlusion   Post IR CT no  ICH or mass effect   CT head 6/14 0234 no acute abnormality. Small vessel disease.   CT head 6/14 1232 R MCA territory infarct sparing basal ganglia  CT head 6/14 2159 progressive R MCA territory infarct w/ basal ganglia sparing. Hyperdense R M2  MRI  Large R MCA territory infarct w/ petechial hemorrhage. Scattered small R PCA territory infarcts. Punctate poster L periventricular WM infarct. Old B basal ganglia and thalamic lacunes.   MRA  Reocclusion distal R M1  EEG R frontal sharp waves, generalized and lateralized R brain slowing  LE Doppler  No DVT  2D Echo EF 70-75%. No obvious source of embolus. ?Echodensity L side of MV  TEE normal MV, EF 70-75%, no PFO  Plan loop recorder before d/c to CIR  TG>500->247, Direct LDL 63.8  HgbA1c 8.9  Heparin 5000 units sq tid for VTE prophylaxis  aspirin 81 mg daily prior to admission, now on ASA 325 & Plavix 75 DAPT for 3 months and then plavix alone given right MCA reocclusion.   Therapy recommendations:  CIR  Disposition:  pending   Acute Respiratory Failure  Intubated for IR, left intubated d/t slow response to  reversal and involuntary flapping of RUE   Extubated 6/14 followed by coffee ground emesis   Bronchial hygeine  Was on nasal trumpet -> now off  Encephalopathy Emesis post extubation  Fever, Elevated Lactate  Coffee ground emesis s/p extubation  TMax 102.8->101.7->100.7->afebrile  Hemodynamically stable  KUB w/o ileus, + hydroureteronephrosis   UA w/ WBC > 50 & UCx E coli  BCx no growth   Trop 28->45   Lactic acid 4.1->3.2->2.6->1.8  Cefepime 6/14>>6/15  Meropenem 6/15>>6/17  keflex 6/17>>6/21  Hypertension  Home meds:  amlodipine 5, lisinopril 20, metoprolol 100 bid  . On Cleviprex -> off  . SBP goal now < 180 . On amlodipine 10 . Long-term BP goal 130-150 given right MCA reocclusion (SBP 131 - 189 today)  Hyperlipidemia  Home meds:  pravachol 40  TG>500->247, Direct LDL 63.8, goal < 70  Resumed pravastatin 40  Add zetia 10  Continue statin and zetia at discharge  Diabetes type II Uncontrolled  Home meds:  trulicity 0.75, glimepiride 1, metformin 500 bid, ozempic 0.25-0.5  HgbA1c 8.9, goal < 7.0  On TF  CBGs  SSI  Diabetic coordinator following  On Levemir Bid and 4u q4h on TF  Close PCP follow up for better DM control  Dysphagia . Secondary to stroke . NPO . Cortrak placed . TF @ 55 and NS @ 20 . Speech on board   Lethargy   Fluctuate MS prior  Continues to have increased lethargy  Increased amantadine to 150 bid - altered times to 0600 & 1400  Suspect the patient likely has sleep apnea.  AutoPap will be used at nighttime. (pt refused)  Other Stroke Risk Factors  Advanced age  Family hx stroke (father)  Other Active Problems  Leukocytosis, WBC 11.1  AKI on CRF, Cre 1.15 renal US +B cysts, no obstruction. Plans for OP nephrology PTA.   Hypokalemia 3.4 - supplement - 4.3  Hypophosphatemia - 2.3->2.4->1.5   Mild anemia - 11.2->11.6  Episodic movement of the right side Repeat EEG 03/30/20   ABNORMALITY -Continuous slow,  Right fronto-centro-temporal region - Intermittent slow, generalized IMPRESSION: This study is suggestive of cortical dysfunction in right fronto-centro-temporal region consistent with underlying infarct. Additionally there is evidence of mild diffuse encephalopathy, non specific etiology. No seizures or epileptiform discharges were seen throughout the  recording. Multiple episodes of right leg kicking movements were captured without concomitant eeg change and were NOT epileptic Suspected severe case of restless leg syndrome.  started on Requip twice a day. ferritin level normal  Hospital day # 7 Plan increase dose of amantadine to 150 mg twice daily to improve wakefulness and hopeful greater participation with therapy so she can go to rehab over the next few days.  Will need loop recorder placed at the time of transfer to rehab.  Discussed with patient and family and answered questions.  Greater than 50% time during this 25-minute visit was spent on counseling and coordination of care about her stroke and sleepiness and answering questions Delia Heady, MD To contact Stroke Continuity provider, please refer to [email or web address]. After hours, contact General Neurology

## 2020-03-31 NOTE — Progress Notes (Signed)
Inpatient Diabetes Program Recommendations  AACE/ADA: New Consensus Statement on Inpatient Glycemic Control (2015)  Target Ranges:  Prepandial:   less than 140 mg/dL      Peak postprandial:   less than 180 mg/dL (1-2 hours)      Critically ill patients:  140 - 180 mg/dL   Lab Results  Component Value Date   GLUCAP 334 (H) 03/31/2020   HGBA1C 8.9 (H) 03/24/2020    Review of Glycemic Control Results for Meredith Shaw, Meredith Shaw (MRN [id number]) as of 03/31/2020 11:40  Ref. Range 03/30/2020 16:58 03/30/2020 20:43 03/30/2020 23:47 03/31/2020 03:28 03/31/2020 04:06 03/31/2020 08:16 03/31/2020 11:25  Glucose-Capillary Latest Ref Range: 70 - 99 mg/dL 696 (H) 789 (H) 381 (H) 294 (H) 299 (H) 279 (H) 334 (H)   Home DM Meds:Trulicity 0.75 mg Qweek Amaryl 1 mg Daily Metformin 500 mg BID  Current Orders:Levemir 10 units QHS Novolog Resistant Correction Scale/ SSI (0-20 units)Q4 hours Vital 1.5 cal @55  ml/hr  PCP: Dr. with [facility]--last seen 02/21/2020--Patient was started on Trulicity 0.75 mg Qweek  Inpatient Diabetes Program Recommendations:     Novolog 4 units Q4H for tube feed coverage.  Stop if feeds are held or discontinued.  Will continue to follow while inpatient.  Thank you, 02/23/2020, RN, BSN Diabetes Coordinator Inpatient Diabetes Program [phone] (team pager from 8a-5p)

## 2020-03-31 NOTE — Progress Notes (Signed)
Pt refused cpap for the night. RT will continue to monitor as needed. 

## 2020-03-31 NOTE — Progress Notes (Signed)
Occupational Therapy Treatment Patient Details [name] MRN: [id number] DOB: 05-29-1946 Today's Date: 03/31/2020    History of present illness 74 y.o female admitted with sudden onset Lt sided weakness and surred speech.  Tpa was administered.  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 pt extubated 6/14, but had increasing lethsrgy > possibly septic due to UTI.  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  PMH includes: hypertensive retinopathy, DM, arthritis, s/p TKA, s/p ORIF Rt hip, s/p ORIF LT hip.   OT comments  Pt seen in conjunction with PT to maximize activity tolerance and participation. Pt continues to present with L hemi/ neglect, impaired balance, decreased activity tolerance, and LUE  Flexor tone impacting pts ability to engage in BADLs. Pt drowsy upon arrival but did participate in session appropriately nodding and communicating with therapists. Pt with R gaze preference unable to track to midline despite MAX multimodal cues. Pt required MAX A +2 for bed mobility and total A +2 to sit<>stand to stedy and progress OOB. Continue to recommend CIR for DC, will follow acutely per POC.    Follow Up Recommendations  CIR    Equipment Recommendations  Wheelchair (measurements OT);Wheelchair cushion (measurements OT);Hospital bed    Recommendations for Other Services      Precautions / Restrictions Precautions Precautions: Fall Precaution Comments: L hemi/neglect Restrictions Weight Bearing Restrictions: No       Mobility Bed Mobility Overal bed mobility: Needs Assistance Bed Mobility: Supine to Sit Rolling: Max assist;+2 for physical assistance;+2 for safety/equipment   Supine to sit: HOB elevated;+2 for physical assistance;Max assist     General bed mobility comments: Max +2 for supine>sit for trunk and LE management, scooting to EOB, and posterior  truncal support as pt with heavy posterior leaning.  Transfers Overall transfer level: Needs assistance Equipment used: Ambulation equipment used Transfers: Sit to/from Stand Sit to Stand: Total assist;+2 physical assistance;+2 safety/equipment         General transfer comment: Total +2 for sit to stand for power up with use of bed pads, trunk elevation, hip extension sufficient enough to place stedy flaps down. Pt required max assist to right posture once sitting in stedy, pt with heavy anterior and L lateral lean that pt had difficult time correcting.    Balance Overall balance assessment: Needs assistance Sitting-balance support: Single extremity supported;Feet supported Sitting balance-Leahy Scale: Poor Sitting balance - Comments: EOB sitting x10 minutes, WB through LUE briefly with PT assist but pt limited by flexor synergy. weight shifting, anterior pelvic tilts and chest opening facilitation x 5 repetitions in sitting Postural control: Left lateral lean;Posterior lean   Standing balance-Leahy Scale: Zero                             ADL either performed or assessed with clinical judgement   ADL Overall ADL's : Needs assistance/impaired         Upper Body Bathing: Total assistance;Sitting Upper Body Bathing Details (indicate cue type and reason): to wash back EOB             Toilet Transfer: Total assistance;+2 for physical assistance;+2 for safety/equipment Toilet Transfer Details (indicate cue type and reason): simulated via functionalmobility via stedy; total A +2 to stand to stedy and total A for sitting balance as pt presenting with poor sitting posture in stedy  Functional mobility during ADLs: Maximal assistance;+2 for physical assistance;+2 for safety/equipment;Total assistance (sit<>stand to stedy) General ADL Comments: pt continues to present with L hemi/ neglect, impaired balance, decreased activity tolerance, decreased LUE ROM and  strength impacting pts ability to engage in BADLs     Vision Baseline Vision/History: Wears glasses Wears Glasses: At all times Patient Visual Report: Other (comment) (R gaze preference; uanable to track to midline despite MAX multimodal cues)     Perception     Praxis      Cognition Arousal/Alertness: Lethargic (periods of lethargy, periods of wakefulness and engagement) Behavior During Therapy: Flat affect Overall Cognitive Status: Impaired/Different from baseline Area of Impairment: Attention;Memory;Following commands;Safety/judgement;Awareness;Problem solving                   Current Attention Level: Sustained Memory: Decreased recall of precautions;Decreased short-term memory Following Commands: Follows one step commands with increased time Safety/Judgement: Decreased awareness of safety;Decreased awareness of deficits Awareness: Emergent Problem Solving: Slow processing;Requires verbal cues General Comments: Pt able to state location, name, and is aware of CVA. Pt with L neglect, eyes do not cross midline and PT/OT could not get pt to attend to L even with cuing. Pt requires multimodal, short cuing to participate in mobility, but participates well this day. Pt with periods of smiling, states her favorite country singer is [place].        Exercises     Shoulder Instructions       General Comments husband present at start of session    Pertinent Vitals/ Pain       Pain Assessment: No/denies pain Faces Pain Scale: No hurt Pain Intervention(s): Limited activity within patient's tolerance;Monitored during session  Home Living                                          Prior Functioning/Environment              Frequency  Min 2X/week        Progress Toward Goals  OT Goals(current goals can now be found in the care plan section)  Progress towards OT goals: Progressing toward goals  Acute Rehab OT Goals Patient Stated  Goal: return to independence Time For Goal Achievement: 04/09/20 Potential to Achieve Goals: Good  Plan Discharge plan remains appropriate;Frequency remains appropriate    Co-evaluation      Reason for Co-Treatment: Complexity of the patient's impairments (multi-system involvement);For patient/therapist safety;Necessary to address cognition/behavior during functional activity;To address functional/ADL transfers PT goals addressed during session: Mobility/safety with mobility;Balance;Proper use of DME OT goals addressed during session: ADL's and self-care      AM-PAC OT "6 Clicks" Daily Activity     Outcome Measure   Help from another person eating meals?: Total Help from another person taking care of personal grooming?: A Lot Help from another person toileting, which includes using toliet, bedpan, or urinal?: Total Help from another person bathing (including washing, rinsing, drying)?: Total Help from another person to put on and taking off regular upper body clothing?: A Lot Help from another person to put on and taking off regular lower body clothing?: Total 6 Click Score: 8    End of Session Equipment Utilized During Treatment: Other (comment) (stedy)  OT Visit Diagnosis: Unsteadiness on feet (R26.81);Other abnormalities of gait and mobility (R26.89);Other symptoms and signs involving the nervous system (R29.898);Other symptoms and signs involving cognitive  function;Hemiplegia and hemiparesis Hemiplegia - Right/Left: Left Hemiplegia - dominant/non-dominant: Non-Dominant Hemiplegia - caused by: Cerebral infarction   Activity Tolerance Patient tolerated treatment well;Patient limited by lethargy   Patient Left in chair;with call bell/phone within reach;with chair alarm set   Nurse Communication Mobility status;Need for lift equipment        Time: 4034-7425 OT Time Calculation (min): 34 min  Charges: OT General Charges $OT Visit: 1 Visit OT Treatments $Therapeutic  Activity: 8-22 mins  Audery Amel., COTA/L Acute Rehabilitation Services [phone]    Angelina Pih 03/31/2020, 3:23 PM

## 2020-03-31 NOTE — Progress Notes (Signed)
Physical Therapy Treatment Patient Details [name] MRN: [id number] DOB: 1946/07/30 Today's Date: 03/31/2020    History of Present Illness 74 y.o female admitted with sudden onset Lt sided weakness and surred speech.  Tpa was administered.  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 pt extubated 6/14, but had increasing lethsrgy > possibly septic due to UTI.  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  PMH includes: hypertensive retinopathy, DM, arthritis, s/p TKA, s/p ORIF Rt hip, s/p ORIF LT hip.    PT Comments    Pt drowsy upon PT arrival to room, but awakens and engages with calling pt name and conversation. Pt requires max-total +2 for bed mobility and transfer OOB at this time, but responds well to verbal and tactile cuing for participation in mobility. PT/OT opted to use stedy this session for transfer, but pt with very poor truncal control and struggled to maintain upright once secured in stedy. Pt with significant L neglect, pt unable to cross midline with eyes at this time even with max cuing. PT focused session on seated balance with dynamic activities including weight shifting, anterior pelvic tilts facilitated by sheet along pt hips, and WB through LUE although difficult due to flexor synergy. PT continuing to recommend CIR post-acutely, will continue to follow.     Follow Up Recommendations  CIR     Equipment Recommendations  Hospital bed;Other (comment) (mechanical lift if d/c home, other equipment TBD)    Recommendations for Other Services       Precautions / Restrictions Precautions Precautions: Fall Precaution Comments: L hemi/neglect Restrictions Weight Bearing Restrictions: No    Mobility  Bed Mobility Overal bed mobility: Needs Assistance Bed Mobility: Supine to Sit Rolling: Max assist;+2 for physical assistance;+2 for safety/equipment          General bed mobility comments: Max +2 for supine>sit for trunk and LE management, scooting to EOB, and posterior truncal support as pt with heavy posterior leaning.  Transfers Overall transfer level: Needs assistance Equipment used: Ambulation equipment used Transfers: Sit to/from Stand Sit to Stand: Total assist;+2 physical assistance;+2 safety/equipment         General transfer comment: Total +2 for sit to stand for power up with use of bed pads, trunk elevation, hip extension sufficient enough to place stedy flaps down. Pt required max assist to right posture once sitting in stedy, pt with heavy anterior and L lateral lean that pt had difficult time correcting.  Ambulation/Gait             General Gait Details: unable   Stairs             Wheelchair Mobility    Modified Rankin (Stroke Patients Only) Modified Rankin (Stroke Patients Only) Pre-Morbid Rankin Score: No symptoms Modified Rankin: Severe disability     Balance Overall balance assessment: Needs assistance Sitting-balance support: Single extremity supported;Feet supported Sitting balance-Leahy Scale: Poor Sitting balance - Comments: EOB sitting x10 minutes, WB through LUE briefly with PT assist but pt limited by flexor synergy. weight shifting, anterior pelvic tilts and chest opening facilitation x 5 repetitions in sitting Postural control: Left lateral lean;Posterior lean   Standing balance-Leahy Scale: Zero                              Cognition Arousal/Alertness: Lethargic (periods of lethargy, periods of wakefulness and  engagement) Behavior During Therapy: Flat affect Overall Cognitive Status: Impaired/Different from baseline Area of Impairment: Attention;Memory;Following commands;Safety/judgement;Awareness;Problem solving                   Current Attention Level: Sustained Memory: Decreased recall of precautions;Decreased short-term memory Following Commands:  Follows one step commands with increased time Safety/Judgement: Decreased awareness of safety;Decreased awareness of deficits Awareness: Emergent Problem Solving: Slow processing;Requires verbal cues General Comments: Pt able to state location, name, and is aware of CVA. Pt with L neglect, eyes do not cross midline and PT/OT could not get pt to attend to L even with cuing. Pt requires multimodal, short cuing to participate in mobility, but participates well this day. Pt with periods of smiling, states her favorite country singer is carrie underwood.      Exercises      General Comments        Pertinent Vitals/Pain Pain Assessment: No/denies pain Faces Pain Scale: No hurt Pain Intervention(s): Limited activity within patient's tolerance;Monitored during session    Home Living                      Prior Function            PT Goals (current goals can now be found in the care plan section) Acute Rehab PT Goals PT Goal Formulation: With patient Time For Goal Achievement: 04/09/20 Potential to Achieve Goals: Good Progress towards PT goals: Progressing toward goals    Frequency    Min 4X/week      PT Plan Current plan remains appropriate    Co-evaluation PT/OT/SLP Co-Evaluation/Treatment: Yes Reason for Co-Treatment: For patient/therapist safety;To address functional/ADL transfers;Necessary to address cognition/behavior during functional activity PT goals addressed during session: Mobility/safety with mobility;Balance;Proper use of DME        AM-PAC PT "6 Clicks" Mobility   Outcome Measure  Help needed turning from your back to your side while in a flat bed without using bedrails?: Total Help needed moving from lying on your back to sitting on the side of a flat bed without using bedrails?: Total Help needed moving to and from a bed to a chair (including a wheelchair)?: Total Help needed standing up from a chair using your arms (e.g., wheelchair or bedside  chair)?: Total Help needed to walk in hospital room?: Total Help needed climbing 3-5 steps with a railing? : Total 6 Click Score: 6    End of Session Equipment Utilized During Treatment: Oxygen;Gait belt Activity Tolerance: Patient limited by fatigue;Patient tolerated treatment well Patient left: with call bell/phone within reach;in chair;with chair alarm set Nurse Communication: Mobility status;Need for lift equipment (wrong lift pad in room, will need maximove) PT Visit Diagnosis: Other abnormalities of gait and mobility (R26.89);Muscle weakness (generalized) (M62.81);History of falling (Z91.81);Other symptoms and signs involving the nervous system (R29.898)     Time: 9604-5409 PT Time Calculation (min) (ACUTE ONLY): 34 min  Charges:  $Neuromuscular Re-education: 8-22 mins                     Apollos Tenbrink E, PT Acute Rehabilitation Services Pager [phone]  Office [phone]   Sinahi Knights D Despina Hidden 03/31/2020, 3:06 PM

## 2020-03-31 NOTE — Progress Notes (Signed)
Pt refuses CPAP for the night.  

## 2020-04-01 ENCOUNTER — Encounter (HOSPITAL_COMMUNITY): Payer: Self-pay | Admitting: Neurology

## 2020-04-01 LAB — CBC
HCT: 38.2 % (ref 36.0–46.0)
Hemoglobin: 12.4 g/dL (ref 12.0–15.0)
MCH: 29.2 pg (ref 26.0–34.0)
MCHC: 32.5 g/dL (ref 30.0–36.0)
MCV: 89.9 fL (ref 80.0–100.0)
Platelets: 450 10*3/uL — ABNORMAL HIGH (ref 150–400)
RBC: 4.25 MIL/uL (ref 3.87–5.11)
RDW: 13.5 % (ref 11.5–15.5)
WBC: 15.6 10*3/uL — ABNORMAL HIGH (ref 4.0–10.5)
nRBC: 0 % (ref 0.0–0.2)

## 2020-04-01 LAB — GLUCOSE, CAPILLARY
Glucose-Capillary: 227 mg/dL — ABNORMAL HIGH (ref 70–99)
Glucose-Capillary: 241 mg/dL — ABNORMAL HIGH (ref 70–99)
Glucose-Capillary: 255 mg/dL — ABNORMAL HIGH (ref 70–99)
Glucose-Capillary: 274 mg/dL — ABNORMAL HIGH (ref 70–99)
Glucose-Capillary: 303 mg/dL — ABNORMAL HIGH (ref 70–99)
Glucose-Capillary: 323 mg/dL — ABNORMAL HIGH (ref 70–99)

## 2020-04-01 LAB — BASIC METABOLIC PANEL
Anion gap: 12 (ref 5–15)
BUN: 47 mg/dL — ABNORMAL HIGH (ref 8–23)
CO2: 24 mmol/L (ref 22–32)
Calcium: 9.4 mg/dL (ref 8.9–10.3)
Chloride: 107 mmol/L (ref 98–111)
Creatinine, Ser: 1.51 mg/dL — ABNORMAL HIGH (ref 0.44–1.00)
GFR calc Af Amer: 39 mL/min — ABNORMAL LOW (ref >60)
GFR calc non Af Amer: 34 mL/min — ABNORMAL LOW (ref >60)
Glucose, Bld: 207 mg/dL — ABNORMAL HIGH (ref 70–99)
Potassium: 4.6 mmol/L (ref 3.5–5.1)
Sodium: 143 mmol/L (ref 135–145)

## 2020-04-01 MED ORDER — PRO-STAT SUGAR FREE PO LIQD
30.0000 mL | Freq: Every day | ORAL | Status: DC
  Administered 2020-04-02 – 2020-04-05 (×4): 30 mL
  Filled 2020-04-01 (×5): qty 30

## 2020-04-01 MED ORDER — JEVITY 1.2 CAL PO LIQD
1000.0000 mL | ORAL | Status: DC
  Administered 2020-04-01 – 2020-04-03 (×3): 1000 mL
  Filled 2020-04-01 (×3): qty 1000

## 2020-04-01 MED ORDER — INSULIN DETEMIR 100 UNIT/ML ~~LOC~~ SOLN
15.0000 [IU] | Freq: Two times a day (BID) | SUBCUTANEOUS | Status: DC
  Administered 2020-04-01 – 2020-04-03 (×4): 15 [IU] via SUBCUTANEOUS
  Filled 2020-04-01 (×5): qty 0.15

## 2020-04-01 MED ORDER — FREE WATER
100.0000 mL | Status: DC
  Administered 2020-04-01 – 2020-04-06 (×25): 100 mL

## 2020-04-01 NOTE — Progress Notes (Signed)
  Speech Language Pathology Treatment: Dysphagia  Patient Details [name] MRN: [id number] DOB: [birth year] Today's Date: 04/01/2020 Time: 2778-2423 SLP Time Calculation (min) (ACUTE ONLY): 19 min  Assessment / Plan / Recommendation Clinical Impression  Pt with improved alertness and participation today.  Oriented to person, elements of time except date, and place.  Verbalizes that Meredith Shaw has had a stroke, and when queried about deficits present as a result, Meredith Shaw waved her right hand toward her left side; unable to identify specifics of impact of stroke upon function.  Required max verbal/visual cues to track to midline - unable to cross.  Speech clarity appears to have improved as a function of her LOA.  Oral care provided, pt given single ice chips and two teaspoons of applesauce.  Meredith Shaw presented with inattention to left buccal cavity with residue post-swallow; multiple sub-swallows per bolus; mild throat-clearing intermittently. Required verbal/visual cues to attend to left side of mouth and to initiate an effortful swallow.  Given improved MS and participation, recommend repeating MBS next date.  D/W her dtr, [name] and Charity fundraiser.  Pending study tomorrow, continue NPO/cortrak.  Allow occasional ice chips after oral care.   HPI HPI: Meredith Shaw admitted with sudden onset Lt sided weakness and slurred speech.  Tpa was administered.  CTA revealed M1 occlusion and Meredith Shaw underwent thrombectomy. Pt extubated 6/14, but had increasing lethargy > possibly septic due to UTI.  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 MBS 6/18 showed intermittent aspiration across consistencies; NPO was recommended and cortrak was placed.         SLP Plan  Continue with current plan of care       Recommendations  Diet recommendations: NPO                Oral Care Recommendations: Oral care QID;Oral care prior to  ice chip/H20 Follow up Recommendations: Inpatient Rehab SLP Visit Diagnosis: Dysphagia, oropharyngeal phase (R13.12) Plan: Continue with current plan of care       GO                Blenda Mounts Laurice 04/01/2020, 10:59 AM  Marchelle Folks L. Samson Frederic, MA CCC/SLP Acute Rehabilitation Services Office number [phone] Pager [phone]

## 2020-04-01 NOTE — Progress Notes (Signed)
Physical Therapy Treatment Patient Details [name] MRN: [id number] DOB: Apr 09, 1946 Today's Date: 04/01/2020    History of Present Illness 74 y.o female admitted with sudden onset Lt sided weakness and surred speech.  Tpa was administered.  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 pt extubated 6/14, but had increasing lethsrgy > possibly septic due to UTI.  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  PMH includes: hypertensive retinopathy, DM, arthritis, s/p TKA, s/p ORIF Rt hip, s/p ORIF LT hip.    PT Comments    Pt with increased periods of awake and alert time this session. Pt continuing to require max-total +2 for bed mobility and transfer OOB, but progressed to standing with use of tray table for UE propping with tolerance for stand ~10 seconds before fatiguing. Pt up in chair with lift pad placed, pt's daughter Helene Kelp in room expressing strong desire for pt to maximize functional recovery for eventual d/c home. PT to continue to follow acutely.    Follow Up Recommendations  CIR     Equipment Recommendations  Hospital bed;Other (comment) (mechanical lift if d/c home, other equipment TBD)    Recommendations for Other Services       Precautions / Restrictions Precautions Precautions: Fall Precaution Comments: L hemi/neglect Restrictions Weight Bearing Restrictions: No    Mobility  Bed Mobility Overal bed mobility: Needs Assistance Bed Mobility: Supine to Sit     Supine to sit: Max assist;+2 for physical assistance;HOB elevated     General bed mobility comments: max +2 for trunk and LE management, scooting to EOB with use of bed pads and lateral weight shifting, posterior truncal support. Increased time and effort to perform, with heavy posterior leaning throughout requiring at least mod assist  to correct.  Transfers Overall transfer level: Needs  assistance   Transfers: Sit to/from Stand;Lateral/Scoot Transfers Sit to Stand: +2 physical assistance;+2 safety/equipment;From elevated surface;Max assist        Lateral/Scoot Transfers: Total assist;+2 physical assistance;+2 safety/equipment General transfer comment: Max +2 for power up, bilateral LE blocking, trunk elevation, and hip extension to neutral. Stand x2, first attempt unsuccessful and trunk elevation to upright. Second attempt improved with use of tray table for UE propping (stabilized by daughter) with tolerance for stand x10 seconds. Scoot pivot to drop arm recliner towards R with total +2 for hip and truncal translation, positioning in recliner.  Ambulation/Gait             General Gait Details: unable   Stairs             Wheelchair Mobility    Modified Rankin (Stroke Patients Only) Modified Rankin (Stroke Patients Only) Pre-Morbid Rankin Score: No symptoms Modified Rankin: Severe disability     Balance Overall balance assessment: Needs assistance Sitting-balance support: Single extremity supported;Feet supported Sitting balance-Leahy Scale: Poor Sitting balance - Comments: EOB sitting x10 minutes total, requires at least mod assist to correct posterior leaning. Improved seated balance with R hand placed on knee Postural control: Left lateral lean;Posterior lean   Standing balance-Leahy Scale: Zero Standing balance comment: total +2 for standing                            Cognition Arousal/Alertness: Lethargic (periods of wakefulness and drowsniess) Behavior During Therapy: Flat affect Overall Cognitive Status: Impaired/Different from baseline Area of Impairment: Attention;Memory;Following commands;Safety/judgement;Awareness;Problem solving  Current Attention Level: Sustained Memory: Decreased recall of precautions;Decreased short-term memory Following Commands: Follows one step commands with increased  time Safety/Judgement: Decreased awareness of safety;Decreased awareness of deficits Awareness: Emergent Problem Solving: Slow processing;Requires verbal cues General Comments: Pt awake and alert upon arrival to room, waxing/waning cognitive engagement throughout session tending to be more lethargic with fatigue. Pt responds appropriately to verbal commands/questions.      Exercises General Exercises - Lower Extremity Heel Slides: PROM;Left;10 reps;Supine (limited by knee extensor spasticity)    General Comments General comments (skin integrity, edema, etc.): pt's daughter [name] present and supportive      Pertinent Vitals/Pain Pain Assessment: Faces Faces Pain Scale: Hurts a little bit Pain Location: generalized, during transfer Pain Descriptors / Indicators: Grimacing Pain Intervention(s): Limited activity within patient's tolerance;Monitored during session;Repositioned    Home Living                      Prior Function            PT Goals (current goals can now be found in the care plan section) Acute Rehab PT Goals Patient Stated Goal: go home - per daughter PT Goal Formulation: With patient/family Time For Goal Achievement: 04/09/20 Potential to Achieve Goals: Good Progress towards PT goals: Progressing toward goals    Frequency    Min 4X/week      PT Plan Current plan remains appropriate    Co-evaluation PT/OT/SLP Co-Evaluation/Treatment: Yes Reason for Co-Treatment: For patient/therapist safety;To address functional/ADL transfers;Necessary to address cognition/behavior during functional activity;Complexity of the patient's impairments (multi-system involvement) PT goals addressed during session: Mobility/safety with mobility;Balance;Strengthening/ROM        AM-PAC PT "6 Clicks" Mobility   Outcome Measure  Help needed turning from your back to your side while in a flat bed without using bedrails?: Total Help needed moving from lying on your  back to sitting on the side of a flat bed without using bedrails?: Total Help needed moving to and from a bed to a chair (including a wheelchair)?: Total Help needed standing up from a chair using your arms (e.g., wheelchair or bedside chair)?: Total Help needed to walk in hospital room?: Total Help needed climbing 3-5 steps with a railing? : Total 6 Click Score: 6    End of Session Equipment Utilized During Treatment: Gait belt Activity Tolerance: Patient limited by fatigue Patient left: with call bell/phone within reach;in chair;with chair alarm set;with family/visitor present Nurse Communication: Mobility status;Need for lift equipment (lift pad placed under pt) PT Visit Diagnosis: Other abnormalities of gait and mobility (R26.89);Muscle weakness (generalized) (M62.81);History of falling (Z91.81);Other symptoms and signs involving the nervous system (R29.898)     Time: 3419-3790 PT Time Calculation (min) (ACUTE ONLY): 38 min  Charges:  $Therapeutic Activity: 8-22 mins                    Norrin Shreffler E, PT Acute Rehabilitation Services Pager [phone]  Office [phone]    Tyrone Apple D Despina Hidden 04/01/2020, 12:46 PM

## 2020-04-01 NOTE — Progress Notes (Signed)
Nutrition Follow-up  DOCUMENTATION CODES:   Not applicable  INTERVENTION:  Via Cortrak: -Jevity 1.2 cal @ 91m/hr (14470m -3073mro-stat daily -100m20mee water Q4H  Tube feeding regimen will provide 1828 kcal, 95 grams of protein, 1162ml86me water (1762ml 75ml free water with flushes)  NUTRITION DIAGNOSIS:   Increased nutrient needs related to acute illness as evidenced by estimated needs.  Ongoing.  GOAL:   Patient will meet greater than or equal to 90% of their needs  Met with TF.   MONITOR:   Labs, Weight trends  REASON FOR ASSESSMENT:   Consult Enteral/tube feeding initiation and management  ASSESSMENT:   Pt admitted with acute R MCA infarct s/p TPA and IR revascularization and possible MV vegetation. PMH includes DM, HTN, and hypothyroidism.  6/13 intubated 6/14 extubated 6/16 Cortrak placed (gastric)  Pt unavailable at time of RD visit. Discussed pt with RN.   TF: Vital 1.5 cal @ 55ml/h10m0ml Pr20mat BID  UOP: 3000ml x2489mrs I/O: -2383.8ml since21mmit  Labs: CBGs [phone] (Diabetes Coordinator following) Medications: Colace, Novolog, Levemir, Protonix, Miralax IVF: NS @ 20ml/hr  D29mOrder:   Diet Order    None      EDUCATION NEEDS:   No education needs have been identified at this time  Skin:  Skin Assessment: Skin Integrity Issues: Skin Integrity Issues:: Incisions Incisions: R groin (6/14)  Last BM:  6/19  Height:   Ht Readings from Last 1 Encounters:  02/21/20 5' 7"  (1.702 m)    Weight:   Wt Readings from Last 1 Encounters:  03/30/20 80.8 kg    BMI:  Body mass index is 27.9 kg/m.  Estimated Nutritional Needs:   Kcal:  1750-1950  4432-4699 90-105 grams  Fluid:  >1.7L/d    Marylou Wages AverLarkin InaDN RD pager number and weekend/on-call pager number located in [email or web address]

## 2020-04-01 NOTE — Progress Notes (Signed)
Inpatient Rehabilitation Admissions Coordinator  I met with patient at bedside with her RN, daughter, Helene Kelp and spouse. Daughter concerned that patient's Requip meds are making her sleepier. Asking for MD to discuss with her Mom's problems with lethargy. I await further progress with therapy to assist in determining if Rehab vs SNF most appropriate. Family is aware that final dispo is not yet determined.  Danne Baxter, RN, MSN Rehab Admissions Coordinator [phone] 04/01/2020 12:44 PM

## 2020-04-01 NOTE — Progress Notes (Addendum)
Inpatient Diabetes Program Recommendations  AACE/ADA: New Consensus Statement on Inpatient Glycemic Control (2015)  Target Ranges:  Prepandial:   less than 140 mg/dL      Peak postprandial:   less than 180 mg/dL (1-2 hours)      Critically ill patients:  140 - 180 mg/dL   Lab Results  Component Value Date   GLUCAP 241 (H) 04/01/2020   HGBA1C 8.9 (H) 03/24/2020    Review of Glycemic Control Results for Meredith Shaw, SOUDERS (MRN [id number]) as of 04/01/2020 07:20  Ref. Range 03/31/2020 11:25 03/31/2020 16:01 03/31/2020 20:11 03/31/2020 23:48 04/01/2020 03:36  Glucose-Capillary Latest Ref Range: 70 - 99 mg/dL 762 (H) 831 (H) 517 (H) 303 (H) 241 (H)   Current diabetes medications: Novolog 0-20 units q4h  Novolog 4 units q4h tube feed coverage Levemir 10 units bid Vital 1.5 @55ml /hr  Inpatient Diabetes Program Recommendations:     Levemir 15 units bid  Will continue to follow while inpatient.  Thank you, , RN, BSN Diabetes Coordinator Inpatient Diabetes Program [phone] (team pager from 8a-5p)

## 2020-04-01 NOTE — Progress Notes (Signed)
Occupational Therapy Treatment Patient Details [name] MRN: [id number] DOB: Nov 03, 1945 Today's Date: 04/01/2020    History of present illness 74 y.o female admitted with sudden onset Lt sided weakness and surred speech.  Tpa was administered.  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 pt extubated 6/14, but had increasing lethsrgy > possibly septic due to UTI.  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  PMH includes: hypertensive retinopathy, DM, arthritis, s/p TKA, s/p ORIF Rt hip, s/p ORIF LT hip.   OT comments  Pt seen in conjunction with PT to facilitate maximum participation and activity tolerance. Pt appeared more awake upon arrival, however noted to become less engaged with fatigue. Pt continues to present with L neglect/ hemi and generalized weakness needing MAX A +2 for bed mobility. Attempted sit<>stand x2 from EOB with MAX A +2. Pt required MAX A to elevate trunk and facilitate hip extension. Utilized tray to elevate trunk with daughter stabilizing table. Could try eva walker next session to facilitate upright posture? Pt completed lateral scoot to recliner with total A +2. Continue to recommend CIR, will follow.   Follow Up Recommendations  CIR    Equipment Recommendations  Wheelchair (measurements OT);Wheelchair cushion (measurements OT);Hospital bed    Recommendations for Other Services      Precautions / Restrictions Precautions Precautions: Fall Precaution Comments: L hemi/neglect Restrictions Weight Bearing Restrictions: No       Mobility Bed Mobility Overal bed mobility: Needs Assistance Bed Mobility: Supine to Sit     Supine to sit: Max assist;+2 for physical assistance;HOB elevated     General bed mobility comments: max +2 for trunk and LE management, scooting to EOB with use of bed pads and lateral weight shifting, posterior truncal  support. Increased time and effort to perform, with heavy posterior leaning throughout requiring at least mod assist  to correct.  Transfers Overall transfer level: Needs assistance Equipment used: 2 person hand held assist Transfers: Sit to/from Stand;Lateral/Scoot Transfers Sit to Stand: +2 physical assistance;+2 safety/equipment;From elevated surface;Max assist        Lateral/Scoot Transfers: Total assist;+2 physical assistance;+2 safety/equipment General transfer comment: Max +2 for power up, bilateral LE blocking, trunk elevation, and hip extension to neutral. Stand x2, first attempt unsuccessful and trunk elevation to upright. Second attempt improved with use of tray table for UE propping (stabilized by daughter) with tolerance for stand x10 seconds. Scoot pivot to drop arm recliner towards R with total +2 for hip and truncal translation, positioning in recliner.    Balance Overall balance assessment: Needs assistance Sitting-balance support: Single extremity supported;Feet supported Sitting balance-Leahy Scale: Poor Sitting balance - Comments: EOB sitting x10 minutes total, requires at least mod assist to correct posterior leaning. Improved seated balance with R hand placed on knee Postural control: Left lateral lean;Posterior lean   Standing balance-Leahy Scale: Zero Standing balance comment: total +2 for standing                           ADL either performed or assessed with clinical judgement   ADL Overall ADL's : Needs assistance/impaired                         Toilet Transfer: Total assistance;+2 for physical assistance;+2 for safety/equipment Toilet Transfer Details (indicate cue type and reason): simulated via functional mobility with lateral  scoot transfer to recliner; total A +2         Functional mobility during ADLs: +2 for physical assistance;+2 for safety/equipment;Total assistance (sit<>stand only) General ADL Comments: pt continues to  require total A +2 to complete sit<>stand from EOB, pt unable to elevate trunk in standing despite MAX cues. pt continues to present with impaired balance, L hemi and neglect, and decreased activity toleracne impacting pts ability to engage in BADLs     Vision Baseline Vision/History: Wears glasses Wears Glasses: At all times Patient Visual Report: Other (comment) (L neglect; noted to attempt to turn head to L this session but requires max cues to track to midline)     Perception     Praxis      Cognition Arousal/Alertness: Lethargic (periods of wakefulness and drowsniess) Behavior During Therapy: Flat affect Overall Cognitive Status: Impaired/Different from baseline Area of Impairment: Attention;Memory;Following commands;Safety/judgement;Awareness;Problem solving                   Current Attention Level: Sustained Memory: Decreased recall of precautions;Decreased short-term memory Following Commands: Follows one step commands with increased time Safety/Judgement: Decreased awareness of safety;Decreased awareness of deficits Awareness: Emergent Problem Solving: Slow processing;Requires verbal cues General Comments: Pt awake and alert upon arrival to room, waxing/waning cognitive engagement throughout session tending to be more lethargic with fatigue. Pt responds appropriately to verbal commands/questions. noted to state "home" when asked by daughter what her goal was        Exercises General Exercises - Lower Extremity Heel Slides: PROM;Left;10 reps;Supine (limited by knee extensor spasticity) Other Exercises Other Exercises: PROM to LUE with flexor tone noted   Shoulder Instructions       General Comments periods of wakefulness and drowsniess    Pertinent Vitals/ Pain       Pain Assessment: Faces Faces Pain Scale: Hurts a little bit Pain Location: generalized, during transfer Pain Descriptors / Indicators: Grimacing Pain Intervention(s): Limited activity within  patient's tolerance;Monitored during session;Repositioned  Home Living                                          Prior Functioning/Environment              Frequency  Min 2X/week        Progress Toward Goals  OT Goals(current goals can now be found in the care plan section)  Progress towards OT goals: Progressing toward goals  Acute Rehab OT Goals Patient Stated Goal: go home - per daughter OT Goal Formulation: With patient Time For Goal Achievement: 04/09/20 Potential to Achieve Goals: Good  Plan Discharge plan remains appropriate;Frequency remains appropriate    Co-evaluation    PT/OT/SLP Co-Evaluation/Treatment: Yes Reason for Co-Treatment: Complexity of the patient's impairments (multi-system involvement);For patient/therapist safety;To address functional/ADL transfers;Necessary to address cognition/behavior during functional activity PT goals addressed during session: Mobility/safety with mobility;Balance;Strengthening/ROM OT goals addressed during session: ADL's and self-care      AM-PAC OT "6 Clicks" Daily Activity     Outcome Measure   Help from another person eating meals?: Total Help from another person taking care of personal grooming?: A Lot Help from another person toileting, which includes using toliet, bedpan, or urinal?: Total Help from another person bathing (including washing, rinsing, drying)?: Total Help from another person to put on and taking off regular upper body clothing?: A Lot Help from another person to put  on and taking off regular lower body clothing?: Total 6 Click Score: 8    End of Session Equipment Utilized During Treatment: Gait belt  OT Visit Diagnosis: Unsteadiness on feet (R26.81);Other abnormalities of gait and mobility (R26.89);Other symptoms and signs involving the nervous system (R29.898);Other symptoms and signs involving cognitive function;Hemiplegia and hemiparesis Hemiplegia - Right/Left:  Left Hemiplegia - dominant/non-dominant: Non-Dominant Hemiplegia - caused by: Cerebral infarction   Activity Tolerance Patient tolerated treatment well;Patient limited by lethargy   Patient Left in chair;with call bell/phone within reach;with chair alarm set   Nurse Communication Mobility status;Need for lift equipment        Time: 1056-1130 OT Time Calculation (min): 34 min  Charges: OT General Charges $OT Visit: 1 Visit OT Treatments $Therapeutic Activity: 8-22 mins  Lanier Clam., COTA/L Acute Rehabilitation Services [phone] [place] 04/01/2020, 1:41 PM

## 2020-04-01 NOTE — Progress Notes (Signed)
STROKE TEAM PROGRESS NOTE   INTERVAL HISTORY Her daughter and pts husband are at the bedside.  Patient more arousable arousable today.  Blood sugars elevated with CBG 241.  White count is elevated 15.6 but no fever  Vitals:   03/31/20 2019 03/31/20 2349 04/01/20 0734 04/01/20 1011  BP: (!) 151/78 133/77 124/66 (!) 123/96  Pulse: 88 84 87   Resp: 20 19 18    Temp: (!) 97.3 F (36.3 C) 98.1 F (36.7 C) 98 F (36.7 C)   TempSrc: Oral Oral Oral   SpO2: 95% 95% 94%   Weight:       CBC:  Recent Labs  Lab 03/29/20 0437 04/01/20 0554  WBC 11.1* 15.6*  HGB 11.6* 12.4  HCT 35.6* 38.2  MCV 89.0 89.9  PLT 305 450*   Basic Metabolic Panel:  Recent Labs  Lab 03/28/20 0408 03/28/20 0408 03/29/20 0437 03/29/20 0437 03/30/20 1111 03/31/20 0454 04/01/20 0554  NA 140   < > 137  --   --   --  143  K 4.4   < > 4.3  --   --   --  4.6  CL 108   < > 104  --   --   --  107  CO2 22   < > 22  --   --   --  24  GLUCOSE 293*   < > 387*  --   --   --  207*  BUN 22   < > 30*  --   --   --  47*  CREATININE 1.12*   < > 1.15*  --   --   --  1.51*  CALCIUM 8.4*   < > 8.6*  --   --   --  9.4  MG 1.8  --   --   --   --  2.0  --   PHOS 2.3*   < > 2.4*   < > 1.5* 4.6  --    < > = values in this interval not displayed.    IMAGING past 24 hours No results found.  PHYSICAL EXAM      General - Well nourished, well developed, drowsy sleepy.   04/03/20  Ophthalmologic - fundi not visualized due to noncooperation.  Cardiovascular - Regular rhythm and rate.  Neuro - drowsy sleepy, eyes closed, barely open with voice stimuli. Moaning to stimuli, however, able to answer orientation with eyes closed, able to follow simple commands on the right. Eyes right gaze preference, not crossing midline. PERRL. Not blinking to visual threat bilaterally. Left facial droop. Tongue protrusion not cooperative. LUE flaccid, mild withdraw to pain. LLE 1/5 withdraw on pain stimulation. RUE spontaneous against gravity and  purposeful. RLE 3/5 on pain stimulation.  She is noted to have frequent flexion movement of the right upper extremity and right leg.  It seems semirhythmic and most consistent with a periodic limb movements.  Sensation, coordination and gait not tested   ASSESSMENT/PLAN [name] is a 74 y.o. female with history of HTN, HLD, DB presenting with slurred speech, L sided weakness and ataxia. Received IV tPA 03/23/2020 at 2252. Worsening in ED. CTA confirmed distal R M1 occlusion. Taken to IR.    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 ASPECTS 10.     CTA head & neck distal  R M2 occlusion w/ moderate collateral flow R MCA territory.   Cerebral angio angio w/ TICI3 revascularization R M1 occlusion   Post IR CT no ICH or mass effect   CT head 6/14 0234 no acute abnormality. Small vessel disease.   CT head 6/14 1232 R MCA territory infarct sparing basal ganglia  CT head 6/14 2159 progressive R MCA territory infarct w/ basal ganglia sparing. Hyperdense R M2  MRI  Large R MCA territory infarct w/ petechial hemorrhage. Scattered small R PCA territory infarcts. Punctate poster L periventricular WM infarct. Old B basal ganglia and thalamic lacunes.   MRA  Reocclusion distal R M1  EEG R frontal sharp waves, generalized and lateralized R brain slowing  LE Doppler  No DVT  2D Echo EF 70-75%. No obvious source of embolus. ?Echodensity L side of MV  TEE normal MV, EF 70-75%, no PFO  Plan loop recorder before d/c to CIR  TG>500->247, Direct LDL 63.8  HgbA1c 8.9  Heparin 5000 units sq tid for VTE prophylaxis  aspirin 81 mg daily prior to admission, now on ASA 325 & Plavix 75 DAPT for 3 months and then plavix alone given right MCA reocclusion.   Therapy recommendations:  CIR vs SNF  Disposition:  pending   Acute Respiratory Failure,  resolved  Intubated for IR, left intubated d/t slow response to reversal and involuntary flapping of RUE   Extubated 6/14 followed by coffee ground emesis   Bronchial hygeine  Was on nasal trumpet -> now off  Encephalopathy Emesis post extubation  Fever, Elevated Lactate  Coffee ground emesis s/p extubation  TMax  afebrile  Hemodynamically stable  KUB w/o ileus, + hydroureteronephrosis   UA w/ WBC > 50 & UCx E coli  BCx no growth   Trop 28->45   Lactic acid normal  Cefepime 6/14>>6/15  Meropenem 6/15>>6/17  keflex 6/17>>6/21  Hypertension  Home meds:  amlodipine 5, lisinopril 20, metoprolol 100 bid  . On Cleviprex -> off  . SBP goal now < 180 . On amlodipine 10 . Long-term BP goal 130-150 given right MCA reocclusion (SBP 131 - 189 today)  Hyperlipidemia  Home meds:  pravachol 40  TG>500->247, Direct LDL 63.8, goal < 70  Resumed pravastatin 40  Add zetia 10  Continue statin and zetia at discharge  Diabetes type II Uncontrolled  Home meds:  trulicity 1.61, glimepiride 1, metformin 500 bid, ozempic 0.25-0.5  HgbA1c 8.9, goal < 7.0  On TF  CBGs  SSI  Diabetic coordinator following  increase Levemir to 15u Bid and 4u q4h on TF  Close PCP follow up for better DM control  Dysphagia . Secondary to stroke . NPO . Cortrak placed . TF @ 55 and NS @ 20 . Speech on board   Lethargy   Fluctuate MS prior  Continues to have increased lethargy  Increased amantadine to 150 bid 6/21 - altered times to 0600 & 1400 -  Suspect the patient likely has sleep apnea.  AutoPap will be used at nighttime. (pt refused)  More awake today  Other Stroke Risk Factors  Advanced age  Family hx stroke (father)  Other Active Problems  Leukocytosis, WBC 11.1->15.6   AKI on CRF, Cre 1.51 renal US +B cysts, no obstruction. Plans for OP nephrology PTA.   Hypokalemia 3.4 - supplement - 4.6 - resolved  Hypophosphatemia - 2.3->2.4->1.5   Mild anemia -  11.2->11.6->12.4  Suspected severe case of restless leg syndrome.  started on Requip twice a day. ferritin  level normal  Hospital day # 8  Patient is more arousable today.  She has elevated white count but is not febrile.  If she spikes we will need to reculture.  Continue amantadine for arousal and Requip for restless legs.  Long discussion with patient and husband and daughter and answered questions.  Greater than 50% time during this 25-minute visit was spent in counseling and coordination of care about a stroke and answering questions   Meredith Heady, MD To contact Stroke Continuity provider, please refer to [email or web address]. After hours, contact General Neurology

## 2020-04-01 NOTE — Consult Note (Addendum)
ELECTROPHYSIOLOGY CONSULT NOTE  Patient ID: NUSAYBA CADENAS MRN: [id number], DOB/AGE: 1946-08-23   Admit date: 03/23/2020 Date of Consult: 04/11/2020   Primary Physician: Chevis Pretty, [place] Primary Cardiologist: No primary care provider on file.  Primary Electrophysiologist: New to None  Reason for Consultation: Cryptogenic stroke; recommendations regarding Implantable Loop Recorder Insurance: U[employer] Medicare  History of Present Illness EP has been asked to evaluate Meredith Shaw for placement of an implantable loop recorder to monitor for atrial fibrillation by Dr Erlinda Hong.  The patient was admitted on 03/23/2020 with slurred speech, L sided weakness, and ataxia. Received IV tPA. Worsening in ED. CTA confirmed distal R M1 occlusion.IR performed TICI3 revascularization, but re-occluded on follow up imaging. CTA head & neck with distal R M2 occlusion. Serial CT with worsening R MCA infarction.  Pt required intubation   She has undergone workup for stroke including TEE and CTA Head and Neck and LE dopplers (no DVT).    The patient has been monitored on telemetry which has demonstrated sinus rhythm with no arrhythmias.    Pt had a TEE earlier this admisison in setting of ? MV echodensity by TTE.  TEE showed normal MV, no vegetation or thrombus, EF 70-75%.    Lab work is reviewed.  She has had recurrent UTI w/hematuria. She is afebrile, being treated w/antibiotics, WBC wnl. Hematuria is resolving/improving Dr. Curt Bears is aware, given not an intravascular device, OK to proceed  Prior to admission, the patient denies chest pain, shortness of breath, dizziness, palpitations, or syncope.  They are recovering from their stroke with plans to attend CIR  at discharge (today).  Past Medical History:  Diagnosis Date   Arthritis    OA AND PAIN BOTH KNEES -RIGHT KNEE HURTS WORSE THAN LEFT   Cataract    Diabetes mellitus    ORAL MEDICATION - NO INSULIN   Hypertension    Hypertensive  retinopathy    OU   Hypothyroidism    Kidney stones    NONE AT PRESENT TIME THAT PT IS AWARE OF   Osteoporosis      Surgical History:  Past Surgical History:  Procedure Laterality Date   ABDOMINAL HYSTERECTOMY     BUBBLE STUDY  03/26/2020   Procedure: BUBBLE STUDY;  Surgeon: Dorothy Spark, MD;  Location: [facility];  Service: Cardiovascular;;   CATARACT EXTRACTION     CHOLECYSTECTOMY  1972   COLONOSCOPY     EYE SURGERY     RIGHT CATARACT EXTRACTON WITH LENS IMPLANT   GROWTH REMOVED FROM THYROID     INTRAMEDULLARY (IM) NAIL INTERTROCHANTERIC Left 02/10/2018   Procedure: INTRAMEDULLARY (IM) NAIL INTERTROCHANTRIC;  Surgeon: Mcarthur Rossetti, MD;  Location: [facility];  Service: Orthopedics;  Laterality: Left;   INTRAMEDULLARY (IM) NAIL INTERTROCHANTERIC Right 08/20/2018   Procedure: ORIF RIGHT HIP INTERTROC FRACTURE;  Surgeon: Mcarthur Rossetti, MD;  Location: [facility];  Service: Orthopedics;  Laterality: Right;   IR CT HEAD LTD  03/24/2020   IR PERCUTANEOUS ART THROMBECTOMY/INFUSION INTRACRANIAL INC DIAG ANGIO  03/24/2020   left hip surgery   02/2018   ORIF HIP FRACTURE Right 08/20/2018   PERCUTANEOUS NEPHROLITHOTOMY     RADIOLOGY WITH ANESTHESIA N/A 03/23/2020   Procedure: IR WITH ANESTHESIA;  Surgeon: Luanne Bras, MD;  Location: [facility];  Service: Radiology;  Laterality: N/A;   TEE WITHOUT CARDIOVERSION N/A 03/26/2020   Procedure: TRANSESOPHAGEAL ECHOCARDIOGRAM (TEE);  Surgeon: Dorothy Spark, MD;  Location: [facility];  Service: Cardiovascular;  Laterality: N/A;  TOTAL KNEE ARTHROPLASTY  06/09/2012   Procedure: TOTAL KNEE ARTHROPLASTY;  Surgeon: Mcarthur Rossetti, MD;  Location: [facility];  Service: Orthopedics;  Laterality: Right;  Right Total Knee Arthroplasty   TOTAL KNEE ARTHROPLASTY Left 04/09/2014   Procedure: LEFT TOTAL KNEE ARTHROPLASTY;  Surgeon: Mcarthur Rossetti, MD;  Location: [facility];  Service: Orthopedics;  Laterality: Left;   TUBAL LIGATION         Facility-Administered Medications Prior to Admission  Medication Dose Route Frequency Provider Last Rate Last Admin   Bevacizumab (AVASTIN) SOLN 1.25 mg  1.25 mg Intravitreal  Bernarda Caffey, MD   1.25 mg at 12/08/18 1150   denosumab (PROLIA) injection 60 mg  60 mg Subcutaneous Q6 months Rakes, Linda M, FNP   60 mg at 05/17/19 1313   Medications Prior to Admission  Medication Sig Dispense Refill Last Dose   amLODipine (NORVASC) 5 MG tablet TAKE ONE (1) TABLET EACH DAY (Patient taking differently: Take 5 mg by mouth daily. ) 90 tablet 0 03/23/2020 at Unknown time   aspirin EC 81 MG tablet Take 81 mg by mouth daily.   03/23/2020 at Unknown time   cholecalciferol (VITAMIN D) 1000 units tablet Take 5,000 Units by mouth daily.    03/23/2020 at Unknown time   denosumab (PROLIA) 60 MG/ML SOSY injection Inject 60 mg into the skin every 6 (six) months. 1 mL 0 unk   Dulaglutide (TRULICITY) 8.25 PG/9.8MK SOPN Inject 0.75 mg into the skin once a week.    Past Week at Unknown time   glimepiride (AMARYL) 1 MG tablet Take 1 mg by mouth daily with breakfast.    03/23/2020 at Unknown time   ibuprofen (ADVIL) 200 MG tablet Take 400 mg by mouth 2 (two) times daily as needed for moderate pain.    Past Month at Unknown time   levothyroxine (SYNTHROID) 125 MCG tablet Take 1 tablet (125 mcg total) by mouth daily. 90 tablet 3 03/23/2020 at Unknown time   lisinopril (ZESTRIL) 20 MG tablet Take 1 tablet (20 mg total) by mouth daily. 90 tablet 1 03/23/2020 at Unknown time   metFORMIN (GLUCOPHAGE) 500 MG tablet Take 1 tablet (500 mg total) by mouth 2 (two) times daily with a meal. (Patient taking differently: Take 500 mg by mouth in the morning and at bedtime. ) 180 tablet 2 03/23/2020 at Unknown time   metoprolol tartrate (LOPRESSOR) 100 MG tablet TAKE ONE TABLET BY MOUTH TWICE DAILY (Patient taking differently: Take 100 mg by mouth 2 (two) times daily. ) 180 tablet 0 03/23/2020 at 0830   naproxen sodium (ALEVE) 220 MG tablet  Take 220 mg by mouth 2 (two) times daily as needed (for pain).   Past Month at Unknown time   Omega-3 Fatty Acids (FISH OIL PO) Take 1 capsule by mouth daily.   03/23/2020 at Unknown time   pravastatin (PRAVACHOL) 40 MG tablet Take 1 tablet (40 mg total) by mouth at bedtime. 90 tablet 2 Past Week at Unknown time   Blood Glucose Monitoring Suppl (ONETOUCH VERIO) w/Device KIT 1 each by Does not apply route daily. 1 kit 0    glucose blood (ONETOUCH VERIO) test strip Use as instructed 100 each 12    Lancets (ONETOUCH ULTRASOFT) lancets Use as instructed 100 each 12     Inpatient Medications:   amantadine  150 mg Per Tube 2 times per day   amLODipine  10 mg Per Tube Daily   aspirin  325 mg Per Tube Daily  Chlorhexidine Gluconate Cloth  6 each Topical Daily   clopidogrel  75 mg Per Tube Daily   docusate  100 mg Per Tube BID   ezetimibe  10 mg Per Tube Daily   feeding supplement (PRO-STAT SUGAR FREE 64)  30 mL Per Tube BID   heparin injection (subcutaneous)  5,000 Units Subcutaneous Q8H   insulin aspart  0-20 Units Subcutaneous Q4H   insulin aspart  4 Units Subcutaneous Q4H   insulin detemir  10 Units Subcutaneous BID   lisinopril  20 mg Per Tube Daily   mouth rinse  15 mL Mouth Rinse BID   pantoprazole sodium  40 mg Per Tube QHS   polyethylene glycol  17 g Per Tube Daily   pravastatin  40 mg Per Tube q1800   rOPINIRole  0.25 mg Oral BID    Allergies:  Allergies  Allergen Reactions   Semaglutide Nausea And Vomiting    PO Rybelsus   Farxiga [Dapagliflozin]     Yeast infections   Penicillins Itching and Rash    Has tolerated Cefazlin and Ceftriaxone in past     Social History   Socioeconomic History   Marital status: Married    Spouse name: Berneta Sages    Number of children: 2   Years of education: 15   Highest education level: Some college, no degree  Occupational History   Occupation: retired     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    Comment: bank - 57yr  Tobacco  Use   Smoking status: Never Smoker   Smokeless tobacco: Never Used  VScientific laboratory technicianUse: Never used  Substance and Sexual Activity   Alcohol use: No   Drug use: No   Sexual activity: Yes    Birth control/protection: Surgical  Other Topics Concern   Not on file  Social History Narrative   Not on file   Social Determinants of Health   Financial Resource Strain: Low Risk    Difficulty of Paying Living Expenses: Not hard at all  Food Insecurity: No Food Insecurity   Worried About RCharity fundraiserin the Last Year: Never true   RArboriculturistin the Last Year: Never true  Transportation Needs: No Transportation Needs   Lack of Transportation (Medical): No   Lack of Transportation (Non-Medical): No  Physical Activity: Sufficiently Active   Days of Exercise per Week: 7 days   Minutes of Exercise per Session: 30 min  Stress: No Stress Concern Present   Feeling of Stress : Not at all  Social Connections: Socially Integrated   Frequency of Communication with Friends and Family: More than three times a week   Frequency of Social Gatherings with Friends and Family: More than three times a week   Attends Religious Services: More than 4 times per year   Active Member of [employer] Organizations: Yes   Attends CMusic therapist More than 4 times per year   Marital Status: Married  IHuman resources officerViolence: Not At Risk   Fear of Current or Ex-Partner: No   Emotionally Abused: No   Physically Abused: No   Sexually Abused: No     Family History  Problem Relation Age of Onset   Heart disease Mother    Breast cancer Mother        621's  Stroke Father    Heart disease Father    Healthy Daughter    Healthy Son  Liver disease Maternal Grandmother        gall stones imbedded in liver - blead to death when in surgery    Pneumonia Maternal Grandfather    Diabetes Paternal Grandmother       Review of Systems: All other systems reviewed and are otherwise  negative except as noted above.  Physical Exam: Vitals:   03/31/20 1626 03/31/20 2019 03/31/20 2349 04/01/20 0734  BP: (!) 103/58 (!) 151/78 133/77 124/66  Pulse: 89 88 84 87  Resp:  20 19 18   Temp:  (!) 97.3 F (36.3 C) 98.1 F (36.7 C) 98 F (36.7 C)  TempSrc:  Oral Oral Oral  SpO2: 93% 95% 95% 94%  Weight:        GEN- The patient is well appearing, alert and oriented x 3 today.   Head- normocephalic, atraumatic Eyes-  Sclera clear, conjunctiva pink Ears- hearing intact Oropharynx- clear Neck- supple Lungs- CTA b/l, normal work of breathing Heart- RRR, no murmurs, rubs or gallops  GI- soft, NT, ND Extremities- no clubbing, cyanosis, or edema MS- no significant deformity or atrophy Skin- no rash or lesion Psych- euthymic mood, full affect, she is pleasant and appropriate   Labs:   Lab Results  Component Value Date   WBC 15.6 (H) 04/01/2020   HGB 12.4 04/01/2020   HCT 38.2 04/01/2020   MCV 89.9 04/01/2020   PLT 450 (H) 04/01/2020    Recent Labs  Lab 03/27/20 0707 03/28/20 0408 04/01/20 0554  NA 140   < > 143  K 3.4*   < > 4.6  CL 110   < > 107  CO2 21*   < > 24  BUN 24*   < > 47*  CREATININE 1.23*   < > 1.51*  CALCIUM 7.9*   < > 9.4  PROT 5.7*  --   --   BILITOT 0.7  --   --   ALKPHOS 58  --   --   ALT 14  --   --   AST 27  --   --   GLUCOSE 238*   < > 207*   < > = values in this interval not displayed.     Radiology/Studies: CT ANGIO HEAD W OR WO CONTRAST  Result Date: 03/23/2020 CLINICAL DATA:  Left arm weakness and facial droop EXAM: CT ANGIOGRAPHY HEAD AND NECK TECHNIQUE: Multidetector CT imaging of the head and neck was performed using the standard protocol during bolus administration of intravenous contrast. Multiplanar CT image reconstructions and MIPs were obtained to evaluate the vascular anatomy. Carotid stenosis measurements (when applicable) are obtained utilizing NASCET criteria, using the distal internal carotid diameter as the  denominator. CONTRAST:  91m OMNIPAQUE IOHEXOL 350 MG/ML SOLN COMPARISON:  None. FINDINGS: CTA NECK FINDINGS SKELETON: There is no bony spinal canal stenosis. No lytic or blastic lesion. OTHER NECK: Normal pharynx, larynx and major salivary glands. No cervical lymphadenopathy. Unremarkable thyroid gland. UPPER CHEST: No pneumothorax or pleural effusion. No nodules or masses. AORTIC ARCH: There is no calcific atherosclerosis of the aortic arch. There is no aneurysm, dissection or hemodynamically significant stenosis of the visualized portion of the aorta. Conventional 3 vessel aortic branching pattern. The visualized proximal subclavian arteries are widely patent. RIGHT CAROTID SYSTEM: Normal without aneurysm, dissection or stenosis. LEFT CAROTID SYSTEM: Normal without aneurysm, dissection or stenosis. VERTEBRAL ARTERIES: Left dominant configuration. Both origins are clearly patent. There is no dissection, occlusion or flow-limiting stenosis to the skull base (V1-V3 segments). CTA HEAD FINDINGS  POSTERIOR CIRCULATION: --Vertebral arteries: Normal V4 segments. --Inferior cerebellar arteries: Normal. --Basilar artery: Normal. --Superior cerebellar arteries: Normal. --Posterior cerebral arteries (PCA): Normal. ANTERIOR CIRCULATION: --Intracranial internal carotid arteries: Normal. --Anterior cerebral arteries (ACA): Normal. Both A1 segments are present. Patent anterior communicating artery (a-comm). --Middle cerebral arteries (MCA): There is a distal right MCA M1 segment occlusion. Left MCA is normal. VENOUS SINUSES: As permitted by contrast timing, patent. ANATOMIC VARIANTS: Fetal origin of the right posterior cerebral artery. There is moderate collateral flow in the right MCA territory. Review of the MIP images confirms the above findings. IMPRESSION: 1. Distal right MCA M1 segment occlusion with moderate collateral flow in the right MCA territory. 2. No other intracranial arterial occlusion or high-grade stenosis. 3.  Normal cervical carotid and vertebral arteries. Critical Value/emergent results were called by telephone at the time of interpretation on 03/23/2020 at 11:13 pm to provider Samara Snide , who verbally acknowledged these results. Electronically Signed   By: Ulyses Jarred M.D.   On: 03/23/2020 23:17   DG Abd 1 View  Result Date: 03/24/2020 CLINICAL DATA:  Vomiting. EXAM: ABDOMEN - 1 VIEW COMPARISON:  Abdominal x-ray dated September 25, 2015. FINDINGS: The bowel gas pattern is normal. No radio-opaque calculi or other significant radiographic abnormality are seen. Excreted contrast in the renal collecting systems, ureters, and bladder. Moderate right and mild left hydroureteronephrosis. No acute osseous abnormality. IMPRESSION: 1. No obstruction. 2. Moderate right and mild left hydroureteronephrosis. Electronically Signed   By: Titus Dubin M.D.   On: 03/24/2020 16:42   CT HEAD WO CONTRAST  Result Date: 03/24/2020 CLINICAL DATA:  Stroke follow-up. Encephalopathy. Right MCA thrombectomy 03/24/2020 EXAM: CT HEAD WITHOUT CONTRAST TECHNIQUE: Contiguous axial images were obtained from the base of the skull through the vertex without intravenous contrast. COMPARISON:  CT head 03/24/2020 at 1201 hours. CT head 03/24/2020 at 0224 hours. FINDINGS: Brain: Progressive edema right MCA territory consistent with acute infarction. This involves the insula, superior temporal lobe, and frontal parietal cortex. No associated hemorrhage or midline shift. Low-density in the anterior frontal lobes bilaterally is symmetric and is felt to be due to artifact from motion and beam hardening. Low-density in the left frontal white matter compatible with chronic ischemia. Ventricle size normal. Vascular: Hyperdensity in the left M2 branch in the sylvian fissure compatible with thrombosis. Negative for hyperdense M1 segment. Skull: No focal skeletal lesion. Sinuses/Orbits: Paranasal sinuses clear. Bilateral cataract extraction. Other:  None IMPRESSION: Progressive low-density in the right MCA territory compatible with acute infarct. Sparing of the basal ganglia. No associated hemorrhage. Hyperdense right M2 segment sylvian fissure compatible with thrombosis. Electronically Signed   By: Franchot Gallo M.D.   On: 03/24/2020 21:59   CT HEAD WO CONTRAST  Result Date: 03/24/2020 CLINICAL DATA:  Stroke. Left arm weakness and facial droop. Right M1 occlusion yesterday with subsequent vascular intervention. EXAM: CT HEAD WITHOUT CONTRAST TECHNIQUE: Contiguous axial images were obtained from the base of the skull through the vertex without intravenous contrast. COMPARISON:  Head CT earlier same day FINDINGS: Brain: Developing low-density with loss of gray-white differentiation throughout the right middle cerebral artery territory affecting the insula, frontal operculum and right frontoparietal cortical and subcortical brain. Sparing of the basal ganglia. No evidence of hemorrhage or midline shift. Chronic small-vessel ischemic changes elsewhere affect the cerebral hemispheric white matter. Old right cerebellar infarction. No hydrocephalus. No extra-axial collection. Vascular: There is atherosclerotic calcification of the major vessels at the base of the brain. Skull: Negative Sinuses/Orbits: Clear/normal Other: None  IMPRESSION: Changes of acute infarction now evident by CT affecting the right middle cerebral artery territory with sparing of the basal ganglia. No hemorrhage. No midline shift. Electronically Signed   By: Nelson Chimes M.D.   On: 03/24/2020 12:32   CT HEAD WO CONTRAST  Result Date: 03/24/2020 CLINICAL DATA:  Status post thrombectomy EXAM: CT HEAD WITHOUT CONTRAST TECHNIQUE: Contiguous axial images were obtained from the base of the skull through the vertex without intravenous contrast. COMPARISON:  None. FINDINGS: Brain: There is no mass, hemorrhage or extra-axial collection. The size and configuration of the ventricles and extra-axial  CSF spaces are normal. There is hypoattenuation of the white matter, most commonly indicating chronic small vessel disease. Vascular: Expected intravascular enhancement. Skull: The visualized skull base, calvarium and extracranial soft tissues are normal. Sinuses/Orbits: No fluid levels or advanced mucosal thickening of the visualized paranasal sinuses. No mastoid or middle ear effusion. The orbits are normal. IMPRESSION: 1. No acute intracranial abnormality. 2. Chronic small vessel disease. Electronically Signed   By: Ulyses Jarred M.D.   On: 03/24/2020 02:34   CT ANGIO NECK W OR WO CONTRAST  Result Date: 03/23/2020 CLINICAL DATA:  Left arm weakness and facial droop EXAM: CT ANGIOGRAPHY HEAD AND NECK TECHNIQUE: Multidetector CT imaging of the head and neck was performed using the standard protocol during bolus administration of intravenous contrast. Multiplanar CT image reconstructions and MIPs were obtained to evaluate the vascular anatomy. Carotid stenosis measurements (when applicable) are obtained utilizing NASCET criteria, using the distal internal carotid diameter as the denominator. CONTRAST:  65m OMNIPAQUE IOHEXOL 350 MG/ML SOLN COMPARISON:  None. FINDINGS: CTA NECK FINDINGS SKELETON: There is no bony spinal canal stenosis. No lytic or blastic lesion. OTHER NECK: Normal pharynx, larynx and major salivary glands. No cervical lymphadenopathy. Unremarkable thyroid gland. UPPER CHEST: No pneumothorax or pleural effusion. No nodules or masses. AORTIC ARCH: There is no calcific atherosclerosis of the aortic arch. There is no aneurysm, dissection or hemodynamically significant stenosis of the visualized portion of the aorta. Conventional 3 vessel aortic branching pattern. The visualized proximal subclavian arteries are widely patent. RIGHT CAROTID SYSTEM: Normal without aneurysm, dissection or stenosis. LEFT CAROTID SYSTEM: Normal without aneurysm, dissection or stenosis. VERTEBRAL ARTERIES: Left dominant  configuration. Both origins are clearly patent. There is no dissection, occlusion or flow-limiting stenosis to the skull base (V1-V3 segments). CTA HEAD FINDINGS POSTERIOR CIRCULATION: --Vertebral arteries: Normal V4 segments. --Inferior cerebellar arteries: Normal. --Basilar artery: Normal. --Superior cerebellar arteries: Normal. --Posterior cerebral arteries (PCA): Normal. ANTERIOR CIRCULATION: --Intracranial internal carotid arteries: Normal. --Anterior cerebral arteries (ACA): Normal. Both A1 segments are present. Patent anterior communicating artery (a-comm). --Middle cerebral arteries (MCA): There is a distal right MCA M1 segment occlusion. Left MCA is normal. VENOUS SINUSES: As permitted by contrast timing, patent. ANATOMIC VARIANTS: Fetal origin of the right posterior cerebral artery. There is moderate collateral flow in the right MCA territory. Review of the MIP images confirms the above findings. IMPRESSION: 1. Distal right MCA M1 segment occlusion with moderate collateral flow in the right MCA territory. 2. No other intracranial arterial occlusion or high-grade stenosis. 3. Normal cervical carotid and vertebral arteries. Critical Value/emergent results were called by telephone at the time of interpretation on 03/23/2020 at 11:13 pm to provider SSamara Snide, who verbally acknowledged these results. Electronically Signed   By: KUlyses JarredM.D.   On: 03/23/2020 23:17   MR ANGIO HEAD WO CONTRAST  Result Date: 03/25/2020 CLINICAL DATA:  Follow-up examination for acute stroke. EXAM:  MRA HEAD WITHOUT CONTRAST TECHNIQUE: Angiographic images of the Circle of Willis were obtained using MRA technique without intravenous contrast. COMPARISON:  Prior brain MRI from earlier the same day as well as recent CTA from 03/23/2020. FINDINGS: ANTERIOR CIRCULATION: Examination degraded by motion artifact, limiting assessment. Distal cervical segments of the internal carotid arteries are widely patent with symmetric  antegrade flow. Petrous, cavernous, and supraclinoid ICAs patent without appreciable stenosis or other abnormality. A1 segments patent bilaterally. Normal anterior communicating artery complex. Anterior cerebral arteries remain widely patent to their distal aspects without stenosis. Right M1 segment patent proximally, but poorly visualized at its mid and distal aspect, and suspected to be reoccluded since prior catheter directed intervention. Right MCA bifurcation not well seen. Few small attenuated branches remain patent within the distal right MCA distribution, relatively stable from initial CTA. Left M1 segment widely patent. Normal left MCA bifurcation. Distal left MCA branches well perfused. POSTERIOR CIRCULATION: Vertebral arteries patent to the vertebrobasilar junction without stenosis. Patent right PICA. Left PICA not seen. Basilar patent to its distal aspect without stenosis. Superior cerebral arteries grossly patent proximally. Left PCA supplied via the basilar. Predominant fetal type origin of the right PCA. PCAs remain patent to their distal aspects without stenosis. No appreciable intracranial aneurysm. IMPRESSION: 1. Technically limited exam due to motion artifact. 2. Probable reocclusion of the distal right M1 segment since recent catheter directed revascularization. Few attenuated small vessels remain patent within the right MCA distribution distally. Overall, appearance is similar as compared to initial presenting CTA. 3. No other intracranial large vessel occlusion or high-grade stenosis. Electronically Signed   By: Jeannine Boga M.D.   On: 03/25/2020 21:08   MR BRAIN WO CONTRAST  Result Date: 03/25/2020 CLINICAL DATA:  Follow-up examination for acute stroke. EXAM: MRI HEAD WITHOUT CONTRAST TECHNIQUE: Multiplanar, multiecho pulse sequences of the brain and surrounding structures were obtained without intravenous contrast. COMPARISON:  Prior head CT from 03/24/2013 as well as previous  studies. FINDINGS: Brain: Examination moderately degraded by motion artifact. Generalized age-related cerebral atrophy. Patchy T2/FLAIR hyperintensity within the periventricular and deep white matter both cerebral hemispheres as well as the pons, most consistent with chronic microvascular ischemic disease. Few scatter remote lacunar infarcts noted about the bilateral basal ganglia and thalami. Small remote right cerebellar infarct with associated chronic hemosiderin staining noted. Large confluent area of restricted diffusion seen involving the majority of the right MCA distribution, consistent with large acute right MCA territory infarct. Associated gyral swelling and edema throughout the area of infarction. Regional mass effect without significant right-to-left midline shift. Basilar cisterns remain widely patent. Associated petechial hemorrhage throughout the area of infarction without frank hemorrhagic transformation. Additional scattered patchy small volume foci of acute ischemia seen involving the right PCA territory, with involvement of the splenium, right thalamus, and right temporal occipital region. This is likely based on the predominant fetal type origin right PCA seen on corresponding CTA and MRA. Additional punctate focus of acute ischemia seen at the posterior left frontal periventricular white matter (series 2, image 25). No mass lesion or hydrocephalus. No extra-axial fluid collection. Pituitary gland suprasellar region within normal limits. Midline structures intact. Vascular: Loss of normal flow void within the distal right M1 segment extending into right M2 branches at the sylvian fissure. Associated susceptibility artifact seen on SWI sequence (series 7, image 33), consistent with thrombus. Finding consistent with reocclusion of previously revascularized right MCA, also seen on corresponding MRA. Major intracranial vascular flow voids otherwise maintained. Skull and upper cervical  spine:  Craniocervical junction within normal limits. Bone marrow signal intensity normal. No scalp soft tissue abnormality. Sinuses/Orbits: Patient status post bilateral ocular lens replacement. Globes and orbital soft tissues demonstrate no acute finding. Paranasal sinuses are largely clear. No significant mastoid effusion. Other: None. IMPRESSION: 1. 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 Associated petechial hemorrhage without frank hemorrhagic transformation. 2. Additional scattered small volume acute ischemic nonhemorrhagic right PCA territory infarcts, likely based on the fetal type origin right PCA as seen on corresponding MRA and CTA. 3. Additional punctate focus of acute ischemia involving the posterior left frontal periventricular white matter. 4. Loss of normal flow void with susceptibility artifact involving distal right M1/M2 branch, consistent with reocclusion. Finding consistent with findings seen on corresponding MRA. 5. Underlying age-related cerebral atrophy with chronic microvascular ischemic disease, with a few scattered remote lacunar infarcts involving the bilateral basal ganglia and thalami. Electronically Signed   By: Jeannine Boga M.D.   On: 03/25/2020 21:28   US RENAL  Result Date: 03/24/2020 CLINICAL DATA:  Acute kidney injury.  Elevated BUN and creatinine. EXAM: RENAL / URINARY TRACT ULTRASOUND COMPLETE COMPARISON:  None. FINDINGS: Right Kidney: Renal measurements: 9.7 x 4.7 x 4.2 cm. = volume: 97 mL. Normal echogenicity. Upper pole exophytic cyst measures 3.5 x 3.2 x 3.6 cm. No hydronephrosis Left Kidney: Renal measurements: 9.7 x 5.5 x 4.8 cm = volume: 132.8 mL. Normal echogenicity. No mass or hydronephrosis. Inferior pole cyst measures 1.8 x 1.7 x 1.6 cm. Bladder: Collapsed urinary bladder is not visualized. Other: None. IMPRESSION: 1. No acute abnormality. 2. Asymmetric right atrophy. 3. Bilateral kidney cysts. Electronically Signed   By: Kerby Moors M.D.   On: 03/24/2020 22:54   IR CT Head Ltd  Result Date: 03/27/2020 INDICATION: New onset confusion and left-sided weakness. 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 EXAM: 1. EMERGENT LARGE VESSEL OCCLUSION THROMBOLYSIS anterior CIRCULATION) COMPARISON:  CT angiogram of the head and neck of March 23, 2020. MEDICATIONS: None ANESTHESIA/SEDATION: General anesthesia CONTRAST:  Isovue 300 approximately 75 mL. FLUOROSCOPY TIME:  Fluoroscopy Time: 56 minutes 42 seconds (1865 mGy). COMPLICATIONS: None immediate. TECHNIQUE: Following a full explanation of the procedure along with the potential associated complications, an informed witnessed consent was obtained spouse and son the risks of intracranial hemorrhage of 10%, worsening neurological deficit, ventilator dependency, death and inability to revascularize were all reviewed in detail with the patient's spouse and son. The patient was then put under general anesthesia by the Department of Anesthesiology at [facility]. The right groin was prepped and draped in the usual sterile fashion. Thereafter using modified Seldinger technique, transfemoral access into the right common femoral artery was obtained without difficulty. Over a 0.035 inch guidewire an 8 French 25 cm Pinnacle sheath was inserted. Through this, and also over a 0.035 inch guidewire a combination of support a 5.5 French catheter inside of an 088 balloon guide catheter which had been prepped with 50% contrast and 50% heparinized saline infusion was advanced to the aortic arch. The guidewire was then advanced distally into the right common carotid artery followed by the formed Simmons support catheter followed by the balloon guide catheter. The combination was navigated without difficulty to distal cervical right ICA. The guidewire and the 5.5 [place] support Simmons catheter were removed. Good aspiration obtained from the hub of  the balloon guide catheter in the right internal carotid artery. FINDINGS: A control arteriogram  performed through this demonstrated patency of the right internal carotid artery distal cervical, petrous cavernous and supraclinoid segments. A right posterior communicating artery is seen opacifying the right posterior cerebral artery distribution. The right middle cerebral artery demonstrates complete occlusion of the distal M1 segment. Opacification of the anterior temporal branch was noted. The right anterior cerebral artery opacifies into the capillary and venous phases. Initial right common carotid arteriogram demonstrated the right external carotid artery and its major branches to be widely patent. The right internal carotid artery at the bulb to the cranial skull base also demonstrates wide patency to the petrous portion. More distally the petrous, the cavernous and the supraclinoid segments demonstrate wide patency. The right posterior communicating artery opacifying the right posterior cerebral artery distribution was noted to be present. PROCEDURE: Through the 088 French balloon guide catheter in the distal right internal carotid artery, a combination of a Zoom 071 guide catheter inside of which was a 021 Trevo ProVue microcatheter was advanced over a 0.014 inch standard Synchro micro guidewire with a J configuration, the combination was navigated without difficulty to the proximal cavernous segment. The micro guidewire was advanced without difficulty to the M2 M3 region of the inferior division of the right middle cerebellar followed by the microcatheter. The guidewire was removed. Good aspiration obtained from the hub of the microcatheter. A gentle control arteriogram performed through the microcatheter demonstrated safe position of the tip of the microcatheter. At this time, a 5 mm x 37 mm Embotrap device was advanced to the distal end of the microcatheter. The hub on the microcatheter was loosened. With  slight forward gentle traction with the right hand on the delivery micro guidewire the microcatheter was retrieved unsheathing the retrieval device. Proximal flow arrest was then initiated in the right internal carotid artery. Multiple attempts were made to advance the 071 Zoom catheter into the right middle cerebral artery. After considerable difficulty this was achieved. The 071 microcatheter was then advanced and positioned at the origin of the occlusion of the right middle cerebral artery distally. With constant aspiration being applied at the hub of the Zoom guide catheter, and hub of the balloon guide catheter with a 60 mL syringe over approximately 2-1/2 minutes, the combination of the retrieval device, the microcatheter and the Zoom catheter were retrieved and removed. Specks of clot were seen intertwined in the retrieval device. Following reversal of flow arrest, a control arteriogram performed through the balloon guide catheter continued to demonstrate no appreciable change in the occluded right middle cerebral M1 segment distally. A second pass was then made at this time using a 6 [place] Catalyst intermediary guide catheter inside of which was a Marksman 160 cm 027 microcatheter over an 016 inch Fathom micro guidewire. Again the microcatheter was positioned in the distal M2 M3 segment of the inferior division. The guidewire was removed. Good aspiration obtained from the hub of the microcatheter which was then connected to continuous heparinized saline infusion. A 4 mm x 40 mm Solitaire X retrieval device was then advanced to the distal end of the microcatheter and deployed in the manner described above. The Catalyst guide catheter was advanced and positioned just inside the origin of the dominant inferior division with achievement of a complete flow arrest. With Penumbra aspiration being applied at the hub of the Catalyst guide catheter, and proximal flow arrest of the balloon catheter in the right  internal carotid artery at 2-1/2 minutes, the combination of the retrieval device, the microcatheter and the 6  [place] Catalyst catheter were retrieved and removed. Flow arrest was then reversed. A control arteriogram performed through the balloon guide catheter in the right internal carotid artery demonstrated complete revascularization of the right middle cerebral artery distribution achieving a TICI 3 revascularization. Patency of the right anterior cerebral artery and the right posterior communicating artery was verified to be present. Spasm in the right internal carotid artery and the right internal carotid artery responded to 25 mcg of nitroglycerin intra-arterially. The balloon guide catheter was then retrieved into the right common carotid artery. A control arteriogram performed at this site continued to demonstrate wide patency of the right external carotid artery and its branches, also the right internal carotid artery at the bulb to the cranial skull base demonstrates wide patency. Intracranially no change was seen in the TICI 3 revascularization of the right middle cerebral artery distribution. The balloon guide catheter was retrieved and removed. A CT scan of the brain performed demonstrated no evidence of hemorrhage, mass or midline shift. The 8 French Pinnacle sheath was then removed with the successful application of an 8 French Angio-Seal closure device in the right groin. The groin appeared soft without evidence of hematoma or bleeding. Distal pulses remained Dopplerable in the posterior tibial, and the dorsalis pedis regions bilaterally unchanged. The patient was the left intubated because of the patient's neurological condition, and in order to protect the patient's airway. Patient was then transferred to the neuro ICU intubated for post thrombectomy management. IMPRESSION: Status post endovascular complete revascularization of the occluded right middle cerebral M1 segment with 1 pass with the 5  mm x 37 mm Embotrap retrieval device, and 1 pass with the Solitaire 4 mm x 40 mm X retrieval device and Penumbra aspiration achieving a TICI 3 revascularization. PLAN: Follow-up in the clinic 4 weeks post discharge. Electronically Signed   By: Luanne Bras M.D.   On: 03/25/2020 10:53   DG CHEST PORT 1 VIEW  Result Date: 03/26/2020 CLINICAL DATA:  Code stroke EXAM: PORTABLE CHEST 1 VIEW COMPARISON:  03/25/2020 FINDINGS: Cardiomediastinal contours and hilar structures are stable with central pulmonary vascular engorgement, heart size accentuated by low volumes and portable technique. No consolidation. No pleural effusion. Suspect subsegmental RIGHT lower lobe atelectasis as before. Visualized skeletal structures are unremarkable. IMPRESSION: No acute cardiopulmonary disease. Mild RIGHT basilar atelectasis similar to prior studies. Electronically Signed   By: Zetta Bills M.D.   On: 03/26/2020 08:32   DG CHEST PORT 1 VIEW  Result Date: 03/25/2020 CLINICAL DATA:  Fever and vomiting.  Concern for aspiration. EXAM: PORTABLE CHEST 1 VIEW COMPARISON:  Chest x-ray from yesterday. FINDINGS: The heart size and mediastinal contours are within normal limits. Normal pulmonary vascularity. Persistent low lung volumes and chronic elevation of the right hemidiaphragm with unchanged mild right basilar atelectasis. No focal consolidation, pleural effusion, or pneumothorax. No acute osseous abnormality. IMPRESSION: 1. Persistent low lung volumes and mild right basilar atelectasis. No active disease. Electronically Signed   By: Titus Dubin M.D.   On: 03/25/2020 10:05   DG CHEST PORT 1 VIEW  Result Date: 03/24/2020 CLINICAL DATA:  Stroke.  Aspiration. EXAM: PORTABLE CHEST 1 VIEW COMPARISON:  Earlier same day FINDINGS: Endotracheal tube is been removed. Heart size remains normal. No pulmonary edema. The patient has not taken a deep inspiration. There may be mild atelectasis at the right lung base but there is no  dense consolidation or lobar collapse. No effusion. Surgical clips again noted at the right thoracic inlet. IMPRESSION: Extubated.  Mild atelectasis at the right lung base. No dense consolidation or lobar collapse. Electronically Signed   By: Nelson Chimes M.D.   On: 03/24/2020 13:07   DG CHEST PORT 1 VIEW  Result Date: 03/24/2020 CLINICAL DATA:  Endotracheal tube placement. EXAM: PORTABLE CHEST 1 VIEW COMPARISON:  August 19, 2018 FINDINGS: The endotracheal tube terminates above the carina by approximately 3 cm. The heart size is unchanged from prior study. The lung volumes are low. Aortic calcifications are noted. There is no pneumothorax. Bibasilar atelectasis is noted. IMPRESSION: 1. Endotracheal tube as above. 2. Low lung volumes with bibasilar atelectasis. Electronically Signed   By: Constance Holster M.D.   On: 03/24/2020 03:54   DG Swallowing Func-Speech Pathology  Result Date: 03/28/2020 Objective Swallowing Evaluation: Type of Study: MBS-Modified Barium Swallow Study  Patient Details [name] MRN: [id number] Date of Birth: 13-Apr-1946 Today's Date: 03/28/2020 Time: SLP Start Time (ACUTE ONLY): 74 -SLP Stop Time (ACUTE ONLY): 1308 SLP Time Calculation (min) (ACUTE ONLY): 12 min Past Medical History: Past Medical History: Diagnosis Date  Arthritis   OA AND PAIN BOTH KNEES -RIGHT KNEE HURTS WORSE THAN LEFT  Cataract   Diabetes mellitus   ORAL MEDICATION - NO INSULIN  Hypertension   Hypertensive retinopathy   OU  Hypothyroidism   Kidney stones   NONE AT PRESENT TIME THAT PT IS AWARE OF  Osteoporosis  Past Surgical History: Past Surgical History: Procedure Laterality Date  ABDOMINAL HYSTERECTOMY    BUBBLE STUDY  03/26/2020  Procedure: BUBBLE STUDY;  Surgeon: Dorothy Spark, MD;  Location: [facility];  Service: Cardiovascular;;  CATARACT EXTRACTION    CHOLECYSTECTOMY  1972  COLONOSCOPY    EYE SURGERY    RIGHT CATARACT EXTRACTON WITH LENS IMPLANT  GROWTH REMOVED FROM THYROID     INTRAMEDULLARY (IM) NAIL INTERTROCHANTERIC Left 02/10/2018  Procedure: INTRAMEDULLARY (IM) NAIL INTERTROCHANTRIC;  Surgeon: Mcarthur Rossetti, MD;  Location: [facility];  Service: Orthopedics;  Laterality: Left;  INTRAMEDULLARY (IM) NAIL INTERTROCHANTERIC Right 08/20/2018  Procedure: ORIF RIGHT HIP INTERTROC FRACTURE;  Surgeon: Mcarthur Rossetti, MD;  Location: [facility];  Service: Orthopedics;  Laterality: Right;  IR CT HEAD LTD  03/24/2020  IR PERCUTANEOUS ART THROMBECTOMY/INFUSION INTRACRANIAL INC DIAG ANGIO  03/24/2020  left hip surgery   02/2018  ORIF HIP FRACTURE Right 08/20/2018  PERCUTANEOUS NEPHROLITHOTOMY    RADIOLOGY WITH ANESTHESIA N/A 03/23/2020  Procedure: IR WITH ANESTHESIA;  Surgeon: Luanne Bras, MD;  Location: [facility];  Service: Radiology;  Laterality: N/A;  TEE WITHOUT CARDIOVERSION N/A 03/26/2020  Procedure: TRANSESOPHAGEAL ECHOCARDIOGRAM (TEE);  Surgeon: Dorothy Spark, MD;  Location: [facility] ENDOSCOPY;  Service: Cardiovascular;  Laterality: N/A;  TOTAL KNEE ARTHROPLASTY  06/09/2012  Procedure: TOTAL KNEE ARTHROPLASTY;  Surgeon: Mcarthur Rossetti, MD;  Location: [facility];  Service: Orthopedics;  Laterality: Right;  Right Total Knee Arthroplasty  TOTAL KNEE ARTHROPLASTY Left 04/09/2014  Procedure: LEFT TOTAL KNEE ARTHROPLASTY;  Surgeon: Mcarthur Rossetti, MD;  Location: [facility];  Service: Orthopedics;  Laterality: Left;  TUBAL LIGATION   HPI: 74 y.o female admitted with sudden onset Lt sided weakness and surred speech.  Tpa was administered.  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 pt extubated 6/14, but had increasing lethsrgy > possibly septic due to UTI.  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   Subjective: Pt awake, alert, pleasant, participative Assessment / Plan / Recommendation CHL IP  CLINICAL IMPRESSIONS 03/28/2020 Clinical Impression Pt presents with inconsistent  aspiration across thin and thickened liquid consistencies which appears to be related to a combination of decreased attention to boluses in the setting of lethargy and overt cognitive impairment as well as decreased oral control of boluses resulting from oral motor weakness.  Aspiration was sensed and pt did intermittently exhibit the abiltiy to clear penetrates from the airway; however, due to pt's inconsistent presentation on today's study, diet initiation is not recommended at this time.  Instead, I would suggest beginning trials of purees and nectar thick liquids via teaspoon with speech therapy only.  Prognosis for advancement is good with improving alertness and mentation.   SLP Visit Diagnosis Dysphagia, oropharyngeal phase (R13.12) Attention and concentration deficit following -- Frontal lobe and executive function deficit following -- Impact on safety and function Moderate aspiration risk   CHL IP TREATMENT RECOMMENDATION 03/28/2020 Treatment Recommendations Therapy as outlined in treatment plan below   Prognosis 03/28/2020 Prognosis for Safe Diet Advancement Good Barriers to Reach Goals Cognitive deficits Barriers/Prognosis Comment -- CHL IP DIET RECOMMENDATION 03/28/2020 SLP Diet Recommendations NPO;Alternative means - temporary Liquid Administration via -- Medication Administration Via alternative means Compensations -- Postural Changes --   CHL IP OTHER RECOMMENDATIONS 03/28/2020 Recommended Consults -- Oral Care Recommendations Oral care QID Other Recommendations --   CHL IP FOLLOW UP RECOMMENDATIONS 03/28/2020 Follow up Recommendations Inpatient Rehab   CHL IP FREQUENCY AND DURATION 03/28/2020 Speech Therapy Frequency (ACUTE ONLY) min 3x week Treatment Duration --      CHL IP ORAL PHASE 03/28/2020 Oral Phase Impaired Oral - Pudding Teaspoon -- Oral - Pudding Cup -- Oral - Honey Teaspoon Left anterior bolus loss;Lingual/palatal residue;Weak lingual manipulation;Premature spillage;Decreased bolus cohesion Oral  - Honey Cup Left anterior bolus loss;Weak lingual manipulation;Decreased bolus cohesion;Premature spillage;Lingual/palatal residue Oral - Nectar Teaspoon Left anterior bolus loss;Weak lingual manipulation;Lingual/palatal residue;Decreased bolus cohesion;Premature spillage Oral - Nectar Cup Left anterior bolus loss;Decreased bolus cohesion;Premature spillage;Weak lingual manipulation;Lingual/palatal residue Oral - Nectar Straw -- Oral - Thin Teaspoon Left anterior bolus loss;Weak lingual manipulation;Decreased bolus cohesion;Premature spillage;Lingual/palatal residue Oral - Thin Cup -- Oral - Thin Straw -- Oral - Puree WFL Oral - Mech Soft -- Oral - Regular -- Oral - Multi-Consistency -- Oral - Pill -- Oral Phase - Comment --  CHL IP PHARYNGEAL PHASE 03/28/2020 Pharyngeal Phase Impaired Pharyngeal- Pudding Teaspoon -- Pharyngeal -- Pharyngeal- Pudding Cup -- Pharyngeal -- Pharyngeal- Honey Teaspoon Penetration/Aspiration during swallow Pharyngeal Material enters airway, CONTACTS cords and not ejected out Pharyngeal- Honey Cup Penetration/Aspiration during swallow Pharyngeal Material enters airway, passes BELOW cords and not ejected out despite cough attempt by patient Pharyngeal- Nectar Teaspoon Penetration/Aspiration during swallow Pharyngeal Material enters airway, CONTACTS cords and then ejected out Pharyngeal- Nectar Cup Penetration/Aspiration during swallow Pharyngeal Material enters airway, passes BELOW cords and not ejected out despite cough attempt by patient Pharyngeal- Nectar Straw -- Pharyngeal -- Pharyngeal- Thin Teaspoon Penetration/Aspiration during swallow Pharyngeal Material enters airway, passes BELOW cords and not ejected out despite cough attempt by patient Pharyngeal- Thin Cup -- Pharyngeal -- Pharyngeal- Thin Straw -- Pharyngeal -- Pharyngeal- Puree WFL Pharyngeal -- Pharyngeal- Mechanical Soft -- Pharyngeal -- Pharyngeal- Regular -- Pharyngeal -- Pharyngeal- Multi-consistency -- Pharyngeal --  Pharyngeal- Pill -- Pharyngeal -- Pharyngeal Comment --  No flowsheet data found. Page, Selinda Orion 03/28/2020, 2:23 PM              EEG adult  Result Date: 03/30/2020 Lora Havens, MD     03/30/2020  8:56 AM Patient [name] MRN: [id number] Epilepsy Attending: Lora Havens Referring Physician/Provider: Dr Phillips Odor Date: 03/29/2020 Duration:  22.29 mins Patient history: 73yo F with R MCA infarct, noted to have fluctuation mental status and movements of right leg concerning for seizure. EEG to evaluate for seizure Level of alertness: Awake AEDs during EEG study: None Technical aspects: This EEG study was done with scalp electrodes positioned according to the 10-20 International system of electrode placement. Electrical activity was acquired at a sampling rate of 500Hz  and reviewed with a high frequency filter of 70Hz  and a low frequency filter of 1Hz . EEG data were recorded continuously and digitally stored. Description: The posterior dominant rhythm consists of 9Hz  activity of moderate voltage (25-35 uV) seen predominantly in posterior head regions, symmetric and reactive to eye opening and eye closing. EEG showed continuous 3 to 6 Hz theta-delta slowing in right fronto-centro-temporal region. Intermittent generalized 3-5hz  theta-delta slowing was also noted Hyperventilation and photic stimulation were not performed.   During the study, patient was noted to have repeated right leg kicking movements. Concomitant eeg before, during and after the event didn't show any eeg change to suggest seizure. ABNORMALITY -Continuous slow,  Right fronto-centro-temporal region - Intermittent slow, generalized IMPRESSION: This study is suggestive of cortical dysfunction in right fronto-centro-temporal region consistent with underlying infarct. Additionally there is evidence of mild diffuse encephalopathy, non specific etiology. No seizures or epileptiform discharges were seen throughout the recording.  Multiple episodes of right leg kicking movements were captured without concomitant eeg change and were NOT epileptic Priyanka Barbra Sarks   EEG adult  Result Date: 03/25/2020 Lora Havens, MD     03/25/2020  1:44 PM Patient [name] MRN: [id number] Epilepsy Attending: Lora Havens Referring Physician/Provider: Dr. Rosalin Hawking Date: 03/25/2020 Duration: 25.29 minutes Patient history: 74 year old female presenting with slurred speech, left-sided weakness and ataxia.  MRI brain showed right MCA infarct.  EEG evaluate for seizures. Level of alertness: Awake, asleep AEDs during EEG study: None Technical aspects: This EEG study was done with scalp electrodes positioned according to the 10-20 International system of electrode placement. Electrical activity was acquired at a sampling rate of 500Hz  and reviewed with a high frequency filter of 70Hz  and a low frequency filter of 1Hz . EEG data were recorded continuously and digitally stored. Description: During awake state, no clear posterior dominant rhythm was seen.  Sleep was characterized by vertex, maximal frontocentral region.  EEG showed continuous generalized and lateralized right hemisphere 3 to 6 Hz theta-delta slowing.  Sharp waves were also seen in right frontal region which appeared rhythmic without any clear evolution when patient was stimulated at the end of the study.  Hyperventilation and photic stimulation were not performed.   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  The sharp waves appeared rhythmic when patient was stimulated without any clear evolution and is more likely related to arousal.  No definite seizures were seen during the study.Lora Havens   ECHOCARDIOGRAM  COMPLETE  Result Date: 03/24/2020    ECHOCARDIOGRAM REPORT   Patient Name:   Meredith Shaw Date of Exam: 03/24/2020 Medical Rec #:  [id number]        Height:       67.0 in Accession #:    [id number]  Weight:       169.8 lb Date of Birth:  Feb 05, 1946        BSA:          1.886 m Patient Age:    30 years         BP:           115/66 mmHg Patient Gender: F                HR:           85 bpm. Exam Location:  Inpatient Procedure: 2D Echo, Cardiac Doppler, Color Doppler and Intracardiac            Opacification Agent Indications:    Stroke 434.91 / I163.9  History:        Patient has no prior history of Echocardiogram examinations.                 Risk Factors:Hypertension and Diabetes. Hypothyrodism.  Sonographer:    Tiffany Dance Referring Phys: [id number] R AROOR  Sonographer Comments: Echo performed with patient supine and on artificial respirator. IMPRESSIONS  1. Left ventricular ejection fraction, by estimation, is 70 to 75%. The left ventricle has hyperdynamic function with intracavitary gradient up to 32 mmHg. The left ventricle has no regional wall motion abnormalities. There is mild left ventricular hypertrophy. Left ventricular diastolic parameters are indeterminate.  2. Right ventricular systolic function is normal. The right ventricular size is normal. Tricuspid regurgitation signal is inadequate for assessing PA pressure.  3. The aortic valve is tricuspid. Aortic valve regurgitation is not visualized. Mild aortic valve sclerosis is present, with no evidence of aortic valve stenosis.  4. Aortic dilatation noted. 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  5. The inferior vena cava is normal in size with greater than 50% respiratory variability, suggesting right atrial pressure of 3 mmHg.  6. Appears to be echodensity attached to left atrial side of posterior leaflet of mitral valve (best seen on image 3), but notably only seen on parasternal long axis images. Recomend  further evaluation with with TEE to assess for cardioembolic source of  CVA. The mitral valve is degenerative. Trivial mitral valve regurgitation. FINDINGS  Left Ventricle: Hyperdynamic LV function with intracavitary gradient 32 mmHg. Left ventricular ejection fraction, by estimation, is 70 to 75%. The left ventricle has hyperdynamic function. The left ventricle has no regional wall motion abnormalities. Definity contrast agent was given IV to delineate the left ventricular endocardial borders. The left ventricular internal cavity size was normal in size. There is mild left ventricular hypertrophy. Left ventricular diastolic parameters are indeterminate. Right Ventricle: The right ventricular size is normal. Right vetricular wall thickness was not assessed. Right ventricular systolic function is normal. Tricuspid regurgitation signal is inadequate for assessing PA pressure. Left Atrium: Left atrial size was normal in size. Right Atrium: Right atrial size was normal in size. Pericardium: There is no evidence of pericardial effusion. Mitral Valve: Possible echodensity attached to left atrial side of posterior leaflet of mitral valve (best seen on image 3), but notably only seen on parasternal long axis images. Recomend further evaluation with with TEE to rule out cardioembolic source  of CVA. The mitral valve is degenerative in appearance. Trivial mitral valve regurgitation. Tricuspid Valve: The tricuspid valve is normal in structure. Tricuspid valve regurgitation is not demonstrated. Aortic Valve: The aortic valve is tricuspid. Aortic valve regurgitation is not visualized. Mild aortic valve sclerosis is present, with no evidence of aortic  valve stenosis. Pulmonic Valve: The pulmonic valve was not well visualized. Pulmonic valve regurgitation is not visualized. Aorta: Aortic dilatation noted. 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 Venous: The inferior vena cava is normal in size with greater  than 50% respiratory variability, suggesting right atrial pressure of 3 mmHg. IAS/Shunts: The interatrial septum was not well visualized.  LEFT VENTRICLE PLAX 2D LVIDd:         4.00 cm LVIDs:         2.90 cm LV PW:         1.10 cm LV IVS:        1.00 cm LVOT diam:     1.80 cm LV SV:         86 LV SV Index:   46 LVOT Area:     2.54 cm  RIGHT VENTRICLE             IVC RV Basal diam:  2.10 cm     IVC diam: 1.60 cm RV S prime:     19.60 cm/s TAPSE (M-mode): 1.7 cm LEFT ATRIUM             Index       RIGHT ATRIUM          Index LA diam:        3.40 cm 1.80 cm/m  RA Area:     7.76 cm LA Vol (A2C):   42.5 ml 22.53 ml/m RA Volume:   12.30 ml 6.52 ml/m LA Vol (A4C):   29.3 ml 15.53 ml/m LA Biplane Vol: 37.4 ml 19.83 ml/m  AORTIC VALVE LVOT Vmax:   176.00 cm/s LVOT Vmean:  123.000 cm/s LVOT VTI:    0.338 m  AORTA Ao Root diam: 3.10 cm Ao Asc diam:  3.60 cm MITRAL VALVE MV Area (PHT): 2.60 cm     SHUNTS MV Decel Time: 292 msec     Systemic VTI:  0.34 m MV E velocity: 62.80 cm/s   Systemic Diam: 1.80 cm MV A velocity: 104.00 cm/s MV E/A ratio:  0.60 Oswaldo Milian MD Electronically signed by Oswaldo Milian MD Signature Date/Time: 03/24/2020/10:42:15 AM    Final    ECHO TEE  Result Date: 03/26/2020    TRANSESOPHOGEAL ECHO REPORT   Patient Name:   Meredith Shaw Date of Exam: 03/26/2020 Medical Rec #:  [id number]        Height:       67.0 in Accession #:    [id number]       Weight:       181.7 lb Date of Birth:  1945-12-08        BSA:          1.941 m Patient Age:    72 years         BP:           163/82 mmHg Patient Gender: F                HR:           92 bpm. Exam Location:  Inpatient Procedure: 2D Echo, Cardiac Doppler, Color Doppler and Saline Contrast Bubble            Study Indications:     Stroke  History:         Patient has prior history of Echocardiogram examinations, most                  recent 03/24/2020. Risk Factors:Hypertension  and Diabetes. CKD.  Sonographer:     Clayton Lefort RDCS (AE)  Referring Phys:  [place] Diagnosing Phys: Ena Dawley MD PROCEDURE: After discussion of the risks and benefits of a TEE, an informed consent was obtained from the patient. The transesophogeal probe was passed without difficulty through the esophogus of the patient. Sedation performed by different physician. The patient was monitored while under deep sedation. Anesthestetic sedation was provided intravenously by Anesthesiology: 207.41m of Propofol. Image quality was adequate. The patient's vital signs; including heart rate, blood pressure, and oxygen saturation; remained stable throughout the procedure. The patient developed no complications during the procedure. IMPRESSIONS  1. No mass or vegetations were identified on the mitral or other valves. No thrombus in the left atrial appendage. Negative bubble study.  2. Left ventricular ejection fraction, by estimation, is 70 to 75%. The left ventricle has hyperdynamic function. The left ventricle has no regional wall motion abnormalities. There is mild concentric left ventricular hypertrophy. Left ventricular diastolic function could not be evaluated.  3. Right ventricular systolic function is normal. The right ventricular size is normal.  4. Left atrial size was mildly dilated. No left atrial/left atrial appendage thrombus was detected. The LAA emptying velocity was 60 cm/s.  5. The mitral valve is normal in structure. Mild mitral valve regurgitation. No evidence of mitral stenosis.  6. The aortic valve is normal in structure. Aortic valve regurgitation is mild. No aortic stenosis is present.  7. There is mild (Grade II) plaque.  8. The inferior vena cava is normal in size with greater than 50% respiratory variability, suggesting right atrial pressure of 3 mmHg.  9. Agitated saline contrast bubble study was negative, with no evidence of any interatrial shunt. Conclusion(s)/Recommendation(s): No LA/LAA thrombus identified. Negative bubble study for  interatrial shunt. No intracardiac source of embolism detected on this on this transesophageal echocardiogram. FINDINGS  Left Ventricle: Left ventricular ejection fraction, by estimation, is 70 to 75%. The left ventricle has hyperdynamic function. The left ventricle has no regional wall motion abnormalities. The left ventricular internal cavity size was normal in size. There is mild concentric left ventricular hypertrophy. Left ventricular diastolic function could not be evaluated. Right Ventricle: The right ventricular size is normal. No increase in right ventricular wall thickness. Right ventricular systolic function is normal. Left Atrium: Left atrial size was mildly dilated. No left atrial/left atrial appendage thrombus was detected. The LAA emptying velocity was 60 cm/s. Right Atrium: Right atrial size was normal in size. Pericardium: There is no evidence of pericardial effusion. Mitral Valve: The mitral valve is normal in structure. Normal mobility of the mitral valve leaflets. Mild mitral valve regurgitation. No evidence of mitral valve stenosis. There is no evidence of mitral valve vegetation. Tricuspid Valve: The tricuspid valve is normal in structure. Tricuspid valve regurgitation is mild . No evidence of tricuspid stenosis. There is no evidence of tricuspid valve vegetation. Aortic Valve: The aortic valve is normal in structure. Aortic valve regurgitation is mild. No aortic stenosis is present. There is no evidence of aortic valve vegetation. Pulmonic Valve: The pulmonic valve was normal in structure. Pulmonic valve regurgitation is not visualized. No evidence of pulmonic stenosis. There is no evidence of pulmonic valve vegetation. Aorta: The aortic root is normal in size and structure. There is mild (Grade II) plaque. Venous: The inferior vena cava is normal in size with greater than 50% respiratory variability, suggesting right atrial pressure of 3 mmHg. IAS/Shunts: No atrial level shunt detected by  color flow Doppler. Agitated saline contrast was given intravenously to evaluate for intracardiac shunting. Agitated saline contrast bubble study was negative, with no evidence of any interatrial shunt. Ena Dawley MD Electronically signed by Ena Dawley MD Signature Date/Time: 03/26/2020/3:24:18 PM    Final    Intravitreal Injection, Pharmacologic Agent - OD - Right Eye  Result Date: 03/14/2020 Time Out 03/14/2020. 9:35 AM. Confirmed correct patient, procedure, site, and patient consented. Anesthesia Topical anesthesia was used. Anesthetic medications included Lidocaine 2%, Proparacaine 0.5%. Procedure Preparation included 5% betadine to ocular surface, eyelid speculum. A (32g) needle was used. Injection: 2 mg aflibercept Alfonse Flavors) SOLN   NDC: A3590391, Lot: [id number], Expiration date: 07/07/2020   Route: Intravitreal, Site: Right Eye, Waste: 0.05 mL Post-op Post injection exam found visual acuity of at least counting fingers. The patient tolerated the procedure well. There were no complications. The patient received written and verbal post procedure care education.   OCT, Retina - OU - Both Eyes  Result Date: 03/14/2020 Right Eye Quality was good. Central Foveal Thickness: 266. Progression has been stable. Findings include no SRF, intraretinal fluid, cystoid macular edema, normal foveal contour, intraretinal hyper-reflective material (Persistent IRFsuperior macular ). Left Eye Quality was good. Central Foveal Thickness: 279. Progression has been stable. Findings include normal foveal contour, no IRF, no SRF, vitreomacular adhesion . 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 CME - cystoid macular edema. PED - pigment epithelial detachment. IRF - intraretinal fluid. SRF - subretinal fluid. EZ - ellipsoid zone. ERM -  epiretinal membrane. ORA - outer retinal atrophy. ORT - outer retinal tubulation. SRHM - subretinal hyper-reflective material   IR PERCUTANEOUS ART THROMBECTOMY/INFUSION INTRACRANIAL INC DIAG ANGIO  Result Date: 03/27/2020 INDICATION: New onset confusion and left-sided weakness. 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 EXAM: 1. EMERGENT LARGE VESSEL OCCLUSION THROMBOLYSIS anterior CIRCULATION) COMPARISON:  CT angiogram of the head and neck of March 23, 2020. MEDICATIONS: None ANESTHESIA/SEDATION: General anesthesia CONTRAST:  Isovue 300 approximately 75 mL. FLUOROSCOPY TIME:  Fluoroscopy Time: 56 minutes 42 seconds (1865 mGy). COMPLICATIONS: None immediate. TECHNIQUE: Following a full explanation of the procedure along with the potential associated complications, an informed witnessed consent was obtained spouse and son the risks of intracranial hemorrhage of 10%, worsening neurological deficit, ventilator dependency, death and inability to revascularize were all reviewed in detail with the patient's spouse and son. The patient was then put under general anesthesia by the Department of Anesthesiology at [facility]. The right groin was prepped and draped in the usual sterile fashion. Thereafter using modified Seldinger technique, transfemoral access into the right common femoral artery was obtained without difficulty. Over a 0.035 inch guidewire an 8 French 25 cm Pinnacle sheath was inserted. Through this, and also over a 0.035 inch guidewire a combination of support a 5.5 French catheter inside of an 088 balloon guide catheter which had been prepped with 50% contrast and 50% heparinized saline infusion was advanced to the aortic arch. The guidewire was then advanced distally into the right common carotid artery followed by the formed Simmons support catheter followed by the balloon guide catheter. The combination was navigated without  difficulty to distal cervical right ICA. The guidewire and the 5.5 [place] support Simmons catheter were removed. Good aspiration obtained from the hub of the balloon  guide catheter in the right internal carotid artery. FINDINGS: A control arteriogram performed through this demonstrated patency of the right internal carotid artery distal cervical, petrous cavernous and supraclinoid segments. A right posterior communicating artery is seen opacifying the right posterior cerebral artery distribution. The right middle cerebral artery demonstrates complete occlusion of the distal M1 segment. Opacification of the anterior temporal branch was noted. The right anterior cerebral artery opacifies into the capillary and venous phases. Initial right common carotid arteriogram demonstrated the right external carotid artery and its major branches to be widely patent. The right internal carotid artery at the bulb to the cranial skull base also demonstrates wide patency to the petrous portion. More distally the petrous, the cavernous and the supraclinoid segments demonstrate wide patency. The right posterior communicating artery opacifying the right posterior cerebral artery distribution was noted to be present. PROCEDURE: Through the 088 French balloon guide catheter in the distal right internal carotid artery, a combination of a Zoom 071 guide catheter inside of which was a 021 Trevo ProVue microcatheter was advanced over a 0.014 inch standard Synchro micro guidewire with a J configuration, the combination was navigated without difficulty to the proximal cavernous segment. The micro guidewire was advanced without difficulty to the M2 M3 region of the inferior division of the right middle cerebellar followed by the microcatheter. The guidewire was removed. Good aspiration obtained from the hub of the microcatheter. A gentle control arteriogram performed through the microcatheter demonstrated safe position of the tip of the  microcatheter. At this time, a 5 mm x 37 mm Embotrap device was advanced to the distal end of the microcatheter. The hub on the microcatheter was loosened. With slight forward gentle traction with the right hand on the delivery micro guidewire the microcatheter was retrieved unsheathing the retrieval device. Proximal flow arrest was then initiated in the right internal carotid artery. Multiple attempts were made to advance the 071 Zoom catheter into the right middle cerebral artery. After considerable difficulty this was achieved. The 071 microcatheter was then advanced and positioned at the origin of the occlusion of the right middle cerebral artery distally. With constant aspiration being applied at the hub of the Zoom guide catheter, and hub of the balloon guide catheter with a 60 mL syringe over approximately 2-1/2 minutes, the combination of the retrieval device, the microcatheter and the Zoom catheter were retrieved and removed. Specks of clot were seen intertwined in the retrieval device. Following reversal of flow arrest, a control arteriogram performed through the balloon guide catheter continued to demonstrate no appreciable change in the occluded right middle cerebral M1 segment distally. A second pass was then made at this time using a 6 [place] Catalyst intermediary guide catheter inside of which was a Marksman 160 cm 027 microcatheter over an 016 inch Fathom micro guidewire. Again the microcatheter was positioned in the distal M2 M3 segment of the inferior division. The guidewire was removed. Good aspiration obtained from the hub of the microcatheter which was then connected to continuous heparinized saline infusion. A 4 mm x 40 mm Solitaire X retrieval device was then advanced to the distal end of the microcatheter and deployed in the manner described above. The Catalyst guide catheter was advanced and positioned just inside the origin of the dominant inferior division with achievement of a complete  flow arrest. With Penumbra aspiration being applied at the hub of the Catalyst guide catheter, and proximal flow arrest of the balloon catheter in the right internal carotid artery at 2-1/2  minutes, the combination of the retrieval device, the microcatheter and the 6 Merkel catheter were retrieved and removed. Flow arrest was then reversed. A control arteriogram performed through the balloon guide catheter in the right internal carotid artery demonstrated complete revascularization of the right middle cerebral artery distribution achieving a TICI 3 revascularization. Patency of the right anterior cerebral artery and the right posterior communicating artery was verified to be present. Spasm in the right internal carotid artery and the right internal carotid artery responded to 25 mcg of nitroglycerin intra-arterially. The balloon guide catheter was then retrieved into the right common carotid artery. A control arteriogram performed at this site continued to demonstrate wide patency of the right external carotid artery and its branches, also the right internal carotid artery at the bulb to the cranial skull base demonstrates wide patency. Intracranially no change was seen in the TICI 3 revascularization of the right middle cerebral artery distribution. The balloon guide catheter was retrieved and removed. A CT scan of the brain performed demonstrated no evidence of hemorrhage, mass or midline shift. The 8 French Pinnacle sheath was then removed with the successful application of an 8 French Angio-Seal closure device in the right groin. The groin appeared soft without evidence of hematoma or bleeding. Distal pulses remained Dopplerable in the posterior tibial, and the dorsalis pedis regions bilaterally unchanged. The patient was the left intubated because of the patient's neurological condition, and in order to protect the patient's airway. Patient was then transferred to the neuro ICU intubated for post  thrombectomy management. IMPRESSION: Status post endovascular complete revascularization of the occluded right middle cerebral M1 segment with 1 pass with the 5 mm x 37 mm Embotrap retrieval device, and 1 pass with the Solitaire 4 mm x 40 mm X retrieval device and Penumbra aspiration achieving a TICI 3 revascularization. PLAN: Follow-up in the clinic 4 weeks post discharge. Electronically Signed   By: Luanne Bras M.D.   On: 03/25/2020 10:53   CT HEAD CODE STROKE WO CONTRAST  Result Date: 03/23/2020 CLINICAL DATA:  Code stroke.  Left arm weakness and facial droop. EXAM: CT HEAD WITHOUT CONTRAST TECHNIQUE: Contiguous axial images were obtained from the base of the skull through the vertex without intravenous contrast. COMPARISON:  None. FINDINGS: Brain: There is no mass, hemorrhage or extra-axial collection. The size and configuration of the ventricles and extra-axial CSF spaces are normal. There is hypoattenuation of the periventricular white matter, most commonly indicating chronic ischemic microangiopathy. Vascular: No abnormal hyperdensity of the major intracranial arteries or dural venous sinuses. No intracranial atherosclerosis. Skull: The visualized skull base, calvarium and extracranial soft tissues are normal. Sinuses/Orbits: No fluid levels or advanced mucosal thickening of the visualized paranasal sinuses. No mastoid or middle ear effusion. The orbits are normal. ASPECTS [facility] Stroke Program Early CT Score) - Ganglionic level infarction (caudate, lentiform nuclei, internal capsule, insula, M1-M3 cortex): 7 - Supraganglionic infarction (M4-M6 cortex): 3 Total score (0-10 with 10 being normal): 10 IMPRESSION: 1. No acute intracranial abnormality 2. ASPECTS is 10. These results were communicated to Dr. Karena Addison Aroor at 10:41 pm on 03/23/2020 by text page via the [facility] messaging system. Electronically Signed   By: Ulyses Jarred M.D.   On: 03/23/2020 22:41   VAS [place] LOWER EXTREMITY VENOUS  (DVT)  Result Date: 03/24/2020  Lower Venous DVTStudy Indications: Stroke.  Limitations: Patient positioning. Comparison Study: 05/26/18 previous Performing Technologist: Abram Sander RVS  Examination Guidelines: A complete evaluation includes B-mode imaging, spectral Doppler, color Doppler,  and power Doppler as needed of all accessible portions of each vessel. Bilateral testing is considered an integral part of a complete examination. Limited examinations for reoccurring indications may be performed as noted. The reflux portion of the exam is performed with the patient in reverse Trendelenburg.  +---------+---------------+---------+-----------+----------+--------------+ RIGHT    CompressibilityPhasicitySpontaneityPropertiesThrombus Aging +---------+---------------+---------+-----------+----------+--------------+ CFV      Full           Yes      Yes                                 +---------+---------------+---------+-----------+----------+--------------+ SFJ      Full                                                        +---------+---------------+---------+-----------+----------+--------------+ FV Prox  Full                                                        +---------+---------------+---------+-----------+----------+--------------+ FV Mid   Full                                                        +---------+---------------+---------+-----------+----------+--------------+ FV DistalFull                                                        +---------+---------------+---------+-----------+----------+--------------+ PFV      Full                                                        +---------+---------------+---------+-----------+----------+--------------+ POP      Full           Yes      Yes                                 +---------+---------------+---------+-----------+----------+--------------+ PTV      Full                                                         +---------+---------------+---------+-----------+----------+--------------+ PERO     Full                                                        +---------+---------------+---------+-----------+----------+--------------+   +---------+---------------+---------+-----------+----------+--------------+  LEFT     CompressibilityPhasicitySpontaneityPropertiesThrombus Aging +---------+---------------+---------+-----------+----------+--------------+ CFV      Full           Yes      Yes                                 +---------+---------------+---------+-----------+----------+--------------+ SFJ      Full                                                        +---------+---------------+---------+-----------+----------+--------------+ FV Prox  Full                                                        +---------+---------------+---------+-----------+----------+--------------+ FV Mid   Full                                                        +---------+---------------+---------+-----------+----------+--------------+ FV Distal               Yes      Yes                                 +---------+---------------+---------+-----------+----------+--------------+ PFV      Full                                                        +---------+---------------+---------+-----------+----------+--------------+ POP      Full           Yes      Yes                                 +---------+---------------+---------+-----------+----------+--------------+ PTV      Full                                                        +---------+---------------+---------+-----------+----------+--------------+ PERO                                                  Not visualized +---------+---------------+---------+-----------+----------+--------------+     Summary: BILATERAL: - No evidence of deep vein thrombosis seen in the lower extremities,  bilaterally. -   *See table(s) above for measurements and observations. Electronically signed by Curt Jews MD on 03/24/2020 at 5:33:26 PM.    Final     12-lead ECG on arrival showed sinus tachycardia at 101 bpm,  QRS 97 bpm (personally reviewed) All prior EKG's in EPIC reviewed with no documented atrial fibrillation. NSR and sinus brady  Telemetry SR, telemetry only goes back to 04/04/2020, no further historical telemetry is available at this time for review, though our service had seen her initially on 6/22, up to then she had not been noted to have any AFib (personally reviewed)  Assessment and Plan:  1. Cryptogenic stroke The patient presents with cryptogenic stroke.  The patient  had an unremarkable TEE earlier this admission.  I spoke at length with the patient about monitoring for afib with an implantable loop recorder as well as her daughter Clarene Critchley (via telephone)  The Allayne Stack and her daughter are agreeable to proceed.  Risks, benefits, and alteratives to implantable loop recorder were discussed with the patient and her daughter as well today.   At this time, the patient and her daughter are very clear in the decision to proceed with implantable loop recorder.   Wound care was reviewed with the patient (keep incision clean and dry for 3 days) and her daughter.  Wound check Ellia Knowlton be scheduled and entered in AVS.   Please call with questions.     Shirley Friar, PA-C 04/01/2020 9:29 AM  I have seen and examined this patient with Tommye Standard.  Agree with above, note added to reflect my findings.  On exam, RRR, no murmurs.  Patient presented to the hospital with cryptogenic stroke. To date, no cause has been found. TEE planned for today. If unrevealing, Warner Laduca plan for LINQ monitor to look for atrial fibrillation. Risks and benefits discussed. Risks include but not limited to bleeding and infection. The patient understands the risks and has agreed to the procedure.  Orah Sonnen M. Herberto Ledwell  MD 04/11/2020 5:04 PM

## 2020-04-02 ENCOUNTER — Inpatient Hospital Stay (HOSPITAL_COMMUNITY): Payer: Medicare Other

## 2020-04-02 LAB — GLUCOSE, CAPILLARY
Glucose-Capillary: 228 mg/dL — ABNORMAL HIGH (ref 70–99)
Glucose-Capillary: 231 mg/dL — ABNORMAL HIGH (ref 70–99)
Glucose-Capillary: 313 mg/dL — ABNORMAL HIGH (ref 70–99)
Glucose-Capillary: 322 mg/dL — ABNORMAL HIGH (ref 70–99)
Glucose-Capillary: 319 mg/dL — ABNORMAL HIGH (ref 70–99)
Glucose-Capillary: 308 mg/dL — ABNORMAL HIGH (ref 70–99)

## 2020-04-02 MED ORDER — RESOURCE THICKENUP CLEAR PO POWD
Freq: Once | ORAL | Status: AC
  Filled 2020-04-02: qty 125

## 2020-04-02 NOTE — Progress Notes (Signed)
Inpatient Diabetes Program Recommendations  AACE/ADA: New Consensus Statement on Inpatient Glycemic Control (2015)  Target Ranges:  Prepandial:   less than 140 mg/dL      Peak postprandial:   less than 180 mg/dL (1-2 hours)      Critically ill patients:  140 - 180 mg/dL   Lab Results  Component Value Date   GLUCAP 228 (H) 04/02/2020   HGBA1C 8.9 (H) 03/24/2020    Review of Glycemic Control Results for Meredith Shaw, Meredith Shaw (MRN [id number]) as of 04/02/2020 07:22  Ref. Range 04/01/2020 11:50 04/01/2020 15:43 04/01/2020 19:56 04/01/2020 23:37 04/02/2020 03:52  Glucose-Capillary Latest Ref Range: 70 - 99 mg/dL 573 (H) 220 (H) 254 (H) 274 (H) 228 (H)   Current diabetes medications: Novolog 0-20 units q4h  Novolog 4 units q4h tube feed coverage Levemir 15 units bid Vital 1.5 @60ml /hr  Inpatient Diabetes Program Recommendations:     Novolog 8 units qh4 tube feed coverage-stop if tube feeds held or discontinued  Will continue to follow while inpatient.  Thank you, , RN, BSN Diabetes Coordinator Inpatient Diabetes Program [phone] (team pager from 8a-5p)

## 2020-04-02 NOTE — Progress Notes (Signed)
STROKE TEAM PROGRESS NOTE   INTERVAL HISTORY Her husband is at the bedside.  Patient more arousable arousable and interactive today.  Blood sugars remain   but no fever  Vitals:   04/01/20 2341 04/02/20 0353 04/02/20 0919 04/02/20 1149  BP: (!) 166/93 (!) 119/106 (!) 162/85 139/81  Pulse: 90 87 92 85  Resp: 18 17 18 18   Temp: 98 F (36.7 C) 97.7 F (36.5 C) 98.4 F (36.9 C) 99 F (37.2 C)  TempSrc:  Oral  Oral  SpO2: 94% 99% 97% 98%  Weight:       CBC:  Recent Labs  Lab 03/29/20 0437 04/01/20 0554  WBC 11.1* 15.6*  HGB 11.6* 12.4  HCT 35.6* 38.2  MCV 89.0 89.9  PLT 305 450*   Basic Metabolic Panel:  Recent Labs  Lab 03/28/20 0408 03/28/20 0408 03/29/20 0437 03/29/20 0437 03/30/20 1111 03/31/20 0454 04/01/20 0554  NA 140   < > 137  --   --   --  143  K 4.4   < > 4.3  --   --   --  4.6  CL 108   < > 104  --   --   --  107  CO2 22   < > 22  --   --   --  24  GLUCOSE 293*   < > 387*  --   --   --  207*  BUN 22   < > 30*  --   --   --  47*  CREATININE 1.12*   < > 1.15*  --   --   --  1.51*  CALCIUM 8.4*   < > 8.6*  --   --   --  9.4  MG 1.8  --   --   --   --  2.0  --   PHOS 2.3*   < > 2.4*   < > 1.5* 4.6  --    < > = values in this interval not displayed.    IMAGING past 24 hours No results found.  PHYSICAL EXAM      General - Well nourished, well developed, drowsy sleepy.   04/03/20  Ophthalmologic - fundi not visualized due to noncooperation.  Cardiovascular - Regular rhythm and rate.  Neuro - patient is awake.  She speaks short sentences and follows simple midline commands.. Eyes right gaze preference, not crossing midline. PERRL. Not blinking to visual threat bilaterally. Left facial droop. Tongue protrusion not cooperative. LUE flaccid, mild withdraw to pain. LLE 1/5 withdraw on pain stimulation. RUE spontaneous against gravity and purposeful. RLE 3/5 on pain stimulation.  She is noted to have frequent flexion movement of the right upper extremity and right  leg.  It seems semirhythmic and most consistent with a periodic limb movements.  Sensation, coordination and gait not tested   ASSESSMENT/PLAN Ms. VETRA SHINALL is a 74 y.o. female with history of HTN, HLD, DB presenting with slurred speech, L sided weakness and ataxia. Received IV tPA 03/23/2020 at 2252. Worsening in ED. CTA confirmed distal R M1 occlusion. Taken to IR.    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 ASPECTS 10.     CTA head & neck distal R M2 occlusion w/ moderate collateral flow R MCA territory.   Cerebral angio angio w/ TICI3 revascularization R M1  occlusion   Post IR CT no ICH or mass effect   CT head 6/14 0234 no acute abnormality. Small vessel disease.   CT head 6/14 1232 R MCA territory infarct sparing basal ganglia  CT head 6/14 2159 progressive R MCA territory infarct w/ basal ganglia sparing. Hyperdense R M2  MRI  Large R MCA territory infarct w/ petechial hemorrhage. Scattered small R PCA territory infarcts. Punctate poster L periventricular WM infarct. Old B basal ganglia and thalamic lacunes.   MRA  Reocclusion distal R M1  EEG R frontal sharp waves, generalized and lateralized R brain slowing  LE Doppler  No DVT  2D Echo EF 70-75%. No obvious source of embolus. ?Echodensity L side of MV  TEE normal MV, EF 70-75%, no PFO  Plan loop recorder before d/c to CIR  TG>500->247, Direct LDL 63.8  HgbA1c 8.9  Heparin 5000 units sq tid for VTE prophylaxis  aspirin 81 mg daily prior to admission, now on ASA 325 & Plavix 75 DAPT for 3 months and then plavix alone given right MCA reocclusion.   Therapy recommendations:  CIR vs SNF  Disposition:  pending   Acute Respiratory Failure, resolved  Intubated for IR, left intubated d/t slow response to reversal and involuntary flapping of RUE   Extubated 6/14  followed by coffee ground emesis   Bronchial hygeine  Was on nasal trumpet -> now off  Encephalopathy Emesis post extubation  Fever, Elevated Lactate  Coffee ground emesis s/p extubation  TMax  afebrile  Hemodynamically stable  KUB w/o ileus, + hydroureteronephrosis   UA w/ WBC > 50 & UCx E coli  BCx no growth   Trop 28->45   Lactic acid normal  Cefepime 6/14>>6/15  Meropenem 6/15>>6/17  keflex 6/17>>6/21  Hypertension  Home meds:  amlodipine 5, lisinopril 20, metoprolol 100 bid  . On Cleviprex -> off  . SBP goal now < 180 . On amlodipine 10 . Long-term BP goal 130-150 given right MCA reocclusion (SBP 131 - 189 today)  Hyperlipidemia  Home meds:  pravachol 40  TG>500->247, Direct LDL 63.8, goal < 70  Resumed pravastatin 40  Add zetia 10  Continue statin and zetia at discharge  Diabetes type II Uncontrolled  Home meds:  trulicity 0.75, glimepiride 1, metformin 500 bid, ozempic 0.25-0.5  HgbA1c 8.9, goal < 7.0  On TF  CBGs  SSI  Diabetic coordinator following  increase Levemir to 15u Bid and 4u q4h on TF  Close PCP follow up for better DM control  Dysphagia . Secondary to stroke . NPO . Cortrak placed . TF @ 55 and NS @ 20 . Speech on board   Lethargy   Fluctuate MS prior  Continues to have increased lethargy  Increased amantadine to 150 bid 6/21 - altered times to 0600 & 1400 -  Suspect the patient likely has sleep apnea.  AutoPap will be used at nighttime. (pt refused)  More awake today  Other Stroke Risk Factors  Advanced age  Family hx stroke (father)  Other Active Problems  Leukocytosis, WBC 11.1->15.6   AKI on CRF, Cre 1.51 renal US +B cysts, no obstruction. Plans for OP nephrology PTA.   Hypokalemia 3.4 - supplement - 4.6 - resolved  Hypophosphatemia - 2.3->2.4->1.5   Mild anemia - 11.2->11.6->12.4  Suspected severe case of restless leg syndrome.  started on Requip twice a day. ferritin level  normal  Hospital day # 9  Patient is more arousable today.  S .  Continue amantadine for  arousal and Requip for restless legs.  Long discussion with patient and husband and  answered questions.  Continue ongoing therapies and transfer to inpatient rehab when bed available hopefully over the next few days. Antony Contras, MD To contact Stroke Continuity provider, please refer to [email or web address] After hours, contact General Neurology

## 2020-04-02 NOTE — Progress Notes (Signed)
Inpatient Rehabilitation Admissions Coordinator  I met at bedside with patient and her spouse. Patient is more alert today. I will begin insurance authorization with [facility] for a possible admit pending their approval.  Danne Baxter, RN, MSN Rehab Admissions Coordinator [phone] 04/02/2020 11:45 AM

## 2020-04-02 NOTE — Progress Notes (Signed)
Physical Therapy Treatment Patient Details [name] MRN: [id number] DOB: [birth year] Today's Date: 04/02/2020    History of Present Illness 74 y.o female admitted with sudden onset Lt sided weakness and surred speech.  Tpa was administered.  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 pt extubated 6/14, but had increasing lethsrgy > possibly septic due to UTI.  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  PMH includes: hypertensive retinopathy, DM, arthritis, s/p TKA, s/p ORIF Rt hip, s/p ORIF LT hip.    PT Comments    Patient seen for mobility progression. Pt is drowsy upon arrival but more alert once mobilizing. Pt is oriented to place but not time and follows single step commands inconsistently with multimodal cues. Pt requires total A +2 for bed mobility and able to sit EOB with mod A and single UE support. Pt with decreased sensation and flexor tone L UE/LE and involuntary jerking R UE/LE. Pt continues to present with R side gaze preference and with assistance and cues able to track to midline briefly during session.  PT will continue to follow acutely and progress as tolerated.    Follow Up Recommendations  CIR     Equipment Recommendations  Hospital bed;Other (comment) (mechanical lift if d/c home, other equipment TBD)    Recommendations for Other Services Rehab consult     Precautions / Restrictions Precautions Precautions: Fall Precaution Comments: L hemi/neglect Restrictions Weight Bearing Restrictions: No    Mobility  Bed Mobility Overal bed mobility: Needs Assistance Bed Mobility: Supine to Sit;Sit to Supine     Supine to sit: +2 for physical assistance;Total assist;HOB elevated Sit to supine: Total assist;+2 for physical assistance   General bed mobility comments: multimodal cues for sequencing; when attempting to bring R LE to EOB limited by  involuntary jerking into flexion and assist required to abduct; use of bed pad to bring hips to EOB and to elevate trunk into sitting   Transfers Overall transfer level: Needs assistance Equipment used: Ambulation equipment used Transfers: Sit to/from Stand;Lateral/Scoot Transfers Sit to Stand: +2 physical assistance;Total assist;From elevated surface         General transfer comment: attempted X 2 to stand without success; multimodal cues and use of gait belt and bed pad   Ambulation/Gait             General Gait Details: unable   Stairs             Wheelchair Mobility    Modified Rankin (Stroke Patients Only) Modified Rankin (Stroke Patients Only) Pre-Morbid Rankin Score: No symptoms Modified Rankin: Severe disability     Balance Overall balance assessment: Needs assistance Sitting-balance support: Single extremity supported;Feet supported Sitting balance-Leahy Scale: Poor Sitting balance - Comments: pt able to maintain sitting balance EOB with single UE support and mod A; cues for weight shifting                                     Cognition Arousal/Alertness: Lethargic (periods of wakefulness and drowsniess) Behavior During Therapy: Flat affect Overall Cognitive Status: Impaired/Different from baseline Area of Impairment: Attention;Memory;Following commands;Safety/judgement;Problem solving                   Current Attention Level: Sustained Memory: Decreased recall of precautions;Decreased short-term memory Following Commands: Follows one step commands with  increased time;Follows one step commands inconsistently Safety/Judgement: Decreased awareness of safety;Decreased awareness of deficits   Problem Solving: Slow processing;Requires verbal cues;Decreased initiation;Difficulty sequencing;Requires tactile cues        Exercises      General Comments        Pertinent Vitals/Pain Pain Assessment: No/denies pain    Home  Living                      Prior Function            PT Goals (current goals can now be found in the care plan section) Progress towards PT goals: Progressing toward goals    Frequency    Min 4X/week      PT Plan Current plan remains appropriate    Co-evaluation              AM-PAC PT "6 Clicks" Mobility   Outcome Measure  Help needed turning from your back to your side while in a flat bed without using bedrails?: Total Help needed moving from lying on your back to sitting on the side of a flat bed without using bedrails?: Total Help needed moving to and from a bed to a chair (including a wheelchair)?: Total Help needed standing up from a chair using your arms (e.g., wheelchair or bedside chair)?: Total Help needed to walk in hospital room?: Total Help needed climbing 3-5 steps with a railing? : Total 6 Click Score: 6    End of Session Equipment Utilized During Treatment: Gait belt Activity Tolerance: Patient tolerated treatment well Patient left: with call bell/phone within reach;in bed;with bed alarm set Nurse Communication: Mobility status;Need for lift equipment PT Visit Diagnosis: Other abnormalities of gait and mobility (R26.89);Muscle weakness (generalized) (M62.81);History of falling (Z91.81);Other symptoms and signs involving the nervous system (R29.898)     Time: 1424-1450 PT Time Calculation (min) (ACUTE ONLY): 26 min  Charges:  $Therapeutic Activity: 8-22 mins $Neuromuscular Re-education: 8-22 mins                     Erline Levine, PTA Acute Rehabilitation Services Pager: [phone] Office: [phone]     Carolynne Edouard 04/02/2020, 4:32 PM

## 2020-04-02 NOTE — Progress Notes (Signed)
Modified Barium Swallow Progress Note  Patient Details  [name] MRN: [id number] Date of Birth: [birth year]  Today's Date: 04/02/2020  Modified Barium Swallow completed.  Full report located under Chart Review in the Imaging Section.  Brief recommendations include the following:  Clinical Impression  Pt presents with persisting dysphagia marked by oral holding, decreased initiation, and anterior left spillage and residue.  There was continued silent, trace aspiration of nectar thick liquids before the swallow; honey thick liquids were not aspirated. There was good pharyngeal clearance/no residue post-swallow.  Overall function is also impacted by cognition, inattention.  For now, recommend initiating a dysphagia 1 diet with honey-thick liquids; crush meds in puree.  Maintain cortrak until oral intake improves.  SLP will follow for safety/therapeutic exercise and diet advancement.  Pt's dtr, [name], was present for study; we reviewed findings and recommendations.     Swallow Evaluation Recommendations       SLP Diet Recommendations: Dysphagia 1 (Puree) solids;Nectar thick liquid   Liquid Administration via: Cup;Spoon   Medication Administration: Crushed with puree   Supervision: Full assist for feeding   Compensations: Slow rate;Small sips/bites;Lingual sweep for clearance of pocketing       Oral Care Recommendations: Oral care BID   Other Recommendations: Order thickener from pharmacy   Codi Kertz L. Samson Frederic, MA CCC/SLP Acute Rehabilitation Services Office number [phone] Pager [phone]  Blenda Mounts Laurice 04/02/2020,1:48 PM

## 2020-04-03 LAB — GLUCOSE, CAPILLARY
Glucose-Capillary: 162 mg/dL — ABNORMAL HIGH (ref 70–99)
Glucose-Capillary: 218 mg/dL — ABNORMAL HIGH (ref 70–99)
Glucose-Capillary: 229 mg/dL — ABNORMAL HIGH (ref 70–99)
Glucose-Capillary: 237 mg/dL — ABNORMAL HIGH (ref 70–99)
Glucose-Capillary: 242 mg/dL — ABNORMAL HIGH (ref 70–99)
Glucose-Capillary: 288 mg/dL — ABNORMAL HIGH (ref 70–99)

## 2020-04-03 MED ORDER — ADULT MULTIVITAMIN W/MINERALS CH
1.0000 | ORAL_TABLET | Freq: Every day | ORAL | Status: DC
  Administered 2020-04-03 – 2020-04-11 (×9): 1 via ORAL
  Filled 2020-04-03 (×9): qty 1

## 2020-04-03 MED ORDER — ENSURE MAX PROTEIN PO LIQD
11.0000 [oz_av] | Freq: Three times a day (TID) | ORAL | Status: DC
  Administered 2020-04-03 – 2020-04-04 (×4): 11 [oz_av] via ORAL
  Filled 2020-04-03 (×4): qty 330

## 2020-04-03 MED ORDER — INSULIN DETEMIR 100 UNIT/ML ~~LOC~~ SOLN
10.0000 [IU] | Freq: Two times a day (BID) | SUBCUTANEOUS | Status: DC
  Administered 2020-04-03 – 2020-04-11 (×16): 10 [IU] via SUBCUTANEOUS
  Filled 2020-04-03 (×17): qty 0.1

## 2020-04-03 NOTE — Progress Notes (Signed)
Nutrition Follow-up  DOCUMENTATION CODES:   Not applicable  INTERVENTION:  -Calorie count per MD -Ensure Max po TID, each supplement provides 150 kcal and 30 grams of protein (thickened appropriately) -Magic cup TID with meals, each supplement provides 290 kcal and 9 grams of protein -MVI daily  Would not recommend pulling Cortrak until patient can consistently meet >75% estimated needs via po intake.    NUTRITION DIAGNOSIS:   Increased nutrient needs related to acute illness as evidenced by estimated needs.  Ongoing.  GOAL:   Patient will meet greater than or equal to 90% of their needs  Progressing.   MONITOR:   Labs, Weight trends  REASON FOR ASSESSMENT:   Consult Enteral/tube feeding initiation and management  ASSESSMENT:   Pt admitted with acute R MCA infarct s/p TPA and IR revascularization and possible MV vegetation. PMH includes DM, HTN, and hypothyroidism.  6/13 intubated 6/14 extubated 6/16 Cortrak placed (gastric) 6/23 s/p MBS, Dysphagia 1 diet with Nectar thick liquids  RD received consult for calorie count. Discussed pt with RN. Noted that tube feeds were discontinued. Would not recommend pulling cortrak until pt can consistently meet >75% estimated needs po.   PO Intake: 25% x1 recorded meal since diet advancement  Labs: CBGs [id number] (Diabetes Coordinator following) Medications: Colace, 5ml Pro-stat daily (from TF regimen), free water Q4H (from TF regimen), Novolog, Levemir, Protonix, Miralax  Diet Order:   Diet Order            DIET - DYS 1 Room service appropriate? Yes with Assist; Fluid consistency: Honey Thick  Diet effective now                 EDUCATION NEEDS:   No education needs have been identified at this time  Skin:  Skin Assessment: Skin Integrity Issues: Skin Integrity Issues:: Stage I, Incisions Stage I: sacrum Incisions: R groin  Last BM:  6/23  Height:   Ht Readings from Last 1 Encounters:  02/21/20 5\' 7"   (1.702 m)    Weight:   Wt Readings from Last 1 Encounters:  04/03/20 77.9 kg    BMI:  Body mass index is 26.9 kg/m.  Estimated Nutritional Needs:   Kcal:  1750-1950  Protein:  90-105 grams  Fluid:  >1.7L/d    04/05/20, MS, RD, LDN RD pager number and weekend/on-call pager number located in Amion.

## 2020-04-03 NOTE — Plan of Care (Signed)
  Problem: Education: Goal: Knowledge of disease or condition will improve Outcome: Progressing   Problem: Activity: Goal: Risk for activity intolerance will decrease Outcome: Progressing   Problem: Nutrition: Goal: Adequate nutrition will be maintained Outcome: Progressing   Problem: Coping: Goal: Level of anxiety will decrease Outcome: Progressing

## 2020-04-03 NOTE — Progress Notes (Signed)
Inpatient Rehabilitation Admissions Coordinator  I met at bedside with Meredith Shaw and spouse. We discussed cost of care for possible CIR admit. Daughter request to speak to SW in case CIR not approved, they would want to pursue SNF. I updated SW, Kathlee Nations. We will follow up once insurance makes decision on CIR request.  Danne Baxter, RN, MSN Rehab Admissions Coordinator [phone] 04/03/2020 12:38 PM

## 2020-04-03 NOTE — TOC Initial Note (Signed)
Transition of Care [facility]) - Initial/Assessment Note    Patient Details  [name] MRN: [id number] Date of Birth: 1946/02/04  Transition of Care [facility]) CM/SW Contact:    Geralynn Ochs, LCSW Phone Number: 04/03/2020, 12:55 PM  Clinical Narrative:     CSW alerted by Rehab Admissions that insurance is pending, but unsure whether approval will be granted. Family would like information on other options just in case. CSW met with patient's daughter and spouse at bedside, provided CMS list for SNF. Daughter asked about SNF vs HH, and CSW explained differences and expectations. Family is hopeful for IP Rehab, that is their first choice, but will be agreeable to SNF if insurance denies CIR. CSW to follow.              Expected Discharge Plan: IP Rehab Facility Barriers to Discharge: Ship broker, Continued Medical Work up   Patient Goals and CMS Choice Patient states their goals for this hospitalization and ongoing recovery are:: patient unable to participate in goal setting due to disorientation CMS Medicare.gov Compare Post Acute Care list provided to:: Patient Represenative (must comment) Choice offered to / list presented to : Adult Children, Spouse  Expected Discharge Plan and Services Expected Discharge Plan: [place]     Post Acute Care Choice: IP Rehab Living arrangements for the past 2 months: Single Family Home                                      Prior Living Arrangements/Services Living arrangements for the past 2 months: Single Family Home Lives with:: Spouse Patient language and need for interpreter reviewed:: No Do you feel safe going back to the place where you live?: Yes      Need for Family Participation in Patient Care: Yes (Comment) Care giver support system in place?: No (comment) Current home services: DME Criminal Activity/Legal Involvement Pertinent to Current Situation/Hospitalization: No - Comment as  needed  Activities of Daily Living      Permission Sought/Granted Permission sought to share information with : Family Supports Permission granted to share information with : Yes, Verbal Permission Granted  Share Information with NAME: Bernie Covey     Permission granted to share info w Relationship: Daughter, Spouse     Emotional Assessment Appearance:: Appears stated age Attitude/Demeanor/Rapport: Unable to Assess Affect (typically observed): Unable to Assess Orientation: : Oriented to Place, Oriented to Self Alcohol / Substance Use: Not Applicable Psych Involvement: No (comment)  Admission diagnosis:  Stroke [facility]) [I63.9] Acute ischemic right MCA stroke ([place]) [I63.511] Acute right arterial ischemic stroke, middle cerebral artery (MCA) ([place]) [I63.511] Middle cerebral artery embolism, right [I66.01] Patient Active Problem List   Diagnosis Date Noted  . AKI (acute kidney injury) ([place])   . Acute ischemic right MCA stroke ([place]) 03/24/2020  . 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 Acute respiratory failure ([place])   . Acquired hypothyroidism 08/15/2019  . Hyperlipidemia associated with type 2 diabetes mellitus ([place]) 05/14/2019  . Diabetic retinopathy ([place]) 11/09/2018  . Closed fracture of femur, intertrochanteric, right, initial encounter ([place]) 08/19/2018  . Type 2 diabetes mellitus with complication, without long-term current use of insulin ([place]) 02/15/2018  . Closed left hip fracture ([place]) 02/10/2018  . CKD stage 3 due to type 2 diabetes mellitus ([place]) 02/10/2018  . Hip fracture ([place]) 02/10/2018  . Pressure injury of skin 02/10/2018  . Arthritis of  left knee 04/09/2014  . Status post total knee replacement 04/09/2014  . Arthropathy   . Hypertension associated with type 2 diabetes mellitus ([place])   . Kidney stones   . Postablative hypothyroidism   . Degenerative arthritis of knee 06/09/2012  . Flatulence, eructation, and gas pain 12/24/2005  . Abdominal or  pelvic swelling, mass, or lump, other specified site 12/24/2005  . Age-related osteoporosis with current pathological fracture with routine healing 12/04/2004  . Symptomatic menopausal or female climacteric states 12/04/2004  . Arthropathy, lower leg 04/21/2004  . Hematuria 09/17/2003  . Other long term (current) drug therapy 07/05/2002   PCP:  Chevis Pretty, [place] Pharmacy:   [address] [place] [address] Phone: [phone] Fax: [phone]     Social Determinants of Health (SDOH) Interventions    Readmission Risk Interventions No flowsheet data found.

## 2020-04-03 NOTE — Progress Notes (Addendum)
Physical Therapy Treatment Patient Details [name] MRN: [id number] DOB: Jun 02, 1946 Today's Date: 04/03/2020    History of Present Illness 74 y.o female admitted with sudden onset Lt sided weakness and surred speech.  Tpa was administered.  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 pt extubated 6/14, but had increasing lethsrgy > possibly septic due to UTI.  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  PMH includes: hypertensive retinopathy, DM, arthritis, s/p TKA, s/p ORIF Rt hip, s/p ORIF LT hip.    PT Comments    Patient seen for mobility progression. Pt lethargic but arousable with brief periods of alertness throughout session. Pt continues to present with L hemiplegia, L neglect, and R gaze preference. Decreased flexor synergy pattern noted on L side this session and able to work UE/LE into extension while working on bed mobility and sitting balance EOB. PT will continue to progress as tolerated.     Follow Up Recommendations  CIR     Equipment Recommendations  Hospital bed;Other (comment) (mechanical lift if d/c home, other equipment TBD)    Recommendations for Other Services Rehab consult     Precautions / Restrictions Precautions Precautions: Fall Precaution Comments: L hemi/neglect Restrictions Weight Bearing Restrictions: No    Mobility  Bed Mobility Overal bed mobility: Needs Assistance Bed Mobility: Supine to Sit;Sit to Supine     Supine to sit: +2 for physical assistance;Total assist;HOB elevated Sit to supine: Total assist;+2 for physical assistance   General bed mobility comments: multimodal cues for sequencing; total A for all aspects of bed mobility; use of bed pad   Transfers                    Ambulation/Gait                 Stairs             Wheelchair Mobility    Modified Rankin (Stroke Patients  Only) Modified Rankin (Stroke Patients Only) Pre-Morbid Rankin Score: No symptoms Modified Rankin: Severe disability     Balance Overall balance assessment: Needs assistance Sitting-balance support: Single extremity supported;Feet supported;Bilateral upper extremity supported Sitting balance-Leahy Scale: Poor Sitting balance - Comments: poor-zero; varying levels of assist needed for sitting balance EOB; worked on midline posture/gaze and lateral pushups                                    Cognition Arousal/Alertness: Lethargic (periods of wakefulness and drowsniess) Behavior During Therapy: Flat affect Overall Cognitive Status: Impaired/Different from baseline Area of Impairment: Attention;Memory;Following commands;Safety/judgement;Problem solving                   Current Attention Level: Sustained Memory: Decreased recall of precautions;Decreased short-term memory Following Commands: Follows one step commands with increased time;Follows one step commands inconsistently Safety/Judgement: Decreased awareness of safety;Decreased awareness of deficits   Problem Solving: Slow processing;Requires verbal cues;Decreased initiation;Difficulty sequencing;Requires tactile cues        Exercises      General Comments        Pertinent Vitals/Pain Pain Assessment: No/denies pain    Home Living                      Prior Function            PT  Goals (current goals can now be found in the care plan section) Progress towards PT goals: Progressing toward goals    Frequency    Min 4X/week      PT Plan Current plan remains appropriate    Co-evaluation              AM-PAC PT "6 Clicks" Mobility   Outcome Measure  Help needed turning from your back to your side while in a flat bed without using bedrails?: Total Help needed moving from lying on your back to sitting on the side of a flat bed without using bedrails?: Total Help needed  moving to and from a bed to a chair (including a wheelchair)?: Total Help needed standing up from a chair using your arms (e.g., wheelchair or bedside chair)?: Total Help needed to walk in hospital room?: Total Help needed climbing 3-5 steps with a railing? : Total 6 Click Score: 6    End of Session   Activity Tolerance: Patient tolerated treatment well Patient left: with call bell/phone within reach;in bed;with bed alarm set;with family/visitor present Nurse Communication: Mobility status;Need for lift equipment PT Visit Diagnosis: Other abnormalities of gait and mobility (R26.89);Muscle weakness (generalized) (M62.81);History of falling (Z91.81);Other symptoms and signs involving the nervous system (R29.898)     Time: 2683-4196 PT Time Calculation (min) (ACUTE ONLY): 25 min  Charges:  $Therapeutic Activity: 8-22 mins $Neuromuscular Re-education: 8-22 mins                     Earney Navy, PTA Acute Rehabilitation Services Pager: [phone] Office: [phone]     Darliss Cheney 04/03/2020, 4:55 PM

## 2020-04-03 NOTE — Progress Notes (Signed)
Holding off on cpap for the night. RT will continue to monitor as needed.

## 2020-04-03 NOTE — Progress Notes (Addendum)
Inpatient Diabetes Program Recommendations  AACE/ADA: New Consensus Statement on Inpatient Glycemic Control (2015)  Target Ranges:  Prepandial:   less than 140 mg/dL      Peak postprandial:   less than 180 mg/dL (1-2 hours)      Critically ill patients:  140 - 180 mg/dL   Lab Results  Component Value Date   GLUCAP 237 (H) 04/03/2020   HGBA1C 8.9 (H) 03/24/2020    Review of Glycemic Control Results for DARENE, NAPPI (MRN [id number]) as of 04/03/2020 10:48  Ref. Range 04/02/2020 07:42 04/02/2020 11:30 04/02/2020 14:33 04/02/2020 15:58 04/02/2020 19:23 04/02/2020 23:59 04/03/2020 03:23 04/03/2020 08:15  Glucose-Capillary Latest Ref Range: 70 - 99 mg/dL 400 (H) 867 (H) 619 (H) 319 (H) 308 (H) 242 (H) 218 (H) 237 (H)    Current diabetes medications: Novolog 0-20 units q4h  Novolog 4 units q4h tube feed coverage Levemir 15 units bid  Vital 1.5 @60ml /hr  Inpatient Diabetes Program Recommendations:     Novolog 8 units qh4 tube feed coverage-stop if tube feeds held or discontinued  Will continue to follow while inpatient. Spoke with , NP Tube feeds currently on hold. Agree with Lantus decrease and would recommend Novolog correction decrease to 0-15 units Q4.  Thank you, Annie Main RN, MSN, BC-ADM Inpatient Diabetes Coordinator Team Pager [phone] (8a-5p)

## 2020-04-03 NOTE — Progress Notes (Signed)
STROKE TEAM PROGRESS NOTE   INTERVAL HISTORY Her husband is at the bedside.  Patient remains interactive.  Neurological exam unchanged.  Awaiting rehab transfer.  CBGs remain elevated.  No family at bedside today. Vitals:   04/03/20 0334 04/03/20 0500 04/03/20 1000 04/03/20 1200  BP: (!) 146/95  (!) 162/98 134/77  Pulse: 95  87   Resp: 19  18 18   Temp: 97.7 F (36.5 C)  98 F (36.7 C)   TempSrc: Oral  Axillary   SpO2: 96%  97%   Weight:  77.9 kg     CBC:  Recent Labs  Lab 03/29/20 0437 04/01/20 0554  WBC 11.1* 15.6*  HGB 11.6* 12.4  HCT 35.6* 38.2  MCV 89.0 89.9  PLT 305 450*   Basic Metabolic Panel:  Recent Labs  Lab 03/28/20 0408 03/28/20 0408 03/29/20 0437 03/29/20 0437 03/30/20 1111 03/31/20 0454 04/01/20 0554  NA 140   < > 137  --   --   --  143  K 4.4   < > 4.3  --   --   --  4.6  CL 108   < > 104  --   --   --  107  CO2 22   < > 22  --   --   --  24  GLUCOSE 293*   < > 387*  --   --   --  207*  BUN 22   < > 30*  --   --   --  47*  CREATININE 1.12*   < > 1.15*  --   --   --  1.51*  CALCIUM 8.4*   < > 8.6*  --   --   --  9.4  MG 1.8  --   --   --   --  2.0  --   PHOS 2.3*   < > 2.4*   < > 1.5* 4.6  --    < > = values in this interval not displayed.    IMAGING past 24 hours No results found.  PHYSICAL EXAM      General - Well nourished, well developed, drowsy sleepy.   04/03/20  Ophthalmologic - fundi not visualized due to noncooperation.  Cardiovascular - Regular rhythm and rate.  Neuro - patient is awake.  She speaks short sentences and follows simple midline commands.. Eyes right gaze preference, not crossing midline. PERRL. Not blinking to visual threat bilaterally. Left facial droop. Tongue protrusion not cooperative. LUE flaccid, mild withdraw to pain. LLE 1/5 withdraw on pain stimulation. RUE spontaneous against gravity and purposeful. RLE 3/5 on pain stimulation.  She is noted to have frequent flexion movement of the right upper extremity and right  leg.  It seems semirhythmic and most consistent with a periodic limb movements.  Sensation, coordination and gait not tested   ASSESSMENT/PLAN Ms. RITA VIALPANDO is a 74 y.o. female with history of HTN, HLD, DB presenting with slurred speech, L sided weakness and ataxia. Received IV tPA 03/23/2020 at 2252. Worsening in ED. CTA confirmed distal R M1 occlusion. Taken to IR.    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 ASPECTS 10.     CTA head & neck distal R M2 occlusion w/ moderate collateral flow R MCA territory.   Cerebral angio angio w/ TICI3 revascularization R M1 occlusion  Post IR CT no ICH or mass effect   CT head 6/14 0234 no acute abnormality. Small vessel disease.   CT head 6/14 1232 R MCA territory infarct sparing basal ganglia  CT head 6/14 2159 progressive R MCA territory infarct w/ basal ganglia sparing. Hyperdense R M2  MRI  Large R MCA territory infarct w/ petechial hemorrhage. Scattered small R PCA territory infarcts. Punctate poster L periventricular WM infarct. Old B basal ganglia and thalamic lacunes.   MRA  Reocclusion distal R M1  EEG R frontal sharp waves, generalized and lateralized R brain slowing  LE Doppler  No DVT  2D Echo EF 70-75%. No obvious source of embolus. ?Echodensity L side of MV  TEE normal MV, EF 70-75%, no PFO  Plan loop recorder before d/c to CIR  TG>500->247, Direct LDL 63.8  HgbA1c 8.9  Heparin 5000 units sq tid for VTE prophylaxis  aspirin 81 mg daily prior to admission, now on ASA 325 & Plavix 75 DAPT for 3 months and then plavix alone given right MCA reocclusion.   Therapy recommendations:  CIR vs SNF  Disposition:  pending   Acute Respiratory Failure, resolved  Intubated for IR, left intubated d/t slow response to reversal and involuntary flapping of RUE   Extubated 6/14  followed by coffee ground emesis   Bronchial hygeine  Was on nasal trumpet -> now off  Encephalopathy Emesis post extubation  Fever, Elevated Lactate  Coffee ground emesis s/p extubation  TMax  afebrile  Hemodynamically stable  KUB w/o ileus, + hydroureteronephrosis   UA w/ WBC > 50 & UCx E coli  BCx no growth   Trop 28->45   Lactic acid normal  Cefepime 6/14>>6/15  Meropenem 6/15>>6/17  keflex 6/17>>6/21  Hypertension  Home meds:  amlodipine 5, lisinopril 20, metoprolol 100 bid  . On Cleviprex -> off  . SBP goal now < 180 . On amlodipine 10 . Long-term BP goal 130-150 given right MCA reocclusion (SBP 131 - 189 today)  Hyperlipidemia  Home meds:  pravachol 40  TG>500->247, Direct LDL 63.8, goal < 70  Resumed pravastatin 40  Add zetia 10  Continue statin and zetia at discharge  Diabetes type II Uncontrolled  Home meds:  trulicity 8.41, glimepiride 1, metformin 500 bid, ozempic 0.25-0.5  HgbA1c 8.9, goal < 7.0  On TF  CBGs  SSI  Diabetic coordinator following  increase Levemir to 15u Bid and 4u q4h on TF  Close PCP follow up for better DM control  Dysphagia . Secondary to stroke . NPO . Cortrak placed . TF @ 55 and NS @ 20 . Speech on board   Lethargy   Fluctuate MS prior  Continues to have increased lethargy  Increased amantadine to 150 bid 6/21 - altered times to 0600 & 1400 -  Suspect the patient likely has sleep apnea.  AutoPap will be used at nighttime. (pt refused)  More awake today  Other Stroke Risk Factors  Advanced age  Family hx stroke (father)  Other Active Problems  Leukocytosis, WBC 11.1->15.6   AKI on CRF, Cre 1.51 renal US +B cysts, no obstruction. Plans for OP nephrology PTA.   Hypokalemia 3.4 - supplement - 4.6 - resolved  Hypophosphatemia - 2.3->2.4->1.5   Mild anemia - 11.2->11.6->12.4  Suspected severe case of restless leg syndrome.  started on Requip twice a day. ferritin level  normal  Hospital day # 10  Continue present management..  Continue amantadine for arousal and Requip for restless legs.  Await  transfer to rehab when bed available continue ongoing therapies   Delia Heady, MD To contact Stroke Continuity provider, please refer to [email or web address]. After hours, contact General Neurology

## 2020-04-04 LAB — GLUCOSE, CAPILLARY
Glucose-Capillary: 186 mg/dL — ABNORMAL HIGH (ref 70–99)
Glucose-Capillary: 198 mg/dL — ABNORMAL HIGH (ref 70–99)
Glucose-Capillary: 213 mg/dL — ABNORMAL HIGH (ref 70–99)
Glucose-Capillary: 223 mg/dL — ABNORMAL HIGH (ref 70–99)
Glucose-Capillary: 228 mg/dL — ABNORMAL HIGH (ref 70–99)
Glucose-Capillary: 239 mg/dL — ABNORMAL HIGH (ref 70–99)
Glucose-Capillary: 243 mg/dL — ABNORMAL HIGH (ref 70–99)

## 2020-04-04 MED ORDER — SODIUM CHLORIDE 0.9 % IV SOLN: INTRAVENOUS | Status: DC

## 2020-04-04 MED ORDER — RESOURCE THICKENUP CLEAR PO POWD
ORAL | Status: DC | PRN
  Filled 2020-04-04: qty 125

## 2020-04-04 NOTE — Progress Notes (Signed)
STROKE TEAM PROGRESS NOTE   INTERVAL HISTORY No changes  Patient remains interactive.  Neurological exam unchanged.  Awaiting rehab transfer.  CBGs remain elevated.  No family at bedside today. Vitals:   04/04/20 0018 04/04/20 0327 04/04/20 0825 04/04/20 1205  BP: 137/85 (!) 152/99 115/62 102/63  Pulse: 82 85 85 81  Resp: 16 16 18 16   Temp: 97.6 F (36.4 C) 97.6 F (36.4 C) 97.8 F (36.6 C) 97.7 F (36.5 C)  TempSrc: Oral  Oral Oral  SpO2: 95% 96% 95% 100%  Weight:       CBC:  Recent Labs  Lab 03/29/20 0437 04/01/20 0554  WBC 11.1* 15.6*  HGB 11.6* 12.4  HCT 35.6* 38.2  MCV 89.0 89.9  PLT 305 450*   Basic Metabolic Panel:  Recent Labs  Lab 03/29/20 0437 03/29/20 0437 03/30/20 1111 03/31/20 0454 04/01/20 0554  NA 137  --   --   --  143  K 4.3  --   --   --  4.6  CL 104  --   --   --  107  CO2 22  --   --   --  24  GLUCOSE 387*  --   --   --  207*  BUN 30*  --   --   --  47*  CREATININE 1.15*  --   --   --  1.51*  CALCIUM 8.6*  --   --   --  9.4  MG  --   --   --  2.0  --   PHOS 2.4*   < > 1.5* 4.6  --    < > = values in this interval not displayed.    IMAGING past 24 hours No results found.  PHYSICAL EXAM      General - Well nourished, well developed, drowsy sleepy.   04/03/20  Ophthalmologic - fundi not visualized due to noncooperation.  Cardiovascular - Regular rhythm and rate.  Neuro - patient is awake.  She speaks short sentences and follows simple midline commands.. Eyes right gaze preference, not crossing midline. PERRL. Not blinking to visual threat bilaterally. Left facial droop. Tongue protrusion not cooperative. LUE flaccid, mild withdraw to pain. LLE 1/5 withdraw on pain stimulation. RUE spontaneous against gravity and purposeful. RLE 3/5 on pain stimulation.  She is noted to have frequent flexion movement of the right upper extremity and right leg.  It seems semirhythmic and most consistent with a periodic limb movements.  Sensation, coordination and  gait not tested   ASSESSMENT/PLAN [name] is a 74 y.o. female with history of HTN, HLD, DB presenting with slurred speech, L sided weakness and ataxia. Received IV tPA 03/23/2020 at 2252. Worsening in ED. CTA confirmed distal R M1 occlusion. Taken to IR.    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 ASPECTS 10.     CTA head & neck distal R M2 occlusion w/ moderate collateral flow R MCA territory.   Cerebral angio angio w/ TICI3 revascularization R M1 occlusion   Post IR CT no ICH or mass effect   CT head 6/14 0234 no acute abnormality. Small vessel disease.   CT head 6/14 1232 R MCA territory infarct sparing basal ganglia  CT head 6/14 2159 progressive R MCA territory infarct w/ basal ganglia sparing. Hyperdense R M2  MRI  Large R MCA territory infarct w/ petechial hemorrhage. Scattered small R PCA territory infarcts. Punctate poster L periventricular WM infarct. Old B basal ganglia and thalamic lacunes.   MRA  Reocclusion distal R M1  EEG R frontal sharp waves, generalized and lateralized R brain slowing  LE Doppler  No DVT  2D Echo EF 70-75%. No obvious source of embolus. ?Echodensity L side of MV  TEE normal MV, EF 70-75%, no PFO  Plan loop recorder before d/c to CIR  TG>500->247, Direct LDL 63.8  HgbA1c 8.9  Heparin 5000 units sq tid for VTE prophylaxis  aspirin 81 mg daily prior to admission, now on ASA 325 & Plavix 75 DAPT for 3 months and then plavix alone given right MCA reocclusion.   Therapy recommendations:  CIR vs SNF  Disposition:  pending   Acute Respiratory Failure, resolved  Intubated for IR, left intubated d/t slow response to reversal and involuntary flapping of RUE   Extubated 6/14 followed by coffee ground emesis   Bronchial hygeine  Was on nasal trumpet -> now  off  Encephalopathy Emesis post extubation  Fever, Elevated Lactate  Coffee ground emesis s/p extubation  TMax  afebrile  Hemodynamically stable  KUB w/o ileus, + hydroureteronephrosis   UA w/ WBC > 50 & UCx E coli  BCx no growth   Trop 28->45   Lactic acid normal  Cefepime 6/14>>6/15  Meropenem 6/15>>6/17  keflex 6/17>>6/21  Hypertension  Home meds:  amlodipine 5, lisinopril 20, metoprolol 100 bid  . On Cleviprex -> off  . SBP goal now < 180 . On amlodipine 10 . Long-term BP goal 130-150 given right MCA reocclusion (SBP 131 - 189 today)  Hyperlipidemia  Home meds:  pravachol 40  TG>500->247, Direct LDL 63.8, goal < 70  Resumed pravastatin 40  Add zetia 10  Continue statin and zetia at discharge  Diabetes type II Uncontrolled  Home meds:  trulicity 4.28, glimepiride 1, metformin 500 bid, ozempic 0.25-0.5  HgbA1c 8.9, goal < 7.0  On TF  CBGs  SSI  Diabetic coordinator following  increase Levemir to 15u Bid and 4u q4h on TF  Close PCP follow up for better DM control  Dysphagia . Secondary to stroke . NPO . Cortrak placed . TF @ 55 and NS @ 20 . Speech on board   Lethargy   Fluctuate MS prior  Continues to have increased lethargy  Increased amantadine to 150 bid 6/21 - altered times to 0600 & 1400 -  Suspect the patient likely has sleep apnea.  AutoPap will be used at nighttime. (pt refused)  More awake today  Other Stroke Risk Factors  Advanced age  Family hx stroke (father)  Other Active Problems  Leukocytosis, WBC 11.1->15.6   AKI on CRF, Cre 1.51 renal US +B cysts, no obstruction. Plans for OP nephrology PTA.   Hypokalemia 3.4 - supplement - 4.6 - resolved  Hypophosphatemia - 2.3->2.4->1.5   Mild anemia - 11.2->11.6->12.4  Suspected severe case of restless leg syndrome.  started on Requip twice a day. ferritin level normal  Hospital day # 11  Continue present management..  continue amantadine for arousal  And  discontinue Requip for restless legs.  Await transfer to rehab when bed available continue ongoing therapies   Antony Contras, MD To contact Stroke Continuity provider, please refer to [email or web address] After hours, contact General Neurology

## 2020-04-04 NOTE — Progress Notes (Signed)
  Speech Language Pathology Treatment: Dysphagia  Patient Details [name] MRN: [id number] DOB: 1946-03-03 Today's Date: 04/04/2020 Time: 5732-2025 SLP Time Calculation (min) (ACUTE ONLY): 11 min  Assessment / Plan / Recommendation Clinical Impression  Pt was seen for treatment and was cooperative throughout the session. Meredith Rieger, RN and the nurse tech reported that the pt has been tolerating the current diet without overt s/sx of aspiration and that she ate approximately half of lunch. Pt consumed puree solids and honey thick liquids via straw without overt s/x of aspiration. Lingual residue was noted with puree and was cleared with a liquid wash, but no pocketing was demonstrated. Repetition was frequently required for clarification due to dysarthria. However, cognitive-linguistic and dysarthria treatment was deferred since pt reported that she was tired and would like to rest. SLP will continue to follow pt.    HPI HPI: 74 y.o female admitted with sudden onset Lt sided weakness and slurred speech.  Tpa was administered.  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 Pt extubated 6/14, but had increasing lethargy > possibly septic due to UTI.  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 MBS 6/18 showed intermittent aspiration across consistencies; NPO was recommended and cortrak was placed.  Repeat MBS 6/23: persisting dysphagia marked by oral holding, decreased initiation, and anterior left spillage and residue.  There was continued silent, trace aspiration of nectar thick liquids before the swallow; honey thick liquids were not aspirated. There was good pharyngeal clearance/no residue post-swallow. A puree diet with honey thick liquids was recommended at that time.       SLP Plan  Continue with current plan of care       Recommendations  Diet recommendations: Dysphagia 1  (puree);Honey-thick liquid Liquids provided via: Cup;Straw Medication Administration: Crushed with puree Supervision: Staff to assist with self feeding;Full supervision/cueing for compensatory strategies Compensations: Slow rate;Small sips/bites;Lingual sweep for clearance of pocketing Postural Changes and/or Swallow Maneuvers: Seated upright 90 degrees                Oral Care Recommendations: Oral care prior to ice chip/H20;Oral care QID Follow up Recommendations: Inpatient Rehab SLP Visit Diagnosis: Dysphagia, oropharyngeal phase (R13.12) Plan: Continue with current plan of care       Meredith Shaw I. Vear Clock, MS, CCC-SLP Acute Rehabilitation Services Office number [phone] Pager [phone]                Meredith Shaw 04/04/2020, 5:32 PM

## 2020-04-04 NOTE — Progress Notes (Signed)
Inpatient Diabetes Program Recommendations  AACE/ADA: New Consensus Statement on Inpatient Glycemic Control (2015)  Target Ranges:  Prepandial:   less than 140 mg/dL      Peak postprandial:   less than 180 mg/dL (1-2 hours)      Critically ill patients:  140 - 180 mg/dL   Lab Results  Component Value Date   GLUCAP 243 (H) 04/04/2020   HGBA1C 8.9 (H) 03/24/2020    Review of Glycemic Control Results for Meredith Shaw, Meredith Shaw (MRN [id number]) as of 04/04/2020 10:02  Ref. Range 04/03/2020 08:15 04/03/2020 12:04 04/03/2020 15:52 04/03/2020 20:04 04/04/2020 00:27 04/04/2020 03:28 04/04/2020 07:50  Glucose-Capillary Latest Ref Range: 70 - 99 mg/dL 511 (H) 021 (H) 117 (H) 162 (H) 186 (H) 198 (H) 243 (H)    Current diabetes medications: Novolog 0-20 units q4h  Novolog 4 units q4h tube feed coverage Levemir 10 units bid  Diet ordered  Ensure max tid  Inpatient Diabetes Program Recommendations:     - Increase Levemir back up to 15 units bid  Thank you, Christena Deem RN, MSN, BC-ADM Inpatient Diabetes Coordinator Team Pager [phone] (8a-5p)

## 2020-04-04 NOTE — Progress Notes (Signed)
Brief Nutrition Note  Calorie count ordered yesterday (6/24). RD went to floor today to go through initial results but there was no calorie count envelope on the door. Discussed with RN. Per RN, staff was unaware of calorie count and will begin today. RD will follow-up with results on Monday. Of note, RD will discontinue Ensure Max as I am unsure if it is being thickened to appropriate consistency. Pt will continue with Magic Cup.   Would not recommend pulling Cortrak until patient can consistently meet >75% estimated needs via po intake.   Eugene Gavia, MS, RD, LDN RD pager number and weekend/on-call pager number located in [place].

## 2020-04-05 ENCOUNTER — Inpatient Hospital Stay (HOSPITAL_COMMUNITY): Payer: Medicare Other

## 2020-04-05 DIAGNOSIS — A499 Bacterial infection, unspecified: Secondary | ICD-10-CM

## 2020-04-05 DIAGNOSIS — R1312 Dysphagia, oropharyngeal phase: Secondary | ICD-10-CM

## 2020-04-05 LAB — HEPATIC FUNCTION PANEL
ALT: 72 U/L — ABNORMAL HIGH (ref 0–44)
AST: 44 U/L — ABNORMAL HIGH (ref 15–41)
Albumin: 2.9 g/dL — ABNORMAL LOW (ref 3.5–5.0)
Alkaline Phosphatase: 76 U/L (ref 38–126)
Bilirubin, Direct: 0.1 mg/dL (ref 0.0–0.2)
Indirect Bilirubin: 0.8 mg/dL (ref 0.3–0.9)
Total Bilirubin: 0.9 mg/dL (ref 0.3–1.2)
Total Protein: 6.3 g/dL — ABNORMAL LOW (ref 6.5–8.1)

## 2020-04-05 LAB — GLUCOSE, CAPILLARY
Glucose-Capillary: 125 mg/dL — ABNORMAL HIGH (ref 70–99)
Glucose-Capillary: 138 mg/dL — ABNORMAL HIGH (ref 70–99)
Glucose-Capillary: 172 mg/dL — ABNORMAL HIGH (ref 70–99)
Glucose-Capillary: 215 mg/dL — ABNORMAL HIGH (ref 70–99)
Glucose-Capillary: 229 mg/dL — ABNORMAL HIGH (ref 70–99)
Glucose-Capillary: 243 mg/dL — ABNORMAL HIGH (ref 70–99)
Glucose-Capillary: 81 mg/dL (ref 70–99)

## 2020-04-05 LAB — BASIC METABOLIC PANEL
Anion gap: 13 (ref 5–15)
BUN: 53 mg/dL — ABNORMAL HIGH (ref 8–23)
CO2: 20 mmol/L — ABNORMAL LOW (ref 22–32)
Calcium: 9 mg/dL (ref 8.9–10.3)
Chloride: 102 mmol/L (ref 98–111)
Creatinine, Ser: 1.66 mg/dL — ABNORMAL HIGH (ref 0.44–1.00)
GFR calc Af Amer: 35 mL/min — ABNORMAL LOW (ref >60)
GFR calc non Af Amer: 30 mL/min — ABNORMAL LOW (ref >60)
Glucose, Bld: 151 mg/dL — ABNORMAL HIGH (ref 70–99)
Potassium: 4 mmol/L (ref 3.5–5.1)
Sodium: 135 mmol/L (ref 135–145)

## 2020-04-05 LAB — CBC
HCT: 36 % (ref 36.0–46.0)
Hemoglobin: 11.7 g/dL — ABNORMAL LOW (ref 12.0–15.0)
MCH: 29.3 pg (ref 26.0–34.0)
MCHC: 32.5 g/dL (ref 30.0–36.0)
MCV: 90 fL (ref 80.0–100.0)
Platelets: 487 10*3/uL — ABNORMAL HIGH (ref 150–400)
RBC: 4 MIL/uL (ref 3.87–5.11)
RDW: 13.7 % (ref 11.5–15.5)
WBC: 23.2 10*3/uL — ABNORMAL HIGH (ref 4.0–10.5)
nRBC: 0 % (ref 0.0–0.2)

## 2020-04-05 LAB — URINALYSIS, COMPLETE (UACMP) WITH MICROSCOPIC
Bilirubin Urine: NEGATIVE
Glucose, UA: NEGATIVE mg/dL
Ketones, ur: NEGATIVE mg/dL
Nitrite: NEGATIVE
Protein, ur: 100 mg/dL — AB
RBC / HPF: >50 RBC/hpf — ABNORMAL HIGH (ref 0–5)
Specific Gravity, Urine: 1.013 (ref 1.005–1.030)
WBC, UA: >50 WBC/hpf — ABNORMAL HIGH (ref 0–5)
pH: 5 (ref 5.0–8.0)

## 2020-04-05 MED ORDER — SENNOSIDES-DOCUSATE SODIUM 8.6-50 MG PO TABS: 1.0000 | ORAL_TABLET | Freq: Every evening | ORAL | Status: DC | PRN

## 2020-04-05 MED ORDER — EZETIMIBE 10 MG PO TABS
10.0000 mg | ORAL_TABLET | Freq: Every day | ORAL | Status: DC
  Administered 2020-04-06 – 2020-04-11 (×6): 10 mg via ORAL
  Filled 2020-04-05 (×6): qty 1

## 2020-04-05 MED ORDER — CLOPIDOGREL BISULFATE 75 MG PO TABS
75.0000 mg | ORAL_TABLET | Freq: Every day | ORAL | Status: DC
  Administered 2020-04-06: 75 mg via ORAL
  Filled 2020-04-05: qty 1

## 2020-04-05 MED ORDER — PANTOPRAZOLE SODIUM 40 MG PO TBEC
40.0000 mg | DELAYED_RELEASE_TABLET | Freq: Every day | ORAL | Status: DC
  Administered 2020-04-05 – 2020-04-11 (×7): 40 mg via ORAL
  Filled 2020-04-05 (×8): qty 1

## 2020-04-05 MED ORDER — AMANTADINE HCL 50 MG/5ML PO SYRP: 150.0000 mg | ORAL_SOLUTION | ORAL | Status: DC

## 2020-04-05 MED ORDER — AMANTADINE HCL 50 MG/5ML PO SYRP
150.0000 mg | ORAL_SOLUTION | Freq: Every day | ORAL | Status: DC
  Administered 2020-04-06 – 2020-04-11 (×6): 150 mg via ORAL
  Filled 2020-04-05 (×8): qty 15

## 2020-04-05 MED ORDER — AMLODIPINE BESYLATE 10 MG PO TABS
10.0000 mg | ORAL_TABLET | Freq: Every day | ORAL | Status: DC
  Administered 2020-04-06 – 2020-04-11 (×6): 10 mg via ORAL
  Filled 2020-04-05 (×6): qty 1

## 2020-04-05 MED ORDER — PRAVASTATIN SODIUM 40 MG PO TABS: 40.0000 mg | ORAL_TABLET | Freq: Every day | ORAL | Status: DC

## 2020-04-05 MED ORDER — ASPIRIN 325 MG PO TABS
325.0000 mg | ORAL_TABLET | Freq: Every day | ORAL | Status: DC
  Administered 2020-04-06: 325 mg via ORAL
  Filled 2020-04-05: qty 1

## 2020-04-05 MED ORDER — BISACODYL 5 MG PO TBEC
5.0000 mg | DELAYED_RELEASE_TABLET | Freq: Every day | ORAL | Status: DC
  Administered 2020-04-05 – 2020-04-11 (×6): 5 mg via ORAL
  Filled 2020-04-05 (×6): qty 1

## 2020-04-05 MED ORDER — SODIUM CHLORIDE 0.9 % IV SOLN
1.0000 g | INTRAVENOUS | Status: DC
  Administered 2020-04-05 – 2020-04-07 (×3): 1 g via INTRAVENOUS
  Filled 2020-04-05 (×3): qty 10

## 2020-04-05 NOTE — Plan of Care (Signed)

## 2020-04-05 NOTE — Progress Notes (Signed)
STROKE TEAM PROGRESS NOTE   INTERVAL HISTORY No family at bedside. Pt lying in bed, eyes open, awake alert, left neglect, right gaze, following commands on the right. No acute neuro changes Awaiting rehab transfer. Still has elevated Cre and WBC. Increase IVF and encourage po intake. Check UA and CXR.   Vitals:   04/05/20 0435 04/05/20 0440 04/05/20 0842 04/05/20 1225  BP: (!) 179/84 (!) 147/93 126/83 122/88  Pulse: 89 89 79 91  Resp: 17 17 16 18   Temp: 98.6 F (37 C) 98.6 F (37 C) 98 F (36.7 C) 98.4 F (36.9 C)  TempSrc: Oral Oral Oral Oral  SpO2: 96% 97% 97% 98%  Weight:       CBC:  Recent Labs  Lab 04/01/20 0554 04/05/20 0355  WBC 15.6* 23.2*  HGB 12.4 11.7*  HCT 38.2 36.0  MCV 89.9 90.0  PLT 450* 487*   Basic Metabolic Panel:  Recent Labs  Lab 03/30/20 1111 03/31/20 0454 04/01/20 0554 04/05/20 0355  NA  --   --  143 135  K  --   --  4.6 4.0  CL  --   --  107 102  CO2  --   --  24 20*  GLUCOSE  --   --  207* 151*  BUN  --   --  47* 53*  CREATININE  --   --  1.51* 1.66*  CALCIUM  --   --  9.4 9.0  MG  --  2.0  --   --   PHOS 1.5* 4.6  --   --     IMAGING past 24 hours DG CHEST PORT 1 VIEW  Result Date: 04/05/2020 CLINICAL DATA:  Leukocytosis.  History of hypertension and diabetes. EXAM: PORTABLE CHEST 1 VIEW COMPARISON:  03/26/2020 FINDINGS: Cardiac silhouette is normal in size. No mediastinal or hilar masses. Clear lungs.  No convincing pleural effusion and no pneumothorax. Enteric feeding tube passes through the stomach and below the included field of view. Skeletal structures are grossly intact. IMPRESSION: No acute cardiopulmonary disease. Electronically Signed   By: 03/28/2020 M.D.   On: 04/05/2020 13:34    PHYSICAL EXAM    Temp:  [97.9 F (36.6 C)-98.6 F (37 C)] 98.4 F (36.9 C) (06/26 1225) Pulse Rate:  [79-91] 91 (06/26 1225) Resp:  [15-18] 18 (06/26 1225) BP: (122-179)/(83-114) 122/88 (06/26 1225) SpO2:  [96 %-100 %] 98 % (06/26  1225)  General - Well nourished, well developed, awake alert, eyes open.  Ophthalmologic - fundi not visualized due to noncooperation.  Cardiovascular - Regular rhythm and rate.  Neuro - patient is awake alert, eyes open, orientated to place, age but not to time. Paucity of speech but follows simple midline and peripheral commands. Eyes right gaze preference, not crossing midline. PERRL. Not blinking to visual threat on the left. Left facial droop. Tongue protrusion midline. LUE mild withdraw to pain. LLE 1/5 withdraw on pain stimulation. RUE spontaneous against gravity and purposeful. RLE 3/5 on pain stimulation. Sensation, coordination not cooperative and gait not tested   ASSESSMENT/PLAN Ms. ERIA LOZOYA is a 74 y.o. female with history of HTN, HLD, DB presenting with slurred speech, L sided weakness and ataxia. Received IV tPA 03/23/2020 at 2252. Worsening in ED. CTA confirmed distal R M1 occlusion. Taken to IR.    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 ASPECTS 10.     CTA head & neck distal R M2 occlusion w/ moderate collateral flow R MCA territory.   Cerebral angio angio w/ TICI3 revascularization R M1 occlusion   Post IR CT no ICH or mass effect   CT head 6/14 0234 no acute abnormality. Small vessel disease.   CT head 6/14 1232 R MCA territory infarct sparing basal ganglia  CT head 6/14 2159 progressive R MCA territory infarct w/ basal ganglia sparing. Hyperdense R M2  MRI  Large R MCA territory infarct w/ petechial hemorrhage. Scattered small R PCA territory infarcts. Punctate poster L periventricular WM infarct. Old B basal ganglia and thalamic lacunes.   MRA  Reocclusion distal R M1  EEG R frontal sharp waves, generalized and lateralized R brain slowing  LE Doppler  No DVT  2D Echo EF 70-75%. No obvious source of  embolus. ?Echodensity L side of MV  TEE normal MV, EF 70-75%, no PFO  Plan loop recorder before d/c to CIR  TG >500->247, Direct LDL 63.8  HgbA1c 8.9  Heparin 5000 units sq tid for VTE prophylaxis  aspirin 81 mg daily prior to admission, now on ASA 325 & Plavix 75 DAPT for 3 months and then plavix alone given right MCA reocclusion.   Therapy recommendations:  CIR   Disposition:  pending   Acute Respiratory Failure, resolved  Intubated for IR, left intubated d/t slow response to reversal and involuntary flapping of RUE   Extubated 6/14 followed by coffee ground emesis   Bronchial hygeine  Was on nasal trumpet -> now off  Tolerating extubation well now  On room air  Leukocytosis  KUB w/o ileus, + hydroureteronephrosis -> outpt urology follow up  6/14 UA w/ WBC > 50 & UCx E coli  BCx no growth   Cefepime 6/14>>6/15  Meropenem 6/15>>6/17  keflex 6/17>>6/21  Leukocytosis, WBC 11.1->15.6->23.2 (afebrile)   6/26 U/A w/ WBC > 50   Urine culture pending   restart Rocephin   AKI  Creatinine 1.12-1.51-1.66  On IV fluid 75 cc/h  Encourage p.o. intake  BMP monitoring  Hypertension  Home meds:  amlodipine 5, lisinopril 20, metoprolol 100 bid  . On Cleviprex -> off  . BP stable  . On amlodipine 10, lisinopril 20 . Long-term BP goal 130-150 given right MCA reocclusion  Hyperlipidemia  Home meds:  pravachol 40  TG >500->247, Direct LDL 63.8, goal < 70  Resumed pravastatin 40  Add zetia 10  Continue statin and zetia at discharge  Diabetes type II Uncontrolled  Home meds:  trulicity 0.75, glimepiride 1, metformin 500 bid, ozempic 0.25-0.5  HgbA1c 8.9, goal < 7.0  CBGs  SSI  Diabetic coordinator following  increase Levemir to 15u Bid and 4u q4h on TF  Close PCP follow up for better DM control  Dysphagia . Secondary to stroke . Now on Dys 1 and honey thick -> eating well so far . Cortrak placed -> removed 6/26 . FW 100Q4  . On IVF   . Speech on board   Other Stroke Risk Factors  Advanced age  Family hx stroke (father)  Other Active Problems  Hypokalemia 3.4 - supplement - 4.6->4.0 resolved  Lethargy - on amantadine  Hospital day # 47   Marvel Plan, MD PhD Stroke Neurology 04/05/2020 4:49 PM  Patient condition worsened within the last 24 hours, has developed AKI, leukocytosis, UTI, continues to have elevated creatinine and WBC as well as hyperglycemia, and I ordered IV  fluid, put on antibiotics, and ordered urine culture.  I also removed a cortrck. I spent  35 minutes in total face-to-face time with the patient, more than 50% of which was spent in counseling and coordination of care, reviewing test results, images and medication, and discussing the diagnosis, treatment plan and potential prognosis. This patient's care requiresreview of multiple databases, neurological assessment, discussion with family, other specialists and medical decision making of high complexity.   To contact Stroke Continuity provider, please refer to [email or web address] After hours, contact General Neurology

## 2020-04-06 DIAGNOSIS — R7989 Other specified abnormal findings of blood chemistry: Secondary | ICD-10-CM

## 2020-04-06 LAB — BASIC METABOLIC PANEL WITH GFR
Anion gap: 11 (ref 5–15)
BUN: 45 mg/dL — ABNORMAL HIGH (ref 8–23)
CO2: 21 mmol/L — ABNORMAL LOW (ref 22–32)
Calcium: 8.7 mg/dL — ABNORMAL LOW (ref 8.9–10.3)
Chloride: 107 mmol/L (ref 98–111)
Creatinine, Ser: 1.71 mg/dL — ABNORMAL HIGH (ref 0.44–1.00)
GFR calc Af Amer: 34 mL/min — ABNORMAL LOW
GFR calc non Af Amer: 29 mL/min — ABNORMAL LOW
Glucose, Bld: 185 mg/dL — ABNORMAL HIGH (ref 70–99)
Potassium: 4.3 mmol/L (ref 3.5–5.1)
Sodium: 139 mmol/L (ref 135–145)

## 2020-04-06 LAB — GLUCOSE, CAPILLARY
Glucose-Capillary: 168 mg/dL — ABNORMAL HIGH (ref 70–99)
Glucose-Capillary: 164 mg/dL — ABNORMAL HIGH (ref 70–99)
Glucose-Capillary: 183 mg/dL — ABNORMAL HIGH (ref 70–99)
Glucose-Capillary: 173 mg/dL — ABNORMAL HIGH (ref 70–99)
Glucose-Capillary: 156 mg/dL — ABNORMAL HIGH (ref 70–99)

## 2020-04-06 LAB — CBC
HCT: 34.2 % — ABNORMAL LOW (ref 36.0–46.0)
Hemoglobin: 11 g/dL — ABNORMAL LOW (ref 12.0–15.0)
MCH: 29.5 pg (ref 26.0–34.0)
MCHC: 32.2 g/dL (ref 30.0–36.0)
MCV: 91.7 fL (ref 80.0–100.0)
Platelets: 434 K/uL — ABNORMAL HIGH (ref 150–400)
RBC: 3.73 MIL/uL — ABNORMAL LOW (ref 3.87–5.11)
RDW: 14.1 % (ref 11.5–15.5)
WBC: 18.3 K/uL — ABNORMAL HIGH (ref 4.0–10.5)
nRBC: 0 % (ref 0.0–0.2)

## 2020-04-06 MED ORDER — ASPIRIN EC 81 MG PO TBEC
81.0000 mg | DELAYED_RELEASE_TABLET | Freq: Every day | ORAL | Status: DC
  Administered 2020-04-07 – 2020-04-11 (×5): 81 mg via ORAL
  Filled 2020-04-06 (×5): qty 1

## 2020-04-06 MED ORDER — LISINOPRIL 20 MG PO TABS
20.0000 mg | ORAL_TABLET | Freq: Every day | ORAL | Status: DC
  Administered 2020-04-06: 20 mg via ORAL

## 2020-04-06 MED ORDER — FREE WATER: 100.0000 mL | Status: DC

## 2020-04-06 MED ORDER — PRO-STAT SUGAR FREE PO LIQD
30.0000 mL | Freq: Every day | ORAL | Status: DC
  Administered 2020-04-06 – 2020-04-07 (×2): 30 mL via ORAL
  Filled 2020-04-06: qty 30

## 2020-04-06 MED ORDER — INSULIN ASPART 100 UNIT/ML ~~LOC~~ SOLN
0.0000 [IU] | Freq: Three times a day (TID) | SUBCUTANEOUS | Status: DC
  Administered 2020-04-06 – 2020-04-07 (×3): 4 [IU] via SUBCUTANEOUS
  Administered 2020-04-07: 3 [IU] via SUBCUTANEOUS

## 2020-04-06 MED ORDER — POLYETHYLENE GLYCOL 3350 17 G PO PACK
17.0000 g | PACK | Freq: Every day | ORAL | Status: DC
  Administered 2020-04-06 – 2020-04-11 (×5): 17 g via ORAL
  Filled 2020-04-06 (×4): qty 1

## 2020-04-06 MED ORDER — METOPROLOL TARTRATE 25 MG PO TABS
25.0000 mg | ORAL_TABLET | Freq: Two times a day (BID) | ORAL | Status: DC
  Administered 2020-04-06 – 2020-04-11 (×11): 25 mg via ORAL
  Filled 2020-04-06 (×11): qty 1

## 2020-04-06 NOTE — Progress Notes (Signed)
Pt refusing CPAP for the night. No distress noted.  

## 2020-04-06 NOTE — Plan of Care (Signed)
Called by RN that pt urine dark red color from foley. It has been like this for the last several days. Hb down from 11.7 to 11.0 today. Will continue monitoring CBC and urine output. D/c plavix for now and change ASA from 325 to 81. Will check P2Y12 in am.   Marvel Plan, MD PhD Stroke Neurology 04/06/2020 4:02 PM

## 2020-04-06 NOTE — Progress Notes (Signed)
STROKE TEAM PROGRESS NOTE   INTERVAL HISTORY Pt sitting in bed, no family at bedside. Pt awake alert, not in distress, drinking water from cup, interactive. WBC trending down. On rocephin. Cre continue to elevate, lisinopril d/c'ed, put pt on metoprolol. Also liver enzyme elevated. Pravastatin d/c'ed.   Vitals:   04/05/20 1647 04/05/20 1940 04/05/20 2334 04/06/20 0401  BP: 120/70 126/72 133/87 (!) 156/82  Pulse: 82 85 83 81  Resp: 14 16 16 18   Temp: 98.3 F (36.8 C) 98.4 F (36.9 C) 98.7 F (37.1 C) 98.4 F (36.9 C)  TempSrc: Oral Oral Oral Oral  SpO2: 98% 97% 94% 97%  Weight:       CBC:  Recent Labs  Lab 04/05/20 0355 04/06/20 0323  WBC 23.2* 18.3*  HGB 11.7* 11.0*  HCT 36.0 34.2*  MCV 90.0 91.7  PLT 487* 434*   Basic Metabolic Panel:  Recent Labs  Lab 03/30/20 1111 03/31/20 0454 04/01/20 0554 04/05/20 0355  NA  --   --  143 135  K  --   --  4.6 4.0  CL  --   --  107 102  CO2  --   --  24 20*  GLUCOSE  --   --  207* 151*  BUN  --   --  47* 53*  CREATININE  --   --  1.51* 1.66*  CALCIUM  --   --  9.4 9.0  MG  --  2.0  --   --   PHOS 1.5* 4.6  --   --     IMAGING past 24 hours DG CHEST PORT 1 VIEW  Result Date: 04/05/2020 CLINICAL DATA:  Leukocytosis.  History of hypertension and diabetes. EXAM: PORTABLE CHEST 1 VIEW COMPARISON:  03/26/2020 FINDINGS: Cardiac silhouette is normal in size. No mediastinal or hilar masses. Clear lungs.  No convincing pleural effusion and no pneumothorax. Enteric feeding tube passes through the stomach and below the included field of view. Skeletal structures are grossly intact. IMPRESSION: No acute cardiopulmonary disease. Electronically Signed   By: 03/28/2020 M.D.   On: 04/05/2020 13:34    PHYSICAL EXAM   Temp:  [98 F (36.7 C)-98.7 F (37.1 C)] 98.4 F (36.9 C) (06/27 0401) Pulse Rate:  [79-91] 81 (06/27 0401) Resp:  [14-18] 18 (06/27 0401) BP: (120-156)/(70-88) 156/82 (06/27 0401) SpO2:  [94 %-98 %] 97 % (06/27  0401)  General - Well nourished, well developed, awake alert, no distress  Ophthalmologic - fundi not visualized due to noncooperation.  Cardiovascular - Regular rhythm and rate.  Neuro - patient is awake alert, eyes open, orientated to place, age, self but not to time. Mild dysarthria, follows simple commands. Left neglect. Eyes right gaze preference, not crossing midline. PERRL. Not blinking to visual threat on the left. Left facial droop. Tongue protrusion midline. LUE mild withdraw to pain. LLE 1/5 withdraw on pain stimulation. RUE spontaneous against gravity and purposeful. RLE 3/5 on pain stimulation. Sensation, coordination not cooperative and gait not tested   ASSESSMENT/PLAN [name] is a 74 y.o. female with history of HTN, HLD, DB presenting with slurred speech, L sided weakness and ataxia. Received IV tPA 03/23/2020 at 2252. Worsening in ED. CTA confirmed distal R M1 occlusion. Taken to IR.    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 ASPECTS 10.  CTA head & neck distal R M2 occlusion w/ moderate collateral flow R MCA territory.   Cerebral angio angio w/ TICI3 revascularization R M1 occlusion   Post IR CT no ICH or mass effect   CT head 6/14 0234 no acute abnormality. Small vessel disease.   CT head 6/14 1232 R MCA territory infarct sparing basal ganglia  CT head 6/14 2159 progressive R MCA territory infarct w/ basal ganglia sparing. Hyperdense R M2  MRI  Large R MCA territory infarct w/ petechial hemorrhage. Scattered small R PCA territory infarcts. Punctate poster L periventricular WM infarct. Old B basal ganglia and thalamic lacunes.   MRA  Reocclusion distal R M1  EEG R frontal sharp waves, generalized and lateralized R brain slowing  LE Doppler  No DVT  2D Echo EF 70-75%. No obvious source of embolus.  ?Echodensity L side of MV  TEE normal MV, EF 70-75%, no PFO  Plan loop recorder before d/c to CIR  TG >500->247, Direct LDL 63.8  HgbA1c 8.9  Heparin 5000 units sq tid for VTE prophylaxis  aspirin 81 mg daily prior to admission, now on ASA 325 & Plavix 75 DAPT for 3 months and then plavix alone given right MCA reocclusion.   Therapy recommendations:  CIR    Disposition:  pending   Acute Respiratory Failure, resolved  Intubated for IR, left intubated d/t slow response to reversal and involuntary flapping of RUE   Extubated 6/14 followed by coffee ground emesis   Bronchial hygeine  Was on nasal trumpet -> now off  Tolerating extubation well now  On room air  Leukocytosis   KUB w/o ileus, + hydroureteronephrosis -> outpt urology follow up  6/14 UA w/ WBC > 50 & UCx E coli  BCx no growth   Cefepime 6/14>>6/15  Meropenem 6/15>>6/17  Keflex 6/17>>6/21  Leukocytosis, WBC 11.1->15.6->23.2 ->18.3 (afebrile)   6/26 U/A w/ WBC > 50   Urine culture 6/26 >100,000 colonies E coli - sensitivities pending  Rocephin restarted 6/26>>  AKI  Creatinine 1.12-1.51-1.66-> 1.71  continue IV fluid 75 cc/h  Encourage p.o. intake - pt is drinking  D/c lisinopril   BMP monitoring  Hypertension  Home meds:  amlodipine 5, lisinopril 20, metoprolol 100 bid  . On Cleviprex -> off  . BP stable  . On amlodipine 10, metoprolol 25 bid . Lisinopril d/c'ed due to AKI . Long-term BP goal 130-150 given right MCA reocclusion  Hyperlipidemia  Home meds:  pravachol 40  TG >500->247, Direct LDL 63.8, goal < 70  pravastatin 40 -> on hold due to elevated LFTs  On zetia 10  AST/ALT 44/72 ->   Resume pravastatin once LFT normalized  Diabetes type II Uncontrolled  Home meds:  trulicity 7.20, glimepiride 1, metformin 500 bid, ozempic 0.25-0.5  HgbA1c 8.9, goal < 7.0  CBGs  SSI 0-20  Diabetic coordinator following  On Levemir to 15u Bid   Close PCP follow up for  better DM control  Dysphagia . Secondary to stroke . Now on Dys 1 and honey thick -> eating well so far . Cortrak placed -> removed 6/26 . On IVF  . Speech on board   Other Stroke Risk Factors  Advanced age  Family hx stroke (father)  Other Active Problems  Hypokalemia 3.4 - supplement - 4.6->4.0 resolved  Lethargy - on amantadine  Hospital day # 13   Rosalin Hawking, MD PhD Stroke Neurology 04/06/2020 2:54 PM  To contact Stroke Continuity provider, please refer to [email or web address] After  hours, contact General Neurology

## 2020-04-06 NOTE — Progress Notes (Signed)
Occupational Therapy Treatment Patient Details [name] MRN: [id number] DOB: November 29, 1945 Today's Date: 04/06/2020    History of present illness 74 y.o female admitted with sudden onset Lt sided weakness and surred speech.  Tpa was administered.  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 pt extubated 6/14, but had increasing lethsrgy > possibly septic due to UTI.  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  PMH includes: hypertensive retinopathy, DM, arthritis, s/p TKA, s/p ORIF Rt hip, s/p ORIF LT hip.   OT comments  Pt. Seen for skilled OT treatment session.  Focus on visual attention and tracking to the L.  Pt. Did well with locating item on R or center field then tracking to L.  Responded well to the repetition and progression of task.  Smiling when she was able to reach and hand item to me on L side.  Remains great CIR candidate.     Follow Up Recommendations  CIR    Equipment Recommendations  Wheelchair (measurements OT);Wheelchair cushion (measurements OT);Hospital bed    Recommendations for Other Services Rehab consult    Precautions / Restrictions Precautions Precautions: Fall Precaution Comments: L hemi/neglect       Mobility Bed Mobility                  Transfers                      Balance                                           ADL either performed or assessed with clinical judgement   ADL                                               Vision   Additional Comments: utilized pts. glasses case for item location and visual tracking.  initially place on right side and pt. able to reach r hand and obtain the case.  slowly shifted to center and pt. able to adjust and hand to me.  slowly transitioned to left side while pt. holding it and she was able to follow me both with just her eyes  then eyes and head to the left to hand to me.  big smile when she completed these tasks. completed multiple times.  each time pt. following with eyes in more smooth pursuits.  facilitated with head/neck stretch and also had pt. actively turn head left and right to facilitate with ease with visual env. L side.   Perception     Praxis      Cognition                                                Exercises     Shoulder Instructions       General Comments      Pertinent Vitals/ Pain          Home Living  Prior Functioning/Environment              Frequency  Min 2X/week        Progress Toward Goals  OT Goals(current goals can now be found in the care plan section)  Progress towards OT goals: Progressing toward goals     Plan Discharge plan remains appropriate;Frequency remains appropriate    Co-evaluation                 AM-PAC OT "6 Clicks" Daily Activity     Outcome Measure   Help from another person eating meals?: Total Help from another person taking care of personal grooming?: A Lot Help from another person toileting, which includes using toliet, bedpan, or urinal?: Total Help from another person bathing (including washing, rinsing, drying)?: Total Help from another person to put on and taking off regular upper body clothing?: A Lot Help from another person to put on and taking off regular lower body clothing?: Total 6 Click Score: 8    End of Session    OT Visit Diagnosis: Unsteadiness on feet (R26.81);Other abnormalities of gait and mobility (R26.89);Other symptoms and signs involving the nervous system (R29.898);Other symptoms and signs involving cognitive function;Hemiplegia and hemiparesis Hemiplegia - Right/Left: Left Hemiplegia - dominant/non-dominant: Non-Dominant Hemiplegia - caused by: Cerebral infarction   Activity Tolerance Patient tolerated treatment  well   Patient Left in bed;with call bell/phone within reach   Nurse Communication          Time: 9390-3009 OT Time Calculation (min): 8 min  Charges: OT General Charges $OT Visit: 1 Visit OT Treatments $Therapeutic Activity: 8-22 mins  Boneta Lucks, COTA/L Acute Rehabilitation [phone]   Robet Leu 04/06/2020, 9:55 AM

## 2020-04-07 ENCOUNTER — Encounter (HOSPITAL_COMMUNITY): Payer: Self-pay | Admitting: Neurology

## 2020-04-07 ENCOUNTER — Ambulatory Visit: Payer: Self-pay | Admitting: Physician Assistant

## 2020-04-07 DIAGNOSIS — N390 Urinary tract infection, site not specified: Secondary | ICD-10-CM | POA: Diagnosis not present

## 2020-04-07 DIAGNOSIS — R319 Hematuria, unspecified: Secondary | ICD-10-CM

## 2020-04-07 LAB — GLUCOSE, CAPILLARY
Glucose-Capillary: 86 mg/dL (ref 70–99)
Glucose-Capillary: 139 mg/dL — ABNORMAL HIGH (ref 70–99)
Glucose-Capillary: 153 mg/dL — ABNORMAL HIGH (ref 70–99)
Glucose-Capillary: 67 mg/dL — ABNORMAL LOW (ref 70–99)
Glucose-Capillary: 103 mg/dL — ABNORMAL HIGH (ref 70–99)

## 2020-04-07 LAB — BASIC METABOLIC PANEL
Anion gap: 10 (ref 5–15)
BUN: 30 mg/dL — ABNORMAL HIGH (ref 8–23)
CO2: 19 mmol/L — ABNORMAL LOW (ref 22–32)
Calcium: 8.7 mg/dL — ABNORMAL LOW (ref 8.9–10.3)
Chloride: 113 mmol/L — ABNORMAL HIGH (ref 98–111)
Creatinine, Ser: 1.3 mg/dL — ABNORMAL HIGH (ref 0.44–1.00)
GFR calc Af Amer: 47 mL/min — ABNORMAL LOW (ref >60)
GFR calc non Af Amer: 41 mL/min — ABNORMAL LOW (ref >60)
Glucose, Bld: 104 mg/dL — ABNORMAL HIGH (ref 70–99)
Potassium: 3.9 mmol/L (ref 3.5–5.1)
Sodium: 142 mmol/L (ref 135–145)

## 2020-04-07 LAB — CBC
HCT: 32.4 % — ABNORMAL LOW (ref 36.0–46.0)
Hemoglobin: 10.4 g/dL — ABNORMAL LOW (ref 12.0–15.0)
MCH: 29.8 pg (ref 26.0–34.0)
MCHC: 32.1 g/dL (ref 30.0–36.0)
MCV: 92.8 fL (ref 80.0–100.0)
Platelets: 384 10*3/uL (ref 150–400)
RBC: 3.49 MIL/uL — ABNORMAL LOW (ref 3.87–5.11)
RDW: 14.4 % (ref 11.5–15.5)
WBC: 12.5 10*3/uL — ABNORMAL HIGH (ref 4.0–10.5)
nRBC: 0 % (ref 0.0–0.2)

## 2020-04-07 NOTE — Progress Notes (Signed)
Speech Language Pathology Treatment: Dysphagia;Cognitive-Linquistic  Patient Details [name] MRN: [id number] DOB: 1946-07-29 Today's Date: 04/07/2020 Time: 1230-1309 SLP Time Calculation (min) (ACUTE ONLY): 39 min  Assessment / Plan / Recommendation Clinical Impression  Meredith Shaw was participatory in cognitive/dysarthria/dysphagia treatment. She was partially reclined in bed, leaning R upon SLP arrival with meal tray present. SLP re-positioned patient and assisted in feeding of recommended consistencies (puree/DYS 3 and honey thick liquids). Pt was noted with thin liquid water with straw at bedside, which was thrown away. Water was thickened for her to keep at bedside. She had very slow oral manipulation with mild hold noted prior to swallow. She did not have pocketing observed with purees, but trace-mild diffuse residue remains, which clears with alternating between solids/liquids. Pt fed with use of hand over hand. She had no s/s aspiration, however, is noted to be a silent aspirator from prior MBSSs. She had last MBSS 5 days ago and one before that 10 days ago which improved from NPO recommendations to honey thick and puree recommendations. Based on improved pocketing, pt may benefit from another MBSS prior to discharge.   Speech/Cognition: pt was oriented to self, situation, month, but not year or date. She also initially reported she was in [place], but when asked if she is from [place], she said, "oh no, I mean [place]." She verbalized family members names with good intelligibility despite ongoing moderate dysarthria. She was provided a handout for both memory and speech intelligibility strategies, which were reviewed with her and her husband. Memory strategies include: write it down, repeat it, associate it, picture it. Speech strategies include: speak slowly, loudly, open mouth, pause between words, every sound counts. She participated in visual scanning treatment for functional  task of feeding and required mod-max cues to look L to locate her food/liquid. Pt demonstrates a severe visual neglect. Recommend ongoing intensive treatment; inpatient rehab.   HPI HPI: 74 y.o female admitted with sudden onset Lt sided weakness and slurred speech.  Tpa was administered.  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 Pt extubated 6/14, but had increasing lethargy > possibly septic due to UTI.  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 MBS 6/18 showed intermittent aspiration across consistencies; NPO was recommended and cortrak was placed.  Repeat MBS 6/23: persisting dysphagia marked by oral holding, decreased initiation, and anterior left spillage and residue.  There was continued silent, trace aspiration of nectar thick liquids before the swallow; honey thick liquids were not aspirated. There was good pharyngeal clearance/no residue post-swallow. A puree diet with honey thick liquids was recommended at that time.       SLP Plan  Continue with current plan of care       Recommendations  Diet recommendations: Honey-thick liquid;Dysphagia 1 (puree) Liquids provided via: Straw;Cup Medication Administration: Crushed with puree Supervision: Staff to assist with self feeding Compensations: Monitor for anterior loss;Small sips/bites;Slow rate;Follow solids with liquid Postural Changes and/or Swallow Maneuvers: Seated upright 90 degrees                General recommendations: Rehab consult Oral Care Recommendations: Oral care QID Follow up Recommendations: Inpatient Rehab SLP Visit Diagnosis: Dysphagia, oropharyngeal phase (R13.12);Dysarthria and anarthria (R47.1);Cognitive communication deficit (R41.841) Plan: Continue with current plan of care                    Terrick Allred P. Cuinn Westerhold, M.S., CCC-SLP  Speech-Language Pathologist Acute Rehabilitation  Services Pager: [place] 04/07/2020, 1:31 PM

## 2020-04-07 NOTE — Progress Notes (Signed)
STROKE TEAM PROGRESS NOTE   INTERVAL HISTORY Pt lying in bed, husband at bedside. Pt awake alert, not in distress, interactive. Neuro stable. WBC trending down, Cre improving. On rocephin. Still has hematuria, Hb stable, will have urology on board.   Vitals:   04/07/20 0412 04/07/20 0500 04/07/20 0804 04/07/20 1232  BP: 137/75  (!) 153/82 (!) 147/74  Pulse: 60  69 64  Resp: 17  18 20   Temp: (!) 97.5 F (36.4 C)  98.2 F (36.8 C) 98.1 F (36.7 C)  TempSrc: Oral  Oral Oral  SpO2: 100%  100% 100%  Weight:  77.9 kg     CBC:  Recent Labs  Lab 04/06/20 0323 04/07/20 0605  WBC 18.3* 12.5*  HGB 11.0* 10.4*  HCT 34.2* 32.4*  MCV 91.7 92.8  PLT 434* 494   Basic Metabolic Panel:  Recent Labs  Lab 04/06/20 0323 04/07/20 0605  NA 139 142  K 4.3 3.9  CL 107 113*  CO2 21* 19*  GLUCOSE 185* 104*  BUN 45* 30*  CREATININE 1.71* 1.30*  CALCIUM 8.7* 8.7*    IMAGING past 24 hours No results found.  PHYSICAL EXAM   Temp:  [97.5 F (36.4 C)-98.3 F (36.8 C)] 98.1 F (36.7 C) (06/28 1232) Pulse Rate:  [60-72] 64 (06/28 1232) Resp:  [16-20] 20 (06/28 1232) BP: (120-164)/(66-91) 147/74 (06/28 1232) SpO2:  [98 %-100 %] 100 % (06/28 1232) Weight:  [77.9 kg] 77.9 kg (06/28 0500)  General - Well nourished, well developed, awake alert, no distress  Ophthalmologic - fundi not visualized due to noncooperation.  Cardiovascular - Regular rhythm and rate.  Neuro - patient is awake alert, eyes open, orientated to place, age, self but not to time. Mild dysarthria, follows simple commands. Left neglect. Eyes right gaze preference, not crossing midline. PERRL. Not blinking to visual threat on the left. Left facial droop. Tongue protrusion midline. LUE mild withdraw to pain. LLE 1/5 withdraw on pain stimulation. RUE spontaneous against gravity and purposeful. RLE 3/5 on pain stimulation. Sensation, coordination not cooperative and gait not tested   ASSESSMENT/PLAN [name]  is a 74 y.o. female with history of HTN, HLD, DB presenting with slurred speech, L sided weakness and ataxia. Received IV tPA 03/23/2020 at 2252. Worsening in ED. CTA confirmed distal R M1 occlusion. Taken to IR.    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 ASPECTS 10.     CTA head & neck distal R M2 occlusion w/ moderate collateral flow R MCA territory.   Cerebral angio angio w/ TICI3 revascularization R M1 occlusion   Post IR CT no ICH or mass effect   CT head 6/14 0234 no acute abnormality. Small vessel disease.   CT head 6/14 1232 R MCA territory infarct sparing basal ganglia  CT head 6/14 2159 progressive R MCA territory infarct w/ basal ganglia sparing. Hyperdense R M2  MRI  Large R MCA territory infarct w/ petechial hemorrhage. Scattered small R PCA territory infarcts. Punctate poster L periventricular WM infarct. Old B basal ganglia and thalamic lacunes.   MRA  Reocclusion distal R M1  EEG R frontal sharp waves, generalized and lateralized R brain slowing  LE Doppler  No DVT  2D Echo EF 70-75%. No obvious source of embolus. ?Echodensity L side of MV  TEE normal MV, EF 70-75%, no PFO  Plan loop recorder before d/c to CIR  TG >500->247, Direct LDL 63.8  HgbA1c 8.9  Heparin 5000 units sq tid for VTE prophylaxis  aspirin 81 mg daily prior to admission, was on ASA 325 & Plavix 75 DAPT -> now ASA 81 due to hematuria   Therapy recommendations:  CIR    Disposition:  pending   Acute Respiratory Failure, resolved  Intubated for IR, left intubated d/t slow response to reversal and involuntary flapping of RUE   Extubated 6/14 followed by coffee ground emesis   Bronchial hygeine  Was on nasal trumpet -> now off  Tolerating extubation well now  On room air  Leukocytosis   KUB w/o ileus, + hydroureteronephrosis -> outpt  urology follow up  6/14 UA w/ WBC > 50 & UCx E coli  BCx no growth   Cefepime 6/14>>6/15  Meropenem 6/15>>6/17  Keflex 6/17>>6/21  Leukocytosis, WBC 11.1->15.6->23.2 ->18.3->12.5   6/26 U/A w/ WBC > 50   Urine culture 6/26 >100,000 colonies E coli  Rocephin restarted 6/26>>  Hematuria   Gross hematuria x 2-3 days  Earlier this admission she has hydroureteronephrosis  Now has recurrent UTI  Has foley catheter  Hb 12.4->11.7->11.0->10.4  ASA 325->81 and plavix on hold  Urology consulted - appreciate help  AKI  Creatinine 1.12-1.51-1.66-> 1.71->1.30  continue IV fluid 75 cc/h  Encourage p.o. intake - pt is drinking  D/c lisinopril   BMP monitoring  Hypertension  Home meds:  amlodipine 5, lisinopril 20, metoprolol 100 bid  . On Cleviprex -> off  . BP stable  . On amlodipine 10, metoprolol 25 bid . Lisinopril d/c'ed due to AKI . Long-term BP goal 130-150 given right MCA reocclusion  Hyperlipidemia  Home meds:  pravachol 40  TG >500->247, Direct LDL 63.8, goal < 70  pravastatin 40 -> on hold due to elevated LFTs  On zetia 10  AST/ALT 44/72 -> pending  Resume pravastatin once LFT normalized  Diabetes type II Uncontrolled  Home meds:  trulicity 0.75, glimepiride 1, metformin 500 bid, ozempic 0.25-0.5  HgbA1c 8.9, goal < 7.0  CBGs  SSI 0-20  Diabetic coordinator following  On Levemir to 15u Bid   Close PCP follow up for better DM control  Dysphagia . Secondary to stroke . Now on Dys 1 and honey thick -> eating well so far . Cortrak placed -> removed 6/26 . On IVF  . Speech on board   Other Stroke Risk Factors  Advanced age  Family hx stroke (father)  Other Active Problems  Hypokalemia 3.4 - supplement - 4.6->4.0->3.9  Lethargy - on amantadine  Hospital day # 14   Patient condition worsened within the last 24 hours, has developed continued gross hematuria, continues to have AKI, leukocytosis, left hemiplegia, and I  placed urology consult, discontinue plavix and decreased ASA dose. I spent  35 minutes in total face-to-face time with the patient, more than 50% of which was spent in counseling and coordination of care, reviewing test results, images and medication, and discussing the diagnosis, treatment plan and potential prognosis. This patient's care requiresreview of multiple databases, neurological assessment, discussion with family, other specialists and medical decision making of high complexity. I had long discussion with husband at bedside, updated pt current condition, treatment plan and potential prognosis, and answered all the questions. He expressed understanding and appreciation.    Marvel Plan, MD PhD Stroke Neurology 04/07/2020 2:55 PM  To contact Stroke Continuity provider, please refer to [email or web address]. After hours, contact General Neurology

## 2020-04-07 NOTE — Progress Notes (Signed)
Calorie Count Note  48 hour calorie count ordered.  Diet: dysphagia 1 diet with honey thick liquids Supplements: Magic cup TID with meals, each supplement provides 290 kcal and 9 grams of protein; MVI with minerals daily  Cortrak and TF d/c on 04/05/20  Spoke with pt at bedside, who reports her swallowing is improving and she ate "right much of her breakfast".   Case discussed with RN, who reports pt is a total assist. She takes medications crushed with applesauce. Per his report, she only at about 20% of breakfast and required a lot of encouragement. Eventual plan to d/c to CIR vs SNF.   04/05/20 Breakfast: 358 kcals, 16 grams protein Lunch: n/a Dinner: 130 kcals, 3 grams protein  Total intake: 488 kcal (28% of minimum estimated needs)  16 grams protein (18% of minimum estimated needs)  04/06/20 Breakfast: 495 kcals, 21 grams protein Lunch: 774 kcals, 13 grams protein Dinner: 631 kcals, 5 grams protein  Total intake: 1900 kcal (100% of minimum estimated needs)  39 grams protein (43% of minimum estimated needs)  Average Total intake: 1129 kcal (65% of minimum estimated needs)  28 grams protein (28% of minimum estimated needs)  Nutrition Dx: Increased nutrient needs related to acute illness as evidenced by estimated needs; ongoing  Goal: Patient will meet greater than or equal to 90% of their needs; unmet  Intervention:   -D/c calorie count -Continue MVI with minerals daily -Continue Magic cup TID with meals, each supplement provides 290 kcal and 9 grams of protein -Snacks TID between meals  Levada Schilling, RD, LDN, CDCES Registered Dietitian II Certified Diabetes Care and Education Specialist Please refer to [facility] for RD and/or RD on-call/weekend/after hours pager

## 2020-04-07 NOTE — Progress Notes (Signed)
Pt refusing cpap for the night. RT will continue to monitor as needed. 

## 2020-04-07 NOTE — Progress Notes (Signed)
Physical Therapy Treatment Patient Details [name] MRN: [id number] DOB: 04/14/1946 Today's Date: 04/07/2020    History of Present Illness 74 y.o female admitted with sudden onset Lt sided weakness and surred speech.  Tpa was administered.  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 pt extubated 6/14, but had increasing lethsrgy > possibly septic due to UTI.  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  PMH includes: hypertensive retinopathy, DM, arthritis, s/p TKA, s/p ORIF Rt hip, s/p ORIF LT hip.    PT Comments    Patient is making progress toward PT goals and tolerated session well. Pt is alert, verbalizing more and more clearly, able to visually track to midline with less cues, and following single step commands consistently. Pt able to maintain sitting balance EOB with min guard to min-mod A depending on UE support and laterally scoot to drop arm recliner with max A +2. Continue to recommend CIR for further skilled PT services to maximize independence and safety with mobility.     Follow Up Recommendations  CIR     Equipment Recommendations  Hospital bed;Other (comment) (mechanical lift if d/c home, other equipment TBD)    Recommendations for Other Services Rehab consult     Precautions / Restrictions Precautions Precautions: Fall Precaution Comments: L hemi/neglect    Mobility  Bed Mobility Overal bed mobility: Needs Assistance Bed Mobility: Supine to Sit     Supine to sit: Max assist;+2 for physical assistance     General bed mobility comments: multimodal cues for sequencing; use of rail and bed pad; assist to bring bilat LE/hips to EOB and to elevate trunk into sitting   Transfers Overall transfer level: Needs assistance   Transfers: Lateral/Scoot Transfers          Lateral/Scoot Transfers: Max assist;+2 physical assistance General  transfer comment: cues for anterior lean, hand placement, and sequencing; assist to scoot with bed pad and to mobilize L LE during transfer; pt able to pull herslef forward in recliner for positioning with cues   Ambulation/Gait                 Stairs             Wheelchair Mobility    Modified Rankin (Stroke Patients Only) Modified Rankin (Stroke Patients Only) Pre-Morbid Rankin Score: No symptoms Modified Rankin: Severe disability     Balance Overall balance assessment: Needs assistance Sitting-balance support: Single extremity supported;Feet supported;Bilateral upper extremity supported Sitting balance-Leahy Scale: Poor Sitting balance - Comments: min guard assist needed when pt holding onto rail with R UE; min-mod A required without holding to rail; worked on hands in lap and correcting midline posture; pt able to correct with verbal and visual cues  Postural control: Left lateral lean;Posterior lean                                  Cognition Arousal/Alertness: Awake/alert Behavior During Therapy: WFL for tasks assessed/performed (mostly flat but with appropriate facial expressions at times) Overall Cognitive Status: Impaired/Different from baseline Area of Impairment: Attention;Following commands;Safety/judgement;Problem solving;Memory                   Current Attention Level: Sustained Memory: Decreased short-term memory Following Commands: Follows one step commands with increased time;Follows one step commands consistently Safety/Judgement: Decreased awareness of deficits  Problem Solving: Slow processing;Requires verbal cues;Decreased initiation;Difficulty sequencing;Requires tactile cues General Comments: pt speaking more clearly this session and answering appropriately; pt able to visually track to midline more often and with less cues needed and held onto L hand with R hand when given multimodal cues       Exercises       General Comments        Pertinent Vitals/Pain Pain Assessment: No/denies pain    Home Living                      Prior Function            PT Goals (current goals can now be found in the care plan section) Progress towards PT goals: Progressing toward goals    Frequency    Min 4X/week      PT Plan Current plan remains appropriate    Co-evaluation              AM-PAC PT "6 Clicks" Mobility   Outcome Measure  Help needed turning from your back to your side while in a flat bed without using bedrails?: A Lot Help needed moving from lying on your back to sitting on the side of a flat bed without using bedrails?: Total Help needed moving to and from a bed to a chair (including a wheelchair)?: Total Help needed standing up from a chair using your arms (e.g., wheelchair or bedside chair)?: A Lot Help needed to walk in hospital room?: Total Help needed climbing 3-5 steps with a railing? : Total 6 Click Score: 8    End of Session Equipment Utilized During Treatment: Gait belt Activity Tolerance: Patient tolerated treatment well Patient left: with call bell/phone within reach;in chair;with chair alarm set Nurse Communication: Mobility status;Need for lift equipment PT Visit Diagnosis: Other abnormalities of gait and mobility (R26.89);Muscle weakness (generalized) (M62.81);History of falling (Z91.81);Other symptoms and signs involving the nervous system (R29.898)     Time: 5916-3846 PT Time Calculation (min) (ACUTE ONLY): 23 min  Charges:  $Therapeutic Activity: 8-22 mins $Neuromuscular Re-education: 8-22 mins                     Erline Levine, PTA Acute Rehabilitation Services Pager: [phone] Office: [phone]     Carolynne Edouard 04/07/2020, 4:52 PM

## 2020-04-07 NOTE — Progress Notes (Addendum)
Exchanged foley cath with new 18G fr per MD order, red urine return noted

## 2020-04-07 NOTE — Consult Note (Signed)
Urology Consult   Physician requesting consult: Marvel Plan  Reason for consult: Hematuria  History of Present Illness: Meredith Shaw is a 74 y.o. female with PMH significant for recurrent UTIs, nephrolithiasis, HTN, DM, and hypothyroidism who was admitted for treatment of CVA on 03/23/20.  Per primary team pt has had hematuria for several days. She has had a foley and been maintained on anticoagulation (SQ Heparin, Plavix, and ASA) since admission.  Leukocytosis was noted on 6/14 and urine culture revealed >100k E coli treated with Cefepime, meropenem, and keflex. Keflex was completed on 03/31/20.  On 04/05/20 she was again noted to have a leukocytosis.  Urine culture again shows >100k E Coli and 50k colonies Klebsiella pneumoniae.  E coli is pan sensitive except for cipro and bactrim.  Klebsiella sensitivities are pending. She is currently receiving IV Rocephin.  WBC is improving. KUB on 03/24/20 showed mild bilateral hydro with no obstruction and RUS on 03/24/20 showed no obstruction or hydro.   Pt has been treated in our office in the past for nephrolithiasis and recurrent UTIs, however, she has not been seen or required treatment in over a year. She states she has had hematuria in the past but only with stones or infection.  She doesn't think she has had any "in a long time".    Past Medical History:  Diagnosis Date  . Arthritis    OA AND PAIN BOTH KNEES -RIGHT KNEE HURTS WORSE THAN LEFT  . Cataract   . Diabetes mellitus    ORAL MEDICATION - NO INSULIN  . Hypertension   . Hypertensive retinopathy    OU  . Hypothyroidism   . Kidney stones    NONE AT PRESENT TIME THAT PT IS AWARE OF  . Osteoporosis     Past Surgical History:  Procedure Laterality Date  . ABDOMINAL HYSTERECTOMY    . BUBBLE STUDY  03/26/2020   Procedure: BUBBLE STUDY;  Surgeon: Lars Masson, MD;  Location: [facility] ENDOSCOPY;  Service: Cardiovascular;;  . CATARACT EXTRACTION    . CHOLECYSTECTOMY  1972  . COLONOSCOPY     . EYE SURGERY     RIGHT CATARACT EXTRACTON WITH LENS IMPLANT  . GROWTH REMOVED FROM THYROID    . INTRAMEDULLARY (IM) NAIL INTERTROCHANTERIC Left 02/10/2018   Procedure: INTRAMEDULLARY (IM) NAIL INTERTROCHANTRIC;  Surgeon: Kathryne Hitch, MD;  Location: [facility] OR;  Service: Orthopedics;  Laterality: Left;  . INTRAMEDULLARY (IM) NAIL INTERTROCHANTERIC Right 08/20/2018   Procedure: ORIF RIGHT HIP INTERTROC FRACTURE;  Surgeon: Kathryne Hitch, MD;  Location: [facility] OR;  Service: Orthopedics;  Laterality: Right;  . IR CT HEAD LTD  03/24/2020  . IR PERCUTANEOUS ART THROMBECTOMY/INFUSION INTRACRANIAL INC DIAG ANGIO  03/24/2020  . left hip surgery   02/2018  . ORIF HIP FRACTURE Right 08/20/2018  . PERCUTANEOUS NEPHROLITHOTOMY    . RADIOLOGY WITH ANESTHESIA N/A 03/23/2020   Procedure: IR WITH ANESTHESIA;  Surgeon: Julieanne Cotton, MD;  Location: [facility] OR;  Service: Radiology;  Laterality: N/A;  . TEE WITHOUT CARDIOVERSION N/A 03/26/2020   Procedure: TRANSESOPHAGEAL ECHOCARDIOGRAM (TEE);  Surgeon: Lars Masson, MD;  Location: [facility] ENDOSCOPY;  Service: Cardiovascular;  Laterality: N/A;  . TOTAL KNEE ARTHROPLASTY  06/09/2012   Procedure: TOTAL KNEE ARTHROPLASTY;  Surgeon: Kathryne Hitch, MD;  Location: [facility] ORS;  Service: Orthopedics;  Laterality: Right;  Right Total Knee Arthroplasty  . TOTAL KNEE ARTHROPLASTY Left 04/09/2014   Procedure: LEFT TOTAL KNEE ARTHROPLASTY;  Surgeon: Kathryne Hitch, MD;  Location: [facility]  OR;  Service: Orthopedics;  Laterality: Left;  . TUBAL LIGATION       Current Hospital Medications:  Home meds:  Current Facility-Administered Medications for the 03/23/20 encounter [facility](Hospital Encounter)  Medication  . Bevacizumab (AVASTIN) SOLN 1.25 mg  . denosumab (PROLIA) injection 60 mg   Current Meds  Medication Sig  . amLODipine (NORVASC) 5 MG tablet TAKE ONE (1) TABLET EACH DAY (Patient taking differently: Take 5 mg by mouth daily. )  . aspirin EC 81 MG tablet  Take 81 mg by mouth daily.  . cholecalciferol (VITAMIN D) 1000 units tablet Take 5,000 Units by mouth daily.   Marland Kitchen. denosumab (PROLIA) 60 MG/ML SOSY injection Inject 60 mg into the skin every 6 (six) months.  . Dulaglutide (TRULICITY) 0.75 MG/0.5ML SOPN Inject 0.75 mg into the skin once a week.   Marland Kitchen. glimepiride (AMARYL) 1 MG tablet Take 1 mg by mouth daily with breakfast.   . ibuprofen (ADVIL) 200 MG tablet Take 400 mg by mouth 2 (two) times daily as needed for moderate pain.   Marland Kitchen. levothyroxine (SYNTHROID) 125 MCG tablet Take 1 tablet (125 mcg total) by mouth daily.  Marland Kitchen. lisinopril (ZESTRIL) 20 MG tablet Take 1 tablet (20 mg total) by mouth daily.  . metFORMIN (GLUCOPHAGE) 500 MG tablet Take 1 tablet (500 mg total) by mouth 2 (two) times daily with a meal. (Patient taking differently: Take 500 mg by mouth in the morning and at bedtime. )  . metoprolol tartrate (LOPRESSOR) 100 MG tablet TAKE ONE TABLET BY MOUTH TWICE DAILY (Patient taking differently: Take 100 mg by mouth 2 (two) times daily. )  . naproxen sodium (ALEVE) 220 MG tablet Take 220 mg by mouth 2 (two) times daily as needed (for pain).  . Omega-3 Fatty Acids (FISH OIL PO) Take 1 capsule by mouth daily.  . pravastatin (PRAVACHOL) 40 MG tablet Take 1 tablet (40 mg total) by mouth at bedtime.    Scheduled Meds: . amantadine  150 mg Oral Daily  . amLODipine  10 mg Oral Daily  . aspirin EC  81 mg Oral Daily  . bisacodyl  5 mg Oral Daily  . Chlorhexidine Gluconate Cloth  6 each Topical Daily  . ezetimibe  10 mg Oral Daily  . heparin injection (subcutaneous)  5,000 Units Subcutaneous Q8H  . insulin aspart  0-20 Units Subcutaneous TID WC & HS  . insulin detemir  10 Units Subcutaneous BID  . mouth rinse  15 mL Mouth Rinse BID  . metoprolol tartrate  25 mg Oral BID  . multivitamin with minerals  1 tablet Oral Daily  . pantoprazole  40 mg Oral Daily  . polyethylene glycol  17 g Oral Daily   Continuous Infusions: . sodium chloride 75 mL/hr at  04/07/20 1105  . cefTRIAXone (ROCEPHIN)  IV 1 g (04/06/20 1524)   PRN Meds:.acetaminophen **OR** acetaminophen (TYLENOL) oral liquid 160 mg/5 mL **OR** acetaminophen, metoprolol tartrate, ondansetron (ZOFRAN) IV, Resource ThickenUp Clear, senna-docusate  Allergies:  Allergies  Allergen Reactions  . Semaglutide Nausea And Vomiting    PO Rybelsus  . Farxiga [Dapagliflozin]     Yeast infections  . Penicillins Itching and Rash    Has tolerated Cefazlin and Ceftriaxone in past     Family History  Problem Relation Age of Onset  . Heart disease Mother   . Breast cancer Mother        7360's  . Stroke Father   . Heart disease Father   . Healthy Daughter   .  Healthy Son   . Liver disease Maternal Grandmother        gall stones imbedded in liver - blead to death when in surgery   . Pneumonia Maternal Grandfather   . Diabetes Paternal Grandmother     Social History:  reports that she has never smoked. She has never used smokeless tobacco. She reports that she does not drink alcohol and does not use drugs.  ROS: A complete review of systems was performed.  All systems are negative except for pertinent findings as noted.  Physical Exam:  Vital signs in last 24 hours: Temp:  [97.5 F (36.4 C)-98.3 F (36.8 C)] 98.1 F (36.7 C) (06/28 1232) Pulse Rate:  [60-72] 64 (06/28 1232) Resp:  [16-20] 20 (06/28 1232) BP: (120-164)/(66-91) 147/74 (06/28 1232) SpO2:  [98 %-100 %] 100 % (06/28 1232) Weight:  [77.9 kg] 77.9 kg (06/28 0500) Constitutional:  Sleepy but oriented, No acute distress Cardiovascular: Regular rate and rhythm Respiratory: Normal respiratory effort GI: Abdomen is soft, nontender, nondistended, no abdominal masses GU: 89f foley in place with dark pink urine in bag and light pink urine in tubing Lymphatic: No lymphadenopathy Neurologic: dysarthria Psychiatric: Normal mood and affect  Laboratory Data:  Recent Labs    04/05/20 0355 04/06/20 0323 04/07/20 0605  WBC  23.2* 18.3* 12.5*  HGB 11.7* 11.0* 10.4*  HCT 36.0 34.2* 32.4*  PLT 487* 434* 384    Recent Labs    04/05/20 0355 04/06/20 0323 04/07/20 0605  NA 135 139 142  K 4.0 4.3 3.9  CL 102 107 113*  GLUCOSE 151* 185* 104*  BUN 53* 45* 30*  CALCIUM 9.0 8.7* 8.7*  CREATININE 1.66* 1.71* 1.30*     Results for orders placed or performed during the hospital encounter of 03/23/20 (from the past 24 hour(s))  Glucose, capillary     Status: Abnormal   Collection Time: 04/06/20  9:21 PM  Result Value Ref Range   Glucose-Capillary 156 (H) 70 - 99 mg/dL   Comment 1 Notify RN    Comment 2 Document in Chart   CBC     Status: Abnormal   Collection Time: 04/07/20  6:05 AM  Result Value Ref Range   WBC 12.5 (H) 4.0 - 10.5 K/uL   RBC 3.49 (L) 3.87 - 5.11 MIL/uL   Hemoglobin 10.4 (L) 12.0 - 15.0 g/dL   HCT 18.5 (L) 36 - 46 %   MCV 92.8 80.0 - 100.0 fL   MCH 29.8 26.0 - 34.0 pg   MCHC 32.1 30.0 - 36.0 g/dL   RDW 63.1 49.7 - 02.6 %   Platelets 384 150 - 400 K/uL   nRBC 0.0 0.0 - 0.2 %  Basic metabolic panel     Status: Abnormal   Collection Time: 04/07/20  6:05 AM  Result Value Ref Range   Sodium 142 135 - 145 mmol/L   Potassium 3.9 3.5 - 5.1 mmol/L   Chloride 113 (H) 98 - 111 mmol/L   CO2 19 (L) 22 - 32 mmol/L   Glucose, Bld 104 (H) 70 - 99 mg/dL   BUN 30 (H) 8 - 23 mg/dL   Creatinine, Ser 3.78 (H) 0.44 - 1.00 mg/dL   Calcium 8.7 (L) 8.9 - 10.3 mg/dL   GFR calc non Af Amer 41 (L) >60 mL/min   GFR calc Af Amer 47 (L) >60 mL/min   Anion gap 10 5 - 15  Glucose, capillary     Status: None   Collection Time: 04/07/20  6:33 AM  Result Value Ref Range   Glucose-Capillary 86 70 - 99 mg/dL   Comment 1 Notify RN    Comment 2 Document in Chart   Glucose, capillary     Status: Abnormal   Collection Time: 04/07/20 11:26 AM  Result Value Ref Range   Glucose-Capillary 139 (H) 70 - 99 mg/dL   Recent Results (from the past 240 hour(s))  Urine Culture     Status: Abnormal (Preliminary result)    Collection Time: 04/05/20  1:01 PM   Specimen: Urine, Random  Result Value Ref Range Status   Specimen Description URINE, RANDOM  Final   Special Requests NONE  Final   Culture (A)  Final    >=100,000 COLONIES/mL ESCHERICHIA COLI 50,000 COLONIES/mL KLEBSIELLA PNEUMONIAE SUSCEPTIBILITIES TO FOLLOW Performed at [facility], [address] [place], [place] [address]    Report Status PENDING  Incomplete   Organism ID, Bacteria ESCHERICHIA COLI (A)  Final      Susceptibility   Escherichia coli - MIC*    AMPICILLIN 16 INTERMEDIATE Intermediate     CEFAZOLIN <=4 SENSITIVE Sensitive     CEFTRIAXONE <=0.25 SENSITIVE Sensitive     CIPROFLOXACIN >=4 RESISTANT Resistant     GENTAMICIN <=1 SENSITIVE Sensitive     IMIPENEM <=0.25 SENSITIVE Sensitive     NITROFURANTOIN <=16 SENSITIVE Sensitive     TRIMETH/SULFA >=320 RESISTANT Resistant     AMPICILLIN/SULBACTAM 4 SENSITIVE Sensitive     PIP/TAZO <=4 SENSITIVE Sensitive     * >=100,000 COLONIES/mL ESCHERICHIA COLI    Renal Function: Recent Labs    04/01/20 0554 04/05/20 0355 04/06/20 0323 04/07/20 0605  CREATININE 1.51* 1.66* 1.71* 1.30*   Estimated Creatinine Clearance: 41.4 mL/min (A) (by C-G formula based on SCr of 1.3 mg/dL (H)).  Radiologic Imaging: Narrative & Impression  CLINICAL DATA:  Vomiting.  EXAM: ABDOMEN - 1 VIEW  COMPARISON:  Abdominal x-ray dated September 25, 2015.  FINDINGS: The bowel gas pattern is normal. No radio-opaque calculi or other significant radiographic abnormality are seen. Excreted contrast in the renal collecting systems, ureters, and bladder. Moderate right and mild left hydroureteronephrosis. No acute osseous abnormality.  IMPRESSION: 1. No obstruction. 2. Moderate right and mild left hydroureteronephrosis.   Electronically Signed   By: Obie Dredge M.D.   On: 03/24/2020 16:42   Narrative & Impression  CLINICAL DATA:  Acute kidney injury.  Elevated BUN and  creatinine.  EXAM: RENAL / URINARY TRACT ULTRASOUND COMPLETE  COMPARISON:  None.  FINDINGS: Right Kidney:  Renal measurements: 9.7 x 4.7 x 4.2 cm. = volume: 97 mL. Normal echogenicity. Upper pole exophytic cyst measures 3.5 x 3.2 x 3.6 cm. No hydronephrosis  Left Kidney:  Renal measurements: 9.7 x 5.5 x 4.8 cm = volume: 132.8 mL. Normal echogenicity. No mass or hydronephrosis. Inferior pole cyst measures 1.8 x 1.7 x 1.6 cm.  Bladder:  Collapsed urinary bladder is not visualized.  Other:  None.  IMPRESSION: 1. No acute abnormality. 2. Asymmetric right atrophy. 3. Bilateral kidney cysts.   Electronically Signed   By: Signa Kell M.D.   On: 03/24/2020 22:54      Impression/Recommendation:  Hematuria--hemorrhagic cystitis in pt on multiple anticoagulation meds.  Urine in tubing appears to be clearing.  Continue Rocephin and follow up sensitivities for Klebsiella. Anticoagulation per primary team.  Recommend exchange foley in light of back to back infections. Hand irrigate if UO decreases.  Mild hydro on KUB not significant and per RUS appears  to have resolved (likely after foley was placed).   Debbrah Alar PA-C 04/07/2020, 3:25 PM

## 2020-04-08 DIAGNOSIS — R310 Gross hematuria: Secondary | ICD-10-CM

## 2020-04-08 LAB — HEPATIC FUNCTION PANEL
ALT: 58 U/L — ABNORMAL HIGH (ref 0–44)
AST: 44 U/L — ABNORMAL HIGH (ref 15–41)
Albumin: 2.5 g/dL — ABNORMAL LOW (ref 3.5–5.0)
Alkaline Phosphatase: 81 U/L (ref 38–126)
Bilirubin, Direct: 0.2 mg/dL (ref 0.0–0.2)
Indirect Bilirubin: 0.1 mg/dL — ABNORMAL LOW (ref 0.3–0.9)
Total Bilirubin: 0.3 mg/dL (ref 0.3–1.2)
Total Protein: 5.6 g/dL — ABNORMAL LOW (ref 6.5–8.1)

## 2020-04-08 LAB — BASIC METABOLIC PANEL
Anion gap: 9 (ref 5–15)
BUN: 22 mg/dL (ref 8–23)
CO2: 20 mmol/L — ABNORMAL LOW (ref 22–32)
Calcium: 8.6 mg/dL — ABNORMAL LOW (ref 8.9–10.3)
Chloride: 112 mmol/L — ABNORMAL HIGH (ref 98–111)
Creatinine, Ser: 1.22 mg/dL — ABNORMAL HIGH (ref 0.44–1.00)
GFR calc Af Amer: 51 mL/min — ABNORMAL LOW (ref >60)
GFR calc non Af Amer: 44 mL/min — ABNORMAL LOW (ref >60)
Glucose, Bld: 120 mg/dL — ABNORMAL HIGH (ref 70–99)
Potassium: 3.8 mmol/L (ref 3.5–5.1)
Sodium: 141 mmol/L (ref 135–145)

## 2020-04-08 LAB — CBC
HCT: 31.5 % — ABNORMAL LOW (ref 36.0–46.0)
Hemoglobin: 10.1 g/dL — ABNORMAL LOW (ref 12.0–15.0)
MCH: 29.5 pg (ref 26.0–34.0)
MCHC: 32.1 g/dL (ref 30.0–36.0)
MCV: 92.1 fL (ref 80.0–100.0)
Platelets: 366 10*3/uL (ref 150–400)
RBC: 3.42 MIL/uL — ABNORMAL LOW (ref 3.87–5.11)
RDW: 14.4 % (ref 11.5–15.5)
WBC: 11.1 10*3/uL — ABNORMAL HIGH (ref 4.0–10.5)
nRBC: 0 % (ref 0.0–0.2)

## 2020-04-08 LAB — GLUCOSE, CAPILLARY
Glucose-Capillary: 117 mg/dL — ABNORMAL HIGH (ref 70–99)
Glucose-Capillary: 231 mg/dL — ABNORMAL HIGH (ref 70–99)
Glucose-Capillary: 234 mg/dL — ABNORMAL HIGH (ref 70–99)
Glucose-Capillary: 160 mg/dL — ABNORMAL HIGH (ref 70–99)

## 2020-04-08 LAB — URINE CULTURE: Culture: >=100000 — AB

## 2020-04-08 MED ORDER — INSULIN ASPART 100 UNIT/ML ~~LOC~~ SOLN
0.0000 [IU] | Freq: Three times a day (TID) | SUBCUTANEOUS | Status: DC
  Administered 2020-04-08 – 2020-04-09 (×3): 3 [IU] via SUBCUTANEOUS
  Administered 2020-04-09: 5 [IU] via SUBCUTANEOUS
  Administered 2020-04-10 (×2): 2 [IU] via SUBCUTANEOUS
  Administered 2020-04-11 (×2): 3 [IU] via SUBCUTANEOUS
  Administered 2020-04-11: 2 [IU] via SUBCUTANEOUS

## 2020-04-08 MED ORDER — INSULIN ASPART 100 UNIT/ML ~~LOC~~ SOLN
0.0000 [IU] | Freq: Every day | SUBCUTANEOUS | Status: DC
  Administered 2020-04-09: 3 [IU] via SUBCUTANEOUS

## 2020-04-08 MED ORDER — CEFAZOLIN SODIUM-DEXTROSE 1-4 GM/50ML-% IV SOLN
1.0000 g | Freq: Three times a day (TID) | INTRAVENOUS | Status: DC
  Administered 2020-04-08 – 2020-04-11 (×11): 1 g via INTRAVENOUS
  Filled 2020-04-08 (×12): qty 50

## 2020-04-08 NOTE — Progress Notes (Signed)
Occupational Therapy Treatment Patient Details [name] MRN: [id number] DOB: [birth year] Today's Date: 04/08/2020    History of present illness 74 y.o female admitted with sudden onset Lt sided weakness and surred speech.  Tpa was administered.  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 pt extubated 6/14, but had increasing lethsrgy > possibly septic due to UTI.  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  PMH includes: hypertensive retinopathy, DM, arthritis, s/p TKA, s/p ORIF Rt hip, s/p ORIF LT hip.   OT comments  Pt seen in conjunction with PT to maximize participation and activity tolerance with pt able to stand to stedy with total A +2. Pt communicating more with therapists able to follow all commands appropriately. Pt accurately stated her husband and daughters name and able to sit EOB with min guard- MINA. Pt continues to present with L sided weakness, decreased activity tolerance, cognitive impairments, and impaired balance impacting pts ability to engage in BADLs. Continue to recommend CIR, will follow.   Follow Up Recommendations  CIR    Equipment Recommendations  Wheelchair (measurements OT);Wheelchair cushion (measurements OT);Hospital bed    Recommendations for Other Services      Precautions / Restrictions Precautions Precautions: Fall Precaution Comments: L hemi/neglect Restrictions Weight Bearing Restrictions: No       Mobility Bed Mobility Overal bed mobility: Needs Assistance Bed Mobility: Supine to Sit;Rolling Rolling: Mod assist;Max assist;+2 for physical assistance   Supine to sit: Max assist;+2 for physical assistance;HOB elevated     General bed mobility comments: pt required mulitmodal cues to sequence bed mobility and able to intiate bringing RLE to EOB; MAX A +2 to elevate trunk with HOB elevated although pt assisting with RUE.  MAX A +2 to roll R<>L and maintain sidelying position  Transfers Overall transfer level: Needs assistance Equipment used: Ambulation equipment used Transfers: Sit to/from Stand Sit to Stand: +2 physical assistance;Total assist;From elevated surface         General transfer comment: pt able to stand to stedy with total A +2 needing assist with bed pad to bring hips into extension. pt requires assist to manage LUE on stedy but able to pull into standing with RUE    Balance Overall balance assessment: Needs assistance Sitting-balance support: Single extremity supported;Feet supported Sitting balance-Leahy Scale: Poor Sitting balance - Comments: pt requires at least mIn - min guard assist for sitting balance with RUE supported. noted improved righting reactions with able to intiate self correct posture Postural control: Left lateral lean;Posterior lean Standing balance support: Bilateral upper extremity supported Standing balance-Leahy Scale: Poor Standing balance comment: reliant on external assist                           ADL either performed or assessed with clinical judgement   ADL Overall ADL's : Needs assistance/impaired                         Toilet Transfer: Maximal assistance;+2 for physical assistance;+2 for safety/equipment Toilet Transfer Details (indicate cue type and reason): sit<>stand to stedy Toileting- Clothing Manipulation and Hygiene: Bed level;Total assistance Toileting - Clothing Manipulation Details (indicate cue type and reason): for posterior pericare post BM     Functional mobility during ADLs: Maximal assistance;Total assistance;+2 for physical assistance (sit<>stand to stedy) General ADL Comments: pt continues to present with with  cognitive impairments, L sided weakness, decreased activity tolerance, and impaired balance. However pt with increased movement in LUE ( trace at elbow) and able to follow commands. Communicating more during  session     Vision Baseline Vision/History: Wears glasses Wears Glasses: At all times Patient Visual Report: Other (comment) (continues to present with L neglect; needs tactile cues to track to midline) Vision Assessment?: Vision impaired- to be further tested in functional context;Yes   Perception     Praxis      Cognition Arousal/Alertness: Awake/alert Behavior During Therapy: Flat affect (but appropriate) Overall Cognitive Status: Impaired/Different from baseline Area of Impairment: Attention;Following commands;Safety/judgement;Problem solving                   Current Attention Level: Sustained   Following Commands: Follows one step commands with increased time;Follows one step commands consistently Safety/Judgement: Decreased awareness of deficits   Problem Solving: Slow processing;Requires verbal cues;Decreased initiation;Difficulty sequencing;Requires tactile cues General Comments: pt following all commands with increased time. able to communicate with therapists with simple statements.        Exercises Other Exercises Other Exercises: pt able to intiiate self ROM with LUE; noted trace in elbow flexion.   Shoulder Instructions       General Comments      Pertinent Vitals/ Pain       Pain Assessment: No/denies pain  Home Living                                          Prior Functioning/Environment              Frequency  Min 2X/week        Progress Toward Goals  OT Goals(current goals can now be found in the care plan section)  Progress towards OT goals: Progressing toward goals  Acute Rehab OT Goals Patient Stated Goal: go home - per daughter OT Goal Formulation: With patient Time For Goal Achievement: 04/09/20 Potential to Achieve Goals: Good  Plan Discharge plan remains appropriate;Frequency remains appropriate    Co-evaluation      Reason for Co-Treatment: Complexity of the patient's impairments (multi-system  involvement);For patient/therapist safety;To address functional/ADL transfers;Necessary to address cognition/behavior during functional activity   OT goals addressed during session: ADL's and self-care      AM-PAC OT "6 Clicks" Daily Activity     Outcome Measure   Help from another person eating meals?: Total Help from another person taking care of personal grooming?: A Lot Help from another person toileting, which includes using toliet, bedpan, or urinal?: Total Help from another person bathing (including washing, rinsing, drying)?: Total Help from another person to put on and taking off regular upper body clothing?: A Lot Help from another person to put on and taking off regular lower body clothing?: Total 6 Click Score: 8    End of Session Equipment Utilized During Treatment: Gait belt;Other (comment) (stedy)  OT Visit Diagnosis: Unsteadiness on feet (R26.81);Other abnormalities of gait and mobility (R26.89);Other symptoms and signs involving the nervous system (R29.898);Other symptoms and signs involving cognitive function;Hemiplegia and hemiparesis Hemiplegia - Right/Left: Left Hemiplegia - dominant/non-dominant: Non-Dominant Hemiplegia - caused by: Cerebral infarction   Activity Tolerance Patient tolerated treatment well   Patient Left in chair;with call bell/phone within reach;with chair alarm set;Other (comment) (lift pad behind pt)   Nurse Communication Mobility status;Need for lift equipment  Time: 0802-0850 OT Time Calculation (min): 48 min  Charges: OT General Charges $OT Visit: 1 Visit OT Treatments $Self Care/Home Management : 8-22 mins $Therapeutic Activity: 8-22 mins  Audery Amel., COTA/L Acute Rehabilitation Services [phone]    Angelina Pih 04/08/2020, 10:09 AM

## 2020-04-08 NOTE — Progress Notes (Signed)
Pt placed on cpap for the night. RT will continue to monitor as needed. 

## 2020-04-08 NOTE — Progress Notes (Addendum)
STROKE TEAM PROGRESS NOTE   INTERVAL HISTORY No family at bedside. Pt reclining in bed, comfortable, smiling to provider. Still has right hand repetitive volunteer movement, able to suppress by will. PT/OT recommend CIR but declined by insurance, daughter requested to appeal.   Vitals:   04/07/20 2350 04/08/20 0353 04/08/20 0500 04/08/20 0757  BP: (!) 143/84 (!) 156/84  (!) 149/90  Pulse: 61 62  70  Resp: 16 16  18   Temp: 98.5 F (36.9 C) 98.2 F (36.8 C)  97.8 F (36.6 C)  TempSrc:  Oral  Oral  SpO2: 100% 100%  97%  Weight:   79.2 kg    CBC:  Recent Labs  Lab 04/07/20 0605 04/08/20 0340  WBC 12.5* 11.1*  HGB 10.4* 10.1*  HCT 32.4* 31.5*  MCV 92.8 92.1  PLT 384 366   Basic Metabolic Panel:  Recent Labs  Lab 04/07/20 0605 04/08/20 0340  NA 142 141  K 3.9 3.8  CL 113* 112*  CO2 19* 20*  GLUCOSE 104* 120*  BUN 30* 22  CREATININE 1.30* 1.22*  CALCIUM 8.7* 8.6*    IMAGING past 24 hours No results found.  PHYSICAL EXAM   Temp:  [97.5 F (36.4 C)-98.5 F (36.9 C)] 97.8 F (36.6 C) (06/29 0757) Pulse Rate:  [61-76] 70 (06/29 0757) Resp:  [16-20] 18 (06/29 0757) BP: (143-170)/(74-93) 149/90 (06/29 0757) SpO2:  [97 %-100 %] 97 % (06/29 0757) Weight:  [79.2 kg] 79.2 kg (06/29 0500)  General - Well nourished, well developed, awake alert, no distress  Ophthalmologic - fundi not visualized due to noncooperation.  Cardiovascular - Regular rhythm and rate.  Neuro - patient is awake alert, eyes open, orientated to place, age, self but not to time. Mild dysarthria, follows simple commands. Left neglect. Eyes right gaze preference, not crossing midline. PERRL. Not blinking to visual threat on the left. Left facial droop. Tongue protrusion midline. LUE mild withdraw to pain. LLE 1/5 withdraw on pain stimulation. RUE spontaneous against gravity and purposeful. RLE 3/5 on pain stimulation. Sensation subjectively symmetrical, coordination intact left FTN and gait not  tested.   ASSESSMENT/PLAN Meredith Shaw is a 74 y.o. female with history of HTN, HLD, DB presenting with slurred speech, L sided weakness and ataxia. Received IV tPA 03/23/2020 at 2252. Worsening in ED. CTA confirmed distal R M1 occlusion. Taken to IR.    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 ASPECTS 10.     CTA head & neck distal R M2 occlusion w/ moderate collateral flow R MCA territory.   Cerebral angio angio w/ TICI3 revascularization R M1 occlusion   Post IR CT no ICH or mass effect   CT head 6/14 0234 no acute abnormality. Small vessel disease.   CT head 6/14 1232 R MCA territory infarct sparing basal ganglia  CT head 6/14 2159 progressive R MCA territory infarct w/ basal ganglia sparing. Hyperdense R M2  MRI  Large R MCA territory infarct w/ petechial hemorrhage. Scattered small R PCA territory infarcts. Punctate poster L periventricular WM infarct. Old B basal ganglia and thalamic lacunes.   MRA  Reocclusion distal R M1  EEG R frontal sharp waves, generalized and lateralized R brain slowing  LE Doppler  No DVT  2D Echo EF 70-75%. No obvious source of embolus. ?Echodensity L side of MV  TEE normal  MV, EF 70-75%, no PFO  Plan loop recorder before d/c   TG >500->247, Direct LDL 63.8  HgbA1c 8.9  Heparin 5000 units sq tid for VTE prophylaxis  aspirin 81 mg daily prior to admission, was on ASA 325 & Plavix 75 DAPT -> now ASA 81 due to hematuria   Therapy recommendations:  CIR but auth not received from insurance ->husband agrees to SNF  Disposition:  pending   Acute Respiratory Failure, resolved  Intubated for IR, left intubated d/t slow response to reversal and involuntary flapping of RUE   Extubated 6/14 followed by coffee ground emesis   Bronchial hygeine  Was on nasal trumpet -> now  off  Tolerating extubation well now  On room air  Leukocytosis   KUB w/o ileus, + hydroureteronephrosis -> outpt urology follow up  6/14 UA w/ WBC > 50 & UCx E coli  BCx no growth   Cefepime 6/14>>6/15  Meropenem 6/15>>6/17  Keflex 6/17>>6/21  Leukocytosis, WBC 11.1->15.6->23.2 ->18.3->12.5->11.1   6/26 U/A w/ WBC > 50   Urine culture 6/26 >100,000 colonies E coli  Rocephin restarted 6/26>>6/29  Deescalated to ancef 6/29>>  Hematuria   Gross hematuria x 2-3 days  Earlier this admission she has hydroureteronephrosis  Now has recurrent UTI  Has foley catheter, replaced 6/28  Hb 12.4->11.7->11.0->10.4->10.1  ASA 325->81 and plavix on hold  Urology consulted Annabell Howells) - appreciate help - hgb drift not related to hematuria. Needs OP f/u when more recovered.  Exchange foley catheter x 1 per urology  AKI  Creatinine 1.12-1.51-1.66->1.71->1.30->1.22  continue IV fluid 75 cc/h  Encourage p.o. intake - pt is drinking  D/c lisinopril   BMP monitoring  Hypertension  Home meds:  amlodipine 5, lisinopril 20, metoprolol 100 bid  . On Cleviprex -> off  . BP stable  . On amlodipine 10, metoprolol 25 bid . Lisinopril d/c'ed due to AKI . Long-term BP goal 130-150 given right MCA reocclusion  Hyperlipidemia  Home meds:  pravachol 40  TG >500->247, Direct LDL 63.8, goal < 70  pravastatin 40 -> on hold due to elevated LFTs  On zetia 10  AST/ALT 44/72 -> 44/58  Resume pravastatin once LFT normalized  Diabetes type II Uncontrolled  Home meds:  trulicity 0.75, glimepiride 1, metformin 500 bid, ozempic 0.25-0.5  HgbA1c 8.9, goal < 7.0  CBGs  SSI 0-9  Diabetic coordinator following  On Levemir to 10u Bid    Close PCP follow up for better DM control  Dysphagia . Secondary to stroke . Now on Dys 1 and honey thick -> eating well so far . Cortrak placed -> removed 6/26 . On IVF  . Speech on board   Other Stroke Risk Factors  Advanced  age  Family hx stroke (father)  Other Active Problems  Hypokalemia 3.4 - supplement - 4.6->4.0->3.9->3.8  Lethargy - on amantadine  Hospital day # 15    Marvel Plan, MD PhD Stroke Neurology 04/08/2020 10:51 AM  To contact Stroke Continuity provider, please refer to [email or web address]. After hours, contact General Neurology

## 2020-04-08 NOTE — Progress Notes (Signed)
13 Days Post-Op  Subjective: CC: Hematuria.  Hx:  The urine is clearing on antibiotics.    Cr is declining.  Hgb slighly lower.  ROS:  Review of Systems  Unable to perform ROS: Medical condition    Anti-infectives: Anti-infectives (From admission, onward)   Start     Dose/Rate Route Frequency Ordered Stop   04/05/20 1530  cefTRIAXone (ROCEPHIN) 1 g in sodium chloride 0.9 % 100 mL IVPB     Discontinue     1 g 200 mL/hr over 30 Minutes Intravenous Every 24 hours 04/05/20 1512 04/12/20 1529   03/28/20 0000  cephALEXin (KEFLEX) capsule 500 mg  Status:  Discontinued        500 mg Per Tube Every 6 hours 03/27/20 1406 03/27/20 1434   03/27/20 1445  cephALEXin (KEFLEX) capsule 500 mg  Status:  Discontinued        500 mg Per Tube Every 12 hours 03/27/20 1434 03/31/20 1257   03/25/20 0200  meropenem (MERREM) 1 g in sodium chloride 0.9 % 100 mL IVPB  Status:  Discontinued        1 g 200 mL/hr over 30 Minutes Intravenous Every 12 hours 03/25/20 0139 03/27/20 1406   03/24/20 1915  ceFEPIme (MAXIPIME) 2 g in sodium chloride 0.9 % 100 mL IVPB  Status:  Discontinued        2 g 200 mL/hr over 30 Minutes Intravenous Every 24 hours 03/24/20 1907 03/25/20 0139   03/24/20 0006  vancomycin (VANCOCIN) 1-5 GM/200ML-% IVPB       Note to Pharmacy: Channing Mutters   : cabinet override      03/24/20 0006 03/24/20 1214      Current Facility-Administered Medications  Medication Dose Route Frequency Provider Last Rate Last Admin  . 0.9 %  sodium chloride infusion   Intravenous Continuous Marvel Plan, MD 75 mL/hr at 04/08/20 0600 Rate Verify at 04/08/20 0600  . acetaminophen (TYLENOL) tablet 650 mg  650 mg Oral Q4H PRN Lars Masson, MD       Or  . acetaminophen (TYLENOL) 160 MG/5ML solution 650 mg  650 mg Per Tube Q4H PRN Lars Masson, MD   650 mg at 03/29/20 1409   Or  . acetaminophen (TYLENOL) suppository 650 mg  650 mg Rectal Q4H PRN Lars Masson, MD   650 mg at 03/25/20 0753  .  amantadine (SYMMETREL) 50 MG/5ML solution 150 mg  150 mg Oral Daily Marvel Plan, MD   150 mg at 04/08/20 0550  . amLODipine (NORVASC) tablet 10 mg  10 mg Oral Daily Marvel Plan, MD   10 mg at 04/07/20 0837  . aspirin EC tablet 81 mg  81 mg Oral Daily Marvel Plan, MD   81 mg at 04/07/20 0837  . bisacodyl (DULCOLAX) EC tablet 5 mg  5 mg Oral Daily Marvel Plan, MD   5 mg at 04/07/20 0836  . cefTRIAXone (ROCEPHIN) 1 g in sodium chloride 0.9 % 100 mL IVPB  1 g Intravenous Q24H Rinehuls, Kinnie Scales, PA-C   Stopped at 04/07/20 1613  . Chlorhexidine Gluconate Cloth 2 % PADS 6 each  6 each Topical Daily Lars Masson, MD   6 each at 04/07/20 [phone]  . ezetimibe (ZETIA) tablet 10 mg  10 mg Oral Daily Marvel Plan, MD   10 mg at 04/07/20 0836  . heparin injection 5,000 Units  5,000 Units Subcutaneous Q8H Marvel Plan, MD   5,000 Units at 04/08/20 0550  . insulin aspart (  novoLOG) injection 0-20 Units  0-20 Units Subcutaneous TID WC & HS Marvel Plan, MD   4 Units at 04/07/20 1735  . insulin detemir (LEVEMIR) injection 10 Units  10 Units Subcutaneous BID Layne Benton, NP   10 Units at 04/07/20 2211  . MEDLINE mouth rinse  15 mL Mouth Rinse BID Lars Masson, MD   15 mL at 04/07/20 2211  . metoprolol tartrate (LOPRESSOR) injection 2.5-5 mg  2.5-5 mg Intravenous Q3H PRN Lars Masson, MD   2.5 mg at 03/28/20 0143  . metoprolol tartrate (LOPRESSOR) tablet 25 mg  25 mg Oral BID Marvel Plan, MD   25 mg at 04/07/20 2210  . multivitamin with minerals tablet 1 tablet  1 tablet Oral Daily Micki Riley, MD   1 tablet at 04/07/20 0835  . ondansetron (ZOFRAN) injection 4 mg  4 mg Intravenous Q6H PRN Lars Masson, MD      . pantoprazole (PROTONIX) EC tablet 40 mg  40 mg Oral Daily Marvel Plan, MD   40 mg at 04/07/20 0836  . polyethylene glycol (MIRALAX / GLYCOLAX) packet 17 g  17 g Oral Daily Marvel Plan, MD   17 g at 04/07/20 0836  . Resource ThickenUp Clear   Oral PRN Micki Riley, MD      .  senna-docusate (Senokot-S) tablet 1 tablet  1 tablet Oral QHS PRN Marvel Plan, MD         Objective: Vital signs in last 24 hours: Temp:  [97.5 F (36.4 C)-98.5 F (36.9 C)] 98.2 F (36.8 C) (06/29 0353) Pulse Rate:  [61-76] 62 (06/29 0353) Resp:  [16-20] 16 (06/29 0353) BP: (143-170)/(74-93) 156/84 (06/29 0353) SpO2:  [98 %-100 %] 100 % (06/29 0353) Weight:  [79.2 kg] 79.2 kg (06/29 0500)  Intake/Output from previous day: 06/28 0701 - 06/29 0700 In: 4137.9 [P.O.:30; I.V.:3907.9; IV Piggyback:200] Out: 2250 [Urine:2250] Intake/Output this shift: Total I/O In: 4107.9 [I.V.:3907.9; IV Piggyback:200] Out: 950 [Urine:950]   Physical Exam Vitals reviewed.  Genitourinary:    Comments: Urine very light pink in tubing.     Lab Results:  Recent Labs    04/07/20 0605 04/08/20 0340  WBC 12.5* 11.1*  HGB 10.4* 10.1*  HCT 32.4* 31.5*  PLT 384 366   BMET Recent Labs    04/07/20 0605 04/08/20 0340  NA 142 141  K 3.9 3.8  CL 113* 112*  CO2 19* 20*  GLUCOSE 104* 120*  BUN 30* 22  CREATININE 1.30* 1.22*  CALCIUM 8.7* 8.6*   PT/INR No results for input(s): LABPROT, INR in the last 72 hours. ABG No results for input(s): PHART, HCO3 in the last 72 hours.  Invalid input(s): PCO2, PO2  Studies/Results: No results found.   Assessment and Plan: Hemorrhagic cystitis.   The urine is gradually clearing.  Her Hgb is drifting down but there is not enough hematuria to account for that finding.  Her creatinine continues to improve.     I will sign off.   She will need f/u in my office when more fully recovered.        LOS: 15 days    Bjorn Pippin 04/08/2020 [phone] ID: [name], female   DOB: 12/04/1945, 74 y.o.   MRN: [id number]

## 2020-04-08 NOTE — Progress Notes (Addendum)
Inpatient Rehabilitation Admissions Coordinator  We have received a denial for CIR admit from insurance. Daughter, [name] has requested expedited appeal per Estill Dooms yesterday. I have begun appeal. I will follow up once determination on appeal has been made.  Ottie Glazier, RN, MSN Rehab Admissions Coordinator [phone] 04/08/2020 11:05 AM

## 2020-04-08 NOTE — Progress Notes (Signed)
Physical Therapy Treatment Patient Details [name] MRN: [id number] DOB: 01-08-1946 Today's Date: 04/08/2020    History of Present Illness 74 y.o female admitted with sudden onset Lt sided weakness and surred speech.  Tpa was administered.  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 pt extubated 6/14, but had increasing lethsrgy > possibly septic due to UTI.  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  PMH includes: hypertensive retinopathy, DM, arthritis, s/p TKA, s/p ORIF Rt hip, s/p ORIF LT hip.    PT Comments    Patient is making progress toward PT goals and tolerated mobility well.  This session focused on sitting balance and functional transfer training using Stedy standing frame. Pt continues to require +2 assist for all mobility however follows commands more consistently and recognizing L side with cues. Continue to recommend CIR for further skilled PT services to maximize independence and safety with mobility.     Follow Up Recommendations  CIR     Equipment Recommendations  Hospital bed;Other (comment) (mechanical lift if d/c home, other equipment TBD)    Recommendations for Other Services Rehab consult     Precautions / Restrictions Precautions Precautions: Fall Precaution Comments: L hemi and neglect (getting closer to inattention vs neglect) Restrictions Weight Bearing Restrictions: No    Mobility  Bed Mobility Overal bed mobility: Needs Assistance Bed Mobility: Supine to Sit;Rolling Rolling: Mod assist;Max assist;+2 for physical assistance   Supine to sit: Max assist;+2 for physical assistance;HOB elevated     General bed mobility comments: pt required mulitmodal cues to sequence bed mobility and able to intiate bringing RLE to EOB; MAX A +2 to elevate trunk with HOB elevated although pt assisting with RUE. MAX A +2 to roll R<>L and  maintain sidelying position  Transfers Overall transfer level: Needs assistance Equipment used: Ambulation equipment used Transfers: Sit to/from Stand Sit to Stand: +2 physical assistance;Total assist;From elevated surface         General transfer comment: pt able to stand to stedy with total A +2 needing assist with bed pad to bring hips into extension. pt requires assist to manage LUE on stedy but able to pull into standing with RUE  Ambulation/Gait                 Stairs             Wheelchair Mobility    Modified Rankin (Stroke Patients Only) Modified Rankin (Stroke Patients Only) Pre-Morbid Rankin Score: No symptoms Modified Rankin: Severe disability     Balance Overall balance assessment: Needs assistance Sitting-balance support: Single extremity supported;Feet supported Sitting balance-Leahy Scale: Poor Sitting balance - Comments: pt requires at least mIn - min guard assist for sitting balance with RUE supported. noted improved righting reactions with able to intiate self correct posture Postural control: Left lateral lean;Posterior lean Standing balance support: Bilateral upper extremity supported Standing balance-Leahy Scale: Poor Standing balance comment: reliant on external assist                            Cognition Arousal/Alertness: Awake/alert Behavior During Therapy: Flat affect (but appropriate) Overall Cognitive Status: Impaired/Different from baseline Area of Impairment: Attention;Following commands;Safety/judgement;Problem solving                   Current Attention Level: Sustained Memory: Decreased short-term memory Following Commands: Follows one step  commands with increased time;Follows one step commands consistently Safety/Judgement: Decreased awareness of deficits Awareness: Emergent Problem Solving: Slow processing;Requires verbal cues;Decreased initiation;Difficulty sequencing;Requires tactile cues General  Comments: pt following all commands with increased time. able to communicate with therapists with simple statements.      Exercises Other Exercises Other Exercises: pt able to intiiate self ROM with LUE; noted trace in elbow flexion.    General Comments        Pertinent Vitals/Pain Pain Assessment: No/denies pain    Home Living                      Prior Function            PT Goals (current goals can now be found in the care plan section) Acute Rehab PT Goals Patient Stated Goal: go home - per daughter Progress towards PT goals: Progressing toward goals    Frequency    Min 4X/week      PT Plan Current plan remains appropriate    Co-evaluation PT/OT/SLP Co-Evaluation/Treatment: Yes Reason for Co-Treatment: Complexity of the patient's impairments (multi-system involvement);Necessary to address cognition/behavior during functional activity;For patient/therapist safety;To address functional/ADL transfers PT goals addressed during session: Mobility/safety with mobility;Balance OT goals addressed during session: ADL's and self-care      AM-PAC PT "6 Clicks" Mobility   Outcome Measure  Help needed turning from your back to your side while in a flat bed without using bedrails?: A Lot Help needed moving from lying on your back to sitting on the side of a flat bed without using bedrails?: Total Help needed moving to and from a bed to a chair (including a wheelchair)?: Total Help needed standing up from a chair using your arms (e.g., wheelchair or bedside chair)?: A Lot Help needed to walk in hospital room?: Total Help needed climbing 3-5 steps with a railing? : Total 6 Click Score: 8    End of Session Equipment Utilized During Treatment: Gait belt Activity Tolerance: Patient tolerated treatment well Patient left: with call bell/phone within reach;in chair;with chair alarm set Nurse Communication: Mobility status;Need for lift equipment PT Visit Diagnosis:  Other abnormalities of gait and mobility (R26.89);Muscle weakness (generalized) (M62.81);History of falling (Z91.81);Other symptoms and signs involving the nervous system (R29.898)     Time: 5400-8676 PT Time Calculation (min) (ACUTE ONLY): 50 min  Charges:  $Neuromuscular Re-education: 8-22 mins                     Erline Levine, PTA Acute Rehabilitation Services Pager: [phone] Office: [phone]     Carolynne Edouard 04/08/2020, 10:38 AM

## 2020-04-08 NOTE — Progress Notes (Signed)
Inpatient Rehab Admissions:  Received a denial from [facility] [employer] for CIR authorization.  Family would like to pursue expedited appeal, which we will start tomorrow.    Signed: Estill Dooms, PT, DPT Admissions Coordinator [phone] 04/08/20  4:34 PM

## 2020-04-08 NOTE — Progress Notes (Signed)
Inpatient Diabetes Program Recommendations  AACE/ADA: New Consensus Statement on Inpatient Glycemic Control (2015)  Target Ranges:  Prepandial:   less than 140 mg/dL      Peak postprandial:   less than 180 mg/dL (1-2 hours)      Critically ill patients:  140 - 180 mg/dL   Lab Results  Component Value Date   GLUCAP 117 (H) 04/08/2020   HGBA1C 8.9 (H) 03/24/2020    Review of Glycemic Control Results for MEGAN, PRESTI (MRN [id number]) as of 04/08/2020 07:40  Ref. Range 04/07/2020 11:26 04/07/2020 16:39 04/07/2020 22:09 04/07/2020 22:42 04/08/2020 06:18  Glucose-Capillary Latest Ref Range: 70 - 99 mg/dL 341 (H) 937 (H) 67 (L) 103 (H) 117 (H)   Diabetes history: DM 2 Outpatient Diabetes medications:  Trulicity 0.75 once week, Amaryl 1 mg daily, Metformin 500 mg bid Current orders for Inpatient glycemic control:  Novolog resistant tid with meals and HS Levemir 10 units bid  Inpatient Diabetes Program Recommendations:    Please reduce Novolog correction to sensitive.    THanks  Beryl Meager, RN, BC-ADM Inpatient Diabetes Coordinator Pager [phone] (8a-5p)

## 2020-04-09 DIAGNOSIS — N3001 Acute cystitis with hematuria: Secondary | ICD-10-CM

## 2020-04-09 LAB — BASIC METABOLIC PANEL
Anion gap: 9 (ref 5–15)
BUN: 18 mg/dL (ref 8–23)
CO2: 21 mmol/L — ABNORMAL LOW (ref 22–32)
Calcium: 8.7 mg/dL — ABNORMAL LOW (ref 8.9–10.3)
Chloride: 111 mmol/L (ref 98–111)
Creatinine, Ser: 1.17 mg/dL — ABNORMAL HIGH (ref 0.44–1.00)
GFR calc Af Amer: 54 mL/min — ABNORMAL LOW (ref >60)
GFR calc non Af Amer: 46 mL/min — ABNORMAL LOW (ref >60)
Glucose, Bld: 108 mg/dL — ABNORMAL HIGH (ref 70–99)
Potassium: 4.3 mmol/L (ref 3.5–5.1)
Sodium: 141 mmol/L (ref 135–145)

## 2020-04-09 LAB — GLUCOSE, CAPILLARY
Glucose-Capillary: 108 mg/dL — ABNORMAL HIGH (ref 70–99)
Glucose-Capillary: 267 mg/dL — ABNORMAL HIGH (ref 70–99)
Glucose-Capillary: 216 mg/dL — ABNORMAL HIGH (ref 70–99)
Glucose-Capillary: 259 mg/dL — ABNORMAL HIGH (ref 70–99)

## 2020-04-09 LAB — CBC
HCT: 34.6 % — ABNORMAL LOW (ref 36.0–46.0)
Hemoglobin: 11.3 g/dL — ABNORMAL LOW (ref 12.0–15.0)
MCH: 30.2 pg (ref 26.0–34.0)
MCHC: 32.7 g/dL (ref 30.0–36.0)
MCV: 92.5 fL (ref 80.0–100.0)
Platelets: 409 10*3/uL — ABNORMAL HIGH (ref 150–400)
RBC: 3.74 MIL/uL — ABNORMAL LOW (ref 3.87–5.11)
RDW: 14.6 % (ref 11.5–15.5)
WBC: 10.8 10*3/uL — ABNORMAL HIGH (ref 4.0–10.5)
nRBC: 0 % (ref 0.0–0.2)

## 2020-04-09 MED ORDER — SODIUM CHLORIDE 0.9 % IV SOLN
INTRAVENOUS | Status: DC | PRN
  Administered 2020-04-09: 250 mL via INTRAVENOUS

## 2020-04-09 MED ORDER — SODIUM CHLORIDE 0.9 % IR SOLN
1000.0000 mL | Freq: Once | Status: AC
  Administered 2020-04-09: 1000 mL

## 2020-04-09 MED ORDER — SODIUM CHLORIDE 0.9 % IR SOLN: 1000.0000 mL | Freq: Once | Status: DC

## 2020-04-09 NOTE — Progress Notes (Signed)
Physical Therapy Treatment Patient Details [name] MRN: [id number] DOB: 1946-04-12 Today's Date: 04/09/2020    History of Present Illness 74 y.o female admitted with sudden onset Lt sided weakness and surred speech.  Tpa was administered.  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 pt extubated 6/14, but had increasing lethsrgy > possibly septic due to UTI.  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  PMH includes: hypertensive retinopathy, DM, arthritis, s/p TKA, s/p ORIF Rt hip, s/p ORIF LT hip.    PT Comments    Patient continues to make progress toward PT goals. Pt with improving sitting balance without UE support, visually tracking to midline, and with improved communication this session.  Continue to recommend CIR for further skilled PT services to maximize safety with mobility and decrease caregiver burden.    Follow Up Recommendations  CIR     Equipment Recommendations  Hospital bed;Other (comment) (mechanical lift if d/c home, other equipment TBD)    Recommendations for Other Services Rehab consult     Precautions / Restrictions Precautions Precautions: Fall Precaution Comments: L hemi and neglect (getting closer to inattention vs neglect)    Mobility  Bed Mobility Overal bed mobility: Needs Assistance Bed Mobility: Rolling;Sidelying to Sit Rolling: Mod assist;Max assist Sidelying to sit: Max assist       General bed mobility comments: mod A to roll toward L side and max A toward R side; assist to bring bilat LE to EOB and to elevate trunk into sitting; use of bed pad and multimodal cues   Transfers Overall transfer level: Needs assistance   Transfers: Lateral/Scoot Transfers          Lateral/Scoot Transfers: Max assist;+2 physical assistance General transfer comment: cues for sequencing, anterior lean, and hand placement; bed pad  utilized for hip extension   Ambulation/Gait                 Stairs             Wheelchair Mobility    Modified Rankin (Stroke Patients Only) Modified Rankin (Stroke Patients Only) Pre-Morbid Rankin Score: No symptoms Modified Rankin: Severe disability     Balance Overall balance assessment: Needs assistance Sitting-balance support: Single extremity supported;Feet supported;No upper extremity supported Sitting balance-Leahy Scale: Poor Sitting balance - Comments: pt able to maintain sitting balance without UE support and min guard-min A with verbal and tactile cues to correct posture to midline Postural control: Left lateral lean;Posterior lean                                  Cognition Arousal/Alertness: Awake/alert Behavior During Therapy: WFL for tasks assessed/performed;Flat affect Overall Cognitive Status: Impaired/Different from baseline Area of Impairment: Attention;Following commands;Safety/judgement;Problem solving                   Current Attention Level: Sustained   Following Commands: Follows one step commands with increased time;Follows one step commands consistently Safety/Judgement: Decreased awareness of deficits   Problem Solving: Slow processing;Requires verbal cues;Decreased initiation;Difficulty sequencing;Requires tactile cues General Comments: pt with improving communication and able to state names of grandchildren when looking at picture on window sill      Exercises      General Comments        Pertinent Vitals/Pain Pain Assessment: No/denies pain    Home Living  Prior Function            PT Goals (current goals can now be found in the care plan section) Progress towards PT goals: Progressing toward goals    Frequency    Min 4X/week      PT Plan Current plan remains appropriate    Co-evaluation              AM-PAC PT "6 Clicks" Mobility   Outcome  Measure  Help needed turning from your back to your side while in a flat bed without using bedrails?: A Lot Help needed moving from lying on your back to sitting on the side of a flat bed without using bedrails?: Total Help needed moving to and from a bed to a chair (including a wheelchair)?: Total Help needed standing up from a chair using your arms (e.g., wheelchair or bedside chair)?: A Lot Help needed to walk in hospital room?: Total Help needed climbing 3-5 steps with a railing? : Total 6 Click Score: 8    End of Session Equipment Utilized During Treatment: Gait belt Activity Tolerance: Patient tolerated treatment well Patient left: with call bell/phone within reach;in chair;with chair alarm set Nurse Communication: Mobility status;Need for lift equipment PT Visit Diagnosis: Other abnormalities of gait and mobility (R26.89);Muscle weakness (generalized) (M62.81);History of falling (Z91.81);Other symptoms and signs involving the nervous system (R29.898)     Time: 4818-5631 PT Time Calculation (min) (ACUTE ONLY): 29 min  Charges:  $Therapeutic Activity: 8-22 mins $Neuromuscular Re-education: 8-22 mins                     Erline Levine, PTA Acute Rehabilitation Services Pager: [phone] Office: [phone]     Carolynne Edouard 04/09/2020, 5:08 PM

## 2020-04-09 NOTE — H&P (Addendum)
Physical Medicine and Rehabilitation Admission H&P    Chief Complaint  Patient presents with  . Code Stroke  : HPI: Meredith Shaw is a 74 year old right handed with history of type 2 diabetes mellitus complicated by retinopathy and hypertension.  History taken from chart review and husband due to cognition.  Patient lives with spouse.  1 level home 2 steps to entry.  Patient reportedly ambulating without assistive device prior to admission but sedentary.  She presented on 03/23/2020 with dysarthria and left hemiparesis. CT of the head was negative and TPA was administered.  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  She underwent cerebral angiogram with revascularization per interventional radiology.  She did require short-term intubation.  Hospital course complicated by coffee-ground emesis and low-grade fever worsening of mental status.  Due to concerns for urosepsis she was started on IV meropenem for a short time.  Repeat CT of the head showed progressive right MCA infarct with evidence of reocclusion.  Follow-up MRI personally reviewed, showing multiple >> right MCA infarcts. 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  Bilateral lower extremity Dopplers negative.  Echocardiogram with EF of 70-75% with possible echodensity attached to the posterior mitral valve.  TEE ordered for work-up was negative for vegetation or thrombus and bubble study negative.  EEG completed due to lethargy showed sharp waves in right frontal region and slow generalized lateralization in right hemisphere due to underlying infarct.  No definite seizure identified.  Urology was consulted  04/07/2020 Dr. Jeffie Pollock for hematuria, felt to be related to anticoagulation with noted urine cultures Klebsiella and placed on Ancep.  A recent renal ultrasound was completed during hospital work-up showed no mass or hydronephrosis.  Patient had been on aspirin and Plavix for CVA prophylaxis Plavix discontinued due to bouts of hematuria.  Patient has been cleared for subcutaneous heparin for DVT prophylaxis.  Hospital course further complicated post stroke dysphagia.  Started on dysphagia #1 honey thick liquids and nasogastric tube removed 04/05/2020.  Therapy evaluations completed and patient was admitted for a comprehensive rehab program. Please see preadmission assessment from earlier today as well.   Review of Systems  Unable to perform ROS: Mental acuity   Past Medical History:  Diagnosis Date  . Arthritis    OA AND PAIN BOTH KNEES -RIGHT KNEE HURTS WORSE THAN LEFT  . Cataract   . Diabetes mellitus    ORAL MEDICATION - NO INSULIN  . Hypertension   . Hypertensive retinopathy    OU  . Hypothyroidism   . Kidney stones    NONE AT PRESENT TIME THAT PT IS AWARE OF  . Osteoporosis    Past Surgical History:  Procedure Laterality Date  . ABDOMINAL HYSTERECTOMY    . BUBBLE STUDY  03/26/2020   Procedure: BUBBLE STUDY;  Surgeon: Dorothy Spark, MD;  Location: [place];  Service: Cardiovascular;;  . CATARACT EXTRACTION    . CHOLECYSTECTOMY  1972  . COLONOSCOPY    . EYE SURGERY     RIGHT CATARACT EXTRACTON WITH LENS IMPLANT  . GROWTH REMOVED FROM THYROID    . INTRAMEDULLARY (IM) NAIL INTERTROCHANTERIC Left 02/10/2018   Procedure: INTRAMEDULLARY (IM) NAIL INTERTROCHANTRIC;  Surgeon: Mcarthur Rossetti, MD;  Location:  [facility];  Service: Orthopedics;  Laterality: Left;  . INTRAMEDULLARY (IM) NAIL INTERTROCHANTERIC Right 08/20/2018   Procedure: ORIF RIGHT HIP INTERTROC FRACTURE;  Surgeon: Mcarthur Rossetti, MD;  Location: [place];  Service: Orthopedics;  Laterality: Right;  . IR CT HEAD  LTD  03/24/2020  . IR PERCUTANEOUS ART THROMBECTOMY/INFUSION INTRACRANIAL INC DIAG ANGIO  03/24/2020  . left hip surgery   02/2018  . ORIF HIP FRACTURE Right 08/20/2018  . PERCUTANEOUS NEPHROLITHOTOMY    . RADIOLOGY WITH ANESTHESIA N/A 03/23/2020   Procedure: IR WITH ANESTHESIA;  Surgeon: Luanne Bras, MD;  Location: [place];  Service: Radiology;  Laterality: N/A;  . TEE WITHOUT CARDIOVERSION N/A 03/26/2020   Procedure: TRANSESOPHAGEAL ECHOCARDIOGRAM (TEE);  Surgeon: Dorothy Spark, MD;  Location: [facility];  Service: Cardiovascular;  Laterality: N/A;  . TOTAL KNEE ARTHROPLASTY  06/09/2012   Procedure: TOTAL KNEE ARTHROPLASTY;  Surgeon: Mcarthur Rossetti, MD;  Location: [facility];  Service: Orthopedics;  Laterality: Right;  Right Total Knee Arthroplasty  . TOTAL KNEE ARTHROPLASTY Left 04/09/2014   Procedure: LEFT TOTAL KNEE ARTHROPLASTY;  Surgeon: Mcarthur Rossetti, MD;  Location: [place];  Service: Orthopedics;  Laterality: Left;  . TUBAL LIGATION     Family History  Problem Relation Age of Onset  . Heart disease Mother   . Breast cancer Mother        15's  . Stroke Father   . Heart disease Father   . Healthy Daughter   . Healthy Son   . Liver disease Maternal Grandmother        gall stones imbedded in liver - blead to death when in surgery   . Pneumonia Maternal Grandfather   . Diabetes Paternal Grandmother    Social History:  reports that she has never smoked. She has never used smokeless tobacco. She reports that she does not drink alcohol and does not use drugs. Allergies:  Allergies  Allergen Reactions  . Semaglutide Nausea And Vomiting    PO Rybelsus  . Farxiga [Dapagliflozin]     Yeast infections  . Penicillins Itching and Rash    Has tolerated Cefazlin and Ceftriaxone in past    Facility-Administered Medications Prior to Admission  Medication Dose Route Frequency Provider Last Rate Last Admin  . Bevacizumab (AVASTIN) SOLN 1.25 mg  1.25 mg Intravitreal   Bernarda Caffey, MD   1.25 mg at 12/08/18 1150  . denosumab (PROLIA) injection 60 mg  60 mg Subcutaneous Q6 months Baruch Gouty, FNP   60 mg at 05/17/19 1313   Medications Prior to Admission  Medication Sig Dispense Refill  . amLODipine (NORVASC) 5 MG tablet TAKE ONE (1) TABLET EACH DAY (Patient taking differently: Take 5 mg by mouth daily. ) 90 tablet 0  . aspirin EC 81 MG tablet Take 81 mg by mouth daily.    . cholecalciferol (VITAMIN D) 1000 units tablet Take 5,000 Units by mouth daily.     Marland Kitchen denosumab (PROLIA) 60 MG/ML SOSY injection Inject 60 mg into the skin every 6 (six) months. 1 mL 0  . Dulaglutide (TRULICITY) 0.62 IR/4.8NI SOPN Inject 0.75 mg into the skin once a week.     Marland Kitchen glimepiride (AMARYL) 1 MG tablet Take 1 mg by mouth daily with breakfast.     . ibuprofen (ADVIL) 200 MG tablet Take 400 mg by mouth 2 (two) times daily as needed for moderate pain.     Marland Kitchen levothyroxine (SYNTHROID) 125 MCG tablet Take 1 tablet (125 mcg total) by  mouth daily. 90 tablet 3  . lisinopril (ZESTRIL) 20 MG tablet Take 1 tablet (20 mg total) by mouth daily. 90 tablet 1  . metFORMIN (GLUCOPHAGE) 500 MG tablet Take 1 tablet (500 mg total) by mouth 2 (two) times daily with a meal. (Patient taking differently: Take 500 mg by mouth in the morning and at bedtime. ) 180 tablet 2  . metoprolol tartrate (LOPRESSOR) 100 MG tablet TAKE ONE TABLET BY MOUTH TWICE DAILY (Patient taking differently: Take 100 mg by mouth 2 (two) times daily. ) 180 tablet 0  . naproxen sodium (ALEVE) 220 MG tablet Take 220 mg by mouth 2 (two) times daily as needed (for pain).    . Omega-3 Fatty Acids (FISH OIL PO) Take 1 capsule by mouth daily.    . pravastatin (PRAVACHOL) 40 MG tablet Take 1 tablet (40 mg total) by mouth at bedtime. 90 tablet 2  . Blood Glucose Monitoring Suppl (ONETOUCH VERIO) w/Device KIT 1 each by Does not apply route daily. 1 kit 0  . glucose blood (ONETOUCH VERIO) test strip Use as instructed 100 each 12  . Lancets  (ONETOUCH ULTRASOFT) lancets Use as instructed 100 each 12    Drug Regimen Review Drug regimen was reviewed and remains appropriate with no significant issues identified  Home: Home Living Family/patient expects to be discharged to:: Private residence Living Arrangements: Spouse/significant other Available Help at Discharge: Family, Available 24 hours/day Type of Home: House Home Access: Level entry Entrance Stairs-Number of Steps: 2 Entrance Stairs-Rails: None Home Layout: One level Bathroom Shower/Tub: Multimedia programmer: Handicapped height Bathroom Accessibility: Yes Home Equipment: Environmental consultant - 2 wheels, Bedside commode, Shower seat, Cane - single point, Hand held shower head, Grab bars - toilet, Grab bars - tub/shower, Wheelchair - manual Additional Comments: DME info obtained from prior admission, needs to be confirmed  Lives With: Spouse   Functional History: Prior Function Level of Independence: Needs assistance Gait / Transfers Assistance Needed: pt reports ambulating without assistive device ADL's / Homemaking Assistance Needed: pt requires assistance to don socks  Functional Status:  Mobility: Bed Mobility Overal bed mobility: Needs Assistance Bed Mobility: Supine to Sit Rolling: Mod assist, Max assist Sidelying to sit: Max assist Supine to sit: Max assist, Mod assist, +2 for physical assistance Sit to supine: Total assist, +2 for physical assistance General bed mobility comments: pt assisted more with elevating trunk into sitting vs previous session; multimodal cues for sequencing and assist to bring L LE/hips to EOB and to elevate trunk  Transfers Overall transfer level: Needs assistance Equipment used:  (+2 face to face with gait belt and bed pad ) Transfer via Lift Equipment: Stedy Transfers: Sit to/from Stand, [employer] Transfers Sit to Stand: Mod assist, +2 physical assistance Stand pivot transfers: Max assist, +2 physical assistance   Lateral/Scoot Transfers: Max assist, +2 physical assistance General transfer comment: cues for hand placement, LE positioning, and trunk flexion prior to standing and sequencing and upright posture while pivoting bed to recliner; pt requires assist to power up into standing and bed pad utilized to facilitate hip extension; assistance to pivot both feet Ambulation/Gait General Gait Details: unable    ADL: ADL Overall ADL's : Needs assistance/impaired Eating/Feeding: NPO Eating/Feeding Details (indicate cue type and reason): coretrak Grooming: Maximal assistance, Brushing hair, Sitting Grooming Details (indicate cue type and reason): Unable to reach back of head to date Upper Body Bathing: Sitting, Maximal assistance Upper Body Bathing Details (indicate cue type and reason): Sitting EOB applying lotion,  increased tactile cues to attend to L side (max A to maintain upright positioning due to decreased knowledge of midline and L neglect) Lower Body Bathing: Sitting/lateral leans, Maximal assistance, Cueing for safety, Cueing for compensatory techniques Lower Body Bathing Details (indicate cue type and reason): Sitting EOB applying lotion, increased tactile cues to attend to L side (min A to open container) Upper Body Dressing : Maximal assistance, Sitting Lower Body Dressing: Total assistance, Sitting/lateral leans, Sit to/from stand Lower Body Dressing Details (indicate cue type and reason): recieves LB dressing assist at baseline Toilet Transfer: Maximal assistance, +2 for physical assistance, +2 for safety/equipment Toilet Transfer Details (indicate cue type and reason): stand pivot simulated with recliner Toileting- Clothing Manipulation and Hygiene: Bed level, Total assistance Toileting - Clothing Manipulation Details (indicate cue type and reason): for posterior pericare post BM Functional mobility during ADLs: Moderate assistance, Maximal assistance, +2 for physical assistance, +2 for  safety/equipment General ADL Comments: pt is continuing to demonstrate improvments, with increased ability to attend to the L as well as center herself in midline or increase sitting balance outside base of support, with multimodal cues, pt able to correct with min-mod A sitting EOB before transferring to recliner  Cognition: Cognition Overall Cognitive Status: Impaired/Different from baseline Arousal/Alertness: Lethargic Orientation Level: Oriented X4 Attention: Sustained Sustained Attention: Impaired Sustained Attention Impairment: Verbal basic, Functional basic Safety/Judgment: Impaired Cognition Arousal/Alertness: Awake/alert Behavior During Therapy: WFL for tasks assessed/performed, Flat affect Overall Cognitive Status: Impaired/Different from baseline Area of Impairment: Attention, Following commands, Safety/judgement, Problem solving Current Attention Level: Sustained Memory: Decreased short-term memory Following Commands: Follows one step commands with increased time, Follows one step commands consistently Safety/Judgement: Decreased awareness of deficits Awareness: Emergent Problem Solving: Slow processing, Requires verbal cues, Decreased initiation, Difficulty sequencing, Requires tactile cues General Comments: continues to improve with following commands, communication, and visual tracking to midline and across midline especially during functional task  Physical Exam: Blood pressure 125/68, pulse 81, temperature 98.3 F (36.8 C), temperature source Oral, resp. rate (!) 22, weight 83.6 kg, SpO2 99 %. Physical Exam Vitals reviewed.  Constitutional:      Appearance: Normal appearance.  HENT:     Head: Normocephalic and atraumatic.     Right Ear: External ear normal.     Left Ear: External ear normal.     Nose: Nose normal.  Eyes:     General:        Right eye: No discharge.        Left eye: No discharge.     Comments: Right gaze preference  Pulmonary:     Effort:  Pulmonary effort is normal. No respiratory distress.     Breath sounds: No stridor.  Abdominal:     General: Abdomen is flat. There is no distension.  Musculoskeletal:     Cervical back: Neck supple.     Comments: No edema or tenderness in extremities  Skin:    General: Skin is warm and dry.  Neurological:     Mental Status: She is alert.     Comments: She has significant left neglect Follows simple commands. Global aphasia Right lean Right gaze preference Motor: Limited due to ability to follow commands, Left 0/5 Spontaneous movement noted on right, RUE>RLE  Psychiatric:     Comments: Unable to assess due to mentation     Results for orders placed or performed during the hospital encounter of 03/23/20 (from the past 48 hour(s))  Glucose, capillary     Status: Abnormal  Collection Time: 04/09/20  4:19 PM  Result Value Ref Range   Glucose-Capillary 216 (H) 70 - 99 mg/dL    Comment: Glucose reference range applies only to samples taken after fasting for at least 8 hours.  Glucose, capillary     Status: Abnormal   Collection Time: 04/09/20  9:08 PM  Result Value Ref Range   Glucose-Capillary 259 (H) 70 - 99 mg/dL    Comment: Glucose reference range applies only to samples taken after fasting for at least 8 hours.   Comment 1 Notify RN    Comment 2 Document in Chart   CBC     Status: Abnormal   Collection Time: 04/10/20  3:24 AM  Result Value Ref Range   WBC 10.3 4.0 - 10.5 K/uL   RBC 3.51 (L) 3.87 - 5.11 MIL/uL   Hemoglobin 10.5 (L) 12.0 - 15.0 g/dL   HCT 32.1 (L) 36 - 46 %   MCV 91.5 80.0 - 100.0 fL   MCH 29.9 26.0 - 34.0 pg   MCHC 32.7 30.0 - 36.0 g/dL   RDW 14.7 11.5 - 15.5 %   Platelets 335 150 - 400 K/uL   nRBC 0.0 0.0 - 0.2 %    Comment: Performed at [facility], [place] [address] [place], [place] [address]  Basic metabolic panel     Status: Abnormal   Collection Time: 04/10/20  3:24 AM  Result Value Ref Range   Sodium 140 135 - 145 mmol/L   Potassium  3.6 3.5 - 5.1 mmol/L   Chloride 111 98 - 111 mmol/L   CO2 21 (L) 22 - 32 mmol/L   Glucose, Bld 126 (H) 70 - 99 mg/dL    Comment: Glucose reference range applies only to samples taken after fasting for at least 8 hours.   BUN 16 8 - 23 mg/dL   Creatinine, Ser 1.25 (H) 0.44 - 1.00 mg/dL   Calcium 8.7 (L) 8.9 - 10.3 mg/dL   GFR calc non Af Amer 43 (L) >60 mL/min   GFR calc Af Amer 49 (L) >60 mL/min   Anion gap 8 5 - 15    Comment: Performed at [facility] [address] [place], [place] [address]  Hepatic function panel     Status: Abnormal   Collection Time: 04/10/20  3:24 AM  Result Value Ref Range   Total Protein 5.7 (L) 6.5 - 8.1 g/dL   Albumin 2.6 (L) 3.5 - 5.0 g/dL   AST 30 15 - 41 U/L   ALT 34 0 - 44 U/L   Alkaline Phosphatase 78 38 - 126 U/L   Total Bilirubin 0.4 0.3 - 1.2 mg/dL   Bilirubin, Direct 0.2 0.0 - 0.2 mg/dL   Indirect Bilirubin 0.2 (L) 0.3 - 0.9 mg/dL    Comment: Performed at [facility] [address] [place], [place] [address]  Glucose, capillary     Status: None   Collection Time: 04/10/20  6:04 AM  Result Value Ref Range   Glucose-Capillary 95 70 - 99 mg/dL    Comment: Glucose reference range applies only to samples taken after fasting for at least 8 hours.   Comment 1 Notify RN    Comment 2 Document in Chart   Glucose, capillary     Status: Abnormal   Collection Time: 04/10/20 11:30 AM  Result Value Ref Range   Glucose-Capillary 175 (H) 70 - 99 mg/dL    Comment: Glucose reference range applies only to  samples taken after fasting for at least 8 hours.  Glucose, capillary     Status: Abnormal   Collection Time: 04/10/20  5:06 PM  Result Value Ref Range   Glucose-Capillary 183 (H) 70 - 99 mg/dL    Comment: Glucose reference range applies only to samples taken after fasting for at least 8 hours.  Glucose, capillary     Status: Abnormal   Collection Time: 04/10/20  9:06 PM  Result Value Ref Range   Glucose-Capillary 238 (H) 70 - 99 mg/dL      Comment: Glucose reference range applies only to samples taken after fasting for at least 8 hours.   Comment 1 Notify RN    Comment 2 Document in Chart   CBC     Status: Abnormal   Collection Time: 04/11/20  5:29 AM  Result Value Ref Range   WBC 10.4 4.0 - 10.5 K/uL   RBC 3.54 (L) 3.87 - 5.11 MIL/uL   Hemoglobin 10.4 (L) 12.0 - 15.0 g/dL   HCT 32.1 (L) 36 - 46 %   MCV 90.7 80.0 - 100.0 fL   MCH 29.4 26.0 - 34.0 pg   MCHC 32.4 30.0 - 36.0 g/dL   RDW 14.6 11.5 - 15.5 %   Platelets 348 150 - 400 K/uL   nRBC 0.0 0.0 - 0.2 %    Comment: Performed at [facility], [place] [address], [place], [place] [address]  Basic metabolic panel     Status: Abnormal   Collection Time: 04/11/20  5:29 AM  Result Value Ref Range   Sodium 138 135 - 145 mmol/L   Potassium 3.5 3.5 - 5.1 mmol/L   Chloride 110 98 - 111 mmol/L   CO2 20 (L) 22 - 32 mmol/L   Glucose, Bld 210 (H) 70 - 99 mg/dL    Comment: Glucose reference range applies only to samples taken after fasting for at least 8 hours.   BUN 16 8 - 23 mg/dL   Creatinine, Ser 1.18 (H) 0.44 - 1.00 mg/dL   Calcium 8.6 (L) 8.9 - 10.3 mg/dL   GFR calc non Af Amer 46 (L) >60 mL/min   GFR calc Af Amer 53 (L) >60 mL/min   Anion gap 8 5 - 15    Comment: Performed at [facility] [address] [place], [place] [address]  Glucose, capillary     Status: Abnormal   Collection Time: 04/11/20  6:15 AM  Result Value Ref Range   Glucose-Capillary 204 (H) 70 - 99 mg/dL    Comment: Glucose reference range applies only to samples taken after fasting for at least 8 hours.   Comment 1 Notify RN    Comment 2 Document in Chart   Glucose, capillary     Status: Abnormal   Collection Time: 04/11/20 11:47 AM  Result Value Ref Range   Glucose-Capillary 224 (H) 70 - 99 mg/dL    Comment: Glucose reference range applies only to samples taken after fasting for at least 8 hours.   No results found.     Medical Problem List and Plan: 1.  Left side weakness  with dysarthria and dysphagia secondary to right MCA infarction status post TPA and IR with TIC13 revascularization reoccluded on follow-up, infarct embolic secondary to unknown source.  Status post loop recorder  -patientmay shower  -ELOS/Goals: 23-28 days/Min/Mod A  Admit to CIR 2.  Antithrombotics: -DVT/anticoagulation: Subcutaneous heparin  -antiplatelet therapy: Aspirin 81 mg daily 3. Pain Management: Tylenol as  needed  Monitor for headaches with increased activity. 4. Mood: Amantadine 150 mg daily  -antipsychotic agents: N/A 5. Neuropsych: This patient is not capable of making decisions on her own behalf. 6. Skin/Wound Care: Routine skin checks 7. Fluids/Electrolytes/Nutrition: Routine in and outs. CMP ordered.  8.  Hypertension.  Lopressor 25 mg twice daily, Norvasc 10 mg daily.    Monitor with increased mobility 9.  Post stroke dysphagia: Dysphagia #1 honey thick liquids.  Patient on IV fluids for hydration follow-up speech therapy  Advance diet as tolerated 10.  Hemorrhagic cystitis complicated by UTI.  Complete course of Ancef.  Follow-up outpatient urology 11.  Diabetes mellitus with peripheral neuropathy.  Hemoglobin A1c 8.9.  Levemir 10 units twice daily.  Check blood sugars before meals and at bedtime  Monitor with increased mobility 12.  Hyperlipidemia.  Zetia/Pravacol 13.  Constipation.  MiraLAX. Adjust bowel meds as necessary  Cathlyn Parsons, PA-C 04/11/2020   I have personally performed a face to face diagnostic evaluation, including, but not limited to relevant history and physical exam findings, of this patient and developed relevant assessment and plan.  Additionally, I have reviewed and concur with the physician assistant's documentation above.  Delice Lesch, MD, ABPMR

## 2020-04-09 NOTE — Progress Notes (Signed)
STROKE TEAM PROGRESS NOTE   INTERVAL HISTORY No family is at bedside. Pt lying in bed, awake alert and eyes open. Still has repetitive movement on the right hand but less intensive as yesterday. Still has hematuria but Hb improved. Will do manual bladder irrigation once. Pending CIR appeal.    Vitals:   04/09/20 0010 04/09/20 0314 04/09/20 0500 04/09/20 0745  BP: 136/86 (!) 153/85  117/66  Pulse: 61 65  75  Resp: 20 19  18   Temp: 98.1 F (36.7 C) 98.3 F (36.8 C)  98.2 F (36.8 C)  TempSrc: Oral Oral  Oral  SpO2: 100% 99%  99%  Weight:   76.1 kg    CBC:  Recent Labs  Lab 04/08/20 0340 04/09/20 0431  WBC 11.1* 10.8*  HGB 10.1* 11.3*  HCT 31.5* 34.6*  MCV 92.1 92.5  PLT 366 409*   Basic Metabolic Panel:  Recent Labs  Lab 04/08/20 0340 04/09/20 0431  NA 141 141  K 3.8 4.3  CL 112* 111  CO2 20* 21*  GLUCOSE 120* 108*  BUN 22 18  CREATININE 1.22* 1.17*  CALCIUM 8.6* 8.7*    IMAGING past 24 hours No results found.  PHYSICAL EXAM  Temp:  [97.9 F (36.6 C)-98.7 F (37.1 C)] 98.2 F (36.8 C) (06/30 0745) Pulse Rate:  [61-82] 75 (06/30 0745) Resp:  [18-20] 18 (06/30 0745) BP: (117-153)/(66-86) 117/66 (06/30 0745) SpO2:  [97 %-100 %] 99 % (06/30 0745) Weight:  [76.1 kg] 76.1 kg (06/30 0500)  General - Well nourished, well developed, awake alert, no distress  Ophthalmologic - fundi not visualized due to noncooperation.  Cardiovascular - Regular rhythm and rate.  Neuro - patient is awake alert, eyes open, orientated to place, age, self but not to time. Mild dysarthria, follows simple commands. Left neglect. Eyes right gaze preference, not crossing midline. PERRL. Not blinking to visual threat on the left. Left facial droop. Tongue protrusion midline. LUE mild withdraw to pain. LLE 1/5 withdraw on pain stimulation. RUE spontaneous against gravity and purposeful. RLE 3/5 on pain stimulation. Sensation subjectively symmetrical, coordination intact left FTN and gait  not tested.   ASSESSMENT/PLAN Ms. KERISSA COIA is a 74 y.o. female with history of HTN, HLD, DB presenting with slurred speech, L sided weakness and ataxia. Received IV tPA 03/23/2020 at 2252. Worsening in ED. CTA confirmed distal R M1 occlusion. Taken to IR.    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 ASPECTS 10.     CTA head & neck distal R M2 occlusion w/ moderate collateral flow R MCA territory.   Cerebral angio angio w/ TICI3 revascularization R M1 occlusion   Post IR CT no ICH or mass effect   CT head 6/14 0234 no acute abnormality. Small vessel disease.   CT head 6/14 1232 R MCA territory infarct sparing basal ganglia  CT head 6/14 2159 progressive R MCA territory infarct w/ basal ganglia sparing. Hyperdense R M2  MRI  Large R MCA territory infarct w/ petechial hemorrhage. Scattered small R PCA territory infarcts. Punctate poster L periventricular WM infarct. Old B basal ganglia and thalamic lacunes.   MRA  Reocclusion distal R M1  EEG R frontal sharp waves, generalized and lateralized R brain slowing  LE Doppler  No DVT  2D Echo EF 70-75%. No obvious source of embolus. ?Echodensity L side  of MV  TEE normal MV, EF 70-75%, no PFO  Plan loop recorder before d/c   TG >500->247, Direct LDL 63.8  HgbA1c 8.9  Heparin 5000 units sq tid for VTE prophylaxis  aspirin 81 mg daily prior to admission, was on ASA 325 & Plavix 75 DAPT -> now ASA 81 due to persistent hematuria   Therapy recommendations:  CIR but auth not received from insurance ->husband agrees to SNF->daughter appealing CIR decision. Await insurance response.  Disposition:  pending   Acute Respiratory Failure, resolved  Intubated for IR, left intubated d/t slow response to reversal and involuntary flapping of RUE   Extubated 6/14 followed by coffee ground  emesis   Bronchial hygeine  Was on nasal trumpet -> now off  Tolerating extubation well now  On room air  CPAP at qhs  Leukocytosis   KUB w/o ileus, + hydroureteronephrosis -> outpt urology follow up  6/14 UA w/ WBC > 50 & UCx E coli  BCx no growth   Cefepime 6/14>>6/15  Meropenem 6/15>>6/17  Keflex 6/17>>6/21  Leukocytosis, WBC 11.1->15.6->23.2 ->18.3->12.5->11.1->10.8   6/26 U/A w/ WBC > 50   Urine culture 6/26 >100,000 colonies E coli  Rocephin restarted 6/26>>6/29  Deescalated to ancef 6/29>>  Hematuria   Gross hematuria x 2-3 days  Earlier this admission she has hydroureteronephrosis  Now has recurrent UTI  Has foley catheter, replaced 6/28  Hb 12.4->11.7->11.0->10.4->10.1->11.3  ASA 325 and plavix 75 -> ASA 81 only  Urology consulted Annabell Howells) - appreciate help  Exchanged foley catheter 6/28  Manual irrigation 6/30  Will need further urology help if hematuria not getting better  AKI  Creatinine 1.12-1.51-1.66->1.71->1.30->1.22->1.17  continue IV fluid 75 cc/h  Encourage p.o. intake - pt ate well this am  D/c lisinopril   BMP monitoring  Hypertension  Home meds:  amlodipine 5, lisinopril 20, metoprolol 100 bid  . On Cleviprex -> off  . BP stable  . On amlodipine 10, metoprolol 25 bid . Lisinopril d/c'ed due to AKI . Long-term BP goal 130-150 given right MCA reocclusion  Hyperlipidemia  Home meds:  pravachol 40  TG >500->247, Direct LDL 63.8, goal < 70  pravastatin 40 -> on hold due to elevated LFTs  On zetia 10  AST/ALT 44/72 -> 44/58->pending  Resume pravastatin once LFT normalized  Diabetes type II Uncontrolled  Home meds:  trulicity 0.75, glimepiride 1, metformin 500 bid, ozempic 0.25-0.5  HgbA1c 8.9, goal < 7.0  CBGs  SSI 0-9  Diabetic coordinator following  On Levemir to 10u Bid    Close PCP follow up for better DM control  Dysphagia . Secondary to stroke . Now on Dys 1 and honey thick -> eating  well so far . Cortrak placed -> removed 6/26 . On IVF  . Speech on board   Other Stroke Risk Factors  Advanced age  Family hx stroke (father)  Other Active Problems  Hypokalemia 3.4 - supplement - 4.6->4.0->3.9->3.8->4.3  Lethargy, resolved - on amantadine  Hospital day # 16    Marvel Plan, MD PhD Stroke Neurology 04/09/2020 11:08 AM  To contact Stroke Continuity provider, please refer to [email or web address]. After hours, contact General Neurology

## 2020-04-10 LAB — CBC
HCT: 32.1 % — ABNORMAL LOW (ref 36.0–46.0)
Hemoglobin: 10.5 g/dL — ABNORMAL LOW (ref 12.0–15.0)
MCH: 29.9 pg (ref 26.0–34.0)
MCHC: 32.7 g/dL (ref 30.0–36.0)
MCV: 91.5 fL (ref 80.0–100.0)
Platelets: 335 10*3/uL (ref 150–400)
RBC: 3.51 MIL/uL — ABNORMAL LOW (ref 3.87–5.11)
RDW: 14.7 % (ref 11.5–15.5)
WBC: 10.3 10*3/uL (ref 4.0–10.5)
nRBC: 0 % (ref 0.0–0.2)

## 2020-04-10 LAB — BASIC METABOLIC PANEL
Anion gap: 8 (ref 5–15)
BUN: 16 mg/dL (ref 8–23)
CO2: 21 mmol/L — ABNORMAL LOW (ref 22–32)
Calcium: 8.7 mg/dL — ABNORMAL LOW (ref 8.9–10.3)
Chloride: 111 mmol/L (ref 98–111)
Creatinine, Ser: 1.25 mg/dL — ABNORMAL HIGH (ref 0.44–1.00)
GFR calc Af Amer: 49 mL/min — ABNORMAL LOW (ref >60)
GFR calc non Af Amer: 43 mL/min — ABNORMAL LOW (ref >60)
Glucose, Bld: 126 mg/dL — ABNORMAL HIGH (ref 70–99)
Potassium: 3.6 mmol/L (ref 3.5–5.1)
Sodium: 140 mmol/L (ref 135–145)

## 2020-04-10 LAB — HEPATIC FUNCTION PANEL
ALT: 34 U/L (ref 0–44)
AST: 30 U/L (ref 15–41)
Albumin: 2.6 g/dL — ABNORMAL LOW (ref 3.5–5.0)
Alkaline Phosphatase: 78 U/L (ref 38–126)
Bilirubin, Direct: 0.2 mg/dL (ref 0.0–0.2)
Indirect Bilirubin: 0.2 mg/dL — ABNORMAL LOW (ref 0.3–0.9)
Total Bilirubin: 0.4 mg/dL (ref 0.3–1.2)
Total Protein: 5.7 g/dL — ABNORMAL LOW (ref 6.5–8.1)

## 2020-04-10 LAB — GLUCOSE, CAPILLARY
Glucose-Capillary: 95 mg/dL (ref 70–99)
Glucose-Capillary: 175 mg/dL — ABNORMAL HIGH (ref 70–99)
Glucose-Capillary: 183 mg/dL — ABNORMAL HIGH (ref 70–99)
Glucose-Capillary: 238 mg/dL — ABNORMAL HIGH (ref 70–99)

## 2020-04-10 MED ORDER — PRAVASTATIN SODIUM 40 MG PO TABS
40.0000 mg | ORAL_TABLET | Freq: Every day | ORAL | Status: DC
  Administered 2020-04-10 – 2020-04-11 (×2): 40 mg via ORAL
  Filled 2020-04-10 (×2): qty 1

## 2020-04-10 NOTE — Progress Notes (Addendum)
Occupational Therapy Treatment Patient Details [name] MRN: [id number] DOB: 11/25/1945 Today's Date: 04/10/2020    History of present illness 74 y.o female admitted with sudden onset Lt sided weakness and surred speech.  Tpa was administered.  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 pt extubated 6/14, but had increasing lethsrgy > possibly septic due to UTI.  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  PMH includes: hypertensive retinopathy, DM, arthritis, s/p TKA, s/p ORIF Rt hip, s/p ORIF LT hip.   OT comments  Patient continues to make steady progress towards goals in skilled OT session. Patient's session encompassed extensive neuromuscular re-education on EOB in order to focus on crossing midline, and attending to L side with regard to vision and overall body awareness. Pt demonstrated increased adherence to multimodal cues to attend to L as well as increased encouragement to reach outside base of support on L side. Pt would deviate from min-mod A sitting EOB depending on posterior lean however demonstrated increased ability to power up to standing to complete stand pivot with mod of 2 to power up, and max of 2 to pivot. Pt would continue to greatly benefit from intensive rehab in order to address functional deficits; will continue to follow acutely.    Follow Up Recommendations  CIR    Equipment Recommendations  Wheelchair (measurements OT);Wheelchair cushion (measurements OT);Hospital bed    Recommendations for Other Services      Precautions / Restrictions Precautions Precautions: Fall Precaution Comments: L hemi and neglect (getting closer to inattention vs neglect) Restrictions Weight Bearing Restrictions: No       Mobility Bed Mobility Overal bed mobility: Needs Assistance Bed Mobility: Supine to Sit     Supine to sit: Max assist;Mod assist;+2  for physical assistance     General bed mobility comments: pt assisted more with elevating trunk into sitting vs previous session; multimodal cues for sequencing and assist to bring L LE/hips to EOB and to elevate trunk   Transfers Overall transfer level: Needs assistance Equipment used:  (+2 face to face with gait belt and bed pad ) Transfers: Sit to/from [employer] Transfers Sit to Stand: Mod assist;+2 physical assistance Stand pivot transfers: Max assist;+2 physical assistance       General transfer comment: cues for hand placement, LE positioning, and trunk flexion prior to standing and sequencing and upright posture while pivoting bed to recliner; pt requires assist to power up into standing and bed pad utilized to facilitate hip extension; assistance to pivot both feet    Balance Overall balance assessment: Needs assistance Sitting-balance support: Single extremity supported;Feet supported;Bilateral upper extremity supported Sitting balance-Leahy Scale: Poor Sitting balance - Comments: worked extensively on sitting balance EOB; pt able to correct to midline posture with multimodal cues; pt recognizes when she is leaning but does not correct posture without cues  Postural control: Left lateral lean;Posterior lean Standing balance support: Bilateral upper extremity supported;During functional activity Standing balance-Leahy Scale: Poor                             ADL either performed or assessed with clinical judgement   ADL Overall ADL's : Needs assistance/impaired     Grooming: Maximal assistance;Brushing hair;Sitting Grooming Details (indicate cue type and reason): Unable to reach back of head to date Upper Body Bathing: Sitting;Maximal assistance Upper Body Bathing Details (indicate  cue type and reason): Sitting EOB applying lotion, increased tactile cues to attend to L side (max A to maintain upright positioning due to decreased knowledge of midline and L  neglect) Lower Body Bathing: Sitting/lateral leans;Maximal assistance;Cueing for safety;Cueing for compensatory techniques Lower Body Bathing Details (indicate cue type and reason): Sitting EOB applying lotion, increased tactile cues to attend to L side (min A to open container)         Toilet Transfer: Maximal assistance;+2 for physical assistance;+2 for safety/equipment Toilet Transfer Details (indicate cue type and reason): stand pivot simulated with recliner         Functional mobility during ADLs: Moderate assistance;Maximal assistance;+2 for physical assistance;+2 for safety/equipment General ADL Comments: pt is continuing to demonstrate improvments, with increased ability to attend to the L as well as center herself in midline or increase sitting balance outside base of support, with multimodal cues, pt able to correct with min-mod A sitting EOB before transferring to recliner     Vision       Perception     Praxis      Cognition Arousal/Alertness: Awake/alert Behavior During Therapy: WFL for tasks assessed/performed;Flat affect Overall Cognitive Status: Impaired/Different from baseline Area of Impairment: Attention;Following commands;Safety/judgement;Problem solving                   Current Attention Level: Sustained   Following Commands: Follows one step commands with increased time;Follows one step commands consistently Safety/Judgement: Decreased awareness of deficits Awareness: Emergent Problem Solving: Slow processing;Requires verbal cues;Decreased initiation;Difficulty sequencing;Requires tactile cues General Comments: continues to improve with following commands, communication, and visual tracking to midline and across midline especially during functional task        Exercises     Shoulder Instructions       General Comments      Pertinent Vitals/ Pain       Pain Assessment: No/denies pain Faces Pain Scale: No hurt  Home Living                                           Prior Functioning/Environment              Frequency  Min 2X/week        Progress Toward Goals  OT Goals(current goals can now be found in the care plan section)  Progress towards OT goals: Progressing toward goals  Acute Rehab OT Goals Patient Stated Goal: pt unable to state at this time OT Goal Formulation: With patient Time For Goal Achievement: 04/24/20 Potential to Achieve Goals: Good  Plan Discharge plan remains appropriate;Frequency remains appropriate    Co-evaluation    PT/OT/SLP Co-Evaluation/Treatment: Yes Reason for Co-Treatment: Complexity of the patient's impairments (multi-system involvement);For patient/therapist safety;Necessary to address cognition/behavior during functional activity PT goals addressed during session: Mobility/safety with mobility;Balance OT goals addressed during session: ADL's and self-care;Strengthening/ROM      AM-PAC OT "6 Clicks" Daily Activity     Outcome Measure   Help from another person eating meals?: Total Help from another person taking care of personal grooming?: A Lot Help from another person toileting, which includes using toliet, bedpan, or urinal?: A Lot Help from another person bathing (including washing, rinsing, drying)?: A Lot Help from another person to put on and taking off regular upper body clothing?: A Lot Help from another person to put on and taking off regular lower body  clothing?: Total 6 Click Score: 10    End of Session Equipment Utilized During Treatment: Gait belt  OT Visit Diagnosis: Unsteadiness on feet (R26.81);Other abnormalities of gait and mobility (R26.89);Other symptoms and signs involving the nervous system (R29.898);Other symptoms and signs involving cognitive function;Hemiplegia and hemiparesis Hemiplegia - Right/Left: Left Hemiplegia - dominant/non-dominant: Non-Dominant Hemiplegia - caused by: Cerebral infarction   Activity Tolerance  Patient tolerated treatment well   Patient Left in chair;with call bell/phone within reach;with chair alarm set;Other (comment)   Nurse Communication Mobility status;Need for lift equipment        Time: [phone] OT Time Calculation (min): 37 min  Charges: OT General Charges $OT Visit: 1 Visit OT Treatments $Neuromuscular Re-education: 8-22 mins  Pollyann Glen E. Modene Andy, COTA/L Acute Rehabilitation Services [phone]   Cherlyn Cushing 04/10/2020, 2:25 PM

## 2020-04-10 NOTE — Progress Notes (Signed)
STROKE TEAM PROGRESS NOTE   INTERVAL HISTORY Husband at bedside. Pt neuro stable. No changes. Hematuria getting better, urine now pink color not dark red anymore.  Pending CIR appeal.   Vitals:   04/09/20 2027 04/09/20 2344 04/10/20 0400 04/10/20 0851  BP: (!) 158/82 136/87 (!) 148/86 (!) 152/99  Pulse: 76 77 69 78  Resp: 17 17 18 18   Temp: 98.9 F (37.2 C) 98.4 F (36.9 C) 97.7 F (36.5 C) 98.3 F (36.8 C)  TempSrc: Oral Oral  Oral  SpO2: 95% 96% 95% 98%  Weight:       CBC:  Recent Labs  Lab 04/09/20 0431 04/10/20 0324  WBC 10.8* 10.3  HGB 11.3* 10.5*  HCT 34.6* 32.1*  MCV 92.5 91.5  PLT 409* 335   Basic Metabolic Panel:  Recent Labs  Lab 04/09/20 0431 04/10/20 0324  NA 141 140  K 4.3 3.6  CL 111 111  CO2 21* 21*  GLUCOSE 108* 126*  BUN 18 16  CREATININE 1.17* 1.25*  CALCIUM 8.7* 8.7*    IMAGING past 24 hours No results found.  PHYSICAL EXAM    Temp:  [97.7 F (36.5 C)-98.9 F (37.2 C)] 98.3 F (36.8 C) (07/01 0851) Pulse Rate:  [61-78] 78 (07/01 0851) Resp:  [17-18] 18 (07/01 0851) BP: (121-158)/(75-99) 152/99 (07/01 0851) SpO2:  [95 %-100 %] 98 % (07/01 0851)  General - Well nourished, well developed, awake alert, no distress  Ophthalmologic - fundi not visualized due to noncooperation.  Cardiovascular - Regular rhythm and rate.  Neuro - patient is awake alert, eyes open, orientated to place, age, self but not to time. Mild dysarthria, follows simple commands. Left neglect. Eyes right gaze preference, not crossing midline. PERRL. Not blinking to visual threat on the left. Left facial droop. Tongue protrusion midline. LUE mild withdraw to pain. LLE 1/5 withdraw on pain stimulation. RUE spontaneous against gravity and purposeful. RLE 3/5 on pain stimulation. Sensation subjectively symmetrical, coordination intact left FTN and gait not tested.   ASSESSMENT/PLAN [name] is a 74 y.o. female with history of HTN, HLD, DB presenting with  slurred speech, L sided weakness and ataxia. Received IV tPA 03/23/2020 at 2252. Worsening in ED. CTA confirmed distal R M1 occlusion. Taken to IR.    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 ASPECTS 10.     CTA head & neck distal R M2 occlusion w/ moderate collateral flow R MCA territory.   Cerebral angio angio w/ TICI3 revascularization R M1 occlusion   Post IR CT no ICH or mass effect   CT head 6/14 0234 no acute abnormality. Small vessel disease.   CT head 6/14 1232 R MCA territory infarct sparing basal ganglia  CT head 6/14 2159 progressive R MCA territory infarct w/ basal ganglia sparing. Hyperdense R M2  MRI  Large R MCA territory infarct w/ petechial hemorrhage. Scattered small R PCA territory infarcts. Punctate poster L periventricular WM infarct. Old B basal ganglia and thalamic lacunes.   MRA  Reocclusion distal R M1  EEG R frontal sharp waves, generalized and lateralized R brain slowing  LE Doppler  No DVT  2D Echo EF 70-75%. No obvious source of embolus. ?Echodensity L side of MV  TEE normal MV, EF 70-75%, no PFO  Plan loop recorder before d/c   TG >500->247, Direct LDL  63.8  HgbA1c 8.9  Heparin 5000 units sq tid for VTE prophylaxis  aspirin 81 mg daily prior to admission, was on ASA 325 & Plavix 75 DAPT -> now ASA 81 only due to persistent hematuria   Therapy recommendations:  CIR but auth not received from insurance ->husband agrees to SNF->daughter appealing CIR decision. Await insurance response  Disposition:  pending   Acute Respiratory Failure, resolved  Intubated for IR, left intubated d/t slow response to reversal and involuntary flapping of RUE   Extubated 6/14 followed by coffee ground emesis   Bronchial hygeine  Was on nasal trumpet -> now off  Tolerating extubation well now  On room  air  CPAP at qhs, intermittent use d/t patient refusal  Leukocytosis   KUB w/o ileus, + hydroureteronephrosis -> outpt urology follow up  6/14 UA w/ WBC > 50 & UCx E coli  BCx no growth   Cefepime 6/14>>6/15  Meropenem 6/15>>6/17  Keflex 6/17>>6/21  Leukocytosis, WBC 11.1->15.6->23.2 ->18.3->12.5->11.1->10.8->10.3   6/26 U/A w/ WBC > 50   Urine culture 6/26 >100,000 colonies E coli  Rocephin restarted 6/26>>6/29  Deescalated to ancef 6/29>>  Hematuria   Gross hematuria x 2-3 days  Earlier this admission she has hydroureteronephrosis  Now has recurrent UTI  Has foley catheter, replaced 6/28  Hb 12.4->11.7->11.0->10.4->10.1->11.3->10.5  ASA 325 and plavix 75 -> ASA 81 only  Urology consulted Annabell Howells) - appreciate help  Exchanged foley catheter 6/28  Manual irrigation 6/30  Hematuria improving 04/10/20  Will need further urology help if hematuria not getting better  AKI  Creatinine 1.12-1.51-1.66->1.71->1.30->1.22->1.17->1.25  continue IV fluid 75 cc/h  Encourage p.o. intake - pt ate well this am  D/c lisinopril   BMP monitoring  Hypertension  Home meds:  amlodipine 5, lisinopril 20, metoprolol 100 bid  . On Cleviprex -> off  . BP stable  . On amlodipine 10, metoprolol 25 bid . Lisinopril d/c'ed due to AKI . Long-term BP goal 130-150 given right MCA reocclusion  Hyperlipidemia  Home meds:  pravachol 40  TG >500->247, Direct LDL 63.8, goal < 70  pravastatin 40 -> on hold due to elevated LFTs -> restarted now  On zetia 10  AST/ALT 44/72 -> 44/58->30/34  Continue statin and zetia on discharge.   Diabetes type II Uncontrolled  Home meds:  trulicity 0.75, glimepiride 1, metformin 500 bid, ozempic 0.25-0.5  HgbA1c 8.9, goal < 7.0  CBGs  SSI 0-9  Diabetic coordinator following  On Levemir to 10u Bid    Close PCP follow up for better DM control  Dysphagia . Secondary to stroke . Now on Dys 1 and honey thick -> eating well so  far . Cortrak placed -> removed 6/26 . On IVF  . Speech on board   Other Stroke Risk Factors  Advanced age  Family hx stroke (father)  Other Active Problems  Hypokalemia 3.4 - supplement - 4.6->4.0->3.9->3.8->4.3->3.6  Lethargy, resolved - on amantadine  Hospital day # 17    Marvel Plan, MD PhD Stroke Neurology 04/10/2020 10:11 AM  To contact Stroke Continuity provider, please refer to [email or web address]. After hours, contact General Neurology

## 2020-04-10 NOTE — Progress Notes (Signed)
Inpatient Rehabilitation Admissions Coordinator  I continue to await appeal with [facility] Medicare for a possible CIR admit.  Ottie Glazier, RN, MSN Rehab Admissions Coordinator [phone] 04/10/2020 12:09 PM

## 2020-04-10 NOTE — Progress Notes (Signed)
Pt stated she wasn't really ready to go on cpap at this time. RT informed pt to call for RN when she was ready, and RN would contact RT to place pt on cpap for the night. Pt was unsure as to when she would be ready for cpap

## 2020-04-10 NOTE — Progress Notes (Signed)
RT placed pt on CPAP dream station for the night in auto titrate with max 12 min 6 and no oxygen bled into the system. Pts respiratory status is stable at this time. RT will continue to monitor.

## 2020-04-10 NOTE — Progress Notes (Signed)
Physical Therapy Treatment Patient Details [name] MRN: [id number] DOB: 11/16/1945 Today's Date: 04/10/2020    History of Present Illness 74 y.o female admitted with sudden onset Lt sided weakness and surred speech.  Tpa was administered.  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 pt extubated 6/14, but had increasing lethsrgy > possibly septic due to UTI.  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  PMH includes: hypertensive retinopathy, DM, arthritis, s/p TKA, s/p ORIF Rt hip, s/p ORIF LT hip.    PT Comments    Patient continues to make steady progress toward PT goals and tolerated increased mobility well. This session focused on sitting balance EOB and functional transfer training. Pt able to stand with mod A +2 and pivot to bed to chair with max A +2. Current plan remains appropriate.     Follow Up Recommendations  CIR     Equipment Recommendations  Hospital bed;Other (comment) (mechanical lift if d/c home, other equipment TBD)    Recommendations for Other Services Rehab consult     Precautions / Restrictions Precautions Precautions: Fall Precaution Comments: L hemi and neglect (getting closer to inattention vs neglect) Restrictions Weight Bearing Restrictions: No    Mobility  Bed Mobility Overal bed mobility: Needs Assistance Bed Mobility: Supine to Sit     Supine to sit: Max assist;Mod assist;+2 for physical assistance     General bed mobility comments: pt assisted more with elevating trunk into sitting vs previous session; multimodal cues for sequencing and assist to bring L LE/hips to EOB and to elevate trunk   Transfers Overall transfer level: Needs assistance Equipment used:  (+2 face to face with gait belt and bed pad ) Transfers: Sit to/from [employer] Transfers Sit to Stand: Mod assist;+2 physical assistance Stand pivot  transfers: Max assist;+2 physical assistance       General transfer comment: cues for hand placement, LE positioning, and trunk flexion prior to standing and sequencing and upright posture while pivoting bed to recliner; pt requires assist to power up into standing and bed pad utilized to facilitate hip extension; assistance to pivot both feet  Ambulation/Gait                 Stairs             Wheelchair Mobility    Modified Rankin (Stroke Patients Only) Modified Rankin (Stroke Patients Only) Pre-Morbid Rankin Score: No symptoms Modified Rankin: Severe disability     Balance Overall balance assessment: Needs assistance Sitting-balance support: Single extremity supported;Feet supported;Bilateral upper extremity supported Sitting balance-Leahy Scale: Poor Sitting balance - Comments: worked extensively on sitting balance EOB; pt able to correct to midline posture with multimodal cues; pt recognizes when she is leaning but does not correct posture without cues  Postural control: Left lateral lean;Posterior lean Standing balance support: Bilateral upper extremity supported;During functional activity Standing balance-Leahy Scale: Poor                              Cognition Arousal/Alertness: Awake/alert Behavior During Therapy: WFL for tasks assessed/performed;Flat affect Overall Cognitive Status: Impaired/Different from baseline Area of Impairment: Attention;Following commands;Safety/judgement;Problem solving                   Current Attention Level: Sustained   Following Commands: Follows one step commands with increased time;Follows one step commands consistently  Safety/Judgement: Decreased awareness of deficits   Problem Solving: Slow processing;Requires verbal cues;Decreased initiation;Difficulty sequencing;Requires tactile cues General Comments: continues to improve with following commands, communication, and visual tracking to midline and  across midline especially during functional task      Exercises      General Comments        Pertinent Vitals/Pain Pain Assessment: No/denies pain    Home Living                      Prior Function            PT Goals (current goals can now be found in the care plan section) Progress towards PT goals: Progressing toward goals    Frequency    Min 4X/week      PT Plan Current plan remains appropriate    Co-evaluation PT/OT/SLP Co-Evaluation/Treatment: Yes Reason for Co-Treatment: Complexity of the patient's impairments (multi-system involvement);To address functional/ADL transfers;For patient/therapist safety PT goals addressed during session: Mobility/safety with mobility;Balance        AM-PAC PT "6 Clicks" Mobility   Outcome Measure  Help needed turning from your back to your side while in a flat bed without using bedrails?: A Lot Help needed moving from lying on your back to sitting on the side of a flat bed without using bedrails?: Total Help needed moving to and from a bed to a chair (including a wheelchair)?: A Lot Help needed standing up from a chair using your arms (e.g., wheelchair or bedside chair)?: A Lot Help needed to walk in hospital room?: Total Help needed climbing 3-5 steps with a railing? : Total 6 Click Score: 9    End of Session Equipment Utilized During Treatment: Gait belt Activity Tolerance: Patient tolerated treatment well Patient left: with call bell/phone within reach;in chair;with chair alarm set Nurse Communication: Mobility status;Need for lift equipment PT Visit Diagnosis: Other abnormalities of gait and mobility (R26.89);Muscle weakness (generalized) (M62.81);History of falling (Z91.81);Other symptoms and signs involving the nervous system (J19.147)     Time: 8295-6213 PT Time Calculation (min) (ACUTE ONLY): 37 min  Charges:  $Neuromuscular Re-education: 8-22 mins                     Erline Levine, PTA Acute  Rehabilitation Services Pager: [phone] Office: [phone]     Carolynne Edouard 04/10/2020, 12:07 PM

## 2020-04-11 ENCOUNTER — Inpatient Hospital Stay (HOSPITAL_COMMUNITY)
Admission: RE | Admit: 2020-04-11 | Discharge: 2020-05-05 | DRG: 057 | Disposition: A | Discharge status: Skilled Nursing Facility | Payer: Medicare Other | Source: Intra-hospital | Attending: Physical Medicine & Rehabilitation | Admitting: Physical Medicine & Rehabilitation

## 2020-04-11 ENCOUNTER — Telehealth: Payer: Self-pay | Admitting: Nurse Practitioner

## 2020-04-11 ENCOUNTER — Encounter (HOSPITAL_COMMUNITY)
Admission: EM | Disposition: A | Discharge status: Rehabilitation Facility | Payer: Self-pay | Source: Home / Self Care | Attending: Neurology

## 2020-04-11 DIAGNOSIS — G8194 Hemiplegia, unspecified affecting left nondominant side: Secondary | ICD-10-CM

## 2020-04-11 DIAGNOSIS — E039 Hypothyroidism, unspecified: Secondary | ICD-10-CM | POA: Diagnosis present

## 2020-04-11 DIAGNOSIS — Z803 Family history of malignant neoplasm of breast: Secondary | ICD-10-CM

## 2020-04-11 DIAGNOSIS — E1165 Type 2 diabetes mellitus with hyperglycemia: Secondary | ICD-10-CM | POA: Diagnosis present

## 2020-04-11 DIAGNOSIS — N3091 Cystitis, unspecified with hematuria: Secondary | ICD-10-CM

## 2020-04-11 DIAGNOSIS — L89151 Pressure ulcer of sacral region, stage 1: Secondary | ICD-10-CM | POA: Diagnosis present

## 2020-04-11 DIAGNOSIS — E785 Hyperlipidemia, unspecified: Secondary | ICD-10-CM

## 2020-04-11 DIAGNOSIS — I69391 Dysphagia following cerebral infarction: Secondary | ICD-10-CM | POA: Diagnosis not present

## 2020-04-11 DIAGNOSIS — E119 Type 2 diabetes mellitus without complications: Secondary | ICD-10-CM | POA: Diagnosis not present

## 2020-04-11 DIAGNOSIS — E11319 Type 2 diabetes mellitus with unspecified diabetic retinopathy without macular edema: Secondary | ICD-10-CM | POA: Diagnosis not present

## 2020-04-11 DIAGNOSIS — E1169 Type 2 diabetes mellitus with other specified complication: Secondary | ICD-10-CM

## 2020-04-11 DIAGNOSIS — E1142 Type 2 diabetes mellitus with diabetic polyneuropathy: Secondary | ICD-10-CM | POA: Diagnosis not present

## 2020-04-11 DIAGNOSIS — K2100 Gastro-esophageal reflux disease with esophagitis, without bleeding: Secondary | ICD-10-CM | POA: Diagnosis not present

## 2020-04-11 DIAGNOSIS — K5901 Slow transit constipation: Secondary | ICD-10-CM | POA: Diagnosis present

## 2020-04-11 DIAGNOSIS — R1312 Dysphagia, oropharyngeal phase: Secondary | ICD-10-CM | POA: Diagnosis not present

## 2020-04-11 DIAGNOSIS — E876 Hypokalemia: Secondary | ICD-10-CM | POA: Diagnosis not present

## 2020-04-11 DIAGNOSIS — Z8249 Family history of ischemic heart disease and other diseases of the circulatory system: Secondary | ICD-10-CM

## 2020-04-11 DIAGNOSIS — I69320 Aphasia following cerebral infarction: Secondary | ICD-10-CM

## 2020-04-11 DIAGNOSIS — I63511 Cerebral infarction due to unspecified occlusion or stenosis of right middle cerebral artery: Secondary | ICD-10-CM | POA: Diagnosis present

## 2020-04-11 DIAGNOSIS — Z743 Need for continuous supervision: Secondary | ICD-10-CM | POA: Diagnosis not present

## 2020-04-11 DIAGNOSIS — I10 Essential (primary) hypertension: Secondary | ICD-10-CM

## 2020-04-11 DIAGNOSIS — Z79899 Other long term (current) drug therapy: Secondary | ICD-10-CM | POA: Diagnosis not present

## 2020-04-11 DIAGNOSIS — R0989 Other specified symptoms and signs involving the circulatory and respiratory systems: Secondary | ICD-10-CM

## 2020-04-11 DIAGNOSIS — L8990 Pressure ulcer of unspecified site, unspecified stage: Secondary | ICD-10-CM | POA: Diagnosis present

## 2020-04-11 DIAGNOSIS — M2550 Pain in unspecified joint: Secondary | ICD-10-CM | POA: Diagnosis not present

## 2020-04-11 DIAGNOSIS — I69322 Dysarthria following cerebral infarction: Secondary | ICD-10-CM

## 2020-04-11 DIAGNOSIS — G8110 Spastic hemiplegia affecting unspecified side: Secondary | ICD-10-CM

## 2020-04-11 DIAGNOSIS — E118 Type 2 diabetes mellitus with unspecified complications: Secondary | ICD-10-CM

## 2020-04-11 DIAGNOSIS — Z823 Family history of stroke: Secondary | ICD-10-CM

## 2020-04-11 DIAGNOSIS — K5900 Constipation, unspecified: Secondary | ICD-10-CM | POA: Diagnosis not present

## 2020-04-11 DIAGNOSIS — R404 Transient alteration of awareness: Secondary | ICD-10-CM | POA: Diagnosis not present

## 2020-04-11 DIAGNOSIS — G2581 Restless legs syndrome: Secondary | ICD-10-CM | POA: Diagnosis not present

## 2020-04-11 DIAGNOSIS — Z87442 Personal history of urinary calculi: Secondary | ICD-10-CM | POA: Diagnosis not present

## 2020-04-11 DIAGNOSIS — I6601 Occlusion and stenosis of right middle cerebral artery: Secondary | ICD-10-CM

## 2020-04-11 DIAGNOSIS — N1831 Chronic kidney disease, stage 3a: Secondary | ICD-10-CM | POA: Diagnosis not present

## 2020-04-11 DIAGNOSIS — Z7982 Long term (current) use of aspirin: Secondary | ICD-10-CM

## 2020-04-11 DIAGNOSIS — M810 Age-related osteoporosis without current pathological fracture: Secondary | ICD-10-CM | POA: Diagnosis present

## 2020-04-11 DIAGNOSIS — Z833 Family history of diabetes mellitus: Secondary | ICD-10-CM

## 2020-04-11 DIAGNOSIS — E1159 Type 2 diabetes mellitus with other circulatory complications: Secondary | ICD-10-CM

## 2020-04-11 DIAGNOSIS — Z7401 Bed confinement status: Secondary | ICD-10-CM | POA: Diagnosis not present

## 2020-04-11 DIAGNOSIS — G8114 Spastic hemiplegia affecting left nondominant side: Secondary | ICD-10-CM | POA: Diagnosis not present

## 2020-04-11 DIAGNOSIS — I69328 Other speech and language deficits following cerebral infarction: Secondary | ICD-10-CM | POA: Diagnosis not present

## 2020-04-11 DIAGNOSIS — E1122 Type 2 diabetes mellitus with diabetic chronic kidney disease: Secondary | ICD-10-CM | POA: Diagnosis not present

## 2020-04-11 DIAGNOSIS — L89321 Pressure ulcer of left buttock, stage 1: Secondary | ICD-10-CM | POA: Diagnosis present

## 2020-04-11 DIAGNOSIS — I69954 Hemiplegia and hemiparesis following unspecified cerebrovascular disease affecting left non-dominant side: Secondary | ICD-10-CM | POA: Diagnosis not present

## 2020-04-11 DIAGNOSIS — I6389 Other cerebral infarction: Secondary | ICD-10-CM

## 2020-04-11 DIAGNOSIS — R251 Tremor, unspecified: Secondary | ICD-10-CM | POA: Diagnosis present

## 2020-04-11 DIAGNOSIS — R488 Other symbolic dysfunctions: Secondary | ICD-10-CM | POA: Diagnosis not present

## 2020-04-11 DIAGNOSIS — G459 Transient cerebral ischemic attack, unspecified: Secondary | ICD-10-CM | POA: Diagnosis not present

## 2020-04-11 DIAGNOSIS — Z96653 Presence of artificial knee joint, bilateral: Secondary | ICD-10-CM | POA: Diagnosis present

## 2020-04-11 DIAGNOSIS — I69354 Hemiplegia and hemiparesis following cerebral infarction affecting left non-dominant side: Principal | ICD-10-CM

## 2020-04-11 DIAGNOSIS — I129 Hypertensive chronic kidney disease with stage 1 through stage 4 chronic kidney disease, or unspecified chronic kidney disease: Secondary | ICD-10-CM | POA: Diagnosis not present

## 2020-04-11 DIAGNOSIS — M6281 Muscle weakness (generalized): Secondary | ICD-10-CM | POA: Diagnosis not present

## 2020-04-11 HISTORY — PX: LOOP RECORDER INSERTION: EP1214

## 2020-04-11 LAB — CBC
HCT: 32.1 % — ABNORMAL LOW (ref 36.0–46.0)
Hemoglobin: 10.4 g/dL — ABNORMAL LOW (ref 12.0–15.0)
MCH: 29.4 pg (ref 26.0–34.0)
MCHC: 32.4 g/dL (ref 30.0–36.0)
MCV: 90.7 fL (ref 80.0–100.0)
Platelets: 348 10*3/uL (ref 150–400)
RBC: 3.54 MIL/uL — ABNORMAL LOW (ref 3.87–5.11)
RDW: 14.6 % (ref 11.5–15.5)
WBC: 10.4 10*3/uL (ref 4.0–10.5)
nRBC: 0 % (ref 0.0–0.2)

## 2020-04-11 LAB — BASIC METABOLIC PANEL
Anion gap: 8 (ref 5–15)
BUN: 16 mg/dL (ref 8–23)
CO2: 20 mmol/L — ABNORMAL LOW (ref 22–32)
Calcium: 8.6 mg/dL — ABNORMAL LOW (ref 8.9–10.3)
Chloride: 110 mmol/L (ref 98–111)
Creatinine, Ser: 1.18 mg/dL — ABNORMAL HIGH (ref 0.44–1.00)
GFR calc Af Amer: 53 mL/min — ABNORMAL LOW (ref >60)
GFR calc non Af Amer: 46 mL/min — ABNORMAL LOW (ref >60)
Glucose, Bld: 210 mg/dL — ABNORMAL HIGH (ref 70–99)
Potassium: 3.5 mmol/L (ref 3.5–5.1)
Sodium: 138 mmol/L (ref 135–145)

## 2020-04-11 LAB — GLUCOSE, CAPILLARY
Glucose-Capillary: 204 mg/dL — ABNORMAL HIGH (ref 70–99)
Glucose-Capillary: 224 mg/dL — ABNORMAL HIGH (ref 70–99)
Glucose-Capillary: 158 mg/dL — ABNORMAL HIGH (ref 70–99)
Glucose-Capillary: 151 mg/dL — ABNORMAL HIGH (ref 70–99)

## 2020-04-11 SURGERY — LOOP RECORDER INSERTION

## 2020-04-11 MED ORDER — HEPARIN SODIUM (PORCINE) 5000 UNIT/ML IJ SOLN
5000.0000 [IU] | Freq: Three times a day (TID) | INTRAMUSCULAR | Status: DC
  Administered 2020-04-11 – 2020-04-25 (×39): 5000 [IU] via SUBCUTANEOUS
  Filled 2020-04-11 (×39): qty 1

## 2020-04-11 MED ORDER — ADULT MULTIVITAMIN W/MINERALS CH
1.0000 | ORAL_TABLET | Freq: Every day | ORAL | Status: DC
  Administered 2020-04-12 – 2020-05-05 (×24): 1 via ORAL
  Filled 2020-04-11 (×24): qty 1

## 2020-04-11 MED ORDER — INSULIN ASPART 100 UNIT/ML ~~LOC~~ SOLN: 0.0000 [IU] | Freq: Every day | SUBCUTANEOUS | 11 refills | Status: DC

## 2020-04-11 MED ORDER — ACETAMINOPHEN 325 MG PO TABS: 650.0000 mg | ORAL_TABLET | ORAL | Status: AC | PRN

## 2020-04-11 MED ORDER — LIDOCAINE HCL (PF) 1 % IJ SOLN
INTRAMUSCULAR | Status: DC | PRN
  Administered 2020-04-11: 30 mL

## 2020-04-11 MED ORDER — AMLODIPINE BESYLATE 10 MG PO TABS
10.0000 mg | ORAL_TABLET | Freq: Every day | ORAL | Status: DC
  Administered 2020-04-12 – 2020-05-05 (×24): 10 mg via ORAL
  Filled 2020-04-11 (×24): qty 1

## 2020-04-11 MED ORDER — LEVOTHYROXINE SODIUM 25 MCG PO TABS
125.0000 ug | ORAL_TABLET | Freq: Every day | ORAL | Status: DC
  Administered 2020-04-12 – 2020-05-05 (×24): 125 ug via ORAL
  Filled 2020-04-11 (×24): qty 1

## 2020-04-11 MED ORDER — INSULIN ASPART 100 UNIT/ML ~~LOC~~ SOLN
0.0000 [IU] | Freq: Three times a day (TID) | SUBCUTANEOUS | Status: DC
  Administered 2020-04-12: 2 [IU] via SUBCUTANEOUS
  Administered 2020-04-12: 1 [IU] via SUBCUTANEOUS
  Administered 2020-04-13: 2 [IU] via SUBCUTANEOUS
  Administered 2020-04-13 (×2): 1 [IU] via SUBCUTANEOUS
  Administered 2020-04-14: 3 [IU] via SUBCUTANEOUS
  Administered 2020-04-14 (×2): 2 [IU] via SUBCUTANEOUS
  Administered 2020-04-15 (×2): 3 [IU] via SUBCUTANEOUS
  Administered 2020-04-15 – 2020-04-16 (×2): 2 [IU] via SUBCUTANEOUS
  Administered 2020-04-16: 1 [IU] via SUBCUTANEOUS
  Administered 2020-04-16 – 2020-04-17 (×2): 2 [IU] via SUBCUTANEOUS
  Administered 2020-04-17: 1 [IU] via SUBCUTANEOUS
  Administered 2020-04-17: 3 [IU] via SUBCUTANEOUS
  Administered 2020-04-18: 2 [IU] via SUBCUTANEOUS
  Administered 2020-04-18: 7 [IU] via SUBCUTANEOUS
  Administered 2020-04-18 – 2020-04-19 (×3): 3 [IU] via SUBCUTANEOUS
  Administered 2020-04-19 – 2020-04-20 (×2): 2 [IU] via SUBCUTANEOUS
  Administered 2020-04-20: 3 [IU] via SUBCUTANEOUS
  Administered 2020-04-20: 2 [IU] via SUBCUTANEOUS
  Administered 2020-04-21: 3 [IU] via SUBCUTANEOUS
  Administered 2020-04-21: 2 [IU] via SUBCUTANEOUS
  Administered 2020-04-21: 1 [IU] via SUBCUTANEOUS
  Administered 2020-04-22: 5 [IU] via SUBCUTANEOUS
  Administered 2020-04-22: 3 [IU] via SUBCUTANEOUS
  Administered 2020-04-23: 1 [IU] via SUBCUTANEOUS
  Administered 2020-04-23 (×2): 3 [IU] via SUBCUTANEOUS
  Administered 2020-04-24: 1 [IU] via SUBCUTANEOUS
  Administered 2020-04-24 (×2): 5 [IU] via SUBCUTANEOUS
  Administered 2020-04-25 (×3): 3 [IU] via SUBCUTANEOUS
  Administered 2020-04-26 (×3): 2 [IU] via SUBCUTANEOUS
  Administered 2020-04-27: 5 [IU] via SUBCUTANEOUS
  Administered 2020-04-27: 2 [IU] via SUBCUTANEOUS
  Administered 2020-04-28 (×3): 1 [IU] via SUBCUTANEOUS
  Administered 2020-04-29: 3 [IU] via SUBCUTANEOUS
  Administered 2020-04-29: 2 [IU] via SUBCUTANEOUS
  Administered 2020-04-30: 1 [IU] via SUBCUTANEOUS
  Administered 2020-04-30: 2 [IU] via SUBCUTANEOUS
  Administered 2020-04-30: 3 [IU] via SUBCUTANEOUS
  Administered 2020-05-01 (×2): 2 [IU] via SUBCUTANEOUS
  Administered 2020-05-02 (×2): 1 [IU] via SUBCUTANEOUS
  Administered 2020-05-02 – 2020-05-03 (×2): 2 [IU] via SUBCUTANEOUS
  Administered 2020-05-03: 1 [IU] via SUBCUTANEOUS
  Administered 2020-05-03: 2 [IU] via SUBCUTANEOUS
  Administered 2020-05-04: 1 [IU] via SUBCUTANEOUS
  Administered 2020-05-04: 3 [IU] via SUBCUTANEOUS
  Administered 2020-05-04 – 2020-05-05 (×2): 2 [IU] via SUBCUTANEOUS

## 2020-04-11 MED ORDER — SENNOSIDES-DOCUSATE SODIUM 8.6-50 MG PO TABS: 1.0000 | ORAL_TABLET | Freq: Every evening | ORAL | Status: DC | PRN

## 2020-04-11 MED ORDER — LIDOCAINE-EPINEPHRINE 1 %-1:100000 IJ SOLN
INTRAMUSCULAR | Status: AC
  Filled 2020-04-11: qty 1

## 2020-04-11 MED ORDER — RESOURCE THICKENUP CLEAR PO POWD: 1.0000 | ORAL | Status: DC | PRN

## 2020-04-11 MED ORDER — LIDOCAINE-EPINEPHRINE 1 %-1:100000 IJ SOLN
INTRAMUSCULAR | Status: DC | PRN
  Administered 2020-04-11: 30 mL

## 2020-04-11 MED ORDER — CEFAZOLIN SODIUM-DEXTROSE 1-4 GM/50ML-% IV SOLN
1.0000 g | Freq: Three times a day (TID) | INTRAVENOUS | Status: AC
  Administered 2020-04-11 – 2020-04-15 (×12): 1 g via INTRAVENOUS
  Filled 2020-04-11 (×12): qty 50

## 2020-04-11 MED ORDER — METOPROLOL TARTRATE 25 MG PO TABS
25.0000 mg | ORAL_TABLET | Freq: Two times a day (BID) | ORAL | Status: DC
  Administered 2020-04-11 – 2020-05-05 (×48): 25 mg via ORAL
  Filled 2020-04-11 (×48): qty 1

## 2020-04-11 MED ORDER — EZETIMIBE 10 MG PO TABS: 10.0000 mg | ORAL_TABLET | Freq: Every day | ORAL | Status: DC

## 2020-04-11 MED ORDER — BISACODYL 5 MG PO TBEC: 5.0000 mg | DELAYED_RELEASE_TABLET | Freq: Every day | ORAL | 0 refills | Status: DC

## 2020-04-11 MED ORDER — AMANTADINE HCL 50 MG/5ML PO SYRP
150.0000 mg | ORAL_SOLUTION | Freq: Every day | ORAL | Status: DC
  Administered 2020-04-12 – 2020-04-16 (×5): 150 mg via ORAL
  Filled 2020-04-11 (×5): qty 15

## 2020-04-11 MED ORDER — CEFAZOLIN SODIUM-DEXTROSE 1-4 GM/50ML-% IV SOLN: 1.0000 g | Freq: Three times a day (TID) | INTRAVENOUS | Status: DC

## 2020-04-11 MED ORDER — SODIUM CHLORIDE 0.9 % IV SOLN: 50.0000 mL | INTRAVENOUS | 0 refills | Status: DC

## 2020-04-11 MED ORDER — CHLORHEXIDINE GLUCONATE CLOTH 2 % EX PADS: 6.0000 | MEDICATED_PAD | Freq: Every day | CUTANEOUS | Status: DC

## 2020-04-11 MED ORDER — EZETIMIBE 10 MG PO TABS
10.0000 mg | ORAL_TABLET | Freq: Every day | ORAL | Status: DC
  Administered 2020-04-12 – 2020-05-05 (×24): 10 mg via ORAL
  Filled 2020-04-11 (×25): qty 1

## 2020-04-11 MED ORDER — AMANTADINE HCL 50 MG/5ML PO SYRP: 150.0000 mg | ORAL_SOLUTION | Freq: Every day | ORAL | 0 refills | Status: DC

## 2020-04-11 MED ORDER — ADULT MULTIVITAMIN W/MINERALS CH: 1.0000 | ORAL_TABLET | Freq: Every day | ORAL | Status: DC

## 2020-04-11 MED ORDER — ASPIRIN EC 81 MG PO TBEC
81.0000 mg | DELAYED_RELEASE_TABLET | Freq: Every day | ORAL | Status: DC
  Administered 2020-04-12 – 2020-05-05 (×24): 81 mg via ORAL
  Filled 2020-04-11 (×24): qty 1

## 2020-04-11 MED ORDER — METOPROLOL TARTRATE 25 MG PO TABS: 25.0000 mg | ORAL_TABLET | Freq: Two times a day (BID) | ORAL | Status: DC

## 2020-04-11 MED ORDER — PRAVASTATIN SODIUM 40 MG PO TABS
40.0000 mg | ORAL_TABLET | Freq: Every day | ORAL | Status: DC
  Administered 2020-04-12 – 2020-05-04 (×23): 40 mg via ORAL
  Filled 2020-04-11 (×25): qty 1

## 2020-04-11 MED ORDER — ACETAMINOPHEN 325 MG PO TABS
650.0000 mg | ORAL_TABLET | ORAL | Status: DC | PRN
  Administered 2020-04-12 – 2020-05-05 (×24): 650 mg via ORAL
  Filled 2020-04-11 (×25): qty 2

## 2020-04-11 MED ORDER — BISACODYL 5 MG PO TBEC
5.0000 mg | DELAYED_RELEASE_TABLET | Freq: Every day | ORAL | Status: DC
  Administered 2020-04-12 – 2020-05-05 (×24): 5 mg via ORAL
  Filled 2020-04-11 (×24): qty 1

## 2020-04-11 MED ORDER — INSULIN DETEMIR 100 UNIT/ML ~~LOC~~ SOLN
10.0000 [IU] | Freq: Two times a day (BID) | SUBCUTANEOUS | Status: DC
  Administered 2020-04-11 – 2020-04-14 (×6): 10 [IU] via SUBCUTANEOUS
  Filled 2020-04-11 (×8): qty 0.1

## 2020-04-11 MED ORDER — POLYETHYLENE GLYCOL 3350 17 G PO PACK
17.0000 g | PACK | Freq: Every day | ORAL | Status: DC
  Administered 2020-04-12 – 2020-04-28 (×17): 17 g via ORAL
  Filled 2020-04-11 (×17): qty 1

## 2020-04-11 MED ORDER — HEPARIN SODIUM (PORCINE) 5000 UNIT/ML IJ SOLN: 5000.0000 [IU] | Freq: Three times a day (TID) | INTRAMUSCULAR | Status: DC

## 2020-04-11 MED ORDER — PRAVASTATIN SODIUM 40 MG PO TABS: 40.0000 mg | ORAL_TABLET | Freq: Every day | ORAL | Status: DC

## 2020-04-11 MED ORDER — RESOURCE THICKENUP CLEAR PO POWD
ORAL | Status: DC | PRN
  Filled 2020-04-11: qty 125

## 2020-04-11 MED ORDER — AMLODIPINE BESYLATE 10 MG PO TABS: 10.0000 mg | ORAL_TABLET | Freq: Every day | ORAL | Status: DC

## 2020-04-11 MED ORDER — POLYETHYLENE GLYCOL 3350 17 G PO PACK: 17.0000 g | PACK | Freq: Every day | ORAL | 0 refills | Status: DC

## 2020-04-11 MED ORDER — INSULIN ASPART 100 UNIT/ML ~~LOC~~ SOLN: 0.0000 [IU] | Freq: Three times a day (TID) | SUBCUTANEOUS | 11 refills | Status: DC

## 2020-04-11 MED ORDER — ACETAMINOPHEN 650 MG RE SUPP: 650.0000 mg | RECTAL | Status: DC | PRN

## 2020-04-11 MED ORDER — ACETAMINOPHEN 160 MG/5ML PO SOLN
650.0000 mg | ORAL | Status: DC | PRN
  Administered 2020-04-26: 650 mg
  Filled 2020-04-11: qty 20.3

## 2020-04-11 MED ORDER — PANTOPRAZOLE SODIUM 40 MG PO TBEC: 40.0000 mg | DELAYED_RELEASE_TABLET | Freq: Every day | ORAL | Status: AC

## 2020-04-11 MED ORDER — PANTOPRAZOLE SODIUM 40 MG PO TBEC
40.0000 mg | DELAYED_RELEASE_TABLET | Freq: Every day | ORAL | Status: DC
  Filled 2020-04-11: qty 1

## 2020-04-11 MED ORDER — BLOOD PRESSURE CONTROL BOOK
Freq: Once | Status: AC
  Filled 2020-04-11: qty 1

## 2020-04-11 MED ORDER — ORAL CARE MOUTH RINSE: 15.0000 mL | Freq: Two times a day (BID) | OROMUCOSAL | 0 refills | Status: DC

## 2020-04-11 MED ORDER — LIVING WELL WITH DIABETES BOOK
Freq: Once | Status: AC
  Filled 2020-04-11: qty 1

## 2020-04-11 MED ORDER — INSULIN DETEMIR 100 UNIT/ML ~~LOC~~ SOLN: 10.0000 [IU] | Freq: Two times a day (BID) | SUBCUTANEOUS | 11 refills | Status: DC

## 2020-04-11 SURGICAL SUPPLY — 2 items
MONITOR REVEAL LINQ II (Prosthesis & Implant Heart) ×2 IMPLANT
PACK LOOP INSERTION (CUSTOM PROCEDURE TRAY) ×3 IMPLANT

## 2020-04-11 NOTE — Progress Notes (Signed)
Marcello Fennel, MD  Physician  Physical Medicine and Rehabilitation  PMR Pre-admission     Addendum  Date of Service:  03/29/2020  3:03 PM      Related encounter: ED to Hosp-Admission (Current) from 03/23/2020 in [facility]       Show:Clear all [x] Manual[x] Template[x] Copied  Added by: [x] , , RN[x] Beckie Salts, Lauren P, CCC-SLP[x] Tye Maryland, MD  [] Hover for details PMR Admission Coordinator Pre-Admission Assessment   Patient: Meredith Shaw is an 74 y.o., female MRN: DOB: [birth year] Height:   Weight: 83.6 kg                                                                                                                                                  Insurance Information HMO: yes    PPO:      PCP:      IPA:      80/20:      OTHER:  PRIMARY: U[employer] Medicare      Policy#: [id number]      Subscriber: patient CM Name: [id number] with expedited appeal optum/[employer] medicare. Initially denied and then approved on appeal      Phone#: [phone] for [phone] continued stay     Fax#: [employer] Pre-Cert#: [id number]  Approved for 7 days  With f/u with [name]  Employer:  Benefits:  Phone #: [phone]     Name: 6/23 Eff. Date: 01/10/2020     Deduct: none      Out of Pocket Max: $3600      Life Max: none  CIR: $295 co pay per day days 1 until 5      SNF: no copay days 1 until 20:$184 co pay per day days 21 until 40; no copay days 41 until 100 Outpatient: $30 per visit     Co-Pay: visits per medical neccesity Home Health: 100%      Co-Pay: visits per medical neccesity DME: 80%     Co-Pay: 20% Providers: in-network  SECONDARY:   none    Financial Counselor:       Phone#:    The 7/23 for patients in Inpatient Rehabilitation Facilities with attached Privacy Act Statement-Health Care Records was provided and verbally reviewed with: Family   Emergency Contact Information Contact Information      Name Relation Home Work [place] T 09-10-1987 [phone]   [phone]    [name],[name] Daughter     [phone]    Linetta, Regner     [phone]       Current Medical History  Patient Admitting Diagnosis: R MCA infarct s/p TPA   History of Present Illness:   74 year old right handed with history of type 2 diabetes mellitus complicated by retinopathy and hypertension.    Presented 03/23/2020 with slurred speech and left-sided weakness.  CT of the head was negative and TPA was administered.  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  She underwent cerebral angiogram with revascularization per interventional radiology.  She did require short-term intubation.  Patient did have episodes of coffee-ground emesis low-grade fever worsening of mental status.  Due to concerns for urosepsis she was started on IV meropenem for a short time.  Repeat CT of the head showed progressive right MCA infarct with evidence of reocclusion.  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  Bilateral lower extremity Dopplers negative.  Echocardiogram with ejection fraction of 70-75% with possible echodensity attached to the posterior mitral valve.  TEE ordered for work-up was negative for vegetation or thrombus and bubble study negative.  EEG completed due to lethargy showed sharp waves in right frontal region and slow generalized lateralization in right hemisphere due to underlying infarct.  No definite seizure identified.  Urology was consulted 04/07/2020 Dr. Annabell Howells for bouts of hematuria felt to be related to anticoagulation with noted urine cultures  Klebsiella and placed on Ancef.  A recent renal ultrasound was completed during hospital work-up showed no mass or hydronephrosis.  Patient had been on aspirin and Plavix for CVA prophylaxis Plavix discontinued due to bouts of hematuria.  Patient has been cleared for subcutaneous heparin for DVT prophylaxis.  Dysphagia #1 honey thick liquids and nasogastric tube removed 04/05/2020.  LOOP recorder to be placed today prior to admit to CIR.   Complete NIHSS TOTAL: 19 Glasgow Coma Scale Score: 15   Past Medical History      Past Medical History:  Diagnosis Date   Arthritis      OA AND PAIN BOTH KNEES -RIGHT KNEE HURTS WORSE THAN LEFT   Cataract     Diabetes mellitus      ORAL MEDICATION - NO INSULIN   Hypertension     Hypertensive retinopathy      OU   Hypothyroidism     Kidney stones      NONE AT PRESENT TIME THAT PT IS AWARE OF   Osteoporosis        Family History  family history includes Breast cancer in her mother; Diabetes in her paternal grandmother; Healthy in her daughter and son; Heart disease in her father and mother; Liver disease in her maternal grandmother; Pneumonia in her maternal grandfather; Stroke in her father.   Prior Rehab/Hospitalizations:  Has the patient had prior rehab or hospitalizations prior to admission? yes   Has the patient had major surgery during 100 days prior to admission? Yes   Current Medications    Current Facility-Administered Medications:    0.9 %  sodium chloride infusion, , Intravenous, Continuous, Marvel Plan, MD, Last Rate: 75 mL/hr at 04/11/20 0615, New Bag at 04/11/20 0615   0.9 %  sodium chloride infusion, , Intravenous, PRN, Marvel Plan, MD, Last Rate: 10 mL/hr at 04/10/20 0515, Rate Verify at 04/10/20 0515   acetaminophen (TYLENOL) tablet 650 mg, 650 mg, Oral, Q4H PRN **OR** acetaminophen (TYLENOL) 160 MG/5ML solution 650 mg, 650 mg, Per Tube, Q4H PRN, 650 mg at 03/29/20 1409 **OR** acetaminophen (TYLENOL) suppository 650 mg,  650 mg, Rectal, Q4H PRN, Lars Masson, MD, 650 mg at 03/25/20  0753   amantadine (SYMMETREL) 50 MG/5ML solution 150 mg, 150 mg, Oral, Daily, Marvel Plan, MD, 150 mg at 04/11/20 0515   amLODipine (NORVASC) tablet 10 mg, 10 mg, Oral, Daily, Marvel Plan, MD, 10 mg at 04/11/20 1002   aspirin EC tablet 81 mg, 81 mg, Oral, Daily, Marvel Plan, MD, 81 mg at 04/11/20 1002   bisacodyl (DULCOLAX) EC tablet 5 mg, 5 mg, Oral, Daily, Marvel Plan, MD, 5 mg at 04/11/20 1002   ceFAZolin (ANCEF) IVPB 1 g/50 mL premix, 1 g, Intravenous, Q8H, Marvel Plan, MD, Last Rate: 100 mL/hr at 04/11/20 0522, 1 g at 04/11/20 0522   Chlorhexidine Gluconate Cloth 2 % PADS 6 each, 6 each, Topical, Daily, Lars Masson, MD, 6 each at 04/11/20 1011   ezetimibe (ZETIA) tablet 10 mg, 10 mg, Oral, Daily, Marvel Plan, MD, 10 mg at 04/11/20 1002   heparin injection 5,000 Units, 5,000 Units, Subcutaneous, Q8H, Marvel Plan, MD, 5,000 Units at 04/11/20 0518   insulin aspart (novoLOG) injection 0-5 Units, 0-5 Units, Subcutaneous, QHS, Marvel Plan, MD, 3 Units at 04/09/20 2245   insulin aspart (novoLOG) injection 0-9 Units, 0-9 Units, Subcutaneous, TID WC, Marvel Plan, MD, 3 Units at 04/11/20 [phone]   insulin detemir (LEVEMIR) injection 10 Units, 10 Units, Subcutaneous, BID, Biby, Sharon L, NP, 10 Units at 04/11/20 1003   MEDLINE mouth rinse, 15 mL, Mouth Rinse, BID, Lars Masson, MD, 15 mL at 04/11/20 1011   metoprolol tartrate (LOPRESSOR) injection 2.5-5 mg, 2.5-5 mg, Intravenous, Q3H PRN, Lars Masson, MD, 2.5 mg at 03/28/20 0143   metoprolol tartrate (LOPRESSOR) tablet 25 mg, 25 mg, Oral, BID, Marvel Plan, MD, 25 mg at 04/11/20 1002   multivitamin with minerals tablet 1 tablet, 1 tablet, Oral, Daily, Micki Riley, MD, 1 tablet at 04/11/20 1002   ondansetron (ZOFRAN) injection 4 mg, 4 mg, Intravenous, Q6H PRN, Lars Masson, MD   pantoprazole (PROTONIX) EC tablet 40 mg, 40 mg, Oral, Daily, Marvel Plan, MD, 40 mg at 04/11/20 1002   polyethylene glycol (MIRALAX / GLYCOLAX) packet 17 g, 17 g, Oral, Daily, Marvel Plan, MD, 17 g at 04/11/20 1003   pravastatin (PRAVACHOL) tablet 40 mg, 40 mg, Oral, q1800, Marvel Plan, MD, 40 mg at 04/10/20 1757   Resource ThickenUp Clear, , Oral, PRN, Micki Riley, MD   senna-docusate (Senokot-S) tablet 1 tablet, 1 tablet, Oral, QHS PRN, Marvel Plan, MD   Patients Current Diet:     Diet Order                      DIET - DYS 1 Room service appropriate? Yes with Assist; Fluid consistency: Honey Thick  Diet effective now                      Precautions / Restrictions Precautions Precautions: Fall Precaution Comments: L hemi and neglect (getting closer to inattention vs neglect) Restrictions Weight Bearing Restrictions: No    Has the patient had 2 or more falls or a fall with injury in the past year?No   Prior Activity Level Community (5-7x/wk): driving, gets out of house to eat with husband. Sedentary   Prior Functional Level Prior Function Level of Independence: Needs assistance Gait / Transfers Assistance Needed: pt reports ambulating without assistive device ADL's / Homemaking Assistance Needed: pt requires assistance to don socks   Self Care: Did the patient need help bathing, dressing, using the toilet or eating?  Independent  Indoor Mobility: Did the patient need assistance with walking from room to room (with or without device)? Independent   Stairs: Did the patient need assistance with internal or external stairs (with or without device)? Independent   Functional Cognition: Did the patient need help planning regular tasks such as shopping or remembering to take medications? Independent   Home Assistive Devices / Equipment Home Equipment: Environmental consultant - 2 wheels, Bedside commode, Shower seat, Cane - single point, Hand held shower head, Grab bars - toilet, Grab bars - tub/shower, Wheelchair - manual   Prior Device Use:  Indicate devices/aids used by the patient prior to current illness, exacerbation or injury? None of the above   Current Functional Level Cognition   Arousal/Alertness: Lethargic Overall Cognitive Status: Impaired/Different from baseline Current Attention Level: Sustained Orientation Level: Oriented X4 Following Commands: Follows one step commands with increased time, Follows one step commands consistently Safety/Judgement: Decreased awareness of deficits General Comments: continues to improve with following commands, communication, and visual tracking to midline and across midline especially during functional task Attention: Sustained Sustained Attention: Impaired Sustained Attention Impairment: Verbal basic, Functional basic Safety/Judgment: Impaired    Extremity Assessment (includes Sensation/Coordination)   Upper Extremity Assessment: LUE deficits/detail, RUE deficits/detail RUE Deficits / Details: rhythmic uncontrollable jerking movements, pt looks as if she is tapping hand on mattress LUE Deficits / Details: flaccid, no active movement noted. Dense inattention also noted  Lower Extremity Assessment: Defer to PT evaluation RLE Deficits / Details: grossly 4-/5, PT does note tremoring of RLE at rest, intermittent rhythmic tapping of R side pt reports is unintentional RLE Sensation: WNL LLE Deficits / Details: pt with 3-/5 R knee and hip flexion, other motion appears non-purposeful and automatic, limited AROM noted. Pt with some increase in adductor tone, PROM WFL LLE Sensation: decreased light touch (no response to pain)     ADLs   Overall ADL's : Needs assistance/impaired Eating/Feeding: NPO Eating/Feeding Details (indicate cue type and reason): coretrak Grooming: Maximal assistance, Brushing hair, Sitting Grooming Details (indicate cue type and reason): Unable to reach back of head to date Upper Body Bathing: Sitting, Maximal assistance Upper Body Bathing Details (indicate cue  type and reason): Sitting EOB applying lotion, increased tactile cues to attend to L side (max A to maintain upright positioning due to decreased knowledge of midline and L neglect) Lower Body Bathing: Sitting/lateral leans, Maximal assistance, Cueing for safety, Cueing for compensatory techniques Lower Body Bathing Details (indicate cue type and reason): Sitting EOB applying lotion, increased tactile cues to attend to L side (min A to open container) Upper Body Dressing : Maximal assistance, Sitting Lower Body Dressing: Total assistance, Sitting/lateral leans, Sit to/from stand Lower Body Dressing Details (indicate cue type and reason): recieves LB dressing assist at baseline Toilet Transfer: Maximal assistance, +2 for physical assistance, +2 for safety/equipment Toilet Transfer Details (indicate cue type and reason): stand pivot simulated with recliner Toileting- Clothing Manipulation and Hygiene: Bed level, Total assistance Toileting - Clothing Manipulation Details (indicate cue type and reason): for posterior pericare post BM Functional mobility during ADLs: Moderate assistance, Maximal assistance, +2 for physical assistance, +2 for safety/equipment General ADL Comments: pt is continuing to demonstrate improvments, with increased ability to attend to the L as well as center herself in midline or increase sitting balance outside base of support, with multimodal cues, pt able to correct with min-mod A sitting EOB before transferring to recliner     Mobility   Overal bed mobility: Needs Assistance Bed Mobility: Supine to  Sit Rolling: Mod assist, Max assist Sidelying to sit: Max assist Supine to sit: Max assist, Mod assist, +2 for physical assistance Sit to supine: Total assist, +2 for physical assistance General bed mobility comments: pt assisted more with elevating trunk into sitting vs previous session; multimodal cues for sequencing and assist to bring L LE/hips to EOB and to elevate trunk        Transfers   Overall transfer level: Needs assistance Equipment used:  (+2 face to face with gait belt and bed pad ) Transfer via Lift Equipment: Stedy Transfers: Sit to/from Stand, [employer]adark[employer] Petroleum CorporationStand Pivot Transfers Sit to Stand: Mod assist, +2 physical assistance Stand pivot transfers: Max assist, +2 physical assistance  Lateral/Scoot Transfers: Max assist, +2 physical assistance General transfer comment: cues for hand placement, LE positioning, and trunk flexion prior to standing and sequencing and upright posture while pivoting bed to recliner; pt requires assist to power up into standing and bed pad utilized to facilitate hip extension; assistance to pivot both feet     Ambulation / Gait / Stairs / Wheelchair Mobility   Ambulation/Gait General Gait Details: unable     Posture / Balance Dynamic Sitting Balance Sitting balance - Comments: worked extensively on sitting balance EOB; pt able to correct to midline posture with multimodal cues; pt recognizes when she is leaning but does not correct posture without cues  Balance Overall balance assessment: Needs assistance Sitting-balance support: Single extremity supported, Feet supported, Bilateral upper extremity supported Sitting balance-Leahy Scale: Poor Sitting balance - Comments: worked extensively on sitting balance EOB; pt able to correct to midline posture with multimodal cues; pt recognizes when she is leaning but does not correct posture without cues  Postural control: Left lateral lean, Posterior lean Standing balance support: Bilateral upper extremity supported, During functional activity Standing balance-Leahy Scale: Poor Standing balance comment: reliant on external assist     Special needs/care consideration   Designated visitor Elsie SaasGerald Mannings, husband, Diamantina Monkseresa  Daughter and son, Windy FastRonald Hgb A1c 8.9 Loop recorder to be placed on 7/2 prior to admit to CIR Indwelling catheter placed 6/14 MBS 6/23 recommended Dysphagia 1 with nectar  thick and meds crushed with puree    Previous Home Environment  Living Arrangements: Spouse/significant other  Lives With: Spouse Available Help at Discharge: Family, Available 24 hours/day Type of Home: House Home Layout: One level Home Access: Level entry Entrance Stairs-Rails: None Entrance Stairs-Number of Steps: 2 Bathroom Shower/Tub: Health visitorWalk-in shower Bathroom Toilet: Handicapped height Bathroom Accessibility: Yes How Accessible: Accessible via wheelchair, Accessible via walker Home Care Services: No Additional Comments: DME info obtained from prior admission, needs to be confirmed   Discharge Living Setting Plans for Discharge Living Setting: Patient's home Type of Home at Discharge: House Discharge Home Layout: One level Discharge Home Access: Level entry Discharge Bathroom Shower/Tub: Walk-in shower Discharge Bathroom Toilet: Handicapped height Discharge Bathroom Accessibility: Yes How Accessible: Accessible via wheelchair, Accessible via walker   Social/Family/Support Systems Patient Roles: Spouse Anticipated Caregiver: Donavan Burneteresa [name], daughter, Len ChildsRonald Hebdon, son Anticipated Caregiver's Contact Information: [phone] Caregiver Availability: 24/7 Discharge Plan Discussed with Primary Caregiver: Yes Is Caregiver In Agreement with Plan?: Yes Does Caregiver/Family have Issues with Lodging/Transportation while Pt is in Rehab?: No   Goals Patient/Family Goal for Rehab: Min/Mod A: PT/OT, Supervision: ST Expected length of stay: 17-20 days Pt/Family Agrees to Admission and willing to participate: Yes Program Orientation Provided & Reviewed with Pt/Caregiver Including Roles  & Responsibilities: Yes   Decrease burden of Care through IP rehab admission:  n/a   Possible need for SNF placement upon discharge:If patient does not meet goals, family would look into SNF   Patient Condition: This patient's medical and functional status has changed since the  consult dated: 03/27/2020 in which the Rehabilitation Physician determined and documented that the patient's condition is appropriate for intensive rehabilitative care in an inpatient rehabilitation facility. See "History of Present Illness" (above) for medical update. Functional changes are: overall max assist. Patient's medical and functional status update has been discussed with the Rehabilitation physician and patient remains appropriate for inpatient rehabilitation. Will admit to inpatient rehab today.   Preadmission Screen Completed By:  Clois Dupes, RN, 04/11/2020 11:06 AM ______________________________________________________________________   Discussed status with Dr. Allena Katz on 04/11/2020 at  1108 and received approval for admission today.   Admission Coordinator: Ottie Glazier RN MSN Clois Dupes, time 1610 Date 04/11/2020         Revision History                                              Note Details  Author Marcello Fennel, MD File Time 04/11/2020 11:38 AM  Author Type Physician Status Addendum  Last Editor Marcello Fennel, MD Service Physical Medicine and Rehabilitation

## 2020-04-11 NOTE — Procedures (Signed)
Patient declined CPAP for tonight.  

## 2020-04-11 NOTE — Progress Notes (Addendum)
Inpatient Diabetes Program Recommendations  AACE/ADA: New Consensus Statement on Inpatient Glycemic Control (2015)  Target Ranges:  Prepandial:   less than 140 mg/dL      Peak postprandial:   less than 180 mg/dL (1-2 hours)      Critically ill patients:  140 - 180 mg/dL   Lab Results  Component Value Date   GLUCAP 204 (H) 04/11/2020   HGBA1C 8.9 (H) 03/24/2020    Review of Glycemic Control Results for Meredith Shaw, Meredith Shaw (MRN [id number]) as of 04/11/2020 10:29  Ref. Range 04/10/2020 06:04 04/10/2020 11:30 04/10/2020 17:06 04/10/2020 21:06 04/11/2020 06:15  Glucose-Capillary Latest Ref Range: 70 - 99 mg/dL 95 400 (H) 867 (H) 619 (H) 204 (H)   Diabetes history: DM 2 Outpatient Diabetes medications: Trulicity 0.75 weekly, Amaryl 1 mg Daily, Metformin 500 mg bid Current orders for Inpatient glycemic control:  Novolog 0-9 units tid + hs Levemir 10 units bid  Inpatient Diabetes Program Recommendations:    Glucose trends increase after meal intake, fasting glucose 90's yesterday.   -  Consider ordering Amaryl 1 mg Daily.  Thanks,  Christena Deem RN, MSN, BC-ADM Inpatient Diabetes Coordinator Team Pager [phone] (8a-5p)

## 2020-04-11 NOTE — Telephone Encounter (Signed)
Patient admitted for stroke, now transferring to inpatient rehab at [facility] Will not cancel Trulicity shipment at this time.  Encouraged daughter to call back if she needs me to cancel order

## 2020-04-11 NOTE — H&P (Signed)
Physical Medicine and Rehabilitation Admission H&P    Chief Complaint  Patient presents with  . Code Stroke  : HPI: Meredith Shaw is a 74 year old right handed with history of type 2 diabetes mellitus complicated by retinopathy and hypertension.  History taken from chart review and husband due to cognition.  Patient lives with spouse.  1 level home 2 steps to entry.  Patient reportedly ambulating without assistive device prior to admission but sedentary.  She presented on 03/23/2020 with dysarthria and left hemiparesis. CT of the head was negative and TPA was administered.  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  She underwent cerebral angiogram with revascularization per interventional radiology.  She did require short-term intubation.  Hospital course complicated by coffee-ground emesis and low-grade fever worsening of mental status.  Due to concerns for urosepsis she was started on IV meropenem for a short time.  Repeat CT of the head showed progressive right MCA infarct with evidence of reocclusion.  Follow-up MRI personally reviewed, showing multiple >> right MCA infarcts. 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  Bilateral lower extremity Dopplers negative.  Echocardiogram with EF of 70-75% with possible echodensity attached to the posterior mitral valve.  TEE ordered for work-up was negative for vegetation or thrombus and bubble study negative.  EEG completed due to lethargy showed sharp waves in right frontal region and slow generalized lateralization in right hemisphere due to underlying infarct.  No definite seizure identified.  Urology was consulted  04/07/2020 Dr. Jeffie Pollock for hematuria, felt to be related to anticoagulation with noted urine cultures Klebsiella and placed on Ancep.  A recent renal ultrasound was completed during hospital work-up showed no mass or hydronephrosis.  Patient had been on aspirin and Plavix for CVA prophylaxis Plavix discontinued due to bouts of hematuria.  Patient has been cleared for subcutaneous heparin for DVT prophylaxis.  Hospital course further complicated post stroke dysphagia.  Started on dysphagia #1 honey thick liquids and nasogastric tube removed 04/05/2020.  Therapy evaluations completed and patient was admitted for a comprehensive rehab program. Please see preadmission assessment from earlier today as well.   Review of Systems  Unable to perform ROS: Mental acuity   Past Medical History:  Diagnosis Date  . Arthritis    OA AND PAIN BOTH KNEES -RIGHT KNEE HURTS WORSE THAN LEFT  . Cataract   . Diabetes mellitus    ORAL MEDICATION - NO INSULIN  . Hypertension   . Hypertensive retinopathy    OU  . Hypothyroidism   . Kidney stones    NONE AT PRESENT TIME THAT PT IS AWARE OF  . Osteoporosis    Past Surgical History:  Procedure Laterality Date  . ABDOMINAL HYSTERECTOMY    . BUBBLE STUDY  03/26/2020   Procedure: BUBBLE STUDY;  Surgeon: Dorothy Spark, MD;  Location: [place];  Service: Cardiovascular;;  . CATARACT EXTRACTION    . CHOLECYSTECTOMY  1972  . COLONOSCOPY    . EYE SURGERY     RIGHT CATARACT EXTRACTON WITH LENS IMPLANT  . GROWTH REMOVED FROM THYROID    . INTRAMEDULLARY (IM) NAIL INTERTROCHANTERIC Left 02/10/2018   Procedure: INTRAMEDULLARY (IM) NAIL INTERTROCHANTRIC;  Surgeon: Mcarthur Rossetti, MD;  Location:  [facility];  Service: Orthopedics;  Laterality: Left;  . INTRAMEDULLARY (IM) NAIL INTERTROCHANTERIC Right 08/20/2018   Procedure: ORIF RIGHT HIP INTERTROC FRACTURE;  Surgeon: Mcarthur Rossetti, MD;  Location: [place];  Service: Orthopedics;  Laterality: Right;  . IR CT HEAD  LTD  03/24/2020  . IR PERCUTANEOUS ART THROMBECTOMY/INFUSION INTRACRANIAL INC DIAG ANGIO  03/24/2020  . left hip surgery   02/2018  . ORIF HIP FRACTURE Right 08/20/2018  . PERCUTANEOUS NEPHROLITHOTOMY    . RADIOLOGY WITH ANESTHESIA N/A 03/23/2020   Procedure: IR WITH ANESTHESIA;  Surgeon: Luanne Bras, MD;  Location: [place];  Service: Radiology;  Laterality: N/A;  . TEE WITHOUT CARDIOVERSION N/A 03/26/2020   Procedure: TRANSESOPHAGEAL ECHOCARDIOGRAM (TEE);  Surgeon: Dorothy Spark, MD;  Location: [facility] ENDOSCOPY;  Service: Cardiovascular;  Laterality: N/A;  . TOTAL KNEE ARTHROPLASTY  06/09/2012   Procedure: TOTAL KNEE ARTHROPLASTY;  Surgeon: Mcarthur Rossetti, MD;  Location: [facility];  Service: Orthopedics;  Laterality: Right;  Right Total Knee Arthroplasty  . TOTAL KNEE ARTHROPLASTY Left 04/09/2014   Procedure: LEFT TOTAL KNEE ARTHROPLASTY;  Surgeon: Mcarthur Rossetti, MD;  Location: [place];  Service: Orthopedics;  Laterality: Left;  . TUBAL LIGATION     Family History  Problem Relation Age of Onset  . Heart disease Mother   . Breast cancer Mother        15's  . Stroke Father   . Heart disease Father   . Healthy Daughter   . Healthy Son   . Liver disease Maternal Grandmother        gall stones imbedded in liver - blead to death when in surgery   . Pneumonia Maternal Grandfather   . Diabetes Paternal Grandmother    Social History:  reports that she has never smoked. She has never used smokeless tobacco. She reports that she does not drink alcohol and does not use drugs. Allergies:  Allergies  Allergen Reactions  . Semaglutide Nausea And Vomiting    PO Rybelsus  . Farxiga [Dapagliflozin]     Yeast infections  . Penicillins Itching and Rash    Has tolerated Cefazlin and Ceftriaxone in past    Facility-Administered Medications Prior to Admission  Medication Dose Route Frequency Provider Last Rate Last Admin  . Bevacizumab (AVASTIN) SOLN 1.25 mg  1.25 mg Intravitreal   Bernarda Caffey, MD   1.25 mg at 12/08/18 1150  . denosumab (PROLIA) injection 60 mg  60 mg Subcutaneous Q6 months Baruch Gouty, FNP   60 mg at 05/17/19 1313   Medications Prior to Admission  Medication Sig Dispense Refill  . amLODipine (NORVASC) 5 MG tablet TAKE ONE (1) TABLET EACH DAY (Patient taking differently: Take 5 mg by mouth daily. ) 90 tablet 0  . aspirin EC 81 MG tablet Take 81 mg by mouth daily.    . cholecalciferol (VITAMIN D) 1000 units tablet Take 5,000 Units by mouth daily.     Marland Kitchen denosumab (PROLIA) 60 MG/ML SOSY injection Inject 60 mg into the skin every 6 (six) months. 1 mL 0  . Dulaglutide (TRULICITY) 0.62 IR/4.8NI SOPN Inject 0.75 mg into the skin once a week.     Marland Kitchen glimepiride (AMARYL) 1 MG tablet Take 1 mg by mouth daily with breakfast.     . ibuprofen (ADVIL) 200 MG tablet Take 400 mg by mouth 2 (two) times daily as needed for moderate pain.     Marland Kitchen levothyroxine (SYNTHROID) 125 MCG tablet Take 1 tablet (125 mcg total) by  mouth daily. 90 tablet 3  . lisinopril (ZESTRIL) 20 MG tablet Take 1 tablet (20 mg total) by mouth daily. 90 tablet 1  . metFORMIN (GLUCOPHAGE) 500 MG tablet Take 1 tablet (500 mg total) by mouth 2 (two) times daily with a meal. (Patient taking differently: Take 500 mg by mouth in the morning and at bedtime. ) 180 tablet 2  . metoprolol tartrate (LOPRESSOR) 100 MG tablet TAKE ONE TABLET BY MOUTH TWICE DAILY (Patient taking differently: Take 100 mg by mouth 2 (two) times daily. ) 180 tablet 0  . naproxen sodium (ALEVE) 220 MG tablet Take 220 mg by mouth 2 (two) times daily as needed (for pain).    . Omega-3 Fatty Acids (FISH OIL PO) Take 1 capsule by mouth daily.    . pravastatin (PRAVACHOL) 40 MG tablet Take 1 tablet (40 mg total) by mouth at bedtime. 90 tablet 2  . Blood Glucose Monitoring Suppl (ONETOUCH VERIO) w/Device KIT 1 each by Does not apply route daily. 1 kit 0  . glucose blood (ONETOUCH VERIO) test strip Use as instructed 100 each 12  . Lancets  (ONETOUCH ULTRASOFT) lancets Use as instructed 100 each 12    Drug Regimen Review Drug regimen was reviewed and remains appropriate with no significant issues identified  Home: Home Living Family/patient expects to be discharged to:: Private residence Living Arrangements: Spouse/significant other Available Help at Discharge: Family, Available 24 hours/day Type of Home: House Home Access: Level entry Entrance Stairs-Number of Steps: 2 Entrance Stairs-Rails: None Home Layout: One level Bathroom Shower/Tub: Multimedia programmer: Handicapped height Bathroom Accessibility: Yes Home Equipment: Environmental consultant - 2 wheels, Bedside commode, Shower seat, Cane - single point, Hand held shower head, Grab bars - toilet, Grab bars - tub/shower, Wheelchair - manual Additional Comments: DME info obtained from prior admission, needs to be confirmed  Lives With: Spouse   Functional History: Prior Function Level of Independence: Needs assistance Gait / Transfers Assistance Needed: pt reports ambulating without assistive device ADL's / Homemaking Assistance Needed: pt requires assistance to don socks  Functional Status:  Mobility: Bed Mobility Overal bed mobility: Needs Assistance Bed Mobility: Supine to Sit Rolling: Mod assist, Max assist Sidelying to sit: Max assist Supine to sit: Max assist, Mod assist, +2 for physical assistance Sit to supine: Total assist, +2 for physical assistance General bed mobility comments: pt assisted more with elevating trunk into sitting vs previous session; multimodal cues for sequencing and assist to bring L LE/hips to EOB and to elevate trunk  Transfers Overall transfer level: Needs assistance Equipment used:  (+2 face to face with gait belt and bed pad ) Transfer via Lift Equipment: Stedy Transfers: Sit to/from Stand, [employer] Transfers Sit to Stand: Mod assist, +2 physical assistance Stand pivot transfers: Max assist, +2 physical assistance   Lateral/Scoot Transfers: Max assist, +2 physical assistance General transfer comment: cues for hand placement, LE positioning, and trunk flexion prior to standing and sequencing and upright posture while pivoting bed to recliner; pt requires assist to power up into standing and bed pad utilized to facilitate hip extension; assistance to pivot both feet Ambulation/Gait General Gait Details: unable    ADL: ADL Overall ADL's : Needs assistance/impaired Eating/Feeding: NPO Eating/Feeding Details (indicate cue type and reason): coretrak Grooming: Maximal assistance, Brushing hair, Sitting Grooming Details (indicate cue type and reason): Unable to reach back of head to date Upper Body Bathing: Sitting, Maximal assistance Upper Body Bathing Details (indicate cue type and reason): Sitting EOB applying lotion,  increased tactile cues to attend to L side (max A to maintain upright positioning due to decreased knowledge of midline and L neglect) Lower Body Bathing: Sitting/lateral leans, Maximal assistance, Cueing for safety, Cueing for compensatory techniques Lower Body Bathing Details (indicate cue type and reason): Sitting EOB applying lotion, increased tactile cues to attend to L side (min A to open container) Upper Body Dressing : Maximal assistance, Sitting Lower Body Dressing: Total assistance, Sitting/lateral leans, Sit to/from stand Lower Body Dressing Details (indicate cue type and reason): recieves LB dressing assist at baseline Toilet Transfer: Maximal assistance, +2 for physical assistance, +2 for safety/equipment Toilet Transfer Details (indicate cue type and reason): stand pivot simulated with recliner Toileting- Clothing Manipulation and Hygiene: Bed level, Total assistance Toileting - Clothing Manipulation Details (indicate cue type and reason): for posterior pericare post BM Functional mobility during ADLs: Moderate assistance, Maximal assistance, +2 for physical assistance, +2 for  safety/equipment General ADL Comments: pt is continuing to demonstrate improvments, with increased ability to attend to the L as well as center herself in midline or increase sitting balance outside base of support, with multimodal cues, pt able to correct with min-mod A sitting EOB before transferring to recliner  Cognition: Cognition Overall Cognitive Status: Impaired/Different from baseline Arousal/Alertness: Lethargic Orientation Level: Oriented X4 Attention: Sustained Sustained Attention: Impaired Sustained Attention Impairment: Verbal basic, Functional basic Safety/Judgment: Impaired Cognition Arousal/Alertness: Awake/alert Behavior During Therapy: WFL for tasks assessed/performed, Flat affect Overall Cognitive Status: Impaired/Different from baseline Area of Impairment: Attention, Following commands, Safety/judgement, Problem solving Current Attention Level: Sustained Memory: Decreased short-term memory Following Commands: Follows one step commands with increased time, Follows one step commands consistently Safety/Judgement: Decreased awareness of deficits Awareness: Emergent Problem Solving: Slow processing, Requires verbal cues, Decreased initiation, Difficulty sequencing, Requires tactile cues General Comments: continues to improve with following commands, communication, and visual tracking to midline and across midline especially during functional task  Physical Exam: Blood pressure 125/68, pulse 81, temperature 98.3 F (36.8 C), temperature source Oral, resp. rate (!) 22, weight 83.6 kg, SpO2 99 %. Physical Exam Vitals reviewed.  Constitutional:      Appearance: Normal appearance.  HENT:     Head: Normocephalic and atraumatic.     Right Ear: External ear normal.     Left Ear: External ear normal.     Nose: Nose normal.  Eyes:     General:        Right eye: No discharge.        Left eye: No discharge.     Comments: Right gaze preference  Pulmonary:     Effort:  Pulmonary effort is normal. No respiratory distress.     Breath sounds: No stridor.  Abdominal:     General: Abdomen is flat. There is no distension.  Musculoskeletal:     Cervical back: Neck supple.     Comments: No edema or tenderness in extremities  Skin:    General: Skin is warm and dry.  Neurological:     Mental Status: She is alert.     Comments: She has significant left neglect Follows simple commands. Global aphasia Right lean Right gaze preference Motor: Limited due to ability to follow commands, Left 0/5 Spontaneous movement noted on right, RUE>RLE  Psychiatric:     Comments: Unable to assess due to mentation     Results for orders placed or performed during the hospital encounter of 03/23/20 (from the past 48 hour(s))  Glucose, capillary     Status: Abnormal  Collection Time: 04/09/20  4:19 PM  Result Value Ref Range   Glucose-Capillary 216 (H) 70 - 99 mg/dL    Comment: Glucose reference range applies only to samples taken after fasting for at least 8 hours.  Glucose, capillary     Status: Abnormal   Collection Time: 04/09/20  9:08 PM  Result Value Ref Range   Glucose-Capillary 259 (H) 70 - 99 mg/dL    Comment: Glucose reference range applies only to samples taken after fasting for at least 8 hours.   Comment 1 Notify RN    Comment 2 Document in Chart   CBC     Status: Abnormal   Collection Time: 04/10/20  3:24 AM  Result Value Ref Range   WBC 10.3 4.0 - 10.5 K/uL   RBC 3.51 (L) 3.87 - 5.11 MIL/uL   Hemoglobin 10.5 (L) 12.0 - 15.0 g/dL   HCT 32.1 (L) 36 - 46 %   MCV 91.5 80.0 - 100.0 fL   MCH 29.9 26.0 - 34.0 pg   MCHC 32.7 30.0 - 36.0 g/dL   RDW 14.7 11.5 - 15.5 %   Platelets 335 150 - 400 K/uL   nRBC 0.0 0.0 - 0.2 %    Comment: Performed at [facility], [place] [address] [place], [place] [address]  Basic metabolic panel     Status: Abnormal   Collection Time: 04/10/20  3:24 AM  Result Value Ref Range   Sodium 140 135 - 145 mmol/L   Potassium  3.6 3.5 - 5.1 mmol/L   Chloride 111 98 - 111 mmol/L   CO2 21 (L) 22 - 32 mmol/L   Glucose, Bld 126 (H) 70 - 99 mg/dL    Comment: Glucose reference range applies only to samples taken after fasting for at least 8 hours.   BUN 16 8 - 23 mg/dL   Creatinine, Ser 1.25 (H) 0.44 - 1.00 mg/dL   Calcium 8.7 (L) 8.9 - 10.3 mg/dL   GFR calc non Af Amer 43 (L) >60 mL/min   GFR calc Af Amer 49 (L) >60 mL/min   Anion gap 8 5 - 15    Comment: Performed at [facility] [address] [place], [place] [address]  Hepatic function panel     Status: Abnormal   Collection Time: 04/10/20  3:24 AM  Result Value Ref Range   Total Protein 5.7 (L) 6.5 - 8.1 g/dL   Albumin 2.6 (L) 3.5 - 5.0 g/dL   AST 30 15 - 41 U/L   ALT 34 0 - 44 U/L   Alkaline Phosphatase 78 38 - 126 U/L   Total Bilirubin 0.4 0.3 - 1.2 mg/dL   Bilirubin, Direct 0.2 0.0 - 0.2 mg/dL   Indirect Bilirubin 0.2 (L) 0.3 - 0.9 mg/dL    Comment: Performed at [facility] [address] [place], [place] [address]  Glucose, capillary     Status: None   Collection Time: 04/10/20  6:04 AM  Result Value Ref Range   Glucose-Capillary 95 70 - 99 mg/dL    Comment: Glucose reference range applies only to samples taken after fasting for at least 8 hours.   Comment 1 Notify RN    Comment 2 Document in Chart   Glucose, capillary     Status: Abnormal   Collection Time: 04/10/20 11:30 AM  Result Value Ref Range   Glucose-Capillary 175 (H) 70 - 99 mg/dL    Comment: Glucose reference range applies only to  samples taken after fasting for at least 8 hours.  Glucose, capillary     Status: Abnormal   Collection Time: 04/10/20  5:06 PM  Result Value Ref Range   Glucose-Capillary 183 (H) 70 - 99 mg/dL    Comment: Glucose reference range applies only to samples taken after fasting for at least 8 hours.  Glucose, capillary     Status: Abnormal   Collection Time: 04/10/20  9:06 PM  Result Value Ref Range   Glucose-Capillary 238 (H) 70 - 99 mg/dL     Comment: Glucose reference range applies only to samples taken after fasting for at least 8 hours.   Comment 1 Notify RN    Comment 2 Document in Chart   CBC     Status: Abnormal   Collection Time: 04/11/20  5:29 AM  Result Value Ref Range   WBC 10.4 4.0 - 10.5 K/uL   RBC 3.54 (L) 3.87 - 5.11 MIL/uL   Hemoglobin 10.4 (L) 12.0 - 15.0 g/dL   HCT 32.1 (L) 36 - 46 %   MCV 90.7 80.0 - 100.0 fL   MCH 29.4 26.0 - 34.0 pg   MCHC 32.4 30.0 - 36.0 g/dL   RDW 14.6 11.5 - 15.5 %   Platelets 348 150 - 400 K/uL   nRBC 0.0 0.0 - 0.2 %    Comment: Performed at [facility], [place] [address], [place], [place] [address]  Basic metabolic panel     Status: Abnormal   Collection Time: 04/11/20  5:29 AM  Result Value Ref Range   Sodium 138 135 - 145 mmol/L   Potassium 3.5 3.5 - 5.1 mmol/L   Chloride 110 98 - 111 mmol/L   CO2 20 (L) 22 - 32 mmol/L   Glucose, Bld 210 (H) 70 - 99 mg/dL    Comment: Glucose reference range applies only to samples taken after fasting for at least 8 hours.   BUN 16 8 - 23 mg/dL   Creatinine, Ser 1.18 (H) 0.44 - 1.00 mg/dL   Calcium 8.6 (L) 8.9 - 10.3 mg/dL   GFR calc non Af Amer 46 (L) >60 mL/min   GFR calc Af Amer 53 (L) >60 mL/min   Anion gap 8 5 - 15    Comment: Performed at [facility] [address], [place], [place] [address]  Glucose, capillary     Status: Abnormal   Collection Time: 04/11/20  6:15 AM  Result Value Ref Range   Glucose-Capillary 204 (H) 70 - 99 mg/dL    Comment: Glucose reference range applies only to samples taken after fasting for at least 8 hours.   Comment 1 Notify RN    Comment 2 Document in Chart   Glucose, capillary     Status: Abnormal   Collection Time: 04/11/20 11:47 AM  Result Value Ref Range   Glucose-Capillary 224 (H) 70 - 99 mg/dL    Comment: Glucose reference range applies only to samples taken after fasting for at least 8 hours.   No results found.     Medical Problem List and Plan: 1.  Left side weakness  with dysarthria and dysphagia secondary to right MCA infarction status post TPA and IR with TIC13 revascularization reoccluded on follow-up, infarct embolic secondary to unknown source.  Status post loop recorder  -patientmay shower  -ELOS/Goals: 23-28 days/Min/Mod A  Admit to CIR 2.  Antithrombotics: -DVT/anticoagulation: Subcutaneous heparin  -antiplatelet therapy: Aspirin 81 mg daily 3. Pain Management: Tylenol as needed  Monitor for headaches with increased activity. 4. Mood: Amantadine 150 mg daily  -antipsychotic agents: N/A 5. Neuropsych: This patient is not capable of making decisions on her own behalf. 6. Skin/Wound Care: Routine skin checks 7. Fluids/Electrolytes/Nutrition: Routine in and outs. CMP ordered.  8.  Hypertension.  Lopressor 25 mg twice daily, Norvasc 10 mg daily.    Monitor with increased mobility 9.  Post stroke dysphagia: Dysphagia #1 honey thick liquids.  Patient on IV fluids for hydration follow-up speech therapy  Advance diet as tolerated 10.  Hemorrhagic cystitis complicated by UTI.  Complete course of Ancef.  Follow-up outpatient urology 11.  Diabetes mellitus with peripheral neuropathy.  Hemoglobin A1c 8.9.  Levemir 10 units twice daily.  Check blood sugars before meals and at bedtime  Monitor with increased mobility 12.  Hyperlipidemia.  Zetia/Pravacol 13.  Constipation.  MiraLAX. Adjust bowel meds as necessary  Cathlyn Parsons, PA-C 04/11/2020   I have personally performed a face to face diagnostic evaluation, including, but not limited to relevant history and physical exam findings, of this patient and developed relevant assessment and plan.  Additionally, I have reviewed and concur with the physician assistant's documentation above.  Delice Lesch, MD, ABPMR  The patient's status has not changed. The original post admission physician evaluation remains appropriate, and any changes from the pre-admission screening or documentation from the acute chart  are noted above.   Delice Lesch, MD, ABPMR

## 2020-04-11 NOTE — Discharge Instructions (Signed)
Wound care instructions (heart monitor implant site) Keep incision clean and dry for 3 days. You can remove outer dressing tomorrow. Leave steri-strips (little pieces of tape) on until seen in the office for wound check appointment. Call the office ([phone]) for redness, drainage, swelling, or fever.  

## 2020-04-11 NOTE — TOC Transition Note (Signed)
Transition of Care [facility]) - CM/SW Discharge Note   Patient Details  [name] MRN: [id number] Date of Birth: August 05, 1946  [facility]) CM/SW Contact:  Kermit Balo, RN Phone Number: 04/11/2020, 1:08 PM   Clinical Narrative:    Pt is discharging to CIR today. CM signing off.   Final next level of care: IP Rehab Facility Barriers to Discharge: No Barriers Identified   Patient Goals and CMS Choice Patient states their goals for this hospitalization and ongoing recovery are:: patient unable to participate in goal setting due to disorientation CMS Medicare.gov Compare Post Acute Care list provided to:: Patient Represenative (must comment) Choice offered to / list presented to : Adult Children, Spouse  Discharge Placement                       Discharge Plan and Services     Post Acute Care Choice: IP Rehab                               Social Determinants of Health (SDOH) Interventions     Readmission Risk Interventions No flowsheet data found.

## 2020-04-11 NOTE — Progress Notes (Signed)
Patient arrived to unit A/Ox4. Patient was accompanied by family. Fall patient safety prevention plan, visitors policy, and plan of care have been reviewed with patient and family. Denies pains upon arrival. Oriented to room and unit. No complaints or concerns to report at this time. Leane Para, LPN

## 2020-04-11 NOTE — Progress Notes (Signed)
Inpatient Rehabilitation Admissions Coordinator  I have received approval for inpt rehab via appeal with [facility] Medicare. I have notified Dr. Roda Shutters, daughter, spouse and patient. LOOP recorder to be placed today prior to admit as planned. I will make the arrangements to admit today.  Ottie Glazier, RN, MSN Rehab Admissions Coordinator [phone] 04/11/2020 10:43 AM

## 2020-04-11 NOTE — Progress Notes (Signed)
Inpatient Rehabilitation Medication Review by a Pharmacist  A complete drug regimen review was completed for this patient to identify any potential clinically significant medication issues.  Clinically significant medication issues were identified:  yes  For non-urgent medication issues to be resolved on team rounds tomorrow morning a [facility] Secure Chat Handoff was sent to: Dr. Wynn Banker   Pharmacist comments: Home Synthroid not resumed / Antibiotic (cefazolin) not resumed  Time spent performing this drug regimen review (minutes): 15   Elwin Sleight 04/11/2020 6:36 PM

## 2020-04-11 NOTE — Plan of Care (Signed)
  Problem: RH BOWEL ELIMINATION Goal: RH STG MANAGE BOWEL WITH ASSISTANCE Description: STG Manage Bowel with Assistance. Outcome: Progressing   Problem: RH BLADDER ELIMINATION Goal: RH STG MANAGE BLADDER WITH ASSISTANCE Description: STG Manage Bladder With Assistance Outcome: Progressing   Problem: RH SKIN INTEGRITY Goal: RH STG MAINTAIN SKIN INTEGRITY WITH ASSISTANCE Description: STG Maintain Skin Integrity With Assistance. Outcome: Progressing Goal: RH STG ABLE TO PERFORM INCISION/WOUND CARE W/ASSISTANCE Description: STG Able To Perform Incision/Wound Care With Assistance. Outcome: Progressing   Problem: RH SAFETY Goal: RH STG ADHERE TO SAFETY PRECAUTIONS W/ASSISTANCE/DEVICE Description: STG Adhere to Safety Precautions With Assistance/Device. Outcome: Progressing   Problem: RH COGNITION-NURSING Goal: RH STG ANTICIPATES NEEDS/CALLS FOR ASSIST W/ASSIST/CUES Description: STG Anticipates Needs/Calls for Assist With Assistance/Cues. Outcome: Progressing

## 2020-04-11 NOTE — Progress Notes (Signed)
Meredith Ribas, MD  Physician  Physical Medicine and Rehabilitation  Consult Note     Signed  Date of Service:  03/27/2020 12:15 PM      Related encounter: ED to Hosp-Admission (Current) from 03/23/2020 in [facility]      Signed      Expand All Collapse All  Show:Clear all [x] Manual[x] Template[] Copied  Added by: [x] Love, Pamela S, PA-C[x] Raulkar, Arlice Colt, MD  [] Hover for details          Physical Medicine and Rehabilitation Consult     Reason for Consult: Stroke with functional deficits Referring Physician: Dr. Clide Deutscher     HPI: SAFARI CINQUE is a 74 y.o. female with history of T2DM, HTN with retinopathy  who was admitted on 03/23/20 with slurred speech and left sided weakness. CT head negative and tPA administered. CTA angio revealed distal R-MCA M1 occlusion with moderate collateral flow in R-MCA. She underwent cerebral angio with revascularization of R-MCA M1 segment by Dr. 04/05/20.  She tolerated extubation without difficulty but later that evening had coffee ground emesis with fever and worsening of MS. Due to concerns of urosepsis, she was started on IV  Meropenum. Repeat CT head showed progresive R-MCA infarct with evidence of reocclusion. 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  BLE dopplers were negative for DVT.  2D echo showed EF 70-75% with possible echodensity attached to posterior mitral valve.  TEE ordered for work up and was negative for vegetations or thrombus and bubble study negative.    EEG ordered due to lethargy and showed sharp waves in right frontal region and slow, generalized lateralization in right hemisphere due to underlying  infarct.  On DAPT for embolic stroke of unknown origin and plans for loop recorder in the future.  Lethargy resolving with improvement in mentation--for MBS. She continues to have limitations due to left hemiparesis with ataxia,  L-HH with right gaze preference and signs of dysphagia. CIR recommended due to functional decline.     Review of Systems  Constitutional: Negative for chills and fever.  HENT: Negative for hearing loss and tinnitus.   Eyes: Negative for blurred vision and double vision.  Respiratory: Negative for cough and shortness of breath.   Cardiovascular: Negative for chest pain and palpitations.  Gastrointestinal: Negative for constipation, heartburn and nausea.  Genitourinary: Negative for dysuria and urgency.  Musculoskeletal: Negative for joint pain and myalgias.  Skin: Negative for rash.  Neurological: Positive for focal weakness. Negative for dizziness and headaches.  Psychiatric/Behavioral: Negative for depression. The patient is not nervous/anxious.       Past Medical History:  Diagnosis Date  . Arthritis      OA AND PAIN BOTH KNEES -RIGHT KNEE HURTS WORSE THAN LEFT  . Cataract    . Diabetes mellitus      ORAL MEDICATION - NO INSULIN  . Hypertension    . Hypertensive retinopathy      OU  . Hypothyroidism    . Kidney stones      NONE AT PRESENT TIME THAT PT IS AWARE OF  . Osteoporosis             Past Surgical History:  Procedure Laterality Date  . ABDOMINAL HYSTERECTOMY      .  CATARACT EXTRACTION      . CHOLECYSTECTOMY   1972  . COLONOSCOPY      . EYE SURGERY        RIGHT CATARACT EXTRACTON WITH LENS IMPLANT  . GROWTH REMOVED FROM THYROID      . INTRAMEDULLARY (IM) NAIL INTERTROCHANTERIC Left 02/10/2018    Procedure: INTRAMEDULLARY (IM) NAIL INTERTROCHANTRIC;  Surgeon: Mcarthur Rossetti, MD;  Location: [facility];  Service: Orthopedics;  Laterality: Left;  . INTRAMEDULLARY (IM) NAIL INTERTROCHANTERIC Right 08/20/2018    Procedure: ORIF RIGHT HIP  INTERTROC FRACTURE;  Surgeon: Mcarthur Rossetti, MD;  Location: [facility];  Service: Orthopedics;  Laterality: Right;  . IR CT HEAD LTD   03/24/2020  . IR PERCUTANEOUS ART THROMBECTOMY/INFUSION INTRACRANIAL INC DIAG ANGIO   03/24/2020  . left hip surgery    02/2018  . ORIF HIP FRACTURE Right 08/20/2018  . PERCUTANEOUS NEPHROLITHOTOMY      . RADIOLOGY WITH ANESTHESIA N/A 03/23/2020    Procedure: IR WITH ANESTHESIA;  Surgeon: Luanne Bras, MD;  Location: [facility];  Service: Radiology;  Laterality: N/A;  . TOTAL KNEE ARTHROPLASTY   06/09/2012    Procedure: TOTAL KNEE ARTHROPLASTY;  Surgeon: Mcarthur Rossetti, MD;  Location: [facility];  Service: Orthopedics;  Laterality: Right;  Right Total Knee Arthroplasty  . TOTAL KNEE ARTHROPLASTY Left 04/09/2014    Procedure: LEFT TOTAL KNEE ARTHROPLASTY;  Surgeon: Mcarthur Rossetti, MD;  Location: [facility];  Service: Orthopedics;  Laterality: Left;  . TUBAL LIGATION               Family History  Problem Relation Age of Onset  . Heart disease Mother    . Breast cancer Mother          64's  . Stroke Father    . Heart disease Father    . Healthy Daughter    . Healthy Son    . Liver disease Maternal Grandmother          gall stones imbedded in liver - blead to death when in surgery   . Pneumonia Maternal Grandfather    . Diabetes Paternal Grandmother      Social History:  reports that she has never smoked. She has never used smokeless tobacco. She reports that she does not drink alcohol and does not use drugs. Allergies:       Allergies  Allergen Reactions  . Semaglutide Nausea And Vomiting      PO Rybelsus  . Farxiga [Dapagliflozin]        Yeast infections  . Penicillins Itching and Rash      Has tolerated Cefazlin and Ceftriaxone in past              Facility-Administered Medications Prior to Admission  Medication Dose Route Frequency Provider Last Rate Last Admin  . Bevacizumab (AVASTIN) SOLN 1.25 mg  1.25 mg Intravitreal   Bernarda Caffey, MD   1.25 mg at 12/08/18 1150  . denosumab (PROLIA) injection 60 mg  60 mg Subcutaneous Q6 months Baruch Gouty, FNP   60 mg at 05/17/19 1313    Medications Prior to Admission  Medication Sig Dispense Refill  . amLODipine (NORVASC) 5 MG tablet TAKE ONE (1) TABLET EACH DAY (Patient taking differently: Take 5 mg by mouth daily. ) 90 tablet 0  . aspirin EC 81 MG tablet Take 81 mg by mouth daily.      . cholecalciferol (VITAMIN D) 1000 units tablet Take 5,000 Units by mouth daily.       Marland Kitchen  denosumab (PROLIA) 60 MG/ML SOSY injection Inject 60 mg into the skin every 6 (six) months. 1 mL 0  . Dulaglutide (TRULICITY) 4.19 QQ/2.2LN SOPN Inject 0.75 mg into the skin once a week.       Marland Kitchen glimepiride (AMARYL) 1 MG tablet Take 1 mg by mouth daily with breakfast.       . ibuprofen (ADVIL) 200 MG tablet Take 400 mg by mouth 2 (two) times daily as needed for moderate pain.       Marland Kitchen levothyroxine (SYNTHROID) 125 MCG tablet Take 1 tablet (125 mcg total) by mouth daily. 90 tablet 3  . lisinopril (ZESTRIL) 20 MG tablet Take 1 tablet (20 mg total) by mouth daily. 90 tablet 1  . metFORMIN (GLUCOPHAGE) 500 MG tablet Take 1 tablet (500 mg total) by mouth 2 (two) times daily with a meal. (Patient taking differently: Take 500 mg by mouth in the morning and at bedtime. ) 180 tablet 2  . metoprolol tartrate (LOPRESSOR) 100 MG tablet TAKE ONE TABLET BY MOUTH TWICE DAILY (Patient taking differently: Take 100 mg by mouth 2 (two) times daily. ) 180 tablet 0  . naproxen sodium (ALEVE) 220 MG tablet Take 220 mg by mouth 2 (two) times daily as needed (for pain).      . Omega-3 Fatty Acids (FISH OIL PO) Take 1 capsule by mouth daily.      . pravastatin (PRAVACHOL) 40 MG tablet Take 1 tablet (40 mg total) by mouth at bedtime. 90 tablet 2  . Blood Glucose Monitoring Suppl (ONETOUCH VERIO) w/Device KIT 1 each by Does not apply route daily. 1 kit 0  . glucose blood (ONETOUCH VERIO) test strip Use as instructed 100 each 12  .  Lancets (ONETOUCH ULTRASOFT) lancets Use as instructed 100 each 12      Home: Home Living Family/patient expects to be discharged to:: Private residence Living Arrangements: Spouse/significant other Available Help at Discharge: Family, Available 24 hours/day Type of Home: House Home Access: Stairs to enter Technical brewer of Steps: 2 Entrance Stairs-Rails: None Home Layout: One level Bathroom Shower/Tub: Multimedia programmer: Handicapped height Home Equipment: Environmental consultant - 2 wheels, Bedside commode, Shower seat, Cane - single point, Hand held shower head, Grab bars - toilet, Grab bars - tub/shower, Wheelchair - manual Additional Comments: DME info obtained from prior admission, needs to be confirmed  Functional History: Prior Function Level of Independence: Needs assistance Gait / Transfers Assistance Needed: pt reports ambulating without assistive device ADL's / Homemaking Assistance Needed: pt requires assistance to don socks Functional Status:  Mobility: Bed Mobility Overal bed mobility: Needs Assistance Bed Mobility: Supine to Sit, Sit to Supine Supine to sit: Max assist, HOB elevated, +2 for physical assistance Sit to supine: Max assist, +2 for physical assistance, HOB elevated Transfers Overall transfer level: Needs assistance Equipment used: 1 person hand held assist, 2 person hand held assist Transfers: Sit to/from Stand Sit to Stand: Max assist, +2 physical assistance, From elevated surface General transfer comment: pt performs 1 sit to stand with maxA and L knee block, 2nd sit to stand with maxA x2   ADL: ADL Overall ADL's : Needs assistance/impaired Eating/Feeding: NPO Eating/Feeding Details (indicate cue type and reason): coretrak Grooming: Maximal assistance, Brushing hair, Sitting Grooming Details (indicate cue type and reason): able to brush small section on R side of hair while seated EOB. Difficulty crossing midline to L without assist, cannot  reach back Upper Body Bathing: Maximal assistance, Sitting Lower Body Bathing: Total assistance, Sitting/lateral  leans, Sit to/from stand Upper Body Dressing : Maximal assistance, Sitting Lower Body Dressing: Total assistance, Sitting/lateral leans, Sit to/from stand Lower Body Dressing Details (indicate cue type and reason): recieves LB dressing assist at baseline Toilet Transfer: Moderate assistance, +2 for physical assistance, +2 for safety/equipment Toileting- Clothing Manipulation and Hygiene: Maximal assistance Functional mobility during ADLs: Maximal assistance, +2 for physical assistance, +2 for safety/equipment   Cognition: Cognition Overall Cognitive Status: Impaired/Different from baseline Orientation Level: Oriented X4 Cognition Arousal/Alertness: Awake/alert Behavior During Therapy: WFL for tasks assessed/performed Overall Cognitive Status: Impaired/Different from baseline Area of Impairment: Attention, Memory, Following commands, Safety/judgement, Awareness, Problem solving Current Attention Level: Sustained Memory: Decreased recall of precautions, Decreased short-term memory Following Commands: Follows one step commands consistently, Follows one step commands with increased time Safety/Judgement: Decreased awareness of safety, Decreased awareness of deficits Awareness: Intellectual Problem Solving: Slow processing, Requires verbal cues General Comments: pt with poor awareness into current deficits, needs frequent cues for safety. Difficulty following one step commands without multimodal cues and redirection   Blood pressure (!) 145/83, pulse 98, temperature 98.3 F (36.8 C), temperature source Oral, resp. rate (!) 26, weight 81.6 kg, SpO2 97 %. Physical Exam    General: Alert and oriented x 3, No apparent distress HEENT: Head is normocephalic, atraumatic, Left sided neglect, sclera anicteric, oral mucosa pink and moist, dentition intact, ext ear canals clear, NGT in  place.  Neck: Supple without JVD or lymphadenopathy Heart: Reg rate and rhythm. No murmurs rubs or gallops Chest: CTA bilaterally without wheezes, rales, or rhonchi; no distress Abdomen: Soft, non-tender, non-distended, bowel sounds positive. Extremities: No clubbing, cyanosis, or edema. Pulses are 2+ Skin: Clean and intact without signs of breakdown Neuro: Pt is cognitively appropriate with normal insight, memory, and awareness. Slow speech, fatigued. Left sided neglect. Cranial nerves 2-12 are intact. Decreased sensation in left lower extremity. 5/5 strength on right side. 0/5 right SA, 2/5 EF, 1/5 EE, 0/5 WF and hand grip. LLE: 2/5 HF, KE, 0/5 DF/PF.  Musculoskeletal: Full ROM, No pain with AROM or PROM in the neck, trunk, or extremities. Posture appropriate Psych: Pt's affect is appropriate. Pt is cooperative     Lab Results Last 24 Hours       Results for orders placed or performed during the hospital encounter of 03/23/20 (from the past 24 hour(s))  Glucose, capillary     Status: Abnormal    Collection Time: 03/26/20  3:19 PM  Result Value Ref Range    Glucose-Capillary 131 (H) 70 - 99 mg/dL    Comment 1 Notify RN      Comment 2 Document in Chart    Glucose, capillary     Status: Abnormal    Collection Time: 03/26/20  7:20 PM  Result Value Ref Range    Glucose-Capillary 119 (H) 70 - 99 mg/dL  Glucose, capillary     Status: Abnormal    Collection Time: 03/26/20 11:22 PM  Result Value Ref Range    Glucose-Capillary 156 (H) 70 - 99 mg/dL  Glucose, capillary     Status: Abnormal    Collection Time: 03/27/20  3:14 AM  Result Value Ref Range    Glucose-Capillary 223 (H) 70 - 99 mg/dL  CBC     Status: Abnormal    Collection Time: 03/27/20  7:07 AM  Result Value Ref Range    WBC 9.2 4.0 - 10.5 K/uL    RBC 3.54 (L) 3.87 - 5.11 MIL/uL    Hemoglobin 10.4 (L) 12.0 -  15.0 g/dL    HCT 31.3 (L) 36 - 46 %    MCV 88.4 80.0 - 100.0 fL    MCH 29.4 26.0 - 34.0 pg    MCHC 33.2 30.0 - 36.0  g/dL    RDW 13.5 11.5 - 15.5 %    Platelets 207 150 - 400 K/uL    nRBC 0.0 0.0 - 0.2 %  Comprehensive metabolic panel     Status: Abnormal    Collection Time: 03/27/20  7:07 AM  Result Value Ref Range    Sodium 140 135 - 145 mmol/L    Potassium 3.4 (L) 3.5 - 5.1 mmol/L    Chloride 110 98 - 111 mmol/L    CO2 21 (L) 22 - 32 mmol/L    Glucose, Bld 238 (H) 70 - 99 mg/dL    BUN 24 (H) 8 - 23 mg/dL    Creatinine, Ser 1.23 (H) 0.44 - 1.00 mg/dL    Calcium 7.9 (L) 8.9 - 10.3 mg/dL    Total Protein 5.7 (L) 6.5 - 8.1 g/dL    Albumin 2.3 (L) 3.5 - 5.0 g/dL    AST 27 15 - 41 U/L    ALT 14 0 - 44 U/L    Alkaline Phosphatase 58 38 - 126 U/L    Total Bilirubin 0.7 0.3 - 1.2 mg/dL    GFR calc non Af Amer 43 (L) >60 mL/min    GFR calc Af Amer 50 (L) >60 mL/min    Anion gap 9 5 - 15  Phosphorus     Status: Abnormal    Collection Time: 03/27/20  7:07 AM  Result Value Ref Range    Phosphorus 2.2 (L) 2.5 - 4.6 mg/dL  Magnesium     Status: None    Collection Time: 03/27/20  7:07 AM  Result Value Ref Range    Magnesium 2.1 1.7 - 2.4 mg/dL  Triglycerides     Status: Abnormal    Collection Time: 03/27/20  7:07 AM  Result Value Ref Range    Triglycerides 247 (H) <150 mg/dL  Glucose, capillary     Status: Abnormal    Collection Time: 03/27/20  7:44 AM  Result Value Ref Range    Glucose-Capillary 238 (H) 70 - 99 mg/dL  Glucose, capillary     Status: Abnormal    Collection Time: 03/27/20 11:22 AM  Result Value Ref Range    Glucose-Capillary 318 (H) 70 - 99 mg/dL       Imaging Results (Last 48 hours)  MR ANGIO HEAD WO CONTRAST   Result Date: 03/25/2020 CLINICAL DATA:  Follow-up examination for acute stroke. EXAM: MRA HEAD WITHOUT CONTRAST TECHNIQUE: Angiographic images of the Circle of Willis were obtained using MRA technique without intravenous contrast. COMPARISON:  Prior brain MRI from earlier the same day as well as recent CTA from 03/23/2020. FINDINGS: ANTERIOR CIRCULATION: Examination  degraded by motion artifact, limiting assessment. Distal cervical segments of the internal carotid arteries are widely patent with symmetric antegrade flow. Petrous, cavernous, and supraclinoid ICAs patent without appreciable stenosis or other abnormality. A1 segments patent bilaterally. Normal anterior communicating artery complex. Anterior cerebral arteries remain widely patent to their distal aspects without stenosis. Right M1 segment patent proximally, but poorly visualized at its mid and distal aspect, and suspected to be reoccluded since prior catheter directed intervention. Right MCA bifurcation not well seen. Few small attenuated branches remain patent within the distal right MCA distribution, relatively stable from initial CTA. Left M1 segment widely patent. Normal  left MCA bifurcation. Distal left MCA branches well perfused. POSTERIOR CIRCULATION: Vertebral arteries patent to the vertebrobasilar junction without stenosis. Patent right PICA. Left PICA not seen. Basilar patent to its distal aspect without stenosis. Superior cerebral arteries grossly patent proximally. Left PCA supplied via the basilar. Predominant fetal type origin of the right PCA. PCAs remain patent to their distal aspects without stenosis. No appreciable intracranial aneurysm. IMPRESSION: 1. Technically limited exam due to motion artifact. 2. Probable reocclusion of the distal right M1 segment since recent catheter directed revascularization. Few attenuated small vessels remain patent within the right MCA distribution distally. Overall, appearance is similar as compared to initial presenting CTA. 3. No other intracranial large vessel occlusion or high-grade stenosis. Electronically Signed   By: Jeannine Boga M.D.   On: 03/25/2020 21:08    MR BRAIN WO CONTRAST   Result Date: 03/25/2020 CLINICAL DATA:  Follow-up examination for acute stroke. EXAM: MRI HEAD WITHOUT CONTRAST TECHNIQUE: Multiplanar, multiecho pulse sequences of the  brain and surrounding structures were obtained without intravenous contrast. COMPARISON:  Prior head CT from 03/24/2013 as well as previous studies. FINDINGS: Brain: Examination moderately degraded by motion artifact. Generalized age-related cerebral atrophy. Patchy T2/FLAIR hyperintensity within the periventricular and deep white matter both cerebral hemispheres as well as the pons, most consistent with chronic microvascular ischemic disease. Few scatter remote lacunar infarcts noted about the bilateral basal ganglia and thalami. Small remote right cerebellar infarct with associated chronic hemosiderin staining noted. Large confluent area of restricted diffusion seen involving the majority of the right MCA distribution, consistent with large acute right MCA territory infarct. Associated gyral swelling and edema throughout the area of infarction. Regional mass effect without significant right-to-left midline shift. Basilar cisterns remain widely patent. Associated petechial hemorrhage throughout the area of infarction without frank hemorrhagic transformation. Additional scattered patchy small volume foci of acute ischemia seen involving the right PCA territory, with involvement of the splenium, right thalamus, and right temporal occipital region. This is likely based on the predominant fetal type origin right PCA seen on corresponding CTA and MRA. Additional punctate focus of acute ischemia seen at the posterior left frontal periventricular white matter (series 2, image 25). No mass lesion or hydrocephalus. No extra-axial fluid collection. Pituitary gland suprasellar region within normal limits. Midline structures intact. Vascular: Loss of normal flow void within the distal right M1 segment extending into right M2 branches at the sylvian fissure. Associated susceptibility artifact seen on SWI sequence (series 7, image 33), consistent with thrombus. Finding consistent with reocclusion of previously revascularized  right MCA, also seen on corresponding MRA. Major intracranial vascular flow voids otherwise maintained. Skull and upper cervical spine: Craniocervical junction within normal limits. Bone marrow signal intensity normal. No scalp soft tissue abnormality. Sinuses/Orbits: Patient status post bilateral ocular lens replacement. Globes and orbital soft tissues demonstrate no acute finding. Paranasal sinuses are largely clear. No significant mastoid effusion. Other: None. IMPRESSION: 1. 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 Associated petechial hemorrhage without frank hemorrhagic transformation. 2. Additional scattered small volume acute ischemic nonhemorrhagic right PCA territory infarcts, likely based on the fetal type origin right PCA as seen on corresponding MRA and CTA. 3. Additional punctate focus of acute ischemia involving the posterior left frontal periventricular white matter. 4. Loss of normal flow void with susceptibility artifact involving distal right M1/M2 branch, consistent with reocclusion. Finding consistent with findings seen on corresponding MRA. 5. Underlying age-related cerebral atrophy with chronic microvascular ischemic disease, with a few scattered remote  lacunar infarcts involving the bilateral basal ganglia and thalami. Electronically Signed   By: Jeannine Boga M.D.   On: 03/25/2020 21:28    DG CHEST PORT 1 VIEW   Result Date: 03/26/2020 CLINICAL DATA:  Code stroke EXAM: PORTABLE CHEST 1 VIEW COMPARISON:  03/25/2020 FINDINGS: Cardiomediastinal contours and hilar structures are stable with central pulmonary vascular engorgement, heart size accentuated by low volumes and portable technique. No consolidation. No pleural effusion. Suspect subsegmental RIGHT lower lobe atelectasis as before. Visualized skeletal structures are unremarkable. IMPRESSION: No acute cardiopulmonary disease. Mild RIGHT basilar atelectasis similar to prior studies. Electronically  Signed   By: Zetta Bills M.D.   On: 03/26/2020 08:32    EEG adult   Result Date: 03/25/2020 Lora Havens, MD     03/25/2020  1:44 PM Patient Name: AZZURE GARABEDIAN MRN: [id number] Epilepsy Attending: Lora Havens Referring Physician/Provider: Dr. Rosalin Hawking Date: 03/25/2020 Duration: 25.29 minutes Patient history: 74 year old female presenting with slurred speech, left-sided weakness and ataxia.  MRI brain showed right MCA infarct.  EEG evaluate for seizures. Level of alertness: Awake, asleep AEDs during EEG study: None Technical aspects: This EEG study was done with scalp electrodes positioned according to the 10-20 International system of electrode placement. Electrical activity was acquired at a sampling rate of 500Hz  and reviewed with a high frequency filter of 70Hz  and a low frequency filter of 1Hz . EEG data were recorded continuously and digitally stored. Description: During awake state, no clear posterior dominant rhythm was seen.  Sleep was characterized by vertex, maximal frontocentral region.  EEG showed continuous generalized and lateralized right hemisphere 3 to 6 Hz theta-delta slowing.  Sharp waves were also seen in right frontal region which appeared rhythmic without any clear evolution when patient was stimulated at the end of the study.  Hyperventilation and photic stimulation were not performed.   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  The sharp waves appeared rhythmic when patient was stimulated without any clear evolution and is more likely related to arousal.  No definite seizures were seen during the study.Lora Havens    ECHO TEE   Result Date: 03/26/2020    TRANSESOPHOGEAL ECHO REPORT   Patient Name:   AISLING EMIGH Date of Exam: 03/26/2020 Medical Rec #:  [id number]        Height:       67.0 in Accession #:    [id number]       Weight:       181.7 lb Date of Birth:  Jun 10, 1946        BSA:          1.941 m Patient Age:    74 years         BP:           163/82 mmHg Patient Gender: F                HR:           92 bpm. Exam Location:  Inpatient Procedure: 2D Echo, Cardiac Doppler, Color Doppler and Saline Contrast Bubble            Study Indications:     Stroke  History:         Patient has prior history of Echocardiogram examinations, most  recent 03/24/2020. Risk Factors:Hypertension and Diabetes. CKD.  Sonographer:     Clayton Lefort RDCS (AE) Referring Phys:  [place] Diagnosing Phys: Ena Dawley MD PROCEDURE: After discussion of the risks and benefits of a TEE, an informed consent was obtained from the patient. The transesophogeal probe was passed without difficulty through the esophogus of the patient. Sedation performed by different physician. The patient was monitored while under deep sedation. Anesthestetic sedation was provided intravenously by Anesthesiology: 207.41m of Propofol. Image quality was adequate. The patient's vital signs; including heart rate, blood pressure, and oxygen saturation; remained stable throughout the procedure. The patient developed no complications during the procedure. IMPRESSIONS  1. No mass or vegetations were identified on the mitral or other valves. No thrombus in the left atrial appendage. Negative bubble study.  2. Left ventricular ejection fraction, by estimation, is 70 to 75%. The left ventricle has hyperdynamic function. The left ventricle has no regional wall motion abnormalities. There is mild concentric left ventricular hypertrophy. Left ventricular diastolic function could not be evaluated.  3. Right ventricular systolic function is normal. The right ventricular size is normal.  4. Left atrial size was mildly dilated. No left atrial/left atrial  appendage thrombus was detected. The LAA emptying velocity was 60 cm/s.  5. The mitral valve is normal in structure. Mild mitral valve regurgitation. No evidence of mitral stenosis.  6. The aortic valve is normal in structure. Aortic valve regurgitation is mild. No aortic stenosis is present.  7. There is mild (Grade II) plaque.  8. The inferior vena cava is normal in size with greater than 50% respiratory variability, suggesting right atrial pressure of 3 mmHg.  9. Agitated saline contrast bubble study was negative, with no evidence of any interatrial shunt. Conclusion(s)/Recommendation(s): No LA/LAA thrombus identified. Negative bubble study for interatrial shunt. No intracardiac source of embolism detected on this on this transesophageal echocardiogram. FINDINGS  Left Ventricle: Left ventricular ejection fraction, by estimation, is 70 to 75%. The left ventricle has hyperdynamic function. The left ventricle has no regional wall motion abnormalities. The left ventricular internal cavity size was normal in size. There is mild concentric left ventricular hypertrophy. Left ventricular diastolic function could not be evaluated. Right Ventricle: The right ventricular size is normal. No increase in right ventricular wall thickness. Right ventricular systolic function is normal. Left Atrium: Left atrial size was mildly dilated. No left atrial/left atrial appendage thrombus was detected. The LAA emptying velocity was 60 cm/s. Right Atrium: Right atrial size was normal in size. Pericardium: There is no evidence of pericardial effusion. Mitral Valve: The mitral valve is normal in structure. Normal mobility of the mitral valve leaflets. Mild mitral valve regurgitation. No evidence of mitral valve stenosis. There is no evidence of mitral valve vegetation. Tricuspid Valve: The tricuspid valve is normal in structure. Tricuspid valve regurgitation is mild . No evidence of tricuspid stenosis. There is no evidence of tricuspid  valve vegetation. Aortic Valve: The aortic valve is normal in structure. Aortic valve regurgitation is mild. No aortic stenosis is present. There is no evidence of aortic valve vegetation. Pulmonic Valve: The pulmonic valve was normal in structure. Pulmonic valve regurgitation is not visualized. No evidence of pulmonic stenosis. There is no evidence of pulmonic valve vegetation. Aorta: The aortic root is normal in size and structure. There is mild (Grade II) plaque. Venous: The inferior vena cava is normal in size with greater than 50% respiratory variability, suggesting right atrial pressure of 3 mmHg. IAS/Shunts: No atrial level  shunt detected by color flow Doppler. Agitated saline contrast was given intravenously to evaluate for intracardiac shunting. Agitated saline contrast bubble study was negative, with no evidence of any interatrial shunt. Ena Dawley MD Electronically signed by Ena Dawley MD Signature Date/Time: 03/26/2020/3:24:18 PM    Final        Assessment/Plan: Diagnosis: R MCA infarct s/p tPA.  1. Does the need for close, 24 hr/day medical supervision in concert with the patient's rehab needs make it unreasonable for this patient to be served in a less intensive setting? Yes 2. Co-Morbidities requiring supervision/potential complications: HTN, HLD, Left sided weakness, ataxia, dysphagia 3. Due to bladder management, bowel management, safety, skin/wound care, disease management, medication administration, pain management and patient education, does the patient require 24 hr/day rehab nursing? Yes 4. Does the patient require coordinated care of a physician, rehab nurse, therapy disciplines of PT, OT, SLP to address physical and functional deficits in the context of the above medical diagnosis(es)? Yes Addressing deficits in the following areas: balance, endurance, locomotion, strength, transferring, bowel/bladder control and bathing 5. Can the patient actively participate in an  intensive therapy program of at least 3 hrs of therapy per day at least 5 days per week? Yes 6. The potential for patient to make measurable gains while on inpatient rehab is excellent 7. Anticipated functional outcomes upon discharge from inpatient rehab are min assist  with PT, min assist with OT, supervision with SLP. 8. Estimated rehab length of stay to reach the above functional goals is: 16-20 days 9. Anticipated discharge destination: Home 10. Overall Rehab/Functional Prognosis: excellent   RECOMMENDATIONS: This patient's condition is appropriate for continued rehabilitative care in the following setting: CIR Patient has agreed to participate in recommended program. Yes Note that insurance prior authorization may be required for reimbursement for recommended care.   Comment: Mrs. Guthridge would be an excellent CIR candidate. She lives with her husband. Her son lives very close by and her daughter is also a great source of support. Thank you for this consult. We will continue to follow in Mrs. Jarvie's care.    Bary Leriche, PA-C 03/27/2020    I have personally performed a face to face diagnostic evaluation, including, but not limited to relevant history and physical exam findings, of this patient and developed relevant assessment and plan.  Additionally, I have reviewed and concur with the physician assistant's documentation above.   The patient's status has not changed. The original post admission physician evaluation remains appropriate, and any changes from the pre-admission screening or documentation from the acute chart are noted above.    Leeroy Cha, MD        Revision History                     Routing History           Note Details  Author Ranell Patrick, Clide Deutscher, MD File Time 03/27/2020  9:02 PM  Author Type Physician Status Signed  Last Editor Meredith Ribas, MD Service Physical Medicine and Rehabilitation

## 2020-04-11 NOTE — Discharge Summary (Addendum)
Stroke Discharge Summary  Patient ID: Meredith Shaw   MRN: [id number]      DOB: 12-10-1945  Date of Admission: 03/23/2020 Date of Discharge: 04/11/2020  Attending Physician:  Rosalin Hawking, MD, Stroke MD Consultant(s):  Olene Craven) Estanislado Pandy, MD (Interventional Neuroradiologist), Tally Due MD (pulmonary/intensive care), Shelva Majestic MD (cardiology-cardioversion), Irine Seal MD (urology), Leeroy Cha, MD (Physical Medicine & Rehabilitation)   Patient's PCP:  Chevis Pretty, FNP  Discharge Diagnoses:  Principal Problem:   Acute ischemic right MCA stroke [facility]) s/p tPA & attempted revascularization, embolic stroke, source unknown Active Problems:   Hypertension associated with type 2 diabetes mellitus ([place])   Hematuria   Type 2 diabetes mellitus with complication, without long-term current use of insulin ([place])   Hyperlipidemia associated with type 2 diabetes mellitus ([place])   Middle cerebral artery embolism, right   AKI (acute kidney injury) ([place])   UTI (urinary tract infection), bacterial   Urinary tract infection with hematuria   Dysphagia due to recent cerebral infarction   Slow transit constipation   Dyslipidemia   Hemorrhagic cystitis   Benign essential HTN   Medications to be continued on Rehab Allergies as of 04/11/2020      Reactions   Semaglutide Nausea And Vomiting   PO Rybelsus   Farxiga [dapagliflozin]    Yeast infections   Penicillins Itching, Rash   Has tolerated Cefazlin and Ceftriaxone in past       Medication List    STOP taking these medications   cholecalciferol 1000 units tablet Commonly known as: VITAMIN D   denosumab 60 MG/ML Sosy injection Commonly known as: PROLIA   FISH OIL PO   glimepiride 1 MG tablet Commonly known as: AMARYL   glucose blood test strip Commonly known as: OneTouch Verio   ibuprofen 200 MG tablet Commonly known as: ADVIL   lisinopril 20 MG tablet Commonly known as: ZESTRIL   metFORMIN 500 MG  tablet Commonly known as: GLUCOPHAGE   naproxen sodium 220 MG tablet Commonly known as: ALEVE   onetouch ultrasoft lancets   OneTouch Verio w/Device Kit   Trulicity 3.50 KX/3.8HW Sopn Generic drug: Dulaglutide     TAKE these medications   acetaminophen 325 MG tablet Commonly known as: TYLENOL Take 2 tablets (650 mg total) by mouth every 4 (four) hours as needed for mild pain (or temp > 37.5 C (99.5 F)).   amantadine 50 MG/5ML solution Commonly known as: SYMMETREL Take 15 mLs (150 mg total) by mouth daily. Start taking on: April 12, 2020   amLODipine 10 MG tablet Commonly known as: NORVASC Take 1 tablet (10 mg total) by mouth daily. Start taking on: April 12, 2020 What changed:   medication strength  See the new instructions.   aspirin EC 81 MG tablet Take 81 mg by mouth daily.   bisacodyl 5 MG EC tablet Commonly known as: DULCOLAX Take 1 tablet (5 mg total) by mouth daily. Start taking on: April 12, 2020   ceFAZolin 1-4 GM/50ML-% Soln Commonly known as: ANCEF Inject 50 mLs (1 g total) into the vein every 8 (eight) hours for 12 doses.   Chlorhexidine Gluconate Cloth 2 % Pads Apply 6 each topically daily. Start taking on: April 12, 2020   ezetimibe 10 MG tablet Commonly known as: ZETIA Take 1 tablet (10 mg total) by mouth daily. Start taking on: April 12, 2020   heparin 5000 UNIT/ML injection Inject 1 mL (5,000 Units total) into the skin every 8 (eight) hours.  insulin aspart 100 UNIT/ML injection Commonly known as: novoLOG Inject 0-5 Units into the skin at bedtime.   insulin aspart 100 UNIT/ML injection Commonly known as: novoLOG Inject 0-9 Units into the skin 3 (three) times daily with meals.   insulin detemir 100 UNIT/ML injection Commonly known as: LEVEMIR Inject 0.1 mLs (10 Units total) into the skin 2 (two) times daily.   levothyroxine 125 MCG tablet Commonly known as: SYNTHROID Take 1 tablet (125 mcg total) by mouth daily.   metoprolol tartrate 25  MG tablet Commonly known as: LOPRESSOR Take 1 tablet (25 mg total) by mouth 2 (two) times daily. What changed:   medication strength  how much to take   mouth rinse Liqd solution 15 mLs by Mouth Rinse route 2 (two) times daily.   multivitamin with minerals Tabs tablet Take 1 tablet by mouth daily. Start taking on: April 12, 2020   pantoprazole 40 MG tablet Commonly known as: PROTONIX Take 1 tablet (40 mg total) by mouth daily. Start taking on: April 12, 2020   polyethylene glycol 17 g packet Commonly known as: MIRALAX / GLYCOLAX Take 17 g by mouth daily. Start taking on: April 12, 2020   pravastatin 40 MG tablet Commonly known as: PRAVACHOL Take 1 tablet (40 mg total) by mouth daily at 6 PM. What changed: when to take this   Resource ThickenUp Clear Powd Take 120 g by mouth as needed (nectar thick liquids).   sodium chloride 0.9 % infusion Inject 50 mLs into the vein continuous.       LABORATORY STUDIES CBC    Component Value Date/Time   WBC 10.4 04/11/2020 0529   RBC 3.54 (L) 04/11/2020 0529   HGB 10.4 (L) 04/11/2020 0529   HGB 13.6 11/15/2019 0859   HCT 32.1 (L) 04/11/2020 0529   HCT 39.1 11/15/2019 0859   PLT 348 04/11/2020 0529   PLT 233 11/15/2019 0859   MCV 90.7 04/11/2020 0529   MCV 91 11/15/2019 0859   MCH 29.4 04/11/2020 0529   MCHC 32.4 04/11/2020 0529   RDW 14.6 04/11/2020 0529   RDW 13.3 11/15/2019 0859   LYMPHSABS 1.2 03/24/2020 0450   LYMPHSABS 2.6 11/15/2019 0859   MONOABS 0.9 03/24/2020 0450   EOSABS 0.0 03/24/2020 0450   EOSABS 0.4 11/15/2019 0859   BASOSABS 0.0 03/24/2020 0450   BASOSABS 0.1 11/15/2019 0859   CMP    Component Value Date/Time   NA 138 04/11/2020 0529   NA 137 02/26/2020 1019   K 3.5 04/11/2020 0529   CL 110 04/11/2020 0529   CO2 20 (L) 04/11/2020 0529   GLUCOSE 210 (H) 04/11/2020 0529   BUN 16 04/11/2020 0529   BUN 43 (H) 02/26/2020 1019   CREATININE 1.18 (H) 04/11/2020 0529   CALCIUM 8.6 (L) 04/11/2020 0529    PROT 5.7 (L) 04/10/2020 0324   PROT 6.3 02/26/2020 1019   ALBUMIN 2.6 (L) 04/10/2020 0324   ALBUMIN 3.8 02/26/2020 1019   AST 30 04/10/2020 0324   ALT 34 04/10/2020 0324   ALKPHOS 78 04/10/2020 0324   BILITOT 0.4 04/10/2020 0324   BILITOT 0.3 02/26/2020 1019   GFRNONAA 46 (L) 04/11/2020 0529   GFRAA 53 (L) 04/11/2020 0529   COAGS Lab Results  Component Value Date   INR 1.1 03/26/2020   INR 1.1 03/24/2020   INR 1.0 03/23/2020   Lipid Panel    Component Value Date/Time   CHOL 156 03/24/2020 0450   CHOL 163 11/15/2019 0859   TRIG 247 (  H) 03/27/2020 0707   TRIG 456 (H) 07/03/2013 1504   HDL 27 (L) 03/24/2020 0450   HDL 33 (L) 11/15/2019 0859   HDL 36 (L) 07/03/2013 1504   CHOLHDL 5.8 03/24/2020 0450   VLDL UNABLE TO CALCULATE IF TRIGLYCERIDE OVER 400 mg/dL 03/24/2020 0450   LDLCALC UNABLE TO CALCULATE IF TRIGLYCERIDE OVER 400 mg/dL 03/24/2020 0450   LDLCALC 83 11/15/2019 0859   LDLCALC NOTES: 07/03/2013 1504   HgbA1C  Lab Results  Component Value Date   HGBA1C 8.9 (H) 03/24/2020   Urinalysis    Component Value Date/Time   COLORURINE AMBER (A) 04/05/2020 1315   APPEARANCEUR CLOUDY (A) 04/05/2020 1315   APPEARANCEUR Clear 12/31/2017 0829   LABSPEC 1.013 04/05/2020 1315   PHURINE 5.0 04/05/2020 1315   GLUCOSEU NEGATIVE 04/05/2020 1315   HGBUR LARGE (A) 04/05/2020 1315   BILIRUBINUR NEGATIVE 04/05/2020 1315   BILIRUBINUR Negative 12/31/2017 0829   KETONESUR NEGATIVE 04/05/2020 1315   PROTEINUR 100 (A) 04/05/2020 1315   UROBILINOGEN 0.2 04/02/2014 1424   NITRITE NEGATIVE 04/05/2020 1315   LEUKOCYTESUR MODERATE (A) 04/05/2020 1315    SIGNIFICANT DIAGNOSTIC STUDIES CT ANGIO HEAD W OR WO CONTRAST  Result Date: 03/23/2020 CLINICAL DATA:  Left arm weakness and facial droop EXAM: CT ANGIOGRAPHY HEAD AND NECK TECHNIQUE: Multidetector CT imaging of the head and neck was performed using the standard protocol during bolus administration of intravenous contrast. Multiplanar  CT image reconstructions and MIPs were obtained to evaluate the vascular anatomy. Carotid stenosis measurements (when applicable) are obtained utilizing NASCET criteria, using the distal internal carotid diameter as the denominator. CONTRAST:  76m OMNIPAQUE IOHEXOL 350 MG/ML SOLN COMPARISON:  None. FINDINGS: CTA NECK FINDINGS SKELETON: There is no bony spinal canal stenosis. No lytic or blastic lesion. OTHER NECK: Normal pharynx, larynx and major salivary glands. No cervical lymphadenopathy. Unremarkable thyroid gland. UPPER CHEST: No pneumothorax or pleural effusion. No nodules or masses. AORTIC ARCH: There is no calcific atherosclerosis of the aortic arch. There is no aneurysm, dissection or hemodynamically significant stenosis of the visualized portion of the aorta. Conventional 3 vessel aortic branching pattern. The visualized proximal subclavian arteries are widely patent. RIGHT CAROTID SYSTEM: Normal without aneurysm, dissection or stenosis. LEFT CAROTID SYSTEM: Normal without aneurysm, dissection or stenosis. VERTEBRAL ARTERIES: Left dominant configuration. Both origins are clearly patent. There is no dissection, occlusion or flow-limiting stenosis to the skull base (V1-V3 segments). CTA HEAD FINDINGS POSTERIOR CIRCULATION: --Vertebral arteries: Normal V4 segments. --Inferior cerebellar arteries: Normal. --Basilar artery: Normal. --Superior cerebellar arteries: Normal. --Posterior cerebral arteries (PCA): Normal. ANTERIOR CIRCULATION: --Intracranial internal carotid arteries: Normal. --Anterior cerebral arteries (ACA): Normal. Both A1 segments are present. Patent anterior communicating artery (a-comm). --Middle cerebral arteries (MCA): There is a distal right MCA M1 segment occlusion. Left MCA is normal. VENOUS SINUSES: As permitted by contrast timing, patent. ANATOMIC VARIANTS: Fetal origin of the right posterior cerebral artery. There is moderate collateral flow in the right MCA territory. Review of the  MIP images confirms the above findings. IMPRESSION: 1. Distal right MCA M1 segment occlusion with moderate collateral flow in the right MCA territory. 2. No other intracranial arterial occlusion or high-grade stenosis. 3. Normal cervical carotid and vertebral arteries. Critical Value/emergent results were called by telephone at the time of interpretation on 03/23/2020 at 11:13 pm to provider SSamara Snide, who verbally acknowledged these results. Electronically Signed   By: KUlyses JarredM.D.   On: 03/23/2020 23:17   DG Abd 1 View  Result Date: 03/24/2020 CLINICAL  DATA:  Vomiting. EXAM: ABDOMEN - 1 VIEW COMPARISON:  Abdominal x-ray dated September 25, 2015. FINDINGS: The bowel gas pattern is normal. No radio-opaque calculi or other significant radiographic abnormality are seen. Excreted contrast in the renal collecting systems, ureters, and bladder. Moderate right and mild left hydroureteronephrosis. No acute osseous abnormality. IMPRESSION: 1. No obstruction. 2. Moderate right and mild left hydroureteronephrosis. Electronically Signed   By: Titus Dubin M.D.   On: 03/24/2020 16:42   CT HEAD WO CONTRAST  Result Date: 03/24/2020 CLINICAL DATA:  Stroke follow-up. Encephalopathy. Right MCA thrombectomy 03/24/2020 EXAM: CT HEAD WITHOUT CONTRAST TECHNIQUE: Contiguous axial images were obtained from the base of the skull through the vertex without intravenous contrast. COMPARISON:  CT head 03/24/2020 at 1201 hours. CT head 03/24/2020 at 0224 hours. FINDINGS: Brain: Progressive edema right MCA territory consistent with acute infarction. This involves the insula, superior temporal lobe, and frontal parietal cortex. No associated hemorrhage or midline shift. Low-density in the anterior frontal lobes bilaterally is symmetric and is felt to be due to artifact from motion and beam hardening. Low-density in the left frontal white matter compatible with chronic ischemia. Ventricle size normal. Vascular: Hyperdensity in  the left M2 branch in the sylvian fissure compatible with thrombosis. Negative for hyperdense M1 segment. Skull: No focal skeletal lesion. Sinuses/Orbits: Paranasal sinuses clear. Bilateral cataract extraction. Other: None IMPRESSION: Progressive low-density in the right MCA territory compatible with acute infarct. Sparing of the basal ganglia. No associated hemorrhage. Hyperdense right M2 segment sylvian fissure compatible with thrombosis. Electronically Signed   By: Franchot Gallo M.D.   On: 03/24/2020 21:59   CT HEAD WO CONTRAST  Result Date: 03/24/2020 CLINICAL DATA:  Stroke. Left arm weakness and facial droop. Right M1 occlusion yesterday with subsequent vascular intervention. EXAM: CT HEAD WITHOUT CONTRAST TECHNIQUE: Contiguous axial images were obtained from the base of the skull through the vertex without intravenous contrast. COMPARISON:  Head CT earlier same day FINDINGS: Brain: Developing low-density with loss of gray-white differentiation throughout the right middle cerebral artery territory affecting the insula, frontal operculum and right frontoparietal cortical and subcortical brain. Sparing of the basal ganglia. No evidence of hemorrhage or midline shift. Chronic small-vessel ischemic changes elsewhere affect the cerebral hemispheric white matter. Old right cerebellar infarction. No hydrocephalus. No extra-axial collection. Vascular: There is atherosclerotic calcification of the major vessels at the base of the brain. Skull: Negative Sinuses/Orbits: Clear/normal Other: None IMPRESSION: Changes of acute infarction now evident by CT affecting the right middle cerebral artery territory with sparing of the basal ganglia. No hemorrhage. No midline shift. Electronically Signed   By: Nelson Chimes M.D.   On: 03/24/2020 12:32   CT HEAD WO CONTRAST  Result Date: 03/24/2020 CLINICAL DATA:  Status post thrombectomy EXAM: CT HEAD WITHOUT CONTRAST TECHNIQUE: Contiguous axial images were obtained from the  base of the skull through the vertex without intravenous contrast. COMPARISON:  None. FINDINGS: Brain: There is no mass, hemorrhage or extra-axial collection. The size and configuration of the ventricles and extra-axial CSF spaces are normal. There is hypoattenuation of the white matter, most commonly indicating chronic small vessel disease. Vascular: Expected intravascular enhancement. Skull: The visualized skull base, calvarium and extracranial soft tissues are normal. Sinuses/Orbits: No fluid levels or advanced mucosal thickening of the visualized paranasal sinuses. No mastoid or middle ear effusion. The orbits are normal. IMPRESSION: 1. No acute intracranial abnormality. 2. Chronic small vessel disease. Electronically Signed   By: Ulyses Jarred M.D.   On: 03/24/2020 02:34  CT ANGIO NECK W OR WO CONTRAST  Result Date: 03/23/2020 CLINICAL DATA:  Left arm weakness and facial droop EXAM: CT ANGIOGRAPHY HEAD AND NECK TECHNIQUE: Multidetector CT imaging of the head and neck was performed using the standard protocol during bolus administration of intravenous contrast. Multiplanar CT image reconstructions and MIPs were obtained to evaluate the vascular anatomy. Carotid stenosis measurements (when applicable) are obtained utilizing NASCET criteria, using the distal internal carotid diameter as the denominator. CONTRAST:  26m OMNIPAQUE IOHEXOL 350 MG/ML SOLN COMPARISON:  None. FINDINGS: CTA NECK FINDINGS SKELETON: There is no bony spinal canal stenosis. No lytic or blastic lesion. OTHER NECK: Normal pharynx, larynx and major salivary glands. No cervical lymphadenopathy. Unremarkable thyroid gland. UPPER CHEST: No pneumothorax or pleural effusion. No nodules or masses. AORTIC ARCH: There is no calcific atherosclerosis of the aortic arch. There is no aneurysm, dissection or hemodynamically significant stenosis of the visualized portion of the aorta. Conventional 3 vessel aortic branching pattern. The visualized  proximal subclavian arteries are widely patent. RIGHT CAROTID SYSTEM: Normal without aneurysm, dissection or stenosis. LEFT CAROTID SYSTEM: Normal without aneurysm, dissection or stenosis. VERTEBRAL ARTERIES: Left dominant configuration. Both origins are clearly patent. There is no dissection, occlusion or flow-limiting stenosis to the skull base (V1-V3 segments). CTA HEAD FINDINGS POSTERIOR CIRCULATION: --Vertebral arteries: Normal V4 segments. --Inferior cerebellar arteries: Normal. --Basilar artery: Normal. --Superior cerebellar arteries: Normal. --Posterior cerebral arteries (PCA): Normal. ANTERIOR CIRCULATION: --Intracranial internal carotid arteries: Normal. --Anterior cerebral arteries (ACA): Normal. Both A1 segments are present. Patent anterior communicating artery (a-comm). --Middle cerebral arteries (MCA): There is a distal right MCA M1 segment occlusion. Left MCA is normal. VENOUS SINUSES: As permitted by contrast timing, patent. ANATOMIC VARIANTS: Fetal origin of the right posterior cerebral artery. There is moderate collateral flow in the right MCA territory. Review of the MIP images confirms the above findings. IMPRESSION: 1. Distal right MCA M1 segment occlusion with moderate collateral flow in the right MCA territory. 2. No other intracranial arterial occlusion or high-grade stenosis. 3. Normal cervical carotid and vertebral arteries. Critical Value/emergent results were called by telephone at the time of interpretation on 03/23/2020 at 11:13 pm to provider SSamara Snide, who verbally acknowledged these results. Electronically Signed   By: KUlyses JarredM.D.   On: 03/23/2020 23:17   MR ANGIO HEAD WO CONTRAST  Result Date: 03/25/2020 CLINICAL DATA:  Follow-up examination for acute stroke. EXAM: MRA HEAD WITHOUT CONTRAST TECHNIQUE: Angiographic images of the Circle of Willis were obtained using MRA technique without intravenous contrast. COMPARISON:  Prior brain MRI from earlier the same day as  well as recent CTA from 03/23/2020. FINDINGS: ANTERIOR CIRCULATION: Examination degraded by motion artifact, limiting assessment. Distal cervical segments of the internal carotid arteries are widely patent with symmetric antegrade flow. Petrous, cavernous, and supraclinoid ICAs patent without appreciable stenosis or other abnormality. A1 segments patent bilaterally. Normal anterior communicating artery complex. Anterior cerebral arteries remain widely patent to their distal aspects without stenosis. Right M1 segment patent proximally, but poorly visualized at its mid and distal aspect, and suspected to be reoccluded since prior catheter directed intervention. Right MCA bifurcation not well seen. Few small attenuated branches remain patent within the distal right MCA distribution, relatively stable from initial CTA. Left M1 segment widely patent. Normal left MCA bifurcation. Distal left MCA branches well perfused. POSTERIOR CIRCULATION: Vertebral arteries patent to the vertebrobasilar junction without stenosis. Patent right PICA. Left PICA not seen. Basilar patent to its distal aspect without stenosis. Superior cerebral arteries  grossly patent proximally. Left PCA supplied via the basilar. Predominant fetal type origin of the right PCA. PCAs remain patent to their distal aspects without stenosis. No appreciable intracranial aneurysm. IMPRESSION: 1. Technically limited exam due to motion artifact. 2. Probable reocclusion of the distal right M1 segment since recent catheter directed revascularization. Few attenuated small vessels remain patent within the right MCA distribution distally. Overall, appearance is similar as compared to initial presenting CTA. 3. No other intracranial large vessel occlusion or high-grade stenosis. Electronically Signed   By: Jeannine Boga M.D.   On: 03/25/2020 21:08   MR BRAIN WO CONTRAST  Result Date: 03/25/2020 CLINICAL DATA:  Follow-up examination for acute stroke. EXAM: MRI  HEAD WITHOUT CONTRAST TECHNIQUE: Multiplanar, multiecho pulse sequences of the brain and surrounding structures were obtained without intravenous contrast. COMPARISON:  Prior head CT from 03/24/2013 as well as previous studies. FINDINGS: Brain: Examination moderately degraded by motion artifact. Generalized age-related cerebral atrophy. Patchy T2/FLAIR hyperintensity within the periventricular and deep white matter both cerebral hemispheres as well as the pons, most consistent with chronic microvascular ischemic disease. Few scatter remote lacunar infarcts noted about the bilateral basal ganglia and thalami. Small remote right cerebellar infarct with associated chronic hemosiderin staining noted. Large confluent area of restricted diffusion seen involving the majority of the right MCA distribution, consistent with large acute right MCA territory infarct. Associated gyral swelling and edema throughout the area of infarction. Regional mass effect without significant right-to-left midline shift. Basilar cisterns remain widely patent. Associated petechial hemorrhage throughout the area of infarction without frank hemorrhagic transformation. Additional scattered patchy small volume foci of acute ischemia seen involving the right PCA territory, with involvement of the splenium, right thalamus, and right temporal occipital region. This is likely based on the predominant fetal type origin right PCA seen on corresponding CTA and MRA. Additional punctate focus of acute ischemia seen at the posterior left frontal periventricular white matter (series 2, image 25). No mass lesion or hydrocephalus. No extra-axial fluid collection. Pituitary gland suprasellar region within normal limits. Midline structures intact. Vascular: Loss of normal flow void within the distal right M1 segment extending into right M2 branches at the sylvian fissure. Associated susceptibility artifact seen on SWI sequence (series 7, image 33), consistent with  thrombus. Finding consistent with reocclusion of previously revascularized right MCA, also seen on corresponding MRA. Major intracranial vascular flow voids otherwise maintained. Skull and upper cervical spine: Craniocervical junction within normal limits. Bone marrow signal intensity normal. No scalp soft tissue abnormality. Sinuses/Orbits: Patient status post bilateral ocular lens replacement. Globes and orbital soft tissues demonstrate no acute finding. Paranasal sinuses are largely clear. No significant mastoid effusion. Other: None. IMPRESSION: 1. 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 Associated petechial hemorrhage without frank hemorrhagic transformation. 2. Additional scattered small volume acute ischemic nonhemorrhagic right PCA territory infarcts, likely based on the fetal type origin right PCA as seen on corresponding MRA and CTA. 3. Additional punctate focus of acute ischemia involving the posterior left frontal periventricular white matter. 4. Loss of normal flow void with susceptibility artifact involving distal right M1/M2 branch, consistent with reocclusion. Finding consistent with findings seen on corresponding MRA. 5. Underlying age-related cerebral atrophy with chronic microvascular ischemic disease, with a few scattered remote lacunar infarcts involving the bilateral basal ganglia and thalami. Electronically Signed   By: Jeannine Boga M.D.   On: 03/25/2020 21:28   US RENAL  Result Date: 03/24/2020 CLINICAL DATA:  Acute kidney injury.  Elevated BUN  and creatinine. EXAM: RENAL / URINARY TRACT ULTRASOUND COMPLETE COMPARISON:  None. FINDINGS: Right Kidney: Renal measurements: 9.7 x 4.7 x 4.2 cm. = volume: 97 mL. Normal echogenicity. Upper pole exophytic cyst measures 3.5 x 3.2 x 3.6 cm. No hydronephrosis Left Kidney: Renal measurements: 9.7 x 5.5 x 4.8 cm = volume: 132.8 mL. Normal echogenicity. No mass or hydronephrosis. Inferior pole cyst measures 1.8 x 1.7  x 1.6 cm. Bladder: Collapsed urinary bladder is not visualized. Other: None. IMPRESSION: 1. No acute abnormality. 2. Asymmetric right atrophy. 3. Bilateral kidney cysts. Electronically Signed   By: Kerby Moors M.D.   On: 03/24/2020 22:54   IR CT Head Ltd  Result Date: 03/27/2020 INDICATION: New onset confusion and left-sided weakness. 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 EXAM: 1. EMERGENT LARGE VESSEL OCCLUSION THROMBOLYSIS anterior CIRCULATION) COMPARISON:  CT angiogram of the head and neck of March 23, 2020. MEDICATIONS: None ANESTHESIA/SEDATION: General anesthesia CONTRAST:  Isovue 300 approximately 75 mL. FLUOROSCOPY TIME:  Fluoroscopy Time: 56 minutes 42 seconds (1865 mGy). COMPLICATIONS: None immediate. TECHNIQUE: Following a full explanation of the procedure along with the potential associated complications, an informed witnessed consent was obtained spouse and son the risks of intracranial hemorrhage of 10%, worsening neurological deficit, ventilator dependency, death and inability to revascularize were all reviewed in detail with the patient's spouse and son. The patient was then put under general anesthesia by the Department of Anesthesiology at [facility]. The right groin was prepped and draped in the usual sterile fashion. Thereafter using modified Seldinger technique, transfemoral access into the right common femoral artery was obtained without difficulty. Over a 0.035 inch guidewire an 8 French 25 cm Pinnacle sheath was inserted. Through this, and also over a 0.035 inch guidewire a combination of support a 5.5 French catheter inside of an 088 balloon guide catheter which had been prepped with 50% contrast and 50% heparinized saline infusion was advanced to the aortic arch. The guidewire was then advanced distally into the right common carotid artery followed by the formed Simmons support catheter followed by the balloon  guide catheter. The combination was navigated without difficulty to distal cervical right ICA. The guidewire and the 5.5 [place] support Simmons catheter were removed. Good aspiration obtained from the hub of the balloon guide catheter in the right internal carotid artery. FINDINGS: A control arteriogram performed through this demonstrated patency of the right internal carotid artery distal cervical, petrous cavernous and supraclinoid segments. A right posterior communicating artery is seen opacifying the right posterior cerebral artery distribution. The right middle cerebral artery demonstrates complete occlusion of the distal M1 segment. Opacification of the anterior temporal branch was noted. The right anterior cerebral artery opacifies into the capillary and venous phases. Initial right common carotid arteriogram demonstrated the right external carotid artery and its major branches to be widely patent. The right internal carotid artery at the bulb to the cranial skull base also demonstrates wide patency to the petrous portion. More distally the petrous, the cavernous and the supraclinoid segments demonstrate wide patency. The right posterior communicating artery opacifying the right posterior cerebral artery distribution was noted to be present. PROCEDURE: Through the 088 French balloon guide catheter in the distal right internal carotid artery, a combination of a Zoom 071 guide catheter inside of which was a 021 Trevo ProVue microcatheter was advanced over a 0.014 inch standard Synchro micro guidewire with a J configuration, the combination was navigated without difficulty  to the proximal cavernous segment. The micro guidewire was advanced without difficulty to the M2 M3 region of the inferior division of the right middle cerebellar followed by the microcatheter. The guidewire was removed. Good aspiration obtained from the hub of the microcatheter. A gentle control arteriogram performed through the microcatheter  demonstrated safe position of the tip of the microcatheter. At this time, a 5 mm x 37 mm Embotrap device was advanced to the distal end of the microcatheter. The hub on the microcatheter was loosened. With slight forward gentle traction with the right hand on the delivery micro guidewire the microcatheter was retrieved unsheathing the retrieval device. Proximal flow arrest was then initiated in the right internal carotid artery. Multiple attempts were made to advance the 071 Zoom catheter into the right middle cerebral artery. After considerable difficulty this was achieved. The 071 microcatheter was then advanced and positioned at the origin of the occlusion of the right middle cerebral artery distally. With constant aspiration being applied at the hub of the Zoom guide catheter, and hub of the balloon guide catheter with a 60 mL syringe over approximately 2-1/2 minutes, the combination of the retrieval device, the microcatheter and the Zoom catheter were retrieved and removed. Specks of clot were seen intertwined in the retrieval device. Following reversal of flow arrest, a control arteriogram performed through the balloon guide catheter continued to demonstrate no appreciable change in the occluded right middle cerebral M1 segment distally. A second pass was then made at this time using a 6 [place] Catalyst intermediary guide catheter inside of which was a Marksman 160 cm 027 microcatheter over an 016 inch Fathom micro guidewire. Again the microcatheter was positioned in the distal M2 M3 segment of the inferior division. The guidewire was removed. Good aspiration obtained from the hub of the microcatheter which was then connected to continuous heparinized saline infusion. A 4 mm x 40 mm Solitaire X retrieval device was then advanced to the distal end of the microcatheter and deployed in the manner described above. The Catalyst guide catheter was advanced and positioned just inside the origin of the dominant  inferior division with achievement of a complete flow arrest. With Penumbra aspiration being applied at the hub of the Catalyst guide catheter, and proximal flow arrest of the balloon catheter in the right internal carotid artery at 2-1/2 minutes, the combination of the retrieval device, the microcatheter and the 6 [place] Catalyst catheter were retrieved and removed. Flow arrest was then reversed. A control arteriogram performed through the balloon guide catheter in the right internal carotid artery demonstrated complete revascularization of the right middle cerebral artery distribution achieving a TICI 3 revascularization. Patency of the right anterior cerebral artery and the right posterior communicating artery was verified to be present. Spasm in the right internal carotid artery and the right internal carotid artery responded to 25 mcg of nitroglycerin intra-arterially. The balloon guide catheter was then retrieved into the right common carotid artery. A control arteriogram performed at this site continued to demonstrate wide patency of the right external carotid artery and its branches, also the right internal carotid artery at the bulb to the cranial skull base demonstrates wide patency. Intracranially no change was seen in the TICI 3 revascularization of the right middle cerebral artery distribution. The balloon guide catheter was retrieved and removed. A CT scan of the brain performed demonstrated no evidence of hemorrhage, mass or midline shift. The 8 French Pinnacle sheath was then removed with the successful application of an 8  French Angio-Seal closure device in the right groin. The groin appeared soft without evidence of hematoma or bleeding. Distal pulses remained Dopplerable in the posterior tibial, and the dorsalis pedis regions bilaterally unchanged. The patient was the left intubated because of the patient's neurological condition, and in order to protect the patient's airway. Patient was then  transferred to the neuro ICU intubated for post thrombectomy management. IMPRESSION: Status post endovascular complete revascularization of the occluded right middle cerebral M1 segment with 1 pass with the 5 mm x 37 mm Embotrap retrieval device, and 1 pass with the Solitaire 4 mm x 40 mm X retrieval device and Penumbra aspiration achieving a TICI 3 revascularization. PLAN: Follow-up in the clinic 4 weeks post discharge. Electronically Signed   By: Luanne Bras M.D.   On: 03/25/2020 10:53   DG CHEST PORT 1 VIEW  Result Date: 04/05/2020 CLINICAL DATA:  Leukocytosis.  History of hypertension and diabetes. EXAM: PORTABLE CHEST 1 VIEW COMPARISON:  03/26/2020 FINDINGS: Cardiac silhouette is normal in size. No mediastinal or hilar masses. Clear lungs.  No convincing pleural effusion and no pneumothorax. Enteric feeding tube passes through the stomach and below the included field of view. Skeletal structures are grossly intact. IMPRESSION: No acute cardiopulmonary disease. Electronically Signed   By: Lajean Manes M.D.   On: 04/05/2020 13:34   DG CHEST PORT 1 VIEW  Result Date: 03/26/2020 CLINICAL DATA:  Code stroke EXAM: PORTABLE CHEST 1 VIEW COMPARISON:  03/25/2020 FINDINGS: Cardiomediastinal contours and hilar structures are stable with central pulmonary vascular engorgement, heart size accentuated by low volumes and portable technique. No consolidation. No pleural effusion. Suspect subsegmental RIGHT lower lobe atelectasis as before. Visualized skeletal structures are unremarkable. IMPRESSION: No acute cardiopulmonary disease. Mild RIGHT basilar atelectasis similar to prior studies. Electronically Signed   By: Zetta Bills M.D.   On: 03/26/2020 08:32   DG CHEST PORT 1 VIEW  Result Date: 03/25/2020 CLINICAL DATA:  Fever and vomiting.  Concern for aspiration. EXAM: PORTABLE CHEST 1 VIEW COMPARISON:  Chest x-ray from yesterday. FINDINGS: The heart size and mediastinal contours are within normal limits.  Normal pulmonary vascularity. Persistent low lung volumes and chronic elevation of the right hemidiaphragm with unchanged mild right basilar atelectasis. No focal consolidation, pleural effusion, or pneumothorax. No acute osseous abnormality. IMPRESSION: 1. Persistent low lung volumes and mild right basilar atelectasis. No active disease. Electronically Signed   By: Titus Dubin M.D.   On: 03/25/2020 10:05   DG CHEST PORT 1 VIEW  Result Date: 03/24/2020 CLINICAL DATA:  Stroke.  Aspiration. EXAM: PORTABLE CHEST 1 VIEW COMPARISON:  Earlier same day FINDINGS: Endotracheal tube is been removed. Heart size remains normal. No pulmonary edema. The patient has not taken a deep inspiration. There may be mild atelectasis at the right lung base but there is no dense consolidation or lobar collapse. No effusion. Surgical clips again noted at the right thoracic inlet. IMPRESSION: Extubated. Mild atelectasis at the right lung base. No dense consolidation or lobar collapse. Electronically Signed   By: Nelson Chimes M.D.   On: 03/24/2020 13:07   DG CHEST PORT 1 VIEW  Result Date: 03/24/2020 CLINICAL DATA:  Endotracheal tube placement. EXAM: PORTABLE CHEST 1 VIEW COMPARISON:  August 19, 2018 FINDINGS: The endotracheal tube terminates above the carina by approximately 3 cm. The heart size is unchanged from prior study. The lung volumes are low. Aortic calcifications are noted. There is no pneumothorax. Bibasilar atelectasis is noted. IMPRESSION: 1. Endotracheal tube as above. 2.  Low lung volumes with bibasilar atelectasis. Electronically Signed   By: Constance Holster M.D.   On: 03/24/2020 03:54   DG Swallowing Func-Speech Pathology  Result Date: 03/28/2020 Objective Swallowing Evaluation: Type of Study: MBS-Modified Barium Swallow Study  Patient Details Name: MATTISYN CARDONA MRN: [id number] Date of Birth: 06-01-1946 Today's Date: 03/28/2020 Time: SLP Start Time (ACUTE ONLY): 107 -SLP Stop Time (ACUTE ONLY): 1308 SLP  Time Calculation (min) (ACUTE ONLY): 12 min Past Medical History: Past Medical History: Diagnosis Date  Arthritis   OA AND PAIN BOTH KNEES -RIGHT KNEE HURTS WORSE THAN LEFT  Cataract   Diabetes mellitus   ORAL MEDICATION - NO INSULIN  Hypertension   Hypertensive retinopathy   OU  Hypothyroidism   Kidney stones   NONE AT PRESENT TIME THAT PT IS AWARE OF  Osteoporosis  Past Surgical History: Past Surgical History: Procedure Laterality Date  ABDOMINAL HYSTERECTOMY    BUBBLE STUDY  03/26/2020  Procedure: BUBBLE STUDY;  Surgeon: Dorothy Spark, MD;  Location: [facility];  Service: Cardiovascular;;  CATARACT EXTRACTION    CHOLECYSTECTOMY  1972  COLONOSCOPY    EYE SURGERY    RIGHT CATARACT EXTRACTON WITH LENS IMPLANT  GROWTH REMOVED FROM THYROID    INTRAMEDULLARY (IM) NAIL INTERTROCHANTERIC Left 02/10/2018  Procedure: INTRAMEDULLARY (IM) NAIL INTERTROCHANTRIC;  Surgeon: Mcarthur Rossetti, MD;  Location: [facility];  Service: Orthopedics;  Laterality: Left;  INTRAMEDULLARY (IM) NAIL INTERTROCHANTERIC Right 08/20/2018  Procedure: ORIF RIGHT HIP INTERTROC FRACTURE;  Surgeon: Mcarthur Rossetti, MD;  Location: [facility];  Service: Orthopedics;  Laterality: Right;  IR CT HEAD LTD  03/24/2020  IR PERCUTANEOUS ART THROMBECTOMY/INFUSION INTRACRANIAL INC DIAG ANGIO  03/24/2020  left hip surgery   02/2018  ORIF HIP FRACTURE Right 08/20/2018  PERCUTANEOUS NEPHROLITHOTOMY    RADIOLOGY WITH ANESTHESIA N/A 03/23/2020  Procedure: IR WITH ANESTHESIA;  Surgeon: Luanne Bras, MD;  Location: [place];  Service: Radiology;  Laterality: N/A;  TEE WITHOUT CARDIOVERSION N/A 03/26/2020  Procedure: TRANSESOPHAGEAL ECHOCARDIOGRAM (TEE);  Surgeon: Dorothy Spark, MD;  Location: [facility] ENDOSCOPY;  Service: Cardiovascular;  Laterality: N/A;  TOTAL KNEE ARTHROPLASTY  06/09/2012  Procedure: TOTAL KNEE ARTHROPLASTY;  Surgeon: Mcarthur Rossetti, MD;  Location: [facility];  Service: Orthopedics;  Laterality: Right;  Right Total  Knee Arthroplasty  TOTAL KNEE ARTHROPLASTY Left 04/09/2014  Procedure: LEFT TOTAL KNEE ARTHROPLASTY;  Surgeon: Mcarthur Rossetti, MD;  Location: [place];  Service: Orthopedics;  Laterality: Left;  TUBAL LIGATION   HPI: 74 y.o female admitted with sudden onset Lt sided weakness and surred speech.  Tpa was administered.  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 pt extubated 6/14, but had increasing lethsrgy > possibly septic due to UTI.  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   Subjective: Pt awake, alert, pleasant, participative Assessment / Plan / Recommendation CHL IP CLINICAL IMPRESSIONS 03/28/2020 Clinical Impression Pt presents with inconsistent aspiration across thin and thickened liquid consistencies which appears to be related to a combination of decreased attention to boluses in the setting of lethargy and overt cognitive impairment as well as decreased oral control of boluses resulting from oral motor weakness.  Aspiration was sensed and pt did intermittently exhibit the abiltiy to clear penetrates from the airway; however, due to pt's inconsistent presentation on today's study, diet initiation is not recommended at this time.  Instead, I would suggest beginning trials of purees and nectar thick  liquids via teaspoon with speech therapy only.  Prognosis for advancement is good with improving alertness and mentation.   SLP Visit Diagnosis Dysphagia, oropharyngeal phase (R13.12) Attention and concentration deficit following -- Frontal lobe and executive function deficit following -- Impact on safety and function Moderate aspiration risk   CHL IP TREATMENT RECOMMENDATION 03/28/2020 Treatment Recommendations Therapy as outlined in treatment plan below   Prognosis 03/28/2020 Prognosis for Safe Diet Advancement Good Barriers to Reach Goals Cognitive deficits Barriers/Prognosis Comment -- CHL IP  DIET RECOMMENDATION 03/28/2020 SLP Diet Recommendations NPO;Alternative means - temporary Liquid Administration via -- Medication Administration Via alternative means Compensations -- Postural Changes --   CHL IP OTHER RECOMMENDATIONS 03/28/2020 Recommended Consults -- Oral Care Recommendations Oral care QID Other Recommendations --   CHL IP FOLLOW UP RECOMMENDATIONS 03/28/2020 Follow up Recommendations Inpatient Rehab   CHL IP FREQUENCY AND DURATION 03/28/2020 Speech Therapy Frequency (ACUTE ONLY) min 3x week Treatment Duration --      CHL IP ORAL PHASE 03/28/2020 Oral Phase Impaired Oral - Pudding Teaspoon -- Oral - Pudding Cup -- Oral - Honey Teaspoon Left anterior bolus loss;Lingual/palatal residue;Weak lingual manipulation;Premature spillage;Decreased bolus cohesion Oral - Honey Cup Left anterior bolus loss;Weak lingual manipulation;Decreased bolus cohesion;Premature spillage;Lingual/palatal residue Oral - Nectar Teaspoon Left anterior bolus loss;Weak lingual manipulation;Lingual/palatal residue;Decreased bolus cohesion;Premature spillage Oral - Nectar Cup Left anterior bolus loss;Decreased bolus cohesion;Premature spillage;Weak lingual manipulation;Lingual/palatal residue Oral - Nectar Straw -- Oral - Thin Teaspoon Left anterior bolus loss;Weak lingual manipulation;Decreased bolus cohesion;Premature spillage;Lingual/palatal residue Oral - Thin Cup -- Oral - Thin Straw -- Oral - Puree WFL Oral - Mech Soft -- Oral - Regular -- Oral - Multi-Consistency -- Oral - Pill -- Oral Phase - Comment --  CHL IP PHARYNGEAL PHASE 03/28/2020 Pharyngeal Phase Impaired Pharyngeal- Pudding Teaspoon -- Pharyngeal -- Pharyngeal- Pudding Cup -- Pharyngeal -- Pharyngeal- Honey Teaspoon Penetration/Aspiration during swallow Pharyngeal Material enters airway, CONTACTS cords and not ejected out Pharyngeal- Honey Cup Penetration/Aspiration during swallow Pharyngeal Material enters airway, passes BELOW cords and not ejected out despite cough  attempt by patient Pharyngeal- Nectar Teaspoon Penetration/Aspiration during swallow Pharyngeal Material enters airway, CONTACTS cords and then ejected out Pharyngeal- Nectar Cup Penetration/Aspiration during swallow Pharyngeal Material enters airway, passes BELOW cords and not ejected out despite cough attempt by patient Pharyngeal- Nectar Straw -- Pharyngeal -- Pharyngeal- Thin Teaspoon Penetration/Aspiration during swallow Pharyngeal Material enters airway, passes BELOW cords and not ejected out despite cough attempt by patient Pharyngeal- Thin Cup -- Pharyngeal -- Pharyngeal- Thin Straw -- Pharyngeal -- Pharyngeal- Puree WFL Pharyngeal -- Pharyngeal- Mechanical Soft -- Pharyngeal -- Pharyngeal- Regular -- Pharyngeal -- Pharyngeal- Multi-consistency -- Pharyngeal -- Pharyngeal- Pill -- Pharyngeal -- Pharyngeal Comment --  No flowsheet data found. Page, Selinda Orion 03/28/2020, 2:23 PM              EEG adult  Result Date: 03/30/2020 Lora Havens, MD     03/30/2020  8:56 AM Patient Name: FE OKUBO MRN: [id number] Epilepsy Attending: Lora Havens Referring Physician/Provider: Dr Phillips Odor Date: 03/29/2020 Duration:  22.29 mins Patient history: 73yo F with R MCA infarct, noted to have fluctuation mental status and movements of right leg concerning for seizure. EEG to evaluate for seizure Level of alertness: Awake AEDs during EEG study: None Technical aspects: This EEG study was done with scalp electrodes positioned according to the 10-20 International system of electrode placement. Electrical activity was acquired at a sampling rate of 500Hz  and reviewed with a high frequency  filter of 70Hz  and a low frequency filter of 1Hz . EEG data were recorded continuously and digitally stored. Description: The posterior dominant rhythm consists of 9Hz  activity of moderate voltage (25-35 uV) seen predominantly in posterior head regions, symmetric and reactive to eye opening and eye closing. EEG showed continuous 3  to 6 Hz theta-delta slowing in right fronto-centro-temporal region. Intermittent generalized 3-5hz  theta-delta slowing was also noted Hyperventilation and photic stimulation were not performed.   During the study, patient was noted to have repeated right leg kicking movements. Concomitant eeg before, during and after the event didn't show any eeg change to suggest seizure. ABNORMALITY -Continuous slow,  Right fronto-centro-temporal region - Intermittent slow, generalized IMPRESSION: This study is suggestive of cortical dysfunction in right fronto-centro-temporal region consistent with underlying infarct. Additionally there is evidence of mild diffuse encephalopathy, non specific etiology. No seizures or epileptiform discharges were seen throughout the recording. Multiple episodes of right leg kicking movements were captured without concomitant eeg change and were NOT epileptic Priyanka Barbra Sarks   EEG adult  Result Date: 03/25/2020 Lora Havens, MD     03/25/2020  1:44 PM Patient Name: CAYLAH PLOUFF MRN: [id number] Epilepsy Attending: Lora Havens Referring Physician/Provider: Dr. Rosalin Hawking Date: 03/25/2020 Duration: 25.29 minutes Patient history: 74 year old female presenting with slurred speech, left-sided weakness and ataxia.  MRI brain showed right MCA infarct.  EEG evaluate for seizures. Level of alertness: Awake, asleep AEDs during EEG study: None Technical aspects: This EEG study was done with scalp electrodes positioned according to the 10-20 International system of electrode placement. Electrical activity was acquired at a sampling rate of 500Hz  and reviewed with a high frequency filter of 70Hz  and a low frequency filter of 1Hz . EEG data were recorded continuously and digitally stored. Description: During awake state, no clear posterior dominant rhythm was seen.  Sleep was characterized by vertex, maximal frontocentral region.  EEG showed continuous generalized and lateralized right hemisphere 3  to 6 Hz theta-delta slowing.  Sharp waves were also seen in right frontal region which appeared rhythmic without any clear evolution when patient was stimulated at the end of the study.  Hyperventilation and photic stimulation were not performed.   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  The sharp waves appeared rhythmic when patient was stimulated without any clear evolution and is more likely related to arousal.  No definite seizures were seen during the study.Lora Havens   ECHOCARDIOGRAM COMPLETE  Result Date: 03/24/2020    ECHOCARDIOGRAM REPORT   Patient Name:   MAILI SHUTTERS Date of Exam: 03/24/2020 Medical Rec #:  [id number]        Height:       67.0 in Accession #:    [id number]       Weight:       169.8 lb Date of Birth:  Oct 03, 1946        BSA:          1.886 m Patient Age:    11 years         BP:           115/66 mmHg Patient Gender: F                HR:           85  bpm. Exam Location:  Inpatient Procedure: 2D Echo, Cardiac Doppler, Color Doppler and Intracardiac            Opacification Agent Indications:    Stroke 434.91 / I163.9  History:        Patient has no prior history of Echocardiogram examinations.                 Risk Factors:Hypertension and Diabetes. Hypothyrodism.  Sonographer:    Tiffany Dance Referring Phys: [id number] R AROOR  Sonographer Comments: Echo performed with patient supine and on artificial respirator. IMPRESSIONS  1. Left ventricular ejection fraction, by estimation, is 70 to 75%. The left ventricle has hyperdynamic function with intracavitary gradient up to 32 mmHg. The left ventricle has no regional wall motion abnormalities. There is mild left ventricular hypertrophy. Left ventricular diastolic parameters  are indeterminate.  2. Right ventricular systolic function is normal. The right ventricular size is normal. Tricuspid regurgitation signal is inadequate for assessing PA pressure.  3. The aortic valve is tricuspid. Aortic valve regurgitation is not visualized. Mild aortic valve sclerosis is present, with no evidence of aortic valve stenosis.  4. Aortic dilatation noted. 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  5. The inferior vena cava is normal in size with greater than 50% respiratory variability, suggesting right atrial pressure of 3 mmHg.  6. Appears to be echodensity attached to left atrial side of posterior leaflet of mitral valve (best seen on image 3), but notably only seen on parasternal long axis images. Recomend further evaluation with with TEE to assess for cardioembolic source of  CVA. The mitral valve is degenerative. Trivial mitral valve regurgitation. FINDINGS  Left Ventricle: Hyperdynamic LV function with intracavitary gradient 32 mmHg. Left ventricular ejection fraction, by estimation, is 70 to 75%. The left ventricle has hyperdynamic function. The left ventricle has no regional wall motion abnormalities. Definity contrast agent was given IV to delineate the left ventricular endocardial borders. The left ventricular internal cavity size was normal in size. There is mild left ventricular hypertrophy. Left ventricular diastolic parameters are indeterminate. Right Ventricle: The right ventricular size is normal. Right vetricular wall thickness was not assessed. Right ventricular systolic function is normal. Tricuspid regurgitation signal is inadequate for assessing PA pressure. Left Atrium: Left atrial size was normal in size. Right Atrium: Right atrial size was normal in size. Pericardium: There is no evidence of pericardial effusion. Mitral Valve: Possible echodensity attached to left atrial side of posterior leaflet of mitral valve (best seen on image 3), but notably only  seen on parasternal long axis images. Recomend further evaluation with with TEE to rule out cardioembolic source  of CVA. The mitral valve is degenerative in appearance. Trivial mitral valve regurgitation. Tricuspid Valve: The tricuspid valve is normal in structure. Tricuspid valve regurgitation is not demonstrated. Aortic Valve: The aortic valve is tricuspid. Aortic valve regurgitation is not visualized. Mild aortic valve sclerosis is present, with no evidence of aortic valve stenosis. Pulmonic Valve: The pulmonic valve was not well visualized. Pulmonic valve regurgitation is not visualized. Aorta: Aortic dilatation noted. 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 Venous: The inferior vena cava is normal in size with greater than 50% respiratory variability, suggesting right atrial pressure of 3 mmHg. IAS/Shunts: The interatrial septum was not well visualized.  LEFT VENTRICLE PLAX 2D LVIDd:         4.00 cm LVIDs:         2.90 cm LV  PW:         1.10 cm LV IVS:        1.00 cm LVOT diam:     1.80 cm LV SV:         86 LV SV Index:   46 LVOT Area:     2.54 cm  RIGHT VENTRICLE             IVC RV Basal diam:  2.10 cm     IVC diam: 1.60 cm RV S prime:     19.60 cm/s TAPSE (M-mode): 1.7 cm LEFT ATRIUM             Index       RIGHT ATRIUM          Index LA diam:        3.40 cm 1.80 cm/m  RA Area:     7.76 cm LA Vol (A2C):   42.5 ml 22.53 ml/m RA Volume:   12.30 ml 6.52 ml/m LA Vol (A4C):   29.3 ml 15.53 ml/m LA Biplane Vol: 37.4 ml 19.83 ml/m  AORTIC VALVE LVOT Vmax:   176.00 cm/s LVOT Vmean:  123.000 cm/s LVOT VTI:    0.338 m  AORTA Ao Root diam: 3.10 cm Ao Asc diam:  3.60 cm MITRAL VALVE MV Area (PHT): 2.60 cm     SHUNTS MV Decel Time: 292 msec     Systemic VTI:  0.34 m MV E velocity: 62.80 cm/s   Systemic Diam: 1.80 cm MV A velocity: 104.00 cm/s MV E/A ratio:  0.60 Oswaldo Milian MD Electronically signed by Oswaldo Milian MD Signature Date/Time: 03/24/2020/10:42:15 AM    Final     ECHO TEE  Result Date: 03/26/2020    TRANSESOPHOGEAL ECHO REPORT   Patient Name:   ALEEA HENDRY Date of Exam: 03/26/2020 Medical Rec #:  [id number]        Height:       67.0 in Accession #:    [id number]       Weight:       181.7 lb Date of Birth:  [birth year]        BSA:          1.941 m Patient Age:    13 years         BP:           163/82 mmHg Patient Gender: F                HR:           92 bpm. Exam Location:  Inpatient Procedure: 2D Echo, Cardiac Doppler, Color Doppler and Saline Contrast Bubble            Study Indications:     Stroke  History:         Patient has prior history of Echocardiogram examinations, most                  recent 03/24/2020. Risk Factors:Hypertension and Diabetes. CKD.  Sonographer:     Clayton Lefort RDCS (AE) Referring Phys:  [place] Diagnosing Phys: Ena Dawley MD PROCEDURE: After discussion of the risks and benefits of a TEE, an informed consent was obtained from the patient. The transesophogeal probe was passed without difficulty through the esophogus of the patient. Sedation performed by different physician. The patient was monitored while under deep sedation. Anesthestetic sedation was provided intravenously by Anesthesiology: 207.77m of Propofol. Image quality was adequate. The patient's vital signs; including heart  rate, blood pressure, and oxygen saturation; remained stable throughout the procedure. The patient developed no complications during the procedure. IMPRESSIONS  1. No mass or vegetations were identified on the mitral or other valves. No thrombus in the left atrial appendage. Negative bubble study.  2. Left ventricular ejection fraction, by estimation, is 70 to 75%. The left ventricle has hyperdynamic function. The left ventricle has no regional wall motion abnormalities. There is mild concentric left ventricular hypertrophy. Left ventricular diastolic function could not be evaluated.  3. Right ventricular systolic function is normal. The right  ventricular size is normal.  4. Left atrial size was mildly dilated. No left atrial/left atrial appendage thrombus was detected. The LAA emptying velocity was 60 cm/s.  5. The mitral valve is normal in structure. Mild mitral valve regurgitation. No evidence of mitral stenosis.  6. The aortic valve is normal in structure. Aortic valve regurgitation is mild. No aortic stenosis is present.  7. There is mild (Grade II) plaque.  8. The inferior vena cava is normal in size with greater than 50% respiratory variability, suggesting right atrial pressure of 3 mmHg.  9. Agitated saline contrast bubble study was negative, with no evidence of any interatrial shunt. Conclusion(s)/Recommendation(s): No LA/LAA thrombus identified. Negative bubble study for interatrial shunt. No intracardiac source of embolism detected on this on this transesophageal echocardiogram. FINDINGS  Left Ventricle: Left ventricular ejection fraction, by estimation, is 70 to 75%. The left ventricle has hyperdynamic function. The left ventricle has no regional wall motion abnormalities. The left ventricular internal cavity size was normal in size. There is mild concentric left ventricular hypertrophy. Left ventricular diastolic function could not be evaluated. Right Ventricle: The right ventricular size is normal. No increase in right ventricular wall thickness. Right ventricular systolic function is normal. Left Atrium: Left atrial size was mildly dilated. No left atrial/left atrial appendage thrombus was detected. The LAA emptying velocity was 60 cm/s. Right Atrium: Right atrial size was normal in size. Pericardium: There is no evidence of pericardial effusion. Mitral Valve: The mitral valve is normal in structure. Normal mobility of the mitral valve leaflets. Mild mitral valve regurgitation. No evidence of mitral valve stenosis. There is no evidence of mitral valve vegetation. Tricuspid Valve: The tricuspid valve is normal in structure. Tricuspid valve  regurgitation is mild . No evidence of tricuspid stenosis. There is no evidence of tricuspid valve vegetation. Aortic Valve: The aortic valve is normal in structure. Aortic valve regurgitation is mild. No aortic stenosis is present. There is no evidence of aortic valve vegetation. Pulmonic Valve: The pulmonic valve was normal in structure. Pulmonic valve regurgitation is not visualized. No evidence of pulmonic stenosis. There is no evidence of pulmonic valve vegetation. Aorta: The aortic root is normal in size and structure. There is mild (Grade II) plaque. Venous: The inferior vena cava is normal in size with greater than 50% respiratory variability, suggesting right atrial pressure of 3 mmHg. IAS/Shunts: No atrial level shunt detected by color flow Doppler. Agitated saline contrast was given intravenously to evaluate for intracardiac shunting. Agitated saline contrast bubble study was negative, with no evidence of any interatrial shunt. Ena Dawley MD Electronically signed by Ena Dawley MD Signature Date/Time: 03/26/2020/3:24:18 PM    Final    Intravitreal Injection, Pharmacologic Agent - OD - Right Eye  Result Date: 03/14/2020 Time Out 03/14/2020. 9:35 AM. Confirmed correct patient, procedure, site, and patient consented. Anesthesia Topical anesthesia was used. Anesthetic medications included Lidocaine 2%, Proparacaine 0.5%. Procedure Preparation included 5% betadine  to ocular surface, eyelid speculum. A (32g) needle was used. Injection: 2 mg aflibercept Alfonse Flavors) SOLN   NDC: A3590391, Lot: [id number], Expiration date: 07/07/2020   Route: Intravitreal, Site: Right Eye, Waste: 0.05 mL Post-op Post injection exam found visual acuity of at least counting fingers. The patient tolerated the procedure well. There were no complications. The patient received written and verbal post procedure care education.   OCT, Retina - OU - Both Eyes  Result Date: 03/14/2020 Right Eye Quality was good. Central Foveal  Thickness: 266. Progression has been stable. Findings include no SRF, intraretinal fluid, cystoid macular edema, normal foveal contour, intraretinal hyper-reflective material (Persistent IRFsuperior macular ). Left Eye Quality was good. Central Foveal Thickness: 279. Progression has been stable. Findings include normal foveal contour, no IRF, no SRF, vitreomacular adhesion . 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 CME - cystoid macular edema. PED - pigment epithelial detachment. IRF - intraretinal fluid. SRF - subretinal fluid. EZ - ellipsoid zone. ERM - epiretinal membrane. ORA - outer retinal atrophy. ORT - outer retinal tubulation. SRHM - subretinal hyper-reflective material   IR PERCUTANEOUS ART THROMBECTOMY/INFUSION INTRACRANIAL INC DIAG ANGIO  Result Date: 03/27/2020 INDICATION: New onset confusion and left-sided weakness. 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 EXAM: 1. EMERGENT LARGE VESSEL OCCLUSION THROMBOLYSIS anterior CIRCULATION) COMPARISON:  CT angiogram of the head and neck of March 23, 2020. MEDICATIONS: None ANESTHESIA/SEDATION: General anesthesia CONTRAST:  Isovue 300 approximately 75 mL. FLUOROSCOPY TIME:  Fluoroscopy Time: 56 minutes 42 seconds (1865 mGy). COMPLICATIONS: None immediate. TECHNIQUE: Following a full explanation of the procedure along with the potential associated complications, an informed witnessed consent was obtained spouse and son the risks of intracranial hemorrhage of 10%, worsening neurological deficit, ventilator dependency, death and inability to revascularize were all reviewed in detail with the patient's spouse and son. The patient was then put under general anesthesia by the Department of Anesthesiology at [facility]. The right groin was prepped and draped in the usual sterile fashion. Thereafter using modified Seldinger technique, transfemoral access into the right common femoral artery was obtained without difficulty. Over a 0.035 inch guidewire an 8 French 25 cm Pinnacle sheath was inserted. Through this, and also over a 0.035 inch guidewire a combination of support a 5.5 French catheter inside of an 088 balloon guide catheter which had been prepped with 50% contrast and 50% heparinized saline infusion was advanced to the aortic arch. The guidewire was then advanced distally into the right common carotid artery followed by the formed Simmons support catheter followed by the balloon guide catheter. The combination was navigated without difficulty to distal cervical right ICA. The guidewire and the 5.5 [place] support Simmons catheter were removed. Good aspiration obtained from the hub of the balloon guide catheter in the right internal carotid artery. FINDINGS: A control arteriogram performed through this demonstrated patency of the right internal carotid artery distal cervical, petrous cavernous and supraclinoid segments. A right posterior communicating artery is seen opacifying the right posterior cerebral artery distribution. The right middle cerebral artery demonstrates complete occlusion of the distal M1 segment. Opacification of the anterior temporal branch was noted. The right anterior cerebral artery opacifies into the capillary and venous phases. Initial right common carotid arteriogram demonstrated the right external carotid artery and its major  branches to be widely patent. The right internal carotid artery at the bulb to the cranial skull base also demonstrates wide patency to the petrous portion. More distally the petrous, the cavernous and the supraclinoid segments demonstrate wide patency. The right posterior communicating artery opacifying the right posterior cerebral artery distribution was noted to be  present. PROCEDURE: Through the 088 French balloon guide catheter in the distal right internal carotid artery, a combination of a Zoom 071 guide catheter inside of which was a 021 Trevo ProVue microcatheter was advanced over a 0.014 inch standard Synchro micro guidewire with a J configuration, the combination was navigated without difficulty to the proximal cavernous segment. The micro guidewire was advanced without difficulty to the M2 M3 region of the inferior division of the right middle cerebellar followed by the microcatheter. The guidewire was removed. Good aspiration obtained from the hub of the microcatheter. A gentle control arteriogram performed through the microcatheter demonstrated safe position of the tip of the microcatheter. At this time, a 5 mm x 37 mm Embotrap device was advanced to the distal end of the microcatheter. The hub on the microcatheter was loosened. With slight forward gentle traction with the right hand on the delivery micro guidewire the microcatheter was retrieved unsheathing the retrieval device. Proximal flow arrest was then initiated in the right internal carotid artery. Multiple attempts were made to advance the 071 Zoom catheter into the right middle cerebral artery. After considerable difficulty this was achieved. The 071 microcatheter was then advanced and positioned at the origin of the occlusion of the right middle cerebral artery distally. With constant aspiration being applied at the hub of the Zoom guide catheter, and hub of the balloon guide catheter with a 60 mL syringe over approximately 2-1/2 minutes, the combination of the retrieval device, the microcatheter and the Zoom catheter were retrieved and removed. Specks of clot were seen intertwined in the retrieval device. Following reversal of flow arrest, a control arteriogram performed through the balloon guide catheter continued to demonstrate no appreciable change in the occluded right middle cerebral M1 segment  distally. A second pass was then made at this time using a 6 [place] Catalyst intermediary guide catheter inside of which was a Marksman 160 cm 027 microcatheter over an 016 inch Fathom micro guidewire. Again the microcatheter was positioned in the distal M2 M3 segment of the inferior division. The guidewire was removed. Good aspiration obtained from the hub of the microcatheter which was then connected to continuous heparinized saline infusion. A 4 mm x 40 mm Solitaire X retrieval device was then advanced to the distal end of the microcatheter and deployed in the manner described above. The Catalyst guide catheter was advanced and positioned just inside the origin of the dominant inferior division with achievement of a complete flow arrest. With Penumbra aspiration being applied at the hub of the Catalyst guide catheter, and proximal flow arrest of the balloon catheter in the right internal carotid artery at 2-1/2 minutes, the combination of the retrieval device, the microcatheter and the 6 [place] Catalyst catheter were retrieved and removed. Flow arrest was then reversed. A control arteriogram performed through the balloon guide catheter in the right internal carotid artery demonstrated complete revascularization of the right middle cerebral artery distribution achieving a TICI 3 revascularization. Patency of the right anterior cerebral artery and the right posterior communicating artery was verified to be present. Spasm in the right internal carotid artery and the right internal carotid artery responded to 25 mcg of nitroglycerin  intra-arterially. The balloon guide catheter was then retrieved into the right common carotid artery. A control arteriogram performed at this site continued to demonstrate wide patency of the right external carotid artery and its branches, also the right internal carotid artery at the bulb to the cranial skull base demonstrates wide patency. Intracranially no change was seen in the TICI 3  revascularization of the right middle cerebral artery distribution. The balloon guide catheter was retrieved and removed. A CT scan of the brain performed demonstrated no evidence of hemorrhage, mass or midline shift. The 8 French Pinnacle sheath was then removed with the successful application of an 8 French Angio-Seal closure device in the right groin. The groin appeared soft without evidence of hematoma or bleeding. Distal pulses remained Dopplerable in the posterior tibial, and the dorsalis pedis regions bilaterally unchanged. The patient was the left intubated because of the patient's neurological condition, and in order to protect the patient's airway. Patient was then transferred to the neuro ICU intubated for post thrombectomy management. IMPRESSION: Status post endovascular complete revascularization of the occluded right middle cerebral M1 segment with 1 pass with the 5 mm x 37 mm Embotrap retrieval device, and 1 pass with the Solitaire 4 mm x 40 mm X retrieval device and Penumbra aspiration achieving a TICI 3 revascularization. PLAN: Follow-up in the clinic 4 weeks post discharge. Electronically Signed   By: Luanne Bras M.D.   On: 03/25/2020 10:53   CT HEAD CODE STROKE WO CONTRAST  Result Date: 03/23/2020 CLINICAL DATA:  Code stroke.  Left arm weakness and facial droop. EXAM: CT HEAD WITHOUT CONTRAST TECHNIQUE: Contiguous axial images were obtained from the base of the skull through the vertex without intravenous contrast. COMPARISON:  None. FINDINGS: Brain: There is no mass, hemorrhage or extra-axial collection. The size and configuration of the ventricles and extra-axial CSF spaces are normal. There is hypoattenuation of the periventricular white matter, most commonly indicating chronic ischemic microangiopathy. Vascular: No abnormal hyperdensity of the major intracranial arteries or dural venous sinuses. No intracranial atherosclerosis. Skull: The visualized skull base, calvarium and  extracranial soft tissues are normal. Sinuses/Orbits: No fluid levels or advanced mucosal thickening of the visualized paranasal sinuses. No mastoid or middle ear effusion. The orbits are normal. ASPECTS [facility] Stroke Program Early CT Score) - Ganglionic level infarction (caudate, lentiform nuclei, internal capsule, insula, M1-M3 cortex): 7 - Supraganglionic infarction (M4-M6 cortex): 3 Total score (0-10 with 10 being normal): 10 IMPRESSION: 1. No acute intracranial abnormality 2. ASPECTS is 10. These results were communicated to Dr. Karena Addison Aroor at 10:41 pm on 03/23/2020 by text page via the [facility] messaging system. Electronically Signed   By: Ulyses Jarred M.D.   On: 03/23/2020 22:41   VAS [place] LOWER EXTREMITY VENOUS (DVT)  Result Date: 03/24/2020  Lower Venous DVTStudy Indications: Stroke.  Limitations: Patient positioning. Comparison Study: 05/26/18 previous Performing Technologist: Abram Sander RVS  Examination Guidelines: A complete evaluation includes B-mode imaging, spectral Doppler, color Doppler, and power Doppler as needed of all accessible portions of each vessel. Bilateral testing is considered an integral part of a complete examination. Limited examinations for reoccurring indications may be performed as noted. The reflux portion of the exam is performed with the patient in reverse Trendelenburg.  +---------+---------------+---------+-----------+----------+--------------+  RIGHT     Compressibility Phasicity Spontaneity Properties Thrombus Aging  +---------+---------------+---------+-----------+----------+--------------+  CFV       Full            Yes       Yes                                    +---------+---------------+---------+-----------+----------+--------------+  SFJ       Full                                                             +---------+---------------+---------+-----------+----------+--------------+  FV Prox   Full                                                              +---------+---------------+---------+-----------+----------+--------------+  FV Mid    Full                                                             +---------+---------------+---------+-----------+----------+--------------+  FV Distal Full                                                             +---------+---------------+---------+-----------+----------+--------------+  PFV       Full                                                             +---------+---------------+---------+-----------+----------+--------------+  POP       Full            Yes       Yes                                    +---------+---------------+---------+-----------+----------+--------------+  PTV       Full                                                             +---------+---------------+---------+-----------+----------+--------------+  PERO      Full                                                             +---------+---------------+---------+-----------+----------+--------------+   +---------+---------------+---------+-----------+----------+--------------+  LEFT      Compressibility Phasicity Spontaneity Properties Thrombus Aging  +---------+---------------+---------+-----------+----------+--------------+  CFV       Full            Yes       Yes                                    +---------+---------------+---------+-----------+----------+--------------+  SFJ       Full                                                             +---------+---------------+---------+-----------+----------+--------------+  FV Prox   Full                                                             +---------+---------------+---------+-----------+----------+--------------+  FV Mid    Full                                                             +---------+---------------+---------+-----------+----------+--------------+  FV Distal                 Yes       Yes                                     +---------+---------------+---------+-----------+----------+--------------+  PFV       Full                                                             +---------+---------------+---------+-----------+----------+--------------+  POP       Full            Yes       Yes                                    +---------+---------------+---------+-----------+----------+--------------+  PTV       Full                                                             +---------+---------------+---------+-----------+----------+--------------+  PERO                                                       Not visualized  +---------+---------------+---------+-----------+----------+--------------+     Summary: BILATERAL: - No evidence of deep vein thrombosis seen in the lower extremities, bilaterally. -   *See table(s) above for measurements and observations. Electronically signed by Curt Jews MD on 03/24/2020 at 5:33:26 PM.    Final        HISTORY OF PRESENT ILLNESS ASHNI LONZO is a 74 y.o. female with past medical history significant  for diabetes mellitus, hypertension, hypothyroidism brought to the emergency department by her son for sudden onset slurred speech and left-sided weakness. Code stroke initiated by EDP on triage.  The patient was her normal self until 8:50 PM on 03/23/2020 when she suddenly felt slurred speech, left-sided weakness.  Her husband called patient's son prior to the emergency department.  On initial evaluation patient's NIH stroke scale was 5 for slurred speech, left facial droop, left upper extremity weakness and ataxia and left lower extremity drift.  At the time no obvious neglect was appreciated.  CT head was obtained showed no acute findings, ASPECTS 10-TPA was administered after obtaining the patient's consent.  Upon reassessment, patient's NIHSS worsened to 9 due to obvious visual and sensory hemineglect.  CT angiogram confirmed right distal M1 occlusion and patient was taken for mechanical  thrombectomy. Baseline MRS 0.  HOSPITAL COURSE Ms. SHERLYNN TOURVILLE is a 74 y.o. female with history of HTN, HLD, DB presenting with slurred speech, L sided weakness and ataxia. Received IV tPA 03/23/2020 at 2252. Worsening in ED. CTA confirmed distal R M1 occlusion. Taken to IR.    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 ASPECTS 10.     CTA head & neck distal R M2 occlusion w/ moderate collateral flow R MCA territory.   Cerebral angio angio w/ TICI3 revascularization R M1 occlusion   Post IR CT no ICH or mass effect   CT head 6/14 0234 no acute abnormality. Small vessel disease.   CT head 6/14 1232 R MCA territory infarct sparing basal ganglia  CT head 6/14 2159 progressive R MCA territory infarct w/ basal ganglia sparing. Hyperdense R M2  MRI  Large R MCA territory infarct w/ petechial hemorrhage. Scattered small R PCA territory infarcts. Punctate poster L periventricular WM infarct. Old B basal ganglia and thalamic lacunes.   MRA  Reocclusion distal R M1  EEG R frontal sharp waves, generalized and lateralized R brain slowing  LE Doppler  No DVT  2D Echo EF 70-75%. No obvious source of embolus. ?Echodensity L side of MV  TEE normal MV, EF 70-75%, no PFO  Loop recorder placed 7/2 to look for AF as source of stroke  TG >500->247, Direct LDL 63.8  HgbA1c 8.9  Heparin 5000 units sq tid for VTE prophylaxis  aspirin 81 mg daily prior to admission, was on ASA 325 & Plavix 75 DAPT -> now ASA 81 only due to persistent hematuria   Therapy recommendations:  CIR   Disposition:  CIR  Acute Respiratory Failure, resolved  Intubated for IR, left intubated d/t slow response to reversal and involuntary flapping of RUE   Extubated 6/14 followed by coffee ground emesis   Bronchial hygeine  Was on nasal trumpet -> now  off  Tolerating extubation well now  On room air  CPAP at qhs, intermittent use d/t patient refusal  Urinary Tract Infection Leukocytosis, resolved  KUB w/o ileus, + hydroureteronephrosis -> outpt urology follow up  6/14 UA w/ WBC > 50 & UCx E coli  BCx no growth   Cefepime 6/14>>6/15  Meropenem 6/15>>6/17  Keflex 6/17>>6/21  Leukocytosis, WBC 11.1->15.6->23.2 ->18.3->12.5->11.1->10.8->10.3->10.4  6/26 U/A w/ WBC > 50   Urine culture 6/26 >100,000 colonies E coli  Rocephin restarted 6/26>>6/29  Deescalated to ancef 6/29>>  Hematuria, resolved  Gross hematuria x 2-3 days  Earlier this admission she has hydroureteronephrosis  Now has recurrent UTI  Has foley catheter, replaced 6/28  Hb 12.4->11.7->11.0->10.4->10.1->11.3->10.5->10.4  ASA 325 and plavix 75 -> ASA 81 only  Urology consulted Jeffie Pollock) - appreciate help  Exchanged foley catheter 6/28  Manual irrigation 6/30  No gross hematuria 04/11/20  AKI  Creatinine 1.12-1.51-1.66->1.71->1.30->1.22->1.17->1.25->1.18  continue IV fluid 75 cc/h  Encourage p.o. intake - pt intake improving  D/c lisinopril   BMP monitoring  Hypertension  Home meds:  amlodipine 5, lisinopril 20, metoprolol 100 bid   On Cleviprex -> off   BP stable   On amlodipine 10, metoprolol 25 bid  Lisinopril d/c'ed due to AKI  Long-term BP goal 130-150 given right MCA reocclusion  Hyperlipidemia  Home meds:  pravachol 40  TG >500->247, Direct LDL 63.8, goal < 70  pravastatin 40 -> held due to elevated LFTs -> restarted now  On zetia 10  AST/ALT 44/72->44/58->30/34  Continue statin and zetia on discharge.   Diabetes type II Uncontrolled  Home meds:  trulicity 8.33, glimepiride 1, metformin 500 bid, ozempic 0.25-0.5  HgbA1c 8.9, goal < 7.0  CBGs  SSI 0-9  Diabetic coordinator following  On Levemir to 10u Bid    Close PCP follow up for better DM control  Dysphagia  Secondary to stroke  Now  on Dys 1 and honey thick -> eating well so far  Cortrak placed -> removed 6/26  On IVF   Speech on board   Other Stroke Risk Factors  Advanced age  Family hx stroke (father)  Other Active Problems  Hypokalemia 3.4- 4.6->4.0->3.9->3.8->4.3->3.6-> 3.5 - resolved  Lethargy on amantadine - resolved   DISCHARGE EXAM Blood pressure (!) 156/64, pulse 69, temperature 99.2 F (37.3 C), temperature source Oral, resp. rate 20, weight 83.6 kg, SpO2 97 %. General- Well nourished, well developed, awake alert, no distress  Ophthalmologic- fundi not visualized due to noncooperation.  Cardiovascular - Regular rhythmand rate.  Neuro -patient is awake alert, eyes open, orientated to place, age, self but not to time. Mild dysarthria, follows simple commands. Left neglect. Eyes right gaze preference, not crossing midline. PERRL. Not blinking to visual threat on the left. Left facial droop. Tongue protrusion midline. LUE mild withdraw to pain. LLE1/5withdrawon pain stimulation. RUE spontaneous against gravity and purposeful. RLE 3/5 on pain stimulation. Sensation subjectively symmetrical, coordination intact left FTN and gait not tested.  Discharge Diet  Dysphagia 1 honey thick liquids  DISCHARGE PLAN  Disposition:  Transfer to [facility] for ongoing PT, OT and ST  aspirin 81 mg daily for secondary stroke prevention given hematuria. Consider increase to DAPT once hematuria resolved    Recommend ongoing stroke risk factor control by Primary Care Physician at time of discharge from inpatient rehabilitation.  Follow-up PCP Chevis Pretty, FNP in 2 weeks following discharge from rehab.  Follow-up in [facility] in 4 weeks following discharge from rehab, office to schedule an appointment.  Follow-up [facility] for implantable loop recorder wound check in 10-14 days, office to schedule an appointment.  Loop recorder to be  monitored by [facility] for atrial fibrillation as source of stroke. If found, they will notify patient. Follow-up [facility] in 3-4 weeks for hematuria evaluation, call for an appt.  40 minutes were spent preparing discharge.  Rosalin Hawking, MD PhD Stroke Neurology 04/11/2020 6:53 PM

## 2020-04-11 NOTE — Progress Notes (Signed)
°  Speech Language Pathology Treatment: Dysphagia;Cognitive-Linquistic  Patient Details [name] MRN: [id number] DOB: 04-13-1946 Today's Date: 04/11/2020 Time: 0940-1005 SLP Time Calculation (min) (ACUTE ONLY): 25 min  Assessment / Plan / Recommendation Clinical Impression  Session conducted during am meal. Pt continues to demonstrates prolonged oral holding, particularly when drinking or eating foods she is not interested in. Noted that after requesting water (honey and thin trialed) and taking sips, oral phase much swifter, with self feeding but also with heavy assist. Pt does still demonstrate immediate coughing with sips of thin water. Pts initiation and attention to mastication of solids also much improved with motivating foods. Pt eager to eat a nutrigrain bar at bedside. She needed intermittent verbal cues to sustain attention, but buccal residue was minimal and cleared with a reminder. Pt may upgrade to mech soft, but also would benefit from dentures. Discussed with RN and NT.  Targeted speech intelligibility, reasoning and awareness in discussion about 4th of July. With explicit verbal cues for each utterance pt increased volume and over-articulated to improve intelligibility. Provided context cues for pt to reason through date and description of typical family activities for the holiday.  Continues to need heavy cueing to initiate and participate.   HPI HPI: 74 y.o female admitted with sudden onset Lt sided weakness and slurred speech.  Tpa was administered.  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 Pt extubated 6/14, but had increasing lethargy > possibly septic due to UTI.  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 MBS 6/18 showed intermittent aspiration across consistencies; NPO was recommended and cortrak was placed.  Repeat MBS 6/23: persisting dysphagia marked by  oral holding, decreased initiation, and anterior left spillage and residue.  There was continued silent, trace aspiration of nectar thick liquids before the swallow; honey thick liquids were not aspirated. There was good pharyngeal clearance/no residue post-swallow. A puree diet with honey thick liquids was recommended at that time.       SLP Plan  Continue with current plan of care       Recommendations  Diet recommendations: Dysphagia 3 (mechanical soft);Honey-thick liquid Liquids provided via: Straw;Cup Medication Administration: Crushed with puree Supervision: Staff to assist with self feeding Compensations: Monitor for anterior loss;Small sips/bites;Slow rate;Follow solids with liquid Postural Changes and/or Swallow Maneuvers: Seated upright 90 degrees                General recommendations: Rehab consult Oral Care Recommendations: Oral care BID Follow up Recommendations: Inpatient Rehab SLP Visit Diagnosis: Dysphagia, oropharyngeal phase (R13.12);Dysarthria and anarthria (R47.1);Cognitive communication deficit (I14.431) Plan: Continue with current plan of care       GO              Harlon Ditty, MA CCC-SLP  Acute Rehabilitation Services Pager [phone] Office [phone]   Claudine Mouton 04/11/2020, 10:21 AM

## 2020-04-12 ENCOUNTER — Inpatient Hospital Stay (HOSPITAL_COMMUNITY): Payer: Medicare Other

## 2020-04-12 ENCOUNTER — Inpatient Hospital Stay (HOSPITAL_COMMUNITY): Payer: Medicare Other | Admitting: Physical Therapy

## 2020-04-12 ENCOUNTER — Inpatient Hospital Stay (HOSPITAL_COMMUNITY): Payer: Medicare Other | Admitting: Speech Pathology

## 2020-04-12 DIAGNOSIS — I63511 Cerebral infarction due to unspecified occlusion or stenosis of right middle cerebral artery: Secondary | ICD-10-CM

## 2020-04-12 LAB — GLUCOSE, CAPILLARY
Glucose-Capillary: 102 mg/dL — ABNORMAL HIGH (ref 70–99)
Glucose-Capillary: 134 mg/dL — ABNORMAL HIGH (ref 70–99)
Glucose-Capillary: 152 mg/dL — ABNORMAL HIGH (ref 70–99)
Glucose-Capillary: 214 mg/dL — ABNORMAL HIGH (ref 70–99)

## 2020-04-12 MED ORDER — PANTOPRAZOLE SODIUM 40 MG PO PACK
40.0000 mg | PACK | Freq: Every day | ORAL | Status: DC
  Administered 2020-04-13 – 2020-05-05 (×21): 40 mg
  Filled 2020-04-12 (×11): qty 20

## 2020-04-12 NOTE — Progress Notes (Signed)
[facility] PHYSICAL MEDICINE & REHABILITATION PROGRESS NOTE   Subjective/Complaints:  Increased tone LUE and LLE per OT   ROS- neg CP, SOB, N/V/D   Objective:   EP PPM/ICD IMPLANT  Result Date: 04/11/2020 SURGEON:  Loman Brooklyn, MD   PREPROCEDURE DIAGNOSIS:  Cryptogenic Stroke   POSTPROCEDURE DIAGNOSIS:  Cryptogenic Stroke    PROCEDURES:  1. Implantable loop recorder implantation   INTRODUCTION:  Meredith Shaw is a 74 y.o. female with a history of unexplained stroke who presents today for implantable loop implantation.  The patient has had a cryptogenic stroke.  Despite an extensive workup by neurology, no reversible causes have been identified.  she has worn telemetry during which she did not have arrhythmias.  There is significant concern for possible atrial fibrillation as the cause for the patients stroke.  The patient therefore presents today for implantable loop implantation.   DESCRIPTION OF PROCEDURE:  Informed written consent was obtained, and the patient was brought to the electrophysiology lab in a fasting state.  The patient required no sedation for the procedure today.  Mapping over the patient's chest was performed by the EP lab staff to identify the area where electrograms were most prominent for ILR recording.  This area was found to be the left parasternal region over the 3rd-4th intercostal space. The patients left chest was therefore prepped and draped in the usual sterile fashion by the EP lab staff. The skin overlying the left parasternal region was infiltrated with lidocaine for local analgesia.  A 0.5-cm incision was made over the left parasternal region over the 3rd intercostal space.  A subcutaneous ILR pocket was fashioned using a combination of sharp and blunt dissection.  A Medtronic Reveal Concord model [id number] implantable loop recorder was then placed into the pocket  R waves were very prominent and measured 0.38mV. EBL<1 ml.  Steri- Strips and a sterile  dressing were then applied.  There were no early apparent complications.   CONCLUSIONS:  1. Successful implantation of a Medtronic Reveal LINQ implantable loop recorder for cryptogenic stroke  2. No early apparent complications.   Recent Labs    04/10/20 0324 04/11/20 0529  WBC 10.3 10.4  HGB 10.5* 10.4*  HCT 32.1* 32.1*  PLT 335 348   Recent Labs    04/10/20 0324 04/11/20 0529  NA 140 138  K 3.6 3.5  CL 111 110  CO2 21* 20*  GLUCOSE 126* 210*  BUN 16 16  CREATININE 1.25* 1.18*  CALCIUM 8.7* 8.6*    Intake/Output Summary (Last 24 hours) at 04/12/2020 1610 Last data filed at 04/12/2020 0545 Gross per 24 hour  Intake 370 ml  Output 1000 ml  Net -630 ml     Physical Exam: Vital Signs Blood pressure (!) 142/87, pulse 61, temperature 98.1 F (36.7 C), temperature source Oral, resp. rate 18, height 5\' 7"  (1.702 m), weight 74.9 kg, SpO2 100 %.  General: No acute distress Mood and affect are appropriate Heart: Regular rate and rhythm no rubs murmurs or extra sounds Lungs: Clear to auscultation, breathing unlabored, no rales or wheezes Abdomen: Positive bowel sounds, soft nontender to palpation, nondistended Extremities: No clubbing, cyanosis, or edema Skin: No evidence of breakdown, no evidence of rash Neurologic: Cranial nerves II through XII intact, motor strength is 5/5 in right and 0/5 left deltoid, bicep, tricep, grip, hip flexor, knee extensors, ankle dorsiflexor and plantar flexor Sensory exam reduced pinch on left but poor overall awareness of testing  Musculoskeletal: Full range  of motion in all 4 extremities. No joint swelling     Assessment/Plan: 1. 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  which require 3+ hours per day of interdisciplinary therapy in a comprehensive inpatient rehab setting.  Physiatrist is providing close team supervision and 24 hour management of active medical problems listed below.  Physiatrist and rehab team continue to assess  barriers to discharge/monitor patient progress toward functional and medical goals  Care Tool:  Bathing              Bathing assist       Upper Body Dressing/Undressing Upper body dressing   What is the patient wearing?: Hospital gown only    Upper body assist Assist Level: Maximal Assistance - Patient 25 - 49%    Lower Body Dressing/Undressing Lower body dressing            Lower body assist       Toileting Toileting    Toileting assist Assist for toileting: Maximal Assistance - Patient 25 - 49%     Transfers Chair/bed transfer  Transfers assist           Locomotion Ambulation   Ambulation assist              Walk 10 feet activity   Assist           Walk 50 feet activity   Assist           Walk 150 feet activity   Assist           Walk 10 feet on uneven surface  activity   Assist           Wheelchair     Assist               Wheelchair 50 feet with 2 turns activity    Assist            Wheelchair 150 feet activity     Assist          Blood pressure (!) 142/87, pulse 61, temperature 98.1 F (36.7 C), temperature source Oral, resp. rate 18, height 5\' 7"  (1.702 m), weight 74.9 kg, SpO2 100 %.  Medical Problem List and Plan: 1.  Left side weakness with dysarthria and dysphagia secondary to right MCA infarction status post TPA and IR with TIC13 revascularization reoccluded on follow-up, infarct embolic secondary to unknown source.  Status post loop recorder             -patientmay shower             -ELOS/Goals: 23-28 days/Min/Mod A             Admit to CIR 2.  Antithrombotics: -DVT/anticoagulation: Subcutaneous heparin             -antiplatelet therapy: Aspirin 81 mg daily 3. Pain Management: Tylenol as needed             Monitor for headaches with increased activity. 4. Mood: Amantadine 150 mg daily             -antipsychotic agents: N/A 5. Neuropsych: This patient is not  capable of making decisions on her own behalf. 6. Skin/Wound Care: Routine skin checks 7. Fluids/Electrolytes/Nutrition: Routine in and outs. CMP ordered.  8.  Hypertension.  Lopressor 25 mg twice daily, Norvasc 10 mg daily.               Monitor with increased mobility 9.  Post stroke dysphagia: Dysphagia #1  honey thick liquids.  Patient on IV fluids for hydration follow-up speech therapy             Advance diet as tolerated 10.  Hemorrhagic cystitis complicated by UTI.  Complete course of Ancef.  Follow-up outpatient urology 11.  Diabetes mellitus with peripheral neuropathy.  Hemoglobin A1c 8.9.  Levemir 10 units twice daily.  Check blood sugars before meals and at bedtime CBG (last 3)  Recent Labs    04/11/20 1632 04/11/20 2148 04/12/20 0602  GLUCAP 158* 151* 102*                Monitor with increased mobility 12.  Hyperlipidemia.  Zetia/Pravacol 13.  Constipation.  MiraLAX. Adjust bowel meds as necessary    LOS: 1 days A FACE TO FACE EVALUATION WAS PERFORMED  Erick Colace 04/12/2020, 9:52 AM

## 2020-04-12 NOTE — Evaluation (Signed)
Physical Therapy Assessment and Plan  Patient Details  [name] MRN: [id number] Date of Birth: March 25, 1946  PT Diagnosis: Abnormal posture, Abnormality of gait, Cognitive deficits, Difficulty walking, Hemiparesis non-dominant, Hypertonia, Impaired cognition, Impaired sensation, Muscle spasms and Muscle weakness Rehab Potential: Fair ELOS: ~4 weeks   Today's Date: 04/12/2020 PT Individual Time: 1093-2355 PT Individual Time Calculation (min): 57 min    Hospital Problem: Active Problems:   Pressure injury of skin   Right middle cerebral artery stroke [facility])   Past Medical History:  Past Medical History:  Diagnosis Date  . Arthritis    OA AND PAIN BOTH KNEES -RIGHT KNEE HURTS WORSE THAN LEFT  . Cataract   . Diabetes mellitus    ORAL MEDICATION - NO INSULIN  . Hypertension   . Hypertensive retinopathy    OU  . Hypothyroidism   . Kidney stones    NONE AT PRESENT TIME THAT PT IS AWARE OF  . Osteoporosis    Past Surgical History:  Past Surgical History:  Procedure Laterality Date  . ABDOMINAL HYSTERECTOMY    . BUBBLE STUDY  03/26/2020   Procedure: BUBBLE STUDY;  Surgeon: Dorothy Spark, MD;  Location: [place];  Service: Cardiovascular;;  . CATARACT EXTRACTION    . CHOLECYSTECTOMY  1972  . COLONOSCOPY    . EYE SURGERY     RIGHT CATARACT EXTRACTON WITH LENS IMPLANT  . GROWTH REMOVED FROM THYROID    . INTRAMEDULLARY (IM) NAIL INTERTROCHANTERIC Left 02/10/2018   Procedure: INTRAMEDULLARY (IM) NAIL INTERTROCHANTRIC;  Surgeon: Mcarthur Rossetti, MD;  Location: [place];  Service: Orthopedics;  Laterality: Left;  . INTRAMEDULLARY (IM) NAIL INTERTROCHANTERIC Right 08/20/2018   Procedure: ORIF RIGHT HIP INTERTROC FRACTURE;  Surgeon: Mcarthur Rossetti, MD;  Location: [place];  Service: Orthopedics;  Laterality: Right;  . IR CT HEAD LTD  03/24/2020  . IR PERCUTANEOUS ART THROMBECTOMY/INFUSION INTRACRANIAL INC DIAG ANGIO  03/24/2020  . left hip surgery   02/2018   . ORIF HIP FRACTURE Right 08/20/2018  . PERCUTANEOUS NEPHROLITHOTOMY    . RADIOLOGY WITH ANESTHESIA N/A 03/23/2020   Procedure: IR WITH ANESTHESIA;  Surgeon: Luanne Bras, MD;  Location: [place];  Service: Radiology;  Laterality: N/A;  . TEE WITHOUT CARDIOVERSION N/A 03/26/2020   Procedure: TRANSESOPHAGEAL ECHOCARDIOGRAM (TEE);  Surgeon: Dorothy Spark, MD;  Location: [facility];  Service: Cardiovascular;  Laterality: N/A;  . TOTAL KNEE ARTHROPLASTY  06/09/2012   Procedure: TOTAL KNEE ARTHROPLASTY;  Surgeon: Mcarthur Rossetti, MD;  Location: [facility];  Service: Orthopedics;  Laterality: Right;  Right Total Knee Arthroplasty  . TOTAL KNEE ARTHROPLASTY Left 04/09/2014   Procedure: LEFT TOTAL KNEE ARTHROPLASTY;  Surgeon: Mcarthur Rossetti, MD;  Location: [place];  Service: Orthopedics;  Laterality: Left;  . TUBAL LIGATION      Assessment & Plan Clinical Impression: Patient is a 74 y.o. year old right handed female with history of type 2 diabetes mellitus complicated by retinopathy and hypertension.  History taken from chart review and husband due to cognition.  Patient lives with spouse.  1 level home 2 steps to entry.  Patient reportedly ambulating without assistive device prior to admission but sedentary.  She presented on 03/23/2020 with dysarthria and left hemiparesis. CT of the head was negative and TPA was administered.  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  She underwent cerebral angiogram with revascularization per interventional radiology.  She did require short-term intubation.  Hospital  course complicated by coffee-ground emesis and low-grade fever worsening of mental status.  Due to concerns for urosepsis she was started on IV meropenem for a short time.  Repeat CT of the head showed progressive right MCA infarct with evidence of reocclusion.  Follow-up MRI personally reviewed, showing multiple >> right MCA infarcts. Per  report, large evolving acute ischemic right MCA territory infarct with associated petechial hemorrhage, gyral swelling/edema with mass regional mass-effect and additional scattered nonhemorrhagic infarcts in the right PCA territory, left frontal white matter and loss of flow with reocclusion distal right M1 M2 occlusion.  Bilateral lower extremity Dopplers negative.  Echocardiogram with EF of 70-75% with possible echodensity attached to the posterior mitral valve.  TEE ordered for work-up was negative for vegetation or thrombus and bubble study negative.  EEG completed due to lethargy showed sharp waves in right frontal region and slow generalized lateralization in right hemisphere due to underlying infarct.  No definite seizure identified.  Urology was consulted 04/07/2020 Dr. Jeffie Pollock for hematuria, felt to be related to anticoagulation with noted urine cultures Klebsiella and placed on Ancep.  A recent renal ultrasound was completed during hospital work-up showed no mass or hydronephrosis.  Patient had been on aspirin and Plavix for CVA prophylaxis Plavix discontinued due to bouts of hematuria.  Patient has been cleared for subcutaneous heparin for DVT prophylaxis.  Hospital course further complicated post stroke dysphagia.  Started on dysphagia #1 honey thick liquids and nasogastric tube removed 04/05/2020.  Therapy evaluations completed and patient was admitted for a comprehensive rehab program. Patient transferred to CIR on 04/11/2020 .   Patient currently requires total assist with mobility secondary to muscle weakness, muscle joint tightness and muscle paralysis, decreased cardiorespiratoy endurance, impaired timing and sequencing, abnormal tone, unbalanced muscle activation, motor apraxia, decreased coordination and decreased motor planning, decreased visual perceptual skills and decreased visual motor skills, decreased attention to left, decreased initiation, decreased attention, decreased awareness, decreased  problem solving, decreased safety awareness, decreased memory and delayed processing and decreased sitting balance, decreased standing balance, decreased postural control and decreased balance strategies.  Prior to hospitalization, patient was independent  with mobility and lived with Spouse in a House home.  Home access is  Level entry.  Patient will benefit from skilled PT intervention to maximize safe functional mobility, minimize fall risk and decrease caregiver burden for planned discharge home with 24 hour assist.  Anticipate patient will benefit from follow up [facility] at discharge.  PT - End of Session Activity Tolerance: Tolerates 30+ min activity with multiple rests Endurance Deficit: Yes Endurance Deficit Description: quick to fall asleep at end of session PT Assessment Rehab Potential (ACUTE/IP ONLY): Fair PT Barriers to Discharge: [place] home environment;Decreased caregiver support;Medical stability;Home environment access/layout;Neurogenic Bowel & Bladder;Lack of/limited family support;Behavior PT Patient demonstrates impairments in the following area(s): Balance;Perception;Behavior;Safety;Edema;Sensory;Endurance;Skin Integrity;Motor;Nutrition;Pain PT Transfers Functional Problem(s): Bed Mobility;Bed to Chair;Car;Furniture PT Locomotion Functional Problem(s): Ambulation;Wheelchair Mobility;Stairs PT Plan PT Intensity: Minimum of 1-2 x/day ,45 to 90 minutes PT Frequency: 5 out of 7 days PT Duration Estimated Length of Stay: ~4 weeks PT Treatment/Interventions: Ambulation/gait training;DME/adaptive equipment instruction;Community reintegration;Neuromuscular re-education;Psychosocial support;Stair training;UE/LE Strength taining/ROM;Wheelchair propulsion/positioning;Balance/vestibular training;Discharge planning;Functional electrical stimulation;Pain management;Skin care/wound management;Therapeutic Activities;UE/LE Coordination activities;Cognitive remediation/compensation;Disease  management/prevention;Functional mobility training;Patient/family education;Splinting/orthotics;Therapeutic Exercise;Visual/perceptual remediation/compensation PT Transfers Anticipated Outcome(s): min assist PT Locomotion Anticipated Outcome(s): TBD if pt will be a functional ambulator PT Recommendation Follow Up Recommendations: Home health PT;24 hour supervision/assistance Patient destination: Home Equipment Recommended: To be determined  Skilled Therapeutic Intervention Evaluation completed (  see details above and below) with education on PT POC and goals and individual treatment initiated with focus on bed mobility, sitting balance, upright activity tolerance, and pt education regarding daily therapy schedule, weekly team meetings, purpose of PT evaluation, and other CIR information. Pt received supine in bed and agreeable to therapy session. RN present setting up IV. Pt slow to respond to questions but answers appropriately for simple questions. Has R cervical rotation preference with L inattention (able to visually scan ~45degrees to L and sustain attention for ~10seconds). Demonstrates L LE hypertonia with flexor withdrawal upon tactile stimulus or when going to assist with movement of that LE - has L UE hypertonia with significant internal rotation preference. Therapist retrieved a TIS wheelchair with roho hybrid cushion to allow increased upright, OOB activity and sufficient pressure relief. Supine>sitting R EOB with total assist for B LE management off EOB and trunk upright - max/total assist for rolling - max cuing for sequencing to increase pt participation. Sitting EOB requires max assist for trunk control to avoid L posterolateral LOB - performed R lateral trunk lean onto forearm support then returned to midline with pt able to maintain static sitting with R UE support and close supervision for ~1 minute. Participated in sitting EOB trunk control, midline orientation, and L attention task via  sustained static sitting in midline with R UE support (therapist providing visual feedback and verbal cuing to avoid L posterolateral LOB and achieve midline) as well as R lateral trunk lean onto forearm support.  +2 available therefore attempted sit<>stand x3 trials from elevated EOB with +2 total assist for lifting and pt able to partially clear hips from bed but unable to initiate trunk/hip extension to bring trunk upright. +2 assistance no longer available and unsafe to attempt lateral scoot transfer to TIS w/c using transfer board without 2nd person (therapist placed equipment in room for future sessions). Sit>supine with total assist for trunk descent and B LE management into the bed. Therapist assisted pt with repositioning with L UE therapeutically positioned on pillows to be elevated and avoid excessive internal rotation. Therapist educated pt's RN on 2hour rolling scheduled and to initiate timed toileting protocol (signs placed above pt's bed with pictures to reference for proper positioning). Pt left supine in bed with needs in reach and bed alarm on - pt demonstrates understanding use of call button. .  PT Evaluation Precautions/Restrictions Precautions Precautions: Fall;Other (comment) Precaution Comments: L hemi with hypertonia and L inattention Restrictions Weight Bearing Restrictions: No Pain Pain Assessment Pain Scale: 0-10 Pain Score: 0-No pain Home Living/Prior Functioning Home Living Available Help at Discharge: Family;Available 24 hours/day Type of Home: House Home Access: Level entry Home Layout: One level  Lives With: Spouse Prior Function Level of Independence: Independent with basic ADLs;Independent with homemaking with ambulation;Independent with transfers;Independent with gait Driving: No Comments: per chart pt was independent without AD but was sedentary Perception  Perception Perception: Impaired Inattention/Neglect: Does not attend to left side of body;Does  not attend to left visual field Praxis Praxis: Impaired Praxis Impairment Details: Initiation;Motor planning  Cognition Overall Cognitive Status: Impaired/Different from baseline Arousal/Alertness: Awake/alert Orientation Level: Oriented to person;Oriented to place;Oriented to situation;Disoriented to time (reports it is June - but knows year) Attention: Sustained Sustained Attention: Impaired Memory: Impaired Awareness: Impaired Problem Solving: Impaired Safety/Judgment: Impaired Comments: left inattention Sensation Sensation Light Touch: Impaired by gross assessment Light Touch Impaired Details: Absent LUE;Absent LLE (also impacted by L inattention) Hot/Cold: Not tested Proprioception: Impaired by gross assessment  Stereognosis: Not tested Coordination Gross Motor Movements are Fluid and Coordinated: No Coordination and Movement Description: impaired due to L hemiparesis, L UE and LE hypertonia, impaired turnk control, L inattention, and impaired motor planning Heel Shin Test: unable to perform with L LE and difficulty understanding instructions to perform with R LE Motor  Motor Motor: Hemiplegia;Abnormal tone;Abnormal postural alignment and control Motor - Skilled Clinical Observations: L hemiparesis; L hypertonia  Mobility Bed Mobility Bed Mobility: Supine to Sit;Sit to Supine;Rolling Right;Rolling Left Rolling Right: Total Assistance - Patient < 25% Rolling Left: Total Assistance - Patient < 25% Supine to Sit: Total Assistance - Patient < 25% Sit to Supine: Total Assistance - Patient < 25% Transfers Transfers: Sit to Stand;Stand to Sit Sit to Stand: 2 Helpers (unable to come to a full stand) Stand to Sit: 2 Helpers Locomotion  Gait Ambulation: No Gait Gait: No Stairs / Additional Locomotion Stairs: No Wheelchair Mobility Wheelchair Mobility: No  Trunk/Postural Assessment  Cervical Assessment Cervical Assessment: Exceptions to [facility] (R gaze preference; able to  rotate L to midline) Thoracic Assessment Thoracic Assessment: Exceptions to [facility] (thoracic kyphosis with rounded shoulders) Lumbar Assessment Lumbar Assessment: Exceptions to [facility] (posterior pelvic tilt) Postural Control Postural Control: Deficits on evaluation Trunk Control: impaired with L posteiror leean/LOB requiring assist and R UE support to maintain upright Righting Reactions: delayed and insufficient Protective Responses: delayed and insufficient Postural Limitations: decreased with impaired core muscle strength  Balance Balance Balance Assessed: Yes Static Sitting Balance Static Sitting - Level of Assistance: 2: Max assist (up to max assist to prevent LOB) Dynamic Sitting Balance Dynamic Sitting - Balance Support: Feet supported Dynamic Sitting - Level of Assistance: 1: +1 Total assist Extremity Assessment  RLE Assessment RLE Assessment: Exceptions to [facility] General Strength Comments: demonstrates occasional spasm type movement in R LE RLE Strength Right Hip Flexion: 3+/5 Right Knee Flexion: 4-/5 Right Knee Extension: 4/5 Right Ankle Dorsiflexion: 4-/5 Right Ankle Plantar Flexion: 4-/5 LLE Assessment LLE Assessment: Exceptions to [facility] Passive Range of Motion (PROM) Comments: flexor tone starts causing resistance to passive movement into extension but with time does demonstrate ability to rest LE flat on bed General Strength Comments: unable to formally assess strength due to LE drawing up into flexor tone upon attempts at tactile stimulation or movement - pt unable to initiate movement outside of this pattern LLE Tone LLE Tone: Hypertonic;Severe    Refer to Care Plan for Long Term Goals  Recommendations for other services: None  at this time  Discharge Criteria: Patient will be discharged from PT if patient refuses treatment 3 consecutive times without medical reason, if treatment goals not met, if there is a change in medical status, if patient makes no progress towards  goals or if patient is discharged from hospital.  The above assessment, treatment plan, treatment alternatives and goals were discussed and mutually agreed upon: by patient  Tawana Scale, PT, DPT, CSRS  04/12/2020, 12:35 PM

## 2020-04-12 NOTE — Evaluation (Signed)
Speech Language Pathology Assessment and Plan  Patient Details  [name] MRN: [id number] Date of Birth: April 08, 1946  SLP Diagnosis: Dysarthria;Cognitive Impairments;Dysphagia  Rehab Potential: Fair ELOS: 21-28 days    Today's Date: 04/12/2020 SLP Individual Time: 6073-7106 SLP Individual Time Calculation (min): 55 min   Hospital Problem: Active Problems:   Pressure injury of skin   Right middle cerebral artery stroke [facility]  Past Medical History:  Past Medical History:  Diagnosis Date  . Arthritis    OA AND PAIN BOTH KNEES -RIGHT KNEE HURTS WORSE THAN LEFT  . Cataract   . Diabetes mellitus    ORAL MEDICATION - NO INSULIN  . Hypertension   . Hypertensive retinopathy    OU  . Hypothyroidism   . Kidney stones    NONE AT PRESENT TIME THAT PT IS AWARE OF  . Osteoporosis    Past Surgical History:  Past Surgical History:  Procedure Laterality Date  . ABDOMINAL HYSTERECTOMY    . BUBBLE STUDY  03/26/2020   Procedure: BUBBLE STUDY;  Surgeon: Dorothy Spark, MD;  Location: [place];  Service: Cardiovascular;;  . CATARACT EXTRACTION    . CHOLECYSTECTOMY  1972  . COLONOSCOPY    . EYE SURGERY     RIGHT CATARACT EXTRACTON WITH LENS IMPLANT  . GROWTH REMOVED FROM THYROID    . INTRAMEDULLARY (IM) NAIL INTERTROCHANTERIC Left 02/10/2018   Procedure: INTRAMEDULLARY (IM) NAIL INTERTROCHANTRIC;  Surgeon: Mcarthur Rossetti, MD;  Location: [place];  Service: Orthopedics;  Laterality: Left;  . INTRAMEDULLARY (IM) NAIL INTERTROCHANTERIC Right 08/20/2018   Procedure: ORIF RIGHT HIP INTERTROC FRACTURE;  Surgeon: Mcarthur Rossetti, MD;  Location: [place];  Service: Orthopedics;  Laterality: Right;  . IR CT HEAD LTD  03/24/2020  . IR PERCUTANEOUS ART THROMBECTOMY/INFUSION INTRACRANIAL INC DIAG ANGIO  03/24/2020  . left hip surgery   02/2018  . ORIF HIP FRACTURE Right 08/20/2018  . PERCUTANEOUS NEPHROLITHOTOMY    . RADIOLOGY WITH ANESTHESIA N/A 03/23/2020   Procedure: IR  WITH ANESTHESIA;  Surgeon: Luanne Bras, MD;  Location: [place];  Service: Radiology;  Laterality: N/A;  . TEE WITHOUT CARDIOVERSION N/A 03/26/2020   Procedure: TRANSESOPHAGEAL ECHOCARDIOGRAM (TEE);  Surgeon: Dorothy Spark, MD;  Location: [facility] ENDOSCOPY;  Service: Cardiovascular;  Laterality: N/A;  . TOTAL KNEE ARTHROPLASTY  06/09/2012   Procedure: TOTAL KNEE ARTHROPLASTY;  Surgeon: Mcarthur Rossetti, MD;  Location: [facility];  Service: Orthopedics;  Laterality: Right;  Right Total Knee Arthroplasty  . TOTAL KNEE ARTHROPLASTY Left 04/09/2014   Procedure: LEFT TOTAL KNEE ARTHROPLASTY;  Surgeon: Mcarthur Rossetti, MD;  Location: [place];  Service: Orthopedics;  Laterality: Left;  . TUBAL LIGATION      Assessment / Plan / Recommendation Clinical Impression   Meredith Shaw  is a 74 year old right handed with history of type 2 diabetes mellitus complicated by retinopathy and hypertension.  History taken from chart review and husband due to cognition.  Patient lives with spouse.  1 level home 2 steps to entry.  Patient reportedly ambulating without assistive device prior to admission but sedentary.  She presented on 03/23/2020 with dysarthria and left hemiparesis. CT of the head was negative and TPA was administered.  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  She underwent cerebral angiogram with revascularization per interventional radiology.  She did require short-term intubation.  Hospital course complicated by coffee-ground emesis and low-grade fever worsening of mental status.  Due to  concerns for urosepsis she was started on IV meropenem for a short time.  Repeat CT of the head showed progressive right MCA infarct with evidence of reocclusion.  Follow-up MRI personally reviewed, showing multiple >> right MCA infarcts. Per report, large evolving acute ischemic right MCA territory infarct with associated petechial hemorrhage, gyral  swelling/edema with mass regional mass-effect and additional scattered nonhemorrhagic infarcts in the right PCA territory, left frontal white matter and loss of flow with reocclusion distal right M1 M2 occlusion.  Bilateral lower extremity Dopplers negative.  Echocardiogram with EF of 70-75% with possible echodensity attached to the posterior mitral valve.  TEE ordered for work-up was negative for vegetation or thrombus and bubble study negative.  EEG completed due to lethargy showed sharp waves in right frontal region and slow generalized lateralization in right hemisphere due to underlying infarct.  No definite seizure identified.  Urology was consulted 04/07/2020 Dr. Jeffie Pollock for hematuria, felt to be related to anticoagulation with noted urine cultures Klebsiella and placed on Ancep.  A recent renal ultrasound was completed during hospital work-up showed no mass or hydronephrosis.  Patient had been on aspirin and Plavix for CVA prophylaxis Plavix discontinued due to bouts of hematuria.  Patient has been cleared for subcutaneous heparin for DVT prophylaxis.  Hospital course further complicated post stroke dysphagia.  Started on dysphagia #1 honey thick liquids and nasogastric tube removed 04/05/2020.  Therapy evaluations completed and patient was admitted for a comprehensive rehab program. Please see preadmission assessment from earlier today as well.  SLP evaluation was completed on 04/12/20 with results as follows:  Bedside Swallow Evaluation: Pt presents with poor containment of thickened liquids and pureed boluses due to left sided oral motor weakness.  Her oral phase was also prolonged due to decreased attention to boluses. Pt had the most efficient containment and transit of honey thick liquids when administered via teaspoon as the spoon facilitated improved labial seal and pt actually consumed more via teaspoon than via cup sips or straw sips.  Previous ST reports indicate that pt may be advanced to a mech  soft diet but orders at the time of today's evaluation were for dys 1 textures, honey thick liquids.  Given the extent of assistance needed for pt to consume small amounts of puree and honey thick liquids during assessment, I would suggest that pt remain on currently prescribed diet with trials of advanced textures with SLP as mentation allows.  Cognitive-linguistic evaluation: Pt presents with severe cognitive deficits characterized by slowed processing with increased response latency, left inattention, decreased sustained attention to tasks, decreased functional problem solving, decreased recall of information, and fluctuating orientation and arousal.  As a result, pt currently requires max to total assist for basic tasks.  Pt also has a mild dysarthria resulting from oral motor weakness which leads to imprecise articulation of consonants; however, at pt's most alert and attentive she is fully intelligible.  I suspect that her intelligibility will continue to fluctuate with her level of alertness and as a result I will defer setting short term goals for dysarthria at this time to focus on cognition and dysphagia.    Given the abovementioned deficits, pt would benefit from skilled ST while inpatient in order to maximize functional independence and reduce burden of care prior to discharge.  Anticipate that pt will need 24/7 supervision at discharge in addition to [place] follow up at next level of care.    Skilled Therapeutic Interventions  Cognitive-linguistic evaluation completed with results and recommendations reviewed with family.     SLP Assessment  Patient will need skilled Speech Lanaguage Pathology Services during CIR admission    Recommendations  SLP Diet Recommendations: Dysphagia 1 (Puree);Honey Liquid Administration via: Spoon;Cup;Straw Medication Administration: Crushed with puree Supervision: Staff to assist with self feeding;Full supervision/cueing for compensatory  strategies Compensations: Monitor for anterior loss;Small sips/bites;Slow rate;Follow solids with liquid Postural Changes and/or Swallow Maneuvers: Seated upright 90 degrees Oral Care Recommendations: Oral care BID Patient destination: Home Follow up Recommendations: Home Health SLP;24 hour supervision/assistance Equipment Recommended: None recommended by SLP    SLP Frequency 3 to 5 out of 7 days   SLP Duration  SLP Intensity  SLP Treatment/Interventions 21-28 days  Minumum of 1-2 x/day, 30 to 90 minutes  Cognitive remediation/compensation;Cueing hierarchy;Environmental controls;Dysphagia/aspiration precaution training;Internal/external aids;Speech/Language facilitation;Patient/family education    Pain Pain Assessment Pain Scale: 0-10 Pain Score: 0-No pain  Prior Functioning Cognitive/Linguistic Baseline: Within functional limits Type of Home: House  Lives With: Spouse Available Help at Discharge: Family;Available 24 hours/day  SLP Evaluation Cognition Overall Cognitive Status: Impaired/Different from baseline Arousal/Alertness: Awake/alert Orientation Level: Oriented to person;Oriented to place;Disoriented to time Attention: Sustained Sustained Attention: Impaired Sustained Attention Impairment: Verbal basic;Functional basic Memory: Impaired Memory Impairment: Storage deficit;Retrieval deficit;Decreased recall of new information Immediate Memory Recall: Sock;Blue;Bed Memory Recall Sock: Not able to recall Memory Recall Blue: Not able to recall Memory Recall Bed: Not able to recall Awareness: Impaired Awareness Impairment: Intellectual impairment Problem Solving: Impaired Problem Solving Impairment: Verbal basic;Functional basic Safety/Judgment: Impaired Comments: left inattention  Comprehension Auditory Comprehension Overall Auditory Comprehension: Appears within functional limits for tasks assessed Expression Expression Primary Mode of Expression:  Verbal Verbal Expression Overall Verbal Expression: Appears within functional limits for tasks assessed Oral Motor Oral Motor/Sensory Function Overall Oral Motor/Sensory Function: Moderate impairment Facial ROM: Reduced left Facial Symmetry: Abnormal symmetry left Facial Strength: Reduced left Lingual ROM: Reduced left Lingual Symmetry: Abnormal symmetry left Lingual Strength: Reduced Velum: Within Functional Limits Motor Speech Overall Motor Speech: Impaired Respiration: Within functional limits Phonation: Normal Resonance: Within functional limits Articulation: Impaired Level of Impairment: Sentence Intelligibility: Intelligibility reduced Sentence: 50-74% accurate Motor Planning: Witnin functional limits   Bedside Swallowing Assessment General Date of Onset: 03/23/20 Previous Swallow Assessment: MBS 04/02/20 Diet Prior to this Study: Dysphagia 1 (puree);Honey-thick liquids Temperature Spikes Noted: No Respiratory Status: Room air History of Recent Intubation: Yes Length of Intubations (days): 1 days Date extubated: 03/24/20 Behavior/Cognition: Alert;Cooperative;Pleasant mood Oral Cavity - Dentition: Edentulous Self-Feeding Abilities: Needs assist Patient Positioning: Partially reclined Baseline Vocal Quality: Normal  Oral Care Assessment   Ice Chips   Thin Liquid   Nectar Thick   Honey Thick Honey Thick Liquid: Impaired Oral Phase Impairments: Reduced labial seal Oral Phase Functional Implications: Left anterior spillage Pharyngeal Phase Impairments: Suspected delayed Swallow Puree Puree: Impaired Oral Phase Impairments: Reduced labial seal Oral Phase Functional Implications: Left anterior spillage Solid   BSE Assessment Risk for Aspiration Impact on safety and function: Mild aspiration risk;Moderate aspiration risk Other Related Risk Factors: Cognitive impairment  Short Term Goals: Week 1: SLP Short Term Goal 1 (Week 1): Pt will consume dys 1  textures and honey thick liquids with mod assist for use of swallowing strategies to mitigate left anterior spillage of boluses. SLP Short Term Goal 2 (Week 1): Pt will consume therapeutic trials of dys 2 solids with mod assist cues for use of swallowing strategies to clear boluses from the oral cavity. SLP Short Term Goal  3 (Week 1): Pt will consume therapeutic trials of thin liquids over 3 consecutive sessions with out respiratory decompensation prior to repeat MBS. SLP Short Term Goal 4 (Week 1): Pt sill sustain her attention to basic familiar tasks for 3 minute intervals with mod assist multimodal cues for redirection. SLP Short Term Goal 5 (Week 1): Pt will complete basic, familiar tasks with max assist  multimodal cues for functional problem solving. SLP Short Term Goal 6 (Week 1): Pt will locate items to the left of midline during basic, familiar tasks with max assist multimodal cues.  Refer to Care Plan for Long Term Goals  Recommendations for other services: None   Discharge Criteria: Patient will be discharged from SLP if patient refuses treatment 3 consecutive times without medical reason, if treatment goals not met, if there is a change in medical status, if patient makes no progress towards goals or if patient is discharged from hospital.  The above assessment, treatment plan, treatment alternatives and goals were discussed and mutually agreed upon: by patient and by family  Emilio Math 04/12/2020, 12:43 PM

## 2020-04-12 NOTE — Progress Notes (Signed)
Orthopedic Tech Progress Note Patient Details:  Meredith Shaw 1946/09/20 [id number] Called in brace Patient ID: Meredith Shaw, female   DOB: 1946-01-06, 74 y.o.   MRN: [id number]   Michelle Piper 04/12/2020, 11:41 AM

## 2020-04-12 NOTE — Progress Notes (Signed)
RT offered CPAP dream station to pt for the night and pt declined. Pt respiratory status is stable at this time. RT will continue to monitor.

## 2020-04-12 NOTE — Evaluation (Signed)
Occupational Therapy Assessment and Plan  Patient Details  [name] MRN: [id number] Date of Birth: 08/04/1946  OT Diagnosis: abnormal posture, apraxia, cognitive deficits, disturbance of vision, hemiplegia affecting non-dominant side, muscle weakness (generalized) and swelling of limb Rehab Potential:   ELOS: 24-28   Today's Date: 04/12/2020 OT Individual Time: 0900-1015 OT Individual Time Calculation (min): 75 min     Hospital Problem: Active Problems:   Pressure injury of skin   Right middle cerebral artery stroke [facility])   Past Medical History:  Past Medical History:  Diagnosis Date  . Arthritis    OA AND PAIN BOTH KNEES -RIGHT KNEE HURTS WORSE THAN LEFT  . Cataract   . Diabetes mellitus    ORAL MEDICATION - NO INSULIN  . Hypertension   . Hypertensive retinopathy    OU  . Hypothyroidism   . Kidney stones    NONE AT PRESENT TIME THAT PT IS AWARE OF  . Osteoporosis    Past Surgical History:  Past Surgical History:  Procedure Laterality Date  . ABDOMINAL HYSTERECTOMY    . BUBBLE STUDY  03/26/2020   Procedure: BUBBLE STUDY;  Surgeon: Dorothy Spark, MD;  Location: [place];  Service: Cardiovascular;;  . CATARACT EXTRACTION    . CHOLECYSTECTOMY  1972  . COLONOSCOPY    . EYE SURGERY     RIGHT CATARACT EXTRACTON WITH LENS IMPLANT  . GROWTH REMOVED FROM THYROID    . INTRAMEDULLARY (IM) NAIL INTERTROCHANTERIC Left 02/10/2018   Procedure: INTRAMEDULLARY (IM) NAIL INTERTROCHANTRIC;  Surgeon: Mcarthur Rossetti, MD;  Location: [place];  Service: Orthopedics;  Laterality: Left;  . INTRAMEDULLARY (IM) NAIL INTERTROCHANTERIC Right 08/20/2018   Procedure: ORIF RIGHT HIP INTERTROC FRACTURE;  Surgeon: Mcarthur Rossetti, MD;  Location: [place];  Service: Orthopedics;  Laterality: Right;  . IR CT HEAD LTD  03/24/2020  . IR PERCUTANEOUS ART THROMBECTOMY/INFUSION INTRACRANIAL INC DIAG ANGIO  03/24/2020  . left hip surgery   02/2018  . ORIF HIP FRACTURE Right  08/20/2018  . PERCUTANEOUS NEPHROLITHOTOMY    . RADIOLOGY WITH ANESTHESIA N/A 03/23/2020   Procedure: IR WITH ANESTHESIA;  Surgeon: Luanne Bras, MD;  Location: [place];  Service: Radiology;  Laterality: N/A;  . TEE WITHOUT CARDIOVERSION N/A 03/26/2020   Procedure: TRANSESOPHAGEAL ECHOCARDIOGRAM (TEE);  Surgeon: Dorothy Spark, MD;  Location: [facility] ENDOSCOPY;  Service: Cardiovascular;  Laterality: N/A;  . TOTAL KNEE ARTHROPLASTY  06/09/2012   Procedure: TOTAL KNEE ARTHROPLASTY;  Surgeon: Mcarthur Rossetti, MD;  Location: [facility];  Service: Orthopedics;  Laterality: Right;  Right Total Knee Arthroplasty  . TOTAL KNEE ARTHROPLASTY Left 04/09/2014   Procedure: LEFT TOTAL KNEE ARTHROPLASTY;  Surgeon: Mcarthur Rossetti, MD;  Location: [place];  Service: Orthopedics;  Laterality: Left;  . TUBAL LIGATION      Assessment & Plan Clinical Impression: 74 y.o female admitted with sudden onset Lt sided weakness and surred speech.  Tpa was administered.  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 pt extubated 6/14, but had increasing lethsrgy > possibly septic due to UTI.  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  PMH includes: hypertensive retinopathy, DM, arthritis, s/p TKA, s/p ORIF Rt hip, s/p ORIF LT hip.  Patient currently requires total +2 with basic self-care skills secondary to muscle weakness, decreased cardiorespiratoy endurance, impaired timing and sequencing, abnormal tone, unbalanced muscle activation, motor apraxia, decreased coordination and decreased motor planning,  decreased visual perceptual skills and decreased visual motor skills, left side neglect, decreased initiation, decreased attention, decreased awareness, decreased problem solving, decreased safety awareness, decreased memory and delayed processing and decreased sitting balance, decreased standing balance, decreased  postural control, hemiplegia and decreased balance strategies.  Prior to hospitalization, patient could complete BADL/IADL with independent .  Patient will benefit from skilled intervention to decrease level of assist with basic self-care skills and increase independence with basic self-care skills prior to discharge home with care partner.  Anticipate patient will require 24 hour supervision and minimal physical assistance and follow up home health.  OT - End of Session Activity Tolerance: Tolerates 10 - 20 min activity with multiple rests Endurance Deficit: Yes OT Assessment Rehab Potential (ACUTE ONLY): Good OT Barriers to Discharge: Incontinence OT Barriers to Discharge Comments: unsure if family will be able to provide the physical A potentially required at DC OT Patient demonstrates impairments in the following area(s): Balance;Edema;Cognition;Endurance;Motor;Pain;Perception;Safety;Sensory;Vision OT Basic ADL's Functional Problem(s): Grooming;Bathing;Dressing;Toileting;Eating OT Transfers Functional Problem(s): Toilet;Tub/Shower OT Additional Impairment(s): Fuctional Use of Upper Extremity OT Plan OT Intensity: Minimum of 1-2 x/day, 45 to 90 minutes OT Frequency: 5 out of 7 days OT Duration/Estimated Length of Stay: 24-28 OT Treatment/Interventions: Balance/vestibular training;Discharge planning;Functional electrical stimulation;Pain management;Self Care/advanced ADL retraining;Therapeutic Activities;UE/LE Coordination activities;Cognitive remediation/compensation;Disease mangement/prevention;Functional mobility training;Patient/family education;Skin care/wound managment;Therapeutic Exercise;Visual/perceptual remediation/compensation;Community reintegration;DME/adaptive equipment instruction;Neuromuscular re-education;Psychosocial support;Splinting/orthotics;UE/LE Strength taining/ROM;Wheelchair propulsion/positioning OT Self Feeding Anticipated Outcome(s): S OT Basic Self-Care Anticipated  Outcome(s): MIN A OT Toileting Anticipated Outcome(s): MIN A OT Bathroom Transfers Anticipated Outcome(s): MIN A OT Recommendation Patient destination: Home Follow Up Recommendations: Home health OT Equipment Recommended: 3 in 1 bedside comode;Tub/shower bench Equipment Details: potentially [facility]   Skilled Therapeutic Intervention 1:1. Pt received in bed with daughter entering room. Pt sleeping soundly requring increased sensory stimuli from room and both OT and daughter calling name/sternal rub to arouse. Pt lethargic throughout. Pt requries total A for all BADLs and +2 for all mobility. Pt and daughter educated on OT role/purpose, CIR, ELOS, POC, CVA recovery, bed positioning, midline orientation, taping/edema management, and tone management. Pt with increased flexor tone in shoulder, elbow and wrist. Pt would benefit from resting hand splint at night and MD alerted. Pt sits EOB with +2 A and requires up to MAX A for sitting balance d/t R pushing. Pt unable to achieve midline unless holding onto rail on far R side. Pt requires 2 attempts for TOTAL A+2 squat pivot transfer to R with pt leaning L and wrapping arm around OT to keep from pushing. Pt unable ot maintain midlin in standard w/c and would benefit from TIS for safety. Returned pt back to bed after A to don shirt at sink with wash UB with A for thoroughness. Pt able ot locate ADL items with MAX multimodal cuing. Maximove transfer back to bed. Exited session with pt seated in bed, exit alarm on and call light in reach  OT Evaluation Precautions/Restrictions  Precautions Precautions: Fall Precaution Comments: L hemi and neglect (getting closer to inattention vs neglect) Restrictions Weight Bearing Restrictions: No General   Vital Signs Therapy Vitals Pulse Rate: 61 BP: (!) 142/87 Patient Position (if appropriate): Lying Oxygen Therapy SpO2: 100 % Pain Pain Assessment Pain Score: 0-No pain Home Living/Prior Functioning Home  Living Available Help at Discharge: Family, Available 24 hours/day Type of Home: House Home Access: Level entry Home Layout: One level Bathroom Shower/Tub: Gaffer, Curtain Bathroom Toilet: Handicapped height Bathroom Accessibility: Yes Additional Comments: has BSC would need, TTB  or shower seat  Lives With: Spouse Prior Function Level of Independence: Independent with basic ADLs, Independent with homemaking with ambulation, Independent with homemaking with wheelchair Vocation Requirements: has a license but didnt drive [place] per daughter report Leisure: Hobbies-yes (Comment) ADL   Vision Baseline Vision/History: Wears glasses Wears Glasses: At all times Vision Assessment?: Vision impaired- to be further tested in functional context;Yes Ocular Range of Motion: Restricted on the left;Impaired-to be further tested in functional context Alignment/Gaze Preference: Gaze right;Head turned Tracking/Visual Pursuits: Decreased smoothness of eye movement to LEFT superior field;Decreased smoothness of eye movement to LEFT inferior field;Impaired - to be further tested in functional context Convergence: Impaired - to be further tested in functional context Perception  Perception: Impaired Inattention/Neglect: Does not attend to left side of body;Does not attend to left visual field Praxis Praxis: Impaired Praxis Impairment Details: Initiation;Motor planning Cognition Overall Cognitive Status: Impaired/Different from baseline Arousal/Alertness: Lethargic Orientation Level: Person Year: 2021 Month: June Day of Week: Incorrect Memory: Impaired Immediate Memory Recall: Sock;Blue;Bed Memory Recall Sock: Not able to recall Memory Recall Blue: Not able to recall Memory Recall Bed: Not able to recall Attention: Sustained Sustained Attention: Impaired Sustained Attention Impairment: Verbal basic;Functional basic Safety/Judgment: Impaired Sensation Sensation Light Touch: Impaired by  gross assessment Light Touch Impaired Details: Absent LUE;Absent LLE Proprioception: Impaired by gross assessment Coordination Gross Motor Movements are Fluid and Coordinated: No Fine Motor Movements are Fluid and Coordinated: No Motor  Motor Motor: Hemiplegia;Abnormal tone Mobility  Bed Mobility Bed Mobility: Rolling Right;Rolling Left Rolling Right: Total Assistance - Patient < 25% Rolling Left: Total Assistance - Patient < 25%  Trunk/Postural Assessment  Cervical Assessment Cervical Assessment:  (Gaze R; able to turn head to midline) Thoracic Assessment Thoracic Assessment:  (rousnded shoulders) Lumbar Assessment Lumbar Assessment:  (post pelvic tilt) Postural Control Postural Control: Deficits on evaluation (absent)  Balance Balance Balance Assessed: Yes Static Sitting Balance Static Sitting - Level of Assistance: 3: Mod assist Dynamic Sitting Balance Sitting balance - Comments: worked extensively on sitting balance EOB; pt unable to correct fully to midline wihtout reaching for bedrail on R; pt recognizes when she is leaning but does not correct posture without cues Extremity/Trunk Assessment RUE Assessment RUE Assessment: Within Functional Limits LUE Assessment LUE Assessment: Exceptions to [facility] General Strength Comments: flexor synergy forming especialy in wrist, elbow and shoulder internally rotated LUE Body System: Neuro Brunstrum levels for arm and hand: Arm;Hand Brunstrum level for arm: Stage II Synergy is developing Brunstrum level for hand: Stage II Synergy is developing     Refer to Care Plan for Long Term Goals  Recommendations for other services: None    Discharge Criteria: Patient will be discharged from OT if patient refuses treatment 3 consecutive times without medical reason, if treatment goals not met, if there is a change in medical status, if patient makes no progress towards goals or if patient is discharged from hospital.  The above  assessment, treatment plan, treatment alternatives and goals were discussed and mutually agreed upon: by patient  Tonny Branch 04/12/2020, 10:20 AM

## 2020-04-13 LAB — GLUCOSE, CAPILLARY
Glucose-Capillary: 144 mg/dL — ABNORMAL HIGH (ref 70–99)
Glucose-Capillary: 148 mg/dL — ABNORMAL HIGH (ref 70–99)
Glucose-Capillary: 185 mg/dL — ABNORMAL HIGH (ref 70–99)
Glucose-Capillary: 238 mg/dL — ABNORMAL HIGH (ref 70–99)

## 2020-04-13 NOTE — IPOC Note (Addendum)
Overall Plan of Care [facility]) Patient Details [name] MRN: [id number] DOB: December 25, 1945  Admitting Diagnosis: Acute ischemic right MCA stroke [facility])  Hospital Problems: Principal Problem:   Acute ischemic right MCA stroke (HCC) s/p tPA & attempted revascularization, embolic stroke, source unknown Active Problems:   Pressure injury of skin   Right middle cerebral artery stroke [facility])     Functional Problem List: Nursing Bladder, Bowel, Safety, Endurance, Motor, Medication Management  PT Balance, Perception, Behavior, Safety, Edema, Sensory, Endurance, Skin Integrity, Motor, Nutrition, Pain  OT Balance, Edema, Cognition, Endurance, Motor, Pain, Perception, Safety, Sensory, Vision  SLP Cognition, Nutrition, Linguistic  TR         Basic ADL's: OT Grooming, Bathing, Dressing, Toileting, Eating     Advanced  ADL's: OT       Transfers: PT Bed Mobility, Bed to Chair, Car, Occupational psychologist, Research scientist (life sciences): PT Ambulation, Psychologist, prison and probation services, Stairs     Additional Impairments: OT Fuctional Use of Upper Extremity  SLP Swallowing, Communication, Social Cognition expression Problem Solving, Memory, Attention, Awareness  TR      Anticipated Outcomes Item Anticipated Outcome  Self Feeding S  Swallowing  min assist   Basic self-care  MIN A  Toileting  MIN A   Bathroom Transfers MIN A  Bowel/Bladder  manage bowel and bladder with mod I assist  Transfers  min assist  Locomotion  TBD if pt will be a functional ambulator  Communication  supervision  Cognition  min assist  Pain  pain level less than 4 on scale of 0-10  Safety/Judgment  remain free of injury, prevent falls with cues and reminders   Therapy Plan: PT Intensity: Minimum of 1-2 x/day ,45 to 90 minutes PT Frequency: 5 out of 7 days PT Duration Estimated Length of Stay: ~4 weeks OT Intensity: Minimum of 1-2 x/day, 45 to 90 minutes OT Frequency: 5 out of 7 days OT  Duration/Estimated Length of Stay: 24-28 SLP Intensity: Minumum of 1-2 x/day, 30 to 90 minutes SLP Frequency: 3 to 5 out of 7 days SLP Duration/Estimated Length of Stay: 21-28 days   Due to the current state of emergency, patients may not be receiving their 3-hours of Medicare-mandated therapy.   Team Interventions: Nursing Interventions Patient/Family Education, Discharge Planning, Bladder Management, Pain Management, Psychosocial Support, Medication Management, Bowel Management  PT interventions Ambulation/gait training, DME/adaptive equipment instruction, Community reintegration, Neuromuscular re-education, Psychosocial support, Stair training, UE/LE Strength taining/ROM, Wheelchair propulsion/positioning, Warden/ranger, Discharge planning, Functional electrical stimulation, Pain management, Skin care/wound management, Therapeutic Activities, UE/LE Coordination activities, Cognitive remediation/compensation, Disease management/prevention, Functional mobility training, Patient/family education, Splinting/orthotics, Therapeutic Exercise, Visual/perceptual remediation/compensation  OT Interventions Balance/vestibular training, Discharge planning, Functional electrical stimulation, Pain management, Self Care/advanced ADL retraining, Therapeutic Activities, UE/LE Coordination activities, Cognitive remediation/compensation, Disease mangement/prevention, Functional mobility training, Patient/family education, Skin care/wound managment, Therapeutic Exercise, Visual/perceptual remediation/compensation, Community reintegration, Fish farm manager, Neuromuscular re-education, Psychosocial support, Splinting/orthotics, UE/LE Strength taining/ROM, Wheelchair propulsion/positioning  SLP Interventions Cognitive remediation/compensation, Financial trader, Environmental controls, Dysphagia/aspiration precaution training, Internal/external aids, Speech/Language facilitation, Patient/family  education  TR Interventions    SW/CM Interventions Discharge Planning, Psychosocial Support, Patient/Family Education   Barriers to Discharge MD  Medical stability and Home enviroment access/loayout  Nursing      PT Inaccessible home environment, Decreased caregiver support, Medical stability, Home environment access/layout, Neurogenic Bowel & Bladder, Lack of/limited family support, Behavior    OT Incontinence unsure if family will be able to provide the physical A potentially  required at DC  SLP Behavior difficult to engage, flat affect, decreased motivation to participate in tasks  SW       Team Discharge Planning: Destination: PT-Home ,OT- Home , SLP-Home Projected Follow-up: PT-Home health PT, 24 hour supervision/assistance, OT-  Home health OT, SLP-Home Health SLP, 24 hour supervision/assistance Projected Equipment Needs: PT-To be determined, OT- 3 in 1 bedside comode, Tub/shower bench, SLP-None recommended by SLP Equipment Details: PT- , OT-potentially DAC Patient/family involved in discharge planning: PT- Patient,  OT-Patient, SLP-Patient, Family member/caregiver  MD ELOS: 21-28d Medical Rehab Prognosis:  Fair Assessment:  74 year old right handed with history of type 2 diabetes mellitus complicated by retinopathy and hypertension.  History taken from chart review and husband due to cognition.  Patient lives with spouse.  1 level home 2 steps to entry.  Patient reportedly ambulating without assistive device prior to admission but sedentary.  She presented on 03/23/2020 with dysarthria and left hemiparesis. CT of the head was negative and TPA was administered.  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  She underwent cerebral angiogram with revascularization per interventional radiology.  She did require short-term intubation.  Hospital course complicated by coffee-ground emesis and low-grade fever worsening of mental status.   Due to concerns for urosepsis she was started on IV meropenem for a short time.  Repeat CT of the head showed progressive right MCA infarct with evidence of reocclusion.  . 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  Bilateral lower extremity Dopplers negative.  Echocardiogram with EF of 70-75% with possible echodensity attached to the posterior mitral valve.  TEE ordered for work-up was negative for vegetation or thrombus and bubble study negative.  EEG completed due to lethargy showed sharp waves in right frontal region and slow generalized lateralization in right hemisphere due to underlying infarct.  No definite seizure identified.  Urology was consulted 04/07/2020 Dr. Annabell Howells for hematuria, felt to be related to anticoagulation with noted urine cultures Klebsiella and placed on Ancep.  A recent renal ultrasound was completed during hospital work-up showed no mass or hydronephrosis.  Patient had been on aspirin and Plavix for CVA prophylaxis Plavix discontinued due to bouts of hematuria.  Patient has been cleared for subcutaneous heparin for DVT prophylaxis.  Hospital course further complicated post stroke dysphagia.  Started on dysphagia #1 honey thick liquids and nasogastric tube removed 04/05/2020.  Therapy evaluations completed and patient was admitted for a comprehensive rehab program. Please see preadmission assessment from earlier today as well.    Now requiring 24/7 Rehab RN,MD, as well as CIR level PT, OT and SLP.  Treatment team will focus on ADLs and mobility with goals set at [facility]   See Team Conference Notes for weekly updates to the plan of care

## 2020-04-13 NOTE — Progress Notes (Signed)
[facility] PHYSICAL MEDICINE & REHABILITATION PROGRESS NOTE   Subjective/Complaints:  No c/o per RN some bleeding from loop site  ROS- neg CP, SOB, N/V/D   Objective:   EP PPM/ICD IMPLANT  Result Date: 04/11/2020 SURGEON:  Loman Brooklyn, MD   PREPROCEDURE DIAGNOSIS:  Cryptogenic Stroke   POSTPROCEDURE DIAGNOSIS:  Cryptogenic Stroke    PROCEDURES:  1. Implantable loop recorder implantation   INTRODUCTION:  Meredith Shaw is a 74 y.o. female with a history of unexplained stroke who presents today for implantable loop implantation.  The patient has had a cryptogenic stroke.  Despite an extensive workup by neurology, no reversible causes have been identified.  she has worn telemetry during which she did not have arrhythmias.  There is significant concern for possible atrial fibrillation as the cause for the patients stroke.  The patient therefore presents today for implantable loop implantation.   DESCRIPTION OF PROCEDURE:  Informed written consent was obtained, and the patient was brought to the electrophysiology lab in a fasting state.  The patient required no sedation for the procedure today.  Mapping over the patient's chest was performed by the EP lab staff to identify the area where electrograms were most prominent for ILR recording.  This area was found to be the left parasternal region over the 3rd-4th intercostal space. The patients left chest was therefore prepped and draped in the usual sterile fashion by the EP lab staff. The skin overlying the left parasternal region was infiltrated with lidocaine for local analgesia.  A 0.5-cm incision was made over the left parasternal region over the 3rd intercostal space.  A subcutaneous ILR pocket was fashioned using a combination of sharp and blunt dissection.  A [employer] Reveal [place] model [id number] implantable loop recorder was then placed into the pocket  R waves were very prominent and measured 0.5mV. EBL<1 ml.  Steri- Strips and a  sterile dressing were then applied.  There were no early apparent complications.   CONCLUSIONS:  1. Successful implantation of a Medtronic Reveal LINQ implantable loop recorder for cryptogenic stroke  2. No early apparent complications.   Recent Labs    04/11/20 0529  WBC 10.4  HGB 10.4*  HCT 32.1*  PLT 348   Recent Labs    04/11/20 0529  NA 138  K 3.5  CL 110  CO2 20*  GLUCOSE 210*  BUN 16  CREATININE 1.18*  CALCIUM 8.6*    Intake/Output Summary (Last 24 hours) at 04/13/2020 0853 Last data filed at 04/13/2020 0841 Gross per 24 hour  Intake 456 ml  Output 1000 ml  Net -544 ml     Physical Exam: Vital Signs Blood pressure (!) 176/90, pulse 62, temperature 97.8 F (36.6 C), temperature source Oral, resp. rate 16, height 5\' 7"  (1.702 m), weight 74.3 kg, SpO2 98 %.   General: No acute distress Mood and affect are appropriate Heart: Regular rate and rhythm no rubs murmurs or extra sounds Lungs: Clear to auscultation, breathing unlabored, no rales or wheezes Abdomen: Positive bowel sounds, soft nontender to palpation, nondistended Extremities: No clubbing, cyanosis, or edema Skin: No evidence of breakdown, no evidence of rash No hematoma around loop  Skin: bleeding from loop site  Neurologic: Cranial nerves II through XII intact, motor strength is 5/5 in right and 0/5 left deltoid, bicep, tricep, grip, hip flexor, knee extensors, ankle dorsiflexor and plantar flexor Sensory exam reduced pinch on left but poor overall awareness of testing  Musculoskeletal: Full range of motion in  all 4 extremities. No joint swelling     Assessment/Plan: 1. 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  which require 3+ hours per day of interdisciplinary therapy in a comprehensive inpatient rehab setting.  Physiatrist is providing close team supervision and 24 hour management of active medical problems listed below.  Physiatrist and rehab team continue to assess barriers to  discharge/monitor patient progress toward functional and medical goals  Care Tool:  Bathing    Body parts bathed by patient: Abdomen, Face   Body parts bathed by helper: Right arm, Left arm, Chest, Front perineal area, Buttocks, Right upper leg, Left upper leg, Right lower leg, Left lower leg     Bathing assist Assist Level: Total Assistance - Patient < 25%     Upper Body Dressing/Undressing Upper body dressing   What is the patient wearing?: Pull over shirt    Upper body assist Assist Level: Dependent - Patient 0%    Lower Body Dressing/Undressing Lower body dressing      What is the patient wearing?: Incontinence brief, Pants     Lower body assist Assist for lower body dressing: 2 Helpers     Toileting Toileting Toileting Activity did not occur (Clothing management and hygiene only): N/A (no void or bm)  Toileting assist Assist for toileting: Maximal Assistance - Patient 25 - 49%     Transfers Chair/bed transfer  Transfers assist  Chair/bed transfer activity did not occur: Safety/medical concerns  Chair/bed transfer assist level: 2 Helpers     Locomotion Ambulation   Ambulation assist   Ambulation activity did not occur: Safety/medical concerns          Walk 10 feet activity   Assist  Walk 10 feet activity did not occur: Safety/medical concerns        Walk 50 feet activity   Assist Walk 50 feet with 2 turns activity did not occur: Safety/medical concerns         Walk 150 feet activity   Assist Walk 150 feet activity did not occur: Safety/medical concerns         Walk 10 feet on uneven surface  activity   Assist Walk 10 feet on uneven surfaces activity did not occur: Safety/medical concerns         Wheelchair     Assist Will patient use wheelchair at discharge?:  (TBD)             Wheelchair 50 feet with 2 turns activity    Assist            Wheelchair 150 feet activity     Assist           Blood pressure (!) 176/90, pulse 62, temperature 97.8 F (36.6 C), temperature source Oral, resp. rate 16, height 5\' 7"  (1.702 m), weight 74.3 kg, SpO2 98 %.  Medical Problem List and Plan: 1.  Left side weakness with dysarthria and dysphagia secondary to right MCA infarction status post TPA and IR with TIC13 revascularization reoccluded on follow-up, infarct embolic secondary to unknown source.  Status post loop recorder             -patientmay shower             -ELOS/Goals: 23-28 days/Min/Mod A             Admit to CIR 2.  Antithrombotics: -DVT/anticoagulation: Subcutaneous heparin             -antiplatelet therapy: Aspirin 81 mg daily 3. Pain Management: Tylenol  as needed             Monitor for headaches with increased activity. 4. Mood: Amantadine 150 mg daily             -antipsychotic agents: N/A 5. Neuropsych: This patient is not capable of making decisions on her own behalf. 6. Skin/Wound Care: Routine skin checks, some bleeding from loop site but no hematoma 7. Fluids/Electrolytes/Nutrition: Routine in and outs. CMP ordered.  8.  Hypertension.  Lopressor 25 mg twice daily, Norvasc 10 mg daily.            Vitals:   04/13/20 0520 04/13/20 0817  BP: (!) 179/93 (!) 176/90  Pulse: 60 62  Resp: 16   Temp: 97.8 F (36.6 C)   SpO2: 98%   permissive for now 9.  Post stroke dysphagia: Dysphagia #1 honey thick liquids.  Patient on IV fluids for hydration follow-up speech therapy             Advance diet as tolerated 10.  Hemorrhagic cystitis complicated by UTI.  Complete course of Ancef.  Follow-up outpatient urology 11.  Diabetes mellitus with peripheral neuropathy.  Hemoglobin A1c 8.9.  Levemir 10 units twice daily.  Check blood sugars before meals and at bedtime CBG (last 3)  Recent Labs    04/12/20 1708 04/12/20 2126 04/13/20 0608  GLUCAP 152* 214* 144*  controlled 7/4              Monitor with increased mobility 12.  Hyperlipidemia.  Zetia/Pravacol 13.   Constipation.  MiraLAX. Adjust bowel meds as necessary    LOS: 2 days A FACE TO FACE EVALUATION WAS PERFORMED  Erick Colace 04/13/2020, 8:53 AM

## 2020-04-14 ENCOUNTER — Inpatient Hospital Stay (HOSPITAL_COMMUNITY): Payer: Medicare Other | Admitting: Physical Therapy

## 2020-04-14 ENCOUNTER — Inpatient Hospital Stay (HOSPITAL_COMMUNITY): Payer: Medicare Other

## 2020-04-14 ENCOUNTER — Inpatient Hospital Stay (HOSPITAL_COMMUNITY): Payer: Medicare Other | Admitting: Occupational Therapy

## 2020-04-14 DIAGNOSIS — I10 Essential (primary) hypertension: Secondary | ICD-10-CM

## 2020-04-14 DIAGNOSIS — I69391 Dysphagia following cerebral infarction: Secondary | ICD-10-CM

## 2020-04-14 LAB — CBC WITH DIFFERENTIAL/PLATELET
Abs Immature Granulocytes: 0.05 10*3/uL (ref 0.00–0.07)
Basophils Absolute: 0.1 10*3/uL (ref 0.0–0.1)
Basophils Relative: 1 %
Eosinophils Absolute: 0.1 10*3/uL (ref 0.0–0.5)
Eosinophils Relative: 1 %
HCT: 35.2 % — ABNORMAL LOW (ref 36.0–46.0)
Hemoglobin: 11.5 g/dL — ABNORMAL LOW (ref 12.0–15.0)
Immature Granulocytes: 1 %
Lymphocytes Relative: 16 %
Lymphs Abs: 1.5 10*3/uL (ref 0.7–4.0)
MCH: 30.3 pg (ref 26.0–34.0)
MCHC: 32.7 g/dL (ref 30.0–36.0)
MCV: 92.6 fL (ref 80.0–100.0)
Monocytes Absolute: 0.6 10*3/uL (ref 0.1–1.0)
Monocytes Relative: 6 %
Neutro Abs: 7.3 10*3/uL (ref 1.7–7.7)
Neutrophils Relative %: 75 %
Platelets: 290 10*3/uL (ref 150–400)
RBC: 3.8 MIL/uL — ABNORMAL LOW (ref 3.87–5.11)
RDW: 14.4 % (ref 11.5–15.5)
WBC: 9.7 10*3/uL (ref 4.0–10.5)
nRBC: 0 % (ref 0.0–0.2)

## 2020-04-14 LAB — GLUCOSE, CAPILLARY
Glucose-Capillary: 182 mg/dL — ABNORMAL HIGH (ref 70–99)
Glucose-Capillary: 222 mg/dL — ABNORMAL HIGH (ref 70–99)
Glucose-Capillary: 179 mg/dL — ABNORMAL HIGH (ref 70–99)
Glucose-Capillary: 236 mg/dL — ABNORMAL HIGH (ref 70–99)

## 2020-04-14 LAB — COMPREHENSIVE METABOLIC PANEL
ALT: 15 U/L (ref 0–44)
AST: 30 U/L (ref 15–41)
Albumin: 2.8 g/dL — ABNORMAL LOW (ref 3.5–5.0)
Alkaline Phosphatase: 83 U/L (ref 38–126)
Anion gap: 11 (ref 5–15)
BUN: 15 mg/dL (ref 8–23)
CO2: 23 mmol/L (ref 22–32)
Calcium: 8.9 mg/dL (ref 8.9–10.3)
Chloride: 106 mmol/L (ref 98–111)
Creatinine, Ser: 1.05 mg/dL — ABNORMAL HIGH (ref 0.44–1.00)
GFR calc Af Amer: >60 mL/min (ref >60)
GFR calc non Af Amer: 53 mL/min — ABNORMAL LOW (ref >60)
Glucose, Bld: 189 mg/dL — ABNORMAL HIGH (ref 70–99)
Potassium: 3.3 mmol/L — ABNORMAL LOW (ref 3.5–5.1)
Sodium: 140 mmol/L (ref 135–145)
Total Bilirubin: 0.4 mg/dL (ref 0.3–1.2)
Total Protein: 6.2 g/dL — ABNORMAL LOW (ref 6.5–8.1)

## 2020-04-14 MED ORDER — INSULIN DETEMIR 100 UNIT/ML ~~LOC~~ SOLN
12.0000 [IU] | Freq: Two times a day (BID) | SUBCUTANEOUS | Status: DC
  Administered 2020-04-14 – 2020-04-18 (×8): 12 [IU] via SUBCUTANEOUS
  Filled 2020-04-14 (×9): qty 0.12

## 2020-04-14 NOTE — Progress Notes (Signed)
Occupational Therapy Session Note  Patient Details  [name] MRN: [id number] Date of Birth: 08/26/1946  Today's Date: 04/14/2020 OT Individual Time: 1345-1429 OT Individual Time Calculation (min): 44 min   Short Term Goals: Week 1:  OT Short Term Goal 1 (Week 1): Pt will sit EOB/EOM for 5 min with MOD A of 1 for sitting balance during functional activity OT Short Term Goal 2 (Week 1): Pt will terminate washing face with no VC OT Short Term Goal 3 (Week 1): Pt will locate 2/2 grooming items on L side of sink wiht min VC OT Short Term Goal 4 (Week 1): Pt will complete 1/4 steps of donning shirt with multimodal cuing OT Short Term Goal 5 (Week 1): Pt will transfer to w/c with MAX A of 2 in prep for [facility] transfer  Skilled Therapeutic Interventions/Progress Updates:    Pt greeted in bed with no s/s pain. OT placed her in chair position to work on trunk control, pt needing Max A to correct Rt LOBs throughout session while sitting upright. Worked on Lt attention and [name] via participation in various grooming tasks. Had pt visually scan to the Lt to find OT and grooming items. She was able to reach for wash cloths, hand sanitizer, and lotion bottle in or near her Lt hand with max cuing and increased time. HOH to incorporate Lt hand functionally, had pt scan to Lt and touch Lt hand with Rt before affected limb involvement. Dtr [name] and spouse were present, OT taught them gentle stretches for the elbow, wrist, and digits to offset hypertonicity. Also educated them to converse with pt on the Lt side to promote Lt visual field scanning/Lt attention. At end of session pt remained in bed with all needs within reach, bed alarm set, and family present.   Therapy Documentation Precautions:  Precautions Precautions: Fall, Other (comment) Precaution Comments: L hemi with hypertonia and L inattention Restrictions Weight Bearing Restrictions: No Pain: Pain Assessment Pain Score: 0-No  pain ADL:       Therapy/Group: Individual Therapy  [name] A Arlon Bleier 04/14/2020, 3:51 PM

## 2020-04-14 NOTE — Progress Notes (Signed)
Pt refused cpap at this time.

## 2020-04-14 NOTE — Progress Notes (Signed)
Meredith Shaw PHYSICAL MEDICINE & REHABILITATION PROGRESS NOTE   Subjective/Complaints:  No complaints. Nurse helping with her breakfast. She's eating all of it.  ROS: limited d/t cognition     Objective:   No results found. Recent Labs    04/14/20 0739  WBC 9.7  HGB 11.5*  HCT 35.2*  PLT 290   Recent Labs    04/14/20 0739  NA 140  K 3.3*  CL 106  CO2 23  GLUCOSE 189*  BUN 15  CREATININE 1.05*  CALCIUM 8.9    Intake/Output Summary (Last 24 hours) at 04/14/2020 1402 Last data filed at 04/14/2020 0010 Gross per 24 hour  Intake 120 ml  Output 900 ml  Net -780 ml     Physical Exam: Vital Signs Blood pressure 112/89, pulse 79, temperature 98 F (36.7 C), resp. rate 18, height 5\' 7"  (1.702 m), weight 73.5 kg, SpO2 99 %.   Constitutional: No distress . Vital signs reviewed. HEENT: EOMI, oral membranes moist Neck: supple Cardiovascular: RRR without murmur. No JVD    Respiratory/Chest: CTA Bilaterally without wheezes or rales. Normal effort    GI/Abdomen: BS +, non-tender, non-distended Ext: no clubbing, cyanosis, or edema Psych: pleasant and cooperative Skin: loop site without bleeding today Neurologic: Left central 7. Decreased oro-motor control. motor strength is 5/5 in right and 0/5 left deltoid, bicep, tricep, grip, hip flexor, knee extensors, ankle dorsiflexor and plantar flexor Sensory exam reduced pinch on left, left inattention.   Musculoskeletal: Full range of motion in all 4 extremities. No joint swelling     Assessment/Plan: 1. 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  which require 3+ hours per day of interdisciplinary therapy in a comprehensive inpatient rehab setting.  Physiatrist is providing close team supervision and 24 hour management of active medical problems listed below.  Physiatrist and rehab team continue to assess barriers to discharge/monitor patient progress toward functional and medical goals  Care Tool:  Bathing     Body parts bathed by patient: Abdomen, Face   Body parts bathed by helper: Right arm, Left arm, Chest, Front perineal area, Buttocks, Right upper leg, Left upper leg, Right lower leg, Left lower leg     Bathing assist Assist Level: Total Assistance - Patient < 25%     Upper Body Dressing/Undressing Upper body dressing   What is the patient wearing?: Hospital gown only    Upper body assist Assist Level: Dependent - Patient 0%    Lower Body Dressing/Undressing Lower body dressing      What is the patient wearing?: Incontinence brief     Lower body assist Assist for lower body dressing: Dependent - Patient 0%     Toileting Toileting Toileting Activity did not occur (Clothing management and hygiene only): N/A (no void or bm)  Toileting assist Assist for toileting: Dependent - Patient 0%     Transfers Chair/bed transfer  Transfers assist  Chair/bed transfer activity did not occur: Safety/medical concerns  Chair/bed transfer assist level: 2 Helpers     Locomotion Ambulation   Ambulation assist   Ambulation activity did not occur: Safety/medical concerns          Walk 10 feet activity   Assist  Walk 10 feet activity did not occur: Safety/medical concerns        Walk 50 feet activity   Assist Walk 50 feet with 2 turns activity did not occur: Safety/medical concerns         Walk 150 feet activity   Assist Walk  150 feet activity did not occur: Safety/medical concerns         Walk 10 feet on uneven surface  activity   Assist Walk 10 feet on uneven surfaces activity did not occur: Safety/medical concerns         Wheelchair     Assist Will patient use wheelchair at discharge?:  (TBD)             Wheelchair 50 feet with 2 turns activity    Assist            Wheelchair 150 feet activity     Assist          Blood pressure 112/89, pulse 79, temperature 98 F (36.7 C), resp. rate 18, height 5\' 7"  (1.702 m),  weight 73.5 kg, SpO2 99 %.  Medical Problem List and Plan: 1.  Left side weakness with dysarthria and dysphagia secondary to right MCA infarction status post TPA and IR with TIC13 revascularization reoccluded on follow-up, infarct embolic secondary to unknown source.  Status post loop recorder             -patientmay shower             -ELOS/Goals: 23-28 days/Min/Mod A             --Continue CIR therapies including PT, OT, and SLP  2.  Antithrombotics: -DVT/anticoagulation: Subcutaneous heparin             -antiplatelet therapy: Aspirin 81 mg daily 3. Pain Management: Tylenol as needed             Monitor for headaches with increased activity. 4. Mood: Amantadine 150 mg daily             -antipsychotic agents: N/A 5. Neuropsych: This patient is not capable of making decisions on her own behalf. 6. Skin/Wound Care: Routine skin checks, some bleeding from loop site but no hematoma 7. Fluids/Electrolytes/Nutrition: Routine in and outs. CMP ordered.  8.  Hypertension.  Lopressor 25 mg twice daily, Norvasc 10 mg daily.           -bp's higher in the early am before meds.   Vitals:   04/14/20 0508 04/14/20 0832  BP: (!) 148/105 112/89  Pulse: 74 79  Resp: 18   Temp: 98 F (36.7 C)   SpO2: 99%    -consider adjusting timing of meds to avoid am elevations--observe for consistent pattern first 9.  Post stroke dysphagia: Dysphagia #1 honey thick liquids.  Patient on IV fluids for hydration follow-up speech therapy             Advance diet as tolerated 10.  Hemorrhagic cystitis complicated by UTI.  Complete course of Ancef.  Follow-up outpatient urology 11.  Diabetes mellitus with peripheral neuropathy.  Hemoglobin A1c 8.9.  Levemir 10 units twice daily.  Check blood sugars before meals and at bedtime CBG (last 3)  Recent Labs    04/13/20 2116 04/14/20 0633 04/14/20 1152  GLUCAP 238* 182* 222*  elevated 7/5, increase levemir to 12u bid              Monitor with increased mobility 12.   Hyperlipidemia.  Zetia/Pravacol 13.  Constipation.  MiraLAX. Adjust bowel meds as necessary    LOS: 3 days A FACE TO FACE EVALUATION WAS PERFORMED  06/15/20 04/14/2020, 2:02 PM

## 2020-04-14 NOTE — Discharge Instructions (Signed)
Inpatient Rehab Discharge Instructions  Meredith Shaw Discharge date and time: No discharge date for patient encounter.   Activities/Precautions/ Functional Status: Activity: activity as tolerated Diet:  Wound Care: none needed Functional status:  ___ No restrictions     ___ Walk up steps independently ___ 24/7 supervision/assistance   ___ Walk up steps with assistance ___ Intermittent supervision/assistance  ___ Bathe/dress independently ___ Walk with walker     _x__ Bathe/dress with assistance ___ Walk Independently    ___ Shower independently ___ Walk with assistance    ___ Shower with assistance ___ No alcohol     ___ Return to work/school ________  Special Instructions: No driving smoking or alcohol STROKE/TIA DISCHARGE INSTRUCTIONS SMOKING Cigarette smoking nearly doubles your risk of having a stroke & is the single most alterable risk factor  If you smoke or have smoked in the last 12 months, you are advised to quit smoking for your health.  Most of the excess cardiovascular risk related to smoking disappears within a year of stopping.  Ask you doctor about anti-smoking medications  [place] Quit Line: 1-800-QUIT NOW  Free Smoking Cessation Classes [phone]  CHOLESTEROL Know your levels; limit fat & cholesterol in your diet  Lipid Panel     Component Value Date/Time   CHOL 156 03/24/2020 0450   CHOL 163 11/15/2019 0859   TRIG 247 (H) 03/27/2020 0707   TRIG 456 (H) 07/03/2013 1504   HDL 27 (L) 03/24/2020 0450   HDL 33 (L) 11/15/2019 0859   HDL 36 (L) 07/03/2013 1504   CHOLHDL 5.8 03/24/2020 0450   VLDL UNABLE TO CALCULATE IF TRIGLYCERIDE OVER 400 mg/dL 57/32/2025 4270   LDLCALC UNABLE TO CALCULATE IF TRIGLYCERIDE OVER 400 mg/dL 62/37/6283 1517   LDLCALC 83 11/15/2019 0859   LDLCALC NOTES: 07/03/2013 1504      Many patients benefit from treatment even if their cholesterol is at goal.  Goal: Total Cholesterol (CHOL) less than 160  Goal:  Triglycerides (TRIG)  less than 150  Goal:  HDL greater than 40  Goal:  LDL (LDLCALC) less than 100   BLOOD PRESSURE American Stroke Association blood pressure target is less that 120/80 mm/Hg  Your discharge blood pressure is:  BP: (!) 148/105  Monitor your blood pressure  Limit your salt and alcohol intake  Many individuals will require more than one medication for high blood pressure  DIABETES (A1c is a blood sugar average for last 3 months) Goal HGBA1c is under 7% (HBGA1c is blood sugar average for last 3 months)  Diabetes:     Lab Results  Component Value Date   HGBA1C 8.9 (H) 03/24/2020     Your HGBA1c can be lowered with medications, healthy diet, and exercise.  Check your blood sugar as directed by your physician  Call your physician if you experience unexplained or low blood sugars.  PHYSICAL ACTIVITY/REHABILITATION Goal is 30 minutes at least 4 days per week  Activity: Increase activity slowly, Therapies: Physical Therapy: Home Health Return to work:   Activity decreases your risk of heart attack and stroke and makes your heart stronger.  It helps control your weight and blood pressure; helps you relax and can improve your mood.  Participate in a regular exercise program.  Talk with your doctor about the best form of exercise for you (dancing, walking, swimming, cycling).  DIET/WEIGHT Goal is to maintain a healthy weight  Your discharge diet is:  Diet Order  DIET - DYS 1 Room service appropriate? Yes with Assist; Fluid consistency: Honey Thick  Diet effective now                 liquids Your height is:  Height: 5\' 7"  (170.2 cm) Your current weight is: Weight: 73.5 kg Your Body Mass Index (BMI) is:  BMI (Calculated): 25.37  Following the type of diet specifically designed for you will help prevent another stroke.  Your goal weight range is:    Your goal Body Mass Index (BMI) is 19-24.  Healthy food habits can help reduce 3 risk factors for stroke:  High  cholesterol, hypertension, and excess weight.  RESOURCES Stroke/Support Group:  Call [phone]   STROKE EDUCATION PROVIDED/REVIEWED AND GIVEN TO PATIENT Stroke warning signs and symptoms How to activate emergency medical system (call 911). Medications prescribed at discharge. Need for follow-up after discharge. Personal risk factors for stroke. Pneumonia vaccine given:  Flu vaccine given:  My questions have been answered, the writing is legible, and I understand these instructions.  I will adhere to these goals & educational materials that have been provided to me after my discharge from the hospital.      My questions have been answered and I understand these instructions. I will adhere to these goals and the provided educational materials after my discharge from the hospital.  Patient/Caregiver Signature _______________________________ Date __________  Clinician Signature _______________________________________ Date __________  Please bring this form and your medication list with you to all your follow-up doctor's appointments.

## 2020-04-14 NOTE — Progress Notes (Signed)
Speech Language Pathology Daily Session Note  Patient Details  [name] MRN: [id number] Date of Birth: [birth year]  Today's Date: 04/14/2020 SLP Individual Time: 1105-1201 SLP Individual Time Calculation (min): 56 min  Short Term Goals: Week 1: SLP Short Term Goal 1 (Week 1): Pt will consume dys 1 textures and honey thick liquids with mod assist for use of swallowing strategies to mitigate left anterior spillage of boluses. SLP Short Term Goal 2 (Week 1): Pt will consume therapeutic trials of dys 2 solids with mod assist cues for use of swallowing strategies to clear boluses from the oral cavity. SLP Short Term Goal 3 (Week 1): Pt will consume therapeutic trials of thin liquids over 3 consecutive sessions with out respiratory decompensation prior to repeat MBS. SLP Short Term Goal 4 (Week 1): Pt sill sustain her attention to basic familiar tasks for 3 minute intervals with mod assist multimodal cues for redirection. SLP Short Term Goal 5 (Week 1): Pt will complete basic, familiar tasks with max assist  multimodal cues for functional problem solving. SLP Short Term Goal 6 (Week 1): Pt will locate items to the left of midline during basic, familiar tasks with max assist multimodal cues.  Skilled Therapeutic Interventions: Skilled ST services focused on swallow and cognitive skills. SLP ordered early lunch tray, however it did not arrive at the beginning of the treatment session and SLP obtained from the kitchen. SLP repositioned pt in bed for PO consumption of dys 1 textures and honey thick liquids via straw. Pt demonstrated initermittent oral holding and max A verbal cues for sustained attention (cuing at 1 -2 minute interval) and for initiation for continued PO consumption. Pt was abel to locate items on tray at midline with max A verbal and visual anchors but not able to scan left of midline. Pt was left in room with call bell within reach and bed alarm set. ST recommends to continue  skilled ST services.      Pain Pain Assessment Pain Scale: 0-10 Pain Score: 0-No pain Pain Type: Acute pain Pain Location: Leg Pain Orientation: Right;Left Pain Descriptors / Indicators: Aching;Discomfort Pain Onset: Gradual Patients Stated Pain Goal: 0 Pain Intervention(s): Medication (See eMAR);Repositioned  Therapy/Group: Individual Therapy  Yahir Tavano 04/14/2020, 12:12 PM

## 2020-04-14 NOTE — Progress Notes (Signed)
Physical Therapy Session Note  Patient Details  [name] MRN: [id number] Date of Birth: 04/22/1946  Today's Date: 04/14/2020 PT Individual Time: 0900-0955 and 1445-1525 PT Individual Time Calculation (min): 55 min abd 40 min  Short Term Goals: Week 1:  PT Short Term Goal 1 (Week 1): Pt will perform supine<>sit with max assist of 1 PT Short Term Goal 2 (Week 1): Pt will demonstrate ability to maintain static sitting EOB with close supervision for 5 minutes PT Short Term Goal 3 (Week 1): Pt will participate in sitting balance outcome measure PT Short Term Goal 4 (Week 1): Pt will perform bed<>chair transfers with +2 max assist  Skilled Therapeutic Interventions/Progress Updates: Pt presented in bed with nsg present eating breakfast and agreeable to therapy. PTA took over for feeding assist to pt. Pt was able to continue eating solids and PTA provided spoonfuls of honey thick liquids to pt audibly swallowing and no spillage from mouth. Due to no +2 available all activities performed bed level this session for pt and therapist safety. Pt participated in blocked practice rolling L maxA and max cues for sequencing with increased time and total A for rolling to R. When rolling to L PTA noted that pt had increased redness at R heel with nsg notified and foam dressing applied. Pt also participated in scanning activities with PTA obtaining yellow cups and cueing pt to turn head to L to scan and reach with RUE to pick up and stack cups. Pt able to perform with max multimodal cues and limited distractions. Pt repositioned to comfort and left with RLE propped under pillow with heel floating, PRAFO on LLE with kickstand out to maintain neutral hip. Pt left with call bell within reach bed alarm on, and needs met.  Tx2: Pt presented in bed with husband and dgt [name] present. Pt agreeable to therapy. Session focused on family ed for positioning and ROM as well as bed mobility. PTA had pt scan to L to  locate dgt then PTA provided max A for pt to roll to L attempting to keep eyes on dgt. Pt was able to grab bed rail and hold on while PTA adjusted hips. PTA noted that pt was incontinent of bowels and pt was able to follow commands to lift leg and position as PTA performed peri-care. Once completed pt indicated felt that bladder was full, placed on bedpan for attempted void however pt unable to void, nsg notified and pt to be cathed after session. Once bedpan removed pt performed rolling to R with total A for completion of donning brief. Pt required total A x 2 for boost to HOB. Once completed nsg arrived for in/out cath, pt left in bed with nsg present and current needs met.          Therapy Documentation Precautions:  Precautions Precautions: Fall, Other (comment) Precaution Comments: L hemi with hypertonia and L inattention Restrictions Weight Bearing Restrictions: No General:   Vital Signs:   Pain: Pain Assessment Pain Score: 0-No pain   Therapy/Group: Individual Therapy      , PTA  04/14/2020, 12:52 PM  

## 2020-04-14 NOTE — Progress Notes (Signed)
RT offered pt CPAP for the night and pt declined. RT will continue to monitor.l

## 2020-04-14 NOTE — Plan of Care (Signed)
  Problem: Consults Goal: RH STROKE PATIENT EDUCATION Description: See Patient Education module for education specifics  Outcome: Progressing Goal: Diabetes Guidelines if Diabetic/Glucose > 140 Description: If diabetic or lab glucose is > 140 mg/dl - Initiate Diabetes/Hyperglycemia Guidelines & Document Interventions  Outcome: Progressing   Problem: RH BOWEL ELIMINATION Goal: RH STG MANAGE BOWEL WITH ASSISTANCE Description: STG Manage Bowel with min assist Outcome: Progressing   Problem: RH BLADDER ELIMINATION Goal: RH STG MANAGE BLADDER WITH ASSISTANCE Description: STG Manage Bladder With min assist Outcome: Progressing   Problem: RH SKIN INTEGRITY Goal: RH STG MAINTAIN SKIN INTEGRITY WITH ASSISTANCE Description: STG Maintain Skin Integrity With mod I assist Outcome: Progressing   Problem: RH SAFETY Goal: RH STG ADHERE TO SAFETY PRECAUTIONS W/ASSISTANCE/DEVICE Description: STG Adhere to Safety Precautions With cues and reminders Outcome: Progressing   Problem: RH COGNITION-NURSING Goal: RH STG ANTICIPATES NEEDS/CALLS FOR ASSIST W/ASSIST/CUES Description: STG Anticipates Needs/Calls for Assist With cues and reminders Outcome: Progressing   Problem: RH PAIN MANAGEMENT Goal: RH STG PAIN MANAGED AT OR BELOW PT'S PAIN GOAL Description: Pain level less than 4 on scale of 0-10 Outcome: Progressing   Problem: RH KNOWLEDGE DEFICIT Goal: RH STG INCREASE KNOWLEDGE OF DIABETES Description: Pt will be able to adhere to medication regimen and dietary modification to better control blood glucose levels with min assist.  Outcome: Progressing Goal: RH STG INCREASE KNOWLEDGE OF HYPERTENSION Description: Pt will be able to adhere to medication regimen and dietary modification to better control blood pressure with min assist using handouts and booklets provided Outcome: Progressing Goal: RH STG INCREASE KNOWLEGDE OF HYPERLIPIDEMIA Description: Pt will be able to adhere to medication  regimen and dietary modification to better control blood cholesterol levels with min assist using booklets and handouts provided.  Outcome: Progressing Goal: RH STG INCREASE KNOWLEDGE OF STROKE PROPHYLAXIS Description: Pt will be able to adhere to medication regimen and dietary modification to better control blood pressure and prevent stroke with min assist using handouts and booklets provided.  Outcome: Progressing   

## 2020-04-14 NOTE — Progress Notes (Signed)
Inpatient Rehabilitation  Patient information reviewed and entered into eRehab system by Natalee Tomkiewicz M. Summerlynn Glauser, M.A., CCC/SLP, PPS Coordinator.  Information including medical coding, functional ability and quality indicators will be reviewed and updated through discharge.    

## 2020-04-15 ENCOUNTER — Inpatient Hospital Stay (HOSPITAL_COMMUNITY): Payer: Medicare Other | Admitting: Physical Therapy

## 2020-04-15 ENCOUNTER — Inpatient Hospital Stay (HOSPITAL_COMMUNITY): Payer: Medicare Other

## 2020-04-15 ENCOUNTER — Encounter (HOSPITAL_COMMUNITY): Payer: Self-pay | Admitting: Cardiology

## 2020-04-15 ENCOUNTER — Inpatient Hospital Stay (HOSPITAL_COMMUNITY): Payer: Medicare Other | Admitting: Occupational Therapy

## 2020-04-15 ENCOUNTER — Inpatient Hospital Stay (HOSPITAL_COMMUNITY): Payer: Medicare Other | Admitting: Speech Pathology

## 2020-04-15 LAB — GLUCOSE, CAPILLARY
Glucose-Capillary: 171 mg/dL — ABNORMAL HIGH (ref 70–99)
Glucose-Capillary: 221 mg/dL — ABNORMAL HIGH (ref 70–99)
Glucose-Capillary: 227 mg/dL — ABNORMAL HIGH (ref 70–99)
Glucose-Capillary: 224 mg/dL — ABNORMAL HIGH (ref 70–99)

## 2020-04-15 MED ORDER — POTASSIUM CHLORIDE CRYS ER 10 MEQ PO TBCR
10.0000 meq | EXTENDED_RELEASE_TABLET | Freq: Every day | ORAL | Status: DC
  Administered 2020-04-15 – 2020-05-05 (×19): 10 meq via ORAL
  Filled 2020-04-15 (×21): qty 1

## 2020-04-15 MED ORDER — SENNOSIDES-DOCUSATE SODIUM 8.6-50 MG PO TABS
1.0000 | ORAL_TABLET | Freq: Two times a day (BID) | ORAL | Status: DC
  Administered 2020-04-15 – 2020-04-30 (×30): 1 via ORAL
  Filled 2020-04-15 (×30): qty 1

## 2020-04-15 NOTE — Progress Notes (Signed)
Physical Therapy Session Note  Patient Details  [name] MRN: [id number] Date of Birth: 07-Sep-1946  Today's Date: 04/15/2020 PT Individual Time: [phone] and 1445-1515 PT Individual Time Calculation (min): 45 min and  30 min   Short Term Goals: Week 1:  PT Short Term Goal 1 (Week 1): Pt will perform supine<>sit with max assist of 1 PT Short Term Goal 2 (Week 1): Pt will demonstrate ability to maintain static sitting EOB with close supervision for 5 minutes PT Short Term Goal 3 (Week 1): Pt will participate in sitting balance outcome measure PT Short Term Goal 4 (Week 1): Pt will perform bed<>chair transfers with +2 max assist  Skilled Therapeutic Interventions/Progress Updates: Pt presented in bed agreeable to therapy. Pt performed rolling L/R with maxA with max multimodal cues for scanning to L before turning. Pants donned total A with rolls. Performed supine to sit at EOB with maxA x2 and significant posterior lean upon sitting with pt requiring facilitation to come to midline. Nsg arrived to perform bladder scan and cath thus pt returned to supine. Once PTA returned to room (15 min break) performed bed mobility in same manner as prior. Performed STS max/total A x 2 in [place] to transfer to [place]. Pt repositioned and transported to day room. Engaged pt in looking out window for L visual scanning and required mod/max multimodal cues to rotate to look out window. Pt transported back to room at end of session and remained in TIS with belt alarm on, call bell within reach and current needs met.   Tx2: Pt presented in bed agreeable to therapy. Session focused on bed mobility and sitting balance. Performed supine to sit with maxA x2 due to posterior lean. PTA provided max multimodal cues for increasing anterior lean and to scan to midline. Pt requiring mod/maxA for sitting balance due to posterior lean. Pt was able to lean anteriorly however unable to maintain. Pt participated in reaching  activities with emphasis on R lateral lean. Pt noted to have some pushing tendencies when achieving neutral position. Pt returned to supine total A x 2 and repositioned to comfort. Pt left with wrist splint on, PRAFO with kickstand positioned to maintain neutral hip, bed alarm on, and current needs met.       Therapy Documentation Precautions:  Precautions Precautions: Fall, Other (comment) Precaution Comments: L hemi with hypertonia and L inattention Restrictions Weight Bearing Restrictions: No General: PT Amount of Missed Time (min): 15 Minutes PT Missed Treatment Reason: Toileting Vital Signs: Therapy Vitals Temp: (!) 97.5 F (36.4 C) Pulse Rate: 64 Resp: 18 BP: 133/73 Patient Position (if appropriate): Lying Oxygen Therapy SpO2: 100 % O2 Device: Room Air      Therapy/Group: Individual Therapy  Santi Troung  Ylonda Storr, PTA  04/15/2020, 4:34 PM

## 2020-04-15 NOTE — Progress Notes (Signed)
Speech Language Pathology Daily Session Note  Patient Details  [name] MRN: [id number] Date of Birth: 07-Jun-1946  Today's Date: 04/15/2020 SLP Individual Time: 1130-1155 SLP Individual Time Calculation (min): 25 min  Short Term Goals: Week 1: SLP Short Term Goal 1 (Week 1): Pt will consume dys 1 textures and honey thick liquids with mod assist for use of swallowing strategies to mitigate left anterior spillage of boluses. SLP Short Term Goal 2 (Week 1): Pt will consume therapeutic trials of dys 2 solids with mod assist cues for use of swallowing strategies to clear boluses from the oral cavity. SLP Short Term Goal 3 (Week 1): Pt will consume therapeutic trials of thin liquids over 3 consecutive sessions with out respiratory decompensation prior to repeat MBS. SLP Short Term Goal 4 (Week 1): Pt sill sustain her attention to basic familiar tasks for 3 minute intervals with mod assist multimodal cues for redirection. SLP Short Term Goal 5 (Week 1): Pt will complete basic, familiar tasks with max assist  multimodal cues for functional problem solving. SLP Short Term Goal 6 (Week 1): Pt will locate items to the left of midline during basic, familiar tasks with max assist multimodal cues.  Skilled Therapeutic Interventions: Skilled treatment session focused on cognitive goals. Upon arrival, patient was asleep while upright in the tilt-in-space wheelchair and required extra time and Min verbal and tactile cues for arousal. SLP wanted to attempt trials of upgraded textures, however, unable to locate patient's dentures. SLP attempted to call the patient's husband to request he bring them, but he was unable to be reached. Patient consumed honey-thick liquids via tsp with mildly prolonged AP transit but without overt s/s of aspiration. Patient was oriented to place and time but required Min A verbal cues for orientation to situation.  Mod-Max A verbal and visual cues were also needed to locate  items at midline. Patient with intermittent language of confusion but was easily redirected. Patient left upright in the tilt-in-space wheelchair with alarm on and all needs within reach. Continue with current plan of care.      Pain No/Denies Pain  Therapy/Group: Individual Therapy  Kasem Mozer 04/15/2020, 12:06 PM

## 2020-04-15 NOTE — Progress Notes (Signed)
[place] PHYSICAL MEDICINE & REHABILITATION PROGRESS NOTE   Subjective/Complaints:   No issues overnite , pt oriented to hospital and CVA, thought I was her therapist   ROS: limited d/t cognition     Objective:   No results found. Recent Labs    04/14/20 0739  WBC 9.7  HGB 11.5*  HCT 35.2*  PLT 290   Recent Labs    04/14/20 0739  NA 140  K 3.3*  CL 106  CO2 23  GLUCOSE 189*  BUN 15  CREATININE 1.05*  CALCIUM 8.9    Intake/Output Summary (Last 24 hours) at 04/15/2020 0818 Last data filed at 04/14/2020 2245 Gross per 24 hour  Intake 236 ml  Output 1000 ml  Net -764 ml     Physical Exam: Vital Signs Blood pressure 130/65, pulse 60, temperature 98 F (36.7 C), temperature source Oral, resp. rate 16, height 5\' 7"  (1.702 m), weight 74 kg, SpO2 100 %.    General: No acute distress Mood and affect are appropriate Heart: Regular rate and rhythm no rubs murmurs or extra sounds Lungs: Clear to auscultation, breathing unlabored, no rales or wheezes Abdomen: Positive bowel sounds, soft nontender to palpation, nondistended Extremities: No clubbing, cyanosis, or edema Skin: No evidence of breakdown, no evidence of rash  Musculoskeletal: Full range of motion in all 4 extremities. No joint swelling  Skin: loop site without bleeding today Neurologic: Left central 7. Decreased oro-motor control. motor strength is 5/5 in right and 0/5 left deltoid, bicep, tricep, grip, hip flexor, knee extensors, ankle dorsiflexor and plantar flexor Sensory exam reduced pinch on left, left inattention.   Musculoskeletal: Full range of motion in all 4 extremities. No joint swelling     Assessment/Plan: 1. 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  which require 3+ hours per day of interdisciplinary therapy in a comprehensive inpatient rehab setting.  Physiatrist is providing close team supervision and 24 hour management of active medical problems listed below.  Physiatrist  and rehab team continue to assess barriers to discharge/monitor patient progress toward functional and medical goals  Care Tool:  Bathing    Body parts bathed by patient: Abdomen, Face   Body parts bathed by helper: Right arm, Left arm, Chest, Front perineal area, Buttocks, Right upper leg, Left upper leg, Right lower leg, Left lower leg     Bathing assist Assist Level: Total Assistance - Patient < 25%     Upper Body Dressing/Undressing Upper body dressing   What is the patient wearing?: Hospital gown only    Upper body assist Assist Level: Dependent - Patient 0%    Lower Body Dressing/Undressing Lower body dressing      What is the patient wearing?: Incontinence brief     Lower body assist Assist for lower body dressing: Dependent - Patient 0%     Toileting Toileting Toileting Activity did not occur (Clothing management and hygiene only): N/A (no void or bm)  Toileting assist Assist for toileting: Dependent - Patient 0%     Transfers Chair/bed transfer  Transfers assist  Chair/bed transfer activity did not occur: Safety/medical concerns  Chair/bed transfer assist level: 2 Helpers     Locomotion Ambulation   Ambulation assist   Ambulation activity did not occur: Safety/medical concerns          Walk 10 feet activity   Assist  Walk 10 feet activity did not occur: Safety/medical concerns        Walk 50 feet activity   Assist Walk  50 feet with 2 turns activity did not occur: Safety/medical concerns         Walk 150 feet activity   Assist Walk 150 feet activity did not occur: Safety/medical concerns         Walk 10 feet on uneven surface  activity   Assist Walk 10 feet on uneven surfaces activity did not occur: Safety/medical concerns         Wheelchair     Assist Will patient use wheelchair at discharge?:  (TBD)             Wheelchair 50 feet with 2 turns activity    Assist            Wheelchair 150  feet activity     Assist          Blood pressure 130/65, pulse 60, temperature 98 F (36.7 C), temperature source Oral, resp. rate 16, height 5\' 7"  (1.702 m), weight 74 kg, SpO2 100 %.  Medical Problem List and Plan: 1.  Left side weakness with dysarthria and dysphagia secondary to right MCA infarction status post TPA and IR with TIC13 revascularization reoccluded on follow-up, infarct embolic secondary to unknown source.  Status post loop recorder             -patientmay shower             -ELOS/Goals: 23-28 days/Min/Mod A             --Continue CIR therapies including PT, OT, and SLP - team conf in am  2.  Antithrombotics: -DVT/anticoagulation: Subcutaneous heparin             -antiplatelet therapy: Aspirin 81 mg daily 3. Pain Management: Tylenol as needed             Monitor for headaches with increased activity. 4. Mood: Amantadine 150 mg daily             -antipsychotic agents: N/A 5. Neuropsych: This patient is not capable of making decisions on her own behalf. 6. Skin/Wound Care: Routine skin checks, some bleeding from loop site but no hematoma 7. Fluids/Electrolytes/Nutrition: Routine in and outs. CMP ordered.  HypoK, will supplement  8.  Hypertension.  Lopressor 25 mg twice daily, Norvasc 10 mg daily.           -bp's higher in the early am before meds.   Vitals:   04/14/20 1918 04/15/20 0406  BP: 124/67 130/65  Pulse: 75 60  Resp: 17 16  Temp: 97.7 F (36.5 C) 98 F (36.7 C)  SpO2: 97% 100%   Controlled 7/6 9.  Post stroke dysphagia: Dysphagia #1 honey thick liquids.  Patient on IV fluids for hydration follow-up speech therapy             Advance diet as tolerated 10.  Hemorrhagic cystitis complicated by UTI.  Complete course of Ancef.  Follow-up outpatient urology 11.  Diabetes mellitus with peripheral neuropathy.  Hemoglobin A1c 8.9.  Levemir 10 units twice daily.  Check blood sugars before meals and at bedtime CBG (last 3)  Recent Labs    04/14/20 1622  04/14/20 2056 04/15/20 0630  GLUCAP 179* 236* 171*  elevated 7/5, increase levemir to 12u bid, monitor effect               Monitor with increased mobility 12.  Hyperlipidemia.  Zetia/Pravacol 13.  Constipation.  MiraLAX. Adjust bowel meds as necessary    LOS: 4 days A FACE TO FACE EVALUATION WAS  PERFORMED  Erick Colace 04/15/2020, 8:18 AM

## 2020-04-15 NOTE — Progress Notes (Signed)
Inpatient Rehabilitation Center Individual Statement of Services  Patient Name:  Meredith Shaw  Date:  04/15/2020  Welcome to the Inpatient Rehabilitation Center.  Our goal is to provide you with an individualized program based on your diagnosis and situation, designed to meet your specific needs.  With this comprehensive rehabilitation program, you will be expected to participate in at least 3 hours of rehabilitation therapies Monday-Friday, with modified therapy programming on the weekends.  Your rehabilitation program will include the following services:  Physical Therapy (PT), Occupational Therapy (OT), Speech Therapy (ST), 24 hour per day rehabilitation nursing, Therapeutic Recreaction (TR), Neuropsychology, Care Coordinator, Rehabilitation Medicine, Nutrition Services, Pharmacy Services and Other  Weekly team conferences will be held on Wednesdays to discuss your progress.  Your Inpatient Rehabilitation Care Coordinator will talk with you frequently to get your input and to update you on team discussions.  Team conferences with you and your family in attendance may also be held.  Expected length of stay: 17-20 Days  Overall anticipated outcome: Min A to Supervison  Depending on your progress and recovery, your program may change. Your Inpatient Rehabilitation Care Coordinator will coordinate services and will keep you informed of any changes. Your Inpatient Rehabilitation Care Coordinator's name and contact numbers are listed  below.  The following services may also be recommended but are not provided by the Inpatient Rehabilitation Center:    Home Health Rehabiltiation Services  Outpatient Rehabilitation Services    Arrangements will be made to provide these services after discharge if needed.  Arrangements include referral to agencies that provide these services.  Your insurance has been verified to be:  [facility] Your primary doctor is:  Daphine Deutscher, [place]  Pertinent information  will be shared with your doctor and your insurance company.  Inpatient Rehabilitation Care Coordinator:  Lavera Guise, [place] [phone] or [phone]  Information discussed with and copy given to patient by: [name], 04/15/2020, 10:54 AM

## 2020-04-15 NOTE — Progress Notes (Signed)
Occupational Therapy Session Note  Patient Details  [name] MRN: [id number] Date of Birth: 1945-11-21  Today's Date: 04/15/2020 OT Individual Time: 1300-1355 OT Individual Time Calculation (min): 55 min    Short Term Goals: Week 1:  OT Short Term Goal 1 (Week 1): Pt will sit EOB/EOM for 5 min with MOD A of 1 for sitting balance during functional activity OT Short Term Goal 2 (Week 1): Pt will terminate washing face with no VC OT Short Term Goal 3 (Week 1): Pt will locate 2/2 grooming items on L side of sink wiht min VC OT Short Term Goal 4 (Week 1): Pt will complete 1/4 steps of donning shirt with multimodal cuing OT Short Term Goal 5 (Week 1): Pt will transfer to w/c with MAX A of 2 in prep for [facility] transfer  Skilled Therapeutic Interventions/Progress Updates:    Upon entering the room, pt seated in tilt in space wheelchair with RN present in the room and pt reporting neck pain. She has been up in chair for ~4 hours at this time and appears very fatigued. Pt needing max cuing to visually scan to midline during this session. Chair positioned and 6 inch step placed under pt's feet secondary to feet not touching floor. Slide board placed and dependent transfer with bobath technique across the board. Pt was initially pushing with R UE until it was blocked into lap. Second person to hold equipment in place. Pt given max cuing to brace self into R lateral lean for sit >supine and needing manual facilitation to place elbow onto bed. +2 assistance for safety and to reposition in bed. Pt rolling L <> R with max - total A and therapist checking brief which was dry. OT providing stretching to L UE and pt needing max cuing to attempt to locate  UE with stretching. OT placed L hand into resting hand splint and positioned for safety. Pt appears to be having spasms on R UE and LE as well but is unable to answer questions about this when asked. Pt remained in bed with call bell and all needed items  within reach.  Therapy Documentation Precautions:  Precautions Precautions: Fall, Other (comment) Precaution Comments: L hemi with hypertonia and L inattention Restrictions Weight Bearing Restrictions: No   Therapy/Group: Individual Therapy  Alen Bleacher 04/15/2020, 1:53 PM

## 2020-04-15 NOTE — Care Management (Signed)
Patient ID: Meredith Shaw, female   DOB: 16-Mar-1946, 74 y.o.   MRN: [id number]   Met with the patient to review role of the CM in collaboration with SW Margreta Journey) to facilitate preparation for discharge. Patient c/o neck pain and is fatigued after therapy. Noted risk factors of FM, HTN and dyslipidemia, on ASA. Currently incontinent of bowel and requiring intermittent caths q 6 hours for retention. Reviewed continued education on secondary stroke prevention with patient and family. Handouts and other educational materials already given to family.

## 2020-04-15 NOTE — Progress Notes (Signed)
Inpatient Rehabilitation Care Coordinator Assessment and Plan  Patient Details  [name] MRN: [id number] Date of Birth: 12/09/1945  Today's Date: 04/15/2020  Problem List:  Patient Active Problem List   Diagnosis Date Noted  . Dysphagia due to recent cerebral infarction 04/11/2020  . 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 Slow transit constipation   . Dyslipidemia   . Hemorrhagic cystitis   . Benign essential HTN   . UTI (urinary tract infection), bacterial   . Urinary tract infection with hematuria   . AKI (acute kidney injury) (HCC)   . Acute ischemic right MCA stroke [facility]) s/p tPA & attempted revascularization, embolic stroke, source unknown 03/24/2020  . 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 Acquired hypothyroidism 08/15/2019  . Hyperlipidemia associated with type 2 diabetes mellitus (HCC) 05/14/2019  . Diabetic retinopathy (HCC) 11/09/2018  . Closed fracture of femur, intertrochanteric, right, initial encounter (HCC) 08/19/2018  . Type 2 diabetes mellitus with complication, without long-term current use of insulin (HCC) 02/15/2018  . Closed left hip fracture (HCC) 02/10/2018  . CKD stage 3 due to type 2 diabetes mellitus (HCC) 02/10/2018  . Hip fracture (HCC) 02/10/2018  . Pressure injury of skin 02/10/2018  . Arthritis of left knee 04/09/2014  . Status post total knee replacement 04/09/2014  . Arthropathy   . Hypertension associated with type 2 diabetes mellitus (HCC)   . Kidney stones   . Postablative hypothyroidism   . Degenerative arthritis of knee 06/09/2012  . Flatulence, eructation, and gas pain 12/24/2005  . Abdominal or pelvic swelling, mass, or lump, other specified site 12/24/2005  . Age-related osteoporosis with current pathological fracture with routine healing 12/04/2004  . Symptomatic menopausal or female climacteric states 12/04/2004  . Arthropathy, lower leg 04/21/2004  . Hematuria 09/17/2003  . Other long  term (current) drug therapy 07/05/2002   Past Medical History:  Past Medical History:  Diagnosis Date  . Arthritis    OA AND PAIN BOTH KNEES -RIGHT KNEE HURTS WORSE THAN LEFT  . Cataract   . Diabetes mellitus    ORAL MEDICATION - NO INSULIN  . Hypertension   . Hypertensive retinopathy    OU  . Hypothyroidism   . Kidney stones    NONE AT PRESENT TIME THAT PT IS AWARE OF  . Osteoporosis    Past Surgical History:  Past Surgical History:  Procedure Laterality Date  . ABDOMINAL HYSTERECTOMY    . BUBBLE STUDY  03/26/2020   Procedure: BUBBLE STUDY;  Surgeon: Lars Masson, MD;  Location: [facility] ENDOSCOPY;  Service: Cardiovascular;;  . CATARACT EXTRACTION    . CHOLECYSTECTOMY  1972  . COLONOSCOPY    . EYE SURGERY     RIGHT CATARACT EXTRACTON WITH LENS IMPLANT  . GROWTH REMOVED FROM THYROID    . INTRAMEDULLARY (IM) NAIL INTERTROCHANTERIC Left 02/10/2018   Procedure: INTRAMEDULLARY (IM) NAIL INTERTROCHANTRIC;  Surgeon: Kathryne Hitch, MD;  Location: [facility] OR;  Service: Orthopedics;  Laterality: Left;  . INTRAMEDULLARY (IM) NAIL INTERTROCHANTERIC Right 08/20/2018   Procedure: ORIF RIGHT HIP INTERTROC FRACTURE;  Surgeon: Kathryne Hitch, MD;  Location: [facility] OR;  Service: Orthopedics;  Laterality: Right;  . IR CT HEAD LTD  03/24/2020  . IR PERCUTANEOUS ART THROMBECTOMY/INFUSION INTRACRANIAL INC DIAG ANGIO  03/24/2020  . left hip surgery   02/2018  . LOOP RECORDER INSERTION N/A 04/11/2020   Procedure: LOOP RECORDER INSERTION;  Surgeon: Regan Lemming, MD;  Location: [facility] INVASIVE CV LAB;  Service:  Cardiovascular;  Laterality: N/A;  . ORIF HIP FRACTURE Right 08/20/2018  . PERCUTANEOUS NEPHROLITHOTOMY    . RADIOLOGY WITH ANESTHESIA N/A 03/23/2020   Procedure: IR WITH ANESTHESIA;  Surgeon: Julieanne Cotton, MD;  Location: [facility] OR;  Service: Radiology;  Laterality: N/A;  . TEE WITHOUT CARDIOVERSION N/A 03/26/2020   Procedure: TRANSESOPHAGEAL ECHOCARDIOGRAM (TEE);  Surgeon: Lars Masson, MD;  Location: [facility] ENDOSCOPY;  Service: Cardiovascular;  Laterality: N/A;  . TOTAL KNEE ARTHROPLASTY  06/09/2012   Procedure: TOTAL KNEE ARTHROPLASTY;  Surgeon: Kathryne Hitch, MD;  Location: [facility] ORS;  Service: Orthopedics;  Laterality: Right;  Right Total Knee Arthroplasty  . TOTAL KNEE ARTHROPLASTY Left 04/09/2014   Procedure: LEFT TOTAL KNEE ARTHROPLASTY;  Surgeon: Kathryne Hitch, MD;  Location: [facility] OR;  Service: Orthopedics;  Laterality: Left;  . TUBAL LIGATION     Social History:  reports that she has never smoked. She has never used smokeless tobacco. She reports that she does not drink alcohol and does not use drugs.  Family / Support Systems Anticipated Caregiver: Daughter/brother/spouse Ability/Limitations of Caregiver: spouse limited physical assist Caregiver Availability: 24/7 (Brother [employer], dtr will assist)  Social History Preferred language: English Religion: Holiness Cultural Background: Worked at [employer]: Ass Write: Yes Marine scientist Issues: no   Abuse/Neglect Abuse/Neglect Assessment Can Be Completed: Yes Physical Abuse: Denies Verbal Abuse: Denies Sexual Abuse: Denies Exploitation of patient/patient's resources: Denies Self-Neglect: Denies  Emotional Status Pt's affect, behavior and adjustment status: dts reports slighty moody Recent Psychosocial Issues: no Psychiatric History: no Substance Abuse History: no  Patient / Family Perceptions, Expectations & Goals Pt/Family understanding of illness & functional limitations: yes Pt/family expectations/goals: Goal to discharge back home (depends on level of care)  [employer]: None Premorbid Home Care/DME Agencies: None Transportation available at discharge: family able to transport  Discharge Planning Living Arrangements: Spouse/significant other, Children Support Systems: Spouse/significant other, Children Type of Residence:  Private residence (1 Level home. No steps (hanicapped ass)) Insurance Resources: Media planner (specify) [facility]) Financial Screen Referred: No Living Expenses: Own Care Coordinator Anticipated Follow Up Needs: HH/OP Expected length of stay: 17-20 Days  Clinical Impression Sw entered room, introduced self, explained role and process. Called dtr at beside. Will continue to follow up with questions or concerns.   [name] 04/15/2020, 2:22 PM

## 2020-04-15 NOTE — Progress Notes (Signed)
Physical Therapy Session Note  Patient Details  [name] MRN: [id number] Date of Birth: 1946-09-18  Today's Date: 04/15/2020 PT Individual Time: 1030-1100 PT Individual Time Calculation (min): 30 min   Short Term Goals: Week 1:  PT Short Term Goal 1 (Week 1): Pt will perform supine<>sit with max assist of 1 PT Short Term Goal 2 (Week 1): Pt will demonstrate ability to maintain static sitting EOB with close supervision for 5 minutes PT Short Term Goal 3 (Week 1): Pt will participate in sitting balance outcome measure PT Short Term Goal 4 (Week 1): Pt will perform bed<>chair transfers with +2 max assist  Skilled Therapeutic Interventions/Progress Updates:     Patient in TIS w/c in the room upon PT arrival. Patient alert and agreeable to PT session. Patient holding/rubbing R side of her neck and grimacing at beginning of session. When asked if she was having neck pain patient nodded yes, RN made aware. PT provided repositioning with a pillow behind her neck and head and adjusted head rest, rest breaks, and distraction as pain interventions throughout session.   Therapeutic Activity: Patient performed reciprocal scooting R/L x3 in the TIS w/c for improved positioning with max A of 1-2 people and cues for head-hips relationship and HOH assist for placing R hand in her lap to prevent pushing throughout.   Neuromuscular Re-ed: Patient performed the following cervical, L LE, and L attention activities seated in the TIS w/c: -directed patient's attention to PT on the L using various visual stimuli, patient became more verbal and able to answer simple questions for object recondition and identification of family members in photos in her room; patient unable to sustain L focus >3-4 sec before returning eyes to midline and head slightly R of midline -focused on L cervical rotation with gentle manual facilitation and multimodal cues x8 with 5 sec hold/stretch -L knee flexion/extension 2x4  with AAROM and heavy cues for initiation -L hip flexion 2x3 with AAROM and multimodal cues for muscle activation  Patient in TIS w/c per patient request with pillows behind her head, L side of trunk, and under L UE for improved position at end of session with breaks locked, seat belt alarm set, and all needs within reach. Patient required increased time for initiation and attention throughout session. Improved verbalization throughout session with short phrases in response to simple cues.    Therapy Documentation Precautions:  Precautions Precautions: Fall, Other (comment) Precaution Comments: L hemi with hypertonia and L inattention Restrictions Weight Bearing Restrictions: No    Therapy/Group: Individual Therapy  Crickett Abbett L Araf Clugston PT, DPT  04/15/2020, 4:16 PM

## 2020-04-15 NOTE — Progress Notes (Signed)
Confirmed with pharmacy, my dose will be the last dose patient will receive for treatment from UTI. No complications noted at this time.  Jay Schlichter, LPN

## 2020-04-16 ENCOUNTER — Inpatient Hospital Stay (HOSPITAL_COMMUNITY): Payer: Medicare Other | Admitting: Speech Pathology

## 2020-04-16 ENCOUNTER — Inpatient Hospital Stay (HOSPITAL_COMMUNITY): Payer: Medicare Other | Admitting: Occupational Therapy

## 2020-04-16 ENCOUNTER — Inpatient Hospital Stay (HOSPITAL_COMMUNITY): Payer: Medicare Other | Admitting: Physical Therapy

## 2020-04-16 ENCOUNTER — Inpatient Hospital Stay (HOSPITAL_COMMUNITY): Payer: Medicare Other | Admitting: *Deleted

## 2020-04-16 LAB — GLUCOSE, CAPILLARY
Glucose-Capillary: 131 mg/dL — ABNORMAL HIGH (ref 70–99)
Glucose-Capillary: 171 mg/dL — ABNORMAL HIGH (ref 70–99)
Glucose-Capillary: 180 mg/dL — ABNORMAL HIGH (ref 70–99)
Glucose-Capillary: 259 mg/dL — ABNORMAL HIGH (ref 70–99)

## 2020-04-16 MED ORDER — METFORMIN HCL 500 MG PO TABS
500.0000 mg | ORAL_TABLET | Freq: Every day | ORAL | Status: DC
  Administered 2020-04-17 – 2020-05-05 (×19): 500 mg via ORAL
  Filled 2020-04-16 (×19): qty 1

## 2020-04-16 NOTE — Progress Notes (Signed)
Patient ID: Meredith Shaw, female   DOB: 1946/06/14, 74 y.o.   MRN: [id number]  SNf and HC options provided to daughter

## 2020-04-16 NOTE — Progress Notes (Signed)
Speech Language Pathology Daily Session Note  Patient Details  Name: MONDA CHASTAIN MRN: [id number] Date of Birth: 10/22/1945  Today's Date: 04/16/2020 SLP Individual Time: 0720-0815 SLP Individual Time Calculation (min): 55 min  Short Term Goals: Week 1: SLP Short Term Goal 1 (Week 1): Pt will consume dys 1 textures and honey thick liquids with mod assist for use of swallowing strategies to mitigate left anterior spillage of boluses. SLP Short Term Goal 2 (Week 1): Pt will consume therapeutic trials of dys 2 solids with mod assist cues for use of swallowing strategies to clear boluses from the oral cavity. SLP Short Term Goal 3 (Week 1): Pt will consume therapeutic trials of thin liquids over 3 consecutive sessions with out respiratory decompensation prior to repeat MBS. SLP Short Term Goal 4 (Week 1): Pt sill sustain her attention to basic familiar tasks for 3 minute intervals with mod assist multimodal cues for redirection. SLP Short Term Goal 5 (Week 1): Pt will complete basic, familiar tasks with max assist  multimodal cues for functional problem solving. SLP Short Term Goal 6 (Week 1): Pt will locate items to the left of midline during basic, familiar tasks with max assist multimodal cues.  Skilled Therapeutic Interventions: Skilled treatment session focused on cognitive and dysphagia goals. SLP facilitated session by providing extra time and Min A verbal and tactile cues for arousal and repositioned patient to maximize safety with PO intake. Patient consumed breakfast meal of Dys. 1 textures with honey-thick liquids with overt cough X 1, suspect due to poor attention to bolus. Throughout meal, patient required Mod A verbal cues to attend to left anterior spillage with mildly prolonged AP transit, however, swallow initiation appeared more timely compared to other noted. Min A verbal cues were also required for problem solving with self-feeding and for visual scanning to midline to find items  on tray. Patient left upright in bed with alarm on and RN present. Continue with current plan of care.      Pain Pain Assessment Pain Scale: 0-10 Pain Score: 0-No pain  Therapy/Group: Individual Therapy  Ethin Drummond 04/16/2020, 8:20 AM

## 2020-04-16 NOTE — Progress Notes (Signed)
Patient noted to have nose bleed upon assessment. On-call provider notified, and asked if this nurse should hold Q8 heparin injection. [name], NP ordered 22:00 dose held, and 06:00 to be held until evaluated by MD in AM.

## 2020-04-16 NOTE — Progress Notes (Signed)
Physical Therapy Session Note  Patient Details  [name] MRN: [id number] Date of Birth: 11/01/1945  Today's Date: 04/16/2020 PT Individual Time: 1035-1135 PT Individual Time Calculation (min): 60 min   Short Term Goals: Week 1:  PT Short Term Goal 1 (Week 1): Pt will perform supine<>sit with max assist of 1 PT Short Term Goal 2 (Week 1): Pt will demonstrate ability to maintain static sitting EOB with close supervision for 5 minutes PT Short Term Goal 3 (Week 1): Pt will participate in sitting balance outcome measure PT Short Term Goal 4 (Week 1): Pt will perform bed<>chair transfers with +2 max assist  Skilled Therapeutic Interventions/Progress Updates: Pt presented in bed awake and alert, agreeable to therapy. Pt denies pain at start of session. Pt performed rolling to L maxA x 1, R total A to check brief and to don pants total A. Performed supine to sit EOB maxA x 2 with heavy posterior lean upon sitting requiring mor to maxA to maintain neutral. PTA donned shirt maxA. Total A for SB set up and performed SB transfer to L maxA x 2. PTA able to smaller TIS as current TIS is too wide however unable to obtain appropriately sized cushion. Pt transported to orho gym once +2 no longer available and attempted to participate in [employer] for scanning and reaching. Pt required mod cues to scan to midline and max multimodal cues with increased stimulation to L to scan past midline to locate light. Pt able to locate x 3 lights in 5 min activity. PTA then spent remaining time working on repositioning with PTA placing rolled blanket on L side to promote trunk elongation ad neutral positioning. Pt remained in TIS at end of session and left with belt alarm on, call bell within reach and current needs met.      Therapy Documentation Precautions:  Precautions Precautions: Fall, Other (comment) Precaution Comments: L hemi with hypertonia and L inattention Restrictions Weight Bearing Restrictions:  No    Therapy/Group: Individual Therapy      , PTA  04/16/2020, 2:56 PM  

## 2020-04-16 NOTE — Patient Care Conference (Signed)
Inpatient RehabilitationTeam Conference and Plan of Care Update Date: 04/16/2020   Time: 1:10 PM    Patient [name]      Medical Record Number: [id number]  Date of Birth: Nov 15, 1945 Sex: Female         Room/Bed: 4W20C/4W20C-01 Payor Info: Payor: Advertising copywriter MEDICARE / Plan: [facility] MEDICARE / Product Type: *No Product type* /    Admit Date/Time:  04/11/2020  6:15 PM  Primary Diagnosis:  <principal problem not specified>  Hospital Problems: Active Problems:   Pressure injury of skin   Right middle cerebral artery stroke [facility])    Expected Discharge Date: Expected Discharge Date: 05/07/20  Team Members Present: Physician leading conference: Dr. Claudette Laws Care Coodinator Present: Chana Bode, RN, BSN, CRRN;Christina Rochester, BSW Nurse Present: Margot Ables, LPN PT Present: Grier Rocher, PT;Rosita Dechalus, PTA OT Present: Jackquline Denmark, OT SLP Present: Feliberto Gottron, SLP PPS Coordinator present : Fae Pippin, SLP     Current Status/Progress Goal Weekly Team Focus  Bowel/Bladder   patient incoontinent  to begin voidingon her won  toilette q 2 hours while awake. Assess q shift and prn. In and out cath q 6 hours for volumes over 350.   Swallow/Nutrition/ Hydration   Dys. 1 textures with honey-thick liquids, Mod-Max A  Min A  trials of upgraded textures, increased use of swallowing compensatory strategies   ADL's   Total Ax2 for BADLs bedlevel, no toilet transfer attempted yet  Supervision-Min A overall, Mod A LB dressing + toileting  Functional transfers, NMR, functional cognition, trunk control, praxis   Mobility   maxA 1-2 bed mobility, total A STS in [place], poor sitting balance with posterior lean able to correct with verbal cues, L inattention  minA bed mobility and transfers  sitting balance, bed mobility, transfers, d/c planning   Communication   Min-Mod A  Supervision  increase use of speech intelligibility strategies   Safety/Cognition/  Behavioral Observations  Max A  Min A  attention, visual scaning to midline, basic recall   Pain   Pt compplais of pain in her neck at level 6 out of 10  Reduce or eliminate pain   Assess pain q shift and administer medications as needed    Skin   Pt has 3 small stage 1 woundson her buttocks   refrain from furthur infection  Assess skin q shift and prn, apply foam dressing to bottom      Team Discussion:  Discharge Planning/Teaching Needs:  Goal is for patient to discharge home, if unable SNF  Jena Gauss with DTR   Current Update: on target  Current Barriers to Discharge:  Incontinence , requiring Intermittent catheterizations q 6 hours Nutritional means; currently on D1 Honey thick diet with poor appetite Right side jerking motions/spasms Possible Resolutions to Barriers: Medications for retention reviewed Inquire about location of dentures so the patient can eat MD discontinued amanadine 2/2 correlation with jerky motion  Patient on target to meet rehab goals: no, Goals downgraded as lofty goals set initially. Goals changed to Mod assist overall for discharge  *See Care Plan and progress notes for long and short-term goals.   Revisions to Treatment Plan:  Work on visual scanning cues and allow time to process information, address posterior lean and left neglect.  Discuss possible SNF placement due to amount of assistance required for care    Medical Summary Current Status: poor diabetic control. Needs caths , occ incont Weekly Focus/Goal: trial of bladder meds  Barriers to Discharge:  Medical stability   Possible Resolutions to Barriers: med mangement for DM adn urinary incont   Continued Need for Acute Rehabilitation Level of Care: The patient requires daily medical management by a physician with specialized training in physical medicine and rehabilitation for the following reasons: Direction of a multidisciplinary physical rehabilitation program to maximize  functional independence : Yes Medical management of patient stability for increased activity during participation in an intensive rehabilitation regime.: Yes Analysis of laboratory values and/or radiology reports with any subsequent need for medication adjustment and/or medical intervention. : Yes   I attest that I was present, lead the team conference, and concur with the assessment and plan of the team.   Chana Bode B 04/16/2020, 1:10 PM

## 2020-04-16 NOTE — Progress Notes (Signed)
Patient ID: Meredith Shaw, female   DOB: 05-11-1946, 74 y.o.   MRN: [id number]  Team Conference Report to Patient/Family  Team Conference discussion was reviewed with the patient and caregiver, including goals, any changes in plan of care and target discharge date.  Patient and caregiver express understanding and are in agreement.  The patient has a target discharge date of 05/07/20.  [name] 04/16/2020, 1:51 PM

## 2020-04-16 NOTE — Progress Notes (Signed)
Occupational Therapy Session Note  Patient Details  [name] MRN: [id number] Date of Birth: November 24, 1945  Today's Date: 04/16/2020 OT Individual Time: 1517-1600 OT Individual Time Calculation (min): 43 min    Short Term Goals: Week 1:  OT Short Term Goal 1 (Week 1): Pt will sit EOB/EOM for 5 min with MOD A of 1 for sitting balance during functional activity OT Short Term Goal 2 (Week 1): Pt will terminate washing face with no VC OT Short Term Goal 3 (Week 1): Pt will locate 2/2 grooming items on L side of sink wiht min VC OT Short Term Goal 4 (Week 1): Pt will complete 1/4 steps of donning shirt with multimodal cuing OT Short Term Goal 5 (Week 1): Pt will transfer to w/c with MAX A of 2 in prep for [facility] transfer  Skilled Therapeutic Interventions/Progress Updates:    Upon entering the room, pt supine in bed with daughter present in the room. Pt appears very fatigued and this is last scheduled session of the day. OT providing paper handout for L UE passive ROM for stretching. Caregiver demonstrated exercises in all planes of movement with min cuing for technique. Pt also performing some self ROM exercises with hand over hand assistance in order to have pt visually locate/scan and lock at L UE with movement. Pt reports L UE feels numb and heavy during this session. TV being turned off in order to decrease external distractions. OT attempting some visual task but pt having difficulty attending to assessment but will continue to assess/address during therapy sessions. OT also discussed importance of neutral position for L UE when pt in bed and educated caregiver on how to don resting have splint. She returned demonstrations and splint applied correctly. Pt remained in bed with call bell and all needed items within reach. Caregiver remains within room.   Therapy Documentation Precautions:  Precautions Precautions: Fall, Other (comment) Precaution Comments: L hemi with hypertonia and L  inattention Restrictions Weight Bearing Restrictions: No   Therapy/Group: Individual Therapy  Alen Bleacher 04/16/2020, 4:12 PM

## 2020-04-16 NOTE — [place] FL2 (Addendum)
[place] MEDICAID FL2 LEVEL OF CARE SCREENING TOOL     IDENTIFICATION  Patient [name] Birthdate: 10-Mar-1946 Sex: female Admission Date (Current Location): 04/11/2020  [facility] and [place] Number:  Producer, television/film/video and Address:  [facility]. [facility], [address] [place], [place] [address]      Provider Number:    Attending Physician Name and Address:  Erick Colace, MD  Relative Name and Phone Number:  [name] [phone]    Current Level of Care: Hospital Recommended Level of Care: Skilled Nursing Facility Prior Approval Number:    Date Approved/Denied:   PASRR Number:   [id number]  Discharge Plan: SNF    Current Diagnoses: Patient Active Problem List   Diagnosis Date Noted  . Dysphagia due to recent cerebral infarction 04/11/2020  . 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 Slow transit constipation   . Dyslipidemia   . Hemorrhagic cystitis   . Benign essential HTN   . UTI (urinary tract infection), bacterial   . Urinary tract infection with hematuria   . AKI (acute kidney injury) (HCC)   . Acute ischemic right MCA stroke [facility]) s/p tPA & attempted revascularization, embolic stroke, source unknown 03/24/2020  . 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 Acquired hypothyroidism 08/15/2019  . Hyperlipidemia associated with type 2 diabetes mellitus (HCC) 05/14/2019  . Diabetic retinopathy (HCC) 11/09/2018  . Closed fracture of femur, intertrochanteric, right, initial encounter (HCC) 08/19/2018  . Type 2 diabetes mellitus with complication, without long-term current use of insulin (HCC) 02/15/2018  . Closed left hip fracture (HCC) 02/10/2018  . CKD stage 3 due to type 2 diabetes mellitus (HCC) 02/10/2018  . Hip fracture (HCC) 02/10/2018  . Pressure injury of skin 02/10/2018  . Arthritis of left knee 04/09/2014  . Status post total knee replacement 04/09/2014  . Arthropathy   . Hypertension  associated with type 2 diabetes mellitus (HCC)   . Kidney stones   . Postablative hypothyroidism   . Degenerative arthritis of knee 06/09/2012  . Flatulence, eructation, and gas pain 12/24/2005  . Abdominal or pelvic swelling, mass, or lump, other specified site 12/24/2005  . Age-related osteoporosis with current pathological fracture with routine healing 12/04/2004  . Symptomatic menopausal or female climacteric states 12/04/2004  . Arthropathy, lower leg 04/21/2004  . Hematuria 09/17/2003  . Other long term (current) drug therapy 07/05/2002    Orientation RESPIRATION BLADDER Height & Weight     Self  Normal Incontinent Weight: 160 lb 7.9 oz (72.8 kg) Height:  5\' 7"  (170.2 cm)  BEHAVIORAL SYMPTOMS/MOOD NEUROLOGICAL BOWEL NUTRITION STATUS      Incontinent Diet  AMBULATORY STATUS COMMUNICATION OF NEEDS Skin   Extensive Assist Verbally PU Stage and Appropriate Care (3 small stage 1's on bottom)                       Personal Care Assistance Level of Assistance  Bathing, Feeding, Dressing Bathing Assistance: Maximum assistance Feeding assistance: Maximum assistance Dressing Assistance: Maximum assistance     Functional Limitations Info             SPECIAL CARE FACTORS FREQUENCY                       Contractures Contractures Info: Not present    Additional Factors Info                  Current Medications (  04/16/2020):  This is the current hospital active medication list Current Facility-Administered Medications  Medication Dose Route Frequency Provider Last Rate Last Admin  . acetaminophen (TYLENOL) tablet 650 mg  650 mg Oral Q4H PRN Charlton Amor, PA-C   650 mg at 04/16/20 0805   Or  . acetaminophen (TYLENOL) 160 MG/5ML solution 650 mg  650 mg Per Tube Q4H PRN Angiulli, Mcarthur Rossetti, PA-C       Or  . acetaminophen (TYLENOL) suppository 650 mg  650 mg Rectal Q4H PRN Angiulli, Mcarthur Rossetti, PA-C      . amLODipine (NORVASC) tablet 10 mg  10 mg Oral Daily  Charlton Amor, PA-C   10 mg at 04/16/20 0805  . aspirin EC tablet 81 mg  81 mg Oral Daily Charlton Amor, PA-C   81 mg at 04/16/20 0805  . bisacodyl (DULCOLAX) EC tablet 5 mg  5 mg Oral Daily AngiulliMcarthur Rossetti, PA-C   5 mg at 04/16/20 0805  . ezetimibe (ZETIA) tablet 10 mg  10 mg Oral Daily Charlton Amor, PA-C   10 mg at 04/16/20 0805  . heparin injection 5,000 Units  5,000 Units Subcutaneous Q8H Charlton Amor, PA-C   5,000 Units at 04/16/20 6440  . insulin aspart (novoLOG) injection 0-9 Units  0-9 Units Subcutaneous TID WC AngiulliMcarthur Rossetti, PA-C   2 Units at 04/16/20 1148  . insulin detemir (LEVEMIR) injection 12 Units  12 Units Subcutaneous BID Ranelle Oyster, MD   12 Units at 04/16/20 [phone]  . levothyroxine (SYNTHROID) tablet 125 mcg  125 mcg Oral Q0600 Erick Colace, MD   125 mcg at 04/16/20 0615  . [START ON 04/17/2020] metFORMIN (GLUCOPHAGE) tablet 500 mg  500 mg Oral Q breakfast Kirsteins, Victorino Sparrow, MD      . metoprolol tartrate (LOPRESSOR) tablet 25 mg  25 mg Oral BID Charlton Amor, PA-C   25 mg at 04/16/20 0805  . multivitamin with minerals tablet 1 tablet  1 tablet Oral Daily Charlton Amor, PA-C   1 tablet at 04/16/20 0805  . pantoprazole sodium (PROTONIX) 40 mg/20 mL oral suspension 40 mg  40 mg Per Tube Daily Kirsteins, Victorino Sparrow, MD   40 mg at 04/16/20 0806  . polyethylene glycol (MIRALAX / GLYCOLAX) packet 17 g  17 g Oral Daily Charlton Amor, PA-C   17 g at 04/16/20 0805  . potassium chloride (KLOR-CON) CR tablet 10 mEq  10 mEq Oral Daily Kirsteins, Victorino Sparrow, MD   10 mEq at 04/16/20 0805  . pravastatin (PRAVACHOL) tablet 40 mg  40 mg Oral q1800 Charlton Amor, PA-C   40 mg at 04/15/20 2305  . Resource ThickenUp Clear   Oral PRN Angiulli, Mcarthur Rossetti, PA-C      . senna-docusate (Senokot-S) tablet 1 tablet  1 tablet Oral BID Charlton Amor, PA-C   1 tablet at 04/16/20 2595     Discharge Medications: Please see discharge summary for a  list of discharge medications.  Relevant Imaging Results:  Relevant Lab Results:   Additional Information    [name]

## 2020-04-16 NOTE — Progress Notes (Addendum)
[place] PHYSICAL MEDICINE & REHABILITATION PROGRESS NOTE   Subjective/Complaints:  Sleeping but awakens to voice and phsical stim , occ twitch mainly RUE and RLE, per RN this has been since CVA  ROS: limited d/t cognition     Objective:   No results found. Recent Labs    04/14/20 0739  WBC 9.7  HGB 11.5*  HCT 35.2*  PLT 290   Recent Labs    04/14/20 0739  NA 140  K 3.3*  CL 106  CO2 23  GLUCOSE 189*  BUN 15  CREATININE 1.05*  CALCIUM 8.9    Intake/Output Summary (Last 24 hours) at 04/16/2020 0933 Last data filed at 04/16/2020 0806 Gross per 24 hour  Intake 120 ml  Output 1300 ml  Net -1180 ml     Physical Exam: Vital Signs Blood pressure (!) 155/87, pulse 74, temperature 98 F (36.7 C), resp. rate 18, height 5' 7"  (1.702 m), weight 72.8 kg, SpO2 98 %.     General: No acute distress Mood and affect are appropriate Heart: Regular rate and rhythm no rubs murmurs or extra sounds Lungs: Clear to auscultation, breathing unlabored, no rales or wheezes Abdomen: Positive bowel sounds, soft nontender to palpation, nondistended Extremities: No clubbing, cyanosis, or edema Skin: No evidence of breakdown, no evidence of rash  Musculoskeletal: Full range of motion in all 4 extremities. No joint swelling  Skin: loop site without bleeding today- moderate ecchymosis and small hematoma Neurologic: Left central 7. Decreased oro-motor control. motor strength is 5/5 in right and 0/5 left deltoid, bicep, tricep, grip, hip flexor, knee extensors, ankle dorsiflexor and plantar flexor Sensory exam reduced pinch on left, left inattention.   Musculoskeletal: Full range of motion in all 4 extremities. No joint swelling     Assessment/Plan: 1. 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  which require 3+ hours per day of interdisciplinary therapy in a comprehensive inpatient rehab setting.  Physiatrist is providing close team supervision and 24 hour management of  active medical problems listed below.  Physiatrist and rehab team continue to assess barriers to discharge/monitor patient progress toward functional and medical goals  Care Tool:  Bathing    Body parts bathed by patient: Abdomen, Face   Body parts bathed by helper: Right arm, Left arm, Chest, Front perineal area, Buttocks, Right upper leg, Left upper leg, Right lower leg, Left lower leg     Bathing assist Assist Level: Total Assistance - Patient < 25%     Upper Body Dressing/Undressing Upper body dressing   What is the patient wearing?: Hospital gown only    Upper body assist Assist Level: Dependent - Patient 0%    Lower Body Dressing/Undressing Lower body dressing      What is the patient wearing?: Incontinence brief     Lower body assist Assist for lower body dressing: Dependent - Patient 0%     Toileting Toileting Toileting Activity did not occur (Clothing management and hygiene only): N/A (no void or bm)  Toileting assist Assist for toileting: Dependent - Patient 0%     Transfers Chair/bed transfer  Transfers assist  Chair/bed transfer activity did not occur: Safety/medical concerns  Chair/bed transfer assist level: 2 Helpers     Locomotion Ambulation   Ambulation assist   Ambulation activity did not occur: Safety/medical concerns          Walk 10 feet activity   Assist  Walk 10 feet activity did not occur: Safety/medical concerns  Walk 50 feet activity   Assist Walk 50 feet with 2 turns activity did not occur: Safety/medical concerns         Walk 150 feet activity   Assist Walk 150 feet activity did not occur: Safety/medical concerns         Walk 10 feet on uneven surface  activity   Assist Walk 10 feet on uneven surfaces activity did not occur: Safety/medical concerns         Wheelchair     Assist Will patient use wheelchair at discharge?:  (TBD)             Wheelchair 50 feet with 2 turns  activity    Assist            Wheelchair 150 feet activity     Assist          Blood pressure (!) 155/87, pulse 74, temperature 98 F (36.7 C), resp. rate 18, height 5' 7"  (1.702 m), weight 72.8 kg, SpO2 98 %.  Medical Problem List and Plan: 1.  Left side weakness with dysarthria and dysphagia secondary to right MCA infarction status post TPA and IR with TIC13 revascularization reoccluded on follow-up, infarct embolic secondary to unknown source.  Status post loop recorder             -patientmay shower             -ELOS/Goals: 23-28 days/Min/Mod A             --Continue CIR therapies including PT, OT, and SLP - Team conference today please see physician documentation under team conference tab, met with team  to discuss problems,progress, and goals. Formulized individual treatment plan based on medical history, underlying problem and comorbidities.  2.  Antithrombotics: -DVT/anticoagulation: Subcutaneous heparin             -antiplatelet therapy: Aspirin 81 mg daily 3. Pain Management: Tylenol as needed             Monitor for headaches with increased activity. 4. Mood: Amantadine 150 mg daily for stimulation, tremors may be side effects will D/C             -antipsychotic agents: N/A 5. Neuropsych: This patient is not capable of making decisions on her own behalf. 6. Skin/Wound Care: Routine skin checks, some bleeding from loop site but no hematoma 7. Fluids/Electrolytes/Nutrition: Routine in and outs. CMP ordered.  HypoK, will supplement  8.  Hypertension.  Lopressor 25 mg twice daily, Norvasc 10 mg daily.           -bp's higher in the early am before meds.   Vitals:   04/15/20 1921 04/16/20 0440  BP: 139/83 (!) 155/87  Pulse: 66 74  Resp: 18 18  Temp: 98.3 F (36.8 C) 98 F (36.7 C)  SpO2: 97% 98%   Labile  7/7- elevated prior to am meds - monitor 9.  Post stroke dysphagia: Dysphagia #1 honey thick liquids.  Patient on IV fluids for hydration follow-up speech  therapy             Advance diet as tolerated 10.  Hemorrhagic cystitis complicated by UTI.  Complete course of Ancef.  Follow-up outpatient urology 11.  Diabetes mellitus with peripheral neuropathy.  Hemoglobin A1c 8.9.  Levemir 10 units twice daily.  Check blood sugars before meals and at bedtime CBG (last 3)  Recent Labs    04/15/20 1633 04/15/20 2057 04/16/20 0616  GLUCAP 227* 224* 131*  elevated 7/5,  increase levemir to 12u bid, monitor effect , pm elevation will add am metformin 578m - monitor renal status               Monitor with increased mobility 12.  Hyperlipidemia.  Zetia/Pravacol 13.  Constipation.  MiraLAX. Adjust bowel meds as necessary    LOS: 5 days A FACE TO FACE EVALUATION WAS PERFORMED  ACharlett Blake7/04/2020, 9:33 AM

## 2020-04-16 NOTE — Progress Notes (Signed)
Physical Therapy Session Note  Patient Details  [name] MRN: [id number] Date of Birth: January 04, 1946  Today's Date: 04/16/2020 PT Individual Time: 0370-9643 PT Individual Time Calculation (min): 43 min   Short Term Goals: Week 1:  PT Short Term Goal 1 (Week 1): Pt will perform supine<>sit with max assist of 1 PT Short Term Goal 2 (Week 1): Pt will demonstrate ability to maintain static sitting EOB with close supervision for 5 minutes PT Short Term Goal 3 (Week 1): Pt will participate in sitting balance outcome measure PT Short Term Goal 4 (Week 1): Pt will perform bed<>chair transfers with +2 max assist  Skilled Therapeutic Interventions/Progress Updates:   Pt received sitting in WC and agreeable to PT  Sit<>stand at trail in hall x 3 with max assist on the 1st attempt and total assist on second and third attempt to block the L knee and facilitate anterior weight shift.. Decreasing initiation with increased repetitions.   PT provided pt with 20x18 WC with Roho cushion to improve posture control, improved sitting tolerance, and pressure relief. Seated AAROm for BLE into flexion/extension in symmetry x 10 BLE with noted flexor tone in the LLE.   Pt returned to room and performed SB transfer to bed with total A +2 with bobath technique to facilitate anterior weight shift. Sit>supine completed with total A +2, and left supine in bed with call bell in reach and all needs met.       Therapy Documentation Precautions:  Precautions Precautions: Fall, Other (comment) Precaution Comments: L hemi with hypertonia and L inattention Restrictions Weight Bearing Restrictions: No   Pain:   denies   Therapy/Group: Individual Therapy  Lorie Phenix 04/16/2020, 2:47 PM

## 2020-04-17 ENCOUNTER — Inpatient Hospital Stay (HOSPITAL_COMMUNITY): Payer: Medicare Other | Admitting: Physical Therapy

## 2020-04-17 ENCOUNTER — Inpatient Hospital Stay (HOSPITAL_COMMUNITY): Payer: Medicare Other

## 2020-04-17 ENCOUNTER — Inpatient Hospital Stay (HOSPITAL_COMMUNITY): Payer: Medicare Other | Admitting: Occupational Therapy

## 2020-04-17 LAB — GLUCOSE, CAPILLARY
Glucose-Capillary: 122 mg/dL — ABNORMAL HIGH (ref 70–99)
Glucose-Capillary: 213 mg/dL — ABNORMAL HIGH (ref 70–99)
Glucose-Capillary: 196 mg/dL — ABNORMAL HIGH (ref 70–99)
Glucose-Capillary: 255 mg/dL — ABNORMAL HIGH (ref 70–99)

## 2020-04-17 MED ORDER — TRAMADOL HCL 50 MG PO TABS
50.0000 mg | ORAL_TABLET | Freq: Once | ORAL | Status: AC
  Administered 2020-04-17: 50 mg via ORAL
  Filled 2020-04-17: qty 1

## 2020-04-17 NOTE — Progress Notes (Signed)
Speech Language Pathology Daily Session Note  Patient Details  [name] MRN: [id number] Date of Birth: 04-12-1946  Today's Date: 04/17/2020 SLP Individual Time: 1005-1100 SLP Individual Time Calculation (min): 55 min  Short Term Goals: Week 1: SLP Short Term Goal 1 (Week 1): Pt will consume dys 1 textures and honey thick liquids with mod assist for use of swallowing strategies to mitigate left anterior spillage of boluses. SLP Short Term Goal 2 (Week 1): Pt will consume therapeutic trials of dys 2 solids with mod assist cues for use of swallowing strategies to clear boluses from the oral cavity. SLP Short Term Goal 3 (Week 1): Pt will consume therapeutic trials of thin liquids over 3 consecutive sessions with out respiratory decompensation prior to repeat MBS. SLP Short Term Goal 4 (Week 1): Pt sill sustain her attention to basic familiar tasks for 3 minute intervals with mod assist multimodal cues for redirection. SLP Short Term Goal 5 (Week 1): Pt will complete basic, familiar tasks with max assist  multimodal cues for functional problem solving. SLP Short Term Goal 6 (Week 1): Pt will locate items to the left of midline during basic, familiar tasks with max assist multimodal cues.  Skilled Therapeutic Interventions: Skilled ST services focused on swallow and cognitive skills. Pt's daughter was present session and expressed concerns about dentures not being removed at night. SLP posted sign above bed tor remind staff. Pt completed oral care with tooth brush requiring mod A for thoroughness and ultimately swallowed toothpaste/liquid rinse, however brushing dentures within her oral cavity is unfamiliar at baseline. Pt consumed 4oz of thin liquid via tsp x2 and cups sips, with mild anterior spillage and mild oral holding noted on larger verse smaller bolus sizes. Pt demonstrated only x1 immediate cough (swallowing liquid rinse during oral care.) SLP recommends to continue thin liquids  trials prior to repeat MBS. SLP facilitated sustained attention and basic problem solving skills when sorting cards by two colors, pt initially required max A verbal cues for initiation/sustained attention, however faded to min A verbal cues 1/2 way through task. Pt required supervision A verbal cues for problem solving. SLP also facilitated sustained attention, initiation and problem solving skills in novel card task (WAR), pt demonstrated ability name highest card min A increasing to mod A verbal cues , due to increase lethargy and reduced sustained attention. Pt began demonstrating difficulty keeping eyes open, noting increase of delayed processing and required max A verbal cues for initiation. Pt was left in room with daughter, call bell within reach and chair alarm set. ST recommends to continue skilled ST services.      Pain Pain Assessment Pain Score: 0-No pain  Therapy/Group: Individual Therapy  Jerimah Witucki  [facility] 04/17/2020, 12:33 PM

## 2020-04-17 NOTE — Progress Notes (Signed)
Patient scanned at 6 hour mark from last cath/void for . Patient attempted to void on bedpan, but unsuccessful. Jesusita Oka, PA asked if patient should be cathed for no void in 6hr or wait until patient bladder scans for >/= ; ordered not to cath until patient scans for >/= .

## 2020-04-17 NOTE — Progress Notes (Signed)
Patient ID: Meredith Shaw, female   DOB: [birth year], 74 y.o.   MRN: [id number]   [facility] declined due to no bed availability

## 2020-04-17 NOTE — Progress Notes (Signed)
Occupational Therapy Session Note  Patient Details  [name] MRN: [id number] Date of Birth: 1945-11-29  Today's Date: 04/17/2020 OT Individual Time: 5638-7564 OT Individual Time Calculation (min): 68 min  and Today's Date: 04/17/2020      Short Term Goals: Week 1:  OT Short Term Goal 1 (Week 1): Pt will sit EOB/EOM for 5 min with MOD A of 1 for sitting balance during functional activity OT Short Term Goal 2 (Week 1): Pt will terminate washing face with no VC OT Short Term Goal 3 (Week 1): Pt will locate 2/2 grooming items on L side of sink wiht min VC OT Short Term Goal 4 (Week 1): Pt will complete 1/4 steps of donning shirt with multimodal cuing OT Short Term Goal 5 (Week 1): Pt will transfer to w/c with MAX A of 2 in prep for [facility] transfer  Skilled Therapeutic Interventions/Progress Updates:    Upon entering the room, pt seated up in wheelchair with caregiver present in the room. Pt still in same clothing from yesterday and appearing lethargic this session. Total A slide board transfer to the R with second helper holding equipment. Sit >supine with total A as well. Pt was able to remove bottom dentures herself and OT assisted with top and placed them into container to be cleaned. Pt utilized suction toothbrush for oral care this session with mod cuing to locate item on the L side. Pt washing chest and abdomen and able to wash portion of L UE when cued to do so. Pt was unware of incontinence of BM and needing total A +2 for hygiene and clothing management this session. Pt needing max - total A to roll L <> R this session. Pt's caregiver present at end of session. Pt positioned into R side lying while allowed pt to turn head more at midline in order to see environment vs. Looking at bed rail. Pt did request pain medication for R LE at end of session and RN notified. Pt remained in bed to rest with bed alarm activated and all needed items within reach.   Therapy  Documentation Precautions:  Precautions Precautions: Fall, Other (comment) Precaution Comments: L hemi with hypertonia and L inattention Restrictions Weight Bearing Restrictions: No   Therapy/Group: Individual Therapy  Alen Bleacher 04/17/2020, 12:23 PM

## 2020-04-17 NOTE — Progress Notes (Signed)
Physical Therapy Session Note  Patient Details  [name] MRN: [id number] Date of Birth: 10/03/1946  Today's Date: 04/17/2020 PT Individual Time: 0900-1000 PT Individual Time Calculation (min): 60 min   Short Term Goals: Week 1:  PT Short Term Goal 1 (Week 1): Pt will perform supine<>sit with max assist of 1 PT Short Term Goal 2 (Week 1): Pt will demonstrate ability to maintain static sitting EOB with close supervision for 5 minutes PT Short Term Goal 3 (Week 1): Pt will participate in sitting balance outcome measure PT Short Term Goal 4 (Week 1): Pt will perform bed<>chair transfers with +2 max assist   Skilled Therapeutic Interventions/Progress Updates:   Pt received supine in bed and agreeable to PT. Rolling R and L to don pants with max A +2 for time management and safety max cues for positioning, sequencing and attention to task. Supine>sit with total A +2 on the R side of the bed. +2 assist to prevent posterior LOB x 5 minutes with multimodal cues for trunk control and anterior weight shifting as well as proper LE position to improve pelvic position and control.   SB transfer to WC with total +2 assist to prevent anterior slide of SB and facilitate adequate anterior weight shift.   Pt transported in TIS WC to orthogym. Pt brought into standing using the standing frame. Pt able to maintain standing x 30minutes. Midline orientation with use of visual feedback from mirror to hold midline 2 x 2 minutes. Lateral reaches to the R to force lateral weight shift 10 x 30 sec with mod-max assist at achieve reach out side BOS. Pt performed partial sitting in sling to improve symmetrical WB and reduce pushers response through RLE. Pt also required to have LLE blocked to prevent HS flexor tone.   Patient returned to room and left sitting in WC with call bell in reach and all needs met.              Therapy Documentation Precautions:  Precautions Precautions: Fall, Other  (comment) Precaution Comments: L hemi with hypertonia and L inattention Restrictions Weight Bearing Restrictions: No   Pain: Pain Assessment Pain Scale: 0-10 Pain Score: 5  Pain Type: Acute pain Pain Location: Leg Pain Orientation: Right Pain Descriptors / Indicators: Discomfort Pain Frequency: Constant Pain Onset: On-going Patients Stated Pain Goal: 2 Pain Intervention(s): Medication (See eMAR)    Therapy/Group: Individual Therapy   E  04/17/2020, 11:32 AM  

## 2020-04-17 NOTE — Progress Notes (Signed)
[facility] PHYSICAL MEDICINE & REHABILITATION PROGRESS NOTE   Subjective/Complaints:  Pt much more alert today ate >50% breakfast , no pain c/os Oriented to person, hosptal and CVA but not time   ROS: limited d/t cognition     Objective:   No results found. No results for input(s): WBC, HGB, HCT, PLT in the last 72 hours. No results for input(s): NA, K, CL, CO2, GLUCOSE, BUN, CREATININE, CALCIUM in the last 72 hours.  Intake/Output Summary (Last 24 hours) at 04/17/2020 0832 Last data filed at 04/17/2020 0800 Gross per 24 hour  Intake 360 ml  Output 1100 ml  Net -740 ml     Physical Exam: Vital Signs Blood pressure (!) 149/86, pulse 72, temperature 97.8 F (36.6 C), temperature source Oral, resp. rate 18, height 5\' 7"  (1.702 m), weight 74.1 kg, SpO2 95 %.  General: No acute distress Mood and affect are appropriate Heart: Regular rate and rhythm no rubs murmurs or extra sounds Lungs: Clear to auscultation, breathing unlabored, no rales or wheezes Abdomen: Positive bowel sounds, soft nontender to palpation, nondistended Extremities: No clubbing, cyanosis, or edema Skin: No evidence of breakdown, no evidence of rash  Musculoskeletal: Full range of motion in all 4 extremities. No joint swelling  Skin: loop site without bleeding today- moderate ecchymosis and small hematoma Neurologic: Left central 7. Decreased oro-motor control. motor strength is 5/5 in right and 0/5 left deltoid, bicep, tricep, grip, hip flexor, knee extensors, ankle dorsiflexor and plantar flexor Sensory exam reduced pinch on left, left inattention.   Musculoskeletal: Full range of motion in all 4 extremities. No joint swelling     Assessment/Plan: 1. 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  which require 3+ hours per day of interdisciplinary therapy in a comprehensive inpatient rehab setting.  Physiatrist is providing close team supervision and 24 hour management of active medical problems  listed below.  Physiatrist and rehab team continue to assess barriers to discharge/monitor patient progress toward functional and medical goals  Care Tool:  Bathing    Body parts bathed by patient: Abdomen, Face   Body parts bathed by helper: Right arm, Left arm, Chest, Front perineal area, Buttocks, Right upper leg, Left upper leg, Right lower leg, Left lower leg     Bathing assist Assist Level: Total Assistance - Patient < 25%     Upper Body Dressing/Undressing Upper body dressing   What is the patient wearing?: Hospital gown only    Upper body assist Assist Level: Dependent - Patient 0%    Lower Body Dressing/Undressing Lower body dressing      What is the patient wearing?: Incontinence brief     Lower body assist Assist for lower body dressing: Dependent - Patient 0%     Toileting Toileting Toileting Activity did not occur (Clothing management and hygiene only): N/A (no void or bm)  Toileting assist Assist for toileting: Dependent - Patient 0%     Transfers Chair/bed transfer  Transfers assist  Chair/bed transfer activity did not occur: Safety/medical concerns  Chair/bed transfer assist level: 2 Helpers     Locomotion Ambulation   Ambulation assist   Ambulation activity did not occur: Safety/medical concerns          Walk 10 feet activity   Assist  Walk 10 feet activity did not occur: Safety/medical concerns        Walk 50 feet activity   Assist Walk 50 feet with 2 turns activity did not occur: Safety/medical concerns  Walk 150 feet activity   Assist Walk 150 feet activity did not occur: Safety/medical concerns         Walk 10 feet on uneven surface  activity   Assist Walk 10 feet on uneven surfaces activity did not occur: Safety/medical concerns         Wheelchair     Assist Will patient use wheelchair at discharge?:  (TBD)             Wheelchair 50 feet with 2 turns activity    Assist             Wheelchair 150 feet activity     Assist          Blood pressure (!) 149/86, pulse 72, temperature 97.8 F (36.6 C), temperature source Oral, resp. rate 18, height 5\' 7"  (1.702 m), weight 74.1 kg, SpO2 95 %.  Medical Problem List and Plan: 1.  Left side weakness with dysarthria and dysphagia secondary to right MCA infarction status post TPA and IR with TIC13 revascularization reoccluded on follow-up, infarct embolic secondary to unknown source.  Status post loop recorder             -patientmay shower             -ELOS/Goals: 7-28 min/modA goals             --Continue CIR therapies including PT, OT, and SLP - 2.  Antithrombotics: -DVT/anticoagulation: Subcutaneous heparin             -antiplatelet therapy: Aspirin 81 mg daily 3. Pain Management: Tylenol as needed             Monitor for headaches with increased activity. 4. Mood: Amantadine 150 mg daily for stimulation, tremors may be side effects will D/C             -antipsychotic agents: N/A 5. Neuropsych: This patient is not capable of making decisions on her own behalf. 6. Skin/Wound Care: Routine skin checks, some bleeding from loop site but no hematoma 7. Fluids/Electrolytes/Nutrition: Routine in and outs. CMP ordered.  HypoK, will supplement  8.  Hypertension.  Lopressor 25 mg twice daily, Norvasc 10 mg daily.           -bp's higher in the early am before meds.   Vitals:   04/16/20 1959 04/17/20 0427  BP: 133/68 (!) 149/86  Pulse: 75 72  Resp: 16 18  Temp: (!) 97.4 F (36.3 C) 97.8 F (36.6 C)  SpO2: 93% 95%   Improved 7/8 9.  Post stroke dysphagia: Dysphagia #1 honey thick liquids.  Patient on IV fluids for hydration follow-up speech therapy             Advance diet as tolerated 10.  Hemorrhagic cystitis complicated by UTI.  Complete course of Ancef.  Follow-up outpatient urology 11.  Diabetes mellitus with peripheral neuropathy.  Hemoglobin A1c 8.9.  Levemir 10 units twice daily.  Check blood sugars  before meals and at bedtime CBG (last 3)  Recent Labs    04/16/20 1651 04/16/20 2115 04/17/20 0602  GLUCAP 180* 259* 122*  elevated 7/5, increase levemir to 12u bid, monitor effect , pm elevation will add am metformin 500mg  - monitor renal status , BMET in am               Monitor with increased mobility 12.  Hyperlipidemia.  Zetia/Pravacol 13.  Constipation.  MiraLAX. Adjust bowel meds as necessary    LOS: 6 days A FACE  TO FACE EVALUATION WAS PERFORMED  Erick Colace 04/17/2020, 8:32 AM

## 2020-04-18 ENCOUNTER — Inpatient Hospital Stay (HOSPITAL_COMMUNITY): Payer: Medicare Other | Admitting: Physical Therapy

## 2020-04-18 ENCOUNTER — Encounter (INDEPENDENT_AMBULATORY_CARE_PROVIDER_SITE_OTHER): Payer: Medicare Other | Admitting: Ophthalmology

## 2020-04-18 ENCOUNTER — Inpatient Hospital Stay (HOSPITAL_COMMUNITY): Payer: Medicare Other | Admitting: Speech Pathology

## 2020-04-18 ENCOUNTER — Inpatient Hospital Stay (HOSPITAL_COMMUNITY): Payer: Medicare Other | Admitting: Occupational Therapy

## 2020-04-18 LAB — GLUCOSE, CAPILLARY
Glucose-Capillary: 215 mg/dL — ABNORMAL HIGH (ref 70–99)
Glucose-Capillary: 178 mg/dL — ABNORMAL HIGH (ref 70–99)
Glucose-Capillary: 343 mg/dL — ABNORMAL HIGH (ref 70–99)
Glucose-Capillary: 263 mg/dL — ABNORMAL HIGH (ref 70–99)

## 2020-04-18 MED ORDER — INSULIN DETEMIR 100 UNIT/ML ~~LOC~~ SOLN
14.0000 [IU] | Freq: Two times a day (BID) | SUBCUTANEOUS | Status: DC
  Administered 2020-04-18 – 2020-04-20 (×4): 14 [IU] via SUBCUTANEOUS
  Filled 2020-04-18 (×6): qty 0.14

## 2020-04-18 NOTE — Progress Notes (Signed)
Physical Therapy Session Note  Patient Details  [name] MRN: [id number] Date of Birth: 12/18/1945  Today's Date: 04/18/2020 PT Individual Time: 805-900 and 1105-1135   55 min and 30 min   Short Term Goals: Week 1:  PT Short Term Goal 1 (Week 1): Pt will perform supine<>sit with max assist of 1 PT Short Term Goal 2 (Week 1): Pt will demonstrate ability to maintain static sitting EOB with close supervision for 5 minutes PT Short Term Goal 3 (Week 1): Pt will participate in sitting balance outcome measure PT Short Term Goal 4 (Week 1): Pt will perform bed<>chair transfers with +2 max assist  Skilled Therapeutic Interventions/Progress Updates:      Therapy Documentation  First session: Pt received in bed, pt directed in supine>sit EOB with max A x1 with cues for technique and time to allow pt to initiate movement. PT assisted pt in upper and lower body dressing with time given and verbal cues for pt to initiate and participate in weight shifting R and L as well as anterior weight shifting with mod A to complete and total assist for dressing for time management. Pt directed in sitting EOB for ~ 8 minutes with mod A-max A for achieving position and intermittent need of return to midline with verbal cues and manual facilitation. Pt directed in x5 anterior crunching with manual facilitation to achieve at mod A-max A. Pt directed in slide board transfer to wheelchair at max A x2 to complete with pt benefiting from manual facilitation with verbal cues for anterior weight shifting, technique, initiation, weight shifting to L and B foot placement; once in wheelchair pt also needed total assist to improve positioning at midline at head, shoulders, pelvis. Pt also directed in x3 attempts to sit>stand at hall rail on R with total assist for posture, B foot placement, midline positioning, and to attempt to limit pushing toward L with RUE. Pt ultimately unable to achieve full standing with pt limited  2/2 pushing, L inattention, and L UE and LE flexor hypertonicity. Pt returned to room and left with all needs in reach and alarm set, in wheelchair. Daughter present  Second Session: pt received in wheelchair with daughter present. Pt directed in slide board transfer to mat table in gym, taken in wheelchair for time management, at max A x2 to complete with max verbal cues for positioning of BLE and BUE, anterior trunk weight shifting and manual positioning of BLE throughout transfer. Pt also required increased time to allow time for her initiate movement and attempt to participate. Once EOB, pt directed in upright sitting at midline with mirror for visual feedback for ~15 mins total with frequent mod A to maintain and achieve midline at head, shoulders and pelvis. This improved with visual feedback. Once good sitting posture achieved, pt directed in reaching with RUE on right side x7 at various distances to increase weight shifting to R and limit L side lean, x5 reaching with RUE to L however this required much greater time to achieve cross midline. Pt also directed in R trunk lateral leans to elbow x4 to promote weight shifting and promote improved pt sense of midline in sitting. Pt then directed in slide board transfer to wheelchair at max A x2 with similar cues and returned to room. Left in wheelchair, with daughter present, alarm set, and all needs in reach.      Precautions:  Precautions Precautions: Fall, Other (comment) Precaution Comments: L hemi with hypertonia and L inattention Restrictions  Weight Bearing Restrictions: No    Vital Signs: Therapy Vitals Temp: 98.1 F (36.7 C) Temp Source: Oral Pulse Rate: 76 Resp: 16 BP: (!) 162/94 Patient Position (if appropriate): Lying Oxygen Therapy SpO2: 98 % O2 Device: Room Air Pain: Pain Assessment Pain Scale: Faces Pain Score: 0-No pain Faces Pain Scale: Hurts a little bit Pain Location: Leg Pain Orientation: Left    Therapy/Group:  Individual Therapy  Ignacia Marvel 04/18/2020, 9:01 AM     Grier Rocher PT, DPT  04/18/20 12:58 PM

## 2020-04-18 NOTE — Progress Notes (Signed)
Speech Language Pathology Weekly Progress and Session Note  Patient Details  [name] MRN: [id number] Date of Birth: 1946-02-11  Beginning of progress report period:  End of progress report period:   Today's Date: 04/18/2020 SLP Individual Time: 1350-1430 SLP Individual Time Calculation (min): 40 min  Short Term Goals: Week 1: SLP Short Term Goal 1 (Week 1): Pt will consume dys 1 textures and honey thick liquids with mod assist for use of swallowing strategies to mitigate left anterior spillage of boluses. SLP Short Term Goal 1 - Progress (Week 1): Met SLP Short Term Goal 2 (Week 1): Pt will consume therapeutic trials of dys 2 solids with mod assist cues for use of swallowing strategies to clear boluses from the oral cavity. SLP Short Term Goal 2 - Progress (Week 1): Met SLP Short Term Goal 3 (Week 1): Pt will consume therapeutic trials of thin liquids over 3 consecutive sessions with out respiratory decompensation prior to repeat MBS. SLP Short Term Goal 3 - Progress (Week 1): Progressing toward goal SLP Short Term Goal 4 (Week 1): Pt sill sustain her attention to basic familiar tasks for 3 minute intervals with mod assist multimodal cues for redirection. SLP Short Term Goal 4 - Progress (Week 1): Met SLP Short Term Goal 5 (Week 1): Pt will complete basic, familiar tasks with max assist  multimodal cues for functional problem solving. SLP Short Term Goal 5 - Progress (Week 1): Met SLP Short Term Goal 6 (Week 1): Pt will locate items to the left of midline during basic, familiar tasks with max assist multimodal cues. SLP Short Term Goal 6 - Progress (Week 1): Met    New Short Term Goals: Week 2: SLP Short Term Goal 1 (Week 2): Pt will consume dys 1 textures and honey thick liquids with min assist for use of swallowing strategies to mitigate left anterior spillage of boluses. SLP Short Term Goal 2 (Week 2): Pt will consume therapeutic trials of dys 2 solids with min assist  cues for use of swallowing strategies to clear boluses from the oral cavity. SLP Short Term Goal 3 (Week 2): Pt will consume therapeutic trials of thin liquids over 3 consecutive sessions with out respiratory decompensation prior to repeat MBS. SLP Short Term Goal 4 (Week 2): Pt sill sustain her attention to basic familiar tasks for 3 minute intervals with min assist multimodal cues for redirection. SLP Short Term Goal 5 (Week 2): Pt will complete basic, familiar tasks with mod assist  multimodal cues for functional problem solving. SLP Short Term Goal 6 (Week 2): Pt will locate items to the left of midline during basic, familiar tasks with mod assist multimodal cues.  Weekly Progress Updates:   Pt has made functional gains this reporting period and has met 5 out of 6 short term goals.  Pt is currently mod-max assist for tasks due to significant cognitive deficits post R CVA.  Pt has demonstrated improved bolus manipulation and containment, sustained attention to tasks, functional problem solving, and visual scanning.  Pt is consuming dys 1, honey thick liquids diet with mod cues for use of swallowing precautions.  Pt and family education is ongoing.  Pt would continue to benefit from skilled ST while inpatient in order to maximize functional independence and reduce burden of care prior to discharge.  Anticipate that pt will need 24/7 supervision at discharge in addition to [facility] follow up at next level of care    Intensity: Minumum of 1-2 x/day, 30 to  90 minutes Frequency: 3 to 5 out of 7 days Duration/Length of Stay: 21-28 days Treatment/Interventions: Cognitive remediation/compensation;Cueing hierarchy;Environmental controls;Dysphagia/aspiration precaution training;Internal/external aids;Speech/Language facilitation;Patient/family education   Daily Session  Skilled Therapeutic Interventions: Pt was seen for skilled ST targeting dysphagia goals.  Therapist facilitated the session with therapeutic  trials of thin liquids to continue working towards repeat instrumental assessment of swallowing function.  Pt had x1 delayed cough with thin liquids but no other overt s/s of aspiration were evident with thin liquids.  Pt remains afebrile and her O2 sats have been [facility] while maintained on room air.  I would suggest continuing trials of thin liquids with the hopes of a repeat MBS early to middle of next week.  Pt also consumed a Nutrigrain granola bar with mod cues to clear advanced solids from the oral cavity.  No overt s/s of aspiration were evident with solids and her oral phase appeared more timely than on initial evaluation.  Goals updated on this date to reflect current progress and goals of care.  Pt was left in wheelchair with chair alarm set and family at bedside.      General    Pain Pain Assessment Pain Scale: 0-10 Pain Score: 0-No pain  Therapy/Group: Individual Therapy  Vester Titsworth, Selinda Orion 04/18/2020, 3:49 PM

## 2020-04-18 NOTE — Progress Notes (Signed)
[facility] [place] [facility] PHYSICAL MEDICINE & REHABILITATION PROGRESS NOTE   Subjective/Complaints:  Alert today, ate 50% of breakfast today. Working with PT at bedside poor sitting balance needs max assist x2 for lower body dressing  ROS: limited d/t cognition     Objective:   No results found. No results for input(s): WBC, HGB, HCT, PLT in the last 72 hours. No results for input(s): NA, K, CL, CO2, GLUCOSE, BUN, CREATININE, CALCIUM in the last 72 hours.  Intake/Output Summary (Last 24 hours) at 04/18/2020 0952 Last data filed at 04/18/2020 0600 Gross per 24 hour  Intake 560 ml  Output 1100 ml  Net -540 ml     Physical Exam: Vital Signs Blood pressure (!) 162/94, pulse 76, temperature 98.1 F (36.7 C), temperature source Oral, resp. rate 16, height 5\' 7"  (1.702 m), weight 74.1 kg, SpO2 98 %.   General: No acute distress Mood and affect are appropriate Heart: Regular rate and rhythm no rubs murmurs or extra sounds Lungs: Clear to auscultation, breathing unlabored, no rales or wheezes Abdomen: Positive bowel sounds, soft nontender to palpation, nondistended Extremities: No clubbing, cyanosis, or edema Skin: No evidence of breakdown, no evidence of rash   Musculoskeletal: Full range of motion in all 4 extremities. No joint swelling  Skin: loop site without bleeding today- moderate ecchymosis and small hematoma Neurologic: Left central 7. Decreased oro-motor control. motor strength is 5/5 in right and 0/5 left deltoid, bicep, tricep, grip, hip flexor, knee extensors, ankle dorsiflexor and plantar flexor Sensory exam reduced pinch on left, left inattention.   Musculoskeletal: Full range of motion in all 4 extremities. No joint swelling     Assessment/Plan: 1. 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  which require 3+ hours per day of interdisciplinary therapy in a comprehensive inpatient rehab setting.  Physiatrist is providing close team supervision and 24 hour management of  active medical problems listed below.  Physiatrist and rehab team continue to assess barriers to discharge/monitor patient progress toward functional and medical goals  Care Tool:  Bathing    Body parts bathed by patient: Abdomen, Face, Chest   Body parts bathed by helper: Right arm, Left arm, Front perineal area, Buttocks, Right upper leg, Left upper leg, Right lower leg, Left lower leg     Bathing assist Assist Level: Total Assistance - Patient < 25%     Upper Body Dressing/Undressing Upper body dressing   What is the patient wearing?: Dress    Upper body assist Assist Level: 2 Helpers    Lower Body Dressing/Undressing Lower body dressing      What is the patient wearing?: Incontinence brief     Lower body assist Assist for lower body dressing: Dependent - Patient 0%     Toileting Toileting Toileting Activity did not occur (Clothing management and hygiene only): N/A (no void or bm)  Toileting assist Assist for toileting: Dependent - Patient 0%     Transfers Chair/bed transfer  Transfers assist  Chair/bed transfer activity did not occur: Safety/medical concerns  Chair/bed transfer assist level: 2 Helpers     Locomotion Ambulation   Ambulation assist   Ambulation activity did not occur: Safety/medical concerns          Walk 10 feet activity   Assist  Walk 10 feet activity did not occur: Safety/medical concerns        Walk 50 feet activity   Assist Walk 50 feet with 2 turns activity did not occur: Safety/medical concerns  Walk 150 feet activity   Assist Walk 150 feet activity did not occur: Safety/medical concerns         Walk 10 feet on uneven surface  activity   Assist Walk 10 feet on uneven surfaces activity did not occur: Safety/medical concerns         Wheelchair     Assist Will patient use wheelchair at discharge?:  (TBD)             Wheelchair 50 feet with 2 turns activity    Assist             Wheelchair 150 feet activity     Assist          Blood pressure (!) 162/94, pulse 76, temperature 98.1 F (36.7 C), temperature source Oral, resp. rate 16, height 5\' 7"  (1.702 m), weight 74.1 kg, SpO2 98 %.  Medical Problem List and Plan: 1.  Left side weakness with dysarthria and dysphagia secondary to right MCA infarction status post TPA and IR with TIC13 revascularization reoccluded on follow-up, infarct embolic secondary to unknown source.  Status post loop recorder             -patientmay shower             -ELOS/Goals: 7-28 min/modA goals             --Continue CIR therapies including PT, OT, and SLP - 2.  Antithrombotics: -DVT/anticoagulation: Subcutaneous heparin             -antiplatelet therapy: Aspirin 81 mg daily 3. Pain Management: Tylenol as needed             Monitor for headaches with increased activity. 4. Mood: Amantadine 150 mg daily for stimulation, tremors may be side effects will D/C             -antipsychotic agents: N/A 5. Neuropsych: This patient is not capable of making decisions on her own behalf. 6. Skin/Wound Care: Routine skin checks, some bleeding from loop site but no hematoma 7. Fluids/Electrolytes/Nutrition: Routine in and outs. CMP ordered.  HypoK, will supplement  8.  Hypertension.  Lopressor 25 mg twice daily, Norvasc 10 mg daily.           -bp's higher in the early am before meds.   Vitals:   04/17/20 2029 04/18/20 0539  BP: 135/79 (!) 162/94  Pulse: 78 76  Resp: 16 16  Temp: (!) 97.5 F (36.4 C) 98.1 F (36.7 C)  SpO2: 98% 98%  Labile 7/9 will monitor 9.  Post stroke dysphagia: Dysphagia #1 honey thick liquids.  Patient on IV fluids for hydration follow-up speech therapy             Advance diet as tolerated 10.  Hemorrhagic cystitis complicated by UTI.  Complete course of Ancef.  Follow-up outpatient urology 11.  Diabetes mellitus with peripheral neuropathy.  Hemoglobin A1c 8.9.  Levemir 10 units twice daily.  Check blood  sugars before meals and at bedtime CBG (last 3)  Recent Labs    04/17/20 1714 04/17/20 2057 04/18/20 0603  GLUCAP 196* 255* 215*  Increase Levemir to 14 units twice daily              Monitor with increased mobility 12.  Hyperlipidemia.  Zetia/Pravacol 13.  Constipation.  MiraLAX. Adjust bowel meds as necessary    LOS: 7 days A FACE TO FACE EVALUATION WAS PERFORMED  06/19/20 04/18/2020, 9:52 AM

## 2020-04-18 NOTE — Progress Notes (Signed)
Occupational Therapy Session Note  Patient Details  [name] MRN: [id number] Date of Birth: 19-Aug-1946  Today's Date: 04/18/2020 OT Individual Time: 1450-1533 OT Individual Time Calculation (min): 43 min   Short Term Goals: Week 1:  OT Short Term Goal 1 (Week 1): Pt will sit EOB/EOM for 5 min with MOD A of 1 for sitting balance during functional activity OT Short Term Goal 2 (Week 1): Pt will terminate washing face with no VC OT Short Term Goal 3 (Week 1): Pt will locate 2/2 grooming items on L side of sink wiht min VC OT Short Term Goal 4 (Week 1): Pt will complete 1/4 steps of donning shirt with multimodal cuing OT Short Term Goal 5 (Week 1): Pt will transfer to w/c with MAX A of 2 in prep for [facility] transfer  Skilled Therapeutic Interventions/Progress Updates:    Pt greeted in the TIS with dtr [name] present. Pt c/o discomfort from Rt LE spasms but otherwise felt fine. +2 for sit<stand in [place] while 3rd helper lowered paddles. Pt then transferred to the [facility] where she voided bowels. Note that pt needed Min A for sitting balance during void. Heavy +3 assist for sit<stand in [place] due to Lt pushing and pt pushing [place] away vs pulling herself up this time. Hygiene and brief change were completed bedlevel with 2 helpers, pt rolling Rt>Lt with Max A of 1. At end of session pt remained in care of RN for cath. Tx focus placed on ADL retraining, functional transfers, sitting balance, and supported standing balance.   Therapy Documentation Precautions:  Precautions Precautions: Fall, Other (comment) Precaution Comments: L hemi with hypertonia and L inattention Restrictions Weight Bearing Restrictions: No Vital Signs: Therapy Vitals Temp: 97.9 F (36.6 C) Pulse Rate: 72 Resp: 16 BP: 126/80 Patient Position (if appropriate): Sitting Oxygen Therapy SpO2: 100 % O2 Device: Room Air Pain: Pain Assessment Pain Scale: 0-10 Pain Score: 0-No pain ADL:       Therapy/Group:  Individual Therapy  Kavian Peters A Aleta Manternach 04/18/2020, 3:54 PM

## 2020-04-19 DIAGNOSIS — I69391 Dysphagia following cerebral infarction: Secondary | ICD-10-CM

## 2020-04-19 DIAGNOSIS — I6601 Occlusion and stenosis of right middle cerebral artery: Secondary | ICD-10-CM

## 2020-04-19 DIAGNOSIS — R0989 Other specified symptoms and signs involving the circulatory and respiratory systems: Secondary | ICD-10-CM

## 2020-04-19 DIAGNOSIS — E1165 Type 2 diabetes mellitus with hyperglycemia: Secondary | ICD-10-CM

## 2020-04-19 LAB — GLUCOSE, CAPILLARY
Glucose-Capillary: 215 mg/dL — ABNORMAL HIGH (ref 70–99)
Glucose-Capillary: 219 mg/dL — ABNORMAL HIGH (ref 70–99)
Glucose-Capillary: 202 mg/dL — ABNORMAL HIGH (ref 70–99)
Glucose-Capillary: 226 mg/dL — ABNORMAL HIGH (ref 70–99)

## 2020-04-19 MED ORDER — LISINOPRIL 2.5 MG PO TABS
2.5000 mg | ORAL_TABLET | Freq: Every day | ORAL | Status: DC
  Administered 2020-04-19 – 2020-04-24 (×6): 2.5 mg via ORAL
  Filled 2020-04-19 (×6): qty 1

## 2020-04-19 NOTE — Progress Notes (Signed)
[facility] PHYSICAL MEDICINE & REHABILITATION PROGRESS NOTE   Subjective/Complaints: Patient seen laying in bed this morning.  She states she slept better overnight.  No reported issues overnight.  ROS: limited due to cognition.  Objective:   No results found. No results for input(s): WBC, HGB, HCT, PLT in the last 72 hours. No results for input(s): NA, K, CL, CO2, GLUCOSE, BUN, CREATININE, CALCIUM in the last 72 hours.  Intake/Output Summary (Last 24 hours) at 04/19/2020 1522 Last data filed at 04/19/2020 0853 Gross per 24 hour  Intake 230 ml  Output 400 ml  Net -170 ml     Physical Exam: Vital Signs Blood pressure (!) 171/91, pulse 80, temperature 98.2 F (36.8 C), temperature source Oral, resp. rate 18, height 5\' 7"  (1.702 m), weight 77.1 kg, SpO2 100 %. Constitutional: No distress . Vital signs reviewed. HENT: Normocephalic.  Atraumatic. Eyes: EOMI. No discharge. Cardiovascular: No JVD.  RRR. Respiratory: Normal effort.  No stridor.  Bilaterally clear to auscultation. GI: Non-distended.  BS +. Skin: Left side with dressing C/D/I Psych: Normal mood.  Normal behavior. Musc: No edema in extremities.  No tenderness in extremities. Neurologic: Alert Motor: Left facial weakness.  Motor: LUE/LLE: 0/5 proximal distal  ?  Volitional versus involuntary low-frequency rhythmic jerks of right side  Assessment/Plan: 1. 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  which require 3+ hours per day of interdisciplinary therapy in a comprehensive inpatient rehab setting.  Physiatrist is providing close team supervision and 24 hour management of active medical problems listed below.  Physiatrist and rehab team continue to assess barriers to discharge/monitor patient progress toward functional and medical goals  Care Tool:  Bathing    Body parts bathed by patient: Abdomen, Face, Chest   Body parts bathed by helper: Right arm, Left arm, Front perineal area, Buttocks, Right  upper leg, Left upper leg, Right lower leg, Left lower leg     Bathing assist Assist Level: Total Assistance - Patient < 25%     Upper Body Dressing/Undressing Upper body dressing   What is the patient wearing?: Dress    Upper body assist Assist Level: 2 Helpers    Lower Body Dressing/Undressing Lower body dressing      What is the patient wearing?: Incontinence brief     Lower body assist Assist for lower body dressing: Dependent - Patient 0%     Toileting Toileting Toileting Activity did not occur (Clothing management and hygiene only): N/A (no void or bm)  Toileting assist Assist for toileting: Dependent - Patient 0%     Transfers Chair/bed transfer  Transfers assist  Chair/bed transfer activity did not occur: Safety/medical concerns  Chair/bed transfer assist level: 2 Helpers     Locomotion Ambulation   Ambulation assist   Ambulation activity did not occur: Safety/medical concerns          Walk 10 feet activity   Assist  Walk 10 feet activity did not occur: Safety/medical concerns        Walk 50 feet activity   Assist Walk 50 feet with 2 turns activity did not occur: Safety/medical concerns         Walk 150 feet activity   Assist Walk 150 feet activity did not occur: Safety/medical concerns         Walk 10 feet on uneven surface  activity   Assist Walk 10 feet on uneven surfaces activity did not occur: Safety/medical concerns         Wheelchair  Assist Will patient use wheelchair at discharge?:  (TBD)             Wheelchair 50 feet with 2 turns activity    Assist            Wheelchair 150 feet activity     Assist          Blood pressure (!) 171/91, pulse 80, temperature 98.2 F (36.8 C), temperature source Oral, resp. rate 18, height 5\' 7"  (1.702 m), weight 77.1 kg, SpO2 100 %.  Medical Problem List and Plan: 1.  Left side weakness with dysarthria and dysphagia secondary to right MCA  infarction status post TPA and IR with TIC13 revascularization reoccluded on follow-up, infarct embolic secondary to unknown source.  Status post loop recorder  Continue CIR  2.  Antithrombotics: -DVT/anticoagulation: Subcutaneous heparin             -antiplatelet therapy: Aspirin 81 mg daily 3. Pain Management: Tylenol as needed             Monitor for headaches with increased activity. 4. Mood: Amantadine 150 mg daily for stimulation, tremors may be side effects D/Ced             -antipsychotic agents: N/A 5. Neuropsych: This patient is not capable of making decisions on her own behalf. 6. Skin/Wound Care: Routine skin checks 7. Fluids/Electrolytes/Nutrition: Routine in and outs.  8.  Hypertension.  Lopressor 25 mg twice daily, Norvasc 10 mg daily.    Vitals:   04/18/20 1928 04/19/20 0527  BP: (!) 150/80 (!) 171/91  Pulse: 89 80  Resp: 18 18  Temp: 98.2 F (36.8 C) 98.2 F (36.8 C)  SpO2: 97% 100%   Lisinopril 2.5 nightly started on 7/10  Labile, but elevated on 7/10 9.  Post stroke dysphagia: Dysphagia #1 honey thick liquids.  Patient on IV fluids for hydration follow-up speech therapy             Advance diet as tolerated 10.  Hemorrhagic cystitis complicated by UTI.  Complete course of Ancef.  Follow-up outpatient urology 11.  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  Hemoglobin A1c 8.9.  Check blood sugars before meals and at bedtime CBG (last 3)  Recent Labs    04/18/20 2040 04/19/20 0601 04/19/20 1217  GLUCAP 263* 215* 219*   Metformin 500 daily  Increase Levemir to 14 units twice daily on 7/9  Remains elevated on 7/10, will consider further increase tomorrow if persistent             Monitor with increased mobility 12.  Hyperlipidemia.  Zetia/Pravacol 13.  Constipation.  MiraLAX. Adjust bowel meds as necessary   LOS: 8 days A FACE TO FACE EVALUATION WAS PERFORMED  Sussie Minor 9/10 04/19/2020, 3:22 PM

## 2020-04-20 ENCOUNTER — Inpatient Hospital Stay (HOSPITAL_COMMUNITY): Payer: Medicare Other | Admitting: Speech Pathology

## 2020-04-20 ENCOUNTER — Inpatient Hospital Stay (HOSPITAL_COMMUNITY): Payer: Medicare Other

## 2020-04-20 ENCOUNTER — Inpatient Hospital Stay (HOSPITAL_COMMUNITY): Payer: Medicare Other | Admitting: Occupational Therapy

## 2020-04-20 DIAGNOSIS — G8194 Hemiplegia, unspecified affecting left nondominant side: Secondary | ICD-10-CM

## 2020-04-20 DIAGNOSIS — G8110 Spastic hemiplegia affecting unspecified side: Secondary | ICD-10-CM

## 2020-04-20 LAB — GLUCOSE, CAPILLARY
Glucose-Capillary: 190 mg/dL — ABNORMAL HIGH (ref 70–99)
Glucose-Capillary: 250 mg/dL — ABNORMAL HIGH (ref 70–99)
Glucose-Capillary: 160 mg/dL — ABNORMAL HIGH (ref 70–99)
Glucose-Capillary: 280 mg/dL — ABNORMAL HIGH (ref 70–99)

## 2020-04-20 MED ORDER — BACLOFEN 5 MG HALF TABLET
5.0000 mg | ORAL_TABLET | Freq: Two times a day (BID) | ORAL | Status: DC
  Administered 2020-04-20 – 2020-04-21 (×2): 5 mg via ORAL
  Filled 2020-04-20 (×2): qty 1

## 2020-04-20 MED ORDER — INSULIN DETEMIR 100 UNIT/ML ~~LOC~~ SOLN
18.0000 [IU] | Freq: Two times a day (BID) | SUBCUTANEOUS | Status: DC
  Administered 2020-04-20 – 2020-04-21 (×2): 18 [IU] via SUBCUTANEOUS
  Filled 2020-04-20 (×3): qty 0.18

## 2020-04-20 NOTE — Progress Notes (Signed)
Occupational Therapy Session Note  Patient Details  [name] MRN: [id number] Date of Birth: 22-Jun-1946  Today's Date: 04/20/2020 OT Individual Time: 1002-1059 OT Individual Time Calculation (min): 57 min    Short Term Goals: Week 1:  OT Short Term Goal 1 (Week 1): Pt will sit EOB/EOM for 5 min with MOD A of 1 for sitting balance during functional activity OT Short Term Goal 2 (Week 1): Pt will terminate washing face with no VC OT Short Term Goal 3 (Week 1): Pt will locate 2/2 grooming items on L side of sink wiht min VC OT Short Term Goal 4 (Week 1): Pt will complete 1/4 steps of donning shirt with multimodal cuing OT Short Term Goal 5 (Week 1): Pt will transfer to w/c with MAX A of 2 in prep for [facility] transfer  Skilled Therapeutic Interventions/Progress Updates:    Upon entering the room, pt supine in bed with c/o pain in L LE. RN notified. Pt agreeable to OT intervention. Pt's rolling L <> R with max A and max multimodal cuing for technique and hand placement. Total A to don pull over pants, TED hose, and B socks. Supine >sit with total A to EOB. Pt needing max - total A for static sitting balance EOB. Pt's feet placed on step for transfer and slide board placed with +2 assistance. Total A +2 slide board transfer with bobath technique in order to decrease pushing. UB self care performed at sink with hemiplegic dressing technique. Pt did initiate small anterior weight shifts when given cues and increased time in order to pull shirt down trunk. Pt performing oral care at sink with regular toothebrush and therapist assisted with suction for safety. OT holding dentures while pt attempting to place fixodent with mod A. OT placing heat to pt's neck and shoulders as she also reports soreness in these areas. Pt combing portion of hair with mirror for visual feedback while therapist monitored heat. Heat removed at end of session with no adverse affects. Pt remained in tilt in space wheelchair  with chair alarm belt donned and call bell within reach.  Therapy Documentation Precautions:  Precautions Precautions: Fall, Other (comment) Precaution Comments: L hemi with hypertonia and L inattention Restrictions Weight Bearing Restrictions: No   Pain: Pain Assessment Pain Scale: 0-10 Pain Score: 0-No pain Faces Pain Scale: No hurt    Therapy/Group: Individual Therapy  Alen Bleacher 04/20/2020, 12:50 PM

## 2020-04-20 NOTE — Progress Notes (Signed)
Orthopedic Tech Progress Note Patient Details:  Meredith Shaw 1946-02-04 [id number] Ordered outside vendor brace Patient ID: [name], female   DOB: 11-29-1945, 74 y.o.   MRN: [id number]   [name] 04/20/2020, 3:30 PM

## 2020-04-20 NOTE — Progress Notes (Signed)
Speech Language Pathology Daily Session Note  Patient Details  [name] MRN: [id number] Date of Birth: January 15, 1946  Today's Date: 04/20/2020 SLP Individual Time: 7893-8101 SLP Individual Time Calculation (min): 26 min  Short Term Goals: Week 2: SLP Short Term Goal 1 (Week 2): Pt will consume dys 1 textures and honey thick liquids with min assist for use of swallowing strategies to mitigate left anterior spillage of boluses. SLP Short Term Goal 2 (Week 2): Pt will consume therapeutic trials of dys 2 solids with min assist cues for use of swallowing strategies to clear boluses from the oral cavity. SLP Short Term Goal 3 (Week 2): Pt will consume therapeutic trials of thin liquids over 3 consecutive sessions with out respiratory decompensation prior to repeat MBS. SLP Short Term Goal 4 (Week 2): Pt sill sustain her attention to basic familiar tasks for 3 minute intervals with min assist multimodal cues for redirection. SLP Short Term Goal 5 (Week 2): Pt will complete basic, familiar tasks with mod assist  multimodal cues for functional problem solving. SLP Short Term Goal 6 (Week 2): Pt will locate items to the left of midline during basic, familiar tasks with mod assist multimodal cues.  Skilled Therapeutic Interventions:  Pt was seen for skilled ST targeting cognitive goals.  Pt was very lethargic upon therapist's arrival and as a result therapist took pt to SLP treatment room in order to maximize alertness for participation.  In treatment room pt remained lethargic, even when her favorite music was played at loud volume and therapist tapped loudly on table.  Pt did spontaneously awaken 2-3 times throughout therapy session for sufficient participation in a structured calendar making task for 1-2 minute intervals.  Pt required max to total assist to place number cards in sequential order in a large print wall calendar due to fleeting attention to task and significant difficulty scanning  past midline.  When pt was returned to her room, her daughter, son, and husband were all present and seated to her left.  Pt required max assist multimodal cues from all family members and therapist to scan to midline in order to see her family.  Pt was left in wheelchair with family at bedside and chair alarm set.  Continue per current plan of care.    Pain Pain Assessment Pain Scale: 0-10 Pain Score: 0-No pain Faces Pain Scale: No hurt  Therapy/Group: Individual Therapy  [name], Melanee Spry 04/20/2020, 2:50 PM

## 2020-04-20 NOTE — Progress Notes (Addendum)
[facility] PHYSICAL MEDICINE & REHABILITATION PROGRESS NOTE   Subjective/Complaints: Patient seen laying in bed this AM.  She states she slept well overnight.  No reported issues overnight.  Discussed jerking movements with nursing.   ROS: limited due to cognition.   Objective:   No results found. No results for input(s): WBC, HGB, HCT, PLT in the last 72 hours. No results for input(s): NA, K, CL, CO2, GLUCOSE, BUN, CREATININE, CALCIUM in the last 72 hours.  Intake/Output Summary (Last 24 hours) at 04/20/2020 1411 Last data filed at 04/20/2020 1300 Gross per 24 hour  Intake 236 ml  Output 950 ml  Net -714 ml     Physical Exam: Vital Signs Blood pressure 121/77, pulse 80, temperature (!) 97.5 F (36.4 C), temperature source Oral, resp. rate 18, height 5\' 7"  (1.702 m), weight 76.1 kg, SpO2 93 %. Constitutional: No distress . Vital signs reviewed. HENT: Normocephalic.  Atraumatic. Eyes: EOMI. No discharge. Cardiovascular: No JVD. RRR.  Respiratory: Normal effort.  No stridor. Bilaterally clear to auscultation.  GI: Non-distended. BS+. Skin: Warm and dry.  Intact. Psych: Normal mood.  Normal behavior. Musc: No edema in extremities.  No tenderness in extremities. Neurologic: Alert Motor: Left facial weakness.  Motor: LUE/LLE: 0/5 proximal distal  Increased tone LUE Low-frequency rhythmic jerks of right side  Assessment/Plan: 1. 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  which require 3+ hours per day of interdisciplinary therapy in a comprehensive inpatient rehab setting.  Physiatrist is providing close team supervision and 24 hour management of active medical problems listed below.  Physiatrist and rehab team continue to assess barriers to discharge/monitor patient progress toward functional and medical goals  Care Tool:  Bathing    Body parts bathed by patient: Abdomen, Face, Chest   Body parts bathed by helper: Right arm, Left arm, Front perineal area,  Buttocks, Right upper leg, Left upper leg, Right lower leg, Left lower leg     Bathing assist Assist Level: Total Assistance - Patient < 25%     Upper Body Dressing/Undressing Upper body dressing   What is the patient wearing?: Pull over shirt    Upper body assist Assist Level: Dependent - Patient 0%    Lower Body Dressing/Undressing Lower body dressing      What is the patient wearing?: Pants     Lower body assist Assist for lower body dressing: 2 Helpers     Toileting Toileting Toileting Activity did not occur (Clothing management and hygiene only): N/A (no void or bm)  Toileting assist Assist for toileting: Dependent - Patient 0%     Transfers Chair/bed transfer  Transfers assist  Chair/bed transfer activity did not occur: Safety/medical concerns  Chair/bed transfer assist level: 2 Helpers     Locomotion Ambulation   Ambulation assist   Ambulation activity did not occur: Safety/medical concerns          Walk 10 feet activity   Assist  Walk 10 feet activity did not occur: Safety/medical concerns        Walk 50 feet activity   Assist Walk 50 feet with 2 turns activity did not occur: Safety/medical concerns         Walk 150 feet activity   Assist Walk 150 feet activity did not occur: Safety/medical concerns         Walk 10 feet on uneven surface  activity   Assist Walk 10 feet on uneven surfaces activity did not occur: Safety/medical concerns  Wheelchair     Assist Will patient use wheelchair at discharge?:  (TBD)             Wheelchair 50 feet with 2 turns activity    Assist            Wheelchair 150 feet activity     Assist          Blood pressure 121/77, pulse 80, temperature (!) 97.5 F (36.4 C), temperature source Oral, resp. rate 18, height 5\' 7"  (1.702 m), weight 76.1 kg, SpO2 93 %.  Medical Problem List and Plan: 1.  Left side hemiplegia, now with spasticity, with dysarthria  and dysphagia secondary to right MCA infarction status post TPA and IR with TIC13 revascularization reoccluded on follow-up, infarct embolic secondary to unknown source.  Status post loop recorder  Continue CIR   WHO ordered  Baclofen 5 BID started on 7/11 2.  Antithrombotics: -DVT/anticoagulation: Subcutaneous heparin             -antiplatelet therapy: Aspirin 81 mg daily 3. Pain Management: Tylenol as needed             Monitor for headaches with increased activity. 4. Mood: Amantadine 150 mg daily for stimulation, tremors may be side effects D/Ced             -antipsychotic agents: N/A 5. Neuropsych: This patient is not capable of making decisions on her own behalf. 6. Skin/Wound Care: Routine skin checks 7. Fluids/Electrolytes/Nutrition: Routine in and outs.  8.  Hypertension.  Lopressor 25 mg twice daily, Norvasc 10 mg daily.    Vitals:   04/19/20 2125 04/20/20 0430  BP: (!) 159/76 121/77  Pulse:  80  Resp:  18  Temp:  (!) 97.5 F (36.4 C)  SpO2:  93%   Lisinopril 2.5 nightly started on 7/10  Labile, but ?improving on 7/11 9.  Post stroke dysphagia: Dysphagia #1 honey thick liquids.  Patient on IV fluids for hydration follow-up speech therapy             Advance diet as tolerated 10.  Hemorrhagic cystitis complicated by UTI.  Complete course of Ancef.  Follow-up outpatient urology 11.  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  Hemoglobin A1c 8.9.  Check blood sugars before meals and at bedtime CBG (last 3)  Recent Labs    04/19/20 1958 04/20/20 0621 04/20/20 1146  GLUCAP 226* 190* 250*   Metformin 500 daily  Increase Levemir to 14 units twice daily on 7/9, increased to 18 BID on 7/11  Remains elevated on 7/10, will consider further increase tomorrow if persistent             Monitor with increased mobility 12.  Hyperlipidemia.  Zetia/Pravacol 13.  Constipation. MiraLAX. Adjust bowel meds as necessary   LOS: 9 days A FACE TO FACE EVALUATION WAS  PERFORMED  Khiry Pasquariello 9/10 04/20/2020, 2:11 PM

## 2020-04-20 NOTE — Progress Notes (Signed)
Physical Therapy Session Note  Patient Details  [name] MRN: [id number] Date of Birth: [birth year]  Today's Date: 04/20/2020 PT Individual Time: 0806-0900 PT Individual Time Calculation (min): 54 min   Short Term Goals: Week 1:  PT Short Term Goal 1 (Week 1): Pt will perform supine<>sit with max assist of 1 PT Short Term Goal 2 (Week 1): Pt will demonstrate ability to maintain static sitting EOB with close supervision for 5 minutes PT Short Term Goal 3 (Week 1): Pt will participate in sitting balance outcome measure PT Short Term Goal 4 (Week 1): Pt will perform bed<>chair transfers with +2 max assist  Skilled Therapeutic Interventions/Progress Updates: Pt presented in bed with NT present eating breakfast. PTA completed supervision of breakfast with pt with PTA placing bed in chair position for improved positioning. PTA also turned off TV for improved attention to task with pt requiring mod cues for completion of task. Pt was unable to scan to L with max multimodal cues when attempting to eat however was able to scan to midline to eat with max cues and increased time, thus this task was performed during therapy session as scanning task. Pt noted to have some difficulty using towel to clean face this session due to spasms on R side however when pt was asked to reach for object for clean face with washcloth, pt was able to perform task without spams. PTA repositioned pt and placed pillows under L arm and RLE. Pt remained in bed once meal completed and left with bed alarm on, call bell within reach and needs met.      Therapy Documentation Precautions:  Precautions Precautions: Fall, Other (comment) Precaution Comments: L hemi with hypertonia and L inattention Restrictions Weight Bearing Restrictions: No General:   Vital Signs: Therapy Vitals Temp: 98.3 F (36.8 C) Pulse Rate: 74 Resp: 14 BP: 127/74 Patient Position (if appropriate): Lying Oxygen Therapy SpO2: 99 % O2  Device: Room Air Pain: Pain Assessment Pain Scale: 0-10 Pain Score: 0-No pain Mobility:   Locomotion :    Trunk/Postural Assessment :    Balance:   Exercises:   Other Treatments:      Therapy/Group: Individual Therapy  Georgana Romain 04/20/2020, 4:29 PM

## 2020-04-20 NOTE — Progress Notes (Signed)
Physical Therapy Weekly Progress Note  Patient Details  [name] MRN: [id number] Date of Birth: 10/29/1945  Beginning of progress report period: April 12, 2020 End of progress report period: April 20, 2020  Today's Date: 04/21/2020      Patient has met 1 of 4 short term goals.  Pt is making slower than anticipated gains this current course of therapy. Pt continues to require mod A for sitting balance due to heavy posterior lean, decreased sustained attention and pusher's syndrome. Pt has initiated SB transfers however requires +2 assist due to decreased initiation, poor sitting balance, and pushing tendencies. Pt also has L hypertonia which has limited pt's standing attempts requiring max to total x2 assistance.   Patient continues to demonstrate the following deficits muscle weakness, muscle joint tightness and muscle paralysis, impaired timing and sequencing, abnormal tone, unbalanced muscle activation, motor apraxia, decreased coordination and decreased motor planning and decreased initiation, decreased attention, decreased awareness, decreased problem solving and decreased safety awareness and therefore will continue to benefit from skilled PT intervention to increase functional independence with mobility.  Patient pt is currently making minimal gains with therapy secondary to medical status and remaining physical deficits. .  Continue plan of care.Will monitor this week if LTG need to be adjusted.   PT Short Term Goals Week 1:  PT Short Term Goal 1 (Week 1): Pt will perform supine<>sit with max assist of 1 PT Short Term Goal 1 - Progress (Week 1): Not met PT Short Term Goal 2 (Week 1): Pt will demonstrate ability to maintain static sitting EOB with close supervision for 5 minutes PT Short Term Goal 2 - Progress (Week 1): Not met PT Short Term Goal 3 (Week 1): Pt will participate in sitting balance outcome measure PT Short Term Goal 3 - Progress (Week 1): Not met PT Short Term  Goal 4 (Week 1): Pt will perform bed<>chair transfers with +2 max assist PT Short Term Goal 4 - Progress (Week 1): Met Week 2:  PT Short Term Goal 1 (Week 2): Pt will demonstrate ability to maintain static sitting EOB with close supervision for 5 minutes PT Short Term Goal 2 (Week 2): Pt will perform supine<>sit with max assist of 1 PT Short Term Goal 3 (Week 2): pt to demonstrate transfer to/from bed<>chair at mod A x1  Skilled Therapeutic Interventions/Progress Updates:  Ambulation/gait training;DME/adaptive equipment instruction;Community reintegration;Neuromuscular re-education;Psychosocial support;Stair training;UE/LE Strength taining/ROM;Wheelchair propulsion/positioning;Balance/vestibular training;Discharge planning;Functional electrical stimulation;Pain management;Skin care/wound management;Therapeutic Activities;UE/LE Coordination activities;Cognitive remediation/compensation;Disease management/prevention;Functional mobility training;Patient/family education;Splinting/orthotics;Therapeutic Exercise;Visual/perceptual remediation/compensation   Therapy Documentation Precautions:  Precautions Precautions: Fall, Other (comment) Precaution Comments: L hemi with hypertonia and L inattention Restrictions Weight Bearing Restrictions: No       Junie Panning 04/21/2020, 3:37 PM

## 2020-04-21 ENCOUNTER — Inpatient Hospital Stay (HOSPITAL_COMMUNITY): Payer: Medicare Other

## 2020-04-21 ENCOUNTER — Inpatient Hospital Stay (HOSPITAL_COMMUNITY): Payer: Medicare Other | Admitting: *Deleted

## 2020-04-21 ENCOUNTER — Inpatient Hospital Stay (HOSPITAL_COMMUNITY): Payer: Medicare Other | Admitting: Occupational Therapy

## 2020-04-21 LAB — GLUCOSE, CAPILLARY
Glucose-Capillary: 154 mg/dL — ABNORMAL HIGH (ref 70–99)
Glucose-Capillary: 211 mg/dL — ABNORMAL HIGH (ref 70–99)
Glucose-Capillary: 140 mg/dL — ABNORMAL HIGH (ref 70–99)
Glucose-Capillary: 241 mg/dL — ABNORMAL HIGH (ref 70–99)

## 2020-04-21 LAB — BASIC METABOLIC PANEL
Anion gap: 11 (ref 5–15)
BUN: 27 mg/dL — ABNORMAL HIGH (ref 8–23)
CO2: 25 mmol/L (ref 22–32)
Calcium: 9.2 mg/dL (ref 8.9–10.3)
Chloride: 106 mmol/L (ref 98–111)
Creatinine, Ser: 1.1 mg/dL — ABNORMAL HIGH (ref 0.44–1.00)
GFR calc Af Amer: 58 mL/min — ABNORMAL LOW (ref >60)
GFR calc non Af Amer: 50 mL/min — ABNORMAL LOW (ref >60)
Glucose, Bld: 164 mg/dL — ABNORMAL HIGH (ref 70–99)
Potassium: 3.8 mmol/L (ref 3.5–5.1)
Sodium: 142 mmol/L (ref 135–145)

## 2020-04-21 MED ORDER — SODIUM CHLORIDE 0.45 % IV SOLN: INTRAVENOUS | Status: DC

## 2020-04-21 MED ORDER — INSULIN DETEMIR 100 UNIT/ML ~~LOC~~ SOLN
19.0000 [IU] | Freq: Two times a day (BID) | SUBCUTANEOUS | Status: DC
  Administered 2020-04-21 – 2020-04-22 (×2): 19 [IU] via SUBCUTANEOUS
  Filled 2020-04-21 (×4): qty 0.19

## 2020-04-21 NOTE — Progress Notes (Signed)
Physical Therapy Session Note  Patient Details  [name] MRN: [id number] Date of Birth: [birth year]  Today's Date: 04/21/2020 PT Individual Time: 1884-1660 PT Individual Time Calculation (min): 70 min   Short Term Goals: Week 1:  PT Short Term Goal 1 (Week 1): Pt will perform supine<>sit with max assist of 1 PT Short Term Goal 1 - Progress (Week 1): Not met PT Short Term Goal 2 (Week 1): Pt will demonstrate ability to maintain static sitting EOB with close supervision for 5 minutes PT Short Term Goal 2 - Progress (Week 1): Not met PT Short Term Goal 3 (Week 1): Pt will participate in sitting balance outcome measure PT Short Term Goal 3 - Progress (Week 1): Not met PT Short Term Goal 4 (Week 1): Pt will perform bed<>chair transfers with +2 max assist PT Short Term Goal 4 - Progress (Week 1): Met  Skilled Therapeutic Interventions/Progress Updates: Pt presented in bed sleeping and requiring extensive stimulation for arousal. Pt noted to be lethargic at beginning of session but improved with activity. Performed rolling L/R maxA x 1 with increased time to don pants (pt noted to be dry at start of session). Pt required [facility] assist for reaching for bedrail with RUE to turn to L. Performed supine to sit max A x 1 but requiring maxA for sitting balance due to heavy posterior lean. Performed SB transfer to L maxA with +2 present for safety. Pt transported to ortho gym and performed standing frame for approx 10 min with pt performing reaching activities, reaching with RUE and crossing midline placing items in cup on L. Pt required increased time and max facilitation to cross midline but was able to perform x 5. While in standing frame pt noted to have incontinent BM. Pt transported back to room and performed SB transfer total A x 1. Pt required maxA for rolling to allow PTA to remove pants and perform peri-care. Pt repositioned at end of session and left with bed alarm on, call bell within reach  and needs met.   Therapy Documentation Precautions:  Precautions Precautions: Fall, Other (comment) Precaution Comments: L hemi with hypertonia and L inattention Restrictions Weight Bearing Restrictions: No General:   Vital Signs: Therapy Vitals Temp: 98.2 F (36.8 C) Pulse Rate: 63 Resp: 17 BP: (!) 98/57 Patient Position (if appropriate): Lying Oxygen Therapy SpO2: 96 % O2 Device: Room Air   Therapy/Group: Individual Therapy  Mikenzie Mccannon  Jochebed Bills, PTA  04/21/2020, 4:28 PM

## 2020-04-21 NOTE — Progress Notes (Signed)
Occupational Therapy Weekly Progress Note  Patient Details  [name] MRN: [id number] Date of Birth: [birth year]  Beginning of progress report period: April 12, 2020 End of progress report period: April 21, 2020   Patient has met 0 of 4 short term goals. Pt making slow progress towards occupational therapy goals this week. Pt has also had several changes in medication that could have potentially been a barrier to progress this week. Pt's family has been present during 82 of therapy sessions to provide motivation to pt and also to observe pt in therapy sessions. Pt continues to be be a strong pusher to the L and needs max cuing for visual scanning to the L throughout session. +2 total A for functional transfers with use of slide board. Pt's sitting balance can vary from mod - total A with posterior bias as well as pushing towards the L. Pt needing total A for UB and LB self care tasks with LB performed from bed level and UB at sink while in wheelchair. Pt needing max cuing for initiation, sequencing, and termination of tasks. Pt would continue to benefit from OT intervention to address above deficits.   Patient continues to demonstrate the following deficits: muscle weakness, decreased cardiorespiratoy endurance, abnormal tone and decreased coordination, visual impairment, decreased midline orientation, decreased attention to left, left side neglect and decreased motor planning, decreased initiation, decreased attention, decreased awareness, decreased problem solving, decreased safety awareness and delayed processing and decreased sitting balance, decreased postural control, hemiplegia and decreased balance strategies and therefore will continue to benefit from skilled OT intervention to enhance overall performance with BADL and Reduce care partner burden.  Patient making minimal progress towards goals this week and will continue to monitor to determine appropriateness..    OT Short Term  Goals Week 1:  OT Short Term Goal 1 (Week 1): Pt will sit EOB/EOM for 5 min with MOD A of 1 for sitting balance during functional activity OT Short Term Goal 1 - Progress (Week 1): Not met OT Short Term Goal 2 (Week 1): Pt will terminate washing face with no VC OT Short Term Goal 2 - Progress (Week 1): Not met OT Short Term Goal 3 (Week 1): Pt will locate 2/2 grooming items on L side of sink wiht min VC OT Short Term Goal 3 - Progress (Week 1): Not met OT Short Term Goal 4 (Week 1): Pt will complete 1/4 steps of donning shirt with multimodal cuing OT Short Term Goal 4 - Progress (Week 1): Not met OT Short Term Goal 5 (Week 1): Pt will transfer to w/c with MAX A of 2 in prep for [facility] transfer OT Short Term Goal 5 - Progress (Week 1): Not met Week 2:  OT Short Term Goal 1 (Week 2): Pt will complete 1/4 steps of donning shirt with multimodal cuing. OT Short Term Goal 2 (Week 2): Pt will locate 2/2 grooming items on L side of sink with min VCs. OT Short Term Goal 3 (Week 2): Pt will maintaining sitting balance for 5 minutes on EOB with mod A for self care tasks.  Skilled Therapeutic Interventions/Progress Updates:      Therapy Documentation Precautions:  Precautions Precautions: Fall, Other (comment) Precaution Comments: L hemi with hypertonia and L inattention Restrictions Weight Bearing Restrictions: No General:   Vital Signs: Therapy Vitals Temp: 98.2 F (36.8 C) Pulse Rate: 63 Resp: 17 BP: (!) 98/57 Patient Position (if appropriate): Lying Oxygen Therapy SpO2: 96 % O2 Device: Room Air  Pain:   ADL:   Vision   Perception    Praxis   Exercises:   Other Treatments:     Therapy/Group: Individual Therapy  Gypsy Decant 04/21/2020, 4:55 PM

## 2020-04-21 NOTE — Progress Notes (Signed)
Occupational Therapy Session Note  Patient Details  [name] MRN: [id number] Date of Birth: 1945/11/23  Today's Date: 04/21/2020 OT Individual Time: 4098-1191 OT Individual Time Calculation (min): 23 min  and Today's Date: 04/21/2020 OT Missed Time:  40 Missed Time Reason:  lethargy   Short Term Goals: Week 1:  OT Short Term Goal 1 (Week 1): Pt will sit EOB/EOM for 5 min with MOD A of 1 for sitting balance during functional activity OT Short Term Goal 2 (Week 1): Pt will terminate washing face with no VC OT Short Term Goal 3 (Week 1): Pt will locate 2/2 grooming items on L side of sink wiht min VC OT Short Term Goal 4 (Week 1): Pt will complete 1/4 steps of donning shirt with multimodal cuing OT Short Term Goal 5 (Week 1): Pt will transfer to w/c with MAX A of 2 in prep for [facility] transfer  Skilled Therapeutic Interventions/Progress Updates:    Upon entering the room, pt supine in bed and sleeping soundly. Pt's daughter and husband present in the room and expressing concern over lethargy during today's therapy sessions. Caregiver did report new medication given last night and OT discussed how that could potential be the reason but notified RN. OT asking caregiver how to best support pt and family during this session and they asked to speak to PA further and allow pt to rest. OT notifying PA of caregiver concerns. Pt remained sleeping soundly with all needs within reach and bed alarm activated.   Therapy Documentation Precautions:  Precautions Precautions: Fall, Other (comment) Precaution Comments: L hemi with hypertonia and L inattention Restrictions Weight Bearing Restrictions: No General:   Vital Signs: Therapy Vitals Temp: 98.2 F (36.8 C) Pulse Rate: 63 Resp: 17 BP: (!) 98/57 Patient Position (if appropriate): Lying Oxygen Therapy SpO2: 96 % O2 Device: Room Air Pain: Pain Assessment Faces Pain Scale: No hurt   Therapy/Group: Individual Therapy  Alen Bleacher 04/21/2020, 3:39 PM

## 2020-04-21 NOTE — Progress Notes (Signed)
Patient more sedated today family concerned possibly due to baclofen.  Will hold baclofen for today and reevaluate in a.m.

## 2020-04-21 NOTE — Progress Notes (Signed)
Speech Language Pathology Daily Session Note  Patient Details  Name: SULAMITA LAFOUNTAIN MRN: [id number] Date of Birth: [birth year]  Today's Date: 04/21/2020 SLP Individual Time: 0093-8182 SLP Individual Time Calculation (min): 51 min  Short Term Goals: Week 2: SLP Short Term Goal 1 (Week 2): Pt will consume dys 1 textures and honey thick liquids with min assist for use of swallowing strategies to mitigate left anterior spillage of boluses. SLP Short Term Goal 2 (Week 2): Pt will consume therapeutic trials of dys 2 solids with min assist cues for use of swallowing strategies to clear boluses from the oral cavity. SLP Short Term Goal 3 (Week 2): Pt will consume therapeutic trials of thin liquids over 3 consecutive sessions with out respiratory decompensation prior to repeat MBS. SLP Short Term Goal 4 (Week 2): Pt sill sustain her attention to basic familiar tasks for 3 minute intervals with min assist multimodal cues for redirection. SLP Short Term Goal 5 (Week 2): Pt will complete basic, familiar tasks with mod assist  multimodal cues for functional problem solving. SLP Short Term Goal 6 (Week 2): Pt will locate items to the left of midline during basic, familiar tasks with mod assist multimodal cues.  Skilled Therapeutic Interventions: Skilled ST services focused on swallow and cognitive skills. Pt was asleep upon entering room and daughter expressed increase lethargy after administration of medication over the weekend. Pt increased alertness following cold compress and max A tactile/verbal cues. Pt was able to maintain alertness fo the initial 30 minutes of the treatment session, but after this time pt eventually was unable to keep eyes open and treatment session was ended a few minutes early. During her period of alertness, pt consumed thin liquid via cup and straw, following oral care provided by SLP. Pt consumed 3oz of thin via cup and straw with x1 delayed cough noted when a large bolus was  consumed. Pt demonstrated multiple swallows and mild left anterior spillage, but no oral holding noted, swallow appeared timely and no other s/s aspiration noted. Pt consumed dys 2 textured snack with mild oral residue and reduced bolus cohesion x1 in which she able to form bolus and clear residue when given tsp of puree. Pt demonstrates improved oral control and would benefit from a repeat MBS once alertness/attention is better maintained. SLP facilitated sustained attention and basic problem solving with simple card sorting task by two colors and return demonstration of simple pattern utilizing colored blocks, however pt was unable to complete due to reduced alertness. Pt was left in room with daughter, bed alarm set and call bell within reach. Recommend to continue skilled ST services.     Pain Pain Assessment Pain Scale: Faces Pain Score: Asleep Faces Pain Scale: No hurt  Therapy/Group: Individual Therapy  Khamya Topp  [facility] 04/21/2020, 12:22 PM

## 2020-04-21 NOTE — Progress Notes (Signed)
[place] PHYSICAL MEDICINE & REHABILITATION PROGRESS NOTE   Subjective/Complaints: No complaints this morning. Sleepy.  BMP stable.   ROS: limited due to cognition.   Objective:   No results found. No results for input(s): WBC, HGB, HCT, PLT in the last 72 hours. Recent Labs    04/21/20 0627  NA 142  K 3.8  CL 106  CO2 25  GLUCOSE 164*  BUN 27*  CREATININE 1.10*  CALCIUM 9.2    Intake/Output Summary (Last 24 hours) at 04/21/2020 1015 Last data filed at 04/21/2020 0800 Gross per 24 hour  Intake 634 ml  Output 700 ml  Net -66 ml     Physical Exam: Vital Signs Blood pressure 128/85, pulse 72, temperature 98.3 F (36.8 C), temperature source Oral, resp. rate 18, height 5\' 7"  (1.702 m), weight 74.7 kg, SpO2 98 %. General: No apparent distress HEENT: Head is normocephalic, atraumatic, PERRLA, EOMI, sclera anicteric, oral mucosa pink and moist, dentition intact, ext ear canals clear,  Neck: Supple without JVD or lymphadenopathy Heart: Reg rate and rhythm. No murmurs rubs or gallops Chest: CTA bilaterally without wheezes, rales, or rhonchi; no distress Abdomen: Soft, non-tender, non-distended, bowel sounds positive. Extremities: No clubbing, cyanosis, or edema. Pulses are 2+ Skin: Clean and intact without signs of breakdown Psych: Normal mood.  Normal behavior. Musc: No edema in extremities.  No tenderness in extremities. Neurologic: Alert Motor: Left facial weakness.  Motor: LUE/LLE: 0/5 proximal distal  Increased tone LUE Low-frequency rhythmic jerks of right side   Assessment/Plan: 1. 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  which require 3+ hours per day of interdisciplinary therapy in a comprehensive inpatient rehab setting.  Physiatrist is providing close team supervision and 24 hour management of active medical problems listed below.  Physiatrist and rehab team continue to assess barriers to discharge/monitor patient progress toward functional  and medical goals  Care Tool:  Bathing    Body parts bathed by patient: Abdomen, Face, Chest   Body parts bathed by helper: Right arm, Left arm, Front perineal area, Buttocks, Right upper leg, Left upper leg, Right lower leg, Left lower leg     Bathing assist Assist Level: Total Assistance - Patient < 25%     Upper Body Dressing/Undressing Upper body dressing   What is the patient wearing?: Pull over shirt    Upper body assist Assist Level: Dependent - Patient 0%    Lower Body Dressing/Undressing Lower body dressing      What is the patient wearing?: Pants     Lower body assist Assist for lower body dressing: 2 Helpers     Toileting Toileting Toileting Activity did not occur (Clothing management and hygiene only): N/A (no void or bm)  Toileting assist Assist for toileting: Dependent - Patient 0%     Transfers Chair/bed transfer  Transfers assist  Chair/bed transfer activity did not occur: Safety/medical concerns  Chair/bed transfer assist level: 2 Helpers     Locomotion Ambulation   Ambulation assist   Ambulation activity did not occur: Safety/medical concerns          Walk 10 feet activity   Assist  Walk 10 feet activity did not occur: Safety/medical concerns        Walk 50 feet activity   Assist Walk 50 feet with 2 turns activity did not occur: Safety/medical concerns         Walk 150 feet activity   Assist Walk 150 feet activity did not occur: Safety/medical concerns  Walk 10 feet on uneven surface  activity   Assist Walk 10 feet on uneven surfaces activity did not occur: Safety/medical concerns         Wheelchair     Assist Will patient use wheelchair at discharge?:  (TBD)             Wheelchair 50 feet with 2 turns activity    Assist            Wheelchair 150 feet activity     Assist          Blood pressure 128/85, pulse 72, temperature 98.3 F (36.8 C), temperature source  Oral, resp. rate 18, height 5\' 7"  (1.702 m), weight 74.7 kg, SpO2 98 %.  Medical Problem List and Plan: 1.  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  Status post loop recorder  Continue CIR   WHO ordered  Baclofen 5 BID started on 7/11 2.  Antithrombotics: -DVT/anticoagulation: Subcutaneous heparin             -antiplatelet therapy: Aspirin 81 mg daily 3. Pain Management: Tylenol as needed. Appears to be well controlled.              Monitor for headaches with increased activity. 4. Mood: Amantadine 150 mg daily for stimulation, tremors may be side effects D/Ced             -antipsychotic agents: N/A 5. Neuropsych: This patient is not capable of making decisions on her own behalf. 6. Skin/Wound Care: Routine skin checks 7. Fluids/Electrolytes/Nutrition: Routine in and outs.  8.  Hypertension.  Lopressor 25 mg twice daily, Norvasc 10 mg daily.    Vitals:   04/20/20 1951 04/21/20 0310  BP: 123/68 128/85  Pulse: 91 72  Resp: 16 18  Temp: 98.8 F (37.1 C) 98.3 F (36.8 C)  SpO2: 96% 98%   Lisinopril 2.5 nightly started on 7/10  7/12: well controlled.  9.  Post stroke dysphagia: Dysphagia #1 honey thick liquids.  Patient on IV fluids for hydration follow-up speech therapy             Advance diet as tolerated 10.  Hemorrhagic cystitis complicated by UTI.  Complete course of Ancef.  Follow-up outpatient urology 11.  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  Hemoglobin A1c 8.9.  Check blood sugars before meals and at bedtime CBG (last 3)  Recent Labs    04/20/20 1654 04/20/20 2104 04/21/20 0631  GLUCAP 160* 280* 154*   Metformin 500 daily  Increase Levemir to 14 units twice daily on 7/9, increased to 18 BID on 7/11  Remains elevated on 7/10, will consider further increase  tomorrow if persistent  7/12: remains elevated. Increase Levemir to 19U.              Monitor with increased mobility 12.  Hyperlipidemia.  Zetia/Pravacol 13.  Constipation. MiraLAX. Adjust bowel meds as necessary   LOS: 10 days A FACE TO FACE EVALUATION WAS PERFORMED  [place] P Rhealynn Myhre 04/21/2020, 10:15 AM

## 2020-04-22 ENCOUNTER — Inpatient Hospital Stay (HOSPITAL_COMMUNITY): Payer: Medicare Other | Admitting: Physical Therapy

## 2020-04-22 ENCOUNTER — Inpatient Hospital Stay (HOSPITAL_COMMUNITY): Payer: Medicare Other

## 2020-04-22 ENCOUNTER — Inpatient Hospital Stay (HOSPITAL_COMMUNITY): Payer: Medicare Other | Admitting: Occupational Therapy

## 2020-04-22 ENCOUNTER — Encounter: Payer: Medicare Other | Admitting: Student

## 2020-04-22 ENCOUNTER — Ambulatory Visit: Payer: Medicare Other

## 2020-04-22 LAB — COMPREHENSIVE METABOLIC PANEL
ALT: 19 U/L (ref 0–44)
AST: 26 U/L (ref 15–41)
Albumin: 2.8 g/dL — ABNORMAL LOW (ref 3.5–5.0)
Alkaline Phosphatase: 65 U/L (ref 38–126)
Anion gap: 10 (ref 5–15)
BUN: 27 mg/dL — ABNORMAL HIGH (ref 8–23)
CO2: 28 mmol/L (ref 22–32)
Calcium: 9.1 mg/dL (ref 8.9–10.3)
Chloride: 105 mmol/L (ref 98–111)
Creatinine, Ser: 1.11 mg/dL — ABNORMAL HIGH (ref 0.44–1.00)
GFR calc Af Amer: 57 mL/min — ABNORMAL LOW (ref >60)
GFR calc non Af Amer: 49 mL/min — ABNORMAL LOW (ref >60)
Glucose, Bld: 131 mg/dL — ABNORMAL HIGH (ref 70–99)
Potassium: 3.8 mmol/L (ref 3.5–5.1)
Sodium: 143 mmol/L (ref 135–145)
Total Bilirubin: 0.3 mg/dL (ref 0.3–1.2)
Total Protein: 6 g/dL — ABNORMAL LOW (ref 6.5–8.1)

## 2020-04-22 LAB — GLUCOSE, CAPILLARY
Glucose-Capillary: 115 mg/dL — ABNORMAL HIGH (ref 70–99)
Glucose-Capillary: 282 mg/dL — ABNORMAL HIGH (ref 70–99)
Glucose-Capillary: 216 mg/dL — ABNORMAL HIGH (ref 70–99)
Glucose-Capillary: 238 mg/dL — ABNORMAL HIGH (ref 70–99)

## 2020-04-22 MED ORDER — INSULIN DETEMIR 100 UNIT/ML ~~LOC~~ SOLN
20.0000 [IU] | Freq: Two times a day (BID) | SUBCUTANEOUS | Status: DC
  Administered 2020-04-22 – 2020-04-25 (×6): 20 [IU] via SUBCUTANEOUS
  Filled 2020-04-22 (×7): qty 0.2

## 2020-04-22 NOTE — Progress Notes (Signed)
Patient is alert and oriented x1 or 2, aphasia and cooperative continue periods of jerking motions to upper and lower arms and legs,continue medical regime per orders, placed on continuous saturation monitoring  throughout the shift and maintained between 98%-100%, I/O cath x 2 this shift ,IVF completed per orders, Repositioned and turned q2 hrs, monitored and assisted

## 2020-04-22 NOTE — Progress Notes (Signed)
Occupational Therapy Session Note  Patient Details  [name] MRN: [id number] Date of Birth: [birth year]  Today's Date: 04/22/2020 OT Individual Time: 1004-1100 and 1520- 1600 OT Individual Time Calculation (min): 56 min and 40 mins   Short Term Goals: Week 2:  OT Short Term Goal 1 (Week 2): Pt will complete 1/4 steps of donning shirt with multimodal cuing. OT Short Term Goal 2 (Week 2): Pt will locate 2/2 grooming items on L side of sink with min VCs. OT Short Term Goal 3 (Week 2): Pt will maintaining sitting balance for 5 minutes on EOB with mod A for self care tasks.  Skilled Therapeutic Interventions/Progress Updates:    Session 1: Upon entering the room, pt supine in bed and is alert for therapeutic intervention. Pt does report pain in B LEs with mobility task and describes it as feeling "sore". She reports having received medication prior to therapist arrival. Pt rolling L <> R with total A and therapist providing total A for peri hygiene as brief is soiled. Total A +2 to don pull over pants. Total A supine >sit on EOB with mod A static sitting balance for 4-5 minutes. Slide board placed and total A slide board transfer to the L with second person to steady equipment for safety. UB self care performed at sink with use of mirror for visual feeback. Hand over hand needed to initiate washing L UE. Pt needing max cuing for midline orientation. Total A to don pull over shirt with max A for anterior weight shift. Pt fatigued and remained in tilt in space wheelchair with chair alarm belt donned for safety and daughter present in the room.   Session 2:  Upon entering the room, pt supine in bed with daughter present briefly. Pt incontinent of bowels during prior therapy session. OT rolling pt L <> R with total A and second helper needed for hygiene and clothing management. OT doffing pt's shirt with total A and assisting pt with donning gown with total A and rolling to pull down trunk. Pt  unable to visually track even to midline this session and did not turn head at all to L when commanded to do so. Therapist unable to manual turn pt's head for visual scanning to the L. Gowns placed at midline for pt to select color and even then pt unable to see gowns and they needed to be moved into R visual field. Once pt is changed, she quickly fell asleep. Call bell and all needed items within reach upon exiting the room.   Therapy Documentation Precautions:  Precautions Precautions: Fall, Other (comment) Precaution Comments: L hemi with hypertonia and L inattention Restrictions Weight Bearing Restrictions: No   Therapy/Group: Individual Therapy  Alen Bleacher 04/22/2020, 12:25 PM

## 2020-04-22 NOTE — Progress Notes (Signed)
[place] PHYSICAL MEDICINE & REHABILITATION PROGRESS NOTE   Subjective/Complaints: No complaints this morning. CBGs have been elevated.  BMP is stable except for elevated glucose, creatinine, and decreased albumin and protein.   ROS: limited due to cognition.   Objective:   No results found. No results for input(s): WBC, HGB, HCT, PLT in the last 72 hours. Recent Labs    04/21/20 0627 04/22/20 0532  NA 142 143  K 3.8 3.8  CL 106 105  CO2 25 28  GLUCOSE 164* 131*  BUN 27* 27*  CREATININE 1.10* 1.11*  CALCIUM 9.2 9.1    Intake/Output Summary (Last 24 hours) at 04/22/2020 1908 Last data filed at 04/22/2020 1856 Gross per 24 hour  Intake 1380 ml  Output 1300 ml  Net 80 ml     Physical Exam: Vital Signs Blood pressure (!) 141/77, pulse 68, temperature 97.7 F (36.5 C), resp. rate 18, height 5\' 7"  (1.702 m), weight 74.7 kg, SpO2 99 %. General: No apparent distress, sitter at bedside.  HEENT: Head is normocephalic, atraumatic, PERRLA, EOMI, sclera anicteric, oral mucosa pink and moist, dentition intact, ext ear canals clear,  Neck: Supple without JVD or lymphadenopathy Heart: Reg rate and rhythm. No murmurs rubs or gallops Chest: CTA bilaterally without wheezes, rales, or rhonchi; no distress Abdomen: Soft, non-tender, non-distended, bowel sounds positive. Extremities: No clubbing, cyanosis, or edema. Pulses are 2+ Skin: Clean and intact without signs of breakdown Psych: Normal mood.  Normal behavior. Musc: No edema in extremities.  No tenderness in extremities. Neurologic: Alert Motor: Left facial weakness.  Motor: LUE/LLE: 0/5 proximal distal  Increased tone LUE Low-frequency rhythmic jerks of right side  Assessment/Plan: 1. 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  which require 3+ hours per day of interdisciplinary therapy in a comprehensive inpatient rehab setting.  Physiatrist is providing close team supervision and 24 hour management of active  medical problems listed below.  Physiatrist and rehab team continue to assess barriers to discharge/monitor patient progress toward functional and medical goals  Care Tool:  Bathing    Body parts bathed by patient: Abdomen, Face, Chest, Right lower leg   Body parts bathed by helper: Right arm, Left arm, Front perineal area, Buttocks, Right upper leg, Left upper leg, Left lower leg     Bathing assist Assist Level: Maximal Assistance - Patient 24 - 49%     Upper Body Dressing/Undressing Upper body dressing   What is the patient wearing?: Pull over shirt    Upper body assist Assist Level: Dependent - Patient 0%    Lower Body Dressing/Undressing Lower body dressing      What is the patient wearing?: Pants     Lower body assist Assist for lower body dressing: 2 Helpers     Toileting Toileting Toileting Activity did not occur (Clothing management and hygiene only): N/A (no void or bm)  Toileting assist Assist for toileting: Dependent - Patient 0%     Transfers Chair/bed transfer  Transfers assist  Chair/bed transfer activity did not occur: Safety/medical concerns  Chair/bed transfer assist level: 2 Helpers     Locomotion Ambulation   Ambulation assist   Ambulation activity did not occur: Safety/medical concerns          Walk 10 feet activity   Assist  Walk 10 feet activity did not occur: Safety/medical concerns        Walk 50 feet activity   Assist Walk 50 feet with 2 turns activity did not occur: Safety/medical concerns  Walk 150 feet activity   Assist Walk 150 feet activity did not occur: Safety/medical concerns         Walk 10 feet on uneven surface  activity   Assist Walk 10 feet on uneven surfaces activity did not occur: Safety/medical concerns         Wheelchair     Assist Will patient use wheelchair at discharge?:  (TBD)             Wheelchair 50 feet with 2 turns activity    Assist             Wheelchair 150 feet activity     Assist          Blood pressure (!) 141/77, pulse 68, temperature 97.7 F (36.5 C), resp. rate 18, height 5\' 7"  (1.702 m), weight 74.7 kg, SpO2 99 %.  Medical Problem List and Plan: 1.  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  Status post loop recorder  Continue CIR  WHO ordered  Baclofen 5 BID started on 7/11 2.  Antithrombotics: -DVT/anticoagulation: Subcutaneous heparin             -antiplatelet therapy: Aspirin 81 mg daily 3. Pain Management: Tylenol as needed. Appears to be well controlled.              Monitor for headaches with increased activity. 4. Mood: Amantadine 150 mg daily for stimulation, tremors may be side effects D/Ced             -antipsychotic agents: N/A 5. Neuropsych: This patient is not capable of making decisions on her own behalf. 6. Skin/Wound Care: Routine skin checks 7. Fluids/Electrolytes/Nutrition: Routine in and outs. Protein and albumin are low on BMP 7/13. Ordered dietary consult. 8.  Hypertension.  Lopressor 25 mg twice daily, Norvasc 10 mg daily.    Vitals:   04/22/20 0627 04/22/20 1322  BP: (!) 141/69 (!) 141/77  Pulse: 72 68  Resp:  18  Temp: 98.5 F (36.9 C) 97.7 F (36.5 C)  SpO2: 97% 99%   Lisinopril 2.5 nightly started on 7/10  7/12: well controlled.  9.  Post stroke dysphagia: Dysphagia #1 honey thick liquids.  Patient on IV fluids for hydration follow-up speech therapy             Advance diet as tolerated 10.  Hemorrhagic cystitis complicated by UTI.  Complete course of Ancef.  Follow-up outpatient urology 11.  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  Hemoglobin A1c 8.9.  Check blood sugars before meals and at bedtime CBG (last 3)  Recent Labs    04/22/20 0624 04/22/20 1116  04/22/20 1659  GLUCAP 115* 282* 216*   Metformin 500 daily  7/13: remains elevated increase Levemir to 20U.              Monitor with increased mobility 12.  Hyperlipidemia.  Zetia/Pravacol 13.  Constipation. MiraLAX. Adjust bowel meds as necessary   LOS: 11 days A FACE TO FACE EVALUATION WAS PERFORMED  8/13 P Milagro Belmares 04/22/2020, 7:08 PM

## 2020-04-22 NOTE — Progress Notes (Signed)
Physical Therapy Session Note  Patient Details  [name] MRN: [id number] Date of Birth: May 08, 1946  Today's Date: 04/22/2020 PT Individual Time: 1405-1450 PT Individual Time Calculation (min): 45 min   Short Term Goals: Week 2:  PT Short Term Goal 1 (Week 2): Pt will demonstrate ability to maintain static sitting EOB with close supervision for 5 minutes PT Short Term Goal 2 (Week 2): Pt will perform supine<>sit with max assist of 1 PT Short Term Goal 3 (Week 2): pt to demonstrate transfer to/from bed<>chair at mod A x1  Skilled Therapeutic Interventions/Progress Updates: Pt presented in TIS with family present agreeable to therapy. Pt states some pain in RLE but no intervention required at start of session. Pt transported to ortho gym total A and set up to participate in standing frame for wt bearing through LLE and truncal activation. Pt noted throughout session to not track past midline with eyes and no initiation to turn head. Pt tolerated initially x 5 min in standing frame with max facilitation with PTA attempting to facilitate L knee extension and second therapist facilitating trunk extension. During seated rest break pt participated in R leaning facilitated by therapist tfor L trunk rotation. PTA performed gentle LUE ROM while pt in stretch. Pt then performed second stand for approx 5 min with PTA able to maintain LLE in extension and providing manual facilitation for wt shift to R. During second stand pt noted to be incontinent of bowel. Pt returned to TIS and transported back to room. Pt transferred back to bed via squat pivot total A x 2. Pt repositioned and left in bed with NT notified of pt's disposition.      Therapy Documentation Precautions:  Precautions Precautions: Fall, Other (comment) Precaution Comments: L hemi with hypertonia and L inattention Restrictions Weight Bearing Restrictions: No General:   Vital Signs: Therapy Vitals Temp: 97.7 F (36.5  C) Pulse Rate: 68 Resp: 18 BP: (!) 141/77 Patient Position (if appropriate): Sitting Oxygen Therapy SpO2: 99 % O2 Device: Room Air Pain: Pain Assessment Pain Score: 0-No pain Mobility:   Locomotion :    Trunk/Postural Assessment :    Balance:   Exercises:   Other Treatments:      Therapy/Group: Individual Therapy  Alexandro Line 04/22/2020, 4:29 PM

## 2020-04-22 NOTE — Progress Notes (Signed)
Speech Language Pathology Daily Session Note  Patient Details  [name] MRN: [id number] Date of Birth: 11-25-1945  Today's Date: 04/22/2020 SLP Individual Time: 3832-9191 SLP Individual Time Calculation (min): 27 min  Short Term Goals: Week 2: SLP Short Term Goal 1 (Week 2): Pt will consume dys 1 textures and honey thick liquids with min assist for use of swallowing strategies to mitigate left anterior spillage of boluses. SLP Short Term Goal 2 (Week 2): Pt will consume therapeutic trials of dys 2 solids with min assist cues for use of swallowing strategies to clear boluses from the oral cavity. SLP Short Term Goal 3 (Week 2): Pt will consume therapeutic trials of thin liquids over 3 consecutive sessions with out respiratory decompensation prior to repeat MBS. SLP Short Term Goal 4 (Week 2): Pt sill sustain her attention to basic familiar tasks for 3 minute intervals with min assist multimodal cues for redirection. SLP Short Term Goal 5 (Week 2): Pt will complete basic, familiar tasks with mod assist  multimodal cues for functional problem solving. SLP Short Term Goal 6 (Week 2): Pt will locate items to the left of midline during basic, familiar tasks with mod assist multimodal cues.  Skilled Therapeutic Interventions:  Skilled ST services focused on swallow skills. Pt was asleep upon entering the room but easily awakened. Pt had dentures still in oral cavity, pt supports from the night before. SLP removed and clean dentures prior to trials of thin liquid via cup and straw. Pt demonstrated oral holding on initial 3 cup sips, then swallow appeared timely on remaining trials (60%) and multiple swallows noted on 50% of trials. Pt demonstrated no overt s/s aspiration and mod I to consume small sips following initial instruction. Pt demonstrated moderate left anterior spillage with cup sips verse mild left anterior spillage with use of straw. SLP notified NT to assist with breakfast tray and  educated pt on scheduled MBS for tomorrow. Pt had no questions. Pt was left in room with call bell within reach and bed alarm set. ST recommends to continue skilled ST services.      Pain Pain Assessment Pain Score: 0-No pain  Therapy/Group: Individual Therapy  Glenroy Crossen  [facility] 04/22/2020, 3:55 PM

## 2020-04-23 ENCOUNTER — Inpatient Hospital Stay (HOSPITAL_COMMUNITY): Payer: Medicare Other | Admitting: Physical Therapy

## 2020-04-23 ENCOUNTER — Inpatient Hospital Stay (HOSPITAL_COMMUNITY): Payer: Medicare Other

## 2020-04-23 ENCOUNTER — Encounter (HOSPITAL_COMMUNITY): Payer: Medicare Other | Admitting: Speech Pathology

## 2020-04-23 ENCOUNTER — Inpatient Hospital Stay (HOSPITAL_COMMUNITY): Payer: Medicare Other | Admitting: Occupational Therapy

## 2020-04-23 LAB — GLUCOSE, CAPILLARY
Glucose-Capillary: 128 mg/dL — ABNORMAL HIGH (ref 70–99)
Glucose-Capillary: 206 mg/dL — ABNORMAL HIGH (ref 70–99)
Glucose-Capillary: 206 mg/dL — ABNORMAL HIGH (ref 70–99)
Glucose-Capillary: 249 mg/dL — ABNORMAL HIGH (ref 70–99)

## 2020-04-23 NOTE — Patient Care Conference (Signed)
Inpatient RehabilitationTeam Conference and Plan of Care Update Date: 04/23/2020   Time: 1:39 PM    Patient [name]      Medical Record Number: [id number]  Date of Birth: [birth year] Sex: Female         Room/Bed: 4W20C/4W20C-01 Payor Info: Payor: [employer] MEDICARE / Plan: [employer] MEDICARE / Product Type: *No Product type* /    Admit Date/Time:  04/11/2020  6:15 PM  Primary Diagnosis:  Acute ischemic right MCA stroke [facility])  Hospital Problems: Principal Problem:   Acute ischemic right MCA stroke (HCC) s/p tPA & attempted revascularization, embolic stroke, source unknown Active Problems:   Pressure injury of skin   Right middle cerebral artery stroke (HCC)   Uncontrolled type 2 diabetes mellitus with hyperglycemia (HCC)   Dysphagia, post-stroke   Labile blood pressure   Left hemiplegia (HCC)   Spastic hemiplegia affecting nondominant side (HCC)    Expected Discharge Date: Expected Discharge Date:  (SNF placement)  Team Members Present: Physician leading conference: Dr. Claudette Laws Care Coodinator Present: Chana Bode, RN, BSN, CRRN;Christina Chili, BSW Nurse Present: Doran Durand, LPN PT Present: Grier Rocher, PT OT Present: Jackquline Denmark, OT SLP Present: Suzzette Righter, CF-SLP PPS Coordinator present : Fae Pippin, SLP     Current Status/Progress Goal Weekly Team Focus  Bowel/Bladder   Pt is incontinent of b/b, LBM 7/12/  Pt will begin to void on her on.  q2htoileting, In/Out cath per order   Swallow/Nutrition/ Hydration   Dys 1, nectar, Mod A  Min A  MBS, swallow strategies   ADL's   LB from bed level with total A, UB in wheelchair at sink total A, slide board transfer with +2, decreased visual scanning, attention, pusher syndrome, and fatigues easily  Supervision-Min A overall, Mod A LB dressing + toileting  functional transfers, NMR,functional cognition, trunk control, praxis   Mobility   continues to demonstrate heavy posterior lean  in sitting due to pushing, decreased initiation, max-total A SB transfers, maxA bed mobilty  minA bed mobility and transfers  bed mobility, sitting, balance, transfers   Communication   Min-Mod A limited verbal output  Supervision A  increase verbal output and use of speech intelligibility strategies   Safety/Cognition/ Behavioral Observations  Mod -Max A  Min A  basic problem solving, intellectual awareness, sustained attention, viscal scanning and recall   Pain   Pt c/o of pain in her neck and shoulders  Reduce or eliminate pain   Assess pain qshift/ PRN   Skin   Pt has 3 small stage 1 on buttocks. foam in place  refrain from furthur infection or breakdown  Assess skin qshift/PRN     Team Discussion:  Discharge Planning/Teaching Needs:  Daughter plans to discharge patient to SNF  Will schedule with DTR   Current Update: on target  Current Barriers to Discharge:  Lack of/limited family support and poor functional gain Incontinence; incontinent of bowel and not voiding on her own  Possible Resolutions to Barriers: Baclofen for spasms was discontinued by MD Intermittent catheterizations for bladder control SNF  Patient on target to meet rehab goals: no, reviewed functional decline and modified goals with family. Daughter agreeable with SNF placement, FL2 out pending bed offer Functional decline, more alert than previous note, however worsening sitting balance, trunk control assistance, requiring hand over hand assistance for activities due to cognitive changes. Max assist for recall, attention and spasms have returned on the right side , now extending up into the shoulder  area.  *See Care Plan and progress notes for long and short-term goals.   Revisions to Treatment Plan:  Goals downgraded to MOD assist overall    Medical Summary Current Status: diabetic control fair, HTN control fair/good, poor sleep Weekly Focus/Goal: remain dependnt on caths  Barriers to Discharge:  Medical stability   Possible Resolutions to Barriers: Trial off IVF, monitor intake, improve bladder emptying   Continued Need for Acute Rehabilitation Level of Care: The patient requires daily medical management by a physician with specialized training in physical medicine and rehabilitation for the following reasons: Direction of a multidisciplinary physical rehabilitation program to maximize functional independence : Yes Medical management of patient stability for increased activity during participation in an intensive rehabilitation regime.: Yes Analysis of laboratory values and/or radiology reports with any subsequent need for medication adjustment and/or medical intervention. : Yes   I attest that I was present, lead the team conference, and concur with the assessment and plan of the team.   Chana Bode B 04/23/2020, 1:39 PM

## 2020-04-23 NOTE — Progress Notes (Signed)
Physical Therapy Session Note  Patient Details  [name] MRN: [id number] Date of Birth: 1946/09/10  Today's Date: 04/23/2020 PT Individual Time: 1630-1700 PT Individual Time Calculation (min): 30 min   Short Term Goals: Week 2:  PT Short Term Goal 1 (Week 2): Pt will demonstrate ability to maintain static sitting EOB with close supervision for 5 minutes PT Short Term Goal 2 (Week 2): Pt will perform supine<>sit with max assist of 1 PT Short Term Goal 3 (Week 2): pt to demonstrate transfer to/from bed<>chair at mod A x1  Skilled Therapeutic Interventions/Progress Updates:   Pt received supine in bed and agreeable to PT at bed level. PT noted spasms in the L HS resulting in reflexive pain response in the RUE/RLE. PT performed AAROM/PROM into abduction, knee/hip flexion/extension, HS stretch. RUE ER, shoulder flexion with elbow extension. Each performed 2 x 1 min with incremental increase as tolerated. Noted flexor response intermittently, muscle vibration aplied by PT to rest flexor tone. Pt performed visual scanning to the L to trag target to the L and hold 4 x 30 sec. corss body reach with the RUE to the L to force attention to the L visual field and awareness of body. Pt reporting extreme fatigue and allowed to rest in bed with call bell in place and all needs met.      Therapy Documentation Precautions:  Precautions Precautions: Fall, Other (comment) Precaution Comments: L hemi with hypertonia and L inattention Restrictions Weight Bearing Restrictions: No    Pain:    denies  Therapy/Group: Individual Therapy  Lorie Phenix 04/23/2020, 5:29 PM

## 2020-04-23 NOTE — Progress Notes (Signed)
Patient ID: Meredith Shaw, female   DOB: 1946-08-30, 74 y.o.   MRN: [id number] Team Conference Report to Patient/Family  Team Conference discussion was reviewed with the patient and caregiver, including goals, any changes in plan of care and target discharge date.  Patient and caregiver express understanding and are in agreement.  The patient has a target discharge date of  (SNF placement).  [name] 04/23/2020, 2:08 PM

## 2020-04-23 NOTE — Progress Notes (Signed)
[place] PHYSICAL MEDICINE & REHABILITATION PROGRESS NOTE   Subjective/Complaints:  Washing face using washcloth RUE [place] here for MBS this am   ROS: limited due to cognition.   Objective:   No results found. No results for input(s): WBC, HGB, HCT, PLT in the last 72 hours. Recent Labs    04/21/20 0627 04/22/20 0532  NA 142 143  K 3.8 3.8  CL 106 105  CO2 25 28  GLUCOSE 164* 131*  BUN 27* 27*  CREATININE 1.10* 1.11*  CALCIUM 9.2 9.1    Intake/Output Summary (Last 24 hours) at 04/23/2020 0856 Last data filed at 04/23/2020 0746 Gross per 24 hour  Intake 378 ml  Output 800 ml  Net -422 ml     Physical Exam: Vital Signs Blood pressure 131/80, pulse 74, temperature 98.2 F (36.8 C), temperature source Oral, resp. rate 16, height 5' 7"  (1.702 m), weight 74.7 kg, SpO2 99 %.  General: No acute distress Mood and affect are appropriate Heart: Regular rate and rhythm no rubs murmurs or extra sounds Lungs: Clear to auscultation, breathing unlabored, no rales or wheezes Abdomen: Positive bowel sounds, soft nontender to palpation, nondistended Extremities: No clubbing, cyanosis, or edema   Psych: Normal mood.  Normal behavior. Musc: No edema in extremities.  No tenderness in extremities. Neurologic: Alert Motor: Left facial weakness.  Motor: LUE/LLE: 0/5 proximal distal  Increased tone LUE Low-frequency rhythmic jerks of right side  Assessment/Plan: 1. 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  which require 3+ hours per day of interdisciplinary therapy in a comprehensive inpatient rehab setting.  Physiatrist is providing close team supervision and 24 hour management of active medical problems listed below.  Physiatrist and rehab team continue to assess barriers to discharge/monitor patient progress toward functional and medical goals  Care Tool:  Bathing    Body parts bathed by patient: Abdomen, Face, Chest, Right lower leg   Body parts bathed  by helper: Right arm, Left arm, Front perineal area, Buttocks, Right upper leg, Left upper leg, Left lower leg     Bathing assist Assist Level: Maximal Assistance - Patient 24 - 49%     Upper Body Dressing/Undressing Upper body dressing   What is the patient wearing?: Pull over shirt    Upper body assist Assist Level: Dependent - Patient 0%    Lower Body Dressing/Undressing Lower body dressing      What is the patient wearing?: Pants     Lower body assist Assist for lower body dressing: 2 Helpers     Toileting Toileting Toileting Activity did not occur (Clothing management and hygiene only): N/A (no void or bm)  Toileting assist Assist for toileting: Dependent - Patient 0%     Transfers Chair/bed transfer  Transfers assist  Chair/bed transfer activity did not occur: Safety/medical concerns  Chair/bed transfer assist level: 2 Helpers     Locomotion Ambulation   Ambulation assist   Ambulation activity did not occur: Safety/medical concerns          Walk 10 feet activity   Assist  Walk 10 feet activity did not occur: Safety/medical concerns        Walk 50 feet activity   Assist Walk 50 feet with 2 turns activity did not occur: Safety/medical concerns         Walk 150 feet activity   Assist Walk 150 feet activity did not occur: Safety/medical concerns         Walk 10 feet on uneven surface  activity  Assist Walk 10 feet on uneven surfaces activity did not occur: Safety/medical concerns         Wheelchair     Assist Will patient use wheelchair at discharge?:  (TBD)             Wheelchair 50 feet with 2 turns activity    Assist            Wheelchair 150 feet activity     Assist          Blood pressure 131/80, pulse 74, temperature 98.2 F (36.8 C), temperature source Oral, resp. rate 16, height 5' 7"  (1.702 m), weight 74.7 kg, SpO2 99 %.  Medical Problem List and Plan: 1.  Left side hemiplegia,  now with spasticity, with dysarthria and dysphagia secondary to right MCA infarction status post TPA and IR with TIC13 revascularization reoccluded on follow-up, infarct embolic secondary to unknown source.  Status post loop recorder  Continue CIR Team conference today please see physician documentation under team conference tab, met with team  to discuss problems,progress, and goals. Formulized individual treatment plan based on medical history, underlying problem and comorbidities.  WHO ordered  Baclofen 5 BID started on 7/11 2.  Antithrombotics: -DVT/anticoagulation: Subcutaneous heparin             -antiplatelet therapy: Aspirin 81 mg daily 3. Pain Management: Tylenol as needed. Appears to be well controlled.              Monitor for headaches with increased activity. 4. Mood: Amantadine 150 mg daily for stimulation, tremors may be side effects D/Ced             -antipsychotic agents: N/A 5. Neuropsych: This patient is not capable of making decisions on her own behalf. 6. Skin/Wound Care: Routine skin checks 7. Fluids/Electrolytes/Nutrition: Routine in and outs. Protein and albumin are low on BMP 7/13. Ordered dietary consult. 8.  Hypertension.  Lopressor 25 mg twice daily, Norvasc 10 mg daily.    Vitals:   04/22/20 2221 04/23/20 0331  BP: (!) 127/102 131/80  Pulse: 94 74  Resp:  16  Temp:  98.2 F (36.8 C)  SpO2:  99%   Lisinopril 2.5 nightly started on 7/10  7/14: well controlled.  9.  Post stroke dysphagia: Dysphagia #1 honey thick liquids.  Patient on IV fluids for hydration follow-up speech therapy             Advance diet as tolerated 10.  Hemorrhagic cystitis complicated by UTI.  Complete course of Ancef.  Follow-up outpatient urology 11.  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  Hemoglobin A1c 8.9.  Check blood sugars before meals and at bedtime CBG (last 3)  Recent Labs    04/22/20 1659 04/22/20 2214 04/23/20 0652  GLUCAP 216* 238* 128*    Metformin 500 daily  7/13: remains elevated increase Levemir to 20U. Monitor effect for 1-2 d             Monitor with increased mobility 12.  Hyperlipidemia.  Zetia/Pravacol 13.  Constipation. MiraLAX. Adjust bowel meds as necessary   LOS: 12 days A FACE TO FACE EVALUATION WAS PERFORMED  Charlett Blake 04/23/2020, 8:56 AM

## 2020-04-23 NOTE — Progress Notes (Signed)
Modified Barium Swallow Progress Note  Patient Details  [name] MRN: [id number] Date of Birth: [birth year]  Today's Date: 04/23/2020  Modified Barium Swallow completed.  Full report located under Chart Review in the Imaging Section.  Brief recommendations include the following:  Clinical Impression  Patient presents with mildly improved swallowing function since previous MBS but severe lethargy and cognitive impairments continue to impact her overall swallowing function. Oral phase is marked by left anterior spillage, prolonged AP transit and impaired mastication with all solids and liquids. Patient consistently triggered the swallow at the pyriform sinuses with both nectar-thick and thin liquids resulting in consistent deep penetration and one episode of silent aspiration with thin liquids. Nectar-thick liquids via straw were not penetrated. Due to patient's ongoing lethargy, cognitive impairments, and limited mobility, recommend patient remain on Dys. 1 textures but upgrade to nectar-thick liquids via straw. Patient's daughter present during study and educated on results and recommendations. She verbalized understanding and agreement.   Swallow Evaluation Recommendations       SLP Diet Recommendations: Dysphagia 1 (Puree) solids;Nectar thick liquid   Liquid Administration via: Straw   Medication Administration: Crushed with puree   Supervision: Patient able to self feed;Staff to assist with self feeding   Compensations: Monitor for anterior loss;Small sips/bites;Slow rate;Follow solids with liquid;Minimize environmental distractions   Postural Changes: Seated upright at 90 degrees   Oral Care Recommendations: Oral care BID   Other Recommendations: Order thickener from pharmacy;Prohibited food (jello, ice cream, thin soups);Remove water pitcher;Have oral suction available    Henrick Mcgue 04/23/2020,3:01 PM

## 2020-04-23 NOTE — Progress Notes (Signed)
Occupational Therapy Session Note  Patient Details  [name] MRN: [id number] Date of Birth: Dec 23, 1945  Today's Date: 04/23/2020 OT Individual Time: 1115-1200 OT Individual Time Calculation (min): 45 min    Short Term Goals: Week 1:  OT Short Term Goal 1 (Week 1): Pt will sit EOB/EOM for 5 min with MOD A of 1 for sitting balance during functional activity OT Short Term Goal 1 - Progress (Week 1): Not met OT Short Term Goal 2 (Week 1): Pt will terminate washing face with no VC OT Short Term Goal 2 - Progress (Week 1): Not met OT Short Term Goal 3 (Week 1): Pt will locate 2/2 grooming items on L side of sink wiht min VC OT Short Term Goal 3 - Progress (Week 1): Not met OT Short Term Goal 4 (Week 1): Pt will complete 1/4 steps of donning shirt with multimodal cuing OT Short Term Goal 4 - Progress (Week 1): Not met OT Short Term Goal 5 (Week 1): Pt will transfer to w/c with MAX A of 2 in prep for [facility] transfer OT Short Term Goal 5 - Progress (Week 1): Not met Week 2:  OT Short Term Goal 1 (Week 2): Pt will complete 1/4 steps of donning shirt with multimodal cuing. OT Short Term Goal 2 (Week 2): Pt will locate 2/2 grooming items on L side of sink with min VCs. OT Short Term Goal 3 (Week 2): Pt will maintaining sitting balance for 5 minutes on EOB with mod A for self care tasks.  Skilled Therapeutic Interventions/Progress Updates:    1:1 Pt received in bed. Pt with constant movement (tapping the bed with right hand). Rolling on to right side for weight bearing input to help "rest" the movement. Did result in decreasing the excessive/ nonpurposeful movement. In sidelying focused on tracking to midline and slightly to the left (with decr stimulus on the right by lying on the right side). Pt able to track slightly to the left of midline but difficulty with maintaining- pt also with decr attention to task requiring max cuing.   Transitioned to sitting EOB with max A with excessive  pushing posteriorly through trunk and to the right. Pt benefited from coming down on right elbow on tech's lap as a cue to return to midline. Pt able to return to this position with cues throughout session.   Engaged in functional dressing at EOB with focus on following through with 1 step directions. Pt continues to demonstrate preservative movements in right hand - making it difficulty to motorically complete task with right hand. Pt benefited from backwards chaining with extra time to motorically respond. Pt able to verbalized what she had been asked but required more than reasonable time to respond.   Total A required to perform all tasks and 2 person present for safety in dynamic sitting position.   Sit to stand with total A for pull up pants with total A with extensor position present in trunk.   Slide board transfer with total A +2 into tilt in space w/c  Therapy Documentation Precautions:  Precautions Precautions: Fall, Other (comment) Precaution Comments: L hemi with hypertonia and L inattention Restrictions Weight Bearing Restrictions: No    Pain: No c/o pain in session   Therapy/Group: Individual Therapy  Willeen Cass [facility] 04/23/2020, 6:51 PM

## 2020-04-23 NOTE — Progress Notes (Signed)
Physical Therapy Session Note  Patient Details  [name] MRN: [id number] Date of Birth: July 17, 1946  Today's Date: 04/23/2020 PT Individual Time: 1403-1500 PT Individual Time Calculation (min): 57 min   Short Term Goals: Week 2:  PT Short Term Goal 1 (Week 2): Pt will demonstrate ability to maintain static sitting EOB with close supervision for 5 minutes PT Short Term Goal 2 (Week 2): Pt will perform supine<>sit with max assist of 1 PT Short Term Goal 3 (Week 2): pt to demonstrate transfer to/from bed<>chair at mod A x1  Skilled Therapeutic Interventions/Progress Updates:    pt received in [facility] with family present and agreeable to therapy. Pt lead to therapy gym in [facility] for time management. Pt directed in Slideboard transfer from [facility] to mat table with total assist x2 with extra time given to promote pt's ability to initiate and follow commands to participate however pt unable to during this time. Pt then required max A for all positioning and mod A-max A x1 for maintaining positioning at edge of mat table for trunk extension, midline trunk and head, BLE placement and BUE placement. Pt directed in static sitting balance with step up for improved foot placement positioned at max A-mod A for ~25 mins with pt requiring max -mod VC throughout for posture and positioning as pt continues to demonstrate posterior and L side pushing and LOB. Pt benefited from manual facilitation however with visual cues and verbal cues pt able to initiate attempts to improve posture but ultimately still needed max A-mod A to right and return to midline despite mirror for feedback. Pt also directed in R, lateral/forward reaching x5 with max VC and extra time to complete to attempt to improve sitting position. Pt lead in transfer from mat table to [facility] with maxi move lift with total assist required to place lift pad. Pt then transferred from [facility] to supine in bed with maxi move and required max A x2 for all positioning in  bed. Pt left in room with bed alarm set. All needs in reach and in good condition. Call light in hand.    Therapy Documentation Precautions:  Precautions Precautions: Fall, Other (comment) Precaution Comments: L hemi with hypertonia and L inattention Restrictions Weight Bearing Restrictions: No   Therapy/Group: Individual Therapy  Golden Pop 04/23/2020, 4:32 PM

## 2020-04-23 NOTE — Progress Notes (Signed)
Initial Nutrition Assessment  DOCUMENTATION CODES:   Not applicable  INTERVENTION:   - Magic cup TID with meals, each supplement provides 290 kcal and 9 grams of protein  - Continue MVI with minerals daily  NUTRITION DIAGNOSIS:   Increased nutrient needs related to other (therapies) as evidenced by estimated needs.  GOAL:   Patient will meet greater than or equal to 90% of their needs  MONITOR:   PO intake, Supplement acceptance, Diet advancement, Labs, Weight trends, Skin  REASON FOR ASSESSMENT:   Consult Assessment of nutrition requirement/status  ASSESSMENT:   74 year old female with PMH of T2DM complicated by retinopathy and HTN. Pt presented on 03/23/20 with dysarthria and left hemiparesis. CT of the head was negative and TPA was administered. 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 Pt underwent cerebral angiogram with revascularization by IR and did require short-term intubation. Pt started on dysphagia 1 diet with honey-thick liquids and nasogastric tube removed 04/05/20.   7/14 - diet advanced to dysphagia 1, nectar-thick liquids  Spoke with pt and husband at bedside. RN in room providing nursing care.  Pt's husband reports that pt's appetite has been great. Noted ~90% completed lunch meal tray at bedside. Pt and husband note that pt really enjoys the [employer] with meal trays.  Pt's husband reports that he has not noticed that pt has experienced any weight loss. Reviewed weights in chart. Weight stable during this admission with fluctuations between 160-170 lbs. Reviewed weight history. Pt with a 4.7 kg weight loss since 08/15/19. This is a 5.9% weight loss which is not significant for timeframe.  Will continue with current supplement regimen at this time.  Meal Completion: 25-100% x last 8 meals (averaging 76%)  Medications reviewed and include: dulcolax, SSI, Levemir 20 units BID, Metformin, MVI with  minerals daily, protonix, Klor-con 10 mEq daily, senna IVF: 1/2NS @ 75 ml/hr  Labs reviewed. CBG's: 128-282 x 24 hours  UOP: 800 ml x 24 hours  NUTRITION - FOCUSED PHYSICAL EXAM:    Most Recent Value  Orbital Region Mild depletion  Upper Arm Region No depletion  Thoracic and Lumbar Region No depletion  Buccal Region No depletion  Temple Region Mild depletion  Clavicle Bone Region Mild depletion  Clavicle and Acromion Bone Region Mild depletion  Scapular Bone Region No depletion  Dorsal Hand Mild depletion  Patellar Region No depletion  Anterior Thigh Region Mild depletion  Posterior Calf Region Mild depletion  Edema (RD Assessment) None  Hair Reviewed  Eyes Reviewed  Mouth Reviewed  Skin Reviewed  Nails Reviewed       Diet Order:   Diet Order            DIET - DYS 1 Room service appropriate? Yes with Assist; Fluid consistency: Nectar Thick  Diet effective now                 EDUCATION NEEDS:   No education needs have been identified at this time  Skin:  Skin Assessment: Skin Integrity Issues: Stage I: buttocks Incisions: chest, groin  Last BM:  04/22/20  Height:   Ht Readings from Last 1 Encounters:  04/11/20 5\' 7"  (1.702 m)    Weight:   Wt Readings from Last 1 Encounters:  04/21/20 74.7 kg    Ideal Body Weight:  61.4 kg  BMI:  Body mass index is 25.79 kg/m.  Estimated Nutritional Needs:   Kcal:  1800-2000  Protein:  85-100 grams  Fluid:  >/= 1.8 L    Gaynell Face, MS, RD, LDN Inpatient Clinical Dietitian Please see AMiON for contact information.

## 2020-04-24 ENCOUNTER — Inpatient Hospital Stay (HOSPITAL_COMMUNITY): Payer: Medicare Other | Admitting: Physical Therapy

## 2020-04-24 ENCOUNTER — Inpatient Hospital Stay (HOSPITAL_COMMUNITY): Payer: Medicare Other | Admitting: Occupational Therapy

## 2020-04-24 ENCOUNTER — Inpatient Hospital Stay (HOSPITAL_COMMUNITY): Payer: Medicare Other | Admitting: Speech Pathology

## 2020-04-24 LAB — GLUCOSE, CAPILLARY
Glucose-Capillary: 149 mg/dL — ABNORMAL HIGH (ref 70–99)
Glucose-Capillary: 268 mg/dL — ABNORMAL HIGH (ref 70–99)
Glucose-Capillary: 255 mg/dL — ABNORMAL HIGH (ref 70–99)
Glucose-Capillary: 293 mg/dL — ABNORMAL HIGH (ref 70–99)

## 2020-04-24 NOTE — Progress Notes (Signed)
Patient ID: Meredith Shaw, female   DOB: 05-06-1946, 73 y.o.   MRN: [id number]   [facility] has declined patient due to no bed availability.

## 2020-04-24 NOTE — Progress Notes (Signed)
Physical Therapy Session Note  Patient Details  [name] MRN: [id number] Date of Birth: 07/06/1946  Today's Date: 04/24/2020 PT Individual Time: 1106-1200 PT Individual Time Calculation (min): 54 min   Short Term Goals: Week 2:  PT Short Term Goal 1 (Week 2): Pt will demonstrate ability to maintain static sitting EOB with close supervision for 5 minutes PT Short Term Goal 2 (Week 2): Pt will perform supine<>sit with max assist of 1 PT Short Term Goal 3 (Week 2): pt to demonstrate transfer to/from bed<>chair at mod A x1  Skilled Therapeutic Interventions/Progress Updates: Pt presented in [facility] agreeable to therapy. Pt c/o neck pain, performed gentle ROM L rotation and tilt WLP. PTA encouraged scanning to L before leaving room asking pt to describe flowers (mainly sunflower), pt continuously stating tulip and noted not scanning to L. Pt was able to track PTA's finger across midline this session however no initiation to turn head. Pt then transported to rehab gym and Haley PT assisting in pt participating in scanning and reaching/placing clothespins on rods. Pt was able to grab and place 17 pins of varying strengths (yellow through blue) with RUE placed in front and on clothing. Pt required max mulitmodal cues for scanning midline and modA initially for reaching to place clothespins particularly when placing in midline or to L. Pt then transported to day room and participated in Cybex Kinetron 90cm/sec with PTA initially providing assist for LLE then pt demonstrated some activation on LLE and was able to push pedal approx 3 in down without assist. Pt performed 10 cycles x 3. Pt transported back to room at end of session and performed SB transfer max/total A x 2. Pt positioned to comfort and left with bed alarm on, call bell within reach and needs met.      Therapy Documentation Precautions:  Precautions Precautions: Fall, Other (comment) Precaution Comments: L hemi with hypertonia and L  inattention Restrictions Weight Bearing Restrictions: No General:   Vital Signs: Therapy Vitals Pulse Rate: 88 BP: 140/76 Pain:   Mobility:   Locomotion :    Trunk/Postural Assessment :    Balance:   Exercises:   Other Treatments:      Therapy/Group: Individual Therapy    04/24/2020, 12:45 PM  

## 2020-04-24 NOTE — Progress Notes (Signed)
Patient ID: Meredith Shaw, female   DOB: [birth year], 74 y.o.   MRN: [id number]  Patient has received bed offer from [facility]. Daughter preferred SNF

## 2020-04-24 NOTE — Progress Notes (Signed)
Physical Therapy Session Note  Patient Details  [name] MRN: [id number] Date of Birth: [birth year]  Today's Date: 04/24/2020 PT Individual Time: 0905-1000 PT Individual Time Calculation (min): 55 min   Short Term Goals: Week 2:  PT Short Term Goal 1 (Week 2): Pt will demonstrate ability to maintain static sitting EOB with close supervision for 5 minutes PT Short Term Goal 2 (Week 2): Pt will perform supine<>sit with max assist of 1 PT Short Term Goal 3 (Week 2): pt to demonstrate transfer to/from bed<>chair at mod A x1  Skilled Therapeutic Interventions/Progress Updates:   Pt received supine in bed and agreeable to PT. PT assisted pt in donned pants on bed level. Max assist for rolling R and L for PT to pull pants to waist. Supine NMR to perform rolling to the R and max assist/cues for facilitate initiation of rotation at pelvis and L shoulder x 5 to side and back to supine Supine>sit transfer with total A through log roll, but noted to have improve initiation of movement in the L shoulder girdle. '  Sitting balance EOB x 10 minutes with min-mod assist and donning shirt with total A +2. Once shirt in place, pt requiring max assist to prevent posterior LOB. Lateral scoot/squat pivot transfer to [facility] with total A and  L knee blocked. PT facilitated anterior weight shift and stabilized L foot. No Flexor tone noted in the LLE.   Visual scanning  The L to locate 1-3 targets and verbalize color and number on target. Pt able to track targets if placed at midline then moved L, but noted not to break midline spontaneously.   Pt engaged in visual scanning and trunkal NMR task of dynavision, program A, WC level 3 rings. Max cues to locate objects to the L of midline and max assist to initiate and complete adequate trunk/hip flexion to reach targets.   Patient returned to room and left sitting in [facility] with call bell in reach and all needs met.           Therapy Documentation Precautions:   Precautions Precautions: Fall, Other (comment) Precaution Comments: L hemi with hypertonia and L inattention Restrictions Weight Bearing Restrictions: No    Vital Signs: Therapy Vitals Pulse Rate: 88 BP: 140/76 Pain: denies  Therapy/Group: Individual Therapy  Lorie Phenix 04/24/2020, 10:35 AM

## 2020-04-24 NOTE — Progress Notes (Signed)
Speech Language Pathology Daily Session Note  Patient Details  [name] MRN: [id number] Date of Birth: 1946-03-04  Today's Date: 04/24/2020 SLP Individual Time: 1445-1510 SLP Individual Time Calculation (min): 25 min  Short Term Goals: Week 2: SLP Short Term Goal 1 (Week 2): Pt will consume dys 1 textures and nectar thick liquids with min assist for use of swallowing strategies to mitigate left anterior spillage of boluses. SLP Short Term Goal 1 - Progress (Week 2): Updated due to goal met SLP Short Term Goal 2 (Week 2): Pt will consume therapeutic trials of dys 2 solids with min assist cues for use of swallowing strategies to clear boluses from the oral cavity. SLP Short Term Goal 2 - Progress (Week 2): Not met SLP Short Term Goal 3 (Week 2): Pt will consume therapeutic trials of thin liquids over 3 consecutive sessions with out respiratory decompensation prior to repeat MBS. SLP Short Term Goal 3 - Progress (Week 2): Met SLP Short Term Goal 4 (Week 2): Pt sill sustain her attention to basic familiar tasks for 3 minute intervals with min assist multimodal cues for redirection. SLP Short Term Goal 5 (Week 2): Pt will complete basic, familiar tasks with mod assist  multimodal cues for functional problem solving. SLP Short Term Goal 6 (Week 2): Pt will locate items to the left of midline during basic, familiar tasks with mod assist multimodal cues.  Skilled Therapeutic Interventions: Skilled treatment session focused on dysphagia and cognitive goals. SLP facilitated session by providing set-up assist and Mod verbal cues for problem solving during oral care via the suction toothbrush. Patient consumed trials of thin liquids via tsp without overt s/s of aspiration and demonstrated what appeared to be a timely swallow initiation. Patient appeared more awake today compared to yesterday and recalled events from previous therapy sessions with extra time and Mod verbal cues. Patient left  upright in bed with alarm on and all needs within reach. Continue with current plan of care.      Pain No/Denies Pain   Therapy/Group: Individual Therapy  Ilsa Bonello 04/24/2020, 3:24 PM

## 2020-04-24 NOTE — Progress Notes (Signed)
[place] PHYSICAL MEDICINE & REHABILITATION PROGRESS NOTE   Subjective/Complaints:  No issues overnite   ROS: limited due to cognition.   Objective:   DG Swallowing Func-Speech Pathology  Result Date: 04/23/2020 Objective Swallowing Evaluation: Type of Study: MBS-Modified Barium Swallow Study  Patient Details [name] MRN: [id number] Date of Birth: [birth year] Today's Date: 04/23/2020 Time: SLP Start Time (ACUTE ONLY): 0935 -SLP Stop Time (ACUTE ONLY): 1000 SLP Time Calculation (min) (ACUTE ONLY): 25 min Past Medical History: Past Medical History: Diagnosis Date  Arthritis   OA AND PAIN BOTH KNEES -RIGHT KNEE HURTS WORSE THAN LEFT  Cataract   Diabetes mellitus   ORAL MEDICATION - NO INSULIN  Hypertension   Hypertensive retinopathy   OU  Hypothyroidism   Kidney stones   NONE AT PRESENT TIME THAT PT IS AWARE OF  Osteoporosis  Past Surgical History: Past Surgical History: Procedure Laterality Date  ABDOMINAL HYSTERECTOMY    BUBBLE STUDY  03/26/2020  Procedure: BUBBLE STUDY;  Surgeon: Lars MassonNelson, Katarina H, MD;  [facility];  Service: Cardiovascular;;  CATARACT EXTRACTION    CHOLECYSTECTOMY  1972  COLONOSCOPY    EYE SURGERY    RIGHT CATARACT EXTRACTON WITH LENS IMPLANT  GROWTH REMOVED FROM THYROID    INTRAMEDULLARY (IM) NAIL INTERTROCHANTERIC Left 02/10/2018  Procedure: INTRAMEDULLARY (IM) NAIL INTERTROCHANTRIC;  Surgeon: Kathryne HitchBlackman, Christopher Y, MD;  Location: [facility] OR;  Service: Orthopedics;  Laterality: Left;  INTRAMEDULLARY (IM) NAIL INTERTROCHANTERIC Right 08/20/2018  Procedure: ORIF RIGHT HIP INTERTROC FRACTURE;  Surgeon: Kathryne HitchBlackman, Christopher Y, MD;  Location: [facility] OR;  Service: Orthopedics;  Laterality: Right;  IR CT HEAD LTD  03/24/2020  IR PERCUTANEOUS ART THROMBECTOMY/INFUSION INTRACRANIAL INC DIAG ANGIO  03/24/2020  left hip surgery   02/2018  LOOP RECORDER INSERTION N/A 04/11/2020  Procedure: LOOP RECORDER INSERTION;  Surgeon: Regan Lemmingamnitz, Will Martin, MD;  Location:  [facility] INVASIVE CV LAB;  Service: Cardiovascular;  Laterality: N/A;  ORIF HIP FRACTURE Right 08/20/2018  PERCUTANEOUS NEPHROLITHOTOMY    RADIOLOGY WITH ANESTHESIA N/A 03/23/2020  Procedure: IR WITH ANESTHESIA;  Surgeon: Julieanne Cottoneveshwar, Sanjeev, MD;  Location: [facility] OR;  Service: Radiology;  Laterality: N/A;  TEE WITHOUT CARDIOVERSION N/A 03/26/2020  Procedure: TRANSESOPHAGEAL ECHOCARDIOGRAM (TEE);  Surgeon: Lars MassonNelson, Katarina H, MD;  [facility];  Service: Cardiovascular;  Laterality: N/A;  TOTAL KNEE ARTHROPLASTY  06/09/2012  Procedure: TOTAL KNEE ARTHROPLASTY;  Surgeon: Kathryne Hitchhristopher Y Blackman, MD;  Location: [facility] ORS;  Service: Orthopedics;  Laterality: Right;  Right Total Knee Arthroplasty  TOTAL KNEE ARTHROPLASTY Left 04/09/2014  Procedure: LEFT TOTAL KNEE ARTHROPLASTY;  Surgeon: Kathryne Hitchhristopher Y Blackman, MD;  [facility];  Service: Orthopedics;  Laterality: Left;  TUBAL LIGATION   HPI: See H&P  Subjective: Pt awake, alert, pleasant, participative Assessment / Plan / Recommendation CHL IP CLINICAL IMPRESSIONS 04/23/2020 Clinical Impression Patient presents with mildly improved swallowing function since previous MBS but severe lethargy and cognitive impairments continue to impact her overall swallowing function. Oral phase is marked by left anterior spillage, prolonged AP transit and impaired mastication with all solids and liquids. Patient consistently triggered the swallow at the pyriform sinuses with both nectar-thick and thin liquids resulting in consistent deep penetration and one episode of silent aspiration with thin liquids. Nectar-thick liquids via straw were not penetrated. Due to patients ongoing lethargy, cognitive impairments, and limited mobility, recommend patient remain on Dys. 1 textures but upgrade to nectar-thick liquids via straw. Patients daughter present during study and educated on results and recommendations. She verbalized understanding and agreement. SLP Visit  Diagnosis Dysphagia,  oropharyngeal phase (R13.12) Attention and concentration deficit following -- Frontal lobe and executive function deficit following -- Impact on safety and function Mild aspiration risk;Moderate aspiration risk   CHL IP TREATMENT RECOMMENDATION 04/23/2020 Treatment Recommendations Therapy as outlined in treatment plan below   Prognosis 04/23/2020 Prognosis for Safe Diet Advancement Good Barriers to Reach Goals Cognitive deficits Barriers/Prognosis Comment -- CHL IP DIET RECOMMENDATION 04/23/2020 SLP Diet Recommendations Dysphagia 1 (Puree) solids;Nectar thick liquid Liquid Administration via Straw Medication Administration Crushed with puree Compensations Monitor for anterior loss;Small sips/bites;Slow rate;Follow solids with liquid;Minimize environmental distractions Postural Changes Seated upright at 90 degrees   CHL IP OTHER RECOMMENDATIONS 04/23/2020 Recommended Consults -- Oral Care Recommendations Oral care BID Other Recommendations Order thickener from pharmacy;Prohibited food (jello, ice cream, thin soups);Remove water pitcher;Have oral suction available   CHL IP FOLLOW UP RECOMMENDATIONS 04/23/2020 Follow up Recommendations Inpatient Rehab   CHL IP FREQUENCY AND DURATION 04/23/2020 Speech Therapy Frequency (ACUTE ONLY) min 3x week Treatment Duration 3 weeks      CHL IP ORAL PHASE 04/23/2020 Oral Phase Impaired Oral - Pudding Teaspoon -- Oral - Pudding Cup -- Oral - Honey Teaspoon Delayed oral transit;Left anterior bolus loss Oral - Honey Cup NT Oral - Nectar Teaspoon Delayed oral transit;Left anterior bolus loss;Decreased bolus cohesion;Lingual/palatal residue;Weak lingual manipulation Oral - Nectar Cup Weak lingual manipulation;Left anterior bolus loss;Decreased bolus cohesion;Delayed oral transit;Lingual/palatal residue Oral - Nectar Straw Lingual/palatal residue;Decreased bolus cohesion;Delayed oral transit;Left anterior bolus loss;Weak lingual manipulation Oral - Thin Teaspoon Left anterior bolus loss;Delayed  oral transit;Decreased bolus cohesion;Lingual/palatal residue;Weak lingual manipulation Oral - Thin Cup -- Oral - Thin Straw Left anterior bolus loss;Lingual/palatal residue;Delayed oral transit;Decreased bolus cohesion;Weak lingual manipulation Oral - Puree Left anterior bolus loss;Delayed oral transit;Decreased bolus cohesion;Lingual/palatal residue;Left pocketing in lateral sulci Oral - Mech Soft Left pocketing in lateral sulci;Delayed oral transit;Decreased bolus cohesion;Lingual/palatal residue;Impaired mastication;Left anterior bolus loss Oral - Regular -- Oral - Multi-Consistency -- Oral - Pill -- Oral Phase - Comment --  CHL IP PHARYNGEAL PHASE 04/23/2020 Pharyngeal Phase Impaired Pharyngeal- Pudding Teaspoon -- Pharyngeal -- Pharyngeal- Pudding Cup -- Pharyngeal -- Pharyngeal- Honey Teaspoon Delayed swallow initiation-vallecula Pharyngeal Material does not enter airway Pharyngeal- Honey Cup NT Pharyngeal -- Pharyngeal- Nectar Teaspoon Delayed swallow initiation-pyriform sinuses Pharyngeal Material does not enter airway Pharyngeal- Nectar Cup Delayed swallow initiation-pyriform sinuses;Penetration/Aspiration during swallow;Reduced airway/laryngeal closure Pharyngeal Material enters airway, remains ABOVE vocal cords and not ejected out Pharyngeal- Nectar Straw Delayed swallow initiation-pyriform sinuses Pharyngeal -- Pharyngeal- Thin Teaspoon Delayed swallow initiation-pyriform sinuses;Reduced airway/laryngeal closure;Penetration/Aspiration during swallow Pharyngeal Material enters airway, remains ABOVE vocal cords and not ejected out Pharyngeal- Thin Cup NT Pharyngeal -- Pharyngeal- Thin Straw Delayed swallow initiation-pyriform sinuses;Penetration/Aspiration during swallow Pharyngeal Material enters airway, CONTACTS cords and not ejected out Pharyngeal- Puree WFL Pharyngeal -- Pharyngeal- Mechanical Soft Delayed swallow initiation-vallecula Pharyngeal -- Pharyngeal- Regular -- Pharyngeal -- Pharyngeal-  Multi-consistency -- Pharyngeal -- Pharyngeal- Pill -- Pharyngeal -- Pharyngeal Comment --  No flowsheet data found. PAYNE, COURTNEY 04/23/2020, 3:02 PM    Feliberto Gottron, MA, CCC-SLP [phone]           No results for input(s): WBC, HGB, HCT, PLT in the last 72 hours. Recent Labs    04/22/20 0532  NA 143  K 3.8  CL 105  CO2 28  GLUCOSE 131*  BUN 27*  CREATININE 1.11*  CALCIUM 9.1    Intake/Output Summary (Last 24 hours) at 04/24/2020 0916 Last data filed at 04/24/2020 0700 Gross per 24 hour  Intake 708 ml  Output 1550 ml  Net -842 ml     Physical Exam: Vital Signs Blood pressure (!) 142/79, pulse 95, temperature 98.4 F (36.9 C), temperature source Oral, resp. rate 16, height 5\' 7"  (1.702 m), weight 74.7 kg, SpO2 95 %.   General: No acute distress Mood and affect are appropriate Heart: Regular rate and rhythm no rubs murmurs or extra sounds Lungs: Clear to auscultation, breathing unlabored, no rales or wheezes Abdomen: Positive bowel sounds, soft nontender to palpation, nondistended Extremities: No clubbing, cyanosis, or edema Skin: No evidence of breakdown, no evidence of rash  Psych: Normal mood.  Normal behavior. Musc: No edema in extremities.  No tenderness in extremities. Neurologic: somnolent but awakens to voice  Motor: Left facial weakness.  Motor: LUE/LLE: 0/5 proximal distal  Increased tone LUE shoulder girdle    Assessment/Plan: 1. 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  which require 3+ hours per day of interdisciplinary therapy in a comprehensive inpatient rehab setting.  Physiatrist is providing close team supervision and 24 hour management of active medical problems listed below.  Physiatrist and rehab team continue to assess barriers to discharge/monitor patient progress toward functional and medical goals  Care Tool:  Bathing    Body parts bathed by patient: Abdomen, Face, Chest, Right lower leg   Body parts bathed by helper: Right  arm, Left arm, Front perineal area, Buttocks, Right upper leg, Left upper leg, Left lower leg     Bathing assist Assist Level: Maximal Assistance - Patient 24 - 49%     Upper Body Dressing/Undressing Upper body dressing   What is the patient wearing?: Pull over shirt    Upper body assist Assist Level: Dependent - Patient 0%    Lower Body Dressing/Undressing Lower body dressing      What is the patient wearing?: Pants     Lower body assist Assist for lower body dressing: 2 Helpers     Toileting Toileting Toileting Activity did not occur (Clothing management and hygiene only): N/A (no void or bm)  Toileting assist Assist for toileting: Dependent - Patient 0%     Transfers Chair/bed transfer  Transfers assist  Chair/bed transfer activity did not occur: Safety/medical concerns  Chair/bed transfer assist level: 2 Helpers     Locomotion Ambulation   Ambulation assist   Ambulation activity did not occur: Safety/medical concerns          Walk 10 feet activity   Assist  Walk 10 feet activity did not occur: Safety/medical concerns        Walk 50 feet activity   Assist Walk 50 feet with 2 turns activity did not occur: Safety/medical concerns         Walk 150 feet activity   Assist Walk 150 feet activity did not occur: Safety/medical concerns         Walk 10 feet on uneven surface  activity   Assist Walk 10 feet on uneven surfaces activity did not occur: Safety/medical concerns         Wheelchair     Assist Will patient use wheelchair at discharge?:  (TBD)             Wheelchair 50 feet with 2 turns activity    Assist            Wheelchair 150 feet activity     Assist          Blood pressure (!) 142/79, pulse 95, temperature 98.4 F (36.9 C), temperature  source Oral, resp. rate 16, height 5\' 7"  (1.702 m), weight 74.7 kg, SpO2 95 %.  Medical Problem List and Plan: 1.  Left side hemiplegia, now with  spasticity, with dysarthria and dysphagia secondary to right MCA infarction status post TPA and IR with TIC13 revascularization reoccluded on follow-up, infarct embolic secondary to unknown source.  Status post loop recorder  Continue CIR PT, OT, SLP   WHO ordered  Baclofen 5 BID started on 7/11 2.  Antithrombotics: -DVT/anticoagulation: Subcutaneous heparin             -antiplatelet therapy: Aspirin 81 mg daily 3. Pain Management: Tylenol as needed. Appears to be well controlled.              Monitor for headaches with increased activity. 4. Mood: Amantadine 150 mg daily for stimulation, tremors may be side effects D/Ced             -antipsychotic agents: N/A 5. Neuropsych: This patient is not capable of making decisions on her own behalf. 6. Skin/Wound Care: Routine skin checks 7. Fluids/Electrolytes/Nutrition: Routine in and outs. Protein and albumin are low on BMP 7/13. Ordered dietary consult. 8.  Hypertension.  Lopressor 25 mg twice daily, Norvasc 10 mg daily.    Vitals:   04/23/20 2146 04/24/20 0517  BP: 126/75 (!) 142/79  Pulse: 96 95  Resp: 16   Temp: 98.2 F (36.8 C) 98.4 F (36.9 C)  SpO2: 98% 95%   Lisinopril 2.5 nightly started on 7/10  7/15: well controlled.  9.  Post stroke dysphagia: Dysphagia #1 honey thick liquids.  Patient on IV fluids for hydration follow-up speech therapy             Advance diet as tolerated 10.  Hemorrhagic cystitis complicated by UTI.  Complete course of Ancef.  Follow-up outpatient urology 11.  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  Hemoglobin A1c 8.9.  Check blood sugars before meals and at bedtime CBG (last 3)  Recent Labs    04/23/20 1633 04/23/20 2116 04/24/20 0607  GLUCAP 206* 249* 149*   Metformin 500 daily  7/13: remains elevated increase Levemir to 20U. Monitor effect for 1-2 d             Monitor with increased mobility 12.  Hyperlipidemia.  Zetia/Pravacol 13.  Constipation. MiraLAX. Adjust bowel  meds as necessary   LOS: 13 days A FACE TO FACE EVALUATION WAS PERFORMED  8/13 04/24/2020, 9:16 AM

## 2020-04-24 NOTE — Progress Notes (Signed)
Occupational Therapy Session Note  Patient Details  [name] MRN: [id number] Date of Birth: 1946/04/22  Today's Date: 04/24/2020 OT Individual Time: 1300-1400 OT Individual Time Calculation (min): 60 min    Short Term Goals: Week 2:  OT Short Term Goal 1 (Week 2): Pt will complete 1/4 steps of donning shirt with multimodal cuing. OT Short Term Goal 2 (Week 2): Pt will locate 2/2 grooming items on L side of sink with min VCs. OT Short Term Goal 3 (Week 2): Pt will maintaining sitting balance for 5 minutes on EOB with mod A for self care tasks.  Skilled Therapeutic Interventions/Progress Updates:    Pt received in bed, alert,  And responding well to questions.  Pt able to answer basic questions about her grandchildren, her years of marriage, hobbies, etc without repetition of words.   With +2 A during first 30 min of session, focused on moving from supine to sit with max A of 2. Once sitting pt severely leaning back and to her L.  Attempted multiple tactile cues/ verbal cues to have pt shift weight forward to hold sitting balance but pt kept pushing back.  To facilitate upright posture, tilted bed to angle up, placed pillow under L hip and placed sheet around torso to pull forward on sheet to allow her sit upright and symmetrically.  Pt tolerated this well but as soon as I loosened tension on sheet she would lean back again.    Initially had pt place her R arm on table to her R side but she had great difficulty with attention to task. In sitting, worked on visual scanning but could not get pt to turn her head to the L or scan her eyes.    Pt moved back into supine with +2 A.  Worked on gentle PROM to neck and RUE.  RUE NMR with hand over hand guiding of movement patterns. Sat pt up in bed in chair position to have her engage in a coloring task to work on attention, visual scanning. Pt did attend to task but demonstrated strong L neglect. Would angle the paper and pt continued to  only stay on the R side of the sheet.  She would repetitiously move the pencil back and forth and seemed to have some idea of where to color but was unable to actually color in the shape.   Pt began falling asleep at end of session.  Resting in bed with all needs met. Bed alarm set.   Therapy Documentation Precautions:  Precautions Precautions: Fall, Other (comment) Precaution Comments: L hemi with hypertonia and L inattention Restrictions Weight Bearing Restrictions: No    Vital Signs: Therapy Vitals Temp: 98.4 F (36.9 C) Temp Source: Oral Pulse Rate: 95 BP: (!) 142/79 Patient Position (if appropriate): Lying Oxygen Therapy SpO2: 95 % O2 Device: Room Air Pain: Pain Assessment Pain Scale: 0-10 Pain Score: 0-No pain   Therapy/Group: Individual Therapy  [place] 04/24/2020, 8:55 AM

## 2020-04-25 ENCOUNTER — Inpatient Hospital Stay (HOSPITAL_COMMUNITY): Payer: Medicare Other | Admitting: Physical Therapy

## 2020-04-25 ENCOUNTER — Inpatient Hospital Stay (HOSPITAL_COMMUNITY): Payer: Medicare Other | Admitting: Occupational Therapy

## 2020-04-25 LAB — GLUCOSE, CAPILLARY
Glucose-Capillary: 201 mg/dL — ABNORMAL HIGH (ref 70–99)
Glucose-Capillary: 246 mg/dL — ABNORMAL HIGH (ref 70–99)
Glucose-Capillary: 244 mg/dL — ABNORMAL HIGH (ref 70–99)
Glucose-Capillary: 263 mg/dL — ABNORMAL HIGH (ref 70–99)

## 2020-04-25 MED ORDER — HEPARIN SODIUM (PORCINE) 5000 UNIT/ML IJ SOLN
5000.0000 [IU] | Freq: Two times a day (BID) | INTRAMUSCULAR | Status: DC
  Administered 2020-04-25 – 2020-05-05 (×20): 5000 [IU] via SUBCUTANEOUS
  Filled 2020-04-25 (×20): qty 1

## 2020-04-25 MED ORDER — TIZANIDINE HCL 2 MG PO TABS
1.0000 mg | ORAL_TABLET | Freq: Two times a day (BID) | ORAL | Status: DC
  Administered 2020-04-25 – 2020-05-05 (×21): 1 mg via ORAL
  Filled 2020-04-25 (×21): qty 1

## 2020-04-25 MED ORDER — INSULIN DETEMIR 100 UNIT/ML ~~LOC~~ SOLN
22.0000 [IU] | Freq: Two times a day (BID) | SUBCUTANEOUS | Status: DC
  Administered 2020-04-25 – 2020-04-27 (×4): 22 [IU] via SUBCUTANEOUS
  Filled 2020-04-25 (×5): qty 0.22

## 2020-04-25 MED ORDER — ONDANSETRON HCL 4 MG PO TABS
4.0000 mg | ORAL_TABLET | Freq: Three times a day (TID) | ORAL | Status: DC | PRN
  Administered 2020-04-25 – 2020-04-26 (×2): 4 mg via ORAL
  Filled 2020-04-25 (×3): qty 1

## 2020-04-25 NOTE — Progress Notes (Signed)
Occupational Therapy Session Note  Patient Details  Name: NARYIAH SCHLEY MRN: [id number] Date of Birth: [birth year]  Today's Date: 04/25/2020 OT Individual Time: 1300-1345 OT Individual Time Calculation (min): 45 min    Short Term Goals: Week 1:  OT Short Term Goal 1 (Week 1): Pt will sit EOB/EOM for 5 min with MOD A of 1 for sitting balance during functional activity OT Short Term Goal 1 - Progress (Week 1): Not met OT Short Term Goal 2 (Week 1): Pt will terminate washing face with no VC OT Short Term Goal 2 - Progress (Week 1): Not met OT Short Term Goal 3 (Week 1): Pt will locate 2/2 grooming items on L side of sink wiht min VC OT Short Term Goal 3 - Progress (Week 1): Not met OT Short Term Goal 4 (Week 1): Pt will complete 1/4 steps of donning shirt with multimodal cuing OT Short Term Goal 4 - Progress (Week 1): Not met OT Short Term Goal 5 (Week 1): Pt will transfer to w/c with MAX A of 2 in prep for [facility] transfer OT Short Term Goal 5 - Progress (Week 1): Not met Week 2:  OT Short Term Goal 1 (Week 2): Pt will complete 1/4 steps of donning shirt with multimodal cuing. OT Short Term Goal 2 (Week 2): Pt will locate 2/2 grooming items on L side of sink with min VCs. OT Short Term Goal 3 (Week 2): Pt will maintaining sitting balance for 5 minutes on EOB with mod A for self care tasks.      Skilled Therapeutic Interventions/Progress Updates:    Pt received in w/c continuously moving her R hand up and down.  LUE held into tight flexion.  Pt taken to gym to work on transfers to mat with +2 A with sliding board.  Pt required TOTAL A as she was not able to attend to task, follow directions, maintain visual focus and had a strong posterior lean.  Once on the mat she actually sat with min A for 10 seconds and then pushed posteriorly requiring total A of 2 to prevent her from falling back. Attempted to use multimodal cues to have pt shift her weight forward.  After 5 minutes of constant pushing,  transferred pt back to wc total A of 2 sliding board.    In w.c, worked on PROM with sustained stretches for LUE.  Pt tolerated slow stretches well.  When asked about her R arm constantly moving she said "my arm keeps moving, I want it to stop, but do not know how".  Had pt lace R hand into L hand, but R hand actually picked up the L hand so she has a strong reflex or tic kicking in.    Pt taken back to room, belt alarm on and all needs met.    LONG TERM GOALS ARE BEING DOWNGRADED TODAY AS PT WILL NOT BE ABLE TO REACH A MINIMAL A LEVEL BY DISCHARGE.     Therapy Documentation Precautions:  Precautions Precautions: Fall, Other (comment) Precaution Comments: L hemi with hypertonia and L inattention Restrictions Weight Bearing Restrictions: No    Vital Signs: Therapy Vitals Temp: 97.6 F (36.4 C) Pulse Rate: 85 Resp: 18 BP: 127/80 Patient Position (if appropriate): Sitting Oxygen Therapy SpO2: 97 % O2 Device: Room Air Pain: Pain Assessment Faces Pain Scale: Hurts a little bit (with LUE stretches)      Therapy/Group: Individual Therapy  [place] 04/25/2020, 3:47 PM

## 2020-04-25 NOTE — Progress Notes (Signed)
Speech Language Pathology Weekly Progress Note  Patient Details  [name] MRN: [id number] Date of Birth: Mar 27, 1946  Beginning of progress report period: April 18, 2020 End of progress report period: April 25, 2020  Short Term Goals: Week 2: SLP Short Term Goal 1 (Week 2): Pt will consume dys 1 textures and nectar thick liquids with min assist for use of swallowing strategies to mitigate left anterior spillage of boluses. SLP Short Term Goal 1 - Progress (Week 2): Met SLP Short Term Goal 2 (Week 2): Pt will consume therapeutic trials of dys 2 solids with min assist cues for use of swallowing strategies to clear boluses from the oral cavity. SLP Short Term Goal 2 - Progress (Week 2): Not met SLP Short Term Goal 3 (Week 2): Pt will consume therapeutic trials of thin liquids over 3 consecutive sessions with out respiratory decompensation prior to repeat MBS. SLP Short Term Goal 3 - Progress (Week 2): Met SLP Short Term Goal 4 (Week 2): Pt sill sustain her attention to basic familiar tasks for 3 minute intervals with min assist multimodal cues for redirection. SLP Short Term Goal 4 - Progress (Week 2): Met SLP Short Term Goal 5 (Week 2): Pt will complete basic, familiar tasks with mod assist  multimodal cues for functional problem solving. SLP Short Term Goal 5 - Progress (Week 2): Not met SLP Short Term Goal 6 (Week 2): Pt will locate items to the left of midline during basic, familiar tasks with mod assist multimodal cues. SLP Short Term Goal 6 - Progress (Week 2): Not met    New Short Term Goals: Week 3: SLP Short Term Goal 1 (Week 3): STGs=LTGs due to ELOS  Weekly Progress Updates: Patient has made functional gains and has met 3 of 3 STGs this reporting period. Patient had a repeat MBS this week and has been upgraded to nectar-thick liquids due to ongoing deep, silent penetration of thin liquids. Patient is consuming her upgraded diet with minimal overt s/s of aspiration and  overall Min A verbal cues for use of swallowing compensatory strategies. Patient demonstrates improved cognitive functioning and requires overall Mod-Max A verbal and visual cues to complete functional and familiar tasks safely in regards to attention, visual scanning, problem solving and recall. Patient and family education ongoing. Patient would benefit from continued skilled SLP intervention to maximize her cognitive and swallowing function prior to discharge.      Intensity: Minumum of 1-2 x/day, 30 to 90 minutes Frequency: 3 to 5 out of 7 days Duration/Length of Stay: 7/20 Treatment/Interventions: Cognitive remediation/compensation;Cueing hierarchy;Environmental controls;Dysphagia/aspiration precaution training;Internal/external aids;Speech/Language facilitation;Patient/family education   [place]on [place] 04/25/2020, 3:42 PM

## 2020-04-25 NOTE — Plan of Care (Signed)
Problem: RH Balance Goal: LTG: Patient will maintain dynamic sitting balance (OT) Description: LTG:  Patient will maintain dynamic sitting balance with assistance during activities of daily living (OT) Flowsheets (Taken 04/25/2020 1622) LTG: Pt will maintain dynamic sitting balance during ADLs with: (downgraded from S) Maximal Assistance - Patient 25 - 49% Note: LTG downgraded due to severely impaired sensory(proprioceptive), perceptual (L neglect, spatial and body awareness), cognitive and postural control (posterior and L lean).    Problem: Sit to Stand Goal: LTG:  Patient will perform sit to stand in prep for activites of daily living with assistance level (OT) Description: LTG:  Patient will perform sit to stand in prep for activites of daily living with assistance level (OT) Flowsheets (Taken 04/25/2020 1622) LTG: PT will perform sit to stand in prep for activites of daily living with assistance level: (downgraded from min A)  2 Helpers  Total Assistance - Patient < 25% Note: LTG downgraded due to severely impaired sensory(proprioceptive), perceptual (L neglect, spatial and body awareness), cognitive and postural control (posterior and L lean).    Problem: RH Eating Goal: LTG Patient will perform eating w/assist, cues/equip (OT) Description: LTG: Patient will perform eating with assist, with/without cues using equipment (OT) Flowsheets (Taken 04/25/2020 1622) LTG: Pt will perform eating with assistance level of: (downgraded from S) Moderate Assistance - Patient 50 - 74% Note: LTG downgraded due to severely impaired sensory(proprioceptive), perceptual (L neglect, spatial and body awareness), cognitive and postural control (posterior and L lean).    Problem: RH Grooming Goal: LTG Patient will perform grooming w/assist,cues/equip (OT) Description: LTG: Patient will perform grooming with assist, with/without cues using equipment (OT) Flowsheets (Taken 04/25/2020 1622) LTG: Pt will perform  grooming with assistance level of: (downgraded from S) Moderate Assistance - Patient 50 - 74% Note: LTG downgraded due to severely impaired sensory(proprioceptive), perceptual (L neglect, spatial and body awareness), cognitive and postural control (posterior and L lean).    Problem: RH Bathing Goal: LTG Patient will bathe all body parts with assist levels (OT) Description: LTG: Patient will bathe all body parts with assist levels (OT) Flowsheets (Taken 04/25/2020 1622) LTG: Pt will perform bathing with assistance level/cueing: (downgraded from min A)  2 helpers  Maximal Assistance - Patient 25 - 49% Note: LTG downgraded due to severely impaired sensory(proprioceptive), perceptual (L neglect, spatial and body awareness), cognitive and postural control (posterior and L lean).    Problem: RH Dressing Goal: LTG Patient will perform upper body dressing (OT) Description: LTG Patient will perform upper body dressing with assist, with/without cues (OT). Flowsheets (Taken 04/25/2020 1622) LTG: Pt will perform upper body dressing with assistance level of: (downgraded from min A) Maximal Assistance - Patient 25 - 49% Note: LTG downgraded due to severely impaired sensory(proprioceptive), perceptual (L neglect, spatial and body awareness), cognitive and postural control (posterior and L lean).  Goal: LTG Patient will perform lower body dressing w/assist (OT) Description: LTG: Patient will perform lower body dressing with assist, with/without cues in positioning using equipment (OT) Flowsheets (Taken 04/25/2020 1622) LTG: Pt will perform lower body dressing with assistance level of: (downgraded from mod A)  Dependent - Patient equals 0%  2 helpers Note: LTG downgraded due to severely impaired sensory(proprioceptive), perceptual (L neglect, spatial and body awareness), cognitive and postural control (posterior and L lean).    Problem: RH Toileting Goal: LTG Patient will perform toileting task (3/3 steps)  with assistance level (OT) Description: LTG: Patient will perform toileting task (3/3 steps) with assistance level (OT)  Flowsheets (  Taken 04/25/2020 1622) LTG: Pt will perform toileting task (3/3 steps) with assistance level: (discontinued due to lack of progress and incontinence) -- Note: LTG discontinued due to severely impaired sensory(proprioceptive), perceptual (L neglect, spatial and body awareness), cognitive and postural control (posterior and L lean).    Problem: RH Functional Use of Upper Extremity Goal: LTG Patient will use RT/LT upper extremity as a (OT) Description: LTG: Patient will use right/left upper extremity as a stabilizer/gross assist/diminished/nondominant/dominant level with assist, with/without cues during functional activity (OT) Flowsheets (Taken 04/25/2020 1622) LTG: Pt will use upper extremity in functional activity with assistance level of: (downgraded from S) Maximal Assistance - Patient 25 - 49% Note: LTG downgraded due to severely impaired sensory(proprioceptive), perceptual (L neglect, spatial and body awareness), cognitive and postural control (posterior and L lean).    Problem: RH Toilet Transfers Goal: LTG Patient will perform toilet transfers w/assist (OT) Description: LTG: Patient will perform toilet transfers with assist, with/without cues using equipment (OT) Flowsheets (Taken 04/25/2020 1622) LTG: Pt will perform toilet transfers with assistance level of: (discontinued due to lack of progress and unsafe to sit on [facility]) -- Note: LTG discontinued due to severely impaired sensory(proprioceptive), perceptual (L neglect, spatial and body awareness), cognitive and postural control (posterior and L lean).    Problem: RH Tub/Shower Transfers Goal: LTG Patient will perform tub/shower transfers w/assist (OT) Description: LTG: Patient will perform tub/shower transfers with assist, with/without cues using equipment (OT) Flowsheets (Taken 04/25/2020 1622) LTG: Pt will  perform tub/shower stall transfers with assistance level of: (discontinued due to lack of progress, unsafe transfer) -- Note: LTG discontinued due to severely impaired sensory(proprioceptive), perceptual (L neglect, spatial and body awareness), cognitive and postural control (posterior and L lean).    Problem: RH Memory Goal: LTG Patient will demonstrate ability for day to day recall/carry over during activities of daily living with assistance level (OT) Description: LTG:  Patient will demonstrate ability for day to day recall/carry over during activities of daily living with assistance level (OT). Flowsheets (Taken 04/25/2020 1622) LTG:  Patient will demonstrate ability for day to day recall/carry over during activities of daily living with assistance level (OT): Maximal Assistance - Patient 25 - 49%   Problem: RH Attention Goal: LTG Patient will demonstrate this level of attention during functional activites (OT) Description: LTG:  Patient will demonstrate this level of attention during functional activites  (OT) Flowsheets (Taken 04/25/2020 1622) LTG: Patient will demonstrate this level of attention during functional activites (OT): Maximal Assistance - Patient 25 - 49%   Problem: RH Awareness Goal: LTG: Patient will demonstrate awareness during functional activites type of (OT) Description: LTG: Patient will demonstrate awareness during functional activites type of (OT) Flowsheets (Taken 04/25/2020 1622) LTG: Patient will demonstrate awareness during functional activites type of (OT): Maximal Assistance - Patient 25 - 49%

## 2020-04-25 NOTE — Plan of Care (Signed)
  Problem: Consults Goal: RH STROKE PATIENT EDUCATION Description: See Patient Education module for education specifics  Outcome: Progressing Goal: Diabetes Guidelines if Diabetic/Glucose > 140 Description: If diabetic or lab glucose is > 140 mg/dl - Initiate Diabetes/Hyperglycemia Guidelines & Document Interventions  Outcome: Progressing   Problem: RH BOWEL ELIMINATION Goal: RH STG MANAGE BOWEL WITH ASSISTANCE Description: STG Manage Bowel with min assist Outcome: Progressing   Problem: RH BLADDER ELIMINATION Goal: RH STG MANAGE BLADDER WITH ASSISTANCE Description: STG Manage Bladder With min assist Outcome: Progressing   Problem: RH SKIN INTEGRITY Goal: RH STG MAINTAIN SKIN INTEGRITY WITH ASSISTANCE Description: STG Maintain Skin Integrity With mod I assist Outcome: Progressing   Problem: RH SAFETY Goal: RH STG ADHERE TO SAFETY PRECAUTIONS W/ASSISTANCE/DEVICE Description: STG Adhere to Safety Precautions With cues and reminders Outcome: Progressing   Problem: RH COGNITION-NURSING Goal: RH STG ANTICIPATES NEEDS/CALLS FOR ASSIST W/ASSIST/CUES Description: STG Anticipates Needs/Calls for Assist With cues and reminders Outcome: Progressing   Problem: RH PAIN MANAGEMENT Goal: RH STG PAIN MANAGED AT OR BELOW PT'S PAIN GOAL Description: Pain level less than 4 on scale of 0-10 Outcome: Progressing   Problem: RH KNOWLEDGE DEFICIT Goal: RH STG INCREASE KNOWLEDGE OF DIABETES Description: Pt will be able to adhere to medication regimen and dietary modification to better control blood glucose levels with min assist.  Outcome: Progressing Goal: RH STG INCREASE KNOWLEDGE OF HYPERTENSION Description: Pt will be able to adhere to medication regimen and dietary modification to better control blood pressure with min assist using handouts and booklets provided Outcome: Progressing Goal: RH STG INCREASE KNOWLEGDE OF HYPERLIPIDEMIA Description: Pt will be able to adhere to medication  regimen and dietary modification to better control blood cholesterol levels with min assist using booklets and handouts provided.  Outcome: Progressing Goal: RH STG INCREASE KNOWLEDGE OF STROKE PROPHYLAXIS Description: Pt will be able to adhere to medication regimen and dietary modification to better control blood pressure and prevent stroke with min assist using handouts and booklets provided.  Outcome: Progressing   

## 2020-04-25 NOTE — Progress Notes (Signed)
Physical Therapy Session Note  Patient Details  Name: ANNALEE MEYERHOFF MRN: [id number] Date of Birth: 1946/02/16  Today's Date: 04/25/2020 PT Individual Time: 1520-1615 PT Individual Time Calculation (min): 55 min   Short Term Goals: Week 2:  PT Short Term Goal 1 (Week 2): Pt will demonstrate ability to maintain static sitting EOB with close supervision for 5 minutes PT Short Term Goal 2 (Week 2): Pt will perform supine<>sit with max assist of 1 PT Short Term Goal 3 (Week 2): pt to demonstrate transfer to/from bed<>chair at mod A x1 Week 3:     Skilled Therapeutic Interventions/Progress Updates:   Pt received sitting in WC and agreeable to PT. Family present with questions of d/c plan to SNF. PT educated pt's family on rehab process and continuum of care with benefits of skilled placement at pt's current functional level. Upon question pt reports then she believed she had a bowel movement. Squat pivot transfer to [facility] with total A + 2 due to increasing pushers syndrome. Sitting balance OEB with max assist to prevent LOB to the L with cue to WB through the L elbow. Rolling r and L with total A this PM to doff pants and brief. Noted small amount of stool in brief, but noted bolus of stool at rectum. Pt placed on bed pan to attempt to void. Rolling with max assist to place pan with + 2 assist. Pt assisted pt to maintain proper position on pan while attempting void due to tone and spasms in BLE. Pt unyable to void and RN notified to assist with disimpaction. Mod assist from PT to maintain sidelying while RN cleared rectum. Rolling R and L to don clean brief with total A once Stool removed and hygiene performed by PT. Pt left supine in bed with call bell in reach and all needs met.       Therapy Documentation Precautions:  Precautions Precautions: Fall, Other (comment) Precaution Comments: L hemi with hypertonia and L inattention Restrictions Weight Bearing Restrictions: No :   Vital  Signs: Therapy Vitals Temp: 97.6 F (36.4 C) Pulse Rate: 85 Resp: 18 BP: 127/80 Patient Position (if appropriate): Sitting Oxygen Therapy SpO2: 97 % O2 Device: Room Air Pain: Pain Assessment Faces Pain Scale: Hurts a little bit (with LUE stretches)    Therapy/Group: Individual Therapy  Lorie Phenix 04/25/2020, 4:17 PM

## 2020-04-25 NOTE — Progress Notes (Addendum)
[facility] PHYSICAL MEDICINE & REHABILITATION PROGRESS NOTE   Subjective/Complaints:  Tells me she slept. Belly uncomfortable from heparin shots. Left arm/leg spastic  ROS: Patient denies fever, rash, sore throat, blurred vision, nausea, vomiting, diarrhea, cough, shortness of breath or chest pain, joint or back pain, headache, or mood change.   Objective:   No results found. No results for input(s): WBC, HGB, HCT, PLT in the last 72 hours. No results for input(s): NA, K, CL, CO2, GLUCOSE, BUN, CREATININE, CALCIUM in the last 72 hours.  Intake/Output Summary (Last 24 hours) at 04/25/2020 1157 Last data filed at 04/25/2020 0924 Gross per 24 hour  Intake 460 ml  Output 1422 ml  Net -962 ml     Physical Exam: Vital Signs Blood pressure (!) 145/84, pulse 89, temperature 98.8 F (37.1 C), resp. rate 16, height 5\' 7"  (1.702 m), weight 77.2 kg, SpO2 96 %.   Constitutional: No distress . Vital signs reviewed. HEENT: EOMI, oral membranes moist Neck: supple Cardiovascular: RRR without murmur. No JVD    Respiratory/Chest: CTA Bilaterally without wheezes or rales. Normal effort    GI/Abdomen: BS +, non-tender, non-distended Ext: no clubbing, cyanosis, or edema Psych: pleasant and cooperative Skin: No evidence of breakdown, no evidence of rash Musc: No edema in extremities.  No tenderness in extremities. Neurologic: alert but awakens to voice  Motor: Left facial weakness.  Motor: LUE/LLE: 0/5 proximal distal  Tone 2/4 left pecs, biceps, wrist. 1-2/4 LLE   Assessment/Plan: 1. 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  which require 3+ hours per day of interdisciplinary therapy in a comprehensive inpatient rehab setting.  Physiatrist is providing close team supervision and 24 hour management of active medical problems listed below.  Physiatrist and rehab team continue to assess barriers to discharge/monitor patient progress toward functional and medical goals  Care  Tool:  Bathing    Body parts bathed by patient: Abdomen, Face, Chest, Right lower leg   Body parts bathed by helper: Right arm, Left arm, Front perineal area, Buttocks, Right upper leg, Left upper leg, Left lower leg     Bathing assist Assist Level: Maximal Assistance - Patient 24 - 49%     Upper Body Dressing/Undressing Upper body dressing   What is the patient wearing?: Pull over shirt    Upper body assist Assist Level: Dependent - Patient 0%    Lower Body Dressing/Undressing Lower body dressing      What is the patient wearing?: Pants     Lower body assist Assist for lower body dressing: 2 Helpers     Toileting Toileting Toileting Activity did not occur (Clothing management and hygiene only): N/A (no void or bm)  Toileting assist Assist for toileting: Dependent - Patient 0%     Transfers Chair/bed transfer  Transfers assist  Chair/bed transfer activity did not occur: Safety/medical concerns  Chair/bed transfer assist level: 2 Helpers     Locomotion Ambulation   Ambulation assist   Ambulation activity did not occur: Safety/medical concerns          Walk 10 feet activity   Assist  Walk 10 feet activity did not occur: Safety/medical concerns        Walk 50 feet activity   Assist Walk 50 feet with 2 turns activity did not occur: Safety/medical concerns         Walk 150 feet activity   Assist Walk 150 feet activity did not occur: Safety/medical concerns         Walk 10  feet on uneven surface  activity   Assist Walk 10 feet on uneven surfaces activity did not occur: Safety/medical concerns         Wheelchair     Assist Will patient use wheelchair at discharge?:  (TBD)             Wheelchair 50 feet with 2 turns activity    Assist            Wheelchair 150 feet activity     Assist          Blood pressure (!) 145/84, pulse 89, temperature 98.8 F (37.1 C), resp. rate 16, height 5\' 7"  (1.702 m),  weight 77.2 kg, SpO2 96 %.  Medical Problem List and Plan: 1.  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  Status post loop recorder  Continue CIR  PT, OT, SLP   WHO ordered  Pt with significant Left sided tone, esp LUE. Baclofen was stopped d/t neuro-sedation.    -begin trial of low dose tizanidine 7/16 2.  Antithrombotics: -DVT/anticoagulation: Subcutaneous heparin, reduce to q12 d/t bruising/pain  -recent dopplers clear             -antiplatelet therapy: Aspirin 81 mg daily 3. Pain Management: Tylenol as needed. Appears to be well controlled.              Monitor for headaches with increased activity. 4. Mood: Amantadine 150 mg daily for stimulation, tremors may be side effects D/Ced             -antipsychotic agents: N/A 5. Neuropsych: This patient is not capable of making decisions on her own behalf. 6. Skin/Wound Care: Routine skin checks 7. Fluids/Electrolytes/Nutrition: Routine in and outs. Protein and albumin are low on BMP 7/13. Ordered dietary consult. 8.  Hypertension.  Lopressor 25 mg twice daily, Norvasc 10 mg daily.    Vitals:   04/24/20 2013 04/25/20 0412  BP: 137/63 (!) 145/84  Pulse: 90 89  Resp: 20 16  Temp: 97.7 F (36.5 C) 98.8 F (37.1 C)  SpO2: 97% 96%   Lisinopril 2.5 nightly started on 7/10  7/16: hold lisinopril given tizanidine trial  9.  Post stroke dysphagia: Dysphagia #1 honey thick liquids.  Patient on IV fluids for hydration follow-up speech therapy             Advance diet as tolerated 10.  Hemorrhagic cystitis complicated by UTI.  Complete course of Ancef.  Follow-up outpatient urology 11.  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  Hemoglobin A1c 8.9.  Check blood sugars before meals and at bedtime CBG (last 3)  Recent Labs     04/24/20 2151 04/25/20 0621 04/25/20 1130  GLUCAP 293* 201* 246*   Metformin 500 daily  7/16: remains elevated increase Levemir to 22U bid.                 -observe for pattern 12.  Hyperlipidemia.  Zetia/Pravacol 13.  Constipation. MiraLAX. Adjust bowel meds as necessary   LOS: 14 days A FACE TO FACE EVALUATION WAS PERFORMED  8/16 04/25/2020, 11:57 AM

## 2020-04-25 NOTE — Progress Notes (Signed)
Physical Therapy Session Note  Patient Details  [name] MRN: [id number] Date of Birth: [birth year]  Today's Date: 04/25/2020 PT Individual Time: 1005-1105 PT Individual Time Calculation (min): 60 min   Short Term Goals:  Week 2:  PT Short Term Goal 1 (Week 2): Pt will demonstrate ability to maintain static sitting EOB with close supervision for 5 minutes PT Short Term Goal 2 (Week 2): Pt will perform supine<>sit with max assist of 1 PT Short Term Goal 3 (Week 2): pt to demonstrate transfer to/from bed<>chair at mod A x1 Week 3:     Skilled Therapeutic Interventions/Progress Updates:   Pt received supine in bed, asleep. aroused with great effort from PT.  VS taken: HR 101, BP 115/87, SpO2 100%. Pt agreeable to PT. Supine NMR: rolling R/L with max assist-total A with Bil hips/knees at 45 deg flexion. Max assist to facilitate pelvic and trunkal rotation to the R and L on this day. Hip abduction x 10 BLE with max cues to initate movement. Heel slides x 10 BLE with AROM on the R and AAROM on the L. Max cues for initiation throughout.  Supine>sit transfer with total assist on the R with max cues to initiate push into sitting throughout RUE with only trace initiation noted. Sitting balance EOB while PT assisting with dressing, total A +2 to don shirt and maintain balance. Squat pivot transfer to the L with tot al A from PT and +2 present for safety and LLE blocked Into extension. 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 Pt transported to rehab gym. Visual scanning and trunk flexion task to grab 4 clothes pins then perform cross body reach to hand to PT> pt then performed R lateral reach to force weight shifting beyond midline x 4. Patient returned to room and left sitting in [facility] with call bell in reach and all needs met.         Therapy Documentation Precautions:  Precautions Precautions: Fall, Other (comment) Precaution Comments: L hemi with  hypertonia and L inattention Restrictions Weight Bearing Restrictions: No   Pain:   denies    Therapy/Group: Individual Therapy  Lorie Phenix 04/25/2020, 11:09 AM

## 2020-04-26 LAB — GLUCOSE, CAPILLARY
Glucose-Capillary: 159 mg/dL — ABNORMAL HIGH (ref 70–99)
Glucose-Capillary: 185 mg/dL — ABNORMAL HIGH (ref 70–99)
Glucose-Capillary: 193 mg/dL — ABNORMAL HIGH (ref 70–99)
Glucose-Capillary: 277 mg/dL — ABNORMAL HIGH (ref 70–99)

## 2020-04-26 NOTE — Plan of Care (Signed)
  Problem: Consults Goal: RH STROKE PATIENT EDUCATION Description: See Patient Education module for education specifics  Outcome: Progressing Goal: Diabetes Guidelines if Diabetic/Glucose > 140 Description: If diabetic or lab glucose is > 140 mg/dl - Initiate Diabetes/Hyperglycemia Guidelines & Document Interventions  Outcome: Progressing   Problem: RH BOWEL ELIMINATION Goal: RH STG MANAGE BOWEL WITH ASSISTANCE Description: STG Manage Bowel with min assist Outcome: Progressing   Problem: RH BLADDER ELIMINATION Goal: RH STG MANAGE BLADDER WITH ASSISTANCE Description: STG Manage Bladder With min assist Outcome: Progressing   Problem: RH SKIN INTEGRITY Goal: RH STG MAINTAIN SKIN INTEGRITY WITH ASSISTANCE Description: STG Maintain Skin Integrity With mod I assist Outcome: Progressing   Problem: RH SAFETY Goal: RH STG ADHERE TO SAFETY PRECAUTIONS W/ASSISTANCE/DEVICE Description: STG Adhere to Safety Precautions With cues and reminders Outcome: Progressing   Problem: RH COGNITION-NURSING Goal: RH STG ANTICIPATES NEEDS/CALLS FOR ASSIST W/ASSIST/CUES Description: STG Anticipates Needs/Calls for Assist With cues and reminders Outcome: Progressing   Problem: RH PAIN MANAGEMENT Goal: RH STG PAIN MANAGED AT OR BELOW PT'S PAIN GOAL Description: Pain level less than 4 on scale of 0-10 Outcome: Progressing   Problem: RH KNOWLEDGE DEFICIT Goal: RH STG INCREASE KNOWLEDGE OF DIABETES Description: Pt will be able to adhere to medication regimen and dietary modification to better control blood glucose levels with min assist.  Outcome: Progressing Goal: RH STG INCREASE KNOWLEDGE OF HYPERTENSION Description: Pt will be able to adhere to medication regimen and dietary modification to better control blood pressure with min assist using handouts and booklets provided Outcome: Progressing Goal: RH STG INCREASE KNOWLEGDE OF HYPERLIPIDEMIA Description: Pt will be able to adhere to medication  regimen and dietary modification to better control blood cholesterol levels with min assist using booklets and handouts provided.  Outcome: Progressing Goal: RH STG INCREASE KNOWLEDGE OF STROKE PROPHYLAXIS Description: Pt will be able to adhere to medication regimen and dietary modification to better control blood pressure and prevent stroke with min assist using handouts and booklets provided.  Outcome: Progressing   

## 2020-04-26 NOTE — Plan of Care (Signed)
  Problem: RH Balance Goal: LTG Patient will maintain dynamic sitting balance (PT) Description: LTG:  Patient will maintain dynamic sitting balance with assistance during mobility activities (PT) Flowsheets (Taken 04/26/2020 0753) LTG: Pt will maintain dynamic sitting balance during mobility activities with:: (downgraded) Moderate Assistance - Patient 50 - 74% Note: Downgraded due to change in d/c plan to SNF   Problem: Sit to Stand Goal: LTG:  Patient will perform sit to stand with assistance level (PT) Description: LTG:  Patient will perform sit to stand with assistance level (PT) Flowsheets (Taken 04/26/2020 0753) LTG: PT will perform sit to stand in preparation for functional mobility with assistance level: (Downgraded) Total Assistance - Patient < 25% Note: Downgraded due to change in d/c plan to SNF   Problem: RH Bed Mobility Goal: LTG Patient will perform bed mobility with assist (PT) Description: LTG: Patient will perform bed mobility with assistance, with/without cues (PT). Flowsheets (Taken 04/26/2020 0753) LTG: Pt will perform bed mobility with assistance level of: (Downgraded) Maximal Assistance - Patient 25 - 49% Note: Downgraded due to change in d/c plan to SNF   Problem: RH Bed to Chair Transfers Goal: LTG Patient will perform bed/chair transfers w/assist (PT) Description: LTG: Patient will perform bed to chair transfers with assistance (PT). Flowsheets (Taken 04/26/2020 0753) LTG: Pt will perform Bed to Chair Transfers with assistance level: (Downgraded due to change in d/c plan to SNF) Total Assistance - Patient < 25% Note: Downgraded due to change in d/c plan to SNF   Problem: RH Car Transfers Goal: LTG Patient will perform car transfers with assist (PT) Description: LTG: Patient will perform car transfers with assistance (PT). Outcome: Not Applicable Flowsheets (Taken 04/26/2020 0753) LTG: Pt will perform car transfers with assist:: (goal d/c'ed) -- Note: D/c due to  change in d/c plan to SNF     Grier Rocher PT, DPT

## 2020-04-26 NOTE — Plan of Care (Deleted)
  Problem: RH Expression Communication Goal: LTG Patient will increase speech intelligibility (SLP) Description: LTG: Patient will increase speech intelligibility at word/phrase/conversation level with cues, % of the time (SLP) Flowsheets (Taken 04/26/2020 8786) LTG: Patient will increase speech intelligibility (SLP): Minimal Assistance - Patient > 75% Level: Phrase Note: Downgraded due to lack of progress   Problem: RH Problem Solving Goal: LTG Patient will demonstrate problem solving for (SLP) Description: LTG:  Patient will demonstrate problem solving for basic/complex daily situations with cues  (SLP) Flowsheets (Taken 04/26/2020 [phone]) LTG Patient will demonstrate problem solving for: Moderate Assistance - Patient 50 - 74% Note: Downgraded due to lack of progress   Problem: RH Memory Goal: LTG Patient will use memory compensatory aids to (SLP) Description: LTG:  Patient will use memory compensatory aids to recall biographical/new, daily complex information with cues (SLP) Flowsheets (Taken 04/26/2020 0947) LTG: Patient will use memory compensatory aids to (SLP): Moderate Assistance - Patient 50 - 74% Note: Downgraded due to lack of progress   Problem: RH Attention Goal: LTG Patient will demonstrate this level of attention during functional activites (SLP) Description: LTG:  Patient will will demonstrate this level of attention during functional activites (SLP) Flowsheets (Taken 04/26/2020 0962) LTG: Patient will demonstrate this level of attention during cognitive/linguistic activities with assistance of (SLP): Moderate Assistance - Patient 50 - 74% Note: Downgraded due to lack of progress   Problem: RH Awareness Goal: LTG: Patient will demonstrate awareness during functional activites type of (SLP) Description: LTG: Patient will demonstrate awareness during functional activites type of (SLP) Flowsheets (Taken 04/26/2020 8366) LTG: Patient will demonstrate awareness during  cognitive/linguistic activities with assistance of (SLP): Moderate Assistance - Patient 50 - 74% Note: Downgraded due to lack of progress

## 2020-04-26 NOTE — Plan of Care (Signed)
°  Problem: RH BLADDER ELIMINATION Goal: RH STG MANAGE BLADDER WITH ASSISTANCE Description: STG Manage Bladder With min assist Outcome: Progressing   Problem: RH BOWEL ELIMINATION Goal: RH STG MANAGE BOWEL WITH ASSISTANCE Description: STG Manage Bowel with min assist Outcome: Progressing   Problem: RH PAIN MANAGEMENT Goal: RH STG PAIN MANAGED AT OR BELOW PT'S PAIN GOAL Description: Pain level less than 4 on scale of 0-10 Outcome: Progressing   Problem: RH KNOWLEDGE DEFICIT Goal: RH STG INCREASE KNOWLEDGE OF DIABETES Description: Pt will be able to adhere to medication regimen and dietary modification to better control blood glucose levels with min assist.  Outcome: Progressing Goal: RH STG INCREASE KNOWLEDGE OF HYPERTENSION Description: Pt will be able to adhere to medication regimen and dietary modification to better control blood pressure with min assist using handouts and booklets provided Outcome: Progressing

## 2020-04-26 NOTE — Plan of Care (Signed)
  Problem: RH Expression Communication Goal: LTG Patient will increase speech intelligibility (SLP) Description: LTG: Patient will increase speech intelligibility at word/phrase/conversation level with cues, % of the time (SLP) Flowsheets (Taken 04/26/2020 7035) LTG: Patient will increase speech intelligibility (SLP): Minimal Assistance - Patient > 75% Level: Phrase Note: Downgraded due to lack of progress   Problem: RH Problem Solving Goal: LTG Patient will demonstrate problem solving for (SLP) Description: LTG:  Patient will demonstrate problem solving for basic/complex daily situations with cues  (SLP) 04/26/2020 1155 by Huston Foley, CCC-SLP Flowsheets (Taken 04/26/2020 1155) LTG Patient will demonstrate problem solving for: Maximal Assistance - Patient 25 - 49% Note: Downgraded due to lack  of progress 04/26/2020 0638 by Huston Foley, CCC-SLP Flowsheets (Taken 04/26/2020 [phone]) LTG Patient will demonstrate problem solving for: Moderate Assistance - Patient 50 - 74% Note: Downgraded due to lack of progress   Problem: RH Memory Goal: LTG Patient will use memory compensatory aids to (SLP) Description: LTG:  Patient will use memory compensatory aids to recall biographical/new, daily complex information with cues (SLP) 04/26/2020 1155 by Huston Foley, CCC-SLP Flowsheets (Taken 04/26/2020 1155) LTG: Patient will use memory compensatory aids to (SLP): Maximal Assistance - Patient 25 - 49% Note: Downgraded due to lack  of progress 04/26/2020 0638 by Huston Foley, CCC-SLP Flowsheets (Taken 04/26/2020 [phone]) LTG: Patient will use memory compensatory aids to (SLP): Moderate Assistance - Patient 50 - 74% Note: Downgraded due to lack of progress   Problem: RH Attention Goal: LTG Patient will demonstrate this level of attention during functional activites (SLP) Description: LTG:  Patient will will demonstrate this level of attention during functional activites (SLP) 04/26/2020 1155  by Huston Foley, CCC-SLP Flowsheets (Taken 04/26/2020 1155) LTG: Patient will demonstrate this level of attention during cognitive/linguistic activities with assistance of (SLP): Maximal Assistance - Patient 25 - 49% Number of minutes patient will demonstrate attention during cognitive/linguistic activities: 5 Note: Downgraded due to lack  of progress 04/26/2020 0638 by Huston Foley, CCC-SLP Flowsheets (Taken 04/26/2020 [phone]) LTG: Patient will demonstrate this level of attention during cognitive/linguistic activities with assistance of (SLP): Moderate Assistance - Patient 50 - 74% Note: Downgraded due to lack of progress   Problem: RH Awareness Goal: LTG: Patient will demonstrate awareness during functional activites type of (SLP) Description: LTG: Patient will demonstrate awareness during functional activites type of (SLP) 04/26/2020 1155 by Huston Foley, CCC-SLP Flowsheets (Taken 04/26/2020 1155) Patient will demonstrate during cognitive/linguistic activities awareness type of: Emergent LTG: Patient will demonstrate awareness during cognitive/linguistic activities with assistance of (SLP): Maximal Assistance - Patient 25 - 49% Note: Downgraded due to lack  of progress 04/26/2020 0638 by Huston Foley, CCC-SLP Flowsheets (Taken 04/26/2020 [phone]) LTG: Patient will demonstrate awareness during cognitive/linguistic activities with assistance of (SLP): Moderate Assistance - Patient 50 - 74% Note: Downgraded due to lack of progress

## 2020-04-26 NOTE — Progress Notes (Signed)
Occupational Therapy Session Note  Patient Details  [name] MRN: [id number] Date of Birth: 02/12/1946  Today's Date: 04/26/2020 OT Individual Time: 1400-1500 OT Individual Time Calculation (min): 60 min    Short Term Goals: Week 2:  OT Short Term Goal 1 (Week 2): Pt will complete 1/4 steps of donning shirt with multimodal cuing. OT Short Term Goal 2 (Week 2): Pt will locate 2/2 grooming items on L side of sink with min VCs. OT Short Term Goal 3 (Week 2): Pt will maintaining sitting balance for 5 minutes on EOB with mod A for self care tasks.  Skilled Therapeutic Interventions/Progress Updates:    Pt seen for OT session.  She was in bed to start with therapist focusing on stretching out of the LUE into elbow extension while working on rolling to the left for scapular weightbearing.  She needed total assist for rolling to the left with work on initiation of trunk flexion and rotation.  Pt with decreased motor planning to engage the LUE as well as her core.  Therapist assisted with donning pants with total assist rolling side to side.  She then transitioned to sitting with total assist as well.  Max assist was needed for maintaining sitting balance secondary to pushing to the left with LOB posteriorly secondary motor impersistence in her abdominals.  Therapist assisted with transfer to the wheelchair with total assist squat pivot.  Once positioned in the chair, her resting hand splint was positioned on the left hand as well as foot rests.  Her spouse was in as well for supervision.  Without any warning pt began to vomit.  This occurred three times, without pt stating any discomfort.  NT called and assisted with transfer back to the bed with use of the lift.  She needed total assist in bed for removal of clothing and for washing.  She then donned a hospital gown and was left in bed.  Nursing made aware of situation as well.    Therapy Documentation Precautions:   Precautions Precautions: Fall, Other (comment) Precaution Comments: L hemi with hypertonia and L inattention Restrictions Weight Bearing Restrictions: No  Pain: Pain Assessment Pain Scale: Faces Pain Score: 0-No pain ADL: See Care Tool Section for some details of mobility and selfcare  Therapy/Group: Individual Therapy  Lathyn Griggs OTR/L 04/26/2020, 4:14 PM

## 2020-04-26 NOTE — Progress Notes (Signed)
[facility] PHYSICAL MEDICINE & REHABILITATION PROGRESS NOTE   Subjective/Complaints: Had a reasonable night. Denies pain today. Slept well  ROS: Patient denies fever, rash, sore throat, blurred vision, nausea, vomiting, diarrhea, cough, shortness of breath or chest pain, joint or back pain, headache, or mood change.    Objective:   No results found. No results for input(s): WBC, HGB, HCT, PLT in the last 72 hours. No results for input(s): NA, K, CL, CO2, GLUCOSE, BUN, CREATININE, CALCIUM in the last 72 hours.  Intake/Output Summary (Last 24 hours) at 04/26/2020 1131 Last data filed at 04/26/2020 1045 Gross per 24 hour  Intake 340 ml  Output 1750 ml  Net -1410 ml     Physical Exam: Vital Signs Blood pressure (!) 132/98, pulse 86, temperature 98.5 F (36.9 C), temperature source Oral, resp. rate 18, height 5\' 7"  (1.702 m), weight 76.5 kg, SpO2 98 %.   Constitutional: No distress . Vital signs reviewed. HEENT: EOMI, oral membranes moist Neck: supple Cardiovascular: RRR without murmur. No JVD    Respiratory/Chest: CTA Bilaterally without wheezes or rales. Normal effort    GI/Abdomen: BS +, non-tender, non-distended Ext: no clubbing, cyanosis, or edema Psych: pleasant but a little flat Skin: No evidence of breakdown, no evidence of rash Musc: No edema in extremities.  No tenderness in extremities. Neurologic: alert but awakens to voice  Motor: Left facial weakness.  Motor: LUE/LLE: 0/5 proximal distal  Tone 2/4 left pecs, biceps, wrist. 1-2/4 LLE   Assessment/Plan: 1. 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  which require 3+ hours per day of interdisciplinary therapy in a comprehensive inpatient rehab setting.  Physiatrist is providing close team supervision and 24 hour management of active medical problems listed below.  Physiatrist and rehab team continue to assess barriers to discharge/monitor patient progress toward functional and medical goals  Care  Tool:  Bathing    Body parts bathed by patient: Abdomen, Face, Chest, Right lower leg   Body parts bathed by helper: Right arm, Left arm, Front perineal area, Buttocks, Right upper leg, Left upper leg, Left lower leg     Bathing assist Assist Level: Maximal Assistance - Patient 24 - 49%     Upper Body Dressing/Undressing Upper body dressing   What is the patient wearing?: Pull over shirt    Upper body assist Assist Level: Dependent - Patient 0%    Lower Body Dressing/Undressing Lower body dressing      What is the patient wearing?: Pants     Lower body assist Assist for lower body dressing: 2 Helpers     Toileting Toileting Toileting Activity did not occur (Clothing management and hygiene only): N/A (no void or bm)  Toileting assist Assist for toileting: Dependent - Patient 0%     Transfers Chair/bed transfer  Transfers assist  Chair/bed transfer activity did not occur: Safety/medical concerns  Chair/bed transfer assist level: 2 Helpers     Locomotion Ambulation   Ambulation assist   Ambulation activity did not occur: Safety/medical concerns          Walk 10 feet activity   Assist  Walk 10 feet activity did not occur: Safety/medical concerns        Walk 50 feet activity   Assist Walk 50 feet with 2 turns activity did not occur: Safety/medical concerns         Walk 150 feet activity   Assist Walk 150 feet activity did not occur: Safety/medical concerns  Walk 10 feet on uneven surface  activity   Assist Walk 10 feet on uneven surfaces activity did not occur: Safety/medical concerns         Wheelchair     Assist Will patient use wheelchair at discharge?:  (TBD)             Wheelchair 50 feet with 2 turns activity    Assist            Wheelchair 150 feet activity     Assist          Blood pressure (!) 132/98, pulse 86, temperature 98.5 F (36.9 C), temperature source Oral, resp. rate 18,  height 5\' 7"  (1.702 m), weight 76.5 kg, SpO2 98 %.  Medical Problem List and Plan: 1.  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  Status post loop recorder  Continue CIR  PT, OT, SLP   WHO ordered  Spastic left hemiparesis, esp LUE: Baclofen was stopped d/t neuro-sedation.    -began trial of low dose tizanidine 7/16, tolerating so far 2.  Antithrombotics: -DVT/anticoagulation: Subcutaneous heparin, reduce to q12 d/t bruising/pain  -recent dopplers clear             -antiplatelet therapy: Aspirin 81 mg daily 3. Pain Management: Tylenol as needed. Appears to be well controlled.              Monitor for headaches with increased activity. 4. Mood: Amantadine 150 mg daily for stimulation, tremors may be side effects D/Ced             -antipsychotic agents: N/A 5. Neuropsych: This patient is not capable of making decisions on her own behalf. 6. Skin/Wound Care: Routine skin checks 7. Fluids/Electrolytes/Nutrition: Routine in and outs. Protein and albumin are low on BMP 7/13. Ordered dietary consult. 8.  Hypertension.  Lopressor 25 mg twice daily, Norvasc 10 mg daily.    Vitals:   04/25/20 1930 04/26/20 0509  BP: 127/72 (!) 132/98  Pulse: 84 86  Resp: 16 18  Temp: 98.5 F (36.9 C) 98.5 F (36.9 C)  SpO2: 100% 98%   Lisinopril 2.5 nightly started on 7/10  7/17: held lisinopril given tizanidine trial--may need to resume 9.  Post stroke dysphagia: Dysphagia #1 honey thick liquids.  Patient on IV fluids for hydration follow-up speech therapy             Advance diet as tolerated 10.  Hemorrhagic cystitis complicated by UTI.  Complete course of Ancef.  Follow-up outpatient urology 11.  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  Hemoglobin A1c 8.9.  Check blood sugars before meals and at  bedtime CBG (last 3)  Recent Labs    04/25/20 1631 04/25/20 2045 04/26/20 0625  GLUCAP 244* 263* 159*   Metformin 500 daily  7/16: remains elevated increased Levemir to 22U bid.                 -observe for pattern 7/17 12.  Hyperlipidemia.  Zetia/Pravacol 13.  Constipation. MiraLAX. Adjust bowel meds as necessary   LOS: 15 days A FACE TO FACE EVALUATION WAS PERFORMED  8/17 04/26/2020, 11:31 AM

## 2020-04-27 LAB — GLUCOSE, CAPILLARY
Glucose-Capillary: 116 mg/dL — ABNORMAL HIGH (ref 70–99)
Glucose-Capillary: 256 mg/dL — ABNORMAL HIGH (ref 70–99)
Glucose-Capillary: 180 mg/dL — ABNORMAL HIGH (ref 70–99)
Glucose-Capillary: 193 mg/dL — ABNORMAL HIGH (ref 70–99)

## 2020-04-27 MED ORDER — INSULIN DETEMIR 100 UNIT/ML ~~LOC~~ SOLN
24.0000 [IU] | Freq: Two times a day (BID) | SUBCUTANEOUS | Status: DC
  Administered 2020-04-27 – 2020-05-05 (×16): 24 [IU] via SUBCUTANEOUS
  Filled 2020-04-27 (×18): qty 0.24

## 2020-04-27 NOTE — Progress Notes (Signed)
[facility] PHYSICAL MEDICINE & REHABILITATION PROGRESS NOTE   Subjective/Complaints: No problems overnight. Doesn't indicate any pain  ROS: Patient denies fever, rash, sore throat, blurred vision, nausea, vomiting, diarrhea, cough, shortness of breath or chest pain, joint or back pain, headache, or mood change. .    Objective:   No results found. No results for input(s): WBC, HGB, HCT, PLT in the last 72 hours. No results for input(s): NA, K, CL, CO2, GLUCOSE, BUN, CREATININE, CALCIUM in the last 72 hours.  Intake/Output Summary (Last 24 hours) at 04/27/2020 1113 Last data filed at 04/27/2020 0700 Gross per 24 hour  Intake 712 ml  Output 600 ml  Net 112 ml     Physical Exam: Vital Signs Blood pressure (!) 141/78, pulse 77, temperature 98.2 F (36.8 C), resp. rate 17, height 5\' 7"  (1.702 m), weight 75.2 kg, SpO2 98 %.   Constitutional: No distress . Vital signs reviewed. HEENT: EOMI, oral membranes moist Neck: supple Cardiovascular: RRR without murmur. No JVD    Respiratory/Chest: CTA Bilaterally without wheezes or rales. Normal effort    GI/Abdomen: BS +, non-tender, non-distended Ext: no clubbing, cyanosis, or edema Psych: flat but cooperative Skin: No evidence of breakdown, no evidence of rash Musc: No edema in extremities.  No tenderness in extremities. Neurologic: alert and follows basic commands  Motor: Left facial weakness.  Motor: LUE/LLE: 0/5 proximal distal  Tone 1-2/4 left pecs, biceps, wrist. 1-2/4 LLE   Assessment/Plan: 1. 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  which require 3+ hours per day of interdisciplinary therapy in a comprehensive inpatient rehab setting.  Physiatrist is providing close team supervision and 24 hour management of active medical problems listed below.  Physiatrist and rehab team continue to assess barriers to discharge/monitor patient progress toward functional and medical goals  Care Tool:  Bathing    Body parts  bathed by patient: Abdomen, Face, Chest, Right lower leg   Body parts bathed by helper: Right arm, Left arm, Chest, Abdomen, Front perineal area, Buttocks     Bathing assist Assist Level: Total Assistance - Patient < 25%     Upper Body Dressing/Undressing Upper body dressing   What is the patient wearing?: Pull over shirt    Upper body assist Assist Level: Dependent - Patient 0%    Lower Body Dressing/Undressing Lower body dressing      What is the patient wearing?: Pants     Lower body assist Assist for lower body dressing: Total Assistance - Patient < 25% (supine rolling)     Toileting Toileting Toileting Activity did not occur (Clothing management and hygiene only): N/A (no void or bm)  Toileting assist Assist for toileting: Dependent - Patient 0%     Transfers Chair/bed transfer  Transfers assist  Chair/bed transfer activity did not occur: Safety/medical concerns  Chair/bed transfer assist level: Dependent - mechanical lift     Locomotion Ambulation   Ambulation assist   Ambulation activity did not occur: Safety/medical concerns          Walk 10 feet activity   Assist  Walk 10 feet activity did not occur: Safety/medical concerns        Walk 50 feet activity   Assist Walk 50 feet with 2 turns activity did not occur: Safety/medical concerns         Walk 150 feet activity   Assist Walk 150 feet activity did not occur: Safety/medical concerns         Walk 10 feet on uneven surface  activity   Assist Walk 10 feet on uneven surfaces activity did not occur: Safety/medical concerns         Wheelchair     Assist Will patient use wheelchair at discharge?:  (TBD)             Wheelchair 50 feet with 2 turns activity    Assist            Wheelchair 150 feet activity     Assist          Blood pressure (!) 141/78, pulse 77, temperature 98.2 F (36.8 C), resp. rate 17, height 5\' 7"  (1.702 m), weight 75.2  kg, SpO2 98 %.  Medical Problem List and Plan: 1.  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  Status post loop recorder  Continue CIR  PT, OT, SLP   WHO ordered  Spastic left hemiparesis, esp LUE: Baclofen was stopped d/t neuro-sedation.    -began trial of low dose tizanidine 7/16, tolerating so far 2.  Antithrombotics: -DVT/anticoagulation: Subcutaneous heparin, reduce to q12 d/t bruising/pain  -recent dopplers clear             -antiplatelet therapy: Aspirin 81 mg daily 3. Pain Management: Tylenol as needed. Appears to be well controlled.              Monitor for headaches with increased activity. 4. Mood: Amantadine 150 mg daily for stimulation, tremors may be side effects D/Ced             -antipsychotic agents: N/A 5. Neuropsych: This patient is not capable of making decisions on her own behalf. 6. Skin/Wound Care: Routine skin checks 7. Fluids/Electrolytes/Nutrition: Routine in and outs. Protein and albumin are low on BMP 7/13. Ordered dietary consult. 8.  Hypertension.  Lopressor 25 mg twice daily, Norvasc 10 mg daily.    Vitals:   04/26/20 2034 04/27/20 0418  BP: 124/74 (!) 141/78  Pulse: 84 77  Resp: 18 17  Temp: 98.2 F (36.8 C) 98.2 F (36.8 C)  SpO2: 96% 98%   Lisinopril 2.5 nightly started on 7/10  7/17: held lisinopril given tizanidine trial 9.  Post stroke dysphagia: Dysphagia #1 honey thick liquids.  Patient on IV fluids for hydration follow-up speech therapy             Advance diet as tolerated 10.  Hemorrhagic cystitis complicated by UTI.  Complete course of Ancef.  Follow-up outpatient urology 11.  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  Hemoglobin A1c 8.9.  Check blood sugars before meals and at bedtime CBG (last 3)  Recent Labs    04/26/20 1716  04/26/20 2123 04/27/20 0618  GLUCAP 193* 277* 116*   Metformin 500 daily  7/16: remains elevated increased Levemir to 22U bid.               7/18: increase levemir to 24u bid 12.  Hyperlipidemia.  Zetia/Pravacol 13.  Constipation. MiraLAX. Adjust bowel meds as necessary   LOS: 16 days A FACE TO FACE EVALUATION WAS PERFORMED  8/18 04/27/2020, 11:13 AM

## 2020-04-27 NOTE — Discharge Summary (Signed)
Physician Discharge Summary  Patient ID: Meredith Shaw MRN: [id number] DOB/AGE: 1945-11-20 74 y.o.  Admit date: 04/11/2020 Discharge date: 05/05/2020  Discharge Diagnoses:  Principal Problem:   Acute ischemic right MCA stroke [facility]) s/p tPA & attempted revascularization, embolic stroke, source unknown Active Problems:   Pressure injury of skin   Right middle cerebral artery stroke (HCC)   Uncontrolled type 2 diabetes mellitus with hyperglycemia (HCC)   Dysphagia, post-stroke   Labile blood pressure   Left hemiplegia (HCC)   Spastic hemiplegia affecting nondominant side (HCC)   Stage 3a chronic kidney disease   Essential hypertension DVT prophylaxis Mood stabilization Hyperlipidemia Hypothyroidism  Discharged Condition: Stable  Significant Diagnostic Studies: EP PPM/ICD IMPLANT  Result Date: 04/11/2020 SURGEON:  Loman Brooklyn, MD   PREPROCEDURE DIAGNOSIS:  Cryptogenic Stroke   POSTPROCEDURE DIAGNOSIS:  Cryptogenic Stroke    PROCEDURES:  1. Implantable loop recorder implantation   INTRODUCTION:  Meredith Shaw is a 74 y.o. female with a history of unexplained stroke who presents today for implantable loop implantation.  The patient has had a cryptogenic stroke.  Despite an extensive workup by neurology, no reversible causes have been identified.  she has worn telemetry during which she did not have arrhythmias.  There is significant concern for possible atrial fibrillation as the cause for the patients stroke.  The patient therefore presents today for implantable loop implantation.   DESCRIPTION OF PROCEDURE:  Informed written consent was obtained, and the patient was brought to the electrophysiology lab in a fasting state.  The patient required no sedation for the procedure today.  Mapping over the patient's chest was performed by the EP lab staff to identify the area where electrograms were most prominent for ILR recording.  This area was found to be the left parasternal region over the  3rd-4th intercostal space. The patients left chest was therefore prepped and draped in the usual sterile fashion by the EP lab staff. The skin overlying the left parasternal region was infiltrated with lidocaine for local analgesia.  A 0.5-cm incision was made over the left parasternal region over the 3rd intercostal space.  A subcutaneous ILR pocket was fashioned using a combination of sharp and blunt dissection.  A Medtronic Reveal [place] model [id number] implantable loop recorder was then placed into the pocket  R waves were very prominent and measured 0.66mV. EBL<1 ml.  Steri- Strips and a sterile dressing were then applied.  There were no early apparent complications.   CONCLUSIONS:  1. Successful implantation of a Medtronic Reveal LINQ implantable loop recorder for cryptogenic stroke  2. No early apparent complications.   DG CHEST PORT 1 VIEW  Result Date: 04/05/2020 CLINICAL DATA:  Leukocytosis.  History of hypertension and diabetes. EXAM: PORTABLE CHEST 1 VIEW COMPARISON:  03/26/2020 FINDINGS: Cardiac silhouette is normal in size. No mediastinal or hilar masses. Clear lungs.  No convincing pleural effusion and no pneumothorax. Enteric feeding tube passes through the stomach and below the included field of view. Skeletal structures are grossly intact. IMPRESSION: No acute cardiopulmonary disease. Electronically Signed   By: Amie Portland M.D.   On: 04/05/2020 13:34   DG Swallowing Func-Speech Pathology  Result Date: 04/23/2020 Objective Swallowing Evaluation: Type of Study: MBS-Modified Barium Swallow Study  Patient Details [name] MRN: [id number] Date of Birth: August 27, 1946 Today's Date: 04/23/2020 Time: SLP Start Time (ACUTE ONLY): 0935 -SLP Stop Time (ACUTE ONLY): 1000 SLP Time Calculation (min) (ACUTE ONLY): 25 min Past Medical History: Past Medical History:  Diagnosis Date  Arthritis   OA AND PAIN BOTH KNEES -RIGHT KNEE HURTS WORSE THAN LEFT  Cataract   Diabetes mellitus   ORAL  MEDICATION - NO INSULIN  Hypertension   Hypertensive retinopathy   OU  Hypothyroidism   Kidney stones   NONE AT PRESENT TIME THAT PT IS AWARE OF  Osteoporosis  Past Surgical History: Past Surgical History: Procedure Laterality Date  ABDOMINAL HYSTERECTOMY    BUBBLE STUDY  03/26/2020  Procedure: BUBBLE STUDY;  Surgeon: Lars Masson, MD;  Location: [facility] ENDOSCOPY;  Service: Cardiovascular;;  CATARACT EXTRACTION    CHOLECYSTECTOMY  1972  COLONOSCOPY    EYE SURGERY    RIGHT CATARACT EXTRACTON WITH LENS IMPLANT  GROWTH REMOVED FROM THYROID    INTRAMEDULLARY (IM) NAIL INTERTROCHANTERIC Left 02/10/2018  Procedure: INTRAMEDULLARY (IM) NAIL INTERTROCHANTRIC;  Surgeon: Kathryne Hitch, MD;  Location: [facility] OR;  Service: Orthopedics;  Laterality: Left;  INTRAMEDULLARY (IM) NAIL INTERTROCHANTERIC Right 08/20/2018  Procedure: ORIF RIGHT HIP INTERTROC FRACTURE;  Surgeon: Kathryne Hitch, MD;  Location: [facility] OR;  Service: Orthopedics;  Laterality: Right;  IR CT HEAD LTD  03/24/2020  IR PERCUTANEOUS ART THROMBECTOMY/INFUSION INTRACRANIAL INC DIAG ANGIO  03/24/2020  left hip surgery   02/2018  LOOP RECORDER INSERTION N/A 04/11/2020  Procedure: LOOP RECORDER INSERTION;  Surgeon: Regan Lemming, MD;  Location: [facility] INVASIVE CV LAB;  Service: Cardiovascular;  Laterality: N/A;  ORIF HIP FRACTURE Right 08/20/2018  PERCUTANEOUS NEPHROLITHOTOMY    RADIOLOGY WITH ANESTHESIA N/A 03/23/2020  Procedure: IR WITH ANESTHESIA;  Surgeon: Julieanne Cotton, MD;  Location: [facility] OR;  Service: Radiology;  Laterality: N/A;  TEE WITHOUT CARDIOVERSION N/A 03/26/2020  Procedure: TRANSESOPHAGEAL ECHOCARDIOGRAM (TEE);  Surgeon: Lars Masson, MD;  Location: [facility] ENDOSCOPY;  Service: Cardiovascular;  Laterality: N/A;  TOTAL KNEE ARTHROPLASTY  06/09/2012  Procedure: TOTAL KNEE ARTHROPLASTY;  Surgeon: Kathryne Hitch, MD;  Location: [facility] ORS;  Service: Orthopedics;  Laterality: Right;  Right Total Knee Arthroplasty  TOTAL  KNEE ARTHROPLASTY Left 04/09/2014  Procedure: LEFT TOTAL KNEE ARTHROPLASTY;  Surgeon: Kathryne Hitch, MD;  Location: [facility] OR;  Service: Orthopedics;  Laterality: Left;  TUBAL LIGATION   HPI: See H&P  Subjective: Pt awake, alert, pleasant, participative Assessment / Plan / Recommendation CHL IP CLINICAL IMPRESSIONS 04/23/2020 Clinical Impression Patient presents with mildly improved swallowing function since previous MBS but severe lethargy and cognitive impairments continue to impact her overall swallowing function. Oral phase is marked by left anterior spillage, prolonged AP transit and impaired mastication with all solids and liquids. Patient consistently triggered the swallow at the pyriform sinuses with both nectar-thick and thin liquids resulting in consistent deep penetration and one episode of silent aspiration with thin liquids. Nectar-thick liquids via straw were not penetrated. Due to patients ongoing lethargy, cognitive impairments, and limited mobility, recommend patient remain on Dys. 1 textures but upgrade to nectar-thick liquids via straw. Patients daughter present during study and educated on results and recommendations. She verbalized understanding and agreement. SLP Visit Diagnosis Dysphagia, oropharyngeal phase (R13.12) Attention and concentration deficit following -- Frontal lobe and executive function deficit following -- Impact on safety and function Mild aspiration risk;Moderate aspiration risk   CHL IP TREATMENT RECOMMENDATION 04/23/2020 Treatment Recommendations Therapy as outlined in treatment plan below   Prognosis 04/23/2020 Prognosis for Safe Diet Advancement Good Barriers to Reach Goals Cognitive deficits Barriers/Prognosis Comment -- CHL IP DIET RECOMMENDATION 04/23/2020 SLP Diet Recommendations Dysphagia 1 (Puree) solids;Nectar thick liquid Liquid Administration via Straw Medication Administration Crushed with puree  Compensations Monitor for anterior loss;Small sips/bites;Slow  rate;Follow solids with liquid;Minimize environmental distractions Postural Changes Seated upright at 90 degrees   CHL IP OTHER RECOMMENDATIONS 04/23/2020 Recommended Consults -- Oral Care Recommendations Oral care BID Other Recommendations Order thickener from pharmacy;Prohibited food (jello, ice cream, thin soups);Remove water pitcher;Have oral suction available   CHL IP FOLLOW UP RECOMMENDATIONS 04/23/2020 Follow up Recommendations Inpatient Rehab   CHL IP FREQUENCY AND DURATION 04/23/2020 Speech Therapy Frequency (ACUTE ONLY) min 3x week Treatment Duration 3 weeks      CHL IP ORAL PHASE 04/23/2020 Oral Phase Impaired Oral - Pudding Teaspoon -- Oral - Pudding Cup -- Oral - Honey Teaspoon Delayed oral transit;Left anterior bolus loss Oral - Honey Cup NT Oral - Nectar Teaspoon Delayed oral transit;Left anterior bolus loss;Decreased bolus cohesion;Lingual/palatal residue;Weak lingual manipulation Oral - Nectar Cup Weak lingual manipulation;Left anterior bolus loss;Decreased bolus cohesion;Delayed oral transit;Lingual/palatal residue Oral - Nectar Straw Lingual/palatal residue;Decreased bolus cohesion;Delayed oral transit;Left anterior bolus loss;Weak lingual manipulation Oral - Thin Teaspoon Left anterior bolus loss;Delayed oral transit;Decreased bolus cohesion;Lingual/palatal residue;Weak lingual manipulation Oral - Thin Cup -- Oral - Thin Straw Left anterior bolus loss;Lingual/palatal residue;Delayed oral transit;Decreased bolus cohesion;Weak lingual manipulation Oral - Puree Left anterior bolus loss;Delayed oral transit;Decreased bolus cohesion;Lingual/palatal residue;Left pocketing in lateral sulci Oral - Mech Soft Left pocketing in lateral sulci;Delayed oral transit;Decreased bolus cohesion;Lingual/palatal residue;Impaired mastication;Left anterior bolus loss Oral - Regular -- Oral - Multi-Consistency -- Oral - Pill -- Oral Phase - Comment --  CHL IP PHARYNGEAL PHASE 04/23/2020 Pharyngeal Phase Impaired Pharyngeal-  Pudding Teaspoon -- Pharyngeal -- Pharyngeal- Pudding Cup -- Pharyngeal -- Pharyngeal- Honey Teaspoon Delayed swallow initiation-vallecula Pharyngeal Material does not enter airway Pharyngeal- Honey Cup NT Pharyngeal -- Pharyngeal- Nectar Teaspoon Delayed swallow initiation-pyriform sinuses Pharyngeal Material does not enter airway Pharyngeal- Nectar Cup Delayed swallow initiation-pyriform sinuses;Penetration/Aspiration during swallow;Reduced airway/laryngeal closure Pharyngeal Material enters airway, remains ABOVE vocal cords and not ejected out Pharyngeal- Nectar Straw Delayed swallow initiation-pyriform sinuses Pharyngeal -- Pharyngeal- Thin Teaspoon Delayed swallow initiation-pyriform sinuses;Reduced airway/laryngeal closure;Penetration/Aspiration during swallow Pharyngeal Material enters airway, remains ABOVE vocal cords and not ejected out Pharyngeal- Thin Cup NT Pharyngeal -- Pharyngeal- Thin Straw Delayed swallow initiation-pyriform sinuses;Penetration/Aspiration during swallow Pharyngeal Material enters airway, CONTACTS cords and not ejected out Pharyngeal- Puree WFL Pharyngeal -- Pharyngeal- Mechanical Soft Delayed swallow initiation-vallecula Pharyngeal -- Pharyngeal- Regular -- Pharyngeal -- Pharyngeal- Multi-consistency -- Pharyngeal -- Pharyngeal- Pill -- Pharyngeal -- Pharyngeal Comment --  No flowsheet data found. PAYNE, COURTNEY 04/23/2020, 3:02 PM    Feliberto Gottron, MA, CCC-SLP [phone]            Labs:  Basic Metabolic Panel: Recent Labs  Lab 05/05/20 0720  NA 143  K 4.0  CL 109  CO2 25  GLUCOSE 113*  BUN 33*  CREATININE 1.09*  CALCIUM 9.2    CBC: No results for input(s): WBC, NEUTROABS, HGB, HCT, MCV, PLT in the last 168 hours.  CBG: Recent Labs  Lab 05/04/20 1123 05/04/20 1621 05/04/20 2040 05/05/20 0551 05/05/20 1126  GLUCAP 205* 153* 168* 113* 151*   Family history.  Mother with CAD as well as breast cancer.  Father with CVA and CAD.  Maternal grandmother with  diabetes.  Denies any colon cancer esophageal cancer or rectal cancer  Brief HPI:   Meredith Shaw is a 74 y.o. right-handed female with history of type 2 diabetes mellitus complicated by retinopathy and hypertension.  Patient lives with spouse 1 level home 2 steps to entry.  Reportedly  ambulating without assistive device prior to admission but sedentary.  Presented 03/23/2020 with dysarthria and left side weakness.  CT of the head was negative and TPA was administered.  CT angiogram revealed distal right MCA M1 occlusion with moderate collateral flow and right MCA.  She underwent cerebral angiogram with revascularization per interventional radiology.  She did require short-term intubation.  Hospital course complicated by coffee-ground emesis and low-grade fever worsening of mental status.  Due to concerns for urosepsis she was started on IV meropenem for a short time.  Repeat CT of the head showed progressive right MCA infarction without evidence of reocclusion.  Follow-up MRI personally reviewed showing multiple right MCA infarcts.  Per report large evolving acute ischemic right MCA territory infarction with associated petechial hemorrhage, gyral swelling edema with mass regional mass-effect and additional scattered nonhemorrhagic infarcts in the right PCA territory, left frontal white matter and loss of flow with reocclusion distal right M1 M2 occlusion.  Bilateral lower extremity Dopplers negative.  Echocardiogram with ejection fraction of 70 to 75% with possible echodensity attached to the posterior mitral valve.  TEE ordered for work-up was negative for vegetation or thrombus and bubble study negative.Loop recorder placed 04/11/2020.  EEG completed due to lethargy showed sharp waves in right frontal region and slow generalized lateralization in right hemisphere due to underlying infarct.  No definite seizure identified.  Urology consulted 04/07/2020 Dr. Annabell Howells for hematuria, felt to be related to  anticoagulation with noted urine cultures Klebsiella placed on Ancef.  A recent renal ultrasound was completed during hospital course showed no mass or hydronephrosis.  Maintain on aspirin Plavix for CVA prophylaxis Plavix discontinued due to bouts of hematuria.  Patient had been cleared for subcutaneous heparin for DVT prophylaxis.  Hospital course dysphagia #1 honey thick liquid diet nasogastric tube removed 04/05/2020.  Patient was admitted for a comprehensive rehab program   Hospital Course: Meredith Shaw was admitted to rehab 04/11/2020 for inpatient therapies to consist of PT, ST and OT at least three hours five days a week. Past admission physiatrist, therapy team and rehab RN have worked together to provide customized collaborative inpatient rehab.  Pertaining to patient's right MCA infarction status post TPA with interventional radiology with TIC13 revascularization reoccluded follow-up infarct embolic secondary secondary to unknown source.  Status post loop recorder.  Subcutaneous heparin for DVT prophylaxis aspirin for CVA prophylaxis patient also been on Plavix was discontinued due to hematuria.  Noted spasticity left upper extremity initially on baclofen discontinued due to sedation trial of Zanaflex.  Mood stabilization with amantadine again discontinued due to tremors.  Blood pressure control with Lopressor and Norvasc.  Hemorrhagic cystitis complicated by UTI completed course of Ancef patient follow-up urology services.  Blood sugars controlled hemoglobin A1c 8.9.  Metformin daily as well as Levemir as directed.  Her diet has been advanced to dysphagia #1 nectar thick liquid.  She continued on Zetia as well as Pravachol for hyperlipidemia.  Bouts of constipation resolved with laxative assistance.  Hypothyroidism with Synthroid as directed.   Blood pressures were monitored on TID basis and soft and controlled  Diabetes has been monitored with ac/hs CBG checks and SSI was use prn for tighter BS  control.    Rehab course: During patient's stay in rehab weekly team conferences were held to monitor patient's progress, set goals and discuss barriers to discharge. At admission, patient required +2 physical assist sit to stand max assist side-lying to sitting max assist for rolling total assist sit to supine.  Max assist upper body bathing max is lower body bathing max assist upper body dressing total assist lower body dressing  Physical exam.  Blood pressure 125/68 pulse 81 temperature 98.3 respiration 22 oxygen saturation 90% room air Constitutional.  No acute distress HEENT Head.  Normocephalic and atraumatic Pupils round and reactive to light with right gaze preference Neck.  Supple nontender no JVD without thyromegaly Cardiac regular rate rhythm without any extra sounds or murmur heard Abdomen.  Soft nontender positive bowel sounds without rebound Respiratory effort normal no respiratory distress without wheeze Musculoskeletal no edema or tenderness in extremities Skin.  Warm and dry Neurological.  Alert significant left neglect follows commands globally aphasic right lean as well as gaze preference.  Left side 0/5 spontaneous movement noted on right upper extremity greater than right lower extremity  /She  has had improvement in activity tolerance, balance, postural control as well as ability to compensate for deficits. Francis Dowse/She has had improvement in functional use RUE/LUE  and RLE/LLE as well as improvement in awareness.  Squat pivot transfers to wheelchair with total assist of 2 due to increasing pushing syndrome.  Sitting balance edge of bed max assist to prevent loss of balance.  Rolling right and left with total assist.  Rolling with max assist to place bedpan.  She needed total assist for rolling to the left with work on initiation of trunk flexion and rotation.  Patient with decreased motor planning to engage in left upper extremity as well as her core.  Therapist assisted with donning  pants with total assist.  Patient demonstrates improved cognitive function requires overall mod max assist and verbal and visual cues to complete functional unfamiliar tasks safely.  After discussion with family recommendations were made for skilled nursing facility.       Disposition: Discharge to skilled nursing facility    Diet: Dysphagia #1 nectar thick liquids  Special Instructions: No smoking or alcohol  Medications at discharge 1.  Tylenol as needed 2.  Norvasc 10 mg p.o. daily 3.  Aspirin 81 mg p.o. daily 4.  Dulcolax tablet 5 mg daily 5.  Zetia 10 mg p.o. daily 6.  Levemir 24 units twice daily 7.  Synthroid 125 mcg p.o. daily 8.  Glucophage 500 mg p.o. daily 9.  Lopressor 25 mg p.o. twice daily 10.  Multivitamin daily 11.  Protonix daily 12.  MiraLAX daily hold for loose stool 13.  Pravachol 40 mg p.o. daily 14.  Zanaflex 1 mg p.o. twice daily 15 Requip 0.25 mg TID  30-35 minutes were spent completing discharge summary and discharge planning  Discharge Instructions    Ambulatory referral to Neurology   Complete by: As directed    An appointment is requested in approximately 4 weeks right MCA infarction   Ambulatory referral to Physical Medicine Rehab   Complete by: As directed    Follow up one month right MCA infarction.Patient for SNF placement       Contact information for follow-up providers    Kirsteins, Victorino SparrowAndrew E, MD Follow up.   Specialty: Physical Medicine and Rehabilitation Why: Office to call for appointment Contact information: [address] [place] [address] [phone]        Bjorn PippinWrenn, John, MD Follow up.   Specialty: Urology Why: Call for appointment Contact information: [address] [place] [address] [phone]        Lennette BihariKelly, Thomas A, MD Follow up.   Specialty: Cardiology Why: call for appointment Contact information: [address] [place] [address] [phone]        Julieanne Cotton, MD Follow up.   Specialties: Interventional Radiology, Radiology Why: call for appointment Contact information: [address] [place] [address] [phone]            Contact information for after-discharge care    Destination    [facility] Preferred SNF .   Service: Skilled Nursing Contact information: [address] [place] [address] [phone]                  Signed: Mcarthur Rossetti Arvine Clayburn 05/05/2020, 11:46 AM

## 2020-04-27 NOTE — Plan of Care (Signed)
  Problem: Consults Goal: RH STROKE PATIENT EDUCATION Description: See Patient Education module for education specifics  Outcome: Progressing Goal: Diabetes Guidelines if Diabetic/Glucose > 140 Description: If diabetic or lab glucose is > 140 mg/dl - Initiate Diabetes/Hyperglycemia Guidelines & Document Interventions  Outcome: Progressing   Problem: RH BOWEL ELIMINATION Goal: RH STG MANAGE BOWEL WITH ASSISTANCE Description: STG Manage Bowel with min assist Outcome: Progressing   Problem: RH BLADDER ELIMINATION Goal: RH STG MANAGE BLADDER WITH ASSISTANCE Description: STG Manage Bladder With min assist Outcome: Progressing   Problem: RH SKIN INTEGRITY Goal: RH STG MAINTAIN SKIN INTEGRITY WITH ASSISTANCE Description: STG Maintain Skin Integrity With mod I assist Outcome: Progressing   Problem: RH SAFETY Goal: RH STG ADHERE TO SAFETY PRECAUTIONS W/ASSISTANCE/DEVICE Description: STG Adhere to Safety Precautions With cues and reminders Outcome: Progressing   Problem: RH COGNITION-NURSING Goal: RH STG ANTICIPATES NEEDS/CALLS FOR ASSIST W/ASSIST/CUES Description: STG Anticipates Needs/Calls for Assist With cues and reminders Outcome: Progressing   Problem: RH PAIN MANAGEMENT Goal: RH STG PAIN MANAGED AT OR BELOW PT'S PAIN GOAL Description: Pain level less than 4 on scale of 0-10 Outcome: Progressing   Problem: RH KNOWLEDGE DEFICIT Goal: RH STG INCREASE KNOWLEDGE OF DIABETES Description: Pt will be able to adhere to medication regimen and dietary modification to better control blood glucose levels with min assist.  Outcome: Progressing Goal: RH STG INCREASE KNOWLEDGE OF HYPERTENSION Description: Pt will be able to adhere to medication regimen and dietary modification to better control blood pressure with min assist using handouts and booklets provided Outcome: Progressing Goal: RH STG INCREASE KNOWLEGDE OF HYPERLIPIDEMIA Description: Pt will be able to adhere to medication  regimen and dietary modification to better control blood cholesterol levels with min assist using booklets and handouts provided.  Outcome: Progressing Goal: RH STG INCREASE KNOWLEDGE OF STROKE PROPHYLAXIS Description: Pt will be able to adhere to medication regimen and dietary modification to better control blood pressure and prevent stroke with min assist using handouts and booklets provided.  Outcome: Progressing   

## 2020-04-27 NOTE — Progress Notes (Signed)
Physical Therapy Discharge Summary  Patient Details  [name] MRN: [id number] Date of Birth: [birth year]  Today's Date: 04/27/2020      Patient has met 2 of 3 long term goals due to improved activity tolerance, improved postural control and improved attention.  Patient to discharge at a wheelchair level Total Assist.   Patient's care partner unavailable to provide the necessary physical and cognitive assistance at discharge.  Reasons goals not met: Pt continues to require 1-2 person assist for supine to/from sit due to delayed initiation, pushing, and poor sitting balance.   Recommendation:  Patient will benefit from ongoing skilled PT services in skilled nursing facility setting to continue to advance safe functional mobility, address ongoing impairments in awareness, L attention, sitting balance, transfers, safety, WC mobility, bed mobility, coordination, Neuromotor control, and minimize fall risk.  Equipment: No equipment provided  Reasons for discharge: discharge from hospital  Patient/family agrees with progress made and goals achieved: Yes  PT Discharge Precautions/Restrictions   fall, L inattention Vital Signs Therapy Vitals Temp: 98.6 F (37 C) Pulse Rate: 81 Resp: 18 BP: (!) 163/80 Patient Position (if appropriate): Lying Oxygen Therapy SpO2: 95 % O2 Device: Room Air Pain Pain Assessment Pain Scale: 0-10 Pain Score: 0-No pain Vision/Perception  Vision - Assessment Ocular Range of Motion: Restricted on the left Alignment/Gaze Preference: Gaze right;Head turned Tracking/Visual Pursuits: Decreased smoothness of eye movement to LEFT superior field;Decreased smoothness of eye movement to LEFT inferior field;Impaired - to be further tested in functional context Perception Perception: Impaired Inattention/Neglect: Does not attend to left side of body;Does not attend to left visual field Praxis Praxis: Impaired Praxis Impairment Details:  Ideation;Initiation;Ideomotor;Motor planning;Perseveration  Cognition Orientation Level: Oriented to person;Oriented to place;Oriented to situation;Disoriented to time Sensation Sensation Light Touch: Impaired by gross assessment Light Touch Impaired Details: Absent LUE;Absent LLE (also impacted by L inattention) Hot/Cold: Not tested Proprioception: Impaired by gross assessment Stereognosis: Not tested Coordination Gross Motor Movements are Fluid and Coordinated: No Coordination and Movement Description: impaired due to L hemiparesis, L UE and LE hypertonia, impaired turnk control, L inattention, and impaired motor planning Motor  Motor Motor: Hemiplegia;Abnormal tone;Abnormal postural alignment and control Motor - Discharge Observations: L hemiparesis; L hypertonia with mild decrease in flexor tone in the LLE  Mobility Bed Mobility Bed Mobility: Supine to Sit;Sit to Supine;Rolling Right;Rolling Left Rolling Right: Total Assistance - Patient < 25% Rolling Left: Total Assistance - Patient < 25% Supine to Sit: Total Assistance - Patient < 25% Sit to Supine: Total Assistance - Patient < 25% Transfers Transfers: Sit to Stand;Stand to Sit Sit to Stand: Total Assistance - Patient < 25% (unable to come to a full stand) Stand to Sit: Total Assistance - Patient < 25% Squat Pivot Transfers: Total Assistance - Patient < 25% Locomotion  Gait Ambulation: No Gait Gait: No Stairs / Additional Locomotion Stairs: No Wheelchair Mobility Wheelchair Mobility: Yes Wheelchair Assistance: Dependent - Patient 0% Wheelchair Propulsion: Other (comment) Wheelchair Parts Management: Needs assistance [facility] Assessment  Cervical Assessment Cervical Assessment: Ex[facility] to [facility](R gaze preference; able to rotate L to midline) Thoracic Assessment Thoracic Assessment: Exceptions to [facility](thoracic kyphosis with rounded shoulders) Lumbar Assessment Lumbar Assessment:  [facility] to [facility](posterior pelvic tilt) Postural Control Postural Control: Deficits on evaluation Trunk Control: impaired with L posteiror lean/LOB requiring assist and R UE support to maintain upright Righting Reactions: delayed and insufficient with posterior bias Protective Responses: delayed and insufficient posteriorly and to  the L Postural Limitations: decreased with impaired core muscle strength  Balance Balance Balance Assessed: Yes Static Sitting Balance Static Sitting - Level of Assistance: 2: Max assist (up to max assist to prevent LOB) Dynamic Sitting Balance Dynamic Sitting - Balance Support: Feet supported Dynamic Sitting - Level of Assistance: 1: +1 Total assist Sitting balance - Comments: pt able to maintain static sitting balance with min assist occassionally, but becomes max-total A with any attempt at dynamic control with posterior/L pushing Extremity Assessment      RLE Assessment RLE Assessment: Exceptions to [facility] General Strength Comments: demonstrates occasional spasm type movement in R LE RLE Strength Right Hip Flexion: 3+/5 Right Knee Flexion: 4-/5 Right Knee Extension: 4/5 Right Ankle Dorsiflexion: 4-/5 Right Ankle Plantar Flexion: 4-/5 LLE Assessment LLE Assessment: Exceptions to [facility] Passive Range of Motion (PROM) Comments: flexor tone starts causing resistance to passive movement into extension but with time does demonstrate ability to rest LE flat on bed; mild improvement from EVal General Strength Comments: unable to formally assess strength due to severe L inattention and mild flexor tone LLE Tone LLE Tone: Hypertonic;Mild;Moderate    Lorie Phenix 04/27/2020, 11:09 PM

## 2020-04-28 ENCOUNTER — Inpatient Hospital Stay (HOSPITAL_COMMUNITY): Payer: Medicare Other

## 2020-04-28 ENCOUNTER — Inpatient Hospital Stay (HOSPITAL_COMMUNITY): Payer: Medicare Other | Admitting: Occupational Therapy

## 2020-04-28 LAB — GLUCOSE, CAPILLARY
Glucose-Capillary: 139 mg/dL — ABNORMAL HIGH (ref 70–99)
Glucose-Capillary: 135 mg/dL — ABNORMAL HIGH (ref 70–99)
Glucose-Capillary: 143 mg/dL — ABNORMAL HIGH (ref 70–99)
Glucose-Capillary: 214 mg/dL — ABNORMAL HIGH (ref 70–99)

## 2020-04-28 NOTE — Progress Notes (Signed)
[facility] PHYSICAL MEDICINE & REHABILITATION PROGRESS NOTE   Subjective/Complaints:   Working with SLP this am Oriented to person, place but not time   ROS: Patient denies CP, SOB, N/V/D Objective:   No results found. No results for input(s): WBC, HGB, HCT, PLT in the last 72 hours. No results for input(s): NA, K, CL, CO2, GLUCOSE, BUN, CREATININE, CALCIUM in the last 72 hours.  Intake/Output Summary (Last 24 hours) at 04/28/2020 1039 Last data filed at 04/28/2020 0900 Gross per 24 hour  Intake 478 ml  Output 800 ml  Net -322 ml     Physical Exam: Vital Signs Blood pressure (!) 146/85, pulse 78, temperature 98.8 F (37.1 C), resp. rate 18, height 5\' 7"  (1.702 m), weight 75.2 kg, SpO2 97 %.   Constitutional: No distress . Vital signs reviewed. HEENT: EOMI, oral membranes moist Neck: supple Cardiovascular: RRR without murmur. No JVD    Respiratory/Chest: CTA Bilaterally without wheezes or rales. Normal effort    GI/Abdomen: BS +, non-tender, non-distended Ext: no clubbing, cyanosis, or edema Psych: flat but cooperative Skin: No evidence of breakdown, no evidence of rash Musc: No edema in extremities.  No tenderness in extremities. Neurologic: alert and follows basic commands  Motor: Left facial weakness.  Motor: LUE/LLE: 0/5 proximal distal  Tone 1-2/4 left pecs, biceps, wrist. 1-2/4 LLE Hamstring MAS 3 on Left side   Assessment/Plan: 1. 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  which require 3+ hours per day of interdisciplinary therapy in a comprehensive inpatient rehab setting.  Physiatrist is providing close team supervision and 24 hour management of active medical problems listed below.  Physiatrist and rehab team continue to assess barriers to discharge/monitor patient progress toward functional and medical goals  Care Tool:  Bathing    Body parts bathed by patient: Abdomen, Face, Chest, Right lower leg   Body parts bathed by helper: Right arm,  Left arm, Chest, Abdomen, Front perineal area, Buttocks     Bathing assist Assist Level: Total Assistance - Patient < 25%     Upper Body Dressing/Undressing Upper body dressing   What is the patient wearing?: Pull over shirt    Upper body assist Assist Level: Dependent - Patient 0%    Lower Body Dressing/Undressing Lower body dressing      What is the patient wearing?: Pants     Lower body assist Assist for lower body dressing: Total Assistance - Patient < 25% (supine rolling)     Toileting Toileting Toileting Activity did not occur (Clothing management and hygiene only): N/A (no void or bm)  Toileting assist Assist for toileting: Dependent - Patient 0%     Transfers Chair/bed transfer  Transfers assist  Chair/bed transfer activity did not occur: Safety/medical concerns  Chair/bed transfer assist level: Dependent - mechanical lift     Locomotion Ambulation   Ambulation assist   Ambulation activity did not occur: Safety/medical concerns          Walk 10 feet activity   Assist  Walk 10 feet activity did not occur: Safety/medical concerns        Walk 50 feet activity   Assist Walk 50 feet with 2 turns activity did not occur: Safety/medical concerns         Walk 150 feet activity   Assist Walk 150 feet activity did not occur: Safety/medical concerns         Walk 10 feet on uneven surface  activity   Assist Walk 10 feet on uneven surfaces  activity did not occur: Safety/medical concerns         Wheelchair     Assist Will patient use wheelchair at discharge?:  (TBD)             Wheelchair 50 feet with 2 turns activity    Assist            Wheelchair 150 feet activity     Assist          Blood pressure (!) 146/85, pulse 78, temperature 98.8 F (37.1 C), resp. rate 18, height 5\' 7"  (1.702 m), weight 75.2 kg, SpO2 97 %.  Medical Problem List and Plan: 1.  Left side hemiplegia, now with spasticity, with  dysarthria and dysphagia secondary to right MCA infarction status post TPA and IR with TIC13 revascularization reoccluded on follow-up, infarct embolic secondary to unknown source.  Status post loop recorder  Continue CIR  PT, OT, SLP   WHO ordered  Spastic left hemiparesis, esp LUE: Baclofen was stopped d/t neuro-sedation.    -began trial of low dose tizanidine 7/16, tolerating so far Stable for d/c in am  2.  Antithrombotics: -DVT/anticoagulation: Subcutaneous heparin, reduce to q12 d/t bruising/pain  -recent dopplers clear             -antiplatelet therapy: Aspirin 81 mg daily 3. Pain Management: Tylenol as needed. Appears to be well controlled.              Monitor for headaches with increased activity. 4. Mood: Amantadine 150 mg daily for stimulation, tremors may be side effects D/Ced             -antipsychotic agents: N/A 5. Neuropsych: This patient is not capable of making decisions on her own behalf. 6. Skin/Wound Care: Routine skin checks 7. Fluids/Electrolytes/Nutrition: Routine in and outs. Protein and albumin are low on BMP 7/13. Ordered dietary consult. 8.  Hypertension.  Lopressor 25 mg twice daily, Norvasc 10 mg daily.    Vitals:   04/27/20 2028 04/28/20 0353  BP: (!) 163/80 (!) 146/85  Pulse: 81 78  Resp: 18 18  Temp: 98.6 F (37 C) 98.8 F (37.1 C)  SpO2: 95% 97%   Lisinopril 2.5 nightly started on 7/10  7/17: held lisinopril given tizanidine trial, bp creeping up may need to resume, if BP remains elevated   9.  Post stroke dysphagia: Dysphagia #1 honey thick liquids.  Patient on IV fluids for hydration follow-up speech therapy             Advance diet as tolerated 10.  Hemorrhagic cystitis complicated by UTI.  Complete course of Ancef.  Follow-up outpatient urology 11.  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  Hemoglobin A1c 8.9.  Check blood sugars before meals and at bedtime CBG (last 3)  Recent Labs    04/27/20 1624 04/27/20 2101  04/28/20 0610  GLUCAP 180* 193* 139*   Metformin 500 daily  7/16: remains elevated increased Levemir to 22U bid.               7/18: increase levemir to 24u bid, am CBG controlled  12.  Hyperlipidemia.  Zetia/Pravacol 13.  Constipation. MiraLAX. Adjust bowel meds as necessary   LOS: 17 days A FACE TO FACE EVALUATION WAS PERFORMED  8/18 04/28/2020, 10:39 AM

## 2020-04-28 NOTE — Progress Notes (Signed)
Patient ID: Meredith Shaw, female   DOB: 14-Apr-1946, 74 y.o.   MRN: [id number]   Sw followed up with patient. Daughter [name]) and staff at bedside assisting with feeding. Daughter had a few questions regarding her mothers physicians (attending and follow up) would like a medical update. Would like to speak with cardiovascular doctor. Daughter also would like reports of patients loop recorder, or who she would need to speak with for report of that. Dtr also informed sw she would not like a late discharge on Wednesday. Sw will follow up with SNF once insurance authorization has gone through.

## 2020-04-28 NOTE — Progress Notes (Signed)
Patient ID: Meredith Shaw, female   DOB: [birth year], 74 y.o.   MRN: [id number]   Patient potentially set to discharge to SNF ([facility]) on Wednesday, pending authorization!

## 2020-04-28 NOTE — Progress Notes (Signed)
Shaw ID: Meredith Shaw, female   DOB: Nov 25, 1945, 74 y.o.   MRN: [id number]   Daughter in agreement with Shaw transitioning to [facility] this week (projected Tuesday or Wednesday). SW waiting on follow up from [facility] AD to schedule tranfer. Will follow up and keep everyone updated.

## 2020-04-28 NOTE — Progress Notes (Signed)
Speech Language Pathology Discharge Summary  Patient Details  [name] MRN: [id number] Date of Birth: 19-Oct-1945  Patient has met 8 of 8 long term goals.  Patient to discharge at overall Max;Min level.  Reasons goals not met:     Clinical Impression/Discharge Summary:   Pt made progress meeting 8 out 8 goals, however goals were downgraded due to slower than expected progress. Pt demonstrated improved oropharyngeal function with diet upgrade from honey thick liquids to nectar thick liquids and participating in water protocol. Pt will required repeat instrumental study prior to liquid advance due to deep penetration and leading to silent aspiration of thin liquids, with her f/u ST. Pt demonstrated improved oral control with solids remaining on dys 1 textures but requires reduced cuing to manipulate, control and clear oral cavity. Pt demonstrated improvement in speech intelligibility at the phrase level, but continues to demonstrate reduce verbal output mainly responding to [name] questions at word or simple phrase level. Pt's cognitive skills have demonstrated slow progress, pt continues to demonstrate deficits in sustained attention/alertness, basic problem solving, left attention, recall and intellectual awareness further impacted by lethargy. Pt benefited from skills ST services in order to maximize functional independence and reduce burden of care, requiring 24 hour supervision and continued ST services.  Care Partner:  Caregiver Able to Provide Assistance: Yes  Type of Caregiver Assistance: Physical;Cognitive  Recommendation:  Skilled Nursing facility;24 hour supervision/assistance  Rationale for SLP Follow Up: Maximize functional communication;Maximize cognitive function and independence;Maximize swallowing safety;Reduce caregiver burden   Equipment: N/A   Reasons for discharge: Discharged from hospital   Patient/Family Agrees with Progress Made and Goals Achieved: Yes     [name]  [facility] 04/28/2020, 12:21 PM

## 2020-04-28 NOTE — Progress Notes (Signed)
Occupational Therapy Session Note  Patient Details  [name] MRN: [id number] Date of Birth: 1946-02-18  Today's Date: 04/28/2020 OT Individual Time: 5501-5868 OT Individual Time Calculation (min): 60 min    Short Term Goals: Week 1:  OT Short Term Goal 1 (Week 1): Pt will sit EOB/EOM for 5 min with MOD A of 1 for sitting balance during functional activity OT Short Term Goal 1 - Progress (Week 1): Not met OT Short Term Goal 2 (Week 1): Pt will terminate washing face with no VC OT Short Term Goal 2 - Progress (Week 1): Not met OT Short Term Goal 3 (Week 1): Pt will locate 2/2 grooming items on L side of sink wiht min VC OT Short Term Goal 3 - Progress (Week 1): Not met OT Short Term Goal 4 (Week 1): Pt will complete 1/4 steps of donning shirt with multimodal cuing OT Short Term Goal 4 - Progress (Week 1): Not met OT Short Term Goal 5 (Week 1): Pt will transfer to w/c with MAX A of 2 in prep for [facility] transfer OT Short Term Goal 5 - Progress (Week 1): Not met  Skilled Therapeutic Interventions/Progress Updates:    1:1. Pt received in bed agreeable to OT. OT provided PROM of LUE primarily focusing on stretching against flexor synergy to decrease risk of contracture and manage tone. Pt agreeable to washing up. Bed level bathing performed with total A provided d/t decreased initiation, continuation, sequencing and attention. Pt able to wash part of chest/abdomen and all of face with VC for washing L side. Pt dons gown with max A threading RUE, but requries A for all other steps. Pt rolls in B directions for peri washing and buttock hygeine with MAX A to R and L with total A. Pt self feeds supervision and A to intervene 25% of time to decrease bite or sip size with items set on in middle of tray to improve visual scanning. OT provided total A to insert dentures. Pt demoed 1 instance of coughing with nectar thick liquids. Exited session with pt seated in bed, exit alarm on and call light in  reach  Therapy Documentation Precautions:  Precautions Precautions: Fall, Other (comment) Precaution Comments: L hemi with hypertonia and L inattention Restrictions Weight Bearing Restrictions: No General:   Vital Signs:  Pain:   ADL:   Vision   Perception    Praxis   Exercises:   Other Treatments:     Therapy/Group: Individual Therapy  Tonny Branch 04/28/2020, 8:28 AM

## 2020-04-28 NOTE — Progress Notes (Signed)
Occupational Therapy Session Note  Patient Details  [name] MRN: [id number] Date of Birth: 10-24-1945  Today's Date: 04/28/2020 OT Individual Time: 1415-1500 OT Individual Time Calculation (min): 45 min    Short Term Goals: Week 2:  OT Short Term Goal 1 (Week 2): Pt will complete 1/4 steps of donning shirt with multimodal cuing. OT Short Term Goal 2 (Week 2): Pt will locate 2/2 grooming items on L side of sink with min VCs. OT Short Term Goal 3 (Week 2): Pt will maintaining sitting balance for 5 minutes on EOB with mod A for self care tasks.  Skilled Therapeutic Interventions/Progress Updates:    Pt received in wc with family in the room.  Used this session for family education on techniques they can use and that they can encourage the staff at the next facility to use for postural control, visual scanning, attention to L side:  -Sitting in w/c with elbows on tray table to faciliate forward lean -hand over hand guidance to "polish" the table with a towel for reaching and stretching with one hand at a time - body on arm stretching with trunk rotation - weight shifts L and R   Pt moved back to bed with +2 total A using slide board. Pt positioned in L sidelying with pillow supports.      Bed alarm set and all needs met.   Therapy Documentation Precautions:  Precautions Precautions: Fall, Other (comment) Precaution Comments: L hemi with hypertonia and L inattention Restrictions Weight Bearing Restrictions: No  Pain: Pain Assessment Pain Scale: Faces Faces Pain Scale: No hurt   Therapy/Group: Individual Therapy  [place] 04/28/2020, 8:55 AM

## 2020-04-28 NOTE — Progress Notes (Signed)
Speech Language Pathology Daily Session Note  Patient Details  Name: IANNA SALMELA MRN: [id number] Date of Birth: [birth year]  Today's Date: 04/28/2020 SLP Individual Time: 1002-1059 SLP Individual Time Calculation (min): 57 min  Short Term Goals: Week 3: SLP Short Term Goal 1 (Week 3): STGs=LTGs due to ELOS  Skilled Therapeutic Interventions:Skilled ST services focused on education, swallow and cognitive skills. SLP provided education on current goals and progress in swallow, verbal expression and cognitive function. Pt feel asleep at 10 minutes, however was easily awoken and remained awake for the remainder of the treatment session. SLP facilitated oral care prior to thin liquid trials via straw, pt demonstrated immediate and delayed cough x3 out of 10 trials. Pt also demonstrated intermittent oral holding and multiple swallows. SLP also facilitated assessment of cognitive linguistic skills given formal assessment SLUMS pt scored 4 out 30 indicting severe impairment in sustained attention, memory, problem solving and error awareness. Pt was left in room with call bell within reach and bed alarm set. ST recommends to continue skilled ST services.     Pain Pain Assessment Pain Scale: Faces Pain Score: 0-No pain Faces Pain Scale: No hurt  Therapy/Group: Individual Therapy  Carleena Mires  [facility] 04/28/2020, 12:20 PM

## 2020-04-28 NOTE — Progress Notes (Signed)
Pt required disimpaction for removal of stool. Order obtained for soap suds enema from [place], [place]. Administered at 1700 with moderate results. Pt tolerated well. Continue plan of care.    Marylu Lund, RN

## 2020-04-28 NOTE — Progress Notes (Signed)
Physical Therapy Session Note  Patient Details  [name] MRN: [id number] Date of Birth: 1946/07/26  Today's Date: 04/28/2020 PT Individual Time: 1300-1345 PT Individual Time Calculation (min): 45 min   Short Term Goals: Week 2:  PT Short Term Goal 1 (Week 2): Pt will demonstrate ability to maintain static sitting EOB with close supervision for 5 minutes PT Short Term Goal 2 (Week 2): Pt will perform supine<>sit with max assist of 1 PT Short Term Goal 3 (Week 2): pt to demonstrate transfer to/from bed<>chair at mod A x1  Skilled Therapeutic Interventions/Progress Updates:    Session focused on NMR to address bed mobility, sitting balance, postural control re-training, reorientation to midline, transfer to w/c, and positioning for improved posture and sitting tolerance.   Pt requires total assist for bed mobility and heavy +2 assist for supine to sit. Pt with strong pushing posteriorly and lean to the L despite multimodal cues to promote flexion at trunk. Unable to achieve midline reorientation for any significant period of time and requires total assist to maintain balance. Total +2 for slideboard into w/c with continued strong posterior and L lateral lean despite multimodal cues and facilitation. Once in w/c requires total +2 to reposition. Utilized pillows and foam noodle for positioning and maintaining upright posture. Use of mirror to promote visual feedback for cues for head alignment. Passive ROM to LUE to decrease tone and increase muscle flexibility. Positioned in a way to allow for family to visit and sit on her R side to promote attention and head turns to the R due to bias to the L.  Therapy Documentation Precautions:  Precautions Precautions: Fall, Other (comment) Precaution Comments: L hemi with hypertonia and L inattention Restrictions Weight Bearing Restrictions: No Pain: Reports some pain in LLE described as cramping. Repositioned as able.     Therapy/Group:  Individual Therapy  Karolee Stamps Darrol Poke, PT, DPT, CBIS  04/28/2020, 3:15 PM

## 2020-04-29 ENCOUNTER — Inpatient Hospital Stay (HOSPITAL_COMMUNITY): Payer: Medicare Other | Admitting: Physical Therapy

## 2020-04-29 ENCOUNTER — Inpatient Hospital Stay (HOSPITAL_COMMUNITY): Payer: Medicare Other | Admitting: Occupational Therapy

## 2020-04-29 LAB — GLUCOSE, CAPILLARY
Glucose-Capillary: 110 mg/dL — ABNORMAL HIGH (ref 70–99)
Glucose-Capillary: 215 mg/dL — ABNORMAL HIGH (ref 70–99)
Glucose-Capillary: 174 mg/dL — ABNORMAL HIGH (ref 70–99)
Glucose-Capillary: 121 mg/dL — ABNORMAL HIGH (ref 70–99)

## 2020-04-29 NOTE — Progress Notes (Signed)
Patient ID: Meredith Shaw, female   DOB: [birth year], 74 y.o.   MRN: [id number]   Sw awaiting insurance approval or denial. Once decision made sw will move forward with confirming d/c date and scheduling COVID test.

## 2020-04-29 NOTE — Progress Notes (Signed)
Physical Therapy Session Note  Patient Details  [name] MRN: [id number] Date of Birth: 1946/01/27  Today's Date: 04/29/2020 PT Individual Time: 0805-0850 PT Individual Time Calculation (min): 45 min   Short Term Goals: Week 2:  PT Short Term Goal 1 (Week 2): Pt will demonstrate ability to maintain static sitting EOB with close supervision for 5 minutes PT Short Term Goal 2 (Week 2): Pt will perform supine<>sit with max assist of 1 PT Short Term Goal 3 (Week 2): pt to demonstrate transfer to/from bed<>chair at mod A x1  Skilled Therapeutic Interventions/Progress Updates: Pt presented in bed completing breakfast and agreeable to therapy. Performed rolling L/R max to total A for donning pants. Pt's shirt also donned total A at supine level. Performed supine to sit maxA and then required +2 assistance for sitting balance due to posterior lean. Attempted squat pivot transfer to TIS however due to increased pushing tendencies pt required third person to complete transfer to TIS. Pt transported to 4N hallway to work on scanning however pt noted to be lethargic and despite max multimodal cues to scan for objects unable to keep eyes open for any extended period of time. Pt then transported to rehab gym for increased stimulation. PTA used wet washcloth however pt unable to stay aroused. Pt transported back to room and remained in TIS with belt alarm on, call bell within reach and needs met.      Therapy Documentation Precautions:  Precautions Precautions: Fall, Other (comment) Precaution Comments: L hemi with hypertonia and L inattention Restrictions Weight Bearing Restrictions: No General: PT Amount of Missed Time (min): 30 Minutes PT Missed Treatment Reason: Patient fatigue   Therapy/Group: Individual Therapy  Doctor Sheahan 04/29/2020, 9:01 AM

## 2020-04-29 NOTE — Progress Notes (Signed)
[place] PHYSICAL MEDICINE & REHABILITATION PROGRESS NOTE   Subjective/Complaints:     ROS: Patient denies CP, SOB, N/V/D Objective:   No results found. No results for input(s): WBC, HGB, HCT, PLT in the last 72 hours. No results for input(s): NA, K, CL, CO2, GLUCOSE, BUN, CREATININE, CALCIUM in the last 72 hours.  Intake/Output Summary (Last 24 hours) at 04/29/2020 0859 Last data filed at 04/29/2020 0543 Gross per 24 hour  Intake 540 ml  Output 1000 ml  Net -460 ml     Physical Exam: Vital Signs Blood pressure (!) 153/91, pulse 75, temperature 98.3 F (36.8 C), resp. rate 18, height 5\' 7"  (1.702 m), weight 73.2 kg, SpO2 97 %.    General: No acute distress Mood and affect are appropriate Heart: Regular rate and rhythm no rubs murmurs or extra sounds Lungs: Clear to auscultation, breathing unlabored, no rales or wheezes Abdomen: Positive bowel sounds, soft nontender to palpation, nondistended Extremities: No clubbing, cyanosis, or edema Skin: No evidence of breakdown, no evidence of rash  Neurologic: alert and follows basic commands  Motor: Left facial weakness.  Motor: LUE/LLE: 0/5 proximal distal  Tone 1-2/4 left pecs, biceps, wrist. 1-2/4 LLE Hamstring MAS 3 on Left side   Assessment/Plan: 1. 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   Stable for D/C today to SNF  F/u PCP in 3-4 weeks Need neuro f/u in ~69mo F/u PM&R 2 weeks See D/C summary See D/C instructions Care Tool:  Bathing    Body parts bathed by patient: Abdomen, Face, Chest, Right lower leg   Body parts bathed by helper: Right arm, Left arm, Chest, Abdomen, Front perineal area, Buttocks     Bathing assist Assist Level: Total Assistance - Patient < 25%     Upper Body Dressing/Undressing Upper body dressing   What is the patient wearing?: Pull over shirt    Upper body assist Assist Level: Dependent - Patient 0%    Lower Body Dressing/Undressing Lower body dressing      What  is the patient wearing?: Pants     Lower body assist Assist for lower body dressing: Total Assistance - Patient < 25% (supine rolling)     Toileting Toileting Toileting Activity did not occur (Clothing management and hygiene only): N/A (no void or bm)  Toileting assist Assist for toileting: Dependent - Patient 0%     Transfers Chair/bed transfer  Transfers assist  Chair/bed transfer activity did not occur: Safety/medical concerns  Chair/bed transfer assist level: 2 Helpers     Locomotion Ambulation   Ambulation assist   Ambulation activity did not occur: Safety/medical concerns          Walk 10 feet activity   Assist  Walk 10 feet activity did not occur: Safety/medical concerns        Walk 50 feet activity   Assist Walk 50 feet with 2 turns activity did not occur: Safety/medical concerns         Walk 150 feet activity   Assist Walk 150 feet activity did not occur: Safety/medical concerns         Walk 10 feet on uneven surface  activity   Assist Walk 10 feet on uneven surfaces activity did not occur: Safety/medical concerns         Wheelchair     Assist Will patient use wheelchair at discharge?:  (TBD)      Wheelchair assist level: Dependent - Patient 0% (TIS)      Wheelchair 50 feet with  2 turns activity    Assist            Wheelchair 150 feet activity     Assist      Assist Level: Dependent - Patient 0%   Blood pressure (!) 153/91, pulse 75, temperature 98.3 F (36.8 C), resp. rate 18, height 5\' 7"  (1.702 m), weight 73.2 kg, SpO2 97 %.  Medical Problem List and Plan: 1.  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  Status post loop recorder  Continue CIR  PT, OT, SLP   WHO ordered  Spastic left hemiparesis, esp LUE:  Baclofen was stopped d/t neuro-sedation.    -began trial of low dose tizanidine 7/16, tolerating so far Stable for d/c today   2.  Antithrombotics: -DVT/anticoagulation: Subcutaneous heparin, reduce to q12 d/t bruising/pain  -recent dopplers clear             -antiplatelet therapy: Aspirin 81 mg daily 3. Pain Management: Tylenol as needed. Appears to be well controlled.              Monitor for headaches with increased activity. 4. Mood: Amantadine 150 mg daily for stimulation, tremors may be side effects D/Ced             -antipsychotic agents: N/A 5. Neuropsych: This patient is not capable of making decisions on her own behalf. 6. Skin/Wound Care: Routine skin checks 7. Fluids/Electrolytes/Nutrition: Routine in and outs. Protein and albumin are low on BMP 7/13. Ordered dietary consult. 8.  Hypertension.  Lopressor 25 mg twice daily, Norvasc 10 mg daily.    Vitals:   04/28/20 2010 04/29/20 0337  BP: (!) 134/91 (!) 153/91  Pulse: 95 75  Resp: 18 18  Temp: 98.9 F (37.2 C) 98.3 F (36.8 C)  SpO2: 96% 97%   Lisinopril 2.5 nightly started on 7/10  7/17: held lisinopril given tizanidine trial, bp creeping up may need to resume, if BP remains elevated   9.  Post stroke dysphagia: Dysphagia #1 honey thick liquids.  Patient on IV fluids for hydration follow-up speech therapy             Advance diet as tolerated 10.  Hemorrhagic cystitis complicated by UTI.  Complete course of Ancef.  Follow-up outpatient urology 11.  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  Hemoglobin A1c 8.9.  Check blood sugars before meals and at bedtime CBG (last 3)  Recent Labs    04/28/20 1704 04/28/20 2101 04/29/20 0630  GLUCAP 143* 214* 110*   Metformin 500 daily  7/16: remains elevated increased Levemir to 22U bid.               7/18: increase levemir to 24u bid, am CBG controlled , 7/20 overall fair to good control, cont current dose  12.  Hyperlipidemia.  Zetia/Pravacol 13.   Constipation. MiraLAX. Adjust bowel meds as necessary   LOS: 18 days A FACE TO FACE EVALUATION WAS PERFORMED  8/20 04/29/2020, 8:59 AM

## 2020-04-29 NOTE — Progress Notes (Signed)
Nutrition Follow-up  DOCUMENTATION CODES:   Not applicable  INTERVENTION:   - Magic cup TID with meals, each supplement provides 290 kcal and 9 grams of protein  - Continue MVI with minerals daily  - Vital Cuisine Shake BID, each supplement provides 520 kcal and 22 grams of protein  NUTRITION DIAGNOSIS:   Increased nutrient needs related to other (therapies) as evidenced by estimated needs.  Ongoing  GOAL:   Patient will meet greater than or equal to 90% of their needs  Progressing  MONITOR:   PO intake, Supplement acceptance, Diet advancement, Labs, Weight trends, Skin  REASON FOR ASSESSMENT:   Consult Assessment of nutrition requirement/status  ASSESSMENT:   74 year old female with PMH of T2DM complicated by retinopathy and HTN. Pt presented on 03/23/20 with dysarthria and left hemiparesis. CT of the head was negative and TPA was administered. 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 Pt underwent cerebral angiogram with revascularization by IR and did require short-term intubation. Pt started on dysphagia 1 diet with honey-thick liquids and nasogastric tube removed 04/05/20.  7/14 - diet advanced to dysphagia 1, nectar-thick liquids  Noted potential d/c to SNF on Wednesday. Noted pt required soap suds enema yesterday and manual disimpaction.  Weight stable since last RD visit.  RD will add Hormel Shakes BID with lunch and dinner meals to aid pt in meeting kcal and protein needs.  Meal Completion: 30-100% (averaging 67%)  Medications reviewed and include: dulcolax, SSI, Levemir 24 units BID, Metformin, MVI with minerals, protonix, Klor-con, senna  Labs reviewed. CBG's: 110-215 x 24 hours  Diet Order:   Diet Order            DIET - DYS 1 Room service appropriate? Yes with Assist; Fluid consistency: Nectar Thick  Diet effective now                 EDUCATION NEEDS:   No education needs have been  identified at this time  Skin:  Skin Assessment: Skin Integrity Issues: Stage I: buttocks Incisions: chest, groin  Last BM:  04/28/20 large type 2  Height:   Ht Readings from Last 1 Encounters:  04/11/20 5\' 7"  (1.702 m)    Weight:   Wt Readings from Last 1 Encounters:  04/29/20 73.2 kg    Ideal Body Weight:  61.4 kg  BMI:  Body mass index is 25.28 kg/m.  Estimated Nutritional Needs:   Kcal:  1800-2000  Protein:  85-100 grams  Fluid:  >/= 1.8 L    05/01/20, MS, RD, LDN Inpatient Clinical Dietitian Please see [facility] for contact information.

## 2020-04-29 NOTE — Progress Notes (Signed)
Occupational Therapy Session Note  Patient Details  [name] MRN: [id number] Date of Birth: [birth year]  Today's Date: 04/29/2020 OT Individual Time: 1000-1053 and 1346-1444 OT Individual Time Calculation (min): 53 min and 58 mins   Short Term Goals: Week 2:  OT Short Term Goal 1 (Week 2): Pt will complete 1/4 steps of donning shirt with multimodal cuing. OT Short Term Goal 2 (Week 2): Pt will locate 2/2 grooming items on L side of sink with min VCs. OT Short Term Goal 3 (Week 2): Pt will maintaining sitting balance for 5 minutes on EOB with mod A for self care tasks.  Skilled Therapeutic Interventions/Progress Updates:    Session 1: Upon entering the room, pt sitting in tilt in space wheelchair and appears lethargic. Multiple attempts made to alert pt before she is able to engage with therapist. OT assists pt out of room and to day room for quiet environment. OT pacing large colored objects on table top for visual scanning task. Object placed at midline with pt unable to locate or verbalize color. Object placed directly in R visual field and pt able to locate and hand to therapist as well as verbalize each color of three objects with increased time and mod multimodal cuing. Pt verbalized pain in R LE and RN notified. OT able to gently stretch L digits, wrist, and elbow x 5 but pt reports pain on occasion with this as well. OT positioned pt with window on L side but again pt uinable to visually locate with physical head turn or scanning of eyes. Pt also believing it to be late afternoon vs morning. Pt did report being in the hospital but then verbalized living at home with her mom and dad which is clearly not accurate. OT returned pt back to room with chair alarm belt donned and activated and call bell placed into lap before exiting the room.   Session 2: Upon entering the room, pt seated in tilt in space with daughter and husband present. Pt much more alert this session than  previously. Pt reports pain in R neck and shoulder with therapist manually massaging area and then applying heat monitored for 15 minutes. Caregiver's verbalized how the weekend was and concerns moving forward with skilled intervention. OT taking pt out of the room and pt remained in wheelchair at table top and able to reach laterally and anteriorly for cone and place it just past midline towards L. Pt moves entire stack from one side to the other and back again with increased time and mod verbal cuing for initiation of task but pt completed. Pt does report fatigue and assessed back to room. OT set up transfer and second helper stabilizing slide board with total A from therapist to return pt to bed. Sit >supine with total A and second helper to reposition pt in bed and protect hemiplegic side. Bed alarm activated and call bell within reach upon exiting the room.   Therapy Documentation Precautions:  Precautions Precautions: Fall, Other (comment) Precaution Comments: L hemi with hypertonia and L inattention Restrictions Weight Bearing Restrictions: No General: General PT Missed Treatment Reason: Patient fatigue   Therapy/Group: Individual Therapy  Alen Bleacher 04/29/2020, 12:31 PM

## 2020-04-30 ENCOUNTER — Inpatient Hospital Stay (HOSPITAL_COMMUNITY): Payer: Medicare Other | Admitting: Occupational Therapy

## 2020-04-30 ENCOUNTER — Inpatient Hospital Stay (HOSPITAL_COMMUNITY): Payer: Medicare Other | Admitting: Physical Therapy

## 2020-04-30 LAB — GLUCOSE, CAPILLARY
Glucose-Capillary: 144 mg/dL — ABNORMAL HIGH (ref 70–99)
Glucose-Capillary: 212 mg/dL — ABNORMAL HIGH (ref 70–99)
Glucose-Capillary: 167 mg/dL — ABNORMAL HIGH (ref 70–99)

## 2020-04-30 MED ORDER — SENNOSIDES-DOCUSATE SODIUM 8.6-50 MG PO TABS
2.0000 | ORAL_TABLET | Freq: Two times a day (BID) | ORAL | Status: DC
  Administered 2020-04-30 – 2020-05-05 (×10): 2 via ORAL
  Filled 2020-04-30 (×10): qty 2

## 2020-04-30 NOTE — Progress Notes (Signed)
Patient ID: Meredith Shaw, female   DOB: 07-16-1946, 74 y.o.   MRN: [id number]   Sw verified with insurance company that patient is current in Inpatient  Rehabilitation.  Authorization number: [id number] [id number]

## 2020-04-30 NOTE — Progress Notes (Signed)
[place] PHYSICAL MEDICINE & REHABILITATION PROGRESS NOTE   Subjective/Complaints:   Up in tilt in space chair watching TV  ROS: Patient denies CP, SOB, N/V/D Objective:   No results found. No results for input(s): WBC, HGB, HCT, PLT in the last 72 hours. No results for input(s): NA, K, CL, CO2, GLUCOSE, BUN, CREATININE, CALCIUM in the last 72 hours.  Intake/Output Summary (Last 24 hours) at 04/30/2020 0845 Last data filed at 04/29/2020 1959 Gross per 24 hour  Intake 200 ml  Output 450 ml  Net -250 ml     Physical Exam: Vital Signs Blood pressure (!) 148/64, pulse 75, temperature 98.6 F (37 C), resp. rate 16, height 5\' 7"  (1.702 m), weight 76.5 kg, SpO2 96 %.    General: No acute distress Mood and affect are appropriate Heart: Regular rate and rhythm no rubs murmurs or extra sounds Lungs: Clear to auscultation, breathing unlabored, no rales or wheezes Abdomen: Positive bowel sounds, soft nontender to palpation, nondistended Extremities: No clubbing, cyanosis, or edema Skin: No evidence of breakdown, no evidence of rash  Neurologic: alert and follows basic commands  Motor: Left facial weakness.  Motor: LUE/LLE: 0/5 proximal distal  Tone 1-2/4 left pecs, biceps, wrist. 1-2/4 LLE Hamstring MAS 3 on Left side   Assessment/Plan: 1. 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   Stable for D/C today to SNF  F/u PCP in 3-4 weeks Need neuro f/u in ~50mo F/u PM&R 2 weeks See D/C summary See D/C instructions Care Tool:  Bathing    Body parts bathed by patient: Abdomen, Face, Chest, Right lower leg   Body parts bathed by helper: Right arm, Left arm, Chest, Abdomen, Front perineal area, Buttocks     Bathing assist Assist Level: Total Assistance - Patient < 25%     Upper Body Dressing/Undressing Upper body dressing   What is the patient wearing?: Pull over shirt    Upper body assist Assist Level: Dependent - Patient 0%    Lower Body  Dressing/Undressing Lower body dressing      What is the patient wearing?: Pants     Lower body assist Assist for lower body dressing: Total Assistance - Patient < 25% (supine rolling)     Toileting Toileting Toileting Activity did not occur (Clothing management and hygiene only): N/A (no void or bm)  Toileting assist Assist for toileting: Dependent - Patient 0%     Transfers Chair/bed transfer  Transfers assist  Chair/bed transfer activity did not occur: Safety/medical concerns  Chair/bed transfer assist level: 2 Helpers     Locomotion Ambulation   Ambulation assist   Ambulation activity did not occur: Safety/medical concerns          Walk 10 feet activity   Assist  Walk 10 feet activity did not occur: Safety/medical concerns        Walk 50 feet activity   Assist Walk 50 feet with 2 turns activity did not occur: Safety/medical concerns         Walk 150 feet activity   Assist Walk 150 feet activity did not occur: Safety/medical concerns         Walk 10 feet on uneven surface  activity   Assist Walk 10 feet on uneven surfaces activity did not occur: Safety/medical concerns         Wheelchair     Assist Will patient use wheelchair at discharge?: Yes Type of Wheelchair: Manual    Wheelchair assist level: Dependent - Patient 0%  Wheelchair 50 feet with 2 turns activity    Assist        Assist Level: Dependent - Patient 0%   Wheelchair 150 feet activity     Assist      Assist Level: Dependent - Patient 0%   Blood pressure (!) 148/64, pulse 75, temperature 98.6 F (37 C), resp. rate 16, height 5\' 7"  (1.702 m), weight 76.5 kg, SpO2 96 %.  Medical Problem List and Plan: 1.  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  Status  post loop recorder  Continue CIR  PT, OT, SLP   WHO ordered  Spastic left hemiparesis, esp LUE: Baclofen was stopped d/t neuro-sedation.    -began trial of low dose tizanidine 7/16, tolerating so far Stable for d/c today   2.  Antithrombotics: -DVT/anticoagulation: Subcutaneous heparin, reduce to q12 d/t bruising/pain  -recent dopplers clear             -antiplatelet therapy: Aspirin 81 mg daily 3. Pain Management: Tylenol as needed. Appears to be well controlled.              Monitor for headaches with increased activity. 4. Mood: Amantadine 150 mg daily for stimulation, tremors may be side effects D/Ced             -antipsychotic agents: N/A 5. Neuropsych: This patient is not capable of making decisions on her own behalf. 6. Skin/Wound Care: Routine skin checks 7. Fluids/Electrolytes/Nutrition: Routine in and outs. Protein and albumin are low on BMP 7/13. Ordered dietary consult. 8.  Hypertension.  Lopressor 25 mg twice daily, Norvasc 10 mg daily.    Vitals:   04/29/20 1942 04/30/20 0326  BP: 118/83 (!) 148/64  Pulse: 81 75  Resp: 16 16  Temp: 98.5 F (36.9 C) 98.6 F (37 C)  SpO2: 96% 96%   Lisinopril 2.5 nightly started on 7/10  7/17: held lisinopril given tizanidine trial, bp creeping up may need to resume, if BP remains elevated   9.  Post stroke dysphagia: Dysphagia #1 honey thick liquids.  Patient on IV fluids for hydration follow-up speech therapy             Advance diet as tolerated 10.  Hemorrhagic cystitis complicated by UTI.  Complete course of Ancef.  Follow-up outpatient urology 11.  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  Hemoglobin A1c 8.9.  Check blood sugars before meals and at bedtime CBG (last 3)  Recent Labs    04/29/20 1658 04/29/20 2054 04/30/20 0621  GLUCAP 174* 121* 144*   Metformin 500 daily  7/16: remains elevated increased Levemir to 22U bid.               7/18: increase levemir to 24u bid, am CBG controlled , 7/21  overall good control, cont current dose  12.  Hyperlipidemia.  Zetia/Pravacol 13.  Constipation. MiraLAX. Adjust bowel meds as necessary   LOS: 19 days A FACE TO FACE EVALUATION WAS PERFORMED  8/21 04/30/2020, 8:45 AM

## 2020-04-30 NOTE — Progress Notes (Signed)
Occupational Therapy Session Note  Patient Details  [name] MRN: [id number] Date of Birth: May 11, 1946  Today's Date: 04/30/2020 OT Individual Time: [phone] and 2119-4174 OT Individual Time Calculation (min): 54 min and 19 minutes with 25 missed minutes secondary to pain   Short Term Goals: Week 2:  OT Short Term Goal 1 (Week 2): Pt will complete 1/4 steps of donning shirt with multimodal cuing. OT Short Term Goal 2 (Week 2): Pt will locate 2/2 grooming items on L side of sink with min VCs. OT Short Term Goal 3 (Week 2): Pt will maintaining sitting balance for 5 minutes on EOB with mod A for self care tasks.  Skilled Therapeutic Interventions/Progress Updates:    Session 1: Upon entering the room, pt supine in bed and alert for OT intervention. Pt rolling L <> R with total A and total A to don pull over pants. Supine >sit with total A to EOB. Pt with posterior bias and pushing while seated on EOB. Therapist seated next to pt and blocking LEs and placing L hand into lap with mod A sitting balance for ~ 7 minutes. Second helper placing slide board and total A of 1 to transfer pt via slide board into wheelchair with second helper stabilizing equipment. Pt engaged in bathing and UB dressing tasks with total A at sink with mirror for visual feedback. OT set up breakfast tray and pt feeding self several bites with R UE until full. Pt needing cuing to attend to task and encouragement to eat further. Pt remained in wheelchair with chair alarm belt and all needs within reach.   Session 2: Upon entering the room, pt supine in bed with husband present in room. Husband reports pt having just gotten back into bed since therapist got pt up around 0730. Pt verbalized pain in R hip from position in chair and OT checking skin with no concerns visible. OT repositioned pt for comfort and pt requesting to rest secondary to increased pain at this time. Bed alarm activated and call bell within reach.  Husband remains present in the room.   Therapy Documentation Precautions:  Precautions Precautions: Fall, Other (comment) Precaution Comments: L hemi with hypertonia and L inattention Restrictions Weight Bearing Restrictions: No   Therapy/Group: Individual Therapy  Alen Bleacher 04/30/2020, 12:49 PM

## 2020-04-30 NOTE — Patient Care Conference (Signed)
Inpatient RehabilitationTeam Conference and Plan of Care Update Date: 04/30/2020   Time: 1:48 PM    Patient [name]      Medical Record Number: [id number]  Date of Birth: 1946/08/21 Sex: Female         Room/Bed: 4W20C/4W20C-01 Payor Info: Payor: Advertising copywriter MEDICARE / Plan: [employer] MEDICARE / Product Type: *No Product type* /    Admit Date/Time:  04/11/2020  6:15 PM  Primary Diagnosis:  Acute ischemic right MCA stroke [facility])  Hospital Problems: Principal Problem:   Acute ischemic right MCA stroke (HCC) s/p tPA & attempted revascularization, embolic stroke, source unknown Active Problems:   Pressure injury of skin   Right middle cerebral artery stroke (HCC)   Uncontrolled type 2 diabetes mellitus with hyperglycemia (HCC)   Dysphagia, post-stroke   Labile blood pressure   Left hemiplegia (HCC)   Spastic hemiplegia affecting nondominant side (HCC)    Expected Discharge Date: Expected Discharge Date:  (SNF pending)  Team Members Present: Physician leading conference: Dr. Claudette Laws Care Coodinator Present: Chana Bode, RN, BSN, CRRN;Christina Mojave, BSW Nurse Present: Other (comment) Tyrone Nine, RN) PT Present: Harless Litten, PTA OT Present: Jackquline Denmark, OT SLP Present: Colin Benton, SLP PPS Coordinator present : Edson Snowball, PT     Current Status/Progress Goal Weekly Team Focus  Bowel/Bladder   Incontinent of bowel and bladder, Frequent episodes of constipation requiring disimpaction and straight caths  Regain control of bowel and bladder  Assess q shift and prn, straight cath per order   Swallow/Nutrition/ Hydration             ADL's   Total A of 2 for self care tasks and functional transfers with slide board, decreased visual scanning, attnetion, pusher syndrome, and fatigues easily  goals downgraded mod A feeding and grooming with max +2 for all other goals  functional transfers, NMR, functional cognition, trunk control, praxis    Mobility   max to total A bed mobility, max to total A x2 SB transfers, continues to demonstrate sigificant pushing, poor midline orientation, poor initation, low endurance  goals downgraded to modA sitting balance, max to total A for bed mobility and transfers  d/c planning, bed mobility, transfers, sitting balance   Communication             Safety/Cognition/ Behavioral Observations            Pain   No complaints of pain  Maintain pain free status  Assess q shift and prn   Skin   Masd to buttocks, barrier cream applied  maintain skin integrity and prevent breakdown  assess q shift and prn     Team Discussion:  Discharge Planning/Teaching Needs:  Goal to discharge to SNF ([facility]) pending insurance authorizations  N/A   Current Update: on target  Current Barriers to Discharge:  Decreased caregiver support, Incontinence, Insurance for SNF coverage and Limited functional progress; total assist x2 for activities and max assist cues for cognitive tasks  Possible Resolutions to Barriers: SNF placement recommended for discharge   Patient on target to meet rehab goals: no, Little change in functional status within the past week. Functional gains limited by lethargy and cognitive status. Unsafe to upgrade goal for SLP; currently on D1-Nectar thick diet. Continue to note inability to scan past midline and pushes heavily during transfers. Experiencing pain in left LE/tone with movement.  *See Care Plan and progress notes for long and short-term goals.   Revisions to Treatment Plan:  Added ROM exercises to address pain/tone in left side    Medical Summary Current Status: diabetic control fair, variable level of attention/alertness, poor midline awareness Weekly Focus/Goal: tone management  Barriers to Discharge: Other (comments);Incontinence  Barriers to Discharge Comments: variable level of alertness , Possible Resolutions to Barriers: DC IV   Continued Need for Acute  Rehabilitation Level of Care: The patient requires daily medical management by a physician with specialized training in physical medicine and rehabilitation for the following reasons: Direction of a multidisciplinary physical rehabilitation program to maximize functional independence : Yes Medical management of patient stability for increased activity during participation in an intensive rehabilitation regime.: Yes Analysis of laboratory values and/or radiology reports with any subsequent need for medication adjustment and/or medical intervention. : Yes   I attest that I was present, lead the team conference, and concur with the assessment and plan of the team.   Chana Bode B 04/30/2020, 1:48 PM

## 2020-04-30 NOTE — Progress Notes (Signed)
Physical Therapy Session Note  Patient Details  [name] MRN: [id number] Date of Birth: 1946/02/25  Today's Date: 04/30/2020 PT Individual Time: 0900-0956 PT Individual Time Calculation (min): 56 min   Short Term Goals: Week 2:  PT Short Term Goal 1 (Week 2): Pt will demonstrate ability to maintain static sitting EOB with close supervision for 5 minutes PT Short Term Goal 2 (Week 2): Pt will perform supine<>sit with max assist of 1 PT Short Term Goal 3 (Week 2): pt to demonstrate transfer to/from bed<>chair at mod A x1  Skilled Therapeutic Interventions/Progress Updates: Pt presented in TIS agreeable to therapy. Pt c/o pain in LLE with pt in poor positioning in chair and LLE in flexed position. Pt transported to rehab gym and repositioned in TIS as well as adjusted ELR to possibly block RLE from falling off leg rest.  PTA performed LLE ROM to pt's tolerance. Participated in scanning and reaching activity with cards to board for scanning and with PTA assistance facilitation of forward flexion activity. Pt identified 2 out of 8 cards with activity and demonstrated initiation of forward flexion to place card on board 3/8 times. Pt was able to tolerate trying to reach for about 3 cards at a go before becoming more internally distracted and noted fatigue. Pt was unable to scan past midline with this activity this sess despite max multimodal cues. Pt transported back to room at end of session and was repositioned in TIS with pool noodle placed behind pt's L trunk for lateral support, pillow wedged between knees, belt alarm on, and nsg present.      Therapy Documentation Precautions:  Precautions Precautions: Fall, Other (comment) Precaution Comments: L hemi with hypertonia and L inattention Restrictions Weight Bearing Restrictions: No    Therapy/Group: Individual Therapy  Catia Todorov  Denajah Farias, PTA  04/30/2020, 12:42 PM

## 2020-04-30 NOTE — Progress Notes (Signed)
Patient ID: Meredith Shaw, female   DOB: 08-01-1946, 74 y.o.   MRN: [id number] Team Conference Report to Patient/Family  Team Conference discussion was reviewed with the patient and caregiver, including goals, any changes in plan of care and target discharge date.  Patient and caregiver express understanding and are in agreement.  The patient has a target discharge date of  (SNF pending).  [name] 04/30/2020, 1:49 PM

## 2020-04-30 NOTE — Progress Notes (Signed)
Occupational Therapy Discharge Summary  Patient Details  [name] MRN: [id number] Date of Birth: 1945/12/08  Patient has met 10 of 11 long term goals due to improved activity tolerance, improved balance, postural control and improved attention.  Patient to discharge at overall Total A level.  Patient's family is unable to provide the necessary assistance at discharge, pt is discharging to SNF to continue with skilled rehabilitation.    Pt still requires Total A for UB dressing and therefore this goal was unable to be met.  Recommendation:  Patient will benefit from ongoing skilled OT services in skilled nursing facility setting to continue to advance functional skills in the area of BADL.  Equipment: No equipment provided  Reasons for discharge: discharge from hospital  Patient/family agrees with progress made and goals achieved: Yes  OT Discharge Precautions/Restrictions  Precautions Precautions: Fall;Other (comment) Precaution Comments: L hemi with hypertonia and L inattention General OT Amount of Missed Time: 25 Minutes Vision Patient Visual Report: Peripheral vision impairment Vision Assessment?: Yes Ocular Range of Motion: Restricted on the left Alignment/Gaze Preference: Gaze right;Head turned Tracking/Visual Pursuits: Decreased smoothness of eye movement to LEFT superior field;Decreased smoothness of eye movement to LEFT inferior field Convergence: Impaired (comment) Visual Fields: Left homonymous hemianopsia Perception  Perception: Impaired Inattention/Neglect: Does not attend to left side of body;Does not attend to left visual field Praxis  motor planning, initiation Cognition Overall Cognitive Status: Impaired/Different from baseline Arousal/Alertness: Awake/alert Orientation Level: Oriented to person;Oriented to place Attention: Sustained Sustained Attention: Impaired Sustained Attention Impairment: Verbal basic;Functional basic Memory:  Impaired Memory Impairment: Storage deficit;Retrieval deficit;Decreased recall of new information Sensation Sensation Light Touch: Impaired by gross assessment Light Touch Impaired Details: Absent LUE;Absent LLE Hot/Cold: Not tested Proprioception: Impaired by gross assessment Stereognosis: Not tested Coordination Gross Motor Movements are Fluid and Coordinated: No Fine Motor Movements are Fluid and Coordinated: No Coordination and Movement Description: impaired due to L hemiparesis, L UE and LE hypertonia, impaired turnk control, L inattention, and impaired motor planning Motor  Motor Motor: Hemiplegia;Abnormal tone;Abnormal postural alignment and control Motor - Discharge Observations: L hemiparesis; L hypertonia with mild decrease in flexor tone in the LLE Mobility  Bed Mobility Bed Mobility: Supine to Sit;Sit to Supine;Rolling Right;Rolling Left Rolling Right: Total Assistance - Patient < 25% Rolling Left: Total Assistance - Patient < 25% Supine to Sit: Total Assistance - Patient < 25% Sit to Supine: Total Assistance - Patient < 25% Transfers Sit to Stand: 2 Helpers Stand to Sit: 2 Helpers  Trunk/Postural Assessment  Cervical Assessment Cervical Assessment: Exceptions to [facility] (R gaze preference) Thoracic Assessment Thoracic Assessment: Exceptions to [facility] (kyphotic with rounded shoulders) Lumbar Assessment Lumbar Assessment: Exceptions to [facility] (posterior pelvic tilt) Postural Control Postural Control: Deficits on evaluation Trunk Control: impaired with L posteiror lean/LOB requiring assist and R UE support to maintain upright Righting Reactions: delayed and insufficient with posterior bias Protective Responses: delayed and insufficient posteriorly and to the L Postural Limitations: decreased with impaired core muscle strength  Balance Balance Balance Assessed: Yes Static Sitting Balance Static Sitting - Level of Assistance: 2: Max assist Dynamic Sitting Balance Dynamic  Sitting - Balance Support: Feet supported Dynamic Sitting - Level of Assistance: 1: +1 Total assist Extremity/Trunk Assessment RUE Assessment RUE Assessment: Within Functional Limits LUE Assessment LUE Assessment: Exceptions to [facility] Strength Comments: significant increased tone   Darleen Crocker P 04/30/2020, 4:54 PM

## 2020-04-30 NOTE — Progress Notes (Signed)
Patient ID: Meredith Shaw, female   DOB: 08/30/1946, 74 y.o.   MRN: [id number]   Voicemail left with [facility] Admission Director , Tammy to follow up on patient auth

## 2020-05-01 ENCOUNTER — Inpatient Hospital Stay (HOSPITAL_COMMUNITY): Payer: Medicare Other

## 2020-05-01 ENCOUNTER — Inpatient Hospital Stay (HOSPITAL_COMMUNITY): Payer: Medicare Other | Admitting: Speech Pathology

## 2020-05-01 ENCOUNTER — Inpatient Hospital Stay (HOSPITAL_COMMUNITY): Payer: Medicare Other | Admitting: Occupational Therapy

## 2020-05-01 LAB — GLUCOSE, CAPILLARY
Glucose-Capillary: 93 mg/dL (ref 70–99)
Glucose-Capillary: 189 mg/dL — ABNORMAL HIGH (ref 70–99)
Glucose-Capillary: 166 mg/dL — ABNORMAL HIGH (ref 70–99)
Glucose-Capillary: 247 mg/dL — ABNORMAL HIGH (ref 70–99)

## 2020-05-01 MED ORDER — ROPINIROLE HCL 0.25 MG PO TABS
0.2500 mg | ORAL_TABLET | Freq: Three times a day (TID) | ORAL | Status: DC
  Administered 2020-05-01 – 2020-05-05 (×13): 0.25 mg via ORAL
  Filled 2020-05-01 (×15): qty 1

## 2020-05-01 NOTE — Progress Notes (Signed)
Patient ID: Meredith Shaw, female   DOB: 09-Oct-1946, 74 y.o.   MRN: [id number]  Sw received follow up call from [facility]. Patient SNF authorization denied. Will attempt to put in appeal. Will inform family of decision. Appeals Phone number: [phone]  Information provided by: Tresa Endo, RN [facility]) [phone]

## 2020-05-01 NOTE — Plan of Care (Signed)
  Problem: Consults Goal: RH STROKE PATIENT EDUCATION Description: See Patient Education module for education specifics  Outcome: Progressing Goal: Diabetes Guidelines if Diabetic/Glucose > 140 Description: If diabetic or lab glucose is > 140 mg/dl - Initiate Diabetes/Hyperglycemia Guidelines & Document Interventions  Outcome: Progressing   Problem: RH BOWEL ELIMINATION Goal: RH STG MANAGE BOWEL WITH ASSISTANCE Description: STG Manage Bowel with min assist Outcome: Progressing   Problem: RH BLADDER ELIMINATION Goal: RH STG MANAGE BLADDER WITH ASSISTANCE Description: STG Manage Bladder With min assist Outcome: Progressing   Problem: RH SKIN INTEGRITY Goal: RH STG MAINTAIN SKIN INTEGRITY WITH ASSISTANCE Description: STG Maintain Skin Integrity With mod I assist Outcome: Progressing   Problem: RH SAFETY Goal: RH STG ADHERE TO SAFETY PRECAUTIONS W/ASSISTANCE/DEVICE Description: STG Adhere to Safety Precautions With cues and reminders Outcome: Progressing   Problem: RH COGNITION-NURSING Goal: RH STG ANTICIPATES NEEDS/CALLS FOR ASSIST W/ASSIST/CUES Description: STG Anticipates Needs/Calls for Assist With cues and reminders Outcome: Progressing   Problem: RH PAIN MANAGEMENT Goal: RH STG PAIN MANAGED AT OR BELOW PT'S PAIN GOAL Description: Pain level less than 4 on scale of 0-10 Outcome: Progressing   Problem: RH KNOWLEDGE DEFICIT Goal: RH STG INCREASE KNOWLEDGE OF DIABETES Description: Pt will be able to adhere to medication regimen and dietary modification to better control blood glucose levels with min assist.  Outcome: Progressing Goal: RH STG INCREASE KNOWLEDGE OF HYPERTENSION Description: Pt will be able to adhere to medication regimen and dietary modification to better control blood pressure with min assist using handouts and booklets provided Outcome: Progressing Goal: RH STG INCREASE KNOWLEGDE OF HYPERLIPIDEMIA Description: Pt will be able to adhere to medication  regimen and dietary modification to better control blood cholesterol levels with min assist using booklets and handouts provided.  Outcome: Progressing Goal: RH STG INCREASE KNOWLEDGE OF STROKE PROPHYLAXIS Description: Pt will be able to adhere to medication regimen and dietary modification to better control blood pressure and prevent stroke with min assist using handouts and booklets provided.  Outcome: Progressing   

## 2020-05-01 NOTE — Progress Notes (Signed)
Occupational Therapy Session Note  Patient Details  [name] MRN: [id number] Date of Birth: Feb 25, 1946  Today's Date: 05/01/2020 OT Individual Time: 3154-0086 OT Individual Time Calculation (min): 56 min    Short Term Goals: Week 2:  OT Short Term Goal 1 (Week 2): Pt will complete 1/4 steps of donning shirt with multimodal cuing. OT Short Term Goal 2 (Week 2): Pt will locate 2/2 grooming items on L side of sink with min VCs. OT Short Term Goal 3 (Week 2): Pt will maintaining sitting balance for 5 minutes on EOB with mod A for self care tasks.  Skilled Therapeutic Interventions/Progress Updates:    Upon entering the room, pt supine in bed and having difficulty remaining awake during this session. Pt just reports feeling, "sleepy" and no c/o pain this session. Pt rolling L <> R with total A for therapist to check for incontinence and assist with LB clothing. Pt requesting to eat breakfast and tray set up in front of her. Pt remains very sleepy but alert to safely eat food this morning. Meal placed at midline and slightly to the L for visual scanning to locate all needed items. Pt utilizing utensils with R UE to feed self with increased time and min cuing for safety. OT placing L UE into resting hand splint for gentle stretch. NT arrived to take over supervision for meal at end of this session. OT reviewed schedule with pt. Call bell and all needed items within reach upon exiting the room.   Therapy Documentation Precautions:  Precautions Precautions: Fall, Other (comment) Precaution Comments: L hemi with hypertonia and L inattention Restrictions Weight Bearing Restrictions: No General:   Vital Signs: Therapy Vitals Pulse Rate: 85 BP: 107/70 Pain: Pain Assessment Pain Scale: 0-10 Pain Score: 0-No pain   Therapy/Group: Individual Therapy  Alen Bleacher 05/01/2020, 9:46 AM

## 2020-05-01 NOTE — Progress Notes (Signed)
Patient ID: Meredith Shaw, female   DOB: [birth year], 74 y.o.   MRN: [id number]   Sw has put in appeal with [facility] Medicare Appeals and Grievances, Decision will be made within 72 hrs. Sw has provided information to them follow up contact information of Gavin Pound, Nurse Case Production designer, theatre/television/film. Due to sw being out.  Appeal taken by:  Purvis Sheffield [phone] Authorization number: [id number] Reference Number: [id number]

## 2020-05-01 NOTE — Progress Notes (Signed)
Physical Therapy Session Note  Patient Details  [name] MRN: [id number] Date of Birth: November 15, 1945  Today's Date: 05/01/2020 PT Individual Time: 1000-1057 PT Individual Time Calculation (min): 57 min   Short Term Goals: Week 2:  PT Short Term Goal 1 (Week 2): Pt will demonstrate ability to maintain static sitting EOB with close supervision for 5 minutes PT Short Term Goal 2 (Week 2): Pt will perform supine<>sit with max assist of 1 PT Short Term Goal 3 (Week 2): pt to demonstrate transfer to/from bed<>chair at mod A x1  Skilled Therapeutic Interventions/Progress Updates:    Session focused on bed mobility, transfer OOB, positioning in w/c for improved postural control and sitting tolerance, and NMR to address reorientation to midline with head/trunk as well as LUE and BLE stretching/ROM.  Engaging in rolling in the bed with max to total assist using multimodal cues to promote leading with head and encouraging use of bedrails for support with total assist for LE management due to tone and weakness. Noted pt to have been incontinent of bowel, so total+2 for hygiene and clothing management. Total assist for donning of sling with rolling and +2 for transfer via lift equipment to tilt in space w/c.  Total assist for anterior weightshift to remove sling. Donning new shirt from w/c with total assist and cues for encouragement to utilize RUE to assist and cues for sequencing. Placement of pillows and use of pool noodle for improved postural control, positioning, and upright tolerance. Added pillow under RLE due to excessive movements to prevent skin breakdown on legrest.  In w/c used mirror for visual feedback to promote midline reorientation and PT performed LUE and BLE stretching/ROM for tone management and increasing flexibility.  Therapy Documentation Precautions:  Precautions Precautions: Fall, Other (comment) Precaution Comments: L hemi with hypertonia and L  inattention Restrictions Weight Bearing Restrictions: No  Pain: Unable to rate but reporting pain/discomfort in RLE with jerking/spasm motions. Daughter had been massaging leg and RN administered pain medication.     Therapy/Group: Individual Therapy  Karolee Stamps Darrol Poke, PT, DPT, CBIS  05/01/2020, 11:44 AM

## 2020-05-01 NOTE — Progress Notes (Signed)
Patient ID: Meredith Shaw, female   DOB: [birth year], 74 y.o.   MRN: [id number]  Sw informed daughter of authorization denial. sw began conversation of other options if insurance authorization is not reconsidered. Daughter would like to transition patient to LTC privately.   SW has provided contact information to McGrew at [employer] and he will be following up with daughter.   Thayer Ohm- [phone] ([facility])

## 2020-05-01 NOTE — Progress Notes (Signed)
[place] PHYSICAL MEDICINE & REHABILITATION PROGRESS NOTE   Subjective/Complaints:  No issues overnite, pt states RLE hur but could not say where., per PT OT, has mainly had LLE discomfort with increased tone   ROS: Patient denies CP, SOB, N/V/D Objective:   No results found. No results for input(s): WBC, HGB, HCT, PLT in the last 72 hours. No results for input(s): NA, K, CL, CO2, GLUCOSE, BUN, CREATININE, CALCIUM in the last 72 hours.  Intake/Output Summary (Last 24 hours) at 05/01/2020 0838 Last data filed at 05/01/2020 0300 Gross per 24 hour  Intake 200 ml  Output 700 ml  Net -500 ml     Physical Exam: Vital Signs Blood pressure (!) 119/94, pulse 80, temperature 98.4 F (36.9 C), resp. rate 17, height 5\' 7"  (1.702 m), weight 73.8 kg, SpO2 94 %.   General: No acute distress Mood and affect are appropriate Heart: Regular rate and rhythm no rubs murmurs or extra sounds Lungs: Clear to auscultation, breathing unlabored, no rales or wheezes Abdomen: Positive bowel sounds, soft nontender to palpation, nondistended Extremities: No clubbing, cyanosis, or edema Skin: No evidence of breakdown, no evidence of rash  Neurologic: alert and follows basic commands  Motor: Left facial weakness.  Motor: LUE/LLE: 0/5 proximal distal  Tone 1-2/4 left pecs, biceps,MAS 3  Hamstring MAS 3 on Left side   Assessment/Plan: 1. 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   Holding DC based on insurance verification  Care Tool:  Bathing    Body parts bathed by patient: Abdomen, Face, Chest, Right lower leg   Body parts bathed by helper: Right arm, Left arm, Chest, Abdomen, Front perineal area, Buttocks     Bathing assist Assist Level: Total Assistance - Patient < 25%     Upper Body Dressing/Undressing Upper body dressing   What is the patient wearing?: Pull over shirt    Upper body assist Assist Level: Dependent - Patient 0%    Lower Body Dressing/Undressing Lower body  dressing      What is the patient wearing?: Pants     Lower body assist Assist for lower body dressing: Total Assistance - Patient < 25%     Toileting Toileting Toileting Activity did not occur and hygiene only): N/A (no void or bm)  Toileting assist Assist for toileting: Dependent - Patient 0%     Transfers Chair/bed transfer  Transfers assist  Chair/bed transfer activity did not occur: Safety/medical concerns  Chair/bed transfer assist level: 2 Helpers     Locomotion Ambulation   Ambulation assist   Ambulation activity did not occur: Safety/medical concerns          Walk 10 feet activity   Assist  Walk 10 feet activity did not occur: Safety/medical concerns        Walk 50 feet activity   Assist Walk 50 feet with 2 turns activity did not occur: Safety/medical concerns         Walk 150 feet activity   Assist Walk 150 feet activity did not occur: Safety/medical concerns         Walk 10 feet on uneven surface  activity   Assist Walk 10 feet on uneven surfaces activity did not occur: Safety/medical concerns         Wheelchair     Assist Will patient use wheelchair at discharge?: Yes Type of Wheelchair: Manual    Wheelchair assist level: Dependent - Patient 0%      Wheelchair 50 feet with 2 turns  activity    Assist        Assist Level: Dependent - Patient 0%   Wheelchair 150 feet activity     Assist      Assist Level: Dependent - Patient 0%   Blood pressure (!) 119/94, pulse 80, temperature 98.4 F (36.9 C), resp. rate 17, height 5\' 7"  (1.702 m), weight 73.8 kg, SpO2 94 %.  Medical Problem List and Plan: 1.  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  Status post loop recorder  Continue CIR  PT, OT, SLP    WHO ordered  Spastic left hemiparesis, esp LUE: Baclofen was stopped d/t neuro-sedation.    -began trial of low dose tizanidine 7/16, tolerating so far If pt is here for an extended period awaiting placement may benefit from Botox to hamstring and biceps 2.  Antithrombotics: -DVT/anticoagulation: Subcutaneous heparin, reduce to q12 d/t bruising/pain  -recent dopplers clear             -antiplatelet therapy: Aspirin 81 mg daily 3. Pain Management: Tylenol as needed. Appears to be well controlled.              Monitor for headaches with increased activity. 4. Mood: Amantadine 150 mg daily for stimulation, tremors may be side effects D/Ced             -antipsychotic agents: N/A 5. Neuropsych: This patient is not capable of making decisions on her own behalf. 6. Skin/Wound Care: Routine skin checks 7. Fluids/Electrolytes/Nutrition: Routine in and outs. Protein and albumin are low on BMP 7/13. Ordered dietary consult. 8.  Hypertension.  Lopressor 25 mg twice daily, Norvasc 10 mg daily.    Vitals:   04/30/20 1938 05/01/20 0317  BP: 116/72 (!) 119/94  Pulse: 79 80  Resp: 18 17  Temp: 97.8 F (36.6 C) 98.4 F (36.9 C)  SpO2: 95% 94%   Lisinopril 2.5 nightly started on 7/10  7/17: held lisinopril given tizanidine trial, bp creeping up may need to resume, if BP remains elevated   9.  Post stroke dysphagia: Dysphagia #1 honey thick liquids.  Patient on IV fluids for hydration follow-up speech therapy             Advance diet as tolerated 10.  Hemorrhagic cystitis complicated by UTI.  Complete course of Ancef.  Follow-up outpatient urology 11.  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  Hemoglobin A1c 8.9.  Check blood sugars before meals and at bedtime CBG (last 3)  Recent Labs    04/30/20 1135 04/30/20 1641 05/01/20 0601  GLUCAP 212* 167* 93   Metformin 500 daily  7/16: remains elevated increased Levemir to 22U bid.               7/18: increase levemir to 24u  bid, am CBG controlled , 7/21 overall good control, cont current dose  12.  Hyperlipidemia.  Zetia/Pravacol 13.  Constipation. MiraLAX. Adjust bowel meds as necessary   LOS: 20 days A FACE TO FACE EVALUATION WAS PERFORMED  8/21 05/01/2020, 8:38 AM

## 2020-05-02 ENCOUNTER — Inpatient Hospital Stay (HOSPITAL_COMMUNITY): Payer: Medicare Other | Admitting: Physical Therapy

## 2020-05-02 ENCOUNTER — Inpatient Hospital Stay (HOSPITAL_COMMUNITY): Payer: Medicare Other

## 2020-05-02 ENCOUNTER — Inpatient Hospital Stay (HOSPITAL_COMMUNITY): Payer: Medicare Other | Admitting: Occupational Therapy

## 2020-05-02 DIAGNOSIS — G8114 Spastic hemiplegia affecting left nondominant side: Secondary | ICD-10-CM

## 2020-05-02 LAB — GLUCOSE, CAPILLARY
Glucose-Capillary: 121 mg/dL — ABNORMAL HIGH (ref 70–99)
Glucose-Capillary: 135 mg/dL — ABNORMAL HIGH (ref 70–99)
Glucose-Capillary: 177 mg/dL — ABNORMAL HIGH (ref 70–99)
Glucose-Capillary: 209 mg/dL — ABNORMAL HIGH (ref 70–99)

## 2020-05-02 NOTE — Progress Notes (Signed)
Occupational Therapy Session Note  Patient Details  [name] MRN: [id number] Date of Birth: [birth year]  Today's Date: 05/02/2020 OT Individual Time: 5643-3295 OT Individual Time Calculation (min): 42 min   Skilled Therapeutic Interventions/Progress Updates:    Pt greeted in the TIS, reported that she had just eaten lunch but unable to recall what she had eaten. With TIS placed in neutral position at the sink, she engaged in face washing, oral care, hand washing, and lotion application with Max A. Pt unable to weight shift forward towards sink to manage faucet levers or spit into sink with Max A of 1. Noted strong Lt push/lean while sitting upright as well. Worked on visual scanning towards the Lt by having reach for needed items positioned Lt of midline, HOH to use Lt functionally when feasible. Afterwards +2 required for [facility] transfer back to bed, Total A for pt to weight shift forward for sling placement. +2 for boosting up in bed. Prolonged stretching was then completed to Lt UE for tone mgt due to hypertonicity. Stretches modified whenever pt grimaced and pulled arm away for pain mgt. ROM very limited due to tone and pain. At end of session pt remained in bed with all needs within reach and bed alarm set.   Therapy Documentation Precautions:  Precautions Precautions: Fall, Other (comment) Precaution Comments: L hemi with hypertonia and L inattention Restrictions Weight Bearing Restrictions: No Vital Signs: Therapy Vitals Temp: 98.6 F (37 C) Pulse Rate: 75 Resp: 17 BP: 118/69 Patient Position (if appropriate): Lying Oxygen Therapy SpO2: 100 % O2 Device: Room Air Pain:    Therapy/Group: Individual Therapy  Maximiano Lott A Marty Sadlowski 05/02/2020, 3:54 PM

## 2020-05-02 NOTE — Progress Notes (Signed)
Physical Therapy Weekly Progress Note  Patient Details  [name] MRN: [id number] Date of Birth: 27-Apr-1946  Beginning of progress report period: April 21, 2020 End of progress report period: May 02, 2020  Today's Date: 05/02/2020     Patient has met 0 of 3 short term goals.  Pt has made slower than anticipated gains during this current course of therapy. Pt continues to demonstrate significant pushing, hypertonia, L inattention and R gaze preference. Pt continues to require max to totalA for bed mobility and max to total A x 2 for transfers with at times use of mechanical lift for safety. Sessions have been focusing more on scanning to L and reaching tasks to improve sitting balance with pt continuing to require mod to maxA due to pushing and posteriorlateral lean.   Patient continues to demonstrate the following deficits muscle weakness and muscle joint tightness, decreased cardiorespiratoy endurance, impaired timing and sequencing, abnormal tone, unbalanced muscle activation, motor apraxia, decreased coordination and decreased motor planning and decreased initiation, decreased attention, decreased awareness, decreased problem solving, decreased safety awareness, decreased memory and delayed processing and therefore will continue to benefit from skilled PT intervention to increase functional independence with mobility.  Patient not progressing toward long term goals.  See goal revision..  Plan of care revisions: downgraded due to lack of progress. D/c plan is to SNF. Focusing on positioning, balance, cognition, mobility, and endurance.  PT Short Term Goals Week 2:  PT Short Term Goal 1 (Week 2): Pt will demonstrate ability to maintain static sitting EOB with close supervision for 5 minutes PT Short Term Goal 1 - Progress (Week 2): Progressing toward goal PT Short Term Goal 2 (Week 2): Pt will perform supine<>sit with max assist of 1 PT Short Term Goal 2 - Progress (Week 2): Not  met PT Short Term Goal 3 (Week 2): pt to demonstrate transfer to/from bed<>chair at mod A x1 PT Short Term Goal 3 - Progress (Week 2): Not met Week 3:  PT Short Term Goal 2 (Week 3): = LTGs due to LOS  Skilled Therapeutic Interventions/Progress Updates:  Ambulation/gait training;DME/adaptive equipment instruction;Community reintegration;Neuromuscular re-education;Psychosocial support;Stair training;UE/LE Strength taining/ROM;Wheelchair propulsion/positioning;Balance/vestibular training;Discharge planning;Functional electrical stimulation;Pain management;Skin care/wound management;Therapeutic Activities;UE/LE Coordination activities;Cognitive remediation/compensation;Disease management/prevention;Functional mobility training;Patient/family education;Splinting/orthotics;Therapeutic Exercise;Visual/perceptual remediation/compensation   Therapy Documentation Precautions:  Precautions Precautions: Fall, Other (comment) Precaution Comments: L hemi with hypertonia and L inattention Restrictions Weight Bearing Restrictions: No  Therapy/Group: Individual Therapy  Rosita DeChalus   Lars Masson, PT, DPT, CBIS  05/02/2020, 12:35 PM

## 2020-05-02 NOTE — Progress Notes (Signed)
Physical Therapy Session Note  Patient Details  [name] MRN: [id number] Date of Birth: 18-Apr-1946  Today's Date: 05/02/2020 PT Individual Time: 0915-1010 and 1100-1150 PT Individual Time Calculation (min): 55 min and 50 min  Short Term Goals: Week 2:  PT Short Term Goal 1 (Week 2): Pt will demonstrate ability to maintain static sitting EOB with close supervision for 5 minutes PT Short Term Goal 1 - Progress (Week 2): Progressing toward goal PT Short Term Goal 2 (Week 2): Pt will perform supine<>sit with max assist of 1 PT Short Term Goal 2 - Progress (Week 2): Not met PT Short Term Goal 3 (Week 2): pt to demonstrate transfer to/from bed<>chair at mod A x1 PT Short Term Goal 3 - Progress (Week 2): Not met Week 3:     Skilled Therapeutic Interventions/Progress Updates: Pt presented in bed sleeping but easioly aroused and agreeable to therapy. Pt c/o pain in LLE/hamstring but unable to rate. Per MD received Botox injections this am. Pt performed rolling L/R max to total A with PTA noting pt with incontinent BM. PTA changed brief and performed peri-care total A. PTA performed ROM to LLE WLP and performed gentle hamstring stretch. Pt then performed rolling max/total A for placement of hoyer pad. Mechanical lift transfer performed to TIS. Pt transported to rehab gym and participated in reaching with red physioball to high/low mat 3 x 5 with PTA facilitating forward reach. PTA then attempted to have pt reach for magnets on checker board however with increased fatigue pt unable to sustain task and began falling asleep. Pt transported back to room and positioned in TIS with pool noodle providing lateral support, belt alarm on, and call bell within reach.   Tx2: Pt presented in TIS awake and alert. Session focused on scanning and forward reaching for sitting balance. Pt transported outside to [facility] entrance and pt participated in scanning to L to find things such as flowers, benches, and the  water fountain. Pt was able to sustain L rotation to objects to count of 8 before returning to R. Pt was transported to different parts of patio to create stimulating environment. PTA noted that pt was more spontaneous in turning head to L and maintaining midline this session. Pt transported back to rehab gym and participated in scanning and reaching for magnets on checkers board with pt demonstrating improved initiation this session. Pt was able to scan 1/4 and 2/4 magnets with modA then requiring maxA for scanning and reaching for remaining magnets. PTA facilitated reaching with anterior lean with magnets that were past midline. Pt transported back to room when she began to fall asleep and remained in TIS with belt alarm on, call bell within reach and needs met.      Therapy Documentation Precautions:  Precautions Precautions: Fall, Other (comment) Precaution Comments: L hemi with hypertonia and L inattention Restrictions Weight Bearing Restrictions: No General: PT Amount of Missed Time (min): 10 Minutes PT Missed Treatment Reason: Patient fatigue  Therapy/Group: Individual Therapy  Anjelo Pullman  Mariano Doshi, PTA  05/02/2020, 12:36 PM

## 2020-05-02 NOTE — Progress Notes (Signed)
Physical Therapy Note                                                          Missed visit - 30 min  Patient Details  [name] MRN: [id number] Date of Birth: [birth year] Today's Date: 05/02/2020    Pt w/nursing being Catheterized at 1500 time of session.  Returned 5:15 and pt still w/nursing for care.      Shearon Balo 05/02/2020, 4:26 PM

## 2020-05-02 NOTE — Procedures (Addendum)
Botox Injection for spasticity using needle EMG guidance  Dilution: 50 Units/ml Indication: Severe spasticity which interferes with ADL,mobility and/or  hygiene and is unresponsive to medication management and other conservative care Informed consent was obtained after describing risks and benefits of the procedure with the patient. This includes bleeding, bruising, infection, excessive weakness, or medication side effects. Discussed with daughter on 05/01/2020 Lot #[id number], exp 05/2022 Lot# [id number], exp 01/2022 Needle: 23g 1" needle electrode Number of units per muscle LEFT Biceps50  Hamstrings200 All injections were done after obtaining appropriate EMG activity and after negative drawback for blood. The patient tolerated the procedure well. Post procedure instructions were given. A followup appointment was made.

## 2020-05-02 NOTE — Progress Notes (Signed)
[facility] PHYSICAL MEDICINE & REHABILITATION PROGRESS NOTE   Subjective/Complaints:  No issues overnite, per PT ok to do QD therapy  Still has increased Biceps and hamstrings tone on the left    ROS: Patient denies CP, SOB, N/V/D Objective:   No results found. No results for input(s): WBC, HGB, HCT, PLT in the last 72 hours. No results for input(s): NA, K, CL, CO2, GLUCOSE, BUN, CREATININE, CALCIUM in the last 72 hours.  Intake/Output Summary (Last 24 hours) at 05/02/2020 0835 Last data filed at 05/02/2020 0630 Gross per 24 hour  Intake 1080 ml  Output 1100 ml  Net -20 ml     Physical Exam: Vital Signs Blood pressure 123/69, pulse 78, temperature 98.3 F (36.8 C), resp. rate 16, height 5\' 7"  (1.702 m), weight 73.8 kg, SpO2 94 %.   General: No acute distress Mood and affect are appropriate Heart: Regular rate and rhythm no rubs murmurs or extra sounds Lungs: Clear to auscultation, breathing unlabored, no rales or wheezes Abdomen: Positive bowel sounds, soft nontender to palpation, nondistended Extremities: No clubbing, cyanosis, or edema Skin: No evidence of breakdown, no evidence of rash  Neurologic: alert and follows basic commands  Motor: Left facial weakness.  Motor: LUE/LLE: 0/5 proximal distal  Tone 1-2/4 left pecs, biceps,MAS 3  Hamstring MAS 3 on Left side   Assessment/Plan: 1. 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   Holding DC based on insurance verification  Care Tool:  Bathing    Body parts bathed by patient: Abdomen, Face, Chest, Right lower leg   Body parts bathed by helper: Right arm, Left arm, Chest, Abdomen, Front perineal area, Buttocks     Bathing assist Assist Level: Total Assistance - Patient < 25%     Upper Body Dressing/Undressing Upper body dressing   What is the patient wearing?: Pull over shirt    Upper body assist Assist Level: Dependent - Patient 0%    Lower Body Dressing/Undressing Lower body dressing       What is the patient wearing?: Pants     Lower body assist Assist for lower body dressing: Total Assistance - Patient < 25%     Toileting Toileting Toileting Activity did not occur and hygiene only): N/A (no void or bm)  Toileting assist Assist for toileting: Dependent - Patient 0%     Transfers Chair/bed transfer  Transfers assist  Chair/bed transfer activity did not occur: Safety/medical concerns  Chair/bed transfer assist level: Dependent - mechanical lift     Locomotion Ambulation   Ambulation assist   Ambulation activity did not occur: Safety/medical concerns          Walk 10 feet activity   Assist  Walk 10 feet activity did not occur: Safety/medical concerns        Walk 50 feet activity   Assist Walk 50 feet with 2 turns activity did not occur: Safety/medical concerns         Walk 150 feet activity   Assist Walk 150 feet activity did not occur: Safety/medical concerns         Walk 10 feet on uneven surface  activity   Assist Walk 10 feet on uneven surfaces activity did not occur: Safety/medical concerns         Wheelchair     Assist Will patient use wheelchair at discharge?: Yes Type of Wheelchair: Manual    Wheelchair assist level: Dependent - Patient 0%      Wheelchair 50 feet with 2 turns  activity    Assist        Assist Level: Dependent - Patient 0%   Wheelchair 150 feet activity     Assist      Assist Level: Dependent - Patient 0%   Blood pressure 123/69, pulse 78, temperature 98.3 F (36.8 C), resp. rate 16, height 5\' 7"  (1.702 m), weight 73.8 kg, SpO2 94 %.  Medical Problem List and Plan: 1.  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  Status post loop recorder  Continue CIR  PT, OT, SLP    WHO ordered  Spastic left hemiparesis, esp LUE: Baclofen was stopped d/t neuro-sedation.    -began trial of low dose tizanidine 7/16, tolerating so far If pt is here for an extended period awaiting placement may benefit from Botox to hamstring and biceps- will inject today  2.  Antithrombotics: -DVT/anticoagulation: Subcutaneous heparin, reduce to q12 d/t bruising/pain  -recent dopplers clear             -antiplatelet therapy: Aspirin 81 mg daily 3. Pain Management: Tylenol as needed. Appears to be well controlled.              Monitor for headaches with increased activity. 4. Mood: Amantadine 150 mg daily for stimulation, tremors may be side effects D/Ced             -antipsychotic agents: N/A 5. Neuropsych: This patient is not capable of making decisions on her own behalf. 6. Skin/Wound Care: Routine skin checks 7. Fluids/Electrolytes/Nutrition: Routine in and outs. Protein and albumin are low on BMP 7/13. Ordered dietary consult. 8.  Hypertension.  Lopressor 25 mg twice daily, Norvasc 10 mg daily.    Vitals:   05/02/20 0430 05/02/20 0802  BP: (!) 134/77 123/69  Pulse: 76 78  Resp: 16   Temp: 98.3 F (36.8 C)   SpO2: 94%    Controlled 7/23 9.  Post stroke dysphagia: Dysphagia #1 honey thick liquids.  Patient on IV fluids for hydration follow-up speech therapy             Advance diet as tolerated 10.  Hemorrhagic cystitis complicated by UTI.  Complete course of Ancef.  Follow-up outpatient urology 11.  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  Hemoglobin A1c 8.9.  Check blood sugars before meals and at bedtime CBG (last 3)  Recent Labs    05/01/20 1639 05/01/20 2117 05/02/20 0644  GLUCAP 166* 247* 121*   Metformin 500 daily  7/16: remains elevated increased Levemir to 22U bid.               7/18: increase levemir to 24u bid, am CBG controlled , 7/21 overall good control, cont current dose  12.  Hyperlipidemia.  Zetia/Pravacol 13.  Constipation.  MiraLAX. Adjust bowel meds as necessary   LOS: 21 days A FACE TO FACE EVALUATION WAS PERFORMED  8/21 05/02/2020, 8:35 AM

## 2020-05-02 NOTE — Plan of Care (Signed)
  Problem: Consults Goal: RH STROKE PATIENT EDUCATION Description: See Patient Education module for education specifics  Outcome: Progressing Goal: Diabetes Guidelines if Diabetic/Glucose > 140 Description: If diabetic or lab glucose is > 140 mg/dl - Initiate Diabetes/Hyperglycemia Guidelines & Document Interventions  Outcome: Progressing   Problem: RH BOWEL ELIMINATION Goal: RH STG MANAGE BOWEL WITH ASSISTANCE Description: STG Manage Bowel with min assist Outcome: Progressing   Problem: RH BLADDER ELIMINATION Goal: RH STG MANAGE BLADDER WITH ASSISTANCE Description: STG Manage Bladder With min assist Outcome: Progressing   Problem: RH SKIN INTEGRITY Goal: RH STG MAINTAIN SKIN INTEGRITY WITH ASSISTANCE Description: STG Maintain Skin Integrity With mod I assist Outcome: Progressing   Problem: RH SAFETY Goal: RH STG ADHERE TO SAFETY PRECAUTIONS W/ASSISTANCE/DEVICE Description: STG Adhere to Safety Precautions With cues and reminders Outcome: Progressing   Problem: RH COGNITION-NURSING Goal: RH STG ANTICIPATES NEEDS/CALLS FOR ASSIST W/ASSIST/CUES Description: STG Anticipates Needs/Calls for Assist With cues and reminders Outcome: Progressing   Problem: RH PAIN MANAGEMENT Goal: RH STG PAIN MANAGED AT OR BELOW PT'S PAIN GOAL Description: Pain level less than 4 on scale of 0-10 Outcome: Progressing   Problem: RH KNOWLEDGE DEFICIT Goal: RH STG INCREASE KNOWLEDGE OF DIABETES Description: Pt will be able to adhere to medication regimen and dietary modification to better control blood glucose levels with min assist.  Outcome: Progressing Goal: RH STG INCREASE KNOWLEDGE OF HYPERTENSION Description: Pt will be able to adhere to medication regimen and dietary modification to better control blood pressure with min assist using handouts and booklets provided Outcome: Progressing Goal: RH STG INCREASE KNOWLEGDE OF HYPERLIPIDEMIA Description: Pt will be able to adhere to medication  regimen and dietary modification to better control blood cholesterol levels with min assist using booklets and handouts provided.  Outcome: Progressing Goal: RH STG INCREASE KNOWLEDGE OF STROKE PROPHYLAXIS Description: Pt will be able to adhere to medication regimen and dietary modification to better control blood pressure and prevent stroke with min assist using handouts and booklets provided.  Outcome: Progressing   

## 2020-05-02 NOTE — Plan of Care (Signed)
  Problem: Sit to Stand Goal: LTG:  Patient will perform sit to stand with assistance level (PT) Description: LTG:  Patient will perform sit to stand with assistance level (PT) Outcome: Not Applicable Flowsheets (Taken 05/02/2020 1246) LTG: PT will perform sit to stand in preparation for functional mobility with assistance level: (D/c) -- Note: Due to lack of progress   Problem: RH Balance Goal: LTG Patient will maintain dynamic sitting balance (PT) Description: LTG:  Patient will maintain dynamic sitting balance with assistance during mobility activities (PT) Flowsheets (Taken 05/02/2020 1246) LTG: Pt will maintain dynamic sitting balance during mobility activities with:: (downgraded due to lack of progress) Maximal Assistance - Patient 25 - 49%    Problem: RH Bed to Chair Transfers Goal: LTG Patient will perform bed/chair transfers w/assist (PT) Description: LTG: Patient will perform bed to chair transfers with assistance (PT). 05/02/2020 1246 by Philip Aspen, PT Flowsheets (Taken 05/02/2020 1246) LTG: Pt will perform Bed to Chair Transfers with assistance level: (downgraded due to lack of progress) 2 Helpers Note: Downgraded due to lack of progress. +2 for safety

## 2020-05-02 NOTE — Progress Notes (Signed)
Patient ID: Meredith Shaw, female   DOB: 1946-07-21, 74 y.o.   MRN: [id number]  This SW covering for assigned SW, Lavera Guise.   Per previous note, Appeal taken by Purvis Sheffield [phone]) Authorization number: [id number]/Reference Number: [id number] . Trouble with SW being transferred to correct dept.   SW spoke with Micheal/[employer] [phone]) to discuss appeal. Reports he was having issues within system, and could only see authorization#: [id number]. Sw to follow-up.   Cecile Sheerer, MSW, LCSWA Office: [phone] Cell: [phone] Fax: [phone]

## 2020-05-03 DIAGNOSIS — N1831 Chronic kidney disease, stage 3a: Secondary | ICD-10-CM

## 2020-05-03 LAB — GLUCOSE, CAPILLARY
Glucose-Capillary: 125 mg/dL — ABNORMAL HIGH (ref 70–99)
Glucose-Capillary: 157 mg/dL — ABNORMAL HIGH (ref 70–99)
Glucose-Capillary: 172 mg/dL — ABNORMAL HIGH (ref 70–99)
Glucose-Capillary: 212 mg/dL — ABNORMAL HIGH (ref 70–99)

## 2020-05-03 MED ORDER — INSULIN ASPART 100 UNIT/ML ~~LOC~~ SOLN
2.0000 [IU] | Freq: Three times a day (TID) | SUBCUTANEOUS | Status: DC
  Administered 2020-05-03 – 2020-05-04 (×2): 2 [IU] via SUBCUTANEOUS

## 2020-05-03 NOTE — Progress Notes (Signed)
Occupational Therapy Weekly Progress Note  Patient Details  [name] MRN: [id number] Date of Birth: 03/25/1946  Beginning of progress report period: 04/21/2020 End of progress report period: 05/03/2020   Patient has met 0 of 3 short term goals.    Pt continues to make slow progress during this report period. She continues to be limited functionally due to dense Lt hemiplegia with hypertonicity, cognitive deficits, Lt neglect and apraxia. Pt requires +2 assist  for self care tasks bedlevel and also for maximove transfers. She is currently awaiting placement for next venue of care.     Patient continues to demonstrate the following deficits: muscle weakness, muscle joint tightness and muscle paralysis, decreased cardiorespiratoy endurance, abnormal tone, unbalanced muscle activation, decreased coordination and decreased motor planning, decreased midline orientation, decreased attention to left and left side neglect, decreased initiation, decreased attention, decreased awareness, decreased problem solving and decreased safety awareness and decreased sitting balance, decreased standing balance, decreased postural control and hemiplegia and therefore will continue to benefit from skilled OT intervention to enhance overall performance with BADL.  Patient progressing toward long term goals..  Continue plan of care.  OT Short Term Goals Week 3:  OT Short Term Goal 1 (Week 3): STGs=LTGs due to ELOS     Therapy Documentation Precautions:  Precautions Precautions: Fall, Other (comment) Precaution Comments: L hemi with hypertonia and L inattention Restrictions Weight Bearing Restrictions: No Vital Signs: Therapy Vitals Temp: 98.5 F (36.9 C) Temp Source: Oral Pulse Rate: 67 Resp: (!) 24 BP: (!) 156/123 Patient Position (if appropriate): Lying Oxygen Therapy SpO2: 96 % O2 Device: Room Air Pain: Pain Assessment Pain Scale: 0-10 Pain Score: 5  Pain Type: Acute pain Pain  Location: Shoulder Pain Orientation: Right;Left Pain Descriptors / Indicators: Aching Pain Frequency: Intermittent Pain Onset: On-going Patients Stated Pain Goal: 2 Pain Intervention(s): Medication (See eMAR) ADL:        Therapy/Group: Individual Therapy   A  05/03/2020, 4:57 PM  

## 2020-05-03 NOTE — Progress Notes (Signed)
[place] PHYSICAL MEDICINE & REHABILITATION PROGRESS NOTE   Subjective/Complaints: Patient seen laying in bed this morning.  She states she slept well overnight.  She is more interactive and then previous encounter.  ROS: Denies CP, SOB, N/V/D  Objective:   No results found. No results for input(s): WBC, HGB, HCT, PLT in the last 72 hours. No results for input(s): NA, K, CL, CO2, GLUCOSE, BUN, CREATININE, CALCIUM in the last 72 hours.  Intake/Output Summary (Last 24 hours) at 05/03/2020 1312 Last data filed at 05/03/2020 1100 Gross per 24 hour  Intake 370 ml  Output 1100 ml  Net -730 ml     Physical Exam: Vital Signs Blood pressure (!) 132/72, pulse 81, temperature 98.1 F (36.7 C), resp. rate 18, height 5\' 7"  (1.702 m), weight 74 kg, SpO2 99 %. Constitutional: No distress . Vital signs reviewed. HENT: Normocephalic.  Atraumatic. Eyes: EOMI. No discharge. Cardiovascular: No JVD. Respiratory: Normal effort.  No stridor. GI: Non-distended. Skin: Warm and dry.  Intact. Psych: Flat. Musc: No edema in extremities.  No tenderness in extremities. Neuro: Alert Motor: Left facial weakness.  Motor: LUE: 1+/5 proximal distal LLE: 1/5 proximal distal Increase in tone noted in left upper and left lower extremity.  Assessment/Plan: 1. 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   Holding DC based on insurance verification  Care Tool:  Bathing    Body parts bathed by patient: Abdomen, Face, Chest, Right lower leg   Body parts bathed by helper: Right arm, Left arm, Chest, Abdomen, Front perineal area, Buttocks     Bathing assist Assist Level: Total Assistance - Patient < 25%     Upper Body Dressing/Undressing Upper body dressing   What is the patient wearing?: Pull over shirt    Upper body assist Assist Level: Dependent - Patient 0%    Lower Body Dressing/Undressing Lower body dressing      What is the patient wearing?: Pants     Lower body assist Assist  for lower body dressing: Total Assistance - Patient < 25%     Toileting Toileting Toileting Activity did not occur and hygiene only): N/A (no void or bm)  Toileting assist Assist for toileting: Dependent - Patient 0%     Transfers Chair/bed transfer  Transfers assist  Chair/bed transfer activity did not occur: Safety/medical concerns  Chair/bed transfer assist level: Dependent - mechanical lift     Locomotion Ambulation   Ambulation assist   Ambulation activity did not occur: Safety/medical concerns          Walk 10 feet activity   Assist  Walk 10 feet activity did not occur: Safety/medical concerns        Walk 50 feet activity   Assist Walk 50 feet with 2 turns activity did not occur: Safety/medical concerns         Walk 150 feet activity   Assist Walk 150 feet activity did not occur: Safety/medical concerns         Walk 10 feet on uneven surface  activity   Assist Walk 10 feet on uneven surfaces activity did not occur: Safety/medical concerns         Wheelchair     Assist Will patient use wheelchair at discharge?: Yes Type of Wheelchair: Manual    Wheelchair assist level: Dependent - Patient 0%      Wheelchair 50 feet with 2 turns activity    Assist        Assist Level: Dependent - Patient 0%  Wheelchair 150 feet activity     Assist      Assist Level: Dependent - Patient 0%   Blood pressure (!) 132/72, pulse 81, temperature 98.1 F (36.7 C), resp. rate 18, height 5\' 7"  (1.702 m), weight 74 kg, SpO2 99 %.  Medical Problem List and Plan: 1.  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  Status post loop recorder  Continue CIR    WHO nightly  Continue tizanidine  Injected with botulinum toxin on 7/23  2.   Antithrombotics: -DVT/anticoagulation: Subcutaneous heparin, reduce to q12 d/t bruising/pain  -recent dopplers clear             -antiplatelet therapy: Aspirin 81 mg daily 3. Pain Management: Tylenol as needed. Appears to be well controlled.              Monitor for headaches with increased activity. 4. Mood: Amantadine 150 mg daily for stimulation, tremors may be side effects D/Ced             -antipsychotic agents: N/A 5. Neuropsych: This patient is not capable of making decisions on her own behalf. 6. Skin/Wound Care: Routine skin checks 7. Fluids/Electrolytes/Nutrition: Routine in and outs. Ordered dietary consult. 8.  Hypertension.  Lopressor 25 mg twice daily, Norvasc 10 mg daily.    Vitals:   05/03/20 0339 05/03/20 0859  BP: (!) 151/71 (!) 132/72  Pulse: 77 81  Resp: 18   Temp: 98.1 F (36.7 C)   SpO2: 99%    Labile on 7/24, monitor for trend 9.  Post stroke dysphagia: Dysphagia #1 nectar thick liquids.  Patient on IV fluids for hydration follow-up speech therapy             Advance diet as tolerated 10.  Hemorrhagic cystitis complicated by UTI.  Complete course of Ancef.  Follow-up outpatient urology 11.  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  Hemoglobin A1c 8.9.  Check blood sugars before meals and at bedtime CBG (last 3)  Recent Labs    05/02/20 2102 05/03/20 0632 05/03/20 1133  GLUCAP 209* 125* 157*   Metformin 500 daily  Levemir increased to 24 twice daily on 7/18  NovoLog 2 3 times daily started on 7/24 12.  Hyperlipidemia.  Zetia/Pravacol 13.  Constipation. MiraLAX. Adjust bowel meds as necessary 14.  CKD stage III  Creatinine 1.11 on 7/13, labs ordered for Monday   LOS: 22 days A FACE TO FACE EVALUATION WAS PERFORMED  Roran Wegner Friday 05/03/2020, 1:12 PM

## 2020-05-04 ENCOUNTER — Inpatient Hospital Stay (HOSPITAL_COMMUNITY): Payer: Medicare Other

## 2020-05-04 DIAGNOSIS — N1831 Chronic kidney disease, stage 3a: Secondary | ICD-10-CM

## 2020-05-04 DIAGNOSIS — I10 Essential (primary) hypertension: Secondary | ICD-10-CM

## 2020-05-04 DIAGNOSIS — K5901 Slow transit constipation: Secondary | ICD-10-CM

## 2020-05-04 LAB — GLUCOSE, CAPILLARY
Glucose-Capillary: 130 mg/dL — ABNORMAL HIGH (ref 70–99)
Glucose-Capillary: 205 mg/dL — ABNORMAL HIGH (ref 70–99)
Glucose-Capillary: 153 mg/dL — ABNORMAL HIGH (ref 70–99)
Glucose-Capillary: 168 mg/dL — ABNORMAL HIGH (ref 70–99)

## 2020-05-04 MED ORDER — INSULIN ASPART 100 UNIT/ML ~~LOC~~ SOLN
4.0000 [IU] | Freq: Three times a day (TID) | SUBCUTANEOUS | Status: DC
  Administered 2020-05-04 – 2020-05-05 (×4): 4 [IU] via SUBCUTANEOUS

## 2020-05-04 NOTE — Progress Notes (Addendum)
[place] PHYSICAL MEDICINE & REHABILITATION PROGRESS NOTE   Subjective/Complaints: Patient seen laying in bed this morning.  She indicates she slept well overnight.  No reported issues.  ROS: Denies CP, SOB, N/V/D  Objective:   No results found. No results for input(s): WBC, HGB, HCT, PLT in the last 72 hours. No results for input(s): NA, K, CL, CO2, GLUCOSE, BUN, CREATININE, CALCIUM in the last 72 hours.  Intake/Output Summary (Last 24 hours) at 05/04/2020 0925 Last data filed at 05/04/2020 0758 Gross per 24 hour  Intake 386 ml  Output 1300 ml  Net -914 ml     Physical Exam: Vital Signs Blood pressure (!) 133/75, pulse 73, temperature 98.3 F (36.8 C), resp. rate 16, height 5\' 7"  (1.702 m), weight 74 kg, SpO2 97 %. Constitutional: No distress . Vital signs reviewed. HENT: Normocephalic.  Atraumatic. Eyes: Keeps eyes closed.  No discharge. Cardiovascular: No JVD. Respiratory: Normal effort.  No stridor. GI: Non-distended. Skin: Warm and dry.  Intact. Psych: Flat. Musc: No edema in extremities.  No tenderness in extremities. Neuro: Alert Motor: Left facial weakness.  Motor: LUE: 1+/5 proximal distal LLE: 1/5 proximal distal Significant tone noted in left upper extremity, tone in left lower extremity also.  Assessment/Plan: 1. 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   Holding DC based on insurance verification  Care Tool:  Bathing    Body parts bathed by patient: Abdomen, Face, Chest, Right lower leg   Body parts bathed by helper: Right arm, Left arm, Chest, Abdomen, Front perineal area, Buttocks     Bathing assist Assist Level: Total Assistance - Patient < 25%     Upper Body Dressing/Undressing Upper body dressing   What is the patient wearing?: Pull over shirt    Upper body assist Assist Level: Dependent - Patient 0%    Lower Body Dressing/Undressing Lower body dressing      What is the patient wearing?: Pants     Lower body assist  Assist for lower body dressing: Total Assistance - Patient < 25%     Toileting Toileting Toileting Activity did not occur and hygiene only): N/A (no void or bm)  Toileting assist Assist for toileting: Dependent - Patient 0%     Transfers Chair/bed transfer  Transfers assist  Chair/bed transfer activity did not occur: Safety/medical concerns  Chair/bed transfer assist level: Dependent - mechanical lift     Locomotion Ambulation   Ambulation assist   Ambulation activity did not occur: Safety/medical concerns          Walk 10 feet activity   Assist  Walk 10 feet activity did not occur: Safety/medical concerns        Walk 50 feet activity   Assist Walk 50 feet with 2 turns activity did not occur: Safety/medical concerns         Walk 150 feet activity   Assist Walk 150 feet activity did not occur: Safety/medical concerns         Walk 10 feet on uneven surface  activity   Assist Walk 10 feet on uneven surfaces activity did not occur: Safety/medical concerns         Wheelchair     Assist Will patient use wheelchair at discharge?: Yes Type of Wheelchair: Manual    Wheelchair assist level: Dependent - Patient 0%      Wheelchair 50 feet with 2 turns activity    Assist        Assist Level: Dependent - Patient 0%  Wheelchair 150 feet activity     Assist      Assist Level: Dependent - Patient 0%   Blood pressure (!) 133/75, pulse 73, temperature 98.3 F (36.8 C), resp. rate 16, height 5\' 7"  (1.702 m), weight 74 kg, SpO2 97 %.  Medical Problem List and Plan: 1.  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  Status post loop recorder  Continue CIR    WHO nightly  Continue tizanidine  Injected with botulinum toxin on 7/23   2.  Antithrombotics: -DVT/anticoagulation: Subcutaneous heparin, reduce to q12 d/t bruising/pain  -recent dopplers clear             -antiplatelet therapy: Aspirin 81 mg daily 3. Pain Management: Tylenol as needed. Appears to be well controlled.              Monitor for headaches with increased activity. 4. Mood: Amantadine 150 mg daily for stimulation, tremors may be side effects D/Ced             -antipsychotic agents: N/A 5. Neuropsych: This patient is not capable of making decisions on her own behalf. 6. Skin/Wound Care: Routine skin checks 7. Fluids/Electrolytes/Nutrition: Routine in and outs. Ordered dietary consult. 8.  Hypertension.  Lopressor 25 mg twice daily, Norvasc 10 mg daily.    Vitals:   05/04/20 0353 05/04/20 0832  BP: (!) 143/81 (!) 133/75  Pulse: 68 73  Resp: 16   Temp: 98.3 F (36.8 C)   SpO2: 97%    Slightly labile on 7/25, monitor for trend 9.  Post stroke dysphagia: Dysphagia #1 nectar thick liquids.  Patient on IV fluids for hydration follow-up speech therapy             Advance diet as tolerated 10.  Hemorrhagic cystitis complicated by UTI.  Complete course of Ancef.  Follow-up outpatient urology 11.  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  Hemoglobin A1c 8.9.  Check blood sugars before meals and at bedtime CBG (last 3)  Recent Labs    05/03/20 1730 05/03/20 2105 05/04/20 0629  GLUCAP 172* 212* 130*   Metformin 500 daily  Levemir increased to 24 twice daily on 7/18  NovoLog 2 3 times daily started on 7/24, increased to 4 3 times daily on 7/25 12.  Hyperlipidemia.  Zetia/Pravacol 13.  Constipation. MiraLAX. Adjust bowel meds as necessary 14.  CKD stage III  Creatinine 1.11 on 7/13, labs ordered for tomorrow 15.  Slow transit constipation  Suppository ordered on 7/25    LOS: 23 days A FACE TO FACE EVALUATION WAS PERFORMED  Merlon Alcorta 8/25 05/04/2020, 9:25 AM

## 2020-05-04 NOTE — Progress Notes (Signed)
Occupational Therapy Session Note  Patient Details  [name] MRN: [id number] Date of Birth: Aug 02, 1946  Today's Date: 05/04/2020 OT Individual Time: 1420-1500 OT Individual Time Calculation (min): 40 min    Short Term Goals: Week 2:  OT Short Term Goal 1 (Week 2): Pt will complete 1/4 steps of donning shirt with multimodal cuing. OT Short Term Goal 1 - Progress (Week 2): Not met OT Short Term Goal 2 (Week 2): Pt will locate 2/2 grooming items on L side of sink with min VCs. OT Short Term Goal 2 - Progress (Week 2): Not met OT Short Term Goal 3 (Week 2): Pt will maintaining sitting balance for 5 minutes on EOB with mod A for self care tasks. OT Short Term Goal 3 - Progress (Week 2): Not met  Skilled Therapeutic Interventions/Progress Updates:   Pt received supine with daughter present and active during session. Pt with spasming/involuntary movements in R UE/LE at rest. Pt able to follow ~50 of 1 step commands throughout session. No c/o pain at rest but grimacing with LUE manipulation at times, alleviated with rest. Pt completed rolling R and L with max A to change brief. Pt completed R sidelying to sit with heavy max +2 assist. Pt pushing L while sitting, much improved by cueing for R UE to hold onto bed rail. With RUE support on bedrail pt able to maintain sitting with close CGA. Pt completed hair care and oral care with max A. Pt requiring cueing throughout session for upright posture, midline orientation and visual scanning to the L. Max A for UB bathing and donning of new gown. Pt completed functional reaching task to the R with LUE placed in weightbearing to decrease pushing. Pt returned to supine and was left with all needs met, bed alarm set.   Therapy Documentation Precautions:  Precautions Precautions: Fall, Other (comment) Precaution Comments: L hemi with hypertonia and L inattention Restrictions Weight Bearing Restrictions: No  Therapy/Group: Individual  Therapy  Curtis Sites 05/04/2020, 7:21 AM

## 2020-05-05 ENCOUNTER — Inpatient Hospital Stay (HOSPITAL_COMMUNITY): Payer: Medicare Other | Admitting: Occupational Therapy

## 2020-05-05 ENCOUNTER — Inpatient Hospital Stay (HOSPITAL_COMMUNITY): Payer: Medicare Other

## 2020-05-05 DIAGNOSIS — N130 Hydronephrosis with ureteropelvic junction obstruction: Secondary | ICD-10-CM | POA: Diagnosis not present

## 2020-05-05 DIAGNOSIS — I63511 Cerebral infarction due to unspecified occlusion or stenosis of right middle cerebral artery: Secondary | ICD-10-CM | POA: Diagnosis not present

## 2020-05-05 DIAGNOSIS — R1312 Dysphagia, oropharyngeal phase: Secondary | ICD-10-CM | POA: Diagnosis not present

## 2020-05-05 DIAGNOSIS — E785 Hyperlipidemia, unspecified: Secondary | ICD-10-CM | POA: Diagnosis not present

## 2020-05-05 DIAGNOSIS — R488 Other symbolic dysfunctions: Secondary | ICD-10-CM | POA: Diagnosis not present

## 2020-05-05 DIAGNOSIS — R404 Transient alteration of awareness: Secondary | ICD-10-CM | POA: Diagnosis not present

## 2020-05-05 DIAGNOSIS — N3000 Acute cystitis without hematuria: Secondary | ICD-10-CM | POA: Diagnosis not present

## 2020-05-05 DIAGNOSIS — K2100 Gastro-esophageal reflux disease with esophagitis, without bleeding: Secondary | ICD-10-CM | POA: Diagnosis not present

## 2020-05-05 DIAGNOSIS — I69954 Hemiplegia and hemiparesis following unspecified cerebrovascular disease affecting left non-dominant side: Secondary | ICD-10-CM | POA: Diagnosis not present

## 2020-05-05 DIAGNOSIS — G459 Transient cerebral ischemic attack, unspecified: Secondary | ICD-10-CM | POA: Diagnosis not present

## 2020-05-05 DIAGNOSIS — N183 Chronic kidney disease, stage 3 unspecified: Secondary | ICD-10-CM | POA: Diagnosis not present

## 2020-05-05 DIAGNOSIS — Z7401 Bed confinement status: Secondary | ICD-10-CM | POA: Diagnosis not present

## 2020-05-05 DIAGNOSIS — E039 Hypothyroidism, unspecified: Secondary | ICD-10-CM | POA: Diagnosis not present

## 2020-05-05 DIAGNOSIS — R310 Gross hematuria: Secondary | ICD-10-CM | POA: Diagnosis not present

## 2020-05-05 DIAGNOSIS — D649 Anemia, unspecified: Secondary | ICD-10-CM | POA: Diagnosis not present

## 2020-05-05 DIAGNOSIS — I69354 Hemiplegia and hemiparesis following cerebral infarction affecting left non-dominant side: Secondary | ICD-10-CM | POA: Diagnosis not present

## 2020-05-05 DIAGNOSIS — R338 Other retention of urine: Secondary | ICD-10-CM | POA: Diagnosis not present

## 2020-05-05 DIAGNOSIS — E119 Type 2 diabetes mellitus without complications: Secondary | ICD-10-CM | POA: Diagnosis not present

## 2020-05-05 DIAGNOSIS — M2550 Pain in unspecified joint: Secondary | ICD-10-CM | POA: Diagnosis not present

## 2020-05-05 DIAGNOSIS — M6281 Muscle weakness (generalized): Secondary | ICD-10-CM | POA: Diagnosis not present

## 2020-05-05 DIAGNOSIS — N3020 Other chronic cystitis without hematuria: Secondary | ICD-10-CM | POA: Diagnosis not present

## 2020-05-05 DIAGNOSIS — G2581 Restless legs syndrome: Secondary | ICD-10-CM | POA: Diagnosis not present

## 2020-05-05 DIAGNOSIS — I10 Essential (primary) hypertension: Secondary | ICD-10-CM | POA: Diagnosis not present

## 2020-05-05 DIAGNOSIS — N390 Urinary tract infection, site not specified: Secondary | ICD-10-CM | POA: Diagnosis not present

## 2020-05-05 DIAGNOSIS — Z79899 Other long term (current) drug therapy: Secondary | ICD-10-CM | POA: Diagnosis not present

## 2020-05-05 DIAGNOSIS — Z743 Need for continuous supervision: Secondary | ICD-10-CM | POA: Diagnosis not present

## 2020-05-05 DIAGNOSIS — I69391 Dysphagia following cerebral infarction: Secondary | ICD-10-CM | POA: Diagnosis not present

## 2020-05-05 DIAGNOSIS — I69328 Other speech and language deficits following cerebral infarction: Secondary | ICD-10-CM | POA: Diagnosis not present

## 2020-05-05 DIAGNOSIS — Z794 Long term (current) use of insulin: Secondary | ICD-10-CM | POA: Diagnosis not present

## 2020-05-05 LAB — BASIC METABOLIC PANEL
Anion gap: 9 (ref 5–15)
BUN: 33 mg/dL — ABNORMAL HIGH (ref 8–23)
CO2: 25 mmol/L (ref 22–32)
Calcium: 9.2 mg/dL (ref 8.9–10.3)
Chloride: 109 mmol/L (ref 98–111)
Creatinine, Ser: 1.09 mg/dL — ABNORMAL HIGH (ref 0.44–1.00)
GFR calc Af Amer: 58 mL/min — ABNORMAL LOW (ref >60)
GFR calc non Af Amer: 50 mL/min — ABNORMAL LOW (ref >60)
Glucose, Bld: 113 mg/dL — ABNORMAL HIGH (ref 70–99)
Potassium: 4 mmol/L (ref 3.5–5.1)
Sodium: 143 mmol/L (ref 135–145)

## 2020-05-05 LAB — GLUCOSE, CAPILLARY
Glucose-Capillary: 113 mg/dL — ABNORMAL HIGH (ref 70–99)
Glucose-Capillary: 151 mg/dL — ABNORMAL HIGH (ref 70–99)

## 2020-05-05 MED ORDER — METFORMIN HCL 500 MG PO TABS: 500.0000 mg | ORAL_TABLET | Freq: Every day | ORAL | Status: DC

## 2020-05-05 MED ORDER — TIZANIDINE HCL 2 MG PO TABS: 1.0000 mg | ORAL_TABLET | Freq: Two times a day (BID) | ORAL | 0 refills | Status: AC

## 2020-05-05 MED ORDER — INSULIN DETEMIR 100 UNIT/ML ~~LOC~~ SOLN: 24.0000 [IU] | Freq: Two times a day (BID) | SUBCUTANEOUS | 11 refills | Status: DC

## 2020-05-05 MED ORDER — ROPINIROLE HCL 0.25 MG PO TABS: 0.2500 mg | ORAL_TABLET | Freq: Three times a day (TID) | ORAL | Status: DC

## 2020-05-05 NOTE — Progress Notes (Signed)
Patient ID: Meredith Shaw, female   DOB: Mar 13, 1946, 74 y.o.   MRN: [id number]  SW received updates from CM/RN- Dorien Chihuahua informed that pt was approved for SNF placement.   SW spoke with Chris/Admissions with [facility] [phone]) to discuss pt being approved for SNF placement.  SW to follow-up if pt able to d/c today.  SW spoke with attending Dr. Dianna Limbo who reports pt is medically ready for d/c. SW informed medical team. SW called [name] ([phone]) to inform on pt potential d/c today and will follow-up to confirm. She reports she is on her way to hospital with her dad. She or husband are willing to complete pt admission paperwork. SW scheduled [facility] ambulance pick up for 2:30pm.  SW met with pt dtr and husband in room to confirm d/c. SW informed on time of transportation pick up. She intends to wait for pt until transportation arrives versus going to SNF to complete admission paperwork.  Loralee Pacas, MSW, [place] Office: [phone] Cell: [phone] Fax: [phone]

## 2020-05-05 NOTE — Progress Notes (Signed)
Report given to SNF nurse. Transport scheduled for patient at 1430. Leane Para, LPN

## 2020-05-05 NOTE — Care Management (Signed)
Patient ID: Meredith Shaw, female   DOB: Jan 01, 1946, 74 y.o.   MRN: [id number] Follow up on Denial appeal for [facility] Appeal and Grievances noted denial was overturned for SNF placement. Authorization number [id number] Per appeal rep Nelly Rout

## 2020-05-05 NOTE — Progress Notes (Signed)
Patient was transported to SNF [facility]. Patient denied pains before discharge. All belongins have been packed and sent with patient. Discharge summary has been sent with patient. No questions or concerns to report at this time. Leane Para, LPN

## 2020-05-05 NOTE — Progress Notes (Signed)
Occupational Therapy Session Note  Patient Details  [name] MRN: [id number] Date of Birth: 09-06-1946  Today's Date: 05/05/2020 OT Individual Time: 1130-1156 OT Individual Time Calculation (min): 26 min   Short Term Goals: Week 3:  OT Short Term Goal 1 (Week 3): STGs=LTGs due to ELOS     Skilled Therapeutic Interventions/Progress Updates:    Pt greeted in bed in sidelying position. No s/s pain. Total A to roll pt back to supine, then elevated HOB so pt could engage in a few ADL tasks. Had her scan to the Lt to find wash cloths for washing/drying face, hand sanitizer for washing hands, and lotion bottle for applying lotion. HOH to involve her Lt arm and also for locating her Lt arm with the Rt hand. Gentle cervical stretch by having pt rotate head towards the Rt side so OT could brush hair. Gentle stretching performed to Lt UE for contracture mgt after, focus placed on offsetting flexor synergies due to significant hypertonicity. At end of tx pt was returned to sidelying position with props. Left her with all needs within reach, bed in lowest position, and bed alarm set.   Therapy Documentation Precautions:  Precautions Precautions: Fall, Other (comment) Precaution Comments: L hemi with hypertonia and L inattention Restrictions Weight Bearing Restrictions: No ADL:    Therapy/Group: Individual Therapy  Emma-Lee Oddo A Isael Stille 05/05/2020, 12:29 PM

## 2020-05-05 NOTE — Progress Notes (Signed)
[place] PHYSICAL MEDICINE & REHABILITATION PROGRESS NOTE   Subjective/Complaints: .  No issues overnite , reviewed labs   ROS: Denies CP, SOB, N/V/D  Objective:   No results found. No results for input(s): WBC, HGB, HCT, PLT in the last 72 hours. Recent Labs    05/05/20 0720  NA 143  K 4.0  CL 109  CO2 25  GLUCOSE 113*  BUN 33*  CREATININE 1.09*  CALCIUM 9.2    Intake/Output Summary (Last 24 hours) at 05/05/2020 0904 Last data filed at 05/04/2020 1705 Gross per 24 hour  Intake --  Output 300 ml  Net -300 ml     Physical Exam: Vital Signs Blood pressure (!) 112/64, pulse 72, temperature 99.3 F (37.4 C), temperature source Oral, resp. rate 18, height 5\' 7"  (1.702 m), weight 75 kg, SpO2 99 %.  General: No acute distress Mood and affect are appropriate Heart: Regular rate and rhythm no rubs murmurs or extra sounds Lungs: Clear to auscultation, breathing unlabored, no rales or wheezes Abdomen: Positive bowel sounds, soft nontender to palpation, nondistended Extremities: No clubbing, cyanosis, or edema Skin: No evidence of breakdown, no evidence of rash  Motor: LUE: 1+/5 proximal distal LLE: 1/5 proximal distal Significant tone noted in left upper extremity, tone in left lower extremity also.  Assessment/Plan: 1. 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   Holding DC based on insurance verification  CIR 15/7 in the meantime Care Tool:  Bathing    Body parts bathed by patient: Abdomen, Face, Chest, Right lower leg   Body parts bathed by helper: Right arm, Left arm, Chest, Abdomen, Front perineal area, Buttocks     Bathing assist Assist Level: Total Assistance - Patient < 25%     Upper Body Dressing/Undressing Upper body dressing   What is the patient wearing?: Pull over shirt    Upper body assist Assist Level: Dependent - Patient 0%    Lower Body Dressing/Undressing Lower body dressing      What is the patient wearing?: Pants      Lower body assist Assist for lower body dressing: Total Assistance - Patient < 25%     Toileting Toileting Toileting Activity did not occur and hygiene only): N/A (no void or bm)  Toileting assist Assist for toileting: Dependent - Patient 0%     Transfers Chair/bed transfer  Transfers assist  Chair/bed transfer activity did not occur: Safety/medical concerns  Chair/bed transfer assist level: Dependent - mechanical lift     Locomotion Ambulation   Ambulation assist   Ambulation activity did not occur: Safety/medical concerns          Walk 10 feet activity   Assist  Walk 10 feet activity did not occur: Safety/medical concerns        Walk 50 feet activity   Assist Walk 50 feet with 2 turns activity did not occur: Safety/medical concerns         Walk 150 feet activity   Assist Walk 150 feet activity did not occur: Safety/medical concerns         Walk 10 feet on uneven surface  activity   Assist Walk 10 feet on uneven surfaces activity did not occur: Safety/medical concerns         Wheelchair     Assist Will patient use wheelchair at discharge?: Yes Type of Wheelchair: Manual    Wheelchair assist level: Dependent - Patient 0%      Wheelchair 50 feet with 2 turns activity  Assist        Assist Level: Dependent - Patient 0%   Wheelchair 150 feet activity     Assist      Assist Level: Dependent - Patient 0%   Blood pressure (!) 112/64, pulse 72, temperature 99.3 F (37.4 C), temperature source Oral, resp. rate 18, height 5\' 7"  (1.702 m), weight 75 kg, SpO2 99 %.  Medical Problem List and Plan: 1.  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  Status post loop recorder  Continue CIR PT, OT SLP   WHO  nightly  Continue tizanidine  Injected with botulinum toxin on 7/23  2.  Antithrombotics: -DVT/anticoagulation: Subcutaneous heparin, reduce to q12 d/t bruising/pain  -recent dopplers clear             -antiplatelet therapy: Aspirin 81 mg daily 3. Pain Management: Tylenol as needed. Appears to be well controlled.              Monitor for headaches with increased activity. 4. Mood: Amantadine 150 mg daily for stimulation, tremors may be side effects D/Ced             -antipsychotic agents: N/A 5. Neuropsych: This patient is not capable of making decisions on her own behalf. 6. Skin/Wound Care: Routine skin checks 7. Fluids/Electrolytes/Nutrition: Routine in and outs. Ordered dietary consult. 8.  Hypertension.  Lopressor 25 mg twice daily, Norvasc 10 mg daily.    Vitals:   05/05/20 0517 05/05/20 0816  BP: (!) 133/74 (!) 112/64  Pulse: 70 72  Resp: 18   Temp: 99.3 F (37.4 C)   SpO2: 99%   controlled 7/26 9.  Post stroke dysphagia: Dysphagia #1 nectar thick liquids.  Patient on IV fluids for hydration follow-up speech therapy             Advance diet as tolerated 10.  Hemorrhagic cystitis complicated by UTI.  Complete course of Ancef.  Follow-up outpatient urology 11.  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  Hemoglobin A1c 8.9.  Check blood sugars before meals and at bedtime CBG (last 3)  Recent Labs    05/04/20 1621 05/04/20 2040 05/05/20 0551  GLUCAP 153* 168* 113*   Metformin 500 daily  Levemir increased to 24 twice daily on 7/18  NovoLog 2 3 times daily started on 7/24, increased to 4 3 times daily on 7/25 12.  Hyperlipidemia.  Zetia/Pravacol 13.  Constipation. MiraLAX. Adjust bowel meds as necessary 14.  CKD stage III  Creatinine 1.11 on 7/13, labs ordered for tomorrow 15.  Slow transit constipation  Suppository ordered on 7/25    LOS: 24 days A FACE TO FACE EVALUATION WAS PERFORMED  8/25 05/05/2020, 9:03 AM

## 2020-05-05 NOTE — Progress Notes (Signed)
Physical Therapy Session Note  Patient Details  Name: [name] MRN: [id number] Date of Birth: [birth year]  Today's Date: 05/05/2020 PT Individual Time: 1045-1100 PT Individual Time Calculation (min): 15 min   Short Term Goals: Week 3:  PT Short Term Goal 2 (Week 3): = LTGs due to LOS  Skilled Therapeutic Interventions/Progress Updates: Pt presented in bed agreeable to therapy. Pt c/o pain in LLE but states has been a little better. PTA performed gentle ROM of LLE f/b hamstring stretching. PTA noted slight improvement in range with ROM as compared to last time pt was seen by this therapist. Pt also participated in RLE ankle pumps, heel slides, and hip abd/add x 10. Pt requires max cues for initiation, technique, and completion of task. Pt then performed roll to L modA and PTA placed pillows under pt to position in sidelying. Pt left in bed with alarm on and needs met.       Therapy Documentation Precautions:  Precautions Precautions: Fall, Other (comment) Precaution Comments: L hemi with hypertonia and L inattention Restrictions Weight Bearing Restrictions: No    Therapy/Group: Individual Therapy  Romar Woodrick  Kavan Devan, PTA  05/05/2020, 4:01 PM

## 2020-05-05 NOTE — Plan of Care (Signed)
  Problem: Consults Goal: RH STROKE PATIENT EDUCATION Description: See Patient Education module for education specifics  Outcome: Progressing Goal: Diabetes Guidelines if Diabetic/Glucose > 140 Description: If diabetic or lab glucose is > 140 mg/dl - Initiate Diabetes/Hyperglycemia Guidelines & Document Interventions  Outcome: Progressing   Problem: RH BOWEL ELIMINATION Goal: RH STG MANAGE BOWEL WITH ASSISTANCE Description: STG Manage Bowel with min assist Outcome: Progressing   Problem: RH BLADDER ELIMINATION Goal: RH STG MANAGE BLADDER WITH ASSISTANCE Description: STG Manage Bladder With min assist Outcome: Progressing   Problem: RH SKIN INTEGRITY Goal: RH STG MAINTAIN SKIN INTEGRITY WITH ASSISTANCE Description: STG Maintain Skin Integrity With mod I assist Outcome: Progressing   Problem: RH SAFETY Goal: RH STG ADHERE TO SAFETY PRECAUTIONS W/ASSISTANCE/DEVICE Description: STG Adhere to Safety Precautions With cues and reminders Outcome: Progressing   Problem: RH COGNITION-NURSING Goal: RH STG ANTICIPATES NEEDS/CALLS FOR ASSIST W/ASSIST/CUES Description: STG Anticipates Needs/Calls for Assist With cues and reminders Outcome: Progressing   Problem: RH PAIN MANAGEMENT Goal: RH STG PAIN MANAGED AT OR BELOW PT'S PAIN GOAL Description: Pain level less than 4 on scale of 0-10 Outcome: Progressing   Problem: RH KNOWLEDGE DEFICIT Goal: RH STG INCREASE KNOWLEDGE OF DIABETES Description: Pt will be able to adhere to medication regimen and dietary modification to better control blood glucose levels with min assist.  Outcome: Progressing Goal: RH STG INCREASE KNOWLEDGE OF HYPERTENSION Description: Pt will be able to adhere to medication regimen and dietary modification to better control blood pressure with min assist using handouts and booklets provided Outcome: Progressing Goal: RH STG INCREASE KNOWLEGDE OF HYPERLIPIDEMIA Description: Pt will be able to adhere to medication  regimen and dietary modification to better control blood cholesterol levels with min assist using booklets and handouts provided.  Outcome: Progressing Goal: RH STG INCREASE KNOWLEDGE OF STROKE PROPHYLAXIS Description: Pt will be able to adhere to medication regimen and dietary modification to better control blood pressure and prevent stroke with min assist using handouts and booklets provided.  Outcome: Progressing

## 2020-05-05 NOTE — Plan of Care (Signed)
  Problem: RH Dressing Goal: LTG Patient will perform upper body dressing (OT) Description: LTG Patient will perform upper body dressing with assist, with/without cues (OT). Outcome: Not Met (add Reason) Note: Pt continues to be dependent for UBD, goal not met   Problem: RH Balance Goal: LTG: Patient will maintain dynamic sitting balance (OT) Description: LTG:  Patient will maintain dynamic sitting balance with assistance during activities of daily living (OT) Outcome: Completed/Met   Problem: Sit to Stand Goal: LTG:  Patient will perform sit to stand in prep for activites of daily living with assistance level (OT) Description: LTG:  Patient will perform sit to stand in prep for activites of daily living with assistance level (OT) Outcome: Completed/Met   Problem: RH Eating Goal: LTG Patient will perform eating w/assist, cues/equip (OT) Description: LTG: Patient will perform eating with assist, with/without cues using equipment (OT) Outcome: Completed/Met   Problem: RH Grooming Goal: LTG Patient will perform grooming w/assist,cues/equip (OT) Description: LTG: Patient will perform grooming with assist, with/without cues using equipment (OT) Outcome: Completed/Met   Problem: RH Bathing Goal: LTG Patient will bathe all body parts with assist levels (OT) Description: LTG: Patient will bathe all body parts with assist levels (OT) Outcome: Completed/Met   Problem: RH Dressing Goal: LTG Patient will perform lower body dressing w/assist (OT) Description: LTG: Patient will perform lower body dressing with assist, with/without cues in positioning using equipment (OT) Outcome: Completed/Met   Problem: RH Functional Use of Upper Extremity Goal: LTG Patient will use RT/LT upper extremity as a (OT) Description: LTG: Patient will use right/left upper extremity as a stabilizer/gross assist/diminished/nondominant/dominant level with assist, with/without cues during functional activity  (OT) Outcome: Completed/Met   Problem: RH Memory Goal: LTG Patient will demonstrate ability for day to day recall/carry over during activities of daily living with assistance level (OT) Description: LTG:  Patient will demonstrate ability for day to day recall/carry over during activities of daily living with assistance level (OT). Outcome: Completed/Met   Problem: RH Attention Goal: LTG Patient will demonstrate this level of attention during functional activites (OT) Description: LTG:  Patient will demonstrate this level of attention during functional activites  (OT) Outcome: Completed/Met   Problem: RH Awareness Goal: LTG: Patient will demonstrate awareness during functional activites type of (OT) Description: LTG: Patient will demonstrate awareness during functional activites type of (OT) Outcome: Completed/Met

## 2020-05-07 DIAGNOSIS — E039 Hypothyroidism, unspecified: Secondary | ICD-10-CM | POA: Diagnosis not present

## 2020-05-07 DIAGNOSIS — E785 Hyperlipidemia, unspecified: Secondary | ICD-10-CM | POA: Diagnosis not present

## 2020-05-07 DIAGNOSIS — N183 Chronic kidney disease, stage 3 unspecified: Secondary | ICD-10-CM | POA: Diagnosis not present

## 2020-05-07 DIAGNOSIS — I69354 Hemiplegia and hemiparesis following cerebral infarction affecting left non-dominant side: Secondary | ICD-10-CM | POA: Diagnosis not present

## 2020-05-07 DIAGNOSIS — Z794 Long term (current) use of insulin: Secondary | ICD-10-CM | POA: Diagnosis not present

## 2020-05-07 NOTE — Progress Notes (Signed)
Inpatient Rehabilitation Care Coordinator  Discharge Note  The overall goal for the admission was met for:   Discharge location: Yes. D/c to SNF- [facility]  Length of Stay: Yes. 23 days.   Discharge activity level: Yes. Max to total Care  Home/community participation: Yes. Limited  Services provided included: MD, RD, PT, OT, SLP, RN, CM, TR, Pharmacy, Neuropsych and SW  Financial Services: Private Insurance: [facility]  Follow-up services arranged: N/A  Comments (or additional information):  Patient/Family verbalized understanding of follow-up arrangements: Yes  Individual responsible for coordination of the follow-up plan:N/A  Confirmed correct DME delivered: Rana Snare 05/07/2020    Rana Snare

## 2020-05-08 ENCOUNTER — Other Ambulatory Visit (HOSPITAL_COMMUNITY): Payer: Self-pay | Admitting: Interventional Radiology

## 2020-05-08 DIAGNOSIS — E119 Type 2 diabetes mellitus without complications: Secondary | ICD-10-CM | POA: Diagnosis not present

## 2020-05-08 DIAGNOSIS — I639 Cerebral infarction, unspecified: Secondary | ICD-10-CM

## 2020-05-08 DIAGNOSIS — E039 Hypothyroidism, unspecified: Secondary | ICD-10-CM | POA: Diagnosis not present

## 2020-05-08 DIAGNOSIS — D649 Anemia, unspecified: Secondary | ICD-10-CM | POA: Diagnosis not present

## 2020-05-08 DIAGNOSIS — I10 Essential (primary) hypertension: Secondary | ICD-10-CM | POA: Diagnosis not present

## 2020-05-12 DIAGNOSIS — R310 Gross hematuria: Secondary | ICD-10-CM | POA: Diagnosis not present

## 2020-05-12 DIAGNOSIS — N3020 Other chronic cystitis without hematuria: Secondary | ICD-10-CM | POA: Diagnosis not present

## 2020-05-12 DIAGNOSIS — R338 Other retention of urine: Secondary | ICD-10-CM | POA: Diagnosis not present

## 2020-05-12 DIAGNOSIS — N130 Hydronephrosis with ureteropelvic junction obstruction: Secondary | ICD-10-CM | POA: Diagnosis not present

## 2020-05-13 DIAGNOSIS — I10 Essential (primary) hypertension: Secondary | ICD-10-CM | POA: Diagnosis not present

## 2020-05-13 DIAGNOSIS — N390 Urinary tract infection, site not specified: Secondary | ICD-10-CM | POA: Diagnosis not present

## 2020-05-14 DIAGNOSIS — I10 Essential (primary) hypertension: Secondary | ICD-10-CM | POA: Diagnosis not present

## 2020-05-14 DIAGNOSIS — I69354 Hemiplegia and hemiparesis following cerebral infarction affecting left non-dominant side: Secondary | ICD-10-CM | POA: Diagnosis not present

## 2020-05-14 DIAGNOSIS — E039 Hypothyroidism, unspecified: Secondary | ICD-10-CM | POA: Diagnosis not present

## 2020-05-14 DIAGNOSIS — E119 Type 2 diabetes mellitus without complications: Secondary | ICD-10-CM | POA: Diagnosis not present

## 2020-05-14 DIAGNOSIS — Z794 Long term (current) use of insulin: Secondary | ICD-10-CM | POA: Diagnosis not present

## 2020-05-16 DIAGNOSIS — I10 Essential (primary) hypertension: Secondary | ICD-10-CM | POA: Diagnosis not present

## 2020-05-16 DIAGNOSIS — E039 Hypothyroidism, unspecified: Secondary | ICD-10-CM | POA: Diagnosis not present

## 2020-05-19 DIAGNOSIS — N3000 Acute cystitis without hematuria: Secondary | ICD-10-CM | POA: Diagnosis not present

## 2020-05-19 DIAGNOSIS — N130 Hydronephrosis with ureteropelvic junction obstruction: Secondary | ICD-10-CM | POA: Diagnosis not present

## 2020-05-19 DIAGNOSIS — R338 Other retention of urine: Secondary | ICD-10-CM | POA: Diagnosis not present

## 2020-05-22 ENCOUNTER — Ambulatory Visit (INDEPENDENT_AMBULATORY_CARE_PROVIDER_SITE_OTHER): Payer: Medicare Other | Admitting: Emergency Medicine

## 2020-05-22 ENCOUNTER — Other Ambulatory Visit: Payer: Self-pay

## 2020-05-22 DIAGNOSIS — I63511 Cerebral infarction due to unspecified occlusion or stenosis of right middle cerebral artery: Secondary | ICD-10-CM

## 2020-05-22 LAB — CUP PACEART INCLINIC DEVICE CHECK
Date Time Interrogation Session: 20210812145913
Implantable Pulse Generator Implant Date: 20210802

## 2020-05-22 NOTE — Progress Notes (Signed)
ILR wound check in clinic. Steri strips removed. Wound well healed. Home monitor transmitting nightly. 1 previously reported episode of AF, appears SR w/ ectopy. Questions answered.

## 2020-05-26 DIAGNOSIS — E039 Hypothyroidism, unspecified: Secondary | ICD-10-CM | POA: Diagnosis not present

## 2020-05-26 DIAGNOSIS — I69954 Hemiplegia and hemiparesis following unspecified cerebrovascular disease affecting left non-dominant side: Secondary | ICD-10-CM | POA: Diagnosis not present

## 2020-05-26 DIAGNOSIS — R1312 Dysphagia, oropharyngeal phase: Secondary | ICD-10-CM | POA: Diagnosis not present

## 2020-05-26 DIAGNOSIS — G2581 Restless legs syndrome: Secondary | ICD-10-CM | POA: Diagnosis not present

## 2020-05-26 DIAGNOSIS — E119 Type 2 diabetes mellitus without complications: Secondary | ICD-10-CM | POA: Diagnosis not present

## 2020-05-26 DIAGNOSIS — K2100 Gastro-esophageal reflux disease with esophagitis, without bleeding: Secondary | ICD-10-CM | POA: Diagnosis not present

## 2020-05-26 DIAGNOSIS — I69391 Dysphagia following cerebral infarction: Secondary | ICD-10-CM | POA: Diagnosis not present

## 2020-05-26 DIAGNOSIS — R488 Other symbolic dysfunctions: Secondary | ICD-10-CM | POA: Diagnosis not present

## 2020-05-26 DIAGNOSIS — I63511 Cerebral infarction due to unspecified occlusion or stenosis of right middle cerebral artery: Secondary | ICD-10-CM | POA: Diagnosis not present

## 2020-05-26 DIAGNOSIS — I69354 Hemiplegia and hemiparesis following cerebral infarction affecting left non-dominant side: Secondary | ICD-10-CM | POA: Diagnosis not present

## 2020-05-26 DIAGNOSIS — E785 Hyperlipidemia, unspecified: Secondary | ICD-10-CM | POA: Diagnosis not present

## 2020-05-26 DIAGNOSIS — I10 Essential (primary) hypertension: Secondary | ICD-10-CM | POA: Diagnosis not present

## 2020-05-26 DIAGNOSIS — I69328 Other speech and language deficits following cerebral infarction: Secondary | ICD-10-CM | POA: Diagnosis not present

## 2020-05-27 ENCOUNTER — Ambulatory Visit: Payer: Medicare Other | Admitting: Nurse Practitioner

## 2020-05-27 DIAGNOSIS — G2581 Restless legs syndrome: Secondary | ICD-10-CM | POA: Diagnosis not present

## 2020-05-27 DIAGNOSIS — E119 Type 2 diabetes mellitus without complications: Secondary | ICD-10-CM | POA: Diagnosis not present

## 2020-05-27 DIAGNOSIS — E039 Hypothyroidism, unspecified: Secondary | ICD-10-CM | POA: Diagnosis not present

## 2020-05-27 DIAGNOSIS — I63511 Cerebral infarction due to unspecified occlusion or stenosis of right middle cerebral artery: Secondary | ICD-10-CM | POA: Diagnosis not present

## 2020-05-27 DIAGNOSIS — R488 Other symbolic dysfunctions: Secondary | ICD-10-CM | POA: Diagnosis not present

## 2020-05-27 DIAGNOSIS — I69328 Other speech and language deficits following cerebral infarction: Secondary | ICD-10-CM | POA: Diagnosis not present

## 2020-05-27 DIAGNOSIS — I69391 Dysphagia following cerebral infarction: Secondary | ICD-10-CM | POA: Diagnosis not present

## 2020-05-27 DIAGNOSIS — E785 Hyperlipidemia, unspecified: Secondary | ICD-10-CM | POA: Diagnosis not present

## 2020-05-27 DIAGNOSIS — K2100 Gastro-esophageal reflux disease with esophagitis, without bleeding: Secondary | ICD-10-CM | POA: Diagnosis not present

## 2020-05-27 DIAGNOSIS — R1312 Dysphagia, oropharyngeal phase: Secondary | ICD-10-CM | POA: Diagnosis not present

## 2020-05-27 DIAGNOSIS — I10 Essential (primary) hypertension: Secondary | ICD-10-CM | POA: Diagnosis not present

## 2020-05-27 DIAGNOSIS — I69954 Hemiplegia and hemiparesis following unspecified cerebrovascular disease affecting left non-dominant side: Secondary | ICD-10-CM | POA: Diagnosis not present

## 2020-05-27 DIAGNOSIS — I69354 Hemiplegia and hemiparesis following cerebral infarction affecting left non-dominant side: Secondary | ICD-10-CM | POA: Diagnosis not present

## 2020-05-28 DIAGNOSIS — I69954 Hemiplegia and hemiparesis following unspecified cerebrovascular disease affecting left non-dominant side: Secondary | ICD-10-CM | POA: Diagnosis not present

## 2020-05-28 DIAGNOSIS — R488 Other symbolic dysfunctions: Secondary | ICD-10-CM | POA: Diagnosis not present

## 2020-05-28 DIAGNOSIS — I63511 Cerebral infarction due to unspecified occlusion or stenosis of right middle cerebral artery: Secondary | ICD-10-CM | POA: Diagnosis not present

## 2020-05-28 DIAGNOSIS — K2100 Gastro-esophageal reflux disease with esophagitis, without bleeding: Secondary | ICD-10-CM | POA: Diagnosis not present

## 2020-05-28 DIAGNOSIS — E785 Hyperlipidemia, unspecified: Secondary | ICD-10-CM | POA: Diagnosis not present

## 2020-05-28 DIAGNOSIS — I69391 Dysphagia following cerebral infarction: Secondary | ICD-10-CM | POA: Diagnosis not present

## 2020-05-28 DIAGNOSIS — E119 Type 2 diabetes mellitus without complications: Secondary | ICD-10-CM | POA: Diagnosis not present

## 2020-05-28 DIAGNOSIS — R1312 Dysphagia, oropharyngeal phase: Secondary | ICD-10-CM | POA: Diagnosis not present

## 2020-05-28 DIAGNOSIS — I10 Essential (primary) hypertension: Secondary | ICD-10-CM | POA: Diagnosis not present

## 2020-05-28 DIAGNOSIS — G2581 Restless legs syndrome: Secondary | ICD-10-CM | POA: Diagnosis not present

## 2020-05-28 DIAGNOSIS — I69328 Other speech and language deficits following cerebral infarction: Secondary | ICD-10-CM | POA: Diagnosis not present

## 2020-05-28 DIAGNOSIS — E039 Hypothyroidism, unspecified: Secondary | ICD-10-CM | POA: Diagnosis not present

## 2020-05-28 DIAGNOSIS — I69354 Hemiplegia and hemiparesis following cerebral infarction affecting left non-dominant side: Secondary | ICD-10-CM | POA: Diagnosis not present

## 2020-05-29 DIAGNOSIS — R1312 Dysphagia, oropharyngeal phase: Secondary | ICD-10-CM | POA: Diagnosis not present

## 2020-05-29 DIAGNOSIS — I10 Essential (primary) hypertension: Secondary | ICD-10-CM | POA: Diagnosis not present

## 2020-05-29 DIAGNOSIS — E785 Hyperlipidemia, unspecified: Secondary | ICD-10-CM | POA: Diagnosis not present

## 2020-05-29 DIAGNOSIS — E039 Hypothyroidism, unspecified: Secondary | ICD-10-CM | POA: Diagnosis not present

## 2020-05-29 DIAGNOSIS — I69391 Dysphagia following cerebral infarction: Secondary | ICD-10-CM | POA: Diagnosis not present

## 2020-05-29 DIAGNOSIS — I69354 Hemiplegia and hemiparesis following cerebral infarction affecting left non-dominant side: Secondary | ICD-10-CM | POA: Diagnosis not present

## 2020-05-29 DIAGNOSIS — K2100 Gastro-esophageal reflux disease with esophagitis, without bleeding: Secondary | ICD-10-CM | POA: Diagnosis not present

## 2020-05-29 DIAGNOSIS — G2581 Restless legs syndrome: Secondary | ICD-10-CM | POA: Diagnosis not present

## 2020-05-29 DIAGNOSIS — I69328 Other speech and language deficits following cerebral infarction: Secondary | ICD-10-CM | POA: Diagnosis not present

## 2020-05-29 DIAGNOSIS — I63511 Cerebral infarction due to unspecified occlusion or stenosis of right middle cerebral artery: Secondary | ICD-10-CM | POA: Diagnosis not present

## 2020-05-29 DIAGNOSIS — R488 Other symbolic dysfunctions: Secondary | ICD-10-CM | POA: Diagnosis not present

## 2020-05-29 DIAGNOSIS — E119 Type 2 diabetes mellitus without complications: Secondary | ICD-10-CM | POA: Diagnosis not present

## 2020-05-29 DIAGNOSIS — I69954 Hemiplegia and hemiparesis following unspecified cerebrovascular disease affecting left non-dominant side: Secondary | ICD-10-CM | POA: Diagnosis not present

## 2020-05-30 DIAGNOSIS — R1312 Dysphagia, oropharyngeal phase: Secondary | ICD-10-CM | POA: Diagnosis not present

## 2020-05-30 DIAGNOSIS — I10 Essential (primary) hypertension: Secondary | ICD-10-CM | POA: Diagnosis not present

## 2020-05-30 DIAGNOSIS — I69391 Dysphagia following cerebral infarction: Secondary | ICD-10-CM | POA: Diagnosis not present

## 2020-05-30 DIAGNOSIS — K2100 Gastro-esophageal reflux disease with esophagitis, without bleeding: Secondary | ICD-10-CM | POA: Diagnosis not present

## 2020-05-30 DIAGNOSIS — I69954 Hemiplegia and hemiparesis following unspecified cerebrovascular disease affecting left non-dominant side: Secondary | ICD-10-CM | POA: Diagnosis not present

## 2020-05-30 DIAGNOSIS — G2581 Restless legs syndrome: Secondary | ICD-10-CM | POA: Diagnosis not present

## 2020-05-30 DIAGNOSIS — E119 Type 2 diabetes mellitus without complications: Secondary | ICD-10-CM | POA: Diagnosis not present

## 2020-05-30 DIAGNOSIS — I69354 Hemiplegia and hemiparesis following cerebral infarction affecting left non-dominant side: Secondary | ICD-10-CM | POA: Diagnosis not present

## 2020-05-30 DIAGNOSIS — E785 Hyperlipidemia, unspecified: Secondary | ICD-10-CM | POA: Diagnosis not present

## 2020-05-30 DIAGNOSIS — E039 Hypothyroidism, unspecified: Secondary | ICD-10-CM | POA: Diagnosis not present

## 2020-05-30 DIAGNOSIS — R488 Other symbolic dysfunctions: Secondary | ICD-10-CM | POA: Diagnosis not present

## 2020-05-30 DIAGNOSIS — I69328 Other speech and language deficits following cerebral infarction: Secondary | ICD-10-CM | POA: Diagnosis not present

## 2020-05-30 DIAGNOSIS — I63511 Cerebral infarction due to unspecified occlusion or stenosis of right middle cerebral artery: Secondary | ICD-10-CM | POA: Diagnosis not present

## 2020-06-02 DIAGNOSIS — I63511 Cerebral infarction due to unspecified occlusion or stenosis of right middle cerebral artery: Secondary | ICD-10-CM | POA: Diagnosis not present

## 2020-06-02 DIAGNOSIS — I10 Essential (primary) hypertension: Secondary | ICD-10-CM | POA: Diagnosis not present

## 2020-06-02 DIAGNOSIS — R488 Other symbolic dysfunctions: Secondary | ICD-10-CM | POA: Diagnosis not present

## 2020-06-02 DIAGNOSIS — I69391 Dysphagia following cerebral infarction: Secondary | ICD-10-CM | POA: Diagnosis not present

## 2020-06-02 DIAGNOSIS — E785 Hyperlipidemia, unspecified: Secondary | ICD-10-CM | POA: Diagnosis not present

## 2020-06-02 DIAGNOSIS — E119 Type 2 diabetes mellitus without complications: Secondary | ICD-10-CM | POA: Diagnosis not present

## 2020-06-02 DIAGNOSIS — I69954 Hemiplegia and hemiparesis following unspecified cerebrovascular disease affecting left non-dominant side: Secondary | ICD-10-CM | POA: Diagnosis not present

## 2020-06-02 DIAGNOSIS — E039 Hypothyroidism, unspecified: Secondary | ICD-10-CM | POA: Diagnosis not present

## 2020-06-02 DIAGNOSIS — I69354 Hemiplegia and hemiparesis following cerebral infarction affecting left non-dominant side: Secondary | ICD-10-CM | POA: Diagnosis not present

## 2020-06-02 DIAGNOSIS — K2100 Gastro-esophageal reflux disease with esophagitis, without bleeding: Secondary | ICD-10-CM | POA: Diagnosis not present

## 2020-06-02 DIAGNOSIS — G2581 Restless legs syndrome: Secondary | ICD-10-CM | POA: Diagnosis not present

## 2020-06-02 DIAGNOSIS — R1312 Dysphagia, oropharyngeal phase: Secondary | ICD-10-CM | POA: Diagnosis not present

## 2020-06-02 DIAGNOSIS — I69328 Other speech and language deficits following cerebral infarction: Secondary | ICD-10-CM | POA: Diagnosis not present

## 2020-06-03 DIAGNOSIS — G2581 Restless legs syndrome: Secondary | ICD-10-CM | POA: Diagnosis not present

## 2020-06-03 DIAGNOSIS — I69391 Dysphagia following cerebral infarction: Secondary | ICD-10-CM | POA: Diagnosis not present

## 2020-06-03 DIAGNOSIS — R1312 Dysphagia, oropharyngeal phase: Secondary | ICD-10-CM | POA: Diagnosis not present

## 2020-06-03 DIAGNOSIS — I69354 Hemiplegia and hemiparesis following cerebral infarction affecting left non-dominant side: Secondary | ICD-10-CM | POA: Diagnosis not present

## 2020-06-03 DIAGNOSIS — Z794 Long term (current) use of insulin: Secondary | ICD-10-CM | POA: Diagnosis not present

## 2020-06-03 DIAGNOSIS — K2100 Gastro-esophageal reflux disease with esophagitis, without bleeding: Secondary | ICD-10-CM | POA: Diagnosis not present

## 2020-06-03 DIAGNOSIS — I10 Essential (primary) hypertension: Secondary | ICD-10-CM | POA: Diagnosis not present

## 2020-06-03 DIAGNOSIS — I69328 Other speech and language deficits following cerebral infarction: Secondary | ICD-10-CM | POA: Diagnosis not present

## 2020-06-03 DIAGNOSIS — I63511 Cerebral infarction due to unspecified occlusion or stenosis of right middle cerebral artery: Secondary | ICD-10-CM | POA: Diagnosis not present

## 2020-06-03 DIAGNOSIS — E039 Hypothyroidism, unspecified: Secondary | ICD-10-CM | POA: Diagnosis not present

## 2020-06-03 DIAGNOSIS — E785 Hyperlipidemia, unspecified: Secondary | ICD-10-CM | POA: Diagnosis not present

## 2020-06-03 DIAGNOSIS — I69954 Hemiplegia and hemiparesis following unspecified cerebrovascular disease affecting left non-dominant side: Secondary | ICD-10-CM | POA: Diagnosis not present

## 2020-06-03 DIAGNOSIS — R488 Other symbolic dysfunctions: Secondary | ICD-10-CM | POA: Diagnosis not present

## 2020-06-03 DIAGNOSIS — E119 Type 2 diabetes mellitus without complications: Secondary | ICD-10-CM | POA: Diagnosis not present

## 2020-06-04 DIAGNOSIS — G2581 Restless legs syndrome: Secondary | ICD-10-CM | POA: Diagnosis not present

## 2020-06-04 DIAGNOSIS — E119 Type 2 diabetes mellitus without complications: Secondary | ICD-10-CM | POA: Diagnosis not present

## 2020-06-04 DIAGNOSIS — I69354 Hemiplegia and hemiparesis following cerebral infarction affecting left non-dominant side: Secondary | ICD-10-CM | POA: Diagnosis not present

## 2020-06-04 DIAGNOSIS — I69391 Dysphagia following cerebral infarction: Secondary | ICD-10-CM | POA: Diagnosis not present

## 2020-06-04 DIAGNOSIS — R1312 Dysphagia, oropharyngeal phase: Secondary | ICD-10-CM | POA: Diagnosis not present

## 2020-06-04 DIAGNOSIS — I69954 Hemiplegia and hemiparesis following unspecified cerebrovascular disease affecting left non-dominant side: Secondary | ICD-10-CM | POA: Diagnosis not present

## 2020-06-04 DIAGNOSIS — E785 Hyperlipidemia, unspecified: Secondary | ICD-10-CM | POA: Diagnosis not present

## 2020-06-04 DIAGNOSIS — I69328 Other speech and language deficits following cerebral infarction: Secondary | ICD-10-CM | POA: Diagnosis not present

## 2020-06-04 DIAGNOSIS — R488 Other symbolic dysfunctions: Secondary | ICD-10-CM | POA: Diagnosis not present

## 2020-06-04 DIAGNOSIS — I63511 Cerebral infarction due to unspecified occlusion or stenosis of right middle cerebral artery: Secondary | ICD-10-CM | POA: Diagnosis not present

## 2020-06-04 DIAGNOSIS — K2100 Gastro-esophageal reflux disease with esophagitis, without bleeding: Secondary | ICD-10-CM | POA: Diagnosis not present

## 2020-06-04 DIAGNOSIS — E039 Hypothyroidism, unspecified: Secondary | ICD-10-CM | POA: Diagnosis not present

## 2020-06-04 DIAGNOSIS — I10 Essential (primary) hypertension: Secondary | ICD-10-CM | POA: Diagnosis not present

## 2020-06-05 ENCOUNTER — Encounter: Payer: Self-pay | Admitting: Adult Health

## 2020-06-05 ENCOUNTER — Ambulatory Visit: Payer: Medicare Other | Admitting: Adult Health

## 2020-06-05 VITALS — BP 108/60

## 2020-06-05 DIAGNOSIS — E119 Type 2 diabetes mellitus without complications: Secondary | ICD-10-CM | POA: Diagnosis not present

## 2020-06-05 DIAGNOSIS — E1165 Type 2 diabetes mellitus with hyperglycemia: Secondary | ICD-10-CM | POA: Diagnosis not present

## 2020-06-05 DIAGNOSIS — I10 Essential (primary) hypertension: Secondary | ICD-10-CM

## 2020-06-05 DIAGNOSIS — I63511 Cerebral infarction due to unspecified occlusion or stenosis of right middle cerebral artery: Secondary | ICD-10-CM

## 2020-06-05 DIAGNOSIS — E039 Hypothyroidism, unspecified: Secondary | ICD-10-CM | POA: Diagnosis not present

## 2020-06-05 DIAGNOSIS — E785 Hyperlipidemia, unspecified: Secondary | ICD-10-CM

## 2020-06-05 DIAGNOSIS — R488 Other symbolic dysfunctions: Secondary | ICD-10-CM | POA: Diagnosis not present

## 2020-06-05 DIAGNOSIS — K2100 Gastro-esophageal reflux disease with esophagitis, without bleeding: Secondary | ICD-10-CM | POA: Diagnosis not present

## 2020-06-05 DIAGNOSIS — I69354 Hemiplegia and hemiparesis following cerebral infarction affecting left non-dominant side: Secondary | ICD-10-CM | POA: Diagnosis not present

## 2020-06-05 DIAGNOSIS — G2581 Restless legs syndrome: Secondary | ICD-10-CM | POA: Diagnosis not present

## 2020-06-05 DIAGNOSIS — I69391 Dysphagia following cerebral infarction: Secondary | ICD-10-CM | POA: Diagnosis not present

## 2020-06-05 DIAGNOSIS — R1312 Dysphagia, oropharyngeal phase: Secondary | ICD-10-CM | POA: Diagnosis not present

## 2020-06-05 DIAGNOSIS — I69328 Other speech and language deficits following cerebral infarction: Secondary | ICD-10-CM | POA: Diagnosis not present

## 2020-06-05 DIAGNOSIS — I69954 Hemiplegia and hemiparesis following unspecified cerebrovascular disease affecting left non-dominant side: Secondary | ICD-10-CM | POA: Diagnosis not present

## 2020-06-05 NOTE — Patient Instructions (Signed)
Residual deficits left hemiplegia with spasticity, left sided neglect, decreased sensation, and cognitive decline  Continue to work with physical and occupational therapy at facility  Continue with zanaflex for improvement of spacticity and tremors   Loop recorder will continue to be monitored for atrial fibrillation   Continue aspirin 81 mg daily  and pravastatin and Zetia for secondary stroke prevention  Continue to follow up with PCP/facility regarding cholesterol and blood pressure management  Maintain strict control of hypertension with blood pressure goal below 130/90 and cholesterol with LDL cholesterol (bad cholesterol) goal below 70 mg/dL.      Followup in the future with me in 3 months or call earlier if needed       Thank you for coming to see [place] at [facility]. I hope we have been able to provide you high quality care today.  You may receive a patient satisfaction survey over the next few weeks. We would appreciate your feedback and comments so that we may continue to improve ourselves and the health of our patients.

## 2020-06-05 NOTE — Progress Notes (Signed)
[facility] [address] [place]. [place] [address] [phone]       HOSPITAL FOLLOW UP NOTE  [name] Date of Birth:  [birth year] Medical Record Number:  [id number]   Reason for Referral:  hospital stroke follow up    SUBJECTIVE:   CHIEF COMPLAINT:  Chief Complaint  Patient presents with  . Hospitalization Follow-up    after stroke  . room 5    with daughter    HPI:   Meredith S Lawrenceis a 74 y.o.femalewith history of HTN, HLD, DBwho presented on 03/23/2020 with slurred speech, L sided weakness and ataxia. Stroke work-up revealed right MCA infarct s/p TPA and IR with TICI 3 revascularization of reoccluded on follow-up imaging, infarct embolic secondary to unknown source.  Loop recorder placed to assess for atrial fibrillation as possible stroke etiology.  Initially placed on DAPT but transition to aspirin 81 mg daily only due to persistent hematuria.  HTN stable and recommended long-term BP goal 130-150 given right MCA reocclusion.  Direct LDL 63.8 with TG 500 initiated pravastatin and Zetia.  Uncontrolled DM with A1c 8.9.  Other stroke risk factors include advanced age and family history of stroke.  Hospital course complicated by acute respiratory failure, UTI with leukocytosis, hematuria and AKI.  Evaluated by therapy and recommended discharge to CIR for ongoing therapy needs.  Stroke: R MCA infarct s/p tPA and IR w/ TICI3 revascularization but re-occluded on follow up imaging, infarct embolic secondary to unknown source  Code Stroke CT head No acute abnormality. ASPECTS 10.   CTA head & neckdistal R M2 occlusion w/ moderate collateral flow R MCA territory.   Cerebral angio angio w/ TICI3 revascularization R M1 occlusion   Post IR CT no ICH or mass effect   CT head 6/14 0234 no acute abnormality. Small vessel disease.   CT head 6/14 1232 R MCA territory infarct sparing basal ganglia  CT head 6/14 2159 progressive R MCA  territory infarct w/ basal ganglia sparing. Hyperdense R M2  MRI Large R MCA territory infarct w/ petechial hemorrhage. Scattered small R PCA territory infarcts. Punctate poster L periventricular WM infarct. Old B basal ganglia and thalamic lacunes.   MRA Reocclusion distal R M1  EEG R frontal sharp waves,generalized and lateralized R brain slowing  LE DopplerNo DVT  2D Echo EF 70-75%. No obvious source of embolus. ?Echodensity L side of MV  TEE normal MV, EF 70-75%, no PFO  Loop recorder placed 7/2 to look for AF as source of stroke  TG >500->247,Direct LDL 63.8  HgbA1c8.9  Heparin 5000 units sq tid for VTE prophylaxis  aspirin 81 mg dailyprior to admission, was on ASA 325 & Plavix 75 DAPT -> now ASA 81onlydue to persistent hematuria   Therapy recommendations: CIR   Disposition: CIR  Today, 06/05/2020, Meredith Shaw is being seen for hospital follow-up accompanied by her daughter  Discharge from CIR on 05/05/2020 to [facility] due to requirement of mod-max assist and verbal and visual cues to complete functional unfamiliar tasks safely.  Residual deficits left spastic hemiplegia, left-sided inattention, dysarthria, facial droop, and cognitive impairment On zanaflex 1mg  nightly for tremors and spasm -improvement and tolerating without side effects Resolution of dysphagia currently on regular diet Continues to work with PT/OT at facility; apparently SLP signed She is nonambulatory and currently sitting in Twain chair Denies new or worsening stroke/TIA symptoms  Remains on aspirin 81 mg daily without recurrent bleeding or bruising concerns Continues on pravastatin and Zetia  without side effects.  Recent lipid panel at facility showed LDL 76. Blood pressure today 108/60 Loop recorder has not shown atrial fibrillation thus far HTN and HLD managed by facility  No concerns at this time    ROS:   Unable to adequately obtain due to cognitive  impairment  PMH:  Past Medical History:  Diagnosis Date  . Arthritis    OA AND PAIN BOTH KNEES -RIGHT KNEE HURTS WORSE THAN LEFT  . Cataract   . Diabetes mellitus    ORAL MEDICATION - NO INSULIN  . Hypertension   . Hypertensive retinopathy    OU  . Hypothyroidism   . Kidney stones    NONE AT PRESENT TIME THAT PT IS AWARE OF  . Osteoporosis     PSH:  Past Surgical History:  Procedure Laterality Date  . ABDOMINAL HYSTERECTOMY    . BUBBLE STUDY  03/26/2020   Procedure: BUBBLE STUDY;  Surgeon: Lars Masson, MD;  Location: [facility] ENDOSCOPY;  Service: Cardiovascular;;  . CATARACT EXTRACTION    . CHOLECYSTECTOMY  1972  . COLONOSCOPY    . EYE SURGERY     RIGHT CATARACT EXTRACTON WITH LENS IMPLANT  . GROWTH REMOVED FROM THYROID    . INTRAMEDULLARY (IM) NAIL INTERTROCHANTERIC Left 02/10/2018   Procedure: INTRAMEDULLARY (IM) NAIL INTERTROCHANTRIC;  Surgeon: Kathryne Hitch, MD;  Location: [facility] OR;  Service: Orthopedics;  Laterality: Left;  . INTRAMEDULLARY (IM) NAIL INTERTROCHANTERIC Right 08/20/2018   Procedure: ORIF RIGHT HIP INTERTROC FRACTURE;  Surgeon: Kathryne Hitch, MD;  Location: [facility] OR;  Service: Orthopedics;  Laterality: Right;  . IR CT HEAD LTD  03/24/2020  . IR PERCUTANEOUS ART THROMBECTOMY/INFUSION INTRACRANIAL INC DIAG ANGIO  03/24/2020  . left hip surgery   02/2018  . LOOP RECORDER INSERTION N/A 04/11/2020   Procedure: LOOP RECORDER INSERTION;  Surgeon: Regan Lemming, MD;  Location: [facility] INVASIVE CV LAB;  Service: Cardiovascular;  Laterality: N/A;  . ORIF HIP FRACTURE Right 08/20/2018  . PERCUTANEOUS NEPHROLITHOTOMY    . RADIOLOGY WITH ANESTHESIA N/A 03/23/2020   Procedure: IR WITH ANESTHESIA;  Surgeon: Julieanne Cotton, MD;  Location: [facility] OR;  Service: Radiology;  Laterality: N/A;  . TEE WITHOUT CARDIOVERSION N/A 03/26/2020   Procedure: TRANSESOPHAGEAL ECHOCARDIOGRAM (TEE);  Surgeon: Lars Masson, MD;  Location: [facility] ENDOSCOPY;  Service:  Cardiovascular;  Laterality: N/A;  . TOTAL KNEE ARTHROPLASTY  06/09/2012   Procedure: TOTAL KNEE ARTHROPLASTY;  Surgeon: Kathryne Hitch, MD;  Location: [facility] ORS;  Service: Orthopedics;  Laterality: Right;  Right Total Knee Arthroplasty  . TOTAL KNEE ARTHROPLASTY Left 04/09/2014   Procedure: LEFT TOTAL KNEE ARTHROPLASTY;  Surgeon: Kathryne Hitch, MD;  Location: [facility] OR;  Service: Orthopedics;  Laterality: Left;  . TUBAL LIGATION      Social History:  Social History   Socioeconomic History  . Marital status: Married    Spouse name: Earvin Hansen   . Number of children: 2  . Years of education: 83  . Highest education level: Some college, no degree  Occupational History  . Occupation: retired     Associate Professor: [employer]    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    Comment: bank - 39yrs  Tobacco Use  . Smoking status: Never Smoker  . Smokeless tobacco: Never Used  Vaping Use  . Vaping Use: Never used  Substance and Sexual Activity  . Alcohol use: No  . Drug use: No  . Sexual activity: Yes    Birth control/protection: Surgical  Other Topics Concern  .  Not on file  Social History Narrative  . Not on file   Social Determinants of Health   Financial Resource Strain: Low Risk   . Difficulty of Paying Living Expenses: Not hard at all  Food Insecurity: No Food Insecurity  . Worried About Programme researcher, broadcasting/film/video in the Last Year: Never true  . Ran Out of Food in the Last Year: Never true  Transportation Needs: No Transportation Needs  . Lack of Transportation (Medical): No  . Lack of Transportation (Non-Medical): No  Physical Activity: Sufficiently Active  . Days of Exercise per Week: 7 days  . Minutes of Exercise per Session: 30 min  Stress: No Stress Concern Present  . Feeling of Stress : Not at all  Social Connections: Socially Integrated  . Frequency of Communication with Friends and Family: More than three times a week  . Frequency of Social Gatherings with Friends and Family: More than  three times a week  . Attends Religious Services: More than 4 times per year  . Active Member of Clubs or Organizations: Yes  . Attends Banker Meetings: More than 4 times per year  . Marital Status: Married  Catering manager Violence: Not At Risk  . Fear of Current or Ex-Partner: No  . Emotionally Abused: No  . Physically Abused: No  . Sexually Abused: No    Family History:  Family History  Problem Relation Age of Onset  . Heart disease Mother   . Breast cancer Mother        29's  . Stroke Father   . Heart disease Father   . Healthy Daughter   . Healthy Son   . Liver disease Maternal Grandmother        gall stones imbedded in liver - blead to death when in surgery   . Pneumonia Maternal Grandfather   . Diabetes Paternal Grandmother     Medications:   Current Outpatient Medications on File Prior to Visit  Medication Sig Dispense Refill  . acetaminophen (TYLENOL) 325 MG tablet Take 2 tablets (650 mg total) by mouth every 4 (four) hours as needed for mild pain (or temp > 37.5 C (99.5 F)).    Marland Kitchen amLODipine (NORVASC) 10 MG tablet Take 1 tablet (10 mg total) by mouth daily.    Marland Kitchen aspirin EC 81 MG tablet Take 81 mg by mouth daily.    Marland Kitchen ezetimibe (ZETIA) 10 MG tablet Take 1 tablet (10 mg total) by mouth daily.    . insulin detemir (LEVEMIR) 100 UNIT/ML injection Inject 0.24 mLs (24 Units total) into the skin 2 (two) times daily. 10 mL 11  . levothyroxine (SYNTHROID) 125 MCG tablet Take 1 tablet (125 mcg total) by mouth daily. 90 tablet 3  . metFORMIN (GLUCOPHAGE) 500 MG tablet Take 1 tablet (500 mg total) by mouth daily with breakfast.    . metoprolol tartrate (LOPRESSOR) 25 MG tablet Take 1 tablet (25 mg total) by mouth 2 (two) times daily.    . Multiple Vitamin (MULTIVITAMIN WITH MINERALS) TABS tablet Take 1 tablet by mouth daily.    . pantoprazole (PROTONIX) 40 MG tablet Take 1 tablet (40 mg total) by mouth daily.    . pravastatin (PRAVACHOL) 40 MG tablet Take 1 tablet  (40 mg total) by mouth daily at 6 PM. 30 tablet   . rOPINIRole (REQUIP) 0.25 MG tablet Take 1 tablet (0.25 mg total) by mouth 3 (three) times daily.    Marland Kitchen tiZANidine (ZANAFLEX) 2 MG tablet Take 0.5  tablets (1 mg total) by mouth 2 (two) times daily. 30 tablet 0   No current facility-administered medications on file prior to visit.    Allergies:   Allergies  Allergen Reactions  . Semaglutide Nausea And Vomiting    PO Rybelsus  . Farxiga [Dapagliflozin]     Yeast infections  . Penicillins Itching and Rash    Has tolerated Cefazlin and Ceftriaxone in past       OBJECTIVE:  Physical Exam  Vitals:   06/05/20 1724  BP: 108/60   There is no height or weight on file to calculate BMI. No exam data present  General: well developed, well nourished,  very pleasant elderly Caucasian female, seated, in no evident distress Head: head normocephalic and atraumatic.   Neck: supple with no carotid or supraclavicular bruits Cardiovascular: regular rate and rhythm, no murmurs Musculoskeletal: no deformity Skin:  no rash/petichiae Vascular:  Normal pulses all extremities   Neurologic Exam Mental Status: Awake and fully alert.   Mild dysarthria.  Disoriented to place and time. Recent and remote memory impaired. Attention span, concentration and fund of knowledge impaired.  Able to follow simple step commands.  Mood and affect appropriate.  Cranial Nerves: Fundoscopic exam reveals sharp disc margins. Pupils equal, briskly reactive to light. Extraocular movements full without nystagmus. Visual fields limited blink to threat on left side.  Left-sided inattention.  Hearing intact. Facial sensation intact.  Left facial weakness.  Tongue and palate moves normally and symmetrically.  Motor:  LUE: 1/5 with increased tone LLE: 1/5 - slight movement of toes RUE: 4/5 RLE: 4/5 Sensory.: per patient, reports equal sensation to light touch; flicker to painful stimuli on left side Coordination: Rapid  alternating movements normal on right side. Finger-to-nose and heel-to-shin performed accurately on right side. Gait and Station: deferred as patient non ambulatory  Reflexes: 1+ and symmetric. Toes downgoing.     NIHSS 15 1a.  Level of consciousness 0 1b. LOC questions 2 1c. LOC commands 0 2.  Best gaze 0 3.  Visual 1 4.  Facial palsy 1 5a.  Motor arm-left 4 5b.  Motor arm-right 0 6a.  Motor leg-left 4 6b.  Motor leg-right 0 7.  Limb ataxia 0 8.  Sensory 0 9.  Best language 0 10.  Dysarthria 1 11.  Extinction and inattention 2  Modified Rankin  4      ASSESSMENT: Meredith Shaw is a 74 y.o. year old female presented with slurred speech, left-sided weakness and ataxia on 03/23/2020 with stroke work-up revealing right MCA infarct s/p TPA and IR with TICI 3 revascularization for right M2 occlusion of follow-up imaging showed reocclusion, infarct embolic secondary to unknown source therefore loop recorder placed. Vascular risk factors include HTN, HLD, DM and advanced age.  Discharged to CIR and eventually discharged to SNF for ongoing therapy needs     PLAN:  1. Right MCA, cryptogenic:  a. Residual deficit: Left spastic hemiplegia, left-sided inattention, left facial weakness, dysarthria and cognitive impairment i. Continue to work with therapy at facility.  Consider restart SLP for cognitive therapy and residual dysarthria b. Loop recorder has not shown atrial fibrillation thus far.  Personally reviewed monthly reports c. Continue aspirin 81 mg daily  and pravastatin and Zetia for secondary stroke prevention.  d. Close PCP follow up for aggressive stroke risk factor management  2. HTN:  a. BP goal <130/90.  b. Managed by facility 3. DM: a. A1c goal<7.0 b. Managed by facility 4. HLD:  a. LDL  goal <70. Recent LDL 74.   b. Continue pravastatin and Zetia.   c. Managed and prescribed by facility     Follow up in 3 months or call earlier if needed   I spent 45  minutes of face-to-face and non-face-to-face time with patient and daughter.  This included previsit chart review, lab review, study review, order entry, electronic health record documentation, patient education regarding recent stroke, residual deficits, importance of managing stroke risk factors and answered all questions to patient satisfaction   Ihor Austin, [facility]  [facility] [address] [place], [place] [address]  Phone [phone] Fax [phone] Note: This document was prepared with digital dictation and possible smart phrase technology. Any transcriptional errors that result from this process are unintentional.

## 2020-06-06 ENCOUNTER — Ambulatory Visit (HOSPITAL_COMMUNITY)
Admission: RE | Admit: 2020-06-06 | Discharge: 2020-06-06 | Disposition: A | Discharge status: Home / Self Care | Payer: Medicare Other | Source: Ambulatory Visit | Attending: Interventional Radiology | Admitting: Interventional Radiology

## 2020-06-06 ENCOUNTER — Other Ambulatory Visit: Payer: Self-pay

## 2020-06-06 DIAGNOSIS — I639 Cerebral infarction, unspecified: Secondary | ICD-10-CM

## 2020-06-06 DIAGNOSIS — E785 Hyperlipidemia, unspecified: Secondary | ICD-10-CM | POA: Diagnosis not present

## 2020-06-06 DIAGNOSIS — R1312 Dysphagia, oropharyngeal phase: Secondary | ICD-10-CM | POA: Diagnosis not present

## 2020-06-06 DIAGNOSIS — I63511 Cerebral infarction due to unspecified occlusion or stenosis of right middle cerebral artery: Secondary | ICD-10-CM | POA: Diagnosis not present

## 2020-06-06 DIAGNOSIS — E039 Hypothyroidism, unspecified: Secondary | ICD-10-CM | POA: Diagnosis not present

## 2020-06-06 DIAGNOSIS — I69391 Dysphagia following cerebral infarction: Secondary | ICD-10-CM | POA: Diagnosis not present

## 2020-06-06 DIAGNOSIS — I69354 Hemiplegia and hemiparesis following cerebral infarction affecting left non-dominant side: Secondary | ICD-10-CM | POA: Diagnosis not present

## 2020-06-06 DIAGNOSIS — Z9889 Other specified postprocedural states: Secondary | ICD-10-CM | POA: Diagnosis not present

## 2020-06-06 DIAGNOSIS — I69328 Other speech and language deficits following cerebral infarction: Secondary | ICD-10-CM | POA: Diagnosis not present

## 2020-06-06 DIAGNOSIS — I6601 Occlusion and stenosis of right middle cerebral artery: Secondary | ICD-10-CM | POA: Diagnosis not present

## 2020-06-06 DIAGNOSIS — E119 Type 2 diabetes mellitus without complications: Secondary | ICD-10-CM | POA: Diagnosis not present

## 2020-06-06 DIAGNOSIS — I69954 Hemiplegia and hemiparesis following unspecified cerebrovascular disease affecting left non-dominant side: Secondary | ICD-10-CM | POA: Diagnosis not present

## 2020-06-06 DIAGNOSIS — G2581 Restless legs syndrome: Secondary | ICD-10-CM | POA: Diagnosis not present

## 2020-06-06 DIAGNOSIS — G8194 Hemiplegia, unspecified affecting left nondominant side: Secondary | ICD-10-CM | POA: Diagnosis not present

## 2020-06-06 DIAGNOSIS — K2100 Gastro-esophageal reflux disease with esophagitis, without bleeding: Secondary | ICD-10-CM | POA: Diagnosis not present

## 2020-06-06 DIAGNOSIS — R488 Other symbolic dysfunctions: Secondary | ICD-10-CM | POA: Diagnosis not present

## 2020-06-06 DIAGNOSIS — I10 Essential (primary) hypertension: Secondary | ICD-10-CM | POA: Diagnosis not present

## 2020-06-06 NOTE — Consult Note (Signed)
Chief Complaint: Patient was seen in consultation today for posttreatment follow-up.  Referring Physician(s): [facility]   History of Present Illness: Meredith Shaw is a 74 y.o. 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  The patient is accompanied by her daughter.  Post procedure and evaluation, the patient was discharged to CIR for rehab.  I during this time, the daughter reports patient unable to eat regular food.  She also reports improved sensation involving the left face arm and leg.  There is uncomfortable numbness with tingling with tactile stimulation in the left arm and leg.  Overall the patient still needs almost constant care because of her left-sided hemiplegia left-sided neglect..  The patient ambulates using a wheelchair.  She is able to communicate appropriately and remains mentally fully interactive.  Constitutional, the patient's weight is steady.  She eats 3 regular meals without difficulty.  Her main complaint is 1 of being constantly uncomfortable with her left-sided preference due to left-sided weakness.  She denies any recent chills fever rigors.  The patient does have a right-sided sacral pressure sore which is being treated appropriately at the facility with the aid of primary care.       Past Medical History:  Diagnosis Date  . Arthritis    OA AND PAIN BOTH KNEES -RIGHT KNEE HURTS WORSE THAN LEFT  . Cataract   . Diabetes mellitus    ORAL MEDICATION - NO INSULIN  . Hypertension   . Hypertensive retinopathy    OU  . Hypothyroidism   . Kidney stones    NONE AT PRESENT TIME THAT PT IS AWARE OF  . Osteoporosis     Past Surgical History:  Procedure Laterality Date  . ABDOMINAL HYSTERECTOMY    . BUBBLE STUDY  03/26/2020   Procedure: BUBBLE STUDY;  Surgeon: Lars Masson, MD;  Location: [facility] ENDOSCOPY;  Service:  Cardiovascular;;  . CATARACT EXTRACTION    . CHOLECYSTECTOMY  1972  . COLONOSCOPY    . EYE SURGERY     RIGHT CATARACT EXTRACTON WITH LENS IMPLANT  . GROWTH REMOVED FROM THYROID    . INTRAMEDULLARY (IM) NAIL INTERTROCHANTERIC Left 02/10/2018   Procedure: INTRAMEDULLARY (IM) NAIL INTERTROCHANTRIC;  Surgeon: Kathryne Hitch, MD;  [facility] OR;  Service: Orthopedics;  Laterality: Left;  . INTRAMEDULLARY (IM) NAIL INTERTROCHANTERIC Right 08/20/2018   Procedure: ORIF RIGHT HIP INTERTROC FRACTURE;  Surgeon: Kathryne Hitch, MD;  Location: [facility] OR;  Service: Orthopedics;  Laterality: Right;  . IR CT HEAD LTD  03/24/2020  . IR PERCUTANEOUS ART THROMBECTOMY/INFUSION INTRACRANIAL INC DIAG ANGIO  03/24/2020  . left hip surgery   02/2018  . LOOP RECORDER INSERTION N/A 04/11/2020   Procedure: LOOP RECORDER INSERTION;  Surgeon: Regan Lemming, MD;  [facility]: [facility] INVASIVE CV LAB;  Service: Cardiovascular;  Laterality: N/A;  . ORIF HIP FRACTURE Right 08/20/2018  . PERCUTANEOUS NEPHROLITHOTOMY    . RADIOLOGY WITH ANESTHESIA N/A 03/23/2020   Procedure: IR WITH ANESTHESIA;  Surgeon: Julieanne Cotton, MD;  Location: [facility] OR;  Service: Radiology;  Laterality: N/A;  . TEE WITHOUT CARDIOVERSION N/A 03/26/2020   Procedure: TRANSESOPHAGEAL ECHOCARDIOGRAM (TEE);  Surgeon: Lars Masson, MD;  Location[facility] ENDOSCOPY;  Service: Cardiovascular;  Laterality: N/A;  . TOTAL KNEE ARTHROPLASTY  06/09/2012   Procedure: TOTAL KNEE ARTHROPLASTY;  Surgeon: Kathryne Hitch, MD;  Location: [facility] ORS;  Service: Orthopedics;  Laterality: Right;  Right Total  Knee Arthroplasty  . TOTAL KNEE ARTHROPLASTY Left 04/09/2014   Procedure: LEFT TOTAL KNEE ARTHROPLASTY;  Surgeon: Kathryne Hitch, MD;  Location: [facility] OR;  Service: Orthopedics;  Laterality: Left;  . TUBAL LIGATION      Allergies: Semaglutide, Farxiga [dapagliflozin], and Penicillins  Medications: Prior to Admission medications   Medication Sig  Start Date End Date Taking? Authorizing Provider  acetaminophen (TYLENOL) 325 MG tablet Take 2 tablets (650 mg total) by mouth every 4 (four) hours as needed for mild pain (or temp > 37.5 C (99.5 F)). 04/11/20   Layne Benton, NP  amLODipine (NORVASC) 10 MG tablet Take 1 tablet (10 mg total) by mouth daily. 04/12/20   Layne Benton, NP  aspirin EC 81 MG tablet Take 81 mg by mouth daily.    [provider]  ezetimibe (ZETIA) 10 MG tablet Take 1 tablet (10 mg total) by mouth daily. 04/12/20   Layne Benton, NP  insulin detemir (LEVEMIR) 100 UNIT/ML injection Inject 0.24 mLs (24 Units total) into the skin 2 (two) times daily. 05/05/20   Angiulli, Mcarthur Rossetti, PA-C  levothyroxine (SYNTHROID) 125 MCG tablet Take 1 tablet (125 mcg total) by mouth daily. 11/19/19   Sonny Masters, FNP  metFORMIN (GLUCOPHAGE) 500 MG tablet Take 1 tablet (500 mg total) by mouth daily with breakfast. 05/06/20   Angiulli, Mcarthur Rossetti, PA-C  metoprolol tartrate (LOPRESSOR) 25 MG tablet Take 1 tablet (25 mg total) by mouth 2 (two) times daily. 04/11/20   Layne Benton, NP  Multiple Vitamin (MULTIVITAMIN WITH MINERALS) TABS tablet Take 1 tablet by mouth daily. 04/12/20   Layne Benton, NP  pantoprazole (PROTONIX) 40 MG tablet Take 1 tablet (40 mg total) by mouth daily. 04/12/20   Layne Benton, NP  pravastatin (PRAVACHOL) 40 MG tablet Take 1 tablet (40 mg total) by mouth daily at 6 PM. 04/11/20   Layne Benton, NP  rOPINIRole (REQUIP) 0.25 MG tablet Take 1 tablet (0.25 mg total) by mouth 3 (three) times daily. 05/05/20   Angiulli, Mcarthur Rossetti, PA-C  tiZANidine (ZANAFLEX) 2 MG tablet Take 0.5 tablets (1 mg total) by mouth 2 (two) times daily. 05/05/20   Angiulli, Mcarthur Rossetti, PA-C     Family History  Problem Relation Age of Onset  . Heart disease Mother   . Breast cancer Mother        75's  . Stroke Father   . Heart disease Father   . Healthy Daughter   . Healthy Son   . Liver disease Maternal Grandmother        gall stones imbedded in  liver - blead to death when in surgery   . Pneumonia Maternal Grandfather   . Diabetes Paternal Grandmother     Social History   Socioeconomic History  . Marital status: Married    Spouse name: Earvin Hansen   . Number of children: 2  . Years of education: 63  . Highest education level: Some college, no degree  Occupational History  . 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 Smoking status: Never Smoker  . Smokeless tobacco: Never Used  Vaping Use  . Vaping Use: Never used  Substance and Sexual Activity  . Alcohol use: No  . Drug use: No  . Sexual activity: Yes    Birth control/protection: Surgical  Other Topics Concern  . Not on file  Social History Narrative  . Not on file   Social Determinants of Health   Financial Resource Strain: Low Risk   . Difficulty of Paying Living Expenses: Not hard at all  Food Insecurity: No Food Insecurity  . Worried About Programme researcher, broadcasting/film/video in the Last Year: Never true  . Ran Out of Food in the Last Year: Never true  Transportation Needs: No Transportation Needs  . Lack of Transportation (Medical): No  . Lack of Transportation (Non-Medical): No  Physical Activity: Sufficiently Active  . Days of Exercise per Week: 7 days  . Minutes of Exercise per Session: 30 min  Stress: No Stress Concern Present  . Feeling of Stress : Not at all  Social Connections: Socially Integrated  . Frequency of Communication with Friends and Family: More than three times a week  . Frequency of Social Gatherings with Friends and Family: More than three times a week  . Attends Religious Services: More than 4 times per year  . Active Member of Clubs or Organizations: Yes  . Attends Banker Meetings: More than 4 times per year  . Marital Status: Married     Review of Systems: A 12 point ROS discussed and pertinent positives are indicated in the HPI above.  All other systems are  negative.  Review of Systems.  Essentially negative unless as mentioned above  Vital Signs: There were no vitals taken for this visit.  Physical Exam..  Patient awake and fully alert.  Appears in no acute distress except for complaining of being uncomfortable due to her low lying position.  She is oriented to name place her daughter, and place and year. Affect appropriate though with decreased attention span..  Decreased response to threat on the left side.  Movements full with slight preference to the right side.  Left facial droop.  Motor wise of 0 to 1/ 5 motor power in the left upper and left lower extremities with increased tone..  No evidence of sustained clonus.   Coordination.  Finger-to-nose on the right normal.   aging: CUP PACEART INCLINIC DEVICE CHECK  Result Date: 05/22/2020 ILR wound check in clinic. Steri strips removed. Wound well healed. Home monitor transmitting nightly. 1 previously reported episode of AF, appears SR w/ ectopy. Questions answered.Sharia Reeve, BSN, RN   Labs:  CBC: Recent Labs    04/09/20 0431 04/10/20 0324 04/11/20 0529 04/14/20 0739  WBC 10.8* 10.3 10.4 9.7  HGB 11.3* 10.5* 10.4* 11.5*  HCT 34.6* 32.1* 32.1* 35.2*  PLT 409* 335 348 290    COAGS: Recent Labs    03/23/20 2227 03/24/20 1527 03/26/20 0727  INR 1.0 1.1 1.1  APTT 27  --   --     BMP: Recent Labs    04/14/20 0739 04/21/20 0627 04/22/20 0532 05/05/20 0720  NA 140 142 143 143  K 3.3* 3.8 3.8 4.0  CL 106 106 105 109  CO2 23 25 28 25   GLUCOSE 189* 164* 131* 113*  BUN 15 27* 27* 33*  CALCIUM 8.9 9.2 9.1 9.2  CREATININE 1.05* 1.10* 1.11* 1.09*  GFRNONAA 53* 50* 49* 50*  GFRAA >60 58* 57* 58*    LIVER FUNCTION TESTS: Recent Labs    04/08/20 0340 04/10/20 0324 04/14/20 0739 04/22/20 0532  BILITOT 0.3 0.4 0.4 0.3  AST 44* 30 30 26   ALT 58* 34 15 19  ALKPHOS 81 78 83 65  PROT 5.6* 5.7* 6.2* 6.0*  ALBUMIN 2.5* 2.6* 2.8* 2.8*  TUMOR  MARKERS: No results for input(s): AFPTM, CEA, CA199, CHROMGRNA in the last 8760 hours.  Assessment and Plan:  Patient post revascularization of the right middle cerebral artery occlusion with a T3 revascularization approximately 4 weeks ago. Patient still needing significant assistance with daily activities.  Modest improvement in form of being able to eat normal foods 3 times a day, and tactile dysesthesias in the left upper and lower extremities.  Plan for follow-up with her neurologist in 3 months time as scheduled..  We will see as needed.  All questions answered and concerns addressed. Patient conveys understanding and agrees with plan.  Thank you for this interesting consult.  I greatly enjoyed meeting Jan FiremanMarie S Mayfield and look forward to participating in their care.  A copy of this report was sent to the requesting provider on this date.  Electronically Signed: [name] GroutEVESHWAR, Lamyiah Crawshaw K, MD 06/06/2020, 1:33 PM   I spent a total of 30 minutes in face to face in clinical consultation, greater than 50% of which was counseling/coordinating care sustained physical therapy daily importance of secondary stroke prevention measures.

## 2020-06-07 DIAGNOSIS — I10 Essential (primary) hypertension: Secondary | ICD-10-CM | POA: Diagnosis not present

## 2020-06-07 DIAGNOSIS — E785 Hyperlipidemia, unspecified: Secondary | ICD-10-CM | POA: Diagnosis not present

## 2020-06-07 DIAGNOSIS — R1312 Dysphagia, oropharyngeal phase: Secondary | ICD-10-CM | POA: Diagnosis not present

## 2020-06-07 DIAGNOSIS — E039 Hypothyroidism, unspecified: Secondary | ICD-10-CM | POA: Diagnosis not present

## 2020-06-07 DIAGNOSIS — G2581 Restless legs syndrome: Secondary | ICD-10-CM | POA: Diagnosis not present

## 2020-06-07 DIAGNOSIS — R488 Other symbolic dysfunctions: Secondary | ICD-10-CM | POA: Diagnosis not present

## 2020-06-07 DIAGNOSIS — I69354 Hemiplegia and hemiparesis following cerebral infarction affecting left non-dominant side: Secondary | ICD-10-CM | POA: Diagnosis not present

## 2020-06-07 DIAGNOSIS — K2100 Gastro-esophageal reflux disease with esophagitis, without bleeding: Secondary | ICD-10-CM | POA: Diagnosis not present

## 2020-06-07 DIAGNOSIS — I69954 Hemiplegia and hemiparesis following unspecified cerebrovascular disease affecting left non-dominant side: Secondary | ICD-10-CM | POA: Diagnosis not present

## 2020-06-07 DIAGNOSIS — E119 Type 2 diabetes mellitus without complications: Secondary | ICD-10-CM | POA: Diagnosis not present

## 2020-06-07 DIAGNOSIS — I69328 Other speech and language deficits following cerebral infarction: Secondary | ICD-10-CM | POA: Diagnosis not present

## 2020-06-07 DIAGNOSIS — I69391 Dysphagia following cerebral infarction: Secondary | ICD-10-CM | POA: Diagnosis not present

## 2020-06-07 DIAGNOSIS — I63511 Cerebral infarction due to unspecified occlusion or stenosis of right middle cerebral artery: Secondary | ICD-10-CM | POA: Diagnosis not present

## 2020-06-09 DIAGNOSIS — I69328 Other speech and language deficits following cerebral infarction: Secondary | ICD-10-CM | POA: Diagnosis not present

## 2020-06-09 DIAGNOSIS — E119 Type 2 diabetes mellitus without complications: Secondary | ICD-10-CM | POA: Diagnosis not present

## 2020-06-09 DIAGNOSIS — E785 Hyperlipidemia, unspecified: Secondary | ICD-10-CM | POA: Diagnosis not present

## 2020-06-09 DIAGNOSIS — K2100 Gastro-esophageal reflux disease with esophagitis, without bleeding: Secondary | ICD-10-CM | POA: Diagnosis not present

## 2020-06-09 DIAGNOSIS — G2581 Restless legs syndrome: Secondary | ICD-10-CM | POA: Diagnosis not present

## 2020-06-09 DIAGNOSIS — I63511 Cerebral infarction due to unspecified occlusion or stenosis of right middle cerebral artery: Secondary | ICD-10-CM | POA: Diagnosis not present

## 2020-06-09 DIAGNOSIS — I10 Essential (primary) hypertension: Secondary | ICD-10-CM | POA: Diagnosis not present

## 2020-06-09 DIAGNOSIS — I69391 Dysphagia following cerebral infarction: Secondary | ICD-10-CM | POA: Diagnosis not present

## 2020-06-09 DIAGNOSIS — R1312 Dysphagia, oropharyngeal phase: Secondary | ICD-10-CM | POA: Diagnosis not present

## 2020-06-09 DIAGNOSIS — R488 Other symbolic dysfunctions: Secondary | ICD-10-CM | POA: Diagnosis not present

## 2020-06-09 DIAGNOSIS — I69954 Hemiplegia and hemiparesis following unspecified cerebrovascular disease affecting left non-dominant side: Secondary | ICD-10-CM | POA: Diagnosis not present

## 2020-06-09 DIAGNOSIS — E039 Hypothyroidism, unspecified: Secondary | ICD-10-CM | POA: Diagnosis not present

## 2020-06-09 DIAGNOSIS — I69354 Hemiplegia and hemiparesis following cerebral infarction affecting left non-dominant side: Secondary | ICD-10-CM | POA: Diagnosis not present

## 2020-06-10 ENCOUNTER — Encounter: Payer: Medicare Other | Admitting: Physical Medicine & Rehabilitation

## 2020-06-10 DIAGNOSIS — G2581 Restless legs syndrome: Secondary | ICD-10-CM | POA: Diagnosis not present

## 2020-06-10 DIAGNOSIS — E039 Hypothyroidism, unspecified: Secondary | ICD-10-CM | POA: Diagnosis not present

## 2020-06-10 DIAGNOSIS — R488 Other symbolic dysfunctions: Secondary | ICD-10-CM | POA: Diagnosis not present

## 2020-06-10 DIAGNOSIS — I69354 Hemiplegia and hemiparesis following cerebral infarction affecting left non-dominant side: Secondary | ICD-10-CM | POA: Diagnosis not present

## 2020-06-10 DIAGNOSIS — I69328 Other speech and language deficits following cerebral infarction: Secondary | ICD-10-CM | POA: Diagnosis not present

## 2020-06-10 DIAGNOSIS — I63511 Cerebral infarction due to unspecified occlusion or stenosis of right middle cerebral artery: Secondary | ICD-10-CM | POA: Diagnosis not present

## 2020-06-10 DIAGNOSIS — E119 Type 2 diabetes mellitus without complications: Secondary | ICD-10-CM | POA: Diagnosis not present

## 2020-06-10 DIAGNOSIS — I69954 Hemiplegia and hemiparesis following unspecified cerebrovascular disease affecting left non-dominant side: Secondary | ICD-10-CM | POA: Diagnosis not present

## 2020-06-10 DIAGNOSIS — I10 Essential (primary) hypertension: Secondary | ICD-10-CM | POA: Diagnosis not present

## 2020-06-10 DIAGNOSIS — I69391 Dysphagia following cerebral infarction: Secondary | ICD-10-CM | POA: Diagnosis not present

## 2020-06-10 DIAGNOSIS — R1312 Dysphagia, oropharyngeal phase: Secondary | ICD-10-CM | POA: Diagnosis not present

## 2020-06-10 DIAGNOSIS — K2100 Gastro-esophageal reflux disease with esophagitis, without bleeding: Secondary | ICD-10-CM | POA: Diagnosis not present

## 2020-06-10 DIAGNOSIS — E785 Hyperlipidemia, unspecified: Secondary | ICD-10-CM | POA: Diagnosis not present

## 2020-06-10 NOTE — Progress Notes (Signed)
I agree with the above plan 

## 2020-06-11 DIAGNOSIS — R1312 Dysphagia, oropharyngeal phase: Secondary | ICD-10-CM | POA: Diagnosis not present

## 2020-06-11 DIAGNOSIS — E785 Hyperlipidemia, unspecified: Secondary | ICD-10-CM | POA: Diagnosis not present

## 2020-06-11 DIAGNOSIS — E039 Hypothyroidism, unspecified: Secondary | ICD-10-CM | POA: Diagnosis not present

## 2020-06-11 DIAGNOSIS — I69954 Hemiplegia and hemiparesis following unspecified cerebrovascular disease affecting left non-dominant side: Secondary | ICD-10-CM | POA: Diagnosis not present

## 2020-06-11 DIAGNOSIS — K2100 Gastro-esophageal reflux disease with esophagitis, without bleeding: Secondary | ICD-10-CM | POA: Diagnosis not present

## 2020-06-11 DIAGNOSIS — T148XXA Other injury of unspecified body region, initial encounter: Secondary | ICD-10-CM | POA: Diagnosis not present

## 2020-06-11 DIAGNOSIS — I69354 Hemiplegia and hemiparesis following cerebral infarction affecting left non-dominant side: Secondary | ICD-10-CM | POA: Diagnosis not present

## 2020-06-11 DIAGNOSIS — G2581 Restless legs syndrome: Secondary | ICD-10-CM | POA: Diagnosis not present

## 2020-06-11 DIAGNOSIS — I69328 Other speech and language deficits following cerebral infarction: Secondary | ICD-10-CM | POA: Diagnosis not present

## 2020-06-11 DIAGNOSIS — L89150 Pressure ulcer of sacral region, unstageable: Secondary | ICD-10-CM | POA: Diagnosis not present

## 2020-06-11 DIAGNOSIS — I10 Essential (primary) hypertension: Secondary | ICD-10-CM | POA: Diagnosis not present

## 2020-06-11 DIAGNOSIS — E119 Type 2 diabetes mellitus without complications: Secondary | ICD-10-CM | POA: Diagnosis not present

## 2020-06-12 DIAGNOSIS — I10 Essential (primary) hypertension: Secondary | ICD-10-CM | POA: Diagnosis not present

## 2020-06-12 DIAGNOSIS — E785 Hyperlipidemia, unspecified: Secondary | ICD-10-CM | POA: Diagnosis not present

## 2020-06-12 DIAGNOSIS — K2100 Gastro-esophageal reflux disease with esophagitis, without bleeding: Secondary | ICD-10-CM | POA: Diagnosis not present

## 2020-06-12 DIAGNOSIS — I69354 Hemiplegia and hemiparesis following cerebral infarction affecting left non-dominant side: Secondary | ICD-10-CM | POA: Diagnosis not present

## 2020-06-12 DIAGNOSIS — I69328 Other speech and language deficits following cerebral infarction: Secondary | ICD-10-CM | POA: Diagnosis not present

## 2020-06-12 DIAGNOSIS — E119 Type 2 diabetes mellitus without complications: Secondary | ICD-10-CM | POA: Diagnosis not present

## 2020-06-12 DIAGNOSIS — I69954 Hemiplegia and hemiparesis following unspecified cerebrovascular disease affecting left non-dominant side: Secondary | ICD-10-CM | POA: Diagnosis not present

## 2020-06-12 DIAGNOSIS — G2581 Restless legs syndrome: Secondary | ICD-10-CM | POA: Diagnosis not present

## 2020-06-12 DIAGNOSIS — E039 Hypothyroidism, unspecified: Secondary | ICD-10-CM | POA: Diagnosis not present

## 2020-06-12 DIAGNOSIS — R1312 Dysphagia, oropharyngeal phase: Secondary | ICD-10-CM | POA: Diagnosis not present

## 2020-06-13 DIAGNOSIS — E039 Hypothyroidism, unspecified: Secondary | ICD-10-CM | POA: Diagnosis not present

## 2020-06-13 DIAGNOSIS — I69354 Hemiplegia and hemiparesis following cerebral infarction affecting left non-dominant side: Secondary | ICD-10-CM | POA: Diagnosis not present

## 2020-06-13 DIAGNOSIS — E119 Type 2 diabetes mellitus without complications: Secondary | ICD-10-CM | POA: Diagnosis not present

## 2020-06-13 DIAGNOSIS — R1312 Dysphagia, oropharyngeal phase: Secondary | ICD-10-CM | POA: Diagnosis not present

## 2020-06-13 DIAGNOSIS — I10 Essential (primary) hypertension: Secondary | ICD-10-CM | POA: Diagnosis not present

## 2020-06-13 DIAGNOSIS — I69328 Other speech and language deficits following cerebral infarction: Secondary | ICD-10-CM | POA: Diagnosis not present

## 2020-06-13 DIAGNOSIS — E785 Hyperlipidemia, unspecified: Secondary | ICD-10-CM | POA: Diagnosis not present

## 2020-06-13 DIAGNOSIS — K2100 Gastro-esophageal reflux disease with esophagitis, without bleeding: Secondary | ICD-10-CM | POA: Diagnosis not present

## 2020-06-13 DIAGNOSIS — G2581 Restless legs syndrome: Secondary | ICD-10-CM | POA: Diagnosis not present

## 2020-06-13 DIAGNOSIS — I69954 Hemiplegia and hemiparesis following unspecified cerebrovascular disease affecting left non-dominant side: Secondary | ICD-10-CM | POA: Diagnosis not present

## 2020-06-16 DIAGNOSIS — I10 Essential (primary) hypertension: Secondary | ICD-10-CM | POA: Diagnosis not present

## 2020-06-16 DIAGNOSIS — E785 Hyperlipidemia, unspecified: Secondary | ICD-10-CM | POA: Diagnosis not present

## 2020-06-16 DIAGNOSIS — I69354 Hemiplegia and hemiparesis following cerebral infarction affecting left non-dominant side: Secondary | ICD-10-CM | POA: Diagnosis not present

## 2020-06-16 DIAGNOSIS — G2581 Restless legs syndrome: Secondary | ICD-10-CM | POA: Diagnosis not present

## 2020-06-16 DIAGNOSIS — E039 Hypothyroidism, unspecified: Secondary | ICD-10-CM | POA: Diagnosis not present

## 2020-06-16 DIAGNOSIS — R1312 Dysphagia, oropharyngeal phase: Secondary | ICD-10-CM | POA: Diagnosis not present

## 2020-06-16 DIAGNOSIS — I69328 Other speech and language deficits following cerebral infarction: Secondary | ICD-10-CM | POA: Diagnosis not present

## 2020-06-16 DIAGNOSIS — E119 Type 2 diabetes mellitus without complications: Secondary | ICD-10-CM | POA: Diagnosis not present

## 2020-06-16 DIAGNOSIS — K2100 Gastro-esophageal reflux disease with esophagitis, without bleeding: Secondary | ICD-10-CM | POA: Diagnosis not present

## 2020-06-16 DIAGNOSIS — I69954 Hemiplegia and hemiparesis following unspecified cerebrovascular disease affecting left non-dominant side: Secondary | ICD-10-CM | POA: Diagnosis not present

## 2020-06-17 ENCOUNTER — Ambulatory Visit (INDEPENDENT_AMBULATORY_CARE_PROVIDER_SITE_OTHER): Payer: Medicare Other | Admitting: *Deleted

## 2020-06-17 DIAGNOSIS — I63511 Cerebral infarction due to unspecified occlusion or stenosis of right middle cerebral artery: Secondary | ICD-10-CM | POA: Diagnosis not present

## 2020-06-17 DIAGNOSIS — E785 Hyperlipidemia, unspecified: Secondary | ICD-10-CM | POA: Diagnosis not present

## 2020-06-17 DIAGNOSIS — I69354 Hemiplegia and hemiparesis following cerebral infarction affecting left non-dominant side: Secondary | ICD-10-CM | POA: Diagnosis not present

## 2020-06-17 DIAGNOSIS — E119 Type 2 diabetes mellitus without complications: Secondary | ICD-10-CM | POA: Diagnosis not present

## 2020-06-17 DIAGNOSIS — K2100 Gastro-esophageal reflux disease with esophagitis, without bleeding: Secondary | ICD-10-CM | POA: Diagnosis not present

## 2020-06-17 DIAGNOSIS — G2581 Restless legs syndrome: Secondary | ICD-10-CM | POA: Diagnosis not present

## 2020-06-17 DIAGNOSIS — I10 Essential (primary) hypertension: Secondary | ICD-10-CM | POA: Diagnosis not present

## 2020-06-17 DIAGNOSIS — E039 Hypothyroidism, unspecified: Secondary | ICD-10-CM | POA: Diagnosis not present

## 2020-06-17 DIAGNOSIS — I69954 Hemiplegia and hemiparesis following unspecified cerebrovascular disease affecting left non-dominant side: Secondary | ICD-10-CM | POA: Diagnosis not present

## 2020-06-17 DIAGNOSIS — I69328 Other speech and language deficits following cerebral infarction: Secondary | ICD-10-CM | POA: Diagnosis not present

## 2020-06-17 DIAGNOSIS — R1312 Dysphagia, oropharyngeal phase: Secondary | ICD-10-CM | POA: Diagnosis not present

## 2020-06-17 LAB — CUP PACEART REMOTE DEVICE CHECK
Date Time Interrogation Session: 20210906135738
Implantable Pulse Generator Implant Date: 20210802

## 2020-06-18 DIAGNOSIS — G2581 Restless legs syndrome: Secondary | ICD-10-CM | POA: Diagnosis not present

## 2020-06-18 DIAGNOSIS — E785 Hyperlipidemia, unspecified: Secondary | ICD-10-CM | POA: Diagnosis not present

## 2020-06-18 DIAGNOSIS — K2100 Gastro-esophageal reflux disease with esophagitis, without bleeding: Secondary | ICD-10-CM | POA: Diagnosis not present

## 2020-06-18 DIAGNOSIS — I69954 Hemiplegia and hemiparesis following unspecified cerebrovascular disease affecting left non-dominant side: Secondary | ICD-10-CM | POA: Diagnosis not present

## 2020-06-18 DIAGNOSIS — E039 Hypothyroidism, unspecified: Secondary | ICD-10-CM | POA: Diagnosis not present

## 2020-06-18 DIAGNOSIS — I10 Essential (primary) hypertension: Secondary | ICD-10-CM | POA: Diagnosis not present

## 2020-06-18 DIAGNOSIS — I69328 Other speech and language deficits following cerebral infarction: Secondary | ICD-10-CM | POA: Diagnosis not present

## 2020-06-18 DIAGNOSIS — R1312 Dysphagia, oropharyngeal phase: Secondary | ICD-10-CM | POA: Diagnosis not present

## 2020-06-18 DIAGNOSIS — E119 Type 2 diabetes mellitus without complications: Secondary | ICD-10-CM | POA: Diagnosis not present

## 2020-06-18 DIAGNOSIS — I69354 Hemiplegia and hemiparesis following cerebral infarction affecting left non-dominant side: Secondary | ICD-10-CM | POA: Diagnosis not present

## 2020-06-18 NOTE — Progress Notes (Signed)
Carelink Summary Report / Loop Recorder 

## 2020-06-19 DIAGNOSIS — N390 Urinary tract infection, site not specified: Secondary | ICD-10-CM | POA: Diagnosis not present

## 2020-06-19 DIAGNOSIS — E119 Type 2 diabetes mellitus without complications: Secondary | ICD-10-CM | POA: Diagnosis not present

## 2020-06-19 DIAGNOSIS — E039 Hypothyroidism, unspecified: Secondary | ICD-10-CM | POA: Diagnosis not present

## 2020-06-19 DIAGNOSIS — I69354 Hemiplegia and hemiparesis following cerebral infarction affecting left non-dominant side: Secondary | ICD-10-CM | POA: Diagnosis not present

## 2020-06-19 DIAGNOSIS — R1312 Dysphagia, oropharyngeal phase: Secondary | ICD-10-CM | POA: Diagnosis not present

## 2020-06-19 DIAGNOSIS — I10 Essential (primary) hypertension: Secondary | ICD-10-CM | POA: Diagnosis not present

## 2020-06-19 DIAGNOSIS — I69954 Hemiplegia and hemiparesis following unspecified cerebrovascular disease affecting left non-dominant side: Secondary | ICD-10-CM | POA: Diagnosis not present

## 2020-06-19 DIAGNOSIS — E785 Hyperlipidemia, unspecified: Secondary | ICD-10-CM | POA: Diagnosis not present

## 2020-06-19 DIAGNOSIS — I69328 Other speech and language deficits following cerebral infarction: Secondary | ICD-10-CM | POA: Diagnosis not present

## 2020-06-19 DIAGNOSIS — G2581 Restless legs syndrome: Secondary | ICD-10-CM | POA: Diagnosis not present

## 2020-06-19 DIAGNOSIS — K2100 Gastro-esophageal reflux disease with esophagitis, without bleeding: Secondary | ICD-10-CM | POA: Diagnosis not present

## 2020-06-20 DIAGNOSIS — I10 Essential (primary) hypertension: Secondary | ICD-10-CM | POA: Diagnosis not present

## 2020-06-20 DIAGNOSIS — I69354 Hemiplegia and hemiparesis following cerebral infarction affecting left non-dominant side: Secondary | ICD-10-CM | POA: Diagnosis not present

## 2020-06-20 DIAGNOSIS — I69328 Other speech and language deficits following cerebral infarction: Secondary | ICD-10-CM | POA: Diagnosis not present

## 2020-06-20 DIAGNOSIS — I69954 Hemiplegia and hemiparesis following unspecified cerebrovascular disease affecting left non-dominant side: Secondary | ICD-10-CM | POA: Diagnosis not present

## 2020-06-20 DIAGNOSIS — E039 Hypothyroidism, unspecified: Secondary | ICD-10-CM | POA: Diagnosis not present

## 2020-06-20 DIAGNOSIS — G2581 Restless legs syndrome: Secondary | ICD-10-CM | POA: Diagnosis not present

## 2020-06-20 DIAGNOSIS — E785 Hyperlipidemia, unspecified: Secondary | ICD-10-CM | POA: Diagnosis not present

## 2020-06-20 DIAGNOSIS — K2100 Gastro-esophageal reflux disease with esophagitis, without bleeding: Secondary | ICD-10-CM | POA: Diagnosis not present

## 2020-06-20 DIAGNOSIS — R1312 Dysphagia, oropharyngeal phase: Secondary | ICD-10-CM | POA: Diagnosis not present

## 2020-06-20 DIAGNOSIS — L89150 Pressure ulcer of sacral region, unstageable: Secondary | ICD-10-CM | POA: Diagnosis not present

## 2020-06-20 DIAGNOSIS — E119 Type 2 diabetes mellitus without complications: Secondary | ICD-10-CM | POA: Diagnosis not present

## 2020-06-21 DIAGNOSIS — I69954 Hemiplegia and hemiparesis following unspecified cerebrovascular disease affecting left non-dominant side: Secondary | ICD-10-CM | POA: Diagnosis not present

## 2020-06-21 DIAGNOSIS — R1312 Dysphagia, oropharyngeal phase: Secondary | ICD-10-CM | POA: Diagnosis not present

## 2020-06-21 DIAGNOSIS — I69328 Other speech and language deficits following cerebral infarction: Secondary | ICD-10-CM | POA: Diagnosis not present

## 2020-06-21 DIAGNOSIS — I69354 Hemiplegia and hemiparesis following cerebral infarction affecting left non-dominant side: Secondary | ICD-10-CM | POA: Diagnosis not present

## 2020-06-21 DIAGNOSIS — E119 Type 2 diabetes mellitus without complications: Secondary | ICD-10-CM | POA: Diagnosis not present

## 2020-06-21 DIAGNOSIS — I10 Essential (primary) hypertension: Secondary | ICD-10-CM | POA: Diagnosis not present

## 2020-06-21 DIAGNOSIS — E039 Hypothyroidism, unspecified: Secondary | ICD-10-CM | POA: Diagnosis not present

## 2020-06-21 DIAGNOSIS — K2100 Gastro-esophageal reflux disease with esophagitis, without bleeding: Secondary | ICD-10-CM | POA: Diagnosis not present

## 2020-06-21 DIAGNOSIS — G2581 Restless legs syndrome: Secondary | ICD-10-CM | POA: Diagnosis not present

## 2020-06-21 DIAGNOSIS — E785 Hyperlipidemia, unspecified: Secondary | ICD-10-CM | POA: Diagnosis not present

## 2020-06-23 DIAGNOSIS — K2100 Gastro-esophageal reflux disease with esophagitis, without bleeding: Secondary | ICD-10-CM | POA: Diagnosis not present

## 2020-06-23 DIAGNOSIS — I69354 Hemiplegia and hemiparesis following cerebral infarction affecting left non-dominant side: Secondary | ICD-10-CM | POA: Diagnosis not present

## 2020-06-23 DIAGNOSIS — E039 Hypothyroidism, unspecified: Secondary | ICD-10-CM | POA: Diagnosis not present

## 2020-06-23 DIAGNOSIS — I10 Essential (primary) hypertension: Secondary | ICD-10-CM | POA: Diagnosis not present

## 2020-06-23 DIAGNOSIS — R1312 Dysphagia, oropharyngeal phase: Secondary | ICD-10-CM | POA: Diagnosis not present

## 2020-06-23 DIAGNOSIS — E785 Hyperlipidemia, unspecified: Secondary | ICD-10-CM | POA: Diagnosis not present

## 2020-06-23 DIAGNOSIS — I69954 Hemiplegia and hemiparesis following unspecified cerebrovascular disease affecting left non-dominant side: Secondary | ICD-10-CM | POA: Diagnosis not present

## 2020-06-23 DIAGNOSIS — I69328 Other speech and language deficits following cerebral infarction: Secondary | ICD-10-CM | POA: Diagnosis not present

## 2020-06-23 DIAGNOSIS — G2581 Restless legs syndrome: Secondary | ICD-10-CM | POA: Diagnosis not present

## 2020-06-23 DIAGNOSIS — E119 Type 2 diabetes mellitus without complications: Secondary | ICD-10-CM | POA: Diagnosis not present

## 2020-06-23 DIAGNOSIS — N390 Urinary tract infection, site not specified: Secondary | ICD-10-CM | POA: Diagnosis not present

## 2020-06-24 ENCOUNTER — Encounter: Payer: Self-pay | Admitting: Physical Medicine & Rehabilitation

## 2020-06-24 ENCOUNTER — Other Ambulatory Visit: Payer: Self-pay

## 2020-06-24 ENCOUNTER — Encounter: Payer: Medicare Other | Attending: Physical Medicine & Rehabilitation | Admitting: Physical Medicine & Rehabilitation

## 2020-06-24 VITALS — BP 104/64 | HR 64 | Temp 98.0°F | Ht 67.0 in | Wt 149.0 lb

## 2020-06-24 DIAGNOSIS — I69354 Hemiplegia and hemiparesis following cerebral infarction affecting left non-dominant side: Secondary | ICD-10-CM | POA: Diagnosis not present

## 2020-06-24 DIAGNOSIS — E119 Type 2 diabetes mellitus without complications: Secondary | ICD-10-CM | POA: Diagnosis not present

## 2020-06-24 DIAGNOSIS — M792 Neuralgia and neuritis, unspecified: Secondary | ICD-10-CM

## 2020-06-24 DIAGNOSIS — I10 Essential (primary) hypertension: Secondary | ICD-10-CM | POA: Diagnosis not present

## 2020-06-24 DIAGNOSIS — I69398 Other sequelae of cerebral infarction: Secondary | ICD-10-CM | POA: Insufficient documentation

## 2020-06-24 DIAGNOSIS — E039 Hypothyroidism, unspecified: Secondary | ICD-10-CM | POA: Diagnosis not present

## 2020-06-24 DIAGNOSIS — E785 Hyperlipidemia, unspecified: Secondary | ICD-10-CM | POA: Diagnosis not present

## 2020-06-24 DIAGNOSIS — G8110 Spastic hemiplegia affecting unspecified side: Secondary | ICD-10-CM

## 2020-06-24 NOTE — Patient Instructions (Addendum)
Start Gabapentin for stroke related nerve pain   Schedule for Botox Left arm and shoulder area

## 2020-06-24 NOTE — Progress Notes (Signed)
Subjective:    Patient ID: Meredith Shaw, female    DOB: [birth year], 74 y.o.   MRN: [id number] 74 y.o. right-handed female with history of type 2 diabetes mellitus complicated by retinopathy and hypertension.  Patient lives with spouse 1 level home 2 steps to entry.  Reportedly ambulating without assistive device prior to admission but sedentary.  Presented 03/23/2020 with dysarthria and left side weakness.  CT of the head was negative and TPA was administered.  CT angiogram revealed distal right MCA M1 occlusion with moderate collateral flow and right MCA.  She underwent cerebral angiogram with revascularization per interventional radiology.  She did require short-term intubation.  Hospital course complicated by coffee-ground emesis and low-grade fever worsening of mental status.  Due to concerns for urosepsis she was started on IV meropenem for a short time.  Repeat CT of the head showed progressive right MCA infarction without evidence of reocclusion.  Follow-up MRI personally reviewed showing multiple right MCA infarcts.  Per report large evolving acute ischemic right MCA territory infarction with associated petechial hemorrhage, gyral swelling edema with mass regional mass-effect and additional scattered nonhemorrhagic infarcts in the right PCA territory, left frontal white matter and loss of flow with reocclusion distal right M1 M2 occlusion.  Bilateral lower extremity Dopplers negative.  Echocardiogram with ejection fraction of 70 to 75% with possible echodensity attached to the posterior mitral valve.  TEE ordered for work-up was negative for vegetation or thrombus and bubble study negative.Loop recorder placed 04/11/2020.  EEG completed due to lethargy showed sharp waves in right frontal region and slow generalized lateralization in right hemisphere due to underlying infarct.  No definite seizure identified.  Urology consulted 04/07/2020 Dr. Annabell Howells for hematuria, felt to be related to anticoagulation  with noted urine cultures Klebsiella placed on Ancef.  A recent renal ultrasound was completed during hospital course showed no mass or hydronephrosis.  Maintain on aspirin Plavix for CVA prophylaxis Plavix discontinued due to bouts of hematuria.  Patient had been cleared for subcutaneous heparin for DVT prophylaxis.  Hospital course dysphagia #1 honey thick liquid diet nasogastric tube removed 04/05/2020.  Patient was admitted for a comprehensive rehab program   Admit date: 04/11/2020 Discharge date: 05/05/2020    HPI  74 year old female who completed inpatient rehabilitation for right MCA infarct on 05/05/2020.  She is here for follow-up appointment.  She is now residing in a skilled nursing facility near [facility].  Her daughter looks in on her.  Her husband is unable to care for her at home and she will be residing at the skilled nursing facility as a long-term patient  The patient is intermittently somnolent which the daughter states has been since her stroke.  The patient's daughter gives a history that there has been increasing left upper extremity pain since leaving the hospital.  She is also had intermittent UTIs and follows up with urology.  She now has a chronic Foley. In addition there has been increasing spasm in the left elbow flexors.  The patient has been getting PT and OT but now that she is moving to the long-term care the knee frequency will be diminished. Pain Inventory Average Pain 10 Pain Right Now 10 My pain is constant  LOCATION OF PAIN  Right wrist hand fingers, buttocks (pressure sore)  BOWEL Number of stools per week: 7 Oral laxative use No  Type of laxative no Enema or suppository use No  History of colostomy No  Incontinent No   BLADDER Foley  In and out cath, frequency ? Able to self cath No  Bladder incontinence Yes  Frequent urination No  Leakage with coughing No  Difficulty starting stream No  Incomplete bladder emptying Yes     Mobility ability to climb steps?  no do you drive?  no use a wheelchair needs help with transfers  Function retired I need assistance with the following:  feeding, dressing, bathing, toileting, meal prep, household duties and shopping  Neuro/Psych bladder control problems tingling trouble walking confusion  Prior Studies hospital f/u  Physicians involved in your care hospital f/u   Family History  Problem Relation Age of Onset  . Heart disease Mother   . Breast cancer Mother        14's  . Stroke Father   . Heart disease Father   . Healthy Daughter   . Healthy Son   . Liver disease Maternal Grandmother        gall stones imbedded in liver - blead to death when in surgery   . Pneumonia Maternal Grandfather   . Diabetes Paternal Grandmother    Social History   Socioeconomic History  . Marital status: Married    Spouse name: Earvin Hansen   . Number of children: 2  . Years of education: 64  . Highest education level: Some college, no degree  Occupational History  . 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 Smoking status: Never Smoker  . Smokeless tobacco: Never Used  Vaping Use  . Vaping Use: Never used  Substance and Sexual Activity  . Alcohol use: No  . Drug use: No  . Sexual activity: Yes    Birth control/protection: Surgical  Other Topics Concern  . Not on file  Social History Narrative  . Not on file   Social Determinants of Health   Financial Resource Strain: Low Risk   . Difficulty of Paying Living Expenses: Not hard at all  Food Insecurity: No Food Insecurity  . Worried About Programme researcher, broadcasting/film/video in the Last Year: Never true  . Ran Out of Food in the Last Year: Never true  Transportation Needs: No Transportation Needs  . Lack of Transportation (Medical): No  . Lack of Transportation (Non-Medical): No  Physical Activity: Sufficiently Active  . Days of Exercise per Week:  7 days  . Minutes of Exercise per Session: 30 min  Stress: No Stress Concern Present  . Feeling of Stress : Not at all  Social Connections: Socially Integrated  . Frequency of Communication with Friends and Family: More than three times a week  . Frequency of Social Gatherings with Friends and Family: More than three times a week  . Attends Religious Services: More than 4 times per year  . Active Member of Clubs or Organizations: Yes  . Attends Banker Meetings: More than 4 times per year  . Marital Status: Married   Past Surgical History:  Procedure Laterality Date  . ABDOMINAL HYSTERECTOMY    . BUBBLE STUDY  03/26/2020   Procedure: BUBBLE STUDY;  Surgeon: Lars Masson, MD;  Location: [facility] ENDOSCOPY;  Service: Cardiovascular;;  . CATARACT EXTRACTION    . CHOLECYSTECTOMY  1972  . COLONOSCOPY    . EYE SURGERY     RIGHT CATARACT EXTRACTON WITH LENS IMPLANT  . GROWTH REMOVED FROM THYROID    . INTRAMEDULLARY (IM) NAIL INTERTROCHANTERIC Left 02/10/2018  Procedure: INTRAMEDULLARY (IM) NAIL INTERTROCHANTRIC;  Surgeon: Kathryne HitchBlackman, Christopher Y, MD;  Location: [facility] OR;  Service: Orthopedics;  Laterality: Left;  . INTRAMEDULLARY (IM) NAIL INTERTROCHANTERIC Right 08/20/2018   Procedure: ORIF RIGHT HIP INTERTROC FRACTURE;  Surgeon: Kathryne HitchBlackman, Christopher Y, MD;  Location: [facility] OR;  Service: Orthopedics;  Laterality: Right;  . IR CT HEAD LTD  03/24/2020  . IR PERCUTANEOUS ART THROMBECTOMY/INFUSION INTRACRANIAL INC DIAG ANGIO  03/24/2020  . left hip surgery   02/2018  . LOOP RECORDER INSERTION N/A 04/11/2020   Procedure: LOOP RECORDER INSERTION;  Surgeon: Regan Lemmingamnitz, Will Martin, MD;  [facility]: [facility] INVASIVE CV LAB;  Service: Cardiovascular;  Laterality: N/A;  . ORIF HIP FRACTURE Right 08/20/2018  . PERCUTANEOUS NEPHROLITHOTOMY    . RADIOLOGY WITH ANESTHESIA N/A 03/23/2020   Procedure: IR WITH ANESTHESIA;  Surgeon: Julieanne Cottoneveshwar, Sanjeev, MD;  Location: [facility] OR;  Service: Radiology;  Laterality: N/A;   . TEE WITHOUT CARDIOVERSION N/A 03/26/2020   Procedure: TRANSESOPHAGEAL ECHOCARDIOGRAM (TEE);  Surgeon: Lars MassonNelson, Katarina H, MD;  [facility];  Service: Cardiovascular;  Laterality: N/A;  . TOTAL KNEE ARTHROPLASTY  06/09/2012   Procedure: TOTAL KNEE ARTHROPLASTY;  Surgeon: Kathryne Hitchhristopher Y Blackman, MD;  Location: [facility] ORS;  Service: Orthopedics;  Laterality: Right;  Right Total Knee Arthroplasty  . TOTAL KNEE ARTHROPLASTY Left 04/09/2014   Procedure: LEFT TOTAL KNEE ARTHROPLASTY;  Surgeon: Kathryne Hitchhristopher Y Blackman, MD;  [facility];  Service: Orthopedics;  Laterality: Left;  . TUBAL LIGATION     Past Medical History:  Diagnosis Date  . Arthritis    OA AND PAIN BOTH KNEES -RIGHT KNEE HURTS WORSE THAN LEFT  . Cataract   . Diabetes mellitus    ORAL MEDICATION - NO INSULIN  . Hypertension   . Hypertensive retinopathy    OU  . Hypothyroidism   . Kidney stones    NONE AT PRESENT TIME THAT PT IS AWARE OF  . Osteoporosis    BP 104/64   Pulse 64   Temp 98 F (36.7 C)   Ht 5\' 7"  (1.702 m)   Wt 149 lb (67.6 kg)   SpO2 98%   BMI 23.34 kg/m   Opioid Risk Score:   Fall Risk Score:  `1  Depression screen PHQ 2/9  [facility] 02/21/2020 11/15/2019 08/16/2019 08/15/2019 05/14/2019 11/09/2018 08/09/2018  Decreased Interest 0 0 0 0 0 0 0  Down, Depressed, Hopeless 0 0 0 0 0 0 0  PHQ - 2 Score 0 0 0 0 0 0 0  Altered sleeping - - - - - - -  Tired, decreased energy - - - - - - -  Change in appetite - - - - - - -  Feeling bad or failure about yourself  - - - - - - -  Trouble concentrating - - - - - - -  Moving slowly or fidgety/restless - - - - - - -  Suicidal thoughts - - - - - - -  PHQ-9 Score - - - - - - -  Some recent data might be hidden    Review of Systems  Constitutional: Positive for unexpected weight change.  HENT: Negative.   Eyes: Negative.   Respiratory: Negative.   Cardiovascular: Negative.   Gastrointestinal: Negative.   Genitourinary: Positive for  difficulty urinating.  Musculoskeletal: Positive for arthralgias and gait problem.       Spasticity  Skin: Positive for rash and wound.  Allergic/Immunologic: Negative.   Neurological:  Tingling  Psychiatric/Behavioral: Positive for confusion.  All other systems reviewed and are negative.      Objective:   Physical Exam Vitals and nursing note reviewed.  Constitutional:      Comments: Intermittently alert and intermittently somnolent  HENT:     Head: Normocephalic and atraumatic.     Mouth/Throat:     Mouth: Mucous membranes are dry.     Pharynx: Oropharynx is clear.  Eyes:     Extraocular Movements: Extraocular movements intact.     Pupils: Pupils are equal, round, and reactive to light.  Neurological:     Mental Status: She is alert.     Cranial Nerves: Dysarthria and facial asymmetry present.     Sensory: Sensory deficit present.     Comments: 0/5 strength in the left upper extremity 3 - in the left knee extensors and hip extensors.  Trace ankle dorsiflexors to minus ankle plantar flexor Motor strength is normal on the right side Sensation is diminished, in fact absent left upper extremity There is also hypersensitivity with light palpation in the left hand there are no vasomotor changes noted.  Patient is nonambulatory  Tone MAS 3/4 in the left elbow flexors MAS 3 in the left pectoralis MAS 0 in the finger flexors and wrist flexors.           Assessment & Plan:  #1.  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 Recommend botulinum toxin 300 units to left upper extremity Left pectoralis 50 units Left biceps 200 units Left brachioradialis 50 units  2.  Neurogenic pain post stroke, start low-dose gabapentin 100 mg twice daily monitor for increased sedation, may need to switch this to nighttime only if she cannot tolerate this during the day

## 2020-06-25 DIAGNOSIS — L89153 Pressure ulcer of sacral region, stage 3: Secondary | ICD-10-CM | POA: Diagnosis not present

## 2020-06-26 DIAGNOSIS — E039 Hypothyroidism, unspecified: Secondary | ICD-10-CM | POA: Diagnosis not present

## 2020-06-26 DIAGNOSIS — I10 Essential (primary) hypertension: Secondary | ICD-10-CM | POA: Diagnosis not present

## 2020-06-27 DIAGNOSIS — E039 Hypothyroidism, unspecified: Secondary | ICD-10-CM | POA: Diagnosis not present

## 2020-06-27 DIAGNOSIS — E119 Type 2 diabetes mellitus without complications: Secondary | ICD-10-CM | POA: Diagnosis not present

## 2020-06-27 DIAGNOSIS — G2581 Restless legs syndrome: Secondary | ICD-10-CM | POA: Diagnosis not present

## 2020-06-27 DIAGNOSIS — I69954 Hemiplegia and hemiparesis following unspecified cerebrovascular disease affecting left non-dominant side: Secondary | ICD-10-CM | POA: Diagnosis not present

## 2020-06-27 DIAGNOSIS — R1312 Dysphagia, oropharyngeal phase: Secondary | ICD-10-CM | POA: Diagnosis not present

## 2020-06-27 DIAGNOSIS — E785 Hyperlipidemia, unspecified: Secondary | ICD-10-CM | POA: Diagnosis not present

## 2020-06-27 DIAGNOSIS — I10 Essential (primary) hypertension: Secondary | ICD-10-CM | POA: Diagnosis not present

## 2020-06-27 DIAGNOSIS — I69328 Other speech and language deficits following cerebral infarction: Secondary | ICD-10-CM | POA: Diagnosis not present

## 2020-06-27 DIAGNOSIS — I69354 Hemiplegia and hemiparesis following cerebral infarction affecting left non-dominant side: Secondary | ICD-10-CM | POA: Diagnosis not present

## 2020-06-27 DIAGNOSIS — K2100 Gastro-esophageal reflux disease with esophagitis, without bleeding: Secondary | ICD-10-CM | POA: Diagnosis not present

## 2020-07-01 DIAGNOSIS — E119 Type 2 diabetes mellitus without complications: Secondary | ICD-10-CM | POA: Diagnosis not present

## 2020-07-02 DIAGNOSIS — L89153 Pressure ulcer of sacral region, stage 3: Secondary | ICD-10-CM | POA: Diagnosis not present

## 2020-07-08 ENCOUNTER — Encounter (HOSPITAL_BASED_OUTPATIENT_CLINIC_OR_DEPARTMENT_OTHER): Payer: Medicare Other | Admitting: Physical Medicine & Rehabilitation

## 2020-07-08 ENCOUNTER — Encounter: Payer: Self-pay | Admitting: Physical Medicine & Rehabilitation

## 2020-07-08 ENCOUNTER — Other Ambulatory Visit: Payer: Self-pay

## 2020-07-08 VITALS — BP 91/61 | HR 57 | Temp 98.0°F

## 2020-07-08 DIAGNOSIS — M792 Neuralgia and neuritis, unspecified: Secondary | ICD-10-CM | POA: Diagnosis not present

## 2020-07-08 DIAGNOSIS — G8110 Spastic hemiplegia affecting unspecified side: Secondary | ICD-10-CM | POA: Diagnosis not present

## 2020-07-08 DIAGNOSIS — I69398 Other sequelae of cerebral infarction: Secondary | ICD-10-CM | POA: Diagnosis not present

## 2020-07-08 NOTE — Progress Notes (Signed)
Botox Injection for spasticity using needle EMG guidance  Dilution: 50 Units/ml Indication: Severe spasticity which interferes with ADL,mobility and/or  hygiene and is unresponsive to medication management and other conservative care Informed consent was obtained after describing risks and benefits of the procedure with the patient. This includes bleeding, bruising, infection, excessive weakness, or medication side effects. A REMS form is on file and signed. Needle: 27g 1" needle electrode Number of units per muscle Left pectoralis 50 units Left biceps 200 units Left brachioradialis 50 units All injections were done after obtaining appropriate EMG activity and after negative drawback for blood. The patient tolerated the procedure well. Post procedure instructions were given. A followup appointment was made.

## 2020-07-08 NOTE — Patient Instructions (Signed)

## 2020-07-11 ENCOUNTER — Telehealth: Payer: Self-pay | Admitting: Cardiovascular Disease

## 2020-07-11 NOTE — Telephone Encounter (Signed)
Patient's daughter called wanting to switch her mother's care from Dr. Tresa Endo to Dr. Okey Dupre do to location.  Are you both in agreement with this?

## 2020-07-14 ENCOUNTER — Other Ambulatory Visit: Payer: Self-pay

## 2020-07-14 NOTE — Telephone Encounter (Signed)
That is fine with me.  Madisyn Mawhinney, MD [facility]  

## 2020-07-14 NOTE — Patient Outreach (Signed)
[facility]) Care Management  07/14/2020  Meredith Shaw 09/02/1946 [id number]  Telephone outreach to patient to obtain mRS was successfully completed. MRS=5.  Domingo Cocking [facility] Care Management Assistant [phone]

## 2020-07-16 ENCOUNTER — Encounter: Payer: Self-pay | Admitting: Internal Medicine

## 2020-07-21 ENCOUNTER — Ambulatory Visit (INDEPENDENT_AMBULATORY_CARE_PROVIDER_SITE_OTHER): Payer: Medicaid Other

## 2020-07-21 DIAGNOSIS — I63511 Cerebral infarction due to unspecified occlusion or stenosis of right middle cerebral artery: Secondary | ICD-10-CM | POA: Diagnosis not present

## 2020-07-21 LAB — CUP PACEART REMOTE DEVICE CHECK
Date Time Interrogation Session: 20211009135347
Implantable Pulse Generator Implant Date: 20210802

## 2020-07-22 ENCOUNTER — Ambulatory Visit: Payer: Medicare Other | Admitting: Cardiovascular Disease

## 2020-07-23 NOTE — Progress Notes (Signed)
Carelink Summary Report / Loop Recorder 

## 2020-07-27 NOTE — Telephone Encounter (Signed)
Ok with me 

## 2020-07-31 ENCOUNTER — Encounter: Payer: Self-pay | Admitting: Internal Medicine

## 2020-08-06 ENCOUNTER — Ambulatory Visit: Payer: Self-pay | Admitting: Urology

## 2020-08-14 ENCOUNTER — Ambulatory Visit: Payer: Medicaid Other | Admitting: Internal Medicine

## 2020-08-18 ENCOUNTER — Emergency Department
Admission: EM | Admit: 2020-08-18 | Discharge: 2020-08-18 | Disposition: A | Discharge status: Home / Self Care | Payer: Medicare Other | Attending: Emergency Medicine | Admitting: Emergency Medicine

## 2020-08-18 ENCOUNTER — Other Ambulatory Visit: Payer: Self-pay

## 2020-08-18 DIAGNOSIS — Z96653 Presence of artificial knee joint, bilateral: Secondary | ICD-10-CM | POA: Diagnosis not present

## 2020-08-18 DIAGNOSIS — N1831 Chronic kidney disease, stage 3a: Secondary | ICD-10-CM | POA: Diagnosis not present

## 2020-08-18 DIAGNOSIS — E1165 Type 2 diabetes mellitus with hyperglycemia: Secondary | ICD-10-CM | POA: Insufficient documentation

## 2020-08-18 DIAGNOSIS — N3000 Acute cystitis without hematuria: Secondary | ICD-10-CM | POA: Diagnosis not present

## 2020-08-18 DIAGNOSIS — Z794 Long term (current) use of insulin: Secondary | ICD-10-CM | POA: Insufficient documentation

## 2020-08-18 DIAGNOSIS — Z7982 Long term (current) use of aspirin: Secondary | ICD-10-CM | POA: Insufficient documentation

## 2020-08-18 DIAGNOSIS — Z79899 Other long term (current) drug therapy: Secondary | ICD-10-CM | POA: Diagnosis not present

## 2020-08-18 DIAGNOSIS — E860 Dehydration: Secondary | ICD-10-CM

## 2020-08-18 DIAGNOSIS — Z7984 Long term (current) use of oral hypoglycemic drugs: Secondary | ICD-10-CM | POA: Diagnosis not present

## 2020-08-18 DIAGNOSIS — E1122 Type 2 diabetes mellitus with diabetic chronic kidney disease: Secondary | ICD-10-CM | POA: Insufficient documentation

## 2020-08-18 DIAGNOSIS — I129 Hypertensive chronic kidney disease with stage 1 through stage 4 chronic kidney disease, or unspecified chronic kidney disease: Secondary | ICD-10-CM | POA: Insufficient documentation

## 2020-08-18 DIAGNOSIS — E039 Hypothyroidism, unspecified: Secondary | ICD-10-CM | POA: Insufficient documentation

## 2020-08-18 DIAGNOSIS — R531 Weakness: Secondary | ICD-10-CM | POA: Diagnosis present

## 2020-08-18 LAB — URINALYSIS, COMPLETE (UACMP) WITH MICROSCOPIC
Bilirubin Urine: NEGATIVE
Glucose, UA: NEGATIVE mg/dL
Ketones, ur: 5 mg/dL — AB
Nitrite: NEGATIVE
Protein, ur: 30 mg/dL — AB
Specific Gravity, Urine: 1.02 (ref 1.005–1.030)
pH: 5 (ref 5.0–8.0)

## 2020-08-18 LAB — CBC WITH DIFFERENTIAL/PLATELET
Abs Immature Granulocytes: 0.02 10*3/uL (ref 0.00–0.07)
Basophils Absolute: 0 10*3/uL (ref 0.0–0.1)
Basophils Relative: 1 %
Eosinophils Absolute: 0.1 10*3/uL (ref 0.0–0.5)
Eosinophils Relative: 2 %
HCT: 38.8 % (ref 36.0–46.0)
Hemoglobin: 12.6 g/dL (ref 12.0–15.0)
Immature Granulocytes: 0 %
Lymphocytes Relative: 15 %
Lymphs Abs: 1.1 10*3/uL (ref 0.7–4.0)
MCH: 27.8 pg (ref 26.0–34.0)
MCHC: 32.5 g/dL (ref 30.0–36.0)
MCV: 85.5 fL (ref 80.0–100.0)
Monocytes Absolute: 0.4 10*3/uL (ref 0.1–1.0)
Monocytes Relative: 6 %
Neutro Abs: 5.7 10*3/uL (ref 1.7–7.7)
Neutrophils Relative %: 76 %
Platelets: 275 10*3/uL (ref 150–400)
RBC: 4.54 MIL/uL (ref 3.87–5.11)
RDW: 14.6 % (ref 11.5–15.5)
WBC: 7.4 10*3/uL (ref 4.0–10.5)
nRBC: 0 % (ref 0.0–0.2)

## 2020-08-18 LAB — COMPREHENSIVE METABOLIC PANEL
ALT: 19 U/L (ref 0–44)
AST: 19 U/L (ref 15–41)
Albumin: 3.3 g/dL — ABNORMAL LOW (ref 3.5–5.0)
Alkaline Phosphatase: 91 U/L (ref 38–126)
Anion gap: 8 (ref 5–15)
BUN: 35 mg/dL — ABNORMAL HIGH (ref 8–23)
CO2: 26 mmol/L (ref 22–32)
Calcium: 9.7 mg/dL (ref 8.9–10.3)
Chloride: 108 mmol/L (ref 98–111)
Creatinine, Ser: 1.19 mg/dL — ABNORMAL HIGH (ref 0.44–1.00)
GFR, Estimated: 48 mL/min — ABNORMAL LOW (ref >60)
Glucose, Bld: 190 mg/dL — ABNORMAL HIGH (ref 70–99)
Potassium: 4.3 mmol/L (ref 3.5–5.1)
Sodium: 142 mmol/L (ref 135–145)
Total Bilirubin: 0.7 mg/dL (ref 0.3–1.2)
Total Protein: 7.3 g/dL (ref 6.5–8.1)

## 2020-08-18 LAB — LACTIC ACID, PLASMA
Lactic Acid, Venous: 1.1 mmol/L (ref 0.5–1.9)
Lactic Acid, Venous: 1.1 mmol/L (ref 0.5–1.9)

## 2020-08-18 LAB — TROPONIN I (HIGH SENSITIVITY)
Troponin I (High Sensitivity): 6 ng/L (ref <18)
Troponin I (High Sensitivity): 6 ng/L (ref <18)

## 2020-08-18 MED ORDER — CEFPODOXIME PROXETIL 100 MG PO TABS: 100.0000 mg | ORAL_TABLET | Freq: Two times a day (BID) | ORAL | 0 refills | Status: AC

## 2020-08-18 MED ORDER — SODIUM CHLORIDE 0.9 % IV BOLUS
1000.0000 mL | Freq: Once | INTRAVENOUS | Status: AC
  Administered 2020-08-18: 1000 mL via INTRAVENOUS

## 2020-08-18 MED ORDER — SODIUM CHLORIDE 0.9 % IV SOLN
2.0000 g | Freq: Once | INTRAVENOUS | Status: AC
  Administered 2020-08-18: 2 g via INTRAVENOUS
  Filled 2020-08-18: qty 20

## 2020-08-18 MED ORDER — CEFPODOXIME PROXETIL 100 MG PO TABS: 100.0000 mg | ORAL_TABLET | Freq: Two times a day (BID) | ORAL | 0 refills | Status: DC

## 2020-08-18 NOTE — ED Triage Notes (Signed)
PT to ED via EMS from a dr's appt. PT had a routine appt this morning, when they ran her BP states systolic was in 60's. No fluids given by EMS. BP 90/61 here.

## 2020-08-18 NOTE — Discharge Instructions (Addendum)
Please encourage Ms. Luberto to drink.  Make sure that she has ready access to water and drinks at least 6 to 8 glasses daily.  We are going to start an antibiotic for a catheter associated UTI.  Catheter has been replaced today.  She should follow up with urology as scheduled.

## 2020-08-18 NOTE — ED Provider Notes (Signed)
[facility] Emergency Department Provider Note  ____________________________________________   First MD Initiated Contact with Patient 08/18/20 1346     (approximate)  I have reviewed the triage vital signs and the nursing notes.   HISTORY  Chief Complaint Hypotension    HPI Meredith Shaw is a 74 y.o. female here with generalized weakness.  The patient recently sustained a stroke, and is currently in a nursing facility.  Per report, she had a routine checkup at a nephrologist office today and was noted to be hypotensive so was sent here for further evaluation.  The patient states that over the last 2 weeks, she has not been able to see her family because her roommate had Covid.  She has tested negative for Covid repeatedly.  She states that normally her family is able to help her eat and drink, but they have not been able to help, so she has not been eating and drinking much.  She states she is been feeling generally weak.  She states the staff at the facility is not very good about getting her medications or drink when she needs it.  She denies any known fevers or chills.  No nausea or vomiting.  She has a indwelling Foley since her stroke, and is scheduled for a urologist appointment this week.  She is had recurrent UTIs with her Foley.  She is unsure when it was last changed.  She does have some mild suprapubic tenderness.        Past Medical History:  Diagnosis Date  . Arthritis    OA AND PAIN BOTH KNEES -RIGHT KNEE HURTS WORSE THAN LEFT  . Cataract   . Diabetes mellitus    ORAL MEDICATION - NO INSULIN  . Hypertension   . Hypertensive retinopathy    OU  . Hypothyroidism   . Kidney stones    NONE AT PRESENT TIME THAT PT IS AWARE OF  . Osteoporosis     Patient Active Problem List   Diagnosis Date Noted  . 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 Essential hypertension   . Stage 3a chronic kidney  disease (HCC)   . Left hemiplegia (HCC)   . Spastic hemiplegia affecting nondominant side (HCC)   . Uncontrolled type 2 diabetes mellitus with hyperglycemia (HCC)   . Dysphagia, post-stroke   . Labile blood pressure   . Dysphagia due to recent cerebral infarction 04/11/2020  . 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 Slow transit constipation   . Dyslipidemia   . Hemorrhagic cystitis   . Benign essential HTN   . UTI (urinary tract infection), bacterial   . Urinary tract infection with hematuria   . AKI (acute kidney injury) (HCC)   . Acute ischemic right MCA stroke [facility]) s/p tPA & attempted revascularization, embolic stroke, source unknown 03/24/2020  . 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 Acquired hypothyroidism 08/15/2019  . Hyperlipidemia associated with type 2 diabetes mellitus (HCC) 05/14/2019  . Diabetic retinopathy (HCC) 11/09/2018  . Closed fracture of femur, intertrochanteric, right, initial encounter (HCC) 08/19/2018  . Type 2 diabetes mellitus with complication, without long-term current use of insulin (HCC) 02/15/2018  . Closed left hip fracture (HCC) 02/10/2018  . CKD stage 3 due to type 2 diabetes mellitus (HCC) 02/10/2018  . Hip fracture (HCC) 02/10/2018  . Pressure injury of skin 02/10/2018  . Arthritis of left knee 04/09/2014  . Status post total knee replacement 04/09/2014  .  Arthropathy   . Hypertension associated with type 2 diabetes mellitus (HCC)   . Kidney stones   . Postablative hypothyroidism   . Degenerative arthritis of knee 06/09/2012  . Flatulence, eructation, and gas pain 12/24/2005  . Abdominal or pelvic swelling, mass, or lump, other specified site 12/24/2005  . Age-related osteoporosis with current pathological fracture with routine healing 12/04/2004  . Symptomatic menopausal or female climacteric states 12/04/2004  . Arthropathy, lower leg 04/21/2004  . Hematuria 09/17/2003  . Other long term (current) drug  therapy 07/05/2002    Past Surgical History:  Procedure Laterality Date  . ABDOMINAL HYSTERECTOMY    . BUBBLE STUDY  03/26/2020   Procedure: BUBBLE STUDY;  Surgeon: Lars Masson, MD;  Location: [facility] ENDOSCOPY;  Service: Cardiovascular;;  . CATARACT EXTRACTION    . CHOLECYSTECTOMY  1972  . COLONOSCOPY    . EYE SURGERY     RIGHT CATARACT EXTRACTON WITH LENS IMPLANT  . GROWTH REMOVED FROM THYROID    . INTRAMEDULLARY (IM) NAIL INTERTROCHANTERIC Left 02/10/2018   Procedure: INTRAMEDULLARY (IM) NAIL INTERTROCHANTRIC;  Surgeon: Kathryne Hitch, MD;  Location: [facility] OR;  Service: Orthopedics;  Laterality: Left;  . INTRAMEDULLARY (IM) NAIL INTERTROCHANTERIC Right 08/20/2018   Procedure: ORIF RIGHT HIP INTERTROC FRACTURE;  Surgeon: Kathryne Hitch, MD;  Location: [facility] OR;  Service: Orthopedics;  Laterality: Right;  . IR CT HEAD LTD  03/24/2020  . IR PERCUTANEOUS ART THROMBECTOMY/INFUSION INTRACRANIAL INC DIAG ANGIO  03/24/2020  . left hip surgery   02/2018  . LOOP RECORDER INSERTION N/A 04/11/2020   Procedure: LOOP RECORDER INSERTION;  Surgeon: Regan Lemming, MD;  Location: [facility] INVASIVE CV LAB;  Service: Cardiovascular;  Laterality: N/A;  . ORIF HIP FRACTURE Right 08/20/2018  . PERCUTANEOUS NEPHROLITHOTOMY    . RADIOLOGY WITH ANESTHESIA N/A 03/23/2020   Procedure: IR WITH ANESTHESIA;  Surgeon: Julieanne Cotton, MD;  Location: [facility] OR;  Service: Radiology;  Laterality: N/A;  . TEE WITHOUT CARDIOVERSION N/A 03/26/2020   Procedure: TRANSESOPHAGEAL ECHOCARDIOGRAM (TEE);  Surgeon: Lars Masson, MD;  Location[facility] ENDOSCOPY;  Service: Cardiovascular;  Laterality: N/A;  . TOTAL KNEE ARTHROPLASTY  06/09/2012   Procedure: TOTAL KNEE ARTHROPLASTY;  Surgeon: Kathryne Hitch, MD;  Location: [facility] ORS;  Service: Orthopedics;  Laterality: Right;  Right Total Knee Arthroplasty  . TOTAL KNEE ARTHROPLASTY Left 04/09/2014   Procedure: LEFT TOTAL KNEE ARTHROPLASTY;  Surgeon: Kathryne Hitch, MD;  Location: [facility] OR;  Service: Orthopedics;  Laterality: Left;  . TUBAL LIGATION      Prior to Admission medications   Medication Sig Start Date End Date Taking? Authorizing Provider  acetaminophen (TYLENOL) 325 MG tablet Take 2 tablets (650 mg total) by mouth every 4 (four) hours as needed for mild pain (or temp > 37.5 C (99.5 F)). 04/11/20   Layne Benton, NP  amLODipine (NORVASC) 10 MG tablet Take 1 tablet (10 mg total) by mouth daily. 04/12/20   Layne Benton, NP  aspirin EC 81 MG tablet Take 81 mg by mouth daily.    [provider]  cefpodoxime (VANTIN) 100 MG tablet Take 1 tablet (100 mg total) by mouth 2 (two) times daily for 7 days. 08/18/20 08/25/20  Shaune Pollack, MD  ezetimibe (ZETIA) 10 MG tablet Take 1 tablet (10 mg total) by mouth daily. 04/12/20   Layne Benton, NP  insulin detemir (LEVEMIR) 100 UNIT/ML injection Inject 0.24 mLs (24 Units total) into the skin 2 (two) times daily. 05/05/20  Angiulli, Mcarthur Rossettianiel J, PA-C  levothyroxine (SYNTHROID) 125 MCG tablet Take 1 tablet (125 mcg total) by mouth daily. 11/19/19   Sonny Mastersakes, Linda M, FNP  metFORMIN (GLUCOPHAGE) 500 MG tablet Take 1 tablet (500 mg total) by mouth daily with breakfast. 05/06/20   Angiulli, Mcarthur Rossettianiel J, PA-C  metoprolol tartrate (LOPRESSOR) 25 MG tablet Take 1 tablet (25 mg total) by mouth 2 (two) times daily. 04/11/20   Layne BentonBiby, Sharon L, NP  Multiple Vitamin (MULTIVITAMIN WITH MINERALS) TABS tablet Take 1 tablet by mouth daily. 04/12/20   Layne BentonBiby, Sharon L, NP  pantoprazole (PROTONIX) 40 MG tablet Take 1 tablet (40 mg total) by mouth daily. 04/12/20   Layne BentonBiby, Sharon L, NP  pravastatin (PRAVACHOL) 40 MG tablet Take 1 tablet (40 mg total) by mouth daily at 6 PM. 04/11/20   Layne BentonBiby, Sharon L, NP  rOPINIRole (REQUIP) 0.25 MG tablet Take 1 tablet (0.25 mg total) by mouth 3 (three) times daily. 05/05/20   Angiulli, Mcarthur Rossettianiel J, PA-C  tiZANidine (ZANAFLEX) 2 MG tablet Take 0.5 tablets (1 mg total) by mouth 2 (two) times daily. 05/05/20    Angiulli, Mcarthur Rossettianiel J, PA-C    Allergies Semaglutide, Marcelline DeistFarxiga [dapagliflozin], and Penicillins  Family History  Problem Relation Age of Onset  . Heart disease Mother   . Breast cancer Mother        10860's  . Stroke Father   . Heart disease Father   . Healthy Daughter   . Healthy Son   . Liver disease Maternal Grandmother        gall stones imbedded in liver - blead to death when in surgery   . Pneumonia Maternal Grandfather   . Diabetes Paternal Grandmother     Social History Social History   Tobacco Use  . Smoking status: Never Smoker  . Smokeless tobacco: Never Used  Vaping Use  . Vaping Use: Never used  Substance Use Topics  . Alcohol use: No  . Drug use: No    Review of Systems  Review of Systems  Constitutional: Negative for fatigue and fever.  HENT: Negative for congestion and sore throat.   Eyes: Negative for visual disturbance.  Respiratory: Negative for cough and shortness of breath.   Cardiovascular: Negative for chest pain.  Gastrointestinal: Positive for abdominal pain. Negative for diarrhea, nausea and vomiting.  Genitourinary: Positive for dysuria. Negative for flank pain.  Musculoskeletal: Negative for back pain and neck pain.  Skin: Negative for rash and wound.  Neurological: Negative for weakness.  All other systems reviewed and are negative.    ____________________________________________  PHYSICAL EXAM:      VITAL SIGNS: ED Triage Vitals  Enc Vitals Group     BP 08/18/20 1233 90/61     Pulse Rate 08/18/20 1239 64     Resp 08/18/20 1233 18     Temp 08/18/20 1233 98 F (36.7 C)     Temp src --      SpO2 08/18/20 1233 99 %     Weight 08/18/20 1240 140 lb (63.5 kg)     Height 08/18/20 1240 5\' 7"  (1.702 m)     Head Circumference --      Peak Flow --      Pain Score 08/18/20 1239 10     Pain Loc --      Pain Edu? --      Excl. in GC? --      Physical Exam Vitals and nursing note reviewed.  Constitutional:      General:  She is not in  acute distress.    Appearance: She is well-developed.  HENT:     Head: Normocephalic and atraumatic.     Mouth/Throat:     Mouth: Mucous membranes are dry.  Eyes:     Conjunctiva/sclera: Conjunctivae normal.  Cardiovascular:     Rate and Rhythm: Normal rate and regular rhythm.     Heart sounds: Normal heart sounds. No murmur heard.  No friction rub.  Pulmonary:     Effort: Pulmonary effort is normal. No respiratory distress.     Breath sounds: Normal breath sounds. No wheezing or rales.  Abdominal:     General: There is no distension.     Palpations: Abdomen is soft.     Tenderness: There is no abdominal tenderness (mild).  Musculoskeletal:     Cervical back: Neck supple.  Skin:    General: Skin is warm.     Capillary Refill: Capillary refill takes less than 2 seconds.  Neurological:     Mental Status: She is alert and oriented to person, place, and time.     Motor: No abnormal muscle tone.     Comments: LUE, LLE weakness at baseline.       ____________________________________________   LABS (all labs ordered are listed, but only abnormal results are displayed)  Labs Reviewed  COMPREHENSIVE METABOLIC PANEL - Abnormal; Notable for the following components:      Result Value   Glucose, Bld 190 (*)    BUN 35 (*)    Creatinine, Ser 1.19 (*)    Albumin 3.3 (*)    GFR, Estimated 48 (*)    All other components within normal limits  URINALYSIS, COMPLETE (UACMP) WITH MICROSCOPIC - Abnormal; Notable for the following components:   Color, Urine AMBER (*)    APPearance CLOUDY (*)    Hgb urine dipstick MODERATE (*)    Ketones, ur 5 (*)    Protein, ur 30 (*)    Leukocytes,Ua LARGE (*)    Bacteria, UA MANY (*)    All other components within normal limits  URINE CULTURE  CBC WITH DIFFERENTIAL/PLATELET  LACTIC ACID, PLASMA  LACTIC ACID, PLASMA  TROPONIN I (HIGH SENSITIVITY)  TROPONIN I (HIGH SENSITIVITY)    ____________________________________________  EKG: Normal  sinus rhythm, ventricular rate 62.  PR 170, QRS 72, QTc 426.  No acute ST elevations or depressions.  No EKG evidence of acute ischemia or infarct. ________________________________________  RADIOLOGY All imaging, including plain films, CT scans, and ultrasounds, independently reviewed by me, and interpretations confirmed via formal radiology reads.  ED MD interpretation:     Official radiology report(s): No results found.  ____________________________________________  PROCEDURES   Procedure(s) performed (including Critical Care):  Procedures  ____________________________________________  INITIAL IMPRESSION / MDM / ASSESSMENT AND PLAN / ED COURSE  As part of my medical decision making, I reviewed the following data within the electronic MEDICAL RECORD NUMBER Nursing notes reviewed and incorporated, Old chart reviewed, Notes from prior ED visits, and [place] Controlled Substance Database       *Meredith Shaw was evaluated in Emergency Department on 08/18/2020 for the symptoms described in the history of present illness. She was evaluated in the context of the global COVID-19 pandemic, which necessitated consideration that the patient might be at risk for infection with the SARS-CoV-2 virus that causes COVID-19. Institutional protocols and algorithms that pertain to the evaluation of patients at risk for COVID-19 are in a state of rapid change based on information released by  regulatory bodies including the CDC and federal and state organizations. These policies and algorithms were followed during the patient's care in the ED.  Some ED evaluations and interventions may be delayed as a result of limited staffing during the pandemic.*     Medical Decision Making:  74 yo F here with hypotension noted at routine MD visit. Pt has been away from family at her SNF x 14 days 2/2 COVID exposure, and I suspect she is dehydrated as her family is often the primary reminder for her to eat/drink.  Clinically, she appears dry and UA shows ketonuria c/w dehydration. BUN/Cr ratio also slightly elevated. Otherwise, however, she appears very well without signs to suggest hypoperfusion or sepsis. WBC normal. LA normal. Her BP is markedly improved with 1L NS and she has been given Rocephin for possible UTI. At mental baseline per family. EKG non ischemic, doubt cardiac etiology. Pt tele shows NSR without arrhythmia.  Will encourage hydration, d/c with ABX.   ____________________________________________  FINAL CLINICAL IMPRESSION(S) / ED DIAGNOSES  Final diagnoses:  Dehydration  Acute cystitis without hematuria     MEDICATIONS GIVEN DURING THIS VISIT:  Medications  sodium chloride 0.9 % bolus 1,000 mL (0 mLs Intravenous Stopped 08/18/20 1541)  cefTRIAXone (ROCEPHIN) 2 g in sodium chloride 0.9 % 100 mL IVPB (0 g Intravenous Stopped 08/18/20 1528)     ED Discharge Orders         Ordered    cefpodoxime (VANTIN) 100 MG tablet  2 times daily,   Status:  Discontinued        08/18/20 1538    cefpodoxime (VANTIN) 100 MG tablet  2 times daily        08/18/20 1556           Note:  This document was prepared using Dragon voice recognition software and may include unintentional dictation errors.   Shaune Pollack, MD 08/18/20 2033

## 2020-08-18 NOTE — ED Notes (Signed)
Call attempt x1 for report no answer and lost connection

## 2020-08-18 NOTE — ED Notes (Signed)
report given to Joni Reining rn at [facility] all questions answered,

## 2020-08-19 ENCOUNTER — Ambulatory Visit: Payer: Medicare Other | Admitting: Physical Medicine & Rehabilitation

## 2020-08-20 ENCOUNTER — Encounter: Payer: Self-pay | Admitting: Urology

## 2020-08-20 ENCOUNTER — Ambulatory Visit (INDEPENDENT_AMBULATORY_CARE_PROVIDER_SITE_OTHER): Payer: Medicare Other | Admitting: Urology

## 2020-08-20 ENCOUNTER — Other Ambulatory Visit: Payer: Self-pay

## 2020-08-20 VITALS — BP 72/48 | HR 68 | Ht 67.0 in

## 2020-08-20 DIAGNOSIS — N319 Neuromuscular dysfunction of bladder, unspecified: Secondary | ICD-10-CM

## 2020-08-20 DIAGNOSIS — R339 Retention of urine, unspecified: Secondary | ICD-10-CM | POA: Diagnosis not present

## 2020-08-20 NOTE — Progress Notes (Signed)
08/20/2020 10:49 AM   Meredith Shaw Jun 09, 1946 [id number]  Referring provider: Bennie Pierini, FNP [address] [place],  [place] [address]  Chief Complaint  Patient presents with  . Urinary Retention    HPI: 74 y.o. female presents to establish local urologic care.   Previously followed [facility] for history of urolithiasis, recurrent UTI and most recently urinary retention and the last seen 05/19/2020  Presently residing at local SNF  CVA 03/23/2020 with urinary retention  Catheter removed prior to hospital discharge  Had a bladder volume 1.5 L in early August after Foley catheter placement  Catheter has been indwelling since August 2021  Presents today with daughter who states she is having increased bladder pain  Has been treated for multiple UTIs however unclear if she was having any significant symptoms  RUS 05/19/2020 with 2 nonobstructing left renal calculi, upper pole simple right renal cyst and 2 nonobstructing right renal calculi  PMH: Past Medical History:  Diagnosis Date  . Arthritis    OA AND PAIN BOTH KNEES -RIGHT KNEE HURTS WORSE THAN LEFT  . Cataract   . Diabetes mellitus    ORAL MEDICATION - NO INSULIN  . Hypertension   . Hypertensive retinopathy    OU  . Hypothyroidism   . Kidney stones    NONE AT PRESENT TIME THAT PT IS AWARE OF  . Osteoporosis     Surgical History: Past Surgical History:  Procedure Laterality Date  . ABDOMINAL HYSTERECTOMY    . BUBBLE STUDY  03/26/2020   Procedure: BUBBLE STUDY;  Surgeon: Lars Masson, MD;  Location: [facility] ENDOSCOPY;  Service: Cardiovascular;;  . CATARACT EXTRACTION    . CHOLECYSTECTOMY  1972  . COLONOSCOPY    . EYE SURGERY     RIGHT CATARACT EXTRACTON WITH LENS IMPLANT  . GROWTH REMOVED FROM THYROID    . INTRAMEDULLARY (IM) NAIL INTERTROCHANTERIC Left 02/10/2018   Procedure: INTRAMEDULLARY (IM) NAIL INTERTROCHANTRIC;  Surgeon: Kathryne Hitch, MD;  Location: [facility] OR;   Service: Orthopedics;  Laterality: Left;  . INTRAMEDULLARY (IM) NAIL INTERTROCHANTERIC Right 08/20/2018   Procedure: ORIF RIGHT HIP INTERTROC FRACTURE;  Surgeon: Kathryne Hitch, MD;  Location: [facility] OR;  Service: Orthopedics;  Laterality: Right;  . IR CT HEAD LTD  03/24/2020  . IR PERCUTANEOUS ART THROMBECTOMY/INFUSION INTRACRANIAL INC DIAG ANGIO  03/24/2020  . left hip surgery   02/2018  . LOOP RECORDER INSERTION N/A 04/11/2020   Procedure: LOOP RECORDER INSERTION;  Surgeon: Regan Lemming, MD;  [facility]: [facility] INVASIVE CV LAB;  Service: Cardiovascular;  Laterality: N/A;  . ORIF HIP FRACTURE Right 08/20/2018  . PERCUTANEOUS NEPHROLITHOTOMY    . RADIOLOGY WITH ANESTHESIA N/A 03/23/2020   Procedure: IR WITH ANESTHESIA;  Surgeon: Julieanne Cotton, MD;  Location: [facility] OR;  Service: Radiology;  Laterality: N/A;  . TEE WITHOUT CARDIOVERSION N/A 03/26/2020   Procedure: TRANSESOPHAGEAL ECHOCARDIOGRAM (TEE);  Surgeon: Lars Masson, MD;  Location[facility] ENDOSCOPY;  Service: Cardiovascular;  Laterality: N/A;  . TOTAL KNEE ARTHROPLASTY  06/09/2012   Procedure: TOTAL KNEE ARTHROPLASTY;  Surgeon: Kathryne Hitch, MD;  Location: [facility] ORS;  Service: Orthopedics;  Laterality: Right;  Right Total Knee Arthroplasty  . TOTAL KNEE ARTHROPLASTY Left 04/09/2014   Procedure: LEFT TOTAL KNEE ARTHROPLASTY;  Surgeon: Kathryne Hitch, MD;  Location: [facility] OR;  Service: Orthopedics;  Laterality: Left;  . TUBAL LIGATION      Home Medications:  Allergies as of 08/20/2020      Reactions  Semaglutide Nausea And Vomiting   PO Rybelsus   Farxiga [dapagliflozin]    Yeast infections   Penicillins Itching, Rash   Has tolerated Cefazlin and Ceftriaxone in past       Medication List       Accurate as of August 20, 2020 10:49 AM. If you have any questions, ask your nurse or doctor.        acetaminophen 325 MG tablet Commonly known as: TYLENOL Take 2 tablets (650 mg total) by mouth every 4 (four)  hours as needed for mild pain (or temp > 37.5 C (99.5 F)).   amLODipine 10 MG tablet Commonly known as: NORVASC Take 1 tablet (10 mg total) by mouth daily.   aspirin EC 81 MG tablet Take 81 mg by mouth daily.   cefpodoxime 100 MG tablet Commonly known as: VANTIN Take 1 tablet (100 mg total) by mouth 2 (two) times daily for 7 days.   ezetimibe 10 MG tablet Commonly known as: ZETIA Take 1 tablet (10 mg total) by mouth daily.   gabapentin 100 MG capsule Commonly known as: NEURONTIN Take by mouth.   insulin detemir 100 UNIT/ML injection Commonly known as: LEVEMIR Inject 0.24 mLs (24 Units total) into the skin 2 (two) times daily.   levothyroxine 125 MCG tablet Commonly known as: SYNTHROID Take 1 tablet (125 mcg total) by mouth daily.   metFORMIN 500 MG tablet Commonly known as: GLUCOPHAGE Take 1 tablet (500 mg total) by mouth daily with breakfast.   metoprolol tartrate 25 MG tablet Commonly known as: LOPRESSOR Take 1 tablet (25 mg total) by mouth 2 (two) times daily.   multivitamin with minerals Tabs tablet Take 1 tablet by mouth daily.   pantoprazole 40 MG tablet Commonly known as: PROTONIX Take 1 tablet (40 mg total) by mouth daily.   pravastatin 40 MG tablet Commonly known as: PRAVACHOL Take 1 tablet (40 mg total) by mouth daily at 6 PM.   rOPINIRole 0.25 MG tablet Commonly known as: REQUIP Take 1 tablet (0.25 mg total) by mouth 3 (three) times daily.   tiZANidine 2 MG tablet Commonly known as: ZANAFLEX Take 0.5 tablets (1 mg total) by mouth 2 (two) times daily.       Allergies:  Allergies  Allergen Reactions  . Semaglutide Nausea And Vomiting    PO Rybelsus  . Farxiga [Dapagliflozin]     Yeast infections  . Penicillins Itching and Rash    Has tolerated Cefazlin and Ceftriaxone in past     Family History: Family History  Problem Relation Age of Onset  . Heart disease Mother   . Breast cancer Mother        50's  . Stroke Father   . Heart  disease Father   . Healthy Daughter   . Healthy Son   . Liver disease Maternal Grandmother        gall stones imbedded in liver - blead to death when in surgery   . Pneumonia Maternal Grandfather   . Diabetes Paternal Grandmother     Social History:  reports that she has never smoked. She has never used smokeless tobacco. She reports that she does not drink alcohol and does not use drugs.   Physical Exam: BP (!) 72/48   Pulse 68   Ht 5\' 7"  (1.702 m)   BMI 21.93 kg/m   Constitutional:  Alert, anxious vocalizing for someone to help her. HEENT: [place] AT, moist mucus membranes.  Trachea midline, no masses. Cardiovascular: No clubbing, cyanosis,  or edema. Respiratory: Normal respiratory effort, no increased work of breathing. GI: Abdomen is soft, nontender, nondistended, no abdominal masses GU: Foley catheter draining clear urine   Assessment & Plan:    1.  Urinary retention  Most likely neurogenic bladder secondary to CVA however she may now have NDO  No recent voiding trials  Significant bladder pain secondary indwelling Foley  Daughter would like a voiding trial and orders written for catheter to be removed.  Order was written for SNF to replace Foley catheter if unable to void  If she is successful voiding follow-up appointment scheduled for bladder scan PVR   Riki Altes, MD  [facility] [address] [place], [place] [address] [phone]

## 2020-08-21 ENCOUNTER — Encounter: Payer: Self-pay | Admitting: Urology

## 2020-08-21 LAB — URINE CULTURE: Culture: >=100000 — AB

## 2020-08-22 ENCOUNTER — Other Ambulatory Visit: Payer: Self-pay

## 2020-08-22 ENCOUNTER — Ambulatory Visit (INDEPENDENT_AMBULATORY_CARE_PROVIDER_SITE_OTHER): Payer: Medicare Other | Admitting: Physician Assistant

## 2020-08-22 ENCOUNTER — Encounter: Payer: Self-pay | Admitting: Physician Assistant

## 2020-08-22 VITALS — BP 110/76 | HR 68

## 2020-08-22 DIAGNOSIS — R8279 Other abnormal findings on microbiological examination of urine: Secondary | ICD-10-CM | POA: Diagnosis not present

## 2020-08-22 DIAGNOSIS — R339 Retention of urine, unspecified: Secondary | ICD-10-CM | POA: Diagnosis not present

## 2020-08-22 LAB — CUP PACEART REMOTE DEVICE CHECK
Date Time Interrogation Session: 20211111135519
Implantable Pulse Generator Implant Date: 20210802

## 2020-08-22 LAB — BLADDER SCAN AMB NON-IMAGING

## 2020-08-22 NOTE — Progress Notes (Signed)
08/22/2020 11:56 AM   [name] Mar 11, 1946 [id number]  CC: Chief Complaint  Patient presents with  . Urinary Retention   HPI: Meredith Shaw is a 74 y.o. 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  She was seen in clinic most recently by Dr. Lonna Cobb 2 days ago to establish urologic care.  She is accompanied today by her daughter, who contributes to HPI.  Today she reports Foley catheter was removed 2 days ago at her nursing facility.  She has been able to urinate since and feels she is emptying appropriately.  PVR 147 mL.    She is currently taking Cefpodoxime prescribed by the emergency department 4 days ago for management of a possible UTI.  Urine culture has resulted with Cipro resistant E. coli and Enterococcus faecalis.  She denies dysuria, abdominal pain, low back pain, fever, chills, nausea, and vomiting.    PMH: Past Medical History:  Diagnosis Date  . Arthritis    OA AND PAIN BOTH KNEES -RIGHT KNEE HURTS WORSE THAN LEFT  . Cataract   . Diabetes mellitus    ORAL MEDICATION - NO INSULIN  . Hypertension   . Hypertensive retinopathy    OU  . Hypothyroidism   . Kidney stones    NONE AT PRESENT TIME THAT PT IS AWARE OF  . Osteoporosis     Surgical History: Past Surgical History:  Procedure Laterality Date  . ABDOMINAL HYSTERECTOMY    . BUBBLE STUDY  03/26/2020   Procedure: BUBBLE STUDY;  Surgeon: Lars Masson, MD;  Location: [facility] ENDOSCOPY;  Service: Cardiovascular;;  . CATARACT EXTRACTION    . CHOLECYSTECTOMY  1972  . COLONOSCOPY    . EYE SURGERY     RIGHT CATARACT EXTRACTON WITH LENS IMPLANT  . GROWTH REMOVED FROM THYROID    . INTRAMEDULLARY (IM) NAIL INTERTROCHANTERIC Left 02/10/2018   Procedure: INTRAMEDULLARY (IM) NAIL INTERTROCHANTRIC;  Surgeon: Kathryne Hitch, MD;  Location: [facility] OR;  Service:  Orthopedics;  Laterality: Left;  . INTRAMEDULLARY (IM) NAIL INTERTROCHANTERIC Right 08/20/2018   Procedure: ORIF RIGHT HIP INTERTROC FRACTURE;  Surgeon: Kathryne Hitch, MD;  Location: [facility] OR;  Service: Orthopedics;  Laterality: Right;  . IR CT HEAD LTD  03/24/2020  . IR PERCUTANEOUS ART THROMBECTOMY/INFUSION INTRACRANIAL INC DIAG ANGIO  03/24/2020  . left hip surgery   02/2018  . LOOP RECORDER INSERTION N/A 04/11/2020   Procedure: LOOP RECORDER INSERTION;  Surgeon: Regan Lemming, MD;  Location: [facility] INVASIVE CV LAB;  Service: Cardiovascular;  Laterality: N/A;  . ORIF HIP FRACTURE Right 08/20/2018  . PERCUTANEOUS NEPHROLITHOTOMY    . RADIOLOGY WITH ANESTHESIA N/A 03/23/2020   Procedure: IR WITH ANESTHESIA;  Surgeon: Julieanne Cotton, MD;  Location: [facility] OR;  Service: Radiology;  Laterality: N/A;  . TEE WITHOUT CARDIOVERSION N/A 03/26/2020   Procedure: TRANSESOPHAGEAL ECHOCARDIOGRAM (TEE);  Surgeon: Lars Masson, MD;  Location: [facility] ENDOSCOPY;  Service: Cardiovascular;  Laterality: N/A;  . TOTAL KNEE ARTHROPLASTY  06/09/2012   Procedure: TOTAL KNEE ARTHROPLASTY;  Surgeon: Kathryne Hitch, MD;  Location: [facility] ORS;  Service: Orthopedics;  Laterality: Right;  Right Total Knee Arthroplasty  . TOTAL KNEE ARTHROPLASTY Left 04/09/2014   Procedure: LEFT TOTAL KNEE ARTHROPLASTY;  Surgeon: Kathryne Hitch, MD;  Location: [facility] OR;  Service: Orthopedics;  Laterality: Left;  . TUBAL LIGATION  Home Medications:  Allergies as of 08/22/2020      Reactions   Semaglutide Nausea And Vomiting   PO Rybelsus   Farxiga [dapagliflozin]    Yeast infections   Penicillins Itching, Rash   Has tolerated Cefazlin and Ceftriaxone in past       Medication List       Accurate as of August 22, 2020 11:56 AM. If you have any questions, ask your nurse or doctor.        acetaminophen 325 MG tablet Commonly known as: TYLENOL Take 2 tablets (650 mg total) by mouth every 4 (four) hours as  needed for mild pain (or temp > 37.5 C (99.5 F)).   amLODipine 10 MG tablet Commonly known as: NORVASC Take 1 tablet (10 mg total) by mouth daily.   aspirin EC 81 MG tablet Take 81 mg by mouth daily.   cefpodoxime 100 MG tablet Commonly known as: VANTIN Take 1 tablet (100 mg total) by mouth 2 (two) times daily for 7 days.   ezetimibe 10 MG tablet Commonly known as: ZETIA Take 1 tablet (10 mg total) by mouth daily.   gabapentin 100 MG capsule Commonly known as: NEURONTIN Take by mouth.   insulin detemir 100 UNIT/ML injection Commonly known as: LEVEMIR Inject 0.24 mLs (24 Units total) into the skin 2 (two) times daily.   levothyroxine 125 MCG tablet Commonly known as: SYNTHROID Take 1 tablet (125 mcg total) by mouth daily.   metFORMIN 500 MG tablet Commonly known as: GLUCOPHAGE Take 1 tablet (500 mg total) by mouth daily with breakfast.   metoprolol tartrate 25 MG tablet Commonly known as: LOPRESSOR Take 1 tablet (25 mg total) by mouth 2 (two) times daily.   multivitamin with minerals Tabs tablet Take 1 tablet by mouth daily.   pantoprazole 40 MG tablet Commonly known as: PROTONIX Take 1 tablet (40 mg total) by mouth daily.   pravastatin 40 MG tablet Commonly known as: PRAVACHOL Take 1 tablet (40 mg total) by mouth daily at 6 PM.   rOPINIRole 0.25 MG tablet Commonly known as: REQUIP Take 1 tablet (0.25 mg total) by mouth 3 (three) times daily.   tiZANidine 2 MG tablet Commonly known as: ZANAFLEX Take 0.5 tablets (1 mg total) by mouth 2 (two) times daily.       Allergies:  Allergies  Allergen Reactions  . Semaglutide Nausea And Vomiting    PO Rybelsus  . Farxiga [Dapagliflozin]     Yeast infections  . Penicillins Itching and Rash    Has tolerated Cefazlin and Ceftriaxone in past     Family History: Family History  Problem Relation Age of Onset  . Heart disease Mother   . Breast cancer Mother        5's  . Stroke Father   . Heart disease  Father   . Healthy Daughter   . Healthy Son   . Liver disease Maternal Grandmother        gall stones imbedded in liver - blead to death when in surgery   . Pneumonia Maternal Grandfather   . Diabetes Paternal Grandmother     Social History:   reports that she has never smoked. She has never used smokeless tobacco. She reports that she does not drink alcohol and does not use drugs.  Physical Exam: BP 110/76   Pulse 68   Constitutional:  Alert, no acute distress, nontoxic appearing HEENT: [place], AT Cardiovascular: No clubbing, cyanosis, or edema Respiratory: Normal respiratory effort, no increased  work of breathing Skin: No rashes, bruises or suspicious lesions Neurologic: Repetitive, involuntary movements of the right extremities; requires total assist for transfer Psychiatric: Normal mood and affect  Laboratory Data: Results for orders placed or performed in visit on 08/22/20  Bladder Scan (Post Void Residual) in office  Result Value Ref Range   Scan Result    Assessment & Plan:   1. Urinary retention Voiding trial passed today with appropriate PVR.  We will plan for symptom recheck with repeat PVR in 6 weeks.  Provided note for nursing staff to replace Foley with recurrent retention with PVR greater than 400 mL, lower abdominal pain, or abdominal distention. - Bladder Scan (Post Void Residual) in office  2. Positive urine culture Unclear if this represents true UTI versus urinary colonization in the setting of chronic Foley.  Prescribed Cefpodoxime not expected to cover against E faecalis, however patient is asymptomatic of infection today.  I obtained a catheterized urine sample for repeat culture today.  Counseled patient and daughter to contact our office if she develops infective symptoms including dysuria, urgency, frequency, lower abdominal pain, low back pain, fever, chills, nausea, or vomiting.  Will defer therapy pending development of infective symptoms. - CULTURE,  URINE COMPREHENSIVE   Return in about 6 weeks (around 10/03/2020) for Symptom recheck with PVR.  Carman Ching, PA-C  [facility] [address] [place], [place] [address] [phone]

## 2020-08-22 NOTE — Progress Notes (Signed)
ED Antimicrobial Stewardship Positive Culture Follow Up   Meredith Shaw is an 74 y.o. female who presented to [facility] on 08/18/2020 with a chief complaint of hypotension. Patient has chronic foley present prior to admission - expect to see bacteriuria and pyuria. Multiple organisms in urine culture consistent with colonization. Patient also saw urologist who did not mention concern for UTI. Discussed with ED physician who agreed no changes needed.    Chief Complaint  Patient presents with  . Hypotension    Recent Results (from the past 720 hour(s))  Urine culture     Status: Abnormal   Collection Time: 08/18/20  1:48 PM   Specimen: Urine, Random  Result Value Ref Range Status   Specimen Description   Final    URINE, RANDOM Performed at [facility], [address], [place], [place] [address]    Special Requests   Final    NONE Performed at [facility], [address] [place], [place] [address]    Culture (A)  Final    >=100,000 COLONIES/mL ESCHERICHIA COLI 80,000 COLONIES/mL ENTEROCOCCUS FAECALIS    Report Status 08/21/2020 FINAL  Final   Organism ID, Bacteria ESCHERICHIA COLI (A)  Final   Organism ID, Bacteria ENTEROCOCCUS FAECALIS (A)  Final      Susceptibility   Escherichia coli - MIC*    AMPICILLIN 4 SENSITIVE Sensitive     CEFAZOLIN <=4 SENSITIVE Sensitive     CEFEPIME <=0.12 SENSITIVE Sensitive     CEFTRIAXONE <=0.25 SENSITIVE Sensitive     CIPROFLOXACIN >=4 RESISTANT Resistant     GENTAMICIN <=1 SENSITIVE Sensitive     IMIPENEM <=0.25 SENSITIVE Sensitive     NITROFURANTOIN <=16 SENSITIVE Sensitive     TRIMETH/SULFA <=20 SENSITIVE Sensitive     AMPICILLIN/SULBACTAM <=2 SENSITIVE Sensitive     PIP/TAZO <=4 SENSITIVE Sensitive     * >=100,000 COLONIES/mL ESCHERICHIA COLI   Enterococcus faecalis - MIC*    AMPICILLIN <=2 SENSITIVE Sensitive     NITROFURANTOIN <=16 SENSITIVE Sensitive     VANCOMYCIN 1 SENSITIVE Sensitive     * 80,000  COLONIES/mL ENTEROCOCCUS FAECALIS    [x]  Treated with Cefpodoxime - E coli sensitive. [employer] does not cover enterococcus. See assessment - ED provider agreed no additional coverage needed.  ED Provider: Dr. , PharmD Pharmacy Resident  08/22/2020 9:37 AM

## 2020-08-25 ENCOUNTER — Ambulatory Visit (INDEPENDENT_AMBULATORY_CARE_PROVIDER_SITE_OTHER): Payer: Medicare Other

## 2020-08-25 ENCOUNTER — Telehealth: Payer: Self-pay

## 2020-08-25 DIAGNOSIS — I480 Paroxysmal atrial fibrillation: Secondary | ICD-10-CM

## 2020-08-25 DIAGNOSIS — I63511 Cerebral infarction due to unspecified occlusion or stenosis of right middle cerebral artery: Secondary | ICD-10-CM | POA: Diagnosis not present

## 2020-08-25 HISTORY — DX: Paroxysmal atrial fibrillation: I48.0

## 2020-08-25 LAB — CULTURE, URINE COMPREHENSIVE

## 2020-08-25 NOTE — Telephone Encounter (Signed)
ILR alert received for AF 4 mins, ECG appears AF w/ average V rate 140 bpm's.   Spoke to daughter [name], [place]), per daughter. States patient lives at nursing facility ([facility]) and has dementia. States patient does not have a phone. Requested to call [name] to set up apt. Advised Dr. Lalla Brothers would like for patient to follow up in AF clinic for evaluation. No OAC on file.  [name] [phone]

## 2020-08-25 NOTE — Telephone Encounter (Signed)
Called and spoke with daughter, [name], she requested appt for next week d/t transportation at [facility] and other appts pt already has this week.

## 2020-08-26 ENCOUNTER — Telehealth: Payer: Self-pay | Admitting: Physician Assistant

## 2020-08-26 NOTE — Telephone Encounter (Signed)
Patients daughter, [name], reports no new urinary symptoms present. She notes improvement in patients urinary issues since removing the foley catheter. Judy Pimple with no new urinary symptoms present, no need to treat. [name] expressed understanding.

## 2020-08-26 NOTE — Progress Notes (Signed)
Carelink Summary Report / Loop Recorder 

## 2020-08-26 NOTE — Telephone Encounter (Signed)
Please contact the patient's daughter and inquire if the patient is having any infective symptoms including dysuria, urgency, frequency, lower abdominal pain, low back pain, fever, chills, nausea, or vomiting.  Please explain that her mother's repeat urine culture did not grow the same bacteria as before, so I suspect her prescribed Cefpodoxime is working.  However, it did grow yeast, which is a common contaminant.  If she is not having any new infective symptoms, we do not need to treat this.

## 2020-08-28 ENCOUNTER — Other Ambulatory Visit: Payer: Self-pay

## 2020-08-28 ENCOUNTER — Encounter: Payer: Self-pay | Admitting: Physical Medicine & Rehabilitation

## 2020-08-28 ENCOUNTER — Encounter: Payer: Medicare Other | Attending: Physical Medicine & Rehabilitation | Admitting: Physical Medicine & Rehabilitation

## 2020-08-28 VITALS — BP 106/75 | HR 80 | Temp 98.1°F

## 2020-08-28 DIAGNOSIS — G8110 Spastic hemiplegia affecting unspecified side: Secondary | ICD-10-CM | POA: Diagnosis present

## 2020-08-28 NOTE — Progress Notes (Signed)
3

## 2020-08-28 NOTE — Progress Notes (Signed)
Subjective:    Patient ID: Meredith Shaw, female    DOB: 09-22-1946, 74 y.o.   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 At CIR Admit date: 04/11/2020 Discharge date: 05/05/2020  HPI  Still in SNF Botox 6 wks ago Left pectoralis 50 units Left biceps 200 units Left brachioradialis 50 units  Pain Inventory Average Pain 0 Pain Right Now 0 My pain is tingling  LOCATION OF PAIN  Shoulder elbow wrist hand fingers  BOWEL Number of stools per week: 7 Oral laxative use Yes  Type of laxative miralx and dulcolax Enema or suppository use No  History of colostomy No  Incontinent No   BLADDER Normal In and out cath, frequency  Able to self cath No  Bladder incontinence No  Frequent urination No  Leakage with coughing No  Difficulty starting stream No  Incomplete bladder emptying No    Mobility use a wheelchair needs help with transfers  Function disabled: date disabled . retired I need assistance with the following:  feeding, dressing, bathing, toileting, meal prep, household duties and shopping  Neuro/Psych tingling confusion  Prior Studies Any changes since last visit?  no  Physicians involved in your care Any changes since last visit?  no   Family History  Problem Relation Age of Onset   Heart disease Mother    Breast cancer Mother        20's   Stroke Father    Heart disease Father    Healthy Daughter    Healthy Son    Liver disease Maternal Grandmother        gall stones imbedded in liver - blead to death when in surgery    Pneumonia Maternal Grandfather    Diabetes Paternal Grandmother    Social History   Socioeconomic History   Marital status: Married    Spouse name: Earvin Hansen    Number of children: 2   Years of education: 15   Highest education level: Some college, no degree  Occupational History   Occupation: retired     Associate Professor: [employer]    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    Comment: bank - 68yrs  Tobacco  Use   Smoking status: Never Smoker   Smokeless tobacco: Never Used  Building services engineer Use: Never used  Substance and Sexual Activity   Alcohol use: No   Drug use: No   Sexual activity: Yes    Birth control/protection: Surgical  Other Topics Concern   Not on file  Social History Narrative   Not on file   Social Determinants of Health   Financial Resource Strain:    Difficulty of Paying Living Expenses: Not on file  Food Insecurity:    Worried About Programme researcher, broadcasting/film/video in the Last Year: Not on file   [employer] of Food in the Last Year: Not on file  Transportation Needs:    Lack of Transportation (Medical): Not on file   Lack of Transportation (Non-Medical): Not on file  Physical Activity:    Days of Exercise per Week: Not on file   Minutes of Exercise per Session: Not on file  Stress:    Feeling of Stress : Not on file  Social Connections:    Frequency of Communication with Friends and Family: Not on file   Frequency of Social Gatherings with Friends and Family: Not on file   Attends Religious Services: Not on file   Active Member of Clubs or Organizations: Not on file  Attends Banker Meetings: Not on file   Marital Status: Not on file   Past Surgical History:  Procedure Laterality Date   ABDOMINAL HYSTERECTOMY     BUBBLE STUDY  03/26/2020   Procedure: BUBBLE STUDY;  Surgeon: Lars Masson, MD;  Location: [facility] ENDOSCOPY;  Service: Cardiovascular;;   CATARACT EXTRACTION     CHOLECYSTECTOMY  1972   COLONOSCOPY     EYE SURGERY     RIGHT CATARACT EXTRACTON WITH LENS IMPLANT   GROWTH REMOVED FROM THYROID     INTRAMEDULLARY (IM) NAIL INTERTROCHANTERIC Left 02/10/2018   Procedure: INTRAMEDULLARY (IM) NAIL INTERTROCHANTRIC;  Surgeon: Kathryne Hitch, MD;  Location: [facility] OR;  Service: Orthopedics;  Laterality: Left;   INTRAMEDULLARY (IM) NAIL INTERTROCHANTERIC Right 08/20/2018   Procedure: ORIF RIGHT HIP INTERTROC FRACTURE;   Surgeon: Kathryne Hitch, MD;  Location: [facility] OR;  Service: Orthopedics;  Laterality: Right;   IR CT HEAD LTD  03/24/2020   IR PERCUTANEOUS ART THROMBECTOMY/INFUSION INTRACRANIAL INC DIAG ANGIO  03/24/2020   left hip surgery   02/2018   LOOP RECORDER INSERTION N/A 04/11/2020   Procedure: LOOP RECORDER INSERTION;  Surgeon: Regan Lemming, MD;  Location: [facility] INVASIVE CV LAB;  Service: Cardiovascular;  Laterality: N/A;   ORIF HIP FRACTURE Right 08/20/2018   PERCUTANEOUS NEPHROLITHOTOMY     RADIOLOGY WITH ANESTHESIA N/A 03/23/2020   Procedure: IR WITH ANESTHESIA;  Surgeon: Julieanne Cotton, MD;  Location: [facility] OR;  Service: Radiology;  Laterality: N/A;   TEE WITHOUT CARDIOVERSION N/A 03/26/2020   Procedure: TRANSESOPHAGEAL ECHOCARDIOGRAM (TEE);  Surgeon: Lars Masson, MD;  Location: [facility] ENDOSCOPY;  Service: Cardiovascular;  Laterality: N/A;   TOTAL KNEE ARTHROPLASTY  06/09/2012   Procedure: TOTAL KNEE ARTHROPLASTY;  Surgeon: Kathryne Hitch, MD;  Location: [facility] ORS;  Service: Orthopedics;  Laterality: Right;  Right Total Knee Arthroplasty   TOTAL KNEE ARTHROPLASTY Left 04/09/2014   Procedure: LEFT TOTAL KNEE ARTHROPLASTY;  Surgeon: Kathryne Hitch, MD;  Location: [facility] OR;  Service: Orthopedics;  Laterality: Left;   TUBAL LIGATION     Past Medical History:  Diagnosis Date   Arthritis    OA AND PAIN BOTH KNEES -RIGHT KNEE HURTS WORSE THAN LEFT   Cataract    Diabetes mellitus    ORAL MEDICATION - NO INSULIN   Hypertension    Hypertensive retinopathy    OU   Hypothyroidism    Kidney stones    NONE AT PRESENT TIME THAT PT IS AWARE OF   Osteoporosis    BP 106/75    Pulse 80    Temp 98.1 F (36.7 C)    SpO2 97%   Opioid Risk Score:   Fall Risk Score:  `1  Depression screen PHQ 2/9  Depression screen [facility] 2/9 08/28/2020 06/24/2020 02/21/2020 11/15/2019 08/16/2019 08/15/2019 05/14/2019  Decreased Interest 2 2 0 0 0 0 0  Down, Depressed, Hopeless 1 1 0 0 0 0 0   PHQ - 2 Score 3 3 0 0 0 0 0  Altered sleeping - 1 - - - - -  Tired, decreased energy - 2 - - - - -  Change in appetite - 0 - - - - -  Feeling bad or failure about yourself  - 0 - - - - -  Trouble concentrating - 1 - - - - -  Moving slowly or fidgety/restless - 1 - - - - -  Suicidal thoughts - 0 - - - - -  PHQ-9 Score -  8 - - - - -  Difficult doing work/chores - Not difficult at all - - - - -  Some recent data might be hidden   Review of Systems  Constitutional: Negative.   HENT: Negative.   Eyes: Negative.   Respiratory: Negative.   Cardiovascular:       Low blood pressures at times  Gastrointestinal: Negative.   Endocrine: Negative.   Genitourinary: Negative.   Musculoskeletal: Positive for gait problem.  Skin: Negative.   Allergic/Immunologic: Negative.   Neurological:       Tingling  Hematological: Negative.   Psychiatric/Behavioral: Positive for confusion.  All other systems reviewed and are negative.      Objective:   Physical Exam Vitals and nursing note reviewed.  Constitutional:      Appearance: She is normal weight.  HENT:     Head: Normocephalic and atraumatic.  Eyes:     Extraocular Movements: Extraocular movements intact.     Conjunctiva/sclera: Conjunctivae normal.     Pupils: Pupils are equal, round, and reactive to light.  Neurological:     Mental Status: She is alert.     Sensory: Sensory deficit present.     Motor: Abnormal muscle tone present.     Coordination: Coordination abnormal.     Gait: Gait abnormal.     Comments: Hypersensitivity to touch left hand as well as movement allodynia left shoulder.  Nonambulatory  Psychiatric:        Attention and Perception: She is inattentive.        Mood and Affect: Affect is flat and inappropriate.        Behavior: Behavior is slowed and withdrawn.        Cognition and Memory: Cognition is impaired.   No swelling in the left hand, no joint effusion in the wrist.  No pain with elbow range of  motion.        Assessment & Plan:  #1.  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 She responded well to the Botox at the elbow flexors the pectoralis is slightly looser than before but still has some pain with range of motion. Recommend repeating Botox in 6 weeks Left biceps 200 units Left brachioradialis 50 units Left pectoralis 150 units 2.  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

## 2020-08-28 NOTE — Patient Instructions (Signed)
Will restart Gabapentin for hypersensitivity

## 2020-08-29 ENCOUNTER — Non-Acute Institutional Stay: Payer: Medicare Other | Admitting: Primary Care

## 2020-08-29 DIAGNOSIS — E1165 Type 2 diabetes mellitus with hyperglycemia: Secondary | ICD-10-CM

## 2020-08-29 DIAGNOSIS — G8110 Spastic hemiplegia affecting unspecified side: Secondary | ICD-10-CM

## 2020-08-29 DIAGNOSIS — M792 Neuralgia and neuritis, unspecified: Secondary | ICD-10-CM

## 2020-08-29 DIAGNOSIS — G8194 Hemiplegia, unspecified affecting left nondominant side: Secondary | ICD-10-CM

## 2020-08-29 DIAGNOSIS — I69398 Other sequelae of cerebral infarction: Secondary | ICD-10-CM

## 2020-08-29 DIAGNOSIS — I69391 Dysphagia following cerebral infarction: Secondary | ICD-10-CM

## 2020-08-29 DIAGNOSIS — Z515 Encounter for palliative care: Secondary | ICD-10-CM

## 2020-08-29 DIAGNOSIS — E1122 Type 2 diabetes mellitus with diabetic chronic kidney disease: Secondary | ICD-10-CM

## 2020-08-29 DIAGNOSIS — N183 Chronic kidney disease, stage 3 unspecified: Secondary | ICD-10-CM

## 2020-08-29 NOTE — Progress Notes (Signed)
[facility] Palliative Care Consult Note Telephone: [phone]  Fax: [phone]     Date of encounter: 08/29/20 PATIENT [name] [place] [id number] [phone] (home)  DOB: 04-30-1946 MRN: [id number]  PRIMARY CARE PROVIDER:    Rica Koyanagi, MD,  [place] [address] [phone]  REFERRING PROVIDER:   Rica Koyanagi, MD [address] [place],  [place] [address] [phone]  RESPONSIBLE PARTY:   Extended Emergency Contact Information Primary Emergency Contact: Shaw,Meredith Address: [address]          [place], [place] [address] Meredith Shaw of [place] Phone: [phone] Relation: Daughter Secondary Emergency Contact: Meredith Shaw Address: [address]          [place], [place] [address] [place] Phone: [phone] Mobile Phone: [phone] Relation: Spouse  I met face to face with patient and family in  facility. Palliative Care was asked to follow this patient by consultation request of Rica Koyanagi, MD to help address advance care planning and goals of care. This is the  initial visit.   ASSESSMENT AND RECOMMENDATIONS:   1. Advance Care Planning/Goals of Care: Goals include to maximize quality of life and symptom management. Our advance care planning conversation included a discussion about:     The value and importance of advance care planning   Experiences with loved ones who have been seriously ill or have died   Exploration of personal, cultural or spiritual beliefs that might influence medical decisions   Exploration of goals of care in the event of a sudden injury or illness   Identification and preparation of a healthcare agent - daugher  Review of an  advance directive document .  Telephone conversation with POA who is daughter. We discussed patient's history and course of disease, starting with a stroke in the summer. She has  declined since then and now is in LTC. Patient and husband have been married many years and the separation is very stressful for them.  We discussed concept of de escalation of care and DNR. Family feels  This is not yet indicated as pt is interactive and enjoys their company. Meredith Shaw was in SNF room on my arrival and we also met and chatted. He had an appointment to keep so left soon after. He and daughter will talk about our conversation earlier today.  2. Symptom Management:   BG and intake: appear not well managed given her recent BG levels. Food choices reviewed with patient. Weights are also declining. She was 160 on admission in 7.21 and now is 143 lbs.his is an 11 % drop in 4 months which is concerning.  Recommend glucerna or low carbohydrate supplements and protein supplements. Recommend sliding scale meal time insuline as well.  Pain: Daughter endorsed patient pain likely allodynia and L arm contracture. Patient denies during visit. Continue to monitor and treat proactively if needed. May benefit from increase in gabapentin.Receiving botox injections for pain and contracture in L arm and shoulder.  3. Follow up Palliative Care Visit: Palliative care will continue to follow for goals of care clarification and symptom management. Return 3 weeks or prn.  4. Family /Caregiver/Community Supports: Husband lives 1 hr away in family home, daughter is in the area. She is POA. Patient is in [place].  5. Cognitive / Functional decline:  A and O x 2. Daughter endorses more confusion and not recognizing people at times, other times being  clear. Dependent in all adls and iadls. May assist self in feeding.  I spent 60 minutes providing this consultation,  from 1300 to 1400. More than 50% of the time in this consultation was spent coordinating communication.   CODE STATUS:FULL CODE  PPS: 40%  HOSPICE ELIGIBILITY/DIAGNOSIS: TBD  Subjective:  CHIEF COMPLAINT: pain, confusion, debility.  HISTORY OF  PRESENT ILLNESS:  Meredith Shaw is a 74 y.o. year old female  with cva in 7/21 with sequelae to include pain and  Confusion,  and  Ongoing hyperglycemia. .   We are asked to consult around goals of care and advance care planning.    History obtained from review of EMR, discussion with primary team, and  interview with family, caregiver  and/or Meredith Shaw. Records reviewed and summarized above.  Review and summarization of old 80 records shows or history from other than patient  shows h/o cva and rehab, as well as ongoing sequelae Review of case with family member daughter who outlines history and possible afib as reason for CVA.  CURRENT PROBLEM LIST:  Patient Active Problem List   Diagnosis Date Noted  . 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 Essential hypertension   . Stage 3a chronic kidney disease ([place])   . Left hemiplegia ([place])   . Spastic hemiplegia affecting nondominant side ([place])   . Uncontrolled type 2 diabetes mellitus with hyperglycemia ([place])   . Dysphagia, post-stroke   . Labile blood pressure   . Dysphagia due to recent cerebral infarction 04/11/2020  . 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 Slow transit constipation   . Dyslipidemia   . Hemorrhagic cystitis   . Benign essential HTN   . UTI (urinary tract infection), bacterial   . Urinary tract infection with hematuria   . AKI (acute kidney injury) ([place])   . Acute ischemic right MCA stroke [facility]) s/p tPA & attempted revascularization, embolic stroke, source unknown 03/24/2020  . 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 Acquired hypothyroidism 08/15/2019  . Hyperlipidemia associated with type 2 diabetes mellitus ([place]) 05/14/2019  . Diabetic retinopathy ([place]) 11/09/2018  . Closed fracture of femur, intertrochanteric, right, initial encounter ([place]) 08/19/2018  . Type 2 diabetes mellitus with complication, without long-term current use of  insulin ([place]) 02/15/2018  . Closed left hip fracture ([place]) 02/10/2018  . CKD stage 3 due to type 2 diabetes mellitus ([place]) 02/10/2018  . Hip fracture ([place]) 02/10/2018  . Pressure injury of skin 02/10/2018  . Arthritis of left knee 04/09/2014  . Status post total knee replacement 04/09/2014  . Arthropathy   . Hypertension associated with type 2 diabetes mellitus ([place])   . Kidney stones   . Postablative hypothyroidism   . Degenerative arthritis of knee 06/09/2012  . Flatulence, eructation, and gas pain 12/24/2005  . Abdominal or pelvic swelling, mass, or lump, other specified site 12/24/2005  . Age-related osteoporosis with current pathological fracture with routine healing 12/04/2004  . Symptomatic menopausal or female climacteric states 12/04/2004  . Arthropathy, lower leg 04/21/2004  . Hematuria 09/17/2003  . Other long term (current) drug therapy 07/05/2002   PAST MEDICAL HISTORY:  Active Ambulatory Problems    Diagnosis Date Noted  . Degenerative arthritis of knee 06/09/2012  . Arthropathy   . Hypertension associated with type 2 diabetes mellitus ([place])   . Kidney stones   . Postablative hypothyroidism   . Arthritis of left knee 04/09/2014  . Status post total knee  replacement 04/09/2014  . Flatulence, eructation, and gas pain 12/24/2005  . Arthropathy, lower leg 04/21/2004  . Hematuria 09/17/2003  . Age-related osteoporosis with current pathological fracture with routine healing 12/04/2004  . Other long term (current) drug therapy 07/05/2002  . Symptomatic menopausal or female climacteric states 12/04/2004  . Abdominal or pelvic swelling, mass, or lump, other specified site 12/24/2005  . Closed left hip fracture ([place]) 02/10/2018  . CKD stage 3 due to type 2 diabetes mellitus ([place]) 02/10/2018  . Hip fracture ([place]) 02/10/2018  . Pressure injury of skin 02/10/2018  . Type 2 diabetes mellitus with complication, without long-term current use of insulin ([place]) 02/15/2018  .  Closed fracture of femur, intertrochanteric, right, initial encounter ([place]) 08/19/2018  . Diabetic retinopathy ([place]) 11/09/2018  . Hyperlipidemia associated with type 2 diabetes mellitus ([place]) 05/14/2019  . Acquired hypothyroidism 08/15/2019  . Acute ischemic right MCA stroke [facility]) s/p tPA & attempted revascularization, embolic stroke, source unknown 03/24/2020  . 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 AKI (acute kidney injury) ([place])   . UTI (urinary tract infection), bacterial   . Urinary tract infection with hematuria   . Dysphagia due to recent cerebral infarction 04/11/2020  . Slow transit constipation   . Dyslipidemia   . Hemorrhagic cystitis   . Benign essential HTN   . 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 Uncontrolled type 2 diabetes mellitus with hyperglycemia ([place])   . Dysphagia, post-stroke   . Labile blood pressure   . Left hemiplegia ([place])   . Spastic hemiplegia affecting nondominant side ([place])   . Stage 3a chronic kidney disease ([place])   . Essential hypertension   . 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 Diabetes ([place])   . Chest pain 02/19/2016  . Hyperlipidemia, mixed 03/01/2000  . Controlled type 2 diabetes mellitus with hyperglycemia ([place]) 02/10/2018  . Acute respiratory failure [facility])    Past Medical History:  Diagnosis Date  . Arthritis   . Cataract   . Diabetes mellitus   . Hypertension   . Hypertensive retinopathy   . Hypothyroidism   . Osteoporosis    SOCIAL HX:  Social History   Tobacco Use  . Smoking status: Never Smoker  . Smokeless tobacco: Never Used  Substance Use Topics  . Alcohol use: No   FAMILY HX:  Family History  Problem Relation Age of Onset  . Heart disease Mother   . Breast cancer Mother        39's  . Stroke Father   . Heart disease Father   . Healthy Daughter   . Healthy Son   . Liver  disease Maternal Grandmother        gall stones imbedded in liver - blead to death when in surgery   . Pneumonia Maternal Grandfather   . Diabetes Paternal Grandmother     ALLERGIES:  Allergies  Allergen Reactions  . Semaglutide Nausea And Vomiting    PO Rybelsus  . Farxiga [Dapagliflozin]     Yeast infections  . Penicillins Itching and Rash    Has tolerated Cefazlin and Ceftriaxone in past      PERTINENT MEDICATIONS:  Outpatient Encounter Medications as of 08/29/2020  Medication Sig  . acetaminophen (TYLENOL) 325 MG tablet Take 2 tablets (650 mg total) by mouth every 4 (four) hours as needed for mild pain (or temp > 37.5 C (99.5  F)). (Patient taking differently: Take 650 mg by mouth every 6 (six) hours as needed for mild pain (or temp > 37.5 C (99.5 F)). )  . Amino Acids-Protein Hydrolys (PRO-STAT) LIQD Take 1 Dose by mouth daily.  Marland Kitchen amLODipine (NORVASC) 5 MG tablet Take 5 mg by mouth daily.  Marland Kitchen ascorbic acid (VITAMIN C) 500 MG tablet Take 1 tablet by mouth in the morning and at bedtime.  Marland Kitchen aspirin EC 81 MG tablet Take 81 mg by mouth daily.  . bisacodyl (DULCOLAX) 5 MG EC tablet Take 5 mg by mouth daily as needed for moderate constipation.  Marland Kitchen ezetimibe (ZETIA) 10 MG tablet Take 1 tablet (10 mg total) by mouth daily.  Marland Kitchen gabapentin (NEURONTIN) 100 MG capsule Take by mouth 3 (three) times daily.   Marland Kitchen ibandronate (BONIVA) 3 MG/3ML SOLN injection Inject 3 mg into the vein once. Monthly on the 25th.  . insulin glargine (LANTUS) 100 UNIT/ML injection Inject 25 Units into the skin daily.  Marland Kitchen levothyroxine (SYNTHROID) 175 MCG tablet Take 1 tablet by mouth daily.  . metFORMIN (GLUCOPHAGE) 500 MG tablet Take 1 tablet (500 mg total) by mouth daily with breakfast.  . metoprolol tartrate (LOPRESSOR) 25 MG tablet Take 1 tablet (25 mg total) by mouth 2 (two) times daily.  . Multiple Vitamin (MULTIVITAMIN WITH MINERALS) TABS tablet Take 1 tablet by mouth daily.  . pantoprazole (PROTONIX) 40 MG tablet  Take 1 tablet (40 mg total) by mouth daily.  . polyethylene glycol (MIRALAX / GLYCOLAX) 17 g packet Take 17 g by mouth daily.  . pravastatin (PRAVACHOL) 40 MG tablet Take 1 tablet (40 mg total) by mouth daily at 6 PM.  . tiZANidine (ZANAFLEX) 2 MG tablet Take 0.5 tablets (1 mg total) by mouth 2 (two) times daily.  . insulin detemir (LEVEMIR) 100 UNIT/ML injection Inject 0.24 mLs (24 Units total) into the skin 2 (two) times daily. (Patient not taking: Reported on 08/29/2020)  . [DISCONTINUED] amLODipine (NORVASC) 10 MG tablet Take 1 tablet (10 mg total) by mouth daily. (Patient not taking: Reported on 08/29/2020)   No facility-administered encounter medications on file as of 08/29/2020.     Objective: ROS   General: NAD ENMT: denies dysphagia Cardiovascular: denies chest pain Pulmonary: denies  cough, denies increased SOB,  Abdomen: endorses fair appetitebowel GU: denies dysuria,  MSK:  L hemiparesis, + falls reported Skin: denies rashes or wounds Neurological: endorses weakness and hemiparesis, endorses pain, endorses changes in cognition Psych: Endorses positive mood Heme/lymph/immuno: denies bruises, abnormal bleeding  Physical Exam: Current and past weights:143, lbs, 160 lb on admission in 7/21 Constitutional: NAD General :frail appearing,WNWD EYES: anicteric sclera,lids intact, no discharge  ENMT: intact hearing,oral mucous membranes moist, dentition intact CV:  LE edema Pulmonary: no increased work of breathing, no cough, no audible wheezes, room air Abdomen: intake 50%,no ascites GU: deferred MSK: mild sacropenia, decreased ROM in all extremities,  L hemiparesis, contractures of L hand, non ambulatory Skin: warm and dry, no rashes or wounds on visible skin Neuro: Weakness, moderate cognitive impairment Psych: non -anxious affect, A and O x 2 Hem/lymph/immuno/ no widespread bruising   Thank you for the opportunity to participate in the care of Meredith Shaw.  The  palliative care team will continue to follow. Please call our office at [phone] if we can be of additional assistance.  Jason Coop, NP , DNP, MPH, AGPCNP-BC, ACHPN  COVID-19 PATIENT SCREENING TOOL  Person answering questions: ____________staff______ _____   1.  Is the  patient or any family member in the home showing any signs or symptoms regarding respiratory infection?               Person with Symptom- __________NA_________________  a. Fever                                                                          Yes___ No___          ___________________  b. Shortness of breath                                                    Yes___ No___          ___________________ c. Cough/congestion                                       Yes___  No___         ___________________ d. Body aches/pains                                                         Yes___ No___        ____________________ e. Gastrointestinal symptoms (diarrhea, nausea)           Yes___ No___        ____________________  2. Within the past 14 days, has anyone living in the home had any contact with someone with or under investigation for COVID-19?    Yes___ No_X_   Person __________________

## 2020-09-02 ENCOUNTER — Encounter (HOSPITAL_COMMUNITY): Payer: Self-pay | Admitting: Physician Assistant

## 2020-09-02 ENCOUNTER — Encounter: Payer: Self-pay | Admitting: Internal Medicine

## 2020-09-02 ENCOUNTER — Other Ambulatory Visit: Payer: Self-pay

## 2020-09-02 ENCOUNTER — Ambulatory Visit (HOSPITAL_COMMUNITY)
Admission: RE | Admit: 2020-09-02 | Discharge: 2020-09-02 | Disposition: A | Discharge status: Home / Self Care | Payer: Medicare Other | Source: Ambulatory Visit | Attending: Physician Assistant | Admitting: Physician Assistant

## 2020-09-02 VITALS — BP 118/80 | HR 67 | Ht 67.0 in | Wt 130.0 lb

## 2020-09-02 DIAGNOSIS — I480 Paroxysmal atrial fibrillation: Secondary | ICD-10-CM | POA: Diagnosis present

## 2020-09-02 DIAGNOSIS — F0390 Unspecified dementia without behavioral disturbance: Secondary | ICD-10-CM | POA: Insufficient documentation

## 2020-09-02 DIAGNOSIS — E039 Hypothyroidism, unspecified: Secondary | ICD-10-CM | POA: Diagnosis not present

## 2020-09-02 DIAGNOSIS — Z79890 Hormone replacement therapy (postmenopausal): Secondary | ICD-10-CM | POA: Insufficient documentation

## 2020-09-02 DIAGNOSIS — Z79899 Other long term (current) drug therapy: Secondary | ICD-10-CM | POA: Insufficient documentation

## 2020-09-02 DIAGNOSIS — Z7984 Long term (current) use of oral hypoglycemic drugs: Secondary | ICD-10-CM | POA: Diagnosis not present

## 2020-09-02 DIAGNOSIS — Z7982 Long term (current) use of aspirin: Secondary | ICD-10-CM | POA: Diagnosis not present

## 2020-09-02 DIAGNOSIS — Z794 Long term (current) use of insulin: Secondary | ICD-10-CM | POA: Insufficient documentation

## 2020-09-02 DIAGNOSIS — I10 Essential (primary) hypertension: Secondary | ICD-10-CM | POA: Insufficient documentation

## 2020-09-02 DIAGNOSIS — Z8673 Personal history of transient ischemic attack (TIA), and cerebral infarction without residual deficits: Secondary | ICD-10-CM | POA: Diagnosis not present

## 2020-09-02 DIAGNOSIS — Z95818 Presence of other cardiac implants and grafts: Secondary | ICD-10-CM | POA: Diagnosis not present

## 2020-09-02 DIAGNOSIS — D6869 Other thrombophilia: Secondary | ICD-10-CM | POA: Diagnosis not present

## 2020-09-02 DIAGNOSIS — Z8249 Family history of ischemic heart disease and other diseases of the circulatory system: Secondary | ICD-10-CM | POA: Diagnosis not present

## 2020-09-02 DIAGNOSIS — E119 Type 2 diabetes mellitus without complications: Secondary | ICD-10-CM | POA: Diagnosis not present

## 2020-09-02 DIAGNOSIS — Z96653 Presence of artificial knee joint, bilateral: Secondary | ICD-10-CM | POA: Diagnosis not present

## 2020-09-02 MED ORDER — APIXABAN 5 MG PO TABS: 5.0000 mg | ORAL_TABLET | Freq: Two times a day (BID) | ORAL | 3 refills | Status: DC

## 2020-09-02 NOTE — Progress Notes (Signed)
Primary Care Physician: Rosetta PosnerYudell, Sherry, MD Primary Electrophysiologist: Dr Elberta Fortisamnitz Referring Physician: Device clinic/Dr Elberta Fortisamnitz   Meredith Shaw is a 74 y.o. female with a history of prior CVA, DM, HTN, hypothyroid, dementia, and atrial fibrillation who presents for follow up in the [facility]. The patient was initially diagnosed with atrial fibrillation 08/25/20 on ILR which lasted about 4 minutes. Patient has a CHADS2VASC score of 6. History obtained from daughter who accompanies her today. ILR was placed after CVA in June 2021. She denies any symptoms during the episode. There were no specific triggers that they could identify.   Today, she denies symptoms of palpitations, chest pain, shortness of breath, orthopnea, PND, lower extremity edema, dizziness, presyncope, syncope, snoring, daytime somnolence, bleeding. The patient is tolerating medications without difficulties and is otherwise without complaint today.    Atrial Fibrillation Risk Factors:  she does not have symptoms or diagnosis of sleep apnea. she does not have a history of rheumatic fever.   she has a BMI of Body mass index is 20.36 kg/m.Marland Kitchen. Filed Weights   09/02/20 1111  Weight: 59 kg    Family History  Problem Relation Age of Onset   Heart disease Mother    Breast cancer Mother        7260's   Stroke Father    Heart disease Father    Healthy Daughter    Healthy Son    Liver disease Maternal Grandmother        gall stones imbedded in liver - blead to death when in surgery    Pneumonia Maternal Grandfather    Diabetes Paternal Grandmother      Atrial Fibrillation Management history:  Previous antiarrhythmic drugs: none Previous cardioversions: none Previous ablations: none CHADS2VASC score: 6 Anticoagulation history: none   Past Medical History:  Diagnosis Date   Arthritis    OA AND PAIN BOTH KNEES -RIGHT KNEE HURTS WORSE THAN LEFT   Cataract    Diabetes  mellitus    ORAL MEDICATION - NO INSULIN   Hypertension    Hypertensive retinopathy    OU   Hypothyroidism    Kidney stones    NONE AT PRESENT TIME THAT PT IS AWARE OF   Osteoporosis    Past Surgical History:  Procedure Laterality Date   ABDOMINAL HYSTERECTOMY     BUBBLE STUDY  03/26/2020   Procedure: BUBBLE STUDY;  Surgeon: Lars MassonNelson, Katarina H, MD;  [facility];  Service: Cardiovascular;;   CATARACT EXTRACTION     CHOLECYSTECTOMY  1972   COLONOSCOPY     EYE SURGERY     RIGHT CATARACT EXTRACTON WITH LENS IMPLANT   GROWTH REMOVED FROM THYROID     INTRAMEDULLARY (IM) NAIL INTERTROCHANTERIC Left 02/10/2018   Procedure: INTRAMEDULLARY (IM) NAIL INTERTROCHANTRIC;  Surgeon: Kathryne HitchBlackman, Christopher Y, MD;  Location: [facility] OR;  Service: Orthopedics;  Laterality: Left;   INTRAMEDULLARY (IM) NAIL INTERTROCHANTERIC Right 08/20/2018   Procedure: ORIF RIGHT HIP INTERTROC FRACTURE;  Surgeon: Kathryne HitchBlackman, Christopher Y, MD;  Location: [facility] OR;  Service: Orthopedics;  Laterality: Right;   IR CT HEAD LTD  03/24/2020   IR PERCUTANEOUS ART THROMBECTOMY/INFUSION INTRACRANIAL INC DIAG ANGIO  03/24/2020   left hip surgery   02/2018   LOOP RECORDER INSERTION N/A 04/11/2020   Procedure: LOOP RECORDER INSERTION;  Surgeon: Regan Lemmingamnitz, Will Martin, MD;  Location: [facility] INVASIVE CV LAB;  Service: Cardiovascular;  Laterality: N/A;   ORIF HIP FRACTURE Right 08/20/2018   PERCUTANEOUS NEPHROLITHOTOMY  RADIOLOGY WITH ANESTHESIA N/A 03/23/2020   Procedure: IR WITH ANESTHESIA;  Surgeon: Julieanne Cotton, MD;  Location: [facility] OR;  Service: Radiology;  Laterality: N/A;   TEE WITHOUT CARDIOVERSION N/A 03/26/2020   Procedure: TRANSESOPHAGEAL ECHOCARDIOGRAM (TEE);  Surgeon: Lars Masson, MD;  Location: [facility] ENDOSCOPY;  Service: Cardiovascular;  Laterality: N/A;   TOTAL KNEE ARTHROPLASTY  06/09/2012   Procedure: TOTAL KNEE ARTHROPLASTY;  Surgeon: Kathryne Hitch, MD;  Location: [facility] ORS;  Service:  Orthopedics;  Laterality: Right;  Right Total Knee Arthroplasty   TOTAL KNEE ARTHROPLASTY Left 04/09/2014   Procedure: LEFT TOTAL KNEE ARTHROPLASTY;  Surgeon: Kathryne Hitch, MD;  Location: [facility] OR;  Service: Orthopedics;  Laterality: Left;   TUBAL LIGATION      Current Outpatient Medications  Medication Sig Dispense Refill   acetaminophen (TYLENOL) 325 MG tablet Take 2 tablets (650 mg total) by mouth every 4 (four) hours as needed for mild pain (or temp > 37.5 C (99.5 F)). (Patient taking differently: Take 650 mg by mouth every 6 (six) hours as needed for mild pain (or temp > 37.5 C (99.5 F)). )     Amino Acids-Protein Hydrolys (PRO-STAT) LIQD Take 1 Dose by mouth daily.     amLODipine (NORVASC) 5 MG tablet Take 5 mg by mouth daily.     ascorbic acid (VITAMIN C) 500 MG tablet Take 1 tablet by mouth in the morning and at bedtime.     aspirin EC 81 MG tablet Take 81 mg by mouth daily.     bisacodyl (DULCOLAX) 5 MG EC tablet Take 5 mg by mouth daily as needed for moderate constipation.     ezetimibe (ZETIA) 10 MG tablet Take 1 tablet (10 mg total) by mouth daily.     gabapentin (NEURONTIN) 100 MG capsule Take by mouth 3 (three) times daily.      ibandronate (BONIVA) 3 MG/3ML SOLN injection Inject 3 mg into the vein once. Monthly on the 25th.     insulin detemir (LEVEMIR) 100 UNIT/ML injection Inject 0.24 mLs (24 Units total) into the skin 2 (two) times daily. 10 mL 11   insulin glargine (LANTUS) 100 UNIT/ML injection Inject 25 Units into the skin daily.     levothyroxine (SYNTHROID) 175 MCG tablet Take 1 tablet by mouth daily.     metFORMIN (GLUCOPHAGE) 500 MG tablet Take 1 tablet (500 mg total) by mouth daily with breakfast.     metoprolol tartrate (LOPRESSOR) 25 MG tablet Take 1 tablet (25 mg total) by mouth 2 (two) times daily.     Multiple Vitamin (MULTIVITAMIN WITH MINERALS) TABS tablet Take 1 tablet by mouth daily.     pantoprazole (PROTONIX) 40 MG tablet Take 1  tablet (40 mg total) by mouth daily.     polyethylene glycol (MIRALAX / GLYCOLAX) 17 g packet Take 17 g by mouth daily.     pravastatin (PRAVACHOL) 40 MG tablet Take 1 tablet (40 mg total) by mouth daily at 6 PM. 30 tablet    tiZANidine (ZANAFLEX) 2 MG tablet Take 0.5 tablets (1 mg total) by mouth 2 (two) times daily. 30 tablet 0   No current facility-administered medications for this encounter.    Allergies  Allergen Reactions   Semaglutide Nausea And Vomiting    PO Rybelsus   Farxiga [Dapagliflozin]     Yeast infections   Penicillins Itching and Rash    Has tolerated Cefazlin and Ceftriaxone in past     Social History   Socioeconomic History  Marital status: Married    Spouse name: Earvin Hansen    Number of children: 2   Years of education: 15   Highest education level: Some college, no degree  Occupational History   Occupation: retired     Associate Professor: [employer]    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    Comment: bank - 14yrs  Tobacco Use   Smoking status: Never Smoker   Smokeless tobacco: Never Used  Building services engineer Use: Never used  Substance and Sexual Activity   Alcohol use: No   Drug use: No   Sexual activity: Yes    Birth control/protection: Surgical  Other Topics Concern   Not on file  Social History Narrative   Not on file   Social Determinants of Health   Financial Resource Strain:    Difficulty of Paying Living Expenses: Not on file  Food Insecurity:    Worried About Programme researcher, broadcasting/film/video in the Last Year: Not on file   [employer] of Food in the Last Year: Not on file  Transportation Needs:    Lack of Transportation (Medical): Not on file   Lack of Transportation (Non-Medical): Not on file  Physical Activity:    Days of Exercise per Week: Not on file   Minutes of Exercise per Session: Not on file  Stress:    Feeling of Stress : Not on file  Social Connections:    Frequency of Communication with Friends and Family: Not on file    Frequency of Social Gatherings with Friends and Family: Not on file   Attends Religious Services: Not on file   Active Member of Clubs or Organizations: Not on file   Attends Banker Meetings: Not on file   Marital Status: Not on file  Intimate Partner Violence:    Fear of Current or Ex-Partner: Not on file   Emotionally Abused: Not on file   Physically Abused: Not on file   Sexually Abused: Not on file     ROS- All systems are reviewed and negative except as per the HPI above.  Physical Exam: Vitals:   09/02/20 1111  BP: 118/80  Pulse: 67  Weight: 59 kg  Height: 5\' 7"  (1.702 m)    GEN- The patient is well appearing elderly female. alert today.   Head- normocephalic, atraumatic Eyes-  Sclera clear, conjunctiva pink Ears- hearing intact Oropharynx- clear Neck- supple  Lungs- Clear to ausculation bilaterally, normal work of breathing Heart- Regular rate and rhythm, no murmurs, rubs or gallops  GI- soft, NT, ND, + BS Extremities- no clubbing, cyanosis, or edema MS- no significant deformity or atrophy Skin- no rash or lesion Psych- euthymic mood, full affect Neuro- residual left side neuro deficits  Wt Readings from Last 3 Encounters:  09/02/20 59 kg  08/18/20 63.5 kg  06/24/20 67.6 kg    EKG today demonstrates SR HR 67, PR 166, QRS 72, QTc 426  Echo 03/24/20 demonstrated  1. Left ventricular ejection fraction, by estimation, is 70 to 75%. The  left ventricle has hyperdynamic function with intracavitary gradient up to  32 mmHg. The left ventricle has no regional wall motion abnormalities.  There is mild left ventricular  hypertrophy. Left ventricular diastolic parameters are indeterminate.  2. Right ventricular systolic function is normal. The right ventricular  size is normal. Tricuspid regurgitation signal is inadequate for assessing  PA pressure.  3. The aortic valve is tricuspid. Aortic valve regurgitation is not  visualized. Mild aortic  valve sclerosis is  present, with no evidence of  aortic valve stenosis.  4. Aortic dilatation noted. 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  5. The inferior vena cava is normal in size with greater than 50%  respiratory variability, suggesting right atrial pressure of 3 mmHg.  6. Appears to be echodensity attached to left atrial side of posterior  leaflet of mitral valve (best seen on image 3), but notably only seen on  parasternal long axis images. Recomend further evaluation with with TEE to  assess for cardioembolic source of  CVA. The mitral valve is degenerative. Trivial mitral valve  regurgitation.   Epic records are reviewed at length today  CHA2DS2-VASc Score = 6  The patient's score is based upon: CHF History: 0 HTN History: 1 Diabetes History: 1 Stroke History: 2 Vascular Disease History: 0 Age Score: 1 Gender Score: 1      ASSESSMENT AND PLAN: 1. Paroxysmal Atrial Fibrillation (ICD10:  I48.0) The patient's CHA2DS2-VASc score is 6, indicating a 9.7% annual risk of stroke.   Brief episode of afib noted on ILR. General education about afib provided and questions answered. We also discussed her stroke risk and the risks and benefits of anticoagulation. Recent labs reviewed.  Will plan to stop ASA 81 mg and start Eliquis 5 mg BID for stroke prevention.  Given her age, paucity of symptoms, and other medical issues, will treat afib conservatively.   2. Secondary Hypercoagulable State (ICD10:  D68.69) The patient is at significant risk for stroke/thromboembolism based upon her CHA2DS2-VASc Score of 6.  Start Apixaban (Eliquis).   3. HTN Stable, no changes today.   Follow up with Dr End as scheduled. AF clinic in one month.    Jorja Loa PA-C Afib Clinic [facility] [address] [place], [place] [address] [phone] 09/02/2020 11:23 AM

## 2020-09-02 NOTE — Patient Instructions (Signed)
Stop Aspirin  Start Eliquis 5mg twice a day 

## 2020-09-08 ENCOUNTER — Ambulatory Visit: Payer: Medicare Other | Admitting: Cardiovascular Disease

## 2020-09-09 ENCOUNTER — Encounter: Payer: Self-pay | Admitting: Internal Medicine

## 2020-09-15 ENCOUNTER — Other Ambulatory Visit: Payer: Self-pay

## 2020-09-15 ENCOUNTER — Ambulatory Visit: Payer: Medicare Other | Admitting: Adult Health

## 2020-09-15 ENCOUNTER — Ambulatory Visit (INDEPENDENT_AMBULATORY_CARE_PROVIDER_SITE_OTHER): Payer: Medicare Other | Admitting: Internal Medicine

## 2020-09-15 ENCOUNTER — Encounter: Payer: Self-pay | Admitting: Internal Medicine

## 2020-09-15 VITALS — BP 90/60 | HR 67 | Ht 67.0 in

## 2020-09-15 DIAGNOSIS — I480 Paroxysmal atrial fibrillation: Secondary | ICD-10-CM

## 2020-09-15 DIAGNOSIS — I63511 Cerebral infarction due to unspecified occlusion or stenosis of right middle cerebral artery: Secondary | ICD-10-CM | POA: Diagnosis not present

## 2020-09-15 DIAGNOSIS — E785 Hyperlipidemia, unspecified: Secondary | ICD-10-CM

## 2020-09-15 DIAGNOSIS — I10 Essential (primary) hypertension: Secondary | ICD-10-CM | POA: Diagnosis not present

## 2020-09-15 NOTE — Patient Instructions (Signed)
Medication Instructions:  Your physician has recommended you make the following change in your medication:  STOP Amlodipine.  *If you need a refill on your cardiac medications before your next appointment, please call your pharmacy*  Lab Work: Loney Laurence will be requested from [facility].  If you have labs (blood work) drawn today and your tests are completely normal, you will receive your results only by: Marland Kitchen MyChart Message (if you have MyChart) OR . A paper copy in the mail If you have any lab test that is abnormal or we need to change your treatment, we will call you to review the results.  Testing/Procedures: none  Follow-Up: At [facility], you and your health needs are our priority.  As part of our continuing mission to provide you with exceptional heart care, we have created designated Provider Care Teams.  These Care Teams include your primary Cardiologist (physician) and Advanced Practice Providers (APPs -  Physician Assistants and Nurse Practitioners) who all work together to provide you with the care you need, when you need it.  We recommend signing up for the patient portal called "MyChart".  Sign up information is provided on this After Visit Summary.  MyChart is used to connect with patients for Virtual Visits (Telemedicine).  Patients are able to view lab/test results, encounter notes, upcoming appointments, etc.  Non-urgent messages can be sent to your provider as well.   To learn more about what you can do with MyChart, go to [email or web address].    Your next appointment:   6 week(s)  The format for your next appointment:   In Person  Provider:   You may see DR Cristal Deer END or one of the following Advanced Practice Providers on your designated Care Team:    Nicolasa Ducking, NP  Eula Listen, PA-C  Marisue Ivan, PA-C  Cadence Lolita, [place]  Gillian Shields, NP

## 2020-09-15 NOTE — Progress Notes (Signed)
Follow-up Outpatient Visit Date: 09/15/2020  Primary Care Provider: Rosetta Posner, MD [address] [place] [address]  Chief Complaint: Follow-up atrial fibrillation  HPI:  Meredith Shaw is a 74 y.o. female with history of with subsequent diagnosis of paroxysmal atrial fibrillation with implantable loop recorder, hypertension, diabetes mellitus, hypothyroidism, and dementia, who presents for follow-up of atrial fibrillation.  She was hospitalized in June with acute stroke and had a protracted hospital course.  TTE initially questioned possible vegetation involving the mitral valve, though subsequent TEE did not confirm this.  No obvious cause for stroke was seen by TEE.  Loop recorder was placed, subsequently showing a 4-minute episode of atrial fibrillation on 08/25/2020.  She was evaluated in the heart failure clinic shortly thereafter and was started on apixaban 5 mg twice daily given a CHA2DS2-VASc score of 6.  Today, Meredith Shaw reports she is doing relatively well, though she complains of increased pain in her left thigh.  She continues to have left hemiplegia and has been receiving Botox injections for spasticity in the left arm.  She is working with physical therapy at [facility].  She denies chest pain, shortness of breath, palpitations, and lightheadedness.  Her daughter is concerned about recent low blood pressures that may be exacerbated by poor oral intake.  Meredith Shaw has seemed a little more somnolent in the setting of soft blood pressure.  Another resident at Meredith Shaw's rehab facility recently had COVID-19, which prevented Meredith Shaw's family from visiting her and ensuring that she was eating and drinking adequately.  She has not had any bleeding and is tolerating apixaban well.  --------------------------------------------------------------------------------------------------  Past Medical History:  Diagnosis Date  . Arthritis    OA AND PAIN BOTH KNEES -RIGHT  KNEE HURTS WORSE THAN LEFT  . Cataract   . Diabetes mellitus    ORAL MEDICATION - NO INSULIN  . Hypertension   . Hypertensive retinopathy    OU  . Hypothyroidism   . Kidney stones    NONE AT PRESENT TIME THAT PT IS AWARE OF  . Osteoporosis   . Paroxysmal atrial fibrillation (HCC) 08/25/2020   Past Surgical History:  Procedure Laterality Date  . ABDOMINAL HYSTERECTOMY    . BUBBLE STUDY  03/26/2020   Procedure: BUBBLE STUDY;  Surgeon: Lars Masson, MD;  Location: [facility] ENDOSCOPY;  Service: Cardiovascular;;  . CATARACT EXTRACTION    . CHOLECYSTECTOMY  1972  . COLONOSCOPY    . EYE SURGERY     RIGHT CATARACT EXTRACTON WITH LENS IMPLANT  . GROWTH REMOVED FROM THYROID    . INTRAMEDULLARY (IM) NAIL INTERTROCHANTERIC Left 02/10/2018   Procedure: INTRAMEDULLARY (IM) NAIL INTERTROCHANTRIC;  Surgeon: Kathryne Hitch, MD;  Location: [facility] OR;  Service: Orthopedics;  Laterality: Left;  . INTRAMEDULLARY (IM) NAIL INTERTROCHANTERIC Right 08/20/2018   Procedure: ORIF RIGHT HIP INTERTROC FRACTURE;  Surgeon: Kathryne Hitch, MD;  Location: [facility] OR;  Service: Orthopedics;  Laterality: Right;  . IR CT HEAD LTD  03/24/2020  . IR PERCUTANEOUS ART THROMBECTOMY/INFUSION INTRACRANIAL INC DIAG ANGIO  03/24/2020  . left hip surgery   02/2018  . LOOP RECORDER INSERTION N/A 04/11/2020   Procedure: LOOP RECORDER INSERTION;  Surgeon: Regan Lemming, MD;  [facility]: [facility] INVASIVE CV LAB;  Service: Cardiovascular;  Laterality: N/A;  . ORIF HIP FRACTURE Right 08/20/2018  . PERCUTANEOUS NEPHROLITHOTOMY    . RADIOLOGY WITH ANESTHESIA N/A 03/23/2020   Procedure: IR WITH ANESTHESIA;  Surgeon: Julieanne Cotton, MD;  Location: [facility] OR;  Service: Radiology;  Laterality: N/A;  . TEE WITHOUT CARDIOVERSION N/A 03/26/2020   Procedure: TRANSESOPHAGEAL ECHOCARDIOGRAM (TEE);  Surgeon: Lars Masson, MD;  Location: [facility] ENDOSCOPY;  Service: Cardiovascular;  Laterality: N/A;  . TOTAL KNEE ARTHROPLASTY  06/09/2012    Procedure: TOTAL KNEE ARTHROPLASTY;  Surgeon: Kathryne Hitch, MD;  Location: [facility] ORS;  Service: Orthopedics;  Laterality: Right;  Right Total Knee Arthroplasty  . TOTAL KNEE ARTHROPLASTY Left 04/09/2014   Procedure: LEFT TOTAL KNEE ARTHROPLASTY;  Surgeon: Kathryne Hitch, MD;  Location: [facility] OR;  Service: Orthopedics;  Laterality: Left;  . TUBAL LIGATION      Current Meds  Medication Sig  . acetaminophen (TYLENOL) 325 MG tablet Take 2 tablets (650 mg total) by mouth every 4 (four) hours as needed for mild pain (or temp > 37.5 C (99.5 F)). (Patient taking differently: Take 650 mg by mouth every 6 (six) hours as needed for mild pain (or temp > 37.5 C (99.5 F)). )  . Amino Acids-Protein Hydrolys (PRO-STAT) LIQD Take 1 Dose by mouth daily.  Marland Kitchen amLODipine (NORVASC) 5 MG tablet Take 5 mg by mouth daily.  Marland Kitchen apixaban (ELIQUIS) 5 MG TABS tablet Take 1 tablet (5 mg total) by mouth 2 (two) times daily.  Marland Kitchen ascorbic acid (VITAMIN C) 500 MG tablet Take 1 tablet by mouth in the morning and at bedtime.  Marland Kitchen aspirin 81 MG chewable tablet Chew 81 mg by mouth daily.  . bisacodyl (DULCOLAX) 5 MG EC tablet Take 5 mg by mouth daily as needed for moderate constipation.  Marland Kitchen ezetimibe (ZETIA) 10 MG tablet Take 1 tablet (10 mg total) by mouth daily.  Marland Kitchen gabapentin (NEURONTIN) 100 MG capsule Take 100 mg by mouth at bedtime.   . ibandronate (BONIVA) 3 MG/3ML SOLN injection Inject 3 mg into the vein once. Monthly on the 25th.  . insulin detemir (LEVEMIR) 100 UNIT/ML injection Inject 0.24 mLs (24 Units total) into the skin 2 (two) times daily.  . insulin glargine (LANTUS) 100 UNIT/ML injection Inject 25 Units into the skin daily.  Marland Kitchen levothyroxine (SYNTHROID) 175 MCG tablet Take 1 tablet by mouth daily.  . metFORMIN (GLUCOPHAGE) 500 MG tablet Take 1 tablet (500 mg total) by mouth daily with breakfast.  . metoprolol tartrate (LOPRESSOR) 25 MG tablet Take 1 tablet (25 mg total) by mouth 2 (two) times daily.  . Multiple  Vitamin (MULTIVITAMIN WITH MINERALS) TABS tablet Take 1 tablet by mouth daily.  . pantoprazole (PROTONIX) 40 MG tablet Take 1 tablet (40 mg total) by mouth daily.  . polyethylene glycol (MIRALAX / GLYCOLAX) 17 g packet Take 17 g by mouth daily.  . pravastatin (PRAVACHOL) 40 MG tablet Take 1 tablet (40 mg total) by mouth daily at 6 PM.  . tiZANidine (ZANAFLEX) 2 MG tablet Take 0.5 tablets (1 mg total) by mouth 2 (two) times daily. (Patient taking differently: Take 2 mg by mouth 2 (two) times daily. )    Allergies: Semaglutide, Farxiga [dapagliflozin], and Penicillins  Social History   Tobacco Use  . Smoking status: Never Smoker  . Smokeless tobacco: Never Used  Vaping Use  . Vaping Use: Never used  Substance Use Topics  . Alcohol use: No  . Drug use: No    Family History  Problem Relation Age of Onset  . Heart disease Mother   . Breast cancer Mother        46's  . Stroke Father   . Heart disease Father   . Healthy Daughter   .  Healthy Son   . Liver disease Maternal Grandmother        gall stones imbedded in liver - blead to death when in surgery   . Pneumonia Maternal Grandfather   . Diabetes Paternal Grandmother     Review of Systems: A 12-system review of systems was performed and was negative except as noted in the HPI.  --------------------------------------------------------------------------------------------------  Physical Exam: BP 90/60 (BP Location: Right Arm, Patient Position: Sitting, Cuff Size: Normal)   Pulse 67   Ht 5\' 7"  (1.702 m)   SpO2 96%   BMI 20.36 kg/m   General: NAD.  Seated in a wheelchair.  Accompanied by her daughter. Neck: No JVD or HJR. Lungs: Mildly diminished breath sounds throughout without wheezes or crackles. Heart: Regular rate and rhythm without murmurs, rubs, or gallops. Abdomen: Soft, nontender, nondistended. Extremities: No lower extremity edema.  EKG: Normal sinus rhythm with poor R wave progression.  No significant change  from prior tracing on 09/02/2020.  Lab Results  Component Value Date   WBC 7.4 08/18/2020   HGB 12.6 08/18/2020   HCT 38.8 08/18/2020   MCV 85.5 08/18/2020   PLT 275 08/18/2020    Lab Results  Component Value Date   NA 142 08/18/2020   K 4.3 08/18/2020   CL 108 08/18/2020   CO2 26 08/18/2020   BUN 35 (H) 08/18/2020   CREATININE 1.19 (H) 08/18/2020   GLUCOSE 190 (H) 08/18/2020   ALT 19 08/18/2020    Lab Results  Component Value Date   CHOL 156 03/24/2020   HDL 27 (L) 03/24/2020   LDLCALC UNABLE TO CALCULATE IF TRIGLYCERIDE OVER 400 mg/dL 03/26/2020   LDLDIRECT 63.8 03/24/2020   TRIG 247 (H) 03/27/2020   CHOLHDL 5.8 03/24/2020    --------------------------------------------------------------------------------------------------  ASSESSMENT AND PLAN: Paroxysmal atrial fibrillation: I suspect this is the etiology of Meredith Shaw stroke in June.  Given a CHA2DS2-VASc score of at least 6, I agree with indefinite anticoagulation, as tolerated.  We will continue apixaban 5 mg twice daily.  I have requested recent labs from [facility] to ensure that renal function and blood counts remain appropriate for this dosing.  We will continue with low-dose metoprolol.  Hypertension: Blood pressure borderline low today with associated somnolence reported by Meredith Shaw's family.  I recommend discontinuation of amlodipine.  We will continue current dose of metoprolol for rate control in the setting of paroxysmal atrial fibrillation.  Hyperlipidemia: LDL well controlled on last check in June (64).  Continue pravastatin.  Stroke: Meredith Shaw continues to have left hemiplegia.  Ongoing management per neurology.  Discontinuation of aspirin should be considered in the setting of anticoagulation with apixaban, though I will defer this decision to neurology.  Continue statin and blood pressure therapy, as outlined above.  Follow-up: Return to clinic in 6 weeks.  Lyman Bishop,  MD 09/15/2020 2:52 PM

## 2020-09-16 ENCOUNTER — Encounter: Payer: Self-pay | Admitting: Internal Medicine

## 2020-09-24 ENCOUNTER — Other Ambulatory Visit: Payer: Self-pay

## 2020-09-24 ENCOUNTER — Encounter (HOSPITAL_COMMUNITY): Payer: Self-pay | Admitting: Physician Assistant

## 2020-09-24 ENCOUNTER — Ambulatory Visit (HOSPITAL_COMMUNITY)
Admission: RE | Admit: 2020-09-24 | Discharge: 2020-09-24 | Disposition: A | Discharge status: Home / Self Care | Payer: Medicare Other | Source: Ambulatory Visit | Attending: Physician Assistant | Admitting: Physician Assistant

## 2020-09-24 VITALS — BP 112/68 | HR 68 | Ht 67.0 in | Wt 137.8 lb

## 2020-09-24 DIAGNOSIS — Z7983 Long term (current) use of bisphosphonates: Secondary | ICD-10-CM | POA: Diagnosis not present

## 2020-09-24 DIAGNOSIS — I480 Paroxysmal atrial fibrillation: Secondary | ICD-10-CM | POA: Insufficient documentation

## 2020-09-24 DIAGNOSIS — F0390 Unspecified dementia without behavioral disturbance: Secondary | ICD-10-CM | POA: Diagnosis not present

## 2020-09-24 DIAGNOSIS — Z7984 Long term (current) use of oral hypoglycemic drugs: Secondary | ICD-10-CM | POA: Insufficient documentation

## 2020-09-24 DIAGNOSIS — Z823 Family history of stroke: Secondary | ICD-10-CM | POA: Diagnosis not present

## 2020-09-24 DIAGNOSIS — Z794 Long term (current) use of insulin: Secondary | ICD-10-CM | POA: Diagnosis not present

## 2020-09-24 DIAGNOSIS — Z888 Allergy status to other drugs, medicaments and biological substances status: Secondary | ICD-10-CM | POA: Insufficient documentation

## 2020-09-24 DIAGNOSIS — Z96652 Presence of left artificial knee joint: Secondary | ICD-10-CM | POA: Diagnosis not present

## 2020-09-24 DIAGNOSIS — Z79890 Hormone replacement therapy (postmenopausal): Secondary | ICD-10-CM | POA: Insufficient documentation

## 2020-09-24 DIAGNOSIS — Z8249 Family history of ischemic heart disease and other diseases of the circulatory system: Secondary | ICD-10-CM | POA: Diagnosis not present

## 2020-09-24 DIAGNOSIS — Z90710 Acquired absence of both cervix and uterus: Secondary | ICD-10-CM | POA: Diagnosis not present

## 2020-09-24 DIAGNOSIS — Z7901 Long term (current) use of anticoagulants: Secondary | ICD-10-CM | POA: Diagnosis not present

## 2020-09-24 DIAGNOSIS — D6869 Other thrombophilia: Secondary | ICD-10-CM | POA: Insufficient documentation

## 2020-09-24 DIAGNOSIS — Z79899 Other long term (current) drug therapy: Secondary | ICD-10-CM | POA: Insufficient documentation

## 2020-09-24 DIAGNOSIS — I10 Essential (primary) hypertension: Secondary | ICD-10-CM | POA: Insufficient documentation

## 2020-09-24 DIAGNOSIS — Z880 Allergy status to penicillin: Secondary | ICD-10-CM | POA: Diagnosis not present

## 2020-09-24 DIAGNOSIS — Z8673 Personal history of transient ischemic attack (TIA), and cerebral infarction without residual deficits: Secondary | ICD-10-CM | POA: Diagnosis not present

## 2020-09-24 DIAGNOSIS — E119 Type 2 diabetes mellitus without complications: Secondary | ICD-10-CM | POA: Insufficient documentation

## 2020-09-24 DIAGNOSIS — Z9049 Acquired absence of other specified parts of digestive tract: Secondary | ICD-10-CM | POA: Diagnosis not present

## 2020-09-24 DIAGNOSIS — E039 Hypothyroidism, unspecified: Secondary | ICD-10-CM | POA: Diagnosis not present

## 2020-09-24 LAB — CBC
HCT: 35.1 % — ABNORMAL LOW (ref 36.0–46.0)
Hemoglobin: 11.8 g/dL — ABNORMAL LOW (ref 12.0–15.0)
MCH: 29.2 pg (ref 26.0–34.0)
MCHC: 33.6 g/dL (ref 30.0–36.0)
MCV: 86.9 fL (ref 80.0–100.0)
Platelets: 276 10*3/uL (ref 150–400)
RBC: 4.04 MIL/uL (ref 3.87–5.11)
RDW: 13.9 % (ref 11.5–15.5)
WBC: 8.2 10*3/uL (ref 4.0–10.5)
nRBC: 0 % (ref 0.0–0.2)

## 2020-09-24 LAB — CUP PACEART REMOTE DEVICE CHECK
Date Time Interrogation Session: 20211214135516
Implantable Pulse Generator Implant Date: 20210802

## 2020-09-24 LAB — BASIC METABOLIC PANEL
Anion gap: 9 (ref 5–15)
BUN: 32 mg/dL — ABNORMAL HIGH (ref 8–23)
CO2: 25 mmol/L (ref 22–32)
Calcium: 10.1 mg/dL (ref 8.9–10.3)
Chloride: 111 mmol/L (ref 98–111)
Creatinine, Ser: 1.03 mg/dL — ABNORMAL HIGH (ref 0.44–1.00)
GFR, Estimated: 57 mL/min — ABNORMAL LOW (ref >60)
Glucose, Bld: 89 mg/dL (ref 70–99)
Potassium: 4 mmol/L (ref 3.5–5.1)
Sodium: 145 mmol/L (ref 135–145)

## 2020-09-24 NOTE — Progress Notes (Signed)
Primary Care Physician: Rosetta Posner, MD Primary Electrophysiologist: Dr Elberta Fortis Primary Cardiologist: Dr End Referring Physician: Device clinic/Dr Camnitz   Meredith Shaw is a 74 y.o. female with a history of prior CVA, DM, HTN, hypothyroid, dementia, and atrial fibrillation who presents for follow up in the [facility]. The patient was initially diagnosed with atrial fibrillation 08/25/20 on ILR which lasted about 4 minutes. Patient has a CHADS2VASC score of 6. History obtained from daughter who accompanies her today. ILR was placed after CVA in June 2021. She denies any symptoms during the episode. There were no specific triggers that they could identify.   On follow up today, patient reports she has done well since her last visit. Her ILR has shown no new episodes of afib. She denies any bleeding issues on anticoagulation.   Today, she denies symptoms of palpitations, chest pain, shortness of breath, orthopnea, PND, lower extremity edema, dizziness, presyncope, syncope, snoring, daytime somnolence, bleeding. The patient is tolerating medications without difficulties and is otherwise without complaint today.    Atrial Fibrillation Risk Factors:  she does not have symptoms or diagnosis of sleep apnea. she does not have a history of rheumatic fever.   she has a BMI of Body mass index is 21.58 kg/m.Marland Kitchen Filed Weights   09/24/20 1419  Weight: 62.5 kg    Family History  Problem Relation Age of Onset  . Heart disease Mother   . Breast cancer Mother        66's  . Stroke Father   . Heart disease Father   . Healthy Daughter   . Healthy Son   . Liver disease Maternal Grandmother        gall stones imbedded in liver - blead to death when in surgery   . Pneumonia Maternal Grandfather   . Diabetes Paternal Grandmother      Atrial Fibrillation Management history:  Previous antiarrhythmic drugs: none Previous cardioversions: none Previous  ablations: none CHADS2VASC score: 6 Anticoagulation history: Eliquis   Past Medical History:  Diagnosis Date  . Arthritis    OA AND PAIN BOTH KNEES -RIGHT KNEE HURTS WORSE THAN LEFT  . Cataract   . Diabetes mellitus    ORAL MEDICATION - NO INSULIN  . Hypertension   . Hypertensive retinopathy    OU  . Hypothyroidism   . Kidney stones    NONE AT PRESENT TIME THAT PT IS AWARE OF  . Osteoporosis   . Paroxysmal atrial fibrillation (HCC) 08/25/2020   Past Surgical History:  Procedure Laterality Date  . ABDOMINAL HYSTERECTOMY    . BUBBLE STUDY  03/26/2020   Procedure: BUBBLE STUDY;  Surgeon: Lars Masson, MD;  Location: [facility] ENDOSCOPY;  Service: Cardiovascular;;  . CATARACT EXTRACTION    . CHOLECYSTECTOMY  1972  . COLONOSCOPY    . EYE SURGERY     RIGHT CATARACT EXTRACTON WITH LENS IMPLANT  . GROWTH REMOVED FROM THYROID    . INTRAMEDULLARY (IM) NAIL INTERTROCHANTERIC Left 02/10/2018   Procedure: INTRAMEDULLARY (IM) NAIL INTERTROCHANTRIC;  Surgeon: Kathryne Hitch, MD;  Location: [facility] OR;  Service: Orthopedics;  Laterality: Left;  . INTRAMEDULLARY (IM) NAIL INTERTROCHANTERIC Right 08/20/2018   Procedure: ORIF RIGHT HIP INTERTROC FRACTURE;  Surgeon: Kathryne Hitch, MD;  Location: [facility] OR;  Service: Orthopedics;  Laterality: Right;  . IR CT HEAD LTD  03/24/2020  . IR PERCUTANEOUS ART THROMBECTOMY/INFUSION INTRACRANIAL INC DIAG ANGIO  03/24/2020  . left hip surgery   02/2018  .  LOOP RECORDER INSERTION N/A 04/11/2020   Procedure: LOOP RECORDER INSERTION;  Surgeon: Regan Lemmingamnitz, Will Martin, MD;  Location: [facility] INVASIVE CV LAB;  Service: Cardiovascular;  Laterality: N/A;  . ORIF HIP FRACTURE Right 08/20/2018  . PERCUTANEOUS NEPHROLITHOTOMY    . RADIOLOGY WITH ANESTHESIA N/A 03/23/2020   Procedure: IR WITH ANESTHESIA;  Surgeon: Julieanne Cottoneveshwar, Sanjeev, MD;  Location: [facility] OR;  Service: Radiology;  Laterality: N/A;  . TEE WITHOUT CARDIOVERSION N/A 03/26/2020   Procedure: TRANSESOPHAGEAL  ECHOCARDIOGRAM (TEE);  Surgeon: Lars MassonNelson, Katarina H, MD;  [facility];  Service: Cardiovascular;  Laterality: N/A;  . TOTAL KNEE ARTHROPLASTY  06/09/2012   Procedure: TOTAL KNEE ARTHROPLASTY;  Surgeon: Kathryne Hitchhristopher Y Blackman, MD;  Location: [facility] ORS;  Service: Orthopedics;  Laterality: Right;  Right Total Knee Arthroplasty  . TOTAL KNEE ARTHROPLASTY Left 04/09/2014   Procedure: LEFT TOTAL KNEE ARTHROPLASTY;  Surgeon: Kathryne Hitchhristopher Y Blackman, MD;  [facility];  Service: Orthopedics;  Laterality: Left;  . TUBAL LIGATION      Current Outpatient Medications  Medication Sig Dispense Refill  . acetaminophen (TYLENOL) 325 MG tablet Take 2 tablets (650 mg total) by mouth every 4 (four) hours as needed for mild pain (or temp > 37.5 C (99.5 F)).    . Amino Acids-Protein Hydrolys (PRO-STAT) LIQD Take 1 Dose by mouth daily.    Marland Kitchen. apixaban (ELIQUIS) 5 MG TABS tablet Take 1 tablet (5 mg total) by mouth 2 (two) times daily. 60 tablet 3  . ascorbic acid (VITAMIN C) 500 MG tablet Take 1 tablet by mouth in the morning and at bedtime.    . bisacodyl (DULCOLAX) 5 MG EC tablet Take 5 mg by mouth daily as needed for moderate constipation.    Marland Kitchen. ezetimibe (ZETIA) 10 MG tablet Take 1 tablet (10 mg total) by mouth daily.    Marland Kitchen. gabapentin (NEURONTIN) 100 MG capsule Take 100 mg by mouth at bedtime.     . ibandronate (BONIVA) 3 MG/3ML SOLN injection Inject 3 mg into the vein once. Monthly on the 25th.    . insulin glargine (LANTUS) 100 UNIT/ML injection Inject 25 Units into the skin daily.    Marland Kitchen. levothyroxine (SYNTHROID) 175 MCG tablet Take 1 tablet by mouth daily.    . metFORMIN (GLUCOPHAGE) 500 MG tablet Take 1 tablet (500 mg total) by mouth daily with breakfast.    . metoprolol tartrate (LOPRESSOR) 25 MG tablet Take 1 tablet (25 mg total) by mouth 2 (two) times daily.    . Multiple Vitamin (MULTIVITAMIN WITH MINERALS) TABS tablet Take 1 tablet by mouth daily.    . pantoprazole (PROTONIX) 40 MG tablet Take 1  tablet (40 mg total) by mouth daily.    . polyethylene glycol (MIRALAX / GLYCOLAX) 17 g packet Take 17 g by mouth daily.    . pravastatin (PRAVACHOL) 40 MG tablet Take 1 tablet (40 mg total) by mouth daily at 6 PM. 30 tablet   . tiZANidine (ZANAFLEX) 2 MG tablet Take 0.5 tablets (1 mg total) by mouth 2 (two) times daily. (Patient taking differently: Take 2 mg by mouth 2 (two) times daily.) 30 tablet 0   No current facility-administered medications for this encounter.    Allergies  Allergen Reactions  . Semaglutide Nausea And Vomiting    PO Rybelsus  . Farxiga [Dapagliflozin]     Yeast infections  . Penicillins Itching and Rash    Has tolerated Cefazlin and Ceftriaxone in past     Social History   Socioeconomic History  .  Marital status: Married    Spouse name: Earvin Hansen   . Number of children: 2  . Years of education: 73  . Highest education level: Some college, no degree  Occupational History  . Occupation: retired     Associate Professor: [employer]    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    Comment: bank - 22yrs  Tobacco Use  . Smoking status: Never Smoker  . Smokeless tobacco: Never Used  Vaping Use  . Vaping Use: Never used  Substance and Sexual Activity  . Alcohol use: No  . Drug use: No  . Sexual activity: Yes    Birth control/protection: Surgical  Other Topics Concern  . Not on file  Social History Narrative  . Not on file   Social Determinants of Health   Financial Resource Strain: Not on file  Food Insecurity: Not on file  Transportation Needs: Not on file  Physical Activity: Not on file  Stress: Not on file  Social Connections: Not on file  Intimate Partner Violence: Not on file     ROS- All systems are reviewed and negative except as per the HPI above.  Physical Exam: Vitals:   09/24/20 1419  BP: 112/68  Pulse: 68  Weight: 62.5 kg  Height: 5\' 7"  (1.702 m)    GEN- The patient is well appearing elderly female, alert and oriented x 3 today.   HEENT-head  normocephalic, atraumatic, sclera clear, conjunctiva pink, hearing intact, trachea midline. Lungs- Clear to ausculation bilaterally, normal work of breathing Heart- Regular rate and rhythm, no murmurs, rubs or gallops  GI- soft, NT, ND, + BS Extremities- no clubbing, cyanosis, or edema MS- no significant deformity or atrophy Skin- no rash or lesion Psych- euthymic mood, full affect Neuro- strength and sensation are intact   Wt Readings from Last 3 Encounters:  09/24/20 62.5 kg  09/02/20 59 kg  08/18/20 63.5 kg    EKG today demonstrates SR HR 68, PR 166, QRS 70, QTc 412  Echo 03/24/20 demonstrated  1. Left ventricular ejection fraction, by estimation, is 70 to 75%. The  left ventricle has hyperdynamic function with intracavitary gradient up to  32 mmHg. The left ventricle has no regional wall motion abnormalities.  There is mild left ventricular  hypertrophy. Left ventricular diastolic parameters are indeterminate.  2. Right ventricular systolic function is normal. The right ventricular  size is normal. Tricuspid regurgitation signal is inadequate for assessing  PA pressure.  3. The aortic valve is tricuspid. Aortic valve regurgitation is not  visualized. Mild aortic valve sclerosis is present, with no evidence of  aortic valve stenosis.  4. Aortic dilatation noted. 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  5. The inferior vena cava is normal in size with greater than 50%  respiratory variability, suggesting right atrial pressure of 3 mmHg.  6. Appears to be echodensity attached to left atrial side of posterior  leaflet of mitral valve (best seen on image 3), but notably only seen on  parasternal long axis images. Recomend further evaluation with with TEE to  assess for cardioembolic source of  CVA. The mitral valve is degenerative. Trivial mitral valve  regurgitation.   Epic records are reviewed at length today  CHA2DS2-VASc Score = 6  The  patient's score is based upon: CHF History: No HTN History: Yes Diabetes History: Yes Stroke History: Yes Vascular Disease History: No Age Score: 1 Gender Score: 1      ASSESSMENT AND PLAN: 1. Paroxysmal Atrial Fibrillation (ICD10:  I48.0) The patient's CHA2DS2-VASc score is 6, indicating a 9.7% annual risk of stroke.   ILR shows no interim afib episodes. Continue Eliquis 5 mg BID Check bmet/CBC today.  2. Secondary Hypercoagulable State (ICD10:  D68.69) The patient is at significant risk for stroke/thromboembolism based upon her CHA2DS2-VASc Score of 6.  Start Apixaban (Eliquis).   3. HTN Stable, no changes today.   Follow up with Dr End as scheduled. AF clinic as needed.   Jorja Loa PA-C Afib Clinic [facility] [address] [place], [place] [address] [phone] 09/24/2020 2:40 PM

## 2020-09-29 ENCOUNTER — Ambulatory Visit (INDEPENDENT_AMBULATORY_CARE_PROVIDER_SITE_OTHER): Payer: Medicare Other

## 2020-09-29 DIAGNOSIS — I480 Paroxysmal atrial fibrillation: Secondary | ICD-10-CM

## 2020-10-02 ENCOUNTER — Ambulatory Visit: Payer: Self-pay | Admitting: Physician Assistant

## 2020-10-06 ENCOUNTER — Ambulatory Visit (INDEPENDENT_AMBULATORY_CARE_PROVIDER_SITE_OTHER): Payer: Medicare Other | Admitting: Physician Assistant

## 2020-10-06 ENCOUNTER — Other Ambulatory Visit: Payer: Self-pay

## 2020-10-06 ENCOUNTER — Encounter: Payer: Self-pay | Admitting: Physician Assistant

## 2020-10-06 VITALS — BP 89/61 | HR 87 | Ht 64.0 in

## 2020-10-06 DIAGNOSIS — R3129 Other microscopic hematuria: Secondary | ICD-10-CM | POA: Diagnosis not present

## 2020-10-06 DIAGNOSIS — R339 Retention of urine, unspecified: Secondary | ICD-10-CM

## 2020-10-06 LAB — BLADDER SCAN AMB NON-IMAGING

## 2020-10-06 NOTE — Progress Notes (Signed)
10/06/2020 11:04 AM   Meredith Shaw November 08, 1945 [id number]  CC: Chief Complaint  Patient presents with  . Follow-up   HPI: Meredith Shaw is a 74 y.o. female with PMH nephrolithiasis, postoperative urinary retention, and more recently urinary retention following CVA in June 2021 associated with hemorrhagic cystitis who subsequently passed a voiding trial in November 2021 who presents today for repeat PVR.  She is accompanied today by her daughter, who contributes to HPI.  Daughter reports patient has been minimally mobile since her last clinic visit.  She is reporting multiple body aches and pains secondary to this.  She has denied dysuria, urgency, or frequency.  Patient briefly awoke during today's visit to deny these as well as lower abdominal pain. PVR .  On chart review, patient has had microscopic hematuria dating back to at least 2015.  Renal ultrasound dated 03/24/2020 with no acute abnormalities, however she was noted to have right renal atrophy and urinary bladder was not visualized.  She is a never smoker, no known family history of kidney or bladder cancers.  PMH: Past Medical History:  Diagnosis Date  . Arthritis    OA AND PAIN BOTH KNEES -RIGHT KNEE HURTS WORSE THAN LEFT  . Cataract   . Diabetes mellitus    ORAL MEDICATION - NO INSULIN  . Hypertension   . Hypertensive retinopathy    OU  . Hypothyroidism   . Kidney stones    NONE AT PRESENT TIME THAT PT IS AWARE OF  . Osteoporosis   . Paroxysmal atrial fibrillation (HCC) 08/25/2020    Surgical History: Past Surgical History:  Procedure Laterality Date  . ABDOMINAL HYSTERECTOMY    . BUBBLE STUDY  03/26/2020   Procedure: BUBBLE STUDY;  Surgeon: Lars Masson, MD;  Location: [facility] ENDOSCOPY;  Service: Cardiovascular;;  . CATARACT EXTRACTION    . CHOLECYSTECTOMY  1972  . COLONOSCOPY    . EYE SURGERY     RIGHT CATARACT EXTRACTON WITH LENS IMPLANT  . GROWTH REMOVED FROM THYROID    . INTRAMEDULLARY  (IM) NAIL INTERTROCHANTERIC Left 02/10/2018   Procedure: INTRAMEDULLARY (IM) NAIL INTERTROCHANTRIC;  Surgeon: Kathryne Hitch, MD;  Location: [facility] OR;  Service: Orthopedics;  Laterality: Left;  . INTRAMEDULLARY (IM) NAIL INTERTROCHANTERIC Right 08/20/2018   Procedure: ORIF RIGHT HIP INTERTROC FRACTURE;  Surgeon: Kathryne Hitch, MD;  Location: [facility] OR;  Service: Orthopedics;  Laterality: Right;  . IR CT HEAD LTD  03/24/2020  . IR PERCUTANEOUS ART THROMBECTOMY/INFUSION INTRACRANIAL INC DIAG ANGIO  03/24/2020  . left hip surgery   02/2018  . LOOP RECORDER INSERTION N/A 04/11/2020   Procedure: LOOP RECORDER INSERTION;  Surgeon: Regan Lemming, MD;  Location: [facility] INVASIVE CV LAB;  Service: Cardiovascular;  Laterality: N/A;  . ORIF HIP FRACTURE Right 08/20/2018  . PERCUTANEOUS NEPHROLITHOTOMY    . RADIOLOGY WITH ANESTHESIA N/A 03/23/2020   Procedure: IR WITH ANESTHESIA;  Surgeon: Julieanne Cotton, MD;  Location: [facility] OR;  Service: Radiology;  Laterality: N/A;  . TEE WITHOUT CARDIOVERSION N/A 03/26/2020   Procedure: TRANSESOPHAGEAL ECHOCARDIOGRAM (TEE);  Surgeon: Lars Masson, MD;  [facility] ENDOSCOPY;  Service: Cardiovascular;  Laterality: N/A;  . TOTAL KNEE ARTHROPLASTY  06/09/2012   Procedure: TOTAL KNEE ARTHROPLASTY;  Surgeon: Kathryne Hitch, MD;  Location: [facility] ORS;  Service: Orthopedics;  Laterality: Right;  Right Total Knee Arthroplasty  . TOTAL KNEE ARTHROPLASTY Left 04/09/2014   Procedure: LEFT TOTAL KNEE ARTHROPLASTY;  Surgeon: Kathryne Hitch, MD;  Location: [facility] OR;  Service: Orthopedics;  Laterality: Left;  . TUBAL LIGATION      Home Medications:  Allergies as of 10/06/2020      Reactions   Semaglutide Nausea And Vomiting   PO Rybelsus   Farxiga [dapagliflozin]    Yeast infections   Penicillins Itching, Rash   Has tolerated Cefazlin and Ceftriaxone in past       Medication List       Accurate as of October 06, 2020 11:04 AM. If you have any  questions, ask your nurse or doctor.        acetaminophen 325 MG tablet Commonly known as: TYLENOL Take 2 tablets (650 mg total) by mouth every 4 (four) hours as needed for mild pain (or temp > 37.5 C (99.5 F)).   apixaban 5 MG Tabs tablet Commonly known as: Eliquis Take 1 tablet (5 mg total) by mouth 2 (two) times daily.   ascorbic acid 500 MG tablet Commonly known as: VITAMIN C Take 1 tablet by mouth in the morning and at bedtime.   bisacodyl 5 MG EC tablet Commonly known as: DULCOLAX Take 5 mg by mouth daily as needed for moderate constipation.   ezetimibe 10 MG tablet Commonly known as: ZETIA Take 1 tablet (10 mg total) by mouth daily.   gabapentin 100 MG capsule Commonly known as: NEURONTIN Take 100 mg by mouth at bedtime.   ibandronate 3 MG/3ML Soln injection Commonly known as: BONIVA Inject 3 mg into the vein once. Monthly on the 25th.   insulin glargine 100 UNIT/ML injection Commonly known as: LANTUS Inject 25 Units into the skin daily.   levothyroxine 175 MCG tablet Commonly known as: SYNTHROID Take 1 tablet by mouth daily.   metFORMIN 500 MG tablet Commonly known as: GLUCOPHAGE Take 1 tablet (500 mg total) by mouth daily with breakfast.   metoprolol tartrate 25 MG tablet Commonly known as: LOPRESSOR Take 1 tablet (25 mg total) by mouth 2 (two) times daily.   multivitamin with minerals Tabs tablet Take 1 tablet by mouth daily.   pantoprazole 40 MG tablet Commonly known as: PROTONIX Take 1 tablet (40 mg total) by mouth daily.   polyethylene glycol 17 g packet Commonly known as: MIRALAX / GLYCOLAX Take 17 g by mouth daily.   pravastatin 40 MG tablet Commonly known as: PRAVACHOL Take 1 tablet (40 mg total) by mouth daily at 6 PM.   Pro-Stat Liqd Take 1 Dose by mouth daily.   tiZANidine 2 MG tablet Commonly known as: ZANAFLEX Take 0.5 tablets (1 mg total) by mouth 2 (two) times daily. What changed: how much to take       Allergies:   Allergies  Allergen Reactions  . Semaglutide Nausea And Vomiting    PO Rybelsus  . Farxiga [Dapagliflozin]     Yeast infections  . Penicillins Itching and Rash    Has tolerated Cefazlin and Ceftriaxone in past     Family History: Family History  Problem Relation Age of Onset  . Heart disease Mother   . Breast cancer Mother        6's  . Stroke Father   . Heart disease Father   . Healthy Daughter   . Healthy Son   . Liver disease Maternal Grandmother        gall stones imbedded in liver - blead to death when in surgery   . Pneumonia Maternal Grandfather   . Diabetes Paternal Grandmother     Social History:   reports that she has  never smoked. She has never used smokeless tobacco. She reports that she does not drink alcohol and does not use drugs.  Physical Exam: BP (!) 89/61 (BP Location: Left Arm, Patient Position: Sitting, Cuff Size: Normal)   Pulse 87   Ht 5\' 4"  (1.626 m)   BMI 23.65 kg/m   Constitutional:  Alert and oriented, no acute distress, nontoxic appearing HEENT: [place], AT Cardiovascular: No clubbing, cyanosis, or edema Respiratory: Normal respiratory effort, no increased work of breathing Skin: No rashes, bruises or suspicious lesions Neurologic: Grossly intact, no focal deficits, moving all 4 extremities Psychiatric: Normal mood and affect  Laboratory Data: Results for orders placed or performed in visit on 10/06/20  Bladder Scan (Post Void Residual) in office  Result Value Ref Range   Scan Result 10/08/20    Assessment & Plan:   1. Urinary retention Resolved with stable PVR today.  No further intervention indicated. - Bladder Scan (Post Void Residual) in office  2. Microscopic hematuria I had a lengthy conversation with the patient's daughter today regarding evaluation for microscopic hematuria.  I explained that many factors can cause hematuria, including nephrolithiasis, of which she has a known history.  I explained that other possible sources  include bladder or kidney cancers and that we cannot rule these out without hematuria work-up.  Daughter expressed understanding but wishes to defer further evaluation at this time given her mother's ongoing debility following CVA.  We specifically discussed that the risks of deferring evaluation include missing a possible malignancy.  Daughter expressed understanding; in shared decision-making we will plan for cath UA with possible hematuria work-up based on results in approximately 6 months.  Return in about 6 months (around 04/06/2021) for Cath UA for possible MH.  04/08/2021, PA-C  [facility] [address], [address] [place], [place] [phone]

## 2020-10-07 ENCOUNTER — Non-Acute Institutional Stay: Payer: Medicare Other | Admitting: Primary Care

## 2020-10-07 DIAGNOSIS — G8194 Hemiplegia, unspecified affecting left nondominant side: Secondary | ICD-10-CM

## 2020-10-07 DIAGNOSIS — I69391 Dysphagia following cerebral infarction: Secondary | ICD-10-CM

## 2020-10-07 DIAGNOSIS — I69398 Other sequelae of cerebral infarction: Secondary | ICD-10-CM

## 2020-10-07 DIAGNOSIS — Z515 Encounter for palliative care: Secondary | ICD-10-CM

## 2020-10-07 NOTE — Progress Notes (Signed)
[facility] Consult Note Telephone: [phone]  Fax: [phone]     Date of encounter: 10/07/20 PATIENT [name] [place] [id number] [phone] (home)  DOB: [birth year] MRN: [id number]  PRIMARY CARE PROVIDER:    Rica Koyanagi, MD,  [place] [address] [phone]  REFERRING PROVIDER:   Rica Koyanagi, MD [address] [place],  [place] [address] [phone]  RESPONSIBLE PARTY:   Extended Emergency Contact Information Primary Emergency Contact: [name],[name] Address: [address]          [place], [place] [address] [place] Phone: [phone] Relation: Daughter Secondary Emergency Contact: [place] Sellar Address: [address]          [place], [place] [address] [place] Phone: [phone] Mobile Phone: [phone] Relation: Spouse  I met face to face with patient in the facility. Palliative Care was asked to follow this patient by consultation request of Rica Koyanagi, MD to help address advance care planning and goals of care. This is a follow up visit.   ASSESSMENT AND RECOMMENDATIONS:   1. Advance Care Planning/Goals of Care: Goals include to maximize quality of life and symptom management. I spoke with daughter and POA. Our advance care planning conversation included a discussion about:     The value and importance of advance care planning   Experiences with loved ones who have been seriously ill or have died   Exploration of personal, cultural or spiritual beliefs that might influence medical decisions   Exploration of goals of care in the event of a sudden injury or illness    Review and updating of an  advance directive document - discussed current choices for full scope of interventions. Meredith Shaw said they needed to redo this form and make the choices more conservative. We discussed patient's decline in  past weeks. Daughter outlined that this illness has been declining x 6 months. Decision  to de-escalate disease focused treatments due to poor prognosis: Discussed not taking patient out for specialty visits, esp. For botox injections to control arm pain. Discussed hematuria they decided not to work up due to not wanting to pursue surgery or chemo options if it were due to cancer. Daughter reiterates she does not want to prolong suffering for her mother.  I spent 20 minutes providing this consultation,  from 1200 to 1220. More than 50% of the time in this consultation was spent coordinating communication.   ----------------------------------------------------------------------------------------------------------  2. Symptom Management:  Pain: Pain has been escalating over past weeks. This is due to allodynia from CVA. Her rx has include  botox injections for left arm pain. We discussed the onerous burden for going to appointments and family would like to Rx in place as much as possible. We discussed small narcotic dosing around the clock.   Patient is exhibiting moderate pain on PAINAD scale, withdrawn, non interactive, also at times endorsing. She has only acetaminophen currently for pain.PAINAD  4/10 today, family reports 8/10 in afternoons frequently  I recommend 25 mg tramadol tid to qid for pain as a basal dose and can titrate from this dosing once tolerated.She is likely approaching end of life which frequently has increased pain manifestation.  Intake: Poor, family endorses having to feed her most meals. They usually encourage her to feed self one meal a day. Patient has had a 22% weight loss in 6 months. Recommend continued following to monitor  for decline and hospice referral when family is ready.  3. Follow up Palliative Care Visit: Palliative care will continue to follow for goals of care clarification and symptom management. Return 2-3 weeks or prn.  4. Family /Caregiver/Community  Supports: Daugher and patient husband are Garment/textile technologist. Currently in LTC. Husband lives some distance from SNF, 1 hr drive.  5. Cognitive / Functional decline: A and o x 1, dependent in all adls and iadls.  CODE STATUS: FULL CODE PER SNF records, discussed desire to change to comfort measures with daughter.  PPS: 30%  HOSPICE ELIGIBILITY/DIAGNOSIS: yes with concordant goals of care/weight loss  Subjective:  CHIEF COMPLAINT: pain  HISTORY OF PRESENT ILLNESS:  Meredith Shaw is a 74 y.o. year old female  with pain and abnormal weight loss. Pain is intermittent and in L side of body in context of CVA 6 months ago. Pain is constant but seems to worsen in afternoon. It is not relieved with current regimen of oTC analgesic. Some relief with botox injection but no longer able to go to the appointments.   We are asked to consult around advance care planning and complex medical decision making.   Review and summarization of old Epic records shows or history from other than patient made. Family discussion and decision made to de escalate care Review of case with family member Meredith Shaw, daughter. Worsening pain, poor intake with sequelae which impact QOL and well being  History obtained from review of EMR, discussion with primary team, and  interview with family, caregiver  and/or Meredith Shaw. Records reviewed and summarized above.   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 06/24/2020   Essential hypertension    Stage 3a chronic kidney disease (HCC)    Left hemiplegia (HCC)    Spastic hemiplegia affecting nondominant side (HCC)    Uncontrolled type 2 diabetes mellitus with hyperglycemia (HCC)    Dysphagia, post-stroke    Labile blood pressure    Dysphagia due to recent  cerebral infarction 04/11/2020   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   Slow transit constipation    Hyperlipidemia LDL goal <70    Hemorrhagic cystitis    Benign essential HTN    UTI (urinary tract infection), bacterial    Urinary tract infection with hematuria    AKI (acute kidney injury) ([place])    Acute ischemic right MCA stroke (HCC) s/p tPA & attempted revascularization, embolic stroke, source unknown 03/24/2020   Middle cerebral artery embolism, right 03/24/2020   Acquired hypothyroidism 08/15/2019   Hyperlipidemia associated with type 2 diabetes mellitus ([place]) 05/14/2019   Diabetic retinopathy ([place]) 11/09/2018   Closed fracture of femur, intertrochanteric, right, initial encounter ([place]) 08/19/2018   Type 2 diabetes mellitus with complication, without long-term current use of insulin ([place]) 02/15/2018   Closed left hip fracture ([place]) 02/10/2018   CKD stage 3 due to type 2 diabetes mellitus ([place]) 02/10/2018   Hip fracture ([place]) 02/10/2018   Pressure injury of skin 02/10/2018   Arthritis of left knee 04/09/2014   Status post total knee replacement 04/09/2014   Arthropathy    Hypertension associated with type 2 diabetes mellitus ([place])    Kidney stones    Postablative hypothyroidism    Degenerative arthritis of knee 06/09/2012   Flatulence, eructation, and gas pain 12/24/2005  Abdominal or pelvic swelling, mass, or lump, other specified site 12/24/2005   Age-related osteoporosis with current pathological fracture with routine healing 12/04/2004   Symptomatic menopausal or female climacteric states 12/04/2004   Arthropathy, lower leg 04/21/2004   Hematuria 09/17/2003   Other long term (current) drug therapy 07/05/2002   PAST MEDICAL HISTORY:  Active Ambulatory Problems    Diagnosis Date Noted   Degenerative arthritis of knee 06/09/2012   Arthropathy    Hypertension associated with type 2 diabetes mellitus ([place])    Kidney  stones    Postablative hypothyroidism    Arthritis of left knee 04/09/2014   Status post total knee replacement 04/09/2014   Flatulence, eructation, and gas pain 12/24/2005   Arthropathy, lower leg 04/21/2004   Hematuria 09/17/2003   Age-related osteoporosis with current pathological fracture with routine healing 12/04/2004   Other long term (current) drug therapy 07/05/2002   Symptomatic menopausal or female climacteric states 12/04/2004   Abdominal or pelvic swelling, mass, or lump, other specified site 12/24/2005   Closed left hip fracture ([place]) 02/10/2018   CKD stage 3 due to type 2 diabetes mellitus ([place]) 02/10/2018   Hip fracture ([place]) 02/10/2018   Pressure injury of skin 02/10/2018   Type 2 diabetes mellitus with complication, without long-term current use of insulin ([place]) 02/15/2018   Closed fracture of femur, intertrochanteric, right, initial encounter ([place]) 08/19/2018   Diabetic retinopathy ([place]) 11/09/2018   Hyperlipidemia associated with type 2 diabetes mellitus ([place]) 05/14/2019   Acquired hypothyroidism 08/15/2019   Acute ischemic right MCA stroke (HCC) s/p tPA & attempted revascularization, embolic stroke, source unknown 03/24/2020   Middle cerebral artery embolism, right 03/24/2020   AKI (acute kidney injury) ([place])    UTI (urinary tract infection), bacterial    Urinary tract infection with hematuria    Dysphagia due to recent cerebral infarction 04/11/2020   Slow transit constipation    Hyperlipidemia LDL goal <70    Hemorrhagic cystitis    Benign essential HTN    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   Uncontrolled type 2 diabetes mellitus with hyperglycemia (HCC)    Dysphagia, post-stroke    Labile blood pressure    Left hemiplegia (HCC)    Spastic hemiplegia affecting nondominant side (HCC)    Stage 3a chronic kidney disease ([place])    Essential hypertension    Neurogenic pain due to central nervous system  abnormality following stroke 06/24/2020   Paroxysmal atrial fibrillation ([place]) 09/02/2020   Secondary hypercoagulable state ([place]) 09/02/2020   Resolved Ambulatory Problems    Diagnosis Date Noted   Diabetes ([place])    Chest pain 02/19/2016   Hyperlipidemia, mixed 03/01/2000   Controlled type 2 diabetes mellitus with hyperglycemia ([place]) 02/10/2018   Acute respiratory failure (HCC)    Past Medical History:  Diagnosis Date   Arthritis    Cataract    Diabetes mellitus    Hypertension    Hypertensive retinopathy    Hypothyroidism    Osteoporosis    SOCIAL HX:  Social History   Tobacco Use   Smoking status: Never Smoker   Smokeless tobacco: Never Used  Substance Use Topics   Alcohol use: No   FAMILY HX:  Family History  Problem Relation Age of Onset   Heart disease Mother    Breast cancer Mother        77's   Stroke Father    Heart disease Father    Healthy Daughter    Healthy Son    Liver  disease Maternal Grandmother        gall stones imbedded in liver - blead to death when in surgery    Pneumonia Maternal Grandfather    Diabetes Paternal Grandmother       ALLERGIES:  Allergies  Allergen Reactions   Semaglutide Nausea And Vomiting    PO Rybelsus   Farxiga [Dapagliflozin]     Yeast infections   Penicillins Itching and Rash    Has tolerated Cefazlin and Ceftriaxone in past      PERTINENT MEDICATIONS:  Outpatient Encounter Medications as of 10/07/2020  Medication Sig   acetaminophen (TYLENOL) 325 MG tablet Take 2 tablets (650 mg total) by mouth every 4 (four) hours as needed for mild pain (or temp > 37.5 C (99.5 F)).   Amino Acids-Protein Hydrolys (PRO-STAT) LIQD Take 1 Dose by mouth daily.   apixaban (ELIQUIS) 5 MG TABS tablet Take 1 tablet (5 mg total) by mouth 2 (two) times daily.   ascorbic acid (VITAMIN C) 500 MG tablet Take 1 tablet by mouth in the morning and at bedtime.   bisacodyl (DULCOLAX) 5 MG EC tablet Take 5 mg by  mouth daily as needed for moderate constipation.   ezetimibe (ZETIA) 10 MG tablet Take 1 tablet (10 mg total) by mouth daily.   gabapentin (NEURONTIN) 100 MG capsule Take 100 mg by mouth at bedtime.    ibandronate (BONIVA) 3 MG/3ML SOLN injection Inject 3 mg into the vein once. Monthly on the 25th.   insulin glargine (LANTUS) 100 UNIT/ML injection Inject 25 Units into the skin daily.   levothyroxine (SYNTHROID) 175 MCG tablet Take 1 tablet by mouth daily.   metFORMIN (GLUCOPHAGE) 500 MG tablet Take 1 tablet (500 mg total) by mouth daily with breakfast.   metoprolol tartrate (LOPRESSOR) 25 MG tablet Take 1 tablet (25 mg total) by mouth 2 (two) times daily.   Multiple Vitamin (MULTIVITAMIN WITH MINERALS) TABS tablet Take 1 tablet by mouth daily.   pantoprazole (PROTONIX) 40 MG tablet Take 1 tablet (40 mg total) by mouth daily.   polyethylene glycol (MIRALAX / GLYCOLAX) 17 g packet Take 17 g by mouth daily.   pravastatin (PRAVACHOL) 40 MG tablet Take 1 tablet (40 mg total) by mouth daily at 6 PM.   tiZANidine (ZANAFLEX) 2 MG tablet Take 0.5 tablets (1 mg total) by mouth 2 (two) times daily. (Patient taking differently: Take 2 mg by mouth 2 (two) times daily.)   No facility-administered encounter medications on file as of 10/07/2020.    Objective:  ROS/staff  General: NAD ENMT:  H/o  dysphagia Pulmonary: denies  cough, denies increased SOB Abdomen: endorses fair appetite, endorses  occ constipation, endorses incontinence of bowel GU: denies dysuria, endorses incontinence of urine, cath is d/c'ed recently. MSK:  endorses ROM limitations, no falls reported Skin: denies rashes or wounds Neurological: endorses weakness, fatigue, endorses  pain, denies insomnia Psych: Endorses withdrawn  mood Heme/lymph/immuno: denies bruises, abnormal bleeding  Physical Exam: Current and past weights: weight currently 137 lbs, baseline weight was 170 lbs in 6/21. This is a 20% weight loss in 6  months Constitutional:  NAD, PAINAD  4/10 today, family reports 58/10 in afternoons frequently General: frail appearing, WNWD EYES: anicteric sclera,lids intact, no discharge  ENMT: intact hearing,oral mucous membranes dry, dentition intact CV: S1S2, RRR, no LE edema Pulmonary: LCTA, no increased work of breathing, no cough, no audible wheezes, room air Abdomen: intake 25-50%, requires to be fed,  normo-active BS +  4 quadrants, soft and  non tender, no ascites, GU: deferred MSK: severe sarcopenia, decreased ROM in all extremities, Left hemiparesis,  non ambulatory Skin: warm and dry, no rashes or wounds on visible skin Neuro: Generalized weakness, fatigue, s/o CVA and L sided deficits, severe cognitive impairment Psych: flat affect, A and O x 1 Hem/lymph/immuno: no widespread bruising   Thank you for the opportunity to participate in the care of Meredith Shaw.  The palliative care team will continue to follow. Please call our office at [phone] if we can be of additional assistance.  Jason Coop, NP , DNP, MPH, AGPCNP-BC, ACHPN  COVID-19 PATIENT SCREENING TOOL  Person answering questions: ____________staff______ _____   1.  Is the patient or any family member in the home showing any signs or symptoms regarding respiratory infection?               Person with Symptom- __________NA_________________  a. Fever                                                                          Yes___ No___          ___________________  b. Shortness of breath                                                    Yes___ No___          ___________________ c. Cough/congestion                                       Yes___  No___         ___________________ d. Body aches/pains                                                         Yes___ No___        ____________________ e. Gastrointestinal symptoms (diarrhea, nausea)           Yes___ No___        ____________________  2. Within the past 14  days, has anyone living in the home had any contact with someone with or under investigation for COVID-19?    Yes___ No_X_   Person __________________

## 2020-10-13 NOTE — Progress Notes (Signed)
Carelink Summary Report / Loop Recorder 

## 2020-10-14 ENCOUNTER — Encounter: Payer: Medicare Other | Admitting: Physical Medicine & Rehabilitation

## 2020-10-16 ENCOUNTER — Ambulatory Visit: Payer: Medicare Other | Admitting: Adult Health

## 2020-10-23 ENCOUNTER — Telehealth: Payer: Self-pay | Admitting: Internal Medicine

## 2020-10-23 NOTE — Telephone Encounter (Signed)
Pending mychart consent  °

## 2020-10-27 ENCOUNTER — Ambulatory Visit (INDEPENDENT_AMBULATORY_CARE_PROVIDER_SITE_OTHER): Payer: Medicare Other

## 2020-10-27 DIAGNOSIS — I480 Paroxysmal atrial fibrillation: Secondary | ICD-10-CM | POA: Diagnosis not present

## 2020-10-29 LAB — CUP PACEART REMOTE DEVICE CHECK
Date Time Interrogation Session: 20220116135722
Implantable Pulse Generator Implant Date: 20210802

## 2020-10-30 ENCOUNTER — Non-Acute Institutional Stay: Payer: Medicare Other | Admitting: Primary Care

## 2020-10-30 ENCOUNTER — Telehealth (INDEPENDENT_AMBULATORY_CARE_PROVIDER_SITE_OTHER): Payer: Medicare Other | Admitting: Internal Medicine

## 2020-10-30 ENCOUNTER — Other Ambulatory Visit: Payer: Self-pay

## 2020-10-30 ENCOUNTER — Encounter: Payer: Self-pay | Admitting: Internal Medicine

## 2020-10-30 VITALS — BP 108/68 | HR 74 | Temp 98.3°F | Ht 64.0 in | Wt 140.6 lb

## 2020-10-30 DIAGNOSIS — Z515 Encounter for palliative care: Secondary | ICD-10-CM

## 2020-10-30 DIAGNOSIS — I480 Paroxysmal atrial fibrillation: Secondary | ICD-10-CM

## 2020-10-30 DIAGNOSIS — M792 Neuralgia and neuritis, unspecified: Secondary | ICD-10-CM

## 2020-10-30 DIAGNOSIS — I63511 Cerebral infarction due to unspecified occlusion or stenosis of right middle cerebral artery: Secondary | ICD-10-CM | POA: Diagnosis not present

## 2020-10-30 DIAGNOSIS — I10 Essential (primary) hypertension: Secondary | ICD-10-CM

## 2020-10-30 DIAGNOSIS — N183 Chronic kidney disease, stage 3 unspecified: Secondary | ICD-10-CM

## 2020-10-30 DIAGNOSIS — E1122 Type 2 diabetes mellitus with diabetic chronic kidney disease: Secondary | ICD-10-CM

## 2020-10-30 DIAGNOSIS — I69391 Dysphagia following cerebral infarction: Secondary | ICD-10-CM

## 2020-10-30 DIAGNOSIS — G8110 Spastic hemiplegia affecting unspecified side: Secondary | ICD-10-CM

## 2020-10-30 DIAGNOSIS — I69398 Other sequelae of cerebral infarction: Secondary | ICD-10-CM

## 2020-10-30 DIAGNOSIS — G8194 Hemiplegia, unspecified affecting left nondominant side: Secondary | ICD-10-CM

## 2020-10-30 NOTE — Patient Instructions (Signed)
Medication Instructions:  Your physician recommends that you continue on your current medications as directed. Please refer to the Current Medication list given to you today.  *If you need a refill on your cardiac medications before your next appointment, please call your pharmacy*  Follow-Up: At [facility], you and your health needs are our priority.  As part of our continuing mission to provide you with exceptional heart care, we have created designated Provider Care Teams.  These Care Teams include your primary Cardiologist (physician) and Advanced Practice Providers (APPs -  Physician Assistants and Nurse Practitioners) who all work together to provide you with the care you need, when you need it.  We recommend signing up for the patient portal called "MyChart".  Sign up information is provided on this After Visit Summary.  MyChart is used to connect with patients for Virtual Visits (Telemedicine).  Patients are able to view lab/test results, encounter notes, upcoming appointments, etc.  Non-urgent messages can be sent to your provider as well.   To learn more about what you can do with MyChart, go to [email or web address].    Your next appointment:   4 months  The format for your next appointment:   Virtual Visit   Provider:   You may see DR Cristal Deer END or one of the following Advanced Practice Providers on your designated Care Team:    Nicolasa Ducking, NP  Eula Listen, PA-C  Marisue Ivan, PA-C  Cadence [place], [place]  Gillian Shields, NP

## 2020-10-30 NOTE — Progress Notes (Signed)
Virtual Visit via Telephone Note   This visit type was conducted due to national recommendations for restrictions regarding the COVID-19 Pandemic (e.g. social distancing) in an effort to limit this patient's exposure and mitigate transmission in our community.  Due to her co-morbid illnesses, this patient is at least at moderate risk for complications without adequate follow up.  This format is felt to be most appropriate for this patient at this time.  The patient did not have access to video technology/had technical difficulties with video requiring transitioning to audio format only (telephone).  All issues noted in this document were discussed and addressed.  No physical exam could be performed with this format.  Please refer to the patient's chart for her  consent to telehealth for [facility].    Date:  10/30/2020   ID:  Meredith Shaw, DOB 11/20/1945, MRN [id number] The patient was identified using 2 identifiers.  Patient Location: [place] Provider Location: Office/Clinic  PCP:  Rica Koyanagi, MD  Cardiologist:  Nelva Bush, MD Electrophysiologist:  None   Evaluation Performed:  Follow-Up Visit  Chief Complaint: Weakness and fatigue  History of Present Illness:    Meredith Shaw is a 75 y.o. female with history of stroke with subsequent diagnosis of PAF with implantable loop recorder, HTN, DM 2, hypothyroidism, and dementia, with whom I am speaking for follow-up of atrial fibrillation.  I met her in 09/2020 for follow-up of her atrial fibrillation.  She was doing well other than increasing pain in her left thigh associated with left hemiplegia.  Due to somnolence reported by Meredith Shaw's family as well as borderline low blood pressure, we agreed to discontinue amlodipine but continue metoprolol for rate control.  Today, the patient is very somnolent and unable to provide any history over the phone.  I was able to speak with her daughter, Meredith Shaw, who is  at the bedside.  She reports that her mother's health has declined over the last 2 to 3 weeks.  She is now very somnolent and bedbound.  Her blood pressure improved after discontinuation of amlodipine at our last visit.  She has been too weak to participate in physical therapy.  She continues to eat and drink fairly well, though this seems to have decreased a little bit over the last couple of weeks.  Pall[facility] care has been [facility], though there are no plans to transition to hospice at this time.   Past Medical History:  Diagnosis Date  . Arthritis    OA AND PAIN BOTH KNEES -RIGHT KNEE HURTS WORSE THAN LEFT  . Cataract   . Diabetes mellitus    ORAL MEDICATION - NO INSULIN  . Hypertension   . Hypertensive retinopathy    OU  . Hypothyroidism   . Kidney stones    NONE AT PRESENT TIME THAT PT IS AWARE OF  . Osteoporosis   . Paroxysmal atrial fibrillation ([place]) 08/25/2020   Past Surgical History:  Procedure Laterality Date  . ABDOMINAL HYSTERECTOMY    . BUBBLE STUDY  03/26/2020   Procedure: BUBBLE STUDY;  Surgeon: Dorothy Spark, MD;  Location: [place];  Service: Cardiovascular;;  . CATARACT EXTRACTION    . CHOLECYSTECTOMY  1972  . COLONOSCOPY    . EYE SURGERY     RIGHT CATARACT EXTRACTON WITH LENS IMPLANT  . GROWTH REMOVED FROM THYROID    . INTRAMEDULLARY (IM) NAIL INTERTROCHANTERIC Left 02/10/2018   Procedure: INTRAMEDULLARY (IM) NAIL INTERTROCHANTRIC;  Surgeon: Ninfa Linden,  Lind Guest, MD;  Location: [place];  Service: Orthopedics;  Laterality: Left;  . INTRAMEDULLARY (IM) NAIL INTERTROCHANTERIC Right 08/20/2018   Procedure: ORIF RIGHT HIP INTERTROC FRACTURE;  Surgeon: Mcarthur Rossetti, MD;  Location: [place];  Service: Orthopedics;  Laterality: Right;  . IR CT HEAD LTD  03/24/2020  . IR PERCUTANEOUS ART THROMBECTOMY/INFUSION INTRACRANIAL INC DIAG ANGIO  03/24/2020  . left hip surgery   02/2018  . LOOP RECORDER INSERTION N/A 04/11/2020   Procedure: LOOP  RECORDER INSERTION;  Surgeon: Constance Haw, MD;  Location: [place] [facility];  Service: Cardiovascular;  Laterality: N/A;  . ORIF HIP FRACTURE Right 08/20/2018  . PERCUTANEOUS NEPHROLITHOTOMY    . RADIOLOGY WITH ANESTHESIA N/A 03/23/2020   Procedure: IR WITH ANESTHESIA;  Surgeon: Luanne Bras, MD;  Location: [place];  Service: Radiology;  Laterality: N/A;  . TEE WITHOUT CARDIOVERSION N/A 03/26/2020   Procedure: TRANSESOPHAGEAL ECHOCARDIOGRAM (TEE);  Surgeon: Dorothy Spark, MD;  Location: [facility];  Service: Cardiovascular;  Laterality: N/A;  . TOTAL KNEE ARTHROPLASTY  06/09/2012   Procedure: TOTAL KNEE ARTHROPLASTY;  Surgeon: Mcarthur Rossetti, MD;  Location: [facility];  Service: Orthopedics;  Laterality: Right;  Right Total Knee Arthroplasty  . TOTAL KNEE ARTHROPLASTY Left 04/09/2014   Procedure: LEFT TOTAL KNEE ARTHROPLASTY;  Surgeon: Mcarthur Rossetti, MD;  Location: [place];  Service: Orthopedics;  Laterality: Left;  . TUBAL LIGATION       Current Meds  Medication Sig  . acetaminophen (TYLENOL) 325 MG tablet Take 2 tablets (650 mg total) by mouth every 4 (four) hours as needed for mild pain (or temp > 37.5 C (99.5 F)).  . Amino Acids-Protein Hydrolys (PRO-STAT) LIQD Take 1 Dose by mouth daily.  Marland Kitchen apixaban (ELIQUIS) 5 MG TABS tablet Take 1 tablet (5 mg total) by mouth 2 (two) times daily.  Marland Kitchen ascorbic acid (VITAMIN C) 500 MG tablet Take 1 tablet by mouth in the morning and at bedtime.  . bisacodyl (DULCOLAX) 5 MG EC tablet Take 5 mg by mouth daily as needed for moderate constipation.  Marland Kitchen ezetimibe (ZETIA) 10 MG tablet Take 1 tablet (10 mg total) by mouth daily.  Marland Kitchen gabapentin (NEURONTIN) 100 MG capsule Take 100 mg by mouth at bedtime.   . ibandronate (BONIVA) 3 MG/3ML SOLN injection Inject 3 mg into the vein once. Monthly on the 25th.  . insulin glargine (LANTUS) 100 UNIT/ML injection Inject 25 Units into the skin daily.  Marland Kitchen levothyroxine (SYNTHROID) 175 MCG tablet Take  1 tablet by mouth daily.  . metFORMIN (GLUCOPHAGE) 500 MG tablet Take 1 tablet (500 mg total) by mouth daily with breakfast.  . metoprolol tartrate (LOPRESSOR) 25 MG tablet Take 1 tablet (25 mg total) by mouth 2 (two) times daily.  . Multiple Vitamin (MULTIVITAMIN WITH MINERALS) TABS tablet Take 1 tablet by mouth daily.  . pantoprazole (PROTONIX) 40 MG tablet Take 1 tablet (40 mg total) by mouth daily.  . polyethylene glycol (MIRALAX / GLYCOLAX) 17 g packet Take 17 g by mouth daily.  . pravastatin (PRAVACHOL) 40 MG tablet Take 1 tablet (40 mg total) by mouth daily at 6 PM.  . tiZANidine (ZANAFLEX) 2 MG tablet Take 0.5 tablets (1 mg total) by mouth 2 (two) times daily. (Patient taking differently: Take 2 mg by mouth 2 (two) times daily.)     Allergies:   Semaglutide, Farxiga [dapagliflozin], and Penicillins   Social History   Tobacco Use  . Smoking status: Never Smoker  . Smokeless  tobacco: Never Used  Vaping Use  . Vaping Use: Never used  Substance Use Topics  . Alcohol use: No  . Drug use: No     Family Hx: The patient's family history includes Breast cancer in her mother; Diabetes in her paternal grandmother; Healthy in her daughter and son; Heart disease in her father and mother; Liver disease in her maternal grandmother; Pneumonia in her maternal grandfather; Stroke in her father.   Labs/Other Tests and Data Reviewed:    EKG:  No ECG reviewed.  Recent Labs: 02/21/2020: TSH 0.693 03/31/2020: Magnesium 2.0 08/18/2020: ALT 19 09/24/2020: BUN 32; Creatinine, Ser 1.03; Hemoglobin 11.8; Platelets 276; Potassium 4.0; Sodium 145   Recent Lipid Panel Lab Results  Component Value Date/Time   CHOL 156 03/24/2020 04:50 AM   CHOL 163 11/15/2019 08:59 AM   TRIG 247 (H) 03/27/2020 07:07 AM   TRIG 456 (H) 07/03/2013 03:04 PM   HDL 27 (L) 03/24/2020 04:50 AM   HDL 33 (L) 11/15/2019 08:59 AM   HDL 36 (L) 07/03/2013 03:04 PM   CHOLHDL 5.8 03/24/2020 04:50 AM   LDLCALC UNABLE TO  CALCULATE IF TRIGLYCERIDE OVER 400 mg/dL 03/24/2020 04:50 AM   LDLCALC 83 11/15/2019 08:59 AM   LDLCALC NOTES: 07/03/2013 03:04 PM   LDLDIRECT 63.8 03/24/2020 04:50 AM    Wt Readings from Last 3 Encounters:  10/30/20 140 lb 9.6 oz (63.8 kg)  09/24/20 137 lb 12.8 oz (62.5 kg)  09/02/20 130 lb (59 kg)      Objective:    Vital Signs:  BP 108/68   Pulse 74   Temp 98.3 F (36.8 C)   Ht 5' 4"  (1.626 m)   Wt 140 lb 9.6 oz (63.8 kg)   SpO2 97%   BMI 24.13 kg/m    VITAL SIGNS:  reviewed  ASSESSMENT & PLAN:    Accessible atrial fibrillation: Heart rate and blood pressure reasonable and vital signs reported today.  Continue indefinite anticoagulation, as tolerated, with apixaban given PAF, prior stroke, and CHA2DS2-VASc score of 6.  Stroke: Patient very somnolent and essentially bedbound at this point.  Continue anticoagulation for PAF to prevent further strokes.  Hypertension: Blood pressure low normal but a bit higher after discontinuation of amlodipine at our last visit.  No medication changes today.   Time:   Today, I have spent 8 minutes with the patient with telehealth technology discussing the above problems.     Medication Adjustments/Labs and Tests Ordered: Current medicines are reviewed at length with the patient today.  Concerns regarding medicines are outlined above.   Tests Ordered: None.  Medication Changes: None.  Follow Up:  Virtual Visit  in 4 month(s)  Signed, Nelva Bush, MD  10/30/2020 12:09 PM    [place]

## 2020-10-30 NOTE — Progress Notes (Signed)
[facility] Palliative Care Consult Note Telephone: [phone]  Fax: [phone]     Date of encounter: 10/30/20 PATIENT [name] [id number] [phone] (home)  DOB: 07/18/1946 MRN: [id number]  PRIMARY CARE PROVIDER:    Rica Koyanagi, MD,  [place] [address] [phone]  REFERRING PROVIDER:   Rica Koyanagi, MD [address] [place],  [place] [address] [phone]  RESPONSIBLE PARTY:   Extended Emergency Contact Information Primary Emergency Contact: [name],[name] Address: [address]          [place], [place] [address] [place]te Litter of [place] Phone: [phone] Relation: Daughter Secondary Emergency Contact: Dulce Sellar Address: [address]          [place], [place] [address] [place] of [place] Phone: [phone] Mobile Phone: [phone] Relation: Spouse  I met face to face with patient and family in the facility. Palliative Care was asked to follow this patient by consultation request of Rica Koyanagi, MD to help address advance care planning and goals of care. This is a follow up   visit.   ASSESSMENT AND RECOMMENDATIONS:   1. Advance Care Planning/Goals of Care: Goals include to maximize quality of life and symptom management. Our advance care planning conversation included a discussion about:     The value and importance of advance care planning    Exploration of personal, cultural or spiritual beliefs that might influence medical decisions   Review and updating or creation of an  advance directive document .  Decision to de-escalate disease focused treatments due to poor prognosis.  I met with patient and her daughter in her nursing home room. Daughter reached out this am  to continue discussion about patient decline and hospice appropriateness. She states that she is interested in de-escalation of care and interventions. We  discussed her being seen by cardiology as well as Neurology. We discussed that diagnostics were usually not done unless a treatment plan was proposed. She states they don't plan to do any more interventions other than comfort management and said that she would likely discontinue these consultations.   We discussed hospice program and and services. Explained that at [facility] the CNA's have not traditionally been able to participate with that nurse ,chaplain and social worker visit.  There was also bereavement services for the family. I will continue to monitor for point at which patient's prognosis seems consistent with hospice programming. She has certainly had loss of function and decreased intake over the last several months and eligibility is  likely reasonable in the near future. Daughter outlined that she knew patient's debility was not reversible and that she could see significant decline. She again verbalized her desire for comfort measures and de-escalation of Medical interventions.  I spent 20 minutes providing this consultation,  from 1100 to 1120. More than 50% of the time in this consultation was spent in counseling and care coordination. ------------------------------------------------------------------------------------------------ 2. Symptom Management:   Nutrition: We discussed patient's nutrition;  she must be fed each meal by staff. Daughter states that she and other family are there to feed lunch every day. She did not think there was recent weight loss, which I will verify with skilled facility.Recommend dietary supplements on tray TID.  Pain management currently only has PRN Tylenol and she is not able to request this. I recommend ATC Tylenol CR 650 mg Q8 hours. Patient seems restless moving her right arm and leg frequently. I  would like to treat for musculoskeletal pain with Tylenol around the clock and then consider other pharmaceutical categories such as Rx for neural pain.    Mobility: Patient is now bedbound and has contracture in her left upper arm. Daughter also felt that foregoing the brace would be something she would advocate due to the fact that the brace is painful and she is concentrating on comfort measures. Recommending to d/c brace as goals are comfort and not for rehab measures.  3. Follow up Palliative Care Visit: Palliative care will continue to follow for goals of care clarification and symptom management. Return 1-2 weeks or prn.  4. Family /Caregiver/Community Supports: Daughter and husband are POA, husband lives 1 hr away, daughter local.   5. Cognitive / Functional decline:  A and O x 1, bedbound. Dependent in all adls and Iadls.  CODE STATUS: DNR prepared per daughter report. Full code is still on chart. Decision to de-escalate disease focused treatments due to poor prognosis.  PPS: 30% (weak)  HOSPICE ELIGIBILITY/DIAGNOSIS: yes/weight loss 77 kg in 7/21, now 61.3 kg, 20% loss in 6 months.  Albumin 3.3 in 11.21.  Subjective:  CHIEF COMPLAINT: weight loss  HISTORY OF PRESENT ILLNESS:  Meredith Shaw is a 75 y.o. year old female  with CVA in 7/21, continued debility, L hemiplegia, LUE contracture, weight loss, neuropathic pain. Currently patient has PAINAD sx equating 5/10 scale. Pain appears constant neuropathic in limbs, in context of CVA in 7/21 and sequelae. She is posturing and frequently moving her R UE and LE. She  Can be distracted but then returns to the movements. She has prn tylenol but not evident  if that has been effective as patient cannot ask  For med due to cognition  We are asked to consult around advance care planning and complex medical decision making.    Review and summarization of old Epic records shows or history from other than patient. H/o CVA and f/u noted Review or lab tests, radiology,  or medicine albumin 3.3 in 11.21 Review of case with family member Daughter Clarene Critchley  History obtained from review of EMR,  discussion with primary team, and  interview with family, caregiver  and/or Ms. Arreaga. Records reviewed and summarized above.   CURRENT PROBLEM LIST:  Patient Active Problem List   Diagnosis Date Noted  . Paroxysmal atrial fibrillation ([place]) 09/02/2020  . Secondary hypercoagulable state ([place]) 09/02/2020  . 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 Essential hypertension   . Stage 3a chronic kidney disease ([place])   . Left hemiplegia ([place])   . Spastic hemiplegia affecting nondominant side ([place])   . Uncontrolled type 2 diabetes mellitus with hyperglycemia ([place])   . Dysphagia, post-stroke   . Labile blood pressure   . Dysphagia due to recent cerebral infarction 04/11/2020  . 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 Slow transit constipation   . Hyperlipidemia LDL goal <70   . Hemorrhagic cystitis   . Benign essential HTN   . UTI (urinary tract infection), bacterial   . Urinary tract infection with hematuria   . AKI (acute kidney injury) ([place])   . Acute ischemic right MCA stroke [facility]) s/p tPA & attempted revascularization, embolic stroke, source unknown 03/24/2020  . 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 Acquired hypothyroidism 08/15/2019  . Hyperlipidemia associated with type 2 diabetes mellitus ([place]) 05/14/2019  . Diabetic retinopathy ([place]) 11/09/2018  . Closed fracture of femur, intertrochanteric, right, initial  encounter ([place]) 08/19/2018  . Type 2 diabetes mellitus with complication, without long-term current use of insulin ([place]) 02/15/2018  . Closed left hip fracture ([place]) 02/10/2018  . CKD stage 3 due to type 2 diabetes mellitus ([place]) 02/10/2018  . Hip fracture ([place]) 02/10/2018  . Pressure injury of skin 02/10/2018  . Arthritis of left knee 04/09/2014  . Status post total knee replacement 04/09/2014  . Arthropathy   . Hypertension associated with type 2 diabetes mellitus ([place])   . Kidney stones   .  Postablative hypothyroidism   . Degenerative arthritis of knee 06/09/2012  . Flatulence, eructation, and gas pain 12/24/2005  . Abdominal or pelvic swelling, mass, or lump, other specified site 12/24/2005  . Age-related osteoporosis with current pathological fracture with routine healing 12/04/2004  . Symptomatic menopausal or female climacteric states 12/04/2004  . Arthropathy, lower leg 04/21/2004  . Hematuria 09/17/2003  . Other long term (current) drug therapy 07/05/2002   PAST MEDICAL HISTORY:  Active Ambulatory Problems    Diagnosis Date Noted  . Degenerative arthritis of knee 06/09/2012  . Arthropathy   . Hypertension associated with type 2 diabetes mellitus ([place])   . Kidney stones   . Postablative hypothyroidism   . Arthritis of left knee 04/09/2014  . Status post total knee replacement 04/09/2014  . Flatulence, eructation, and gas pain 12/24/2005  . Arthropathy, lower leg 04/21/2004  . Hematuria 09/17/2003  . Age-related osteoporosis with current pathological fracture with routine healing 12/04/2004  . Other long term (current) drug therapy 07/05/2002  . Symptomatic menopausal or female climacteric states 12/04/2004  . Abdominal or pelvic swelling, mass, or lump, other specified site 12/24/2005  . Closed left hip fracture ([place]) 02/10/2018  . CKD stage 3 due to type 2 diabetes mellitus ([place]) 02/10/2018  . Hip fracture ([place]) 02/10/2018  . Pressure injury of skin 02/10/2018  . Type 2 diabetes mellitus with complication, without long-term current use of insulin ([place]) 02/15/2018  . Closed fracture of femur, intertrochanteric, right, initial encounter ([place]) 08/19/2018  . Diabetic retinopathy ([place]) 11/09/2018  . Hyperlipidemia associated with type 2 diabetes mellitus ([place]) 05/14/2019  . Acquired hypothyroidism 08/15/2019  . Acute ischemic right MCA stroke [facility]) s/p tPA & attempted revascularization, embolic stroke, source unknown 03/24/2020  . Middle cerebral artery embolism,  right 03/24/2020  . AKI (acute kidney injury) ([place])   . UTI (urinary tract infection), bacterial   . Urinary tract infection with hematuria   . Dysphagia due to recent cerebral infarction 04/11/2020  . Slow transit constipation   . Hyperlipidemia LDL goal <70   . Hemorrhagic cystitis   . Benign essential HTN   . 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 Uncontrolled type 2 diabetes mellitus with hyperglycemia ([place])   . Dysphagia, post-stroke   . Labile blood pressure   . Left hemiplegia ([place])   . Spastic hemiplegia affecting nondominant side ([place])   . Stage 3a chronic kidney disease ([place])   . Essential hypertension   . 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 Paroxysmal atrial fibrillation ([place]) 09/02/2020  . Secondary hypercoagulable state ([place]) 09/02/2020   Resolved Ambulatory Problems    Diagnosis Date Noted  . Diabetes ([place])   . Chest pain 02/19/2016  . Hyperlipidemia, mixed 03/01/2000  . Controlled type 2 diabetes mellitus with hyperglycemia ([place]) 02/10/2018  . Acute respiratory failure [facility])    Past Medical History:  Diagnosis Date  . Arthritis   . Cataract   .  Diabetes mellitus   . Hypertension   . Hypertensive retinopathy   . Hypothyroidism   . Osteoporosis    SOCIAL HX:  Social History   Tobacco Use  . Smoking status: Never Smoker  . Smokeless tobacco: Never Used  Substance Use Topics  . Alcohol use: No   FAMILY HX:  Family History  Problem Relation Age of Onset  . Heart disease Mother   . Breast cancer Mother        72's  . Stroke Father   . Heart disease Father   . Healthy Daughter   . Healthy Son   . Liver disease Maternal Grandmother        gall stones imbedded in liver - blead to death when in surgery   . Pneumonia Maternal Grandfather   . Diabetes Paternal Grandmother       ALLERGIES:  Allergies  Allergen Reactions  . Semaglutide Nausea And Vomiting    PO Rybelsus  .  Farxiga [Dapagliflozin]     Yeast infections  . Penicillins Itching and Rash    Has tolerated Cefazlin and Ceftriaxone in past      PERTINENT MEDICATIONS:  Outpatient Encounter Medications as of 10/30/2020  Medication Sig  . acetaminophen (TYLENOL) 325 MG tablet Take 2 tablets (650 mg total) by mouth every 4 (four) hours as needed for mild pain (or temp > 37.5 C (99.5 F)).  . Amino Acids-Protein Hydrolys (PRO-STAT) LIQD Take 1 Dose by mouth daily.  Marland Kitchen apixaban (ELIQUIS) 5 MG TABS tablet Take 1 tablet (5 mg total) by mouth 2 (two) times daily.  Marland Kitchen ascorbic acid (VITAMIN C) 500 MG tablet Take 1 tablet by mouth in the morning and at bedtime.  . bisacodyl (DULCOLAX) 5 MG EC tablet Take 5 mg by mouth daily as needed for moderate constipation.  Marland Kitchen ezetimibe (ZETIA) 10 MG tablet Take 1 tablet (10 mg total) by mouth daily.  Marland Kitchen gabapentin (NEURONTIN) 100 MG capsule Take 100 mg by mouth at bedtime.   . ibandronate (BONIVA) 3 MG/3ML SOLN injection Inject 3 mg into the vein once. Monthly on the 25th.  . insulin glargine (LANTUS) 100 UNIT/ML injection Inject 25 Units into the skin daily.  Marland Kitchen levothyroxine (SYNTHROID) 175 MCG tablet Take 1 tablet by mouth daily.  . metFORMIN (GLUCOPHAGE) 500 MG tablet Take 1 tablet (500 mg total) by mouth daily with breakfast.  . metoprolol tartrate (LOPRESSOR) 25 MG tablet Take 1 tablet (25 mg total) by mouth 2 (two) times daily.  . Multiple Vitamin (MULTIVITAMIN WITH MINERALS) TABS tablet Take 1 tablet by mouth daily.  . pantoprazole (PROTONIX) 40 MG tablet Take 1 tablet (40 mg total) by mouth daily.  . polyethylene glycol (MIRALAX / GLYCOLAX) 17 g packet Take 17 g by mouth daily.  . pravastatin (PRAVACHOL) 40 MG tablet Take 1 tablet (40 mg total) by mouth daily at 6 PM.  . tiZANidine (ZANAFLEX) 2 MG tablet Take 0.5 tablets (1 mg total) by mouth 2 (two) times daily. (Patient taking differently: Take 2 mg by mouth 2 (two) times daily.)   No facility-administered encounter  medications on file as of 10/30/2020.    Objective: ROS/family input  General: NAD ENMT: endorses some dysphagia Cardiovascular: denies chest pain Pulmonary: denies  cough, denies increased SOB Abdomen: endorses poor appetite, endorses incontinence of bowel GU: denies dysuria, endorses incontinence of urine MSK:  endorses ROM limitations, no falls reported, bedbound Skin: denies rashes or wounds Neurological: endorses weakness, endorses pain, denies insomnia, sleeping more. L  hemiparesis. Psych: Endorses positive mood Heme/lymph/immuno: denies bruises, abnormal bleeding  Physical Exam: Current and past weights: stable @ 130 lbs  per family report, but 40 lb loss in 9 months. Constitutional:  NAD General: frail appearing, thin EYES: anicteric sclera,lids intact, no discharge  ENMT: intact hearing,oral mucous membranes moist, dentition intact CV:  no LE edema Pulmonary:no increased work of breathing, no cough, no audible wheezes, room air Abdomen: intake 25-50%,  no ascites MSK: mod sarcopenia, decreased ROM in all extremities, L hemiparesis,  non -ambulatory, bedbound Skin: warm and dry, no rashes or wounds on visible skin  Neuro: Generalized weakness, + cognitive impairment, deficits from CVA, L side Psych: non-anxious affect, A and O x 1 Hem/lymph/immuno: no widespread bruising  Thank you for the opportunity to participate in the care of Ms. Bolyard.  The palliative care team will continue to follow. Please call our office at [phone] if we can be of additional assistance.  Jason Coop, NP , DNP, MPH, AGPCNP-BC, ACHPN  COVID-19 PATIENT SCREENING TOOL  Person answering questions: ____________staff______ _____   1.  Is the patient or any family member in the home showing any signs or symptoms regarding respiratory infection?               Person with Symptom- __________NA_________________  a. Fever                                                                           Yes___ No___          ___________________  b. Shortness of breath                                                    Yes___ No___          ___________________ c. Cough/congestion                                       Yes___  No___         ___________________ d. Body aches/pains                                                         Yes___ No___        ____________________ e. Gastrointestinal symptoms (diarrhea, nausea)           Yes___ No___        ____________________  2. Within the past 14 days, has anyone living in the home had any contact with someone with or under investigation for COVID-19?    Yes___ No_X_   Person __________________

## 2020-11-03 ENCOUNTER — Telehealth: Payer: Self-pay

## 2020-11-03 NOTE — Telephone Encounter (Signed)
Appears AF, refer to AF clinic

## 2020-11-03 NOTE — Telephone Encounter (Signed)
Spoke to [place] ([facility]). Advised AF was seen on ILR and discussed Dr. Estill Dooms would like for patient to follow up with AF Clinic. Patient is currently on OAC ~ Eliquis. Agreeable to plan.

## 2020-11-03 NOTE — Telephone Encounter (Signed)
ILR (implanted for CVA) alert received for SR w/ ectopy but unable to exclude AF? 4 events logged with all a duration of 4 minutes. Routing to Dr. Elberta Fortis for review and recommendations.

## 2020-11-04 ENCOUNTER — Encounter: Payer: Medicare Other | Admitting: Physical Medicine & Rehabilitation

## 2020-11-05 ENCOUNTER — Non-Acute Institutional Stay: Payer: Medicare Other | Admitting: Primary Care

## 2020-11-05 ENCOUNTER — Other Ambulatory Visit: Payer: Self-pay

## 2020-11-05 DIAGNOSIS — G8194 Hemiplegia, unspecified affecting left nondominant side: Secondary | ICD-10-CM

## 2020-11-05 DIAGNOSIS — Z515 Encounter for palliative care: Secondary | ICD-10-CM

## 2020-11-05 DIAGNOSIS — M792 Neuralgia and neuritis, unspecified: Secondary | ICD-10-CM

## 2020-11-05 DIAGNOSIS — I69391 Dysphagia following cerebral infarction: Secondary | ICD-10-CM

## 2020-11-05 DIAGNOSIS — I69398 Other sequelae of cerebral infarction: Secondary | ICD-10-CM

## 2020-11-05 NOTE — Progress Notes (Signed)
[facility] Palliative Care Consult Note Telephone: [phone]  Fax: [phone]     Date of encounter: 11/05/20 PATIENT [name] [place] [address] [phone] (home)  DOB: 04-02-1946 MRN: [id number]  PRIMARY CARE PROVIDER:    Rica Koyanagi, MD,  [address] [place] [address] [phone]  REFERRING PROVIDER:   Rica Koyanagi, MD [address] [place],  [place] [address] [phone]  RESPONSIBLE PARTY:   Extended Emergency Contact Information Primary Emergency Contact: [name],[name] Address: [address]          [place], [place] [address] [place] of [place] Phone: [phone] Relation: Daughter Secondary Emergency Contact: Dulce Sellar Address: [address]          [place], [place] [address] [place] Phone: [phone] Mobile Phone: [phone] Relation: Spouse  I met face to face with patient in facility. Palliative Care was asked to follow this patient by consultation request of Rica Koyanagi, MD to help address advance care planning and goals of care. This is a follow up  visit.  ASSESSMENT AND RECOMMENDATIONS:   1. Advance Care Planning/Goals of Care: Goals include to maximize quality of life and symptom management. Family has decided for hospice care and de escalation of  Care.  I received a call from the snf today regarding patient's continued decline. On arriving at the facility patient was resting but arousable. She was able to state that she was not in pain and that she had eaten some breakfast. Per family request her upper arm splint has been removed. Staff has stated that she has declined more in her intake and is now bedbound. Her weight has decreased 15% in six months which is 30 pounds. Social worker at the facility has been in touch with the family and they are ready to elect hospice services. [facility] medical Director has  certified  eligibility and the facility will send in a referral. I have sent supporting information to referral intake as well   2. Symptom Management:  Pain: Will be managed by hospice for allodynia and pain due to contractures.  Nutrition: Poor intake, has lost weight and now is comfort care.  3. Follow up Palliative Care Visit: refer to hospice.  4. Family /Caregiver/Community Supports: Daughter is Designer, industrial/product, lives in [place].  5. Cognitive / Functional decline: Alert, O x 1, dependent in all adls, iadls.  I spent 20 minutes providing this consultation,  from 1000 to 1020. More than 50% of the time in this consultation was spent in counseling and care coordination.  CODE STATUS: DNR  PPS: 30%  HOSPICE ELIGIBILITY/DIAGNOSIS: TBD  Subjective:  CHIEF COMPLAINT: debility  HISTORY OF PRESENT ILLNESS:  Meredith Shaw is a 75 y.o. year old female  with cva, l hemiplegia, wt loss, anorexia .   We are asked to consult around advance care planning and complex medical decision making.    History obtained from review of EMR, discussion with primary team, and  interview with family, caregiver  and/or Ms. Bracco. Records reviewed and summarized above.   CURRENT PROBLEM LIST:  Patient Active Problem List   Diagnosis Date Noted  . Paroxysmal atrial fibrillation ([place]) 09/02/2020  . Secondary hypercoagulable state ([place]) 09/02/2020  . 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 Essential hypertension   . Stage 3a chronic kidney disease ([place])   . Left hemiplegia ([place])   . Spastic hemiplegia affecting  nondominant side ([place])   . Uncontrolled type 2 diabetes mellitus with hyperglycemia ([place])   . Dysphagia, post-stroke   . Labile blood pressure   . Dysphagia due to recent cerebral infarction 04/11/2020  . 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 Slow transit constipation   . Hyperlipidemia LDL goal <70   . Hemorrhagic cystitis   . Benign  essential HTN   . UTI (urinary tract infection), bacterial   . Urinary tract infection with hematuria   . AKI (acute kidney injury) ([place])   . Acute ischemic right MCA stroke [facility]) s/p tPA & attempted revascularization, embolic stroke, source unknown 03/24/2020  . 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 Acquired hypothyroidism 08/15/2019  . Hyperlipidemia associated with type 2 diabetes mellitus ([place]) 05/14/2019  . Diabetic retinopathy ([place]) 11/09/2018  . Closed fracture of femur, intertrochanteric, right, initial encounter ([place]) 08/19/2018  . Type 2 diabetes mellitus with complication, without long-term current use of insulin ([place]) 02/15/2018  . Closed left hip fracture ([place]) 02/10/2018  . CKD stage 3 due to type 2 diabetes mellitus ([place]) 02/10/2018  . Hip fracture ([place]) 02/10/2018  . Pressure injury of skin 02/10/2018  . Arthritis of left knee 04/09/2014  . Status post total knee replacement 04/09/2014  . Arthropathy   . Hypertension associated with type 2 diabetes mellitus ([place])   . Kidney stones   . Postablative hypothyroidism   . Degenerative arthritis of knee 06/09/2012  . Flatulence, eructation, and gas pain 12/24/2005  . Abdominal or pelvic swelling, mass, or lump, other specified site 12/24/2005  . Age-related osteoporosis with current pathological fracture with routine healing 12/04/2004  . Symptomatic menopausal or female climacteric states 12/04/2004  . Arthropathy, lower leg 04/21/2004  . Hematuria 09/17/2003  . Other long term (current) drug therapy 07/05/2002   PAST MEDICAL HISTORY:  Active Ambulatory Problems    Diagnosis Date Noted  . Degenerative arthritis of knee 06/09/2012  . Arthropathy   . Hypertension associated with type 2 diabetes mellitus ([place])   . Kidney stones   . Postablative hypothyroidism   . Arthritis of left knee 04/09/2014  . Status post total knee replacement 04/09/2014  . Flatulence, eructation, and gas pain 12/24/2005  .  Arthropathy, lower leg 04/21/2004  . Hematuria 09/17/2003  . Age-related osteoporosis with current pathological fracture with routine healing 12/04/2004  . Other long term (current) drug therapy 07/05/2002  . Symptomatic menopausal or female climacteric states 12/04/2004  . Abdominal or pelvic swelling, mass, or lump, other specified site 12/24/2005  . Closed left hip fracture ([place]) 02/10/2018  . CKD stage 3 due to type 2 diabetes mellitus ([place]) 02/10/2018  . Hip fracture ([place]) 02/10/2018  . Pressure injury of skin 02/10/2018  . Type 2 diabetes mellitus with complication, without long-term current use of insulin ([place]) 02/15/2018  . Closed fracture of femur, intertrochanteric, right, initial encounter ([place]) 08/19/2018  . Diabetic retinopathy ([place]) 11/09/2018  . Hyperlipidemia associated with type 2 diabetes mellitus ([place]) 05/14/2019  . Acquired hypothyroidism 08/15/2019  . Acute ischemic right MCA stroke [facility]) s/p tPA & attempted revascularization, embolic stroke, source unknown 03/24/2020  . 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 AKI (acute kidney injury) ([place])   . UTI (urinary tract infection), bacterial   . Urinary tract infection with hematuria   . Dysphagia due to recent cerebral infarction 04/11/2020  . Slow transit constipation   . Hyperlipidemia LDL goal <70   . Hemorrhagic cystitis   . Benign essential  HTN   . 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 Uncontrolled type 2 diabetes mellitus with hyperglycemia ([place])   . Dysphagia, post-stroke   . Labile blood pressure   . Left hemiplegia ([place])   . Spastic hemiplegia affecting nondominant side ([place])   . Stage 3a chronic kidney disease ([place])   . Essential hypertension   . 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 Paroxysmal atrial fibrillation ([place]) 09/02/2020  . Secondary hypercoagulable state ([place]) 09/02/2020   Resolved Ambulatory Problems     Diagnosis Date Noted  . Diabetes ([place])   . Chest pain 02/19/2016  . Hyperlipidemia, mixed 03/01/2000  . Controlled type 2 diabetes mellitus with hyperglycemia ([place]) 02/10/2018  . Acute respiratory failure [facility])    Past Medical History:  Diagnosis Date  . Arthritis   . Cataract   . Diabetes mellitus   . Hypertension   . Hypertensive retinopathy   . Hypothyroidism   . Osteoporosis    SOCIAL HX:  Social History   Tobacco Use  . Smoking status: Never Smoker  . Smokeless tobacco: Never Used  Substance Use Topics  . Alcohol use: No   FAMILY HX:  Family History  Problem Relation Age of Onset  . Heart disease Mother   . Breast cancer Mother        73's  . Stroke Father   . Heart disease Father   . Healthy Daughter   . Healthy Son   . Liver disease Maternal Grandmother        gall stones imbedded in liver - blead to death when in surgery   . Pneumonia Maternal Grandfather   . Diabetes Paternal Grandmother        ALLERGIES:  Allergies  Allergen Reactions  . Semaglutide Nausea And Vomiting    PO Rybelsus  . Farxiga [Dapagliflozin]     Yeast infections  . Penicillins Itching and Rash    Has tolerated Cefazlin and Ceftriaxone in past      PERTINENT MEDICATIONS:  Outpatient Encounter Medications as of 11/05/2020  Medication Sig  . acetaminophen (TYLENOL) 325 MG tablet Take 2 tablets (650 mg total) by mouth every 4 (four) hours as needed for mild pain (or temp > 37.5 C (99.5 F)).  . Amino Acids-Protein Hydrolys (PRO-STAT) LIQD Take 1 Dose by mouth daily.  Marland Kitchen apixaban (ELIQUIS) 5 MG TABS tablet Take 1 tablet (5 mg total) by mouth 2 (two) times daily.  Marland Kitchen ascorbic acid (VITAMIN C) 500 MG tablet Take 1 tablet by mouth in the morning and at bedtime.  . bisacodyl (DULCOLAX) 5 MG EC tablet Take 5 mg by mouth daily as needed for moderate constipation.  Marland Kitchen ezetimibe (ZETIA) 10 MG tablet Take 1 tablet (10 mg total) by mouth daily.  Marland Kitchen gabapentin (NEURONTIN) 100 MG capsule Take 100 mg  by mouth at bedtime.   . ibandronate (BONIVA) 3 MG/3ML SOLN injection Inject 3 mg into the vein once. Monthly on the 25th.  . insulin glargine (LANTUS) 100 UNIT/ML injection Inject 25 Units into the skin daily.  Marland Kitchen levothyroxine (SYNTHROID) 175 MCG tablet Take 1 tablet by mouth daily.  . metFORMIN (GLUCOPHAGE) 500 MG tablet Take 1 tablet (500 mg total) by mouth daily with breakfast.  . metoprolol tartrate (LOPRESSOR) 25 MG tablet Take 1 tablet (25 mg total) by mouth 2 (two) times daily.  . Multiple Vitamin (MULTIVITAMIN WITH MINERALS) TABS tablet Take 1 tablet by mouth daily.  . pantoprazole (PROTONIX)  40 MG tablet Take 1 tablet (40 mg total) by mouth daily.  . polyethylene glycol (MIRALAX / GLYCOLAX) 17 g packet Take 17 g by mouth daily.  . pravastatin (PRAVACHOL) 40 MG tablet Take 1 tablet (40 mg total) by mouth daily at 6 PM.  . tiZANidine (ZANAFLEX) 2 MG tablet Take 0.5 tablets (1 mg total) by mouth 2 (two) times daily. (Patient taking differently: Take 2 mg by mouth 2 (two) times daily.)   No facility-administered encounter medications on file as of 11/05/2020.    Objective: Deferred  Thank you for the opportunity to participate in the care of Ms. Musselman.  The palliative care team will continue to follow. Please call our office at [phone] if we can be of additional assistance.  Jason Coop, NP , DNP, MPH, AGPCNP-BC, ACHPN  COVID-19 PATIENT SCREENING TOOL  Person answering questions: ____________staff______ _____   1.  Is the patient or any family member in the home showing any signs or symptoms regarding respiratory infection?               Person with Symptom- __________NA_________________  a. Fever                                                                          Yes___ No___          ___________________  b. Shortness of breath                                                    Yes___ No___          ___________________ c. Cough/congestion                                        Yes___  No___         ___________________ d. Body aches/pains                                                         Yes___ No___        ____________________ e. Gastrointestinal symptoms (diarrhea, nausea)           Yes___ No___        ____________________  2. Within the past 14 days, has anyone living in the home had any contact with someone with or under investigation for COVID-19?    Yes___ No_X_   Person __________________

## 2020-11-06 NOTE — Telephone Encounter (Signed)
Spoke to dtr. (pt currently palliative care, living in a facility, poor prognosis) Explained that d/t current pt status and that pt is already on a blood thinner we will continue to monitor, as opposed to bringing pt in the office. Dtr relieved to hear this and agreeable to plan.

## 2020-11-10 NOTE — Progress Notes (Signed)
Carelink Summary Report / Loop Recorder 

## 2020-11-12 ENCOUNTER — Telehealth: Payer: Self-pay

## 2020-11-12 NOTE — Telephone Encounter (Signed)
Patient called and advised Dr. Elberta Fortis would like patient to follow up at AF clinic. Notes seen patient is currently followed by AF clinic and per daughter on [place] [name]), there is no further treatment at this time. States they will call back if they have further questions or concerns. Noted that patient is currently taking Eliquis 5 mg BID.

## 2020-11-17 ENCOUNTER — Other Ambulatory Visit: Payer: Self-pay

## 2020-11-17 ENCOUNTER — Emergency Department

## 2020-11-17 ENCOUNTER — Inpatient Hospital Stay
Admission: EM | Admit: 2020-11-17 | Discharge: 2020-11-22 | DRG: 871 | Disposition: A | Discharge status: Hospice | Attending: Internal Medicine | Admitting: Internal Medicine

## 2020-11-17 DIAGNOSIS — I10 Essential (primary) hypertension: Secondary | ICD-10-CM | POA: Diagnosis not present

## 2020-11-17 DIAGNOSIS — D638 Anemia in other chronic diseases classified elsewhere: Secondary | ICD-10-CM | POA: Diagnosis present

## 2020-11-17 DIAGNOSIS — Z96653 Presence of artificial knee joint, bilateral: Secondary | ICD-10-CM | POA: Diagnosis present

## 2020-11-17 DIAGNOSIS — Z66 Do not resuscitate: Secondary | ICD-10-CM | POA: Diagnosis present

## 2020-11-17 DIAGNOSIS — Z880 Allergy status to penicillin: Secondary | ICD-10-CM

## 2020-11-17 DIAGNOSIS — Z87442 Personal history of urinary calculi: Secondary | ICD-10-CM

## 2020-11-17 DIAGNOSIS — Z9841 Cataract extraction status, right eye: Secondary | ICD-10-CM

## 2020-11-17 DIAGNOSIS — E860 Dehydration: Secondary | ICD-10-CM | POA: Diagnosis present

## 2020-11-17 DIAGNOSIS — M1990 Unspecified osteoarthritis, unspecified site: Secondary | ICD-10-CM | POA: Diagnosis present

## 2020-11-17 DIAGNOSIS — Z20822 Contact with and (suspected) exposure to covid-19: Secondary | ICD-10-CM | POA: Diagnosis present

## 2020-11-17 DIAGNOSIS — N3000 Acute cystitis without hematuria: Secondary | ICD-10-CM | POA: Diagnosis not present

## 2020-11-17 DIAGNOSIS — Z794 Long term (current) use of insulin: Secondary | ICD-10-CM | POA: Diagnosis not present

## 2020-11-17 DIAGNOSIS — I129 Hypertensive chronic kidney disease with stage 1 through stage 4 chronic kidney disease, or unspecified chronic kidney disease: Secondary | ICD-10-CM | POA: Diagnosis present

## 2020-11-17 DIAGNOSIS — E785 Hyperlipidemia, unspecified: Secondary | ICD-10-CM | POA: Diagnosis present

## 2020-11-17 DIAGNOSIS — E43 Unspecified severe protein-calorie malnutrition: Secondary | ICD-10-CM | POA: Diagnosis present

## 2020-11-17 DIAGNOSIS — E118 Type 2 diabetes mellitus with unspecified complications: Secondary | ICD-10-CM | POA: Diagnosis present

## 2020-11-17 DIAGNOSIS — Z9049 Acquired absence of other specified parts of digestive tract: Secondary | ICD-10-CM

## 2020-11-17 DIAGNOSIS — D75839 Thrombocytosis, unspecified: Secondary | ICD-10-CM | POA: Diagnosis not present

## 2020-11-17 DIAGNOSIS — N1831 Chronic kidney disease, stage 3a: Secondary | ICD-10-CM | POA: Diagnosis present

## 2020-11-17 DIAGNOSIS — Z79890 Hormone replacement therapy (postmenopausal): Secondary | ICD-10-CM

## 2020-11-17 DIAGNOSIS — E1122 Type 2 diabetes mellitus with diabetic chronic kidney disease: Secondary | ICD-10-CM | POA: Diagnosis present

## 2020-11-17 DIAGNOSIS — Z8249 Family history of ischemic heart disease and other diseases of the circulatory system: Secondary | ICD-10-CM

## 2020-11-17 DIAGNOSIS — Z7901 Long term (current) use of anticoagulants: Secondary | ICD-10-CM

## 2020-11-17 DIAGNOSIS — Z961 Presence of intraocular lens: Secondary | ICD-10-CM | POA: Diagnosis present

## 2020-11-17 DIAGNOSIS — Z823 Family history of stroke: Secondary | ICD-10-CM

## 2020-11-17 DIAGNOSIS — G40909 Epilepsy, unspecified, not intractable, without status epilepticus: Secondary | ICD-10-CM | POA: Diagnosis present

## 2020-11-17 DIAGNOSIS — T383X5A Adverse effect of insulin and oral hypoglycemic [antidiabetic] drugs, initial encounter: Secondary | ICD-10-CM | POA: Diagnosis not present

## 2020-11-17 DIAGNOSIS — E890 Postprocedural hypothyroidism: Secondary | ICD-10-CM | POA: Diagnosis present

## 2020-11-17 DIAGNOSIS — I69359 Hemiplegia and hemiparesis following cerebral infarction affecting unspecified side: Secondary | ICD-10-CM | POA: Diagnosis not present

## 2020-11-17 DIAGNOSIS — E160 Drug-induced hypoglycemia without coma: Secondary | ICD-10-CM | POA: Diagnosis not present

## 2020-11-17 DIAGNOSIS — N183 Chronic kidney disease, stage 3 unspecified: Secondary | ICD-10-CM | POA: Diagnosis not present

## 2020-11-17 DIAGNOSIS — Z79899 Other long term (current) drug therapy: Secondary | ICD-10-CM

## 2020-11-17 DIAGNOSIS — I69354 Hemiplegia and hemiparesis following cerebral infarction affecting left non-dominant side: Secondary | ICD-10-CM | POA: Diagnosis not present

## 2020-11-17 DIAGNOSIS — N3001 Acute cystitis with hematuria: Secondary | ICD-10-CM | POA: Diagnosis present

## 2020-11-17 DIAGNOSIS — Z888 Allergy status to other drugs, medicaments and biological substances status: Secondary | ICD-10-CM

## 2020-11-17 DIAGNOSIS — F0390 Unspecified dementia without behavioral disturbance: Secondary | ICD-10-CM

## 2020-11-17 DIAGNOSIS — G9341 Metabolic encephalopathy: Secondary | ICD-10-CM | POA: Diagnosis present

## 2020-11-17 DIAGNOSIS — R569 Unspecified convulsions: Secondary | ICD-10-CM | POA: Diagnosis not present

## 2020-11-17 DIAGNOSIS — R1310 Dysphagia, unspecified: Secondary | ICD-10-CM | POA: Diagnosis present

## 2020-11-17 DIAGNOSIS — Z515 Encounter for palliative care: Secondary | ICD-10-CM | POA: Diagnosis not present

## 2020-11-17 DIAGNOSIS — M810 Age-related osteoporosis without current pathological fracture: Secondary | ICD-10-CM | POA: Diagnosis present

## 2020-11-17 DIAGNOSIS — Z9851 Tubal ligation status: Secondary | ICD-10-CM

## 2020-11-17 DIAGNOSIS — A419 Sepsis, unspecified organism: Secondary | ICD-10-CM | POA: Diagnosis present

## 2020-11-17 DIAGNOSIS — I480 Paroxysmal atrial fibrillation: Secondary | ICD-10-CM | POA: Diagnosis present

## 2020-11-17 DIAGNOSIS — E119 Type 2 diabetes mellitus without complications: Secondary | ICD-10-CM

## 2020-11-17 DIAGNOSIS — Z6824 Body mass index (BMI) 24.0-24.9, adult: Secondary | ICD-10-CM

## 2020-11-17 DIAGNOSIS — H35039 Hypertensive retinopathy, unspecified eye: Secondary | ICD-10-CM | POA: Diagnosis present

## 2020-11-17 DIAGNOSIS — Z90710 Acquired absence of both cervix and uterus: Secondary | ICD-10-CM

## 2020-11-17 DIAGNOSIS — R627 Adult failure to thrive: Secondary | ICD-10-CM | POA: Diagnosis present

## 2020-11-17 DIAGNOSIS — I69391 Dysphagia following cerebral infarction: Secondary | ICD-10-CM | POA: Diagnosis not present

## 2020-11-17 DIAGNOSIS — E11649 Type 2 diabetes mellitus with hypoglycemia without coma: Secondary | ICD-10-CM | POA: Diagnosis present

## 2020-11-17 DIAGNOSIS — Z96643 Presence of artificial hip joint, bilateral: Secondary | ICD-10-CM | POA: Diagnosis present

## 2020-11-17 DIAGNOSIS — N390 Urinary tract infection, site not specified: Secondary | ICD-10-CM

## 2020-11-17 LAB — URINALYSIS, COMPLETE (UACMP) WITH MICROSCOPIC
Bilirubin Urine: NEGATIVE
Glucose, UA: NEGATIVE mg/dL
Ketones, ur: NEGATIVE mg/dL
Nitrite: NEGATIVE
Protein, ur: 30 mg/dL — AB
Specific Gravity, Urine: 1.02 (ref 1.005–1.030)
pH: 5 (ref 5.0–8.0)

## 2020-11-17 LAB — CBC WITH DIFFERENTIAL/PLATELET
Abs Immature Granulocytes: 0.15 10*3/uL — ABNORMAL HIGH (ref 0.00–0.07)
Basophils Absolute: 0 10*3/uL (ref 0.0–0.1)
Basophils Relative: 0 %
Eosinophils Absolute: 0 10*3/uL (ref 0.0–0.5)
Eosinophils Relative: 0 %
HCT: 27.5 % — ABNORMAL LOW (ref 36.0–46.0)
Hemoglobin: 8.2 g/dL — ABNORMAL LOW (ref 12.0–15.0)
Immature Granulocytes: 1 %
Lymphocytes Relative: 6 %
Lymphs Abs: 1.2 10*3/uL (ref 0.7–4.0)
MCH: 26.3 pg (ref 26.0–34.0)
MCHC: 29.8 g/dL — ABNORMAL LOW (ref 30.0–36.0)
MCV: 88.1 fL (ref 80.0–100.0)
Monocytes Absolute: 0.5 10*3/uL (ref 0.1–1.0)
Monocytes Relative: 3 %
Neutro Abs: 17.3 10*3/uL — ABNORMAL HIGH (ref 1.7–7.7)
Neutrophils Relative %: 90 %
Platelets: 492 10*3/uL — ABNORMAL HIGH (ref 150–400)
RBC: 3.12 MIL/uL — ABNORMAL LOW (ref 3.87–5.11)
RDW: 14.4 % (ref 11.5–15.5)
WBC: 19.3 10*3/uL — ABNORMAL HIGH (ref 4.0–10.5)
nRBC: 0 % (ref 0.0–0.2)

## 2020-11-17 LAB — COMPREHENSIVE METABOLIC PANEL
ALT: 7 U/L (ref 0–44)
AST: 14 U/L — ABNORMAL LOW (ref 15–41)
Albumin: 1.7 g/dL — ABNORMAL LOW (ref 3.5–5.0)
Alkaline Phosphatase: 89 U/L (ref 38–126)
Anion gap: 10 (ref 5–15)
BUN: 38 mg/dL — ABNORMAL HIGH (ref 8–23)
CO2: 25 mmol/L (ref 22–32)
Calcium: 9 mg/dL (ref 8.9–10.3)
Chloride: 107 mmol/L (ref 98–111)
Creatinine, Ser: 1.07 mg/dL — ABNORMAL HIGH (ref 0.44–1.00)
GFR, Estimated: 55 mL/min — ABNORMAL LOW (ref >60)
Glucose, Bld: 89 mg/dL (ref 70–99)
Potassium: 3.6 mmol/L (ref 3.5–5.1)
Sodium: 142 mmol/L (ref 135–145)
Total Bilirubin: 0.4 mg/dL (ref 0.3–1.2)
Total Protein: 5.9 g/dL — ABNORMAL LOW (ref 6.5–8.1)

## 2020-11-17 LAB — GLUCOSE, CAPILLARY: Glucose-Capillary: 38 mg/dL — CL (ref 70–99)

## 2020-11-17 LAB — LIPASE, BLOOD: Lipase: 22 U/L (ref 11–51)

## 2020-11-17 MED ORDER — SENNOSIDES-DOCUSATE SODIUM 8.6-50 MG PO TABS: 1.0000 | ORAL_TABLET | Freq: Every evening | ORAL | Status: DC | PRN

## 2020-11-17 MED ORDER — LORAZEPAM 2 MG/ML IJ SOLN
1.0000 mg | INTRAMUSCULAR | Status: DC | PRN
  Administered 2020-11-20: 2 mg via INTRAVENOUS
  Filled 2020-11-17: qty 1

## 2020-11-17 MED ORDER — SODIUM CHLORIDE 0.9 % IV SOLN: INTRAVENOUS | Status: DC

## 2020-11-17 MED ORDER — SODIUM CHLORIDE 0.9 % IV BOLUS
1000.0000 mL | Freq: Once | INTRAVENOUS | Status: AC
  Administered 2020-11-17: 1000 mL via INTRAVENOUS

## 2020-11-17 MED ORDER — LEVOTHYROXINE SODIUM 50 MCG PO TABS
175.0000 ug | ORAL_TABLET | Freq: Every day | ORAL | Status: DC
  Administered 2020-11-18 – 2020-11-21 (×2): 175 ug via ORAL
  Filled 2020-11-17 (×3): qty 1

## 2020-11-17 MED ORDER — SODIUM CHLORIDE 0.9 % IV SOLN: 75.0000 mL/h | INTRAVENOUS | Status: DC

## 2020-11-17 MED ORDER — SODIUM CHLORIDE 0.9 % IV SOLN: 1.0000 g | INTRAVENOUS | Status: DC

## 2020-11-17 MED ORDER — SODIUM CHLORIDE 0.9 % IV SOLN
250.0000 mg | Freq: Once | INTRAVENOUS | Status: DC
  Filled 2020-11-17 (×2): qty 2.5

## 2020-11-17 MED ORDER — INSULIN ASPART 100 UNIT/ML ~~LOC~~ SOLN: 0.0000 [IU] | Freq: Three times a day (TID) | SUBCUTANEOUS | Status: DC

## 2020-11-17 MED ORDER — SODIUM CHLORIDE 0.9 % IV SOLN
250.0000 mg | Freq: Two times a day (BID) | INTRAVENOUS | Status: DC
  Administered 2020-11-17 – 2020-11-18 (×2): 250 mg via INTRAVENOUS
  Filled 2020-11-17 (×4): qty 2.5

## 2020-11-17 MED ORDER — SODIUM CHLORIDE 0.9 % IV SOLN
1.0000 g | INTRAVENOUS | Status: DC
  Administered 2020-11-17 – 2020-11-20 (×4): 1 g via INTRAVENOUS
  Filled 2020-11-17 (×2): qty 10
  Filled 2020-11-17: qty 1
  Filled 2020-11-17 (×2): qty 10

## 2020-11-17 MED ORDER — ONDANSETRON HCL 4 MG PO TABS: 4.0000 mg | ORAL_TABLET | Freq: Four times a day (QID) | ORAL | Status: DC | PRN

## 2020-11-17 MED ORDER — INSULIN ASPART 100 UNIT/ML ~~LOC~~ SOLN: 0.0000 [IU] | Freq: Every day | SUBCUTANEOUS | Status: DC

## 2020-11-17 MED ORDER — PRAVASTATIN SODIUM 40 MG PO TABS
40.0000 mg | ORAL_TABLET | Freq: Every day | ORAL | Status: DC
Start: 2020-11-18 — End: 2020-11-21
  Administered 2020-11-18: 40 mg via ORAL
  Filled 2020-11-17: qty 1

## 2020-11-17 MED ORDER — METOPROLOL TARTRATE 25 MG PO TABS
25.0000 mg | ORAL_TABLET | Freq: Two times a day (BID) | ORAL | Status: DC
  Administered 2020-11-17 – 2020-11-21 (×5): 25 mg via ORAL
  Filled 2020-11-17 (×7): qty 1

## 2020-11-17 MED ORDER — ONDANSETRON HCL 4 MG/2ML IJ SOLN: 4.0000 mg | Freq: Four times a day (QID) | INTRAMUSCULAR | Status: DC | PRN

## 2020-11-17 MED ORDER — SODIUM CHLORIDE 0.9 % IV SOLN
2.0000 g | Freq: Once | INTRAVENOUS | Status: AC
  Administered 2020-11-17: 2 g via INTRAVENOUS
  Filled 2020-11-17: qty 20

## 2020-11-17 MED ORDER — SODIUM CHLORIDE 0.9 % IV BOLUS
500.0000 mL | Freq: Once | INTRAVENOUS | Status: AC
  Administered 2020-11-17: 500 mL via INTRAVENOUS

## 2020-11-17 MED ORDER — APIXABAN 5 MG PO TABS
5.0000 mg | ORAL_TABLET | Freq: Two times a day (BID) | ORAL | Status: DC
  Administered 2020-11-17 – 2020-11-19 (×4): 5 mg via ORAL
  Filled 2020-11-17 (×6): qty 1

## 2020-11-17 NOTE — Consult Note (Signed)
MEDICATION RELATED CONSULT NOTE - INITIAL   Pharmacy Consult for AED DDI monitoring Indication: seizure like activity on admission  Allergies  Allergen Reactions  . Semaglutide Nausea And Vomiting    PO Rybelsus  . Farxiga [Dapagliflozin]     Yeast infections  . Penicillins Itching and Rash    Has tolerated Cefazlin and Ceftriaxone in past     Patient Measurements:  TBW 10/30/2020 - 63.8 kg  Vital Signs: Temp: 98.4 F (36.9 C) (02/07 2220) Temp Source: Oral (02/07 2220) BP: 111/61 (02/07 2220) Pulse Rate: 91 (02/07 2220) Intake/Output from previous day: No intake/output data recorded. Intake/Output from this shift: No intake/output data recorded.  Labs: Recent Labs    11/17/20 1510  WBC 19.3*  HGB 8.2*  HCT 27.5*  PLT 492*  CREATININE 1.07*  ALBUMIN 1.7*  PROT 5.9*  AST 14*  ALT 7  ALKPHOS 89  BILITOT 0.4   CrCl cannot be calculated (Unknown ideal weight.).   Microbiology: No results found for this or any previous visit (from the past 720 hour(s)).  Medical History: Past Medical History:  Diagnosis Date  . Arthritis    OA AND PAIN BOTH KNEES -RIGHT KNEE HURTS WORSE THAN LEFT  . Cataract   . Diabetes mellitus    ORAL MEDICATION - NO INSULIN  . Hypertension   . Hypertensive retinopathy    OU  . Hypothyroidism   . Kidney stones    NONE AT PRESENT TIME THAT PT IS AWARE OF  . Osteoporosis   . Paroxysmal atrial fibrillation (H[facility]) 08/25/2020    Medications: PTA:  - N/A  Inpatient: - Keppra 250mg  x1; f/b 250mg  q12h - Lorazepam 1-2mg  IV q2h for breakthrough seizures  Assessment: 75yo female with h/o HTN, Afib, DM, stroke (June 2021), & dementia presenting with worsening confusion and weakness. Family reported seizure-like activity and patient is undergoing further work-up. Pharmacy consulted to monitor for AED DDI's while inpatient.  Plan:  No current interactions identified. Will continue monitor.  75yo Meredith Shaw 11/17/2020,11:09 PM

## 2020-11-17 NOTE — ED Provider Notes (Signed)
[facility] Emergency Department Provider Note  ____________________________________________  Time seen: Approximately 2:53 PM  I have reviewed the triage vital signs and the nursing notes.   HISTORY  Chief Complaint Altered Mental Status    Level 5 Caveat: Portions of the History and Physical including HPI and review of systems are unable to be completely obtained due to patient being a poor historian   HPI Meredith Shaw is a 75 y.o. female with a history of hypertension, atrial fibrillation, diabetes, dementia who is brought to the ED from [facility] due to worsening confusion and declining energy level.  Report from EMS is that patient had a stroke in June, and has had gradual deterioration in her health since then.  Family reported some recent seizure-like activity.  She was evaluated by the nurse practitioner at [facility] but family wanted her transferred to ED for further evaluation.  The patient has a MOST form which was signed by her daughter/POA Ovidio Kin which states comfort measures only, no antibiotics no IV fluids or feeding tube.  However, I did discuss with [name] and she would like her mother evaluated and would like to proceed with IV fluids for hydration at this time.      Past Medical History:  Diagnosis Date  . Arthritis    OA AND PAIN BOTH KNEES -RIGHT KNEE HURTS WORSE THAN LEFT  . Cataract   . Diabetes mellitus    ORAL MEDICATION - NO INSULIN  . Hypertension   . Hypertensive retinopathy    OU  . Hypothyroidism   . Kidney stones    NONE AT PRESENT TIME THAT PT IS AWARE OF  . Osteoporosis   . Paroxysmal atrial fibrillation (HCC) 08/25/2020     Patient Active Problem List   Diagnosis Date Noted  . Paroxysmal atrial fibrillation (HCC) 09/02/2020  . Secondary hypercoagulable state (HCC) 09/02/2020  . 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 Essential  hypertension   . Stage 3a chronic kidney disease (HCC)   . Left hemiplegia (HCC)   . Spastic hemiplegia affecting nondominant side (HCC)   . Uncontrolled type 2 diabetes mellitus with hyperglycemia (HCC)   . Dysphagia, post-stroke   . Labile blood pressure   . Dysphagia due to recent cerebral infarction 04/11/2020  . 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 Slow transit constipation   . Hyperlipidemia LDL goal <70   . Hemorrhagic cystitis   . Benign essential HTN   . UTI (urinary tract infection), bacterial   . Urinary tract infection with hematuria   . AKI (acute kidney injury) (HCC)   . Acute ischemic right MCA stroke [facility]) s/p tPA & attempted revascularization, embolic stroke, source unknown 03/24/2020  . 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 Acquired hypothyroidism 08/15/2019  . Hyperlipidemia associated with type 2 diabetes mellitus (HCC) 05/14/2019  . Diabetic retinopathy (HCC) 11/09/2018  . Closed fracture of femur, intertrochanteric, right, initial encounter (HCC) 08/19/2018  . Type 2 diabetes mellitus with complication, without long-term current use of insulin (HCC) 02/15/2018  . Closed left hip fracture (HCC) 02/10/2018  . CKD stage 3 due to type 2 diabetes mellitus (HCC) 02/10/2018  . Hip fracture (HCC) 02/10/2018  . Pressure injury of skin 02/10/2018  . Arthritis of left knee 04/09/2014  . Status post total knee replacement 04/09/2014  . Arthropathy   . Hypertension associated with type 2 diabetes mellitus (HCC)   . Kidney stones   .  Postablative hypothyroidism   . Degenerative arthritis of knee 06/09/2012  . Flatulence, eructation, and gas pain 12/24/2005  . Abdominal or pelvic swelling, mass, or lump, other specified site 12/24/2005  . Age-related osteoporosis with current pathological fracture with routine healing 12/04/2004  . Symptomatic menopausal or female climacteric states 12/04/2004  . Arthropathy, lower leg 04/21/2004  .  Hematuria 09/17/2003  . Other long term (current) drug therapy 07/05/2002     Past Surgical History:  Procedure Laterality Date  . ABDOMINAL HYSTERECTOMY    . BUBBLE STUDY  03/26/2020   Procedure: BUBBLE STUDY;  Surgeon: Lars MassonNelson, Katarina H, MD;  [facility];  Service: Cardiovascular;;  . CATARACT EXTRACTION    . CHOLECYSTECTOMY  1972  . COLONOSCOPY    . EYE SURGERY     RIGHT CATARACT EXTRACTON WITH LENS IMPLANT  . GROWTH REMOVED FROM THYROID    . INTRAMEDULLARY (IM) NAIL INTERTROCHANTERIC Left 02/10/2018   Procedure: INTRAMEDULLARY (IM) NAIL INTERTROCHANTRIC;  Surgeon: Kathryne HitchBlackman, Christopher Y, MD;  Location: [facility] OR;  Service: Orthopedics;  Laterality: Left;  . INTRAMEDULLARY (IM) NAIL INTERTROCHANTERIC Right 08/20/2018   Procedure: ORIF RIGHT HIP INTERTROC FRACTURE;  Surgeon: Kathryne HitchBlackman, Christopher Y, MD;  Location: [facility] OR;  Service: Orthopedics;  Laterality: Right;  . IR CT HEAD LTD  03/24/2020  . IR PERCUTANEOUS ART THROMBECTOMY/INFUSION INTRACRANIAL INC DIAG ANGIO  03/24/2020  . left hip surgery   02/2018  . LOOP RECORDER INSERTION N/A 04/11/2020   Procedure: LOOP RECORDER INSERTION;  Surgeon: Regan Lemmingamnitz, Will Martin, MD;  Location: [facility] INVASIVE CV LAB;  Service: Cardiovascular;  Laterality: N/A;  . ORIF HIP FRACTURE Right 08/20/2018  . PERCUTANEOUS NEPHROLITHOTOMY    . RADIOLOGY WITH ANESTHESIA N/A 03/23/2020   Procedure: IR WITH ANESTHESIA;  Surgeon: Julieanne Cottoneveshwar, Sanjeev, MD;  Location: [facility] OR;  Service: Radiology;  Laterality: N/A;  . TEE WITHOUT CARDIOVERSION N/A 03/26/2020   Procedure: TRANSESOPHAGEAL ECHOCARDIOGRAM (TEE);  Surgeon: Lars MassonNelson, Katarina H, MD;  [facility];  Service: Cardiovascular;  Laterality: N/A;  . TOTAL KNEE ARTHROPLASTY  06/09/2012   Procedure: TOTAL KNEE ARTHROPLASTY;  Surgeon: Kathryne Hitchhristopher Y Blackman, MD;  Location: [facility] ORS;  Service: Orthopedics;  Laterality: Right;  Right Total Knee Arthroplasty  . TOTAL KNEE ARTHROPLASTY Left 04/09/2014   Procedure:  LEFT TOTAL KNEE ARTHROPLASTY;  Surgeon: Kathryne Hitchhristopher Y Blackman, MD;  [facility];  Service: Orthopedics;  Laterality: Left;  . TUBAL LIGATION       Prior to Admission medications   Medication Sig Start Date End Date Taking? Authorizing Provider  acetaminophen (TYLENOL) 325 MG tablet Take 2 tablets (650 mg total) by mouth every 4 (four) hours as needed for mild pain (or temp > 37.5 C (99.5 F)). 04/11/20   Layne BentonBiby, Sharon L, NP  Amino Acids-Protein Hydrolys (PRO-STAT) LIQD Take 1 Dose by mouth daily.    [provider]  apixaban (ELIQUIS) 5 MG TABS tablet Take 1 tablet (5 mg total) by mouth 2 (two) times daily. 09/02/20   Fenton, Clint R, PA  ascorbic acid (VITAMIN C) 500 MG tablet Take 1 tablet by mouth in the morning and at bedtime.    [provider]  bisacodyl (DULCOLAX) 5 MG EC tablet Take 5 mg by mouth daily as needed for moderate constipation.    [provider]  ezetimibe (ZETIA) 10 MG tablet Take 1 tablet (10 mg total) by mouth daily. 04/12/20   Layne BentonBiby, Sharon L, NP  gabapentin (NEURONTIN) 100 MG capsule Take 100 mg by mouth at bedtime.  [provider]  ibandronate (BONIVA) 3 MG/3ML SOLN injection Inject 3 mg into the vein once. Monthly on the 25th.    [provider]  insulin glargine (LANTUS) 100 UNIT/ML injection Inject 25 Units into the skin daily.    [provider]  levothyroxine (SYNTHROID) 175 MCG tablet Take 1 tablet by mouth daily.    [provider]  metFORMIN (GLUCOPHAGE) 500 MG tablet Take 1 tablet (500 mg total) by mouth daily with breakfast. 05/06/20   Angiulli, Mcarthur Rossetti, PA-C  metoprolol tartrate (LOPRESSOR) 25 MG tablet Take 1 tablet (25 mg total) by mouth 2 (two) times daily. 04/11/20   Layne Benton, NP  Multiple Vitamin (MULTIVITAMIN WITH MINERALS) TABS tablet Take 1 tablet by mouth daily. 04/12/20   Layne Benton, NP  pantoprazole (PROTONIX) 40 MG tablet Take 1 tablet (40 mg total) by mouth daily. 04/12/20    Layne Benton, NP  polyethylene glycol (MIRALAX / GLYCOLAX) 17 g packet Take 17 g by mouth daily.    [provider]  pravastatin (PRAVACHOL) 40 MG tablet Take 1 tablet (40 mg total) by mouth daily at 6 PM. 04/11/20   Layne Benton, NP  tiZANidine (ZANAFLEX) 2 MG tablet Take 0.5 tablets (1 mg total) by mouth 2 (two) times daily. Patient taking differently: Take 2 mg by mouth 2 (two) times daily. 05/05/20   Angiulli, Mcarthur Rossetti, PA-C     Allergies Semaglutide, Marcelline Deist [dapagliflozin], and Penicillins   Family History  Problem Relation Age of Onset  . Heart disease Mother   . Breast cancer Mother        35's  . Stroke Father   . Heart disease Father   . Healthy Daughter   . Healthy Son   . Liver disease Maternal Grandmother        gall stones imbedded in liver - blead to death when in surgery   . Pneumonia Maternal Grandfather   . Diabetes Paternal Grandmother     Social History Social History   Tobacco Use  . Smoking status: Never Smoker  . Smokeless tobacco: Never Used  Vaping Use  . Vaping Use: Never used  Substance Use Topics  . Alcohol use: No  . Drug use: No    Review of Systems Level 5 Caveat: Portions of the History and Physical including HPI and review of systems are unable to be completely obtained due to patient being a poor historian   Constitutional:   No known fever.  ENT:   No rhinorrhea. Cardiovascular:   No chest pain or syncope. Respiratory:   No dyspnea or cough. Gastrointestinal:   Negative for abdominal pain, vomiting and diarrhea.  Musculoskeletal:   Negative for focal pain or swelling ____________________________________________   PHYSICAL EXAM:  VITAL SIGNS: ED Triage Vitals [11/17/20 1446]  Enc Vitals Group     BP (!) 78/50     Pulse Rate 78     Resp 18     Temp 99.2 F (37.3 C)     Temp Source Axillary     SpO2 97 %     Weight      Height      Head Circumference      [facility]      Pain Score      Pain Loc      Pain  Edu?      Excl. in GC?     Vital signs reviewed, nursing assessments reviewed.   Constitutional: Awake, oriented to  self.  Ill-appearing Eyes:   Conjunctivae are normal. EOMI. PERRL. ENT      Head:   Normocephalic and atraumatic.      Nose:   No congestion/rhinnorhea.       Mouth/Throat:   Dry mucous membranes, no pharyngeal erythema. No peritonsillar mass.       Neck:   No meningismus. Full ROM. Hematological/Lymphatic/Immunilogical:   No cervical lymphadenopathy. Cardiovascular:   RRR. Symmetric bilateral radial and DP pulses.  No murmurs. Cap refill less than 2 seconds. Respiratory:   Normal respiratory effort without tachypnea/retractions. Breath sounds are clear and equal bilaterally. No wheezes/rales/rhonchi. Gastrointestinal:   Soft and nontender. Non distended. There is no CVA tenderness.  No rebound, rigidity, or guarding. Genitourinary:   deferred Musculoskeletal:   Normal range of motion in all extremities. No joint effusions.  No lower extremity tenderness.  No edema. Neurologic:   Very limited language expression.  Motor grossly intact. No acute focal neurologic deficits are appreciated.  Skin:    Skin is warm, dry and intact. No rash noted.  No petechiae, purpura, or bullae.  ____________________________________________    LABS (pertinent positives/negatives) (all labs ordered are listed, but only abnormal results are displayed) Labs Reviewed  URINE CULTURE  COMPREHENSIVE METABOLIC PANEL  LIPASE, BLOOD  CBC WITH DIFFERENTIAL/PLATELET  URINALYSIS, COMPLETE (UACMP) WITH MICROSCOPIC   ____________________________________________   EKG  Interpreted by me  Date: 11/17/2020  Rate: 76  Rhythm: normal sinus rhythm  QRS Axis: normal  Intervals: normal  ST/T Wave abnormalities: normal  Conduction Disutrbances: none  Narrative Interpretation: unremarkable      ____________________________________________    RADIOLOGY  No results  found.  ____________________________________________   PROCEDURES Procedures  ____________________________________________  DIFFERENTIAL DIAGNOSIS   Intracranial hemorrhage, pneumonia, UTI, dehydration, electrolyte normality  CLINICAL IMPRESSION / ASSESSMENT AND PLAN / ED COURSE  Medications ordered in the ED: Medications  sodium chloride 0.9 % bolus 1,000 mL (has no administration in time range)    Pertinent labs & imaging results that were available during my care of the patient were reviewed by me and considered in my medical decision making (see chart for details).   KURSTYN NORTHRIP was evaluated in Emergency Department on 11/17/2020 for the symptoms described in the history of present illness. She was evaluated in the context of the global COVID-19 pandemic, which necessitated consideration that the patient might be at risk for infection with the SARS-CoV-2 virus that causes COVID-19. Institutional protocols and algorithms that pertain to the evaluation of patients at risk for COVID-19 are in a state of rapid change based on information released by regulatory bodies including the CDC and federal and state organizations. These policies and algorithms were followed during the patient's care in the ED.   Patient brought to ED due to altered mental status on top of chronic dementia.  Despite a signed advance directive form stating comfort measures only, daughter who is her POA would like to proceed with a evaluation, IV fluids.  I think her hypotension is due to dehydration, I do not think she has sepsis.  Will check labs chest x-ray CT head, give a saline bolus.  Care of patient will be signed out to oncoming provider at 3:00 PM.      ____________________________________________   FINAL CLINICAL IMPRESSION(S) / ED DIAGNOSES    Final diagnoses:  Dehydration  Chronic dementia without behavioral disturbance (HCC)  Paroxysmal atrial fibrillation (HCC)  Type 2 diabetes mellitus  without complication, with long-term current use of insulin (  [facility])     ED Discharge Orders    None      Portions of this note were generated with dragon dictation software. Dictation errors may occur despite best attempts at proofreading.   Sharman Cheek, MD 11/17/20 1459

## 2020-11-17 NOTE — H&P (Signed)
History and Physical    [name] XBM:[id number] DOB: [birth year] DOA: 11/17/2020  PCP: Rosetta Posner, MD   Patient coming from: [facility]  I have personally briefly reviewed patient's old medical records in [facility]  Chief Complaint: Seizure like activity  HPI: Meredith Shaw is a 75 y.o. 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  She had 2 additional episodes witnessed by her family and was thus sent to the emergency room for evaluation.  Patient is currently on hospice and family wants no aggressive measures but would like patient to be treated with supportive care ED Course: On arrival she had a low-grade temperature of 99.2, BP 78/50, pulse 78 with O2 sat 97% on room air.  Blood work significant for leukocytosis of 19,000, hemoglobin 8.2, creatinine 1.07 which is her baseline.  Urinalysis somewhat consistent with UTI.  Lipase normal at 22.  Lactic acid pending.  Covid PCR pending EKG as reviewed by me : Sinus rhythm at 76 with no acute ST-T wave changes Imaging: CT head with no acute intracranial pathology.  Shows large old right MCA territory infarct and encephalomalacia Chest x-ray with mild pulmonary vascular congestion with no other signs of pulmonary edema  Patient was bolused in the emergency room with improvement in blood pressure 201/51 by admission.  The ED provider spoke with neurology who recommended Keppra 250 twice daily.  Hospitalist consulted for admission.  Family does not want heroic measures they are agreeable to IV medications, IV fluids antibiotics and supportive care  Review of Systems: Unable to obtain due to  altered mental status/postictal   Past Medical History:  Diagnosis Date  . Arthritis    OA AND PAIN BOTH KNEES -RIGHT KNEE HURTS WORSE THAN LEFT  . Cataract   . Diabetes mellitus    ORAL MEDICATION - NO INSULIN  . Hypertension   . Hypertensive retinopathy    OU  . Hypothyroidism   . Kidney stones    NONE AT PRESENT TIME THAT PT IS AWARE OF  . Osteoporosis   . Paroxysmal atrial fibrillation (HCC) 08/25/2020    Past Surgical History:  Procedure Laterality Date  . ABDOMINAL HYSTERECTOMY    . BUBBLE STUDY  03/26/2020   Procedure: BUBBLE STUDY;  Surgeon: Lars Masson, MD;  Location[facility] ENDOSCOPY;  Service: Cardiovascular;;  . CATARACT EXTRACTION    . CHOLECYSTECTOMY  1972  . COLONOSCOPY    . EYE SURGERY     RIGHT CATARACT EXTRACTON WITH LENS IMPLANT  . GROWTH REMOVED FROM THYROID    . INTRAMEDULLARY (IM) NAIL INTERTROCHANTERIC Left 02/10/2018   Procedure: INTRAMEDULLARY (IM) NAIL INTERTROCHANTRIC;  Surgeon: Kathryne Hitch, MD;  Location: [facility] OR;  Service: Orthopedics;  Laterality: Left;  . INTRAMEDULLARY (IM) NAIL INTERTROCHANTERIC Right 08/20/2018   Procedure: ORIF RIGHT HIP INTERTROC FRACTURE;  Surgeon: Kathryne Hitch, MD;  Location: [facility] OR;  Service: Orthopedics;  Laterality: Right;  . IR CT HEAD LTD  03/24/2020  . IR PERCUTANEOUS ART THROMBECTOMY/INFUSION INTRACRANIAL INC DIAG ANGIO  03/24/2020  . left hip surgery   02/2018  . LOOP RECORDER INSERTION N/A 04/11/2020   Procedure: LOOP RECORDER INSERTION;  Surgeon: Regan Lemming, MD;  Location: [facility] INVASIVE CV LAB;  Service: Cardiovascular;  Laterality: N/A;  . ORIF HIP FRACTURE Right 08/20/2018  . PERCUTANEOUS NEPHROLITHOTOMY    . RADIOLOGY WITH ANESTHESIA N/A 03/23/2020   Procedure: IR WITH ANESTHESIA;  Surgeon: Julieanne Cotton, MD;  Location: [facility] OR;  Service: Radiology;  Laterality: N/A;  . TEE WITHOUT CARDIOVERSION N/A 03/26/2020   Procedure: TRANSESOPHAGEAL ECHOCARDIOGRAM (TEE);  Surgeon: Lars Masson, MD;  Location: [facility] ENDOSCOPY;  Service: Cardiovascular;  Laterality: N/A;  . TOTAL KNEE ARTHROPLASTY  06/09/2012   Procedure: TOTAL KNEE ARTHROPLASTY;  Surgeon: Kathryne Hitch, MD;  Location: [facility] ORS;  Service: Orthopedics;  Laterality: Right;  Right Total Knee Arthroplasty  . TOTAL KNEE ARTHROPLASTY Left 04/09/2014   Procedure: LEFT TOTAL KNEE ARTHROPLASTY;  Surgeon: Kathryne Hitch, MD;  Location: [facility] OR;  Service: Orthopedics;  Laterality: Left;  . TUBAL LIGATION       reports that she has never smoked. She has never used smokeless tobacco. She reports that she does not drink alcohol and does not use drugs.  Allergies  Allergen Reactions  . Semaglutide Nausea And Vomiting    PO Rybelsus  . Farxiga [Dapagliflozin]     Yeast infections  . Penicillins Itching and Rash    Has tolerated Cefazlin and Ceftriaxone in past     Family History  Problem Relation Age of Onset  . Heart disease Mother   . Breast cancer Mother        44's  . Stroke Father   . Heart disease Father   . Healthy Daughter   . Healthy Son   . Liver disease Maternal Grandmother        gall stones imbedded in liver - blead to death when in surgery   . Pneumonia Maternal Grandfather   . Diabetes Paternal Grandmother       Prior to Admission medications   Medication Sig Start Date End Date Taking? Authorizing Provider  acetaminophen (TYLENOL) 325 MG tablet Take 2 tablets (650 mg total) by mouth every 4 (four) hours as needed for mild pain (or temp > 37.5 C (99.5 F)). 04/11/20   Layne Benton, NP  Amino Acids-Protein Hydrolys (PRO-STAT) LIQD Take 1 Dose by mouth daily.    [provider]  apixaban (ELIQUIS) 5 MG TABS tablet Take 1 tablet (5 mg total) by mouth 2 (two) times daily. 09/02/20   Fenton, Clint R, PA  ascorbic acid (VITAMIN C) 500 MG tablet Take 1 tablet by mouth in the morning and at bedtime.    [provider]  bisacodyl (DULCOLAX) 5 MG EC tablet Take 5 mg by mouth  daily as needed for moderate constipation.    [provider]  ezetimibe (ZETIA) 10 MG tablet Take 1 tablet (10 mg total) by mouth daily. 04/12/20   Layne Benton, NP  gabapentin (NEURONTIN) 100 MG capsule Take 100 mg by mouth at bedtime.     [provider]  ibandronate (BONIVA) 3 MG/3ML SOLN injection Inject 3 mg into the vein once. Monthly on the 25th.    [provider]  insulin glargine (LANTUS) 100 UNIT/ML injection Inject 25 Units into the skin daily.    [provider]  levothyroxine (SYNTHROID) 175 MCG tablet Take 1 tablet by mouth daily.    [provider]  metFORMIN (GLUCOPHAGE) 500 MG tablet Take 1 tablet (500 mg total) by mouth daily with breakfast. 05/06/20   Angiulli, Mcarthur Rossetti, PA-C  metoprolol tartrate (LOPRESSOR) 25 MG tablet Take  1 tablet (25 mg total) by mouth 2 (two) times daily. 04/11/20   Layne Benton, NP  Multiple Vitamin (MULTIVITAMIN WITH MINERALS) TABS tablet Take 1 tablet by mouth daily. 04/12/20   Layne Benton, NP  pantoprazole (PROTONIX) 40 MG tablet Take 1 tablet (40 mg total) by mouth daily. 04/12/20   Layne Benton, NP  polyethylene glycol (MIRALAX / GLYCOLAX) 17 g packet Take 17 g by mouth daily.    [provider]  pravastatin (PRAVACHOL) 40 MG tablet Take 1 tablet (40 mg total) by mouth daily at 6 PM. 04/11/20   Layne Benton, NP  tiZANidine (ZANAFLEX) 2 MG tablet Take 0.5 tablets (1 mg total) by mouth 2 (two) times daily. Patient taking differently: Take 2 mg by mouth 2 (two) times daily. 05/05/20   Charlton Amor, PA-C    Physical Exam: Vitals:   11/17/20 1446 11/17/20 1516 11/17/20 1815 11/17/20 1900  BP: (!) 78/50 (!) 93/59 (!) 101/51 (!) 88/60  Pulse: 78 80 89 81  Resp: 18 (!) 21 (!) 21 15  Temp: 99.2 F (37.3 C)     TempSrc: Axillary     SpO2: 97% 96% (!) 88% 99%     Vitals:   11/17/20 1446 11/17/20 1516 11/17/20 1815 11/17/20 1900  BP: (!) 78/50 (!) 93/59 (!) 101/51 (!) 88/60  Pulse: 78 80  89 81  Resp: 18 (!) 21 (!) 21 15  Temp: 99.2 F (37.3 C)     TempSrc: Axillary     SpO2: 97% 96% (!) 88% 99%      Constitutional: Somnolent but arousable and oriented x 1 . Not in any apparent distress HEENT:      Head: Normocephalic and atraumatic.         Eyes: PERLA, EOMI, Conjunctivae are normal. Sclera is non-icteric.       Mouth/Throat: Mucous membranes are moist.       Neck: Supple with no signs of meningismus. Cardiovascular: Regular rate and rhythm. No murmurs, gallops, or rubs. 2+ symmetrical distal pulses are present . No JVD. No LE edema Respiratory: Respiratory effort normal .Lungs sounds clear bilaterally. No wheezes, crackles, or rhonchi.  Gastrointestinal: Soft, non tender, and non distended with positive bowel sounds.  Genitourinary: No CVA tenderness. Musculoskeletal: Nontender with normal range of motion in all extremities. No cyanosis, or erythema of extremities. Neurologic:  Face is symmetric.  Left-sided weakness  skin: Skin is warm, dry.  No rash or ulcers Psychiatric: Mood and affect are normal    Labs on Admission: I have personally reviewed following labs and imaging studies  CBC: Recent Labs  Lab 11/17/20 1510  WBC 19.3*  NEUTROABS 17.3*  HGB 8.2*  HCT 27.5*  MCV 88.1  PLT 492*   Basic Metabolic Panel: Recent Labs  Lab 11/17/20 1510  NA 142  K 3.6  CL 107  CO2 25  GLUCOSE 89  BUN 38*  CREATININE 1.07*  CALCIUM 9.0   GFR: CrCl cannot be calculated (Unknown ideal weight.). Liver Function Tests: Recent Labs  Lab 11/17/20 1510  AST 14*  ALT 7  ALKPHOS 89  BILITOT 0.4  PROT 5.9*  ALBUMIN 1.7*   Recent Labs  Lab 11/17/20 1510  LIPASE 22   No results for input(s): AMMONIA in the last 168 hours. Coagulation Profile: No results for input(s): INR, PROTIME in the last 168 hours. Cardiac Enzymes: No results for input(s): CKTOTAL, CKMB, CKMBINDEX, TROPONINI in the last 168 hours. BNP (last 3 results) No  results for input(s):  PROBNP in the last 8760 hours. HbA1C: No results for input(s): HGBA1C in the last 72 hours. CBG: No results for input(s): GLUCAP in the last 168 hours. Lipid Profile: No results for input(s): CHOL, HDL, LDLCALC, TRIG, CHOLHDL, LDLDIRECT in the last 72 hours. Thyroid Function Tests: No results for input(s): TSH, T4TOTAL, FREET4, T3FREE, THYROIDAB in the last 72 hours. Anemia Panel: No results for input(s): VITAMINB12, FOLATE, FERRITIN, TIBC, IRON, RETICCTPCT in the last 72 hours. Urine analysis:    Component Value Date/Time   COLORURINE YELLOW (A) 11/17/2020 1453   APPEARANCEUR CLOUDY (A) 11/17/2020 1453   APPEARANCEUR Clear 12/31/2017 0829   LABSPEC 1.020 11/17/2020 1453   PHURINE 5.0 11/17/2020 1453   GLUCOSEU NEGATIVE 11/17/2020 1453   HGBUR SMALL (A) 11/17/2020 1453   BILIRUBINUR NEGATIVE 11/17/2020 1453   BILIRUBINUR Negative 12/31/2017 0829   KETONESUR NEGATIVE 11/17/2020 1453   PROTEINUR 30 (A) 11/17/2020 1453   UROBILINOGEN 0.2 04/02/2014 1424   NITRITE NEGATIVE 11/17/2020 1453   LEUKOCYTESUR LARGE (A) 11/17/2020 1453    Radiological Exams on Admission: CT Head Wo Contrast  Result Date: 11/17/2020 CLINICAL DATA:  75 year old female with altered mental status. EXAM: CT HEAD WITHOUT CONTRAST TECHNIQUE: Contiguous axial images were obtained from the base of the skull through the vertex without intravenous contrast. COMPARISON:  Head CT dated 03/24/2020. FINDINGS: Brain: There is a large area of old infarct and encephalomalacia involving the right MCA territory. There is associated mild ex vacuo dilatation of the right lateral ventricle. There is no acute intracranial hemorrhage. No mass effect or midline shift. No extra-axial fluid collection. Vascular: No hyperdense vessel or unexpected calcification. Skull: Normal. Negative for fracture or focal lesion. Sinuses/Orbits: No acute finding. Other: None IMPRESSION: 1. No acute intracranial pathology. 2. Large old right MCA territory  infarct and encephalomalacia. Electronically Signed   By: Elgie CollardArash  Radparvar M.D.   On: 11/17/2020 15:39   DG Chest Portable 1 View  Result Date: 11/17/2020 CLINICAL DATA:  Weakness, history of CVA in June gradual decline, reported seizure-like activity EXAM: PORTABLE CHEST 1 VIEW COMPARISON:  Radiograph 04/05/2020 FINDINGS: Coarsened interstitial and bronchitic features are similar to comparison is. There is some mild pulmonary vascular congestion without other convincing features of edema. No pneumothorax. No visible effusion. The aorta is calcified. Loop recorder projects over the left heart. Cardiac size is grossly normal for portable technique. The remaining cardiomediastinal contours are unremarkable. No acute osseous or soft tissue abnormality. Degenerative changes are present in the imaged spine and shoulders. Telemetry leads overlie the chest. IMPRESSION: Mild pulmonary vascular congestion without other convincing features of edema. More chronically coarsened interstitial and bronchitic features are similar to priors. Electronically Signed   By: Kreg ShropshirePrice  DeHay M.D.   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    New onset seizure [facility](HCC) -Patient with history of stroke presenting with seizure-like activity x 3 witnessed by nursing home staff and husband -Keppra 250 mg IV twice daily as recommended by neurology -CT head showing old stroke and patient has no new focal deficits -Ativan as needed seizure -Spoke with the daughter at length for over 15 minutes about goals of care.  She would like to proceed with full neurologic work-up including EEG and MRI if needed as well as neurologic  consult.    UTI, failing outpatient treatment Probable  sepsis -Urinalysis with large leukocytes. -Treated for E. coli UTI with nitrofurantoin and Bactrim and IV fluids at the nursing facility as well as with -Patient had a low-grade temperature of 99, leukocytosis of 20,000 and was hypotensive which could be elements of sepsis, although leukocytosis could be related to seizure -Has associated lethargy/acute metabolic encephalopathy. 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  Holding metoprolol due to blood pressure    DVT prophylaxis: Lovenox  Code Status: full code  Family Communication:  Daughter.Spoke with the daughter at length for over 15 minutes about goals of care.  She would like to proceed with full neurologic work-up including EEG and MRI if needed as well as neurologic consult Disposition Plan: Back to previous home environment Consults called: Neurology Status:At  the time of admission, it appears that the appropriate admission status for this patient is INPATIENT. This is judged to be reasonable and necessary in order to provide the required intensity of service to ensure the patient's safety given the presenting symptoms, physical exam findings, and initial radiographic and laboratory data in the context of their  Comorbid conditions.   Patient requires inpatient status due to high intensity of service, high risk for further deterioration and high frequency of surveillance required.   I certify that at the point of admission it is my clinical judgment that the patient will require inpatient hospital care spanning beyond 2 midnights     Andris Baumann MD [facility]     11/17/2020, 8:06 PM

## 2020-11-17 NOTE — ED Notes (Signed)
Attempted to cath. Unable to visualize due to large amount of frank purulent drainage. MD notified.

## 2020-11-17 NOTE — ED Provider Notes (Addendum)
75 year old female here with generalized weakness and increasing confusion, with possible seizure-like activity at her skilled nursing facility.  Regarding her confusion, lab work shows marked leukocytosis with left shift and likely prerenal mild AKI.  She has overt purulence noted on vaginal exam with purulent urine on cath.  Nurses initially had difficulty with placement of cath due to the purulence, so it was placed by myself at bedside.  Tolerated well.  There was no apparent vaginal purulence, and this seemed to be coming from the bladder.  Urinalysis sent.  CT head, chest x-ray reviewed.  Patient given IV antibiotics, fluids.  Will admit per family request.  Of note, patient reportedly has possible seizure-like activity at her nursing facility.  It is unclear whether this was related to fever/encephalopathy or actual seizures.  CT head negative.  No seizure-like activity noted here.  I did initially discuss with neurology who felt it would be very reasonable to start her on a low-dose of Keppra such as 250 mg daily, but will defer to inpatient team at this time.   BLADDER CATHETERIZATION  Date/Time: 11/17/2020 6:51 PM Performed by: Shaune Pollack, MD Authorized by: Shaune Pollack, MD   Consent:    Consent obtained:  Verbal   Consent given by:  Healthcare agent   Risks, benefits, and alternatives were discussed: yes     Risks discussed:  False passage, incomplete procedure, infection, pain and urethral injury   Alternatives discussed:  Alternative treatment Procedure details:    Provider performed due to:  Complicated insertion   Catheter insertion:  Indwelling   Catheter type:  Foley   Catheter size:  12 Fr   Bladder irrigation: no     Number of attempts:  1   Urine characteristics:  Foul smelling Post-procedure details:    Procedure completion:  Merlene Laughter, MD 11/17/20 1850    Shaune Pollack, MD 11/17/20 1851

## 2020-11-17 NOTE — ED Triage Notes (Signed)
Sent from [facility] [employer]. Hx CVA in June. "Going downhill since." Family reports seizure like activity.

## 2020-11-17 NOTE — Progress Notes (Signed)
[facility] Room ED [address] [facility]) Hospital Liaison RN note:  This patient is a current hospice patient with [facility], contracting with a terminal diagnosis of cognitive decline related to a cerebral infarction. She is a DNR. ACC Liaison will follow tomorrow for disposition and update her ACC team.  Please call for any hospice related questions or concerns.  Cyndra Numbers, RN [facility] [phone]

## 2020-11-18 ENCOUNTER — Other Ambulatory Visit: Payer: Self-pay

## 2020-11-18 ENCOUNTER — Encounter: Payer: Self-pay | Admitting: Internal Medicine

## 2020-11-18 ENCOUNTER — Inpatient Hospital Stay

## 2020-11-18 DIAGNOSIS — E785 Hyperlipidemia, unspecified: Secondary | ICD-10-CM

## 2020-11-18 DIAGNOSIS — E160 Drug-induced hypoglycemia without coma: Secondary | ICD-10-CM

## 2020-11-18 DIAGNOSIS — D638 Anemia in other chronic diseases classified elsewhere: Secondary | ICD-10-CM

## 2020-11-18 DIAGNOSIS — R569 Unspecified convulsions: Secondary | ICD-10-CM

## 2020-11-18 DIAGNOSIS — I10 Essential (primary) hypertension: Secondary | ICD-10-CM

## 2020-11-18 DIAGNOSIS — N3001 Acute cystitis with hematuria: Secondary | ICD-10-CM

## 2020-11-18 DIAGNOSIS — T383X5A Adverse effect of insulin and oral hypoglycemic [antidiabetic] drugs, initial encounter: Secondary | ICD-10-CM

## 2020-11-18 LAB — GLUCOSE, CAPILLARY
Glucose-Capillary: 100 mg/dL — ABNORMAL HIGH (ref 70–99)
Glucose-Capillary: 56 mg/dL — ABNORMAL LOW (ref 70–99)
Glucose-Capillary: 70 mg/dL (ref 70–99)
Glucose-Capillary: 186 mg/dL — ABNORMAL HIGH (ref 70–99)
Glucose-Capillary: 193 mg/dL — ABNORMAL HIGH (ref 70–99)
Glucose-Capillary: 204 mg/dL — ABNORMAL HIGH (ref 70–99)

## 2020-11-18 LAB — SARS CORONAVIRUS 2 (TAT 6-24 HRS): SARS Coronavirus 2: NEGATIVE

## 2020-11-18 LAB — HEMOGLOBIN A1C
Hgb A1c MFr Bld: 6.6 % — ABNORMAL HIGH (ref 4.8–5.6)
Mean Plasma Glucose: 142.72 mg/dL

## 2020-11-18 LAB — FERRITIN: Ferritin: 716 ng/mL — ABNORMAL HIGH (ref 11–307)

## 2020-11-18 MED ORDER — CHLORHEXIDINE GLUCONATE CLOTH 2 % EX PADS
6.0000 | MEDICATED_PAD | Freq: Every day | CUTANEOUS | Status: DC
Start: 2020-11-18 — End: 2020-11-22
  Administered 2020-11-18 – 2020-11-20 (×3): 6 via TOPICAL

## 2020-11-18 MED ORDER — TIZANIDINE HCL 2 MG PO TABS
1.0000 mg | ORAL_TABLET | Freq: Once | ORAL | Status: AC
  Administered 2020-11-18: 1 mg via ORAL
  Filled 2020-11-18: qty 0.5

## 2020-11-18 MED ORDER — DEXTROSE 5 % IV SOLN: INTRAVENOUS | Status: DC

## 2020-11-18 MED ORDER — LEVETIRACETAM IN NACL 500 MG/100ML IV SOLN
500.0000 mg | Freq: Two times a day (BID) | INTRAVENOUS | Status: DC
  Administered 2020-11-18 – 2020-11-22 (×6): 500 mg via INTRAVENOUS
  Filled 2020-11-18 (×9): qty 100

## 2020-11-18 MED ORDER — TIZANIDINE HCL 2 MG PO TABS
1.0000 mg | ORAL_TABLET | Freq: Two times a day (BID) | ORAL | Status: DC | PRN
  Administered 2020-11-19: 1 mg via ORAL
  Filled 2020-11-18 (×3): qty 0.5

## 2020-11-18 NOTE — Progress Notes (Signed)
Patient ID: Meredith Shaw, female   DOB: October 30, 1945, 75 y.o.   MRN: [id number] [facility] PROGRESS NOTE  YARETZY OLAZABAL PYK:[id number] DOB: [birth year] DOA: 11/17/2020 PCP: Rosetta Posner, MD  HPI/Subjective: Patient is nonverbal.  She did eat with the nursing staff.  She was lifting her right arm up and dropping it down on the bed continuously.  When I grabbed her arm she was able to stop and hold my hand.  Brought in after seizure.  Was also hypoglycemic.  Objective: Vitals:   11/18/20 0913 11/18/20 1104  BP: (!) 108/57 (!) 94/57  Pulse: 93 87  Resp: 18 18  Temp: 97.6 F (36.4 C) 97.7 F (36.5 C)  SpO2: 97% 96%    Intake/Output Summary (Last 24 hours) at 11/18/2020 1336 Last data filed at 11/18/2020 1043 Gross per 24 hour  Intake 560 ml  Output 250 ml  Net 310 ml   Filed Weights   11/18/20 0346  Weight: 63.5 kg    ROS: Review of Systems  Unable to perform ROS: Patient nonverbal   Exam: Physical Exam HENT:     Head: Normocephalic.     Mouth/Throat:     Pharynx: No oropharyngeal exudate.  Eyes:     General: Lids are normal.     Conjunctiva/sclera: Conjunctivae normal.  Cardiovascular:     Rate and Rhythm: Normal rate and regular rhythm.     Heart sounds: Normal heart sounds, S1 normal and S2 normal.  Pulmonary:     Breath sounds: No decreased breath sounds, wheezing, rhonchi or rales.  Abdominal:     Palpations: Abdomen is soft.     Tenderness: There is no abdominal tenderness.  Musculoskeletal:     Right ankle: No swelling.     Left ankle: No swelling.  Skin:    General: Skin is warm.     Findings: No rash.  Neurological:     Mental Status: She is alert.     Comments: Patient lifting up right arm and slamming it down on the bed repeatedly.  When I grabbed her hand she was able to hold and stop with the arm motions.  Left sided paralysis.       Data Reviewed: Basic Metabolic Panel: Recent Labs  Lab 11/17/20 1510  NA 142  K 3.6  CL 107  CO2  25  GLUCOSE 89  BUN 38*  CREATININE 1.07*  CALCIUM 9.0   Liver Function Tests: Recent Labs  Lab 11/17/20 1510  AST 14*  ALT 7  ALKPHOS 89  BILITOT 0.4  PROT 5.9*  ALBUMIN 1.7*   Recent Labs  Lab 11/17/20 1510  LIPASE 22   CBC: Recent Labs  Lab 11/17/20 1510  WBC 19.3*  NEUTROABS 17.3*  HGB 8.2*  HCT 27.5*  MCV 88.1  PLT 492*    CBG: Recent Labs  Lab 11/17/20 2216 11/18/20 0107 11/18/20 0911 11/18/20 0942 11/18/20 1147  GLUCAP 38* 100* 56* 70 186*    Recent Results (from the past 240 hour(s))  Blood culture (single)     Status: None (Preliminary result)   Collection Time: 11/17/20  6:39 PM   Specimen: BLOOD  Result Value Ref Range Status   Specimen Description BLOOD RIGHT ANTECUBITAL  Final   Special Requests   Final    BOTTLES DRAWN AEROBIC AND ANAEROBIC Blood Culture adequate volume   Culture   Final    NO GROWTH < 12 HOURS Performed at [facility], [address] [place], [place] [address]  Report Status PENDING  Incomplete  SARS CORONAVIRUS 2 (TAT 6-24 HRS) Nasopharyngeal Nasopharyngeal Swab     Status: None   Collection Time: 11/17/20  6:50 PM   Specimen: Nasopharyngeal Swab  Result Value Ref Range Status   SARS Coronavirus 2 NEGATIVE NEGATIVE Final    Comment: (NOTE) SARS-CoV-2 target nucleic acids are NOT DETECTED.  The SARS-CoV-2 RNA is generally detectable in upper and lower respiratory specimens during the acute phase of infection. Negative results do not preclude SARS-CoV-2 infection, do not rule out co-infections with other pathogens, and should not be used as the sole basis for treatment or other patient management decisions. Negative results must be combined with clinical observations, patient history, and epidemiological information. The expected result is Negative.  Fact Sheet for Patients: HairSlick.no  Fact Sheet for Healthcare  Providers: quierodirigir.com  This test is not yet approved or cleared by the [place] FDA and  has been authorized for detection and/or diagnosis of SARS-CoV-2 by FDA under an Emergency Use Authorization (EUA). This EUA will remain  in effect (meaning this test can be used) for the duration of the COVID-19 declaration under Se ction 564(b)(1) of the Act, 21 U.S.C. section 360bbb-3(b)(1), unless the authorization is terminated or revoked sooner.  Performed at [facility], [address], [place], [place] [address]      Studies: CT Head Wo Contrast  Result Date: 11/17/2020 CLINICAL DATA:  75 year old female with altered mental status. EXAM: CT HEAD WITHOUT CONTRAST TECHNIQUE: Contiguous axial images were obtained from the base of the skull through the vertex without intravenous contrast. COMPARISON:  Head CT dated 03/24/2020. FINDINGS: Brain: There is a large area of old infarct and encephalomalacia involving the right MCA territory. There is associated mild ex vacuo dilatation of the right lateral ventricle. There is no acute intracranial hemorrhage. No mass effect or midline shift. No extra-axial fluid collection. Vascular: No hyperdense vessel or unexpected calcification. Skull: Normal. Negative for fracture or focal lesion. Sinuses/Orbits: No acute finding. Other: None IMPRESSION: 1. No acute intracranial pathology. 2. 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 Electronically Signed   By: Elgie Collard M.D.   On: 11/17/2020 15:39   MR BRAIN WO CONTRAST  Result Date: 11/18/2020 CLINICAL DATA:  75 year old female with altered mental status. Seizure like activity. Previous right MCA infarct. Atrial fibrillation on Eliquis. EXAM: MRI HEAD WITHOUT CONTRAST TECHNIQUE: Multiplanar, multiecho pulse sequences of the brain and surrounding structures were obtained without intravenous contrast. COMPARISON:  Head CT 11/17/2020.  Brain MRI 03/25/2020.  FINDINGS: Brain: Expected evolution of the relatively large right MCA territory infarct from June of last year. Encephalomalacia with areas of laminar necrosis and hemosiderin now. Ex vacuo enlargement of the right lateral ventricle. Associated Wallerian degeneration. No restricted diffusion to suggest acute infarction. No midline shift, mass effect, evidence of mass lesion, ventriculomegaly, extra-axial collection or acute intracranial hemorrhage. Cervicomedullary junction and pituitary are within normal limits. Outside of the right MCA territory patchy and confluent white matter T2 and FLAIR hyperintensity has mildly progressed from last year, including a small area of possible cortical encephalomalacia in the left anterior frontal lobe on series 12, image 18. Chronic lacunar infarcts of the left thalamus are stable. Small chronic infarcts in the right cerebellum have not definitely progressed. No chronic hemorrhage outside of the right MCA territory. Thin coronal imaging does not suggest mesial temporal sclerosis. Vascular: Major intracranial vascular flow voids appear stable since last year. Skull and upper cervical spine: Stable visible  cervical spine degeneration. Normal bone marrow signal. Sinuses/Orbits: Stable, negative. Other: Visible internal auditory structures appear normal. Mastoids remain clear. IMPRESSION: 1. No acute intracranial abnormality. 2. Expected evolution of the large right MCA infarct from June of last year. Associated encephalomalacia, laminar necrosis, hemosiderin. Mild progression of small vessel infarcts elsewhere since that time. Electronically Signed   By: Odessa Fleming M.D.   On: 11/18/2020 08:59   DG Chest Portable 1 View  Result Date: 11/17/2020 CLINICAL DATA:  Weakness, history of CVA in June gradual decline, reported seizure-like activity EXAM: PORTABLE CHEST 1 VIEW COMPARISON:  Radiograph 04/05/2020 FINDINGS: Coarsened interstitial and bronchitic features are similar to comparison  is. There is some mild pulmonary vascular congestion without other convincing features of edema. No pneumothorax. No visible effusion. The aorta is calcified. Loop recorder projects over the left heart. Cardiac size is grossly normal for portable technique. The remaining cardiomediastinal contours are unremarkable. No acute osseous or soft tissue abnormality. Degenerative changes are present in the imaged spine and shoulders. Telemetry leads overlie the chest. IMPRESSION: Mild pulmonary vascular congestion without other convincing features of edema. More chronically coarsened interstitial and bronchitic features are similar to priors. Electronically Signed   By: Kreg Shropshire M.D.   On: 11/17/2020 15:30    Scheduled Meds: . apixaban  5 mg Oral BID  . Chlorhexidine Gluconate Cloth  6 each Topical Daily  . levothyroxine  175 mcg Oral Daily  . metoprolol tartrate  25 mg Oral BID  . pravastatin  40 mg Oral q1800   Continuous Infusions: . cefTRIAXone (ROCEPHIN)  IV Stopped (11/18/20 0036)  . dextrose 50 mL/hr at 11/18/20 1040  . levETIRAcetam 250 mg (11/18/20 0926)   Brief history:.  Patient was admitted 11/17/2020 with seizure.  History of stroke with left-sided paralysis.  Empirically placed on Keppra.  EEG pending.  MRI brain did not show any acute stroke.  Patient had repeated hypoglycemia here. Assessment/Plan:  1. Repeated hypoglycemia with type 2 diabetes on insulin and Glucophage as outpatient.  Placed on D5 drip.  Continue to watch sugars.  Hemoglobin A1c 6.6.  Recommend discontinuing insulin.  We will have to trend sugars before deciding on Metformin. 2. New onset seizure with history of stroke.  MRI did not show any acute stroke.  EEG pending.  Placed on Keppra.  Case discussed with neurology.  Daughter concerned about the violent movements of her right arm.  I tried a dose of Zanaflex.  Anticoagulated with Eliquis. 3. Acute cystitis with hematuria.  Placed empirically on Rocephin.  Follow-up  urine culture. 4. Anemia.  Ferritin is high which goes along with anemia of chronic disease we will check B12 and TIBC tomorrow morning.  Continue watch while on Eliquis. 5. Hypothyroidism unspecified.  Check TSH tomorrow morning.  Continue levothyroxine 6. Essential hypertension on metoprolol 7. Hyperlipidemia unspecified on pravastatin 8. Paroxysmal atrial fibrillation on Eliquis to reduce stroke risk 9. Previous large stroke completed with left-sided paralysis      Code Status:     Code Status Orders  (From admission, onward)         Start     Ordered   11/17/20 2005  Do not attempt resuscitation (DNR)  Continuous       Question Answer Comment  In the event of cardiac or respiratory ARREST Do not call a "code blue"   In the event of cardiac or respiratory ARREST Do not perform Intubation, CPR, defibrillation or ACLS   In the event of cardiac  or respiratory ARREST Use medication by any route, position, wound care, and other measures to relive pain and suffering. May use oxygen, suction and manual treatment of airway obstruction as needed for comfort.      11/17/20 2006        Code Status History    Date Active Date Inactive Code Status Order ID Comments User Context   11/17/2020 1459 11/17/2020 2006 DNR [id number]  Sharman Cheek, MD ED   04/11/2020 1817 05/05/2020 2107 Full Code [id number]  Charlton Amor, PA-C Inpatient   03/24/2020 0251 03/24/2020 0959 Full Code [id number]  Julieanne Cotton, MD Inpatient   03/24/2020 0000 03/24/2020 0251 Full Code [id number]  Aroor, Dara Lords, MD ED   08/20/2018 0002 08/22/2018 2225 Full Code [id number]  Therisa Doyne, MD Inpatient   02/10/2018 0350 02/14/2018 2130 Full Code [id number]  Eduard Clos, MD ED   04/09/2014 1649 04/11/2014 1845 Full Code [id number]  Kathryne Hitch, MD Inpatient   06/09/2012 1856 06/12/2012 1317 Full Code [id number]  Ginger Organ, RN Inpatient   Advance Care Planning Activity    Advance Directive  Documentation   Flowsheet Row Most Recent Value  Type of Advance Directive Out of facility DNR (pink MOST or yellow form)  Pre-existing out of facility DNR order (yellow form or pink MOST form) Pink MOST form placed in chart (order not valid for inpatient use)  "MOST" Form in Place? --     Family Communication: Spoke with daughter at the phone Disposition Plan: Status is: Inpatient  Dispo: The patient is from: [facility]              Anticipated d/c is to: [facility]              Anticipated d/c date is: Likely will need another day or 2 here in the hospital              Patient currently being treated for hypoglycemia, seizure and also has been uncontrollable movements of right arm.   Difficult to place patient.  No  Consultants:  Neurology Antibiotics:  Rocephin  Time spent: 28 minutes  Marna Weniger [employer]

## 2020-11-18 NOTE — Progress Notes (Signed)
Hypoglycemic Event  CBG:0911  CBG was 56 Treatment: 4 oz of apple juice was given without complication.  Symptoms:Patient was asymptomatic Follow-up CBG: 0942    CBG Result: 70 Possible Reasons for Event: Patient had not eaten breakfast   Comments/MD notified: Dr. Renae Gloss was notified and D5 was ordered at 76ml/hr, with CBG every 2 hours.     Laurina Bustle

## 2020-11-18 NOTE — Consult Note (Signed)
Neurology Consultation Reason for Consult: Seizures Requesting Physician: Alford Highland  CC: seizures  History is obtained from: Chart review and daughter at bedside  HPI: Meredith Shaw is a 75 y.o. female with a past medical history significant for right MCA stroke (03/2020), paroxysmal atrial fibrillation (s/p loop recorder 04/2020 on Eliquis), diabetes, hypertension, hypothyroidism s/p thyroid ablation, kidney stones, arthritis, osteoporosis, bilateral hip replacements, hematuria stage III chronic kidney disease.  She was at her skilled nursing facility when she had generalized shaking at her memory care unit, for which she was brought to the ED for further evaluation.  There she was noted to have purulent urine and was admitted for concern for sepsis and IV antibiotics as well as seizure work-up.  She was last seen by neurology on 06/05/2020 (Dr. Ihor Austin at [facility]).  At the time her neurological history was noted to be significant for a right MCA infarct on 03/23/2020 s/p TPA and IR with T3 revascularization, unfortunately reoccluded on follow-up imaging.  She was started on pravastatin and Zetia for her dyslipidemia (LDL 63, triglycerides 500), and noted to have uncontrolled diabetes with an A1c of 8.9%.  She had initially been started on DAPT which needed to be discontinued due to hematuria and her hospitalization was complicated by respiratory failure, UTI with leukocytosis and acute kidney injury.  She was discharged to acute rehab.  Her residual deficits included left spastic hemiplegia, left-sided neglect, dysarthria, facial droop, cognitive impairment.  Her dysphagia had resolved and she was tolerating a regular diet, and tolerating Zanaflex 1 mg nightly with improvement in tremors/spasm.  She is nonambulatory at new baseline since her stroke.  Given her loop recorder has since shown atrial fibrillation, she has been started on Eliquis for cardioembolic stroke.   Daughter notes that she was able to eventually progress to sufficient bladder function to allow removal of the Foley cath.  Per palliative care notes "Her weight has decreased 15% in six months which is 30 pounds" she was transitioned to comfort care on 1/26.  Her daughter notes that she began to gradually decline in early December and has continued to decline steadily, with reduced oral intake, paucity of speech and reduced interactiveness.  She does have some good days and bad days, but overall has been on a downward trajectory.  OYD:XAJOIN to obtain due to altered mental status.   Past Medical History:  Diagnosis Date  . Arthritis    OA AND PAIN BOTH KNEES -RIGHT KNEE HURTS WORSE THAN LEFT  . Cataract   . Diabetes mellitus    ORAL MEDICATION - NO INSULIN  . Hypertension   . Hypertensive retinopathy    OU  . Hypothyroidism   . Kidney stones    NONE AT PRESENT TIME THAT PT IS AWARE OF  . Osteoporosis   . Paroxysmal atrial fibrillation (HCC) 08/25/2020     Family History  Problem Relation Age of Onset  . Heart disease Mother   . Breast cancer Mother        17's  . Stroke Father   . Heart disease Father   . Healthy Daughter   . Healthy Son   . Liver disease Maternal Grandmother        gall stones imbedded in liver - blead to death when in surgery   . Pneumonia Maternal Grandfather   . Diabetes Paternal Grandmother      Social History:  reports that she has never smoked. She has never used smokeless tobacco. She  reports that she does not drink alcohol and does not use drugs.   Exam: Current vital signs: BP 115/89 (BP Location: Right Arm)   Pulse (!) 108   Temp 97.6 F (36.4 C) (Oral)   Resp 20   Ht 5\' 4"  (1.626 m)   Wt 63.5 kg   SpO2 99%   BMI 24.03 kg/m  Vital signs in last 24 hours: Temp:  [97.6 F (36.4 C)-99.2 F (37.3 C)] 97.6 F (36.4 C) (02/08 0428) Pulse Rate:  [78-108] 108 (02/08 0428) Resp:  [15-21] 20 (02/08 0428) BP: (78-115)/(50-89) 115/89  (02/08 0428) SpO2:  [88 %-99 %] 99 % (02/07 2220) Weight:  [63.5 kg] 63.5 kg (02/08 0346)   Physical Exam  Constitutional: Appears well-developed and well-nourished.  Psych: Affect appropriate to situation Eyes: No scleral injection HENT: No OP obstrucion MSK: no joint deformities.  Cardiovascular: Normal rate and regular rhythm.  Respiratory: Effort normal, non-labored breathing GI: Soft.  No distension. There is no tenderness.  Skin: WDI  Neuro: Mental Status: At times opens her eyes.  No intelligible verbal output.  Makes some brief sound sometimes.  Orients to examiner and tracks at times. Cranial Nerves: II: Pupils are equal, round, and reactive to light 3 to 2 mm III,IV, VI: At times she has a right gaze preference, but at other times she seems to be rhythmically looking over towards the left with therapeutic saccadic movements.  At times she has roving eye movements. V: Facial sensation is symmetric to eyelash brush VII: Facial movement is symmetric.  VIII: hearing is intact to voice, does orient to voice intermittently X: Uvula elevates symmetrically XI: Shoulder shrug is symmetric. XII: tongue is midline without atrophy or fasciculations.  Motor: Tone is notable for contracture of the left upper extremity and increased tone in the left lower extremity as well.  She spontaneously nearly continuously but irregularly bangs the bed with her right upper extremity.  She has some spontaneous movement of the bilateral lower extremities. Sensory: Similarly reactive to touch in all 4 extremities.  Yells out in pain with any movement of her left upper extremity. Deep Tendon Reflexes: Relatively increased on the left compared to the right Plantars: Upgoing toe on the left, mute on the right Cerebellar: Unable to assess secondary to patient's mental status   I have reviewed labs in epic and the results pertinent to this consultation are: New anemia to 8.2 (baseline 11.8),  leukocytosis to 19.3, thrombocytosis to 492 (normal baseline), ferritin elevated at 716, renal function at her baseline (creatinine 1.07, GFR 55).  Hemoglobin A1c has dropped significantly from 8.9 in June 2021 to 6.6  No results found for: VITAMINB12 Lab Results  Component Value Date   TSH 0.693 02/21/2020   I have reviewed the images obtained: Head CT without acute intracranial process MRI brain without acute intracranial process, evolution of her prior stroke  EEG:  ABNORMALITY -Continuous slow, generalized and lateralized right hemisphere - Sharp wave, right frontal region IMPRESSION: This studyshowed evidence of potential epileptogenicity arising from right frontal region. There is also cortical dysfunction in right hemisphere likely due to underlying stroke, post-ictal state. Additionally there is moderate diffuse encephalopathy, nonspecific etiology. No seizures were seen throughout the recording.  Impression: This is a 75 year old woman with past medical history significant for right MCA stroke, with subsequent course complicated by urinary issues (hematuria, UTI), presenting with concern for recurrent UTI and seizure.  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  Initially in  ED given concern for possible renal dysfunction in the setting of the purulent urine, started a low dose of Keppra (250 mg twice daily).  Given ongoing gaze deviation concerning for seizure activity on my evaluation, will increase Keppra today.  Unclear etiology of her decline at this time.  Given goals of care will do a limited work-up of simple reversible/treatable causes.  Recommendations:  #Seizures -Increase Keppra to 500 mg twice daily (ordered)  # Rapid cognitive decline - Agree with B12, TSH - Also ordered labs: thiamine, RPR, HIV  Brooke Dare MD-PhD [facility] [phone]

## 2020-11-18 NOTE — Progress Notes (Signed)
Inpatient Diabetes Program Recommendations  AACE/ADA: New Consensus Statement on Inpatient Glycemic Control   Target Ranges:  Prepandial:   less than 140 mg/dL      Peak postprandial:   less than 180 mg/dL (1-2 hours)      Critically ill patients:  140 - 180 mg/dL  Results for DANYALE, RIDINGER (MRN [id number]) as of 11/18/2020 12:20  Ref. Range 11/17/2020 22:16 11/18/2020 01:07 11/18/2020 09:11 11/18/2020 09:42 11/18/2020 11:47  Glucose-Capillary Latest Ref Range: 70 - 99 mg/dL 38 (LL) 053 (H) 56 (L) 70 186 (H)  Results for CARLISS, PORCARO (MRN [id number]) as of 11/18/2020 12:20  Ref. Range 11/17/2020 15:10  Glucose Latest Ref Range: 70 - 99 mg/dL 89   Results for VICTORIOUS, COSIO (MRN [id number]) as of 11/18/2020 12:20  Ref. Range 03/24/2020 04:56 11/17/2020 15:10  Hemoglobin A1C Latest Ref Range: 4.8 - 5.6 % 8.9 (H) 6.6 (H)   Review of Glycemic Control  Diabetes history: DM2 Outpatient Diabetes medications: Lantus 25 units daily, Metformin 500 mg QAM Current orders for Inpatient glycemic control: None; CBG monitoring Q2H  Inpatient Diabetes Program Recommendations:    Insulin: Once glucose is stable and no further issues with hypoglycemia, may want to order Novolog 0-6 units Q4H if appropriate.  Outpatient DM medications: Per chart patient's health has declined over the past several weeks. Noted glucose down to 38 mg/dl on 02/11/28 at 92:42. Given patient's health decline, seizure activity, noted hypoglycemia, and A1C of 6.6% on 11/17/20, may want to discontinue or significantly decrease Lantus dose at discharge.   Thanks, Orlando Penner, RN, MSN, CDE Diabetes Coordinator Inpatient Diabetes Program [phone] (Team Pager from 8am to 5pm)

## 2020-11-18 NOTE — Progress Notes (Signed)
eeg done °

## 2020-11-18 NOTE — Progress Notes (Signed)
[facility] Room 240 [facility]) Hospital Liaison Hospitalized Hospice patient RN note:   Meredith Shaw is a current hospice patient with a terminal diagnosis of cognitive decline related to cerebral infarction. On 02.07 at [facility] she was noticed to have a seizure-like activity by the nursing staff followed by prolonged somnolence, outside of her baseline.  She had 2 additional episodes witnessed by her family and was thus sent to the emergency room for evaluation. Neurology recommended Keppra 250 twice daily. Family did not contact hospice prior to patient being transported to hospital. Patient was admitted to [facility] on 02.07 at 8:30pm with a diagnosis of AMS. Patient is a DNR. Per Dr. Dan Humphreys with [facility], this is a related admission.  Visited with patient and daughter, Meredith Shaw at bedside. Patient was alert but non verbal. She appeared to be comfortable but continued to hit the bed with her right hand. Daughter reports that she has eaten. Exchanged report with bedside RN. Patient is receiving IV Keppra and IV Rocephin for UTI.  VS: BP 97/55, HR 86, Resp 18, Temp 97.8, O2 sat 97% on RA  I&O: 377ml/250ml  Abnormal Labs: BUN: 38 (H) Creatinine: 1.07 (H) Albumin: 1.7 (L) AST: 14 (L) Total Protein: 5.9 (L) GFR, Estimated: 55 (L) Ferritin: 716 (H) WBC: 19.3 (H) RBC: 3.12 (L) Hemoglobin: 8.2 (L) HCT: 27.5 (L) MCHC: 29.8 (L) Platelets: 492 (H) NEUT#: 17.3 (H) Abs Immature Granulocytes: 0.15 (H) Hemoglobin A1C: 6.6 (H)  Diagnostics: CXR IMPRESSION: Mild pulmonary vascular congestion without other convincing features of edema.  More chronically coarsened interstitial and bronchitic features are similar to priors.  CT HEAD IMPRESSION: 1. No acute intracranial pathology. 2. 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  MRI BRAIN IMPRESSION: 1. No acute intracranial abnormality.  2. Expected evolution of the large right MCA infarct from June  of last year. Associated encephalomalacia, laminar necrosis, hemosiderin. Mild progression of small vessel infarcts elsewhere since that time.  IV/PRN Meds: cefTRIAXone (ROCEPHIN) 1 g in sodium chloride 0.9 % 100 mL IVPB Dose: 1 g Freq: Every 24 hours Route: IV Last Dose: Stopped (11/18/20 0036) Start: 11/17/20 2015  dextrose 5 % solution Rate: 50 mL/hr Freq: Continuous Route: IV Start: 11/18/20 1030  levETIRAcetam (KEPPRA) IVPB 500 mg/100 mL premix Dose: 500 mg Freq: 2 times daily Route: IV Start: 11/18/20 2000  sodium chloride 0.9 % bolus 1,000 mL Dose: 1,000 mL Freq: Once Route: IV Last Dose: Stopped (11/17/20 1704) Start: 11/17/20 1500 End: 11/17/20 1704  Problem List: Assessment/Plan:  1. Repeated hypoglycemia with type 2 diabetes on insulin and Glucophage as outpatient.  Placed on D5 drip.  Continue to watch sugars.  Hemoglobin A1c 6.6.  Recommend discontinuing insulin.  We will have to trend sugars before deciding on Metformin. 2. New onset seizure with history of stroke.  MRI did not show any acute stroke.  EEG pending.  Placed on Keppra.  Case discussed with neurology.  Daughter concerned about the violent movements of her right arm.  I tried a dose of Zanaflex.  Anticoagulated with Eliquis. 3. Acute cystitis with hematuria.  Placed empirically on Rocephin.  Follow-up urine culture. 4. Anemia.  Ferritin is high which goes along with anemia of chronic disease we will check B12 and TIBC tomorrow morning.  Continue watch while on Eliquis. 5. Hypothyroidism unspecified.  Check TSH tomorrow morning.  Continue levothyroxine 6. Essential hypertension on metoprolol 7. Hyperlipidemia unspecified on pravastatin 8. Paroxysmal atrial fibrillation on Eliquis to reduce stroke risk 9. Previous large stroke  completed with left-sided paralysis  Discharge Planning: Back to [facility] when medically stable.  Family Contact: Spoke with daughter, Meredith Shaw in room.  IDG: Updated  Goals of  Care: Clear  Med list placed on Shadow Chart.  Please call with any hospice related questions or concerns.  Cyndra Numbers, RN [facility] Liaison [phone]

## 2020-11-18 NOTE — Procedures (Signed)
Patient Name: GENESEE NASE  MRN: [id number]  Epilepsy Attending: Charlsie Quest  Referring Physician/Provider: Dr Lindajo Royal Date: 11/18/2020 Duration: 24.12 mins  Patient history: 75yo F with seizure like activity. EEG to evaluate for seizure.  Level of alertness: Awake  AEDs during EEG study: None  Technical aspects: This EEG study was done with scalp electrodes positioned according to the 10-20 International system of electrode placement. Electrical activity was acquired at a sampling rate of 500Hz  and reviewed with a high frequency filter of 70Hz  and a low frequency filter of 1Hz . EEG data were recorded continuously and digitally stored.   Description: No clear posterior dominant rhythm was seen. EEG showed continuous/ generalized and lateralized right hemispheric 3 to 6 Hz theta-delta slowing. Sharp wave were seen in right frontal region.   Physiologic photic driving was not seen during photic stimulation.  Hyperventilation was not performed.     ABNORMALITY -Continuous slow, generalized and lateralized right hemisphere - Sharp wave, right frontal region  IMPRESSION: This studyshowed evidence of potential epileptogenicity arising from right frontal region. There is also cortical dysfunction in right hemisphere likely due to underlying stroke, post-ictal state. Additionally there is moderate diffuse encephalopathy, nonspecific etiology. No seizures were seen throughout the recording.  Leatha Rohner 

## 2020-11-18 NOTE — Progress Notes (Signed)
SLP Cancellation Note  Patient Details [name] MRN: [id number] DOB: 1946/09/02   Cancelled treatment:       Reason Eval/Treat Not Completed: Patient at procedure or test/unavailable (chart reviewed; attempted 2x). Received order, reviewed chart and consulted NSG. NSG reported pt was eating/drinking w/out difficulty - no overt s/s of aspiration noted w/ po's, Pills. Pt has Baseline deficits s/p previous CVA impacting Cognitive-communication. Pt if followed by [facility] per chart notes.  ST services will f/u w/ pt tomorrow for BSE. Recommend general aspiration precautions w/ all po intake. NSG agreed.     Jerilynn Som, MS, CCC-SLP Speech Language Pathologist Rehab Services [phone] [facility] 11/18/2020, 2:57 PM

## 2020-11-18 NOTE — Plan of Care (Signed)
  Problem: Education: Goal: Knowledge of General Education information will improve Description Including pain rating scale, medication(s)/side effects and non-pharmacologic comfort measures Outcome: Progressing   

## 2020-11-19 DIAGNOSIS — N3000 Acute cystitis without hematuria: Secondary | ICD-10-CM

## 2020-11-19 DIAGNOSIS — I69391 Dysphagia following cerebral infarction: Secondary | ICD-10-CM

## 2020-11-19 LAB — BASIC METABOLIC PANEL
Anion gap: 12 (ref 5–15)
BUN: 20 mg/dL (ref 8–23)
CO2: 21 mmol/L — ABNORMAL LOW (ref 22–32)
Calcium: 8.5 mg/dL — ABNORMAL LOW (ref 8.9–10.3)
Chloride: 106 mmol/L (ref 98–111)
Creatinine, Ser: 0.74 mg/dL (ref 0.44–1.00)
GFR, Estimated: >60 mL/min (ref >60)
Glucose, Bld: 185 mg/dL — ABNORMAL HIGH (ref 70–99)
Potassium: 3.6 mmol/L (ref 3.5–5.1)
Sodium: 139 mmol/L (ref 135–145)

## 2020-11-19 LAB — TSH: TSH: 0.446 u[IU]/mL (ref 0.350–4.500)

## 2020-11-19 LAB — GLUCOSE, CAPILLARY
Glucose-Capillary: 115 mg/dL — ABNORMAL HIGH (ref 70–99)
Glucose-Capillary: 131 mg/dL — ABNORMAL HIGH (ref 70–99)
Glucose-Capillary: 147 mg/dL — ABNORMAL HIGH (ref 70–99)
Glucose-Capillary: 156 mg/dL — ABNORMAL HIGH (ref 70–99)
Glucose-Capillary: 164 mg/dL — ABNORMAL HIGH (ref 70–99)
Glucose-Capillary: 177 mg/dL — ABNORMAL HIGH (ref 70–99)
Glucose-Capillary: 198 mg/dL — ABNORMAL HIGH (ref 70–99)

## 2020-11-19 LAB — CBC
HCT: 25.7 % — ABNORMAL LOW (ref 36.0–46.0)
Hemoglobin: 7.9 g/dL — ABNORMAL LOW (ref 12.0–15.0)
MCH: 26.9 pg (ref 26.0–34.0)
MCHC: 30.7 g/dL (ref 30.0–36.0)
MCV: 87.4 fL (ref 80.0–100.0)
Platelets: 414 10*3/uL — ABNORMAL HIGH (ref 150–400)
RBC: 2.94 MIL/uL — ABNORMAL LOW (ref 3.87–5.11)
RDW: 14.6 % (ref 11.5–15.5)
WBC: 17.8 10*3/uL — ABNORMAL HIGH (ref 4.0–10.5)
nRBC: 0 % (ref 0.0–0.2)

## 2020-11-19 LAB — URINE CULTURE

## 2020-11-19 LAB — IRON AND TIBC: Iron: 9 ug/dL — ABNORMAL LOW (ref 28–170)

## 2020-11-19 LAB — VITAMIN B12: Vitamin B-12: 333 pg/mL (ref 180–914)

## 2020-11-19 LAB — HIV ANTIBODY (ROUTINE TESTING W REFLEX): HIV Screen 4th Generation wRfx: NONREACTIVE

## 2020-11-19 MED ORDER — ACETAMINOPHEN 650 MG RE SUPP
650.0000 mg | RECTAL | Status: DC | PRN
  Administered 2020-11-19 – 2020-11-21 (×2): 650 mg via RECTAL
  Filled 2020-11-19 (×3): qty 1

## 2020-11-19 MED ORDER — THIAMINE HCL 100 MG/ML IJ SOLN: 100.0000 mg | INTRAMUSCULAR | Status: DC

## 2020-11-19 MED ORDER — THIAMINE HCL 100 MG/ML IJ SOLN
500.0000 mg | Freq: Three times a day (TID) | INTRAVENOUS | Status: DC
  Administered 2020-11-20 – 2020-11-21 (×5): 500 mg via INTRAVENOUS
  Filled 2020-11-19 (×7): qty 5

## 2020-11-19 MED ORDER — VITAMIN B-12 1000 MCG PO TABS
1000.0000 ug | ORAL_TABLET | Freq: Every day | ORAL | Status: DC
  Administered 2020-11-21: 1000 ug via ORAL
  Filled 2020-11-19 (×2): qty 1

## 2020-11-19 NOTE — Progress Notes (Signed)
Called to room from NT reporting patient increased lethargy and warm to the touch. Ice packs placed on patient and axillary tempo at 99.8. MD Denton Lank notified. Neuro check complete and WDL by this RN. Secure chat from [facility] to get rectal temp on patient Rectal 101.8. MD Denton Lank notified.

## 2020-11-19 NOTE — Progress Notes (Signed)
Cross Cover Blood sugars improving - now in 200 range. Decrease supportive D5 to 25 and continue monitor CBG every 4 hours.  Discontinue D5 when stability of bloood glucose is eviden

## 2020-11-19 NOTE — Progress Notes (Addendum)
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 fib on Eliquis who presented to the ED on 11/17/2020 after having witnessed seizure-like activity at SNF followed by prolonged somnolence.  She had 2 additional episodes that were witnessed by family.    Of note, patient is currently on hospice for terminal diagnosis of cognitive decline secondary to her stroke.  Family do not want aggressive measures, prefer supportive care only.  ED patient was hypotensive with BP 78/50, low-grade temp of 90 9.55F, labs notable for leukocytosis 19 K hemoglobin 8.2, urinalysis questionable for UTI.  Admitted to hospitalist service with neurology consulted for further evaluation.  EEG was consistent with seizure activity.  Patient was started on Keppra for seizure prevention.  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  Is currently still receiving IV antibiotics for UTI and is on dextrose infusion for hypoglycemia.    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  Rectal Tylenol ordered as patient somnolent. Currently on  Rocephin empirically for UTI.  UTI -POA.  Initial urine culture grew multiple species concerning for contamination however patient febrile today as above.  Continue Rocephin for now.  Repeat urine culture.  Hypoglycemia -continue D5W infusion and glucose monitoring.   Can likely try off the drip tomorrow and see how she does.  Suspect this is due to poor p.o. intake.  Insulin-dependent type 2 diabetes -takes Metformin and basal insulin at home.  Last A1c 6.6% Likely patient no longer needs insulin, consider discontinuing at discharge.   Watch CBGs further before deciding whether safe to continue Metformin.  New onset seizure /post stroke epilepsy -MRI was negative for any new acute stroke.  EEG was consistent with epileptogenic city for for arising from the right frontal region along with cortical dysfunction of the right hemisphere likely due to underlying stroke, postictal state, moderate diffuse encephalopathy which is nonspecific. --Neurology consulting, appreciate recommendations --Continue Keppra 500 mg twice daily  Cognitive decline -course has been fairly rapid since patient stroke.   --Follow-up on pending B12, TSH, thiamine, RPR, HIV  History of right MCA stroke with left-sided hemiplegia-in July 2021.  MRI brain this admission did not show any new or more recent stroke from prior.  Neurology is following --Continue Eliquis  Hypothyroidism -continue levothyroxine  Essential hypertension -on metoprolol  Hyperlipidemia -on pravastatin  Paroxysmal A. Fib -rate currently controlled.  Continue Eliquis.  Hospice patient -patient is followed by hospice outpatient due to her rapid of cognitive and functional decline since her stroke.  They will resume following patient after this admission I have discussed her case with hospice liaison here.  Patient BMI: Body mass index is 27.98 kg/m.   DVT prophylaxis:  apixaban (ELIQUIS) tablet 5 mg   Diet:  Diet Orders (From admission,  onward)    Start     Ordered   11/19/20 1435  DIET DYS 2 Room service appropriate? Yes with Assist; Fluid consistency: Thin  Diet effective now       Comments: Extra Gravy on meats, potatoes. Yogurt, Cream Soups, Puddings, hot cereals w/ sugar at meals.  Question Answer Comment  Room service appropriate? Yes with Assist   Fluid consistency: Thin      11/19/20 1437            Code Status: DNR    Subjective 11/19/20    Patient seen at bedside this morning and is for the most part nonresponsive and nonverbal.  She is sleeping soundly does grimace and begin to respond to sternal rub but does not open her eyes on command.  Apparently was given some medications for "agitation" overnight.   Disposition Plan & Communication   Status is: Inpatient  Remains inpatient appropriate because:Inpatient level of care appropriate due to severity of illness   Dispo: The patient is from: SNF              Anticipated d/c is to: SNF              Anticipated d/c date is: 2 days              Patient currently is not medically stable to d/c.   Difficult to place patient No   Family Communication: None at bedside, will attempt to call this afternoon   Consults, Procedures, Significant Events   Consultants:   Neurology  Procedures:   EEG  Antimicrobials:  Anti-infectives (From admission, onward)   Start     Dose/Rate Route Frequency Ordered Stop   11/17/20 2245  cefTRIAXone (ROCEPHIN) 1 g in sodium chloride 0.9 % 100 mL IVPB  Status:  Discontinued        1 g 200 mL/hr over 30 Minutes Intravenous Every 24 hours 11/17/20 2159 11/17/20 2242   11/17/20 2015  cefTRIAXone (ROCEPHIN) 1 g in sodium chloride 0.9 % 100 mL IVPB        1 g 200 mL/hr over 30 Minutes Intravenous Every 24 hours 11/17/20 2006     11/17/20 1815  cefTRIAXone (ROCEPHIN) 2 g in sodium chloride 0.9 % 100 mL IVPB        2 g 200 mL/hr over 30 Minutes Intravenous  Once 11/17/20 1802 11/17/20 2050        Objective    Vitals:   11/19/20 0926 11/19/20 0958 11/19/20 1158 11/19/20 1438  BP:   (!) 104/55 (!) 81/69  Pulse:   84 83  Resp:   20 19  Temp: 99.8 F (37.7 C) (!) 101.8 F (38.8 C) 97.8 F (36.6 C) (!) 97.5 F (36.4 C)  TempSrc: Axillary Rectal Oral Oral  SpO2:   96% 94%  Weight:      Height:        Intake/Output Summary (Last 24 hours) at 11/19/2020 1710 Last data filed at 11/19/2020 1404 Gross per 24 hour  Intake 1960.11 ml  Output 1275 ml  Net 685.11 ml   Filed Weights   11/18/20 0346 11/19/20 0350  Weight: 63.5 kg 73.9 kg    Physical Exam:  General exam: Sleeping comfortably, no acute distress, grimaces to sternal rub but does not open eyes  Respiratory system: CTAB, no  wheezes, rales or rhonchi, normal respiratory effort. Cardiovascular system: normal S1/S2, RRR, no pedal edema.   Gastrointestinal system: soft, NT, ND, +bowel sounds. Central nervous system: Somnolent, minimally responsive, unable to fully evaluate due to patient's current condition Skin: Pale, warm distal extremities dry, intact   Labs   Data Reviewed: I have personally reviewed following labs and imaging studies  CBC: Recent Labs  Lab 11/17/20 1510 11/19/20 0523  WBC 19.3* 17.8*  NEUTROABS 17.3*  --   HGB 8.2* 7.9*  HCT 27.5* 25.7*  MCV 88.1 87.4  PLT 492* 414*   Basic Metabolic Panel: Recent Labs  Lab 11/17/20 1510 11/19/20 0523  NA 142 139  K 3.6 3.6  CL 107 106  CO2 25 21*  GLUCOSE 89 185*  BUN 38* 20  CREATININE 1.07* 0.74  CALCIUM 9.0 8.5*   GFR: Estimated Creatinine Clearance: 60.8 mL/min (by C-G formula based on SCr of 0.74 mg/dL). Liver Function Tests: Recent Labs  Lab 11/17/20 1510  AST 14*  ALT 7  ALKPHOS 89  BILITOT 0.4  PROT 5.9*  ALBUMIN 1.7*   Recent Labs  Lab 11/17/20 1510  LIPASE 22   No results for input(s): AMMONIA in the last 168 hours. Coagulation Profile: No results for input(s): INR, PROTIME in the last 168 hours. Cardiac Enzymes: No results  for input(s): CKTOTAL, CKMB, CKMBINDEX, TROPONINI in the last 168 hours. BNP (last 3 results) No results for input(s): PROBNP in the last 8760 hours. HbA1C: Recent Labs    11/17/20 1510  HGBA1C 6.6*   CBG: Recent Labs  Lab 11/19/20 0438 11/19/20 0809 11/19/20 0939 11/19/20 1218 11/19/20 1605  GLUCAP 164* 177* 156* 147* 131*   Lipid Profile: No results for input(s): CHOL, HDL, LDLCALC, TRIG, CHOLHDL, LDLDIRECT in the last 72 hours. Thyroid Function Tests: Recent Labs    11/19/20 0523  TSH 0.446   Anemia Panel: Recent Labs    11/17/20 1510 11/19/20 0523  VITAMINB12  --  333  FERRITIN 716*  --   TIBC  --  NOT CALCULATED  IRON  --  9*   Sepsis Labs: No results for input(s): PROCALCITON, LATICACIDVEN in the last 168 hours.  Recent Results (from the past 240 hour(s))  Urine culture     Status: Abnormal   Collection Time: 11/17/20  2:53 PM   Specimen: Urine, Random  Result Value Ref Range Status   Specimen Description   Final    URINE, RANDOM Performed at [facility], [address] [place], [place] [address]    Special Requests   Final    NONE Performed at [facility], [address] [place], [place] [address]    Culture MULTIPLE SPECIES PRESENT, SUGGEST RECOLLECTION (A)  Final   Report Status 11/19/2020 FINAL  Final  Blood culture (single)     Status: None (Preliminary result)   Collection Time: 11/17/20  6:39 PM   Specimen: BLOOD  Result Value Ref Range Status   Specimen Description BLOOD RIGHT ANTECUBITAL  Final   Special Requests   Final    BOTTLES DRAWN AEROBIC AND ANAEROBIC Blood Culture adequate volume   Culture   Final    NO GROWTH 2 DAYS Performed at [facility], [address], [place], [place] [address]    Report Status PENDING  Incomplete  SARS CORONAVIRUS 2 (TAT 6-24 HRS) Nasopharyngeal Nasopharyngeal Swab     Status: None   Collection Time: 11/17/20  6:50 PM   Specimen: Nasopharyngeal Swab  Result Value  Ref Range Status   SARS Coronavirus 2 NEGATIVE NEGATIVE Final    Comment: (NOTE) SARS-CoV-2 target nucleic acids are NOT DETECTED.  The SARS-CoV-2 RNA is generally detectable in upper and lower respiratory specimens during the acute phase of infection. Negative results do not preclude SARS-CoV-2 infection, do not rule out co-infections with other pathogens, and should not be used as the sole basis for treatment or other patient management decisions. Negative results must be combined with clinical observations, patient history, and epidemiological information. The expected result is Negative.  Fact Sheet for Patients: HairSlick.no  Fact Sheet for Healthcare Providers: quierodirigir.com  This test is not yet approved or cleared by the [place] FDA and  has been authorized for detection and/or diagnosis of SARS-CoV-2 by FDA under an Emergency Use Authorization (EUA). This EUA will remain  in effect (meaning this test can be used) for the duration of the COVID-19 declaration under Se ction 564(b)(1) of the Act, 21 U.S.C. section 360bbb-3(b)(1), unless the authorization is terminated or revoked sooner.  Performed at [facility], [address] [place], [place] [address]       Imaging Studies   EEG  Result Date: 11/18/2020 Charlsie Quest, MD     11/18/2020  3:47 PM Patient [name] MRN: [id number] Epilepsy Attending: Charlsie Quest Referring Physician/Provider: Dr Lindajo Royal Date: 11/18/2020 Duration: 24.12 mins Patient history: 74yo F with seizure like activity. EEG to evaluate for seizure. Level of alertness: Awake AEDs during EEG study: None Technical aspects: This EEG study was done with scalp electrodes positioned according to the 10-20 International system of electrode placement. Electrical activity was acquired at a sampling rate of 500Hz  and reviewed with a high frequency filter of 70Hz  and a low  frequency filter of 1Hz . EEG data were recorded continuously and digitally stored. Description: No clear posterior dominant rhythm was seen. EEG showed continuous/ generalized and lateralized right hemispheric 3 to 6 Hz theta-delta slowing. Sharp wave were seen in right frontal region.   Physiologic photic driving was not seen during photic stimulation.  Hyperventilation was not performed.   ABNORMALITY -Continuous slow, generalized and lateralized right hemisphere - Sharp wave, right frontal region IMPRESSION: This studyshowed evidence of potential epileptogenicity arising from right frontal region. There is also cortical dysfunction in right hemisphere likely due to underlying stroke, post-ictal state. Additionally there is moderate diffuse encephalopathy, nonspecific etiology. No seizures were seen throughout the recording.   MR BRAIN WO CONTRAST  Result Date: 11/18/2020 CLINICAL DATA:  75 year old female with altered mental status. Seizure like activity. Previous right MCA infarct. Atrial fibrillation on Eliquis. EXAM: MRI HEAD WITHOUT CONTRAST TECHNIQUE: Multiplanar, multiecho pulse sequences of the brain and surrounding structures were obtained without intravenous contrast. COMPARISON:  Head CT 11/17/2020.  Brain MRI 03/25/2020. FINDINGS: Brain: Expected evolution of the relatively large right MCA territory infarct from June of last year. Encephalomalacia with areas of laminar necrosis and hemosiderin now. Ex vacuo enlargement of the right lateral ventricle. Associated Wallerian degeneration. No restricted diffusion to suggest acute infarction. No midline shift, mass effect, evidence of mass lesion, ventriculomegaly, extra-axial collection or acute intracranial hemorrhage. Cervicomedullary junction and pituitary are within normal limits. Outside of the right MCA territory patchy and confluent white matter T2 and FLAIR hyperintensity has mildly progressed from last year, including a small  area of possible cortical encephalomalacia in the left anterior frontal lobe on series 12, image 18. Chronic lacunar infarcts of the left thalamus are  stable. Small chronic infarcts in the right cerebellum have not definitely progressed. No chronic hemorrhage outside of the right MCA territory. Thin coronal imaging does not suggest mesial temporal sclerosis. Vascular: Major intracranial vascular flow voids appear stable since last year. Skull and upper cervical spine: Stable visible cervical spine degeneration. Normal bone marrow signal. Sinuses/Orbits: Stable, negative. Other: Visible internal auditory structures appear normal. Mastoids remain clear. IMPRESSION: 1. No acute intracranial abnormality. 2. Expected evolution of the large right MCA infarct from June of last year. Associated encephalomalacia, laminar necrosis, hemosiderin. Mild progression of small vessel infarcts elsewhere since that time. Electronically Signed   By: Odessa Fleming M.D.   On: 11/18/2020 08:59     Medications   Scheduled Meds: . apixaban  5 mg Oral BID  . Chlorhexidine Gluconate Cloth  6 each Topical Daily  . levothyroxine  175 mcg Oral Daily  . metoprolol tartrate  25 mg Oral BID  . pravastatin  40 mg Oral q1800   Continuous Infusions: . cefTRIAXone (ROCEPHIN)  IV 1 g (11/18/20 2047)  . dextrose 25 mL/hr at 11/19/20 1404  . levETIRAcetam Stopped (11/19/20 1035)       LOS: 2 days    Time spent: 30 minutes with greater than 50% spent at bedside and in coordination of care.    Pennie Banter, DO [facility]  11/19/2020, 5:10 PM      If 7PM-7AM, please contact night-coverage. How to contact the [facility] Attending or Consulting provider 7A - 7P or covering provider during after hours 7P -7A, for this patient?    1. Check the care team in [facility] and look for a) attending/consulting [facility] provider listed and b) the [facility] team listed 2. Log into [email or web address] and use [facility]'s universal password to access. If you  do not have the password, please contact the hospital operator. 3. Locate the [facility] provider you are looking for under [facility] and page to a number that you can be directly reached. 4. If you still have difficulty reaching the provider, please page the [facility] (Director on Call) for the Hospitalists listed on amion for assistance.

## 2020-11-19 NOTE — Progress Notes (Signed)
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 On 02.07, at [facility] she was noticed to have a seizure-like activity by the nursing staff followed byprolongedsomnolence, outside of her baseline.She had 2 additional episodes witnessed by her family and was thus sent to the emergency room for evaluation. Neurology recommended Keppra 250 twice daily. Family did not contact hospice prior to patient being transported to hospital. Patient was admitted to [facility] on 02.07 at 8:30pm with a diagnosis of AMS. Patient is a DNR. Per Dr. Dan Humphreys with [facility], this is a related admission.  Visited with patient and daughter, Meredith Shaw and RN, Sheria Lang at bedside. Patient was sleepy and did not arouse to verbal stimuli. Per RN, patient did not sleep last night and was given Zanaflex to help her relax. Her 10 am temp was 101.8 rectally and she was given a Tylenol Suppository. Temp had decreased to 97.8 at noon. She is still receiving IV dextrose to stabilize her BS. And still receiving IV Rocephin and Keppra for UTI and seizures, respectively.  VS: BP 104/55, HR 84, Resp 20, Temp 97.8, O2 sat 96 % on RA  I&O: 1,822ml/1,025ml  Abnormal labs: CO2: 21 (L) Glucose: 185 (H) Calcium: 8.5 (L) Iron: 9 (L) WBC: 17.8 (H) RBC: 2.94 (L) Hemoglobin: 7.9 (L) HCT: 25.7 (L) Platelets: 414 (H)  Diagnostics: None new  IV/PRN Meds: cefTRIAXone (ROCEPHIN) 1 g in sodium chloride 0.9 % 100 mL IVPB Dose: 1 g Freq: Every 24 hours Route: IV Last Dose: 1 g (11/18/20 2047) Start: 11/17/20 2015  dextrose 5 % solution Rate: 25 mL/hr Freq: Continuous Route: IV Start: 11/18/20 1030        levETIRAcetam (KEPPRA) IVPB 500 mg/100 mL premix Dose: 500 mg Freq: 2 times daily Route: IV Last Dose: 500 mg (11/19/20 1021) Start: 11/18/20 2000  tiZANidine  (ZANAFLEX) tablet 1 mg Dose: 1 mg Freq: 2 times daily PRN Route: PO PRN Reason: muscle spasms Start: 11/18/20 1214        Problem List: 1. Repeated hypoglycemia with type 2 diabetes on insulin and Glucophage as outpatient.  Placed on D5 drip.  Continue to watch sugars.  Hemoglobin A1c 6.6.  Recommend discontinuing insulin.  We will have to trend sugars before deciding on Metformin. 2. New onset seizure with history of stroke.  MRI did not show any acute stroke.  EEG pending.  Placed on Keppra.  Case discussed with neurology.  Daughter concerned about the violent movements of her right arm.  I tried a dose of Zanaflex.  Anticoagulated with Eliquis. 3. Acute cystitis with hematuria.  Placed empirically on Rocephin.  Follow-up urine culture. 4. Anemia.  Ferritin is high which goes along with anemia of chronic disease we will check B12 and TIBC tomorrow morning.  Continue watch while on Eliquis. 5. Hypothyroidism unspecified.  Check TSH tomorrow morning.  Continue levothyroxine 6. Essential hypertension on metoprolol 7. Hyperlipidemia unspecified on pravastatin 8. Paroxysmal atrial fibrillation on Eliquis to reduce stroke risk 9. Previous large stroke completed with left-sided paralysis  Discharge Planning: To discharge back to [facility] when medically stable  Family contact: Spoke with daughter, Meredith Shaw in room.  IDG: Updated  Goals of Care: clear  Patient remains GIP due to signs and symptoms relating to UTI. Being treated for UTI with IV Rocephin. Keppra for seizure treatment. Supportive Dextrose for stability of blood glucose.  Please call  with any hospice related questions or concerns.  Cyndra Numbers, RN [facility] Liaison [phone]

## 2020-11-19 NOTE — Progress Notes (Signed)
New order for rectal Tylenol by OD Denton Lank.

## 2020-11-19 NOTE — Progress Notes (Addendum)
Neurology Progress Note  Patient ID: Meredith Shaw is a 75 y.o. with PMHx of  has a past medical history of Arthritis, Cataract, Diabetes mellitus, Hypertension, Hypertensive retinopathy, Hypothyroidism, Kidney stones, Osteoporosis, and Paroxysmal atrial fibrillation (HCC) (08/25/2020).  Initially consulted for: Seizures   Major interval events:  -Febrile overnight -Received tizanidine at 5 AM for agitation and then was somnolent -Too sleepy to participate in SLP evaluation  Subjective: Minimally verbal  Exam: Vitals:   11/19/20 1438 11/19/20 2004  BP: (!) 81/69 102/64  Pulse: 83 88  Resp: 19 20  Temp: (!) 97.5 F (36.4 C) (!) 97.5 F (36.4 C)  SpO2: 94% 97%   Gen: In bed, does bang her right upper extremity but not as frequently/forcefully as yesterday Resp: non-labored breathing, no grossly audible wheezing Cardiac: Perfusing extremities well  Abd: soft, nt  Neuro: MS: Remains minimally verbal.  Does follow simple commands for me today such as stick out your tongue and squeeze my hand. CN: Today she has a clear right gaze preference that she can cross over to the left the rhythmic eye movements concerning for potential seizure are not present on my evaluation Motor: Stable from prior, left upper extremity contracture, moves the right lower extremity better than left lower extremity, purposeful with the right upper extremity though mostly uses it for banging on the bed Sensory: Equally reactive to touch in all 4 extremities  Pertinent Labs: Leukocytosis is improving to 17.1, as is thrombocytosis which is now 414 TSH normal at 0.446 Hemoglobin A1c 6.6 HIV negative  Impression: Patient's mental status appears to be improved today and she no longer has eye movements or concerning for seizure activity.  Recommendations: #Seizures -Continue Keppra 500 mg twice daily (ordered) -Appreciate primary team's treatment of possible UTI which could be lowering her seizure  threshold  # Rapid cognitive decline - Follow up B12, TSH, thiamine, RPR, - Empiric B12 supplementation, 1000 mcg p.o. daily - Empiric high-dose thiamine, 500 mg IV every 8 hours x3 days followed by 100 mcg IV daily to be transitioned to p.o. on discharge  Brooke Dare MD-PhD [facility] [phone]

## 2020-11-19 NOTE — Hospital Course (Addendum)
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 fib on Eliquis who presented to the ED on 11/17/2020 after having witnessed seizure-like activity at SNF followed by prolonged somnolence.  She had 2 additional episodes that were witnessed by family.    Of note, patient is currently on hospice for terminal diagnosis of cognitive decline secondary to her stroke.  Family do not want aggressive measures, prefer supportive care only.  ED patient was hypotensive with BP 78/50, low-grade temp of 90 9.12F, labs notable for leukocytosis 19 K hemoglobin 8.2, urinalysis questionable for UTI.  Admitted to hospitalist service with neurology consulted for further evaluation.  EEG was consistent with seizure activity.  Patient was started on Keppra for seizure prevention.  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

## 2020-11-19 NOTE — Progress Notes (Signed)
   11/19/20 0958  Assess: MEWS Score  Temp (!) 101.8 F (38.8 C)  Assess: MEWS Score  MEWS Temp 2  MEWS Systolic 1  MEWS Pulse 0  MEWS RR 0  MEWS LOC 0  MEWS Score 3  MEWS Score Color Yellow  Assess: if the MEWS score is Yellow or Red  Were vital signs taken at a resting state? Yes  Focused Assessment Change from prior assessment (see assessment flowsheet)  Early Detection of Sepsis Score *See Row Information* High  MEWS guidelines implemented *See Row Information* Yes  Treat  MEWS Interventions Escalated (See documentation below);Administered prn meds/treatments;Other (Comment) (New order for Rectal tylenol)  Pain Scale Faces  Pain Score 0  Facial Expression 0  Body Language 0  Consolability 0  Take Vital Signs  Increase Vital Sign Frequency  Yellow: Q 2hr X 2 then Q 4hr X 2, if remains yellow, continue Q 4hrs  Escalate  MEWS: Escalate Yellow: discuss with charge nurse/RN and consider discussing with provider and RRT  Notify: Charge Nurse/RN  Name of Charge Nurse/RN Notified Crystal Grogan  Date Charge Nurse/RN Notified 11/19/20  Time Charge Nurse/RN Notified 4481  Notify: Provider  Provider Name/Title OD Denton Lank  Date Provider Notified 11/19/20  Time Provider Notified [phone]  Notification Type  (Secure chat)  Notification Reason Change in status  Response See new orders  Date of Provider Response 11/19/20  Time of Provider Response 1005  Document  Patient Outcome Other (Comment) (Reassess after Tylenol)  Progress note created (see row info) Yes  Ice packs also placed on groin and neck of patient.

## 2020-11-19 NOTE — Progress Notes (Signed)
SLP Cancellation Note  Patient Details Name: [name] MRN: [id number] DOB: 1946-05-08   Cancelled treatment:       Reason Eval/Treat Not Completed: Patient's level of consciousness;Fatigue/lethargy limiting ability to participate (chart reviewed; consulted NSG then met w/ pt's Dtr). 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 On 02.07.2022, at [facility], she was noticed to have a seizure-like activity by the nursing staff followed byprolongedsomnolence, outside of her baseline.Family did not contact Hospice prior to pt being transported to hospital. Currently she is lethargic after being given meds d/t agitation "all last night". BSE was held d/t poor arousal. Discussed at length pt's decreased oral phase abilities w/ solid foods, her decline to eating "1 meal a day" per Dtr, food consistencies, aspiration precautions. Pt is currently on a Regular diet per orders at admit -- this diet was modified to a Dysphagia level 2(MINCED foods w/ purees), thin liquids. Dtr stated pt did not have difficulty drinking "even w/ a straw". NSG noted adequate oropharyngeal swallowing yesterday w/ a "few" po's given.  ST services will f/u tomorrow w/ ongoing assessment and education as indicated. General aspiration precautions posted in chart, room. Pt takes her Pills Crushed in puree baseline. Dtr agreed w/ above; oral care education given also. ST will f/u tomorrow. NSG updated.       Orinda Kenner, MS, CCC-SLP Speech Language Pathologist [facility] [phone] [facility] 11/19/2020, 2:38 PM

## 2020-11-20 DIAGNOSIS — N183 Chronic kidney disease, stage 3 unspecified: Secondary | ICD-10-CM

## 2020-11-20 DIAGNOSIS — I69359 Hemiplegia and hemiparesis following cerebral infarction affecting unspecified side: Secondary | ICD-10-CM

## 2020-11-20 DIAGNOSIS — E1122 Type 2 diabetes mellitus with diabetic chronic kidney disease: Secondary | ICD-10-CM

## 2020-11-20 LAB — GLUCOSE, CAPILLARY
Glucose-Capillary: 106 mg/dL — ABNORMAL HIGH (ref 70–99)
Glucose-Capillary: 105 mg/dL — ABNORMAL HIGH (ref 70–99)
Glucose-Capillary: 99 mg/dL (ref 70–99)
Glucose-Capillary: 146 mg/dL — ABNORMAL HIGH (ref 70–99)
Glucose-Capillary: 142 mg/dL — ABNORMAL HIGH (ref 70–99)
Glucose-Capillary: 128 mg/dL — ABNORMAL HIGH (ref 70–99)

## 2020-11-20 LAB — CBC
HCT: 24 % — ABNORMAL LOW (ref 36.0–46.0)
Hemoglobin: 7.6 g/dL — ABNORMAL LOW (ref 12.0–15.0)
MCH: 26.6 pg (ref 26.0–34.0)
MCHC: 31.7 g/dL (ref 30.0–36.0)
MCV: 83.9 fL (ref 80.0–100.0)
Platelets: 386 10*3/uL (ref 150–400)
RBC: 2.86 MIL/uL — ABNORMAL LOW (ref 3.87–5.11)
RDW: 14.3 % (ref 11.5–15.5)
WBC: 14.6 10*3/uL — ABNORMAL HIGH (ref 4.0–10.5)
nRBC: 0 % (ref 0.0–0.2)

## 2020-11-20 LAB — BASIC METABOLIC PANEL
Anion gap: 10 (ref 5–15)
BUN: 18 mg/dL (ref 8–23)
CO2: 20 mmol/L — ABNORMAL LOW (ref 22–32)
Calcium: 8.4 mg/dL — ABNORMAL LOW (ref 8.9–10.3)
Chloride: 108 mmol/L (ref 98–111)
Creatinine, Ser: 0.71 mg/dL (ref 0.44–1.00)
GFR, Estimated: >60 mL/min (ref >60)
Glucose, Bld: 122 mg/dL — ABNORMAL HIGH (ref 70–99)
Potassium: 3.4 mmol/L — ABNORMAL LOW (ref 3.5–5.1)
Sodium: 138 mmol/L (ref 135–145)

## 2020-11-20 LAB — RPR: RPR Ser Ql: NONREACTIVE

## 2020-11-20 NOTE — Progress Notes (Signed)
Neurology Progress Note  Patient ID: Meredith Shaw is a 75 y.o. with PMHx of  has a past medical history of Arthritis, Cataract, Diabetes mellitus, Hypertension, Hypertensive retinopathy, Hypothyroidism, Kidney stones, Osteoporosis, and Paroxysmal atrial fibrillation (HCC) (08/25/2020).  Initially consulted for: Seizures   Major interval events:  -Missed a dose of Keppra due to "message that order is not active" per nursing -No as needed medications required for agitation overnight -Trending towards low-grade fever again this morning  Subjective: Minimally verbal  Exam: Vitals:   11/20/20 0434 11/20/20 0817  BP: 120/64 122/70  Pulse: 89 82  Resp: 20 18  Temp: 98.4 F (36.9 C) 99.3 F (37.4 C)  SpO2: 96% 98%   Gen: In bed, does bang her right upper extremity but not as frequently/forcefully as yesterday, improving daily Resp: non-labored breathing, no grossly audible wheezing Cardiac: Perfusing extremities well  Abd: soft, nt  Neuro: MS: Says "Hi" and okay to her daughter today. More briskly follows simple commands for me today such as stick out your tongue  CN: Today she has a clear right gaze preference and though she can cross over to the left, the rhythmic eye movements concerning for potential seizure on initial evaluation remain absent Motor: Stable from prior, left upper extremity contracture, moves the right lower extremity better than left lower extremity, purposeful with the right upper extremity though mostly uses it for banging on the bed Sensory: Equally reactive to touch in all 4 extremities Plantar: Upgoing left toe, downgoing right  Pertinent Labs: Leukocytosis is improving to 17.1, as is thrombocytosis which is now 414 TSH normal at 0.446 Hemoglobin A1c 6.6 HIV negative RPR negative   Impression: Patient's mental status appears to be continuing to improve today and she no longer has eye movements or concerning for seizure activity.  She appears to be  well controlled on Keppra 500 mg twice daily.  Discussed with hospice nurse at bedside.should she have concern for recurrent seizure activity such as rhythmic left gaze deviation, this dose could be increased further.  At this time will hold at 500 mg twice daily to avoid oversedation.  Recommendations: # Rapid cognitive decline - Follow up B12, thiamine, levels - Empiric B12 supplementation, 1000 mcg p.o. daily - Empiric high-dose thiamine, 500 mg IV every 8 hours x3 days followed by 100 mcg IV daily to be transitioned to p.o. on discharge  #Seizures -Continue Keppra 500 mg twice daily  -Appreciate primary team's treatment of possible UTI which could be lowering her seizure threshold -No further neurological work-up indicated at this time, neurology will be available on an as-needed basis going forward  Brooke Dare MD-PhD [facility] [phone]

## 2020-11-20 NOTE — Progress Notes (Signed)
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 On 02.07, at [facility] she was noticed to have a seizure-like activity by the nursing staff followed byprolongedsomnolence, outside of her baseline.She had 2 additional episodes witnessed by her family and was thus sent to the emergency room for evaluation. Neurology recommended Keppra 250 twice daily. Family did not contact hospice prior to patient being transported to hospital. Patient was admitted to [facility] on 02.07 at 8:30pm with a diagnosis of AMS. Patient is a DNR. Per Dr. Dan Humphreys with [facility], this is a related admission.  Visited with patient and daughter, Meredith Shaw and Dr. Iver Nestle with Neurology in room. Patient is resting comfortably and will arouse to verbal stimuli with minimal verbal response. Per Dr. Iver Nestle, patient no longer has eye movements concerning for seizure activity. She is pleased with the response to the Keppra dose and can be continued at facility. Daughter agreeable with plan.  Initial urine culture grew multiple species concerning for contamination. Patient still with periods of fever. IV Rocephin to continue for now with a repeat urine culture. D5W infusion to continue with glucose monitoring. Plan to D/C drip possibly today.   VS: BP 95/51, HR 90, Resp 18, Temp 99.8, O2 sat 97% on RA  I&O: 344ml/250ml  Abnormal labs: Potassium: 3.4 (L) CO2: 20 (L) Glucose: 122 (H) Calcium: 8.4 (L) WBC: 14.6 (H) RBC: 2.86 (L) Hemoglobin: 7.6 (L) HCT: 24.0 (L)  Diagnostics: none new  IV/PRN meds: thiamine 500mg  in normal saline (70ml) IVPB Dose: 500 mg Freq: Every 8 hours Route: IV Start: 11/20/20 0100 End: 11/22/20 2159  cefTRIAXone (ROCEPHIN) 1 g in sodium chloride 0.9 % 100 mL IVPB Dose: 1 g Freq: Every 24 hours Route: IV Last Dose: Stopped  (11/19/20 2119) Start: 11/17/20 2015 End: 11/21/20 2359  dextrose 5 % solution Rate: 25 mL/hr Freq: Continuous Route: IV Last Dose: Stopped (11/20/20 0605) Start: 11/18/20 1030  levETIRAcetam (KEPPRA) IVPB 500 mg/100 mL premix Dose: 500 mg Freq: 2 times daily Route: IV Last Dose: Stopped (11/20/20 0731) Start: 11/18/20 2000  Problem List: Principal Problem:   New onset seizure (HCC) Active Problems:   Postablative hypothyroidism   CKD stage 3 due to type 2 diabetes mellitus (HCC)   Type 2 diabetes mellitus with complication, without long-term current use of insulin (HCC)   UTI (urinary tract infection)   Hyperlipidemia   Dysphagia, post-stroke   Essential hypertension   Paroxysmal atrial fibrillation (HCC)   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  Rectal Tylenol ordered as patient somnolent. Currently on Rocephin empirically for UTI.  UTI -POA.  Initial urine culture grew multiple species concerning for contamination however patient febrile today as above.  Continue Rocephin for now.  Repeat urine culture.  Hypoglycemia -continue D5W infusion and glucose monitoring.   Can likely try off the drip tomorrow and see how she does.  Suspect this is due to poor p.o. intake.  Insulin-dependent type 2 diabetes -takes Metformin and basal insulin at home.  Last A1c 6.6% Likely patient no longer needs insulin, consider discontinuing at discharge.   Watch CBGs further before deciding whether safe to continue Metformin.  New onset seizure /post stroke epilepsy -MRI was negative for any new acute  stroke.  EEG was consistent with epileptogenic city for for arising from the right frontal region along with cortical dysfunction of the right hemisphere likely due to underlying stroke, postictal state, moderate diffuse encephalopathy which is  nonspecific. --Neurology consulting, appreciate recommendations --Continue Keppra 500 mg twice daily  Cognitive decline -course has been fairly rapid since patient stroke.   --Follow-up on pending B12, TSH, thiamine, RPR, HIV  History of right MCA stroke with left-sided hemiplegia-in July 2021.  MRI brain this admission did not show any new or more recent stroke from prior.  Neurology is following --Continue Eliquis  Hypothyroidism -continue levothyroxine  Essential hypertension -on metoprolol  Hyperlipidemia -on pravastatin  Paroxysmal A. Fib -rate currently controlled. Continue Eliquis.  Discharge Planning: Back to [facility]  Family Contact: Spoke with daughter, Meredith Shaw  IDG: Updated  Goals of Care: clear  Patient remains GIP for treatment of UTI and seizure activity with IV Keppra and Rocephin daily and new IV Thiamine Q 8hrs.  Please call with any hospice related questions or concerns.  Cyndra Numbers, RN [facility] Liaison [phone]

## 2020-11-20 NOTE — Progress Notes (Addendum)
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 fib on Eliquis who presented to the ED on 11/17/2020 after having witnessed seizure-like activity at SNF followed by prolonged somnolence.  She had 2 additional episodes that were witnessed by family.    Of note, patient is currently on hospice for terminal diagnosis of cognitive decline secondary to her stroke.  Family do not want aggressive measures, prefer supportive care only.  ED patient was hypotensive with BP 78/50, low-grade temp of 90 9.61F, labs notable for leukocytosis 19 K hemoglobin 8.2, urinalysis questionable for UTI.  Admitted to hospitalist service with neurology consulted for further evaluation.  EEG was consistent with seizure activity.  Patient was started on Keppra for seizure prevention.  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  Is currently still receiving IV antibiotics for UTI and is on dextrose infusion for hypoglycemia.    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  Rectal Tylenol ordered as patient somnolent. Currently on  Rocephin empirically for UTI.  Sepsis -present on admission as evidenced by hypotension, leukocytosis, UA consistent with infection.  Management as below.  UTI -POA.  Initial urine culture grew multiple species concerning for contamination however patient febrile today as above.  Continue Rocephin for now.  Follow-up repeat urine culture.  Hypoglycemia -continue D5W infusion and glucose monitoring.   Can likely try off the drip tomorrow and see how she does.  Suspect this is due to poor p.o. intake.  Insulin-dependent type 2 diabetes -takes Metformin and basal insulin at home.  Last A1c 6.6% Likely patient no longer needs insulin, consider discontinuing at discharge.   Watch CBGs further before deciding whether safe to continue Metformin.  Severe protein calorie malnutrition - pt bedridden, with dysphagia, reduced alertness, inadequate PO intake since stroke and weight loss over 15% body wt prior 6 months, albumin 1.7. --Dietician consulted, SLP is following --Nutritional support per orders --Pt followed by hospice  New onset seizure /post stroke epilepsy -MRI was negative for any new acute stroke.  EEG was consistent with epileptogenic city for for arising from the right frontal region along with cortical dysfunction of the right hemisphere likely due to underlying stroke, postictal state, moderate diffuse encephalopathy which is nonspecific. --Neurology consulting, appreciate recommendations --Continue Keppra 500 mg twice daily  Cognitive decline -course has been fairly rapid since patient stroke.   --Follow-up on pending B12, TSH, thiamine, RPR, HIV  History of right MCA stroke with left-sided hemiplegia-in July 2021.  MRI brain this admission did not show any new or more recent stroke from prior.  Neurology is following --Continue Eliquis  Hypothyroidism -continue levothyroxine  Essential hypertension -on metoprolol  Hyperlipidemia -on pravastatin  Paroxysmal A. Fib -rate  currently controlled.  Continue Eliquis.  Hospice patient -patient is followed by hospice outpatient due to her rapid of cognitive and functional decline since her stroke.  They will resume following patient after this admission I have discussed her case with hospice liaison here.  Patient BMI: Body mass index is 27.93 kg/m.   DVT prophylaxis:  apixaban (ELIQUIS) tablet 5 mg   Diet:  Diet Orders (From admission, onward)     Start     Ordered   11/19/20 1435  DIET DYS 2 Room service appropriate? Yes with Assist; Fluid consistency: Thin  Diet effective now       Comments: Extra Gravy on meats, potatoes. Yogurt, Cream Soups, Puddings, hot cereals w/ sugar at meals.  Question Answer Comment  Room service appropriate? Yes with Assist   Fluid consistency: Thin      11/19/20 1437              Code Status: DNR    Subjective 11/20/20    Patient seen with daughter at bedside today.  Patient again is somnolent in minimally responsive to physical stimulus.  Does not open eyes or follow other commands.  Daughter reports patient has been more agitated when due to having a roommate and noise on that side of the room.  Patient is agitated the right arm movements become more frequent and exacerbated.  They are little better right now, the patient does intermittently jerk the right arm.  Disposition Plan & Communication   Status is: Inpatient  Remains inpatient appropriate because:Inpatient level of care appropriate due to severity of illness, on IV antibiotics as above, persistent encephalopathy.   Dispo: The patient is from: SNF              Anticipated d/c is to: SNF              Anticipated d/c date is: 2 days              Patient currently is not medically stable to d/c.   Difficult to place patient No   Family Communication: None at bedside, will attempt to call this afternoon   Consults, Procedures, Significant Events   Consultants:  Neurology  Procedures:   EEG  Antimicrobials:  Anti-infectives (From admission, onward)    Start     Dose/Rate Route Frequency Ordered Stop   11/17/20 2245  cefTRIAXone (ROCEPHIN) 1 g in sodium chloride 0.9 % 100 mL IVPB  Status:  Discontinued        1 g 200 mL/hr over 30 Minutes Intravenous Every 24 hours 11/17/20 2159 11/17/20 2242   11/17/20 2015  cefTRIAXone (ROCEPHIN) 1 g in sodium chloride 0.9 % 100 mL IVPB        1 g 200 mL/hr over 30 Minutes Intravenous Every 24 hours 11/17/20 2006 11/21/20 2359   11/17/20 1815  cefTRIAXone (ROCEPHIN) 2 g in sodium chloride 0.9 % 100 mL IVPB        2 g 200 mL/hr over 30 Minutes Intravenous  Once 11/17/20 1802 11/17/20 2050         Objective   Vitals:   11/20/20 0817 11/20/20 1153 11/20/20 1605 11/20/20 2208  BP: 122/70 (!) 95/51 109/82 121/63  Pulse: 82 90 93 92  Resp: 18   18  Temp: 99.3 F (37.4 C) 99.8 F (37.7 C) 99.9 F (37.7 C) 98.4 F (36.9 C)  TempSrc: Oral Oral Oral Oral  SpO2: 98% 97% 94% 97%  Weight:      Height:        Intake/Output Summary (Last 24 hours) at 11/20/2020 2243 Last data filed at 11/20/2020 1350 Gross per 24 hour  Intake 666.29 ml  Output --  Net 666.29 ml   Filed Weights   11/18/20 0346 11/19/20 0350 11/20/20 0557  Weight: 63.5 kg 73.9 kg 73.8 kg    Physical Exam:  General exam: somnolent, no acute distress, grimaces to sternal rub but does not open eyes (unchanged from yesterday) Respiratory system: CTAB, no wheezes, rales or rhonchi, normal respiratory effort. Cardiovascular system: normal S1/S2, RRR, no pedal edema.   Gastrointestinal system: soft, NT, ND, +bowel sounds. Central nervous system: Somnolent, minimally responsive, exam limited by pt condition Skin: Pale, warm distal extremities dry, intact   Labs   Data Reviewed: I have personally reviewed following labs and imaging studies  CBC: Recent Labs  Lab 11/17/20 1510 11/19/20 0523 11/20/20 0616  WBC 19.3* 17.8* 14.6*  NEUTROABS 17.3*  --   --    HGB 8.2* 7.9* 7.6*  HCT 27.5* 25.7* 24.0*  MCV 88.1 87.4 83.9  PLT 492* 414* 386   Basic Metabolic Panel: Recent Labs  Lab 11/17/20 1510 11/19/20 0523 11/20/20 0616  NA 142 139 138  K 3.6 3.6 3.4*  CL 107 106 108  CO2 25 21* 20*  GLUCOSE 89 185* 122*  BUN 38* 20 18  CREATININE 1.07* 0.74 0.71  CALCIUM 9.0 8.5* 8.4*   GFR: Estimated Creatinine Clearance: 60.7 mL/min (by C-G formula based on SCr of 0.71 mg/dL). Liver Function Tests: Recent Labs  Lab 11/17/20 1510  AST 14*  ALT 7  ALKPHOS 89  BILITOT 0.4  PROT 5.9*  ALBUMIN 1.7*   Recent Labs  Lab 11/17/20 1510  LIPASE 22   No results for input(s): AMMONIA in the last 168 hours. Coagulation Profile: No results for input(s): INR, PROTIME in the last 168 hours. Cardiac Enzymes: No results for input(s): CKTOTAL, CKMB, CKMBINDEX, TROPONINI in the last 168 hours. BNP (last 3 results) No results for input(s): PROBNP in the last 8760 hours. HbA1C: No results for input(s): HGBA1C in the last 72 hours. CBG: Recent Labs  Lab 11/20/20 0602 11/20/20 0827 11/20/20 1156 11/20/20 2025 11/20/20 2138  GLUCAP 105* 99 146* 142* 128*   Lipid Profile: No results for input(s): CHOL, HDL, LDLCALC, TRIG, CHOLHDL, LDLDIRECT in the last 72 hours. Thyroid Function Tests: Recent Labs    11/19/20 0523  TSH 0.446   Anemia Panel: Recent Labs    11/19/20 0523  VITAMINB12 333  TIBC NOT CALCULATED  IRON 9*   Sepsis Labs: No results for input(s): PROCALCITON, LATICACIDVEN in the last 168 hours.  Recent Results (from the past 240 hour(s))  Urine culture     Status: Abnormal   Collection Time: 11/17/20  2:53 PM   Specimen: Urine, Random  Result Value Ref Range Status   Specimen Description   Final    URINE, RANDOM Performed at [facility], [address], [place], [place] [address]    Special Requests   Final    NONE Performed at [facility], [address] [place], [place] [address]     Culture MULTIPLE SPECIES PRESENT, SUGGEST RECOLLECTION (A)  Final   Report Status 11/19/2020 FINAL  Final  Blood culture (single)     Status: None (Preliminary result)   Collection Time: 11/17/20  6:39 PM  Specimen: BLOOD  Result Value Ref Range Status   Specimen Description BLOOD RIGHT ANTECUBITAL  Final   Special Requests   Final    BOTTLES DRAWN AEROBIC AND ANAEROBIC Blood Culture adequate volume   Culture   Final    NO GROWTH 3 DAYS Performed at [facility], [address], [place], [place] [address]    Report Status PENDING  Incomplete  SARS CORONAVIRUS 2 (TAT 6-24 HRS) Nasopharyngeal Nasopharyngeal Swab     Status: None   Collection Time: 11/17/20  6:50 PM   Specimen: Nasopharyngeal Swab  Result Value Ref Range Status   SARS Coronavirus 2 NEGATIVE NEGATIVE Final    Comment: (NOTE) SARS-CoV-2 target nucleic acids are NOT DETECTED.  The SARS-CoV-2 RNA is generally detectable in upper and lower respiratory specimens during the acute phase of infection. Negative results do not preclude SARS-CoV-2 infection, do not rule out co-infections with other pathogens, and should not be used as the sole basis for treatment or other patient management decisions. Negative results must be combined with clinical observations, patient history, and epidemiological information. The expected result is Negative.  Fact Sheet for Patients: HairSlick.no  Fact Sheet for Healthcare Providers: quierodirigir.com  This test is not yet approved or cleared by the [place] FDA and  has been authorized for detection and/or diagnosis of SARS-CoV-2 by FDA under an Emergency Use Authorization (EUA). This EUA will remain  in effect (meaning this test can be used) for the duration of the COVID-19 declaration under Se ction 564(b)(1) of the Act, 21 U.S.C. section 360bbb-3(b)(1), unless the authorization is terminated or revoked  sooner.  Performed at [facility], [address] [place], [place] [address]       Imaging Studies   No results found.   Medications   Scheduled Meds:  apixaban  5 mg Oral BID   Chlorhexidine Gluconate Cloth  6 each Topical Daily   levothyroxine  175 mcg Oral Daily   metoprolol tartrate  25 mg Oral BID   pravastatin  40 mg Oral q1800   [START ON 11/22/2020] thiamine injection  100 mg Intravenous Q24H   vitamin B-12  1,000 mcg Oral Daily   Continuous Infusions:  cefTRIAXone (ROCEPHIN)  IV 1 g (11/20/20 2230)   dextrose 25 mL/hr at 11/20/20 1430   levETIRAcetam 500 mg (11/20/20 2032)   thiamine injection 500 mg (11/20/20 1431)       LOS: 3 days    Time spent: 25 minutes with greater than 50% spent at bedside and in coordination of care.    Pennie Banter, DO [facility]  11/20/2020, 10:43 PM      If 7PM-7AM, please contact night-coverage. How to contact the [facility] Attending or Consulting provider 7A - 7P or covering provider during after hours 7P -7A, for this patient?    Check the care team in [facility] and look for a) attending/consulting [facility] provider listed and b) the [facility] team listed Log into [email or web address] and use [facility] universal password to access. If you do not have the password, please contact the hospital operator. Locate the [facility] provider you are looking for under [facility] and page to a number that you can be directly reached. If you still have difficulty reaching the provider, please page the [facility] (Director on Call) for the Hospitalists listed on amion for assistance.

## 2020-11-20 NOTE — Care Management Important Message (Signed)
Important Message  Patient Details  [name] MRN: [id number] Date of Birth: 10/02/1946   Medicare Important Message Given:  Yes     [name] 11/20/2020, 2:04 PM

## 2020-11-20 NOTE — Evaluation (Signed)
Clinical/Bedside Swallow Evaluation Patient Details  [name] MRN: [id number] Date of Birth: [birth year]  Today's Date: 11/20/2020 Time: SLP Start Time (ACUTE ONLY): 1100 SLP Stop Time (ACUTE ONLY): 1145 SLP Time Calculation (min) (ACUTE ONLY): 45 min  Past Medical History:  Past Medical History:  Diagnosis Date  . Arthritis    OA AND PAIN BOTH KNEES -RIGHT KNEE HURTS WORSE THAN LEFT  . Cataract   . Diabetes mellitus    ORAL MEDICATION - NO INSULIN  . Hypertension   . Hypertensive retinopathy    OU  . Hypothyroidism   . Kidney stones    NONE AT PRESENT TIME THAT PT IS AWARE OF  . Osteoporosis   . Paroxysmal atrial fibrillation (HCC) 08/25/2020   Past Surgical History:  Past Surgical History:  Procedure Laterality Date  . ABDOMINAL HYSTERECTOMY    . BUBBLE STUDY  03/26/2020   Procedure: BUBBLE STUDY;  Surgeon: Lars Masson, MD;  Location: [facility] ENDOSCOPY;  Service: Cardiovascular;;  . CATARACT EXTRACTION    . CHOLECYSTECTOMY  1972  . COLONOSCOPY    . EYE SURGERY     RIGHT CATARACT EXTRACTON WITH LENS IMPLANT  . GROWTH REMOVED FROM THYROID    . INTRAMEDULLARY (IM) NAIL INTERTROCHANTERIC Left 02/10/2018   Procedure: INTRAMEDULLARY (IM) NAIL INTERTROCHANTRIC;  Surgeon: Kathryne Hitch, MD;  Location: [facility] OR;  Service: Orthopedics;  Laterality: Left;  . INTRAMEDULLARY (IM) NAIL INTERTROCHANTERIC Right 08/20/2018   Procedure: ORIF RIGHT HIP INTERTROC FRACTURE;  Surgeon: Kathryne Hitch, MD;  Location: [facility] OR;  Service: Orthopedics;  Laterality: Right;  . IR CT HEAD LTD  03/24/2020  . IR PERCUTANEOUS ART THROMBECTOMY/INFUSION INTRACRANIAL INC DIAG ANGIO  03/24/2020  . left hip surgery   02/2018  . LOOP RECORDER INSERTION N/A 04/11/2020   Procedure: LOOP RECORDER INSERTION;  Surgeon: Regan Lemming, MD;  Location: [facility] INVASIVE CV LAB;  Service: Cardiovascular;  Laterality: N/A;  . ORIF HIP FRACTURE Right 08/20/2018  . PERCUTANEOUS NEPHROLITHOTOMY     . RADIOLOGY WITH ANESTHESIA N/A 03/23/2020   Procedure: IR WITH ANESTHESIA;  Surgeon: Julieanne Cotton, MD;  Location: [facility] OR;  Service: Radiology;  Laterality: N/A;  . TEE WITHOUT CARDIOVERSION N/A 03/26/2020   Procedure: TRANSESOPHAGEAL ECHOCARDIOGRAM (TEE);  Surgeon: Lars Masson, MD;  Location[facility] ENDOSCOPY;  Service: Cardiovascular;  Laterality: N/A;  . TOTAL KNEE ARTHROPLASTY  06/09/2012   Procedure: TOTAL KNEE ARTHROPLASTY;  Surgeon: Kathryne Hitch, MD;  Location: [facility] ORS;  Service: Orthopedics;  Laterality: Right;  Right Total Knee Arthroplasty  . TOTAL KNEE ARTHROPLASTY Left 04/09/2014   Procedure: LEFT TOTAL KNEE ARTHROPLASTY;  Surgeon: Kathryne Hitch, MD;  Location: [facility] OR;  Service: Orthopedics;  Laterality: Left;  . TUBAL LIGATION     HPI:  75yo female admitted 11/17/20 with seizure like activity. PMH: HTN, DM, post ablative hypothyroidism, Large RCVA (2021) with left hemiplegia, PAFib. On Hospice   Assessment / Plan / Recommendation Clinical Impression  Pt was seen for limited BSE, due to cognitive impairment. Pt was sleeping upon arrival of SLP. Daughter was present, and assisted SLP with waking pt for PO trials. Pt is edentulous, and nonvocal. She does not follow commands, but accepted trials of thin liquid and puree textures. No obvious oral issues, and no overt s/s aspiration observed. Will continue current diet (dys2/thin liquids). Education was completed with recommendation for upright positioning and feeding only when pt is awake. Pt acceptance of PO intake is highly variable, but she appears to  tolerate trials without difficulty. Safe swallow precautions posted at [facility], and RN informed. SLP will sign off at this time. Please reconsult if needs arise.   SLP Visit Diagnosis: Dysphagia, unspecified (R13.10)    Aspiration Risk  Mild aspiration risk;Risk for inadequate nutrition/hydration    Diet Recommendation Dysphagia 2 (Fine chop);Dysphagia 1 (Puree);Thin  liquid   Liquid Administration via: Straw Medication Administration: Crushed with puree Supervision: Full supervision/cueing for compensatory strategies Postural Changes: Seated upright at 90 degrees;Remain upright for at least 30 minutes after po intake    Other  Recommendations Oral Care Recommendations: Oral care prior to ice chip/H20   Follow up Recommendations None          Prognosis  Guarded - pt on hospice prior to admit     Swallow Study   General Date of Onset: 11/17/20 HPI: 75yo female admitted 11/17/20 with seizure like activity. PMH: HTN, DM, post ablative hypothyroidism, Large RCVA (2021) with left hemiplegia, PAFib. On Hospice Type of Study: Bedside Swallow Evaluation Previous Swallow Assessment: 04/12/20 - Dys1/HTL Diet Prior to this Study: Dysphagia 2 (chopped);Thin liquids Temperature Spikes Noted: No Respiratory Status: Room air History of Recent Intubation: No Behavior/Cognition: Alert;Cooperative;Doesn't follow directions;Requires cueing Oral Cavity Assessment: Within Functional Limits Oral Care Completed by SLP: No Oral Cavity - Dentition: Edentulous;Dentures, not available Self-Feeding Abilities: Total assist Patient Positioning: Upright in bed Baseline Vocal Quality:  (nonvocal) Volitional Cough: Cognitively unable to elicit Volitional Swallow: Unable to elicit    Oral/Motor/Sensory Function Overall Oral Motor/Sensory Function:  (unable to fully assess)      Thin Liquid Thin Liquid: Within functional limits Presentation: Straw    Puree Puree: Within functional limits Presentation: Spoon    Celia B. Murvin Natal [facility], CCC-SLP Speech Language Pathologist [phone]  [name] 11/20/2020,12:00 PM

## 2020-11-20 NOTE — Plan of Care (Signed)

## 2020-11-20 NOTE — Progress Notes (Signed)
Attempted to give patient morning medications. Patient able to arouse but not alert enough to take oral medications. Medications held at this time. Will notify OD [facility] Patient vitals WDL at this time. Will continue to monitor.

## 2020-11-21 LAB — BASIC METABOLIC PANEL
Anion gap: 7 (ref 5–15)
BUN: 16 mg/dL (ref 8–23)
CO2: 24 mmol/L (ref 22–32)
Calcium: 8.5 mg/dL — ABNORMAL LOW (ref 8.9–10.3)
Chloride: 109 mmol/L (ref 98–111)
Creatinine, Ser: 0.81 mg/dL (ref 0.44–1.00)
GFR, Estimated: >60 mL/min (ref >60)
Glucose, Bld: 132 mg/dL — ABNORMAL HIGH (ref 70–99)
Potassium: 3.4 mmol/L — ABNORMAL LOW (ref 3.5–5.1)
Sodium: 140 mmol/L (ref 135–145)

## 2020-11-21 LAB — URINE CULTURE

## 2020-11-21 LAB — MAGNESIUM: Magnesium: 1.2 mg/dL — ABNORMAL LOW (ref 1.7–2.4)

## 2020-11-21 LAB — CBC
HCT: 24.3 % — ABNORMAL LOW (ref 36.0–46.0)
Hemoglobin: 7.4 g/dL — ABNORMAL LOW (ref 12.0–15.0)
MCH: 25.8 pg — ABNORMAL LOW (ref 26.0–34.0)
MCHC: 30.5 g/dL (ref 30.0–36.0)
MCV: 84.7 fL (ref 80.0–100.0)
Platelets: 427 10*3/uL — ABNORMAL HIGH (ref 150–400)
RBC: 2.87 MIL/uL — ABNORMAL LOW (ref 3.87–5.11)
RDW: 14.3 % (ref 11.5–15.5)
WBC: 10.8 10*3/uL — ABNORMAL HIGH (ref 4.0–10.5)
nRBC: 0 % (ref 0.0–0.2)

## 2020-11-21 LAB — GLUCOSE, CAPILLARY
Glucose-Capillary: 121 mg/dL — ABNORMAL HIGH (ref 70–99)
Glucose-Capillary: 128 mg/dL — ABNORMAL HIGH (ref 70–99)
Glucose-Capillary: 129 mg/dL — ABNORMAL HIGH (ref 70–99)
Glucose-Capillary: 193 mg/dL — ABNORMAL HIGH (ref 70–99)

## 2020-11-21 MED ORDER — MAGNESIUM SULFATE 4 GM/100ML IV SOLN
4.0000 g | Freq: Once | INTRAVENOUS | Status: AC
  Administered 2020-11-21: 4 g via INTRAVENOUS
  Filled 2020-11-21: qty 100

## 2020-11-21 MED ORDER — LORAZEPAM 2 MG/ML IJ SOLN: 0.5000 mg | INTRAMUSCULAR | Status: DC | PRN

## 2020-11-21 MED ORDER — MORPHINE SULFATE (PF) 2 MG/ML IV SOLN
1.0000 mg | INTRAVENOUS | Status: DC | PRN
  Administered 2020-11-21: 1 mg via INTRAVENOUS
  Filled 2020-11-21: qty 1

## 2020-11-21 MED ORDER — ADULT MULTIVITAMIN W/MINERALS CH: 1.0000 | ORAL_TABLET | Freq: Every day | ORAL | Status: DC

## 2020-11-21 MED ORDER — ENSURE ENLIVE PO LIQD: 237.0000 mL | Freq: Two times a day (BID) | ORAL | Status: DC

## 2020-11-21 NOTE — Progress Notes (Signed)
[facility] Room 244 [facility]) Hospital Liaison Hospitalized Hospice patient RN note:  Meredith Shaw is a current hospice patient with a terminal diagnosis of cognitive decline related to cerebral infarction. On 02.07,at [facility] she was noticed to have a seizure-like activity by the nursing staff followed byprolongedsomnolence, outside of her baseline.She had 2 additional episodes witnessed by her family and was thus sent to the emergency room for evaluation. Neurology recommended Keppra 250 twice daily. Family did not contact hospice prior to patient being transported to hospital. Patient was admitted to [facility] on 02.07 at 8:30pm with a diagnosis of AMS. Patient is a DNR. Per Dr. Dan Humphreys with [facility], this is a related admission.  Visited with patient and daughter, Meredith Shaw in room. Patient is resting with eyes closed and does not awaken to stimuli. Conversation related to GOC and expectations had between daughter and Dr. Denton Lank. Despite treatment, patient has continued to decline. She has not had any PO intake and not been able to take PO meds. Family does not want any aggressive measures and requests supportive care only. She will transition to comfort care with comfort medications. Discussed with Hospice Home and plan is to transfer tomorrow to the Hospice Home. Daughter, Meredith Shaw will complete needed paperwork.  VS: BP 88/67, HR 65, Resp 18, Temp 98.9, O2 sat 90% on RA  I&O: 26ml/ none charted  Abnormal labs: Potassium: 3.4 (L) Glucose: 132 (H) Calcium: 8.5 (L) Magnesium: 1.2 (L) WBC: 10.8 (H) RBC: 2.87 (L) Hemoglobin: 7.4 (L) HCT: 24.3 (L) MCH: 25.8 (L) Platelets: 427 (H)  Diagnostics: none new  IV/PRN meds: thiamine 500mg  in normal saline (27ml) IVPB Dose: 500 mg Freq: Every 8 hours Route: IV Last Dose: Stopped (11/21/20 1419) Start: 11/20/20 0100 End: 11/21/20 1323  vitamin B-12 (CYANOCOBALAMIN) tablet 1,000 mcg Dose: 1,000 mcg Freq: Daily Route:  PO Start: 11/20/20 1000 End: 11/21/20 1323  levETIRAcetam (KEPPRA) IVPB 500 mg/100 mL premix Dose: 500 mg Freq: 2 times daily Route: IV Last Dose: 500 mg (11/21/20 0822) Start: 11/18/20 2000  magnesium sulfate IVPB 4 g 100 mL Dose: 4 g Freq: Once Route: IV Last Dose: 4 g (11/21/20 1155) Start: 11/21/20 1100 End: 11/21/20 1355  Problem List: Principal Problem:   New onset seizure (HCC) Active Problems:   Postablative hypothyroidism   CKD stage 3 due to type 2 diabetes mellitus (HCC)   Type 2 diabetes mellitus with complication, without long-term current use of insulin (HCC)   UTI (urinary tract infection)   Hyperlipidemia   Dysphagia, post-stroke   Essential hypertension   Paroxysmal atrial fibrillation (HCC)   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  Patient has been minimally responsive, requiring dextrose infusion to maintain blood glucose as she is not eating or drinking.  She has worsening anemia nearing threshold that would require transfusion.  She continues to spike fevers despite empiric antibiotics started on admission. As of afternoon 11/21/2020, after goals of care discussion between MD and daughter, family has made the decision to transition patient to comfort care.   Please call with hospice related  questions or concerns.   Hospice Home will reach out to weekend [facility] to arrange transportation.  Cyndra Numbers, RN [facility] Liaison [phone]

## 2020-11-21 NOTE — Progress Notes (Signed)
Report called to Meredith Shaw on 1A

## 2020-11-21 NOTE — Progress Notes (Signed)
Attempted several times to give patient her evening medications. Patient is drowsy and unable to take po medications safely. NP made aware.

## 2020-11-21 NOTE — Progress Notes (Signed)
Patient's foley started leaking. NP notified and a purewick was placed.

## 2020-11-21 NOTE — Progress Notes (Addendum)
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 fib on Eliquis who presented to the ED on 11/17/2020 after having witnessed seizure-like activity at SNF followed by prolonged somnolence.  She had 2 additional episodes that were witnessed by family.    Of note, patient is currently on hospice for terminal diagnosis of cognitive decline secondary to her stroke.  Family do not want aggressive measures, prefer supportive care only.  ED patient was hypotensive with BP 78/50, low-grade temp of 90 9.19F, labs notable for leukocytosis 19 K hemoglobin 8.2, urinalysis questionable for UTI.  Admitted to hospitalist service with neurology consulted for further evaluation.  EEG was consistent with seizure activity.  Patient was started on Keppra for seizure prevention.  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  Is currently still receiving IV antibiotics for UTI and is on dextrose infusion for hypoglycemia.    Assessment & Plan   Principal Problem:   New onset seizure (HCC) Active Problems:   Postablative hypothyroidism   CKD stage 3 due to type 2 diabetes mellitus (HCC)   Type 2 diabetes mellitus with complication, without long-term current use of insulin (HCC)   UTI (urinary tract infection)   Hyperlipidemia   Dysphagia, post-stroke   Essential hypertension   Paroxysmal atrial fibrillation (HCC)   Left Hemiplegia as late effect of cerebrovascular accident (CVA) (HCC)   Sepsis (HCC)   Hypoglycemia due to insulin   Anemia of chronic disease   Comfort care status -as of afternoon 11/21/2020, after goals of care discussion between myself daughter  and the hospice liaison seared the hospital, family made decision to transition patient to comfort care.  Patient has been minimally responsive, requiring dextrose infusion to maintain blood glucose as she is not eating or drinking.  She has worsening anemia nearing threshold that would require transfusion.  She continues to spike fevers despite empiric antibiotics started on admission. Family hoping patient will be able to transfer to hospice home. --Comfort measures per orders --Notify provider if any signs of distress or discomfort   Assessment plan prior to comfort status  Fever -temp 101.8 (morning 2/9).  Rectal Tylenol ordered as patient somnolent and unsafe for p.o. Urine cultures were contaminated, nothing isolated.  Fevers could be of CNS etiology vs other infection. --Stop antibiotics given comfort care transition  Acute anemia - Hbg has slowly declined since admission, without evidence of bleeding.  Had normal hemoglobin in November 12.6, presented with hemoglobin 8.2 >> ...7.4.  Discussed basic evaluation for acute anemia with daughter, including GI evaluation/endoscopies.  They do not wish to pursue any evaluations and would not want blood transfusions given. --No further labs, no transfusions  Sepsis -present on admission as evidenced by hypotension, leukocytosis, UA consistent with infection.   Treated per protocol.  Add'l mgmt as below.  UTI -POA.  Initial urine culture grew multiple species concerning for contamination however patient febrile past 2 days as above.  Stop Rocephin given transition to comfort.  Hypoglycemia -continue D5W infusion and glucose monitoring.  This is due to little to no oral intake. Discussed with daughter regarding transition to comfort care.  We will continue D5 drip for right now  while other family members come to visit patient.    New onset seizure /post stroke epilepsy -MRI was negative for any new acute stroke.  EEG was consistent with  epileptogenic city for for arising from the right frontal region along with cortical dysfunction of the right hemisphere likely due to underlying stroke, postictal state, moderate diffuse encephalopathy which is nonspecific. --Neurology consulting, appreciate recommendations --Continue Keppra 500 mg IV twice daily for seizure prevention. --Ativan as needed for seizure activity  Insulin-dependent type 2 diabetes -takes Metformin and basal insulin at home.  Last A1c 6.6%. On dextrose infusion due to hypoglycemia as above. --No further CBGs given comfort transition  Severe protein calorie malnutrition - pt bedridden, with dysphagia, reduced alertness, inadequate PO intake since stroke and weight loss over 15% body wt prior 6 months, albumin 1.7. --Dietician consulted, SLP is following --Nutritional support per orders --Pt followed by hospice --Now comfort status   Cognitive decline -course has been fairly rapid since patient stroke.   --Pending B12, TSH, thiamine, RPR, HIV  History of right MCA stroke with left-sided hemiplegia and dysphagia-in July 2021.  MRI brain this admission did not show any new or more recent stroke from prior.  Neurology is following --Stopped Eliquis given worsening anemia and now comfort status  Hypothyroidism -stop levothyroxine  Essential hypertension -stop metoprolol  Hyperlipidemia -stop pravastatin  Paroxysmal A. Fib -rate currently controlled.  Stop Eliquis.  Off telemetry given comfort status  Hospice patient -patient is followed by hospice outpatient due to her rapid of cognitive and functional decline since her stroke.  Family prefer patient be transferred to [facility] and not [facility] for concerns of quality of care and attention patient will receive.    DVT prophylaxis: None, comfort care   Diet:  Diet Orders (From admission, onward)    Start     Ordered   11/19/20 1435  DIET DYS 2 Room service appropriate? Yes with Assist;  Fluid consistency: Thin  Diet effective now       Comments: Extra Gravy on meats, potatoes. Yogurt, Cream Soups, Puddings, hot cereals w/ sugar at meals.  Question Answer Comment  Room service appropriate? Yes with Assist   Fluid consistency: Thin      11/19/20 1437            Code Status: DNR    Subjective 11/21/20    Patient seen this AM with RN in room, sitting up awake.  Does not verbally respond or make any eye contact.  Seen with daughter at bedside this afternoon and discussed patient's overall status and multiple acute issues ongoing.  Daughter had also spoken with hospice liaison earlier.  Family have decided to transition to comfort care.  Disposition Plan & Communication   Status is: Inpatient  Remains inpatient appropriate because: pt being transitioned to comfort care today with plan to discharge to hospice home pending referral and acceptance.     Dispo: The patient is from: SNF              Anticipated d/c is to: Hospice home              Anticipated d/c date is: 2 days              Patient currently is not medically stable to d/c.   Difficult to place patient No   Family Communication: None at bedside, will attempt to call this afternoon   Consults, Procedures, Significant Events   Consultants:  Neurology  Procedures:   EEG  Antimicrobials:  Anti-infectives (From admission, onward)   Start     Dose/Rate Route Frequency Ordered Stop   11/17/20 2245  cefTRIAXone (ROCEPHIN) 1 g in sodium chloride 0.9 % 100 mL IVPB  Status:  Discontinued        1 g 200 mL/hr over 30 Minutes Intravenous Every 24 hours 11/17/20 2159 11/17/20 2242   11/17/20 2015  cefTRIAXone (ROCEPHIN) 1 g in sodium chloride 0.9 % 100 mL IVPB  Status:  Discontinued        1 g 200 mL/hr over 30 Minutes Intravenous Every 24 hours 11/17/20 2006 11/21/20 1323   11/17/20 1815  cefTRIAXone (ROCEPHIN) 2 g in sodium chloride 0.9 % 100 mL IVPB        2 g 200 mL/hr over 30 Minutes Intravenous   Once 11/17/20 1802 11/17/20 2050        Objective   Vitals:   11/21/20 0100 11/21/20 0432 11/21/20 0803 11/21/20 1152  BP:  111/69 (!) 124/95 (!) 88/67  Pulse:  98 (!) 102 65  Resp:  18 18 18   Temp:  (!) 101.1 F (38.4 C) 98.1 F (36.7 C) 98.9 F (37.2 C)  TempSrc:  Oral  Oral  SpO2:  93% 93% 90%  Weight: 70.3 kg     Height:        Intake/Output Summary (Last 24 hours) at 11/21/2020 1405 Last data filed at 11/21/2020 1346 Gross per 24 hour  Intake 181.9 ml  Output 600 ml  Net -418.1 ml   Filed Weights   11/19/20 0350 11/20/20 0557 11/21/20 0100  Weight: 73.9 kg 73.8 kg 70.3 kg    Physical Exam:  General exam: awake with eyes open, sitting up in bed, non-verbal, no interaction, no acute distress Respiratory system: CTAB, no wheezes, rales or rhonchi, normal respiratory effort. Cardiovascular system: normal S1/S2, RRR, no pedal edema.   Gastrointestinal system: soft, NT, ND, +bowel sounds. Central nervous system: exam limited by pt condition, does not follow commands or respond verbally, stable left hemiparesis Skin: Pale, warm distal extremities dry, intact   Labs   Data Reviewed: I have personally reviewed following labs and imaging studies  CBC: Recent Labs  Lab 11/17/20 1510 11/19/20 0523 11/20/20 0616 11/21/20 0437  WBC 19.3* 17.8* 14.6* 10.8*  NEUTROABS 17.3*  --   --   --   HGB 8.2* 7.9* 7.6* 7.4*  HCT 27.5* 25.7* 24.0* 24.3*  MCV 88.1 87.4 83.9 84.7  PLT 492* 414* 386 427*   Basic Metabolic Panel: Recent Labs  Lab 11/17/20 1510 11/19/20 0523 11/20/20 0616 11/21/20 0437  NA 142 139 138 140  K 3.6 3.6 3.4* 3.4*  CL 107 106 108 109  CO2 25 21* 20* 24  GLUCOSE 89 185* 122* 132*  BUN 38* 20 18 16   CREATININE 1.07* 0.74 0.71 0.81  CALCIUM 9.0 8.5* 8.4* 8.5*  MG  --   --   --  1.2*   GFR: Estimated Creatinine Clearance: 58.6 mL/min (by C-G formula based on SCr of 0.81 mg/dL). Liver Function Tests: Recent Labs  Lab 11/17/20 1510  AST 14*   ALT 7  ALKPHOS 89  BILITOT 0.4  PROT 5.9*  ALBUMIN 1.7*   Recent Labs  Lab 11/17/20 1510  LIPASE 22   No results for input(s): AMMONIA in the last 168 hours. Coagulation Profile: No results for input(s): INR, PROTIME in the last 168 hours. Cardiac Enzymes: No results for input(s): CKTOTAL, CKMB, CKMBINDEX,  TROPONINI in the last 168 hours. BNP (last 3 results) No results for input(s): PROBNP in the last 8760 hours. HbA1C: No results for input(s): HGBA1C in the last 72 hours. CBG: Recent Labs  Lab 11/20/20 2138 11/21/20 0001 11/21/20 0429 11/21/20 0804 11/21/20 1154  GLUCAP 128* 129* 128* 121* 193*   Lipid Profile: No results for input(s): CHOL, HDL, LDLCALC, TRIG, CHOLHDL, LDLDIRECT in the last 72 hours. Thyroid Function Tests: Recent Labs    11/19/20 0523  TSH 0.446   Anemia Panel: Recent Labs    11/19/20 0523  VITAMINB12 333  TIBC NOT CALCULATED  IRON 9*   Sepsis Labs: No results for input(s): PROCALCITON, LATICACIDVEN in the last 168 hours.  Recent Results (from the past 240 hour(s))  Urine culture     Status: Abnormal   Collection Time: 11/17/20  2:53 PM   Specimen: Urine, Random  Result Value Ref Range Status   Specimen Description   Final    URINE, RANDOM Performed at [facility], [address] [place], [place] [address]    Special Requests   Final    NONE Performed at [facility], [address] [place], [place] [address]    Culture MULTIPLE SPECIES PRESENT, SUGGEST RECOLLECTION (A)  Final   Report Status 11/19/2020 FINAL  Final  Blood culture (single)     Status: None (Preliminary result)   Collection Time: 11/17/20  6:39 PM   Specimen: BLOOD  Result Value Ref Range Status   Specimen Description BLOOD RIGHT ANTECUBITAL  Final   Special Requests   Final    BOTTLES DRAWN AEROBIC AND ANAEROBIC Blood Culture adequate volume   Culture   Final    NO GROWTH 4 DAYS Performed at [facility], [address] [place], [place] [address]    Report Status PENDING  Incomplete  SARS CORONAVIRUS 2 (TAT 6-24 HRS) Nasopharyngeal Nasopharyngeal Swab     Status: None   Collection Time: 11/17/20  6:50 PM   Specimen: Nasopharyngeal Swab  Result Value Ref Range Status   SARS Coronavirus 2 NEGATIVE NEGATIVE Final    Comment: (NOTE) SARS-CoV-2 target nucleic acids are NOT DETECTED.  The SARS-CoV-2 RNA is generally detectable in upper and lower respiratory specimens during the acute phase of infection. Negative results do not preclude SARS-CoV-2 infection, do not rule out co-infections with other pathogens, and should not be used as the sole basis for treatment or other patient management decisions. Negative results must be combined with clinical observations, patient history, and epidemiological information. The expected result is Negative.  Fact Sheet for Patients: HairSlick.nohttps://www.fda.gov/media/138098/download  Fact Sheet for Healthcare Providers: quierodirigir.comhttps://www.fda.gov/media/138095/download  This test is not yet approved or cleared by the [place] FDA and  has been authorized for detection and/or diagnosis of SARS-CoV-2 by FDA under an Emergency Use Authorization (EUA). This EUA will remain  in effect (meaning this test can be used) for the duration of the COVID-19 declaration under Se ction 564(b)(1) of the Act, 21 U.S.C. section 360bbb-3(b)(1), unless the authorization is terminated or revoked sooner.  Performed at [facility], [address] [place], [place] [address]   Urine Culture     Status: Abnormal   Collection Time: 11/19/20  5:22 PM   Specimen: Urine, Random  Result Value Ref Range Status   Specimen Description   Final    URINE, RANDOM Performed at [facility], [address] [place], [place] [address]    Special Requests   Final  NONE Performed at [facility], [address] [place], [place] [address]    Culture MULTIPLE SPECIES PRESENT,  SUGGEST RECOLLECTION (A)  Final   Report Status 11/21/2020 FINAL  Final      Imaging Studies   No results found.   Medications   Scheduled Meds: . Chlorhexidine Gluconate Cloth  6 each Topical Daily  . metoprolol tartrate  25 mg Oral BID   Continuous Infusions: . dextrose 25 mL/hr at 11/20/20 1430  . levETIRAcetam 500 mg (11/21/20 0822)       LOS: 4 days    Time spent: 45 minutes with greater than 50% spent at bedside and in coordination of care.    Pennie Banter, DO [facility]  11/21/2020, 2:05 PM      If 7PM-7AM, please contact night-coverage. How to contact the [facility] Attending or Consulting provider 7A - 7P or covering provider during after hours 7P -7A, for this patient?    1. Check the care team in [facility] and look for a) attending/consulting [facility] provider listed and b) the [facility] team listed 2. Log into [email or web address] and use [facility] universal password to access. If you do not have the password, please contact the hospital operator. 3. Locate the [facility] provider you are looking for under [facility] and page to a number that you can be directly reached. 4. If you still have difficulty reaching the provider, please page the [facility] (Director on Call) for the Hospitalists listed on amion for assistance.

## 2020-11-22 DIAGNOSIS — Z515 Encounter for palliative care: Secondary | ICD-10-CM

## 2020-11-22 LAB — CULTURE, BLOOD (SINGLE)
Culture: NO GROWTH
Special Requests: ADEQUATE

## 2020-11-22 MED ORDER — LORAZEPAM 2 MG/ML IJ SOLN
2.0000 mg | Freq: Once | INTRAMUSCULAR | 0 refills | Status: AC | PRN
Start: 2020-11-22 — End: ?

## 2020-11-22 MED ORDER — LORAZEPAM 0.5 MG PO TABS: 0.5000 mg | ORAL_TABLET | Freq: Two times a day (BID) | ORAL | 0 refills | Status: AC

## 2020-11-22 MED ORDER — MORPHINE SULFATE (CONCENTRATE) 10 MG /0.5 ML PO SOLN: 10.0000 mg | ORAL | 0 refills | Status: AC | PRN

## 2020-11-22 MED ORDER — SENNOSIDES-DOCUSATE SODIUM 8.6-50 MG PO TABS: 1.0000 | ORAL_TABLET | Freq: Every evening | ORAL | Status: AC | PRN

## 2020-11-22 MED ORDER — ONDANSETRON HCL 4 MG/2ML IJ SOLN: 4.0000 mg | Freq: Four times a day (QID) | INTRAMUSCULAR | 0 refills | Status: AC | PRN

## 2020-11-22 MED ORDER — LORAZEPAM 2 MG/ML IJ SOLN: 0.5000 mg | INTRAMUSCULAR | 0 refills | Status: AC | PRN

## 2020-11-22 MED ORDER — ACETAMINOPHEN 650 MG RE SUPP: 650.0000 mg | RECTAL | 0 refills | Status: AC | PRN

## 2020-11-22 MED ORDER — ONDANSETRON HCL 4 MG PO TABS: 4.0000 mg | ORAL_TABLET | Freq: Four times a day (QID) | ORAL | 0 refills | Status: AC | PRN

## 2020-11-22 NOTE — Discharge Summary (Signed)
Physician Discharge Summary  KAYDON CREEDON ZOX:[id number] DOB: 21-Feb-1946 DOA: 11/17/2020  PCP: Rosetta Posner, MD  Admit date: 11/17/2020 Discharge date: 11/22/2020  Admitted From: SNF Disposition:  Hospice home  Recommendations for Outpatient Follow-up: n/a   Home Health: n/a  Equipment/Devices: n/a   Discharge Condition: stable CODE STATUS: DNR  Diet recommendation: Dysphagia-2 with thin liquids - okay to feed per patient comfort.    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 fib on Eliquis who presented to the ED on 11/17/2020 after having witnessed seizure-like activity at SNF followed by prolonged somnolence.  She had 2 additional episodes that were witnessed by family.    Of note, patient is currently on hospice for terminal diagnosis of cognitive decline secondary to her stroke.  Family do not want aggressive measures, prefer supportive care only.  ED patient was hypotensive with BP 78/50, low-grade temp of 90 9.55F, labs notable for leukocytosis 19 K hemoglobin 8.2, urinalysis questionable for UTI.  Admitted to hospitalist service with neurology consulted for further evaluation.  EEG was consistent with seizure activity.   Patient was started on Keppra for seizure prevention.  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 Patient has been minimally responsive, requiring dextrose infusion to maintain blood glucose as she is not eating or drinking.  She has worsening anemia nearing threshold that would require transfusion.  She continues to spike fevers despite antibiotics. Patient is stable for discharge to Hospice home today. Family hoping patient will be able to transfer to hospice home. --Comfort medications per orders --Notify provider if any signs of distress or discomfort   Assessment plan prior to comfort status  Fever -temp 101.8 (morning 2/9).  Rectal Tylenol ordered as patient somnolent and unsafe for p.o. Urine cultures were contaminated, nothing isolated.   Fevers could be of CNS etiology vs other infection. Antibiotics were stopped.  Acute anemia - Hbg has slowly declined since admission, without evidence of bleeding.  Had normal hemoglobin in November 12.6, presented with hemoglobin 8.2 >> ...7.4.  Discussed basic evaluation for acute anemia with daughter, including GI evaluation/endoscopies.  They do not wish to pursue any evaluations and would not want blood transfusions given. --No further labs, no transfusions  Sepsis -present on admission as evidenced by hypotension, leukocytosis, UA consistent with infection.  Treated per protocol.    UTI -POA.  Initial urine culture grew multiple species concerning for contamination however patient febrile past 2 days as above. Stop Rocephin given transition to comfort.  Hypoglycemia - continue D5W  infusion until discharge to hospice. This is due to little to no oral intake.    New onset seizure  /post stroke epilepsy -MRI was negative for any new acute stroke.  EEG was consistent with epileptogenic city for for arising from the right frontal region along with cortical dysfunction of the right hemisphere likely due to underlying stroke, postictal state, moderate diffuse encephalopathy which is nonspecific. --Neurology consulted and pt started on Keppra 500 mg IV twice daily since not taking PO --Will use scheduled Ativan for seizure prevention at hospice --Additional Ativan PRN for seizure activity or anxiety/agitation/comfort  Insulin-dependent type 2 diabetes -was taking Metformin and basal insulin at home.  Last A1c 6.6%.  Requried dextrose infusion due to hypoglycemia as above.  No further CBGs given comfort transition.  Severe protein calorie malnutrition - pt bedridden, with dysphagia, reduced alertness, inadequate PO intake since stroke and weight loss over 15% body wt prior 6 months, albumin 1.7. --Dietician consulted, SLP is following --Nutritional support per orders --Pt followed by hospice --Now comfort status   Cognitive decline -course has been fairly rapid since patient stroke.   --Pending B12, TSH, thiamine, RPR, HIV  History of right MCA stroke with left-sided hemiplegia and dysphagia-in July 2021.  MRI brain this admission did not show any new or more recent stroke from prior.  Neurology is following --Stopped Eliquis given worsening anemia and now comfort status  Hypothyroidism -stop levothyroxine  Essential hypertension -stop metoprolol  Hyperlipidemia -stop pravastatin  Paroxysmal A. Fib -rate currently controlled.  Stop Eliquis.  Off telemetry given comfort status  Hospice patient -patient is followed by hospice outpatient due to her rapid of cognitive and functional decline since her stroke.  Family prefer patient be transferred to [facility] hospice home.     Discharge Instructions   Discharge Instructions    Call MD for:  severe uncontrolled  pain   Complete by: As directed    Diet - low sodium heart healthy   Complete by: As directed    Discharge instructions   Complete by: As directed    Notify provider if current medication regimen not sufficient for patient's comfort.   Increase activity slowly   Complete by: As directed      Allergies as of 11/22/2020      Reactions   Semaglutide Nausea And Vomiting   PO Rybelsus   Farxiga [dapagliflozin]    Yeast infections   Penicillins Itching, Rash   Has tolerated Cefazlin and Ceftriaxone in past       Medication List    STOP taking these medications   apixaban 5 MG Tabs tablet Commonly known as: Eliquis   ascorbic acid 500 MG tablet Commonly known as: VITAMIN C   aspirin 325 MG tablet   cefTRIAXone 1 g injection Commonly known as: ROCEPHIN   ezetimibe 10 MG tablet Commonly known as: ZETIA   gabapentin 100 MG capsule Commonly known as: NEURONTIN   ibandronate 3 MG/3ML Soln injection Commonly known as: BONIVA   insulin aspart 100 UNIT/ML injection Commonly known as: novoLOG   insulin glargine 100 UNIT/ML injection Commonly known as: LANTUS   levothyroxine 175 MCG tablet Commonly known as: SYNTHROID   LORazepam 1 MG tablet Commonly known as: ATIVAN Replaced by: LORazepam 2 MG/ML injection   metFORMIN 500 MG tablet Commonly known as: GLUCOPHAGE   metoprolol tartrate 25 MG tablet Commonly known as: LOPRESSOR   multivitamin with minerals Tabs tablet   polyethylene glycol 17 g packet Commonly known as: MIRALAX /  GLYCOLAX   pravastatin 40 MG tablet Commonly known as: PRAVACHOL   Pro-Stat Liqd     TAKE these medications   acetaminophen 325 MG tablet Commonly known as: TYLENOL Take 2 tablets (650 mg total) by mouth every 4 (four) hours as needed for mild pain (or temp > 37.5 C (99.5 F)). What changed: Another medication with the same name was added. Make sure you understand how and when to take each.   acetaminophen 650 MG  suppository Commonly known as: TYLENOL Place 1 suppository (650 mg total) rectally every 4 (four) hours as needed for fever. What changed: You were already taking a medication with the same name, and this prescription was added. Make sure you understand how and when to take each.   bisacodyl 5 MG EC tablet Commonly known as: DULCOLAX Take 5 mg by mouth daily as needed for moderate constipation.   LORazepam 2 MG/ML injection Commonly known as: ATIVAN Inject 0.25 mLs (0.5 mg total) into the vein every 4 (four) hours as needed for anxiety (agitation). Replaces: LORazepam 1 MG tablet   LORazepam 2 MG/ML injection Commonly known as: ATIVAN Inject 1 mL (2 mg total) into the vein once as needed for up to 2 doses for seizure (may repeat 5-10 minutes if seizure continues).   LORazepam 0.5 MG tablet Commonly known as: Ativan Take 1 tablet (0.5 mg total) by mouth 2 (two) times daily.   morphine CONCENTRATE 10 mg / 0.5 ml concentrated solution Take 0.5 mLs (10 mg total) by mouth every 2 (two) hours as needed for severe pain or shortness of breath.   ondansetron 4 MG tablet Commonly known as: ZOFRAN Take 1 tablet (4 mg total) by mouth every 6 (six) hours as needed for nausea or vomiting.   ondansetron 4 MG/2ML Soln injection Commonly known as: ZOFRAN Inject 2 mLs (4 mg total) into the vein every 6 (six) hours as needed for nausea or vomiting.   pantoprazole 40 MG tablet Commonly known as: PROTONIX Take 1 tablet (40 mg total) by mouth daily.   senna-docusate 8.6-50 MG tablet Commonly known as: Senokot-S Take 1 tablet by mouth at bedtime as needed for mild constipation.   tiZANidine 2 MG tablet Commonly known as: ZANAFLEX Take 0.5 tablets (1 mg total) by mouth 2 (two) times daily.       Allergies  Allergen Reactions  . Semaglutide Nausea And Vomiting    PO Rybelsus  . Farxiga [Dapagliflozin]     Yeast infections  . Penicillins Itching and Rash    Has tolerated Cefazlin and  Ceftriaxone in past      If you experience worsening of your admission symptoms, develop shortness of breath, life threatening emergency, suicidal or homicidal thoughts you must seek medical attention immediately by calling 911 or calling your MD immediately  if symptoms less severe.    Please note   You were cared for by a hospitalist during your hospital stay. If you have any questions about your discharge medications or the care you received while you were in the hospital after you are discharged, you can call the unit and asked to speak with the hospitalist on call if the hospitalist that took care of you is not available. Once you are discharged, your primary care physician will handle any further medical issues. Please note that NO REFILLS for any discharge medications will be authorized once you are discharged, as it is imperative that you return to your primary care physician (or establish a relationship with  a primary care physician if you do not have one) for your aftercare needs so that they can reassess your need for medications and monitor your lab values.   Consultations:  Neurology    Procedures/Studies: EEG  Result Date: 11/18/2020 Charlsie Quest, MD     11/18/2020  3:47 PM Patient Name: ZENA VITELLI MRN: [id number] Epilepsy Attending: Charlsie Quest Referring Physician/Provider: Dr Lindajo Royal Date: 11/18/2020 Duration: 24.12 mins Patient history: 75yo F with seizure like activity. EEG to evaluate for seizure. Level of alertness: Awake AEDs during EEG study: None Technical aspects: This EEG study was done with scalp electrodes positioned according to the 10-20 International system of electrode placement. Electrical activity was acquired at a sampling rate of  and reviewed with a high frequency filter of  and a low frequency filter of . EEG data were recorded continuously and digitally stored. Description: No clear posterior dominant rhythm was seen. EEG showed  continuous/ generalized and lateralized right hemispheric 3 to 6 Hz theta-delta slowing. Sharp wave were seen in right frontal region.   Physiologic photic driving was not seen during photic stimulation.  Hyperventilation was not performed.   ABNORMALITY -Continuous slow, generalized and lateralized right hemisphere - Sharp wave, right frontal region IMPRESSION: This studyshowed evidence of potential epileptogenicity arising from right frontal region. There is also cortical dysfunction in right hemisphere likely due to underlying stroke, post-ictal state. Additionally there is moderate diffuse encephalopathy, nonspecific etiology. No seizures were seen throughout the recording. Charlsie Quest   CT Head Wo Contrast  Result Date: 11/17/2020 CLINICAL DATA:  75 year old female with altered mental status. EXAM: CT HEAD WITHOUT CONTRAST TECHNIQUE: Contiguous axial images were obtained from the base of the skull through the vertex without intravenous contrast. COMPARISON:  Head CT dated 03/24/2020. FINDINGS: Brain: There is a large area of old infarct and encephalomalacia involving the right MCA territory. There is associated mild ex vacuo dilatation of the right lateral ventricle. There is no acute intracranial hemorrhage. No mass effect or midline shift. No extra-axial fluid collection. Vascular: No hyperdense vessel or unexpected calcification. Skull: Normal. Negative for fracture or focal lesion. Sinuses/Orbits: No acute finding. Other: None IMPRESSION: 1. No acute intracranial pathology. 2. 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 Electronically Signed   By: Elgie Collard M.D.   On: 11/17/2020 15:39   MR BRAIN WO CONTRAST  Result Date: 11/18/2020 CLINICAL DATA:  75 year old female with altered mental status. Seizure like activity. Previous right MCA infarct. Atrial fibrillation on Eliquis. EXAM: MRI HEAD WITHOUT CONTRAST TECHNIQUE: Multiplanar, multiecho pulse sequences of the brain and  surrounding structures were obtained without intravenous contrast. COMPARISON:  Head CT 11/17/2020.  Brain MRI 03/25/2020. FINDINGS: Brain: Expected evolution of the relatively large right MCA territory infarct from June of last year. Encephalomalacia with areas of laminar necrosis and hemosiderin now. Ex vacuo enlargement of the right lateral ventricle. Associated Wallerian degeneration. No restricted diffusion to suggest acute infarction. No midline shift, mass effect, evidence of mass lesion, ventriculomegaly, extra-axial collection or acute intracranial hemorrhage. Cervicomedullary junction and pituitary are within normal limits. Outside of the right MCA territory patchy and confluent white matter T2 and FLAIR hyperintensity has mildly progressed from last year, including a small area of possible cortical encephalomalacia in the left anterior frontal lobe on series 12, image 18. Chronic lacunar infarcts of the left thalamus are stable. Small chronic infarcts in the right cerebellum have not definitely progressed. No chronic hemorrhage outside of the right MCA  territory. Thin coronal imaging does not suggest mesial temporal sclerosis. Vascular: Major intracranial vascular flow voids appear stable since last year. Skull and upper cervical spine: Stable visible cervical spine degeneration. Normal bone marrow signal. Sinuses/Orbits: Stable, negative. Other: Visible internal auditory structures appear normal. Mastoids remain clear. IMPRESSION: 1. No acute intracranial abnormality. 2. Expected evolution of the large right MCA infarct from June of last year. Associated encephalomalacia, laminar necrosis, hemosiderin. Mild progression of small vessel infarcts elsewhere since that time. Electronically Signed   By: Odessa Fleming M.D.   On: 11/18/2020 08:59   DG Chest Portable 1 View  Result Date: 11/17/2020 CLINICAL DATA:  Weakness, history of CVA in June gradual decline, reported seizure-like activity EXAM: PORTABLE CHEST 1  VIEW COMPARISON:  Radiograph 04/05/2020 FINDINGS: Coarsened interstitial and bronchitic features are similar to comparison is. There is some mild pulmonary vascular congestion without other convincing features of edema. No pneumothorax. No visible effusion. The aorta is calcified. Loop recorder projects over the left heart. Cardiac size is grossly normal for portable technique. The remaining cardiomediastinal contours are unremarkable. No acute osseous or soft tissue abnormality. Degenerative changes are present in the imaged spine and shoulders. Telemetry leads overlie the chest. IMPRESSION: Mild pulmonary vascular congestion without other convincing features of edema. More chronically coarsened interstitial and bronchitic features are similar to priors. Electronically Signed   By: Kreg Shropshire M.D.   On: 11/17/2020 15:30   CUP PACEART REMOTE DEVICE CHECK  Result Date: 10/29/2020 ILR summary report received. Battery status OK. Normal device function. No new symptom, tachy, brady, or pause episodes. No new AF episodes. Monthly summary reports and ROV/PRN. HB      Subjective: Pt sleeping comfortably, responded to voice, opened left eye.  She denies pain or discomfort.  Glad to hear getting out of hospital today.  No acute events reported.  No family at bedside during encounter.   Discharge Exam: Vitals:   11/21/20 2312 11/22/20 0753  BP: 117/63 105/66  Pulse: 82 88  Resp: 14 17  Temp: 99.3 F (37.4 C) 98.8 F (37.1 C)  SpO2: 99% 98%   Vitals:   11/21/20 0803 11/21/20 1152 11/21/20 2312 11/22/20 0753  BP: (!) 124/95 (!) 88/67 117/63 105/66  Pulse: (!) 102 65 82 88  Resp: 18 18 14 17   Temp: 98.1 F (36.7 C) 98.9 F (37.2 C) 99.3 F (37.4 C) 98.8 F (37.1 C)  TempSrc:  Oral Oral Oral  SpO2: 93% 90% 99% 98%  Weight:      Height:        General: Pt is alert, awake, not in acute distress Cardiovascular: RRR, S1/S2 +, no rubs, no gallops Respiratory: CTA bilaterally, no wheezing, no  rhonchi Abdominal: Soft, NT, ND, bowel sounds + Extremities: right-sided hemiparesis, LUE edema, no cyanosis Skin: dry, intact, normal temp    The results of significant diagnostics from this hospitalization (including imaging, microbiology, ancillary and laboratory) are listed below for reference.     Microbiology: Recent Results (from the past 240 hour(s))  Urine culture     Status: Abnormal   Collection Time: 11/17/20  2:53 PM   Specimen: Urine, Random  Result Value Ref Range Status   Specimen Description   Final    URINE, RANDOM Performed at [facility], [address], [place], [place]    Special Requests   Final    NONE Performed at [facility], [address], [place], [place]    Culture MULTIPLE  SPECIES PRESENT, SUGGEST RECOLLECTION (A)  Final   Report Status 11/19/2020 FINAL  Final  Blood culture (single)     Status: None   Collection Time: 11/17/20  6:39 PM   Specimen: BLOOD  Result Value Ref Range Status   Specimen Description BLOOD RIGHT ANTECUBITAL  Final   Special Requests   Final    BOTTLES DRAWN AEROBIC AND ANAEROBIC Blood Culture adequate volume   Culture   Final    NO GROWTH 5 DAYS Performed at [facility], [address] [place], [place] [address]    Report Status 11/22/2020 FINAL  Final  SARS CORONAVIRUS 2 (TAT 6-24 HRS) Nasopharyngeal Nasopharyngeal Swab     Status: None   Collection Time: 11/17/20  6:50 PM   Specimen: Nasopharyngeal Swab  Result Value Ref Range Status   SARS Coronavirus 2 NEGATIVE NEGATIVE Final    Comment: (NOTE) SARS-CoV-2 target nucleic acids are NOT DETECTED.  The SARS-CoV-2 RNA is generally detectable in upper and lower respiratory specimens during the acute phase of infection. Negative results do not preclude SARS-CoV-2 infection, do not rule out co-infections with other pathogens, and should not be used as the sole basis for treatment or other patient management  decisions. Negative results must be combined with clinical observations, patient history, and epidemiological information. The expected result is Negative.  Fact Sheet for Patients: HairSlick.no  Fact Sheet for Healthcare Providers: quierodirigir.com  This test is not yet approved or cleared by the [place] FDA and  has been authorized for detection and/or diagnosis of SARS-CoV-2 by FDA under an Emergency Use Authorization (EUA). This EUA will remain  in effect (meaning this test can be used) for the duration of the COVID-19 declaration under Se ction 564(b)(1) of the Act, 21 U.S.C. section 360bbb-3(b)(1), unless the authorization is terminated or revoked sooner.  Performed at [facility], [address] [place], [place] [address]   Urine Culture     Status: Abnormal   Collection Time: 11/19/20  5:22 PM   Specimen: Urine, Random  Result Value Ref Range Status   Specimen Description   Final    URINE, RANDOM Performed at [facility], [address] [place], [place] [address]    Special Requests   Final    NONE Performed at [facility], [address] [place], [place] [address]    Culture MULTIPLE SPECIES PRESENT, SUGGEST RECOLLECTION (A)  Final   Report Status 11/21/2020 FINAL  Final     Labs: BNP (last 3 results) No results for input(s): BNP in the last 8760 hours. Basic Metabolic Panel: Recent Labs  Lab 11/17/20 1510 11/19/20 0523 11/20/20 0616 11/21/20 0437  NA 142 139 138 140  K 3.6 3.6 3.4* 3.4*  CL 107 106 108 109  CO2 25 21* 20* 24  GLUCOSE 89 185* 122* 132*  BUN 38* CREATININE 1.07* 0.74 0.71 0.81  CALCIUM 9.0 8.5* 8.4* 8.5*  MG  --   --   --  1.2*   Liver Function Tests: Recent Labs  Lab 11/17/20 1510  AST 14*  ALT 7  ALKPHOS 89  BILITOT 0.4  PROT 5.9*  ALBUMIN 1.7*   Recent Labs  Lab 11/17/20 1510  LIPASE 22   No results for input(s):  AMMONIA in the last 168 hours. CBC: Recent Labs  Lab 11/17/20 1510 11/19/20 0523 11/20/20 0616 11/21/20 0437  WBC 19.3* 17.8* 14.6* 10.8*  NEUTROABS 17.3*  --   --   --  HGB 8.2* 7.9* 7.6* 7.4*  HCT 27.5* 25.7* 24.0* 24.3*  MCV 88.1 87.4 83.9 84.7  PLT 492* 414* 386 427*   Cardiac Enzymes: No results for input(s): CKTOTAL, CKMB, CKMBINDEX, TROPONINI in the last 168 hours. BNP: Invalid input(s): POCBNP CBG: Recent Labs  Lab 11/20/20 2138 11/21/20 0001 11/21/20 0429 11/21/20 0804 11/21/20 1154  GLUCAP 128* 129* 128* 121* 193*   D-Dimer No results for input(s): DDIMER in the last 72 hours. Hgb A1c No results for input(s): HGBA1C in the last 72 hours. Lipid Profile No results for input(s): CHOL, HDL, LDLCALC, TRIG, CHOLHDL, LDLDIRECT in the last 72 hours. Thyroid function studies No results for input(s): TSH, T4TOTAL, T3FREE, THYROIDAB in the last 72 hours.  Invalid input(s): FREET3 Anemia work up No results for input(s): VITAMINB12, FOLATE, FERRITIN, TIBC, IRON, RETICCTPCT in the last 72 hours. Urinalysis    Component Value Date/Time   COLORURINE YELLOW (A) 11/17/2020 1453   APPEARANCEUR CLOUDY (A) 11/17/2020 1453   APPEARANCEUR Clear 12/31/2017 0829   LABSPEC 1.020 11/17/2020 1453   PHURINE 5.0 11/17/2020 1453   GLUCOSEU NEGATIVE 11/17/2020 1453   HGBUR SMALL (A) 11/17/2020 1453   BILIRUBINUR NEGATIVE 11/17/2020 1453   BILIRUBINUR Negative 12/31/2017 0829   KETONESUR NEGATIVE 11/17/2020 1453   PROTEINUR 30 (A) 11/17/2020 1453   UROBILINOGEN 0.2 04/02/2014 1424   NITRITE NEGATIVE 11/17/2020 1453   LEUKOCYTESUR LARGE (A) 11/17/2020 1453   Sepsis Labs Invalid input(s): PROCALCITONIN,  WBC,  LACTICIDVEN Microbiology Recent Results (from the past 240 hour(s))  Urine culture     Status: Abnormal   Collection Time: 11/17/20  2:53 PM   Specimen: Urine, Random  Result Value Ref Range Status   Specimen Description   Final    URINE, RANDOM Performed at  [facility], [address] [place], [place] [address]    Special Requests   Final    NONE Performed at [facility], [address] [place], [place] [address]    Culture MULTIPLE SPECIES PRESENT, SUGGEST RECOLLECTION (A)  Final   Report Status 11/19/2020 FINAL  Final  Blood culture (single)     Status: None   Collection Time: 11/17/20  6:39 PM   Specimen: BLOOD  Result Value Ref Range Status   Specimen Description BLOOD RIGHT ANTECUBITAL  Final   Special Requests   Final    BOTTLES DRAWN AEROBIC AND ANAEROBIC Blood Culture adequate volume   Culture   Final    NO GROWTH 5 DAYS Performed at [facility], [address], [place], [place] [address]    Report Status 11/22/2020 FINAL  Final  SARS CORONAVIRUS 2 (TAT 6-24 HRS) Nasopharyngeal Nasopharyngeal Swab     Status: None   Collection Time: 11/17/20  6:50 PM   Specimen: Nasopharyngeal Swab  Result Value Ref Range Status   SARS Coronavirus 2 NEGATIVE NEGATIVE Final    Comment: (NOTE) SARS-CoV-2 target nucleic acids are NOT DETECTED.  The SARS-CoV-2 RNA is generally detectable in upper and lower respiratory specimens during the acute phase of infection. Negative results do not preclude SARS-CoV-2 infection, do not rule out co-infections with other pathogens, and should not be used as the sole basis for treatment or other patient management decisions. Negative results must be combined with clinical observations, patient history, and epidemiological information. The expected result is Negative.  Fact Sheet for Patients: HairSlick.no  Fact Sheet for Healthcare Providers: quierodirigir.com  This test is not yet approved or cleared by the [place] FDA and  has been authorized for detection and/or diagnosis of SARS-CoV-2 by FDA under an Emergency Use Authorization (EUA). This EUA will remain  in effect (meaning this test can be used) for  the duration of the COVID-19 declaration under Se ction 564(b)(1) of the Act, 21 U.S.C. section 360bbb-3(b)(1), unless the authorization is terminated or revoked sooner.  Performed at [facility], [address] [place], [place] [address]   Urine Culture     Status: Abnormal   Collection Time: 11/19/20  5:22 PM   Specimen: Urine, Random  Result Value Ref Range Status   Specimen Description   Final    URINE, RANDOM Performed at [facility], [address] [place], [place] [address]    Special Requests   Final    NONE Performed at [facility], [address] [place], [place] [address]    Culture MULTIPLE SPECIES PRESENT, SUGGEST RECOLLECTION (A)  Final   Report Status 11/21/2020 FINAL  Final     Time coordinating discharge: Over 30 minutes  SIGNED:   Pennie BanterKelly A Oyinkansola Truax, DO [facility] 11/22/2020, 9:32 AM   If 7PM-7AM, please contact night-coverage [email or web address]

## 2020-11-22 NOTE — Progress Notes (Signed)
This RN provided report to Inetta Fermo, nurse assuming care of the patient at the hospice home. All outstanding questions resolved.

## 2020-11-22 NOTE — Progress Notes (Signed)
This RN attempted to call report to [facility]. No answer at this time. Voicemail left with callback information.

## 2020-11-27 ENCOUNTER — Ambulatory Visit (INDEPENDENT_AMBULATORY_CARE_PROVIDER_SITE_OTHER): Payer: Medicare Other

## 2020-11-27 DIAGNOSIS — I63511 Cerebral infarction due to unspecified occlusion or stenosis of right middle cerebral artery: Secondary | ICD-10-CM

## 2020-11-28 LAB — VITAMIN B1: Vitamin B1 (Thiamine): 120.6 nmol/L (ref 66.5–200.0)

## 2020-11-29 LAB — CUP PACEART REMOTE DEVICE CHECK
Date Time Interrogation Session: 20220218135436
Implantable Pulse Generator Implant Date: 20210802

## 2020-12-04 ENCOUNTER — Telehealth: Payer: Self-pay | Admitting: Adult Health

## 2020-12-04 NOTE — Telephone Encounter (Signed)
Sympathy card to family.

## 2020-12-04 NOTE — Telephone Encounter (Signed)
Pt's daughter called wanting to cx her mother's appt and to inform us that the pt has passed.

## 2020-12-09 NOTE — Progress Notes (Signed)
Carelink Summary Report / Loop Recorder 

## 2020-12-09 DEATH — deceased

## 2020-12-24 ENCOUNTER — Ambulatory Visit: Payer: Medicare Other | Admitting: Adult Health

## 2021-04-06 ENCOUNTER — Ambulatory Visit: Payer: Self-pay | Admitting: Physician Assistant

## 2021-04-14 IMAGING — DX DG CHEST 1V PORT
1 series · 1 of 1 positions shown · non-contrast
Comparison: 03/25/2020

CLINICAL DATA: Code stroke

EXAM:
PORTABLE CHEST 1 VIEW

[chest]
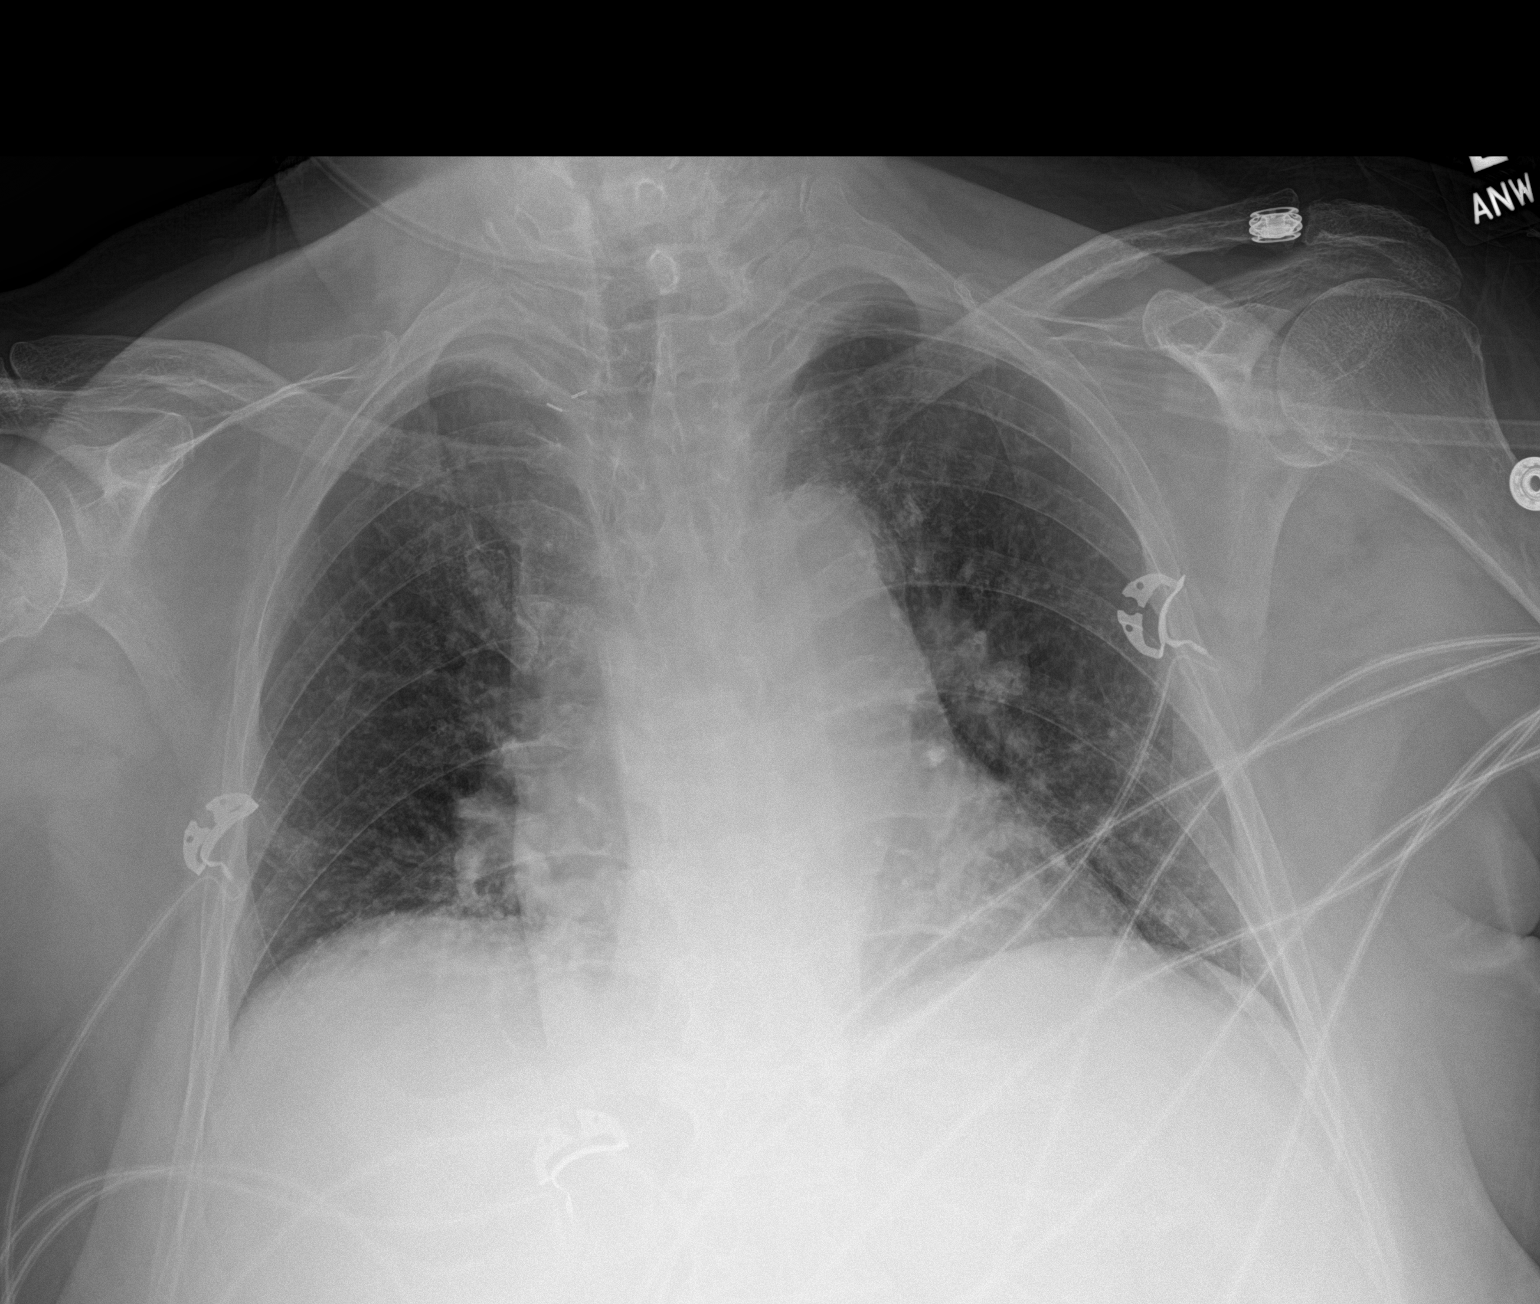

[1 of 1 positions shown; findings below may reference images not displayed]

FINDINGS: Cardiomediastinal contours and hilar structures are stable with
central pulmonary vascular engorgement, heart size accentuated by
low volumes and portable technique. No consolidation. No pleural
effusion.

Suspect subsegmental RIGHT lower lobe atelectasis as before.

Visualized skeletal structures are unremarkable.
IMPRESSION: No acute cardiopulmonary disease. Mild RIGHT basilar atelectasis
similar to prior studies.

## 2021-04-24 IMAGING — DX DG CHEST 1V PORT
1 series · 1 of 1 positions shown · non-contrast
Comparison: 03/26/2020

CLINICAL DATA: Leukocytosis.  History of hypertension and diabetes.

EXAM:
PORTABLE CHEST 1 VIEW

[chest ap]
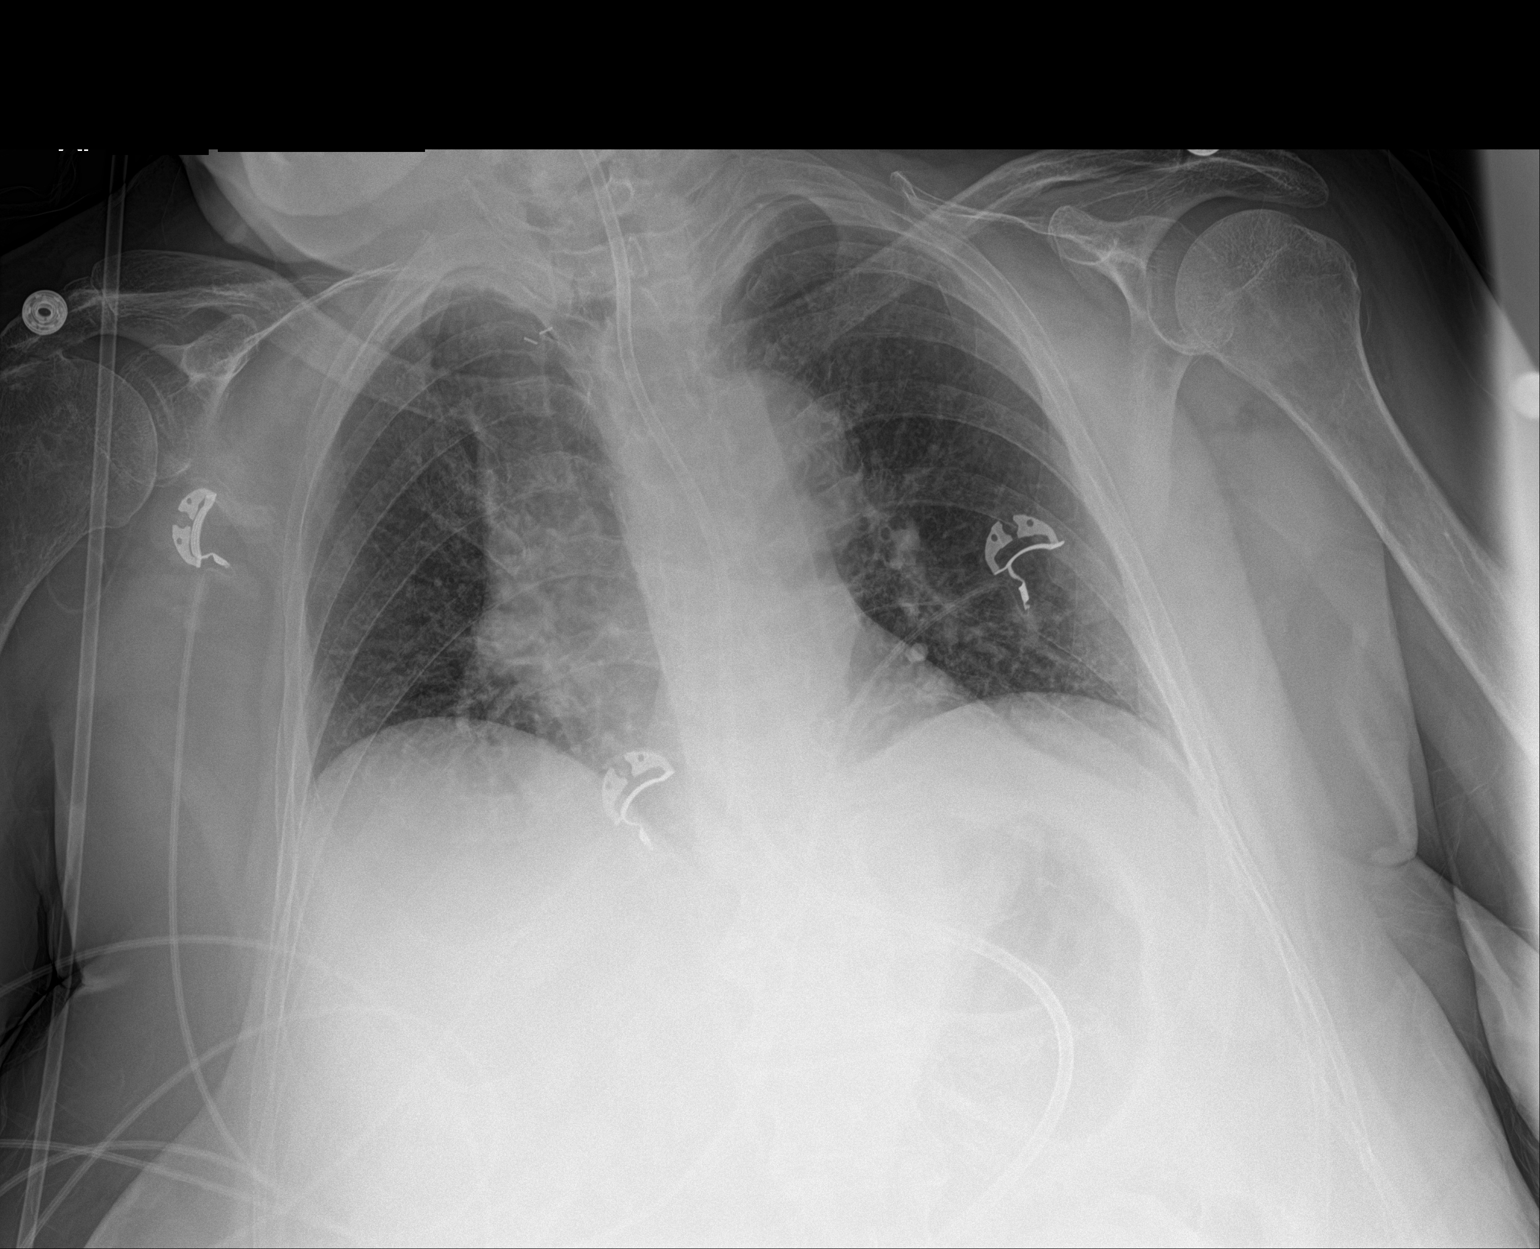

[1 of 1 positions shown; findings below may reference images not displayed]

FINDINGS: Cardiac silhouette is normal in size. No mediastinal or hilar
masses.

Clear lungs.  No convincing pleural effusion and no pneumothorax.

Enteric feeding tube passes through the stomach and below the
included field of view.

Skeletal structures are grossly intact.
IMPRESSION: No acute cardiopulmonary disease.
